# Patient Record
Sex: Female | Born: 1950
Health system: Southern US, Community
[De-identification: ages and names within clinical notes are randomized; demographics above are authoritative.]

## PROBLEM LIST (undated history)

## (undated) DIAGNOSIS — I87309 Chronic venous hypertension (idiopathic) without complications of unspecified lower extremity: Secondary | ICD-10-CM

## (undated) DIAGNOSIS — F3289 Other specified depressive episodes: Secondary | ICD-10-CM

## (undated) DIAGNOSIS — I10 Essential (primary) hypertension: Secondary | ICD-10-CM

## (undated) DIAGNOSIS — I4819 Other persistent atrial fibrillation: Secondary | ICD-10-CM

## (undated) DIAGNOSIS — T451X5A Adverse effect of antineoplastic and immunosuppressive drugs, initial encounter: Secondary | ICD-10-CM

## (undated) DIAGNOSIS — S82891A Other fracture of right lower leg, initial encounter for closed fracture: Secondary | ICD-10-CM

## (undated) DIAGNOSIS — R112 Nausea with vomiting, unspecified: Secondary | ICD-10-CM

## (undated) DIAGNOSIS — N289 Disorder of kidney and ureter, unspecified: Secondary | ICD-10-CM

## (undated) DIAGNOSIS — I5032 Chronic diastolic (congestive) heart failure: Secondary | ICD-10-CM

## (undated) DIAGNOSIS — J45909 Unspecified asthma, uncomplicated: Secondary | ICD-10-CM

## (undated) DIAGNOSIS — E669 Obesity, unspecified: Secondary | ICD-10-CM

## (undated) DIAGNOSIS — D869 Sarcoidosis, unspecified: Secondary | ICD-10-CM

## (undated) DIAGNOSIS — R911 Solitary pulmonary nodule: Secondary | ICD-10-CM

## (undated) DIAGNOSIS — D649 Anemia, unspecified: Secondary | ICD-10-CM

## (undated) DIAGNOSIS — E039 Hypothyroidism, unspecified: Secondary | ICD-10-CM

## (undated) DIAGNOSIS — K589 Irritable bowel syndrome without diarrhea: Secondary | ICD-10-CM

## (undated) DIAGNOSIS — K5730 Diverticulosis of large intestine without perforation or abscess without bleeding: Secondary | ICD-10-CM

## (undated) DIAGNOSIS — M109 Gout, unspecified: Secondary | ICD-10-CM

## (undated) DIAGNOSIS — E785 Hyperlipidemia, unspecified: Secondary | ICD-10-CM

## (undated) DIAGNOSIS — Z91199 Patient's noncompliance with other medical treatment and regimen due to unspecified reason: Secondary | ICD-10-CM

## (undated) DIAGNOSIS — Z90710 Acquired absence of both cervix and uterus: Secondary | ICD-10-CM

## (undated) DIAGNOSIS — G479 Sleep disorder, unspecified: Secondary | ICD-10-CM

## (undated) DIAGNOSIS — E1169 Type 2 diabetes mellitus with other specified complication: Secondary | ICD-10-CM

## (undated) DIAGNOSIS — K759 Inflammatory liver disease, unspecified: Secondary | ICD-10-CM

## (undated) DIAGNOSIS — F329 Major depressive disorder, single episode, unspecified: Secondary | ICD-10-CM

## (undated) DIAGNOSIS — I2510 Atherosclerotic heart disease of native coronary artery without angina pectoris: Secondary | ICD-10-CM

## (undated) DIAGNOSIS — Z9889 Other specified postprocedural states: Secondary | ICD-10-CM

## (undated) DIAGNOSIS — Z9119 Patient's noncompliance with other medical treatment and regimen: Secondary | ICD-10-CM

## (undated) DIAGNOSIS — K635 Polyp of colon: Secondary | ICD-10-CM

## (undated) HISTORY — DX: Polyp of colon: K63.5

## (undated) HISTORY — DX: Patient's noncompliance with other medical treatment and regimen due to unspecified reason: Z91.199

## (undated) HISTORY — DX: Unspecified asthma, uncomplicated: J45.909

## (undated) HISTORY — DX: Chronic venous hypertension (idiopathic) without complications of unspecified lower extremity: I87.309

## (undated) HISTORY — DX: Other fracture of right lower leg, initial encounter for closed fracture: S82.891A

## (undated) HISTORY — DX: Adverse effect of antineoplastic and immunosuppressive drugs, initial encounter: T45.1X5A

## (undated) HISTORY — DX: Irritable bowel syndrome, unspecified: K58.9

## (undated) HISTORY — PX: CHOLECYSTECTOMY: SHX55

## (undated) HISTORY — PX: CARDIOVERSION: SHX1299

## (undated) HISTORY — DX: Sarcoidosis, unspecified: D86.9

## (undated) HISTORY — DX: Atherosclerotic heart disease of native coronary artery without angina pectoris: I25.10

## (undated) HISTORY — DX: Disorder of kidney and ureter, unspecified: N28.9

## (undated) HISTORY — DX: Chronic diastolic (congestive) heart failure: I50.32

## (undated) HISTORY — DX: Essential (primary) hypertension: I10

## (undated) HISTORY — DX: Other specified depressive episodes: F32.89

## (undated) HISTORY — DX: Hyperlipidemia, unspecified: E78.5

## (undated) HISTORY — DX: Anemia, unspecified: D64.9

## (undated) HISTORY — DX: Solitary pulmonary nodule: R91.1

## (undated) HISTORY — PX: ABDOMINAL HYSTERECTOMY: SHX81

## (undated) HISTORY — DX: Obesity, unspecified: E66.9

## (undated) HISTORY — PX: COLONOSCOPY: SHX174

## (undated) HISTORY — DX: Acquired absence of both cervix and uterus: Z90.710

## (undated) HISTORY — DX: Inflammatory liver disease, unspecified: K75.9

## (undated) HISTORY — DX: Gout, unspecified: M10.9

## (undated) HISTORY — DX: Diverticulosis of large intestine without perforation or abscess without bleeding: K57.30

## (undated) HISTORY — DX: Sleep disorder, unspecified: G47.9

## (undated) HISTORY — DX: Patient's noncompliance with other medical treatment and regimen: Z91.19

## (undated) HISTORY — DX: Major depressive disorder, single episode, unspecified: F32.9

## (undated) HISTORY — DX: Other persistent atrial fibrillation: I48.19

---

## 1998-08-11 ENCOUNTER — Emergency Department (HOSPITAL_COMMUNITY): Admission: EM | Admit: 1998-08-11 | Discharge: 1998-08-11 | Payer: Self-pay | Admitting: Emergency Medicine

## 1998-10-19 HISTORY — PX: MYOMECTOMY: SHX85

## 1999-08-04 ENCOUNTER — Encounter (INDEPENDENT_AMBULATORY_CARE_PROVIDER_SITE_OTHER): Payer: Self-pay

## 1999-08-04 ENCOUNTER — Other Ambulatory Visit: Admission: RE | Admit: 1999-08-04 | Discharge: 1999-08-04 | Payer: Self-pay | Admitting: *Deleted

## 1999-09-30 ENCOUNTER — Other Ambulatory Visit: Admission: RE | Admit: 1999-09-30 | Discharge: 1999-09-30 | Payer: Self-pay | Admitting: Obstetrics and Gynecology

## 1999-11-12 ENCOUNTER — Inpatient Hospital Stay (HOSPITAL_COMMUNITY): Admission: AD | Admit: 1999-11-12 | Discharge: 1999-11-12 | Payer: Self-pay | Admitting: Obstetrics and Gynecology

## 2000-04-19 ENCOUNTER — Emergency Department (HOSPITAL_COMMUNITY): Admission: EM | Admit: 2000-04-19 | Discharge: 2000-04-20 | Payer: Self-pay | Admitting: Emergency Medicine

## 2000-04-20 ENCOUNTER — Encounter: Payer: Self-pay | Admitting: Emergency Medicine

## 2000-10-19 DIAGNOSIS — Z90710 Acquired absence of both cervix and uterus: Secondary | ICD-10-CM

## 2000-10-19 HISTORY — DX: Acquired absence of both cervix and uterus: Z90.710

## 2000-11-19 ENCOUNTER — Encounter: Payer: Self-pay | Admitting: Family Medicine

## 2000-11-19 ENCOUNTER — Encounter: Admission: RE | Admit: 2000-11-19 | Discharge: 2000-11-19 | Payer: Self-pay | Admitting: Family Medicine

## 2000-12-31 ENCOUNTER — Other Ambulatory Visit: Admission: RE | Admit: 2000-12-31 | Discharge: 2000-12-31 | Payer: Self-pay | Admitting: Obstetrics and Gynecology

## 2001-01-03 ENCOUNTER — Other Ambulatory Visit: Admission: RE | Admit: 2001-01-03 | Discharge: 2001-01-03 | Payer: Self-pay | Admitting: Obstetrics and Gynecology

## 2001-02-01 ENCOUNTER — Encounter (INDEPENDENT_AMBULATORY_CARE_PROVIDER_SITE_OTHER): Payer: Self-pay | Admitting: Specialist

## 2001-02-01 ENCOUNTER — Other Ambulatory Visit: Admission: RE | Admit: 2001-02-01 | Discharge: 2001-02-01 | Payer: Self-pay | Admitting: Obstetrics and Gynecology

## 2001-02-15 ENCOUNTER — Encounter (INDEPENDENT_AMBULATORY_CARE_PROVIDER_SITE_OTHER): Payer: Self-pay | Admitting: Specialist

## 2001-02-15 ENCOUNTER — Inpatient Hospital Stay (HOSPITAL_COMMUNITY): Admission: RE | Admit: 2001-02-15 | Discharge: 2001-02-17 | Payer: Self-pay | Admitting: Obstetrics and Gynecology

## 2001-07-01 ENCOUNTER — Ambulatory Visit (HOSPITAL_COMMUNITY): Admission: RE | Admit: 2001-07-01 | Discharge: 2001-07-01 | Payer: Self-pay | Admitting: Family Medicine

## 2001-07-01 ENCOUNTER — Encounter: Payer: Self-pay | Admitting: Family Medicine

## 2002-09-25 ENCOUNTER — Encounter: Payer: Self-pay | Admitting: Family Medicine

## 2002-09-25 ENCOUNTER — Ambulatory Visit (HOSPITAL_COMMUNITY): Admission: RE | Admit: 2002-09-25 | Discharge: 2002-09-25 | Payer: Self-pay | Admitting: Family Medicine

## 2004-03-12 ENCOUNTER — Emergency Department (HOSPITAL_COMMUNITY): Admission: EM | Admit: 2004-03-12 | Discharge: 2004-03-12 | Payer: Self-pay | Admitting: Emergency Medicine

## 2005-01-05 ENCOUNTER — Emergency Department (HOSPITAL_COMMUNITY): Admission: EM | Admit: 2005-01-05 | Discharge: 2005-01-05 | Payer: Self-pay | Admitting: Emergency Medicine

## 2005-03-29 ENCOUNTER — Emergency Department (HOSPITAL_COMMUNITY): Admission: EM | Admit: 2005-03-29 | Discharge: 2005-03-29 | Payer: Self-pay | Admitting: *Deleted

## 2005-09-21 ENCOUNTER — Ambulatory Visit (HOSPITAL_COMMUNITY): Admission: RE | Admit: 2005-09-21 | Discharge: 2005-09-21 | Payer: Self-pay | Admitting: Family Medicine

## 2006-02-17 ENCOUNTER — Encounter: Payer: Self-pay | Admitting: Emergency Medicine

## 2006-12-06 ENCOUNTER — Encounter: Admission: RE | Admit: 2006-12-06 | Discharge: 2006-12-06 | Payer: Self-pay | Admitting: Family Medicine

## 2007-07-11 ENCOUNTER — Emergency Department (HOSPITAL_COMMUNITY): Admission: EM | Admit: 2007-07-11 | Discharge: 2007-07-11 | Payer: Self-pay | Admitting: Family Medicine

## 2007-10-20 DIAGNOSIS — K5730 Diverticulosis of large intestine without perforation or abscess without bleeding: Secondary | ICD-10-CM

## 2007-10-20 DIAGNOSIS — K635 Polyp of colon: Secondary | ICD-10-CM

## 2007-10-20 HISTORY — PX: HERNIA REPAIR: SHX51

## 2007-10-20 HISTORY — DX: Polyp of colon: K63.5

## 2007-10-20 HISTORY — DX: Diverticulosis of large intestine without perforation or abscess without bleeding: K57.30

## 2007-11-21 ENCOUNTER — Ambulatory Visit: Payer: Self-pay | Admitting: Gastroenterology

## 2007-11-21 DIAGNOSIS — F329 Major depressive disorder, single episode, unspecified: Secondary | ICD-10-CM

## 2007-11-21 DIAGNOSIS — K589 Irritable bowel syndrome without diarrhea: Secondary | ICD-10-CM | POA: Insufficient documentation

## 2007-11-21 DIAGNOSIS — F3289 Other specified depressive episodes: Secondary | ICD-10-CM | POA: Insufficient documentation

## 2007-11-21 DIAGNOSIS — K759 Inflammatory liver disease, unspecified: Secondary | ICD-10-CM | POA: Insufficient documentation

## 2007-11-21 DIAGNOSIS — I87309 Chronic venous hypertension (idiopathic) without complications of unspecified lower extremity: Secondary | ICD-10-CM | POA: Insufficient documentation

## 2007-11-21 LAB — CONVERTED CEMR LAB
ALT: 22 units/L (ref 0–35)
AST: 21 units/L (ref 0–37)
Albumin: 3.7 g/dL (ref 3.5–5.2)
Alkaline Phosphatase: 69 units/L (ref 39–117)
Bilirubin, Direct: 0.1 mg/dL (ref 0.0–0.3)
HCV Ab: NEGATIVE

## 2007-12-26 ENCOUNTER — Ambulatory Visit: Payer: Self-pay | Admitting: Gastroenterology

## 2007-12-26 ENCOUNTER — Encounter: Payer: Self-pay | Admitting: Gastroenterology

## 2007-12-26 LAB — HM COLONOSCOPY

## 2008-01-02 ENCOUNTER — Inpatient Hospital Stay (HOSPITAL_COMMUNITY): Admission: RE | Admit: 2008-01-02 | Discharge: 2008-01-04 | Payer: Self-pay | Admitting: General Surgery

## 2008-10-20 ENCOUNTER — Emergency Department (HOSPITAL_COMMUNITY): Admission: EM | Admit: 2008-10-20 | Discharge: 2008-10-20 | Payer: Self-pay | Admitting: Family Medicine

## 2008-11-03 ENCOUNTER — Emergency Department (HOSPITAL_COMMUNITY): Admission: EM | Admit: 2008-11-03 | Discharge: 2008-11-03 | Payer: Self-pay | Admitting: Family Medicine

## 2009-08-01 ENCOUNTER — Inpatient Hospital Stay (HOSPITAL_COMMUNITY): Admission: EM | Admit: 2009-08-01 | Discharge: 2009-08-05 | Payer: Self-pay | Admitting: Emergency Medicine

## 2009-08-01 ENCOUNTER — Ambulatory Visit: Payer: Self-pay | Admitting: Internal Medicine

## 2009-08-02 ENCOUNTER — Encounter (INDEPENDENT_AMBULATORY_CARE_PROVIDER_SITE_OTHER): Payer: Self-pay | Admitting: Internal Medicine

## 2009-08-20 ENCOUNTER — Telehealth (INDEPENDENT_AMBULATORY_CARE_PROVIDER_SITE_OTHER): Payer: Self-pay | Admitting: *Deleted

## 2009-08-20 DIAGNOSIS — E669 Obesity, unspecified: Secondary | ICD-10-CM | POA: Insufficient documentation

## 2009-08-20 DIAGNOSIS — E785 Hyperlipidemia, unspecified: Secondary | ICD-10-CM | POA: Insufficient documentation

## 2009-08-20 DIAGNOSIS — D649 Anemia, unspecified: Secondary | ICD-10-CM | POA: Insufficient documentation

## 2009-08-20 DIAGNOSIS — D869 Sarcoidosis, unspecified: Secondary | ICD-10-CM | POA: Insufficient documentation

## 2009-08-20 DIAGNOSIS — I4891 Unspecified atrial fibrillation: Secondary | ICD-10-CM | POA: Insufficient documentation

## 2009-08-20 DIAGNOSIS — R911 Solitary pulmonary nodule: Secondary | ICD-10-CM | POA: Insufficient documentation

## 2009-08-20 DIAGNOSIS — R0602 Shortness of breath: Secondary | ICD-10-CM | POA: Insufficient documentation

## 2009-08-21 ENCOUNTER — Encounter (HOSPITAL_COMMUNITY): Admission: RE | Admit: 2009-08-21 | Discharge: 2009-10-15 | Payer: Self-pay | Admitting: Internal Medicine

## 2009-08-21 ENCOUNTER — Encounter: Payer: Self-pay | Admitting: Cardiology

## 2009-08-21 ENCOUNTER — Ambulatory Visit: Payer: Self-pay | Admitting: Cardiovascular Disease

## 2009-08-21 ENCOUNTER — Ambulatory Visit: Payer: Self-pay

## 2009-08-21 DIAGNOSIS — R9439 Abnormal result of other cardiovascular function study: Secondary | ICD-10-CM | POA: Insufficient documentation

## 2009-08-21 DIAGNOSIS — R079 Chest pain, unspecified: Secondary | ICD-10-CM | POA: Insufficient documentation

## 2009-08-22 ENCOUNTER — Inpatient Hospital Stay (HOSPITAL_BASED_OUTPATIENT_CLINIC_OR_DEPARTMENT_OTHER): Admission: RE | Admit: 2009-08-22 | Discharge: 2009-08-22 | Payer: Self-pay | Admitting: Cardiology

## 2009-08-22 ENCOUNTER — Ambulatory Visit: Payer: Self-pay | Admitting: Cardiology

## 2009-08-23 LAB — CONVERTED CEMR LAB
Basophils Relative: 0.6 % (ref 0.0–3.0)
CO2: 28 meq/L (ref 19–32)
Calcium: 9.7 mg/dL (ref 8.4–10.5)
Chloride: 101 meq/L (ref 96–112)
Eosinophils Absolute: 0.1 10*3/uL (ref 0.0–0.7)
HCT: 32.6 % — ABNORMAL LOW (ref 36.0–46.0)
Hemoglobin: 11.2 g/dL — ABNORMAL LOW (ref 12.0–15.0)
Lymphocytes Relative: 46.9 % — ABNORMAL HIGH (ref 12.0–46.0)
Lymphs Abs: 2.8 10*3/uL (ref 0.7–4.0)
MCHC: 34.4 g/dL (ref 30.0–36.0)
Neutro Abs: 2.7 10*3/uL (ref 1.4–7.7)
Potassium: 4 meq/L (ref 3.5–5.1)
RBC: 3.38 M/uL — ABNORMAL LOW (ref 3.87–5.11)
Sodium: 137 meq/L (ref 135–145)
aPTT: 26.3 s (ref 21.7–28.8)

## 2009-08-31 ENCOUNTER — Emergency Department (HOSPITAL_COMMUNITY): Admission: EM | Admit: 2009-08-31 | Discharge: 2009-08-31 | Payer: Self-pay | Admitting: Emergency Medicine

## 2009-09-02 ENCOUNTER — Ambulatory Visit: Payer: Self-pay | Admitting: Physician Assistant

## 2009-09-02 DIAGNOSIS — I10 Essential (primary) hypertension: Secondary | ICD-10-CM | POA: Insufficient documentation

## 2009-09-02 DIAGNOSIS — F411 Generalized anxiety disorder: Secondary | ICD-10-CM | POA: Insufficient documentation

## 2009-09-02 DIAGNOSIS — E739 Lactose intolerance, unspecified: Secondary | ICD-10-CM | POA: Insufficient documentation

## 2009-09-02 LAB — CONVERTED CEMR LAB
Bilirubin Urine: NEGATIVE
Glucose, Urine, Semiquant: NEGATIVE
Hgb A1c MFr Bld: 6.1 %
Protein, U semiquant: NEGATIVE
Specific Gravity, Urine: >=1.03
pH: 5

## 2009-09-03 ENCOUNTER — Telehealth: Payer: Self-pay | Admitting: Physician Assistant

## 2009-09-03 ENCOUNTER — Encounter: Payer: Self-pay | Admitting: Physician Assistant

## 2009-09-06 ENCOUNTER — Telehealth (INDEPENDENT_AMBULATORY_CARE_PROVIDER_SITE_OTHER): Payer: Self-pay | Admitting: *Deleted

## 2009-09-09 ENCOUNTER — Encounter: Payer: Self-pay | Admitting: Internal Medicine

## 2009-09-09 ENCOUNTER — Encounter: Payer: Self-pay | Admitting: Physician Assistant

## 2009-09-09 LAB — CONVERTED CEMR LAB
Basophils Absolute: 0 10*3/uL (ref 0.0–0.1)
Basophils Relative: 0 % (ref 0–1)
MCHC: 32.3 g/dL (ref 30.0–36.0)
Neutro Abs: 3.2 10*3/uL (ref 1.7–7.7)
Neutrophils Relative %: 45 % (ref 43–77)
Platelets: 246 10*3/uL (ref 150–400)
RDW: 14.3 % (ref 11.5–15.5)
Retic Ct Pct: 2.4 % (ref 0.4–3.1)
TIBC: 377 ug/dL (ref 250–470)
UIBC: 315 ug/dL

## 2009-09-10 ENCOUNTER — Ambulatory Visit: Payer: Self-pay | Admitting: Internal Medicine

## 2009-09-24 ENCOUNTER — Telehealth (INDEPENDENT_AMBULATORY_CARE_PROVIDER_SITE_OTHER): Payer: Self-pay | Admitting: *Deleted

## 2009-09-25 LAB — CONVERTED CEMR LAB

## 2009-09-27 ENCOUNTER — Ambulatory Visit: Payer: Self-pay | Admitting: Physician Assistant

## 2009-09-27 DIAGNOSIS — I5032 Chronic diastolic (congestive) heart failure: Secondary | ICD-10-CM | POA: Insufficient documentation

## 2009-10-01 ENCOUNTER — Telehealth: Payer: Self-pay | Admitting: Physician Assistant

## 2009-10-09 ENCOUNTER — Telehealth: Payer: Self-pay | Admitting: Physician Assistant

## 2009-10-23 ENCOUNTER — Ambulatory Visit: Payer: Self-pay | Admitting: Physician Assistant

## 2009-10-23 DIAGNOSIS — K219 Gastro-esophageal reflux disease without esophagitis: Secondary | ICD-10-CM | POA: Insufficient documentation

## 2009-10-23 LAB — CONVERTED CEMR LAB
CO2: 24 meq/L (ref 19–32)
Chloride: 102 meq/L (ref 96–112)
Creatinine, Ser: 0.92 mg/dL (ref 0.40–1.20)
Eosinophils Absolute: 0.1 10*3/uL (ref 0.0–0.7)
HCT: 37.9 % (ref 36.0–46.0)
Hgb A2 Quant: 2.5 % (ref 2.2–3.2)
Hgb A: 97.5 % (ref 96.8–97.8)
Hgb F Quant: 0 % (ref 0.0–2.0)
Lymphs Abs: 2.2 10*3/uL (ref 0.7–4.0)
MCV: 95 fL (ref 78.0–100.0)
Monocytes Relative: 6 % (ref 3–12)
Neutrophils Relative %: 52 % (ref 43–77)
Potassium: 4.1 meq/L (ref 3.5–5.3)
Pro B Natriuretic peptide (BNP): 16 pg/mL (ref 0.0–100.0)
RBC: 3.99 M/uL (ref 3.87–5.11)
WBC: 5.4 10*3/uL (ref 4.0–10.5)

## 2009-10-25 ENCOUNTER — Telehealth: Payer: Self-pay | Admitting: Physician Assistant

## 2009-10-25 ENCOUNTER — Encounter: Payer: Self-pay | Admitting: Physician Assistant

## 2009-10-25 ENCOUNTER — Ambulatory Visit (HOSPITAL_COMMUNITY): Admission: RE | Admit: 2009-10-25 | Discharge: 2009-10-25 | Payer: Self-pay | Admitting: Internal Medicine

## 2009-10-25 ENCOUNTER — Encounter: Payer: Self-pay | Admitting: Pulmonary Disease

## 2009-10-25 DIAGNOSIS — J189 Pneumonia, unspecified organism: Secondary | ICD-10-CM | POA: Insufficient documentation

## 2009-11-06 ENCOUNTER — Ambulatory Visit: Payer: Self-pay | Admitting: Physician Assistant

## 2009-11-06 DIAGNOSIS — J45909 Unspecified asthma, uncomplicated: Secondary | ICD-10-CM | POA: Insufficient documentation

## 2009-11-14 ENCOUNTER — Telehealth: Payer: Self-pay | Admitting: Physician Assistant

## 2009-11-15 ENCOUNTER — Encounter: Payer: Self-pay | Admitting: Physician Assistant

## 2009-11-15 ENCOUNTER — Ambulatory Visit (HOSPITAL_COMMUNITY): Admission: RE | Admit: 2009-11-15 | Discharge: 2009-11-15 | Payer: Self-pay | Admitting: Physician Assistant

## 2009-11-29 ENCOUNTER — Telehealth: Payer: Self-pay | Admitting: Physician Assistant

## 2010-01-20 ENCOUNTER — Telehealth: Payer: Self-pay | Admitting: Physician Assistant

## 2010-01-20 ENCOUNTER — Ambulatory Visit: Payer: Self-pay | Admitting: Physician Assistant

## 2010-03-03 ENCOUNTER — Ambulatory Visit: Payer: Self-pay | Admitting: Pulmonary Disease

## 2010-03-03 DIAGNOSIS — G4719 Other hypersomnia: Secondary | ICD-10-CM | POA: Insufficient documentation

## 2010-04-22 ENCOUNTER — Encounter: Payer: Self-pay | Admitting: Physician Assistant

## 2010-07-09 ENCOUNTER — Emergency Department (HOSPITAL_COMMUNITY): Admission: EM | Admit: 2010-07-09 | Discharge: 2010-07-09 | Payer: Self-pay | Admitting: Emergency Medicine

## 2010-07-29 ENCOUNTER — Emergency Department (HOSPITAL_COMMUNITY): Admission: EM | Admit: 2010-07-29 | Discharge: 2010-07-29 | Payer: Self-pay | Admitting: Emergency Medicine

## 2010-07-29 ENCOUNTER — Emergency Department (HOSPITAL_COMMUNITY): Admission: EM | Admit: 2010-07-29 | Discharge: 2010-07-29 | Payer: Self-pay | Admitting: Family Medicine

## 2010-07-30 ENCOUNTER — Telehealth: Payer: Self-pay | Admitting: Cardiology

## 2010-08-25 ENCOUNTER — Encounter (INDEPENDENT_AMBULATORY_CARE_PROVIDER_SITE_OTHER): Payer: Self-pay | Admitting: *Deleted

## 2010-08-29 ENCOUNTER — Telehealth (INDEPENDENT_AMBULATORY_CARE_PROVIDER_SITE_OTHER): Payer: Self-pay | Admitting: Nurse Practitioner

## 2010-08-29 ENCOUNTER — Ambulatory Visit: Payer: Self-pay | Admitting: Physician Assistant

## 2010-09-05 ENCOUNTER — Encounter (INDEPENDENT_AMBULATORY_CARE_PROVIDER_SITE_OTHER): Payer: Self-pay | Admitting: *Deleted

## 2010-09-09 LAB — CONVERTED CEMR LAB
BUN: 21 mg/dL (ref 6–23)
Calcium: 9.3 mg/dL (ref 8.4–10.5)
Creatinine, Ser: 1 mg/dL (ref 0.4–1.2)
GFR calc non Af Amer: 74.91 mL/min (ref >60)
Glucose, Bld: 100 mg/dL — ABNORMAL HIGH (ref 70–99)
Sodium: 142 meq/L (ref 135–145)

## 2010-09-17 ENCOUNTER — Ambulatory Visit: Payer: Self-pay | Admitting: Physician Assistant

## 2010-09-22 ENCOUNTER — Ambulatory Visit: Payer: Self-pay | Admitting: Internal Medicine

## 2010-09-22 ENCOUNTER — Encounter: Payer: Self-pay | Admitting: Physician Assistant

## 2010-09-25 LAB — CONVERTED CEMR LAB
BUN: 22 mg/dL (ref 6–23)
Calcium: 9.4 mg/dL (ref 8.4–10.5)
Creatinine, Ser: 1 mg/dL (ref 0.4–1.2)
Pro B Natriuretic peptide (BNP): 158.7 pg/mL — ABNORMAL HIGH (ref 0.0–100.0)

## 2010-11-11 ENCOUNTER — Ambulatory Visit: Admit: 2010-11-11 | Payer: Self-pay | Admitting: Internal Medicine

## 2010-11-18 NOTE — Progress Notes (Signed)
Summary: Office Visit/DEPRESSION SCREENING  Office Visit/DEPRESSION SCREENING   Imported By: Arta Bruce 10/22/2009 15:00:55  _____________________________________________________________________  External Attachment:    Type:   Image     Comment:   External Document

## 2010-11-18 NOTE — Assessment & Plan Note (Signed)
Summary: sarcoid/jd   Visit Type:  Initial Consult Copy to:  pcp Primary Provider/Referring Provider:  Tereso Newcomer PA-C  CC:  Pt here for pulmonary consult. Pt c/o history of PNA. 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 She was diagnosed with sarcoidosis in 1984 by an ophthalmologist in [place], [place] when she presented with photophobia & uveitis. She was on prednisone x 1 yr then off. She was treated with pneumonia in 10/10, FU in 1/11 showed 12 mm RLL nodule resolved & small patch of pneumonitis in RUL. Lisinopril was stopped in 4/11. Spirometry  11/15/09 showed nml lung function. She had nml cath in 10/10 & required DCCV for atrial fibrillation, is currently maintaining nSR on cardizem & flecainide. She denies chest pain, palpitations, cough . She reports recent onset snoring , hot flashes & insomnia for which she is taking xanax & nonrefreshing sleep. No witnessed apneas   Preventive Screening-Counseling & Management  Alcohol-Tobacco     Smoking Status: never  Current Medications (verified): 1)  Furosemide 40 Mg Tabs (Furosemide) .... Take One Daily 2)  Simvastatin 20 Mg Tabs (Simvastatin) .... Take One Half Daily 3)  Cardizem Cd 360 Mg Xr24h-Cap (Diltiazem Hcl Coated Beads) .... Take 1 Tablet By Mouth Once A Day 4)  Tambocor 100 Mg Tabs (Flecainide Acetate) .... Take One Every 12 Hours 5)  Pepcid 20 Mg Tabs (Famotidine) .... Take 1 Tablet By Mouth Once A Day 6)  Allegra 180 Mg Tabs (Fexofenadine Hcl) .... Take 1 Tablet By Mouth Once A Day For Allergies 7)  Alprazolam 0.25 Mg Tabs (Alprazolam) .... Take 1 Tablet By Mouth Two Times A Day As Needed For Anxiety 8)  Diovan 160 Mg Tabs (Valsartan) .... Take 1 Tablet By Mouth Once A Day For Blood Pressure  Allergies (verified): 1)  ! Darvocet 2)  ! Lipitor  Past History:  Past Medical History: Last updated: 01/20/2010 abnormal Myoview scan with anterior  ischemia  cardiac catheterization 10/10 no obstructive coronary disease CHF (ICD-428.0)    a.  normal LVF by echo 07/2009 with mild LVH (?diastolic CHF) ATRIAL FIBRILLATION (ICD-427.31)    a.  s/p DCCV 07/2009 DYSLIPIDEMIA (ICD-272.4) CHRONIC VENOUS HYPERTENSION WITHOUT COMPS (ICD-459.30) SARCOIDOSIS (ICD-135)    a.  dx by eye exam in past OBESITY (ICD-278.00) ANEMIA (ICD-285.9) LUNG NODULE (ICD-518.89)    a. 12mm RLL nodule on chest CT 07/2009    b.  needs f/u chest CT January 2011 . . . resolved on f/u CT I B S-DIARRHEAL PREDOMINATE (ICD-564.1) Hx of UNSPECIFIED HEPATITIS (ICD-573.3) DEPRESSIVE DISORDER NOT ELSEWHERE CLASSIFIED (ICD-311) ? Chronic bronchitis.  Colon polyps    a.  had colonoscopy with Dr. Arlyce Dice in 2009 (patient told to have f/u in 2012) Anemia Hypertension Glucose intolerance (hgb A1C 6.1 09/02/2009) PFTs 11/15/2009:  FEV1 95; FEV1/FVC 83; (Normal airflow, no change with bronchodilator)  Past Surgical History: Last updated: 08/20/2009 Cholecystectomy   DC cardioversion x2.    Hernia repair 2009.    Hysterectomy 2002,    History right ankle fractures x2.  Family History:  Father died of pneumonia.  Mother died with cancer and   had a history of multiple sclerosis.  She states that emphysema runs in  her family.     Family History Hypertension (father)  Social History: The patient is divorced.  She works as a Proofreader.  No tobacco. Substantiall alcohol intake, sometimes 1 bottle wine/day.  No recreational  drugs.   Today (09/02/2009), patient notes 2-3 drinks 1-2 x per week. Patient never smoked.   Review of Systems       The patient complains of shortness of breath with activity, irregular heartbeats, weight change, abdominal pain, anxiety, depression, hand/feet swelling, and joint stiffness or pain.  The patient denies shortness of breath at rest, productive cough, non-productive cough, coughing up blood, chest pain, acid heartburn,  indigestion, loss of appetite, difficulty swallowing, sore throat, tooth/dental problems, headaches, nasal congestion/difficulty breathing through nose, sneezing, itching, ear ache, rash, change in color of mucus, and fever.    Vital Signs:  Patient profile:   60 year old female Height:      65 inches Weight:      260 pounds O2 Sat:      97 % on Room air Temp:     98.1 degrees F oral Pulse rate:   69 / minute BP sitting:   158 / 100  (right arm) Cuff size:   large  Vitals Entered By: Zackery Barefoot CMA (Mar 03, 2010 4:31 PM)  O2 Flow:  Room air CC: Pt here for pulmonary consult. Pt c/o history of PNA.  Comments Medications reviewed with patient Verified contact number and pharmacy with patient Zackery Barefoot CMA  Mar 03, 2010 4:31 PM    Physical Exam  Additional Exam:  Gen. Pleasant, well-nourished, in no distress, normal affect ENT - no lesions, no post nasal drip, class 2 airway Neck: No JVD, no thyromegaly, no carotid bruits Lungs: no use of accessory muscles, no dullness to percussion, clear without rales or rhonchi  Cardiovascular: Rhythm regular, heart sounds  normal, no murmurs or gallops, no peripheral edema Abdomen: soft and non-tender, no hepatosplenomegaly, BS normal. Musculoskeletal: No deformities, no cyanosis or clubbing Neuro:  alert, non focal     Impression & Recommendations:  Problem # 1:  SARCOIDOSIS (ICD-135) With nml lung function & imaging , does not need treatment.  Problem # 2:  REACTIVE AIRWAY DISEASE (ICD-493.90) likley related to lisinopril - agree with stopping this & staying off.  Problem # 3:  HYPERSOMNIA, TRANSIENT (ICD-307.43)  may be related to menopause or true obstructive sleep apnea  - discussed warning signs to seek evaluation  Orders: Consultation Level IV (95621)  Medications Added to Medication List This Visit: 1)  Pepcid 20 Mg Tabs (Famotidine) .... Take 1 tablet by mouth once a day  Patient Instructions: 1)  Please  schedule a follow-up appointment as needed. 2)  We discussed warning signs of obstructive sleep apnea  3)  You do not need treatment for sarcoidosis

## 2010-11-18 NOTE — Progress Notes (Signed)
Summary: Office Visit//DEPRESSION SCREENING  Office Visit//DEPRESSION SCREENING   Imported By: Arta Bruce 01/02/2010 15:00:15  _____________________________________________________________________  External Attachment:    Type:   Image     Comment:   External Document

## 2010-11-18 NOTE — Assessment & Plan Note (Signed)
Summary: f3w per check out on 11/11/lg  Medications Added PEPCID 20 MG TABS (FAMOTIDINE) Take 1 tablet by mouth once a day as needed DILTIAZEM HCL ER BEADS 180 MG XR24H-CAP (DILTIAZEM HCL ER BEADS) Take one capsule by mouth daily ALPRAZOLAM 0.25 MG TABS (ALPRAZOLAM) pt states since dose is so low she takes 2-3 tabs at bedtime as needed        Visit Type:  3 wk f/u Referring Provider:  pcp Primary Provider:  Tereso Newcomer PA-C  CC:  sob...edema.....  History of Present Illness: Primary Electrophysiologist:  Dr. Sherryl Manges  Stephanie [name] is a 60 year old female who is a prior patient of mine from [facility] with a history of atrial fibrillation, hypertension, diastolic heart failure, normal coronary arteries by cardiac catheterization in November 2010 who was recently seen in the emergency room.  She was in atrial fibrillation with rapid ventricular rate.  She was also noted to be in mild diastolic congestive heart failure.  She was treated with p.o. diuretics.  She was previously placed on flecainide, but has not been taking any of her medications due to cost.  I saw her on Nov. 11 and restarted her diltiazem and increased her furosemide. She returns for followup.    She is feeling better.  Her breathing is better.  She denies orthopnea or PND.  She notes her palpitations are less frequent.  She is not having chest pain.  She is requesting xanax again.  Anxiety is a big problem for her.  She is having [facility].  She has been told to contact them for a refill.  Her  BP looks much better.  She admits to compliance with her medications.     Current Medications (verified): 1)  Furosemide 40 Mg Tabs (Furosemide) .... Take One Daily 2)  Pepcid 20 Mg Tabs (Famotidine) .... Take 1 Tablet By Mouth Once A Day As Needed 3)  Dilt-Cd 120 Mg Xr24h-Cap (Diltiazem Hcl Coated Beads) .Marland Kitchen.. 1 Cap Once Daily 4)  Alprazolam 0.25 Mg Tabs (Alprazolam) .... Pt States Since Dose Is So Low  She Takes 2-3 Tabs At Bedtime As Needed  Allergies: 1)  ! Darvocet 2)  ! Lipitor  Past History:  Past Medical History: Last updated: 01/20/2010 abnormal Myoview scan with anterior ischemia  cardiac catheterization 10/10 no obstructive coronary disease CHF (ICD-428.0)    a.  normal LVF by echo 07/2009 with mild LVH (?diastolic CHF) ATRIAL FIBRILLATION (ICD-427.31)    a.  s/p DCCV 07/2009 DYSLIPIDEMIA (ICD-272.4) CHRONIC VENOUS HYPERTENSION WITHOUT COMPS (ICD-459.30) SARCOIDOSIS (ICD-135)    a.  dx by eye exam in past OBESITY (ICD-278.00) ANEMIA (ICD-285.9) LUNG NODULE (ICD-518.89)    a. 12mm RLL nodule on chest CT 07/2009    b.  needs f/u chest CT January 2011 . . . resolved on f/u CT I B S-DIARRHEAL PREDOMINATE (ICD-564.1) Hx of UNSPECIFIED HEPATITIS (ICD-573.3) DEPRESSIVE DISORDER NOT ELSEWHERE CLASSIFIED (ICD-311) ? Chronic bronchitis.  Colon polyps    a.  had colonoscopy with Dr. Arlyce Dice in 2009 (patient told to have f/u in 2012) Anemia Hypertension Glucose intolerance (hgb A1C 6.1 09/02/2009) PFTs 11/15/2009:  FEV1 95; FEV1/FVC 83; (Normal airflow, no change with bronchodilator)  Vital Signs:  Patient profile:   60 year old female Height:      65 inches Weight:      256.50 pounds BMI:     42.84 Pulse rate:   68 / minute Pulse rhythm:   regular BP sitting:   138 /  82  (left arm) Cuff size:   large  Vitals Entered By: Danielle Rankin, CMA (September 22, 2010 10:17 AM)  Physical Exam  General:  Well nourished, well developed, in no acute distress HEENT: normal Neck: no JVD Cardiac:  normal S1, S2; irreg irreg; no murmur Lungs:  clear to auscultation bilaterally, no wheezing, rhonchi or rales Abd: soft, nontender, no hepatomegaly Ext: no edema Skin: warm and dry Neuro:  CNs 2-12 intact, no focal abnormalities noted    Impression & Recommendations:  Problem # 1:  CHRONIC DIASTOLIC HEART FAILURE (ICD-428.32)  Improved with better blood pressure  control. Volume appears stable. Last BNP was in the 500s. Check bmet and bnp today.  Orders: TLB-BMP (Basic Metabolic Panel-BMET) (80048-METABOL) TLB-BNP (B-Natriuretic Peptide) (83880-BNPR)  Problem # 2:  ATRIAL FIBRILLATION (ICD-427.31) She is in recurrent AFib today in the office with controlled rate. Discussed with Dr. Graciela Husbands. We discussed how recent evidence indicates ASA may be more dangerous than not taking anything for stroke prevention (when someone is not on coumadin). Therefore, we will not recommend ASA to her. Her rate is controlled.  Her noncompliance with medications makes recommending flecainide again problematic.  Also, she is not a candidate for coumadin with noncompliance. After further review with Dr. Graciela Husbands, I will continue her on diltiazem and increase her dose to 180 mg once daily. Follow up with Dr. Graciela Husbands.  Problem # 3:  ANXIETY STATE, UNSPECIFIED (ICD-300.00) I have asked her to follow up with her PCP.  Problem # 4:  HYPERTENSION (ICD-401.9)  Much better control. Will increase diltiazem to 180 mg once daily.  Orders: TLB-BMP (Basic Metabolic Panel-BMET) (80048-METABOL)  Patient Instructions: 1)  Your physician recommends that you schedule a follow-up appointment in: 6 weeks with Dr. Graciela Husbands 2)  Your physician recommends that you return for lab work ZO:XWRUE: BMET,BNP. 3)  Your physician has recommended you make the following change in your medication: Ditiazem 180 mg. Take one tablet by mouth once a day. Prescriptions: DILTIAZEM HCL ER BEADS 180 MG XR24H-CAP (DILTIAZEM HCL ER BEADS) Take one capsule by mouth daily  #30 x 8   Entered by:   Ollen Gross, RN, BSN   Authorized by:   Tereso Newcomer PA-C   Signed by:   Ollen Gross, RN, BSN on 09/22/2010   Method used:   Electronically to        [facility] [address] (retail)       [phone]       [place], [place]  [address]       Ph: [phone]       Fax: [phone]    RxID:   [id number]  I have personally reviewed the prescriptions today for accuracy.Tereso Newcomer PA-C  September 22, 2010 1:43 PM   Appended Document: f3w per check out on 11/11/lg    Clinical Lists Changes  Observations: Added new observation of EKG INTERP: AFib with HR 86 LVH NSSTTW changes (09/22/2010 13:45)       EKG  Procedure date:  09/22/2010  Findings:      AFib with HR 86 LVH NSSTTW changes

## 2010-11-18 NOTE — Assessment & Plan Note (Signed)
Summary: 2 month f/u bp /tmm   Vital Signs:  Patient profile:   60 year old female Weight:      266 pounds BMI:     44.42 O2 Sat:      96 % on Room air Temp:     98.6 degrees F Pulse rate:   74 / minute Pulse rhythm:   regular Resp:     20 per minute BP sitting:   165 / 89  (right arm)  Vitals Entered By: Chauncy Passy, SMA  O2 Flow:  Room air CC: Pt. is here for a 2 month f/u for BP. Pt. states she is still wheezing constantly. , Hypertension Management Is Patient Diabetic? No Pain Assessment Patient in pain? no       Does patient need assistance? Functional Status Self care Ambulation Normal Comments PF: 290 - 320 - 320   Primary Care Provider:  Tereso Newcomer PA-C  CC:  Pt. is here for a 2 month f/u for BP. Pt. states she is still wheezing constantly.  and Hypertension Management.  History of Present Illness: Here for f/u. Still having DOE and wheezing.  Not using proventil due to palpitations.  NYHA Class 2b.  No orthopnea.  No PND.  No syncope or chest pain. Has a h/o sarcoid years ago.  States she was on prednisone at one time. Recent CT without mention of hilar adenopathy. Not a smoker. Recent PFTs normal. Never got allegra.  She does have a lot of sneezing and sinus drainage.  HTN:  Taking all meds.  Started Lisinopril in Jan.  Not sure if wheezing any worse.  Notes a recent cough. No production.    Anxiety:  Does not want to take zoloft.  Never got xanax.  Rx was faxed to health dept.  AF:  She has occ. palps.  I think she misses doses of flecainide.  She does have a h/o PAC/PVCs.    Hypertension History:      She complains of palpitations and dyspnea with exertion, but denies chest pain, orthopnea, PND, and neurologic problems.        Positive major cardiovascular risk factors include female age 38 years old or older, hyperlipidemia, and hypertension.  Negative major cardiovascular risk factors include non-tobacco-user status.        Positive history  for target organ damage include cardiac end organ damage (either CHF or LVH).     Habits & Providers  Alcohol-Tobacco-Diet     Tobacco Status: never  Current Medications (verified): 1)  Lisinopril 10 Mg Tabs (Lisinopril) .... Take 1 Tablet By Mouth Once A Day 2)  Furosemide 40 Mg Tabs (Furosemide) .... Take One Daily 3)  Simvastatin 20 Mg Tabs (Simvastatin) .... Take One Half Daily 4)  Cardizem Cd 360 Mg Xr24h-Cap (Diltiazem Hcl Coated Beads) .... Take 1 Tablet By Mouth Once A Day 5)  Tambocor 100 Mg Tabs (Flecainide Acetate) .... Take One Every 12 Hours 6)  Miralax  Powd (Polyethylene Glycol 3350) .Marland Kitchen.. 1 Capful Daily 7)  Aspir-Low 81 Mg Tbec (Aspirin) .... Take 1 Tablet By Mouth Once A Day 8)  Pepcid 20 Mg Tabs (Famotidine) .... Take 1 Tablet By Mouth Two Times A Day 9)  Allegra 180 Mg Tabs (Fexofenadine Hcl) .... Take 1 Tablet By Mouth Once A Day For Allergies 10)  Alprazolam 0.25 Mg Tabs (Alprazolam) .... Take 1 Tablet By Mouth Two Times A Day As Needed For Anxiety  Allergies (verified): 1)  ! Darvocet 2)  !  Lipitor  Past History:  Past Medical History: abnormal Myoview scan with anterior ischemia  cardiac catheterization 10/10 no obstructive coronary disease CHF (ICD-428.0)    a.  normal LVF by echo 07/2009 with mild LVH (?diastolic CHF) ATRIAL FIBRILLATION (ICD-427.31)    a.  s/p DCCV 07/2009 DYSLIPIDEMIA (ICD-272.4) CHRONIC VENOUS HYPERTENSION WITHOUT COMPS (ICD-459.30) SARCOIDOSIS (ICD-135)    a.  dx by eye exam in past OBESITY (ICD-278.00) ANEMIA (ICD-285.9) LUNG NODULE (ICD-518.89)    a. 12mm RLL nodule on chest CT 07/2009    b.  needs f/u chest CT January 2011 . . . resolved on f/u CT I B S-DIARRHEAL PREDOMINATE (ICD-564.1) Hx of UNSPECIFIED HEPATITIS (ICD-573.3) DEPRESSIVE DISORDER NOT ELSEWHERE CLASSIFIED (ICD-311) ? Chronic bronchitis.  Colon polyps    a.  had colonoscopy with Dr. Arlyce Dice in 2009 (patient told to have f/u in  2012) Anemia Hypertension Glucose intolerance (hgb A1C 6.1 09/02/2009) PFTs 11/15/2009:  FEV1 95; FEV1/FVC 83; (Normal airflow, no change with bronchodilator)  Physical Exam  General:  alert, well-developed, and well-nourished.   Head:  normocephalic and atraumatic.   Eyes:  pupils equal, pupils round, and pupils reactive to light.   Mouth:  pharynx pink and moist.   Neck:  supple.   Lungs:  normal breath sounds.   Heart:  normal S1 and S2 RRR with premature beats noted no murmur  Neurologic:  alert & oriented X3 and cranial nerves II-XII intact.   Psych:  normally interactive.     Impression & Recommendations:  Problem # 1:  HYPERTENSION (ICD-401.9) uncontrolled ? if ACE causing her to have cough and wheezing will d/c lisinopril and start diovan 160 mg once daily  The following medications were removed from the medication list:    Lisinopril 10 Mg Tabs (Lisinopril) .Marland Kitchen... Take 1 tablet by mouth once a day Her updated medication list for this problem includes:    Furosemide 40 Mg Tabs (Furosemide) .Marland Kitchen... Take one daily    Cardizem Cd 360 Mg Xr24h-cap (Diltiazem hcl coated beads) .Marland Kitchen... Take 1 tablet by mouth once a day    Diovan 160 Mg Tabs (Valsartan) .Marland Kitchen... Take 1 tablet by mouth once a day for blood pressure  Problem # 2:  SHORTNESS OF BREATH (ICD-786.05)  has a lot of dyspnea and wheezing neg heart cath last year normal LVF recent normal PFTs nonsmoker has a h/o sarcoid.  . . will refer to pulmonary (?if she needs to be on prednisone)  Orders: Pulmonary Referral (Pulmonary)  Problem # 3:  SARCOIDOSIS (ICD-135)  CT recently without hilar LAD and recent PFTs ok ? if sarcoid is the reason for her symptoms as above, will send to pulmonary  Orders: Pulmonary Referral (Pulmonary)  Problem # 4:  ATRIAL FIBRILLATION (ICD-427.31) she is somewhat noncompliant with meds advised her to call card if she has palps advised her to take flecainide as prescribed appears to  be in NSR with premature beats by my exam today  Her updated medication list for this problem includes:    Cardizem Cd 360 Mg Xr24h-cap (Diltiazem hcl coated beads) .Marland Kitchen... Take 1 tablet by mouth once a day    Tambocor 100 Mg Tabs (Flecainide acetate) .Marland Kitchen... Take one every 12 hours    Aspir-low 81 Mg Tbec (Aspirin) .Marland Kitchen... Take 1 tablet by mouth once a day  Problem # 5:  CHRONIC DIASTOLIC HEART FAILURE (ICD-428.32) optivolemic do not think vol overload a cause for her symptoms  The following medications were removed from the medication list:  Lisinopril 10 Mg Tabs (Lisinopril) .Marland Kitchen... Take 1 tablet by mouth once a day Her updated medication list for this problem includes:    Furosemide 40 Mg Tabs (Furosemide) .Marland Kitchen... Take one daily    Aspir-low 81 Mg Tbec (Aspirin) .Marland Kitchen... Take 1 tablet by mouth once a day    Diovan 160 Mg Tabs (Valsartan) .Marland Kitchen... Take 1 tablet by mouth once a day for blood pressure  Problem # 6:  PREVENTIVE HEALTH CARE (ICD-V70.0) schedule cpp  Problem # 7:  ANXIETY STATE, UNSPECIFIED (ICD-300.00) refill xanax  Her updated medication list for this problem includes:    Alprazolam 0.25 Mg Tabs (Alprazolam) .Marland Kitchen... Take 1 tablet by mouth two times a day as needed for anxiety  Complete Medication List: 1)  Furosemide 40 Mg Tabs (Furosemide) .... Take one daily 2)  Simvastatin 20 Mg Tabs (Simvastatin) .... Take one half daily 3)  Cardizem Cd 360 Mg Xr24h-cap (Diltiazem hcl coated beads) .... Take 1 tablet by mouth once a day 4)  Tambocor 100 Mg Tabs (Flecainide acetate) .... Take one every 12 hours 5)  Miralax Powd (Polyethylene glycol 3350) .Marland Kitchen.. 1 capful daily 6)  Aspir-low 81 Mg Tbec (Aspirin) .... Take 1 tablet by mouth once a day 7)  Pepcid 20 Mg Tabs (Famotidine) .... Take 1 tablet by mouth two times a day 8)  Allegra 180 Mg Tabs (Fexofenadine hcl) .... Take 1 tablet by mouth once a day for allergies 9)  Alprazolam 0.25 Mg Tabs (Alprazolam) .... Take 1 tablet by mouth two  times a day as needed for anxiety 10)  Diovan 160 Mg Tabs (Valsartan) .... Take 1 tablet by mouth once a day for blood pressure  Hypertension Assessment/Plan:      The patient's hypertensive risk group is category C: Target organ damage and/or diabetes.  Today's blood pressure is 165/89.  Her blood pressure goal is < 140/90.  Patient Instructions: 1)  Td shot today. 2)  Stop Lisinopril. 3)  Start Diovan for blood pressure. 4)  Return to the lab 2 weeks after changing Lisinopril to Diovan for a blood test (CMET) Dx 401.1, 272.4 and BP check. 5)  Please schedule a follow-up appointment in 3 months with Lazlo Tunney for CPP.  Come fasting for labs (nothing to eat or drink after midnight except water). 6)  I will set you up to see a lung doctor.  Someone should call you. 7)    Prescriptions: ALPRAZOLAM 0.25 MG TABS (ALPRAZOLAM) Take 1 tablet by mouth two times a day as needed for anxiety  #30 x 0   Entered and Authorized by:   Tereso Newcomer PA-C   Signed by:   Tereso Newcomer PA-C on 01/20/2010   Method used:   Print then Give to Patient   RxID:   [id number] ALLEGRA 180 MG TABS (FEXOFENADINE HCL) Take 1 tablet by mouth once a day for allergies  #30 x 5   Entered and Authorized by:   Tereso Newcomer PA-C   Signed by:   Tereso Newcomer PA-C on 01/20/2010   Method used:   Print then Give to Patient   RxID:   [id number] DIOVAN 160 MG TABS (VALSARTAN) Take 1 tablet by mouth once a day for blood pressure  #30 x 5   Entered and Authorized by:   Tereso Newcomer PA-C   Signed by:   Tereso Newcomer PA-C on 01/20/2010   Method used:   Print then Give to Patient   RxID:   [id number]

## 2010-11-18 NOTE — Progress Notes (Signed)
Summary: pulmonary referral   Phone Note Outgoing Call   Summary of Call: Stephanie [name], She needs a referral to pulmonary for sarcoidosis. Referral letter in system. Initial call taken by: Tereso Newcomer PA-C,  January 20, 2010 5:30 PM

## 2010-11-18 NOTE — Progress Notes (Signed)
Summary: DIDNT GET ALLEGRA/XANAX OR OTHER  Medications Added LISINOPRIL 10 MG TABS (LISINOPRIL) Take 1 tablet by mouth once a day ALPRAZOLAM 0.25 MG TABS (ALPRAZOLAM) Take 1 tablet by mouth two times a day as needed for anxiety       Phone Note Call from Patient Call back at Home Phone [phone]   Reason for Call: Refill Medication Summary of Call: WEAVER PT. MS Tvedt CALLED AND SAYS THAT SHE NEVER GOT HER XANAX. SHE KNOW ITS NOT RO RAPID HEART BEAT, BUT SHE TAKES IT ALONG WITH HER HEART MEDICINE. SHE SAYS SCOTT IS THE ONE WHO PRESCRIBE IT. SHE SAYS SHE TAKE IT MAINLY AT NIGHT BECAUSE SHE WALKS  A LOT AT NIGHT AND SHES GOING THRU MENA[place]USE AND SHE TAKES IT TO HELP HER CALM DOWN. Initial call taken by: Leodis Rains,  November 29, 2009 4:43 PM  Follow-up for Phone Call        Left message on answering machine for pt to call back.Marland KitchenMarland KitchenMarland KitchenArmenia Shannon  December 02, 2009 12:26 PM   scott spoke with pt.. Armenia Shannon  December 04, 2009 12:56 PM   Additional Follow-up for Phone Call Additional follow up Details #1::        Will refill xanax.  Patient wants sent to health dept. Rx on your desk. Additional Follow-up by: Tereso Newcomer PA-C,  December 04, 2009 4:41 PM    New/Updated Medications: LISINOPRIL 10 MG TABS (LISINOPRIL) Take 1 tablet by mouth once a day ALPRAZOLAM 0.25 MG TABS (ALPRAZOLAM) Take 1 tablet by mouth two times a day as needed for anxiety Prescriptions: ALPRAZOLAM 0.25 MG TABS (ALPRAZOLAM) Take 1 tablet by mouth two times a day as needed for anxiety  #30 x 0   Entered by:   Tereso Newcomer PA-C   Authorized by:   Armenia Shannon   Signed by:   Tereso Newcomer PA-C on 12/04/2009   Method used:   Printed then faxed to ...       [facility] [address] [address] (retail)       [address]       [facility]       [place], [place]  [address]       Ph: [phone]       Fax: [phone]   RxID:   [id number]

## 2010-11-18 NOTE — Miscellaneous (Signed)
   Clinical Lists Changes  Observations: Added new observation of COLONRECACT: Path. benign Patient to have repeat colo in 2012 (12/26/2007 14:01) Added new observation of COLONOSCOPY:  Results: Polyp.  Results: Specimen sent for pathology.    Location:  [facility].   (12/26/2007 14:01)      Colonoscopy  Procedure date:  12/26/2007  Findings:       Results: Polyp.  Results: Specimen sent for pathology.    Location:  [facility].    Comments:      Path. benign Patient to have repeat colo in 2012

## 2010-11-18 NOTE — Letter (Signed)
Summary: *HSN Results Follow up  [facility]  [address] [place], [place] [address]   Phone: [phone]  Fax: [phone]      09/05/2010   Stephanie [name] [address] [place], [place]  [address]   Dear  [name],                            ____S.Drinkard,FNP   ____D. Gore,FNP       ____B. McPherson,MD   ____V. Rankins,MD    ____E. Mulberry,MD    ____N. Daphine Deutscher, FNP  ____D. Reche Dixon, MD    ____K. Philipp Deputy, MD    ____Other     This letter is to inform you that your recent test(s):   _______Pap Smear    _______Lab Test     _______X-ray    _______ is within acceptable limits  _______ requires a medication change  _______ requires a follow-up lab visit  _______ requires a follow-up visit with your Orla Jolliff   Comments:  We have been trying to reach you at [phone].  Please give the office a call.       _________________________________________________________ If you have any questions, please contact our office                     Sincerely,  Armenia Shannon [facility]

## 2010-11-18 NOTE — Progress Notes (Signed)
Summary: Office Visit/DEPRESSION SCREENING  Office Visit/DEPRESSION SCREENING   Imported By: Arta Bruce 11/14/2009 15:07:15  _____________________________________________________________________  External Attachment:    Type:   Image     Comment:   External Document

## 2010-11-18 NOTE — Assessment & Plan Note (Signed)
Summary: f/u on pneumonia///cns   Vital Signs:  Patient profile:   60 year old female Height:      65 inches Weight:      272 pounds BMI:     45.43 Temp:     97.6 degrees F oral Pulse rate:   90 / minute Pulse rhythm:   regular Resp:     18 per minute BP sitting:   143 / 83  (left arm) Cuff size:   large  Vitals Entered By: Armenia Shannon (November 06, 2009 3:29 PM) CC: f/u on pneumonia...  pt says the med is not working.. pt says she thinks it is either asthma or allergies , Hypertension Management Is Patient Diabetic? No Pain Assessment Patient in pain? no       Does patient need assistance? Functional Status Self care Ambulation Normal   CC:  f/u on pneumonia...  pt says the med is not working.. pt says she thinks it is either asthma or allergies  and Hypertension Management.  History of Present Illness: Here for f/u. Had f/u chest CT.  Nodule resolved.  But, had evidence of probable pneumonia in 2 lobes.  Tx her with amox and doxy.  She did not take doxy correctly.  She has only been taking once daily.  Has 2 tabs left.  Denies cough.  No sputum production.  She still notes wheezing and shortness of breath.  Worse when she is outside in the extreme cold or at work.  She works at [employer].  SHe notices what looks like mold on  the ceiling.  Has not worked this week.  She feels better this week.   Still has not started zoloft.  Does want to take.  PHQ9=2 today.  Does not want to see Marchelle Folks.  May have some anxiety issues as well.  Ok for her to take.   Hypertension History:      She complains of peripheral edema, but denies chest pain, dyspnea with exertion, orthopnea, and syncope.        Positive major cardiovascular risk factors include female age 23 years old or older, hyperlipidemia, and hypertension.  Negative major cardiovascular risk factors include non-tobacco-user status.        Positive history for target organ damage include cardiac end organ damage (either CHF or  LVH).     Habits & Providers  Alcohol-Tobacco-Diet     Alcohol drinks/day: 2     Needs 'eye opener' in am: no     Tobacco Status: never     Passive Smoke Exposure: yes  Exercise-Depression-Behavior     Drug Use: marijuanna  Problems Prior to Update: 1)  Reactive Airway Disease  (ICD-493.90) 2)  Pneumonia  (ICD-486) 3)  Gerd  (ICD-530.81) 4)  Glucose Intolerance  (ICD-271.3) 5)  Preventive Health Care  (ICD-V70.0) 6)  Hypertension  (ICD-401.9) 7)  Anxiety State, Unspecified  (ICD-300.00) 8)  Abnormal Cv (STRESS) Test  (ICD-794.39) 9)  Chest Pain-unspecified  (ICD-786.50) 10)  Chronic Diastolic Heart Failure  (ICD-428.32) 11)  Atrial Fibrillation  (ICD-427.31) 12)  Dyslipidemia  (ICD-272.4) 13)  Chronic Venous Hypertension Without Comps  (ICD-459.30) 14)  Sarcoidosis  (ICD-135) 15)  Shortness of Breath  (ICD-786.05) 16)  Obesity  (ICD-278.00) 17)  Anemia  (ICD-285.9) 18)  Lung Nodule  (ICD-518.89) 19)  I B S-diarrheal Predominate  (ICD-564.1) 20)  Hx of Unspecified Hepatitis  (ICD-573.3) 21)  Depressive Disorder Not Elsewhere Classified  (ICD-311)  Current Medications (verified): 1)  Vasotec 20 Mg Tabs (  Enalapril Maleate) .... Take 1 Tablet By Mouth Once A Day 2)  Furosemide 40 Mg Tabs (Furosemide) .... Take One Daily 3)  Simvastatin 20 Mg Tabs (Simvastatin) .... Take One Half Daily 4)  Diltiazem Hcl Er Beads 240 Mg Xr24h-Cap (Diltiazem Hcl Er Beads) .... Take One Daily 5)  Tambocor 100 Mg Tabs (Flecainide Acetate) .... Take One Every 12 Hours 6)  Miralax  Powd (Polyethylene Glycol 3350) .Marland Kitchen.. 1 Capful Daily 7)  Zoloft 50 Mg Tabs (Sertraline Hcl) .... Take 1 Tablet By Mouth Once A Day 8)  Aspir-Low 81 Mg Tbec (Aspirin) .... Take 1 Tablet By Mouth Once A Day 9)  Pepcid 20 Mg Tabs (Famotidine) .... Take 1 Tablet By Mouth Two Times A Day  Allergies (verified): 1)  ! Darvocet 2)  ! Lipitor  Past History:  Past Medical History: Last updated: 10/25/2009  abnormal  Myoview scan with anterior ischemia  cardiac catheterization 10/10 no obstructive coronary disease CHF (ICD-428.0)    a.  normal LVF by echo 07/2009 with mild LVH (?diastolic CHF) ATRIAL FIBRILLATION (ICD-427.31)    a.  s/p DCCV 07/2009 DYSLIPIDEMIA (ICD-272.4) CHRONIC VENOUS HYPERTENSION WITHOUT COMPS (ICD-459.30) SARCOIDOSIS (ICD-135)    a.  dx by eye exam in past OBESITY (ICD-278.00) ANEMIA (ICD-285.9) LUNG NODULE (ICD-518.89)    a. 12mm RLL nodule on chest CT 07/2009    b.  needs f/u chest CT January 2011 . . . resolved on f/u CT I B S-DIARRHEAL PREDOMINATE (ICD-564.1) Hx of UNSPECIFIED HEPATITIS (ICD-573.3) DEPRESSIVE DISORDER NOT ELSEWHERE CLASSIFIED (ICD-311) ? Chronic bronchitis.  Colon polyps    a.  had colonoscopy with Dr. Arlyce Dice in 2009 (patient told to have f/u in 2012) Anemia Hypertension Glucose intolerance (hgb A1C 6.1 09/02/2009)  Social History: Passive Smoke Exposure:  yes Drug Use:  marijuanna  Physical Exam  General:  alert and well-developed.   Head:  normocephalic and atraumatic.   Neck:  supple and no cervical lymphadenopathy.   Lungs:  normal breath sounds, no crackles, and no wheezes.   Heart:  normal rate and regular rhythm.   Extremities:  no edema  Neurologic:  alert & oriented X3 and cranial nerves II-XII intact.   Psych:  normally interactive and good eye contact.     Impression & Recommendations:  Problem # 1:  PNEUMONIA (ICD-486)  send for chest xray  Orders: Diagnostic X-Ray/Fluoroscopy (Diagnostic X-Ray/Flu)  Problem # 2:  REACTIVE AIRWAY DISEASE (ICD-493.90)  having a lot of palp's with using proventil does not like to use ? related to allergens will add H1RA (allegra) schedule PFTs  Orders: PFT Baseline-Pre/Post Bronchodiolator (PFT Baseline-Pre/Pos)  Problem # 3:  DEPRESSIVE DISORDER NOT ELSEWHERE CLASSIFIED (ICD-311) patient not taking zoloft  wants to start taking has not seen LCSW and does not want to  see   Her updated medication list for this problem includes:    Zoloft 50 Mg Tabs (Sertraline hcl) .Marland Kitchen... Take 1 tablet by mouth once a day  Problem # 4:  HYPERTENSION (ICD-401.9)  Her updated medication list for this problem includes:    Vasotec 20 Mg Tabs (Enalapril maleate) .Marland Kitchen... Take 1 tablet by mouth once a day    Furosemide 40 Mg Tabs (Furosemide) .Marland Kitchen... Take one daily    Cardizem Cd 360 Mg Xr24h-cap (Diltiazem hcl coated beads) .Marland Kitchen... Take 1 tablet by mouth once a day  Complete Medication List: 1)  Vasotec 20 Mg Tabs (Enalapril maleate) .... Take 1 tablet by mouth once a day 2)  Furosemide 40  Mg Tabs (Furosemide) .... Take one daily 3)  Simvastatin 20 Mg Tabs (Simvastatin) .... Take one half daily 4)  Cardizem Cd 360 Mg Xr24h-cap (Diltiazem hcl coated beads) .... Take 1 tablet by mouth once a day 5)  Tambocor 100 Mg Tabs (Flecainide acetate) .... Take one every 12 hours 6)  Miralax Powd (Polyethylene glycol 3350) .Marland Kitchen.. 1 capful daily 7)  Zoloft 50 Mg Tabs (Sertraline hcl) .... Take 1 tablet by mouth once a day 8)  Aspir-low 81 Mg Tbec (Aspirin) .... Take 1 tablet by mouth once a day 9)  Pepcid 20 Mg Tabs (Famotidine) .... Take 1 tablet by mouth two times a day 10)  Allegra 180 Mg Tabs (Fexofenadine hcl) .... Take 1 tablet by mouth once a day for allergies  Hypertension Assessment/Plan:      The patient's hypertensive risk group is category C: Target organ damage and/or diabetes.  Today's blood pressure is 143/83.  Her blood pressure goal is < 140/90.  Patient Instructions: 1)  Schedule appointment with Susie Piper for glucose intolerance. 2)  Please schedule a follow-up appointment in 2 months with Kassadee Carawan for blood pressure and wheezing. 3)    Prescriptions: ALLEGRA 180 MG TABS (FEXOFENADINE HCL) Take 1 tablet by mouth once a day for allergies  #30 x 5   Entered and Authorized by:   Tereso Newcomer PA-C   Signed by:   Tereso Newcomer PA-C on 11/06/2009   Method used:   Faxed to ...        [facility] (retail)       [address] [place], [place]  [address]       Ph: [phone]       Fax: [phone]   RxID:   [id number] CARDIZEM CD 360 MG XR24H-CAP (DILTIAZEM HCL COATED BEADS) Take 1 tablet by mouth once a day  #30 x 5   Entered and Authorized by:   Tereso Newcomer PA-C   Signed by:   Tereso Newcomer PA-C on 11/06/2009   Method used:   Faxed to ...       [facility] (retail)       [address] [place], [place]  [address]       Ph: [phone]       Fax: [phone]   RxID:   [id number]

## 2010-11-18 NOTE — Progress Notes (Signed)
   Phone Note Outgoing Call   Summary of Call: spoke to patient regarding her chest CT I called in meds for tx of pneumonia for her she needs to see me in 7-10 days for f/u so I can listen to her and get a f/u cxr Initial call taken by: Brynda Rim,  October 25, 2009 4:59 PM  Follow-up for Phone Call        CALLED [facility] FOR APPOINT W/ AMANDA.SHE HAS SOME CONCERNS //STARTED TAKING THE PILLS SCOTT CALLED IN FRIDAY.SHE STILL HAS SLIGHT FEVER AND CHILLS AND ALSO SWEATING//PLEASE CALL HER BACK [phone] Follow-up by: Arta Bruce,  October 28, 2009 4:11 PM  Additional Follow-up for Phone Call Additional follow up Details #1::        Take antibxs until all gone. Take tylenol as needed for pain or fever. Go to ED if fever of 101 or higher or develops shortness of breath. F/u as noted in previous note or sooner if worse. Additional Follow-up by: Tereso Newcomer PA-C,  October 28, 2009 5:23 PM    Additional Follow-up for Phone Call Additional follow up Details #2::    pt is aware of appt and tx Follow-up by: Armenia Shannon,  October 29, 2009 12:35 PM

## 2010-11-18 NOTE — Letter (Signed)
Summary: TEST ORDER FORM//CT//APPT DATE & TIME  TEST ORDER FORM//CT//APPT DATE & TIME   Imported By: Arta Bruce 01/01/2010 15:24:10  _____________________________________________________________________  External Attachment:    Type:   Image     Comment:   External Document

## 2010-11-18 NOTE — Progress Notes (Signed)
Summary: Xanax refill  Medications Added ALPRAZOLAM 0.25 MG TABS (ALPRAZOLAM) One tablet by mouth daily as needed for nerves       Phone Note Outgoing Call   Summary of Call: Hey Stephanie I saw patient today at [facility]. She wants refills on xanax. I asked her to get from you guys since I am not her PCP anymore. Thanks! Initial call taken by: Brynda Rim,  August 29, 2010 11:33 AM  Follow-up for Phone Call        forward to provider.Marland KitchenMarland KitchenMarland KitchenArmenia [name]  September 01, 2010 1:29 PM [facility] [address]  Additional Follow-up for Phone Call Additional follow up Details #1::        I don't see xanax on her medication list - last 12/10 who has she been it from? Or does she just take sparingly Additional Follow-up by: Lehman Prom FNP,  September 01, 2010 1:46 PM    Additional Follow-up for Phone Call Additional follow up Details #2::    i was looking for it too yesterday and it looks as if someone from [place] took her off of it... but its in her med list at the bottom of the page.Marland KitchenMarland KitchenArmenia [name]  September 02, 2010 11:42 AM   Ok.  will restart.   Rx in basket - shiela to fax to pharmacy notify pt to check at pharmacy by end of the day n.martin,fnp  September 02, 2010  1:03 PM  Left message on answering machine for pt to call back........Marland KitchenArmenia [name]  September 02, 2010 4:43 PM tried calling pt but vm is full..Stephanie [name]  September 04, 2010 11:41 AM   Additional Follow-up for Phone Call Additional follow up Details #3:: Details for Additional Follow-up Action Taken: vm is full.... will mail letter.Marland KitchenMarland KitchenArmenia [name]  September 05, 2010 9:19 AM   New/Updated Medications: ALPRAZOLAM 0.25 MG TABS (ALPRAZOLAM) One tablet by mouth daily as needed for nerves Prescriptions: ALPRAZOLAM 0.25 MG TABS (ALPRAZOLAM) One tablet by mouth daily as needed for nerves  #30 x 0   Entered and Authorized by:   Lehman Prom FNP   Signed by:   Lehman Prom FNP on 09/02/2010   Method used:    Printed then faxed to ...       [facility] [address] (retail)       [phone]       [place], [place]  [address]       Ph: [phone]       Fax: [phone]   RxID:   [id number]

## 2010-11-18 NOTE — Progress Notes (Signed)
Summary: Patient out of Tambocor but restarted.   Phone Note Call from Patient Call back at [facility] Phone [phone]   Summary of Call: Anuj Summons PT. [facility] CALLED AND WANTS TO TALK WITH Hasset Chaviano BEFORE SHE GOES TO HER APPT AT [facility] IN THE MORNING. HER APPT IS AT 11:15. SHE SAYS ALSO SHE DIDN'T GET HER ALLEGRA AND CARDIZEM WHEN SHE WAS HERE LAST WEEK. THE HEALTH DEPT. DIDN'T HAVE THEM. SHE ALSO SAYS THAT SHE HAS BEEN WITHOUT HER PAMBOCOR BECAUSE SHE CAN'T AFFORD IT AT [facility]. IT COST $30 AND SHE WANTS TO KNOW IF WE HAVE IT AT OUT PHARMACY, IF NOT THEN SHE WILL NEED A REFILL ON HER XANAX REFILLED BECAUSE HER HEART IS STARTING TO BEAT RAPIDLY. Initial call taken by: Leodis Rains,  November 14, 2009 3:15PM  Follow-up for Phone Call        spoke with pt and she wants the meds called into [facility] pharmacy..Armenia Shannon  November 15, 2009 9:36 AM   Additional Follow-up for Phone Call Additional follow up Details #1::        Xanax is not for rapid heartbeats.  How long has she been out of Tambocor? If she is having rapid heartbeats, I cannot just put her back on this med. without knowing what her rhythm is.  If she is in AFib and she starts this med, there is the potential she will have a stroke.  This med. is meant to be taken without stopping it.  I would suggest she go to urgent care if she is having rapid heartbeats to determine what rhythm she is in.  If she is in AFib, she will need to see cardiology before she starts that med. again.  I will send her cardizem and allegra to [facility]. Additional Follow-up by: Tereso Newcomer PA-C,  November 18, 2009 11:04 AM    Additional Follow-up for Phone Call Additional follow up Details #2::    Left message on answering machine for pt to return call....Armenia shannon CMA  November 18, 2009 11:19 AM   pt says she was off of the tambocor for three weeks but she has the med now and feels much... pt is aware of medications is at pharmacy.Marland KitchenMarland KitchenArmenia Shannon  November 19, 2009 10:55 AM   Prescriptions: ALLEGRA 180 MG TABS (FEXOFENADINE HCL) Take 1 tablet by mouth once a day for allergies  #30 x 5   Entered and Authorized by:   Tereso Newcomer PA-C   Signed by:   Tereso Newcomer PA-C on 11/18/2009   Method used:   Faxed to ...       [facility] (retail)       [address] [place], [place]  [address]       Ph: [phone]       Fax: [phone]   RxID:   [id number] CARDIZEM CD 360 MG XR24H-CAP (DILTIAZEM HCL COATED BEADS) Take 1 tablet by mouth once a day  #30 x 5   Entered and Authorized by:   Tereso Newcomer PA-C   Signed by:   Tereso Newcomer PA-C on 11/18/2009   Method used:   Faxed to ...       [facility] (retail)       [address] [place], [place]  [address]       Ph: [phone]       Fax: [phone]  RxID:   [id number]

## 2010-11-18 NOTE — Letter (Signed)
Summary: *Referral Letter  [facility]  [address] [place], [place] [address]   Phone: [phone]  Fax: [phone]    01/20/2010  Thank you in advance for agreeing to see my patient:  Stephanie [name] [address] [name], [place]  [address]  Phone: [phone]  Reason for Referral: 60 yo nonsmoker with h/o sarcoidosis diagnosed many years ago.  She states she was on prednisone for a period of time.  She has noted dyspnea and wheezing and cough over the last year or so that seems to wax and wane.  She had a negative hearth cath done in 2010 when she presented with paroxysmal atrial fibrillation.  She has normal LV function.  She is thought to have had a h/o diastolic heart failure.  She had PFTs done recently that were normal.  She had a CT done recently to follow up on a lung nodule.  The nodule has resolved and there was no hilar adenopathy according to  the CT scan report.  With her h/o sarcoidosis and lingering dyspnea and wheezing, I would like her evaluated further.  Procedures Requested:   Current Medical Problems: 1)  REACTIVE AIRWAY DISEASE (ICD-493.90) 2)  PNEUMONIA (ICD-486) 3)  GERD (ICD-530.81) 4)  GLUCOSE INTOLERANCE (ICD-271.3) 5)  PREVENTIVE HEALTH CARE (ICD-V70.0) 6)  HYPERTENSION (ICD-401.9) 7)  ANXIETY STATE, UNSPECIFIED (ICD-300.00) 8)  ABNORMAL CV (STRESS) TEST (ICD-794.39) 9)  CHEST PAIN-UNSPECIFIED (ICD-786.50) 10)  CHRONIC DIASTOLIC HEART FAILURE (ICD-428.32) 11)  ATRIAL FIBRILLATION (ICD-427.31) 12)  DYSLIPIDEMIA (ICD-272.4) 13)  CHRONIC VENOUS HYPERTENSION WITHOUT COMPS (ICD-459.30) 14)  SARCOIDOSIS (ICD-135) 15)  SHORTNESS OF BREATH (ICD-786.05) 16)  OBESITY (ICD-278.00) 17)  ANEMIA (ICD-285.9) 18)  LUNG NODULE (ICD-518.89) 19)  I B S-DIARRHEAL PREDOMINATE (ICD-564.1) 20)  Hx of UNSPECIFIED HEPATITIS (ICD-573.3) 21)  DEPRESSIVE DISORDER NOT ELSEWHERE CLASSIFIED (ICD-311)   Current Medications: 1)  FUROSEMIDE 40 MG TABS  (FUROSEMIDE) take one daily 2)  SIMVASTATIN 20 MG TABS (SIMVASTATIN) take one half daily 3)  CARDIZEM CD 360 MG XR24H-CAP (DILTIAZEM HCL COATED BEADS) Take 1 tablet by mouth once a day 4)  TAMBOCOR 100 MG TABS (FLECAINIDE ACETATE) take one every 12 hours 5)  MIRALAX  POWD (POLYETHYLENE GLYCOL 3350) 1 capful daily 6)  ASPIR-LOW 81 MG TBEC (ASPIRIN) Take 1 tablet by mouth once a day 7)  PEPCID 20 MG TABS (FAMOTIDINE) Take 1 tablet by mouth two times a day 8)  ALLEGRA 180 MG TABS (FEXOFENADINE HCL) Take 1 tablet by mouth once a day for allergies 9)  ALPRAZOLAM 0.25 MG TABS (ALPRAZOLAM) Take 1 tablet by mouth two times a day as needed for anxiety 10)  DIOVAN 160 MG TABS (VALSARTAN) Take 1 tablet by mouth once a day for blood pressure   Past Medical History: 1)  abnormal Myoview scan with anterior ischemia  2)  cardiac catheterization 10/10 no obstructive coronary disease 3)  CHF (ICD-428.0) 4)     a.  normal LVF by echo 07/2009 with mild LVH (?diastolic CHF) 5)  ATRIAL FIBRILLATION (ICD-427.31) 6)     a.  s/p DCCV 07/2009 7)  DYSLIPIDEMIA (ICD-272.4) 8)  CHRONIC VENOUS HYPERTENSION WITHOUT COMPS (ICD-459.30) 9)  SARCOIDOSIS (ICD-135) 10)     a.  dx by eye exam in past 11)  OBESITY (ICD-278.00) 12)  ANEMIA (ICD-285.9) 13)  LUNG NODULE (ICD-518.89) 14)     a. 12mm RLL nodule on chest CT 07/2009 15)     b.  needs f/u chest CT January 2011 . Marland Kitchen Marland Kitchen  resolved on f/u CT 16)  I B S-DIARRHEAL PREDOMINATE (ICD-564.1) 17)  Hx of UNSPECIFIED HEPATITIS (ICD-573.3) 18)  DEPRESSIVE DISORDER NOT ELSEWHERE CLASSIFIED (ICD-311) 19)  ? Chronic bronchitis.  20)  Colon polyps 21)     a.  had colonoscopy with Dr. Arlyce Dice in 2009 (patient told to have f/u in 2012) 22)  Anemia 23)  Hypertension 24)  Glucose intolerance (hgb A1C 6.1 09/02/2009)   Prior History of Blood Transfusions:   Pertinent Labs:    Thank you again for agreeing to see our patient; please contact us if you have any further questions or  need additional information.  Sincerely,  Tereso Newcomer PA-C

## 2010-11-18 NOTE — Progress Notes (Signed)
     The patient was seen in the emergency room at [facility]  July 29, 2010.  I received a phone call from the emergency room physician.  The patient had not taken any of her meds for at least several weeks.  There was atrial fibrillation but the rate was controlled.  There was some volume overload.  I made a plan with the emergency room physician that the patient would be diuresed in emergency room and sent home with her Lasix restarted. she was not to resume Coumadin as she is taking it very unreliably.  Our office will contact her for followup with her cardiologist in our practice.  Appended Document:  Please contact patient for follow up with her doctor.  Appended Document:  appt w/Dr Graciela Husbands 11/8

## 2010-11-18 NOTE — Letter (Signed)
Summary: *HSN Results Follow up  [facility]  [address] [place], [place] [address]   Phone: [phone]  Fax: [phone]      10/25/2009   Stephanie [name] [address] [place], [place]  [address]   Dear  Ms. Stephanie [name],                            ____S.Drinkard,FNP   ____D. Gore,FNP       ____B. McPherson,MD   ____V. Rankins,MD    ____E. Mulberry,MD    ____N. Daphine Deutscher, FNP  ____D. Reche Dixon, MD    ____K. Philipp Deputy, MD    __x__S. Alben Spittle, PA-C     This letter is to inform you that your recent test(s):  _______Pap Smear    ___x____Lab Test     _______X-ray    ___x____ is within acceptable limits  _______ requires a medication change  _______ requires a follow-up lab visit  _______ requires a follow-up visit with your provider   Comments: Blood counts are now all normal.  All other labs ok, as well.       _________________________________________________________ If you have any questions, please contact our office                     Sincerely,  Stephanie Newcomer PA-C [facility]

## 2010-11-18 NOTE — Letter (Signed)
Summary: REQUESTING RECORDS FROM DR.ALVIN,BLOUNT  REQUESTING RECORDS FROM DR.ALVIN,BLOUNT   Imported By: Arta Bruce 10/31/2009 15:57:46  _____________________________________________________________________  External Attachment:    Type:   Image     Comment:   External Document

## 2010-11-18 NOTE — Letter (Signed)
Summary: PFT'S  PFT'S   Imported By: Arta Bruce 01/21/2010 11:55:04  _____________________________________________________________________  External Attachment:    Type:   Image     Comment:   External Document

## 2010-11-18 NOTE — Assessment & Plan Note (Signed)
Summary: eph/ per dr. Myrtis Ser appt is 12:15 / gd  Medications Added DILT-CD 120 MG XR24H-CAP (DILTIAZEM HCL COATED BEADS) 1 cap once daily      Allergies Added:   Referring Provider:  pcp Primary Provider:  Tereso Newcomer PA-C  CC:  EPH.  Pt scheduled per Dr. Myrtis Ser.  Pt states she is SOB and feels pressure on her chest and feels like she is very sweaty.  "I think I have pneumonia again"  Pt not taking the majority of her medications due to expense and she has run out of them.  .  History of Present Illness: Primary Electrophysiologist:  Dr. Sherryl Manges  Stephanie [name] is a 60 year old female who is a prior patient of mine from [facility] with a history of atrial fibrillation, hypertension, diastolic heart failure, normal coronary arteries by cardiac catheterization in November 2010 who was recently seen in the emergency room.  She was in atrial fibrillation with rapid ventricular rate.  She was also noted to be in mild diastolic congestive heart failure.  She was treated with p.o. diuretics.  She was previously placed on flecainide, but has not been taking any of her medications due to cost.    As noted, she has been out of her medications.  She went to the emergency room with acute shortness of breath.  She was diuresed and discharged.  Her hemoglobin was stable.  Her creatinine was normal.  Her cardiac markers were negative.  Her chest x-ray demonstrated mild congestive heart failure.  Her BNP was 383.  She states that she felt well while she was taking the diltiazem.  However, she is now out of that medication.  Her blood pressure today is uncontrolled.  She continues to note chest pressure.  She also notes shortness of breath with exertion.  She sleeps on 4 pillows.  She has occasional PND.  She denies significant pedal edema.  She does note a cough.  This is nonproductive.  She denies any fevers or chills.  Current Medications (verified): 1)  Furosemide 40 Mg Tabs (Furosemide) .... Take One  Daily 2)  Pepcid 20 Mg Tabs (Famotidine) .... Take 1 Tablet By Mouth Once A Day  Allergies (verified): 1)  ! Darvocet 2)  ! Lipitor  Past History:  Past Medical History: Last updated: 01/20/2010 abnormal Myoview scan with anterior ischemia  cardiac catheterization 10/10 no obstructive coronary disease CHF (ICD-428.0)    a.  normal LVF by echo 07/2009 with mild LVH (?diastolic CHF) ATRIAL FIBRILLATION (ICD-427.31)    a.  s/p DCCV 07/2009 DYSLIPIDEMIA (ICD-272.4) CHRONIC VENOUS HYPERTENSION WITHOUT COMPS (ICD-459.30) SARCOIDOSIS (ICD-135)    a.  dx by eye exam in past OBESITY (ICD-278.00) ANEMIA (ICD-285.9) LUNG NODULE (ICD-518.89)    a. 12mm RLL nodule on chest CT 07/2009    b.  needs f/u chest CT January 2011 . . . resolved on f/u CT I B S-DIARRHEAL PREDOMINATE (ICD-564.1) Hx of UNSPECIFIED HEPATITIS (ICD-573.3) DEPRESSIVE DISORDER NOT ELSEWHERE CLASSIFIED (ICD-311) ? Chronic bronchitis.  Colon polyps    a.  had colonoscopy with Dr. Arlyce Dice in 2009 (patient told to have f/u in 2012) Anemia Hypertension Glucose intolerance (hgb A1C 6.1 09/02/2009) PFTs 11/15/2009:  FEV1 95; FEV1/FVC 83; (Normal airflow, no change with bronchodilator)  Past Surgical History: Cholecystectomy DC cardioversion x2.  Hernia repair 2009.  Hysterectomy 2002,  History right ankle fractures x2.  Family History: Reviewed history from 03/03/2010 and no changes required.  Father died of pneumonia.  Mother died with cancer and  had a history of multiple sclerosis.  She states that emphysema runs in  her family.     Family History Hypertension (father)  Social History: Reviewed history from 03/03/2010 and no changes required. The patient is divorced.  She works as a Proofreader.  No tobacco. Substantiall alcohol intake, sometimes 1 bottle wine/day.  No recreational drugs.   Today (09/02/2009), patient notes 2-3 drinks 1-2 x per week. Patient never smoked.   Review of Systems        As per  the HPI.  All other systems reviewed and negative.   Vital Signs:  Patient profile:   60 year old female Height:      65 inches Weight:      260 pounds BMI:     43.42 Temp:     97.8 degrees F oral Pulse rate:   66 / minute Pulse rhythm:   regular BP sitting:   175 / 93  (left arm) Cuff size:   large  Vitals Entered By: Judithe Modest CMA (August 29, 2010 10:40 AM)  Physical Exam  General:  Well nourished, well developed, in no acute distress HEENT: normal Neck: no JVD Cardiac:  normal S1, S2; RRR; no murmur Lungs:  clear to auscultation bilaterally, no wheezing, rhonchi or rales Abd: soft, nontender, no hepatomegaly Ext: no clubbing, cyanosis or edema Vascular: no carotid  bruits Skin: warm and dry Neuro:  CNs 2-12 intact, no focal abnormalities noted    EKG  Procedure date:  08/29/2010  Findings:      Normal sinus rhythm Left axis deviation LDH Possible junctional escape beat Nonspecific ST-T wave changes  Impression & Recommendations:  Problem # 1:  CHRONIC DIASTOLIC HEART FAILURE (ICD-428.32)  Lung exam good today.  No swelling noted. Symptoms are suggestive of NYHA Class 3 symptoms. This may all be related to uncontrolled BP. She is not taking anything except furosemide. Continue current dose. Check BMET, BNP.  Orders: TLB-BNP (B-Natriuretic Peptide) (83880-BNPR) TLB-BMP (Basic Metabolic Panel-BMET) (80048-METABOL) EKG w/ Interpretation (93000)  Problem # 2:  ATRIAL FIBRILLATION (ICD-427.31)  Maintaining NSR today. Restart Diltiazem. Her CHADS2 = 1 Regardless, she has noncompliance issues and would not be an ideal coumadin candidate. She has been counseled on taking ASA. At this point, I would not put her back on flecainide.  Problem # 3:  DYSLIPIDEMIA (ICD-272.4)  Not currently taking meds. F/u with PCP at [facility].  Problem # 4:  HYPERTENSION (ICD-401.9)  Restart diltiazem. She will likely need another agent to control her  BP.  Problem # 5:  CHEST PAIN-UNSPECIFIED (ICD-786.50)  She had a normal cath in 2010. This is likely related to untreated BP and chronic diastolic CHF.  Problem # 6:  ANXIETY STATE, UNSPECIFIED (ICD-300.00) She is requesting refills on xanax. I asked her to request from her new PCP at [facility]  Patient Instructions: 1)  Your physician recommends that you schedule a follow-up appointment in: 3 WEEKS 09/22/10 WITH SCOTT WEAVER, PA THE SAME DAY THAT DR. KLEIN IS IN THE OFFICE. 2)  Your physician recommends that you return for lab work in: BMET AND BNP TODAY. 3)  Your physician has recommended you make the following change in your medication:  DILIAZEM CD 120MG  1 TABLET EVERY DAY, FUROSEMIDE 40MG  Q TABLET EVERY DAY, ALSO TAKE ASPRIN 325 MG 1 TABLET EVERY DAY, IF THIS IS NOT TOLERATED YOU CAN TRY TAKING AN 81MG  ASPRIN. Prescriptions: DILT-CD 120 MG XR24H-CAP (DILTIAZEM HCL COATED BEADS) 1 cap once daily  #  30 x 11   Entered by:   Danielle Rankin, CMA   Authorized by:   Tereso Newcomer PA-C   Signed by:   Danielle Rankin, CMA on 08/29/2010   Method used:   Print then Give to Patient   RxID:   [id number] FUROSEMIDE 40 MG TABS (FUROSEMIDE) take one daily  #30 x 11   Entered by:   Danielle Rankin, CMA   Authorized by:   Tereso Newcomer PA-C   Signed by:   Danielle Rankin, CMA on 08/29/2010   Method used:   Print then Give to Patient   RxID:   [id number] FUROSEMIDE 40 MG TABS (FUROSEMIDE) take one daily  #30 x 11   Entered by:   Danielle Rankin, CMA   Authorized by:   Talitha Givens, MD, [facility]   Signed by:   Danielle Rankin, CMA on 08/29/2010   Method used:   Print then Give to Patient   RxID:   [id number] DILT-CD 120 MG XR24H-CAP (DILTIAZEM HCL COATED BEADS) 1 cap once daily  #30 x 11   Entered by:   Danielle Rankin, CMA   Authorized by:   Tereso Newcomer PA-C   Signed by:   Danielle Rankin, CMA on 08/29/2010   Method used:   Print then Give to Patient   RxID:   [id number]

## 2010-11-18 NOTE — Assessment & Plan Note (Signed)
Summary: f/u///dcr   Vital Signs:  Patient profile:   60 year old female Height:      65 inches Weight:      268 pounds BMI:     44.76 Temp:     97.7 degrees F oral Pulse rate:   76 / minute Pulse rhythm:   regular Resp:     18 per minute BP sitting:   166 / 90  (left arm) Cuff size:   large  Vitals Entered By: [place] Shannon (October 23, 2009 11:58 AM) CC: f/u...., Hypertension Management Is Patient Diabetic? No Pain Assessment Patient in pain? no       Does patient need assistance? Functional Status Self care Ambulation Normal   CC:  f/u.... and Hypertension Management.  History of Present Illness: Here for f/u.  AFib:  Now back on flecainide.  No more palpitations.    Lung nodule:  Needs f/u CT scan.  WIll schedule.    Wheezing:  Has noticed wheezing for about a year.  She denies a h/o smoking.  She does note some chest tightness with activity.  Wheezing better now.  She does note some DOE but just NYHA 2.  No cough.  No hemoptysis.  She does belch some.  ? water brash symptos.  ?dysphagia. No orthopnea.  No PND.  No significant change in pedal edema.  Depression:  Not taking zoloft.  Took once and did not like the way she felt.  Still feels depressed.  Does have little interest or pleasure in doing things.  No suicidal ideations.  Has not seen LCSW.  Glucose intolerance:  Never saw dietician.  Will make sure she gets appt.    Hypertension History:      no meds this am.        Positive major cardiovascular risk factors include female age 39 years old or older, hyperlipidemia, and hypertension.  Negative major cardiovascular risk factors include non-tobacco-user status.        Positive history for target organ damage include cardiac end organ damage (either CHF or LVH).     Problems Prior to Update: 1)  Gerd  (ICD-530.81) 2)  Glucose Intolerance  (ICD-271.3) 3)  Preventive Health Care  (ICD-V70.0) 4)  Hypertension  (ICD-401.9) 5)  Anxiety State, Unspecified   (ICD-300.00) 6)  Abnormal Cv (STRESS) Test  (ICD-794.39) 7)  Chest Pain-unspecified  (ICD-786.50) 8)  Chronic Diastolic Heart Failure  (ICD-428.32) 9)  Atrial Fibrillation  (ICD-427.31) 10)  Dyslipidemia  (ICD-272.4) 11)  Chronic Venous Hypertension Without Comps  (ICD-459.30) 12)  Sarcoidosis  (ICD-135) 13)  Shortness of Breath  (ICD-786.05) 14)  Obesity  (ICD-278.00) 15)  Anemia  (ICD-285.9) 16)  Lung Nodule  (ICD-518.89) 17)  I B S-diarrheal Predominate  (ICD-564.1) 18)  Hx of Unspecified Hepatitis  (ICD-573.3) 19)  Depressive Disorder Not Elsewhere Classified  (ICD-311)  Current Medications (verified): 1)  Vasotec 10 Mg Tabs (Enalapril Maleate) .... Take One Daily 2)  Furosemide 40 Mg Tabs (Furosemide) .... Take One Daily 3)  Simvastatin 20 Mg Tabs (Simvastatin) .... Take One Half Daily 4)  Diltiazem Hcl Er Beads 240 Mg Xr24h-Cap (Diltiazem Hcl Er Beads) .... Take One Daily 5)  Tambocor 100 Mg Tabs (Flecainide Acetate) .... Take One Every 12 Hours 6)  Miralax  Powd (Polyethylene Glycol 3350) .Marland Kitchen.. 1 Capful Daily 7)  Zoloft 50 Mg Tabs (Sertraline Hcl) .... Take 1 Tablet By Mouth Once A Day 8)  Aspir-Low 81 Mg Tbec (Aspirin) .... Take 1 Tablet  By Mouth Once A Day  Allergies (verified): 1)  ! Darvocet 2)  ! Lipitor  Past History:  Past Medical History: Last updated: 09/10/2009  abnormal Myoview scan with anterior ischemia  cardiac catheterization 10/10 no obstructive coronary disease CHF (ICD-428.0)    a.  normal LVF by echo 07/2009 with mild LVH (?diastolic CHF) ATRIAL FIBRILLATION (ICD-427.31)    a.  s/p DCCV 07/2009 DYSLIPIDEMIA (ICD-272.4) CHRONIC VENOUS HYPERTENSION WITHOUT COMPS (ICD-459.30) SARCOIDOSIS (ICD-135)    a.  dx by eye exam in past OBESITY (ICD-278.00) ANEMIA (ICD-285.9) LUNG NODULE (ICD-518.89)    a. 12mm RLL nodule on chest CT 07/2009    b.  needs f/u chest CT January 2011 I B S-DIARRHEAL PREDOMINATE (ICD-564.1) Hx of UNSPECIFIED HEPATITIS  (ICD-573.3) DEPRESSIVE DISORDER NOT ELSEWHERE CLASSIFIED (ICD-311) ? Chronic bronchitis.  Colon polyps    a.  had colonoscopy with Dr. Arlyce Dice in 2009 (patient told to have f/u in 2012) Anemia Hypertension Glucose intolerance (hgb A1C 6.1 09/02/2009)  Physical Exam  General:  alert, well-developed, and well-nourished.   Head:  normocephalic and atraumatic.   Neck:  no jvd supple Lungs:  CTA bilat no wheezing  no rales  Heart:  normal rate and regular rhythm.   Abdomen:  soft, non-tender, and no hepatomegaly.   Extremities:  no edema Neurologic:  alert & oriented X3 and cranial nerves II-XII intact.   Psych:  normally interactive.     Impression & Recommendations:  Problem # 1:  LUNG NODULE (ICD-518.89)  due for follow up scan . . .will order  Orders: CT without Contrast (CT w/o contrast)  Problem # 2:  GERD (ICD-530.81)  ? if this is cause of wheezing (upper airway noise) CT pending as noted above will put on H2RA to see if this helps  AT F/U:  if wheezing continues, consider further w/u (? UGI series; ? PFTs)  Her updated medication list for this problem includes:    Pepcid 20 Mg Tabs (Famotidine) .Marland Kitchen... Take 1 tablet by mouth two times a day  Problem # 3:  GLUCOSE INTOLERANCE (ICD-271.3) refer to Susie Piper for diet counseling  Problem # 4:  ANEMIA (ICD-285.9) still needs B12 and folate and hgb electrophoresis done  Problem # 5:  PREVENTIVE HEALTH CARE (ICD-V70.0) pneumovax today will eventually need to set up for CPP  Problem # 6:  HYPERTENSION (ICD-401.9) no meds today increase enalapril to 20 once daily   Her updated medication list for this problem includes:    Vasotec 20 Mg Tabs (Enalapril maleate) .Marland Kitchen... Take 1 tablet by mouth once a day    Furosemide 40 Mg Tabs (Furosemide) .Marland Kitchen... Take one daily    Diltiazem Hcl Er Beads 240 Mg Xr24h-cap (Diltiazem hcl er beads) .Marland Kitchen... Take one daily  Problem # 7:  CHRONIC DIASTOLIC HEART FAILURE  (ICD-428.32) optivolemic  Her updated medication list for this problem includes:    Vasotec 20 Mg Tabs (Enalapril maleate) .Marland Kitchen... Take 1 tablet by mouth once a day    Furosemide 40 Mg Tabs (Furosemide) .Marland Kitchen... Take one daily    Aspir-low 81 Mg Tbec (Aspirin) .Marland Kitchen... Take 1 tablet by mouth once a day  Problem # 8:  ATRIAL FIBRILLATION (ICD-427.31) maintaining NSR on flecainide f/u with card. as indicated  Her updated medication list for this problem includes:    Diltiazem Hcl Er Beads 240 Mg Xr24h-cap (Diltiazem hcl er beads) .Marland Kitchen... Take one daily    Tambocor 100 Mg Tabs (Flecainide acetate) .Marland Kitchen... Take one every 12 hours  Aspir-low 81 Mg Tbec (Aspirin) .Marland Kitchen... Take 1 tablet by mouth once a day  Complete Medication List: 1)  Vasotec 20 Mg Tabs (Enalapril maleate) .... Take 1 tablet by mouth once a day 2)  Furosemide 40 Mg Tabs (Furosemide) .... Take one daily 3)  Simvastatin 20 Mg Tabs (Simvastatin) .... Take one half daily 4)  Diltiazem Hcl Er Beads 240 Mg Xr24h-cap (Diltiazem hcl er beads) .... Take one daily 5)  Tambocor 100 Mg Tabs (Flecainide acetate) .... Take one every 12 hours 6)  Miralax Powd (Polyethylene glycol 3350) .Marland Kitchen.. 1 capful daily 7)  Zoloft 50 Mg Tabs (Sertraline hcl) .... Take 1 tablet by mouth once a day 8)  Aspir-low 81 Mg Tbec (Aspirin) .... Take 1 tablet by mouth once a day 9)  Pepcid 20 Mg Tabs (Famotidine) .... Take 1 tablet by mouth two times a day  Hypertension Assessment/Plan:      The patient's hypertensive risk group is category C: Target organ damage and/or diabetes.  Today's blood pressure is 166/90.  Her blood pressure goal is < 140/90.  Patient Instructions: 1)  Schedule appointment with Marchelle Folks. 2)  Pneumovax today. 3)  Please schedule a follow-up appointment in 1 month with Ford Peddie for high blood pressure.  Prescriptions: VASOTEC 20 MG TABS (ENALAPRIL MALEATE) Take 1 tablet by mouth once a day  #30 x 5   Entered and Authorized by:   Tereso Newcomer PA-C    Signed by:   Tereso Newcomer PA-C on 10/23/2009   Method used:   Print then Give to Patient   RxID:   [id number] PEPCID 20 MG TABS (FAMOTIDINE) Take 1 tablet by mouth two times a day  #60 x 3   Entered and Authorized by:   Tereso Newcomer PA-C   Signed by:   Tereso Newcomer PA-C on 10/23/2009   Method used:   Print then Give to Patient   RxID:   [id number]   Appended Document: f/u///dcr   Pneumovax Vaccine    Vaccine Type: Pneumovax    Site: left deltoid    Mfr: Merck    Dose: 0.5 ml    Route: IM    Given by: Geanie Cooley     Exp. Date: 11/15/2010    Lot #: [id number]    VIS given: 05/16/96 version given October 25, 2009.

## 2010-11-18 NOTE — Miscellaneous (Signed)
  Clinical Lists Changes  Observations: Added new observation of CXR RESULTS:  Findings: Trachea is midline.  Heart is mildly enlarged, stable.   There is mild interstitial prominence and indistinctness, with a   basilar predominance.  Trace pleural fluid bilaterally.    IMPRESSION:   Mild congestive heart failure.    Read By:  Reyes Ivan.,  M.D. (07/29/2010 10:50) Added new observation of CXR RESULTS:  Findings: Trachea is midline.  Cardiac silhouette is prominent.   Lungs are clear.  No pleural fluid.  No pneumothorax.    IMPRESSION:   No acute findings.    Read By:  Reyes Ivan.,  M.D. (07/09/2010 10:51)      CXR  Procedure date:  07/29/2010  Findings:       Findings: Trachea is midline.  Heart is mildly enlarged, stable.   There is mild interstitial prominence and indistinctness, with a   basilar predominance.  Trace pleural fluid bilaterally.    IMPRESSION:   Mild congestive heart failure.    Read By:  Reyes Ivan.,  M.D.  CXR  Procedure date:  07/09/2010  Findings:       Findings: Trachea is midline.  Cardiac silhouette is prominent.   Lungs are clear.  No pleural fluid.  No pneumothorax.    IMPRESSION:   No acute findings.    Read By:  Reyes Ivan.,  M.D.

## 2010-12-04 ENCOUNTER — Ambulatory Visit: Payer: Self-pay | Admitting: Internal Medicine

## 2010-12-16 ENCOUNTER — Encounter: Payer: Self-pay | Admitting: Internal Medicine

## 2010-12-16 ENCOUNTER — Ambulatory Visit (INDEPENDENT_AMBULATORY_CARE_PROVIDER_SITE_OTHER): Payer: Self-pay | Admitting: Internal Medicine

## 2010-12-16 DIAGNOSIS — G4719 Other hypersomnia: Secondary | ICD-10-CM

## 2010-12-16 DIAGNOSIS — I4891 Unspecified atrial fibrillation: Secondary | ICD-10-CM

## 2010-12-16 DIAGNOSIS — R5383 Other fatigue: Secondary | ICD-10-CM | POA: Insufficient documentation

## 2010-12-16 DIAGNOSIS — R5381 Other malaise: Secondary | ICD-10-CM | POA: Insufficient documentation

## 2010-12-25 ENCOUNTER — Telehealth: Payer: Self-pay | Admitting: Internal Medicine

## 2010-12-25 NOTE — Assessment & Plan Note (Signed)
Summary: [id number]  Medications Added DILTIAZEM HCL ER BEADS 180 MG XR24H-CAP (DILTIAZEM HCL ER BEADS) Take one capsule by mouth daily-pt picked up 120 mg and has been taking 2 capsules since FEB 18 FISH OIL 1000 MG CAPS (OMEGA-3 FATTY ACIDS) once daily B COMPLEX  TABS (B COMPLEX VITAMINS) take one tablet once daily ATENOLOL 50 MG TABS (ATENOLOL) AS DIRECTED INDERAL LA 60 MG XR24H-CAP (PROPRANOLOL HCL) AS DIRECTED CORGARD 20 MG TABS (NADOLOL) AS DIRECTED      Allergies Added:   Referring Provider:  pcp Primary Provider:  Tereso Newcomer PA-C  CC:  rov.  Pt states she is feeling sluggish and tired often.  She is also SOB while walking up the steps.  She also says that there was a miscommunication on medicine when she saw Tereso Newcomer.  Pt states that she could not get diltiazem 180 refilled so she was taking diltiazem 120 mg 2 capsules daily.  Marland Kitchen  History of Present Illness: Stephanie [name] is seen in followup for atrial fibrillation-paroxysmal dyspnea hypertension and  normal coronary arteries by cardiac catheterization in November 2010.  she had problems with diastolic heart  She was previously placed on flecainide, but has not been taking any of her medications due to cost. recently she has been treated with combinations of diltiazem and furosemide  when she was seen in early November she was in sinus rhythm. Followup visit in December demonstrated atrial fibrillation.  She underwent Myoview scan in November 2010 which was abnormal with evidence of apical ischemia. She underwent subsequent catheterization which demonstrated no obstructive coronary disease. Echo cardiogram around that same time demonstrated mild left ventricular hypertrophy with wall thicknesses of 11-13 mm  Currently her complaints are of fatigue and exercise intolerance  She also has significant daytime fatigue and sleep disordered breathing     Current Medications (verified): 1)  Furosemide 40 Mg Tabs (Furosemide) ....  Take One Daily 2)  Pepcid 20 Mg Tabs (Famotidine) .... Take 1 Tablet By Mouth Once A Day As Needed 3)  Diltiazem Hcl Er Beads 180 Mg Xr24h-Cap (Diltiazem Hcl Er Beads) .... Take One Capsule By Mouth Daily-Pt Picked Up 120 Mg and Has Been Taking 2 Capsules Since Feb 18 4)  Alprazolam 0.25 Mg Tabs (Alprazolam) .... One Tablet By Mouth Nightly As Needed For Nerves 5)  Fish Oil 1000 Mg Caps (Omega-3 Fatty Acids) .... Once Daily 6)  B Complex  Tabs (B Complex Vitamins) .... Take One Tablet Once Daily  Allergies (verified): 1)  ! Darvocet 2)  ! Lipitor  Past History:  Past Medical History: Last updated: 01/20/2010 abnormal Myoview scan with anterior ischemia  cardiac catheterization 10/10 no obstructive coronary disease CHF (ICD-428.0)    a.  normal LVF by echo 07/2009 with mild LVH (?diastolic CHF) ATRIAL FIBRILLATION (ICD-427.31)    a.  s/p DCCV 07/2009 DYSLIPIDEMIA (ICD-272.4) CHRONIC VENOUS HYPERTENSION WITHOUT COMPS (ICD-459.30) SARCOIDOSIS (ICD-135)    a.  dx by eye exam in past OBESITY (ICD-278.00) ANEMIA (ICD-285.9) LUNG NODULE (ICD-518.89)    a. 12mm RLL nodule on chest CT 07/2009    b.  needs f/u chest CT January 2011 . . . resolved on f/u CT I B S-DIARRHEAL PREDOMINATE (ICD-564.1) Hx of UNSPECIFIED HEPATITIS (ICD-573.3) DEPRESSIVE DISORDER NOT ELSEWHERE CLASSIFIED (ICD-311) ? Chronic bronchitis.  Colon polyps    a.  had colonoscopy with Dr. Arlyce Dice in 2009 (patient told to have f/u in 2012) Anemia Hypertension Glucose intolerance (hgb A1C 6.1 09/02/2009) PFTs 11/15/2009:  FEV1 95; FEV1/FVC  83; (Normal airflow, no change with bronchodilator)  Past Surgical History: Last updated: 08/29/2010 Cholecystectomy DC cardioversion x2.  Hernia repair 2009.  Hysterectomy 2002,  History right ankle fractures x2.  Family History: Last updated: 03-21-10  Father died of pneumonia.  Mother died with cancer and   had a history of multiple sclerosis.  She states that emphysema  runs in  her family.     Family History Hypertension (father)  Social History: Last updated: 03/21/10 The patient is divorced.  She works as a Proofreader.  No tobacco. Substantiall alcohol intake, sometimes 1 bottle wine/day.  No recreational drugs.   Today (09/02/2009), patient notes 2-3 drinks 1-2 x per week. Patient never smoked.   Vital Signs:  Patient profile:   60 year old female Height:      65 inches Weight:      259 pounds BMI:     43.26 Pulse rate:   100 / minute Pulse rhythm:   irregular BP sitting:   144 / 82  (left arm) Cuff size:   large  Vitals Entered By: Judithe Modest CMA (December 16, 2010 3:54 PM)  Physical Exam  General:  The patient was alert and oriented in no acute distress.obees HEENT Normal.  Neck veins were flat, carotids were brisk.  Lungs were clear.  Heart sounds were regular without murmurs or gallops.  Abdomen was soft with active bowel sounds. There is no clubbing cyanosis or edema. Skin Warm and dry    Impression & Recommendations:  Problem # 1:  ATRIAL FIBRILLATION (ICD-427.31) She has atrial fibrillation. Her heart rate control is modestly rapid. This may contribute to her dyspnea on exertion. We will continue on her current dose of diltiazem 240 daily. We'll begin her on Pradaxa. I have reviewed with her benefits and risks of bleeding. We'll have her come back and see Tereso Newcomer in 3 weeks. At that time if she has remained out of rhythm we'll anticipate cardioversion. We will plan to initiate antiarrhythmic drug therapy at that time. She'll also need an echo today she comes back to see Scott to assess for left ventricular hypertrophy. The following medications were removed from the medication list:    Atenolol 50 Mg Tabs (Atenolol) .Marland Kitchen... As directed    Inderal La 60 Mg Xr24h-cap (Propranolol hcl) .Marland Kitchen... As directed    Corgard 20 Mg Tabs (Nadolol) .Marland Kitchen... As directed  Orders: EKG w/ Interpretation (93000)  The following  medications were removed from the medication list:    Atenolol 50 Mg Tabs (Atenolol) .Marland Kitchen... As directed    Inderal La 60 Mg Xr24h-cap (Propranolol hcl) .Marland Kitchen... As directed    Corgard 20 Mg Tabs (Nadolol) .Marland Kitchen... As directed  Problem # 2:  HYPERSOMNIA, TRANSIENT (ICD-307.43) she has significant hypersomnia. I would've her that she has sleep apnea. Clearly this would be helpful in identifying and treating it relates to a atrial fibrillation as well hypertension and will arrange a sleep study  Problem # 3:  HYPERTENSION (ICD-401.9)  stable  The following medications were removed from the medication list:    Atenolol 50 Mg Tabs (Atenolol) .Marland Kitchen... As directed    Inderal La 60 Mg Xr24h-cap (Propranolol hcl) .Marland Kitchen... As directed    Corgard 20 Mg Tabs (Nadolol) .Marland Kitchen... As directed Her updated medication list for this problem includes:    Furosemide 40 Mg Tabs (Furosemide) .Marland Kitchen... Take one daily    Diltiazem Hcl Er Beads 180 Mg Xr24h-cap (Diltiazem hcl er beads) .Marland Kitchen... Take one capsule  by mouth daily-pt picked up 120 mg and has been taking 2 capsules since feb 18  Other Orders: Misc. Referral (Misc. Ref)  Patient Instructions: 1)  Your physician recommends that you schedule a follow-up appointment in: 3 WEEKS WITH SCOTT WEAVER PAC  POSSIBLE CARDIOVERSION 2)  Your physician recommends that you return for lab work in: 10 DAYS  CBC  V58.69 3)  Your physician has recommended you make the following change in your medication: START PRADAXA 150 MG 1 two times a day  4)  Your physician has recommended that you have a sleep study.  This test records several body functions during sleep, including:  brain activity, eye movement, oxygen and carbon dioxide blood levels, heart rate and rhythm, breathing rate and rhythm, the flow of air through your mouth and nose, snoring, body muscle movements, and chest and belly movement. Prescriptions: INDERAL LA 60 MG XR24H-CAP (PROPRANOLOL HCL) AS DIRECTED  #14 x 0   Entered by:    Scherrie Bateman, LPN   Authorized by:   Nathen May, MD, [facility]   Signed by:   Scherrie Bateman, LPN on 27/25/3664   Method used:   Print then Give to Patient   RxID:   [id number] ATENOLOL 50 MG TABS (ATENOLOL) AS DIRECTED  #14 x 0   Entered by:   Scherrie Bateman, LPN   Authorized by:   Nathen May, MD, [facility]   Signed by:   Scherrie Bateman, LPN on 43/32/9518   Method used:   Print then Give to Patient   RxID:   [id number] CORGARD 20 MG TABS (NADOLOL) AS DIRECTED  #14 x 0   Entered by:   Scherrie Bateman, LPN   Authorized by:   Nathen May, MD, [facility]   Signed by:   Scherrie Bateman, LPN on 23/55/7322   Method used:   Print then Give to Patient   RxID:   [id number] INDERAL LA 60 MG XR24H-CAP (PROPRANOLOL HCL) AS DIRECTED  #14 x 0   Entered by:   Scherrie Bateman, LPN   Authorized by:   Nathen May, MD, [facility]   Signed by:   Scherrie Bateman, LPN on 51/76/1607   Method used:   Electronically to        [facility] [address] (retail)       [phone]       [place], [place]  [address]       Ph: [phone]       Fax: [phone]   RxID:   [id number] ATENOLOL 50 MG TABS (ATENOLOL) AS DIRECTED  #14 x 0   Entered by:   Scherrie Bateman, LPN   Authorized by:   Nathen May, MD, [facility]   Signed by:   Scherrie Bateman, LPN on 02/58/5277   Method used:   Electronically to        [facility] [address] (retail)       [phone]       [place], [place]  [address]       Ph: [phone]       Fax: [phone]   RxID:   [id number]

## 2010-12-26 ENCOUNTER — Other Ambulatory Visit: Payer: Self-pay

## 2010-12-29 ENCOUNTER — Other Ambulatory Visit: Payer: Self-pay

## 2010-12-30 NOTE — Progress Notes (Signed)
Summary: questions on meds  Medications Added DILTIAZEM HCL ER BEADS 240 MG XR24H-CAP (DILTIAZEM HCL ER BEADS) one daily PRADAXA 150 MG CAPS (DABIGATRAN ETEXILATE MESYLATE) one twice a day ATENOLOL 50 MG TABS (ATENOLOL) one daily       Phone Note Call from Patient Call back at Home Phone [phone]   Reason for Call: Talk to Nurse Summary of Call: pt has question re meds Initial call taken by: Roe Coombs,  December 25, 2010 1:11 PM  Follow-up for Phone Call        I talked with pt--she was unclear of the directions/medications she should be taking--I reviewed with Dr Jonette Eva should take Diltiazem ER 240mg  daily(her current dose)--in addtion she should take Atenolol 50mg  daily for 2 weeks, she should call our office after 2 weeks to let Dr Graciela Husbands know how she is doing on Atenolol or if she should try either Propranolol or Corgard    New/Updated Medications: DILTIAZEM HCL ER BEADS 240 MG XR24H-CAP (DILTIAZEM HCL ER BEADS) one daily PRADAXA 150 MG CAPS (DABIGATRAN ETEXILATE MESYLATE) one twice a day ATENOLOL 50 MG TABS (ATENOLOL) one daily Prescriptions: DILTIAZEM HCL ER BEADS 240 MG XR24H-CAP (DILTIAZEM HCL ER BEADS) one daily  #30 x 6   Entered by:   Katina Dung, RN, BSN   Authorized by:   Nathen May, MD, [facility]   Signed by:   Katina Dung, RN, BSN on 12/25/2010   Method used:   Electronically to        [facility] (retail)       [phone]       [place], [place]  [address]       Ph: [phone]       Fax: [phone]   RxID:   [id number]    Current Medications (verified): 1)  Furosemide 40 Mg Tabs (Furosemide) .... Take One Daily 2)  Pepcid 20 Mg Tabs (Famotidine) .... Take 1 Tablet By Mouth Once A Day As Needed 3)  Diltiazem Hcl Er Beads 240 Mg Xr24h-Cap (Diltiazem Hcl Er Beads) .... One Daily 4)  Alprazolam 0.25 Mg Tabs (Alprazolam) .... One Tablet By Mouth Nightly As Needed For Nerves 5)  Fish Oil  1000 Mg Caps (Omega-3 Fatty Acids) .... Once Daily 6)  B Complex  Tabs (B Complex Vitamins) .... Take One Tablet Once Daily 7)  Pradaxa 150 Mg Caps (Dabigatran Etexilate Mesylate) .... One Twice A Day 8)  Atenolol 50 Mg Tabs (Atenolol) .... One Daily  Allergies: 1)  ! Darvocet 2)  ! Lipitor I discussed with pt and she verbalized understanding

## 2011-01-01 LAB — URINALYSIS, ROUTINE W REFLEX MICROSCOPIC
Glucose, UA: NEGATIVE mg/dL
Glucose, UA: NEGATIVE mg/dL
Hgb urine dipstick: NEGATIVE
Ketones, ur: NEGATIVE mg/dL
Ketones, ur: NEGATIVE mg/dL
Nitrite: NEGATIVE
Protein, ur: NEGATIVE mg/dL
Protein, ur: NEGATIVE mg/dL
Urobilinogen, UA: 0.2 mg/dL (ref 0.0–1.0)
pH: 5 (ref 5.0–8.0)

## 2011-01-01 LAB — COMPREHENSIVE METABOLIC PANEL
ALT: 33 U/L (ref 0–35)
AST: 28 U/L (ref 0–37)
Calcium: 9.4 mg/dL (ref 8.4–10.5)
Creatinine, Ser: 0.9 mg/dL (ref 0.4–1.2)
GFR calc Af Amer: >60 mL/min (ref >60)
GFR calc non Af Amer: >60 mL/min (ref >60)
Glucose, Bld: 113 mg/dL — ABNORMAL HIGH (ref 70–99)
Sodium: 141 mEq/L (ref 135–145)
Total Protein: 6.7 g/dL (ref 6.0–8.3)

## 2011-01-01 LAB — DIFFERENTIAL
Basophils Absolute: 0 10*3/uL (ref 0.0–0.1)
Basophils Relative: 0 % (ref 0–1)
Eosinophils Absolute: 0.1 10*3/uL (ref 0.0–0.7)
Monocytes Relative: 6 % (ref 3–12)
Neutro Abs: 3.9 10*3/uL (ref 1.7–7.7)
Neutrophils Relative %: 59 % (ref 43–77)

## 2011-01-01 LAB — CBC
MCH: 31.3 pg (ref 26.0–34.0)
MCHC: 32.6 g/dL (ref 30.0–36.0)
RDW: 14.2 % (ref 11.5–15.5)

## 2011-01-01 LAB — RAPID URINE DRUG SCREEN, HOSP PERFORMED
Amphetamines: NOT DETECTED
Benzodiazepines: NOT DETECTED
Cocaine: NOT DETECTED
Tetrahydrocannabinol: NOT DETECTED

## 2011-01-01 LAB — URINE MICROSCOPIC-ADD ON

## 2011-01-01 LAB — BRAIN NATRIURETIC PEPTIDE: Pro B Natriuretic peptide (BNP): 383 pg/mL — ABNORMAL HIGH (ref 0.0–100.0)

## 2011-01-01 LAB — POCT CARDIAC MARKERS: Troponin i, poc: <0.05 ng/mL (ref 0.00–0.09)

## 2011-01-01 LAB — MAGNESIUM: Magnesium: 2.1 mg/dL (ref 1.5–2.5)

## 2011-01-01 LAB — ETHANOL: Alcohol, Ethyl (B): <5 mg/dL (ref 0–10)

## 2011-01-05 ENCOUNTER — Ambulatory Visit (INDEPENDENT_AMBULATORY_CARE_PROVIDER_SITE_OTHER): Payer: Self-pay | Admitting: Physician Assistant

## 2011-01-05 ENCOUNTER — Encounter: Payer: Self-pay | Admitting: Physician Assistant

## 2011-01-05 DIAGNOSIS — I5032 Chronic diastolic (congestive) heart failure: Secondary | ICD-10-CM

## 2011-01-05 DIAGNOSIS — I10 Essential (primary) hypertension: Secondary | ICD-10-CM

## 2011-01-05 DIAGNOSIS — I4891 Unspecified atrial fibrillation: Secondary | ICD-10-CM

## 2011-01-06 ENCOUNTER — Ambulatory Visit: Payer: Self-pay | Admitting: Physician Assistant

## 2011-01-06 ENCOUNTER — Other Ambulatory Visit: Payer: Self-pay | Admitting: Radiology

## 2011-01-06 DIAGNOSIS — I4891 Unspecified atrial fibrillation: Secondary | ICD-10-CM

## 2011-01-07 ENCOUNTER — Other Ambulatory Visit (HOSPITAL_COMMUNITY): Payer: Self-pay | Admitting: Radiology

## 2011-01-07 ENCOUNTER — Other Ambulatory Visit: Payer: Self-pay | Admitting: *Deleted

## 2011-01-07 ENCOUNTER — Ambulatory Visit (HOSPITAL_COMMUNITY): Payer: Self-pay | Attending: Internal Medicine | Admitting: Radiology

## 2011-01-07 ENCOUNTER — Ambulatory Visit (INDEPENDENT_AMBULATORY_CARE_PROVIDER_SITE_OTHER): Payer: Self-pay | Admitting: *Deleted

## 2011-01-07 DIAGNOSIS — R0989 Other specified symptoms and signs involving the circulatory and respiratory systems: Secondary | ICD-10-CM | POA: Insufficient documentation

## 2011-01-07 DIAGNOSIS — I059 Rheumatic mitral valve disease, unspecified: Secondary | ICD-10-CM | POA: Insufficient documentation

## 2011-01-07 DIAGNOSIS — I10 Essential (primary) hypertension: Secondary | ICD-10-CM

## 2011-01-07 DIAGNOSIS — R0609 Other forms of dyspnea: Secondary | ICD-10-CM | POA: Insufficient documentation

## 2011-01-07 DIAGNOSIS — I4891 Unspecified atrial fibrillation: Secondary | ICD-10-CM

## 2011-01-07 DIAGNOSIS — R5381 Other malaise: Secondary | ICD-10-CM

## 2011-01-08 LAB — CBC WITH DIFFERENTIAL/PLATELET
Basophils Relative: 0.3 % (ref 0.0–3.0)
Eosinophils Relative: 1.3 % (ref 0.0–5.0)
Hemoglobin: 11.6 g/dL — ABNORMAL LOW (ref 12.0–15.0)
Lymphocytes Relative: 43.2 % (ref 12.0–46.0)
Neutro Abs: 2.9 10*3/uL (ref 1.4–7.7)
Neutrophils Relative %: 46.8 % (ref 43.0–77.0)
RBC: 3.63 Mil/uL — ABNORMAL LOW (ref 3.87–5.11)
WBC: 6.3 10*3/uL (ref 4.5–10.5)

## 2011-01-08 LAB — BASIC METABOLIC PANEL
Calcium: 9.1 mg/dL (ref 8.4–10.5)
Chloride: 103 mEq/L (ref 96–112)
Creatinine, Ser: 1 mg/dL (ref 0.4–1.2)
Sodium: 138 mEq/L (ref 135–145)

## 2011-01-08 LAB — BRAIN NATRIURETIC PEPTIDE: Pro B Natriuretic peptide (BNP): 651.2 pg/mL — ABNORMAL HIGH (ref 0.0–100.0)

## 2011-01-13 NOTE — Progress Notes (Signed)
Tried to call patient,  Jan 12 1721 h Would lncrease her laxix to bid for one week  Needs followup with me in next few weeks thanks

## 2011-01-14 ENCOUNTER — Ambulatory Visit (HOSPITAL_BASED_OUTPATIENT_CLINIC_OR_DEPARTMENT_OTHER): Payer: Self-pay | Attending: Internal Medicine

## 2011-01-14 DIAGNOSIS — G4710 Hypersomnia, unspecified: Secondary | ICD-10-CM | POA: Insufficient documentation

## 2011-01-14 DIAGNOSIS — I4949 Other premature depolarization: Secondary | ICD-10-CM | POA: Insufficient documentation

## 2011-01-14 DIAGNOSIS — I4891 Unspecified atrial fibrillation: Secondary | ICD-10-CM | POA: Insufficient documentation

## 2011-01-15 NOTE — Assessment & Plan Note (Signed)
Summary: f/u 3wks/per checkout for poss. cardioversionsf  Medications Added CORGARD 20 MG TABS (NADOLOL) Take 1 tablet by mouth once a day for 2 weeks. INDERAL LA 60 MG XR24H-CAP (PROPRANOLOL HCL) Take 1 tablet by mouth once a day for 2 weeks (do not take with Corgard or Nadolol)        Referring Provider:  pcp Primary Provider:  Tereso Newcomer PA-C  CC:  pt states she has had symptoms of sob and fatigue thinks its related to her medication.  History of Present Illness: Primary Electrophysiologist:  Dr. Sherryl Manges  Stephanie [name] is a 60 yo female, prior patient of mine at [facility], with Afib and diastolic CHF.  She saw Dr. Graciela Husbands several weeks ago in follow up.  She was back in AFib and he continued her on Diltiazem 240 mg once daily.  She was also given a prescription for 3 separate beta blockers.  She was to take for 2 weeks to see if she could tolerate.  If not, she would change to the next beta blocker, etc.  She was also put on Pradaxa.  She was to have a CBC 10 days after starting the Pradaxa, but did not have this done yet.  She called last week with questions about how she was to take the beta blocker trial.  According to Dr. Odessa Fleming note, she is to get an echo today to follow up and assess for LVH.  She is also to get a sleep study scheduled.  Also, according to Dr. Odessa Fleming note, she may be considered for antiarrhythmic therapy and possible DCCV if still in Afib.  Note, she was on flecainide in the past, but was noncompliant with this and eventually stopped it.  She returns for follow up.  She never took her medications correctly.  She picked up atenolol and inderal and took both of these with the diltiazem.  She felt awful with this.  She was tired and more short of breath.  She called and was given the appropriate instructions.  She has now been taking diltiazem and atenolol for one week.  She is fatigued and short of breath.  She feels that her ankles are swollen.  She has been  taking her Pradaxa but has taken it two times a day some days and once daily others.  She denies any bleeding problems.  She has not had her echo or sleep study done yet.  Current Medications (verified): 1)  Furosemide 40 Mg Tabs (Furosemide) .... Take One Daily 2)  Pepcid 20 Mg Tabs (Famotidine) .... Take 1 Tablet By Mouth Once A Day As Needed 3)  Diltiazem Hcl Er Beads 240 Mg Xr24h-Cap (Diltiazem Hcl Er Beads) .... One Daily 4)  Fish Oil 1000 Mg Caps (Omega-3 Fatty Acids) .... Once Daily 5)  B Complex  Tabs (B Complex Vitamins) .... Take One Tablet Once Daily 6)  Pradaxa 150 Mg Caps (Dabigatran Etexilate Mesylate) .... One Twice A Day 7)  Atenolol 50 Mg Tabs (Atenolol) .... One Daily  Allergies: 1)  ! Darvocet 2)  ! Lipitor  Past History:  Past Medical History: Last updated: 01/20/2010 abnormal Myoview scan with anterior ischemia  cardiac catheterization 10/10 no obstructive coronary disease CHF (ICD-428.0)    a.  normal LVF by echo 07/2009 with mild LVH (?diastolic CHF) ATRIAL FIBRILLATION (ICD-427.31)    a.  s/p DCCV 07/2009 DYSLIPIDEMIA (ICD-272.4) CHRONIC VENOUS HYPERTENSION WITHOUT COMPS (ICD-459.30) SARCOIDOSIS (ICD-135)    a.  dx by eye exam in past OBESITY (  ICD-278.00) ANEMIA (ICD-285.9) LUNG NODULE (ICD-518.89)    a. 12mm RLL nodule on chest CT 07/2009    b.  needs f/u chest CT January 2011 . . . resolved on f/u CT I B S-DIARRHEAL PREDOMINATE (ICD-564.1) Hx of UNSPECIFIED HEPATITIS (ICD-573.3) DEPRESSIVE DISORDER NOT ELSEWHERE CLASSIFIED (ICD-311) ? Chronic bronchitis.  Colon polyps    a.  had colonoscopy with Dr. Arlyce Dice in 2009 (patient told to have f/u in 2012) Anemia Hypertension Glucose intolerance (hgb A1C 6.1 09/02/2009) PFTs 11/15/2009:  FEV1 95; FEV1/FVC 83; (Normal airflow, no change with bronchodilator)  Past Surgical History: Last updated: 08/29/2010 Cholecystectomy DC cardioversion x2.  Hernia repair 2009.  Hysterectomy 2002,  History right  ankle fractures x2.  Vital Signs:  Patient profile:   60 year old female Height:      65 inches Weight:      259 pounds BMI:     43.26 Pulse rate:   80 / minute Resp:     18 per minute BP sitting:   160 / 100  (left arm)  Vitals Entered By: Kem Parkinson (January 05, 2011 3:52 PM)  Physical Exam  General:  Well developed, well nourished, in no acute distress. Head:  normocephalic and atraumatic Neck:  no jvd Lungs:  clear to ausc. bilat., no wheezing or rales Heart:  normal s1-s2; irreg irreg; no murmur Abdomen:  soft, nontender, no organomegaly Extremities:  trace ankle edema bilat Neurologic:  Alert and oriented x 3, CNs 2-12 intact Skin:  warm and dry Psych:  Normal affect.   EKG  Procedure date:  01/05/2011  Findings:      atrial fibrillation, heart rate 80, normal axis, nonspecific ST-T wave changes  Impression & Recommendations:  Problem # 1:  ATRIAL FIBRILLATION (ICD-427.31) I had a long d/w her today.  We discussed how she was supposed to take the beta blocker trial.  She now understands.  She is not tolerating the atenolol.  She has been given new prescriptions for corgard and inderal.  She knows to take one at a time with her diltiazem.  She has not taken the pradaxa correctly.  We discussed how important this is.  She now knows that she is at risk of having a stroke if she has a DCCV after not taking this correctly for 3-4 weeks prior.  She was given more samples as well.  She will follow up with me in 4 weeks.  We will see how she is doing then and decide on +/- DCCV.  She will be set up for her echo.  Depending on the results of this, we may be able to consider antiarrhythmic therapy.  Of note, her heart cath in late 2010 demonstrated normal cors.  She will have a CBC today due to recent use of Pradaxa.    Orders: EKG w/ Interpretation (93000)  Problem # 2:  HYPERSOMNIA, TRANSIENT (ICD-307.43) Will go ahead and get her set up for sleep study.  Problem # 3:   CHRONIC DIASTOLIC HEART FAILURE (ICD-428.32) Volume appears stable.  Will get a bmet and bnp.  If bnp is up, adjust lasix.  Explained to her that dyspnea is likely multifactorial 2/2 chronic diast. CHF and Afib + beta blockers (fatigue).  Problem # 4:  FATIGUE (ICD-780.79) Sleep study as above.  Orders: Echocardiogram (Echo) Sleep Disorder Referral (Sleep Disorder)  Patient Instructions: 1)  You can stop taking the Atenolol. 2)  Tomorrow, start taking Inderal once daily.  Try to do this for 2 weeks.  If you feel ok on this, call us and we can refill it for you so you can continue it.   3)  If you feel bad (tired, etc.), stop Inderal after 2 weeks and start on: 4)  Nadolol (Corgard):  Take this once daily for 2 weeks.  Call when you have taken for 2 weeks. 5)  Take Pradaxa 150 mg two times a day every day (take 12 hours apart).  This is very important.  You need to do this consistently so that we can try to do a cardioversion in the future to get your heart back in rhythm.  You have to take it two times a day every day to prevent the risk of stroke at the time of the cardioversion.   6)  Your physician recommends that you return for lab work in: 01/07/11 CBC, 427.31, 428.32, 401.1, BMET 427.31, 428.32, 401.1, BNP 427.31, 428.32, 401.1 7)  Your physician has requested that you have an echocardiogram.  Echocardiography is a painless test that uses sound waves to create images of your heart. It provides your doctor with information about the size and shape of your heart and how well your heart's chambers and valves are working.  This procedure takes approximately one hour. There are no restrictions for this procedure. 8)  Your physician has recommended that you have a SPLIT NIGHT sleep study.  This test records several body functions during sleep, including:  brain activity, eye movement, oxygen and carbon dioxide blood levels, heart rate and rhythm, breathing rate and rhythm, the flow of air through  your mouth and nose, snoring, body muscle movements, and chest and belly movement. Prescriptions: INDERAL LA 60 MG XR24H-CAP (PROPRANOLOL HCL) Take 1 tablet by mouth once a day for 2 weeks (do not take with Corgard or Nadolol)  #14 x 0   Entered and Authorized by:   Tereso Newcomer PA-C   Signed by:   Tereso Newcomer PA-C on 01/05/2011   Method used:   Electronically to        [facility] (retail)       [phone]       [place], [place]  [address]       Ph: [phone]       Fax: [phone]   RxID:   [id number] CORGARD 20 MG TABS (NADOLOL) Take 1 tablet by mouth once a day for 2 weeks.  #14 x 0   Entered and Authorized by:   Tereso Newcomer PA-C   Signed by:   Tereso Newcomer PA-C on 01/05/2011   Method used:   Electronically to        [facility] (retail)       [phone]       [place], [place]  [address]       Ph: [phone]       Fax: [phone]   RxID:   [id number]

## 2011-01-18 DIAGNOSIS — G4730 Sleep apnea, unspecified: Secondary | ICD-10-CM

## 2011-01-18 DIAGNOSIS — G4710 Hypersomnia, unspecified: Secondary | ICD-10-CM

## 2011-01-22 LAB — DIFFERENTIAL
Basophils Absolute: 0 10*3/uL (ref 0.0–0.1)
Basophils Relative: 0 % (ref 0–1)
Lymphocytes Relative: 42 % (ref 12–46)
Lymphs Abs: 0.9 10*3/uL (ref 0.7–4.0)
Monocytes Relative: 2 % — ABNORMAL LOW (ref 3–12)
Neutro Abs: 2.8 10*3/uL (ref 1.7–7.7)
Neutro Abs: 3.4 10*3/uL (ref 1.7–7.7)
Neutrophils Relative %: 77 % (ref 43–77)

## 2011-01-22 LAB — COMPREHENSIVE METABOLIC PANEL
ALT: 25 U/L (ref 0–35)
BUN: 17 mg/dL (ref 6–23)
BUN: 12 mg/dL (ref 6–23)
BUN: 17 mg/dL (ref 6–23)
CO2: 30 mEq/L (ref 19–32)
CO2: 26 mEq/L (ref 19–32)
Calcium: 9.6 mg/dL (ref 8.4–10.5)
Calcium: 9.5 mg/dL (ref 8.4–10.5)
Chloride: 110 mEq/L (ref 96–112)
Creatinine, Ser: 0.9 mg/dL (ref 0.4–1.2)
Creatinine, Ser: 0.95 mg/dL (ref 0.4–1.2)
GFR calc non Af Amer: >60 mL/min (ref >60)
GFR calc non Af Amer: >60 mL/min (ref >60)
Glucose, Bld: 136 mg/dL — ABNORMAL HIGH (ref 70–99)
Glucose, Bld: 206 mg/dL — ABNORMAL HIGH (ref 70–99)
Total Bilirubin: 0.4 mg/dL (ref 0.3–1.2)
Total Protein: 7.3 g/dL (ref 6.0–8.3)

## 2011-01-22 LAB — LIPID PANEL
Cholesterol: 215 mg/dL — ABNORMAL HIGH (ref 0–200)
Total CHOL/HDL Ratio: 3.5 RATIO

## 2011-01-22 LAB — CBC
HCT: 32.5 % — ABNORMAL LOW (ref 36.0–46.0)
HCT: 31.8 % — ABNORMAL LOW (ref 36.0–46.0)
HCT: 32.9 % — ABNORMAL LOW (ref 36.0–46.0)
Hemoglobin: 10.8 g/dL — ABNORMAL LOW (ref 12.0–15.0)
Hemoglobin: 10.5 g/dL — ABNORMAL LOW (ref 12.0–15.0)
MCHC: 33.3 g/dL (ref 30.0–36.0)
MCHC: 33.2 g/dL (ref 30.0–36.0)
MCV: 96.3 fL (ref 78.0–100.0)
MCV: 96.5 fL (ref 78.0–100.0)
MCV: 94.8 fL (ref 78.0–100.0)
MCV: 96.1 fL (ref 78.0–100.0)
Platelets: 196 10*3/uL (ref 150–400)
RBC: 3.26 MIL/uL — ABNORMAL LOW (ref 3.87–5.11)
RBC: 3.38 MIL/uL — ABNORMAL LOW (ref 3.87–5.11)
RDW: 14.5 % (ref 11.5–15.5)
WBC: 7.7 10*3/uL (ref 4.0–10.5)
WBC: 5.7 10*3/uL (ref 4.0–10.5)

## 2011-01-22 LAB — HEMOGLOBIN A1C
Hgb A1c MFr Bld: 5.9 % (ref 4.6–6.1)
Mean Plasma Glucose: 123 mg/dL

## 2011-01-22 LAB — BRAIN NATRIURETIC PEPTIDE: Pro B Natriuretic peptide (BNP): 533 pg/mL — ABNORMAL HIGH (ref 0.0–100.0)

## 2011-01-22 LAB — BASIC METABOLIC PANEL
Chloride: 102 mEq/L (ref 96–112)
Creatinine, Ser: 1.1 mg/dL (ref 0.4–1.2)
GFR calc Af Amer: >60 mL/min (ref >60)
Potassium: 4.4 mEq/L (ref 3.5–5.1)
Sodium: 136 mEq/L (ref 135–145)

## 2011-01-22 LAB — URINE CULTURE: Special Requests: NEGATIVE

## 2011-01-22 LAB — IRON AND TIBC
Iron: 43 ug/dL (ref 42–135)
Saturation Ratios: 13 % — ABNORMAL LOW (ref 20–55)
UIBC: 286 ug/dL

## 2011-01-22 LAB — CARDIAC PANEL(CRET KIN+CKTOT+MB+TROPI)
CK, MB: 0.9 ng/mL (ref 0.3–4.0)
CK, MB: 1 ng/mL (ref 0.3–4.0)
Relative Index: INVALID (ref 0.0–2.5)
Relative Index: INVALID (ref 0.0–2.5)
Total CK: 61 U/L (ref 7–177)
Troponin I: 0.01 ng/mL (ref 0.00–0.06)
Troponin I: 0.01 ng/mL (ref 0.00–0.06)

## 2011-01-22 LAB — POCT CARDIAC MARKERS
CKMB, poc: <1 ng/mL — ABNORMAL LOW (ref 1.0–8.0)
Troponin i, poc: <0.05 ng/mL (ref 0.00–0.09)

## 2011-01-22 LAB — FOLATE: Folate: >20 ng/mL

## 2011-01-22 LAB — HEMOCCULT GUIAC POC 1CARD (OFFICE): Fecal Occult Bld: NEGATIVE

## 2011-01-23 ENCOUNTER — Encounter: Payer: Self-pay | Admitting: Physician Assistant

## 2011-01-27 ENCOUNTER — Ambulatory Visit: Payer: Self-pay | Admitting: Internal Medicine

## 2011-01-28 ENCOUNTER — Encounter: Payer: Self-pay | Admitting: Physician Assistant

## 2011-01-28 ENCOUNTER — Ambulatory Visit (INDEPENDENT_AMBULATORY_CARE_PROVIDER_SITE_OTHER): Payer: Self-pay | Admitting: Physician Assistant

## 2011-01-28 VITALS — BP 178/97 | HR 78 | Ht 63.0 in | Wt 260.8 lb

## 2011-01-28 DIAGNOSIS — I5042 Chronic combined systolic (congestive) and diastolic (congestive) heart failure: Secondary | ICD-10-CM

## 2011-01-28 DIAGNOSIS — R5381 Other malaise: Secondary | ICD-10-CM

## 2011-01-28 DIAGNOSIS — I5023 Acute on chronic systolic (congestive) heart failure: Secondary | ICD-10-CM | POA: Insufficient documentation

## 2011-01-28 DIAGNOSIS — I429 Cardiomyopathy, unspecified: Secondary | ICD-10-CM | POA: Insufficient documentation

## 2011-01-28 DIAGNOSIS — I10 Essential (primary) hypertension: Secondary | ICD-10-CM

## 2011-01-28 DIAGNOSIS — I4891 Unspecified atrial fibrillation: Secondary | ICD-10-CM

## 2011-01-28 DIAGNOSIS — J40 Bronchitis, not specified as acute or chronic: Secondary | ICD-10-CM | POA: Insufficient documentation

## 2011-01-28 MED ORDER — AZITHROMYCIN 250 MG PO TABS: ORAL_TABLET | ORAL | Status: AC

## 2011-01-28 MED ORDER — DILTIAZEM HCL ER 240 MG PO CP24: 240.0000 mg | ORAL_CAPSULE | Freq: Every day | ORAL | Status: DC

## 2011-01-28 MED ORDER — LISINOPRIL 20 MG PO TABS: 20.0000 mg | ORAL_TABLET | Freq: Every day | ORAL | Status: DC

## 2011-01-28 NOTE — Patient Instructions (Addendum)
Your physician recommends that you schedule a follow-up appointment in: 2-3 WEEKS WITH DR. Graciela Husbands AS PER SCOTT WEAVER, PA-C  YOU HAVE BEEN GIVEN A PRESCRIPTION FOR LAB WORK TO BE DONE AT [facility] BMET/BNP 428.42  A chest x-ray takes a picture of the organs and structures inside the chest, including the heart, lungs, and blood vessels 490 [address]. This test can show several things, including, whether the heart is enlarges; whether fluid is building up in the lungs; and whether pacemaker / defibrillator leads are still in place.  Your physician has recommended that you have a TEE/Cardioversion (DCCV) 427.31. Electrical Cardioversion uses a jolt of electricity to your heart either through paddles or wired patches attached to your chest. This is a controlled, usually prescheduled, procedure. Defibrillation is done under light anesthesia in the hospital, and you usually go home the day of the procedure. This is done to get your heart back into a normal rhythm. You are not awake for the procedure. Please see the instruction sheet given to you today.  Your physician has recommended you :START LISINOPRIL 20 MG 1 TAB ONCE DAILY, A PRESCRIPTION HAS BEEN GIVEN TO YOU TODAY FOR DILTIAZEM 240 1 TAB ONCE DAILY,  TO TAKE TO [facility] TO HAVE FILLED.  YOU HAVE BEEN GIVEN PRADAXA SAMPLES......DO NOT MISS ANY DOSES OF PRADAXA AS MISSING ONE DOSE CAN RESULT IN A STROKE, THIS IS EXTREMELY IMPORTANT

## 2011-01-28 NOTE — Assessment & Plan Note (Signed)
She has developed a cardiomyopathy that is likely rate related.  Her rate is now controlled and she is tolerating her inderal.  She has been taking Pradaxa since 3/19.  However, she has a h/o noncompliance and she admits to missing some doses.  With her decreased LVF it is important to get her back in NSR.  However, I am concerned about proceeding with a plain DCCV in her.  I would prefer she have a TEE + DCCV.   I discussed her case today with Dr. Myrtis Ser.  He agrees.  I have again discussed with her the importance of taking her Pradaxa.  We will give her more samples today.  I have explained to her that missing the Pradaxa may result in her having a devastating stroke.  She seems to understand the importance of this.  We will try to arrange her procedure in the next 1-2 weeks.

## 2011-01-28 NOTE — Assessment & Plan Note (Signed)
Start Lisinopril 20 mg qd.  Check BMET in one week.

## 2011-01-28 NOTE — Progress Notes (Signed)
History of Present Illness: Primary Electrophysiologist:  Dr. Sherryl Manges  Stephanie [name] is a 60 y.o. female , prior patient of mine at [facility], with Afib and diastolic CHF.  She saw Dr. Graciela Husbands several weeks ago and was back in AFib.  She was placed on a trial of different beta blockers but took them incorrectly.  She also was to start on Pradaxa but was not taking it BID every day.  When I last saw her I counseled her on how to do the beta blocker trial and the importance of the Pradaxa.  She had an echo on 3/21 that demonstrated moderately dilated LV, mild LVH, EF 25%, mild MR, mild LAE, mild reduced RVF and PASP 32.  Sleep study done 3/28 was not consistent with sleep apnea syndrome.  Dr. Graciela Husbands asked that she be brought in for follow up to arrange DCCV given her new systolic dysfunction in the setting of AFib.    She had her lasix increased to bid.  Since then her breathing is much better.  She is now NYHA 2-2b.  She denies orthopnea or PND.  No edema.  She has had URI symptoms over the last 2 weeks with "high fever" and cough with yellow sputum.  She has chest wall pain with coughing.  She has missed "one or two" doses of Pradaxa.  She forgot to take one day when she left work sick last week.  Of note, she is tolerating the Inderal and is taking this now.  Past Medical History  Diagnosis Date  . Coronary artery disease     no CAD by cath 11.2010  . Diastolic CHF, chronic   . Atrial fibrillation     s/p DCCV 07/2009; Pradaxa Rx  . Dyslipidemia   . Chronic venous hypertension without complications   . Sarcoidosis     dx by eye exam  . Obesity   . Anemia   . Lung nodule     12mm RLL nodule on CT Chest  07/2009  . Irritable bowel syndrome   . Hepatitis, unspecified     hx of  . Depressive disorder, not elsewhere classified   . Colon polyps   . Anemia   . Hypertension   . Glucose intolerance (impaired glucose tolerance)     hgb AIC 6.1 09/02/2009  . Hx of hysterectomy 2002  .  Ankle fracture, right     x 2    Current Outpatient Prescriptions  Medication Sig Dispense Refill  . b complex vitamins tablet Take 1 tablet by mouth daily.        . Cyanocobalamin (B-12) 100 MCG TABS Take 1 tablet by mouth daily.        . dabigatran (PRADAXA) 150 MG CAPS Take 150 mg by mouth every 12 (twelve) hours.        Marland Kitchen diltiazem (DILACOR XR) 240 MG 24 hr capsule Take 240 mg by mouth daily.        . famotidine (PEPCID) 20 MG tablet Take 20 mg by mouth 2 (two) times daily as needed.        . furosemide (LASIX) 40 MG tablet Take 80 mg by mouth daily.       . Omega-3 Fatty Acids (FISH OIL) 1000 MG CAPS Take 1 capsule by mouth daily.        . propranolol (INDERAL) 60 MG tablet Take 60 mg by mouth daily.        Marland Kitchen DISCONTD: atenolol (TENORMIN) 50 MG tablet Take 50 mg  by mouth daily.          Allergies  Allergen Reactions  . Atorvastatin   . Propoxyphene N-Acetaminophen     Vital Signs: BP 178/97  Pulse 78  Ht 5\' 3"  (1.6 m)  Wt 260 lb 12.8 oz (118.298 kg)  BMI 46.20 kg/m2  PHYSICAL EXAM: Well nourished, well developed, in no acute distress HEENT: normal Neck: no JVD Cardiac:  normal S1, S2; irregularly irregular, no murmur Lungs:  clear to auscultation bilaterally, no wheezing, rhonchi or rales Abd: soft, nontender, no hepatomegaly Ext: no edema Skin: warm and dry Neuro:  CNs 2-12 intact, no focal abnormalities noted  EKG:  AFib, HR 74, LVH, no significant change since prior tracing.  ASSESSMENT AND PLAN:

## 2011-01-28 NOTE — Assessment & Plan Note (Signed)
As noted, her LV dysfunction appears to be related to her Afib.  It would be ideal to have her on carvedilol and not on diltiazem.  However, her rate is well controlled.  We will keep her on this and plan to transition her meds once she is back in NSR.  We could also get her on digoxin in addition to the carvedilol.  But, for now she will continue inderal and diltiazem.  I will place her on Lisinopril as her BP is too high and she needs to be on an ACE.

## 2011-01-28 NOTE — Assessment & Plan Note (Signed)
Sleep study was negative for sleep apnea.

## 2011-01-28 NOTE — Assessment & Plan Note (Signed)
Her functional status is improved.  Check a BMET today and a BNP.  Add an ACE as noted.  Repeat BMET in one week.

## 2011-01-28 NOTE — Assessment & Plan Note (Signed)
She has had fever and productive cough.  Check a CXR and start zithromax.  She sees her PCP tomorrow at [facility] and can follow up further at that time.

## 2011-01-29 ENCOUNTER — Telehealth: Payer: Self-pay | Admitting: *Deleted

## 2011-01-29 ENCOUNTER — Encounter: Payer: Self-pay | Admitting: *Deleted

## 2011-01-29 ENCOUNTER — Ambulatory Visit (HOSPITAL_COMMUNITY)
Admission: RE | Admit: 2011-01-29 | Discharge: 2011-01-29 | Disposition: A | Discharge status: Home / Self Care | Payer: Self-pay | Source: Ambulatory Visit | Attending: Physician Assistant | Admitting: Physician Assistant

## 2011-01-29 DIAGNOSIS — R0989 Other specified symptoms and signs involving the circulatory and respiratory systems: Secondary | ICD-10-CM | POA: Insufficient documentation

## 2011-01-29 DIAGNOSIS — R079 Chest pain, unspecified: Secondary | ICD-10-CM | POA: Insufficient documentation

## 2011-01-29 DIAGNOSIS — R05 Cough: Secondary | ICD-10-CM | POA: Insufficient documentation

## 2011-01-29 DIAGNOSIS — J40 Bronchitis, not specified as acute or chronic: Secondary | ICD-10-CM

## 2011-01-29 DIAGNOSIS — R059 Cough, unspecified: Secondary | ICD-10-CM | POA: Insufficient documentation

## 2011-01-29 NOTE — Telephone Encounter (Signed)
Repeat bmet 427.31, 428.42 per SW/Klein....cmf

## 2011-01-29 NOTE — Telephone Encounter (Signed)
Pre procedure labs pt, cbc w/diff, bmet, cardioversion/tee 02/06/11, klein/SW

## 2011-02-03 ENCOUNTER — Telehealth: Payer: Self-pay | Admitting: Physician Assistant

## 2011-02-03 NOTE — Telephone Encounter (Signed)
ok 

## 2011-02-03 NOTE — Telephone Encounter (Signed)
She never had her BNP and BMET done.  Make sure BNP done with pre-procedure labs.

## 2011-02-03 NOTE — Telephone Encounter (Signed)
Hi Scott, I added on bnp to pre procedure labs being done on 02/04/11

## 2011-02-03 NOTE — Telephone Encounter (Signed)
See phone note

## 2011-02-04 ENCOUNTER — Other Ambulatory Visit (INDEPENDENT_AMBULATORY_CARE_PROVIDER_SITE_OTHER): Payer: Self-pay | Admitting: *Deleted

## 2011-02-04 ENCOUNTER — Other Ambulatory Visit: Payer: Self-pay | Admitting: Physician Assistant

## 2011-02-04 ENCOUNTER — Encounter: Payer: Self-pay | Admitting: *Deleted

## 2011-02-04 DIAGNOSIS — I10 Essential (primary) hypertension: Secondary | ICD-10-CM

## 2011-02-04 DIAGNOSIS — I429 Cardiomyopathy, unspecified: Secondary | ICD-10-CM

## 2011-02-04 DIAGNOSIS — I5042 Chronic combined systolic (congestive) and diastolic (congestive) heart failure: Secondary | ICD-10-CM

## 2011-02-04 DIAGNOSIS — I4891 Unspecified atrial fibrillation: Secondary | ICD-10-CM

## 2011-02-05 ENCOUNTER — Other Ambulatory Visit: Payer: Self-pay | Admitting: *Deleted

## 2011-02-05 LAB — CBC WITH DIFFERENTIAL/PLATELET
Basophils Absolute: 0 10*3/uL (ref 0.0–0.1)
Eosinophils Relative: 1.3 % (ref 0.0–5.0)
Lymphs Abs: 2.7 10*3/uL (ref 0.7–4.0)
MCV: 95.4 fl (ref 78.0–100.0)
Monocytes Absolute: 0.3 10*3/uL (ref 0.1–1.0)
Neutrophils Relative %: 41.6 % — ABNORMAL LOW (ref 43.0–77.0)
Platelets: 201 10*3/uL (ref 150.0–400.0)
RDW: 14.7 % — ABNORMAL HIGH (ref 11.5–14.6)
WBC: 5.3 10*3/uL (ref 4.5–10.5)

## 2011-02-05 LAB — BASIC METABOLIC PANEL
CO2: 29 mEq/L (ref 19–32)
Chloride: 105 mEq/L (ref 96–112)
Creatinine, Ser: 1 mg/dL (ref 0.4–1.2)
Potassium: 4 mEq/L (ref 3.5–5.1)

## 2011-02-05 LAB — PROTIME-INR
INR: 1.2 ratio — ABNORMAL HIGH (ref 0.8–1.0)
Prothrombin Time: 13.4 s — ABNORMAL HIGH (ref 10.2–12.4)

## 2011-02-06 ENCOUNTER — Ambulatory Visit (HOSPITAL_COMMUNITY)
Admission: RE | Admit: 2011-02-06 | Discharge: 2011-02-06 | Disposition: A | Discharge status: Home / Self Care | Payer: Self-pay | Source: Ambulatory Visit | Attending: Internal Medicine | Admitting: Internal Medicine

## 2011-02-06 ENCOUNTER — Telehealth: Payer: Self-pay | Admitting: *Deleted

## 2011-02-06 DIAGNOSIS — Z01810 Encounter for preprocedural cardiovascular examination: Secondary | ICD-10-CM | POA: Insufficient documentation

## 2011-02-06 DIAGNOSIS — Z5309 Procedure and treatment not carried out because of other contraindication: Secondary | ICD-10-CM | POA: Insufficient documentation

## 2011-02-06 DIAGNOSIS — I4891 Unspecified atrial fibrillation: Secondary | ICD-10-CM | POA: Insufficient documentation

## 2011-02-06 NOTE — Telephone Encounter (Signed)
Dr.Ross called from the hospital to report that the patient was there for a cardioversion but was found to be in normal sinus rhythm. She asked that we order an event monitor for atrial fib. Will send order to [facility].

## 2011-02-09 ENCOUNTER — Telehealth: Payer: Self-pay

## 2011-03-03 NOTE — Op Note (Signed)
[name], [name]             ACCOUNT NO.:  [id number]   MEDICAL RECORD NO.:  [id number]          PATIENT TYPE:  AMB   LOCATION:  SDS                          FACILITY:  [facility]   PHYSICIAN:  Leonie Man, M.D.   DATE OF BIRTH:  [birth year]   DATE OF PROCEDURE:  01/02/2008  DATE OF DISCHARGE:                               OPERATIVE REPORT   PREOPERATIVE DIAGNOSIS:  Ventral hernia.   POSTOPERATIVE DIAGNOSIS:  Ventral hernia.   PROCEDURE:  Repair of ventral hernia with Ventralex mesh.   SURGEON:  Leonie Man, MD.   ASSISTANT:  RNFA.   ANESTHESIA:  General.   Note, the patient is a 60 year old woman presenting with an  intermittently incarcerating ventral hernia presenting just to the left  of the midline and just below the umbilicus where she had a previously  placed midline scar.  She has incarcerated at least once with severe  abdominal pain, she comes to the operating room now for repair after the  risks and potential benefits of surgery have been discussed, all  questions answered and consent obtained.   PROCEDURE:  Following the induction of general anesthesia with the  patient positioned supinely, the abdomen was prepped and draped to be  included in the sterile operative field.  Positive identification of the  patient as Stephanie [name] and the operation to be done as ventral  hernia repair.  I made a transverse incision inferior to the umbilicus,  deepening this through the skin subcutaneous tissues, raising the  umbilicus superiorly to expose the previously identified umbilical  hernia and then carrying the dissection inferiorly down to the  previously identified ventral hernia.  Hernia sac is dissected free,  opened and incarcerated omentum delivered from the hernia sac.  All of  the adhesions to the hernia are then taken down freeing the abdominal  wall on all sides.  The falciform ligament, which was incarcerated  within the umbilical hernia, was also  transected and tied with #2-0 silk  ties.  The resulting defect was approximately 3 cm in diameter.  I  placed an 8 cm Ventralex patch within the incision and sutured the arms  down to the fascia with #1 Novofil sutures.  I used additional #1  Novofil sutures to secure the mesh to the anterior abdominal wall.  The  mesh was then cut and the midline wound was then closed over the patch  with interrupted #0 Novofil sutures.  The wound was then thoroughly  irrigated with saline and aspirated.  The subcutaneous tissues were then  closed with interrupted #2-0 Vicryl sutures.  Skin was closed with a  running #4-0 Monocryl suture and then reinforced with Steri-Strips.  A  sterile dressing was applied, the anesthetic reversed.  The patient  removed from the operating room to the recovery room in stable  condition, tolerating the procedure well.     Leonie Man, M.D.  Electronically Signed    PB/MEDQ  D:  01/02/2008  T:  01/02/2008  Job:  [id number]

## 2011-03-03 NOTE — Letter (Signed)
November 21, 2007    Ms. Stephanie [name]   RE:  RHEMI, BALBACH  MRN:  [id number]  /  DOB:  1951-02-01   Dear Ms. Stephanie [name]:   It is my pleasure to have treated you recently as a new patient in my  office.  I appreciate your confidence and the opportunity to participate  in your care.   Since I do have a busy inpatient endoscopy schedule and office schedule,  my office hours vary weekly.  I am, however, available for emergency  calls every day through my office.  If I cannot promptly meet an urgent  office appointment, another one of our gastroenterologists will be able  to assist you.   My well-trained staff are prepared to help you at all times.  For  emergencies after office hours, a physician from our gastroenterology  section is always available through my 24-hour answering service.   While you are under my care, I encourage discussion of your questions  and concerns, and I will be happy to return your calls as soon as I am  available.   Once again, I welcome you as a new patient and I look forward to a happy  and healthy relationship.    Sincerely,      Barbette Hair. Arlyce Dice, MD,FACG  Electronically Signed   RDK/MedQ  DD: 11/21/2007  DT: 11/21/2007  Job #: [id number]

## 2011-03-03 NOTE — Letter (Signed)
November 21, 2007    Charlynne Pander. Bruna Potter, M.D.  [address]  [place], [place] [address]   RE:  Stephanie [name], Stephanie [name]  MRN:  [id number]  /  DOB:  1951/03/29   Dear Dr. Bruna Potter:   Upon your kind referral, I had the pleasure of evaluating your patient  and I am pleased to offer my findings.  I saw Stephanie [name] in the  office today.  Enclosed is a copy of my progress note that details my  findings and recommendations.   Thank you for the opportunity to participate in your patient's care.    Sincerely,      Barbette Hair. Arlyce Dice, MD,FACG  Electronically Signed    RDK/MedQ  DD: 11/21/2007  DT: 11/21/2007  Job #: [id number]   CC:    Janine Limbo, M.D.

## 2011-03-03 NOTE — Assessment & Plan Note (Signed)
[facility]                         GASTROENTEROLOGY OFFICE NOTE   Stephanie [name], Stephanie [name]                      MRN:          [id number]  DATE:11/21/2007                            DOB:          02/27/1951    REASON FOR CONSULTATION:  Erratic bowels.   REASON:  Stephanie [name] is a 60 year old African American female referred  through the courtesy of Dr. Bruna Potter for evaluation.  She has had etic  bowels characterized by episodes of diarrhea and occasional  constipation.  There is no history of melena or hematochezia.  She also  complains of some gas bloating.  She has remote history of jaundice that  may have been due to hepatitis.  This was coincidental with gallbladder  disease, which raises the question of choledocholithiasis.   PAST MEDICAL HISTORY:  Pertinent for hypertension and hepatic disorder.  She is status post cholecystectomy and removal of fibroids.   FAMILY HISTORY:  Pertinent for mother with uterine cancer and father  with lung cancer.   MEDICATIONS:  Include clonidine, Diovan and Norvasc.   She claims to be allergic to CRESTOR and LIPITOR.   She does not smoke.  She has half a bottle of wine occasionally.  She is  single.   REVIEW OF SYSTEMS:  Reviewed and is negative.   PHYSICAL EXAMINATION:  Pulse 82.  Blood pressure 140/72.  Weight 262.  HEENT: EOMI.  PERRLA.  Sclerae are anicteric.  Conjunctivae are pink.  NECK:  Supple without thyromegaly, adenopathy or carotid bruits.  CHEST:  ear to auscultation and percussion without adventitious sounds.  CARDIAC:  Regular rhythm; normal S1 S2.  There are no murmurs, gallops  or rubs.  ABDOMEN:  Bowel sounds are normoactive.  Abdomen is soft, nontender and  nondistended.  There are no abdominal masses, tenderness, splenic  enlargement or hepatomegaly.  EXTREMITIES:  Full range of motion.  No cyanosis, clubbing or edema.  RECTAL:  Deferred.   IMPRESSION:  1. Irritable bowel syndrome.  2.  Questionable history of hepatitis versus choledocholithiasis -      status post cholecystectomy.   RECOMMENDATION:  1. Screening colonoscopy.  2. Check serologies for hepatitis B and C.     Barbette Hair. Arlyce Dice, MD,FACG  Electronically Signed    RDK/MedQ  DD: 11/21/2007  DT: 11/21/2007  Job #: [id number]   cc:   Janine Limbo, M.D.  Amalia Hailey, M.D.

## 2011-03-06 NOTE — H&P (Signed)
[facility]  Patient:    Stephanie [name], Stephanie [name]                      MRN: [id number] Adm. Date:  02/15/01 Attending:  Janine Limbo, M.D. CC:         Charlynne Pander. Bruna Potter, M.D.   History and Physical  HISTORY OF PRESENT ILLNESS:   Stephanie [name] is a 60 year old female para 5-0-0-5 who presents for abdominal hysterectomy because of menorrhagia, dysmenorrhea, and large fibroids.  The patient had an ultrasound performed in February 2002 that showed the uterus to be 22 cm x 10 cm x 12 cm.  Multiple fibroids were noted.  The patient has had several attempted endometrial biopsies.  Benign endometrium was noted in 2000.  We were unable to cannulate the uterus in 2002 after two attempts at endometrial biopsy.  Benign endocervical tissue was noted only on each of those occasions.  The patient is status post tubal ligation.  She has a history of gonorrhea and trichomoniasis in the past that were appropriately treated.  Pain medication and hormonal therapy have not relieved Stephanie [name] discomfort.  PAST OBSTETRICAL HISTORY:     The patient has had five term vaginal deliveries.  ALLERGIES:                    DARVOCET causes vomiting and nausea.  PAST MEDICAL HISTORY:         Noncontributory.  SOCIAL HISTORY:               The patient drinks alcohol socially.  She denies cigarette use and recreational drug use.  REVIEW OF SYSTEMS:            Please see history of present illness.  FAMILY HISTORY:               Diabetes and cancer.  PHYSICAL EXAMINATION  VITAL SIGNS:                  Weight 238 pounds, height 5 feet 4 inches.  HEENT:                        Within normal limits.  GENERAL:                      Very anxious appearing female.  CHEST:                        Clear.  HEART:                        Regular rate and rhythm.  ABDOMEN:                      Nontender.  There is a sense of a firm mass present.  This is difficult to outline because of the  patients obesity.  EXTREMITIES:                  Within normal limits.  NEUROLOGIC:                   Normal.  PELVIC:                       External genitalia is normal.  The vagina is normal except for relaxation.  Cervix is nontender.  The uterus is 12-14  weeks size by palpation, but again this is very difficult to outline because of the patients obese abdomen.  Adnexa:  No masses.  Rectovaginal examination confirms.  ASSESSMENT:                   1. Menorrhagia.                               2. Large fibroids.  PLAN:                         A long discussion was held with the patient about her medical and surgical options.  She wishes to proceed with abdominal hysterectomy.  She declines bilateral salpingo-oophorectomy.  She understands the indications for her procedure and she accepts the risks of, but not limited to, anesthetic complications, bleeding, infections, and possible damage to the surrounding organs. DD:  02/12/01 TD:  02/13/01 Job: [id number]

## 2011-03-06 NOTE — Op Note (Signed)
[facility]  Patient:    [name], Stephanie                      MRN: [id number] Proc. Date: 02/15/01 Adm. Date:  [id number] Attending:  Leonard Schwartz                           Operative Report  PREOPERATIVE DIAGNOSES:       1. A 22-week size fibroid uterus.                               2. Menorrhagia.                               3. Anemia (hemoglobin 9.8).  POSTOPERATIVE DIAGNOSES:      1. A 22-week size fibroid uterus.                               2. Menorrhagia.                               3. Anemia (hemoglobin 9.8).  PROCEDURE:                    Total abdominal hysterectomy.  SURGEON:                      Janine Limbo, M.D.  ASSISTANT:                    Henreitta Leber, P.A.-C.  ANESTHESIA:                   General.  DISPOSITION:                  Stephanie [name] is a 60 year old female who presents with the above-mentioned diagnosis. She understands the indications for her procedure and she accepts the risks of, but not limited to, anesthetic complications, bleeding, infections, and possible damage to the surrounding organs. The patient specifically requests that we not remove her ovaries.  FINDINGS:                     The uterus was 22-week size and it contained one very large fibroid and several smaller fibroids. The fallopian tubes were normal except for defects from a prior tubal ligation. The ovaries appeared normal. The appendix and the bowel was normal.  ESTIMATED BLOOD LOSS:         325 cc.  DESCRIPTION OF PROCEDURE:     The patient was taken to the operating room where a general anesthetic was given. The patients abdomen, perineum, and vagina were prepped with multiple layers of Betadine. A Foley catheter was placed in the bladder. The patient was sterilely draped. A midline incision was made in the abdomen and carried sharply through the subcutaneous tissue, the fascia, and the anterior peritoneum. The uterus  was elevated into our operative field. An abdominal wall retractor was placed. The bowel was back cephalad. The round ligaments were clamped bilaterally, cut, sutured, and held to the side. The bladder flap was developed anteriorly. The right upper pedicle was then clamped, cut, free tied, and then suture ligated. An identical  procedure was carried out on the opposite side. The uterine arteries were skeletonized bilaterally. Alternating from left to right, the uterine arteries, parametrial tissues, paracervical tissues, uterosacral ligaments, and vaginal angles were clamped, cut, sutured, and tied securely. The cervix was transected from the apex of the vagina. The apex of the vagina was closed using figure-of-eight sutures. The pelvis was vigorously irrigated. Hemostasis was adequate. The ureters had been identified prior to our surgery and they were again identified and felt to be without harm. The vaginal angles were tied to the uterosacral ligaments. All instruments were removed. The anterior peritoneum, the abdominal musculature, and the fascia were closed using a modified Smead-Jones suture of 0 PDS. The subcutaneous layer was irrigated. The subcutaneous layer was closed using a running suture of 0 Vicryl. The skin was reapproximated using skin staples. Sponge, needle, and instrument counts were correct on two occasions. The estimated blood loss was 325 cc. The patient tolerated her procedure well. She was noted to drain clear yellow urine at the end of our procedure. Vicryl 0 was the suture material used except for otherwise mentioned. The patient was taken to the recovery room in stable condition. DD:  02/15/01 TD:  02/15/01 Job: [id number]

## 2011-03-06 NOTE — Discharge Summary (Signed)
[facility]  Patient:    Stephanie [name], Stephanie [name]                      MRN: [id number] Adm. Date:  [id number] Disc. Date: [id number] Attending:  Leonard Schwartz Dictator:   Marquis Lunch. Adline Peals.                           Discharge Summary  DISCHARGE DIAGNOSES:          Menorrhagia, large uterine fibroids, anemia.  PROCEDURE:                    On the date of admission patient underwent a                               total abdominal hysterectomy tolerating                               procedure well.  HISTORY OF PRESENT ILLNESS:   Ms. Connon is a 60 year old female para 5-0-0-5 who presents for abdominal hysterectomy because of menorrhagia, dysmenorrhea, and large uterine fibroids.  Please see patients dictated history and physical examination for details.  PHYSICAL EXAMINATION  VITAL SIGNS:                  Weight 238 pounds, height 5 feet 4 inches.  GENERAL:                      Within normal limits.  ABDOMEN:                      Sense of a firm mass arising from the pelvis which is difficult to outline due to the patients body habitus.  It is, however, nontender.  PELVIC:                       EGBUS is within normal limits.  The vagina is normal except for pelvic relaxation.  Cervix is nontender.  Uterus is 12-14 weeks size by palpation, but again this is very difficult to outline because of the patients body habitus.  Adnexa without masses.  Rectovaginal examination confirms.  HOSPITAL COURSE:              On the date of admission patient underwent a total abdominal hysterectomy tolerating procedure well.  Postoperative course was unremarkable with patient resuming bowel and bladder function by postoperative day #2 and deemed ready for discharge home.  Postoperative hemoglobin 8 (preoperative hemoglobin 9.8).  DISCHARGE MEDICATIONS:        1. Vicodin one to two tablets q.4-6h. p.r.n.                                  pain.                  2. Ibuprofen 600 mg one tablet q.6h. with food                                  for five days, then p.r.n. pain.  3. Iron 325 mg one tablet b.i.d. for six weeks.                               4. Phenergan 25 mg one tablet q.i.d. p.r.n.                                  nausea.                               5. Stool softeners 100 mg one tablet b.i.d.                                  until bowel movements are regular.                               6. Patient is to resume her usual medications.  FOLLOW-UP:                    Patient is to present to [facility] on Feb 21, 2001 at 4 p.m. for staple removal.  She has a six week postoperative examination scheduled with Dr. Stefano Gaul on March 29, 2001 at 1:30 p.m.  DISCHARGE INSTRUCTIONS:       She was given a copy of [facility] postoperative instruction sheet.  She was further advised to avoid driving for two weeks, heavy lifting for four weeks, and intercourse for six weeks.  PATHOLOGY:                    Uterus/cervix:  Benign ectocervical and endocervical tissue.  Benign endocervical inclusion cyst.  Uterine corpora with benign proliferative endometrium, adenomyosis, submucosal intramural and subserosal leiomyomata. DD:  03/10/01 TD:  03/10/01 Job: [id number]

## 2011-03-09 NOTE — Telephone Encounter (Signed)
Patient has not call back as of 03/09/2011 to [facility]. appt for monitor

## 2011-07-13 LAB — CBC
Hemoglobin: 12
MCHC: 33
MCV: 93.4
RBC: 3.88
WBC: 4.9

## 2011-07-13 LAB — COMPREHENSIVE METABOLIC PANEL
ALT: 19
AST: 20
CO2: 28
Calcium: 9.5
Chloride: 106
GFR calc Af Amer: >60
GFR calc non Af Amer: >60
Glucose, Bld: 98
Sodium: 140
Total Bilirubin: 0.4

## 2011-07-13 LAB — DIFFERENTIAL
Basophils Absolute: 0
Basophils Relative: 1
Eosinophils Absolute: 0.1
Eosinophils Relative: 2
Lymphs Abs: 2.9
Neutrophils Relative %: 33 — ABNORMAL LOW

## 2011-07-13 LAB — URINALYSIS, ROUTINE W REFLEX MICROSCOPIC
Bilirubin Urine: NEGATIVE
Glucose, UA: NEGATIVE
Ketones, ur: NEGATIVE
Nitrite: NEGATIVE
Specific Gravity, Urine: 1.019
pH: 5.5

## 2011-07-13 LAB — URINE MICROSCOPIC-ADD ON

## 2011-07-13 LAB — APTT: aPTT: 29

## 2011-07-13 LAB — HEMOGLOBIN A1C
Hgb A1c MFr Bld: 5.9
Mean Plasma Glucose: 133

## 2011-08-16 ENCOUNTER — Emergency Department (HOSPITAL_COMMUNITY)
Admission: EM | Admit: 2011-08-16 | Discharge: 2011-08-16 | Disposition: A | Discharge status: Home / Self Care | Payer: Self-pay | Attending: Emergency Medicine | Admitting: Emergency Medicine

## 2011-08-16 ENCOUNTER — Emergency Department (HOSPITAL_COMMUNITY): Payer: Self-pay

## 2011-08-16 DIAGNOSIS — J45909 Unspecified asthma, uncomplicated: Secondary | ICD-10-CM | POA: Insufficient documentation

## 2011-08-16 DIAGNOSIS — M109 Gout, unspecified: Secondary | ICD-10-CM | POA: Insufficient documentation

## 2011-08-16 DIAGNOSIS — Z79899 Other long term (current) drug therapy: Secondary | ICD-10-CM | POA: Insufficient documentation

## 2011-08-16 DIAGNOSIS — E876 Hypokalemia: Secondary | ICD-10-CM | POA: Insufficient documentation

## 2011-08-16 DIAGNOSIS — I509 Heart failure, unspecified: Secondary | ICD-10-CM | POA: Insufficient documentation

## 2011-08-16 DIAGNOSIS — I4891 Unspecified atrial fibrillation: Secondary | ICD-10-CM | POA: Insufficient documentation

## 2011-08-16 DIAGNOSIS — E669 Obesity, unspecified: Secondary | ICD-10-CM | POA: Insufficient documentation

## 2011-08-16 DIAGNOSIS — D259 Leiomyoma of uterus, unspecified: Secondary | ICD-10-CM | POA: Insufficient documentation

## 2011-08-16 DIAGNOSIS — I10 Essential (primary) hypertension: Secondary | ICD-10-CM | POA: Insufficient documentation

## 2011-08-16 DIAGNOSIS — F411 Generalized anxiety disorder: Secondary | ICD-10-CM | POA: Insufficient documentation

## 2011-08-16 LAB — POCT I-STAT, CHEM 8
Calcium, Ion: 1.18 mmol/L (ref 1.12–1.32)
Chloride: 103 mEq/L (ref 96–112)
Glucose, Bld: 135 mg/dL — ABNORMAL HIGH (ref 70–99)
HCT: 38 % (ref 36.0–46.0)
Hemoglobin: 12.9 g/dL (ref 12.0–15.0)
Potassium: 3.2 mEq/L — ABNORMAL LOW (ref 3.5–5.1)

## 2011-08-16 LAB — URINALYSIS, ROUTINE W REFLEX MICROSCOPIC
Bilirubin Urine: NEGATIVE
Ketones, ur: NEGATIVE mg/dL
Nitrite: NEGATIVE
Urobilinogen, UA: 0.2 mg/dL (ref 0.0–1.0)

## 2011-08-16 LAB — URIC ACID: Uric Acid, Serum: 8.2 mg/dL — ABNORMAL HIGH (ref 2.4–7.0)

## 2011-10-03 ENCOUNTER — Other Ambulatory Visit: Payer: Self-pay | Admitting: Physician Assistant

## 2011-10-05 ENCOUNTER — Telehealth: Payer: Self-pay | Admitting: *Deleted

## 2011-10-05 MED ORDER — FUROSEMIDE 40 MG PO TABS: 80.0000 mg | ORAL_TABLET | Freq: Every day | ORAL | Status: DC

## 2011-10-05 NOTE — Telephone Encounter (Signed)
rx called in for lasix 80 mg daily. Danielle Rankin

## 2012-01-26 ENCOUNTER — Other Ambulatory Visit: Payer: Self-pay | Admitting: Physician Assistant

## 2012-07-16 ENCOUNTER — Emergency Department (HOSPITAL_COMMUNITY): Payer: Self-pay

## 2012-07-16 ENCOUNTER — Encounter (HOSPITAL_COMMUNITY): Payer: Self-pay | Admitting: Emergency Medicine

## 2012-07-16 ENCOUNTER — Emergency Department (HOSPITAL_COMMUNITY)
Admission: EM | Admit: 2012-07-16 | Discharge: 2012-07-16 | Disposition: A | Discharge status: Home / Self Care | Payer: Self-pay | Attending: Emergency Medicine | Admitting: Emergency Medicine

## 2012-07-16 DIAGNOSIS — I509 Heart failure, unspecified: Secondary | ICD-10-CM | POA: Insufficient documentation

## 2012-07-16 DIAGNOSIS — I2510 Atherosclerotic heart disease of native coronary artery without angina pectoris: Secondary | ICD-10-CM | POA: Insufficient documentation

## 2012-07-16 DIAGNOSIS — Z9089 Acquired absence of other organs: Secondary | ICD-10-CM | POA: Insufficient documentation

## 2012-07-16 DIAGNOSIS — I5032 Chronic diastolic (congestive) heart failure: Secondary | ICD-10-CM | POA: Insufficient documentation

## 2012-07-16 DIAGNOSIS — F0781 Postconcussional syndrome: Secondary | ICD-10-CM | POA: Insufficient documentation

## 2012-07-16 DIAGNOSIS — Z79899 Other long term (current) drug therapy: Secondary | ICD-10-CM | POA: Insufficient documentation

## 2012-07-16 DIAGNOSIS — I10 Essential (primary) hypertension: Secondary | ICD-10-CM | POA: Insufficient documentation

## 2012-07-16 MED ORDER — ALPRAZOLAM 1 MG PO TABS: 1.0000 mg | ORAL_TABLET | Freq: Every evening | ORAL | Status: DC | PRN
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
## 2012-07-22 NOTE — ED Provider Notes (Signed)
Medical screening examination/treatment/procedure(s) were performed by non-physician practitioner and as supervising physician I was immediately available for consultation/collaboration.    Nelia Shi, MD 07/22/12 [phone]

## 2012-09-30 ENCOUNTER — Other Ambulatory Visit: Payer: Self-pay | Admitting: Internal Medicine

## 2012-09-30 ENCOUNTER — Encounter (HOSPITAL_COMMUNITY): Payer: Self-pay

## 2012-09-30 ENCOUNTER — Emergency Department (HOSPITAL_COMMUNITY)
Admission: EM | Admit: 2012-09-30 | Discharge: 2012-09-30 | Discharge status: Absent without leave | Payer: Self-pay | Source: Home / Self Care | Attending: Family Medicine | Admitting: Family Medicine

## 2012-09-30 ENCOUNTER — Emergency Department (HOSPITAL_COMMUNITY)
Admission: EM | Admit: 2012-09-30 | Discharge: 2012-09-30 | Disposition: A | Discharge status: Home / Self Care | Payer: Self-pay | Source: Home / Self Care

## 2012-09-30 DIAGNOSIS — I4891 Unspecified atrial fibrillation: Secondary | ICD-10-CM

## 2012-09-30 DIAGNOSIS — I5042 Chronic combined systolic (congestive) and diastolic (congestive) heart failure: Secondary | ICD-10-CM

## 2012-09-30 DIAGNOSIS — J40 Bronchitis, not specified as acute or chronic: Secondary | ICD-10-CM

## 2012-09-30 DIAGNOSIS — I429 Cardiomyopathy, unspecified: Secondary | ICD-10-CM

## 2012-09-30 DIAGNOSIS — J45909 Unspecified asthma, uncomplicated: Secondary | ICD-10-CM

## 2012-09-30 DIAGNOSIS — I10 Essential (primary) hypertension: Secondary | ICD-10-CM

## 2012-09-30 MED ORDER — CARVEDILOL 3.125 MG PO TABS: 3.1250 mg | ORAL_TABLET | Freq: Two times a day (BID) | ORAL | Status: DC

## 2012-09-30 MED ORDER — FUROSEMIDE 40 MG PO TABS: 40.0000 mg | ORAL_TABLET | Freq: Every day | ORAL | Status: DC

## 2012-09-30 MED ORDER — INFLUENZA VIRUS VACC SPLIT PF IM SUSP: 0.5000 mL | Freq: Once | INTRAMUSCULAR | Status: DC

## 2012-09-30 MED ORDER — LISINOPRIL 20 MG PO TABS: 20.0000 mg | ORAL_TABLET | Freq: Every day | ORAL | Status: DC

## 2012-09-30 MED ORDER — SULFAMETHOXAZOLE-TRIMETHOPRIM 800-160 MG PO TABS: 1.0000 | ORAL_TABLET | Freq: Two times a day (BID) | ORAL | Status: DC

## 2012-09-30 NOTE — ED Notes (Signed)
C/o sob wheezing breaks out in sweats during the night and feels that her heart races at times

## 2012-09-30 NOTE — ED Provider Notes (Signed)
History     CSN: [id number]  Arrival date & time 09/30/12  1034   First MD Initiated Contact with Patient 09/30/12 1050      Chief Complaint  Patient presents with  .  shortness of breath      HPI 61 year old obese female with multiple medical problems listed below here for symptoms off shortness of breath and wheezing for past 2 weeks. Patient has not filled out several of her medications and has been irregular with all her medications. She was previously followed at [facility]  and seeing Dr. Clide Cliff with [facility] cardiology for her cardiomyopathy and A. fib. She wasn't put back however because of insurance issues she says she can no longer afford it. She denies any fever or chills however get sweaty off and on and had some nonproductive cough. She says it she's gets wheezy with exertion however denies orthopnea or PND. She also has chronic leg swellings. She does denies any chest pain or palpitations. Denies any abdominal pain, nausea, vomiting, bowel or urinary symptoms. Denies any change in her weight or appetite however feels very fatigued all the time.   Past Medical History  Diagnosis Date  . Coronary artery disease     no CAD by cath 11.2010  . Diastolic CHF, chronic   . Atrial fibrillation     s/p DCCV 07/2009; Pradaxa Rx  . Dyslipidemia   . Chronic venous hypertension without complications   . Sarcoidosis     dx by eye exam  . Obesity   . Anemia   . Lung nodule     12mm RLL nodule on CT Chest  07/2009  . Irritable bowel syndrome   . Hepatitis, unspecified     hx of  . Depressive disorder, not elsewhere classified   . Colon polyps   . Anemia   . Hypertension   . Glucose intolerance (impaired glucose tolerance)     hgb AIC 6.1 09/02/2009  . Hx of hysterectomy 2002  . Ankle fracture, right     x 2    Past Surgical History  Procedure Date  . Cholecystectomy   . Cardioversion     x 2  . Hernia repair 2009  . Abdominal hysterectomy     Family History   Problem Relation Age of Onset  . Pneumonia Father     deceased  . Cancer Mother     deceased  . Multiple sclerosis Mother     hx  . Emphysema Other     runs in the family  . Hypertension Father     History  Substance Use Topics  . Smoking status: Never Smoker   . Smokeless tobacco: Not on file  . Alcohol Use: 0.0 oz/week     Comment: substantial alcohol intake. Sometimes 1 bottle of  wine / day    OB History    Grav Para Term Preterm Abortions TAB SAB Ect Mult Living                  Review of Systems  Allergies  Atorvastatin and Propoxyphene-acetaminophen  Home Medications   Current Outpatient Rx  Name  Route  Sig  Dispense  Refill  . CARVEDILOL 3.125 MG PO TABS   Oral   Take 1 tablet (3.125 mg total) by mouth 2 (two) times daily with a meal.   60 tablet   3   . FUROSEMIDE 40 MG PO TABS   Oral   Take 1 tablet (40 mg total) by mouth  daily.   30 tablet   3   . LISINOPRIL 20 MG PO TABS   Oral   Take 1 tablet (20 mg total) by mouth daily.   30 tablet   2   . SULFAMETHOXAZOLE-TRIMETHOPRIM 800-160 MG PO TABS   Oral   Take 1 tablet by mouth 2 (two) times daily.   10 tablet   0     BP 161/100  Pulse 80  Temp 97.5 F (36.4 C) (Oral)  Resp 19  SpO2 100%  Physical Exam Related to obese female in no acute distress HEENT: No pallor, moist oral mucosa, no icterus, no JVD, supple neck, no thyromegaly Chest: Clear to auscultation bilaterally no wheezing rhonchi or basal crackles. Chest exam limited because of body habitus CVS: S1 and S2 irregular, no murmurs rub or gallop Abdomen: Soft, nontender, nondistended, bowel sounds present Extremities: Trace edema bilaterally  CNS: AAO x3 ED Course  Procedures (including critical care time)  Labs Reviewed - No data to display No results found. Labs ordered for CBC, BMP, lipid panel,  hemoglobin A1c, TSH  1. Bronchitis   2. Chronic combined systolic and diastolic heart failure   3. REACTIVE AIRWAY  DISEASE   4. HYPERTENSION   5. Atrial fibrillation    Bronchitis Patient has mild symptoms of bronchitis. She also complains of wheezing which is likely triggered by her CHF symptoms. Patient has not been taking her medications regularly. I will give her a course of Bactrim for a bronchitis symptoms.  Chronic CHF Her last EF was 25% and patient is on Lasix, Cardizem and lisinopril. Since the ulcer is closed she has not been on these medications regularly. I will prescribe her Lasix 40 mg daily, lisinopril 20 mg daily and will start her on Coreg 3.125 mg twice a day for her cardiomyopathy. Noncompliance does seem to be an issue here.   Atrial fibrillation Patient was seen in [facility] cardiology and placed on Prevpac set previously. Her CHADS2 is 2. (As per last lab in 2010 she also has impaired he 1C of 6.1) She cannot afford contacts at this time and is waiting for her insurance. Patient will return back for lab work next week and for a followup as well and we need to get her mind if she will be compliant to being on Coumadin which will be cheaper for her and followup regularly at [facility] from now on. She should also follow up with [facility] cardiology and will make a referral during her next visit next week.  Hypertension Blood pressure is elevated as patient is not on any medication at this time. I will resume lisinopril and Lasix and also add Coreg. Patient should follow up in one week. Has been counseled on medication compliance and regular followup.  Health maintenance  patient will receive flu vaccine today  MDM  Return to clinic next week for a blood draw and follow up for her her hypertension and to establish anticoagulation for A. fib        Krishon Adkison, MD 09/30/12 1207

## 2012-12-17 ENCOUNTER — Encounter (HOSPITAL_COMMUNITY): Payer: Self-pay | Admitting: Emergency Medicine

## 2012-12-17 ENCOUNTER — Emergency Department (INDEPENDENT_AMBULATORY_CARE_PROVIDER_SITE_OTHER)
Admission: EM | Admit: 2012-12-17 | Discharge: 2012-12-17 | Disposition: A | Discharge status: Home / Self Care | Payer: Self-pay | Source: Home / Self Care

## 2012-12-17 DIAGNOSIS — M109 Gout, unspecified: Secondary | ICD-10-CM

## 2012-12-17 MED ORDER — COLCHICINE 0.6 MG PO TABS: ORAL_TABLET | ORAL | Status: DC

## 2012-12-17 MED ORDER — HYDROCODONE-ACETAMINOPHEN 7.5-325 MG PO TABS: 1.0000 | ORAL_TABLET | ORAL | Status: DC | PRN

## 2012-12-17 NOTE — ED Provider Notes (Signed)
History     CSN: [id number]  Arrival date & time 12/17/12  1403   First MD Initiated Contact with Patient 12/17/12 1425      Chief Complaint  Patient presents with  . Foot Pain    (Consider location/radiation/quality/duration/timing/severity/associated sxs/prior treatment) HPI Comments: Morbidly obese 62 year old female complaining of right foot pain for 3 months. She initially had left foot pain in which she states was calls by gallop. She has a history of gout and now having pain in the right great toe. She states she has been in bed with foot pain for the past 3 weeks. She states that she is unable to bear weight due to the pain.   Past Medical History  Diagnosis Date  . Coronary artery disease     no CAD by cath 11.2010  . Diastolic CHF, chronic   . Atrial fibrillation     s/p DCCV 07/2009; Pradaxa Rx  . Dyslipidemia   . Chronic venous hypertension without complications   . Sarcoidosis     dx by eye exam  . Obesity   . Anemia   . Lung nodule     12mm RLL nodule on CT Chest  07/2009  . Irritable bowel syndrome   . Hepatitis, unspecified     hx of  . Depressive disorder, not elsewhere classified   . Colon polyps   . Anemia   . Hypertension   . Glucose intolerance (impaired glucose tolerance)     hgb AIC 6.1 09/02/2009  . Hx of hysterectomy 2002  . Ankle fracture, right     x 2    Past Surgical History  Procedure Laterality Date  . Cholecystectomy    . Cardioversion      x 2  . Hernia repair  2009  . Abdominal hysterectomy      Family History  Problem Relation Age of Onset  . Pneumonia Father     deceased  . Cancer Mother     deceased  . Multiple sclerosis Mother     hx  . Emphysema Other     runs in the family  . Hypertension Father     History  Substance Use Topics  . Smoking status: Never Smoker   . Smokeless tobacco: Not on file  . Alcohol Use: 0.0 oz/week     Comment: substantial alcohol intake. Sometimes 1 bottle of  wine / day     OB History   Grav Para Term Preterm Abortions TAB SAB Ect Mult Living                  Review of Systems  Constitutional: Positive for activity change. Negative for fever and chills.  HENT: Negative.   Respiratory: Negative.   Cardiovascular: Negative.   Gastrointestinal: Negative.   Musculoskeletal:       As per HPI  Skin: Negative for color change, pallor and rash.  Neurological: Negative.     Allergies  Atorvastatin and Propoxyphene-acetaminophen  Home Medications   Current Outpatient Rx  Name  Route  Sig  Dispense  Refill  . carvedilol (COREG) 3.125 MG tablet   Oral   Take 1 tablet (3.125 mg total) by mouth 2 (two) times daily with a meal.   60 tablet   3   . colchicine 0.6 MG tablet      1 tablet po now, then 1 q d for gout   10 tablet   0   . diltiazem (DILACOR XR) 240 MG 24 hr  capsule      TAKE ONE CAPSULE BY MOUTH EVERY DAY   30 capsule   5   . furosemide (LASIX) 40 MG tablet   Oral   Take 1 tablet (40 mg total) by mouth daily.   30 tablet   3   . HYDROcodone-acetaminophen (NORCO) 7.5-325 MG per tablet   Oral   Take 1 tablet by mouth every 4 (four) hours as needed for pain.   15 tablet   0   . lisinopril (PRINIVIL,ZESTRIL) 20 MG tablet   Oral   Take 1 tablet (20 mg total) by mouth daily.   30 tablet   2   . sulfamethoxazole-trimethoprim (BACTRIM DS) 800-160 MG per tablet   Oral   Take 1 tablet by mouth 2 (two) times daily.   10 tablet   0     BP 155/99  Pulse 100  Temp(Src) 98.3 F (36.8 C) (Oral)  Resp 20  SpO2 100%  Physical Exam  Nursing note and vitals reviewed. Constitutional: She is oriented to person, place, and time. She appears well-nourished. No distress.  Morbidly obese  HENT:  Head: Normocephalic and atraumatic.  Eyes: EOM are normal.  Neck: Normal range of motion. Neck supple.  Pulmonary/Chest: Effort normal. No respiratory distress.  Musculoskeletal:  Darkening pigmentation of the right great toe. Marked  tenderness and sensitivity to the right great toe. Mild swelling. Architect of the foot is abnormal in that she has pes planus. No lymphangitis or cellulitis.  Neurological: She is alert and oriented to person, place, and time. No cranial nerve deficit.  Skin: Skin is warm and dry.  Psychiatric: She has a normal mood and affect.    ED Course  Procedures (including critical care time)  Labs Reviewed - No data to display No results found.   1. Gout of big toe       MDM  Norco 7.5 one every 4 hours when necessary pain #15 Colchicine 0.6 mg one tablet by mouth daily and then one every day for one week. Will refrain from administering NSAIDs as she has several contraindications from a pathophysiologic and pharmacologic standpoint. Followup with your primary care doctor next week.        Hayden Rasmussen, NP 12/17/12 1526

## 2012-12-17 NOTE — ED Notes (Signed)
Pt is here for bilateral foot pain x3 months Sx include: swelling, pain that increases w/activity Hx of Gout Seen at ER but was not able to p/u Rx due to cost  She is alert w/no signs of acute distress.

## 2012-12-17 NOTE — ED Provider Notes (Signed)
Medical screening examination/treatment/procedure(s) were performed by resident physician or non-physician practitioner and as supervising physician I was immediately available for consultation/collaboration.   Bran Aldridge DOUGLAS MD.   Tersa Fotopoulos D Sueellen Kayes, MD 12/17/12 1541 

## 2013-01-24 ENCOUNTER — Ambulatory Visit: Payer: Self-pay | Admitting: Medical

## 2013-01-25 ENCOUNTER — Telehealth (HOSPITAL_COMMUNITY): Payer: Self-pay | Admitting: *Deleted

## 2013-01-25 NOTE — ED Notes (Signed)
Pt. called on VM yesterday @ 1500 and today @ 1420, asking to speak to Hayden Rasmussen NP about her gout.  I called pt. today and told her Onalee Hua was not here yesterday but is here today. I asked what her question was.  She said she wants a refill of her gout medicine because she does not have a PCP yet.  I told her we are not a primary care facility and don't do refills.   I told her she would have to come back and be seen again to get more gout medicine.  She is trying to get a PCP. Vassie Moselle 01/25/2013

## 2013-02-02 ENCOUNTER — Telehealth: Payer: Self-pay | Admitting: Internal Medicine

## 2013-02-02 NOTE — Telephone Encounter (Signed)
New problem   Pt is having sob/fatigue, chest tightness that has been going on for a month. Please call pt.

## 2013-02-02 NOTE — Telephone Encounter (Signed)
Spoke with pt, she is c/o chest heaviness that has been going on for about one month. She has been seen by urgent care and was told not to take her furosemide anymore because of gout. She cont to take it occ. She has swelling in her feet and ankles. She is SOB with any exertion esp when going uphill. She reports not being on pradaxa for about two years. She has an appt with a new PCP on Monday for the gout. She will see lori gerhardt np tomorrow at 10am. She will bring all her medicine to the appointment.

## 2013-02-03 ENCOUNTER — Ambulatory Visit (INDEPENDENT_AMBULATORY_CARE_PROVIDER_SITE_OTHER): Payer: BC Managed Care – PPO | Admitting: Nurse Practitioner

## 2013-02-03 ENCOUNTER — Encounter: Payer: Self-pay | Admitting: Nurse Practitioner

## 2013-02-03 ENCOUNTER — Other Ambulatory Visit: Payer: Self-pay

## 2013-02-03 VITALS — BP 164/90 | HR 97 | Ht 64.0 in | Wt 255.0 lb

## 2013-02-03 DIAGNOSIS — I10 Essential (primary) hypertension: Secondary | ICD-10-CM

## 2013-02-03 DIAGNOSIS — I4891 Unspecified atrial fibrillation: Secondary | ICD-10-CM

## 2013-02-03 DIAGNOSIS — R0609 Other forms of dyspnea: Secondary | ICD-10-CM

## 2013-02-03 DIAGNOSIS — R0600 Dyspnea, unspecified: Secondary | ICD-10-CM

## 2013-02-03 DIAGNOSIS — I5041 Acute combined systolic (congestive) and diastolic (congestive) heart failure: Secondary | ICD-10-CM

## 2013-02-03 DIAGNOSIS — R0989 Other specified symptoms and signs involving the circulatory and respiratory systems: Secondary | ICD-10-CM

## 2013-02-03 LAB — CBC WITH DIFFERENTIAL/PLATELET
Basophils Absolute: 0 10*3/uL (ref 0.0–0.1)
Basophils Relative: 0.6 % (ref 0.0–3.0)
Eosinophils Absolute: 0.1 10*3/uL (ref 0.0–0.7)
Eosinophils Relative: 1.7 % (ref 0.0–5.0)
HCT: 33.7 % — ABNORMAL LOW (ref 36.0–46.0)
Hemoglobin: 11.2 g/dL — ABNORMAL LOW (ref 12.0–15.0)
Lymphocytes Relative: 44.5 % (ref 12.0–46.0)
Lymphs Abs: 2.5 10*3/uL (ref 0.7–4.0)
MCHC: 33.2 g/dL (ref 30.0–36.0)
MCV: 90.1 fl (ref 78.0–100.0)
Monocytes Absolute: 0.4 10*3/uL (ref 0.1–1.0)
Monocytes Relative: 6.5 % (ref 3.0–12.0)
Neutro Abs: 2.6 10*3/uL (ref 1.4–7.7)
Neutrophils Relative %: 46.7 % (ref 43.0–77.0)
Platelets: 238 10*3/uL (ref 150.0–400.0)
RBC: 3.74 Mil/uL — ABNORMAL LOW (ref 3.87–5.11)
RDW: 15.1 % — ABNORMAL HIGH (ref 11.5–14.6)
WBC: 5.6 10*3/uL (ref 4.5–10.5)

## 2013-02-03 LAB — BASIC METABOLIC PANEL
BUN: 19 mg/dL (ref 6–23)
CO2: 32 mEq/L (ref 19–32)
Calcium: 9.1 mg/dL (ref 8.4–10.5)
Chloride: 102 mEq/L (ref 96–112)
Creatinine, Ser: 1 mg/dL (ref 0.4–1.2)
GFR: 70.94 mL/min (ref >60.00)
Glucose, Bld: 135 mg/dL — ABNORMAL HIGH (ref 70–99)
Potassium: 3.2 mEq/L — ABNORMAL LOW (ref 3.5–5.1)
Sodium: 141 mEq/L (ref 135–145)

## 2013-02-03 LAB — BRAIN NATRIURETIC PEPTIDE: Pro B Natriuretic peptide (BNP): 257 pg/mL — ABNORMAL HIGH (ref 0.0–100.0)

## 2013-02-03 MED ORDER — DABIGATRAN ETEXILATE MESYLATE 150 MG PO CAPS: 150.0000 mg | ORAL_CAPSULE | Freq: Two times a day (BID) | ORAL | Status: DC

## 2013-02-03 MED ORDER — CARVEDILOL 3.125 MG PO TABS: 3.1250 mg | ORAL_TABLET | Freq: Two times a day (BID) | ORAL | Status: DC

## 2013-02-03 MED ORDER — POTASSIUM CHLORIDE CRYS ER 20 MEQ PO TBCR: 20.0000 meq | EXTENDED_RELEASE_TABLET | Freq: Every day | ORAL | Status: DC

## 2013-02-03 NOTE — Progress Notes (Signed)
Stephanie [name] Date of Birth: 10/05/1951 Medical Record #[id number]  History of Present Illness: Stephanie [name] is seen back today for a work in visit. She is seen for Stephanie [name]. She has not been here in 2 years. She has multiple issues which include atrial fib, morbid obesity, combined systolic and diastolic heart failure, noncompliance, HLD, sarcoid, obesity, lung nodule (resolved on follow up CT), history of hepatitis, gout, anemia, HTN, and glucose intolerance.   Last seen 2 years ago when back in atrial fib. TEE+CV was trying to be arranged. She has not been seen since. EF was down at that time to 25%. There was concern for a rate related CM. She has had documented noncompliance.   She comes in today. She is here alone. She is short of breath. She is off most of her medicines. Had lost her insurance. Has not felt well since at least September or November of last year. More short of breath. More chest heaviness. More swelling. Drinking lots of water and juice and stays thirsty. On Ibuprofen for pain. Now that she has insurance she decided to get back in here since her symptoms are not getting better. She is back in atrial fib as well.   Current Outpatient Prescriptions on File Prior to Visit  Medication Sig Dispense Refill  . carvedilol (COREG) 3.125 MG tablet Take 1 tablet (3.125 mg total) by mouth 2 (two) times daily with a meal.  60 tablet  3  . colchicine 0.6 MG tablet 1 tablet po now, then 1 q d for gout  10 tablet  0  . diltiazem (DILACOR XR) 240 MG 24 hr capsule TAKE ONE CAPSULE BY MOUTH EVERY DAY  30 capsule  5  . furosemide (LASIX) 40 MG tablet Take 1 tablet (40 mg total) by mouth daily.  30 tablet  3  . HYDROcodone-acetaminophen (NORCO) 7.5-325 MG per tablet Take 1 tablet by mouth every 4 (four) hours as needed for pain.  15 tablet  0  . lisinopril (PRINIVIL,ZESTRIL) 20 MG tablet Take 1 tablet (20 mg total) by mouth daily.  30 tablet  2  . sulfamethoxazole-trimethoprim (BACTRIM DS)  800-160 MG per tablet Take 1 tablet by mouth 2 (two) times daily.  10 tablet  0  . [DISCONTINUED] dabigatran (PRADAXA) 150 MG CAPS Take 150 mg by mouth every 12 (twelve) hours.        . [DISCONTINUED] propranolol (INDERAL) 60 MG tablet Take 60 mg by mouth daily.         No current facility-administered medications on file prior to visit.    Allergies  Allergen Reactions  . Atorvastatin     Body aches and pains--stiffness.   . Propoxyphene-Acetaminophen     Past Medical History  Diagnosis Date  . Coronary artery disease     no CAD by cath 11.2010  . Diastolic CHF, chronic   . Atrial fibrillation     s/p DCCV 07/2009; Pradaxa Rx  . Dyslipidemia   . Chronic venous hypertension without complications   . Sarcoidosis     dx by eye exam  . Obesity   . Anemia   . Lung nodule     12mm RLL nodule on CT Chest  07/2009  . Irritable bowel syndrome   . Hepatitis, unspecified     hx of  . Depressive disorder, not elsewhere classified   . Colon polyps   . Anemia   . Hypertension   . Glucose intolerance (impaired glucose tolerance)  hgb AIC 6.1 09/02/2009  . Hx of hysterectomy 2002  . Ankle fracture, right     x 2  . Noncompliance     Past Surgical History  Procedure Laterality Date  . Cholecystectomy    . Cardioversion      x 2  . Hernia repair  2009  . Abdominal hysterectomy      History  Smoking status  . Never Smoker   Smokeless tobacco  . Not on file    History  Alcohol Use  . 0.0 oz/week    Comment: substantial alcohol intake. Sometimes 1 bottle of  wine / day    Family History  Problem Relation Age of Onset  . Pneumonia Father     deceased  . Cancer Mother     deceased  . Multiple sclerosis Mother     hx  . Emphysema Other     runs in the family  . Hypertension Father     Review of Systems: The review of systems is per the HPI.  All other systems were reviewed and are negative.  Physical Exam: BP 164/90  Pulse 97  Ht 5\' 4"  (1.626 m)  Wt  255 lb (115.667 kg)  BMI 43.75 kg/m2 Patient is pleasant and in no acute distress. She is morbidly obese. Skin is warm and dry. Color is normal.  HEENT is unremarkable. Normocephalic/atraumatic. PERRL. Sclera are nonicteric. Neck is supple. No masses. No JVD. Lungs are clear. Cardiac exam shows an irregular rhythm. Rate is fairly well controlled. Abdomen is obese but soft. Extremities are with 1+ edema. Gait and ROM are intact. No gross neurologic deficits noted.  LABORATORY DATA: Pending  EKG today shows atrial fib with a fairly well controlled VR at 93.     Chemistry      Component Value Date/Time   NA 141 08/16/2011 2247   K 3.2* 08/16/2011 2247   CL 103 08/16/2011 2247   CO2 29 02/04/2011 1657   BUN 19 08/16/2011 2247   CREATININE 0.90 08/16/2011 2247      Component Value Date/Time   CALCIUM 9.3 02/04/2011 1657   ALKPHOS 61 07/29/2010 1713   AST 28 07/29/2010 1713   ALT 33 07/29/2010 1713   BILITOT 0.4 07/29/2010 1713     Lab Results  Component Value Date   WBC 5.3 02/04/2011   HGB 12.9 08/16/2011   HCT 38.0 08/16/2011   MCV 95.4 02/04/2011   PLT 201.0 02/04/2011    Assessment / Plan: 1. Combined systolic and diastolic heart failure - Last EF was 25% - suspected to be rate related. She says she is agreeable to restarting her medicines. I have restarted the Coreg at 3.125 mg BID. Stop NSAID use, and will update the echo. I am not restarting her Diltiazem in light of the poor EF already documented.   2. PAF - she is back in atrial fib today. She is agreeable to restarting Pradaxa 150 mg BID. No NSAID use. She is counseled about the importance of compliance. I suspect she will need repeat TEE+DCCV in a month.   3. Morbid obesity   4. Noncompliance - chronic issue  5. HTN - restarting Coreg.   Will need to see her back in about 7 to 10 days to titrate medicines.   Patient is agreeable to this plan and will call if any problems develop in the interim.   Stephanie Macadamia,  RN, ANP-C [facility] [address] [place], [place]  [address]  

## 2013-02-03 NOTE — Patient Instructions (Addendum)
We need to check labs today  We need to restart your Coreg (Carvedilol) 3.125 mg to take 2 times a day  We need to restart your Pradaxa 150 mg two times a day  Do not restart your Diltiazem  Do not use Ibuprofen  We need to update your ultrasound of your heart  Restrict your salt  We need to see you in about 10 days  Call the [facility] office at [phone] if you have any questions, problems or concerns.

## 2013-02-06 ENCOUNTER — Telehealth: Payer: Self-pay | Admitting: Family Medicine

## 2013-02-06 ENCOUNTER — Encounter: Payer: Self-pay | Admitting: Medical

## 2013-02-06 ENCOUNTER — Ambulatory Visit (INDEPENDENT_AMBULATORY_CARE_PROVIDER_SITE_OTHER): Payer: BC Managed Care – PPO | Admitting: Medical

## 2013-02-06 VITALS — BP 132/88 | HR 58 | Temp 97.9°F | Resp 16 | Ht 63.5 in | Wt 253.0 lb

## 2013-02-06 DIAGNOSIS — D649 Anemia, unspecified: Secondary | ICD-10-CM

## 2013-02-06 DIAGNOSIS — J45909 Unspecified asthma, uncomplicated: Secondary | ICD-10-CM

## 2013-02-06 DIAGNOSIS — I509 Heart failure, unspecified: Secondary | ICD-10-CM

## 2013-02-06 DIAGNOSIS — F1010 Alcohol abuse, uncomplicated: Secondary | ICD-10-CM

## 2013-02-06 DIAGNOSIS — R7301 Impaired fasting glucose: Secondary | ICD-10-CM

## 2013-02-06 DIAGNOSIS — E669 Obesity, unspecified: Secondary | ICD-10-CM

## 2013-02-06 DIAGNOSIS — M109 Gout, unspecified: Secondary | ICD-10-CM

## 2013-02-06 DIAGNOSIS — G4700 Insomnia, unspecified: Secondary | ICD-10-CM

## 2013-02-06 DIAGNOSIS — I4891 Unspecified atrial fibrillation: Secondary | ICD-10-CM

## 2013-02-06 DIAGNOSIS — Z91199 Patient's noncompliance with other medical treatment and regimen due to unspecified reason: Secondary | ICD-10-CM

## 2013-02-06 DIAGNOSIS — Z9119 Patient's noncompliance with other medical treatment and regimen: Secondary | ICD-10-CM

## 2013-02-06 LAB — IRON AND TIBC
%SAT: 24 % (ref 20–55)
Iron: 81 ug/dL (ref 42–145)
TIBC: 341 ug/dL (ref 250–470)
UIBC: 260 ug/dL (ref 125–400)

## 2013-02-06 LAB — FERRITIN: Ferritin: 305 ng/mL — ABNORMAL HIGH (ref 10–291)

## 2013-02-06 MED ORDER — COLCHICINE 0.6 MG PO TABS: 0.6000 mg | ORAL_TABLET | Freq: Two times a day (BID) | ORAL | Status: DC

## 2013-02-06 MED ORDER — ALPRAZOLAM 0.25 MG PO TABS: ORAL_TABLET | ORAL | Status: DC

## 2013-02-06 NOTE — Addendum Note (Signed)
Addended by: Jac Canavan on: 02/06/2013 04:22 PM   Modules accepted: Orders

## 2013-02-06 NOTE — Telephone Encounter (Signed)
Patient is aware of her appointment for her PFT's on Wednesday 02/10/13 @ 1 pm [facility]. Cls

## 2013-02-06 NOTE — Progress Notes (Signed)
Subjective: Here as a new patient today.  Was seeing [facility] before they closed last year.  Currently just sees cardiology and establishing today with [place].   Just saw cardiology, Dr. Graciela Husbands recently, but hadn't had cardiology f/u since 2012 due to insurance situation.  Was just put on Coreg, potassium, lasix, and pradaxa last week by cardiology.   Goes back again May 6 for f/u, will likely be having cardioversion and repeat echo soon.  Has labs set for next week regarding potassium.      Reviewed her history and cardiology notes from last week, she has multiple issues which include atrial fib, morbid obesity, combined systolic and diastolic heart failure, noncompliance, HLD, sarcoid, obesity, lung nodule (resolved on follow up CT), history of hepatitis, gout, anemia, HTN, and glucose intolerance.   Her current concerns include fatigue, SOB in general, wheezing in the mornings, problems with sleep, wonders if her asthma is acting up.  she is checking her weights daily.    Been up the last 2 nights feeling bad, can't sleep, feels heart fluttering.   Has been on xanax in the past to help with anxiety and sleep.  Would like help with sleep, requests some xanax to help.   Has been drinking wine to help relax, but this doesn't help of late.  She notes she was drinking a lot more, but lately been only drinking 1-2 glasses of wine nightly.     She does report some intermittent gout flares, just started having gout attacks this past year.  Not currently taking any gout medication.  She does report some blurred vision at times, some polydipsia and polyuria, and has hx/o borderline diabetes.    Hx/o iron deficiency anemia. She had colonoscopy 5 years ago, but not sure about results.  Not on iron.    Past Medical History  Diagnosis Date  . Coronary artery disease     no CAD by cath 11.2010  . Diastolic CHF, chronic   . Atrial fibrillation     s/p DCCV 07/2009; Pradaxa Rx  . Dyslipidemia   . Chronic venous  hypertension without complications   . Sarcoidosis     dx by eye exam  . Obesity   . Anemia   . Lung nodule     12mm RLL nodule on CT Chest  07/2009  . Irritable bowel syndrome   . Hepatitis, unspecified     hx of  . Depressive disorder, not elsewhere classified   . Colon polyps   . Anemia   . Hypertension   . Glucose intolerance (impaired glucose tolerance)     hgb AIC 6.1 09/02/2009  . Hx of hysterectomy 2002  . Ankle fracture, right     x 2  . Noncompliance    ROS as in subjective  Objective: Filed Vitals:   02/06/13 1435  BP: 132/88  Pulse: 58  Temp: 97.9 F (36.6 C)  Resp: 16    General appearance: alert, no distress, WD/WN, [name], obese Neck: supple, no lymphadenopathy, no thyromegaly, no masses, no JVD Heart: irregular rhythm, seems to be in afib, no murmurs Lungs: CTA bilaterally, no wheezes, rhonchi, or rales Abdomen: +bs, soft, non tender, non distended, no masses, no hepatomegaly, no splenomegaly Pulses: 2+, normal cap refill Ext: slight bilat 1+ nonpitting edema   Assessment: Encounter Diagnoses  Name Primary?  . CHF (congestive heart failure) Yes  . A-fib   . Unspecified asthma   . Impaired fasting blood sugar   . Gout   .  Obesity, unspecified   . Insomnia   . Excessive drinking alcohol   . History of noncompliance with medical treatment     Plan: Complicated medical history, hx/o noncompliance, just restarted on medications last week per cardiology for afib, and will be returning for echo and likely cardioversion in a few weeks.  She seems to be in denial about her cardiac status.   I advised that I don't think her fatigue is related to asthma, but more likely related to cardiac source.  We will order PFTs for baseline in general.  Will check some labs today regarding anemia, impaired fasting glucose, uric acid level.  I advised daily weight checks, and if >5 lb daily gain, call cardiology.   Avoid added salt.  C/t current medications and f/u  with cardiology for labs next week as planned.    Discussed her diangoses, labs today, sleep hygiene, afib/CHF diagnosis, discussed her excessive alcohol intake.  Short term low dose xanax for sleep only.  Advised she cut back on alcohol and get to the point in the next week or 2 where she weans down to rare alcohol use.  Follow-up pending labs, cardiology f/u next week

## 2013-02-08 ENCOUNTER — Encounter (HOSPITAL_COMMUNITY): Payer: BC Managed Care – PPO

## 2013-02-08 ENCOUNTER — Telehealth: Payer: Self-pay | Admitting: *Deleted

## 2013-02-08 ENCOUNTER — Other Ambulatory Visit: Payer: Self-pay

## 2013-02-08 ENCOUNTER — Ambulatory Visit (HOSPITAL_COMMUNITY): Payer: BC Managed Care – PPO | Attending: Cardiovascular Disease | Admitting: Radiology

## 2013-02-08 DIAGNOSIS — R0609 Other forms of dyspnea: Secondary | ICD-10-CM | POA: Insufficient documentation

## 2013-02-08 DIAGNOSIS — I059 Rheumatic mitral valve disease, unspecified: Secondary | ICD-10-CM | POA: Insufficient documentation

## 2013-02-08 DIAGNOSIS — R0989 Other specified symptoms and signs involving the circulatory and respiratory systems: Secondary | ICD-10-CM

## 2013-02-08 DIAGNOSIS — R0600 Dyspnea, unspecified: Secondary | ICD-10-CM

## 2013-02-08 DIAGNOSIS — I10 Essential (primary) hypertension: Secondary | ICD-10-CM | POA: Insufficient documentation

## 2013-02-08 DIAGNOSIS — I5041 Acute combined systolic (congestive) and diastolic (congestive) heart failure: Secondary | ICD-10-CM

## 2013-02-08 NOTE — Progress Notes (Signed)
Echocardiogram performed.  

## 2013-02-08 NOTE — Telephone Encounter (Signed)
Pt here to 2 d echo - reports she can not afford Pradaxa - that it cost $400.  Pt was given co-pay card and samples of medications.  She will call back if unable to obtain assistance with co-pay card.

## 2013-02-09 ENCOUNTER — Telehealth: Payer: Self-pay | Admitting: Family Medicine

## 2013-02-09 NOTE — Telephone Encounter (Signed)
Pt called and left message on my voice mail that the new gout medicine you called in for her was over $100.00 and the old one was only $4.00.  She wants you to call the old one in to [facility] on [address].

## 2013-02-09 NOTE — Telephone Encounter (Signed)
This is one of your pt

## 2013-02-10 ENCOUNTER — Other Ambulatory Visit (INDEPENDENT_AMBULATORY_CARE_PROVIDER_SITE_OTHER): Payer: BC Managed Care – PPO

## 2013-02-10 ENCOUNTER — Encounter (HOSPITAL_COMMUNITY): Payer: BC Managed Care – PPO

## 2013-02-10 ENCOUNTER — Other Ambulatory Visit: Payer: Self-pay

## 2013-02-10 ENCOUNTER — Other Ambulatory Visit: Payer: Self-pay | Admitting: Medical

## 2013-02-10 ENCOUNTER — Telehealth: Payer: Self-pay | Admitting: Medical

## 2013-02-10 DIAGNOSIS — I10 Essential (primary) hypertension: Secondary | ICD-10-CM

## 2013-02-10 LAB — BASIC METABOLIC PANEL
BUN: 20 mg/dL (ref 6–23)
CO2: 29 mEq/L (ref 19–32)
Calcium: 8.8 mg/dL (ref 8.4–10.5)
Chloride: 100 mEq/L (ref 96–112)
Creatinine, Ser: 1.1 mg/dL (ref 0.4–1.2)
GFR: 63.68 mL/min (ref >60.00)
Glucose, Bld: 189 mg/dL — ABNORMAL HIGH (ref 70–99)
Potassium: 3.8 mEq/L (ref 3.5–5.1)
Sodium: 138 mEq/L (ref 135–145)

## 2013-02-10 MED ORDER — COLCHICINE 0.6 MG PO TABS: 0.6000 mg | ORAL_TABLET | Freq: Two times a day (BID) | ORAL | Status: DC

## 2013-02-10 MED ORDER — FUROSEMIDE 40 MG PO TABS: 40.0000 mg | ORAL_TABLET | Freq: Every day | ORAL | Status: DC

## 2013-02-10 NOTE — Telephone Encounter (Signed)
Patient is aware of the coupon card up front for her to pick up. CLS

## 2013-02-10 NOTE — Telephone Encounter (Signed)
Do we have coupon cards for Colcyrs?  If so, get to patient.   Otherwise, what is the "old medication" she is referring to?

## 2013-02-13 ENCOUNTER — Ambulatory Visit (HOSPITAL_COMMUNITY)
Admission: RE | Admit: 2013-02-13 | Discharge: 2013-02-13 | Disposition: A | Discharge status: Home / Self Care | Payer: BC Managed Care – PPO | Source: Ambulatory Visit | Attending: Medical | Admitting: Medical

## 2013-02-13 ENCOUNTER — Ambulatory Visit: Payer: Self-pay | Admitting: Medical

## 2013-02-13 DIAGNOSIS — Z5189 Encounter for other specified aftercare: Secondary | ICD-10-CM | POA: Insufficient documentation

## 2013-02-13 DIAGNOSIS — J45909 Unspecified asthma, uncomplicated: Secondary | ICD-10-CM | POA: Insufficient documentation

## 2013-02-13 MED ORDER — ALBUTEROL SULFATE (5 MG/ML) 0.5% IN NEBU
2.5000 mg | INHALATION_SOLUTION | Freq: Once | RESPIRATORY_TRACT | Status: AC
  Administered 2013-02-13: 2.5 mg via RESPIRATORY_TRACT

## 2013-02-14 NOTE — Telephone Encounter (Signed)
DONE

## 2013-02-21 ENCOUNTER — Encounter: Payer: Self-pay | Admitting: Internal Medicine

## 2013-02-21 ENCOUNTER — Ambulatory Visit (INDEPENDENT_AMBULATORY_CARE_PROVIDER_SITE_OTHER): Payer: BC Managed Care – PPO | Admitting: Internal Medicine

## 2013-02-21 ENCOUNTER — Encounter: Payer: Self-pay | Admitting: *Deleted

## 2013-02-21 ENCOUNTER — Encounter: Payer: Self-pay | Admitting: Medical

## 2013-02-21 VITALS — BP 127/109 | HR 67 | Ht 63.5 in | Wt 254.0 lb

## 2013-02-21 DIAGNOSIS — I5023 Acute on chronic systolic (congestive) heart failure: Secondary | ICD-10-CM

## 2013-02-21 DIAGNOSIS — F3289 Other specified depressive episodes: Secondary | ICD-10-CM

## 2013-02-21 DIAGNOSIS — I429 Cardiomyopathy, unspecified: Secondary | ICD-10-CM

## 2013-02-21 DIAGNOSIS — I4891 Unspecified atrial fibrillation: Secondary | ICD-10-CM

## 2013-02-21 DIAGNOSIS — F329 Major depressive disorder, single episode, unspecified: Secondary | ICD-10-CM

## 2013-02-21 MED ORDER — CARVEDILOL 6.25 MG PO TABS: 6.2500 mg | ORAL_TABLET | Freq: Two times a day (BID) | ORAL | Status: DC

## 2013-02-21 NOTE — Assessment & Plan Note (Addendum)
Persistent, she is on anticoagulation now for 2 weeks. I stressed with her the importance of medication compliance. Medication costs are going to be an issue.  We will plan cardioversion next week

## 2013-02-21 NOTE — Assessment & Plan Note (Signed)
She has acute on chronic heart failure in the setting of atrial fibrillation with a rapid rate and some degree of hypertension. She was started on anticoagulation almost 3 weeks ago as we will plan to undertake cardioversion next week. In the event that she is unable to hold sinus rhythm either dofetilide or amiodarone would be our next choice.  I suspect much of her symptoms are related to atrial fibrillation. She does have some sleep-disordered breathing but had a negative sleep study about a year ago. She is also having problems with gout which may be been aggravated by her diuretic.  We discussed the implications of cardiomyopathy that may be tachycardia mediated. I stressed the importance of medication compliance. With her rapid rate, today, we will increase her carvedilol 3.125--6.25 twice a day. We will also continue her lisinopril. I will anticipate the addition of hydralazine/nitrates at the time of her cardioversion.

## 2013-02-21 NOTE — Progress Notes (Signed)
Patient Care Team: Jac Canavan, PA-C as PCP - General (Family Medicine)   HPI  Stephanie [name] is a 62 y.o. female seen in followup for atrial fibrillation and dyspnea related to presumed diastolic and the systolic heart failure. Pmhx aslo notable for morbid obesity,  noncompliance, HLD, sarcoid, obesity, lung nodule (resolved on follow up CT), history of hepatitis, gout, anemia, HTN, and glucose intolerance  Recently she has been treated with combinations of diltiazem and furosemide    She underwent Myoview scan in November 2010 which was abnormal with evidence of apical ischemia. She underwent subsequent catheterization which demonstrated no obstructive coronary disease. Echo cardiogram around that same time demonstrated mild left ventricular hypertrophy with wall thicknesses of 11-13 mm   Last seen 2 years ago when back in atrial fib. TEE+CV was trying to be arranged. She has not been seen since. EF was down at that time to 25%  She was seen 3 weeks ago by LG who started her on pradaxa and coreg Echo repeat>>  25-30%  She also has complaints of gout  Past Medical History  Diagnosis Date  . Coronary artery disease     no CAD by cath 11.2010  . Diastolic CHF, chronic   . Atrial fibrillation     s/p DCCV 07/2009; Pradaxa Rx  . Dyslipidemia   . Chronic venous hypertension without complications   . Sarcoidosis     dx by eye exam  . Obesity   . Anemia   . Lung nodule     12mm RLL nodule on CT Chest  07/2009  . Irritable bowel syndrome   . Hepatitis, unspecified     hx of  . Depressive disorder, not elsewhere classified   . Colon polyps   . Anemia   . Hypertension   . Glucose intolerance (impaired glucose tolerance)     hgb AIC 6.1 09/02/2009  . Hx of hysterectomy 2002  . Ankle fracture, right     x 2  . Noncompliance     Past Surgical History  Procedure Laterality Date  . Cholecystectomy    . Cardioversion      x 2  . Hernia repair  2009  . Abdominal  hysterectomy    . Colonoscopy      age 20yo, [place]    Current Outpatient Prescriptions  Medication Sig Dispense Refill  . ALPRAZolam (XANAX) 0.25 MG tablet 1-2 tablets QHS sleep prn  20 tablet  0  . carvedilol (COREG) 3.125 MG tablet Take 1 tablet (3.125 mg total) by mouth 2 (two) times daily with a meal.  60 tablet  6  . cyanocobalamin 1000 MCG tablet Take 100 mcg by mouth daily.      . dabigatran (PRADAXA) 150 MG CAPS Take 1 capsule (150 mg total) by mouth every 12 (twelve) hours.  60 capsule  3  . furosemide (LASIX) 40 MG tablet Take 1 tablet (40 mg total) by mouth daily.  30 tablet  0  . lisinopril (PRINIVIL,ZESTRIL) 20 MG tablet Take 1 tablet (20 mg total) by mouth daily.  30 tablet  2  . potassium chloride SA (K-DUR,KLOR-CON) 20 MEQ tablet Take 1 tablet (20 mEq total) by mouth daily.  30 tablet  6  . [DISCONTINUED] propranolol (INDERAL) 60 MG tablet Take 60 mg by mouth daily.         No current facility-administered medications for this visit.    Allergies  Allergen Reactions  . Albuterol     Palpitations, intolerance  .  Atorvastatin     Body aches and pains--stiffness.   . Propoxyphene-Acetaminophen     Review of Systems negative except from HPI and PMH  Physical Exam BP 127/109  Pulse 67  Ht 5' 3.5" (1.613 m)  Wt 254 lb (115.214 kg)  BMI 44.28 kg/m2  SpO2 97% Well developed and well nourished in no acute distress HENT normal E scleral and icterus clear Neck Supple JVP flat; carotids brisk and full Clear to ausculation Irregularly irregular rapid, no murmurs gallops or rub Soft with active bowel sounds No clubbing cyanosis Trace Edema Alert and oriented, grossly normal motor and sensory function Skin Warm and Dry  ECG from 418 demonstrated atrial fibrillation  Assessment and  Plan

## 2013-02-21 NOTE — Assessment & Plan Note (Signed)
Probably rate related. We will add hydralazine nitrates as above and continue beta blockers and ACE inhibitors

## 2013-02-21 NOTE — Patient Instructions (Addendum)
Your physician has recommended you make the following change in your medication:  1) Increase coreg (carvedilol) to 6.25 mg one tablet by mouth twice daily.  Your physician has recommended that you have a Cardioversion (DCCV). Electrical Cardioversion uses a jolt of electricity to your heart either through paddles or wired patches attached to your chest. This is a controlled, usually prescheduled, procedure. Defibrillation is done under light anesthesia in the hospital, and you usually go home the day of the procedure. This is done to get your heart back into a normal rhythm. You are not awake for the procedure. Please see the instruction sheet given to you today.  Your physician recommends that you schedule a follow-up appointment in: 3 weeks with Dr. Graciela Husbands.  We will call to schedule you a follow up appointment with Kristian Covey, PA before you leave- follow up anxiety/ sleep medications.

## 2013-02-22 ENCOUNTER — Encounter: Payer: Self-pay | Admitting: Medical

## 2013-02-22 ENCOUNTER — Ambulatory Visit (INDEPENDENT_AMBULATORY_CARE_PROVIDER_SITE_OTHER): Payer: BC Managed Care – PPO | Admitting: Medical

## 2013-02-22 VITALS — BP 130/90 | HR 68 | Temp 98.1°F | Resp 18 | Wt <= 1120 oz

## 2013-02-22 DIAGNOSIS — F411 Generalized anxiety disorder: Secondary | ICD-10-CM

## 2013-02-22 DIAGNOSIS — E119 Type 2 diabetes mellitus without complications: Secondary | ICD-10-CM

## 2013-02-22 DIAGNOSIS — M109 Gout, unspecified: Secondary | ICD-10-CM

## 2013-02-22 DIAGNOSIS — G4700 Insomnia, unspecified: Secondary | ICD-10-CM

## 2013-02-22 MED ORDER — ALLOPURINOL 100 MG PO TABS: ORAL_TABLET | ORAL | Status: DC

## 2013-02-22 MED ORDER — METHYLPREDNISOLONE 4 MG PO KIT: PACK | ORAL | Status: DC

## 2013-02-22 MED ORDER — ALPRAZOLAM 0.5 MG PO TABS: ORAL_TABLET | ORAL | Status: DC
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
## 2013-02-22 NOTE — Patient Instructions (Addendum)
Gout - your uric acid is elevated which is related to your gout flares.    Begin Allopurinol  Daily for gout prevention.   Sometimes starting this medication can induce a gout attack.  Thus, as you begin Allopurinol, if you get a flare up of gout, then begin Medrol Dosepak (steroid) to calm this down.  I have sent this medicatoin along with Allopurinol to the pharmacy.    Regarding new diagnosis of diabetes  We will hold off on medications for the time being  Please work on eating a healthy diabetic diet  Avoid sweets, cakes, pies, sweet tea, regular soda, lots of starches  Avoid fried foods, fast food  Eat 3-5 fruits daily, 1 serving per meal  Eat green and non potato vegetables all you want  Starches - including bread, pasta, rice, cereal, oatmeal - limit to 1 serving per meal or less.   For example, 1/2 cup of rice OR 1/2 cup cereal OR 2 slices of bread OR small roll OR 1/2 cup pasta  Drink plenty of water  Counseling - consdier counseling to help with your issues  Dr. Miguel Aschoff [address], [place], [place] [address] [phone]   Insomnia Insomnia is frequent trouble falling and/or staying asleep. Insomnia can be a long term problem or a short term problem. Both are common. Insomnia can be a short term problem when the wakefulness is related to a certain stress or worry. Long term insomnia is often related to ongoing stress during waking hours and/or poor sleeping habits. Overtime, sleep deprivation itself can make the problem worse. Every little thing feels more severe because you are overtired and your ability to cope is decreased. CAUSES   Stress, anxiety, and depression.  Poor sleeping habits.  Distractions such as TV in the bedroom.  Naps close to bedtime.  Engaging in emotionally charged conversations before bed.  Technical reading before sleep.  Alcohol and other sedatives. They may make the problem worse. They can hurt normal sleep patterns and  normal dream activity.  Stimulants such as caffeine for several hours prior to bedtime.  Pain syndromes and shortness of breath can cause insomnia.  Exercise late at night.  Changing time zones may cause sleeping problems (jet lag). It is sometimes helpful to have someone observe your sleeping patterns. They should look for periods of not breathing during the night (sleep apnea). They should also look to see how long those periods last. If you live alone or observers are uncertain, you can also be observed at a sleep clinic where your sleep patterns will be professionally monitored. Sleep apnea requires a checkup and treatment. Give your caregivers your medical history. Give your caregivers observations your family has made about your sleep.  SYMPTOMS   Not feeling rested in the morning.  Anxiety and restlessness at bedtime.  Difficulty falling and staying asleep. TREATMENT   Your caregiver may prescribe treatment for an underlying medical disorders. Your caregiver can give advice or help if you are using alcohol or other drugs for self-medication. Treatment of underlying problems will usually eliminate insomnia problems.  Medications can be prescribed for short time use. They are generally not recommended for lengthy use.  Over-the-counter sleep medicines are not recommended for lengthy use. They can be habit forming.  You can promote easier sleeping by making lifestyle changes such as:  Using relaxation techniques that help with breathing and reduce muscle tension.  Exercising earlier in the day.  Changing your diet and the time of  your last meal. No night time snacks.  Establish a regular time to go to bed.  Counseling can help with stressful problems and worry.  Soothing music and white noise may be helpful if there are background noises you cannot remove.  Stop tedious detailed work at least one hour before bedtime. HOME CARE INSTRUCTIONS   Keep a diary. Inform your  caregiver about your progress. This includes any medication side effects. See your caregiver regularly. Take note of:  Times when you are asleep.  Times when you are awake during the night.  The quality of your sleep.  How you feel the next day. This information will help your caregiver care for you.  Get out of bed if you are still awake after 15 minutes. Read or do some quiet activity. Keep the lights down. Wait until you feel sleepy and go back to bed.  Keep regular sleeping and waking hours. Avoid naps.  Exercise regularly.  Avoid distractions at bedtime. Distractions include watching television or engaging in any intense or detailed activity like attempting to balance the household checkbook.  Develop a bedtime ritual. Keep a familiar routine of bathing, brushing your teeth, climbing into bed at the same time each night, listening to soothing music. Routines increase the success of falling to sleep faster.  Use relaxation techniques. This can be using breathing and muscle tension release routines. It can also include visualizing peaceful scenes. You can also help control troubling or intruding thoughts by keeping your mind occupied with boring or repetitive thoughts like the old concept of counting sheep. You can make it more creative like imagining planting one beautiful flower after another in your backyard garden.  During your day, work to eliminate stress. When this is not possible use some of the previous suggestions to help reduce the anxiety that accompanies stressful situations. MAKE SURE YOU:   Understand these instructions.  Will watch your condition.  Will get help right away if you are not doing well or get worse. Document Released: 10/02/2000 Document Revised: 12/28/2011 Document Reviewed: 11/02/2007 [facility] Patient Information 2013 [place], [place].

## 2013-02-27 ENCOUNTER — Encounter (HOSPITAL_COMMUNITY): Payer: Self-pay | Admitting: Gastroenterology

## 2013-02-27 ENCOUNTER — Ambulatory Visit (HOSPITAL_COMMUNITY)
Admission: RE | Admit: 2013-02-27 | Discharge: 2013-02-27 | Disposition: A | Discharge status: Home / Self Care | Payer: BC Managed Care – PPO | Source: Ambulatory Visit | Attending: Cardiology | Admitting: Cardiology

## 2013-02-27 ENCOUNTER — Encounter (HOSPITAL_COMMUNITY): Payer: Self-pay | Admitting: Anesthesiology

## 2013-02-27 ENCOUNTER — Ambulatory Visit (HOSPITAL_COMMUNITY): Payer: BC Managed Care – PPO | Admitting: Anesthesiology

## 2013-02-27 ENCOUNTER — Encounter (HOSPITAL_COMMUNITY)
Admission: RE | Disposition: A | Discharge status: Home / Self Care | Payer: Self-pay | Source: Ambulatory Visit | Attending: Cardiology

## 2013-02-27 ENCOUNTER — Other Ambulatory Visit: Payer: Self-pay | Admitting: Internal Medicine

## 2013-02-27 DIAGNOSIS — J45909 Unspecified asthma, uncomplicated: Secondary | ICD-10-CM | POA: Insufficient documentation

## 2013-02-27 DIAGNOSIS — D649 Anemia, unspecified: Secondary | ICD-10-CM | POA: Insufficient documentation

## 2013-02-27 DIAGNOSIS — D869 Sarcoidosis, unspecified: Secondary | ICD-10-CM | POA: Insufficient documentation

## 2013-02-27 DIAGNOSIS — I87309 Chronic venous hypertension (idiopathic) without complications of unspecified lower extremity: Secondary | ICD-10-CM | POA: Insufficient documentation

## 2013-02-27 DIAGNOSIS — E785 Hyperlipidemia, unspecified: Secondary | ICD-10-CM | POA: Insufficient documentation

## 2013-02-27 DIAGNOSIS — I4891 Unspecified atrial fibrillation: Secondary | ICD-10-CM | POA: Insufficient documentation

## 2013-02-27 DIAGNOSIS — R7309 Other abnormal glucose: Secondary | ICD-10-CM | POA: Insufficient documentation

## 2013-02-27 DIAGNOSIS — I5023 Acute on chronic systolic (congestive) heart failure: Secondary | ICD-10-CM | POA: Insufficient documentation

## 2013-02-27 DIAGNOSIS — F3289 Other specified depressive episodes: Secondary | ICD-10-CM | POA: Insufficient documentation

## 2013-02-27 DIAGNOSIS — Z888 Allergy status to other drugs, medicaments and biological substances status: Secondary | ICD-10-CM | POA: Insufficient documentation

## 2013-02-27 DIAGNOSIS — I428 Other cardiomyopathies: Secondary | ICD-10-CM | POA: Insufficient documentation

## 2013-02-27 DIAGNOSIS — Z79899 Other long term (current) drug therapy: Secondary | ICD-10-CM | POA: Insufficient documentation

## 2013-02-27 DIAGNOSIS — K589 Irritable bowel syndrome without diarrhea: Secondary | ICD-10-CM | POA: Insufficient documentation

## 2013-02-27 DIAGNOSIS — I10 Essential (primary) hypertension: Secondary | ICD-10-CM | POA: Insufficient documentation

## 2013-02-27 DIAGNOSIS — I509 Heart failure, unspecified: Secondary | ICD-10-CM | POA: Insufficient documentation

## 2013-02-27 DIAGNOSIS — K219 Gastro-esophageal reflux disease without esophagitis: Secondary | ICD-10-CM | POA: Insufficient documentation

## 2013-02-27 DIAGNOSIS — M109 Gout, unspecified: Secondary | ICD-10-CM | POA: Insufficient documentation

## 2013-02-27 DIAGNOSIS — I2510 Atherosclerotic heart disease of native coronary artery without angina pectoris: Secondary | ICD-10-CM | POA: Insufficient documentation

## 2013-02-27 DIAGNOSIS — I739 Peripheral vascular disease, unspecified: Secondary | ICD-10-CM | POA: Insufficient documentation

## 2013-02-27 DIAGNOSIS — F329 Major depressive disorder, single episode, unspecified: Secondary | ICD-10-CM | POA: Insufficient documentation

## 2013-02-27 HISTORY — PX: CARDIOVERSION: SHX1299

## 2013-02-27 HISTORY — DX: Nausea with vomiting, unspecified: R11.2

## 2013-02-27 HISTORY — DX: Other specified postprocedural states: Z98.890

## 2013-02-27 SURGERY — CARDIOVERSION
Anesthesia: General

## 2013-02-27 MED ORDER — SODIUM CHLORIDE 0.9 % IV SOLN
INTRAVENOUS | Status: DC
  Administered 2013-02-27: 500 mL via INTRAVENOUS

## 2013-02-27 MED ORDER — PROPOFOL 10 MG/ML IV BOLUS
INTRAVENOUS | Status: DC | PRN
  Administered 2013-02-27: 50 mg via INTRAVENOUS

## 2013-02-27 NOTE — Progress Notes (Signed)
Discussed EKG  Results with Dr Moises Blood  Released for discharged

## 2013-02-27 NOTE — Anesthesia Postprocedure Evaluation (Signed)
  Anesthesia Post-op Note  Patient: Stephanie [name]  Procedure(s) Performed: Procedure(s): CARDIOVERSION (N/A)  Patient Location: PACU  Anesthesia Type:General  Level of Consciousness: awake, alert , oriented and patient cooperative  Airway and Oxygen Therapy: Patient Spontanous Breathing and Patient connected to nasal cannula oxygen  Post-op Pain: none  Post-op Assessment: Post-op Vital signs reviewed, Patient's Cardiovascular Status Stable, Respiratory Function Stable, Patent Airway and No signs of Nausea or vomiting  Post-op Vital Signs: Reviewed and stable  Complications: No apparent anesthesia complications

## 2013-02-27 NOTE — Anesthesia Preprocedure Evaluation (Signed)
Anesthesia Evaluation  Patient identified by MRN, date of birth, ID band Patient awake    Reviewed: Allergy & Precautions, H&P , NPO status , Patient's Chart, lab work & pertinent test results, reviewed documented beta blocker date and time   Airway Mallampati: I TM Distance: >3 FB Neck ROM: Full    Dental  (+) Teeth Intact and Dental Advisory Given   Pulmonary shortness of breath, with exertion and at rest, asthma ,          Cardiovascular hypertension, Pt. on home beta blockers + CAD, + Peripheral Vascular Disease and +CHF Rhythm:Irregular Rate:Abnormal  ECHO from 01-2013 noted:  EF 25%   Neuro/Psych    GI/Hepatic GERD-  Medicated and Controlled,(+) Hepatitis -History of Hepatitis   Endo/Other    Renal/GU      Musculoskeletal   Abdominal   Peds  Hematology   Anesthesia Other Findings   Reproductive/Obstetrics                           Anesthesia Physical Anesthesia Plan  ASA: III  Anesthesia Plan: General   Post-op Pain Management:    Induction: Intravenous  Airway Management Planned: Mask  Additional Equipment:   Intra-op Plan:   Post-operative Plan:   Informed Consent:   Plan Discussed with:   Anesthesia Plan Comments:         Anesthesia Quick Evaluation

## 2013-02-27 NOTE — Preoperative (Signed)
Beta Blockers   Reason not to administer Beta Blockers:Coreg this morning 

## 2013-02-27 NOTE — Procedures (Signed)
Electrical Cardioversion Procedure Note Sashia Campas [id number] September 03, 1951  Procedure: Electrical Cardioversion Indications:  Atrial Fibrillation  Procedure Details Consent: Risks of procedure as well as the alternatives and risks of each were explained to the (patient/caregiver).  Consent for procedure obtained. Time Out: Verified patient identification, verified procedure, site/side was marked, verified correct patient position, special equipment/implants available, medications/allergies/relevent history reviewed, required imaging and test results available.  Performed  Patient placed on cardiac monitor, pulse oximetry, supplemental oxygen as necessary.  Sedation given: Diprovan 50 mg IV administered by anesthesia. Pacer pads placed anterior and posterior chest.  Cardioverted 1 time(s).  Cardioverted at 150J.  Evaluation Findings: Post procedure EKG shows: NSR Complications: None Patient did tolerate procedure well.   Olga Millers 02/27/2013, 2:16 PM

## 2013-02-27 NOTE — H&P (Signed)
Stephanie [name]  02/21/2013 2:15 PM   Office Visit  MRN:  [id number]   Description: 62 year old female  Provider: Duke Salvia, MD  Department: [facility]         Diagnoses    Acute on chronic systolic heart failure    -  Primary    428.23    Cardiomyopathy, secondary        425.9    Atrial fibrillation        427.31    Depressive disorder, not elsewhere classified        311          Progress Notes    Duke Salvia, MD at 02/21/2013  3:54 PM    Status: Sign at close encounter                   Patient Care Team: Jac Canavan, PA-C as PCP - General (Family Medicine)     HPI   Stephanie [name] is a 62 y.o. female    seen in followup for atrial fibrillation and dyspnea   Related to presumed diastolic and the systolic heart failure.  pmhx aslo notable for morbid obesity, co  noncompliance, HLD, sarcoid, obesity, lung nodule (resolved on follow up CT), history of hepatitis, gout, anemia, HTN, and glucose intolerance           recently she has been treated with combinations of diltiazem and furosemide      She underwent Myoview scan in November 2010 which was abnormal with evidence of apical ischemia. She underwent subsequent catheterization which demonstrated no obstructive coronary disease. Echo cardiogram around that same time demonstrated mild left ventricular hypertrophy with wall thicknesses of 11-13 mm    Last seen 2 years ago when back in atrial fib. TEE+CV was trying to be arranged. She has not been seen since. EF was down at that time to 25%   She was seen 3 weeks ago by LG who started her on pradaxa and coreg Echo repeat>>  25-30%  She also has complaints of gout    Past Medical History   Diagnosis  Date   .  Coronary artery disease         no CAD by cath 11.2010   .  Diastolic CHF, chronic     .  Atrial fibrillation         s/p DCCV 07/2009; Pradaxa Rx   .  Dyslipidemia     .  Chronic venous hypertension without complications      .  Sarcoidosis         dx by eye exam   .  Obesity     .  Anemia     .  Lung nodule         12mm RLL nodule on CT Chest  07/2009   .  Irritable bowel syndrome     .  Hepatitis, unspecified         hx of   .  Depressive disorder, not elsewhere classified     .  Colon polyps     .  Anemia     .  Hypertension     .  Glucose intolerance (impaired glucose tolerance)         hgb AIC 6.1 09/02/2009   .  Hx of hysterectomy  2002   .  Ankle fracture, right         x 2   .  Noncompliance  Past Surgical History   Procedure  Laterality  Date   .  Cholecystectomy       .  Cardioversion           x 2   .  Hernia repair    2009   .  Abdominal hysterectomy       .  Colonoscopy           age 89yo, [place]         Current Outpatient Prescriptions   Medication  Sig  Dispense  Refill   .  ALPRAZolam (XANAX) 0.25 MG tablet  1-2 tablets QHS sleep prn   20 tablet   0   .  carvedilol (COREG) 3.125 MG tablet  Take 1 tablet (3.125 mg total) by mouth 2 (two) times daily with a meal.   60 tablet   6   .  cyanocobalamin 1000 MCG tablet  Take 100 mcg by mouth daily.         .  dabigatran (PRADAXA) 150 MG CAPS  Take 1 capsule (150 mg total) by mouth every 12 (twelve) hours.   60 capsule   3   .  furosemide (LASIX) 40 MG tablet  Take 1 tablet (40 mg total) by mouth daily.   30 tablet   0   .  lisinopril (PRINIVIL,ZESTRIL) 20 MG tablet  Take 1 tablet (20 mg total) by mouth daily.   30 tablet   2   .  potassium chloride SA (K-DUR,KLOR-CON) 20 MEQ tablet  Take 1 tablet (20 mEq total) by mouth daily.   30 tablet   6   .  [DISCONTINUED] propranolol (INDERAL) 60 MG tablet  Take 60 mg by mouth daily.               No current facility-administered medications for this visit.         Allergies   Allergen  Reactions   .  Albuterol         Palpitations, intolerance   .  Atorvastatin         Body aches and pains--stiffness.    .  Propoxyphene-Acetaminophen          Review of  Systems negative except from HPI and PMH   Physical Exam BP 127/109  Pulse 67  Ht 5' 3.5" (1.613 m)  Wt 254 lb (115.214 kg)  BMI 44.28 kg/m2  SpO2 97% Well developed and well nourished in no acute distress HENT normal E scleral and icterus clear Neck Supple JVP flat; carotids brisk and full Clear to ausculation Irregularly irregular rapid, no murmurs gallops or rub Soft with active bowel sounds No clubbing cyanosis Trace Edema Alert and oriented, grossly normal motor and sensory function Skin Warm and Dry   ECG from 418 demonstrated atrial fibrillation   Assessment and  Plan            Acute on chronic systolic heart failure - Duke Salvia, MD at 02/21/2013  3:52 PM    Status: Written Related Problem: Acute on chronic systolic heart failure           She has acute on chronic heart failure in the setting of atrial fibrillation with a rapid rate and some degree of hypertension. She was started on anticoagulation almost 3 weeks ago as we will plan to undertake cardioversion next week. In the event that she is unable to hold sinus rhythm either dofetilide or amiodarone would be our next choice.   I  suspect much of her symptoms are related to atrial fibrillation. She does have some sleep-disordered breathing but had a negative sleep study about a year ago. She is also having problems with gout which may be been aggravated by her diuretic.   We discussed the implications of cardiomyopathy that may be tachycardia mediated. I stressed the importance of medication compliance. With her rapid rate, today, we will increase her carvedilol 3.125--6.25 twice a day. We will also continue her lisinopril. I will anticipate the addition of hydralazine/nitrates at the time of her cardioversion.         Cardiomyopathy, secondary - Duke Salvia, MD at 02/21/2013  3:53 PM    Status: Written Related Problem: Cardiomyopathy, secondary           Probably rate related. We will add hydralazine  nitrates as above and continue beta blockers and ACE inhibitors         Atrial fibrillation - Duke Salvia, MD at 02/21/2013  3:53 PM    Status: Linus Orn Related Problem: Atrial fibrillation           Persistent, she is on anticoagulation now for 2 weeks. I stressed with her the importance of medication compliance. Medication costs are going to be an issue.   We will plan cardioversion next week    Patient presents for DCCV; she has been taking pradaxa as prescribed. No changes Olga Millers

## 2013-02-27 NOTE — Transfer of Care (Signed)
Immediate Anesthesia Transfer of Care Note  Patient: Stephanie [name]  Procedure(s) Performed: Procedure(s): CARDIOVERSION (N/A)  Patient Location: PACU  Anesthesia Type:General  Level of Consciousness: awake, alert  and patient cooperative  Airway & Oxygen Therapy: Patient Spontanous Breathing and Patient connected to nasal cannula oxygen  Post-op Assessment: Report given to PACU RN and Post -op Vital signs reviewed and stable  Post vital signs: Reviewed and stable  Complications: No apparent anesthesia complications

## 2013-02-28 ENCOUNTER — Encounter (HOSPITAL_COMMUNITY): Payer: Self-pay | Admitting: Cardiology

## 2013-03-03 ENCOUNTER — Other Ambulatory Visit: Payer: Self-pay | Admitting: Internal Medicine

## 2013-03-21 ENCOUNTER — Telehealth: Payer: Self-pay | Admitting: Internal Medicine

## 2013-03-21 NOTE — Telephone Encounter (Signed)
Pt requesting  refill of lisinopril at [facility], pt out needs asap

## 2013-03-22 ENCOUNTER — Ambulatory Visit (INDEPENDENT_AMBULATORY_CARE_PROVIDER_SITE_OTHER): Payer: BC Managed Care – PPO | Admitting: Internal Medicine

## 2013-03-22 ENCOUNTER — Encounter: Payer: Self-pay | Admitting: Internal Medicine

## 2013-03-22 ENCOUNTER — Encounter: Payer: Self-pay | Admitting: *Deleted

## 2013-03-22 VITALS — BP 141/103 | HR 83 | Ht 64.0 in | Wt 255.0 lb

## 2013-03-22 DIAGNOSIS — I5041 Acute combined systolic (congestive) and diastolic (congestive) heart failure: Secondary | ICD-10-CM

## 2013-03-22 DIAGNOSIS — R5383 Other fatigue: Secondary | ICD-10-CM

## 2013-03-22 DIAGNOSIS — R0602 Shortness of breath: Secondary | ICD-10-CM

## 2013-03-22 DIAGNOSIS — F329 Major depressive disorder, single episode, unspecified: Secondary | ICD-10-CM

## 2013-03-22 DIAGNOSIS — M109 Gout, unspecified: Secondary | ICD-10-CM

## 2013-03-22 DIAGNOSIS — I5023 Acute on chronic systolic (congestive) heart failure: Secondary | ICD-10-CM

## 2013-03-22 DIAGNOSIS — I429 Cardiomyopathy, unspecified: Secondary | ICD-10-CM

## 2013-03-22 DIAGNOSIS — I5032 Chronic diastolic (congestive) heart failure: Secondary | ICD-10-CM

## 2013-03-22 DIAGNOSIS — R0609 Other forms of dyspnea: Secondary | ICD-10-CM

## 2013-03-22 DIAGNOSIS — I4891 Unspecified atrial fibrillation: Secondary | ICD-10-CM

## 2013-03-22 DIAGNOSIS — R5381 Other malaise: Secondary | ICD-10-CM

## 2013-03-22 DIAGNOSIS — I10 Essential (primary) hypertension: Secondary | ICD-10-CM

## 2013-03-22 DIAGNOSIS — F3289 Other specified depressive episodes: Secondary | ICD-10-CM

## 2013-03-22 MED ORDER — POTASSIUM CHLORIDE CRYS ER 20 MEQ PO TBCR: 20.0000 meq | EXTENDED_RELEASE_TABLET | Freq: Every day | ORAL | Status: DC

## 2013-03-22 MED ORDER — LISINOPRIL 20 MG PO TABS: 20.0000 mg | ORAL_TABLET | Freq: Every day | ORAL | Status: DC

## 2013-03-22 MED ORDER — AMIODARONE HCL 400 MG PO TABS: ORAL_TABLET | ORAL | Status: DC

## 2013-03-22 NOTE — Assessment & Plan Note (Signed)
The patient has recurrent and persistent atrial fibrillation in the context of a left atrium of 46 mm. She has left ventricular dysfunction and congestive symptoms and modestly rapid rate. The first issue is whether the atrial fibrillation and his rate continues to her cardiomyopathy and some restoration of sinus rhythm becomes important. She failed cardioversion in the absence of antiarrhythmic therapy; her QT interval precludes dofetilide in left ventricular function precludes all of the drugs except amiodarone. We have reviewed side effects which are quite wanting to her that she is willing to undertake a short term exposure in the setting of the restore sinus rhythm and into see what impact it has a left ventricular function. Have a low threshold for further referral to Dr. Johney Frame for consideration of catheter ablation although in the context of sleep apnea therapy is incredibly important to undertake as part of the therapeutic benefit of procedure

## 2013-03-22 NOTE — Patient Instructions (Addendum)
Start taking Amiodarone 400mg  twice a day for 2 weeks.  THEN decrease Amiodarone to 400mg  once a day for 2 weeks  Your physician has recommended that you have a Cardioversion (DCCV). Electrical Cardioversion uses a jolt of electricity to your heart either through paddles or wired patches attached to your chest. This is a controlled, usually prescheduled, procedure. Defibrillation is done under light anesthesia in the hospital, and you usually go home the day of the procedure. This is done to get your heart back into a normal rhythm. You are not awake for the procedure. Please see the instruction sheet given to you today.   You will need lab work before your Cardioversion. We will send you an instruction letter about your procedure & when to come in for lab work.

## 2013-03-22 NOTE — Assessment & Plan Note (Signed)
Profound fatigue continues to be a problem. This may be related to obesity, deconditioning, I should note that I think about it I failed to measure her oxygenation with ambulation. Sleep study from 2012 failed to identify sleep apnea with an AHI of 4

## 2013-03-22 NOTE — Telephone Encounter (Signed)
lmtcb Debbie Emma Schupp RN  

## 2013-03-22 NOTE — Assessment & Plan Note (Signed)
As above She also has mixed congestive heart barrier and diuresis will be important. Again hopefully restoration of sinus rhythm results improve left ventricular function

## 2013-03-22 NOTE — Assessment & Plan Note (Addendum)
There is discrepant information if LV systolic function. Catheterization January 2012 demonstrated no obstructive coronary disease not withstanding a markedly abnormal Myoview have elevated pressures and normal left ventricular function. In most recent echo 4/14 demonstrated ejection fraction of 25-30% with moderate LVH  We also discussed the role hydralazine nitrates to further initiation until after we started the amiodarone

## 2013-03-22 NOTE — Assessment & Plan Note (Addendum)
There is discrepant information as to LV functionwe'll continue to work on diuresis and restoration ofsinus rhythm

## 2013-03-22 NOTE — Telephone Encounter (Signed)
Follow up  Pt states she is at [facility] and the prescription cost $155. She wants to know can something cheaper be called in.

## 2013-03-22 NOTE — Progress Notes (Signed)
Patient Care Team: Jac Canavan, PA-C as PCP - General (Family Medicine)   HPI  Stephanie [name] is a 62 y.o. female Seen in followup for atrial fibrillation in the context of morbid obesity sarcoid and mild left ventricular hypertrophy. Catheterization 2012 demonstrated nonobstructive coronary disease and normal left ventricular function and elevated filling pressures  By echocardiogram, recurrent cardiomyopathy was demonstratedwith EF 25-30% April 2014 She underwent cardioversion may/14 with the hopes of restoring sinus rhythm and seeing recovery of LV systolic function.  She has no significant changes in functional status since cardioversion.  She also sleep disordered breathing  Past Medical History  Diagnosis Date  . Coronary artery disease     no CAD by cath 11.2010  . Diastolic CHF, chronic   . Atrial fibrillation     s/p DCCV 07/2009; Pradaxa Rx  . Dyslipidemia   . Chronic venous hypertension without complications   . Sarcoidosis     dx by eye exam  . Obesity   . Anemia   . Lung nodule     12mm RLL nodule on CT Chest  07/2009  . Irritable bowel syndrome   . Hepatitis, unspecified     hx of  . Depressive disorder, not elsewhere classified   . Colon polyps   . Anemia   . Hypertension   . Glucose intolerance (impaired glucose tolerance)     hgb AIC 6.1 09/02/2009  . Hx of hysterectomy 2002  . Ankle fracture, right     x 2  . Noncompliance   . PONV (postoperative nausea and vomiting)     Past Surgical History  Procedure Laterality Date  . Cholecystectomy    . Cardioversion      x 2  . Hernia repair  2009  . Abdominal hysterectomy    . Colonoscopy      age 78yo, [place]  . Cardioversion N/A 02/27/2013    Procedure: CARDIOVERSION;  Surgeon: Lewayne Bunting, MD;  Location: [facility] ENDOSCOPY;  Service: Cardiovascular;  Laterality: N/A;    Current Outpatient Prescriptions  Medication Sig Dispense Refill  . allopurinol (ZYLOPRIM) 100 MG tablet Begin 1 tablet  daily for 1 week, then 2 tablets daily  60 tablet  0  . ALPRAZolam (XANAX) 0.5 MG tablet 1-2 tablets at bedtime  30 tablet  1  . carvedilol (COREG) 6.25 MG tablet Take 1 tablet (6.25 mg total) by mouth 2 (two) times daily.  60 tablet  6  . cyanocobalamin 1000 MCG tablet Take 100 mcg by mouth daily.      . dabigatran (PRADAXA) 150 MG CAPS Take 1 capsule (150 mg total) by mouth every 12 (twelve) hours.  60 capsule  3  . furosemide (LASIX) 40 MG tablet Take 1 tablet (40 mg total) by mouth daily.  30 tablet  0  . ibuprofen (ADVIL,MOTRIN) 800 MG tablet Take 800 mg by mouth every 8 (eight) hours as needed for pain.      . methylPREDNISolone (MEDROL DOSEPAK) 4 MG tablet follow package directions  21 tablet  0  . potassium chloride SA (K-DUR,KLOR-CON) 20 MEQ tablet Take 1 tablet (20 mEq total) by mouth daily.  30 tablet  6  . lisinopril (PRINIVIL,ZESTRIL) 20 MG tablet Take 1 tablet (20 mg total) by mouth daily.  30 tablet  2  . [DISCONTINUED] propranolol (INDERAL) 60 MG tablet Take 60 mg by mouth daily.         No current facility-administered medications for this visit.    Allergies  Allergen  Reactions  . Albuterol     Palpitations, intolerance  . Atorvastatin     Body aches and pains--stiffness.   . Propoxyphene-Acetaminophen     Review of Systems negative except from HPI and PMH  Physical Exam BP 141/103  Pulse 83  Ht 5\' 4"  (1.626 m)  Wt 255 lb (115.667 kg)  BMI 43.75 kg/m2 Well developed and moderately  in no acute distress HENT normal E scleral and icterus clear Neck Supple JVP flat; carotids brisk and full Clear to ausculation irregularly irregular   rate and rhythm,  Soft with active bowel sounds No clubbing cyanosis  No  Edema Alert and oriented, grossly normal motor and sensory function Skin Warm and Dry  ECG demonstrates atrial fibrillation at 87 Intervals-//40 with a QTC of 476 Axis is leftward at -15  Assessment and  Plan

## 2013-03-23 MED ORDER — AMIODARONE HCL 200 MG PO TABS: ORAL_TABLET | ORAL | Status: DC

## 2013-03-23 NOTE — Telephone Encounter (Signed)
I spoke with pt and prescription will be sent in to [facility] for Amiodarone. Explained instructions to pt & tremendous cost savings. She will pick up medication and start today. Mylo Red RN

## 2013-03-23 NOTE — Telephone Encounter (Signed)
I spoke with [name] this morning about pricing information on Amiodarone. Left message for pt will call back about lower price & pharmacy. Mylo Red RN

## 2013-03-24 ENCOUNTER — Telehealth: Payer: Self-pay | Admitting: Internal Medicine

## 2013-03-24 ENCOUNTER — Other Ambulatory Visit: Payer: Self-pay | Admitting: Medical

## 2013-03-24 MED ORDER — COLCHICINE 0.6 MG PO TABS: 0.6000 mg | ORAL_TABLET | Freq: Every day | ORAL | Status: DC

## 2013-03-24 NOTE — Telephone Encounter (Signed)
Pt needs a refill on colchinin 0.6mg  to [facility] [place] [address]. Pt is having a gout flare up. Pt would like to be called when its sent to pharmacy

## 2013-03-24 NOTE — Telephone Encounter (Signed)
Patient is aware and she has an appointment for 04/05/13. CLS

## 2013-03-24 NOTE — Telephone Encounter (Signed)
dont know what you found but you two are heroes

## 2013-03-24 NOTE — Telephone Encounter (Signed)
Due for recheck on allopurinol/gout.  I did send refill on colchicine.

## 2013-04-05 ENCOUNTER — Ambulatory Visit (INDEPENDENT_AMBULATORY_CARE_PROVIDER_SITE_OTHER): Payer: BC Managed Care – PPO | Admitting: Internal Medicine

## 2013-04-05 ENCOUNTER — Ambulatory Visit (INDEPENDENT_AMBULATORY_CARE_PROVIDER_SITE_OTHER): Payer: BC Managed Care – PPO | Admitting: Medical

## 2013-04-05 ENCOUNTER — Telehealth: Payer: Self-pay | Admitting: Internal Medicine

## 2013-04-05 ENCOUNTER — Telehealth: Payer: Self-pay | Admitting: Family Medicine

## 2013-04-05 ENCOUNTER — Encounter: Payer: Self-pay | Admitting: Medical

## 2013-04-05 VITALS — BP 140/90 | HR 66 | Temp 97.8°F | Resp 16 | Wt 255.0 lb

## 2013-04-05 DIAGNOSIS — M109 Gout, unspecified: Secondary | ICD-10-CM

## 2013-04-05 DIAGNOSIS — I4891 Unspecified atrial fibrillation: Secondary | ICD-10-CM

## 2013-04-05 DIAGNOSIS — E790 Hyperuricemia without signs of inflammatory arthritis and tophaceous disease: Secondary | ICD-10-CM

## 2013-04-05 DIAGNOSIS — R7989 Other specified abnormal findings of blood chemistry: Secondary | ICD-10-CM

## 2013-04-05 DIAGNOSIS — Z79899 Other long term (current) drug therapy: Secondary | ICD-10-CM

## 2013-04-05 DIAGNOSIS — E119 Type 2 diabetes mellitus without complications: Secondary | ICD-10-CM

## 2013-04-05 DIAGNOSIS — D649 Anemia, unspecified: Secondary | ICD-10-CM

## 2013-04-05 LAB — CBC WITH DIFFERENTIAL/PLATELET
Basophils Relative: 0.5 % (ref 0.0–3.0)
Eosinophils Absolute: 0.1 10*3/uL (ref 0.0–0.7)
Eosinophils Relative: 1.7 % (ref 0.0–5.0)
HCT: 35.2 % — ABNORMAL LOW (ref 36.0–46.0)
Lymphs Abs: 2.3 10*3/uL (ref 0.7–4.0)
MCHC: 33.5 g/dL (ref 30.0–36.0)
MCV: 91.6 fl (ref 78.0–100.0)
Monocytes Absolute: 0.4 10*3/uL (ref 0.1–1.0)
Neutrophils Relative %: 48.8 % (ref 43.0–77.0)
RBC: 3.84 Mil/uL — ABNORMAL LOW (ref 3.87–5.11)
WBC: 5.5 10*3/uL (ref 4.5–10.5)

## 2013-04-05 LAB — BASIC METABOLIC PANEL
BUN: 31 mg/dL — ABNORMAL HIGH (ref 6–23)
CO2: 20 mEq/L (ref 19–32)
Calcium: 9 mg/dL (ref 8.4–10.5)
Chloride: 103 mEq/L (ref 96–112)
Creatinine, Ser: 1.2 mg/dL (ref 0.4–1.2)
GFR: 60.52 mL/min (ref >60.00)
Glucose, Bld: 292 mg/dL — ABNORMAL HIGH (ref 70–99)
Potassium: 4.2 mEq/L (ref 3.5–5.1)
Sodium: 138 mEq/L (ref 135–145)

## 2013-04-05 LAB — PROTIME-INR: INR: 1.2 ratio — ABNORMAL HIGH (ref 0.8–1.0)

## 2013-04-05 MED ORDER — ALLOPURINOL 300 MG PO TABS: 300.0000 mg | ORAL_TABLET | Freq: Every day | ORAL | Status: DC

## 2013-04-05 NOTE — Patient Instructions (Signed)
Begin Allopurinol 300mg  daily.  This is higher dose to help prevent gout flare ups.    Check glucose and write numbers down in the log book.   Check fasting in the morning, check before meals, check 2 hours after meals.   You don't have to check all the time, but for the next 2 weeks check at least morning fasting numbers and check before and after at least one other meal daily.   On some days check at bedtime.    i want to get a variety of blood sugar readings to see how your sugars are running.   Please get this to me in 2 weeks.   I recommend a healthy diet.    Do's:   whole grains such as whole grain pasta, rice, whole grains breads and whole grain cereals.  Use small quantities such as 1/2 cup per serving or 2 slices of bread per serving.    Eat 3-5 fruits daily  Eat beans at least once daily  Eat almonds in small quantities at least 3 days per week    If they eat meat, I recommend small portions of lean meats such as chicken, fish, and [place].  Eat as much NON corn and NON potato vegetables as they like, particularly raw or steamed  Drink several large glasses of water daily  Cautions:  Limit red meat  Limit corn and potatoes  Limit sweets, cake, pie, candy  Limit beer and alcohol  Avoid fried food, fast food, large portions  Avoid sugary drinks such as regular soda and sweet tea

## 2013-04-05 NOTE — Telephone Encounter (Signed)
New problem    Need clarification  On  Which office will be setting up sleep study.

## 2013-04-05 NOTE — Telephone Encounter (Signed)
I left a message for the nurse. CLS

## 2013-04-05 NOTE — Telephone Encounter (Signed)
I left a message with the nurse. CLS

## 2013-04-05 NOTE — Telephone Encounter (Signed)
Message copied by Janeice Robinson on Wed Apr 05, 2013  4:45 PM ------      Message from: Aleen Campi, DAVID S      Created: Wed Apr 05, 2013  2:26 PM       pls call her cardiology office and ask his nurse if they need [place] to setup home or sleep lab sleep study.  Reading over cardiology notes, I couldn't tell if they had done this recently or had order already for this. She is not currently on CPAP and hasn't had official diagnosis of sleep apnea.  ------

## 2013-04-05 NOTE — Progress Notes (Signed)
Subjective:  Stephanie [name] is a 62 y.o. female who presents for recheck.  Since last visit began allopurinol, currently on 200mg  daily and has seen improvement in gout.  The prednisone really helped for the acute flare.  Denies foot/toe pain today.   Diabetes - has tried to work on diet choices, is not currently taking diabetes medications.  Not checking glucose.    Went recently for cardioversion, but it was unsuccessful.  Has f/u next week with cardiology and another procedure is planned regarding her Afib and cardiomyopathy.  No other c/o.  The following portions of the patient's history were reviewed and updated as appropriate: allergies, current medications, past family history, past medical history, past social history, past surgical history and problem list.  ROS Otherwise as in subjective above  Objective: Physical Exam  Vital signs reviewed  General appearance: alert, no distress   Assessment: Encounter Diagnoses  Name Primary?  . Gout Yes  . Hyperuricemia   . Encounter for long-term (current) use of other medications   . Anemia   . Type II or unspecified type diabetes mellitus without mention of complication, not stated as uncontrolled    Plan: Gout - increase to Allopurinol 300mg  daily.  Colchicine only for prn use.  No current flare.  Improved clinically since beginning allopurinol.   Anemia - had labs done today through cardiology.  Will review results once they are in.  DM type II - begin checking glucose readings.   Gave glucometer today, discussed proper use, frequency of testing, and using the log book.   Advised routine glucose checks and bring these back in 2wk.  I would recommend we be more aggressive and start metformin to help control diabetes at this point, but she is reluctant to start medication given the numerous medications she is on currently and numerous health issues.  Advised nutrition consult but she declines.  discussed diet, foods to avoid,  and portion size.  i will await her glucose readings  Follow up: pending lab results.  She has cardiology f/u and additional procedures coming up next week.

## 2013-04-05 NOTE — Telephone Encounter (Signed)
Message copied by Janeice Robinson on Wed Apr 05, 2013  4:46 PM ------      Message from: Aleen Campi, DAVID S      Created: Wed Apr 05, 2013  2:42 PM       When you call cardiology regarding the sleep study, see if cardiologist is ok with her going back to classes, walking on campus at [employer].   ------

## 2013-04-05 NOTE — Telephone Encounter (Signed)
Office is closed at this time. Will discuss with dr Graciela Husbands and call tomorrow.

## 2013-04-07 NOTE — Telephone Encounter (Signed)
Left voicemail for Stephanie [name], according to dr Graciela Husbands we are not going to be doing a sleep study. They are clear to schedule if they feel one is needed.

## 2013-04-10 NOTE — Telephone Encounter (Signed)
The Nurse from Dr. Odessa Fleming office said that there is a mistake with the sleep study. She said Dr. Odessa Fleming plans is not to send her for a sleep study at all. CLS

## 2013-04-10 NOTE — Telephone Encounter (Signed)
pls ask about the other question (see below).  She wants me to write a letter saying she is clear to go back to school, meaning the ability to walk to and from classes across campus.    Is cardiology ok with this?

## 2013-04-11 MED ORDER — SODIUM CHLORIDE 0.9 % IV SOLN: INTRAVENOUS | Status: DC

## 2013-04-12 ENCOUNTER — Ambulatory Visit: Admit: 2013-04-12 | Payer: Self-pay | Admitting: Internal Medicine

## 2013-04-12 ENCOUNTER — Ambulatory Visit (HOSPITAL_COMMUNITY)
Admission: RE | Admit: 2013-04-12 | Discharge: 2013-04-12 | Disposition: A | Discharge status: Home / Self Care | Payer: BC Managed Care – PPO | Source: Ambulatory Visit | Attending: Internal Medicine | Admitting: Internal Medicine

## 2013-04-12 ENCOUNTER — Encounter (HOSPITAL_COMMUNITY)
Admission: RE | Disposition: A | Discharge status: Home / Self Care | Payer: Self-pay | Source: Ambulatory Visit | Attending: Internal Medicine

## 2013-04-12 DIAGNOSIS — I4891 Unspecified atrial fibrillation: Secondary | ICD-10-CM

## 2013-04-12 DIAGNOSIS — Z538 Procedure and treatment not carried out for other reasons: Secondary | ICD-10-CM | POA: Insufficient documentation

## 2013-04-12 SURGERY — CARDIOVERSION
Anesthesia: Monitor Anesthesia Care

## 2013-04-12 SURGERY — CANCELLED PROCEDURE

## 2013-04-12 NOTE — Progress Notes (Signed)
Patient in sinus rhythm on monitor,  Confirmed with a 12-lead ekg, and procedure was canceled per Dr. Graciela Husbands.

## 2013-04-17 ENCOUNTER — Other Ambulatory Visit: Payer: Self-pay | Admitting: Internal Medicine

## 2013-04-18 ENCOUNTER — Encounter: Payer: Self-pay | Admitting: Medical

## 2013-04-18 ENCOUNTER — Telehealth: Payer: Self-pay | Admitting: Internal Medicine

## 2013-04-18 ENCOUNTER — Encounter: Payer: Self-pay | Admitting: *Deleted

## 2013-04-18 NOTE — Telephone Encounter (Signed)
Dr. Graciela Husbands has given the okay for Stephanie [name] to go back to regular duties. She is picking up the letter in the morning and she wants to know can she get a letter from you as well. CLS

## 2013-04-18 NOTE — Telephone Encounter (Signed)
New problem  Pt is wanting to get a letter from Dr Graciela Husbands regarding returning back to school. Pt states she needs the letter by tomorrow. She said she has to be registered by July 7th. She said she asked her PCP but he said the ok would have to come from Dr Graciela Husbands due to her heart condition.

## 2013-04-18 NOTE — Telephone Encounter (Signed)
Discussed with dr Graciela Husbands, okay for pt to return to school with no restrictions. Spoke with pt, letter placed at the front desk for pick up

## 2013-04-18 NOTE — Telephone Encounter (Signed)
pls print letter for pt

## 2013-04-19 ENCOUNTER — Other Ambulatory Visit: Payer: Self-pay | Admitting: *Deleted

## 2013-04-19 MED ORDER — DABIGATRAN ETEXILATE MESYLATE 150 MG PO CAPS: 150.0000 mg | ORAL_CAPSULE | Freq: Two times a day (BID) | ORAL | Status: DC

## 2013-04-19 NOTE — Telephone Encounter (Signed)
Letter printer

## 2013-05-24 ENCOUNTER — Encounter: Payer: Self-pay | Admitting: *Deleted

## 2013-05-29 ENCOUNTER — Ambulatory Visit (INDEPENDENT_AMBULATORY_CARE_PROVIDER_SITE_OTHER): Payer: BC Managed Care – PPO | Admitting: Internal Medicine

## 2013-05-29 ENCOUNTER — Encounter: Payer: Self-pay | Admitting: Internal Medicine

## 2013-05-29 ENCOUNTER — Ambulatory Visit (HOSPITAL_COMMUNITY): Payer: BC Managed Care – PPO | Attending: Internal Medicine | Admitting: Radiology

## 2013-05-29 ENCOUNTER — Other Ambulatory Visit: Payer: Self-pay | Admitting: Medical

## 2013-05-29 VITALS — BP 126/81 | HR 90 | Ht 63.0 in | Wt 250.0 lb

## 2013-05-29 DIAGNOSIS — I428 Other cardiomyopathies: Secondary | ICD-10-CM | POA: Insufficient documentation

## 2013-05-29 DIAGNOSIS — I4891 Unspecified atrial fibrillation: Secondary | ICD-10-CM

## 2013-05-29 DIAGNOSIS — I10 Essential (primary) hypertension: Secondary | ICD-10-CM | POA: Insufficient documentation

## 2013-05-29 DIAGNOSIS — R5381 Other malaise: Secondary | ICD-10-CM | POA: Insufficient documentation

## 2013-05-29 DIAGNOSIS — E669 Obesity, unspecified: Secondary | ICD-10-CM | POA: Insufficient documentation

## 2013-05-29 DIAGNOSIS — I509 Heart failure, unspecified: Secondary | ICD-10-CM

## 2013-05-29 DIAGNOSIS — D869 Sarcoidosis, unspecified: Secondary | ICD-10-CM | POA: Insufficient documentation

## 2013-05-29 DIAGNOSIS — R0602 Shortness of breath: Secondary | ICD-10-CM | POA: Insufficient documentation

## 2013-05-29 DIAGNOSIS — E785 Hyperlipidemia, unspecified: Secondary | ICD-10-CM | POA: Insufficient documentation

## 2013-05-29 DIAGNOSIS — I5021 Acute systolic (congestive) heart failure: Secondary | ICD-10-CM

## 2013-05-29 LAB — BASIC METABOLIC PANEL
Calcium: 9.8 mg/dL (ref 8.4–10.5)
GFR: 47.98 mL/min — ABNORMAL LOW (ref >60.00)
Potassium: 3.1 mEq/L — ABNORMAL LOW (ref 3.5–5.1)
Sodium: 140 mEq/L (ref 135–145)

## 2013-05-29 MED ORDER — AMIODARONE HCL 200 MG PO TABS: 200.0000 mg | ORAL_TABLET | Freq: Every day | ORAL | Status: DC

## 2013-05-29 NOTE — Addendum Note (Signed)
Addended by: Aida Raider T on: 05/29/2013 01:43 PM   Modules accepted: Orders

## 2013-05-29 NOTE — Patient Instructions (Addendum)
Your physician recommends that you schedule a follow-up appointment in: 4 MONTHS WITH DR Graciela Husbands  Your physician has recommended that you have a sleep study. This test records several body functions during sleep, including: brain activity, eye movement, oxygen and carbon dioxide blood levels, heart rate and rhythm, breathing rate and rhythm, the flow of air through your mouth and nose, snoring, body muscle movements, and chest and belly movement.   TAKE AMIODARONE 200 MG ONCE DAILY  Your physician recommends that you HAVE LAB WORK TODAY

## 2013-05-29 NOTE — Progress Notes (Signed)
Limited Echocardiogram performed.  

## 2013-05-29 NOTE — Assessment & Plan Note (Signed)
The patient is holding sinus rhythm. Is not clear which medication she is taking regarding antiarrhythmics.  We will continue her on her amiodarone and dabigitran

## 2013-05-29 NOTE — Progress Notes (Signed)
Patient Care Team: Jac Canavan, PA-C as PCP - General (Family Medicine)   HPI  Stephanie [name] is a 62 y.o. female Seen in followup for atrial fibrillation in the context of morbid obesity,  sarcoid and mild left ventricular hypertrophy. Catheterization 2012 demonstrated nonobstructive coronary disease and normal left ventricular function and elevated filling pressures By echocardiogram, recurrent cardiomyopathy was demonstratedwith EF 25-30% April 2014  She underwent cardioversion may/14 with the hopes of restoring sinus rhythm and seeing recovery of LV systolic function. Echocardiogram is pending.  She also has complaints of sleep disturbance and chronic fatigue.      Past Medical History  Diagnosis Date  . Coronary artery disease     no CAD by cath 11.2010  . Diastolic CHF, chronic   . Atrial fibrillation     s/p DCCV 07/2009; Pradaxa Rx  . Dyslipidemia   . Chronic venous hypertension without complications   . Sarcoidosis     dx by eye exam  . Obesity   . Anemia   . Lung nodule     12mm RLL nodule on CT Chest  07/2009  . Irritable bowel syndrome   . Hepatitis, unspecified     hx of  . Depressive disorder, not elsewhere classified   . Colon polyps   . Anemia   . Hypertension   . Glucose intolerance (impaired glucose tolerance)     hgb AIC 6.1 09/02/2009  . Hx of hysterectomy 2002  . Ankle fracture, right     x 2  . Noncompliance   . PONV (postoperative nausea and vomiting)   . Antineoplastic and immunosuppressive drugs causing adverse effect in therapeutic use     Past Surgical History  Procedure Laterality Date  . Cholecystectomy    . Cardioversion      x 2  . Hernia repair  2009  . Abdominal hysterectomy    . Colonoscopy      age 54yo, [place]  . Cardioversion N/A 02/27/2013    Procedure: CARDIOVERSION;  Surgeon: Lewayne Bunting, MD;  Location: [facility] ENDOSCOPY;  Service: Cardiovascular;  Laterality: N/A;    Current Outpatient Prescriptions  Medication  Sig Dispense Refill  . allopurinol (ZYLOPRIM) 100 MG tablet Begin 1 tablet daily for 1 week, then 2 tablets daily  60 tablet  0  . allopurinol (ZYLOPRIM) 300 MG tablet Take 1 tablet (300 mg total) by mouth daily.  30 tablet  6  . ALPRAZolam (XANAX) 0.5 MG tablet 1-2 tablets at bedtime  30 tablet  1  . amiodarone (PACERONE) 200 MG tablet Take 2 tablets twice a day for 2 weeks. THEN decrease to 2 tablets daily for 2 weeks  90 tablet  2  . carvedilol (COREG) 6.25 MG tablet Take 1 tablet (6.25 mg total) by mouth 2 (two) times daily.  60 tablet  6  . colchicine 0.6 MG tablet Take 1 tablet (0.6 mg total) by mouth daily.  60 tablet  1  . cyanocobalamin 1000 MCG tablet Take 100 mcg by mouth daily.      . dabigatran (PRADAXA) 150 MG CAPS Take 1 capsule (150 mg total) by mouth every 12 (twelve) hours.  60 capsule  6  . furosemide (LASIX) 40 MG tablet Take 40 mg by mouth as needed.      Marland Kitchen lisinopril (PRINIVIL,ZESTRIL) 20 MG tablet Take 1 tablet (20 mg total) by mouth daily.  90 tablet  3  . potassium chloride SA (K-DUR,KLOR-CON) 20 MEQ tablet Take 1 tablet (20 mEq total)  by mouth daily.  90 tablet  3  . [DISCONTINUED] propranolol (INDERAL) 60 MG tablet Take 60 mg by mouth daily.         No current facility-administered medications for this visit.    Allergies  Allergen Reactions  . Albuterol     Palpitations, intolerance  . Atorvastatin     Body aches and pains--stiffness.   . Propoxyphene-Acetaminophen     Review of Systems negative except from HPI and PMH  Physical Exam BP 126/81  Pulse 90  Ht 5\' 3"  (1.6 m)  Wt 250 lb (113.399 kg)  BMI 44.3 kg/m2 Well developed and well nourished in no acute distress HENT normal E scleral and icterus clear Neck Supple JVP flat; carotids brisk and full Clear to ausculation  *Regular rate and rhythm, no murmurs gallops or rub Soft with active bowel sounds No clubbing cyanosis none Edema Alert and oriented, grossly normal motor and sensory  function Skin Warm and Dry    ECG demonstrates sinus rhythm at 85 Intervals 15/10/42  Assessment and  Plan

## 2013-05-29 NOTE — Assessment & Plan Note (Signed)
Reassessment of LV function following cardioversion is pending

## 2013-05-29 NOTE — Assessment & Plan Note (Signed)
I did discuss with her the potential value of a sleep study.

## 2013-05-30 ENCOUNTER — Other Ambulatory Visit: Payer: Self-pay | Admitting: *Deleted

## 2013-05-30 DIAGNOSIS — E876 Hypokalemia: Secondary | ICD-10-CM

## 2013-05-30 NOTE — Telephone Encounter (Signed)
Is this ok?

## 2013-05-30 NOTE — Telephone Encounter (Signed)
I called out Xanax per David Tysinger PA-C. CLS 

## 2013-05-30 NOTE — Telephone Encounter (Signed)
Call out Xanax 

## 2013-06-09 ENCOUNTER — Other Ambulatory Visit (INDEPENDENT_AMBULATORY_CARE_PROVIDER_SITE_OTHER): Payer: BC Managed Care – PPO

## 2013-06-09 DIAGNOSIS — E876 Hypokalemia: Secondary | ICD-10-CM

## 2013-06-09 LAB — BASIC METABOLIC PANEL
Chloride: 104 mEq/L (ref 96–112)
GFR: 61.08 mL/min (ref >60.00)
Potassium: 4.1 mEq/L (ref 3.5–5.1)
Sodium: 138 mEq/L (ref 135–145)

## 2013-06-19 DIAGNOSIS — G479 Sleep disorder, unspecified: Secondary | ICD-10-CM

## 2013-06-19 HISTORY — DX: Sleep disorder, unspecified: G47.9

## 2013-06-27 ENCOUNTER — Telehealth: Payer: Self-pay | Admitting: Internal Medicine

## 2013-06-27 NOTE — Telephone Encounter (Signed)
Rtn nurse call about tests results.

## 2013-06-29 ENCOUNTER — Ambulatory Visit (HOSPITAL_BASED_OUTPATIENT_CLINIC_OR_DEPARTMENT_OTHER): Payer: BC Managed Care – PPO | Attending: Internal Medicine | Admitting: Radiology

## 2013-06-29 VITALS — Ht 64.0 in | Wt 245.0 lb

## 2013-06-29 DIAGNOSIS — G4733 Obstructive sleep apnea (adult) (pediatric): Secondary | ICD-10-CM | POA: Insufficient documentation

## 2013-06-29 DIAGNOSIS — I4891 Unspecified atrial fibrillation: Secondary | ICD-10-CM

## 2013-06-30 ENCOUNTER — Telehealth: Payer: Self-pay | Admitting: *Deleted

## 2013-06-30 NOTE — Telephone Encounter (Signed)
Pt aware of results of lab

## 2013-06-30 NOTE — Telephone Encounter (Signed)
Message copied by Baird Lyons on Fri Jun 30, 2013 11:11 AM ------      Message from: Duke Salvia      Created: Mon Jun 12, 2013  4:34 PM       Please Inform Patient that previous abnormality is improved             Thanks       ------

## 2013-07-06 DIAGNOSIS — G4733 Obstructive sleep apnea (adult) (pediatric): Secondary | ICD-10-CM

## 2013-07-07 NOTE — Sleep Study (Signed)
[facility]  NAME: Stephanie [name] DATE OF BIRTH:  [birth year] MEDICAL RECORD NUMBER [id number]  LOCATION: [facility]  PHYSICIAN: Barbaraann Share  DATE OF STUDY: 06/29/2013  SLEEP STUDY TYPE: Nocturnal Polysomnogram               REFERRING PHYSICIAN: Duke Salvia, MD  INDICATION FOR STUDY: hypersomnia with sleep apnea  EPWORTH SLEEPINESS SCORE:   HEIGHT: 5\' 4"  (162.6 cm)  WEIGHT: 245 lb (111.131 kg)    Body mass index is 42.03 kg/(m^2).  NECK SIZE: 15 in.   SLEEP ARCHITECTURE:  The patient had a total sleep time of 216 minutes, with no slow wave sleep and only 59 minutes of REM.  Sleep onset latency was prolonged at 121 minutes, and REM onset was normal at 69 minutes.  Sleep efficiency was poor at 58%.  RESPIRATORY DATA:  The patient was noted to have 3 obstructive apneas and 24 obstructive hypopneas, giving her an apnea hypopnea index of 8 events per hour.  The events were not positional, but they were increased during REM.  Was mild snoring noted throughout.  The patient did not meet split-night protocol secondary to her small numbers of events.  OXYGEN DATA:  The patient had transient oxygen desaturation as low as 80% with her obstructive events.  CARDIAC DATA:  Occasional PAC and PVC noted, but no clinically significant cardiac arrhythmias were noted.  MOVEMENT/PARASOMNIA:  The patient had no significant leg jerks or other abnormal behaviors noted.  IMPRESSION/ RECOMMENDATION:   1) very mild obstructive sleep apnea/hypopnea syndrome, with an AHI of 8 events per hour and oxygen desaturation as low as 80%.  Treatment for this degree of sleep apnea can include a trial of weight loss alone, upper airway surgery, dental appliance, and also CPAP.  Clinical correlation is suggested.  The decision to treat this degree of sleep apnea aggressively should depend upon its impact to the patient's quality of life.  2) occasional PAC and PVC  noted, but no clinically significant cardiac arrhythmias were noted.  Barbaraann Share Diplomate, American Board of Sleep Medicine  ELECTRONICALLY SIGNED ON:  07/07/2013, 9:00 AM [facility] PH: [phone]   FX: [phone] ACCREDITED BY THE AMERICAN ACADEMY OF SLEEP MEDICINE

## 2013-08-04 ENCOUNTER — Ambulatory Visit (INDEPENDENT_AMBULATORY_CARE_PROVIDER_SITE_OTHER): Payer: BC Managed Care – PPO | Admitting: Medical

## 2013-08-04 ENCOUNTER — Encounter: Payer: Self-pay | Admitting: Medical

## 2013-08-04 ENCOUNTER — Telehealth: Payer: Self-pay | Admitting: Medical

## 2013-08-04 VITALS — BP 140/70 | HR 60 | Temp 97.5°F | Resp 18 | Wt 269.0 lb

## 2013-08-04 DIAGNOSIS — R4586 Emotional lability: Secondary | ICD-10-CM

## 2013-08-04 DIAGNOSIS — Z23 Encounter for immunization: Secondary | ICD-10-CM

## 2013-08-04 DIAGNOSIS — G4700 Insomnia, unspecified: Secondary | ICD-10-CM

## 2013-08-04 DIAGNOSIS — N644 Mastodynia: Secondary | ICD-10-CM

## 2013-08-04 LAB — CBC WITH DIFFERENTIAL/PLATELET
Basophils Absolute: 0 10*3/uL (ref 0.0–0.1)
HCT: 32.7 % — ABNORMAL LOW (ref 36.0–46.0)
Hemoglobin: 10.5 g/dL — ABNORMAL LOW (ref 12.0–15.0)
Lymphocytes Relative: 50 % — ABNORMAL HIGH (ref 12–46)
Lymphs Abs: 2.4 10*3/uL (ref 0.7–4.0)
MCV: 95.1 fL (ref 78.0–100.0)
Monocytes Absolute: 0.4 10*3/uL (ref 0.1–1.0)
Monocytes Relative: 8 % (ref 3–12)
Neutro Abs: 1.8 10*3/uL (ref 1.7–7.7)
RBC: 3.44 MIL/uL — ABNORMAL LOW (ref 3.87–5.11)
RDW: 14.8 % (ref 11.5–15.5)
WBC: 4.8 10*3/uL (ref 4.0–10.5)

## 2013-08-04 MED ORDER — ZOLPIDEM TARTRATE 10 MG PO TABS: 10.0000 mg | ORAL_TABLET | Freq: Every evening | ORAL | Status: DC | PRN

## 2013-08-04 NOTE — Telephone Encounter (Signed)
Schedule her for mammogram

## 2013-08-04 NOTE — Progress Notes (Signed)
Subjective:  Stephanie [name] is a 61 y.o. female who presents for multiple concerns.  1) she notes right breast soreness x 3 wk and feels hot.  Denies drainage.  Breast feels swollen.  Last mammogram years ago.  No hx/o breast cancer in self or family.    2) needs letter for dog companion.  She has had her dog, small Pomeranian x 4 years.  Her property manager advised she needs letter stating that is her companion/pet.  Needs this for mental comfort, barking to let her know of things going on in house, and provides emotional therapy for her.  She lives alone.    3) wants vaccine update.  4) has problems with insomnia.  Tries to sleep with tv nightly.  That is the only way she gets to sleep.   Xanax doesn't help her get to sleep, but does help her nerves.  Has used ambien in the past, and it worked well.  No other prior treatments.   No other aggravating or relieving factors.    No other c/o.  The following portions of the patient's history were reviewed and updated as appropriate: allergies, current medications, past family history, past medical history, past social history, past surgical history and problem list.  ROS Otherwise as in subjective above  Objective: Physical Exam  BP 140/70  Pulse 60  Temp(Src) 97.5 F (36.4 C) (Oral)  Resp 18  Wt 269 lb (122.018 kg)  BMI 46.15 kg/m2   General appearance: alert, no distress, WD/WN, obese [name] Breasts mild tenderness right lateral breast, large breasts in general, no obvious cellulitis, mass, fluctuance, or induration Oral cavity: MMM, no lesions Neck: supple, no lymphadenopathy, no thyromegaly, no masses Pulses: 2+ pulses throughout Ext: no edema Neuro: CN2-12 intact, nonfocal  Assessment: Encounter Diagnoses  Name Primary?  . Breast tenderness in female Yes  . Need for influenza vaccination   . Need for Tdap vaccination   . Emotional lability   . Insomnia      Plan: Breast tenderness - CBC, referral for  mammogram. Counseled on the influenza virus vaccine.  Vaccine information sheet given.  Influenza vaccine given after consent obtained. Counseled on the Tdap (tetanus, diptheria, and acellular pertussis) vaccine.  Vaccine information sheet given. Tdap vaccine given after consent obtained. Emotional lability -I wrote a letter on her behalf for her to keep her pet for companionship and emotional well being as well as for safety. Insomnia - discussed sleep hygeine, discussed the need to stop using tv to help her sleep.  Discussed medications, Ambien, risks/benefits, advised short term use as well as beginning better sleep hygiene. Follow up: pending studies

## 2013-08-04 NOTE — Patient Instructions (Signed)
Insomnia Insomnia is frequent trouble falling and/or staying asleep. Insomnia can be a long term problem or a short term problem. Both are common. Insomnia can be a short term problem when the wakefulness is related to a certain stress or worry. Long term insomnia is often related to ongoing stress during waking hours and/or poor sleeping habits. Overtime, sleep deprivation itself can make the problem worse. Every little thing feels more severe because you are overtired and your ability to cope is decreased.  CAUSES   Stress, anxiety, and depression.  Poor sleeping habits.  Distractions such as TV in the bedroom.  Naps close to bedtime.  Engaging in emotionally charged conversations before bed.  Technical reading before sleep.  Alcohol and other sedatives. They may make the problem worse. They can hurt normal sleep patterns and normal dream activity.  Stimulants such as caffeine for several hours prior to bedtime.  Pain syndromes and shortness of breath can cause insomnia.  Exercise late at night.  Changing time zones may cause sleeping problems (jet lag).  It is sometimes helpful to have someone observe your sleeping patterns. They should look for periods of not breathing during the night (sleep apnea). They should also look to see how long those periods last. If you live alone or observers are uncertain, you can also be observed at a sleep clinic where your sleep patterns will be professionally monitored. Sleep apnea requires a checkup and treatment. Give your caregivers your medical history. Give your caregivers observations your family has made about your sleep.   SYMPTOMS   Not feeling rested in the morning.  Anxiety and restlessness at bedtime.  Difficulty falling and staying asleep.  TREATMENT   Your caregiver may prescribe treatment for an underlying medical disorders. Your caregiver can give advice or help if you are using alcohol or other drugs for self-medication.  Treatment of underlying problems will usually eliminate insomnia problems.  Medications can be prescribed for short time use. They are generally not recommended for lengthy use.  Over-the-counter sleep medicines are not recommended for lengthy use. They can be habit forming.  You can promote easier sleeping by making lifestyle changes such as the following:  Sleep hygiene  Sleep only as much as you need to feel rested and then get out of bed  Keep a regular sleep schedule.  Aim to go to bed at the same time every night, and set an alarm clock to wake up at a fixed time each morning including weekends  Develop a bedtime ritual. Keep a familiar routine of bathing, brushing your teeth, climbing into bed at the same time each night, listening to soothing music. Routines increase the success of falling to sleep faster.  Use relaxation techniques. This can be using breathing and muscle tension release routines. It can also include visualizing peaceful scenes. You can also help control troubling or intruding thoughts by keeping your mind occupied with boring or repetitive thoughts like the old concept of counting sheep. You can make it more creative like imagining planting one beautiful flower after another in your backyard garden.  During your day, work to eliminate stress. When this is not possible use some of the previous suggestions to help reduce the anxiety that accompanies stressful situations.  Avoid forcing sleep  Exercise regularly at least 20 minutes, preferably 4-5 hours before bedtime  Avoid caffeinated beverages after lunch  Avoid alcohol near bedtime; no "night cap"  Avoid smoking, especially in the evening  Do not go to bed   hungry; work on changing your diet and the time of your last meal. No night time snacks.  Adjust bedroom environment to a cool, quiet, dark room  Deal with you worries before bedtime.  Consider counseling for excessive stress, worry, and life situations.   I can provide resources and contact information for counselors if needed.  Stimulus control  Go to bed only when sleepy  Do not watch television, read, eat, or worry while in bed.  Use bed only for sleep and sex  Stop tedious detailed work at least one hour before bedtime.  Get out of the bed if unable to fall asleep within 20 minutes and go to another room.  Return to bed only when sleepy.  Read or do some quiet activity. Keep the lights down. Wait until you feel sleepy and go back to bed.Repeat this step as many times as necessary throughout the night  Do not take a nap during the day   SLEEP DIARY   Keep a diary. Inform your caregiver about your progress. This includes any medication side effects. See your caregiver regularly. Take note of:  Times when you are asleep.  Times when you are awake during the night.  The quality of your sleep.  How you feel the next day.  This information will help your caregiver care for you.  Bring your sleep diary in at the next visit   

## 2013-08-07 NOTE — Telephone Encounter (Signed)
i WILL SCHEDULE THE PATIENT MAMMOGRAM. cls

## 2013-08-24 ENCOUNTER — Encounter: Payer: Self-pay | Admitting: Internal Medicine

## 2013-09-10 ENCOUNTER — Other Ambulatory Visit: Payer: Self-pay | Admitting: Medical

## 2013-09-11 NOTE — Telephone Encounter (Signed)
Is this okay to refill? 

## 2013-09-11 NOTE — Telephone Encounter (Signed)
Call out xanax, order in the system

## 2013-09-12 NOTE — Telephone Encounter (Signed)
Is this okay to refill? 

## 2013-09-12 NOTE — Telephone Encounter (Signed)
Yes, call it out

## 2013-09-18 ENCOUNTER — Other Ambulatory Visit: Payer: Self-pay | Admitting: Medical

## 2013-09-18 NOTE — Telephone Encounter (Signed)
How often is she using the xanax?  Does it make her sleepy?  If it helps her anxiety, we may just have her use this for anxiety and sleep.  I don't really like her on both Ambien and xanax.

## 2013-09-18 NOTE — Telephone Encounter (Signed)
IS THIS OKAY TO REFILL 

## 2013-09-19 ENCOUNTER — Telehealth: Payer: Self-pay | Admitting: Medical

## 2013-09-20 NOTE — Telephone Encounter (Signed)
Left message for pt to call me back 

## 2013-09-21 NOTE — Telephone Encounter (Signed)
Pt states she is using the xanax 1-2 a week due to family crisis and she get anxiety so that's when she will take it. It does not help her sleep. She has been out of [place] for about a month or more and she sleeps better with [place].

## 2013-09-22 NOTE — Telephone Encounter (Signed)
I called out Ambien to her pharmacy per David Tysinger PAC. CLS 

## 2013-09-22 NOTE — Telephone Encounter (Signed)
Call out the [facility] with 1 refill

## 2013-09-28 ENCOUNTER — Ambulatory Visit (INDEPENDENT_AMBULATORY_CARE_PROVIDER_SITE_OTHER): Payer: BC Managed Care – PPO | Admitting: Internal Medicine

## 2013-09-28 ENCOUNTER — Encounter: Payer: Self-pay | Admitting: Internal Medicine

## 2013-09-28 VITALS — BP 183/82 | HR 59 | Ht 63.0 in | Wt 267.0 lb

## 2013-09-28 DIAGNOSIS — I4891 Unspecified atrial fibrillation: Secondary | ICD-10-CM

## 2013-09-28 DIAGNOSIS — E669 Obesity, unspecified: Secondary | ICD-10-CM

## 2013-09-28 DIAGNOSIS — I429 Cardiomyopathy, unspecified: Secondary | ICD-10-CM

## 2013-09-28 MED ORDER — AMIODARONE HCL 200 MG PO TABS: 100.0000 mg | ORAL_TABLET | Freq: Every day | ORAL | Status: DC

## 2013-09-28 NOTE — Assessment & Plan Note (Signed)
Certainly contributing to exercise intolerance

## 2013-09-28 NOTE — Patient Instructions (Signed)
Your physician recommends that you return for lab work today: TSH, LFTs, ESR  Your physician has recommended you make the following change in your medication:  1) Decrease Amiodarone to 100 mg daily  We will call you in 3 weeks to follow up  Your physician recommends that you schedule a follow-up appointment in: 8-10 weeks with Dr. Graciela Husbands.  Your physician recommends that you schedule a follow-up appointment in: 3 months with Dr. Johney Frame for new eval A. Fib          (this appointment should be after you see Graciela Husbands in 8-10 weeks)

## 2013-09-28 NOTE — Progress Notes (Signed)
Patient Care Team: Jac Canavan, PA-C as PCP - General (Family Medicine)   HPI  Stephanie [name] is a 62 y.o. female Seen in followup for atrial fibrillation in the context of morbid obesity sarcoid and mild left ventricular hypertrophy. Catheterization 2012 demonstrated nonobstructive coronary disease and normal left ventricular function and elevated filling pressures By echocardiogram, recurrent cardiomyopathy was demonstratedwith EF 25-30% April 2014  She underwent cardioversion may/14 with the hopes of restoring sinus rhythm and seeing recovery of LV systolic function.  She had rapidly reverted to afib and then spontaneously reverted to sinus rhythm. There is interval near normalization of LV systolic function confirmed by echo August 2014  Sleep study was relatively unimpressive with an AHI of less than 10  She is considerably better and has been in sinus rhythm on amiodarone as best as we know since the summertime. However, since that time she has had significant night sweats. Amiodarone surveillance laboratories are pending.  Past Medical History  Diagnosis Date  . Coronary artery disease     no CAD by cath 11.2010  . Diastolic CHF, chronic   . Atrial fibrillation     s/p DCCV 07/2009; Pradaxa Rx  . Dyslipidemia   . Chronic venous hypertension without complications   . Sarcoidosis     dx by eye exam  . Obesity   . Anemia   . Lung nodule     12mm RLL nodule on CT Chest  07/2009  . Irritable bowel syndrome   . Hepatitis, unspecified     hx of  . Depressive disorder, not elsewhere classified   . Colon polyps   . Anemia   . Hypertension   . Glucose intolerance (impaired glucose tolerance)     hgb AIC 6.1 09/02/2009  . Hx of hysterectomy 2002  . Ankle fracture, right     x 2  . Noncompliance   . PONV (postoperative nausea and vomiting)   . Antineoplastic and immunosuppressive drugs causing adverse effect in therapeutic use     Past Surgical History    Procedure Laterality Date  . Cholecystectomy    . Cardioversion      x 2  . Hernia repair  2009  . Abdominal hysterectomy    . Colonoscopy      age 39yo, [place]  . Cardioversion N/A 02/27/2013    Procedure: CARDIOVERSION;  Surgeon: Lewayne Bunting, MD;  Location: [facility] ENDOSCOPY;  Service: Cardiovascular;  Laterality: N/A;    Current Outpatient Prescriptions  Medication Sig Dispense Refill  . allopurinol (ZYLOPRIM) 300 MG tablet Take 1 tablet (300 mg total) by mouth daily.  30 tablet  6  . ALPRAZolam (XANAX) 0.5 MG tablet TAKE ONE TO TWO TABLETS BY MOUTH AT BEDTIME  30 tablet  1  . amiodarone (PACERONE) 200 MG tablet Take 1 tablet (200 mg total) by mouth daily.  90 tablet  3  . carvedilol (COREG) 6.25 MG tablet Take 1 tablet (6.25 mg total) by mouth 2 (two) times daily.  60 tablet  6  . colchicine 0.6 MG tablet Take 1 tablet (0.6 mg total) by mouth daily.  60 tablet  1  . cyanocobalamin 1000 MCG tablet Take 100 mcg by mouth daily.      . dabigatran (PRADAXA) 150 MG CAPS Take 1 capsule (150 mg total) by mouth every 12 (twelve) hours.  60 capsule  6  . furosemide (LASIX) 40 MG tablet Take 40 mg by mouth as needed.      Marland Kitchen  lisinopril (PRINIVIL,ZESTRIL) 20 MG tablet Take 1 tablet (20 mg total) by mouth daily.  90 tablet  3  . potassium chloride SA (K-DUR,KLOR-CON) 20 MEQ tablet Take 1 tablet (20 mEq total) by mouth daily.  90 tablet  3  . zolpidem (AMBIEN) 10 MG tablet TAKE 1 TABLET BY MOUTH ONCE DAILY AT BEDTIME AS NEEDED FOR SLEEP  20 tablet  1  . [DISCONTINUED] propranolol (INDERAL) 60 MG tablet Take 60 mg by mouth daily.         No current facility-administered medications for this visit.    Allergies  Allergen Reactions  . Albuterol     Palpitations, intolerance  . Atorvastatin     Body aches and pains--stiffness.   . Propoxyphene-Acetaminophen     Review of Systems negative except from HPI and PMH  Physical Exam BP 183/82  Pulse 59  Ht 5\' 3"  (1.6 m)  Wt 267 lb (121.11  kg)  BMI 47.31 kg/m2 Well developed and obse in no acute distress HENT normal E scleral and icterus clear Neck Supple JVP flat; carotids brisk and full Clear to ausculation  Regular rate and rhythm, no murmurs gallops or rub Soft with active bowel sounds No clubbing cyanosis none Edema Alert and oriented, grossly normal motor and sensory function Skin Warm and Dry    Assessment and  Plan

## 2013-09-28 NOTE — Assessment & Plan Note (Signed)
There has been interval normalization. We'll continue her on ACE inhibitors and carvedilol

## 2013-09-28 NOTE — Assessment & Plan Note (Signed)
She is euvolemic. 

## 2013-09-28 NOTE — Assessment & Plan Note (Signed)
Holding sinus rhythm on amiodarone. I am concerned that her night sweats may be related to the drug either directly or secondary hyperthyroidism. We'll check surveillance laboratories and down titration of medication.

## 2013-09-29 LAB — TSH: TSH: 1.4 u[IU]/mL (ref 0.35–5.50)

## 2013-09-29 LAB — HEPATIC FUNCTION PANEL
AST: 19 U/L (ref 0–37)
Bilirubin, Direct: 0 mg/dL (ref 0.0–0.3)
Total Bilirubin: 0.4 mg/dL (ref 0.3–1.2)

## 2013-09-29 LAB — SEDIMENTATION RATE: Sed Rate: 74 mm/hr — ABNORMAL HIGH (ref 0–22)

## 2013-10-09 ENCOUNTER — Encounter: Payer: Self-pay | Admitting: Medical

## 2013-10-09 ENCOUNTER — Ambulatory Visit (INDEPENDENT_AMBULATORY_CARE_PROVIDER_SITE_OTHER): Payer: BC Managed Care – PPO | Admitting: Medical

## 2013-10-09 VITALS — BP 122/80 | HR 60 | Temp 98.1°F | Resp 18 | Wt 268.0 lb

## 2013-10-09 DIAGNOSIS — R700 Elevated erythrocyte sedimentation rate: Secondary | ICD-10-CM

## 2013-10-09 DIAGNOSIS — M109 Gout, unspecified: Secondary | ICD-10-CM

## 2013-10-09 DIAGNOSIS — E119 Type 2 diabetes mellitus without complications: Secondary | ICD-10-CM

## 2013-10-09 DIAGNOSIS — D649 Anemia, unspecified: Secondary | ICD-10-CM

## 2013-10-09 DIAGNOSIS — R61 Generalized hyperhidrosis: Secondary | ICD-10-CM

## 2013-10-09 DIAGNOSIS — R5381 Other malaise: Secondary | ICD-10-CM

## 2013-10-09 DIAGNOSIS — I519 Heart disease, unspecified: Secondary | ICD-10-CM

## 2013-10-09 LAB — IRON: Iron: 63 ug/dL (ref 42–145)

## 2013-10-09 LAB — CBC WITH DIFFERENTIAL/PLATELET
Basophils Absolute: 0 10*3/uL (ref 0.0–0.1)
Basophils Relative: 0 % (ref 0–1)
Eosinophils Absolute: 0.1 10*3/uL (ref 0.0–0.7)
Eosinophils Relative: 1 % (ref 0–5)
HCT: 33.8 % — ABNORMAL LOW (ref 36.0–46.0)
Hemoglobin: 10.8 g/dL — ABNORMAL LOW (ref 12.0–15.0)
Lymphocytes Relative: 36 % (ref 12–46)
MCH: 30.6 pg (ref 26.0–34.0)
MCHC: 32 g/dL (ref 30.0–36.0)
Monocytes Relative: 6 % (ref 3–12)
Neutro Abs: 3.5 10*3/uL (ref 1.7–7.7)
Neutrophils Relative %: 57 % (ref 43–77)
Platelets: 241 10*3/uL (ref 150–400)
RDW: 15.4 % (ref 11.5–15.5)

## 2013-10-09 LAB — HEMOGLOBIN A1C: Mean Plasma Glucose: 131 mg/dL — ABNORMAL HIGH (ref <117)

## 2013-10-09 LAB — IBC PANEL: UIBC: 259 ug/dL (ref 125–400)

## 2013-10-09 LAB — BASIC METABOLIC PANEL
CO2: 25 mEq/L (ref 19–32)
Chloride: 104 mEq/L (ref 96–112)
Creat: 1.08 mg/dL (ref 0.50–1.10)
Glucose, Bld: 114 mg/dL — ABNORMAL HIGH (ref 70–99)

## 2013-10-09 LAB — FERRITIN: Ferritin: 350 ng/mL — ABNORMAL HIGH (ref 10–291)

## 2013-10-09 NOTE — Progress Notes (Signed)
  Subjective:  Stephanie [name] is a 62 y.o. female who presents for follow up on abnormal lab.  She recently saw her cardiologist in followup, noted issues with sleeping difficulty, sweats, intermittent fevers, fatigue, and some labs were drawn including a sedimentation rate which was elevated.  These problems have been ongoing.  To further evaluate her symptoms, she was given a comprehensive review of systems questionnaire.  She reports numerous positive review of systems.  She specifically denies shoulder and hip weakness, shoulder and hip pain, no weight loss, no blood in the stool or urine. She denies headaches, temporal pulsations, leg swelling, headaches, joint swelling, finger pain, hand pain or wrist pain.    The following portions of the patient's history were reviewed and updated as appropriate: allergies, current medications, past family history, past medical history, past social history, past surgical history and problem list.  Review of Systems Constitutional: +fevers, -chills, +sweats, -unexpected weight change,+fatigue ENT: +runny nose, +nasal drainage, -ear pain, -sore throat Cardiology:  -chest pain, -palpitations, -edema Respiratory: +cough, +at times shortness of breath, -wheezing Gastroenterology: -abdominal pain, +nausea, -vomiting, +diarrhea, +constipation  Hematology: -bleeding or bruising problems Musculoskeletal: +knee pain bilat, great toe+ pain, right "gout", -myalgias, -joint swelling, -back pain Ophthalmology:+flashing lights, blurry vision, redness of eyes Urology: -dysuria, -difficulty urinating, -hematuria, -urinary frequency, -urgency Neurology: -headache, +generalized weakness, +dizziness, -tingling, -numbness Psych: +anxious, memory loss, depressed mood sometimes Gyn: +hot flashes, +vaginal dryness  Objective: Physical Exam  BP 122/80  Pulse 60  Temp(Src) 98.1 F (36.7 C) (Oral)  Resp 18  Wt 268 lb  (121.564 kg)   General appearance: alert, no distress, WD/WN HEENT: normocephalic, sclerae anicteric, conjunctiva pink and moist, TMs pearly, nares patent, no discharge or erythema, pharynx normal Oral cavity: MMM, no lesions Neck: supple, no lymphadenopathy, no thyromegaly, no masses, no JVD Heart: RRR, normal S1, S2, no murmurs Lungs: CTA bilaterally, no wheezes, rhonchi, or rales Abdomen: +bs, soft, non tender, non distended, no masses, no hepatomegaly, no splenomegaly Pulses: 2+ radial pulses, 2+ pedal pulses, normal cap refill Ext: no edema MSK: no obvious swelling or deformity, nontender in general  Assessment: Encounter Diagnoses  Name Primary?  . Elevated sed rate Yes  . Anemia   . Night sweats   . Gout   . Type II or unspecified type diabetes mellitus without mention of complication, not stated as uncontrolled   . Heart disease, unspecified   . Other malaise and fatigue      Plan: We discussed her numerous positive review of systems, symptoms, concerns. I advised that elevated sed rate can be for numerous conditions. Of note, 2 conditions that can raise sedimentation rate include anemia and heart failure, and she has a history of both.  There is no obvious symptoms or findings today suggestive of rheumatoid arthritis, polymyalgia rheumatica, giant cell arteritis, and recent labs include normal thyroid, normal liver panel. We will check some additional labs today. She will return the week after Christmas for PPD screening.  One consideration would be to use an antidepressant for postmenopausal symptoms.  Followup pending results.

## 2013-10-10 LAB — PATHOLOGIST SMEAR REVIEW

## 2013-10-10 LAB — ANA: Anti Nuclear Antibody(ANA): NEGATIVE

## 2013-10-10 NOTE — Telephone Encounter (Signed)
tsd  °

## 2013-10-26 ENCOUNTER — Telehealth: Payer: Self-pay | Admitting: *Deleted

## 2013-10-26 NOTE — Telephone Encounter (Signed)
Message copied by Stanton Kidney on Thu Oct 26, 2013  9:31 AM ------      Message from: Stanton Kidney      Created: Thu Sep 28, 2013  4:55 PM      Regarding: Call pt       09/28/13            Call pt in 3 weeks to see how she is doing ------

## 2013-10-26 NOTE — Telephone Encounter (Signed)
Called patient, per Dr. Olin Pia request at last office visit. Pt states she is still having night sweats. She was up all night last night, and just going to bed when I called her.  I explained that I would review this with Dr. [name] and get back with her with his recommendations. Pt agreeable to plan.

## 2013-10-27 ENCOUNTER — Ambulatory Visit (INDEPENDENT_AMBULATORY_CARE_PROVIDER_SITE_OTHER): Payer: No Typology Code available for payment source | Admitting: Medical

## 2013-10-27 ENCOUNTER — Encounter: Payer: Self-pay | Admitting: Medical

## 2013-10-27 VITALS — BP 130/88 | HR 62 | Temp 97.5°F | Resp 16 | Wt 268.0 lb

## 2013-10-27 DIAGNOSIS — R232 Flushing: Secondary | ICD-10-CM

## 2013-10-27 DIAGNOSIS — D649 Anemia, unspecified: Secondary | ICD-10-CM

## 2013-10-27 DIAGNOSIS — Z8709 Personal history of other diseases of the respiratory system: Secondary | ICD-10-CM

## 2013-10-27 DIAGNOSIS — N951 Menopausal and female climacteric states: Secondary | ICD-10-CM

## 2013-10-27 DIAGNOSIS — R7301 Impaired fasting glucose: Secondary | ICD-10-CM

## 2013-10-27 DIAGNOSIS — R0602 Shortness of breath: Secondary | ICD-10-CM

## 2013-10-27 DIAGNOSIS — R700 Elevated erythrocyte sedimentation rate: Secondary | ICD-10-CM

## 2013-10-27 DIAGNOSIS — G4700 Insomnia, unspecified: Secondary | ICD-10-CM

## 2013-10-27 DIAGNOSIS — R61 Generalized hyperhidrosis: Secondary | ICD-10-CM

## 2013-10-27 DIAGNOSIS — Z87898 Personal history of other specified conditions: Secondary | ICD-10-CM

## 2013-10-27 NOTE — Progress Notes (Addendum)
Subjective:  Stephanie [name] is a 63 y.o. female who presents for follow up on abnormal labs.    At last visit we discussed that she recently saw her cardiologist in follow up, noted issues with sleeping difficulty, sweats, intermittent fevers, fatigue, and some labs were drawn including a sedimentation rate which was elevated.  These problems have been ongoing.  To further evaluate her symptoms, she was given a comprehensive review of systems questionnaire.  She reports numerous positive review of systems.  She specifically denies shoulder and hip weakness, shoulder and hip pain, no weight loss, no blood in the stool or urine. She denies headaches, temporal pulsations, leg swelling, headaches, joint swelling, finger pain, hand pain or wrist pain.    She has a history of anemia for as long she can remember, last colonoscopy age 71 with some polyps, supposed to repeat colonoscopy in 2014. She denies any current bleeding. She denies history of blood transfusion. She is not currently taking iron, as in the past it was constipating her very bad.  He has a history of partial hysterectomy due to fibroids and bleeding.  The following portions of the patient's history were reviewed and updated as appropriate: allergies, current medications, past family history, past medical history, past social history, past surgical history and problem list.  Review of Systems Constitutional: +fevers, -chills, +sweats, -unexpected weight change,+fatigue ENT: +runny nose, +nasal drainage, -ear pain, -sore throat Cardiology:  -chest pain, -palpitations, -edema Respiratory: +cough, +at times shortness of breath, -wheezing Gastroenterology: -abdominal pain, +nausea, -vomiting, +diarrhea, +constipation  Hematology: -bleeding or bruising problems Musculoskeletal: +knee pain bilat, great toe+ pain, right "gout", -myalgias, -joint swelling, -back  pain Ophthalmology:+flashing lights, blurry vision, redness of eyes Urology: -dysuria, -difficulty urinating, -hematuria, -urinary frequency, -urgency Neurology: -headache, +generalized weakness, +dizziness, -tingling, -numbness Psych: +anxious, memory loss, depressed mood sometimes Gyn: +hot flashes, +vaginal dryness   Past Medical History  Diagnosis Date  . Coronary artery disease     no CAD by cath 11.2010  . Diastolic CHF, chronic   . Atrial fibrillation     s/p DCCV 07/2009; Pradaxa Rx  . Dyslipidemia   . Chronic venous hypertension without complications   . Sarcoidosis     dx by eye exam  . Obesity   . Anemia   . Lung nodule     37mm RLL nodule on CT Chest  07/2009  . Irritable bowel syndrome   . Hepatitis, unspecified     hx of  . Depressive disorder, not elsewhere classified   . Colon polyps   . Anemia   . Hypertension   . Glucose intolerance (impaired glucose tolerance)     hgb AIC 6.1 09/02/2009  . Hx of hysterectomy 2002  . Ankle fracture, right     x 2  . Noncompliance   . PONV (postoperative nausea and vomiting)   . Antineoplastic and immunosuppressive drugs causing adverse effect in therapeutic use     Objective: Physical Exam  BP 130/88  Pulse 62  Temp(Src) 97.5 F (36.4 C) (Oral)  Resp 16  Wt 268 lb (121.564 kg)   General appearance: alert, no distress, WD/WN Neck: supple, no lymphadenopathy, no thyromegaly, no masses, no JVD Heart: RRR, normal S1, S2, no murmurs Lungs: CTA bilaterally, no wheezes, rhonchi, or rales Pulses: 2+ radial pulses, 2+ pedal pulses, normal cap refill Ext: no edema    Assessment: Encounter Diagnoses  Name Primary?  . SOB (shortness of breath) Yes  . Night sweats   . Hot flashes   .  Insomnia   . Elevated sed rate   . History of solitary pulmonary nodule   . Impaired fasting blood sugar   . Anemia     Plan: Shortness of breath on occasion, night sweats - discussed possible causes. Discussed all recent  blood work we did. We will send her for chest x-ray given history of pulmonary nodule on prior CT, night sweats and complain of shortness of breath. She will return next week for PPD placement  Night sweats, hot flashes, insomnia-discussed her recent labs.  Impaired fasting glucose-discussed diet changes as needed, the fact that she is borderline diabetic  Anemia-not currently on iron, reviewed last colonoscopy notes, her anemia is long-standing.  Iron stores are fine.   This appears to be anemia of chronic disease given her TIBC and ferritin levels.   I will call her back on medication options now that we have discussed lab results and symptoms.  I will discuss her case with Dr. Redmond School supervising physician.

## 2013-10-30 ENCOUNTER — Telehealth: Payer: Self-pay | Admitting: Medical

## 2013-10-30 MED ORDER — FLUOXETINE HCL 20 MG PO TABS: 20.0000 mg | ORAL_TABLET | Freq: Every day | ORAL | Status: DC

## 2013-10-30 NOTE — Telephone Encounter (Signed)
After looking at options, lets have her begin Fluoxetine/Prozac daily in the morning.  Start 1/2 tablet daily the first week, then increase to 1 tablet daily.  Lets see if this can help hot flashes and sweats.   Lets see if how it does after a month.   There is some risk of medication interaction, but we can try something to help her symptoms.

## 2013-10-30 NOTE — Telephone Encounter (Signed)
LMOM TO CB. CLS 

## 2013-10-30 NOTE — Telephone Encounter (Signed)
Patient is aware of Shane Tysinger PAC recommendations. CLS 

## 2013-11-03 ENCOUNTER — Other Ambulatory Visit: Payer: Self-pay | Admitting: Internal Medicine

## 2013-11-03 ENCOUNTER — Other Ambulatory Visit: Payer: Self-pay | Admitting: Medical

## 2013-11-03 ENCOUNTER — Other Ambulatory Visit: Payer: Self-pay | Admitting: Nurse Practitioner

## 2013-11-07 ENCOUNTER — Ambulatory Visit
Admission: RE | Admit: 2013-11-07 | Discharge: 2013-11-07 | Disposition: A | Discharge status: Home / Self Care | Payer: No Typology Code available for payment source | Source: Ambulatory Visit | Attending: Medical | Admitting: Medical

## 2013-11-07 ENCOUNTER — Other Ambulatory Visit (INDEPENDENT_AMBULATORY_CARE_PROVIDER_SITE_OTHER): Payer: No Typology Code available for payment source

## 2013-11-07 DIAGNOSIS — Z111 Encounter for screening for respiratory tuberculosis: Secondary | ICD-10-CM

## 2013-11-07 DIAGNOSIS — R61 Generalized hyperhidrosis: Secondary | ICD-10-CM

## 2013-11-07 DIAGNOSIS — R0602 Shortness of breath: Secondary | ICD-10-CM

## 2013-11-16 ENCOUNTER — Encounter: Payer: Self-pay | Admitting: Family Medicine

## 2013-11-22 ENCOUNTER — Telehealth: Payer: Self-pay | Admitting: Internal Medicine

## 2013-11-22 NOTE — Telephone Encounter (Signed)
New Message  Pt called states that she is swelling up really bad// Unable to retrieve Lasix medication ( She states she is getting the run around) because she is not sure of who created the first order for this medication//please call back to assist. ( Tried to connect with refills, pt declined requests that this message goes directly to the nurse)

## 2013-11-23 ENCOUNTER — Other Ambulatory Visit: Payer: Self-pay | Admitting: *Deleted

## 2013-11-23 MED ORDER — FUROSEMIDE 40 MG PO TABS: 40.0000 mg | ORAL_TABLET | ORAL | Status: DC | PRN

## 2013-11-23 NOTE — Telephone Encounter (Signed)
Explained to pt that Chana Bode PA-C at her family doctor is the last person who prescribed/refilled her lasix. She also recently saw him in office for SOB issue she complains of. Referred to speak to their office first, advised to call back if they would like Dr. [name] to refill instead. Pt agreeable to plan.

## 2013-11-23 NOTE — Telephone Encounter (Signed)
Sent refill to [facility] per pt request (ok per Dr. [name])

## 2013-12-07 ENCOUNTER — Ambulatory Visit (INDEPENDENT_AMBULATORY_CARE_PROVIDER_SITE_OTHER): Payer: No Typology Code available for payment source | Admitting: Internal Medicine

## 2013-12-07 ENCOUNTER — Encounter: Payer: Self-pay | Admitting: Internal Medicine

## 2013-12-07 ENCOUNTER — Encounter (INDEPENDENT_AMBULATORY_CARE_PROVIDER_SITE_OTHER): Payer: Self-pay

## 2013-12-07 VITALS — BP 179/84 | HR 55 | Ht 63.0 in | Wt 273.0 lb

## 2013-12-07 DIAGNOSIS — R0602 Shortness of breath: Secondary | ICD-10-CM

## 2013-12-07 DIAGNOSIS — I4891 Unspecified atrial fibrillation: Secondary | ICD-10-CM

## 2013-12-07 DIAGNOSIS — I10 Essential (primary) hypertension: Secondary | ICD-10-CM

## 2013-12-07 MED ORDER — CLONIDINE HCL 0.1 MG PO TABS
0.2000 mg | ORAL_TABLET | Freq: Once | ORAL | Status: AC
  Administered 2013-12-07: 0.2 mg via ORAL

## 2013-12-07 NOTE — Progress Notes (Signed)
Patient Care Team: Carlena Hurl, PA-C as PCP - General (Family Medicine)   HPI  Stephanie [name] is a 63 y.o. female Seen in followup for atrial fibrillation in the context of morbid obesity sarcoid and mild left ventricular hypertrophy. Catheterization 2012 demonstrated nonobstructive coronary disease and normal left ventricular function and elevated filling pressures By echocardiogram, recurrent cardiomyopathy was demonstratedwith EF 25-30% April 2014  She underwent cardioversion may/14 with the hopes of restoring sinus rhythm and seeing recovery of LV systolic function. She had rapidly reverted to afib and then spontaneously reverted to sinus rhythm. There is interval near normalization of LV systolic function confirmed by echo August 2014   She comes in today with ongoing problems with nocturnal sweats. Her sedimentation rate was markedly elevated. This is being evaluated. She also complains of headaches. Apparently has been an issue of swelling although there is no evidence of swelling at this time.   .   Past Medical History  Diagnosis Date  . Coronary artery disease     no CAD by cath 11.2010  . Diastolic CHF, chronic   . Atrial fibrillation     s/p DCCV 07/2009; Pradaxa Rx  . Dyslipidemia   . Chronic venous hypertension without complications   . Sarcoidosis     dx by eye exam  . Obesity   . Anemia   . Lung nodule     40mm RLL nodule on CT Chest  07/2009  . Irritable bowel syndrome   . Hepatitis, unspecified     hx of  . Depressive disorder, not elsewhere classified   . Colon polyps   . Anemia   . Hypertension   . Glucose intolerance (impaired glucose tolerance)     hgb AIC 6.1 09/02/2009  . Hx of hysterectomy 2002  . Ankle fracture, right     x 2  . Noncompliance   . PONV (postoperative nausea and vomiting)   . Antineoplastic and immunosuppressive drugs causing adverse effect in therapeutic use     Past Surgical History  Procedure Laterality Date    . Cholecystectomy    . Cardioversion      x 2  . Hernia repair  2009  . Abdominal hysterectomy    . Colonoscopy      age 53yo, [place]  . Cardioversion N/A 02/27/2013    Procedure: CARDIOVERSION;  Surgeon: Lelon Perla, MD;  Location: [facility] ENDOSCOPY;  Service: Cardiovascular;  Laterality: N/A;    Current Outpatient Prescriptions  Medication Sig Dispense Refill  . allopurinol (ZYLOPRIM) 300 MG tablet Take 1 tablet (300 mg total) by mouth daily.  30 tablet  6  . ALPRAZolam (XANAX) 0.5 MG tablet TAKE ONE TO TWO TABLETS BY MOUTH AT BEDTIME  30 tablet  1  . amiodarone (PACERONE) 200 MG tablet Take 0.5 tablets (100 mg total) by mouth daily.  15 tablet  11  . carvedilol (COREG) 6.25 MG tablet TAKE ONE TABLET BY MOUTH TWICE DAILY. PT is only taking once.      . colchicine 0.6 MG tablet Take 1 tablet (0.6 mg total) by mouth daily.  60 tablet  1  . COLCRYS 0.6 MG tablet TAKE ONE TABLET BY MOUTH TWICE DAILY  30 tablet  5  . furosemide (LASIX) 40 MG tablet Take 1 tablet (40 mg total) by mouth as needed.  30 tablet  6  . lisinopril (PRINIVIL,ZESTRIL) 20 MG tablet Take 1 tablet (20 mg total) by mouth daily.  90 tablet  3  .  potassium chloride SA (K-DUR,KLOR-CON) 20 MEQ tablet Take 20 mEq by mouth every other day.      . zolpidem (AMBIEN) 10 MG tablet TAKE 1 TABLET BY MOUTH ONCE DAILY AT BEDTIME AS NEEDED FOR SLEEP  20 tablet  1  . cyanocobalamin 1000 MCG tablet Take 100 mcg by mouth daily.      . dabigatran (PRADAXA) 150 MG CAPS Take 1 capsule (150 mg total) by mouth every 12 (twelve) hours.  60 capsule  6  . FLUoxetine (PROZAC) 20 MG tablet Take 1 tablet (20 mg total) by mouth daily.  30 tablet  2  . [DISCONTINUED] propranolol (INDERAL) 60 MG tablet Take 60 mg by mouth daily.         No current facility-administered medications for this visit.    Allergies  Allergen Reactions  . Albuterol     Palpitations, intolerance  . Atorvastatin     Body aches and pains--stiffness.   . Propoxyphene  N-Acetaminophen     Review of Systems negative except from HPI and PMH  Physical Exam BP 225/84  Pulse 55  Ht 5\' 3"  (1.6 m)  Wt 273 lb (123.832 kg)  BMI 48.37 kg/m2 Well developed and well nourished in no acute distress HENT normal E scleral and icterus clear Neck Supple JVP flat; carotids brisk and full Clear to ausculation  Regular rate and rhythm, no murmurs gallops or rub Soft with active bowel sounds No clubbing cyanosis none Edema Alert and oriented, grossly normal motor and sensory function Skin Warm and Dry    Assessment and  Plan  Hypertensive urgency  Paroxysmal atrial fibrillation on amiodarone  Sarcoid  Obesity   We treated her BP with clonidine PO and gave her rX for clonidine po and amlodipine to make a decision based on cost   I have spoken to her regarding the importance of compliance with clonidine  We also refilled the lasix  She is to see her PCP next week regarding blood pressure followup

## 2013-12-07 NOTE — Patient Instructions (Addendum)
Your physician has recommended you make the following change in your medication:  1) Take Lasix 40 mg as needed

## 2013-12-19 ENCOUNTER — Other Ambulatory Visit: Payer: Self-pay | Admitting: Medical

## 2013-12-19 MED ORDER — CLONIDINE HCL 0.1 MG PO TABS
0.2000 mg | ORAL_TABLET | Freq: Once | ORAL | Status: AC
  Administered 2013-12-19: 0.2 mg via ORAL

## 2013-12-19 NOTE — Addendum Note (Signed)
Addended by: Cristopher Estimable on: 12/19/2013 09:26 AM   Modules accepted: Orders

## 2014-01-02 ENCOUNTER — Ambulatory Visit: Payer: BC Managed Care – PPO | Admitting: Internal Medicine

## 2014-01-08 ENCOUNTER — Telehealth: Payer: Self-pay | Admitting: Medical

## 2014-01-08 ENCOUNTER — Other Ambulatory Visit: Payer: Self-pay | Admitting: Family Medicine

## 2014-01-08 ENCOUNTER — Ambulatory Visit (INDEPENDENT_AMBULATORY_CARE_PROVIDER_SITE_OTHER): Payer: No Typology Code available for payment source | Admitting: Internal Medicine

## 2014-01-08 ENCOUNTER — Encounter: Payer: Self-pay | Admitting: Internal Medicine

## 2014-01-08 VITALS — BP 152/80 | HR 56 | Ht 63.0 in | Wt 272.0 lb

## 2014-01-08 DIAGNOSIS — I5032 Chronic diastolic (congestive) heart failure: Secondary | ICD-10-CM

## 2014-01-08 DIAGNOSIS — I4891 Unspecified atrial fibrillation: Secondary | ICD-10-CM

## 2014-01-08 DIAGNOSIS — I10 Essential (primary) hypertension: Secondary | ICD-10-CM

## 2014-01-08 DIAGNOSIS — F1010 Alcohol abuse, uncomplicated: Secondary | ICD-10-CM

## 2014-01-08 DIAGNOSIS — E669 Obesity, unspecified: Secondary | ICD-10-CM

## 2014-01-08 MED ORDER — AMIODARONE HCL 200 MG PO TABS: 200.0000 mg | ORAL_TABLET | Freq: Every day | ORAL | Status: DC

## 2014-01-08 MED ORDER — ALPRAZOLAM 0.5 MG PO TABS: 0.5000 mg | ORAL_TABLET | Freq: Every evening | ORAL | Status: DC | PRN

## 2014-01-08 NOTE — Telephone Encounter (Signed)
I called out Xanax to her pharmacy per Chana Bode PAC. CLS

## 2014-01-08 NOTE — Progress Notes (Signed)
appt made

## 2014-01-08 NOTE — Telephone Encounter (Signed)
Whatever mg dose I did last time.  I don't recall giving difference mg doses. shane

## 2014-01-08 NOTE — Patient Instructions (Signed)
Your physician recommends that you schedule a follow-up appointment as needed with Dr Rayann Heman and Dr [name] in 3 months    Your physician has recommended you make the following change in your medication:  1) Decrease Amiodarone to 200mg  daily 2) Make sure you re-start your Pradaxa 150mg  twice daily

## 2014-01-08 NOTE — Telephone Encounter (Signed)
Refill the xanax x 38mo no refill

## 2014-01-08 NOTE — Progress Notes (Signed)
Primary Care Physician: [name], PA-C Primary Cardiology/ EP Physician:  Dr [name]   Stephanie [name] is a 63 y.o. female with a h/o persistent afib, obesity, and hypertension who presents today for further evaluation of her afib.  She reports that she was diagnosed with atrial fibrillation several years ago after presenting to the hospital with pneumonia.  She was planned for cardioversion in 2012 but converted to sinus rhythm prior to the procedure.  Her EF was about 25% at that time.  She did not follow-up with Dr [name] for 2 years.  She did return 4/14 and was seen by Truitt [name].  She was back in afib at that time.  She was placed on pradaxa and coreg.  Repeat echo revealed normalization of her EF.  She underwent cardioversion by Dr [name] 5/14.  She subsequently has been placed on amiodarone by Dr [name].  She underwent sleep study 9/14 which revealed very mild sleep apnea.  Treatments for hypertension and and afib have been liminted by compliance issues.  She reports noncompliance with anticoagulation at this time.  She has cut back on her ETOH but has not been able to lose weight.  She is unaware of any further afib since her cardioversion 5/14.  She has not been very compliant with her medical therapy.  She continues to take amiodarone 400mg  daily though she admits that she was told that she should only take 200mg  daily.  She also admits that she has not been taking anticoagulation (pradaxa) as prescribed.  Today, she denies symptoms of palpitations, chest pain, shortness of breath, orthopnea, PND, lower extremity edema, dizziness, presyncope, or syncope.  She has occasional unsteadiness. The patient is tolerating medications without difficulties and is otherwise without complaint today.   Past Medical History  Diagnosis Date  . Coronary artery disease     no CAD by cath 11.2010  . Diastolic CHF, chronic   . Persistent atrial fibrillation     s/p DCCV 07/2009; Pradaxa Rx  .  Dyslipidemia   . Chronic venous hypertension without complications   . Sarcoidosis     dx by eye exam  . Obesity   . Anemia   . Lung nodule     31mm RLL nodule on CT Chest  07/2009  . Irritable bowel syndrome   . Hepatitis, unspecified     hx of  . Depressive disorder, not elsewhere classified   . Colon polyps   . Anemia   . Hypertension   . Glucose intolerance (impaired glucose tolerance)     hgb AIC 6.1 09/02/2009  . Hx of hysterectomy 2002  . Ankle fracture, right     x 2  . Noncompliance   . PONV (postoperative nausea and vomiting)   . Antineoplastic and immunosuppressive drugs causing adverse effect in therapeutic use   . Gout    Past Surgical History  Procedure Laterality Date  . Cholecystectomy    . Cardioversion      x 2  . Hernia repair  2009  . Abdominal hysterectomy    . Colonoscopy      age 68yo, [place]  . Cardioversion N/A 02/27/2013    Procedure: CARDIOVERSION;  Surgeon: Lelon Perla, MD;  Location: [facility] ENDOSCOPY;  Service: Cardiovascular;  Laterality: N/A;    Current Outpatient Prescriptions  Medication Sig Dispense Refill  . allopurinol (ZYLOPRIM) 300 MG tablet TAKE 1 TABLET BY MOUTH DAILY  30 tablet  0  . amiodarone (PACERONE) 400 MG tablet Take 400 mg  by mouth daily. Patient sometimes skips a day (01/08/14)      . amLODipine (NORVASC) 10 MG tablet Take 1 tablet by mouth daily.      . carvedilol (COREG) 6.25 MG tablet TAKE ONE TABLET BY MOUTH TWICE DAILY.      Marland Kitchen colchicine 0.6 MG tablet Take 1 tablet (0.6 mg total) by mouth daily.  60 tablet  1  . cyanocobalamin 1000 MCG tablet Take 1,000 mcg by mouth daily.       . furosemide (LASIX) 40 MG tablet Take 1 tablet (40 mg total) by mouth as needed.  30 tablet  6  . lisinopril (PRINIVIL,ZESTRIL) 20 MG tablet Take 1 tablet (20 mg total) by mouth daily.  90 tablet  3  . potassium chloride SA (K-DUR,KLOR-CON) 20 MEQ tablet Take 20 mEq by mouth every other day.      . zolpidem (AMBIEN) 10 MG tablet TAKE 1  TABLET BY MOUTH ONCE DAILY AT BEDTIME AS NEEDED FOR SLEEP  20 tablet  1  . [DISCONTINUED] propranolol (INDERAL) 60 MG tablet Take 60 mg by mouth daily.         No current facility-administered medications for this visit.    Allergies  Allergen Reactions  . Albuterol     Palpitations, intolerance  . Atorvastatin     Body aches and pains--stiffness.   . Propoxyphene N-Acetaminophen     History   Social History  . Marital Status: Single    Spouse Name: N/A    Number of Children: N/A  . Years of Education: N/A   Occupational History  . Not on file.   Social History Main Topics  . Smoking status: Never Smoker   . Smokeless tobacco: Not on file  . Alcohol Use: 0.0 oz/week     Comment: substantial alcohol intake. Sometimes 1 bottle of  wine / day.  She says she has reduced her alcohol recently.  . Drug Use: No  . Sexual Activity: Not Currently    Birth Control/ Protection: Surgical   Other Topics Concern  . Not on file   Social History Narrative   Pt lives in [place] with her son.  Unemployed          Family History  Problem Relation Age of Onset  . Pneumonia Father     deceased  . Cancer Mother     deceased  . Multiple sclerosis Mother     hx  . Emphysema Other     runs in the family  . Hypertension Father     ROS- All systems are reviewed and negative except as per the HPI above  Physical Exam: Filed Vitals:   01/08/14 0906  BP: 152/80  Pulse: 56  Height: 5\' 3"  (1.6 m)  Weight: 272 lb (123.378 kg)    GEN- The patient is morbidly obese appearing, alert and oriented x 3 today.   Head- normocephalic, atraumatic Eyes-  Sclera clear, conjunctiva pink Ears- hearing intact Oropharynx- clear Neck- supple, no JVP Lymph- no cervical lymphadenopathy Lungs- Clear to ausculation bilaterally, normal work of breathing Heart- Regular rate and rhythm, no murmurs, rubs or gallops, PMI not laterally displaced GI- soft, NT, ND, + BS Extremities- no clubbing,  cyanosis, or edema MS- no significant deformity or atrophy Skin- no rash or lesion Psych- euthymic mood, full affect Neuro- strength and sensation are intact  EKG today reveals sinus rhythm 56 bpm, LVH, nonspecific ST/T changes Echo is reviewed 20 minutes spent reviewing prior epic records today  Assessment and Plan:  1. Persistent afib The patient has symptomatic persistent afib.  She is noncompliant with anticoagulation.  Her CHADS2VASC score is at least 2.  I have encouraged compliance with pradaxa going forward (as prescribed by Dr [name]).  I have also advised that she reduce her amiodarone to 200mg  daily as he has previously instructed.  Eventually, she may be able to maintain sinus with amiodarone 100mg  daily.   Therapeutic strategies for afib including medicine and ablation were discussed in detail with the patient today. Risk, benefits, and alternatives to EP study and radiofrequency ablation for afib were also discussed in detail today.  She is very clear that she is not willing to consider ablation at this time.  I have therefore encouraged compliance and close follow-up with Dr [name]. The importance of weight reduction, blood pressure control, and ETOH avoidance were also discussed today.  She reports drinking very heavily in the past and I suspect that this was the leading driver of her afib.  Hopefully with avoidance her afib will be easier to control.  2. HTN Above Goal Weight reduction is advised Close followup with primary care and Dr [name] is encouraged  3. Obesity Weight reduction is essential for her health  4. ETOH Avoidance encouraged  I will see as needed going forward I have encouraged follow-up with Dr [name] in 3 months

## 2014-01-08 NOTE — Telephone Encounter (Signed)
I look back in her medication list in the history and she has been on .5mg , .25mg  and 1 mg. Which dose do you want me to call out?  CLS

## 2014-01-11 ENCOUNTER — Encounter: Payer: Self-pay | Admitting: Medical

## 2014-01-11 ENCOUNTER — Ambulatory Visit (INDEPENDENT_AMBULATORY_CARE_PROVIDER_SITE_OTHER): Payer: No Typology Code available for payment source | Admitting: Medical

## 2014-01-11 VITALS — BP 150/90 | HR 60 | Temp 98.2°F | Resp 19 | Wt 273.0 lb

## 2014-01-11 DIAGNOSIS — F1010 Alcohol abuse, uncomplicated: Secondary | ICD-10-CM

## 2014-01-11 DIAGNOSIS — F411 Generalized anxiety disorder: Secondary | ICD-10-CM

## 2014-01-11 DIAGNOSIS — G4700 Insomnia, unspecified: Secondary | ICD-10-CM

## 2014-01-11 DIAGNOSIS — I10 Essential (primary) hypertension: Secondary | ICD-10-CM

## 2014-01-11 DIAGNOSIS — I4891 Unspecified atrial fibrillation: Secondary | ICD-10-CM

## 2014-01-11 MED ORDER — ZOLPIDEM TARTRATE 10 MG PO TABS: 10.0000 mg | ORAL_TABLET | Freq: Every day | ORAL | Status: DC

## 2014-01-11 MED ORDER — ALPRAZOLAM 0.5 MG PO TABS: 0.5000 mg | ORAL_TABLET | Freq: Every evening | ORAL | Status: DC | PRN

## 2014-01-11 NOTE — Patient Instructions (Signed)
  Thank you for giving me the opportunity to serve you today.   Specific recommendations today include:  Use Ambien as needed for sleep  Use Xanax as needed for anxiety attack  Begin the Clonidine twice daily for blood pressure which may also help hot flashes  Follow up: in 1 month   I have included other useful information below for your review.  Current Outpatient Prescriptions on File Prior to Visit  Medication Sig Dispense Refill  . allopurinol (ZYLOPRIM) 300 MG tablet TAKE 1 TABLET BY MOUTH DAILY  30 tablet  0  . ALPRAZolam (XANAX) 0.5 MG tablet Take 1 tablet (0.5 mg total) by mouth at bedtime as needed for anxiety.  30 tablet  0  . amiodarone (PACERONE) 200 MG tablet Take 1 tablet (200 mg total) by mouth daily. Patient sometimes skips a day (01/08/14)  90 tablet  3  . amLODipine (NORVASC) 10 MG tablet Take 1 tablet by mouth daily.      . carvedilol (COREG) 6.25 MG tablet TAKE ONE TABLET BY MOUTH TWICE DAILY.      . furosemide (LASIX) 40 MG tablet Take 1 tablet (40 mg total) by mouth as needed.  30 tablet  6  . lisinopril (PRINIVIL,ZESTRIL) 20 MG tablet Take 1 tablet (20 mg total) by mouth daily.  90 tablet  3  . potassium chloride SA (K-DUR,KLOR-CON) 20 MEQ tablet Take 20 mEq by mouth every other day.      . colchicine 0.6 MG tablet Take 1 tablet (0.6 mg total) by mouth daily.  60 tablet  1  . cyanocobalamin 1000 MCG tablet Take 1,000 mcg by mouth daily.       Marland Kitchen zolpidem (AMBIEN) 10 MG tablet TAKE 1 TABLET BY MOUTH ONCE DAILY AT BEDTIME AS NEEDED FOR SLEEP  20 tablet  1  . [DISCONTINUED] propranolol (INDERAL) 60 MG tablet Take 60 mg by mouth daily.         No current facility-administered medications on file prior to visit.   Your blood pressure medicines include: Clonidine 0.2 mg twice daily (new) Lisinopril 20 mg once daily Norvasc 10 mg once daily Coreg 6.25 mg twice daily  Your heart medicine/Atrial fib medicine: Amiodarone 200 mg daily  Your medication for  swelling: Lasix 20mg  once daily Continue potassium daily  Your medication for gout: Allopurinol 300 mg once daily Colchicine 0.6 mg either once daily or twice daily for flare  Your sleep aid is: Ambien/zolpidem 10 mg as needed  Your medication for anxiety attack is: Xanax 0.5 mg as needed up to twice daily

## 2014-01-11 NOTE — Progress Notes (Signed)
   Subjective:   Stephanie [name] is a 63 y.o. female presenting on 01/11/2014 with Follow-up and Insomnia  Here at my request for recheck on sleep, anxiety, medications.  She does note have her medication bottles with her, and is not 100% sure which BP medications she is taking.   Insomnia - been out of Ambien since January.  It does help her get 3 solid hours of good sleep. She says a lot of her anxiety is due to lack of sleep.  Wakes up due to hot flashes.  She says most of these sleep issues stated when she began Amiodarone.    When discussing alcohol, she did get upset, feels like we (medical providers) are labeling her as an alcoholic. However, she reports drinking every 2 weeks, will drink 1-2 glasses.    Takes xanax for worse anxiety 2-3 times per week.    No other aggravating or relieving factors.  No other complaint.  Review of Systems ROS as in subjective      Objective:     Filed Vitals:   01/11/14 1354  BP: 150/90  Pulse: 60  Temp: 98.2 F (36.8 C)  Resp: 19    General appearance: alert, no distress, WD/WN Psych: pleasant, answers questions appropriately, good eye contact     Assessment: Encounter Diagnoses  Name Primary?  . Insomnia Yes  . A-fib   . Essential hypertension, benign   . Anxiety state, unspecified   . Excessive drinking alcohol      Plan: The bulk of today's visit was spent reviewing medicine reconciliation, trying to clarify what she is that she taking what she's not taking, counseling on sleep and anxiety, refilled medications today (ambien and xanax).  Advised she limit alcohol . Advised she begin Clonidine which she is already suppose to be taking.  She never started Fluoxetine.  answered her questions.  Gave detailed handout on her medications.  Compliance seems to be an issue.  F/u 67mo.  [name] was seen today for follow-up and insomnia.  Diagnoses and associated orders for this visit:  Insomnia  A-fib  Essential  hypertension, benign  Anxiety state, unspecified  Excessive drinking alcohol  Other Orders - zolpidem (AMBIEN) 10 MG tablet; Take 1 tablet (10 mg total) by mouth at bedtime. - ALPRAZolam (XANAX) 0.5 MG tablet; Take 1 tablet (0.5 mg total) by mouth at bedtime as needed for anxiety.   Return in about 1 month (around 02/11/2014).

## 2014-02-14 ENCOUNTER — Other Ambulatory Visit: Payer: Self-pay | Admitting: Medical

## 2014-04-06 ENCOUNTER — Other Ambulatory Visit: Payer: Self-pay | Admitting: Medical

## 2014-04-06 ENCOUNTER — Telehealth: Payer: Self-pay

## 2014-04-06 ENCOUNTER — Other Ambulatory Visit: Payer: Self-pay | Admitting: Internal Medicine

## 2014-04-06 NOTE — Telephone Encounter (Signed)
DONE

## 2014-04-06 NOTE — Telephone Encounter (Signed)
Refill request for Allopurinol 100mg  #60

## 2014-04-12 ENCOUNTER — Encounter: Payer: Self-pay | Admitting: Internal Medicine

## 2014-04-12 ENCOUNTER — Ambulatory Visit (INDEPENDENT_AMBULATORY_CARE_PROVIDER_SITE_OTHER): Payer: No Typology Code available for payment source | Admitting: Internal Medicine

## 2014-04-12 VITALS — BP 156/79 | HR 53 | Ht 64.0 in | Wt 271.0 lb

## 2014-04-12 DIAGNOSIS — I4819 Other persistent atrial fibrillation: Secondary | ICD-10-CM

## 2014-04-12 DIAGNOSIS — I5032 Chronic diastolic (congestive) heart failure: Secondary | ICD-10-CM

## 2014-04-12 DIAGNOSIS — I4891 Unspecified atrial fibrillation: Secondary | ICD-10-CM

## 2014-04-12 MED ORDER — DABIGATRAN ETEXILATE MESYLATE 150 MG PO CAPS: 150.0000 mg | ORAL_CAPSULE | Freq: Two times a day (BID) | ORAL | Status: DC

## 2014-04-12 MED ORDER — CLONIDINE HCL 0.2 MG PO TABS: 0.2000 mg | ORAL_TABLET | Freq: Two times a day (BID) | ORAL | Status: DC

## 2014-04-12 MED ORDER — AMIODARONE HCL 200 MG PO TABS: 200.0000 mg | ORAL_TABLET | Freq: Every day | ORAL | Status: DC

## 2014-04-12 NOTE — Progress Notes (Signed)
Patient Care Team: Carlena Hurl, PA-C as PCP - General (Family Medicine)   HPI  Stephanie [name] is a 63 y.o. female Seen in followup for atrial fibrillation in the context of morbid obesity sarcoid and mild left ventricular hypertrophy. Catheterization 2012 demonstrated nonobstructive coronary disease and normal left ventricular function and elevated filling pressures By echocardiogram, recurrent cardiomyopathy was demonstratedwith EF 25-30% April 2014  She underwent cardioversion may/14 with the hopes of restoring sinus rhythm and seeing recovery of LV systolic function. She had rapidly reverted to afib and then spontaneously reverted to sinus rhythm. There has been  interval near normalization of LV systolic function confirmed by echo August 2014   She saw Dr. Greggory Brandy 3/15 to consider catheter ablation.  She was not interested. He reduced her amiodarone to 200 mg a day. Encouraged her to refrain from alcohol and to work on blood pressure and weight reduction.  She says that she drinks a little bit of wine or alcohol a couple of weeks.  She notes that she is not exercising at all. She often doesn't get out of her apartment for 3 or 4 days at a time.  .   .   Past Medical History  Diagnosis Date  . Coronary artery disease     no CAD by cath 11.2010  . Diastolic CHF, chronic   . Persistent atrial fibrillation     s/p DCCV 07/2009; Pradaxa Rx  . Dyslipidemia   . Chronic venous hypertension without complications   . Sarcoidosis     dx by eye exam  . Obesity   . Anemia   . Lung nodule     46mm RLL nodule on CT Chest  07/2009  . Irritable bowel syndrome   . Hepatitis, unspecified     hx of  . Depressive disorder, not elsewhere classified   . Colon polyps   . Anemia   . Hypertension   . Glucose intolerance (impaired glucose tolerance)     hgb AIC 6.1 09/02/2009  . Hx of hysterectomy 2002  . Ankle fracture, right     x 2  . Noncompliance   . PONV (postoperative  nausea and vomiting)   . Antineoplastic and immunosuppressive drugs causing adverse effect in therapeutic use   . Gout     Past Surgical History  Procedure Laterality Date  . Cholecystectomy    . Cardioversion      x 2  . Hernia repair  2009  . Abdominal hysterectomy    . Colonoscopy      age 84yo, [place]  . Cardioversion N/A 02/27/2013    Procedure: CARDIOVERSION;  Surgeon: Lelon Perla, MD;  [facility] ENDOSCOPY;  Service: Cardiovascular;  Laterality: N/A;    Current Outpatient Prescriptions  Medication Sig Dispense Refill  . allopurinol (ZYLOPRIM) 100 MG tablet BEGIN WITH TAKE ONE TABLET BY MOUTH ONCE DAILY FOR 7 DAYS AND THEN TAKE TWO TABLETS BY MOUTH ONCE DAILY  60 tablet  0  . allopurinol (ZYLOPRIM) 300 MG tablet TAKE 1 TABLET BY MOUTH DAILY  30 tablet  0  . ALPRAZolam (XANAX) 0.5 MG tablet Take 1 tablet (0.5 mg total) by mouth at bedtime as needed for anxiety.  30 tablet  2  . amiodarone (PACERONE) 200 MG tablet Take 1 tablet (200 mg total) by mouth daily. Patient sometimes skips a day (01/08/14)  90 tablet  3  . amLODipine (NORVASC) 10 MG tablet Take 1 tablet by mouth daily.      Marland Kitchen  carvedilol (COREG) 6.25 MG tablet TAKE ONE TABLET BY MOUTH TWICE DAILY.      . carvedilol (COREG) 6.25 MG tablet TAKE ONE TABLET BY MOUTH TWICE DAILY  60 tablet  0  . cloNIDine (CATAPRES) 0.2 MG tablet Take 0.2 mg by mouth daily.      . colchicine 0.6 MG tablet Take 1 tablet (0.6 mg total) by mouth daily.  60 tablet  1  . cyanocobalamin 1000 MCG tablet Take 1,000 mcg by mouth daily.       Marland Kitchen FLUoxetine (PROZAC) 20 MG tablet Take 20 mg by mouth as needed.      . furosemide (LASIX) 40 MG tablet Take 1 tablet (40 mg total) by mouth as needed.  30 tablet  6  . lisinopril (PRINIVIL,ZESTRIL) 20 MG tablet Take 1 tablet (20 mg total) by mouth daily.  90 tablet  3  . potassium chloride SA (K-DUR,KLOR-CON) 20 MEQ tablet Take 20 mEq by mouth every other day.      Marland Kitchen PRADAXA 150 MG CAPS capsule Take 150 mg  by mouth daily.      Marland Kitchen zolpidem (AMBIEN) 10 MG tablet Take 1 tablet (10 mg total) by mouth at bedtime.  30 tablet  2  . [DISCONTINUED] propranolol (INDERAL) 60 MG tablet Take 60 mg by mouth daily.         No current facility-administered medications for this visit.    Allergies  Allergen Reactions  . Albuterol     Palpitations, intolerance  . Atorvastatin     Body aches and pains--stiffness.   . Propoxyphene N-Acetaminophen     Review of Systems negative except from HPI and PMH  Physical Exam BP 156/79  Pulse 53  Ht 5\' 4"  (1.626 m)  Wt 271 lb (122.925 kg)  BMI 46.49 kg/m2 Well developed and well nourished in no acute distress HENT normal E scleral and icterus clear Neck Supple JVP flat; carotids brisk and full Clear to ausculation  Regular rate and rhythm, no murmurs gallops or rub Soft with active bowel sounds No clubbing cyanosis none Edema Alert and oriented, grossly normal motor and sensory function Skin Warm and Dry    Assessment and  Plan  Hypertension  Paroxysmal atrial fibrillation on amiodarone  Sarcoid  Obesity    Blood pressure is much improved. We'll continue her on clonidine; she is taken once a day instead of twice a day. We have reviewed that with her. We'll stop her amlodipine.  We discussed the importance of exercise  I've also reminded her that Pradaxa is to be taken twice daily. She will continue her amiodarone.

## 2014-04-12 NOTE — Patient Instructions (Addendum)
Your physician wants you to follow-up in: 6 months with Dr Gari Crown will receive a reminder letter in the mail two months in advance. If you don't receive a letter, please call our office to schedule the follow-up appointment.   Your physician has recommended you make the following change in your medication:  1) Stop Amlodipine 2) Take your Clonidine twice daily 3) Take your Pradaxa twice daily

## 2014-04-13 ENCOUNTER — Ambulatory Visit: Payer: No Typology Code available for payment source | Admitting: Internal Medicine

## 2014-04-16 ENCOUNTER — Telehealth: Payer: Self-pay | Admitting: *Deleted

## 2014-04-16 NOTE — Telephone Encounter (Signed)
PA to [facility] for pts pradaxa

## 2014-04-17 NOTE — Telephone Encounter (Signed)
Pradaxa approved by [facility] dates 04/16/2014-04/16/2017

## 2014-04-17 NOTE — Telephone Encounter (Signed)
[facility] notified

## 2014-05-16 ENCOUNTER — Emergency Department (HOSPITAL_COMMUNITY): Payer: No Typology Code available for payment source

## 2014-05-16 ENCOUNTER — Encounter (HOSPITAL_COMMUNITY): Payer: No Typology Code available for payment source | Admitting: Certified Registered Nurse Anesthetist

## 2014-05-16 ENCOUNTER — Encounter (HOSPITAL_COMMUNITY): Payer: Self-pay | Admitting: Emergency Medicine

## 2014-05-16 ENCOUNTER — Ambulatory Visit: Admit: 2014-05-16 | Payer: Self-pay | Admitting: Gastroenterology

## 2014-05-16 ENCOUNTER — Emergency Department (HOSPITAL_COMMUNITY): Payer: No Typology Code available for payment source | Admitting: Certified Registered Nurse Anesthetist

## 2014-05-16 ENCOUNTER — Emergency Department (HOSPITAL_COMMUNITY)
Admission: EM | Admit: 2014-05-16 | Discharge: 2014-05-16 | Disposition: A | Discharge status: Home / Self Care | Payer: No Typology Code available for payment source | Attending: Emergency Medicine | Admitting: Emergency Medicine

## 2014-05-16 ENCOUNTER — Encounter (HOSPITAL_COMMUNITY)
Admission: EM | Disposition: A | Discharge status: Home / Self Care | Payer: No Typology Code available for payment source | Source: Home / Self Care | Attending: Emergency Medicine

## 2014-05-16 DIAGNOSIS — T18108A Unspecified foreign body in esophagus causing other injury, initial encounter: Secondary | ICD-10-CM | POA: Diagnosis present

## 2014-05-16 DIAGNOSIS — I10 Essential (primary) hypertension: Secondary | ICD-10-CM | POA: Diagnosis not present

## 2014-05-16 DIAGNOSIS — Z0389 Encounter for observation for other suspected diseases and conditions ruled out: Secondary | ICD-10-CM | POA: Diagnosis not present

## 2014-05-16 DIAGNOSIS — F411 Generalized anxiety disorder: Secondary | ICD-10-CM | POA: Diagnosis not present

## 2014-05-16 DIAGNOSIS — D869 Sarcoidosis, unspecified: Secondary | ICD-10-CM | POA: Diagnosis not present

## 2014-05-16 DIAGNOSIS — F329 Major depressive disorder, single episode, unspecified: Secondary | ICD-10-CM | POA: Insufficient documentation

## 2014-05-16 DIAGNOSIS — I2510 Atherosclerotic heart disease of native coronary artery without angina pectoris: Secondary | ICD-10-CM | POA: Diagnosis not present

## 2014-05-16 DIAGNOSIS — I4891 Unspecified atrial fibrillation: Secondary | ICD-10-CM | POA: Insufficient documentation

## 2014-05-16 DIAGNOSIS — F3289 Other specified depressive episodes: Secondary | ICD-10-CM | POA: Diagnosis not present

## 2014-05-16 DIAGNOSIS — R079 Chest pain, unspecified: Secondary | ICD-10-CM

## 2014-05-16 HISTORY — PX: ESOPHAGOGASTRODUODENOSCOPY: SHX5428

## 2014-05-16 LAB — CBC WITH DIFFERENTIAL/PLATELET
BASOS PCT: 0 % (ref 0–1)
BASOS PCT: 0 % (ref 0–1)
Basophils Absolute: 0 10*3/uL (ref 0.0–0.1)
Basophils Absolute: 0 10*3/uL (ref 0.0–0.1)
EOS ABS: 0 10*3/uL (ref 0.0–0.7)
EOS ABS: 0 10*3/uL (ref 0.0–0.7)
Eosinophils Relative: 0 % (ref 0–5)
Eosinophils Relative: 0 % (ref 0–5)
HCT: 35.4 % — ABNORMAL LOW (ref 36.0–46.0)
HCT: 35.3 % — ABNORMAL LOW (ref 36.0–46.0)
Hemoglobin: 11.7 g/dL — ABNORMAL LOW (ref 12.0–15.0)
Hemoglobin: 11.5 g/dL — ABNORMAL LOW (ref 12.0–15.0)
Lymphocytes Relative: 21 % (ref 12–46)
Lymphocytes Relative: 18 % (ref 12–46)
Lymphs Abs: 1.9 10*3/uL (ref 0.7–4.0)
Lymphs Abs: 1.8 10*3/uL (ref 0.7–4.0)
MCH: 30.8 pg (ref 26.0–34.0)
MCH: 30.5 pg (ref 26.0–34.0)
MCHC: 33.1 g/dL (ref 30.0–36.0)
MCHC: 32.6 g/dL (ref 30.0–36.0)
MCV: 93.2 fL (ref 78.0–100.0)
MCV: 93.6 fL (ref 78.0–100.0)
Monocytes Absolute: 0.5 10*3/uL (ref 0.1–1.0)
Monocytes Absolute: 0.7 10*3/uL (ref 0.1–1.0)
Monocytes Relative: 5 % (ref 3–12)
Monocytes Relative: 7 % (ref 3–12)
NEUTROS ABS: 6.8 10*3/uL (ref 1.7–7.7)
NEUTROS ABS: 7.8 10*3/uL — AB (ref 1.7–7.7)
NEUTROS PCT: 74 % (ref 43–77)
NEUTROS PCT: 75 % (ref 43–77)
PLATELETS: 227 10*3/uL (ref 150–400)
PLATELETS: 212 10*3/uL (ref 150–400)
RBC: 3.8 MIL/uL — ABNORMAL LOW (ref 3.87–5.11)
RBC: 3.77 MIL/uL — ABNORMAL LOW (ref 3.87–5.11)
RDW: 14.6 % (ref 11.5–15.5)
RDW: 14.7 % (ref 11.5–15.5)
WBC: 9.2 10*3/uL (ref 4.0–10.5)
WBC: 10.3 10*3/uL (ref 4.0–10.5)

## 2014-05-16 LAB — COMPREHENSIVE METABOLIC PANEL
ALT: 12 U/L (ref 0–35)
ANION GAP: 17 — AB (ref 5–15)
AST: 13 U/L (ref 0–37)
Albumin: 4.2 g/dL (ref 3.5–5.2)
Alkaline Phosphatase: 96 U/L (ref 39–117)
BUN: 14 mg/dL (ref 6–23)
CHLORIDE: 93 meq/L — AB (ref 96–112)
CO2: 24 mEq/L (ref 19–32)
Calcium: 10.1 mg/dL (ref 8.4–10.5)
Creatinine, Ser: 0.84 mg/dL (ref 0.50–1.10)
GFR calc Af Amer: 85 mL/min — ABNORMAL LOW (ref >90)
GFR calc non Af Amer: 74 mL/min — ABNORMAL LOW (ref >90)
Glucose, Bld: 171 mg/dL — ABNORMAL HIGH (ref 70–99)
Potassium: 3.5 mEq/L — ABNORMAL LOW (ref 3.7–5.3)
SODIUM: 134 meq/L — AB (ref 137–147)
TOTAL PROTEIN: 8.5 g/dL — AB (ref 6.0–8.3)
Total Bilirubin: 0.4 mg/dL (ref 0.3–1.2)

## 2014-05-16 LAB — D-DIMER, QUANTITATIVE (NOT AT ARMC): D-Dimer, Quant: 0.46 ug/mL-FEU (ref 0.00–0.48)

## 2014-05-16 LAB — I-STAT TROPONIN, ED: Troponin i, poc: 0.1 ng/mL (ref 0.00–0.08)

## 2014-05-16 LAB — TROPONIN I

## 2014-05-16 SURGERY — EGD (ESOPHAGOGASTRODUODENOSCOPY)
Anesthesia: General

## 2014-05-16 MED ORDER — GI COCKTAIL ~~LOC~~
30.0000 mL | Freq: Once | ORAL | Status: AC
  Administered 2014-05-16: 30 mL via ORAL
  Filled 2014-05-16: qty 30

## 2014-05-16 MED ORDER — GLUCAGON HCL RDNA (DIAGNOSTIC) 1 MG IJ SOLR
1.0000 mg | Freq: Once | INTRAMUSCULAR | Status: AC
  Administered 2014-05-16: 1 mg via INTRAVENOUS
  Filled 2014-05-16: qty 1

## 2014-05-16 MED ORDER — ONDANSETRON HCL 4 MG/2ML IJ SOLN
INTRAMUSCULAR | Status: AC
  Filled 2014-05-16: qty 2

## 2014-05-16 MED ORDER — PROMETHAZINE HCL 25 MG/ML IJ SOLN: 6.2500 mg | INTRAMUSCULAR | Status: DC | PRN

## 2014-05-16 MED ORDER — SODIUM CHLORIDE 0.9 % IV SOLN
INTRAVENOUS | Status: DC
  Administered 2014-05-16: 05:00:00 via INTRAVENOUS

## 2014-05-16 MED ORDER — LIDOCAINE HCL (CARDIAC) 20 MG/ML IV SOLN
INTRAVENOUS | Status: AC
  Filled 2014-05-16: qty 5

## 2014-05-16 MED ORDER — LACTATED RINGERS IV SOLN: INTRAVENOUS | Status: DC

## 2014-05-16 MED ORDER — PROPOFOL 10 MG/ML IV BOLUS
INTRAVENOUS | Status: AC
Start: 2014-05-16 — End: 2014-05-16
  Filled 2014-05-16: qty 20

## 2014-05-16 MED ORDER — MEPERIDINE HCL 50 MG/ML IJ SOLN: 6.2500 mg | INTRAMUSCULAR | Status: DC | PRN

## 2014-05-16 MED ORDER — PROPOFOL 10 MG/ML IV BOLUS
INTRAVENOUS | Status: DC | PRN
  Administered 2014-05-16: 300 mg via INTRAVENOUS

## 2014-05-16 MED ORDER — FENTANYL CITRATE 0.05 MG/ML IJ SOLN
25.0000 ug | INTRAMUSCULAR | Status: DC | PRN
  Administered 2014-05-16: 25 ug via INTRAVENOUS
  Filled 2014-05-16: qty 2

## 2014-05-16 MED ORDER — ONDANSETRON 4 MG PO TBDP
4.0000 mg | ORAL_TABLET | Freq: Once | ORAL | Status: AC
  Administered 2014-05-16: 4 mg via ORAL
  Filled 2014-05-16: qty 1

## 2014-05-16 MED ORDER — SUCCINYLCHOLINE CHLORIDE 20 MG/ML IJ SOLN
INTRAMUSCULAR | Status: DC | PRN
  Administered 2014-05-16: 140 mg via INTRAVENOUS

## 2014-05-16 MED ORDER — LACTATED RINGERS IV SOLN
INTRAVENOUS | Status: DC
  Administered 2014-05-16: 1000 mL via INTRAVENOUS

## 2014-05-16 MED ORDER — FENTANYL CITRATE 0.05 MG/ML IJ SOLN
50.0000 ug | Freq: Once | INTRAMUSCULAR | Status: AC
  Administered 2014-05-16: 50 ug via INTRAVENOUS
  Filled 2014-05-16: qty 2

## 2014-05-16 MED ORDER — LIDOCAINE HCL (CARDIAC) 20 MG/ML IV SOLN
INTRAVENOUS | Status: DC | PRN
  Administered 2014-05-16: 80 mg via INTRAVENOUS

## 2014-05-16 MED ORDER — PROPOFOL 10 MG/ML IV BOLUS
INTRAVENOUS | Status: AC
  Filled 2014-05-16: qty 20

## 2014-05-16 NOTE — ED Notes (Signed)
Brought in by EMS from home with c/o foreign body in throat.  Per EMS, pt reported that "a piece of bacon is stuck in her throat"--- has been lodged in her throat since 1500 yesterday; pt reports that she has not been able to take anything since including her due medications.  Pt presents to ED verbal, breathing even and quiet and in no s/s apparent distress.

## 2014-05-16 NOTE — Anesthesia Postprocedure Evaluation (Signed)
  Anesthesia Post-op Note  Patient: Stephanie [name]  Procedure(s) Performed: Procedure(s) (LRB): ESOPHAGOGASTRODUODENOSCOPY (EGD) (N/A)  Patient Location: PACU  Anesthesia Type: General  Level of Consciousness: awake and alert   Airway and Oxygen Therapy: Patient Spontanous Breathing  Post-op Pain: mild  Post-op Assessment: Post-op Vital signs reviewed, Patient's Cardiovascular Status Stable, Respiratory Function Stable, Patent Airway and No signs of Nausea or vomiting  Last Vitals:  Filed Vitals:   05/16/14 1410  BP: 188/87  Pulse: 69  Temp:   Resp: 19    Post-op Vital Signs: stable   Complications: No apparent anesthesia complications

## 2014-05-16 NOTE — Op Note (Signed)
[facility] [place], [address]   ENDOSCOPY PROCEDURE REPORT  PATIENT: Stephanie, [name]  MR#: [id number] BIRTHDATE: January 30, 1951 , 62  yrs. old GENDER: Female ENDOSCOPIST: Acquanetta Sit, MD REFERRED BY: PROCEDURE DATE:  05/16/2014 PROCEDURE:   EGD ASA CLASS: 3 INDICATIONS: suspected foreign body in esophgus MEDICATIONS: sedation per anesthesia TOPICAL ANESTHETIC:  DESCRIPTION OF PROCEDURE:   After the risks benefits and alternatives of the procedure were thoroughly explained, informed consent was obtained.  The Pentax       endoscope was introduced through the mouth and advanced to the 2nd portion of duodenum , limited by Without limitations.   The instrument was slowly withdrawn as the mucosa was fully examined.      FINDINGS:  Esophagus: No foreign body. Some focal eccymosis (brusing) in proximal esophgus (image 008 where the impaction probably was. No stricture or other abnormalities.  Stomach: normal  Duodenum: normal   COMPLICATIONS: none  ENDOSCOPIC IMPRESSION: see above   RECOMMENDATIONS: Discharge home. Soft foods.      _______________________________ Lorrin MaisAcquanetta Sit, MD 05/16/2014 12:56 PM       PATIENT NAME:  Stephanie, [name] MR#: [id number]

## 2014-05-16 NOTE — ED Notes (Signed)
I have received a call from Endo to wit:  [facility] Will be sending for her shortly, for her procedure with Dr. Penelope Coop for which they will obtain the permit.  At this time she has just went to [facility] To empty her bladder.

## 2014-05-16 NOTE — ED Notes (Signed)
She remains in no distress; and is continuing to spit out her saliva, stating she remains unable to swallow.

## 2014-05-16 NOTE — Discharge Instructions (Signed)
[facility] Care After  These instructions give you information on caring for yourself after your procedure. Your doctor may also give you more specific instructions. Call your doctor if you have any problems or questions after your procedure. HOME CARE  You may go back to your normal diet and activities when you feel able.  Drink enough fluids to keep your pee (urine) clear or pale yellow.  If you have a hard time pooping (constipation), ask your doctor if you should take a medicine to help you poop (laxative). GET HELP RIGHT AWAY IF:  You cannot pass gas.  You have a very hard time pooping.  You have belly (abdominal) pain that gets worse.  You have red, itchy spots (hives) on your skin.  Your throat gets puffy (swells).  You have trouble breathing.  You have a fever.  You have a hard time pooping for more than 3 days.  Your poop looks white or chalky after 3 days.  You have cramps, pain, or watery poop (diarrhea).  You feel sick to your stomach (nauseous), or you throw up (vomit). MAKE SURE YOU:  Understand these instructions.  Will watch your condition.  Will get help right away if you are not doing well or get worse. Document Released: 12/28/2011 Document Reviewed: 12/28/2011 [facility] Patient Information 2015 [place]. This information is not intended to replace advice given to you by your health care provider. Make sure you discuss any questions you have with your health care provider.

## 2014-05-16 NOTE — ED Notes (Signed)
She arouses ealsily from a comfortable sleep, and remains oriented x 4.

## 2014-05-16 NOTE — Transfer of Care (Signed)
Immediate Anesthesia Transfer of Care Note  Patient: Stephanie [name]  Procedure(s) Performed: Procedure(s): ESOPHAGOGASTRODUODENOSCOPY (EGD) (N/A)  Patient Location: PACU  Anesthesia Type:General  Level of Consciousness: awake and alert   Airway & Oxygen Therapy: Patient Spontanous Breathing and Patient connected to face mask oxygen  Post-op Assessment: Report given to PACU RN and Post -op Vital signs reviewed and stable  Post vital signs: Reviewed and stable  Complications: No apparent anesthesia complications

## 2014-05-16 NOTE — ED Notes (Addendum)
Pt was given water to drink--- took a small sip and able to swallow but then pt stated "No, I can't take it." pt refused further attempts to take sips of water.

## 2014-05-16 NOTE — ED Notes (Signed)
Bed: WA09 Expected date:  Expected time:  Means of arrival:  Comments: EMS foreign body in throat

## 2014-05-16 NOTE — Anesthesia Preprocedure Evaluation (Signed)
Anesthesia Evaluation  Patient identified by MRN, date of birth, ID band Patient awake    Reviewed: Allergy & Precautions, H&P , NPO status , Patient's Chart, lab work & pertinent test results  History of Anesthesia Complications (+) PONV  Airway Mallampati: II TM Distance: >3 FB Neck ROM: Full    Dental no notable dental hx.    Pulmonary  sarcoidosis breath sounds clear to auscultation  Pulmonary exam normal       Cardiovascular hypertension, - CAD + dysrhythmias Atrial Fibrillation Rhythm:Regular Rate:Normal     Neuro/Psych Anxiety Depression negative neurological ROS  negative psych ROS   GI/Hepatic negative GI ROS, Neg liver ROS,   Endo/Other  Morbid obesity  Renal/GU negative Renal ROS  negative genitourinary   Musculoskeletal negative musculoskeletal ROS (+)   Abdominal   Peds negative pediatric ROS (+)  Hematology negative hematology ROS (+)   Anesthesia Other Findings   Reproductive/Obstetrics negative OB ROS                           Anesthesia Physical Anesthesia Plan  ASA: II  Anesthesia Plan: General   Post-op Pain Management:    Induction: Intravenous  Airway Management Planned: Oral ETT  Additional Equipment:   Intra-op Plan:   Post-operative Plan: Extubation in OR  Informed Consent: I have reviewed the patients History and Physical, chart, labs and discussed the procedure including the risks, benefits and alternatives for the proposed anesthesia with the patient or authorized representative who has indicated his/her understanding and acceptance.   Dental advisory given  Plan Discussed with: CRNA  Anesthesia Plan Comments:         Anesthesia Quick Evaluation

## 2014-05-16 NOTE — ED Notes (Signed)
At this time she is taken to our O.R.

## 2014-05-16 NOTE — ED Notes (Signed)
She is drowsy and oriented x 4 with clear speech.  She states she still feels the food bolus ("I think it's bacon") at suprasternal notch area.  Her skin is normal, warm and dry and she is breathing normally.

## 2014-05-16 NOTE — ED Provider Notes (Addendum)
TIME SEEN: 3:15 AM  CHIEF COMPLAINT: "I feel like something is stuck in my throat"  HPI: Patient is a 63 year old female with history of A. fib on Pradaxa ( she reports she has not taken this medication in one month), hypertension, sarcoidosis, diastolic heart failure who presents to the emergency department with complaints of feeling like something is stuck in her throat and having chest pain. She states she feels like something is "growing" in her chest. She states that at 3 PM she ate one bite of a BLT and began to feel it she was choking on it. She states that she felt a "swirling" in her throat and chest and then began having pain afterwards. She started having vomiting about 9 PM tonight. She states that her chest pain as a pressure, throbbing and kept her from sleeping. She denies any short of breath. No diaphoresis or dizziness. She states it hurts to swallow her own saliva. No history of prior esophageal stricture or endoscopy.  ROS: See HPI Constitutional: no fever  Eyes: no drainage  ENT: no runny nose   Cardiovascular:  no chest pain  Resp: no SOB  GI: no vomiting GU: no dysuria Integumentary: no rash  Allergy: no hives  Musculoskeletal: no leg swelling  Neurological: no slurred speech ROS otherwise negative  PAST MEDICAL HISTORY/PAST SURGICAL HISTORY:  Past Medical History  Diagnosis Date  . Coronary artery disease     no CAD by cath 11.2010  . Diastolic CHF, chronic   . Persistent atrial fibrillation     s/p DCCV 07/2009; Pradaxa Rx  . Dyslipidemia   . Chronic venous hypertension without complications   . Sarcoidosis     dx by eye exam  . Obesity   . Anemia   . Lung nodule     74mm RLL nodule on CT Chest  07/2009  . Irritable bowel syndrome   . Hepatitis, unspecified     hx of  . Depressive disorder, not elsewhere classified   . Colon polyps   . Anemia   . Hypertension   . Glucose intolerance (impaired glucose tolerance)     hgb AIC 6.1 09/02/2009  . Hx of  hysterectomy 2002  . Ankle fracture, right     x 2  . Noncompliance   . PONV (postoperative nausea and vomiting)   . Antineoplastic and immunosuppressive drugs causing adverse effect in therapeutic use   . Gout     MEDICATIONS:  Prior to Admission medications   Medication Sig Start Date End Date Taking? Authorizing Provider  allopurinol (ZYLOPRIM) 100 MG tablet BEGIN WITH TAKE ONE TABLET BY MOUTH ONCE DAILY FOR 7 DAYS AND THEN TAKE TWO TABLETS BY MOUTH ONCE DAILY 04/06/14   Camelia Eng Tysinger, PA-C  allopurinol (ZYLOPRIM) 300 MG tablet TAKE 1 TABLET BY MOUTH DAILY 02/14/14   Camelia Eng Tysinger, PA-C  ALPRAZolam Duanne Moron) 0.5 MG tablet Take 1 tablet (0.5 mg total) by mouth at bedtime as needed for anxiety. 01/11/14   Camelia Eng Tysinger, PA-C  amiodarone (PACERONE) 200 MG tablet Take 1 tablet (200 mg total) by mouth daily. Patient sometimes skips a day (01/08/14) 04/12/14   Thompson Grayer, MD  carvedilol (COREG) 6.25 MG tablet TAKE ONE TABLET BY MOUTH TWICE DAILY. 11/03/13   Deboraha Sprang, MD  carvedilol (COREG) 6.25 MG tablet TAKE ONE TABLET BY MOUTH TWICE DAILY 04/06/14   Darlin Coco, MD  cloNIDine (CATAPRES) 0.2 MG tablet Take 1 tablet (0.2 mg total) by mouth 2 (two) times  daily. 04/12/14   Thompson Grayer, MD  colchicine 0.6 MG tablet Take 1 tablet (0.6 mg total) by mouth daily. 03/24/13   Camelia Eng Tysinger, PA-C  cyanocobalamin 1000 MCG tablet Take 1,000 mcg by mouth daily.     Historical Provider, MD  dabigatran (PRADAXA) 150 MG CAPS capsule Take 1 capsule (150 mg total) by mouth 2 (two) times daily. 04/12/14   Thompson Grayer, MD  FLUoxetine (PROZAC) 20 MG tablet Take 20 mg by mouth as needed. 01/05/14   Historical Provider, MD  furosemide (LASIX) 40 MG tablet Take 1 tablet (40 mg total) by mouth as needed. 11/23/13   Deboraha Sprang, MD  lisinopril (PRINIVIL,ZESTRIL) 20 MG tablet Take 1 tablet (20 mg total) by mouth daily. 03/22/13   Deboraha Sprang, MD  potassium chloride SA (K-DUR,KLOR-CON) 20 MEQ tablet Take 20  mEq by mouth every other day. 03/22/13   Deboraha Sprang, MD  zolpidem (AMBIEN) 10 MG tablet Take 1 tablet (10 mg total) by mouth at bedtime. 01/11/14   Carlena Hurl, PA-C    ALLERGIES:  Allergies  Allergen Reactions  . Albuterol     Palpitations, intolerance  . Atorvastatin     Body aches and pains--stiffness.   . Propoxyphene N-Acetaminophen     SOCIAL HISTORY:  History  Substance Use Topics  . Smoking status: Never Smoker   . Smokeless tobacco: Not on file  . Alcohol Use: 0.0 oz/week     Comment: drinks 1-2 glasses of wine every 2 weeks on average    FAMILY HISTORY: Family History  Problem Relation Age of Onset  . Pneumonia Father     deceased  . Cancer Mother     deceased  . Multiple sclerosis Mother     hx  . Emphysema Other     runs in the family  . Hypertension Father     EXAM: BP 182/87  Pulse 82  Temp(Src) 98.4 F (36.9 C) (Oral)  Resp 18  Ht 5\' 3"  (1.6 m)  Wt 280 lb (127.007 kg)  BMI 49.61 kg/m2  SpO2 97% CONSTITUTIONAL: Alert and oriented and responds appropriately to questions. Nontoxic appearing the patient does appear uncomfortable HEAD: Normocephalic EYES: Conjunctivae clear, PERRL ENT: normal nose; no rhinorrhea; moist mucous membranes; pharynx without lesions noted, no neck swelling NECK: Supple, no meningismus, no LAD  CARD: RRR; S1 and S2 appreciated; no murmurs, no clicks, no rubs, no gallops RESP: Normal chest excursion without splinting or tachypnea; breath sounds clear and equal bilaterally; no wheezes, no rhonchi, no rales, anterior chest wall is tender to palpation without crepitus or ecchymosis or deformity, no tracheal deviation ABD/GI: Normal bowel sounds; non-distended; soft, non-tender, no rebound, no guarding, no peritoneal signs BACK:  The back appears normal and is non-tender to palpation, there is no CVA tenderness EXT: Normal ROM in all joints; non-tender to palpation; no edema; normal capillary refill; no cyanosis    SKIN:  Normal color for age and race; warm; mildly diaphoretic NEURO: Moves all extremities equally PSYCH: The patient's mood and manner are appropriate. Grooming and personal hygiene are appropriate.  MEDICAL DECISION MAKING: Patient here with symptoms of chest pain and feeling a foreign body in her neck. Differential diagnosis includes esophageal foreign body versus food bolus, ACS, dissection, chest wall pain, GERD, esophageal rupture. Given she is a very poor historian and is very vague and in pain, history is limited. Will obtain labs including troponin and d-dimer. Will obtain x-rays of the soft tissues of  her neck and chest. Will give Zofran, GI cocktail and reassess. EKG does not show any new ischemic changes or arrhythmia.  ED PROGRESS: Patient's labs are unremarkable including a negative troponin, d-dimer. Given her symptoms started at 3 PM, I do not feel she needs a second troponin at this time given her initial troponin is negative. When I went to reassess the patient, she is now spitting up her secretions and when she is given a fluid challenge, she vomited it back up. I now suspect that she may have a esophageal food impaction. We'll give glucagon and pain medication and reassess.   5:20 AM  Pt has only minimal improvement after fentanyl and glucagon. She is still spitting up her secretions but has not vomited immediately after drinking fluids. Given she is still in significant pain and unable to swallow her secretions, will discuss with gastroenterology on call.   5:40 AM  Spoke with Dr. Cristina Gong with [facility] gastroenterology. He agrees patient likely needs an endoscopy she may have partial obstruction. Will try to get her on the schedule for endoscopy today. We'll keep her n.p.o. at this time and continue IV fluids and pain control as needed.  6:00 AM  Spoke with Dr. Cristina Gong again he states he will get the patient for endoscopy before noon today. He would also like to repeat CBC and chest x-ray  to ensure the patient is not developing a leukocytosis or pneumomediastinum that could be suggestive of a [place]er[place]ave's which they would not want to perform an endoscopy on. Patient's pain is better controlled after she was given IV fentanyl.  7:00 AM  Repeat CBC and chest x-ray show no significant change. No pneumomediastinum. She is awaiting endoscopy which will likely occur late morning today. She is stable.   EKG Interpretation  Date/Time:  Wednesday May 16 2014 03:30:47 EDT Ventricular Rate:  74 PR Interval:  244 QRS Duration: 112 QT Interval:  452 QTC Calculation: 501 R Axis:   -19 Text Interpretation:  Sinus rhythm Prolonged PR interval LVH with IVCD and secondary repol abnrm Prolonged QT interval T wave inversions in lateral leads have improved Otherwise no significant change Confirmed by Leela Vanbrocklin,  DO, Lucila Klecka [phone]) on 05/16/2014 3:33:34 AM        Delice Bison Leza Apsey, DO 05/16/14 0737   7:27 AM  Discussed again with Dr. Cristina Gong.  Pt may be early afternoon before endoscopy but will be done today.  Asked if pt should be admitted - he reports no, pt will likely be dc'ed from endoscopy suite/PACU.  Dr. Thurnell Garbe aware of pt.  [place], DO 05/16/14 [phone]

## 2014-05-16 NOTE — Progress Notes (Signed)
Dr. Penelope Coop in and talked with patient.

## 2014-05-16 NOTE — H&P (Signed)
63 year old female who was eating yesterday and got something stuck in he esophagus. She thinks that it is bacon. She came to ER and thinks it is still stuck.  PE: VSS  In no distress Heart RRR Lungs clear Abdomen soft and non tender.  IMP: Esophageal foreign body.  Plan EGD with FB removal.

## 2014-05-17 ENCOUNTER — Encounter (HOSPITAL_COMMUNITY): Payer: Self-pay | Admitting: Gastroenterology

## 2014-05-21 ENCOUNTER — Other Ambulatory Visit: Payer: Self-pay | Admitting: Medical

## 2014-05-21 NOTE — Telephone Encounter (Signed)
pls call out/reorder

## 2014-05-21 NOTE — Telephone Encounter (Signed)
Called out [place] and sent other med to pharmacy

## 2014-05-21 NOTE — Telephone Encounter (Signed)
Is this okay to refill these meds?

## 2014-05-22 ENCOUNTER — Other Ambulatory Visit: Payer: Self-pay

## 2014-05-22 MED ORDER — CARVEDILOL 6.25 MG PO TABS: 6.2500 mg | ORAL_TABLET | Freq: Two times a day (BID) | ORAL | Status: DC

## 2014-07-27 ENCOUNTER — Other Ambulatory Visit: Payer: Self-pay | Admitting: Medical

## 2014-07-30 NOTE — Telephone Encounter (Signed)
Is this okay to refill? 

## 2014-08-13 ENCOUNTER — Other Ambulatory Visit (INDEPENDENT_AMBULATORY_CARE_PROVIDER_SITE_OTHER): Payer: Medicare Other

## 2014-08-13 DIAGNOSIS — Z23 Encounter for immunization: Secondary | ICD-10-CM

## 2014-08-14 ENCOUNTER — Ambulatory Visit (INDEPENDENT_AMBULATORY_CARE_PROVIDER_SITE_OTHER): Payer: Medicare Other | Admitting: Medical

## 2014-08-14 ENCOUNTER — Encounter: Payer: Self-pay | Admitting: Medical

## 2014-08-14 VITALS — BP 122/80 | HR 56 | Temp 97.9°F | Resp 14 | Wt 268.0 lb

## 2014-08-14 DIAGNOSIS — F411 Generalized anxiety disorder: Secondary | ICD-10-CM | POA: Diagnosis not present

## 2014-08-14 DIAGNOSIS — R410 Disorientation, unspecified: Secondary | ICD-10-CM | POA: Diagnosis not present

## 2014-08-14 DIAGNOSIS — Z91199 Patient's noncompliance with other medical treatment and regimen due to unspecified reason: Secondary | ICD-10-CM

## 2014-08-14 DIAGNOSIS — R260 Ataxic gait: Secondary | ICD-10-CM | POA: Diagnosis not present

## 2014-08-14 DIAGNOSIS — G4700 Insomnia, unspecified: Secondary | ICD-10-CM | POA: Diagnosis not present

## 2014-08-14 DIAGNOSIS — Z9119 Patient's noncompliance with other medical treatment and regimen: Secondary | ICD-10-CM | POA: Diagnosis not present

## 2014-08-14 DIAGNOSIS — R32 Unspecified urinary incontinence: Secondary | ICD-10-CM | POA: Diagnosis not present

## 2014-08-14 DIAGNOSIS — R251 Tremor, unspecified: Secondary | ICD-10-CM | POA: Diagnosis not present

## 2014-08-14 DIAGNOSIS — R2689 Other abnormalities of gait and mobility: Secondary | ICD-10-CM | POA: Diagnosis not present

## 2014-08-14 DIAGNOSIS — R269 Unspecified abnormalities of gait and mobility: Secondary | ICD-10-CM | POA: Diagnosis not present

## 2014-08-14 LAB — COMPREHENSIVE METABOLIC PANEL WITH GFR
ALT: 9 U/L (ref 0–35)
AST: 11 U/L (ref 0–37)
Albumin: 4.1 g/dL (ref 3.5–5.2)
Alkaline Phosphatase: 81 U/L (ref 39–117)
BUN: 20 mg/dL (ref 6–23)
CO2: 26 meq/L (ref 19–32)
Calcium: 9.1 mg/dL (ref 8.4–10.5)
Chloride: 98 meq/L (ref 96–112)
Creat: 1.26 mg/dL — ABNORMAL HIGH (ref 0.50–1.10)
Glucose, Bld: 137 mg/dL — ABNORMAL HIGH (ref 70–99)
Potassium: 3.8 meq/L (ref 3.5–5.3)
Sodium: 137 meq/L (ref 135–145)
Total Bilirubin: 0.4 mg/dL (ref 0.2–1.2)
Total Protein: 7.5 g/dL (ref 6.0–8.3)

## 2014-08-14 LAB — POCT URINALYSIS DIPSTICK
BILIRUBIN UA: NEGATIVE
Blood, UA: NEGATIVE
Glucose, UA: NEGATIVE
KETONES UA: NEGATIVE
LEUKOCYTES UA: NEGATIVE
NITRITE UA: NEGATIVE
PH UA: 5
PROTEIN UA: NEGATIVE
Spec Grav, UA: 1.015
Urobilinogen, UA: NEGATIVE

## 2014-08-14 LAB — CBC
HCT: 35.7 % — ABNORMAL LOW (ref 36.0–46.0)
Hemoglobin: 11.4 g/dL — ABNORMAL LOW (ref 12.0–15.0)
MCH: 30 pg (ref 26.0–34.0)
MCHC: 31.9 g/dL (ref 30.0–36.0)
MCV: 93.9 fL (ref 78.0–100.0)
Platelets: 214 10*3/uL (ref 150–400)
RBC: 3.8 MIL/uL — ABNORMAL LOW (ref 3.87–5.11)
RDW: 14.9 % (ref 11.5–15.5)
WBC: 5 10*3/uL (ref 4.0–10.5)

## 2014-08-14 LAB — TSH: TSH: 5.052 u[IU]/mL — ABNORMAL HIGH (ref 0.350–4.500)
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
## 2014-08-15 ENCOUNTER — Other Ambulatory Visit: Payer: Self-pay | Admitting: Medical

## 2014-08-15 ENCOUNTER — Encounter: Payer: Self-pay | Admitting: Medical

## 2014-08-15 DIAGNOSIS — Z8619 Personal history of other infectious and parasitic diseases: Secondary | ICD-10-CM

## 2014-08-15 DIAGNOSIS — R7301 Impaired fasting glucose: Secondary | ICD-10-CM

## 2014-08-15 DIAGNOSIS — R7989 Other specified abnormal findings of blood chemistry: Secondary | ICD-10-CM

## 2014-08-15 DIAGNOSIS — I10 Essential (primary) hypertension: Secondary | ICD-10-CM

## 2014-08-15 DIAGNOSIS — E785 Hyperlipidemia, unspecified: Secondary | ICD-10-CM

## 2014-08-15 LAB — RPR

## 2014-08-15 MED ORDER — ALPRAZOLAM 0.5 MG PO TABS: 0.5000 mg | ORAL_TABLET | Freq: Every evening | ORAL | Status: DC | PRN

## 2014-08-15 NOTE — Addendum Note (Signed)
Addended by: Carlena Hurl on: 08/15/2014 05:10 PM   Modules accepted: Level of Service

## 2014-08-16 ENCOUNTER — Other Ambulatory Visit: Payer: Self-pay

## 2014-08-16 DIAGNOSIS — R251 Tremor, unspecified: Secondary | ICD-10-CM

## 2014-08-16 DIAGNOSIS — R2689 Other abnormalities of gait and mobility: Secondary | ICD-10-CM

## 2014-08-17 ENCOUNTER — Other Ambulatory Visit: Payer: Medicare Other

## 2014-08-17 DIAGNOSIS — Z8619 Personal history of other infectious and parasitic diseases: Secondary | ICD-10-CM

## 2014-08-17 DIAGNOSIS — R7989 Other specified abnormal findings of blood chemistry: Secondary | ICD-10-CM

## 2014-08-17 DIAGNOSIS — R7301 Impaired fasting glucose: Secondary | ICD-10-CM

## 2014-08-17 DIAGNOSIS — E785 Hyperlipidemia, unspecified: Secondary | ICD-10-CM

## 2014-08-17 DIAGNOSIS — I10 Essential (primary) hypertension: Secondary | ICD-10-CM | POA: Diagnosis not present

## 2014-08-17 DIAGNOSIS — R799 Abnormal finding of blood chemistry, unspecified: Secondary | ICD-10-CM | POA: Diagnosis not present

## 2014-08-17 LAB — LIPID PANEL
Cholesterol: 250 mg/dL — ABNORMAL HIGH (ref 0–200)
HDL: 73 mg/dL (ref >39)
LDL CALC: 156 mg/dL — AB (ref 0–99)
TRIGLYCERIDES: 104 mg/dL (ref <150)
Total CHOL/HDL Ratio: 3.4 Ratio
VLDL: 21 mg/dL (ref 0–40)

## 2014-08-17 LAB — HEMOGLOBIN A1C
Hgb A1c MFr Bld: 6.5 % — ABNORMAL HIGH (ref <5.7)
MEAN PLASMA GLUCOSE: 140 mg/dL — AB (ref <117)

## 2014-08-17 LAB — T3: T3, Total: 91.8 ng/dL (ref 80.0–204.0)

## 2014-08-17 LAB — T4, FREE: FREE T4: 1.5 ng/dL (ref 0.80–1.80)

## 2014-08-17 LAB — TSH: TSH: 2.42 u[IU]/mL (ref 0.350–4.500)

## 2014-08-18 LAB — HEPATITIS C ANTIBODY: HCV Ab: NEGATIVE

## 2014-08-18 LAB — HEPATITIS B CORE ANTIBODY, IGM: Hep B C IgM: NONREACTIVE

## 2014-08-18 LAB — HEPATITIS B SURFACE ANTIGEN: Hepatitis B Surface Ag: NEGATIVE

## 2014-08-21 ENCOUNTER — Other Ambulatory Visit: Payer: Self-pay | Admitting: Family Medicine

## 2014-08-21 ENCOUNTER — Other Ambulatory Visit: Payer: Self-pay | Admitting: Medical

## 2014-08-21 MED ORDER — ROSUVASTATIN CALCIUM 5 MG PO TABS: ORAL_TABLET | ORAL | Status: DC

## 2014-08-22 ENCOUNTER — Ambulatory Visit (INDEPENDENT_AMBULATORY_CARE_PROVIDER_SITE_OTHER): Payer: Medicare Other | Admitting: Neurology

## 2014-08-22 ENCOUNTER — Encounter: Payer: Self-pay | Admitting: Neurology

## 2014-08-22 VITALS — BP 168/77 | HR 68 | Temp 98.5°F | Resp 14 | Ht 63.0 in | Wt 274.0 lb

## 2014-08-22 DIAGNOSIS — R270 Ataxia, unspecified: Secondary | ICD-10-CM | POA: Diagnosis not present

## 2014-08-22 DIAGNOSIS — R55 Syncope and collapse: Secondary | ICD-10-CM

## 2014-08-22 DIAGNOSIS — R278 Other lack of coordination: Secondary | ICD-10-CM

## 2014-08-22 DIAGNOSIS — R269 Unspecified abnormalities of gait and mobility: Secondary | ICD-10-CM

## 2014-08-22 DIAGNOSIS — S060X9A Concussion with loss of consciousness of unspecified duration, initial encounter: Secondary | ICD-10-CM | POA: Insufficient documentation

## 2014-08-22 DIAGNOSIS — S060X2D Concussion with loss of consciousness of 31 minutes to 59 minutes, subsequent encounter: Secondary | ICD-10-CM

## 2014-08-22 MED ORDER — B COMPLEX-C PO TABS: 1.0000 | ORAL_TABLET | Freq: Every day | ORAL | Status: DC

## 2014-08-22 NOTE — Patient Instructions (Signed)
Fall Prevention and Home Safety °Falls cause injuries and can affect all age groups. It is possible to prevent falls.  °HOW TO PREVENT FALLS °· Wear shoes with rubber soles that do not have an opening for your toes. °· Keep the inside and outside of your house well lit. °· Use night lights throughout your home. °· Remove clutter from floors. °· Clean up floor spills. °· Remove throw rugs or fasten them to the floor with carpet tape. °· Do not place electrical cords across pathways. °· Put grab bars by your tub, shower, and toilet. Do not use towel bars as grab bars. °· Put handrails on both sides of the stairway. Fix loose handrails. °· Do not climb on stools or stepladders, if possible. °· Do not wax your floors. °· Repair uneven or unsafe sidewalks, walkways, or stairs. °· Keep items you use a lot within reach. °· Be aware of pets. °· Keep emergency numbers next to the telephone. °· Put smoke detectors in your home and near bedrooms. °Ask your doctor what other things you can do to prevent falls. °Document Released: 08/01/2009 Document Revised: 04/05/2012 Document Reviewed: 01/05/2012 °[employer]® Patient Information ©2015 [employer]. This information is not intended to replace advice given to you by your health care provider. Make sure you discuss any questions you have with your health care provider. ° °
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
## 2014-08-31 ENCOUNTER — Other Ambulatory Visit: Payer: Self-pay | Admitting: Medical

## 2014-09-03 ENCOUNTER — Ambulatory Visit (INDEPENDENT_AMBULATORY_CARE_PROVIDER_SITE_OTHER): Payer: Medicare Other

## 2014-09-03 DIAGNOSIS — R55 Syncope and collapse: Secondary | ICD-10-CM | POA: Diagnosis not present

## 2014-09-03 DIAGNOSIS — S060X2D Concussion with loss of consciousness of 31 minutes to 59 minutes, subsequent encounter: Secondary | ICD-10-CM

## 2014-09-03 DIAGNOSIS — R270 Ataxia, unspecified: Secondary | ICD-10-CM

## 2014-09-03 DIAGNOSIS — R278 Other lack of coordination: Secondary | ICD-10-CM

## 2014-09-03 DIAGNOSIS — R269 Unspecified abnormalities of gait and mobility: Secondary | ICD-10-CM

## 2014-09-04 ENCOUNTER — Telehealth: Payer: Self-pay | Admitting: Medical

## 2014-09-04 ENCOUNTER — Other Ambulatory Visit: Payer: Self-pay | Admitting: Medical

## 2014-09-04 MED ORDER — SIMVASTATIN 10 MG PO TABS: ORAL_TABLET | ORAL | Status: DC

## 2014-09-04 NOTE — Telephone Encounter (Signed)
I sent Simvastatin to pharmacy in place of Crestor since insurance doesn't cover Crestor.

## 2014-09-04 NOTE — Telephone Encounter (Signed)
Patient is aware of the medication change

## 2014-09-10 NOTE — Procedures (Signed)
[facility]  EEG (ELECTROENCEPHALOGRAM) REPORT   STUDY DATE:   11-0-16-15  PATIENT NAME: Stephanie [name]  MRN:  [id number]   ORDERING CLINICIAN: Larey Seat, MD   TECHNOLOGIST: Ronnald Ramp  TECHNIQUE: Electroencephalogram was recorded utilizing standard 10-20 system of lead placement and reformatted into average and bipolar montages.      RECORDING TIME:  32.8 minutes  ACTIVATION:  strobe lights and hyperventilation    CLINICAL INFORMATION:  The referring physician is Chana Bode PA, the patient is referred for gait ataxia spells of tremor and spells of mental confusion.     FINDINGS:  A posterior dominant EEG  Background rhythm of 9 hertz , alpha frequency, was noted.  As the patient became slightly drowsy there was focal slowing noted over C4 and T4 the right central and temporal regions. There was no correspondence slowing over the left temple. Once the patient was asleep sleep architecture again was symmetrically noted.  Photic stimulation was only noted at 9 Hz. There was extreme eye blinking in response to any of the frequencies of photic stimulation noted. Hyperventilation further documented focal slowing at the C4 and T4 region.  The EKG showed a normal sinus rhythm at 54 bpm..  IMPRESSION:  EEG with mild abnormality, slowing over right temporal region during HV.  This was not noted during sleep or photic stimulation.  The slowing can correlate with white matter changes or scar tissue at the named region, but did not produce epileptiform discharges.        Larey Seat , MD

## 2014-09-27 ENCOUNTER — Other Ambulatory Visit: Payer: Self-pay | Admitting: Medical

## 2014-09-28 ENCOUNTER — Ambulatory Visit (INDEPENDENT_AMBULATORY_CARE_PROVIDER_SITE_OTHER): Payer: Medicare Other | Admitting: Internal Medicine

## 2014-09-28 ENCOUNTER — Encounter: Payer: Self-pay | Admitting: Internal Medicine

## 2014-09-28 VITALS — BP 142/76 | HR 55 | Ht 63.0 in | Wt 275.0 lb

## 2014-09-28 DIAGNOSIS — I481 Persistent atrial fibrillation: Secondary | ICD-10-CM

## 2014-09-28 DIAGNOSIS — I4819 Other persistent atrial fibrillation: Secondary | ICD-10-CM

## 2014-09-28 MED ORDER — CARVEDILOL 3.125 MG PO TABS: 3.1250 mg | ORAL_TABLET | Freq: Two times a day (BID) | ORAL | Status: DC

## 2014-09-28 MED ORDER — FUROSEMIDE 40 MG PO TABS: 40.0000 mg | ORAL_TABLET | ORAL | Status: DC | PRN

## 2014-09-28 MED ORDER — AMIODARONE HCL 200 MG PO TABS: ORAL_TABLET | ORAL | Status: DC

## 2014-09-28 NOTE — Patient Instructions (Signed)
Your physician has recommended you make the following change in your medication:  1) RESUME Lisinopril 20 mg daily. 2) STOP Amlodipine 3) DECREASE Amiodarone to 200 mg five day per week 4) DECREASE Carvedilol to 3.125 mg twice a day   Your physician recommends that you schedule a follow-up appointment in: 3 months with Dr. [name].

## 2014-09-28 NOTE — Progress Notes (Signed)
Patient Care Team: Carlena Hurl, PA-C as PCP - General (Family Medicine)   HPI  Stephanie [name] is a 63 y.o. female Seen in followup for atrial fibrillation in the context of morbid obesity sarcoid and mild left ventricular hypertrophy. Catheterization 2012 demonstrated nonobstructive coronary disease and normal left ventricular function and elevated filling pressures By echocardiogram, recurrent cardiomyopathy was demonstratedwith EF 25-30% April 2014  She underwent cardioversion may/14 with the hopes of restoring sinus rhythm and seeing recovery of LV systolic function. She had rapidly reverted to afib and then spontaneously reverted to sinus rhythm. There has been  interval near normalization of LV systolic function confirmed by echo August 2014   She saw Dr. Greggory Brandy 3/15 to consider catheter ablation.  She was not interested. He reduced her amiodarone to 200 mg a day. Encouraged her to refrain from alcohol and to work on blood pressure and weight reduction.  She has been seeing neurology because of complaints of tremor and staggering.  Functional status is relatively stable. She has had no edema  She says that she drinks a little bit of wine or alcohol a couple of weeks.      .   Past Medical History  Diagnosis Date  . Coronary artery disease     no CAD by cath 11.2010  . Diastolic CHF, chronic   . Persistent atrial fibrillation     s/p DCCV 07/2009; Pradaxa Rx  . Dyslipidemia   . Chronic venous hypertension without complications   . Sarcoidosis     dx by eye exam  . Obesity   . Anemia   . Lung nodule     14mm RLL nodule on CT Chest  07/2009  . Irritable bowel syndrome   . Hepatitis, unspecified     hx of  . Depressive disorder, not elsewhere classified   . Colon polyps   . Anemia   . Hypertension   . Glucose intolerance (impaired glucose tolerance)     hgb AIC 6.1 09/02/2009  . Hx of hysterectomy 2002  . Ankle fracture, right     x 2  . Noncompliance     . PONV (postoperative nausea and vomiting)   . Antineoplastic and immunosuppressive drugs causing adverse effect in therapeutic use   . Gout   . Sleep disturbance 06/2013    sleep study, mild abnormality, no criteria for CPAP  . Diverticulosis of colon 2009    colonoscopy  . Colon polyps 2009    Past Surgical History  Procedure Laterality Date  . Cholecystectomy    . Cardioversion      x 2  . Hernia repair  2009  . Abdominal hysterectomy    . Colonoscopy      Diverticulosis, colon polyps, repeat 2014, Dr. Deatra Ina  . Cardioversion N/A 02/27/2013    Procedure: CARDIOVERSION;  Surgeon: Lelon Perla, MD;  Location: [facility]COPY;  Service: Cardiovascular;  Laterality: N/A;  . Esophagogastroduodenoscopy N/A 05/16/2014    Procedure: ESOPHAGOGASTRODUODENOSCOPY (EGD);  Surgeon: Wonda Horner, MD;  Location: [facility] ORS;  Service: Gastroenterology;  Laterality: N/A;    Current Outpatient Prescriptions  Medication Sig Dispense Refill  . allopurinol (ZYLOPRIM) 100 MG tablet BEGIN WITH TAKING ONE TABLET BY MOUTH ONCE A DAY FOR 7 DAYS AND THEN TAKE TWO TABLETS BY MOUTH ONCE A DAY (Patient taking differently: BEGIN WITH TAKING ONE TABLET BY MOUTH ONCE A DAY FOR 7 DAYS AND THEN TAKE TWO TABLETS BY MOUTH ONCE A DAY - PT  TAKING ONE TABLET DAILY) 60 tablet 0  . ALPRAZolam (XANAX) 0.5 MG tablet Take 1 tablet (0.5 mg total) by mouth at bedtime as needed for anxiety. 30 tablet 1  . amiodarone (PACERONE) 200 MG tablet Take 1 tablet (200 mg total) by mouth daily. Patient sometimes skips a day (01/08/14) 90 tablet 3  . amLODipine (NORVASC) 10 MG tablet Take 10 mg by mouth daily.     . B Complex-C (B-COMPLEX WITH VITAMIN C) tablet Take 1 tablet by mouth daily. 30 tablet 5  . carvedilol (COREG) 6.25 MG tablet Take 1 tablet (6.25 mg total) by mouth 2 (two) times daily with a meal. (Patient taking differently: Take 6.25 mg by mouth daily. ) 60 tablet 6  . cloNIDine (CATAPRES) 0.2 MG tablet Take 1 tablet (0.2 mg  total) by mouth 2 (two) times daily. (Patient taking differently: Take 0.2 mg by mouth daily. ) 60 tablet 11  . COLCRYS 0.6 MG tablet Take 0.6 mg by mouth as needed.     . cyanocobalamin 1000 MCG tablet Take 1,000 mcg by mouth daily.     . dabigatran (PRADAXA) 150 MG CAPS capsule Take 1 capsule (150 mg total) by mouth 2 (two) times daily. 60 capsule 11  . FLUoxetine (PROZAC) 20 MG tablet Take 20 mg by mouth daily.     . furosemide (LASIX) 40 MG tablet Take 1 tablet (40 mg total) by mouth as needed. (Patient taking differently: Take 40 mg by mouth as needed for fluid or edema. ) 30 tablet 6  . lisinopril (PRINIVIL,ZESTRIL) 20 MG tablet Take 1 tablet (20 mg total) by mouth daily. 90 tablet 3  . potassium chloride SA (K-DUR,KLOR-CON) 20 MEQ tablet Take 20 mEq by mouth every other day.    . simvastatin (ZOCOR) 10 MG tablet 1 tablet po QOD 30 tablet 2  . zolpidem (AMBIEN) 10 MG tablet TAKE ONE TABLET BY MOUTH AT BEDTIME (Patient taking differently: TAKE ONE TABLET BY MOUTH AT BEDTIME - PRN FOR SLEEP) 30 tablet 0  . [DISCONTINUED] propranolol (INDERAL) 60 MG tablet Take 60 mg by mouth daily.       No current facility-administered medications for this visit.    Allergies  Allergen Reactions  . Albuterol     Palpitations, intolerance  . Atorvastatin     Body aches and pains--stiffness.   . Propoxyphene N-Acetaminophen Nausea And Vomiting    Review of Systems negative except from HPI and PMH  Physical Exam BP 142/76 mmHg  Pulse 55  Ht 5\' 3"  (1.6 m)  Wt 275 lb (124.739 kg)  BMI 48.73 kg/m2 Well developed and well nourished in no acute distress HENT normal E scleral and icterus clear Neck Supple JVP flat; carotids brisk and full Clear to ausculation  Regular rate and rhythm, no murmurs gallops or rub Soft with active bowel sounds No clubbing cyanosis none Edema Alert and oriented, grossly normal motor and sensory function Skin Warm and Dry    Assessment and   Plan  Hypertension  Paroxysmal atrial fibrillation on amiodarone  Sarcoid  Obesity   Gait disturbance  Tremor  High risk medication surveillance  Blood pressure is much improved. We will discontinue her amlodipine, resume her lisinopril and have her take her carvedilol twice daily. We will do this at a reduced dose.  She may have evidence of amiodarone neurotoxicity as manifested by gait disturbance tremor. We will decrease the dose from 7-5 days a week. We will review her again in 3 months and if  there is no interval atrial fibrillation we will decrease it further. In the event that the symptoms do not abate we'll need to discontinue the medication. Surveillance laboratories were normal in October   Blood pressure is much improved. We'll continue her on clonidine; she is taken once a day instead of twice a day. We have reviewed that with her. We'll stop her amlodipine.  We discussed the importance of exercise  I've also reminded her that Pradaxa is to be taken twice daily.

## 2014-10-02 NOTE — Progress Notes (Signed)
Quick Note:  I called pt and relayed normal EEG, artefact of excessive eye blinking. She relayed that CD stated medication causing problem. Will continue with plan of Eval at PT on Friday. ______

## 2014-10-04 ENCOUNTER — Ambulatory Visit: Payer: Medicare Other | Attending: Neurology

## 2014-10-04 DIAGNOSIS — R269 Unspecified abnormalities of gait and mobility: Secondary | ICD-10-CM | POA: Insufficient documentation

## 2014-10-04 DIAGNOSIS — R279 Unspecified lack of coordination: Secondary | ICD-10-CM
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
## 2014-10-05 ENCOUNTER — Ambulatory Visit: Payer: Medicare Other

## 2014-10-17 ENCOUNTER — Ambulatory Visit: Payer: Medicare Other

## 2014-10-23 ENCOUNTER — Encounter: Payer: Self-pay | Admitting: Physical Therapy

## 2014-10-23 ENCOUNTER — Other Ambulatory Visit: Payer: Self-pay | Admitting: Medical

## 2014-10-23 ENCOUNTER — Ambulatory Visit: Payer: Medicare Other | Attending: Neurology | Admitting: Physical Therapy

## 2014-10-23 DIAGNOSIS — R269 Unspecified abnormalities of gait and mobility: Secondary | ICD-10-CM

## 2014-10-23 DIAGNOSIS — R279 Unspecified lack of coordination: Secondary | ICD-10-CM

## 2014-10-23 NOTE — Telephone Encounter (Signed)
Call out 20mo supply of xanax, make f/u appt.  Last visit was October with several concerns.

## 2014-10-23 NOTE — Telephone Encounter (Signed)
Is this okay to refill? 

## 2014-10-23 NOTE — Patient Instructions (Addendum)
Walking on Heels   At counter: Walk on heels forward and then backwards while continuing on a straight path. Repeat 3 laps each way. Do _1-2__ sessions per day.  Copyright  VHI. All rights reserved.  Walking on Toes   At counter: Walk on toes forward then backwards while continuing on a straight path. Do 3 laps each way. Do _1-2___ sessions per day.  Copyright  VHI. All rights reserved.  Feet Heel-Toe "Tandem"   At counter: Walk a straight line forward by bringing one foot directly in front of the other then backwards by bringing one foot directly behind the other one Repeat 3 laps each way. Do __1-2__ sessions per day.  Copyright  VHI. All rights reserved.  Figure Eight   Walk in a figure eight pattern. Repeat _2-4___ times per session. Do __1-2__ sessions per day.   Copyright  VHI. All rights reserved.  Feet Together (Compliant Surface) Varied Arm Positions - Eyes Closed   In corner: Stand on compliant surface: _pillows_with feet together and arms at sides:  1. Close eyes and visualize upright position. Hold__15__ seconds. Repeat _3___ times per session.  2. With eyes closed move head side to side x 10 times. 3. With eyes closes move head up and down x 10 times. Do _1-2___ sessions per day.   Copyright  VHI. All rights reserved.

## 2014-10-23 NOTE — Therapy (Signed)
[facility] [address] [place], [place], [address] Phone: [phone]   Fax:  [phone]  Physical Therapy Treatment  Patient Details  Name: Stephanie [name] MRN: [id number] Date of Birth: 1951/01/02  Encounter Date: 10/23/2014      PT End of Session - 10/23/14 1052    Visit Number 2   Number of Visits 17   Date for PT Re-Evaluation 11/30/14   Authorization Type Medicare G code every 10th visit   PT Start Time 1018   PT Stop Time 1056   PT Time Calculation (min) 38 min   Equipment Utilized During Treatment Gait belt   Activity Tolerance Patient tolerated treatment well   Behavior During Therapy [facility] for tasks assessed/performed      Past Medical History  Diagnosis Date  . Coronary artery disease     no CAD by cath 11.2010  . Diastolic CHF, chronic   . Persistent atrial fibrillation     s/p DCCV 07/2009; Pradaxa Rx  . Dyslipidemia   . Chronic venous hypertension without complications   . Sarcoidosis     dx by eye exam  . Obesity   . Anemia   . Lung nodule     32m RLL nodule on CT Chest  07/2009  . Irritable bowel syndrome   . Hepatitis, unspecified     hx of  . Depressive disorder, not elsewhere classified   . Colon polyps   . Anemia   . Hypertension   . Glucose intolerance (impaired glucose tolerance)     hgb AIC 6.1 09/02/2009  . Hx of hysterectomy 2002  . Ankle fracture, right     x 2  . Noncompliance   . PONV (postoperative nausea and vomiting)   . Antineoplastic and immunosuppressive drugs causing adverse effect in therapeutic use   . Gout   . Sleep disturbance 06/2013    sleep study, mild abnormality, no criteria for CPAP  . Diverticulosis of colon 2009    colonoscopy  . Colon polyps 2009    Past Surgical History  Procedure Laterality Date  . Cholecystectomy    . Cardioversion      x 2  . Hernia repair  2009  . Abdominal hysterectomy    . Colonoscopy      Diverticulosis, colon  polyps, repeat 2014, Dr. KDeatra Ina . Cardioversion N/A 02/27/2013    Procedure: CARDIOVERSION;  Surgeon: BLelon Perla MD;  [facility];  Service: Cardiovascular;  Laterality: N/A;  . Esophagogastroduodenoscopy N/A 05/16/2014    Procedure: ESOPHAGOGASTRODUODENOSCOPY (EGD);  Surgeon: SWonda Horner MD;  Location: [facility];  Service: Gastroenterology;  Laterality: N/A;    There were no vitals taken for this visit.  Visit Diagnosis:  Abnormality of gait  Lack of coordination      Subjective Assessment - 10/23/14 1020    Symptoms No new complaints. Reports no new falls and no pain today.   Currently in Pain? No/denies          [facility] - 10/23[facility]14 0001    Functional Gait  Assessment   Gait assessed  Yes   Gait Level Surface Walks 20 ft in less than 5.5 sec, no assistive devices, good speed, no evidence for imbalance, normal gait pattern, deviates no more than 6 in outside of the 12 in walkway width.   Change in Gait Speed Able to smoothly change walking speed without loss of balance or gait deviation. Deviate no more than 6 in outside  of the 12 in walkway width.   Gait with Horizontal Head Turns Performs head turns smoothly with no change in gait. Deviates no more than 6 in outside 12 in walkway width   Gait with Vertical Head Turns Performs head turns with no change in gait. Deviates no more than 6 in outside 12 in walkway width.   Gait and Pivot Turn Pivot turns safely in greater than 3 sec and stops with no loss of balance, or pivot turns safely within 3 sec and stops with mild imbalance, requires small steps to catch balance.   Step Over Obstacle Is able to step over 2 stacked shoe boxes taped together (9 in total height) without changing gait speed. No evidence of imbalance.   Gait with Narrow Base of Support Ambulates 4-7 steps.   Gait with Eyes Closed Walks 20 ft, uses assistive device, slower speed, mild gait deviations, deviates 6-10 in outside 12 in walkway  width. Ambulates 20 ft in less than 9 sec but greater than 7 sec.   Ambulating Backwards Walks 20 ft, uses assistive device, slower speed, mild gait deviations, deviates 6-10 in outside 12 in walkway width.   Steps Alternating feet, must use rail.   Total Score 24           [facility] Adult PT Treatment/Exercise - 10/23/14 0001    Timed Up and Go Test   Manual TUG (seconds) 11.35     Treatment continued: - at counter: heel walk forward/backward, toe walk forward/backward, tandem walk forward/backward and figure 8 turns x 3-4 laps each with occasional UE assist needed on counter top. - in corner on 2 pillows: feet together: eyes closed hold 15 sec x 3 reps, eyes closed with head turns and nods x 10 each.  * All the above issued as HEP.        PT Education - 10/23/14 1310    Education provided Yes   Education Details balance HEP   Person(s) Educated Patient   Methods Explanation;Demonstration;Handout   Comprehension Verbalized understanding;Verbal cues required          PT Short Term Goals - 10/04/14 1229    PT SHORT TERM GOAL #1   Title Demonstrate correct performance of HEP. Target date: 11/02/14   PT SHORT TERM GOAL #2   Title Demonstrate ability to stand on airex foam with eyes closed x 30 seconds without loss of balance or UE support for improved utilization of vestibular system. Target date: 11/02/14   PT SHORT TERM GOAL #3   Title Maintain TUG time <9 seconds without staggering or loss of balance. Target date: 11/02/14   PT SHORT TERM GOAL #4   Title Complete functional gait assessment and write appropriate STG/LTG   PT SHORT TERM GOAL #5   Title Ascend/descend curb and ramp independently to prepare for safe negotiation of hill to access bus stop. Target date: 11/02/14           PT Long Term Goals - 10/04/14 1235    PT LONG TERM GOAL #1   Title Increase FOTO functional status to 73 points for improved quality of life. To be met by 11/20/14   PT LONG TERM GOAL #2    Title Verbalize understanding of fall prevention techniques. To be met by 11/20/14   PT LONG TERM GOAL #3   Title Increase Functional Gait Assessment Score to _________________________ for decreaesd fall risk. To be met by 11/20/14   PT LONG TERM GOAL #4   Title Complete  TUG manual and write appropriate goal:_________________________________   PT LONG TERM GOAL #5   Title Report increased ability to negotiate hill to access bus stop with supervision for increased community access. To be met by 11/20/14               Plan - 10/23/14 1052    Clinical Impression Statement Pt given an HEP for balance today. Progressing well towards goals.   Rehab Potential Good   Clinical Impairments Affecting Rehab Potential depression, medications-possible side effects   PT Frequency 2x / week   PT Duration 8 weeks   PT Treatment/Interventions ADLs/Self Care Home Management;Therapeutic activities;Therapeutic exercise;Balance training;Gait training;Stair training;Neuromuscular re-education;Patient/family education   PT Next Visit Plan Continue with balance and strengthening activites.        Problem List Patient Active Problem List   Diagnosis Date Noted  . Black-out (not amnesia) 08/22/2014  . Dysmetria 08/22/2014  . Concussion with loss of consciousness 08/22/2014  . Ataxia 08/22/2014  . Abnormality of gait 08/22/2014  . Alcohol abuse 01/08/2014  . Bronchitis 01/28/2011  . Cardiomyopathy, secondary 01/28/2011  . FATIGUE 12/16/2010  . [place] 03/03/2010  . PNEUMONIA 10/25/2009  . GERD 10/23/2009  . CHRONIC DIASTOLIC HEART FAILURE 59/56/3875  . GLUCOSE INTOLERANCE 09/02/2009  . ANXIETY STATE, UNSPECIFIED 09/02/2009  . HYPERTENSION 09/02/2009  . SARCOIDOSIS 08/20/2009  . DYSLIPIDEMIA 08/20/2009  . OBESITY 08/20/2009  . ANEMIA 08/20/2009  . Atrial fibrillation 08/20/2009  . LUNG NODULE 08/20/2009  . SHORTNESS OF BREATH 08/20/2009  . DEPRESSIVE DISORDER NOT ELSEWHERE  CLASSIFIED 11/21/2007  . CHRONIC VENOUS HYPERTENSION WITHOUT COMPS 11/21/2007    Willow Ora 10/23/2014, 1:13 PM  Willow Ora, PTA, [place] [address], [place], [place] [address] [phone] 10/23/2014, 1:13 PM

## 2014-10-25 ENCOUNTER — Encounter: Payer: Self-pay | Admitting: Medical

## 2014-10-25 ENCOUNTER — Ambulatory Visit (INDEPENDENT_AMBULATORY_CARE_PROVIDER_SITE_OTHER): Payer: Medicare Other | Admitting: Medical

## 2014-10-25 ENCOUNTER — Telehealth: Payer: Self-pay | Admitting: Medical

## 2014-10-25 ENCOUNTER — Ambulatory Visit: Payer: Medicare Other

## 2014-10-25 VITALS — BP 138/70 | HR 60 | Temp 98.0°F | Resp 15 | Wt 267.0 lb

## 2014-10-25 DIAGNOSIS — R269 Unspecified abnormalities of gait and mobility: Secondary | ICD-10-CM

## 2014-10-25 DIAGNOSIS — I481 Persistent atrial fibrillation: Secondary | ICD-10-CM | POA: Diagnosis not present

## 2014-10-25 DIAGNOSIS — E785 Hyperlipidemia, unspecified: Secondary | ICD-10-CM | POA: Diagnosis not present

## 2014-10-25 DIAGNOSIS — R251 Tremor, unspecified: Secondary | ICD-10-CM | POA: Diagnosis not present

## 2014-10-25 DIAGNOSIS — F411 Generalized anxiety disorder: Secondary | ICD-10-CM | POA: Diagnosis not present

## 2014-10-25 DIAGNOSIS — G4700 Insomnia, unspecified: Secondary | ICD-10-CM

## 2014-10-25 DIAGNOSIS — R748 Abnormal levels of other serum enzymes: Secondary | ICD-10-CM

## 2014-10-25 DIAGNOSIS — M1000 Idiopathic gout, unspecified site: Secondary | ICD-10-CM | POA: Diagnosis not present

## 2014-10-25 DIAGNOSIS — R270 Ataxia, unspecified: Secondary | ICD-10-CM

## 2014-10-25 DIAGNOSIS — H9203 Otalgia, bilateral: Secondary | ICD-10-CM | POA: Diagnosis not present

## 2014-10-25 DIAGNOSIS — R279 Unspecified lack of coordination: Secondary | ICD-10-CM | POA: Diagnosis not present

## 2014-10-25 DIAGNOSIS — I10 Essential (primary) hypertension: Secondary | ICD-10-CM | POA: Diagnosis not present

## 2014-10-25 DIAGNOSIS — I4819 Other persistent atrial fibrillation: Secondary | ICD-10-CM

## 2014-10-25 DIAGNOSIS — R7301 Impaired fasting glucose: Secondary | ICD-10-CM

## 2014-10-25 DIAGNOSIS — R7989 Other specified abnormal findings of blood chemistry: Secondary | ICD-10-CM

## 2014-10-25 LAB — BASIC METABOLIC PANEL
BUN: 13 mg/dL (ref 6–23)
CALCIUM: 8.8 mg/dL (ref 8.4–10.5)
CO2: 28 mEq/L (ref 19–32)
Chloride: 102 mEq/L (ref 96–112)
Creat: 1.2 mg/dL — ABNORMAL HIGH (ref 0.50–1.10)
Glucose, Bld: 119 mg/dL — ABNORMAL HIGH (ref 70–99)
POTASSIUM: 4.2 meq/L (ref 3.5–5.3)
Sodium: 139 mEq/L (ref 135–145)

## 2014-10-25 LAB — HEMOGLOBIN A1C
Hgb A1c MFr Bld: 6.2 % — ABNORMAL HIGH (ref <5.7)
Mean Plasma Glucose: 131 mg/dL — ABNORMAL HIGH (ref <117)

## 2014-10-25 MED ORDER — FLUOXETINE HCL 20 MG PO TABS: 20.0000 mg | ORAL_TABLET | Freq: Every day | ORAL | Status: DC

## 2014-10-25 NOTE — Telephone Encounter (Signed)
Please contact Dr. Stefani Dama nurse and let them know that I saw Stephanie [name] today.  She doesn't seem to know when her follow-up appointment or MRI appointment is scheduled, so we wanted to call them as a courtesy to see if they could follow-up with the patient about these 2 issues.    Also when she comes in for her follow-up with Dr. Roddie Mc, please ask that they discuss her sleep issues as well.  We would appreciate their input on this issue

## 2014-10-25 NOTE — Therapy (Addendum)
[facility] [address] [place], [place], [address] Phone: [phone]   Fax:  [phone]  Physical Therapy Treatment  Patient Details  Name: Stephanie [name] MRN: [id number] Date of Birth: [birth year] Referring Provider:  Jac Canavan, PA-C  Encounter Date: 10/25/2014      PT End of Session - 10/25/14 1141    Visit Number 3   Number of Visits 17   Date for PT Re-Evaluation 11/30/14   Authorization Type Medicare G code every 10th visit   PT Start Time 1102   PT Stop Time 1145   PT Time Calculation (min) 43 min      Past Medical History  Diagnosis Date  . Coronary artery disease     no CAD by cath 11.2010  . Diastolic CHF, chronic   . Persistent atrial fibrillation     s/p DCCV 07/2009; Pradaxa Rx  . Dyslipidemia   . Chronic venous hypertension without complications   . Sarcoidosis     dx by eye exam  . Obesity   . Anemia   . Lung nodule     46mm RLL nodule on CT Chest  07/2009  . Irritable bowel syndrome   . Hepatitis, unspecified     hx of  . Depressive disorder, not elsewhere classified   . Colon polyps   . Anemia   . Hypertension   . Glucose intolerance (impaired glucose tolerance)     hgb AIC 6.1 09/02/2009  . Hx of hysterectomy 2002  . Ankle fracture, right     x 2  . Noncompliance   . PONV (postoperative nausea and vomiting)   . Antineoplastic and immunosuppressive drugs causing adverse effect in therapeutic use   . Gout   . Sleep disturbance 06/2013    sleep study, mild abnormality, no criteria for CPAP  . Diverticulosis of colon 2009    colonoscopy  . Colon polyps 2009    Past Surgical History  Procedure Laterality Date  . Cholecystectomy    . Cardioversion      x 2  . Hernia repair  2009  . Abdominal hysterectomy    . Colonoscopy      Diverticulosis, colon polyps, repeat 2014, Dr. Arlyce Dice  . Cardioversion N/A 02/27/2013    Procedure: CARDIOVERSION;  Surgeon: Lewayne Bunting, MD;  Location: [facility] ENDOSCOPY;  Service: Cardiovascular;  Laterality: N/A;  . Esophagogastroduodenoscopy N/A 05/16/2014    Procedure: ESOPHAGOGASTRODUODENOSCOPY (EGD);  Surgeon: Graylin Shiver, MD;  Location: [facility] ORS;  Service: Gastroenterology;  Laterality: N/A;    There were no vitals taken for this visit.  Visit Diagnosis:  Abnormality of gait  Lack of coordination      Subjective Assessment - 10/25/14 1103    Symptoms No new complaints. Reports no new falls and no pain today.   Currently in Pain? No/denies       Reviewed pt's HEP with good performance, cues needed to slow down, to keep feet together on corner balance activities, and to be sure and perform the counter exercises both forwards and backwards  Forward/backward stepping over 4"x4" obstacle with poor balance/control with backward stepping Retro walking with emphasis on control Forward and retro walking stepping over 1"x4" obstacles with MAX verbal cues for form to improve balance and control Anterior/posterior limits of stability on rockerboard with eyes open (ankle strategy) with BUE support, then with MIN A for full excursion, then with supervision and no UE support; progressed to trial with eyes closed, but  unsteady and unable unless holding rails. Downgraded to perform on solid surface without UE support with eyes open, progressed to eyes closed with supervision.   Therex: Pt requested to use the SCI fit for her knees. This is also beneficial for improving endurance as pt did require frequent rest breaks during therapy session today. Completed 8 minutes with all extremities at level 1.1.                       PT Short Term Goals - 10/25/14 1153    PT SHORT TERM GOAL #1   Title Demonstrate correct performance of HEP. Target date: 11/02/14   PT SHORT TERM GOAL #2   Title Demonstrate ability to stand on airex foam with eyes closed x 30 seconds without loss of balance or UE support for improved  utilization of vestibular system. Target date: 11/02/14   PT SHORT TERM GOAL #3   Title Maintain TUG time <9 seconds without staggering or loss of balance. Target date: 11/02/14   PT SHORT TERM GOAL #4   Title Increase FGA score to 24/30 for improving balance. target 11/02/14   PT SHORT TERM GOAL #5   Title Ascend/descend curb and ramp independently to prepare for safe negotiation of hill to access bus stop. Target date: 11/02/14           PT Long Term Goals - 10/25/14 1154    PT LONG TERM GOAL #1   Title Increase FOTO functional status to 73 points for improved quality of life. To be met by 11/20/14   PT LONG TERM GOAL #2   Title Verbalize understanding of fall prevention techniques. To be met by 11/20/14   PT LONG TERM GOAL #3   Title Increase Functional Gait Assessment Score to 29/30 for decreaesd fall risk. To be met by 11/20/14   PT LONG TERM GOAL #4   Title decrease TUG manual time to 10.3 seconds for improved balance and efficiency with carrying objects while ambulating.   PT LONG TERM GOAL #5   Title Report increased ability to negotiate hill to access bus stop with supervision for increased community access. To be met by 11/20/14               Plan - 10/25/14 1141    Clinical Impression Statement Pt is making progress toward goals. Will continue to benefit from skilled pt services for improved balance, endurance, and safety with mobility.   PT Next Visit Plan Continue with balance on unstable surfaces, and retro walking activities        Problem List Patient Active Problem List   Diagnosis Date Noted  . Black-out (not amnesia) 08/22/2014  . Dysmetria 08/22/2014  . Concussion with loss of consciousness 08/22/2014  . Ataxia 08/22/2014  . Abnormality of gait 08/22/2014  . Alcohol abuse 01/08/2014  . Bronchitis 01/28/2011  . Cardiomyopathy, secondary 01/28/2011  . FATIGUE 12/16/2010  . [place], [place] 03/03/2010  . PNEUMONIA 10/25/2009  . GERD 10/23/2009  .  CHRONIC DIASTOLIC HEART FAILURE 72/53/6644  . GLUCOSE INTOLERANCE 09/02/2009  . ANXIETY STATE, UNSPECIFIED 09/02/2009  . HYPERTENSION 09/02/2009  . SARCOIDOSIS 08/20/2009  . DYSLIPIDEMIA 08/20/2009  . OBESITY 08/20/2009  . ANEMIA 08/20/2009  . Atrial fibrillation 08/20/2009  . LUNG NODULE 08/20/2009  . SHORTNESS OF BREATH 08/20/2009  . DEPRESSIVE DISORDER NOT ELSEWHERE CLASSIFIED 11/21/2007  . CHRONIC VENOUS HYPERTENSION WITHOUT COMPS 11/21/2007    Delrae Sawyers D 10/25/2014, 11:57 AM  [facility] [address] [place], [place], [address] Phone: [phone]   Fax:  [phone]

## 2014-10-25 NOTE — Progress Notes (Signed)
Subjective: Here for recheck/follow-up on several issues.  At her last visit in late October she had numerous complaints at that time, and we referred to neurology at that time.     Since seeing the neurologist she has been going to rehabilitation to help with balance issues and gait disturbance.  She is not sure when she is supposed to get her MRI of the brain. She is not sure when she supposed to go back and see Dr. Roddie Mc. Since last visit she also had a follow-up with her cardiologist who said that some of her symptoms are probably related to her medications in her words.  He took her off one blood pressure medication and lower the dose of the amiodarone.  She hasn't noticed any major improvement yet, she is still going to the rehabilitation center weekly.  Regarding anxiety, she continues on fluoxetine, had been out of Xanax for a couple months but on average uses it a few times per week.  She has not picked up the refill that was sent this week. She feels like this combination of medication works well for her anxiety  She has long history of sleep issues, uses Ambien occasionally, not nightly, still has plenty of this left.  She does not use this and Xanax simultaneously.  She seemed irritated I asked about this today.  She says she did mention sleep issues to Dr. Roddie Mc  She's been having some ear concerns, dry cough, sometimes wheezing, ear pains, wonders about an ear infection.  Felt subjective fever last week.  Feels some congestion. Denies any recent leg swelling, weight changes chest pain. No productive cough.  She is worried about cancer, has had several people in her family and friends recently died of cancer.  She has not yet went for chest CT to recheck pulmonary nodule, last mammogram in November 2014, has not yet had her colonoscopy repeat.  She is status post hysterectomy due to fibroids but still has her ovaries  Hyperlipidemia-she didn't tolerate Crestor or Lipitor, so far is  taking simvastatin some without problem  No other complaint  Review of systems in subjective otherwise  Objective: BP 138/70 mmHg  Pulse 60  Temp(Src) 98 F (36.7 C) (Oral)  Resp 15  Wt 267 lb (121.11 kg)  General appearance: alert, no distress, WD/WN HEENT: normocephalic, sclerae anicteric, PERRLA, EOMi, nares patent, no discharge or erythema, pharynx normal Neck: supple, no lymphadenopathy, no thyromegaly, no masses Heart: RRR, normal S1, S2, no murmurs Lungs: CTA bilaterally, no wheezes, rhonchi, or rales Extremities: no edema, no cyanosis, no clubbing Pulses: 2+ symmetric, upper and lower extremities, normal cap refill Psychiatric: normal affect, behavior normal, pleasant     Assessment: Encounter Diagnoses  Name Primary?  Marland Kitchen Anxiety state Yes  . Insomnia   . Ataxia   . Tremor   . Elevated serum creatinine   . Impaired fasting blood sugar   . Persistent atrial fibrillation   . Essential hypertension   . Ear discomfort, bilateral   . Idiopathic gout, unspecified chronicity, unspecified site   . Hyperlipidemia       Plan: Anxiety- she states she is taking the Fluoxetine, not using xanax very often.   Overall feels like things are going ok in regards to anxiety other than dealing with recent death's in the family and of friends.   Insomnia-  Does ok on Ambien, uses periodically.   advised she discuss sleep/insomnia with Dr. Roddie Mc on next visit.  Ataxia, tremor- has f/u with neurology,  going through PT currently.  Elevated creatinine-repeat lab today  Impaired fasting glucose-repeat hemoglobin A1c today, advise stricter diet, she reports drinking several soft drinks in recent weeks.  Atrial fibrillation, hypertension-managed by cardiology, amiodarone dose was recently decreased due to her neurologic symptoms.  He also has not been taking allopurinol due to medication interactions, but has not had any gout issues of recent.  F/u with cardiology as planned.    Ear discomfort- ears look normal.  Can use nasal saline, OTC plain mucinex for the next few days.  Call/return if not improving.  hyperlipidemia-continue simvastatin for now as she is tolerating this somewhat  Pulmonary nodule- once she has more clarification or success on the gait abnormality and tremor, we can get the chest CT f/u.  This is a lesser priority at the moment.  Plan to do this after the colonoscopy.  concerns for cancer-  Pending consult for repeat colonoscopy, s/p hysterectomy for fibroids, mammogram is due.  She can return for physical/exam.

## 2014-10-26 ENCOUNTER — Telehealth: Payer: Self-pay | Admitting: Neurology

## 2014-10-26 NOTE — Telephone Encounter (Signed)
I called and I left a detailed message for Dr. Robbi Garter nurse at [facility] from [place]

## 2014-10-26 NOTE — Telephone Encounter (Signed)
Pt was seen in our office and she is unaware of her MRI and needs to know when that will be scheduled.  Dorothea Ogle, PA ask that when she comes back to our office she needs to be seen for sleep.  I did advise that the pt would need a referral for a new problem.

## 2014-10-30 ENCOUNTER — Other Ambulatory Visit: Payer: Self-pay | Admitting: *Deleted

## 2014-10-30 ENCOUNTER — Ambulatory Visit: Payer: Medicare Other

## 2014-10-30 DIAGNOSIS — R270 Ataxia, unspecified: Secondary | ICD-10-CM

## 2014-10-30 DIAGNOSIS — S060X2D Concussion with loss of consciousness of 31 minutes to 59 minutes, subsequent encounter: Secondary | ICD-10-CM

## 2014-10-30 DIAGNOSIS — R55 Syncope and collapse: Secondary | ICD-10-CM

## 2014-10-30 DIAGNOSIS — R269 Unspecified abnormalities of gait and mobility: Secondary | ICD-10-CM

## 2014-10-30 DIAGNOSIS — R278 Other lack of coordination: Secondary | ICD-10-CM

## 2014-10-30 NOTE — Progress Notes (Signed)
Order changed from CT to MRI C spine w/wo per Dr. Edwena Felty order.

## 2014-10-30 NOTE — Addendum Note (Signed)
Addended byOliver Hum on: 10/30/2014 05:13 PM   Modules accepted: Orders

## 2014-10-31 ENCOUNTER — Telehealth: Payer: Self-pay

## 2014-10-31 NOTE — Telephone Encounter (Signed)
See telephone encounter.

## 2014-11-01 ENCOUNTER — Telehealth: Payer: Self-pay | Admitting: *Deleted

## 2014-11-01 ENCOUNTER — Ambulatory Visit: Payer: Medicare Other | Admitting: Physical Therapy

## 2014-11-01 ENCOUNTER — Encounter (HOSPITAL_COMMUNITY): Payer: Self-pay | Admitting: Internal Medicine

## 2014-11-01 DIAGNOSIS — R279 Unspecified lack of coordination: Secondary | ICD-10-CM

## 2014-11-01 DIAGNOSIS — R269 Unspecified abnormalities of gait and mobility: Secondary | ICD-10-CM | POA: Diagnosis not present

## 2014-11-01 NOTE — Telephone Encounter (Signed)
[employer] Scat form on [employer].

## 2014-11-01 NOTE — Therapy (Signed)
[facility] [address] [place], [place], [address] Phone: [phone]   Fax:  [phone]  Physical Therapy Treatment  Patient Details  Name: Stephanie [name] MRN: [id number] Date of Birth: 1951/02/06 Referring Provider:  Carlena Hurl, PA-C  Encounter Date: 11/01/2014      PT End of Session - 11/01/14 1355    Visit Number 4   Number of Visits 17   Date for PT Re-Evaluation 11/30/14   Authorization Type Medicare G code every 10th visit   PT Start Time 1321   PT Stop Time 1400   PT Time Calculation (min) 39 min   Equipment Utilized During Treatment Gait belt   Activity Tolerance Patient tolerated treatment well   Behavior During Therapy [facility] for tasks assessed/performed      Past Medical History  Diagnosis Date  . Coronary artery disease     no CAD by cath 11.2010  . Diastolic CHF, chronic   . Persistent atrial fibrillation     s/p DCCV 07/2009; Pradaxa Rx  . Dyslipidemia   . Chronic venous hypertension without complications   . Sarcoidosis     dx by eye exam  . Obesity   . Anemia   . Lung nodule     27m RLL nodule on CT Chest  07/2009  . Irritable bowel syndrome   . Hepatitis, unspecified     hx of  . Depressive disorder, not elsewhere classified   . Colon polyps   . Anemia   . Hypertension   . Glucose intolerance (impaired glucose tolerance)     hgb AIC 6.1 09/02/2009  . Hx of hysterectomy 2002  . Ankle fracture, right     x 2  . Noncompliance   . PONV (postoperative nausea and vomiting)   . Antineoplastic and immunosuppressive drugs causing adverse effect in therapeutic use   . Gout   . Sleep disturbance 06/2013    sleep study, mild abnormality, no criteria for CPAP  . Diverticulosis of colon 2009    colonoscopy  . Colon polyps 2009    Past Surgical History  Procedure Laterality Date  . Cholecystectomy    . Cardioversion      x 2  . Hernia repair  2009  . Abdominal hysterectomy     . Colonoscopy      Diverticulosis, colon polyps, repeat 2014, Dr. KDeatra Ina . Cardioversion N/A 02/27/2013    Procedure: CARDIOVERSION;  Surgeon: BLelon Perla MD;  [facility];  Service: Cardiovascular;  Laterality: N/A;  . Esophagogastroduodenoscopy N/A 05/16/2014    Procedure: ESOPHAGOGASTRODUODENOSCOPY (EGD);  Surgeon: SWonda Horner MD;  Location: [facility] ORS;  Service: Gastroenterology;  Laterality: N/A;    There were no vitals taken for this visit.  Visit Diagnosis:  Abnormality of gait  Lack of coordination      Subjective Assessment - 11/01/14 1326    Symptoms No new complaints. Reports no new falls and no pain today.   Currently in Pain? No/denies          [facility] - 11/01/14 1331    Functional Gait  Assessment   Gait assessed  Yes   Gait Level Surface Walks 20 ft in less than 5.5 sec, no assistive devices, good speed, no evidence for imbalance, normal gait pattern, deviates no more than 6 in outside of the 12 in walkway width.   Change in Gait Speed Able to smoothly change walking speed without loss of balance or gait deviation.  Deviate no more than 6 in outside of the 12 in walkway width.   Gait with Horizontal Head Turns Performs head turns smoothly with no change in gait. Deviates no more than 6 in outside 12 in walkway width   Gait with Vertical Head Turns Performs head turns with no change in gait. Deviates no more than 6 in outside 12 in walkway width.   Gait and Pivot Turn Pivot turns safely within 3 sec and stops quickly with no loss of balance.   Step Over Obstacle Is able to step over 2 stacked shoe boxes taped together (9 in total height) without changing gait speed. No evidence of imbalance.   Gait with Narrow Base of Support Ambulates 7-9 steps.   Gait with Eyes Closed Walks 20 ft, no assistive devices, good speed, no evidence of imbalance, normal gait pattern, deviates no more than 6 in outside 12 in walkway width. Ambulates 20 ft in less  than 7 sec.   Ambulating Backwards Walks 20 ft, uses assistive device, slower speed, mild gait deviations, deviates 6-10 in outside 12 in walkway width.   Steps Alternating feet, no rail.   Total Score 28           [facility] Adult PT Treatment/Exercise - 11/01/14 1327    Ambulation/Gait   Ramp 7: Independent  x2 reps without AD   Curb 7: Independent  x2 without AD   Timed Up and Go Test   Manual TUG (seconds) 9.34     Neuro Re-ed: - performed current HEP in corner and at counter with supervision and occasional cue to slow down and/or for posture.  There-ex: - Scifit x 4 extremities level 1.5 x 8 minutes with goal rpm >/= 55 for strengthening and activity tolerance.        PT Short Term Goals - 11/01/14 1357    PT SHORT TERM GOAL #1   Title Demonstrate correct performance of HEP. Target date: 11/02/14   Baseline met on 11/01/2014   Status Achieved   PT SHORT TERM GOAL #2   Title Demonstrate ability to stand on airex foam with eyes closed x 30 seconds without loss of balance or UE support for improved utilization of vestibular system. Target date: 11/02/14   Baseline met on 11/01/2014   Status Achieved   PT SHORT TERM GOAL #3   Title Maintain TUG time <9 seconds without staggering or loss of balance. Target date: 11/02/14   Baseline 9.  sec on 11/01/2014   Status Not Met   PT SHORT TERM GOAL #4   Title Increase FGA score to 24/30 for improving balance. target 11/02/14   Baseline met on 11/01/2014 with score 28/30   PT SHORT TERM GOAL #5   Title Ascend/descend curb and ramp independently to prepare for safe negotiation of hill to access bus stop. Target date: 11/02/14   Baseline met on 11/01/2014   Status Achieved           PT Long Term Goals - 10/25/14 1154    PT LONG TERM GOAL #1   Title Increase FOTO functional status to 73 points for improved quality of life. To be met by 11/20/14   PT LONG TERM GOAL #2   Title Verbalize understanding of fall prevention techniques. To be  met by 11/20/14   PT LONG TERM GOAL #3   Title Increase Functional Gait Assessment Score to 29/30 for decreaesd fall risk. To be met by 11/20/14   PT LONG TERM GOAL #4  Title decrease TUG manual time to 10.3 seconds for improved balance and efficiency with carrying objects while ambulating.   PT LONG TERM GOAL #5   Title Report increased ability to negotiate hill to access bus stop with supervision for increased community access. To be met by 11/20/14           Plan - 11/01/14 1355    Clinical Impression Statement STG's checked today with all met except for goal number 3, which was not met. Pt progressing toward LTG's as well.   Rehab Potential Good   Clinical Impairments Affecting Rehab Potential depression, medications-possible side effects   PT Frequency 2x / week   PT Duration 8 weeks   PT Treatment/Interventions ADLs/Self Care Home Management;Therapeutic activities;Therapeutic exercise;Balance training;Gait training;Stair training;Neuromuscular re-education;Patient/family education   PT Next Visit Plan Continue with balance on unstable surfaces and retro walking activites.        Problem List Patient Active Problem List   Diagnosis Date Noted  . Black-out (not amnesia) 08/22/2014  . Dysmetria 08/22/2014  . Concussion with loss of consciousness 08/22/2014  . Ataxia 08/22/2014  . Abnormality of gait 08/22/2014  . Alcohol abuse 01/08/2014  . Bronchitis 01/28/2011  . Cardiomyopathy, secondary 01/28/2011  . FATIGUE 12/16/2010  . [place], [place] 03/03/2010  . PNEUMONIA 10/25/2009  . GERD 10/23/2009  . CHRONIC DIASTOLIC HEART FAILURE 55/25/8948  . GLUCOSE INTOLERANCE 09/02/2009  . ANXIETY STATE, UNSPECIFIED 09/02/2009  . HYPERTENSION 09/02/2009  . SARCOIDOSIS 08/20/2009  . DYSLIPIDEMIA 08/20/2009  . OBESITY 08/20/2009  . ANEMIA 08/20/2009  . Atrial fibrillation 08/20/2009  . LUNG NODULE 08/20/2009  . SHORTNESS OF BREATH 08/20/2009  . DEPRESSIVE DISORDER NOT  ELSEWHERE CLASSIFIED 11/21/2007  . CHRONIC VENOUS HYPERTENSION WITHOUT COMPS 11/21/2007    Willow Ora 11/02/2014, 1:15 PM  Willow Ora, PTA, [facility] [address] [place], [place] [address] [phone] 11/02/2014, 1:16 PM

## 2014-11-06 ENCOUNTER — Ambulatory Visit: Payer: Medicare Other | Admitting: Physical Therapy

## 2014-11-07 NOTE — Telephone Encounter (Signed)
Form,GTA SCAT received,completed by Dr Brett Fairy and Lovey Newcomer at front desk for patient 11/07/14

## 2014-11-08 ENCOUNTER — Ambulatory Visit: Payer: Medicare Other | Admitting: Rehabilitative and Restorative Service Providers"

## 2014-11-08 DIAGNOSIS — R269 Unspecified abnormalities of gait and mobility: Secondary | ICD-10-CM

## 2014-11-08 DIAGNOSIS — R279 Unspecified lack of coordination: Secondary | ICD-10-CM | POA: Diagnosis not present

## 2014-11-08 NOTE — Therapy (Signed)
[facility] [address] [place], [place], [address] Phone: [phone]   Fax:  [phone]  Physical Therapy Treatment  Patient Details  Name: Stephanie [name] MRN: [id number] Date of Birth: [birth year] Referring Provider:  Carlena Hurl, PA-C  Encounter Date: 11/08/2014      PT End of Session - 11/08/14 1206    Visit Number 5   Number of Visits 17   Date for PT Re-Evaluation 11/30/14   Authorization Type Medicare G code every 10th visit   PT Start Time 1110   PT Stop Time 1150   PT Time Calculation (min) 40 min   Equipment Utilized During Treatment Gait belt   Activity Tolerance Patient tolerated treatment well   Behavior During Therapy [facility] for tasks assessed/performed      Past Medical History  Diagnosis Date  . Coronary artery disease     no CAD by cath 11.2010  . Diastolic CHF, chronic   . Persistent atrial fibrillation     s/p DCCV 07/2009; Pradaxa Rx  . Dyslipidemia   . Chronic venous hypertension without complications   . Sarcoidosis     dx by eye exam  . Obesity   . Anemia   . Lung nodule     56m RLL nodule on CT Chest  07/2009  . Irritable bowel syndrome   . Hepatitis, unspecified     hx of  . Depressive disorder, not elsewhere classified   . Colon polyps   . Anemia   . Hypertension   . Glucose intolerance (impaired glucose tolerance)     hgb AIC 6.1 09/02/2009  . Hx of hysterectomy 2002  . Ankle fracture, right     x 2  . Noncompliance   . PONV (postoperative nausea and vomiting)   . Antineoplastic and immunosuppressive drugs causing adverse effect in therapeutic use   . Gout   . Sleep disturbance 06/2013    sleep study, mild abnormality, no criteria for CPAP  . Diverticulosis of colon 2009    colonoscopy  . Colon polyps 2009    Past Surgical History  Procedure Laterality Date  . Cholecystectomy    . Cardioversion      x 2  . Hernia repair  2009  . Abdominal hysterectomy     . Colonoscopy      Diverticulosis, colon polyps, repeat 2014, Dr. KDeatra Ina . Cardioversion N/A 02/27/2013    Procedure: CARDIOVERSION;  Surgeon: BLelon Perla MD;  [facility];  Service: Cardiovascular;  Laterality: N/A;  . Esophagogastroduodenoscopy N/A 05/16/2014    Procedure: ESOPHAGOGASTRODUODENOSCOPY (EGD);  Surgeon: SWonda Horner MD;  Location: [facility];  Service: Gastroenterology;  Laterality: N/A;    There were no vitals taken for this visit.  Visit Diagnosis:  Abnormality of gait      Subjective Assessment - 11/08/14 1114    Symptoms The patient reports decreased high level balance noticing staggering at times during daily tasks.  She reports she sometimes feels it may be related to not eating until mid afternoon.  She reports staggering usually improves with eating.  She also reports occasional night sweats.     Currently in Pain? No/denies       SELF CARE/HOME MANAGEMENT: Discussed meal times being more consistent and recommended eating prior to taking morning meds (recommended check meds or with pharmacist if needed). Discussed safe walking and provided information to progress endurance and activity tolerance. Recommended moving more t/o the day. Provided resources for community  wellness programs.  Gait: Treadmill x 5 minutes with bilateral UE support from 1.2 mph up to 1.4 mph level surfaces with supervision. Gait with heel walking and then toe walking with SBA x 50 ft x 2-3 reps. Gait with cues on longer stride x 250 ft x 2 reps.  NEUROMUSCULAR RE-EDUCATION: "up/up down/down" x 10 reps right and left legs leading for coordination and sequencing/balance. Side stepping with supervision x 40 ft x 2 reps Alternating LE foot taps to 6" surface. Marching activities.       PT Education - 11/08/14 1158    Education provided Yes   Education Details [employer] classes, [employer] open [employer] scholarship, home walking   Person(s) Educated Patient   Methods  Explanation;Demonstration;Handout   Comprehension Verbalized understanding          PT Short Term Goals - 11/01/14 1357    PT SHORT TERM GOAL #1   Title Demonstrate correct performance of HEP. Target date: 11/02/14   Baseline met on 11/01/2014   Status Achieved   PT SHORT TERM GOAL #2   Title Demonstrate ability to stand on airex foam with eyes closed x 30 seconds without loss of balance or UE support for improved utilization of vestibular system. Target date: 11/02/14   Baseline met on 11/01/2014   Status Achieved   PT SHORT TERM GOAL #3   Title Maintain TUG time <9 seconds without staggering or loss of balance. Target date: 11/02/14   Baseline 9.  sec on 11/01/2014   Status Not Met   PT SHORT TERM GOAL #4   Title Increase FGA score to 24/30 for improving balance. target 11/02/14   Baseline met on 11/01/2014 with score 28/30   PT SHORT TERM GOAL #5   Title Ascend/descend curb and ramp independently to prepare for safe negotiation of hill to access bus stop. Target date: 11/02/14   Baseline met on 11/01/2014   Status Achieved           PT Long Term Goals - 10/25/14 1154    PT LONG TERM GOAL #1   Title Increase FOTO functional status to 73 points for improved quality of life. To be met by 11/20/14   PT LONG TERM GOAL #2   Title Verbalize understanding of fall prevention techniques. To be met by 11/20/14   PT LONG TERM GOAL #3   Title Increase Functional Gait Assessment Score to 29/30 for decreaesd fall risk. To be met by 11/20/14   PT LONG TERM GOAL #4   Title decrease TUG manual time to 10.3 seconds for improved balance and efficiency with carrying objects while ambulating.   PT LONG TERM GOAL #5   Title Report increased ability to negotiate hill to access bus stop with supervision for increased community access. To be met by 11/20/14               Plan - 11/08/14 1439    Clinical Impression Statement The patient tolerated high level walking tasks today without loss of balance.   She reports barriers to improving mobility of not exercising often enough.  PT provided education for wellness programs for continued exercise post d/c from PT.   PT Next Visit Plan Progress walking and activity tolerance, unlevel surfaces, negotiating hills (has a large hill near home)- may begin incline on treadmill.   Consulted and Agree with Plan of Care Patient        Problem List Patient Active Problem List   Diagnosis Date Noted  . Black-out (  not amnesia) 08/22/2014  . Dysmetria 08/22/2014  . Concussion with loss of consciousness 08/22/2014  . Ataxia 08/22/2014  . Abnormality of gait 08/22/2014  . Alcohol abuse 01/08/2014  . Bronchitis 01/28/2011  . Cardiomyopathy, secondary 01/28/2011  . FATIGUE 12/16/2010  . [place], [place] 03/03/2010  . PNEUMONIA 10/25/2009  . GERD 10/23/2009  . CHRONIC DIASTOLIC HEART FAILURE 42/76/7011  . GLUCOSE INTOLERANCE 09/02/2009  . ANXIETY STATE, UNSPECIFIED 09/02/2009  . HYPERTENSION 09/02/2009  . SARCOIDOSIS 08/20/2009  . DYSLIPIDEMIA 08/20/2009  . OBESITY 08/20/2009  . ANEMIA 08/20/2009  . Atrial fibrillation 08/20/2009  . LUNG NODULE 08/20/2009  . SHORTNESS OF BREATH 08/20/2009  . DEPRESSIVE DISORDER NOT ELSEWHERE CLASSIFIED 11/21/2007  . CHRONIC VENOUS HYPERTENSION WITHOUT COMPS 11/21/2007    Denorris Reust, PT 11/08/2014, 2:45 PM  [facility] [address] [place], [place], [address] Phone: [phone]   Fax:  [phone]

## 2014-11-08 NOTE — Patient Instructions (Signed)
AHOY class (free to residents of [place])  Airs on [employer] channel 22, M?F at [place] locations: [employer]. Center, [place] [phone] [place] [place], [address] [phone] [place], [place] [phone] [address]. [place], [place] [phone] [place], [place] [phone] [place] [address] Center, [address] [phone] [place]. Center, [address] [phone] [place] [place], [place] [phone] [facility] [place], [address] 373?[place], [address] 373?[place] location: Carloyn [address] [employer], [address] [phone]  *Remember to eat regular meals.  *Move more:  Walk in safe, level locations. Walking Program:  Begin walking for exercise for 5-10 minutes, 2 times/day, 4 days/week.   Progress your walking program by adding 2 minutes to your routine each week, as tolerated. Be sure to wear good walking shoes, walk in a safe environment and only progress to your tolerance.

## 2014-11-20 NOTE — Telephone Encounter (Signed)
the doctor will not be in called the patient to reschedule

## 2014-11-23 ENCOUNTER — Other Ambulatory Visit: Payer: Self-pay | Admitting: Medical

## 2014-11-23 ENCOUNTER — Ambulatory Visit: Payer: Medicare Other | Admitting: Adult Health

## 2014-11-27 ENCOUNTER — Encounter: Payer: Self-pay | Admitting: Adult Health

## 2014-11-27 ENCOUNTER — Ambulatory Visit (INDEPENDENT_AMBULATORY_CARE_PROVIDER_SITE_OTHER): Payer: Self-pay | Admitting: Adult Health

## 2014-11-27 VITALS — BP 149/71 | HR 68 | Ht 63.0 in | Wt 271.0 lb

## 2014-11-27 DIAGNOSIS — R269 Unspecified abnormalities of gait and mobility: Secondary | ICD-10-CM

## 2014-11-27 NOTE — Progress Notes (Signed)
Patient has not had MRI neck and brain completed. Scheduled for 2/10. Patient was not seen today. Rescheduled for March 7th after scans completed.

## 2014-11-28 ENCOUNTER — Ambulatory Visit
Admission: RE | Admit: 2014-11-28 | Discharge: 2014-11-28 | Disposition: A | Discharge status: Home / Self Care | Payer: Medicare Other | Source: Ambulatory Visit | Attending: Neurology | Admitting: Neurology

## 2014-11-28 ENCOUNTER — Telehealth: Payer: Self-pay

## 2014-11-28 DIAGNOSIS — R278 Other lack of coordination: Secondary | ICD-10-CM

## 2014-11-28 DIAGNOSIS — S060X2D Concussion with loss of consciousness of 31 minutes to 59 minutes, subsequent encounter: Secondary | ICD-10-CM

## 2014-11-28 DIAGNOSIS — R270 Ataxia, unspecified: Secondary | ICD-10-CM

## 2014-11-28 DIAGNOSIS — R269 Unspecified abnormalities of gait and mobility: Secondary | ICD-10-CM | POA: Diagnosis not present

## 2014-11-28 DIAGNOSIS — R55 Syncope and collapse: Secondary | ICD-10-CM

## 2014-11-28 MED ORDER — GADOBENATE DIMEGLUMINE 529 MG/ML IV SOLN: 20.0000 mL | Freq: Once | INTRAVENOUS | Status: AC | PRN

## 2014-11-28 NOTE — Telephone Encounter (Signed)
Patient had apt. With NP= Megan. Patient had not had test done MRI  and her apt. Was rescheduled because patient needed MRI. Patient is having MRI 11-28-14 and she has been rescheduled with [facility] 12-24-14. Marcie Bal and Jinny Blossom. talked to patient. Patient understood  And she was fine.

## 2014-12-06 NOTE — Progress Notes (Signed)
Quick Note:  Called and spoke to patient relayed normal MRI brain for her age good results. Patient understood. ______

## 2014-12-10 ENCOUNTER — Telehealth: Payer: Self-pay | Admitting: *Deleted

## 2014-12-10 NOTE — Telephone Encounter (Signed)
Please call patient and explain her MRI. The message she received was not clear. She needs a better understanding on what is going on.

## 2014-12-13 NOTE — Telephone Encounter (Signed)
Stephanie [name], patient called would like to review her MRI results with you. She has been notified but doesn't understand what she was told.

## 2014-12-14 NOTE — Telephone Encounter (Signed)
Patient is aware of Shane Tysinger PA message 

## 2014-12-14 NOTE — Telephone Encounter (Signed)
Patient called asking me to call her to go over her MRI results that Dr. Beacher May ordered. She doesn't understand what was told to her. Can you look at the MRI results and let me know how to explain this to her?

## 2014-12-14 NOTE — Telephone Encounter (Signed)
The neurology notes in the computer says she has follow-up with neurology on March 7 to discuss her MRI results. Just let her know that I can't comment on everything since I didn't order the test.

## 2014-12-18 ENCOUNTER — Telehealth: Payer: Self-pay | Admitting: *Deleted

## 2014-12-18 DIAGNOSIS — R937 Abnormal findings on diagnostic imaging of other parts of musculoskeletal system: Secondary | ICD-10-CM

## 2014-12-18 NOTE — Telephone Encounter (Signed)
-----   Message from Larey Seat, MD sent at 12/05/2014  5:01 PM EST ----- EXIT HOLE STENOSIS ON SEVERAL LEVELS OF CERVICAL SPINE NERVES. I WOULD LIKE TO SEND THIS PATIENT TO DR. Ellene Route ET AL.

## 2014-12-18 NOTE — Telephone Encounter (Signed)
Pt is calling back returning Sandy's call.  Please call back.

## 2014-12-19 NOTE — Telephone Encounter (Signed)
I called pt and relayed the results of the MRI C spine and Dr. Edwena Felty recommendation to see NS for evaluation.  Pt verbalized understanding.

## 2014-12-19 NOTE — Progress Notes (Signed)
Quick Note:  I called pt and gave her the results of the C spine MRI as per Dr. Allie Bossier note. Referral made to [place] [facility] for evaluation. Pt verbalized understanding. ______

## 2014-12-22 ENCOUNTER — Other Ambulatory Visit: Payer: Self-pay | Admitting: Medical

## 2014-12-24 ENCOUNTER — Ambulatory Visit: Payer: Self-pay | Admitting: Adult Health

## 2014-12-24 NOTE — Telephone Encounter (Signed)
pls refill the 3 medications.  Make sure she has a f/u appt per my last OV plan

## 2014-12-24 NOTE — Telephone Encounter (Signed)
Is it okay to refill Ambien?

## 2015-01-09 ENCOUNTER — Encounter: Payer: Self-pay | Admitting: Internal Medicine

## 2015-01-09 ENCOUNTER — Ambulatory Visit (INDEPENDENT_AMBULATORY_CARE_PROVIDER_SITE_OTHER): Payer: Medicare Other | Admitting: Internal Medicine

## 2015-01-09 VITALS — BP 132/84 | HR 53 | Ht 63.0 in | Wt 272.0 lb

## 2015-01-09 DIAGNOSIS — I4819 Other persistent atrial fibrillation: Secondary | ICD-10-CM

## 2015-01-09 DIAGNOSIS — I481 Persistent atrial fibrillation: Secondary | ICD-10-CM | POA: Diagnosis not present

## 2015-01-09 LAB — BASIC METABOLIC PANEL
BUN: 23 mg/dL (ref 6–23)
CO2: 33 meq/L — AB (ref 19–32)
Calcium: 9.4 mg/dL (ref 8.4–10.5)
Chloride: 101 mEq/L (ref 96–112)
Creatinine, Ser: 1.08 mg/dL (ref 0.40–1.20)
GFR: 65.99 mL/min (ref >60.00)
GLUCOSE: 148 mg/dL — AB (ref 70–99)
POTASSIUM: 3.9 meq/L (ref 3.5–5.1)
Sodium: 138 mEq/L (ref 135–145)

## 2015-01-09 NOTE — Progress Notes (Signed)
Patient Care Team: Carlena Hurl, PA-C as PCP - General (Family Medicine)   HPI  Stephanie [name] is a 64 y.o. female Seen in followup for atrial fibrillation in the context of morbid obesity sarcoid and mild left ventricular hypertrophy. Catheterization 2012 demonstrated nonobstructive coronary disease and normal left ventricular function and elevated filling pressures By echocardiogram, recurrent cardiomyopathy was demonstratedwith EF 25-30% April 2014  She underwent cardioversion may/14 with the hopes of restoring sinus rhythm and seeing recovery of LV systolic function. She had rapidly reverted to afib and then spontaneously reverted to sinus rhythm. There has been  interval near normalization of LV systolic function confirmed by echo August 2014   She saw Dr. Greggory Brandy 3/15 to consider catheter ablation.  She was not interested. He reduced her amiodarone to 200 mg a day. Encouraged her to refrain from alcohol and to work on blood pressure and weight reduction.  She has been seeing neurology because of complaints of tremor and staggering.  Functional status is relatively stable. She has had no edema  She says that she drinks a little bit of wine or alcohol a couple of weeks.      .   Past Medical History  Diagnosis Date  . Coronary artery disease     no CAD by cath 11.2010  . Diastolic CHF, chronic   . Persistent atrial fibrillation     s/p DCCV 07/2009; Pradaxa Rx  . Dyslipidemia   . Chronic venous hypertension without complications   . Sarcoidosis     dx by eye exam  . Obesity   . Anemia   . Lung nodule     7mm RLL nodule on CT Chest  07/2009  . Irritable bowel syndrome   . Hepatitis, unspecified     hx of  . Depressive disorder, not elsewhere classified   . Colon polyps   . Anemia   . Hypertension   . Glucose intolerance (impaired glucose tolerance)     hgb AIC 6.1 09/02/2009  . Hx of hysterectomy 2002  . Ankle fracture, right     x 2  . Noncompliance     . PONV (postoperative nausea and vomiting)   . Antineoplastic and immunosuppressive drugs causing adverse effect in therapeutic use   . Gout   . Sleep disturbance 06/2013    sleep study, mild abnormality, no criteria for CPAP  . Diverticulosis of colon 2009    colonoscopy  . Colon polyps 2009    Past Surgical History  Procedure Laterality Date  . Cholecystectomy    . Cardioversion      x 2  . Hernia repair  2009  . Abdominal hysterectomy    . Colonoscopy      Diverticulosis, colon polyps, repeat 2014, Dr. Deatra Ina  . Cardioversion N/A 02/27/2013    Procedure: CARDIOVERSION;  Surgeon: Lelon Perla, MD;  Location: [facility];  Service: Cardiovascular;  Laterality: N/A;  . Esophagogastroduodenoscopy N/A 05/16/2014    Procedure: ESOPHAGOGASTRODUODENOSCOPY (EGD);  Surgeon: Wonda Horner, MD;  Location: [facility];  Service: Gastroenterology;  Laterality: N/A;    Current Outpatient Prescriptions  Medication Sig Dispense Refill  . allopurinol (ZYLOPRIM) 300 MG tablet TAKE 1 TABLET BY MOUTH DAILY 30 tablet 0  . ALPRAZolam (XANAX) 0.5 MG tablet TAKE ONE TABLET BY MOUTH AT BEDTIME AS NEEDED FOR ANXIETY 30 tablet 0  . amiodarone (PACERONE) 200 MG tablet Take 1 tablet (200 mg total) by mouth 5 days per week 60 tablet  3  . B Complex-C (B-COMPLEX WITH VITAMIN C) tablet Take 1 tablet by mouth daily. 30 tablet 5  . carvedilol (COREG) 6.25 MG tablet     . cloNIDine (CATAPRES) 0.2 MG tablet Take 1 tablet (0.2 mg total) by mouth 2 (two) times daily. (Patient taking differently: Take 0.2 mg by mouth daily. ) 60 tablet 11  . COLCRYS 0.6 MG tablet Take 0.6 mg by mouth as needed.     . cyanocobalamin 1000 MCG tablet Take 1,000 mcg by mouth daily.     . dabigatran (PRADAXA) 150 MG CAPS capsule Take 1 capsule (150 mg total) by mouth 2 (two) times daily. 60 capsule 11  . FLUoxetine (PROZAC) 20 MG tablet Take 1 tablet (20 mg total) by mouth daily. 30 tablet 2  . furosemide (LASIX) 40 MG tablet Take 1 tablet  (40 mg total) by mouth as needed. 30 tablet 6  . potassium chloride SA (K-DUR,KLOR-CON) 20 MEQ tablet Take 20 mEq by mouth every other day.    . zolpidem (AMBIEN) 10 MG tablet TAKE 1 TABLET BY MOUTH EVERY DAY AT BEDTIME AS NEEDED FOR SLEEP 20 tablet 0  . [DISCONTINUED] propranolol (INDERAL) 60 MG tablet Take 60 mg by mouth daily.       No current facility-administered medications for this visit.    Allergies  Allergen Reactions  . Albuterol     Palpitations, intolerance  . Atorvastatin     Body aches and pains--stiffness.   . Propoxyphene N-Acetaminophen Nausea And Vomiting    Review of Systems negative except from HPI and PMH  Physical Exam BP 132/84 mmHg  Pulse 53  Ht 5\' 3"  (1.6 m)  Wt 272 lb (123.378 kg)  BMI 48.19 kg/m2 Well developed and well nourished in no acute distress HENT normal E scleral and icterus clear Neck Supple JVP flat; carotids brisk and full Clear to ausculation  Regular rate and rhythm, no murmurs gallops or rub Soft with active bowel sounds No clubbing cyanosis none Edema Alert and oriented, grossly normal motor and sensory function Skin Warm and Dry  ECG: Sinus Rhythm  @53             Intervals  18/11/47  Axis 9 LVH NSTT     Assessment and  Plan  Hypertension  Paroxysmal atrial fibrillation on amiodarone  Sarcoid  Obesity   Gait disturbance  Tremor  High risk medication surveillance  Her tremor and staggering is much worse and have to exclude amio neurotoxicity as the cause so will stop it  Will have to see what haoppens to her AF  BP stable

## 2015-01-09 NOTE — Patient Instructions (Signed)
Your physician has recommended you make the following change in your medication:  1) STOP Amiodarone  Lab today: BMET  Your physician recommends that you schedule a follow-up appointment in: 4 months with Dr. [name].

## 2015-01-14 ENCOUNTER — Other Ambulatory Visit: Payer: Self-pay

## 2015-01-14 ENCOUNTER — Telehealth: Payer: Self-pay

## 2015-01-14 MED ORDER — POTASSIUM CHLORIDE CRYS ER 20 MEQ PO TBCR: 20.0000 meq | EXTENDED_RELEASE_TABLET | ORAL | Status: DC

## 2015-01-16 NOTE — Telephone Encounter (Signed)
According to office visit last week on 3/23 patient is taking this every other day. Please call to verify this is how she is taking it.   We can refill for 4 times.

## 2015-01-16 NOTE — Telephone Encounter (Signed)
Message from Stephanie [name] sent at 01/16/2015 4:04 PM EDT -----    Comment: Patient call to request potassium, She states that it was suppose to be refill on March 23, but it wasn't My question is how is she taking this every day or every other day. i saw were it was once a day in 2015, but no were was it changed. Please advise

## 2015-01-17 ENCOUNTER — Other Ambulatory Visit: Payer: Self-pay

## 2015-01-17 MED ORDER — POTASSIUM CHLORIDE CRYS ER 20 MEQ PO TBCR: 20.0000 meq | EXTENDED_RELEASE_TABLET | Freq: Every day | ORAL | Status: DC

## 2015-01-21 ENCOUNTER — Other Ambulatory Visit: Payer: Self-pay | Admitting: Medical

## 2015-01-21 NOTE — Telephone Encounter (Signed)
Is it okay to refill Xanax?

## 2015-01-22 NOTE — Telephone Encounter (Signed)
Call out the 2 meds and make f/u appt at this time

## 2015-01-31 ENCOUNTER — Other Ambulatory Visit: Payer: Self-pay | Admitting: Medical

## 2015-01-31 DIAGNOSIS — M47812 Spondylosis without myelopathy or radiculopathy, cervical region: Secondary | ICD-10-CM | POA: Diagnosis not present

## 2015-01-31 DIAGNOSIS — Z6841 Body Mass Index (BMI) 40.0 and over, adult: Secondary | ICD-10-CM | POA: Diagnosis not present

## 2015-01-31 DIAGNOSIS — I10 Essential (primary) hypertension: Secondary | ICD-10-CM | POA: Diagnosis not present

## 2015-01-31 NOTE — Therapy (Signed)
[facility] [address] [place], [place], [address] Phone: [phone]   Fax:  [phone]  Patient Details  Name: Stephanie [name] MRN: [id number] Date of Birth: 1951-05-16 Referring Provider:  No ref. provider found  Encounter Date: 01/31/2015  PHYSICAL THERAPY DISCHARGE SUMMARY  Visits from Start of Care: 5  Current functional level related to goals / functional outcomes: Long term goals not assessed due to pt not returning for appointments. Progress toward short term goals is indicated below:     PT Short Term Goals - 11/01/14 1357    PT SHORT TERM GOAL #1   Title Demonstrate correct performance of HEP. Target date: 11/02/14   Baseline met on 11/01/2014   Status Achieved   PT SHORT TERM GOAL #2   Title Demonstrate ability to stand on airex foam with eyes closed x 30 seconds without loss of balance or UE support for improved utilization of vestibular system. Target date: 11/02/14   Baseline met on 11/01/2014   Status Achieved   PT SHORT TERM GOAL #3   Title Maintain TUG time <9 seconds without staggering or loss of balance. Target date: 11/02/14   Baseline 9.  sec on 11/01/2014   Status Not Met   PT SHORT TERM GOAL #4   Title Increase FGA score to 24/30 for improving balance. target 11/02/14   Baseline met on 11/01/2014 with score 28/30   PT SHORT TERM GOAL #5   Title Ascend/descend curb and ramp independently to prepare for safe negotiation of hill to access bus stop. Target date: 11/02/14   Baseline met on 11/01/2014   Status Achieved        Remaining deficits: Balance impairment, muscle weakness   Education / Equipment: Home exercise program  Plan: Patient agrees to discharge.  Patient goals were partially met. Patient is being discharged due to not returning since the last visit.  ?????      Delrae Sawyers, PT,DPT,NCS 01/31/2015 2:32 PM Phone [phone] FAX [phone]         [facility] [address] [place], [place], [address] Phone: [phone]   Fax:  [phone]

## 2015-02-18 ENCOUNTER — Other Ambulatory Visit: Payer: Self-pay | Admitting: Medical

## 2015-03-04 ENCOUNTER — Ambulatory Visit (INDEPENDENT_AMBULATORY_CARE_PROVIDER_SITE_OTHER): Payer: Medicare Other | Admitting: Medical

## 2015-03-04 ENCOUNTER — Encounter: Payer: Self-pay | Admitting: Medical

## 2015-03-04 ENCOUNTER — Telehealth: Payer: Self-pay | Admitting: Medical

## 2015-03-04 ENCOUNTER — Other Ambulatory Visit: Payer: Self-pay | Admitting: Medical

## 2015-03-04 VITALS — BP 150/70 | HR 52 | Resp 15 | Wt 269.0 lb

## 2015-03-04 DIAGNOSIS — Z889 Allergy status to unspecified drugs, medicaments and biological substances status: Secondary | ICD-10-CM

## 2015-03-04 DIAGNOSIS — F411 Generalized anxiety disorder: Secondary | ICD-10-CM

## 2015-03-04 DIAGNOSIS — Z789 Other specified health status: Secondary | ICD-10-CM

## 2015-03-04 DIAGNOSIS — G4700 Insomnia, unspecified: Secondary | ICD-10-CM | POA: Diagnosis not present

## 2015-03-04 DIAGNOSIS — D6489 Other specified anemias: Secondary | ICD-10-CM | POA: Diagnosis not present

## 2015-03-04 DIAGNOSIS — E785 Hyperlipidemia, unspecified: Secondary | ICD-10-CM

## 2015-03-04 LAB — CBC
HCT: 34.1 % — ABNORMAL LOW (ref 36.0–46.0)
Hemoglobin: 10.8 g/dL — ABNORMAL LOW (ref 12.0–15.0)
MCH: 29.9 pg (ref 26.0–34.0)
MCHC: 31.7 g/dL (ref 30.0–36.0)
MCV: 94.5 fL (ref 78.0–100.0)
MPV: 10.7 fL (ref 8.6–12.4)
PLATELETS: 218 10*3/uL (ref 150–400)
RBC: 3.61 MIL/uL — AB (ref 3.87–5.11)
RDW: 15.1 % (ref 11.5–15.5)
WBC: 5.1 10*3/uL (ref 4.0–10.5)

## 2015-03-04 MED ORDER — PITAVASTATIN CALCIUM 2 MG PO TABS: 1.0000 | ORAL_TABLET | Freq: Every day | ORAL | Status: DC

## 2015-03-04 MED ORDER — TRAZODONE HCL 50 MG PO TABS: 25.0000 mg | ORAL_TABLET | Freq: Every evening | ORAL | Status: DC | PRN

## 2015-03-04 MED ORDER — FLUOXETINE HCL 20 MG PO TABS: 20.0000 mg | ORAL_TABLET | Freq: Every day | ORAL | Status: DC

## 2015-03-04 NOTE — Telephone Encounter (Signed)
pls call pharmacy to verify she is taking/filling Prozac.  I'm not going to prescribe Xanax if she is not compliant with Prozac.

## 2015-03-04 NOTE — Telephone Encounter (Signed)
Beverlee Nims  Can you run an [place] drug reporting search on this patient?  Thanks
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
## 2015-03-05 ENCOUNTER — Other Ambulatory Visit: Payer: Self-pay | Admitting: Internal Medicine

## 2015-03-05 ENCOUNTER — Other Ambulatory Visit: Payer: Self-pay | Admitting: Medical

## 2015-03-05 DIAGNOSIS — D649 Anemia, unspecified: Secondary | ICD-10-CM

## 2015-03-05 LAB — ETHANOL

## 2015-03-05 MED ORDER — ALPRAZOLAM 0.5 MG PO TABS: 0.5000 mg | ORAL_TABLET | Freq: Every evening | ORAL | Status: DC | PRN

## 2015-03-05 NOTE — Telephone Encounter (Signed)
I called her pharmacy and I check with them on her Prozac and the pharmacists states that she is picking her medication up monthly dates are 11/23/14, 01/04/15, and 02/06/15.

## 2015-03-06 LAB — FERRITIN: Ferritin: 230 ng/mL (ref 10–291)

## 2015-03-13 ENCOUNTER — Telehealth: Payer: Self-pay | Admitting: Medical

## 2015-03-13 ENCOUNTER — Telehealth: Payer: Self-pay | Admitting: Family Medicine

## 2015-03-13 NOTE — Telephone Encounter (Signed)
Please call and find out more about the forms that were dropped off.   What is her request, what are the forms about.    There is a form related to determination of disability.  We don't make a determination of disability at this office.  This is done by the social security office.  If this is in regard to cancellation of loan, does she have documents attesting to being determined to be disabled already?  I will need some proof/document stating this.  If this is in regard to a school loan, when was the loan taken out, how much does she owe?

## 2015-03-13 NOTE — Telephone Encounter (Signed)
Patient stop by the office to drop off some forms that she needs filled out from her physician in reference to her condition. Please, review and then sign.

## 2015-03-14 NOTE — Telephone Encounter (Signed)
Called pt and asked her to call back

## 2015-03-15 NOTE — Telephone Encounter (Signed)
Pt called back stating that the forms were to excuse here for dropping out of classes in school due to heart issues. Pt says she has not been able to walk much due to heart issues. She uses [employer] and she does have Medicare and Medicaid now due due disability determination. She will bring [place] a copy of her documentation to show that she has been classified as disabled. Pt says she has been seeing Dr [name] as well and her medical documentation at his office keeps getting altered. Pt says Dr [name] is aware of this and she has discussed this with Audelia Acton. Pt is aware that Audelia Acton will need the disability proof before the forms will be completed.

## 2015-03-15 NOTE — Telephone Encounter (Signed)
Called pt again. Left message for her to call back so we can get info that [facility] request about the forms she dropped off.

## 2015-03-19 ENCOUNTER — Telehealth: Payer: Self-pay | Admitting: Internal Medicine

## 2015-03-19 NOTE — Telephone Encounter (Signed)
intitiated a prior auth on Livalo

## 2015-03-20 ENCOUNTER — Other Ambulatory Visit: Payer: Self-pay | Admitting: Medical

## 2015-03-20 NOTE — Telephone Encounter (Signed)
P.A. [name] approved til 10/19/15, faxed pharmacy, Left message for pt

## 2015-03-20 NOTE — Telephone Encounter (Signed)
Nothing showed on report

## 2015-04-02 ENCOUNTER — Other Ambulatory Visit: Payer: Self-pay | Admitting: Medical

## 2015-04-02 NOTE — Telephone Encounter (Signed)
Is this okay?

## 2015-04-18 ENCOUNTER — Other Ambulatory Visit: Payer: Self-pay | Admitting: Medical

## 2015-04-18 NOTE — Telephone Encounter (Signed)
Ok to RF? 

## 2015-04-19 ENCOUNTER — Other Ambulatory Visit: Payer: Self-pay | Admitting: Internal Medicine

## 2015-05-03 ENCOUNTER — Other Ambulatory Visit: Payer: Self-pay | Admitting: Internal Medicine

## 2015-05-03 ENCOUNTER — Other Ambulatory Visit: Payer: Self-pay | Admitting: Medical

## 2015-05-03 NOTE — Telephone Encounter (Signed)
IS this okay to refill?

## 2015-05-07 ENCOUNTER — Encounter: Payer: Self-pay | Admitting: Internal Medicine

## 2015-05-07 ENCOUNTER — Ambulatory Visit (INDEPENDENT_AMBULATORY_CARE_PROVIDER_SITE_OTHER): Payer: Medicare Other | Admitting: Internal Medicine

## 2015-05-07 VITALS — BP 138/78 | HR 63 | Ht 63.5 in | Wt 270.0 lb

## 2015-05-07 DIAGNOSIS — I480 Paroxysmal atrial fibrillation: Secondary | ICD-10-CM

## 2015-05-07 NOTE — Progress Notes (Signed)
Patient Care Team: Carlena Hurl, PA-C as PCP - General (Family Medicine)   HPI  Stephanie [name] is a 64 y.o. female Seen in followup for atrial fibrillation in the context of morbid obesity sarcoid and mild left ventricular hypertrophy. Catheterization 2012 demonstrated nonobstructive coronary disease and normal left ventricular function and elevated filling pressures By echocardiogram, recurrent cardiomyopathy was demonstratedwith EF 25-30% April 2014   She underwent cardioversion may/14 with the hopes of restoring sinus rhythm and seeing recovery of LV systolic function. She had rapidly reverted to afib and then spontaneously reverted to sinus rhythm. There has been  interval near normalization of LV systolic function confirmed by echo August 2014   She saw Dr. Greggory Brandy 3/15 to consider catheter ablation.  She was not interested. He reduced her amiodarone to 200 mg a day. Encouraged her to refrain from alcohol and to work on blood pressure and weight reduction.  She has been seeing neurology because of complaints of tremor and staggering.  The concern was whether he could be related to amiodarone  Toxicity and it was discontinued at the last visit.  Functional status is relatively stable. She has had no edema  She says that she drinks a little bit of wine or alcohol a couple of weeks.      .   Past Medical History  Diagnosis Date  . Coronary artery disease     no CAD by cath 11.2010  . Diastolic CHF, chronic   . Persistent atrial fibrillation     s/p DCCV 07/2009; Pradaxa Rx  . Dyslipidemia   . Chronic venous hypertension without complications   . Sarcoidosis     dx by eye exam  . Obesity   . Anemia   . Lung nodule     31mm RLL nodule on CT Chest  07/2009  . Irritable bowel syndrome   . Hepatitis, unspecified     hx of  . Depressive disorder, not elsewhere classified   . Colon polyps   . Anemia   . Hypertension   . Glucose intolerance (impaired glucose  tolerance)     hgb AIC 6.1 09/02/2009  . Hx of hysterectomy 2002  . Ankle fracture, right     x 2  . Noncompliance   . PONV (postoperative nausea and vomiting)   . Antineoplastic and immunosuppressive drugs causing adverse effect in therapeutic use   . Gout   . Sleep disturbance 06/2013    sleep study, mild abnormality, no criteria for CPAP  . Diverticulosis of colon 2009    colonoscopy  . Colon polyps 2009    Past Surgical History  Procedure Laterality Date  . Cholecystectomy    . Cardioversion      x 2  . Hernia repair  2009  . Abdominal hysterectomy    . Colonoscopy      Diverticulosis, colon polyps, repeat 2014, Dr. Deatra Ina  . Cardioversion N/A 02/27/2013    Procedure: CARDIOVERSION;  Surgeon: Lelon Perla, MD;  Location: [facility] ENDOSCOPY;  Service: Cardiovascular;  Laterality: N/A;  . Esophagogastroduodenoscopy N/A 05/16/2014    Procedure: ESOPHAGOGASTRODUODENOSCOPY (EGD);  Surgeon: Wonda Horner, MD;  Location: [facility] ORS;  Service: Gastroenterology;  Laterality: N/A;    Current Outpatient Prescriptions  Medication Sig Dispense Refill  . allopurinol (ZYLOPRIM) 300 MG tablet TAKE 1 TABLET BY MOUTH EVERY DAY 30 tablet 0  . ALPRAZolam (XANAX) 0.5 MG tablet Take 1 tablet (0.5 mg total) by mouth at bedtime as needed for  anxiety. 30 tablet 1  . B Complex-C (B-COMPLEX WITH VITAMIN C) tablet Take 1 tablet by mouth daily. 30 tablet 5  . carvedilol (COREG) 6.25 MG tablet Take 1 tablet (6.25 mg total) by mouth 2 (two) times daily with a meal. 60 tablet 2  . cloNIDine (CATAPRES) 0.2 MG tablet TAKE 1 TABLET BY MOUTH TWICE DAILY 60 tablet 6  . COLCRYS 0.6 MG tablet Take 0.6 mg by mouth daily as needed.     . cyanocobalamin 1000 MCG tablet Take 1,000 mcg by mouth daily.     . dabigatran (PRADAXA) 150 MG CAPS capsule Take 1 capsule (150 mg total) by mouth 2 (two) times daily. 60 capsule 11  . FLUoxetine (PROZAC) 20 MG tablet Take 1 tablet (20 mg total) by mouth daily. 30 tablet 2  .  furosemide (LASIX) 40 MG tablet Take 40 mg by mouth daily as needed for fluid or edema.    . potassium chloride SA (K-DUR,KLOR-CON) 20 MEQ tablet Take 1 tablet (20 mEq total) by mouth daily. 30 tablet 4  . traZODone (DESYREL) 50 MG tablet TAKE 1/2 TO 1 TABLET BY MOUTH EVERY NIGHT AT BEDTIME AS NEEDED FOR SLEEP 90 tablet 0  . [DISCONTINUED] propranolol (INDERAL) 60 MG tablet Take 60 mg by mouth daily.       No current facility-administered medications for this visit.    Allergies  Allergen Reactions  . Albuterol     Palpitations, intolerance  . Atorvastatin     Body aches and pains--stiffness.   [name] [Pitavastatin]     Body aches  . Propoxyphene N-Acetaminophen Nausea And Vomiting    Review of Systems negative except from HPI and PMH  Physical Exam BP 138/78 mmHg  Pulse 63  Ht 5' 3.5" (1.613 m)  Wt 270 lb (122.471 kg)  BMI 47.07 kg/m2 Well developed and well nourished in no acute distress HENT normal E scleral and icterus clear Neck Supple JVP flat; carotids brisk and full Clear to ausculation  Regular rate and rhythm, no murmurs gallops or rub Soft with active bowel sounds No clubbing cyanosis none Edema Alert and oriented, grossly normal motor and sensory function Skin Warm and Dry  ECG: Sinus Rhythm  @53             Intervals  18/11/47  Axis 9 LVH NSTT     Assessment and  Plan  Hypertension  Paroxysmal atrial fibrillation on amiodarone  Sarcoid  Obesity   Gait disturbance  Tremor  High risk medication surveillance  Her tremors are much better off amiodarone. We discussed the potential implications of recurrent atrial fibrillation. She is not interested in switching her clonidine/carvedilol at this time. We will let her symptoms declare.  She insists she is not drinking and is not quite sure why than her medical record

## 2015-05-07 NOTE — Patient Instructions (Signed)
Medication Instructions:  Your physician recommends that you continue on your current medications as directed. Please refer to the Current Medication list given to you today.  Labwork: None ordered  Testing/Procedures: None ordered  Follow-Up: Your physician wants you to follow-up in: 6 months Chanetta Marshall, NP.  You will receive a reminder letter in the mail two months in advance. If you don't receive a letter, please call our office to schedule the follow-up appointment.   Any Other Special Instructions Will Be Listed Below (If Applicable). Thank you for choosing [facility]!!

## 2015-05-23 ENCOUNTER — Other Ambulatory Visit: Payer: Self-pay | Admitting: Medical

## 2015-06-02 ENCOUNTER — Other Ambulatory Visit: Payer: Self-pay | Admitting: Internal Medicine

## 2015-06-07 ENCOUNTER — Telehealth: Payer: Self-pay | Admitting: Medical

## 2015-06-07 NOTE — Telephone Encounter (Signed)
Form completed.

## 2015-06-08 ENCOUNTER — Other Ambulatory Visit: Payer: Self-pay | Admitting: Internal Medicine

## 2015-06-11 ENCOUNTER — Other Ambulatory Visit: Payer: Self-pay | Admitting: Medical

## 2015-06-11 NOTE — Telephone Encounter (Signed)
Is this okay to refill? 

## 2015-07-08 ENCOUNTER — Other Ambulatory Visit: Payer: Self-pay | Admitting: Internal Medicine

## 2015-07-10 ENCOUNTER — Other Ambulatory Visit: Payer: Self-pay | Admitting: Medical

## 2015-07-10 NOTE — Telephone Encounter (Signed)
Is this okay to refill? 

## 2015-07-30 ENCOUNTER — Other Ambulatory Visit: Payer: Self-pay | Admitting: Medical

## 2015-07-30 ENCOUNTER — Telehealth: Payer: Self-pay | Admitting: Medical

## 2015-07-30 NOTE — Telephone Encounter (Signed)
Trazodone sent out per request from pharmacy, but lets set her up a physical visit.   Double check but I think its been a year or more since last physical

## 2015-07-30 NOTE — Telephone Encounter (Signed)
Scheduled for Friday the 14th of October.

## 2015-07-30 NOTE — Telephone Encounter (Signed)
Is this okay to refill? 

## 2015-08-02 ENCOUNTER — Encounter: Payer: Self-pay | Admitting: Medical

## 2015-08-02 ENCOUNTER — Ambulatory Visit (INDEPENDENT_AMBULATORY_CARE_PROVIDER_SITE_OTHER): Payer: Medicare Other | Admitting: Medical

## 2015-08-02 VITALS — BP 120/80 | HR 68 | Temp 98.0°F | Resp 16 | Ht 63.5 in | Wt 273.0 lb

## 2015-08-02 DIAGNOSIS — Z0000 Encounter for general adult medical examination without abnormal findings: Secondary | ICD-10-CM | POA: Diagnosis not present

## 2015-08-02 DIAGNOSIS — R7989 Other specified abnormal findings of blood chemistry: Secondary | ICD-10-CM | POA: Insufficient documentation

## 2015-08-02 DIAGNOSIS — M1040 Other secondary gout, unspecified site: Secondary | ICD-10-CM

## 2015-08-02 DIAGNOSIS — E669 Obesity, unspecified: Secondary | ICD-10-CM | POA: Insufficient documentation

## 2015-08-02 DIAGNOSIS — Z23 Encounter for immunization: Secondary | ICD-10-CM | POA: Diagnosis not present

## 2015-08-02 DIAGNOSIS — M109 Gout, unspecified: Secondary | ICD-10-CM | POA: Insufficient documentation

## 2015-08-02 DIAGNOSIS — F411 Generalized anxiety disorder: Secondary | ICD-10-CM | POA: Diagnosis not present

## 2015-08-02 DIAGNOSIS — Z1239 Encounter for other screening for malignant neoplasm of breast: Secondary | ICD-10-CM

## 2015-08-02 DIAGNOSIS — R102 Pelvic and perineal pain: Secondary | ICD-10-CM

## 2015-08-02 DIAGNOSIS — I4891 Unspecified atrial fibrillation: Secondary | ICD-10-CM

## 2015-08-02 DIAGNOSIS — R946 Abnormal results of thyroid function studies: Secondary | ICD-10-CM

## 2015-08-02 DIAGNOSIS — I10 Essential (primary) hypertension: Secondary | ICD-10-CM

## 2015-08-02 DIAGNOSIS — R270 Ataxia, unspecified: Secondary | ICD-10-CM

## 2015-08-02 DIAGNOSIS — M1A40X0 Other secondary chronic gout, unspecified site, without tophus (tophi): Secondary | ICD-10-CM

## 2015-08-02 DIAGNOSIS — Z862 Personal history of diseases of the blood and blood-forming organs and certain disorders involving the immune mechanism: Secondary | ICD-10-CM | POA: Diagnosis not present

## 2015-08-02 DIAGNOSIS — R911 Solitary pulmonary nodule: Secondary | ICD-10-CM

## 2015-08-02 DIAGNOSIS — F341 Dysthymic disorder: Secondary | ICD-10-CM | POA: Insufficient documentation

## 2015-08-02 DIAGNOSIS — E785 Hyperlipidemia, unspecified: Secondary | ICD-10-CM | POA: Insufficient documentation

## 2015-08-02 DIAGNOSIS — I5032 Chronic diastolic (congestive) heart failure: Secondary | ICD-10-CM

## 2015-08-02 DIAGNOSIS — R300 Dysuria: Secondary | ICD-10-CM | POA: Diagnosis not present

## 2015-08-02 DIAGNOSIS — I429 Cardiomyopathy, unspecified: Secondary | ICD-10-CM

## 2015-08-02 DIAGNOSIS — R7301 Impaired fasting glucose: Secondary | ICD-10-CM | POA: Diagnosis not present

## 2015-08-02 DIAGNOSIS — D538 Other specified nutritional anemias: Secondary | ICD-10-CM | POA: Insufficient documentation

## 2015-08-02 DIAGNOSIS — R309 Painful micturition, unspecified: Secondary | ICD-10-CM | POA: Diagnosis not present

## 2015-08-02 LAB — COMPREHENSIVE METABOLIC PANEL
ALBUMIN: 4 g/dL (ref 3.6–5.1)
ALK PHOS: 92 U/L (ref 33–130)
ALT: 8 U/L (ref 6–29)
AST: 11 U/L (ref 10–35)
BUN: 19 mg/dL (ref 7–25)
CALCIUM: 9.3 mg/dL (ref 8.6–10.4)
CO2: 27 mmol/L (ref 20–31)
Chloride: 97 mmol/L — ABNORMAL LOW (ref 98–110)
Creat: 1.06 mg/dL — ABNORMAL HIGH (ref 0.50–0.99)
Glucose, Bld: 133 mg/dL — ABNORMAL HIGH (ref 65–99)
Potassium: 3.8 mmol/L (ref 3.5–5.3)
Sodium: 138 mmol/L (ref 135–146)
Total Bilirubin: 0.6 mg/dL (ref 0.2–1.2)
Total Protein: 7.4 g/dL (ref 6.1–8.1)

## 2015-08-02 LAB — CBC
HCT: 34 % — ABNORMAL LOW (ref 36.0–46.0)
HEMOGLOBIN: 10.6 g/dL — AB (ref 12.0–15.0)
MCH: 29.7 pg (ref 26.0–34.0)
MCHC: 31.2 g/dL (ref 30.0–36.0)
MCV: 95.2 fL (ref 78.0–100.0)
MPV: 11.6 fL (ref 8.6–12.4)
PLATELETS: 226 10*3/uL (ref 150–400)
RBC: 3.57 MIL/uL — ABNORMAL LOW (ref 3.87–5.11)
RDW: 14.6 % (ref 11.5–15.5)
WBC: 7.3 10*3/uL (ref 4.0–10.5)

## 2015-08-02 LAB — POCT URINALYSIS DIPSTICK
BILIRUBIN UA: NEGATIVE
Glucose, UA: NEGATIVE
KETONES UA: NEGATIVE
Leukocytes, UA: NEGATIVE
NITRITE UA: NEGATIVE
RBC UA: NEGATIVE
Spec Grav, UA: 1.025
Urobilinogen, UA: NEGATIVE
pH, UA: 5.5

## 2015-08-02 LAB — LIPID PANEL
Cholesterol: 257 mg/dL — ABNORMAL HIGH (ref 125–200)
HDL: 84 mg/dL (ref >=46)
LDL CALC: 158 mg/dL — AB (ref <130)
TRIGLYCERIDES: 75 mg/dL (ref <150)
Total CHOL/HDL Ratio: 3.1 Ratio (ref <=5.0)
VLDL: 15 mg/dL (ref <30)

## 2015-08-02 LAB — T4, FREE: Free T4: 1.29 ng/dL (ref 0.80–1.80)

## 2015-08-02 LAB — HEMOGLOBIN A1C
HEMOGLOBIN A1C: 6.6 % — AB (ref <5.7)
Mean Plasma Glucose: 143 mg/dL — ABNORMAL HIGH (ref <117)

## 2015-08-02 LAB — TSH: TSH: 4.69 u[IU]/mL — ABNORMAL HIGH (ref 0.350–4.500)

## 2015-08-02 LAB — URIC ACID: Uric Acid, Serum: 5.6 mg/dL (ref 2.4–7.0)

## 2015-08-02 NOTE — Progress Notes (Signed)
Stephanie [name] is a 64 y.o. female who presents for Medicare Annual Wellness Visit.  Date of last medicare wellness visit was ? unsure.  Names of Other Physician/Practitioners you currently use: [name], PA-C here for primary care Dr [name], cardiologist Dr. Larey Seat, neurology Dr. Deatra Ina, GI  Medical Services you may have received from other than [facility] providers in the past year (date may be approximate) Cardiology, neurology  History reviewed: allergies, current medications, past family history, past medical history, past social history, past surgical history and problem list  Risk Factors: Tobacco female does not smoke.    Alcohol Current alcohol use: none  Caffeine Current caffeine use: limited  Exercise Current exercise habits: The patient does not participate in regular exercise at present.   Nutrition/Diet Current diet: can really give specifics  Depression Screen Nurse depression screen reviewed.  Activities of Daily Living Nurse ADLs screen reviewed.   Vision Difficulties: No  Hearing Difficulties: No Do you often ask people to speak up or repeat themselves? No Do you experience ringing or noises in your ears? No Do you have difficulty understanding soft or whispered voices? No  Cognition  Do you feel that you have a problem with memory? No  Do you often misplace items? No  Do you feel safe at home?  Yes  Advanced directives Does patient have a [place]? No Does patient have a Living Will? No   Preventative care:done here 2015 Last ophthalmology visit:years ago Last dental visit:2011, had to pay cash so hasn't been back Last colonoscopy:age 55 [place] Last gynecological exam: unsure Last NTI:RWERXV Last labs:2015  Prior vaccinations: TD or Tdap:2014 Influenza: would like today Pneumococcal:2011 Shingles/Zostavax:not yet   Concerns: Having some urinary pain and  pressure for the last month, lower abdominal pain.   Thinks its a UTI.   Has some burning.  Denies bleeding, no urine odor.  Denies new sexual contacts, just the same partner she's had.  No vaginal discharge.    She is compliant with her blood pressure medications  Still has trouble getting and staying a sleep.   Wants refill on xanax she uses for sleep and anxiety.  Sees cardiology regularly for hx/o CAD, chronic heart failure  She notes that she is exercising some, but she can never really elba borate on diet.  The assumption based on her recollection is that she is not eating healthy  Denies any recent problems with gait, imbalance.   She reports fullness/fluid in breast, no distinct nodules or skin changes,no discharge.  Reviewed their medical, surgical, family, social, medication, and allergy history and updated chart as appropriate.  Past Medical History  Diagnosis Date  . Coronary artery disease     no CAD by cath 11.2010  . Diastolic CHF, chronic ([place])   . Persistent atrial fibrillation ([place])     s/p DCCV 07/2009; Pradaxa Rx  . Dyslipidemia   . Chronic venous hypertension without complications   . Sarcoidosis ([place])     dx by eye exam  . Obesity   . Lung nodule     83mm RLL nodule on CT Chest  07/2009, resolved by 2011 CT  . Irritable bowel syndrome   . Hepatitis, unspecified     hx of  . Depressive disorder, not elsewhere classified   . Colon polyps   . Hypertension   . Glucose intolerance (impaired glucose tolerance)     hgb AIC 6.1 09/02/2009  . Hx of  hysterectomy 2002  . Ankle fracture, right     x 2  . Noncompliance   . PONV (postoperative nausea and vomiting)   . Antineoplastic and immunosuppressive drugs causing adverse effect in therapeutic use   . Gout   . Sleep disturbance 06/2013    sleep study, mild abnormality, no criteria for CPAP  . Diverticulosis of colon 2009    colonoscopy  . Colon polyps 2009  . Anemia     Past Surgical History  Procedure  Laterality Date  . Cholecystectomy    . Cardioversion      x 2  . Hernia repair  2009  . Abdominal hysterectomy    . Colonoscopy      Diverticulosis, colon polyps, repeat 2014, Dr. Deatra Ina  . Cardioversion N/A 02/27/2013    Procedure: CARDIOVERSION;  Surgeon: Lelon Perla, MD;  Location[facility] ENDOSCOPY;  Service: Cardiovascular;  Laterality: N/A;  . Esophagogastroduodenoscopy N/A 05/16/2014    Procedure: ESOPHAGOGASTRODUODENOSCOPY (EGD);  Surgeon: Wonda Horner, MD;  Location: [facility] ORS;  Service: Gastroenterology;  Laterality: N/A;    Social History   Social History  . Marital Status: Single    Spouse Name: N/A  . Number of Children: 5  . Years of Education: college   Occupational History  . Not on file.   Social History Main Topics  . Smoking status: Never Smoker   . Smokeless tobacco: Never Used  . Alcohol Use: 0.0 oz/week    0 Standard drinks or equivalent per week     Comment: drinks 1 glass of wine every 2 weeks on average (occasional)  . Drug Use: No  . Sexual Activity: Not Currently    Birth Control/ Protection: Surgical   Other Topics Concern  . Not on file   Social History Narrative   Pt lives in [place] with her son.  Unemployed.  Right handed.     Education college       Family History  Problem Relation Age of Onset  . Pneumonia Father     deceased  . Hypertension Father   . Cancer Mother     deceased  . Multiple sclerosis Mother     hx  . Emphysema Other     runs in the family     Current outpatient prescriptions:  .  B Complex-C (B-COMPLEX WITH VITAMIN C) tablet, Take 1 tablet by mouth daily., Disp: 30 tablet, Rfl: 5 .  carvedilol (COREG) 6.25 MG tablet, TAKE 1 TABLET(6.25 MG) BY MOUTH TWICE DAILY WITH A MEAL, Disp: 180 tablet, Rfl: 1 .  cloNIDine (CATAPRES) 0.2 MG tablet, TAKE 1 TABLET BY MOUTH TWICE DAILY, Disp: 60 tablet, Rfl: 6 .  COLCRYS 0.6 MG tablet, Take 0.6 mg by mouth daily as needed. , Disp: , Rfl:  .  cyanocobalamin 1000 MCG  tablet, Take 1,000 mcg by mouth daily. , Disp: , Rfl:  .  dabigatran (PRADAXA) 150 MG CAPS capsule, Take 1 capsule (150 mg total) by mouth 2 (two) times daily., Disp: 60 capsule, Rfl: 11 .  FLUoxetine (PROZAC) 20 MG tablet, TAKE 1 TABLET BY MOUTH DAILY, Disp: 90 tablet, Rfl: 0 .  FLUoxetine (PROZAC) 20 MG tablet, TAKE 1 TABLET BY MOUTH EVERY DAY, Disp: 30 tablet, Rfl: 0 .  furosemide (LASIX) 40 MG tablet, Take 40 mg by mouth daily as needed for fluid or edema., Disp: , Rfl:  .  furosemide (LASIX) 40 MG tablet, Take 1 tablet (40 mg total) by mouth daily as needed for fluid or  edema., Disp: 30 tablet, Rfl: 3 .  potassium chloride SA (K-DUR,KLOR-CON) 20 MEQ tablet, Take 1 tablet (20 mEq total) by mouth daily., Disp: 30 tablet, Rfl: 4 .  potassium chloride SA (K-DUR,KLOR-CON) 20 MEQ tablet, TAKE 1 TABLET(20 MEQ) BY MOUTH EVERY OTHER DAY, Disp: 30 tablet, Rfl: 10 .  traZODone (DESYREL) 50 MG tablet, TAKE 1/2 TO 1 TABLET BY MOUTH EVERY NIGHT AT BEDTIME AS NEEDED FOR SLEEP, Disp: 90 tablet, Rfl: 0 .  allopurinol (ZYLOPRIM) 300 MG tablet, TAKE 1 TABLET BY MOUTH EVERY DAY (Patient not taking: Reported on 08/02/2015), Disp: 90 tablet, Rfl: 0 .  ALPRAZolam (XANAX) 0.5 MG tablet, Take 1 tablet (0.5 mg total) by mouth at bedtime as needed for anxiety. (Patient not taking: Reported on 08/02/2015), Disp: 30 tablet, Rfl: 1 .  [DISCONTINUED] propranolol (INDERAL) 60 MG tablet, Take 60 mg by mouth daily.  , Disp: , Rfl:   Allergies  Allergen Reactions  . Albuterol     Palpitations, intolerance  . Atorvastatin     Body aches and pains--stiffness.   [name] [Pitavastatin]     Body aches  . Propoxyphene N-Acetaminophen Nausea And Vomiting   Review of Systems Constitutional: -fever, -chills, -sweats, -unexpected weight change, -decreased appetite, -fatigue Allergy: -sneezing, -itching, -congestion Dermatology: -changing moles, --rash, -lumps ENT: -runny nose, -ear pain, -sore throat, -hoarseness, -sinus pain,  -teeth pain, - ringing in ears, -hearing loss, -nosebleeds Cardiology: -chest pain, -palpitations, -swelling, -difficulty breathing when lying flat, -waking up short of breath Respiratory: -cough, -shortness of breath, -difficulty breathing with exercise or exertion, -wheezing, -coughing up blood Gastroenterology: -abdominal pain, -nausea, -vomiting, -diarrhea, -constipation, -blood in stool, -changes in bowel movement, -difficulty swallowing or eating Hematology: -bleeding, -bruising  Musculoskeletal: -joint aches, -muscle aches, -joint swelling, -back pain, -neck pain, -cramping, -changes in gait Ophthalmology: denies vision changes, eye redness, itching, discharge Urology: -burning with urination, -difficulty urinating, -blood in urine, +urinary frequency, -urgency, -incontinence Neurology: -headache, -weakness, -tingling, -numbness, -memory loss, -falls, -dizziness Psychology: -depressed mood, -agitation, +sleep problems     Objective:   Physical Exam  BP 120/80 mmHg  Pulse 68  Temp(Src) 98 F (36.7 C) (Oral)  Resp 16  Ht 5' 3.5" (1.613 m)  Wt 273 lb (123.832 kg)  BMI 47.60 kg/m2  Wt Readings from Last 3 Encounters:  08/02/15 273 lb (123.832 kg)  05/07/15 270 lb (122.471 kg)  03/04/15 269 lb (122.018 kg)   General appearance: alert, no distress, WD/WN, obese [name] Skin: few scattered macules, no worrisome lesions HEENT: normocephalic, conjunctiva/corneas normal, sclerae anicteric, PERRLA, EOMi, nares patent, no discharge or erythema, pharynx normal Oral cavity: MMM, tongue normal, teeth with significant plaque, some decay Neck: supple, no lymphadenopathy, no thyromegaly, no masses, normal ROM, no bruits Chest: non tender, normal shape and expansion Heart: RRR, normal S1, S2, no murmurs Lungs: CTA bilaterally, no wheezes, rhonchi, or rales Abdomen: +bs, soft, large RUQ surgical scar diagonally, non tender, non distended, no masses, no hepatomegaly, no splenomegaly, no  bruits Back: non tender, normal ROM, no scoliosis Musculoskeletal: upper extremities non tender, no obvious deformity, normal ROM throughout, lower extremities non tender, no obvious deformity, normal ROM throughout Extremities: no edema, no cyanosis, no clubbing Pulses: 2+ symmetric, upper and lower extremities, normal cap refill Neurological: alert, oriented x 3, CN2-12 intact, strength normal upper extremities and lower extremities, sensation normal throughout, DTRs 2+ throughout, no cerebellar signs, gait normal Psychiatric: normal affect, behavior normal, pleasant  Breast: nontender, no masses or lumps, no skin changes, no  nipple discharge or inversion, no axillary lymphadenopathy Gyn: Normal external genitalia without lesions, vagina with normal mucosa, s/p hysterectomy, no abnormal vaginal discharge.  There is left adnexal tenderness, no masses.  Swabs taken.  Exam chaperoned by nurse. Rectal: deferred   Assessment and Plan :    Encounter Diagnoses  Name Primary?  . Physical exam Yes  . Needs flu shot   . History of anemia   . Other specified nutritional anemias   . Urinary pain   . Obesity   . Dyslipidemia   . Impaired fasting blood sugar   . Ataxia   . Cardiomyopathy, secondary ([place])   . Atrial fibrillation, unspecified type ([place])   . Generalized anxiety disorder   . Dysthymic disorder   . History of sarcoidosis   . Other secondary chronic gout   . Abnormal thyroid blood test   . CHRONIC DIASTOLIC HEART FAILURE   . Lung nodule   . Essential hypertension   . Screening for breast cancer   . Need for prophylactic vaccination and inoculation against influenza     Physical exam - discussed healthy lifestyle, diet, exercise, preventative care, vaccinations, and addressed their concerns.  Handout given.  Counseled on the influenza virus vaccine.  Vaccine information sheet given.  Influenza vaccine given after consent obtained. Hx/o anemia - labs today Urinary pain - UA  normal.   Will send urine for culture and GC/Chlamydia testing, consider pelvic ultrasound Obesity - needs to work on lifestyle changes to lose weight hyperlipidemia with statin intolerance, f/u pending labs Impaired fasting glucose - f/u pending labs Ataxia - no c/o today.   She saw neurology earlier this year.  I reviewed those records and MRI brain and C spine. Cardiomyopathy, chronic afib, chronic diastolic heart failure - compliant with medications, reviewed most recent cardiology notes GAD and dysthymia - c/t Prozac, alprazolam prn Hx/o sarcoidosis  chornic gout - uric acid level today, c/t allopurinol Prior abnormal thyroid labs - repeat labs today Lung nodule - looked back in 2011 CT and the prior nodule had resolved. No further eval at this time. She needs to go for updated mammogram  During the course of the visit the patient was educated and counseled about appropriate screening and preventive services including:    Influenza vaccine  Screening mammography  Colorectal cancer screening  Diabetes screening  Nutrition counseling   Advanced directives: has NO advanced directive - not interested in additional information  Screening recommendations, referrals: Mammogram  Recommended yearly ophthalmology/optometry visit for glaucoma screening and checkup Recommended yearly dental visit for hygiene and checkup Advanced directives - recommended she do this  Medicare Attestation I have personally reviewed: The patient's medical and social history Their use of alcohol, tobacco or illicit drugs Their current medications and supplements The patient's functional ability including ADLs,fall risks, home safety risks, cognitive, and hearing and visual impairment Diet and physical activities Evidence for depression or mood disorders  The patient's weight, height, BMI, and visual acuity have been recorded in the chart.  I have made referrals, counseling, and provided education to  the patient based on review of the above and I have provided the patient with a written personalized care plan for preventive services.     [name], PA-C   08/03/2015

## 2015-08-03 ENCOUNTER — Encounter: Payer: Self-pay | Admitting: Medical

## 2015-08-03 LAB — GC/CHLAMYDIA PROBE AMP
CT PROBE, AMP APTIMA: NEGATIVE
GC PROBE AMP APTIMA: NEGATIVE

## 2015-08-03 LAB — MICROALBUMIN / CREATININE URINE RATIO
CREATININE, URINE: 193.8 mg/dL
MICROALB UR: 5.3 mg/dL — AB (ref <2.0)
MICROALB/CREAT RATIO: 27.3 mg/g (ref 0.0–30.0)

## 2015-08-03 LAB — URINE CULTURE
COLONY COUNT: NO GROWTH
ORGANISM ID, BACTERIA: NO GROWTH

## 2015-08-03 NOTE — Patient Instructions (Signed)
  Thank you for giving me the opportunity to serve you today.    Your diagnosis today includes: Encounter Diagnoses  Name Primary?  . Physical exam Yes  . Needs flu shot   . History of anemia   . Other specified nutritional anemias   . Urinary pain   . Obesity   . Dyslipidemia   . Impaired fasting blood sugar   . Ataxia   . Cardiomyopathy, secondary ([place])   . Atrial fibrillation, unspecified type ([place])   . Generalized anxiety disorder   . Dysthymic disorder   . History of sarcoidosis   . Other secondary chronic gout   . Abnormal thyroid blood test   . CHRONIC DIASTOLIC HEART FAILURE   . Lung nodule   . Essential hypertension   . Screening for breast cancer   . Need for prophylactic vaccination and inoculation against influenza     Recommendations:  I recommend you complete Advanced Directives which includes living will and health care power of attorney.  This can be done from forms we have here or through an attorney or possibly credit union or other organizations  Go for a mammogram  We will call with lab results  If your lab results are normal, we will probably recommend a pelvic ultrasound given your pelvic pain  I recommend you work on losing weigh through careful diet and getting exercise such as walking.  If agreeable, we can refer you to a nutritionist See your eye doctor yearly for routine vision care. See your dentist yearly for routine dental care including hygiene visits twice yearly. See your cardiologist as usual. Take your medications as usual

## 2015-08-05 MED ORDER — ALPRAZOLAM 0.5 MG PO TABS: 0.5000 mg | ORAL_TABLET | Freq: Every evening | ORAL | Status: DC | PRN

## 2015-08-05 NOTE — Addendum Note (Signed)
Addended by: Billie Lade on: 08/05/2015 04:47 PM   Modules accepted: Orders, SmartSet

## 2015-08-05 NOTE — Addendum Note (Signed)
Addended by: Billie Lade on: 08/05/2015 04:30 PM   Modules accepted: Orders, SmartSet

## 2015-08-06 ENCOUNTER — Telehealth: Payer: Self-pay

## 2015-08-06 ENCOUNTER — Other Ambulatory Visit: Payer: Self-pay | Admitting: Medical

## 2015-08-06 DIAGNOSIS — Z1239 Encounter for other screening for malignant neoplasm of breast: Secondary | ICD-10-CM | POA: Insufficient documentation

## 2015-08-06 DIAGNOSIS — R102 Pelvic and perineal pain: Secondary | ICD-10-CM

## 2015-08-06 NOTE — Telephone Encounter (Signed)
Informed pt but she doesn't seem to want to go to the UC or ED. She can not go to U/S today she doesn't have a car.

## 2015-08-06 NOTE — Telephone Encounter (Signed)
If she is in severe pain, then she may need to go to urgent care or the ED.  Can we get her ultrasound today (assuming its still lower abdomen).

## 2015-08-06 NOTE — Addendum Note (Signed)
Addended by: Carlena Hurl on: 08/06/2015 07:40 AM   Modules accepted: Orders

## 2015-08-06 NOTE — Telephone Encounter (Signed)
Pt said she has not been able to take any of her cardiac medications because it is hurting her stomach so bad. She wants to know if you can call her in pain medicaiton. Wants to know if she can take advil, said she was in so much pain and so weak she wasn't even able to call an ambulance.

## 2015-08-08 ENCOUNTER — Ambulatory Visit
Admission: RE | Admit: 2015-08-08 | Discharge: 2015-08-08 | Disposition: A | Discharge status: Home / Self Care | Payer: Medicare Other | Source: Ambulatory Visit | Attending: Medical | Admitting: Medical

## 2015-08-08 DIAGNOSIS — R102 Pelvic and perineal pain: Secondary | ICD-10-CM

## 2015-08-08 DIAGNOSIS — R309 Painful micturition, unspecified: Secondary | ICD-10-CM

## 2015-08-19 ENCOUNTER — Other Ambulatory Visit: Payer: Self-pay | Admitting: Medical

## 2015-09-15 ENCOUNTER — Other Ambulatory Visit: Payer: Self-pay | Admitting: Internal Medicine

## 2015-09-23 ENCOUNTER — Encounter: Payer: Medicare Other | Attending: Medical | Admitting: *Deleted

## 2015-09-23 ENCOUNTER — Encounter: Payer: Self-pay | Admitting: *Deleted

## 2015-09-23 DIAGNOSIS — D649 Anemia, unspecified: Secondary | ICD-10-CM | POA: Diagnosis not present

## 2015-09-23 DIAGNOSIS — Z862 Personal history of diseases of the blood and blood-forming organs and certain disorders involving the immune mechanism: Secondary | ICD-10-CM

## 2015-09-23 DIAGNOSIS — E785 Hyperlipidemia, unspecified: Secondary | ICD-10-CM

## 2015-09-23 DIAGNOSIS — E669 Obesity, unspecified: Secondary | ICD-10-CM | POA: Insufficient documentation

## 2015-09-23 NOTE — Patient Instructions (Signed)
Try to consume 3 meals a day Try to consume more fiber whole grains (brown rice, whole grain breads, whole grain pastas, etc) Try to walk for 10 minutes 3 days a week Try to turn off the TV when eating

## 2015-09-23 NOTE — Progress Notes (Signed)
  Medical Nutrition Therapy:  Appt start time: [id number] end time:  1130.   Assessment:  Primary concerns today: Patient was referred for obesity and anemia but states she was unsure why. Per medical record patient has hyperlipidemia and HgA1c of 6.6. She does the grocery shopping and she perpares the meals. She typical bakes or stews chicken. Has a son that lives with her. Fast food about once a week. Typically eats in the kitchen, she watches tv during the meal. Per patient she eats "medium to slow" about 10-15 minutes to eat.   She has vegetables about 2-3 times a week, cabage, corn, green beans, etc.   Per patient if she doesn't "eat right" she sweats a lot, if she drinks soda she stays up all night. She hasn't been sleeping well, about 3-4 hours. Patient reports poor energy level   Preferred Learning Style:   No preference indicated   Learning Readiness:   Ready   MEDICATIONS: See List    DIETARY INTAKE:  Usual eating pattern includes 1 meals and none snacks per day.  Avoided foods include no pork, steak.    24-hr recall:  B ( AM): none  Snk ( AM): none  L ( PM): Ham and eggs, stew chicken with vegetables, oddles and noodles Snk ( PM): none D ( PM): none Snk ( PM): none Beverages: Ginger ale, fluids 8 bottles of water, soda 1 glass or juice   Usual physical activity: "not very well" 10-15 minutes exercise in the house, stretching etc   Estimated energy needs: 1800 calories 200 g carbohydrates 135 g protein 50 g fat   Nutritional Diagnosis:  NB-1.1 Food and nutrition-related knowledge deficit As related to proper balance of carbohydrates, fats, and protein.  As evidenced by dietary recall.    Intervention:  Nutrition counseling proved. Discussed metabolic effects of meal skipping, discouraged this practice. Discussed my plate recommendation for meal planning and focusing on increasing whole grains and vegetables. Discuss how improved nutrition can improve physical and  medical symptoms.  Goals:   Try to consume 3 meals a day Try to consume more fiber whole grains (brown rice, whole grain breads, whole grain pastas, etc) Try to walk for 10 minutes 3 days a week Try to turn off the TV when eating  Teaching Method Utilized:  Visual Auditory   Handouts given during visit include:  My plate   Barriers to learning/adherence to lifestyle change: Lack of transportation, anxiety  Demonstrated degree of understanding via:  Teach Back   Monitoring/Evaluation:  Dietary intake, exercise, labs, and body weight prn.

## 2015-10-03 ENCOUNTER — Ambulatory Visit (INDEPENDENT_AMBULATORY_CARE_PROVIDER_SITE_OTHER): Payer: Medicare Other | Admitting: Medical

## 2015-10-03 ENCOUNTER — Telehealth: Payer: Self-pay | Admitting: Medical

## 2015-10-03 ENCOUNTER — Encounter: Payer: Self-pay | Admitting: Medical

## 2015-10-03 VITALS — BP 122/90 | HR 56 | Temp 97.8°F | Resp 12 | Wt 277.0 lb

## 2015-10-03 DIAGNOSIS — J988 Other specified respiratory disorders: Secondary | ICD-10-CM | POA: Diagnosis not present

## 2015-10-03 DIAGNOSIS — R52 Pain, unspecified: Secondary | ICD-10-CM | POA: Diagnosis not present

## 2015-10-03 DIAGNOSIS — R509 Fever, unspecified: Secondary | ICD-10-CM | POA: Diagnosis not present

## 2015-10-03 DIAGNOSIS — R05 Cough: Secondary | ICD-10-CM

## 2015-10-03 DIAGNOSIS — R059 Cough, unspecified: Secondary | ICD-10-CM

## 2015-10-03 MED ORDER — HYDROCODONE-HOMATROPINE 5-1.5 MG/5ML PO SYRP: 5.0000 mL | ORAL_SOLUTION | Freq: Three times a day (TID) | ORAL | Status: DC | PRN

## 2015-10-03 MED ORDER — AZITHROMYCIN 250 MG PO TABS: ORAL_TABLET | ORAL | Status: DC

## 2015-10-03 NOTE — Progress Notes (Signed)
Subjective: Chief Complaint  Patient presents with  . flu?    started as a itch in the throat. getting chills and having sweats. coughing so much she cant sleep. started this monday.   Here for illness.  Started 4 days ago.  Started with itch in throat, sweats, chills, achy, dizzy, weak, cold feeling.  Has had lots of cough particular last night.  No SOB but has wheezing.   Has some nausea, but no vomiting.   Felt feverish last few days.  Using nothing for symptoms.   No sick contacts. No other aggravating or relieving factors. No other complaint.   Past Medical History  Diagnosis Date  . Coronary artery disease     no CAD by cath 11.2010  . Diastolic CHF, chronic ([place])   . Persistent atrial fibrillation ([place])     s/p DCCV 07/2009; Pradaxa Rx  . Dyslipidemia   . Chronic venous hypertension without complications   . Sarcoidosis ([place])     dx by eye exam  . Obesity   . Lung nodule     23mm RLL nodule on CT Chest  07/2009, resolved by 2011 CT  . Irritable bowel syndrome   . Hepatitis, unspecified     hx of  . Depressive disorder, not elsewhere classified   . Colon polyps   . Hypertension   . Glucose intolerance (impaired glucose tolerance)     hgb AIC 6.1 09/02/2009  . Hx of hysterectomy 2002  . Ankle fracture, right     x 2  . Noncompliance   . PONV (postoperative nausea and vomiting)   . Antineoplastic and immunosuppressive drugs causing adverse effect in therapeutic use   . Gout   . Sleep disturbance 06/2013    sleep study, mild abnormality, no criteria for CPAP  . Diverticulosis of colon 2009    colonoscopy  . Colon polyps 2009  . Anemia   . Asthma   . Hyperlipidemia    ROS as in subjective  Objective: BP 122/90 mmHg  Pulse 56  Temp(Src) 97.8 F (36.6 C) (Oral)  Resp 12  Wt 277 lb (125.646 kg)  General appearance: Alert, WD/WN, no distress, ill appearing                             Skin: warm, no rash, no diaphoresis                           Head: mild  frontal sinus tenderness                            Eyes: conjunctiva normal, corneas clear, PERRLA                            Ears: pearly TMs, external ear canals normal                          Nose: septum midline, turbinates swollen, with erythema and no discharge             Mouth/throat: MMM, tongue normal, no pharyngeal erythema                           Neck: supple, no adenopathy, no thyromegaly, non tender  Heart: RRR, normal S1, S2, no murmurs                         Lungs: +bronchial breath sounds, no rhonchi, no wheezes, no rales                Extremities: no edema, non tender  Assessment: Encounter Diagnoses  Name Primary?  . Fever and chills Yes  . Cough   . Body aches      Plan: Flu swab negative.  Begin zpak, hycodan syrup prn, rest, hydrate well, and if not much improved within 3-4 days or worse, then return.

## 2015-10-03 NOTE — Telephone Encounter (Signed)
[facility] called to get clarification or authorization to changed directions on Azithromycin 250mg  to 2 tablet day 1, then 1 tablet for days 2-5 since this is normally for a 5 day corse and 6 tablets were sent in. Veronica and I looked over this and authorized the change

## 2015-10-15 ENCOUNTER — Other Ambulatory Visit: Payer: Self-pay | Admitting: Medical

## 2015-10-15 NOTE — Telephone Encounter (Signed)
Is this ok to refill?  

## 2015-10-28 ENCOUNTER — Other Ambulatory Visit: Payer: Self-pay | Admitting: Medical

## 2015-10-28 NOTE — Telephone Encounter (Signed)
Is this ok to refill?  

## 2015-11-10 NOTE — Progress Notes (Signed)
Cardiology Office Note Date:  11/11/2015  Patient ID:  Stephanie [name] Jul 27, 1951, MRN [id number] PCP:  [name], PA-C  Electrophysiologist: Dr. [name] Neuology: Dr. Brett Fairy Gastroenterology: Dr. Deatra Ina   Chief Complaint: planned 6 month f/u with EP  History of Present Illness: Stephanie [name] is a 65 y.o. female with history of PAFib, sarcoidosis (eye), IBS, HLD, No obst CAD by cath in 2010.  She comes today for a previously planned 6 month visit.  She is getting over a cold.  Mentions that she hasn't been taking her medicines exactly as she is supposed to, says she hasn't been eating all that well and some days only eating once a day and doesn't like to take her pills without eating.  She denies any particular reason for her dietary change, no GI symptoms, just hasn't been eating as usual.  No N/V, or pain.  No CP or SOB, infrequent skipped/extra beats, she has not felt like she has had any AF.  Her gait disturbance and tremor is resolved off the amiodarone.  No exertional intolerances, no symptoms of PND or orthopnea, she has not noticed and edema.  Outside of the recent URI,  And what she describes as occassional feeling "hot flash" that she says is why her PMD presricbed the Prozac for, she has been feeling pretty well.  Her AF history: --DCCV 2010, May 2014 with raid return of AF then sponatenous conversion to SR --March 2015 discussed with Dr. Rayann Heman possible ablation but did not want to persue this, her amio was reduced to 200mg  daily and recommended lifestyle adjustments --Amiodarone ultimately stopped with concerns of possible toxicity with neuro symptoms of staggering/tremor with improvement off -- CM felt to be AF/rate related 25% IN 2012, that is improved to 50-55% in 2014 -- Mild abnormal sleep study no indication for CPAP 2014   Past Medical History  Diagnosis Date  . Coronary artery disease     no CAD by cath 11.2010  . Diastolic CHF, chronic ([place])   .  Persistent atrial fibrillation ([place])     s/p DCCV 07/2009; Pradaxa Rx  . Dyslipidemia   . Chronic venous hypertension without complications   . Sarcoidosis ([place])     dx by eye exam  . Obesity   . Lung nodule     43mm RLL nodule on CT Chest  07/2009, resolved by 2011 CT  . Irritable bowel syndrome   . Hepatitis, unspecified     hx of  . Depressive disorder, not elsewhere classified   . Colon polyps   . Hypertension   . Glucose intolerance (impaired glucose tolerance)     hgb AIC 6.1 09/02/2009  . Hx of hysterectomy 2002  . Ankle fracture, right     x 2  . Noncompliance   . PONV (postoperative nausea and vomiting)   . Antineoplastic and immunosuppressive drugs causing adverse effect in therapeutic use   . Gout   . Sleep disturbance 06/2013    sleep study, mild abnormality, no criteria for CPAP  . Diverticulosis of colon 2009    colonoscopy  . Colon polyps 2009  . Anemia   . Asthma   . Hyperlipidemia     Past Surgical History  Procedure Laterality Date  . Cholecystectomy    . Cardioversion      x 2  . Hernia repair  2009  . Abdominal hysterectomy    . Colonoscopy      Diverticulosis, colon polyps, repeat 2014, Dr. Deatra Ina  .  Cardioversion N/A 02/27/2013    Procedure: CARDIOVERSION;  Surgeon: Lelon Perla, MD;  Location: [facility] ENDOSCOPY;  Service: Cardiovascular;  Laterality: N/A;  . Esophagogastroduodenoscopy N/A 05/16/2014    Procedure: ESOPHAGOGASTRODUODENOSCOPY (EGD);  Surgeon: Wonda Horner, MD;  Location: [facility] ORS;  Service: Gastroenterology;  Laterality: N/A;    Current Outpatient Prescriptions  Medication Sig Dispense Refill  . allopurinol (ZYLOPRIM) 300 MG tablet TAKE 1 TABLET BY MOUTH EVERY DAY 90 tablet 0  . ALPRAZolam (XANAX) 0.5 MG tablet Take 1 tablet (0.5 mg total) by mouth at bedtime as needed for anxiety. 30 tablet 0  . B Complex-C (B-COMPLEX WITH VITAMIN C) tablet Take 1 tablet by mouth daily. 30 tablet 5  . carvedilol (COREG) 6.25 MG tablet TAKE 1  TABLET(6.25 MG) BY MOUTH TWICE DAILY WITH A MEAL 180 tablet 1  . cloNIDine (CATAPRES) 0.2 MG tablet TAKE 1 TABLET BY MOUTH TWICE DAILY 60 tablet 6  . COLCRYS 0.6 MG tablet Take 0.6 mg by mouth daily as needed. Reported on 10/03/2015    . cyanocobalamin 1000 MCG tablet Take 1,000 mcg by mouth daily.     Marland Kitchen FLUoxetine (PROZAC) 20 MG tablet TAKE 1 TABLET BY MOUTH DAILY 90 tablet 0  . furosemide (LASIX) 40 MG tablet Take 40 mg by mouth daily as needed for fluid or edema.    Marland Kitchen HYDROcodone-homatropine (HYCODAN) 5-1.5 MG/5ML syrup Take 5 mLs by mouth every 8 (eight) hours as needed for cough. 120 mL 0  . potassium chloride SA (K-DUR,KLOR-CON) 20 MEQ tablet Take 1 tablet (20 mEq total) by mouth daily. 30 tablet 4  . PRADAXA 150 MG CAPS capsule TAKE 1 CAPSULE BY MOUTH TWICE DAILY( EVERY TWELVE HOURS) 60 capsule 2  . traZODone (DESYREL) 50 MG tablet TAKE 1/2 TO 1 TABLET BY MOUTH EVERY NIGHT AT BEDTIME AS NEEDED FOR SLEEP 90 tablet 0  . [DISCONTINUED] propranolol (INDERAL) 60 MG tablet Take 60 mg by mouth daily.       No current facility-administered medications for this visit.    Allergies:   Albuterol; Atorvastatin; Livalo; and Propoxyphene n-acetaminophen   Social History:  The patient  reports that she has never smoked. She has never used smokeless tobacco. She reports that she drinks alcohol. She reports that she does not use illicit drugs.   Family History:  The patient's family history includes Cancer in her father; Emphysema in her other; Hypertension in her father; Multiple sclerosis in her mother; Pneumonia in her father.  ROS:  Please see the history of present illness.   All other systems are reviewed and otherwise negative.   PHYSICAL EXAM:  VS:  BP 142/98 mmHg  Pulse 68  Ht 5' 3.5" (1.613 m)  Wt 266 lb 12.8 oz (121.02 kg)  BMI 46.51 kg/m2 BMI: Body mass index is 46.51 kg/(m^2). Very plesant BF, obese, in no acute distress HEENT: normocephalic, atraumatic Neck: no JVD, carotid bruits  or masses Cardiac:  normal S1, S2; RRR; extrasystoles,  no significant murmurs, no rubs, or gallops Lungs:  clear to auscultation bilaterally, no wheezing, rhonchi or rales Abd: soft, nontender MS: no deformity or atrophy Ext: no edema Skin: warm and dry, no rash Neuro:  No gross deficits appreciated Psych: euthymic mood, full affect    EKG:  Done today shows SR, she has APCs/VPC's, possibly PJC's  05/29/13: Echocardiogram Study Conclusions Left ventricle: LVEF is approximately 50 to 55% with very mild inferior and septal hypokinesis  02/08/13: Echocardiogram Study Conclusions - Left ventricle: The cavity  size was mildly dilated. Wall thickness was increased in a pattern of moderate LVH. Systolic function was severely reduced. The estimated ejection fraction was in the range of 25% to 30%. - Left atrium: The atrium was mildly dilated.  Recent Labs: 08/02/2015: ALT 8; BUN 19; Creat 1.06*; Hemoglobin 10.6*; Platelets 226; Potassium 3.8; Sodium 138; TSH 4.690*  08/02/2015: Cholesterol 257*; HDL 84; LDL Cholesterol 158*; Total CHOL/HDL Ratio 3.1; Triglycerides 75; VLDL 15   CrCl cannot be calculated (Patient has no serum creatinine result on file.).   Wt Readings from Last 3 Encounters:  11/11/15 266 lb 12.8 oz (121.02 kg)  10/03/15 277 lb (125.646 kg)  08/02/15 273 lb (123.832 kg)     Other studies reviewed: Additional studies/records reviewed today include: summarized above  ASSESSMENT AND PLAN:  1. PAFib     CHADS2Vasc is at least 3 on Pradaxa, counseled on the importance of taking routinely and as directed     Denies any bleeding or signs of bleeding     08/02/15 Creat 1.06 (calc Cr. CL = 107.75), Hgb 10.6 appears stable at last check     Today is in SR     At her last visit with Dr. [name] she was reluctant to adjust her medicine regime     Off amiodarone with concerns of toxicity with neurological symptoms that are  Resolved off it  2. HTN     Her BP is high  here today, she is counseled on taking her medicines daily and asked to monitor her BP at home  3. Tremor, gait discturbance     resolved off amio    c/w neurology  4. Hx of CM    Improved at her last echo, no symptoms of CHF      Discussed with the patient the importance of healthy eating habits and taking her medicines as prescribed.  Disposition: We will draw for BMET, mag level, CBC and TSH today, f/u with her PMD as directed by her, f/u with Dr. Belva Chimes in 6 months, sooner if needed.  We will go ahead and update her echo doppler given her hx of CM.  Current medicines are reviewed at length with the patient today.  The patient did not have any concerns regarding medicines.  Haywood Lasso, PA-C 11/11/2015 1:52 PM     [address] [address] [phone] (office)  [phone] (fax)

## 2015-11-11 ENCOUNTER — Encounter: Payer: Self-pay | Admitting: Physician Assistant

## 2015-11-11 ENCOUNTER — Encounter (INDEPENDENT_AMBULATORY_CARE_PROVIDER_SITE_OTHER): Payer: Self-pay

## 2015-11-11 ENCOUNTER — Ambulatory Visit (INDEPENDENT_AMBULATORY_CARE_PROVIDER_SITE_OTHER): Payer: Medicare Other | Admitting: Physician Assistant

## 2015-11-11 VITALS — BP 142/98 | HR 68 | Ht 63.5 in | Wt 266.8 lb

## 2015-11-11 DIAGNOSIS — I10 Essential (primary) hypertension: Secondary | ICD-10-CM

## 2015-11-11 DIAGNOSIS — I420 Dilated cardiomyopathy: Secondary | ICD-10-CM | POA: Diagnosis not present

## 2015-11-11 DIAGNOSIS — I480 Paroxysmal atrial fibrillation: Secondary | ICD-10-CM | POA: Diagnosis not present

## 2015-11-11 DIAGNOSIS — I482 Chronic atrial fibrillation: Secondary | ICD-10-CM

## 2015-11-11 DIAGNOSIS — I4891 Unspecified atrial fibrillation: Secondary | ICD-10-CM | POA: Diagnosis not present

## 2015-11-11 LAB — CBC WITH DIFFERENTIAL/PLATELET
BASOS ABS: 0 10*3/uL (ref 0.0–0.1)
Basophils Relative: 0 % (ref 0–1)
EOS ABS: 0.1 10*3/uL (ref 0.0–0.7)
Eosinophils Relative: 1 % (ref 0–5)
HEMATOCRIT: 36.7 % (ref 36.0–46.0)
Hemoglobin: 11.6 g/dL — ABNORMAL LOW (ref 12.0–15.0)
LYMPHS ABS: 3.4 10*3/uL (ref 0.7–4.0)
LYMPHS PCT: 49 % — AB (ref 12–46)
MCH: 29.7 pg (ref 26.0–34.0)
MCHC: 31.6 g/dL (ref 30.0–36.0)
MCV: 94.1 fL (ref 78.0–100.0)
MONOS PCT: 7 % (ref 3–12)
MPV: 11.7 fL (ref 8.6–12.4)
Monocytes Absolute: 0.5 10*3/uL (ref 0.1–1.0)
NEUTROS ABS: 3 10*3/uL (ref 1.7–7.7)
NEUTROS PCT: 43 % (ref 43–77)
Platelets: 251 10*3/uL (ref 150–400)
RBC: 3.9 MIL/uL (ref 3.87–5.11)
RDW: 15 % (ref 11.5–15.5)
WBC: 7 10*3/uL (ref 4.0–10.5)

## 2015-11-11 LAB — BASIC METABOLIC PANEL
BUN: 17 mg/dL (ref 7–25)
CHLORIDE: 100 mmol/L (ref 98–110)
CO2: 28 mmol/L (ref 20–31)
CREATININE: 1.07 mg/dL — AB (ref 0.50–0.99)
Calcium: 9.5 mg/dL (ref 8.6–10.4)
Glucose, Bld: 151 mg/dL — ABNORMAL HIGH (ref 65–99)
POTASSIUM: 3.7 mmol/L (ref 3.5–5.3)
Sodium: 141 mmol/L (ref 135–146)

## 2015-11-11 LAB — MAGNESIUM: Magnesium: 1.7 mg/dL (ref 1.5–2.5)

## 2015-11-11 MED ORDER — CLONIDINE HCL 0.2 MG PO TABS: 0.2000 mg | ORAL_TABLET | Freq: Two times a day (BID) | ORAL | Status: DC

## 2015-11-11 NOTE — Patient Instructions (Addendum)
Medication Instructions:  Your physician recommends that you continue on your current medications as directed. Please refer to the Current Medication list given to you today.   Labwork: Bmet, Cbc, Tsh, Mag  Testing/Procedures: None ordered  Follow-Up: Your physician wants you to follow-up in: 6 months with Dr.[place] You will receive a reminder letter in the mail two months in advance. If you don't receive a letter, please call our office to schedule the follow-up appointment.   Any Other Special Instructions Will Be Listed Below (If Applicable).     If you need a refill on your cardiac medications before your next appointment, please call your pharmacy.

## 2015-11-11 NOTE — Addendum Note (Signed)
Addended by: Lamar Laundry on: 11/11/2015 02:22 PM   Modules accepted: Orders

## 2015-11-12 LAB — TSH: TSH: 3.253 u[IU]/mL (ref 0.350–4.500)

## 2015-11-14 ENCOUNTER — Telehealth: Payer: Self-pay | Admitting: *Deleted

## 2015-11-14 NOTE — Telephone Encounter (Signed)
-----   Message from [facility], [place] sent at 11/12/2015  3:07 PM EST ----- Please let the patient know her labs look stable.  No changes to her medicines.  Thanks [employer]

## 2015-11-15 ENCOUNTER — Telehealth: Payer: Self-pay | Admitting: *Deleted

## 2015-11-15 NOTE — Telephone Encounter (Signed)
-----   Message from [facility], [place] sent at 11/12/2015  3:07 PM EST ----- Please let the patient know her labs look stable.  No changes to her medicines.  Thanks [employer]

## 2015-11-18 ENCOUNTER — Other Ambulatory Visit: Payer: Self-pay | Admitting: Internal Medicine

## 2015-11-18 ENCOUNTER — Other Ambulatory Visit: Payer: Self-pay | Admitting: Medical

## 2015-11-19 ENCOUNTER — Encounter: Payer: Self-pay | Admitting: *Deleted

## 2015-11-21 ENCOUNTER — Ambulatory Visit (HOSPITAL_COMMUNITY): Payer: Medicare Other | Attending: Physician Assistant

## 2015-11-21 ENCOUNTER — Other Ambulatory Visit: Payer: Self-pay

## 2015-11-21 DIAGNOSIS — Z6841 Body Mass Index (BMI) 40.0 and over, adult: Secondary | ICD-10-CM | POA: Diagnosis not present

## 2015-11-21 DIAGNOSIS — I480 Paroxysmal atrial fibrillation: Secondary | ICD-10-CM

## 2015-11-21 DIAGNOSIS — I420 Dilated cardiomyopathy: Secondary | ICD-10-CM | POA: Diagnosis not present

## 2015-11-21 DIAGNOSIS — I517 Cardiomegaly: Secondary | ICD-10-CM | POA: Insufficient documentation

## 2015-11-21 DIAGNOSIS — I429 Cardiomyopathy, unspecified: Secondary | ICD-10-CM | POA: Diagnosis present

## 2015-11-21 DIAGNOSIS — I10 Essential (primary) hypertension: Secondary | ICD-10-CM | POA: Insufficient documentation

## 2015-11-21 DIAGNOSIS — E785 Hyperlipidemia, unspecified: Secondary | ICD-10-CM | POA: Diagnosis not present

## 2015-11-22 ENCOUNTER — Telehealth: Payer: Self-pay | Admitting: *Deleted

## 2015-11-22 NOTE — Telephone Encounter (Signed)
-----   Message from [facility], [place] sent at 11/21/2015  7:36 PM EST ----- Please let her know her that her heart muscle strength remains improved.  Thanks [employer]

## 2015-11-25 ENCOUNTER — Telehealth: Payer: Self-pay | Admitting: Physician Assistant

## 2015-11-25 NOTE — Telephone Encounter (Signed)
Spoke with the patient, she had read that there were potential side effects or perhaps people with atrial fibrillation should not be on Pradaxa,  Possibly just misunderstood the medication insert.  We discussed her indication for anticoagulation and she states understanding. Stated she appreciated the call back, denies any bleeding or signs of bleeding.    Tommye Standard, PA-C

## 2015-12-15 ENCOUNTER — Other Ambulatory Visit: Payer: Self-pay | Admitting: Internal Medicine

## 2015-12-16 ENCOUNTER — Other Ambulatory Visit: Payer: Self-pay | Admitting: *Deleted

## 2015-12-16 MED ORDER — DABIGATRAN ETEXILATE MESYLATE 150 MG PO CAPS: 150.0000 mg | ORAL_CAPSULE | Freq: Two times a day (BID) | ORAL | Status: DC

## 2016-01-13 ENCOUNTER — Other Ambulatory Visit: Payer: Self-pay | Admitting: Medical

## 2016-01-13 NOTE — Telephone Encounter (Signed)
Is this ok to refill?  

## 2016-01-21 ENCOUNTER — Ambulatory Visit (INDEPENDENT_AMBULATORY_CARE_PROVIDER_SITE_OTHER): Payer: Medicare Other | Admitting: Medical

## 2016-01-21 ENCOUNTER — Encounter: Payer: Self-pay | Admitting: Medical

## 2016-01-21 VITALS — BP 150/90 | HR 52 | Wt 269.0 lb

## 2016-01-21 DIAGNOSIS — I5032 Chronic diastolic (congestive) heart failure: Secondary | ICD-10-CM

## 2016-01-21 DIAGNOSIS — R7301 Impaired fasting glucose: Secondary | ICD-10-CM | POA: Diagnosis not present

## 2016-01-21 DIAGNOSIS — I10 Essential (primary) hypertension: Secondary | ICD-10-CM

## 2016-01-21 DIAGNOSIS — F411 Generalized anxiety disorder: Secondary | ICD-10-CM

## 2016-01-21 DIAGNOSIS — M1A0790 Idiopathic chronic gout, unspecified ankle and foot, without tophus (tophi): Secondary | ICD-10-CM | POA: Diagnosis not present

## 2016-01-21 DIAGNOSIS — G4700 Insomnia, unspecified: Secondary | ICD-10-CM

## 2016-01-21 DIAGNOSIS — E785 Hyperlipidemia, unspecified: Secondary | ICD-10-CM | POA: Diagnosis not present

## 2016-01-21 DIAGNOSIS — Z862 Personal history of diseases of the blood and blood-forming organs and certain disorders involving the immune mechanism: Secondary | ICD-10-CM

## 2016-01-21 DIAGNOSIS — Z1239 Encounter for other screening for malignant neoplasm of breast: Secondary | ICD-10-CM

## 2016-01-21 LAB — CBC WITH DIFFERENTIAL/PLATELET
BASOS PCT: 1 %
Basophils Absolute: 59 cells/uL (ref 0–200)
EOS ABS: 118 {cells}/uL (ref 15–500)
Eosinophils Relative: 2 %
HEMATOCRIT: 33 % — AB (ref 35.0–45.0)
HEMOGLOBIN: 10.6 g/dL — AB (ref 11.7–15.5)
LYMPHS ABS: 2596 {cells}/uL (ref 850–3900)
Lymphocytes Relative: 44 %
MCH: 31.2 pg (ref 27.0–33.0)
MCHC: 32.1 g/dL (ref 32.0–36.0)
MCV: 97.1 fL (ref 80.0–100.0)
MONO ABS: 472 {cells}/uL (ref 200–950)
MONOS PCT: 8 %
MPV: 10.8 fL (ref 7.5–12.5)
NEUTROS ABS: 2655 {cells}/uL (ref 1500–7800)
Neutrophils Relative %: 45 %
Platelets: 210 10*3/uL (ref 140–400)
RBC: 3.4 MIL/uL — ABNORMAL LOW (ref 3.80–5.10)
RDW: 14.8 % (ref 11.0–15.0)
WBC: 5.9 10*3/uL (ref 4.0–10.5)

## 2016-01-21 LAB — HEMOGLOBIN A1C
Hgb A1c MFr Bld: 7 % — ABNORMAL HIGH (ref <5.7)
MEAN PLASMA GLUCOSE: 154 mg/dL

## 2016-01-21 MED ORDER — FLUOXETINE HCL 20 MG PO TABS: 20.0000 mg | ORAL_TABLET | Freq: Every day | ORAL | Status: DC

## 2016-01-21 MED ORDER — COLCHICINE 0.6 MG PO TABS: 0.6000 mg | ORAL_TABLET | Freq: Every day | ORAL | Status: DC | PRN

## 2016-01-21 MED ORDER — ALPRAZOLAM 0.5 MG PO TABS: 0.5000 mg | ORAL_TABLET | Freq: Every evening | ORAL | Status: DC | PRN

## 2016-01-21 NOTE — Progress Notes (Signed)
Subjective: Chief Complaint  Patient presents with  . Referral    mentioned for the dentist, told pt she does not need that. stated about an eye doctor referral. her insurance doesnt require referrals for that either.   Here for several concerns.  Has questions on whether she needs referrals for eye doctor, dentist, and mammogram.  Dyslipidemia - Not currently taking statin, they make her feel funny.    Insomnia, anxiety - Wants refill on Xanax, uses for sleep, anxiety.     Gout - Wants refill on Coclyrs.  Takes Allopurinol 300mg  daily, had flare up few weeks ago.  Impaired fasting sugar - not checking sugar.   Fasting today  HTN - compliant with medication   Past Medical History  Diagnosis Date  . Coronary artery disease     no CAD by cath 11.2010  . Diastolic CHF, chronic ([place])   . Persistent atrial fibrillation ([place])     s/p DCCV 07/2009; Pradaxa Rx  . Dyslipidemia   . Chronic venous hypertension without complications   . Sarcoidosis ([place])     dx by eye exam  . Obesity   . Lung nodule     54mm RLL nodule on CT Chest  07/2009, resolved by 2011 CT  . Irritable bowel syndrome   . Hepatitis, unspecified     hx of  . Depressive disorder, not elsewhere classified   . Colon polyps   . Hypertension   . Glucose intolerance (impaired glucose tolerance)     hgb AIC 6.1 09/02/2009  . Hx of hysterectomy 2002  . Ankle fracture, right     x 2  . Noncompliance   . PONV (postoperative nausea and vomiting)   . Antineoplastic and immunosuppressive drugs causing adverse effect in therapeutic use   . Gout   . Sleep disturbance 06/2013    sleep study, mild abnormality, no criteria for CPAP  . Diverticulosis of colon 2009    colonoscopy  . Colon polyps 2009  . Anemia   . Asthma   . Hyperlipidemia    ROS as in subjective   Objective: BP 150/90 mmHg  Pulse 52  Wt 269 lb (122.018 kg)  General appearance: alert, no distress, WD/WN Neck: supple, no lymphadenopathy, no  thyromegaly, no masses Heart: RRR, normal S1, S2, no murmurs Lungs: CTA bilaterally, no wheezes, rhonchi, or rales Abdomen: +bs, soft, non tender, non distended, no masses, no hepatomegaly, no splenomegaly Pulses: 2+ symmetric, upper and lower extremities, normal cap refill Ext: no edema Psych: pleasant, good eye contact, more cheerful than usual    Assessment Encounter Diagnoses  Name Primary?  . Generalized anxiety disorder Yes  . Impaired fasting blood sugar   . Dyslipidemia   . Screening for breast cancer   . Essential hypertension   . CHRONIC DIASTOLIC HEART FAILURE   . Insomnia   . Idiopathic chronic gout of foot without tophus, unspecified laterality      Plan: Of note, the visit was somewhat rushed.  Her appt time scheduled was 30 minutes different than what she states she was told on the phone.   She got here way early, and I was running late, which made her feel that she was here forever.  She also uses public transportation/[employer] bus.   Advised she can make her own appt for dentist, eye doctor, mammogram at her conveniences.  Gave her contact info for mammogram.  Impaired glucose - labs today  Dyslipidemia - prior intolerance to several lipid lowering medication.  F/u pending labs  HTN, chronic heart failure - elevated today, managed by cardiology, and advised f/u with them  Insomnia, anxiety - seems to be doing ok currently with mood, anxiety.   Refilled xanax for prn use.  C/t Prozac.   Aerin was seen today for referral.  Diagnoses and all orders for this visit:  Generalized anxiety disorder  Impaired fasting blood sugar -     Hemoglobin A1c -     Comprehensive metabolic panel -     Lipid panel -     CBC with Differential/Platelet -     Iron and TIBC  Dyslipidemia -     Lipid panel -     CBC with Differential/Platelet -     Iron and TIBC  Screening for breast cancer  Essential hypertension -     Lipid panel -     CBC with  Differential/Platelet -     Iron and TIBC  CHRONIC DIASTOLIC HEART FAILURE  Insomnia  Idiopathic chronic gout of foot without tophus, unspecified laterality  Other orders -     ALPRAZolam (XANAX) 0.5 MG tablet; Take 1 tablet (0.5 mg total) by mouth at bedtime as needed for anxiety. -     colchicine (COLCRYS) 0.6 MG tablet; Take 1 tablet (0.6 mg total) by mouth daily as needed. Reported on 01/21/2016 -     FLUoxetine (PROZAC) 20 MG tablet; Take 1 tablet (20 mg total) by mouth daily.

## 2016-01-22 LAB — COMPREHENSIVE METABOLIC PANEL
ALBUMIN: 3.9 g/dL (ref 3.6–5.1)
ALK PHOS: 83 U/L (ref 33–130)
ALT: 6 U/L (ref 6–29)
AST: 10 U/L (ref 10–35)
BILIRUBIN TOTAL: 0.4 mg/dL (ref 0.2–1.2)
BUN: 20 mg/dL (ref 7–25)
CO2: 28 mmol/L (ref 20–31)
CREATININE: 0.97 mg/dL (ref 0.50–0.99)
Calcium: 9.4 mg/dL (ref 8.6–10.4)
Chloride: 104 mmol/L (ref 98–110)
Glucose, Bld: 164 mg/dL — ABNORMAL HIGH (ref 65–99)
Potassium: 4.7 mmol/L (ref 3.5–5.3)
SODIUM: 140 mmol/L (ref 135–146)
TOTAL PROTEIN: 6.8 g/dL (ref 6.1–8.1)

## 2016-01-22 LAB — IRON AND TIBC
%SAT: 20 % (ref 11–50)
IRON: 63 ug/dL (ref 45–160)
TIBC: 310 ug/dL (ref 250–450)
UIBC: 247 ug/dL (ref 125–400)

## 2016-01-22 LAB — LIPID PANEL
CHOLESTEROL: 239 mg/dL — AB (ref 125–200)
HDL: 64 mg/dL (ref >=46)
LDL Cholesterol: 161 mg/dL — ABNORMAL HIGH (ref <130)
TRIGLYCERIDES: 72 mg/dL (ref <150)
Total CHOL/HDL Ratio: 3.7 Ratio (ref <=5.0)
VLDL: 14 mg/dL (ref <30)

## 2016-01-23 ENCOUNTER — Other Ambulatory Visit: Payer: Self-pay | Admitting: Medical

## 2016-01-23 ENCOUNTER — Telehealth: Payer: Self-pay | Admitting: Medical

## 2016-01-23 MED ORDER — TRAZODONE HCL 50 MG PO TABS: ORAL_TABLET | ORAL | Status: DC

## 2016-01-23 MED ORDER — SIMVASTATIN 10 MG PO TABS: 10.0000 mg | ORAL_TABLET | Freq: Every day | ORAL | Status: DC

## 2016-01-23 MED ORDER — METFORMIN HCL 500 MG PO TABS: 500.0000 mg | ORAL_TABLET | Freq: Two times a day (BID) | ORAL | Status: DC

## 2016-01-23 NOTE — Telephone Encounter (Signed)
Pt returned call to [place] and would like a call back

## 2016-01-24 ENCOUNTER — Telehealth: Payer: Self-pay

## 2016-01-24 ENCOUNTER — Encounter: Payer: Self-pay | Admitting: Gastroenterology

## 2016-01-24 DIAGNOSIS — Z1211 Encounter for screening for malignant neoplasm of colon: Secondary | ICD-10-CM

## 2016-01-24 DIAGNOSIS — D649 Anemia, unspecified: Secondary | ICD-10-CM

## 2016-01-24 NOTE — Telephone Encounter (Signed)
-----   Message from Stephanie Hurl, PA-C sent at 01/23/2016  5:12 AM EDT ----- Labs show cholesterol too high, and she now has lab findings consistent with diabetes.  She has some chronic anemia.  Rest of labs ok.   At this point I want her to start Metformin 500 mg oral tablet once daily for a week, then BID for diabetes. Once she is tolerating this for 2 weeks, then begin Simvastatin 10 mg low dose cholesterol medication, but start it every other day for 2 weeks.  If she is tolerating this after 2 weeks, then go to daily .  This is taken at bedtime. Continue al other medications as usual She is due back at this time to see Dr. Leanora Ivanoff for repeat colonoscopy in light of the anemia, so please get her back in with Dr.Kaplan at [place].  Schedule her a recheck in 77mo, fasting

## 2016-01-24 NOTE — Telephone Encounter (Signed)
Referral to GI  

## 2016-03-24 ENCOUNTER — Encounter: Payer: Self-pay | Admitting: Gastroenterology

## 2016-03-24 ENCOUNTER — Other Ambulatory Visit (INDEPENDENT_AMBULATORY_CARE_PROVIDER_SITE_OTHER): Payer: Medicare Other

## 2016-03-24 ENCOUNTER — Ambulatory Visit (INDEPENDENT_AMBULATORY_CARE_PROVIDER_SITE_OTHER): Payer: Medicare Other | Admitting: Gastroenterology

## 2016-03-24 VITALS — BP 134/80 | HR 60 | Ht 63.5 in | Wt 267.1 lb

## 2016-03-24 DIAGNOSIS — R140 Abdominal distension (gaseous): Secondary | ICD-10-CM

## 2016-03-24 DIAGNOSIS — D62 Acute posthemorrhagic anemia: Secondary | ICD-10-CM | POA: Diagnosis not present

## 2016-03-24 DIAGNOSIS — D649 Anemia, unspecified: Secondary | ICD-10-CM | POA: Diagnosis not present

## 2016-03-24 DIAGNOSIS — K5909 Other constipation: Secondary | ICD-10-CM

## 2016-03-24 DIAGNOSIS — Z1211 Encounter for screening for malignant neoplasm of colon: Secondary | ICD-10-CM

## 2016-03-24 LAB — VITAMIN B12: VITAMIN B 12: 502 pg/mL (ref 211–911)

## 2016-03-24 LAB — FOLATE: FOLATE: 15 ng/mL (ref >5.9)

## 2016-03-24 MED ORDER — POLYETHYLENE GLYCOL 3350 17 GM/SCOOP PO POWD: ORAL | Status: DC

## 2016-03-24 NOTE — Patient Instructions (Signed)
We have sent the following medications to your pharmacy for you to pick up at your convenience:Miralax .  Your physician has requested that you go to the basement for the following lab work before leaving today:Vitamin B12, Folate.  You have been given a low fodmap diet to follow.  You will be due for a recall colonoscopy in 12/2017. We will send you a reminder in the mail when it gets closer to that time.

## 2016-03-24 NOTE — Progress Notes (Signed)
HPI :  65 y/o female here today to discuss colon cancer screening and other symptoms bothering her. She is a new patient to our clinic, she takes Pradaxa for a history of atrial fibrillation and history of CHF.   She had a colonoscopy in 2009 as outlined below, no adenomas. She reports some constipation / bloating which can bother her longstanding. She reported severe abdominal cramps and thought she had a UTI,  took some old amoxicillin she had at home which she thinks helped her symptoms which have since resolved. She has a bowel movement roughly once per day but recently is going less frequency now. She has some straining to produce stools. She has ongoing bloating. She thinks often after she drinks or eats it can become worse. If she has a bowel movement it will take away her bloating. No blood in the stools. She reports dark green stools. She denies any FH of colon cancer. She thinks her weight is stable. She has had fairly recently started metformin.  She does not take anything for her bowels at present time. She states she drinks coffee to help stimulate a bowel movement.   She has a longstanding anemia, normocytic, dating back to 2009 to 2010, with normal iron stores. Hgb baseline around 10.  Endoscopic history: EGD 05/16/14 - normal without pathology to explain symptoms of possible food impaction Colonoscopy 12/26/07 - 2mm benign descending colon polyp - benign no adenoma, diverticulosis  Past Medical History  Diagnosis Date  . Coronary artery disease     no CAD by cath 11.2010  . Diastolic CHF, chronic ([place])   . Persistent atrial fibrillation ([place])     s/p DCCV 07/2009; Pradaxa Rx  . Dyslipidemia   . Chronic venous hypertension without complications   . Sarcoidosis ([place])     dx by eye exam  . Obesity   . Lung nodule     3mm RLL nodule on CT Chest  07/2009, resolved by 2011 CT  . Irritable bowel syndrome   . Hepatitis, unspecified     hx of  . Depressive disorder, not elsewhere  classified   . Colon polyps   . Hypertension   . Glucose intolerance (impaired glucose tolerance)     hgb AIC 6.1 09/02/2009  . Hx of hysterectomy 2002  . Ankle fracture, right     x 2  . Noncompliance   . PONV (postoperative nausea and vomiting)   . Antineoplastic and immunosuppressive drugs causing adverse effect in therapeutic use   . Gout   . Sleep disturbance 06/2013    sleep study, mild abnormality, no criteria for CPAP  . Diverticulosis of colon 2009    colonoscopy  . Colon polyps 2009  . Anemia   . Asthma   . Hyperlipidemia      Past Surgical History  Procedure Laterality Date  . Cholecystectomy    . Cardioversion      x 2  . Hernia repair  2009  . Abdominal hysterectomy    . Colonoscopy      Diverticulosis, colon polyps, repeat 2014, Dr. Deatra Ina  . Cardioversion N/A 02/27/2013    Procedure: CARDIOVERSION;  Surgeon: Lelon Perla, MD;  Location: [facility] ENDOSCOPY;  Service: Cardiovascular;  Laterality: N/A;  . Esophagogastroduodenoscopy N/A 05/16/2014    Procedure: ESOPHAGOGASTRODUODENOSCOPY (EGD);  Surgeon: Wonda Horner, MD;  Location: [facility] ORS;  Service: Gastroenterology;  Laterality: N/A;   Family History  Problem Relation Age of Onset  . Pneumonia Father  deceased  . Hypertension Father   . Cancer Father   . Multiple sclerosis Mother     hx  . Emphysema Other     runs in the family   Social History  Substance Use Topics  . Smoking status: Never Smoker   . Smokeless tobacco: Never Used  . Alcohol Use: 0.0 oz/week    0 Standard drinks or equivalent per week     Comment: drinks 1 glass of wine every 2 weeks on average (occasional)   Current Outpatient Prescriptions  Medication Sig Dispense Refill  . allopurinol (ZYLOPRIM) 300 MG tablet TAKE 1 TABLET BY MOUTH EVERY DAY 90 tablet 2  . ALPRAZolam (XANAX) 0.5 MG tablet Take 1 tablet (0.5 mg total) by mouth at bedtime as needed for anxiety. 30 tablet 1  . B Complex-C (B-COMPLEX WITH VITAMIN C) tablet Take 1  tablet by mouth daily. 30 tablet 5  . carvedilol (COREG) 6.25 MG tablet TAKE 1 TABLET(6.25 MG) BY MOUTH TWICE DAILY WITH A MEAL 180 tablet 1  . cloNIDine (CATAPRES) 0.2 MG tablet Take 1 tablet (0.2 mg total) by mouth 2 (two) times daily. 60 tablet 6  . cyanocobalamin 1000 MCG tablet Take 1,000 mcg by mouth daily. Reported on 01/21/2016    . dabigatran (PRADAXA) 150 MG CAPS capsule Take 1 capsule (150 mg total) by mouth 2 (two) times daily. 60 capsule 11  . FLUoxetine (PROZAC) 20 MG tablet Take 1 tablet (20 mg total) by mouth daily. 90 tablet 1  . furosemide (LASIX) 40 MG tablet Take 40 mg by mouth daily as needed for fluid or edema. Reported on 01/21/2016    . metFORMIN (GLUCOPHAGE) 500 MG tablet Take 1 tablet (500 mg total) by mouth 2 (two) times daily with a meal. 1 tablet po BID for diabetes 60 tablet 5  . potassium chloride SA (K-DUR,KLOR-CON) 20 MEQ tablet Take 1 tablet (20 mEq total) by mouth daily. 30 tablet 4  . traZODone (DESYREL) 50 MG tablet TAKE 1/2 TO 1 TABLET BY MOUTH EVERY NIGHT AT BEDTIME AS NEEDED FOR SLEEP 90 tablet 3  . colchicine (COLCRYS) 0.6 MG tablet Take 1 tablet (0.6 mg total) by mouth daily as needed. Reported on 01/21/2016 (Patient not taking: Reported on 03/24/2016) 45 tablet 1  . simvastatin (ZOCOR) 10 MG tablet Take 1 tablet (10 mg total) by mouth daily. (Patient not taking: Reported on 03/24/2016) 30 tablet 2  . [DISCONTINUED] propranolol (INDERAL) 60 MG tablet Take 60 mg by mouth daily.       No current facility-administered medications for this visit.   Allergies  Allergen Reactions  . Albuterol     Palpitations, intolerance  . Amiodarone     adverse reaction: Tremor/gait disurbance  . Atorvastatin     Body aches and pains--stiffness.   [name] [Pitavastatin]     Body aches  . Propoxyphene N-Acetaminophen Nausea And Vomiting     Review of Systems: All systems reviewed and negative except where noted in HPI.   CBC Latest Ref Rng 01/21/2016 11/11/2015 08/02/2015    WBC 4.0 - 10.5 K/uL 5.9 7.0 7.3  Hemoglobin 11.7 - 15.5 g/dL 10.6(L) 11.6(L) 10.6(L)  Hematocrit 35.0 - 45.0 % 33.0(L) 36.7 34.0(L)  Platelets 140 - 400 K/uL 210 251 226   Lab Results  Component Value Date   IRON 63 01/21/2016   TIBC 310 01/21/2016   FERRITIN 230 03/04/2015     Physical Exam: BP 134/80 mmHg  Pulse 60  Ht 5' 3.5" (1.613  m)  Wt 267 lb 2 oz (121.167 kg)  BMI 46.57 kg/m2 Constitutional: Pleasant,well-developed, female in no acute distress. HEENT: Normocephalic and atraumatic.  No scleral icterus. Cardiovascular: Normal rate, irregularly irregular Pulmonary/chest: Effort normal and breath sounds normal. No wheezing, rales or rhonchi. Abdominal: Soft, nondistended, protuberant, nontender. Bowel sounds active throughout. There are no masses palpable. No hepatomegaly. Extremities: no edema Lymphadenopathy: No cervical adenopathy noted. Neurological: Alert and oriented to person place and time. Skin: Skin is warm and dry. No rashes noted. Psychiatric: Normal mood and affect. Behavior is normal.   ASSESSMENT AND PLAN: 65 y/o female with history of CHF / atrial fibrillation on Pradaxa, here for evaluation of the following issues  Constipation / bloating - recommend Miralax 17gm daily to BID and titrate as directed, I suspect this will help her bloating. I also counseled her on a low FODMOP diet to help with bloating and she will try this. She can follow up as needed for this issue.   Anemia - stable non-iron deficient normocytic anemia dating back to 2010. She has no symptoms of GI blood loss. Last MCV was 97. I will check B12/folate levels to ensure no deficiency. Otherwise, I don't think colonoscopy is warranted if her iron stores are normal and she has no new bowel symptoms or concern for GI blood loss. She may warrant hematology evaluation if etiology of her chronic anemia remains unclear.   Colon cancer screening - due for recall colonoscopy in 2019, given report of  last exam in 2009 without adenomas unless she has new or concerning symptoms with alarm features. As above, do not think chronic anemia warrants colonoscopy at this time if iron stores normal. Will await results of additional labs.   [place] Cellar, MD [facility] Gastroenterology Pager [phone]

## 2016-03-26 ENCOUNTER — Other Ambulatory Visit: Payer: Self-pay | Admitting: *Deleted

## 2016-03-26 DIAGNOSIS — D649 Anemia, unspecified: Secondary | ICD-10-CM

## 2016-04-06 ENCOUNTER — Telehealth: Payer: Self-pay | Admitting: *Deleted

## 2016-04-06 ENCOUNTER — Telehealth: Payer: Self-pay | Admitting: Hematology

## 2016-04-06 ENCOUNTER — Encounter: Payer: Self-pay | Admitting: Hematology

## 2016-04-06 NOTE — Telephone Encounter (Signed)
Gave patient the appointment date and time at [facility] with Dr. Irene Limbo on 04/15/16 at 2:00 PM. Arrive 30 minutes early.

## 2016-04-06 NOTE — Telephone Encounter (Signed)
Left voicemail regarding appointment scheduled for the patient with Dr. Irene Limbo on 6/28

## 2016-04-10 ENCOUNTER — Other Ambulatory Visit: Payer: Self-pay | Admitting: Medical

## 2016-04-10 NOTE — Telephone Encounter (Signed)
Is this okay to refill? 

## 2016-04-12 NOTE — Telephone Encounter (Signed)
Send in refill, but make sure she has July appt for recheck

## 2016-04-13 NOTE — Telephone Encounter (Signed)
Spoke with pt- f/u scheduled for July 25th. Victor[place] December

## 2016-04-14 ENCOUNTER — Other Ambulatory Visit: Payer: Self-pay | Admitting: Internal Medicine

## 2016-04-15 ENCOUNTER — Telehealth: Payer: Self-pay | Admitting: Hematology

## 2016-04-15 ENCOUNTER — Ambulatory Visit (HOSPITAL_BASED_OUTPATIENT_CLINIC_OR_DEPARTMENT_OTHER): Payer: Medicare Other | Admitting: Hematology

## 2016-04-15 ENCOUNTER — Other Ambulatory Visit (HOSPITAL_BASED_OUTPATIENT_CLINIC_OR_DEPARTMENT_OTHER): Payer: Medicare Other

## 2016-04-15 ENCOUNTER — Encounter: Payer: Self-pay | Admitting: Hematology

## 2016-04-15 VITALS — BP 170/78 | HR 75 | Temp 98.4°F | Resp 18 | Ht 63.5 in | Wt 268.8 lb

## 2016-04-15 DIAGNOSIS — D649 Anemia, unspecified: Secondary | ICD-10-CM

## 2016-04-15 LAB — CBC & DIFF AND RETIC
BASO%: 0.2 % (ref 0.0–2.0)
BASOS ABS: 0 10*3/uL (ref 0.0–0.1)
EOS ABS: 0.1 10*3/uL (ref 0.0–0.5)
EOS%: 2 % (ref 0.0–7.0)
HEMATOCRIT: 32.1 % — AB (ref 34.8–46.6)
HEMOGLOBIN: 10.5 g/dL — AB (ref 11.6–15.9)
IMMATURE RETIC FRACT: 6.5 % (ref 1.60–10.00)
LYMPH#: 3.2 10*3/uL (ref 0.9–3.3)
LYMPH%: 52.6 % — ABNORMAL HIGH (ref 14.0–49.7)
MCH: 30 pg (ref 25.1–34.0)
MCHC: 32.7 g/dL (ref 31.5–36.0)
MCV: 91.7 fL (ref 79.5–101.0)
MONO#: 0.5 10*3/uL (ref 0.1–0.9)
MONO%: 8.3 % (ref 0.0–14.0)
NEUT#: 2.2 10*3/uL (ref 1.5–6.5)
NEUT%: 36.9 % — AB (ref 38.4–76.8)
NRBC: 0 % (ref 0–0)
Platelets: 187 10*3/uL (ref 145–400)
RBC: 3.5 10*6/uL — ABNORMAL LOW (ref 3.70–5.45)
RDW: 15 % — AB (ref 11.2–14.5)
RETIC %: 3.18 % — AB (ref 0.70–2.10)
RETIC CT ABS: 111.3 10*3/uL — AB (ref 33.70–90.70)
WBC: 6 10*3/uL (ref 3.9–10.3)

## 2016-04-15 LAB — COMPREHENSIVE METABOLIC PANEL
ALT: <9 U/L (ref 0–55)
AST: 10 U/L (ref 5–34)
Albumin: 3.6 g/dL (ref 3.5–5.0)
Alkaline Phosphatase: 87 U/L (ref 40–150)
Anion Gap: 9 mEq/L (ref 3–11)
BUN: 20 mg/dL (ref 7.0–26.0)
CO2: 26 mEq/L (ref 22–29)
Calcium: 9.8 mg/dL (ref 8.4–10.4)
Chloride: 106 mEq/L (ref 98–109)
Creatinine: 0.9 mg/dL (ref 0.6–1.1)
EGFR: 78 mL/min/{1.73_m2} — ABNORMAL LOW (ref >90)
Glucose: 97 mg/dl (ref 70–140)
Potassium: 3.7 mEq/L (ref 3.5–5.1)
Sodium: 141 mEq/L (ref 136–145)
Total Bilirubin: <0.3 mg/dL (ref 0.20–1.20)
Total Protein: 7.6 g/dL (ref 6.4–8.3)

## 2016-04-15 LAB — CHCC SMEAR

## 2016-04-15 LAB — LACTATE DEHYDROGENASE: LDH: 175 U/L (ref 125–245)

## 2016-04-15 NOTE — Progress Notes (Signed)
Marland Kitchen    HEMATOLOGY/ONCOLOGY CONSULTATION NOTE  Date of Service: 04/15/2016  Patient Care Team: Jac Canavan, PA-C as PCP - General (Family Medicine)  CHIEF COMPLAINTS/PURPOSE OF CONSULTATION:  Anemia  HISTORY OF PRESENTING ILLNESS:   Stephanie [name] is a wonderful 65 y.o. female who has been referred to [place] by Dr .Aleen Campi, Mertha Finders, PA-C for evaluation and management of Anemia.  Patient has a hx of chronic medical co-morbidities including htn, diastolic CHF, sarcoidosis (diagnosed in 1982 with Xerophthamia/lung spots, treated with 1 yr of steroids at that time), obesity, IBS, gout reports increased fatigue for about 8 months.  Recent labs from February 04, 2023 shows some mild anemia with a hgb of 10.6, MCV of 97 with normal PLT and WBC counts. She notes that she has had some mild anemia atleast since 2010. She has previous had a TAH for excessive bleeding due to fibroids. Notes that her grandson passed away in 04-Feb-2023 this year and this has been weighing on her a lot. No chest pain/light headedness/overt SOB at rest.  No overt bleeding reported currently.  MEDICAL HISTORY:  Past Medical History  Diagnosis Date  . Coronary artery disease     no CAD by cath 11.2010  . Diastolic CHF, chronic (HCC)   . Persistent atrial fibrillation (HCC)     s/p DCCV 07/2009; Pradaxa Rx  . Dyslipidemia   . Chronic venous hypertension without complications   . Sarcoidosis (HCC)     dx by eye exam  . Obesity   . Lung nodule     4mm RLL nodule on CT Chest  07/2009, resolved by 2011 CT  . Irritable bowel syndrome   . Hepatitis, unspecified     hx of  . Depressive disorder, not elsewhere classified   . Colon polyps   . Hypertension   . Glucose intolerance (impaired glucose tolerance)     hgb AIC 6.1 09/02/2009  . Hx of hysterectomy 2002  . Ankle fracture, right     x 2  . Noncompliance   . PONV (postoperative nausea and vomiting)   . Antineoplastic and immunosuppressive drugs causing adverse  effect in therapeutic use   . Gout   . Sleep disturbance 06/2013    sleep study, mild abnormality, no criteria for CPAP  . Diverticulosis of colon 2009    colonoscopy  . Colon polyps 2009  . Anemia   . Asthma   . Hyperlipidemia     SURGICAL HISTORY: Past Surgical History  Procedure Laterality Date  . Cholecystectomy    . Cardioversion      x 2  . Hernia repair  2009  . Abdominal hysterectomy    . Colonoscopy      Diverticulosis, colon polyps, repeat 2014, Dr. Arlyce Dice  . Cardioversion N/A 02/27/2013    Procedure: CARDIOVERSION;  Surgeon: Lewayne Bunting, MD;  Location: [facility] ENDOSCOPY;  Service: Cardiovascular;  Laterality: N/A;  . Esophagogastroduodenoscopy N/A 05/16/2014    Procedure: ESOPHAGOGASTRODUODENOSCOPY (EGD);  Surgeon: Graylin Shiver, MD;  Location: [facility] ORS;  Service: Gastroenterology;  Laterality: N/A;    SOCIAL HISTORY: Social History   Social History  . Marital Status: Single    Spouse Name: N/A  . Number of Children: 5  . Years of Education: college   Occupational History  . Not on file.   Social History Main Topics  . Smoking status: Never Smoker   . Smokeless tobacco: Never Used  . Alcohol Use: 0.0 oz/week    0 Standard drinks or equivalent per  week     Comment: drinks 1 glass of wine every 2 weeks on average (occasional)  . Drug Use: No  . Sexual Activity: Not Currently    Birth Control/ Protection: Surgical   Other Topics Concern  . Not on file   Social History Narrative   Pt lives in [place] with her son.  Unemployed.  Right handed.     Education college       FAMILY HISTORY: Family History  Problem Relation Age of Onset  . Pneumonia Father     deceased  . Hypertension Father   . Cancer Father   . Multiple sclerosis Mother     hx  . Emphysema Other     runs in the family    ALLERGIES:  is allergic to albuterol; amiodarone; atorvastatin; livalo; and propoxyphene n-acetaminophen.  MEDICATIONS:  Current Outpatient Prescriptions    Medication Sig Dispense Refill  . allopurinol (ZYLOPRIM) 300 MG tablet TAKE 1 TABLET BY MOUTH EVERY DAY 90 tablet 2  . ALPRAZolam (XANAX) 0.5 MG tablet Take 1 tablet (0.5 mg total) by mouth at bedtime as needed for anxiety. 30 tablet 1  . B Complex-C (B-COMPLEX WITH VITAMIN C) tablet Take 1 tablet by mouth daily. 30 tablet 5  . carvedilol (COREG) 6.25 MG tablet TAKE 1 TABLET(6.25 MG) BY MOUTH TWICE DAILY WITH A MEAL 180 tablet 1  . cloNIDine (CATAPRES) 0.2 MG tablet Take 1 tablet (0.2 mg total) by mouth 2 (two) times daily. 60 tablet 6  . colchicine (COLCRYS) 0.6 MG tablet Take 1 tablet (0.6 mg total) by mouth daily as needed. Reported on 01/21/2016 (Patient not taking: Reported on 03/24/2016) 45 tablet 1  . cyanocobalamin 1000 MCG tablet Take 1,000 mcg by mouth daily. Reported on 01/21/2016    . dabigatran (PRADAXA) 150 MG CAPS capsule Take 1 capsule (150 mg total) by mouth 2 (two) times daily. 60 capsule 11  . FLUoxetine (PROZAC) 20 MG tablet Take 1 tablet (20 mg total) by mouth daily. 90 tablet 1  . FLUoxetine (PROZAC) 20 MG tablet TAKE 1 TABLET BY MOUTH DAILY 90 tablet 0  . furosemide (LASIX) 40 MG tablet Take 40 mg by mouth daily as needed for fluid or edema. Reported on 01/21/2016    . furosemide (LASIX) 40 MG tablet TAKE 1 TABLET(40 MG) BY MOUTH DAILY AS NEEDED FOR FLUID RETENTION OR SWELLING 30 tablet 2  . metFORMIN (GLUCOPHAGE) 500 MG tablet Take 1 tablet (500 mg total) by mouth 2 (two) times daily with a meal. 1 tablet po BID for diabetes 60 tablet 5  . polyethylene glycol powder (GLYCOLAX/MIRALAX) powder Mix 17 grams in 8 oz of water 1-2 x daily 255 g 3  . potassium chloride SA (K-DUR,KLOR-CON) 20 MEQ tablet Take 1 tablet (20 mEq total) by mouth daily. 30 tablet 4  . simvastatin (ZOCOR) 10 MG tablet Take 1 tablet (10 mg total) by mouth daily. (Patient not taking: Reported on 03/24/2016) 30 tablet 2  . traZODone (DESYREL) 50 MG tablet TAKE 1/2 TO 1 TABLET BY MOUTH EVERY NIGHT AT BEDTIME AS NEEDED  FOR SLEEP 90 tablet 3  . [DISCONTINUED] propranolol (INDERAL) 60 MG tablet Take 60 mg by mouth daily.       No current facility-administered medications for this visit.    REVIEW OF SYSTEMS:    10 Point review of Systems was done is negative except as noted above.  PHYSICAL EXAMINATION: ECOG PERFORMANCE STATUS: 2 - Symptomatic, <50% confined to bed  . Vitals:  04/15/16 1407  BP: (!) 170/78  Pulse: 75  Resp: 18  Temp: 98.4 F (36.9 C)   Filed Weights   04/15/16 1407  Weight: 268 lb 12.8 oz (121.9 kg)   .Body mass index is 46.87 kg/m.  GENERAL:alert, in no acute distress and comfortable SKIN: skin color, texture, turgor are normal, no rashes or significant lesions EYES: normal, conjunctiva are pink and non-injected, sclera clear OROPHARYNX:no exudate, no erythema and lips, buccal mucosa, and tongue normal  NECK: supple, no JVD, thyroid normal size, non-tender, without nodularity LYMPH:  no palpable lymphadenopathy in the cervical, axillary or inguinal LUNGS: clear to auscultation with normal respiratory effort HEART: regular rate & rhythm,  no murmurs and no lower extremity edema ABDOMEN: abdomen soft, non-tender, normoactive bowel sounds  Musculoskeletal: no cyanosis of digits and no clubbing  PSYCH: alert & oriented x 3 with fluent speech NEURO: no focal motor/sensory deficits  LABORATORY DATA:  I have reviewed the data as listed  Component     Latest Ref Rng & Units 03/24/2016 04/15/2016  WBC     3.9 - 10.3 10e3/uL  6.0  NEUT#     1.5 - 6.5 10e3/uL  2.2  Hemoglobin     11.6 - 15.9 g/dL  10.5 (L)  HCT     34.8 - 46.6 %  32.1 (L)  Platelets     145 - 400 10e3/uL  187  MCV     79.5 - 101.0 fL  91.7  MCH     25.1 - 34.0 pg  30.0  MCHC     31.5 - 36.0 g/dL  32.7  RBC     3.70 - 5.45 10e6/uL  3.50 (L)  RDW     11.2 - 14.5 %  15.0 (H)  lymph#     0.9 - 3.3 10e3/uL  3.2  MONO#     0.1 - 0.9 10e3/uL  0.5  Eosinophils Absolute     0.0 - 0.5 10e3/uL  0.1    Basophils Absolute     0.0 - 0.1 10e3/uL  0.0  NEUT%     38.4 - 76.8 %  36.9 (L)  LYMPH%     14.0 - 49.7 %  52.6 (H)  MONO%     0.0 - 14.0 %  8.3  EOS%     0.0 - 7.0 %  2.0  BASO%     0.0 - 2.0 %  0.2  nRBC     0 - 0 %  0  Retic %     0.70 - 2.10 %  3.18 (H)  Retic Ct Abs     33.70 - 90.70 10e3/uL  111.30 (H)  Immature Retic Fract     1.60 - 10.00 %  6.50  Sodium     136 - 145 mEq/L  141  Potassium     3.5 - 5.1 mEq/L  3.7  Chloride     98 - 109 mEq/L  106  CO2     22 - 29 mEq/L  26  Glucose     70 - 140 mg/dl  97  BUN     7.0 - 26.0 mg/dL  20.0  Creatinine     0.6 - 1.1 mg/dL  0.9  Total Bilirubin     0.20 - 1.20 mg/dL  <0.30  Alkaline Phosphatase     40 - 150 U/L  87  AST     5 - 34 U/L  10  ALT  0 - 55 U/L  <9  Total Protein     6.4 - 8.3 g/dL  7.6  Albumin     3.5 - 5.0 g/dL  3.6  Calcium     8.4 - 10.4 mg/dL  9.8  Anion gap     3 - 11 mEq/L  9  EGFR     >90 ml/min/1.73 m2  78 (L)  IgG (Immunoglobin G), Serum     700 - 1600 mg/dL  1,173  IgA/Immunoglobulin A, Serum     87 - 352 mg/dL  327  IgM, Qn, Serum     26 - 217 mg/dL  117  Total Protein     6.0 - 8.5 g/dL  6.8  Albumin SerPl Elph-Mcnc     2.9 - 4.4 g/dL  3.6  Alpha 1     0.0 - 0.4 g/dL  0.2  Alpha2 Glob SerPl Elph-Mcnc     0.4 - 1.0 g/dL  0.7  B-Globulin SerPl Elph-Mcnc     0.7 - 1.3 g/dL  1.2  Gamma Glob SerPl Elph-Mcnc     0.4 - 1.8 g/dL  1.2  M Protein SerPl Elph-Mcnc     Not Observed g/dL  Not Observed  Globulin, Total     2.2 - 3.9 g/dL  3.2  Albumin/Glob SerPl     0.7 - 1.7  1.2  IFE 1       Comment  Please Note (HCV):       Comment  Ig Kappa Free Light Chain     3.3 - 19.4 mg/L  24.4 (H)  Ig Lambda Free Light Chain     5.7 - 26.3 mg/L  17.5  Kappa/Lambda FluidC Ratio     0.26 - 1.65  1.39  Vitamin B12     211 - 911 pg/mL 502   Folate     >5.9 ng/mL 15.0   Sed Rate     0 - 40 mm/hr  63 (H)  CRP     0.0 - 4.9 mg/L  6.5 (H)  Ferritin     9 - 269 ng/ml   239  LDH     125 - 245 U/L  175  Haptoglobin     34 - 200 mg/dL  202 (H)  Erythropoietin     2.6 - 18.5 mIU/mL  23.8 (H)  Angio Convert Enzyme     14 - 82 U/L  23  TSH     0.308 - 3.960 m(IU)/L  1.913   .Marland Kitchen CBC    Component Value Date/Time   WBC 5.9 01/21/2016 0001   RBC 3.40* 01/21/2016 0001   RBC 3.47* 08/02/2009 0124   HGB 10.6* 01/21/2016 0001   HCT 33.0* 01/21/2016 0001   PLT 210 01/21/2016 0001   MCV 97.1 01/21/2016 0001   MCH 31.2 01/21/2016 0001   MCHC 32.1 01/21/2016 0001   RDW 14.8 01/21/2016 0001   LYMPHSABS 2596 01/21/2016 0001   MONOABS 472 01/21/2016 0001   EOSABS 118 01/21/2016 0001   BASOSABS 59 01/21/2016 0001     . CMP Latest Ref Rng 01/21/2016 11/11/2015 08/02/2015  Glucose 65 - 99 mg/dL 164(H) 151(H) 133(H)  BUN 7 - 25 mg/dL _0 Creatinine 0.50 - 0.99 mg/dL 0.97 1.07(H) 1.06(H)  Sodium 135 - 146 mmol/L 140 141 138  Potassium 3.5 - 5.3 mmol/L 4.7 3.7 3.8  Chloride 98 - 110 mmol/L 104 100 97(L)  CO2 20 - 31 mmol/L _1 Calcium  8.6 - 10.4 mg/dL 9.4 9.5 9.3  Total Protein 6.1 - 8.1 g/dL 6.8 - 7.4  Total Bilirubin 0.2 - 1.2 mg/dL 0.4 - 0.6  Alkaline Phos 33 - 130 U/L 83 - 92  AST 10 - 35 U/L 10 - 11  ALT 6 - 29 U/L 6 - 8    RADIOGRAPHIC STUDIES: I have personally reviewed the radiological images as listed and agreed with the findings in the report. No results found.  ASSESSMENT & PLAN:   65 yo AAF with   1) Chronic normocytic Normochromic anemia. It has been present for several years and is quite mild and not expected to cause any symptoms at this time.  2) h/o Sarcoidosis - treated with steroids in 1982 for about 1 yr. No recent treatment required.  Plan -Outside labs results explained in details and possiblities discussed. -workup for posible etiologies of her NCNC anemia ordered as noted below. Orders Placed This Encounter  Procedures  . CBC & Diff and Retic    Standing Status:   Future    Number of Occurrences:   1     Standing Expiration Date:   04/15/2017  . Comprehensive metabolic panel    Standing Status:   Future    Number of Occurrences:   1    Standing Expiration Date:   04/15/2017  . Sedimentation rate    Standing Status:   Future    Number of Occurrences:   1    Standing Expiration Date:   04/15/2017  . C-reactive protein    Standing Status:   Future    Number of Occurrences:   1    Standing Expiration Date:   04/15/2017  . Ferritin    Standing Status:   Future    Number of Occurrences:   1    Standing Expiration Date:   04/15/2017  . Lactate dehydrogenase (LDH)    Standing Status:   Future    Number of Occurrences:   1    Standing Expiration Date:   04/15/2017  . Haptoglobin    Standing Status:   Future    Number of Occurrences:   1    Standing Expiration Date:   04/15/2017  . Smear    Standing Status:   Future    Number of Occurrences:   1    Standing Expiration Date:   04/15/2017  . Erythropoietin    Standing Status:   Future    Number of Occurrences:   1    Standing Expiration Date:   04/15/2017  . Angiotensin converting enzyme    Standing Status:   Future    Number of Occurrences:   1    Standing Expiration Date:   04/15/2017  . Multiple Myeloma Panel (SPEP&IFE w/QIG)    Standing Status:   Future    Number of Occurrences:   1    Standing Expiration Date:   04/15/2017  . Kappa/lambda light chains    Standing Status:   Future    Number of Occurrences:   1    Standing Expiration Date:   05/20/2017  . TSH    Standing Status:   Future    Number of Occurrences:   1    Standing Expiration Date:   04/15/2017   -no indication for PRBC transfusion or BM Bx at this time.  #3  Patient Active Problem List   Diagnosis Date Noted  . Atrial fibrillation with RVR ([place]) 10/18/2016  . Acute on chronic diastolic  CHF (congestive heart failure), NYHA class 2 ([place]) 10/18/2016  . Hypokalemia 10/18/2016  . Need for prophylactic vaccination against Streptococcus pneumoniae (pneumococcus) 08/20/2016    . Type 2 diabetes mellitus with complication, without long-term current use of insulin ([place]) 05/12/2016  . Edema 05/12/2016  . B12 deficiency 05/06/2016  . Normocytic anemia 04/15/2016  . Insomnia 01/21/2016  . Pelvic pain in female 08/06/2015  . Screening for breast cancer 08/06/2015  . History of anemia 08/02/2015  . Other specified nutritional anemias 08/02/2015  . Urinary pain 08/02/2015  . Need for prophylactic vaccination and inoculation against influenza 08/02/2015  . Obesity 08/02/2015  . Dyslipidemia 08/02/2015  . History of sarcoidosis 08/02/2015  . Generalized anxiety disorder 08/02/2015  . Other secondary chronic gout 08/02/2015  . Abnormal thyroid blood test 08/02/2015  . Physical exam 08/02/2015  . Needs flu shot 08/02/2015  . Dysthymic disorder 08/02/2015  . Ataxia 08/22/2014  . Cardiomyopathy, secondary ([place]) 01/28/2011  . CHRONIC DIASTOLIC HEART FAILURE 95/62/1308  . Essential hypertension 09/02/2009  . Atrial fibrillation ([place]) 08/20/2009  . Lung nodule 08/20/2009  . CHRONIC VENOUS HYPERTENSION WITHOUT COMPS 11/21/2007   -continue f/u with PCP for management of other medical co-morbids. -workup for other etiologies of fatigue - ?sarcoidosis progression, dysthimia/depression and optimization of other medical co-morbids. -counseled patient on importance of maintaining physical activity and trying to achieve ideal body weight.  RTC with Dr Irene Limbo in 3 weeks to discuss results of her above workup  All of the patients questions were answered with apparent satisfaction. The patient knows to call the clinic with any problems, questions or concerns.  I spent 45 minutes counseling the patient face to face. The total time spent in the appointment was 60 minutes and more than 50% was on counseling and direct patient cares.    Sullivan Lone MD [place] AAHIVMS [facility] Hematology/Oncology Physician [facility]  (Office):       [phone] (Work cell):   [phone] (Fax):           [phone]  04/15/2016 2:01 PM

## 2016-04-15 NOTE — Telephone Encounter (Signed)
Gave pt cal & avs °

## 2016-04-16 LAB — ERYTHROPOIETIN: Erythropoietin: 23.8 m[IU]/mL — ABNORMAL HIGH (ref 2.6–18.5)

## 2016-04-16 LAB — KAPPA/LAMBDA LIGHT CHAINS
IG KAPPA FREE LIGHT CHAIN: 24.4 mg/L — AB (ref 3.3–19.4)
IG LAMBDA FREE LIGHT CHAIN: 17.5 mg/L (ref 5.7–26.3)
Kappa/Lambda FluidC Ratio: 1.39 (ref 0.26–1.65)

## 2016-04-16 LAB — TSH: TSH: 1.913 m[IU]/L (ref 0.308–3.960)

## 2016-04-16 LAB — HAPTOGLOBIN: Haptoglobin: 202 mg/dL — ABNORMAL HIGH (ref 34–200)

## 2016-04-16 LAB — FERRITIN: Ferritin: 239 ng/ml (ref 9–269)

## 2016-04-16 LAB — SEDIMENTATION RATE: Sedimentation Rate-Westergren: 63 mm/hr — ABNORMAL HIGH (ref 0–40)

## 2016-04-16 LAB — C-REACTIVE PROTEIN: CRP: 6.5 mg/L — AB (ref 0.0–4.9)

## 2016-04-16 LAB — ANGIOTENSIN CONVERTING ENZYME: Angio Convert Enzyme: 23 U/L (ref 14–82)

## 2016-04-17 LAB — MULTIPLE MYELOMA PANEL, SERUM
ALBUMIN/GLOB SERPL: 1.2 (ref 0.7–1.7)
ALPHA 1: 0.2 g/dL (ref 0.0–0.4)
ALPHA2 GLOB SERPL ELPH-MCNC: 0.7 g/dL (ref 0.4–1.0)
Albumin SerPl Elph-Mcnc: 3.6 g/dL (ref 2.9–4.4)
B-Globulin SerPl Elph-Mcnc: 1.2 g/dL (ref 0.7–1.3)
GAMMA GLOB SERPL ELPH-MCNC: 1.2 g/dL (ref 0.4–1.8)
GLOBULIN, TOTAL: 3.2 g/dL (ref 2.2–3.9)
IGA/IMMUNOGLOBULIN A, SERUM: 327 mg/dL (ref 87–352)
IGM (IMMUNOGLOBIN M), SRM: 117 mg/dL (ref 26–217)
Total Protein: 6.8 g/dL (ref 6.0–8.5)

## 2016-04-23 ENCOUNTER — Other Ambulatory Visit: Payer: Self-pay | Admitting: Medical

## 2016-04-23 NOTE — Telephone Encounter (Signed)
Is this ok to refill? Not on current medication list

## 2016-05-06 ENCOUNTER — Ambulatory Visit (HOSPITAL_BASED_OUTPATIENT_CLINIC_OR_DEPARTMENT_OTHER): Payer: Medicare Other | Admitting: Hematology

## 2016-05-06 ENCOUNTER — Telehealth: Payer: Self-pay | Admitting: Hematology

## 2016-05-06 ENCOUNTER — Encounter: Payer: Self-pay | Admitting: Hematology

## 2016-05-06 VITALS — BP 159/92 | HR 62 | Temp 98.2°F | Resp 18 | Ht 63.5 in | Wt 268.1 lb

## 2016-05-06 DIAGNOSIS — D649 Anemia, unspecified: Secondary | ICD-10-CM

## 2016-05-06 DIAGNOSIS — E538 Deficiency of other specified B group vitamins: Secondary | ICD-10-CM | POA: Insufficient documentation

## 2016-05-06 NOTE — Patient Instructions (Signed)
-  Please take B complex 1 tablet by mouth daily and vitamin B12 liquid 1000 g sublingually daily. -We will see back in 4 months with labs to monitor your anemia.

## 2016-05-06 NOTE — Telephone Encounter (Signed)
Gave pt cal & avs °

## 2016-05-12 ENCOUNTER — Ambulatory Visit (INDEPENDENT_AMBULATORY_CARE_PROVIDER_SITE_OTHER): Payer: Medicare Other | Admitting: Medical

## 2016-05-12 ENCOUNTER — Encounter: Payer: Self-pay | Admitting: Medical

## 2016-05-12 VITALS — BP 134/90 | HR 64 | Wt 268.0 lb

## 2016-05-12 DIAGNOSIS — F411 Generalized anxiety disorder: Secondary | ICD-10-CM | POA: Diagnosis not present

## 2016-05-12 DIAGNOSIS — D649 Anemia, unspecified: Secondary | ICD-10-CM | POA: Diagnosis not present

## 2016-05-12 DIAGNOSIS — F341 Dysthymic disorder: Secondary | ICD-10-CM

## 2016-05-12 DIAGNOSIS — I5032 Chronic diastolic (congestive) heart failure: Secondary | ICD-10-CM | POA: Diagnosis not present

## 2016-05-12 DIAGNOSIS — R609 Edema, unspecified: Secondary | ICD-10-CM | POA: Insufficient documentation

## 2016-05-12 DIAGNOSIS — Z794 Long term (current) use of insulin: Secondary | ICD-10-CM | POA: Insufficient documentation

## 2016-05-12 DIAGNOSIS — E785 Hyperlipidemia, unspecified: Secondary | ICD-10-CM | POA: Diagnosis not present

## 2016-05-12 DIAGNOSIS — I10 Essential (primary) hypertension: Secondary | ICD-10-CM

## 2016-05-12 DIAGNOSIS — I4891 Unspecified atrial fibrillation: Secondary | ICD-10-CM

## 2016-05-12 DIAGNOSIS — E118 Type 2 diabetes mellitus with unspecified complications: Secondary | ICD-10-CM

## 2016-05-12 DIAGNOSIS — G4700 Insomnia, unspecified: Secondary | ICD-10-CM | POA: Diagnosis not present

## 2016-05-12 DIAGNOSIS — E119 Type 2 diabetes mellitus without complications: Secondary | ICD-10-CM | POA: Insufficient documentation

## 2016-05-12 DIAGNOSIS — IMO0001 Reserved for inherently not codable concepts without codable children: Secondary | ICD-10-CM | POA: Insufficient documentation

## 2016-05-12 DIAGNOSIS — J4530 Mild persistent asthma, uncomplicated: Secondary | ICD-10-CM | POA: Diagnosis not present

## 2016-05-12 DIAGNOSIS — Z862 Personal history of diseases of the blood and blood-forming organs and certain disorders involving the immune mechanism: Secondary | ICD-10-CM

## 2016-05-12 LAB — LIPID PANEL
CHOLESTEROL: 213 mg/dL — AB (ref 125–200)
HDL: 59 mg/dL (ref >=46)
LDL Cholesterol: 130 mg/dL — ABNORMAL HIGH (ref <130)
Total CHOL/HDL Ratio: 3.6 Ratio (ref <=5.0)
Triglycerides: 120 mg/dL (ref <150)
VLDL: 24 mg/dL (ref <30)

## 2016-05-12 MED ORDER — ALBUTEROL SULFATE HFA 108 (90 BASE) MCG/ACT IN AERS: 2.0000 | INHALATION_SPRAY | Freq: Four times a day (QID) | RESPIRATORY_TRACT | 1 refills | Status: DC | PRN

## 2016-05-12 MED ORDER — ALPRAZOLAM 0.5 MG PO TABS: 0.5000 mg | ORAL_TABLET | Freq: Every evening | ORAL | 1 refills | Status: DC | PRN

## 2016-05-12 MED ORDER — FLUOXETINE HCL 20 MG PO TABS: 20.0000 mg | ORAL_TABLET | Freq: Every day | ORAL | 1 refills | Status: DC

## 2016-05-12 NOTE — Patient Instructions (Addendum)
Encounter Diagnoses  Name Primary?  . Type 2 diabetes mellitus with complication, without long-term current use of insulin (Stephanie [name]) Yes  . History of anemia   . Normocytic anemia   . Insomnia   . Generalized anxiety disorder   . Dysthymic disorder   . Dyslipidemia   . Edema, unspecified type   . Asthma, mild persistent, uncomplicated    Recommendations:  Use Albuterol inhaler as needed  Begin back on Lasix 1/2 tablet daily as needed.  consider taking this in the day so it doesn't interfere with sleep  We will call with lab results  Continue your medications as usual  Use compression hose to help with swelling.   Check your feet daily for wounds/sores  See eye doctor yearly  I need to see you every 3 months for now regarding medications and diabetes  Check sugars some and write these down. Check fasting in the morning or before meals  The goal is to see sugars regarding under 130 fasting in the morning and to avoid low readings under 70

## 2016-05-12 NOTE — Progress Notes (Signed)
Subjective: Chief Complaint  Patient presents with  . Follow-up    medication check. no problems or concerns   Here for 3 mo f/u.  She has numerous medical problems including CHF, new onset diabetes 01/2016, dyslipidemia, insomnia, anxiety, CAD, asthma, anemia.   Here for med check.      Having some swelling in legs, particular if driving for a length of time.   Not taking lasix regularly.  Not using compression hose regularly.  She takes Lasix at night although this interferes with sleep.  She states that taking it daily during the day makes her go up and down the steps in her house more which she doesn't want to do.    New onset diabetes in April 2017 - denies polyuria, denies polydipsia, no blurred vision.   taking metformin BID.  Checking feet some, not checking sugars.   doesn't want to stick herself.    Lives with son and dog.     Still has issues with sleep, uses trazodone prn  Uses fluoxetine daily and xanax prn.  This works pretty good for anxiety.  She is still dealing with loss of her 11yo grandson with cerebral palsy earlier this year.   Past Medical History:  Diagnosis Date  . Anemia   . Ankle fracture, right    x 2  . Antineoplastic and immunosuppressive drugs causing adverse effect in therapeutic use   . Asthma   . Chronic venous hypertension without complications   . Colon polyps   . Colon polyps 2009  . Coronary artery disease    no CAD by cath 11.2010  . Depressive disorder, not elsewhere classified   . Diastolic CHF, chronic ([place])   . Diverticulosis of colon 2009   colonoscopy  . Dyslipidemia   . Glucose intolerance (impaired glucose tolerance)    hgb AIC 6.1 09/02/2009  . Gout   . Hepatitis, unspecified    hx of  . Hx of hysterectomy 2002  . Hyperlipidemia   . Hypertension   . Irritable bowel syndrome   . Lung nodule    41mm RLL nodule on CT Chest  07/2009, resolved by 2011 CT  . Noncompliance   . Obesity   . Persistent atrial fibrillation ([place])     s/p DCCV 07/2009; Pradaxa Rx  . PONV (postoperative nausea and vomiting)   . Sarcoidosis ([place])    dx by eye exam  . Sleep disturbance 06/2013   sleep study, mild abnormality, no criteria for CPAP   Current Outpatient Prescriptions on File Prior to Visit  Medication Sig Dispense Refill  . allopurinol (ZYLOPRIM) 300 MG tablet TAKE 1 TABLET BY MOUTH EVERY DAY 90 tablet 2  . carvedilol (COREG) 6.25 MG tablet TAKE 1 TABLET(6.25 MG) BY MOUTH TWICE DAILY WITH A MEAL 180 tablet 1  . cloNIDine (CATAPRES) 0.2 MG tablet Take 1 tablet (0.2 mg total) by mouth 2 (two) times daily. 60 tablet 6  . dabigatran (PRADAXA) 150 MG CAPS capsule Take 1 capsule (150 mg total) by mouth 2 (two) times daily. 60 capsule 11  . metFORMIN (GLUCOPHAGE) 500 MG tablet Take 1 tablet (500 mg total) by mouth 2 (two) times daily with a meal. 1 tablet po BID for diabetes 60 tablet 5  . B Complex-C (B-COMPLEX WITH VITAMIN C) tablet Take 1 tablet by mouth daily. (Patient not taking: Reported on 05/12/2016) 30 tablet 5  . colchicine (COLCRYS) 0.6 MG tablet Take 1 tablet (0.6 mg total) by mouth daily as needed. Reported on  01/21/2016 (Patient not taking: Reported on 05/12/2016) 45 tablet 1  . furosemide (LASIX) 40 MG tablet TAKE 1 TABLET(40 MG) BY MOUTH DAILY AS NEEDED FOR FLUID RETENTION OR SWELLING (Patient not taking: Reported on 05/12/2016) 30 tablet 2  . polyethylene glycol powder (GLYCOLAX/MIRALAX) powder Mix 17 grams in 8 oz of water 1-2 x daily (Patient not taking: Reported on 05/12/2016) 255 g 3  . potassium chloride SA (K-DUR,KLOR-CON) 20 MEQ tablet Take 1 tablet (20 mEq total) by mouth daily. (Patient not taking: Reported on 05/12/2016) 30 tablet 4  . simvastatin (ZOCOR) 10 MG tablet TAKE 1 TABLET(10 MG) BY MOUTH DAILY (Patient not taking: Reported on 05/12/2016) 30 tablet 0  . traZODone (DESYREL) 50 MG tablet TAKE 1/2 TO 1 TABLET BY MOUTH EVERY NIGHT AT BEDTIME AS NEEDED FOR SLEEP (Patient not taking: Reported on 05/12/2016) 90 tablet  3  . [DISCONTINUED] propranolol (INDERAL) 60 MG tablet Take 60 mg by mouth daily.       No current facility-administered medications on file prior to visit.      ROS as in subjective     Objective: BP 134/90   Pulse 64   Wt 268 lb (121.6 kg)   BMI 46.73 kg/m   Wt Readings from Last 3 Encounters:  05/12/16 268 lb (121.6 kg)  05/06/16 268 lb 1.6 oz (121.6 kg)  04/15/16 268 lb 12.8 oz (121.9 kg)   BP Readings from Last 3 Encounters:  05/12/16 134/90  05/06/16 (!) 159/92  04/15/16 (!) 170/78   Gen: wd, wn, nad HENT unremarkable Lungs clear, no rhonchi, no rales, no wheezes Heart: irregular, no murmur Ext: 1+ bilat LE edema Pulses 1+ bilat UE and LE Psych: pleasant, good eye contact, answers questions appropriately   Assessment: Encounter Diagnoses  Name Primary?  . Type 2 diabetes mellitus with complication, without long-term current use of insulin ([place]) Yes  . History of anemia   . Normocytic anemia   . Insomnia   . Generalized anxiety disorder   . Dysthymic disorder   . Dyslipidemia   . Edema, unspecified type   . Asthma, mild persistent, uncomplicated   . Atrial fibrillation, unspecified type ([place])   . CHRONIC DIASTOLIC HEART FAILURE   . Essential hypertension     Plan: reviewed recent labs for CBC and CMET through hematology. Advised she use glucometer to monitor sugars at home and record these Check feet daily for sores, wounds See eye doctor yearly for screening for retinopathy compliant with metformin and statin, labs today Edema - advised she use Lasix in the day, and start back 20mg  or 1/2 tablet daily insomnia - discussed sleep hygiene, c/t trazodone Anxiety - c/t fluoxetine, xanax prn, consider counseling. F/u with cardiology as planned, c/t same medication    Stephanie [name] was seen today for follow-up.  Diagnoses and all orders for this visit:  Type 2 diabetes mellitus with complication, without long-term current use of insulin ([facility]) -      Hemoglobin A1c  History of anemia  Normocytic anemia  Insomnia  Generalized anxiety disorder  Dysthymic disorder  Dyslipidemia -     Lipid panel  Edema, unspecified type  Asthma, mild persistent, uncomplicated  Atrial fibrillation, unspecified type ([place])  CHRONIC DIASTOLIC HEART FAILURE  Essential hypertension  Other orders -     ALPRAZolam (XANAX) 0.5 MG tablet; Take 1 tablet (0.5 mg total) by mouth at bedtime as needed for anxiety. -     FLUoxetine (PROZAC) 20 MG tablet; Take 1 tablet (20 mg  total) by mouth daily. -     Discontinue: albuterol (PROVENTIL HFA;VENTOLIN HFA) 108 (90 Base) MCG/ACT inhaler; Inhale 2 puffs into the lungs every 6 (six) hours as needed for wheezing or shortness of breath. -     albuterol (PROVENTIL HFA;VENTOLIN HFA) 108 (90 Base) MCG/ACT inhaler; Inhale 2 puffs into the lungs every 6 (six) hours as needed for wheezing or shortness of breath.

## 2016-05-13 ENCOUNTER — Other Ambulatory Visit: Payer: Self-pay | Admitting: Medical

## 2016-05-13 LAB — HEMOGLOBIN A1C
HEMOGLOBIN A1C: 6.5 % — AB (ref <5.7)
MEAN PLASMA GLUCOSE: 140 mg/dL

## 2016-05-13 MED ORDER — METFORMIN HCL 500 MG PO TABS: 500.0000 mg | ORAL_TABLET | Freq: Two times a day (BID) | ORAL | 1 refills | Status: DC

## 2016-05-13 MED ORDER — FUROSEMIDE 40 MG PO TABS: ORAL_TABLET | ORAL | 2 refills | Status: DC

## 2016-05-13 MED ORDER — SIMVASTATIN 20 MG PO TABS
20.0000 mg | ORAL_TABLET | Freq: Every day | ORAL | 1 refills | Status: DC
Start: 2016-05-13 — End: 2016-06-18

## 2016-05-13 MED ORDER — TRAZODONE HCL 50 MG PO TABS: ORAL_TABLET | ORAL | 3 refills | Status: DC

## 2016-05-25 ENCOUNTER — Other Ambulatory Visit: Payer: Self-pay | Admitting: Medical

## 2016-06-04 ENCOUNTER — Encounter: Payer: Self-pay | Admitting: Internal Medicine

## 2016-06-18 ENCOUNTER — Encounter: Payer: Self-pay | Admitting: Internal Medicine

## 2016-06-18 ENCOUNTER — Encounter (INDEPENDENT_AMBULATORY_CARE_PROVIDER_SITE_OTHER): Payer: Self-pay

## 2016-06-18 ENCOUNTER — Ambulatory Visit (INDEPENDENT_AMBULATORY_CARE_PROVIDER_SITE_OTHER): Payer: Medicare Other | Admitting: Internal Medicine

## 2016-06-18 VITALS — BP 152/80 | HR 46 | Ht 63.0 in | Wt 266.4 lb

## 2016-06-18 DIAGNOSIS — I480 Paroxysmal atrial fibrillation: Secondary | ICD-10-CM | POA: Diagnosis not present

## 2016-06-18 MED ORDER — LOSARTAN POTASSIUM 25 MG PO TABS: 25.0000 mg | ORAL_TABLET | Freq: Every day | ORAL | 3 refills | Status: DC

## 2016-06-18 NOTE — Patient Instructions (Addendum)
Medication Instructions: - Your physician has recommended you make the following change in your medication:  1) When you finish your current supply of clonidine, stop this medication 2) you will then start losartan 25 mg once daily  Labwork: - none   Procedures/Testing: - none  Follow-Up: - Your physician wants you to follow-up in: 6 months with Dr. [name]. You will receive a reminder letter in the mail two months in advance. If you don't receive a letter, please call our office to schedule the follow-up appointment.  Any Additional Special Instructions Will Be Listed Below (If Applicable).     If you need a refill on your cardiac medications before your next appointment, please call your pharmacy.

## 2016-06-18 NOTE — Progress Notes (Signed)
Patient Care Team: Carlena Hurl, PA-C as PCP - General (Family Medicine)   HPI  Stephanie [name] is a 65 y.o. female Seen in followup for atrial fibrillation in the context of morbid obesity, sarcoid and mild left ventricular hypertrophy. Catheterization 2012 demonstrated nonobstructive coronary disease and normal left ventricular function and elevated filling pressures By echocardiogram, recurrent cardiomyopathy was demonstrated with EF 25-30% April 2014   She underwent cardioversion may/14 with the hopes of restoring sinus rhythm and seeing recovery of LV systolic function. She had rapidly reverted to afib and then spontaneously reverted to sinus rhythm. There has been  interval near normalization of LV systolic function confirmed by echo August 2014   She saw Dr. Greggory Brandy 3/15 to consider catheter ablation.  She was not interested. He reduced her amiodarone to 200 mg a day. Encouraged her to refrain from alcohol and to work on blood pressure and weight reduction.  She has been seeing neurology because of complaints of tremor and staggering.  The concern was whether he could be related to amiodarone  Toxicity and it was discontinued at the last visit.  Functional status is relatively stable. She has had no edema  She says that she drinks a little bit of wine or alcohol a couple of weeks.  6/17 TSH 1.9 ALT 9   .   Past Medical History:  Diagnosis Date  . Anemia   . Ankle fracture, right    x 2  . Antineoplastic and immunosuppressive drugs causing adverse effect in therapeutic use   . Asthma   . Chronic venous hypertension without complications   . Colon polyps   . Colon polyps 2009  . Coronary artery disease    no CAD by cath 11.2010  . Depressive disorder, not elsewhere classified   . Diastolic CHF, chronic ([place])   . Diverticulosis of colon 2009   colonoscopy  . Dyslipidemia   . Glucose intolerance (impaired glucose tolerance)    hgb AIC 6.1 09/02/2009  . Gout   .  Hepatitis, unspecified    hx of  . Hx of hysterectomy 2002  . Hyperlipidemia   . Hypertension   . Irritable bowel syndrome   . Lung nodule    31mm RLL nodule on CT Chest  07/2009, resolved by 2011 CT  . Noncompliance   . Obesity   . Persistent atrial fibrillation ([place])    s/p DCCV 07/2009; Pradaxa Rx  . PONV (postoperative nausea and vomiting)   . Sarcoidosis ([place])    dx by eye exam  . Sleep disturbance 06/2013   sleep study, mild abnormality, no criteria for CPAP    Past Surgical History:  Procedure Laterality Date  . ABDOMINAL HYSTERECTOMY    . CARDIOVERSION     x 2  . CARDIOVERSION N/A 02/27/2013   Procedure: CARDIOVERSION;  Surgeon: Lelon Perla, MD;  Location: [facility] ENDOSCOPY;  Service: Cardiovascular;  Laterality: N/A;  . CHOLECYSTECTOMY    . COLONOSCOPY     Diverticulosis, colon polyps, repeat 2014, Dr. Deatra Ina  . ESOPHAGOGASTRODUODENOSCOPY N/A 05/16/2014   Procedure: ESOPHAGOGASTRODUODENOSCOPY (EGD);  Surgeon: Wonda Horner, MD;  Location: [facility] ORS;  Service: Gastroenterology;  Laterality: N/A;  . HERNIA REPAIR  2009    Current Outpatient Prescriptions  Medication Sig Dispense Refill  . albuterol (PROVENTIL HFA;VENTOLIN HFA) 108 (90 Base) MCG/ACT inhaler Inhale 2 puffs into the lungs every 6 (six) hours as needed for wheezing or shortness of breath. 1 Inhaler 1  . allopurinol (  ZYLOPRIM) 300 MG tablet TAKE 1 TABLET BY MOUTH EVERY DAY 90 tablet 2  . ALPRAZolam (XANAX) 0.5 MG tablet Take 1 tablet (0.5 mg total) by mouth at bedtime as needed for anxiety. 30 tablet 1  . B Complex-C (B-COMPLEX WITH VITAMIN C) tablet Take 1 tablet by mouth daily. 30 tablet 5  . carvedilol (COREG) 6.25 MG tablet TAKE 1 TABLET(6.25 MG) BY MOUTH TWICE DAILY WITH A MEAL 180 tablet 1  . colchicine (COLCRYS) 0.6 MG tablet Take 1 tablet (0.6 mg total) by mouth daily as needed. Reported on 01/21/2016 45 tablet 1  . dabigatran (PRADAXA) 150 MG CAPS capsule Take 1 capsule (150 mg total) by mouth 2 (two)  times daily. 60 capsule 11  . FLUoxetine (PROZAC) 20 MG tablet Take 1 tablet (20 mg total) by mouth daily. 90 tablet 1  . furosemide (LASIX) 40 MG tablet TAKE 1 TABLET(40 MG) BY MOUTH DAILY AS NEEDED FOR FLUID RETENTION OR SWELLING 30 tablet 2  . metFORMIN (GLUCOPHAGE) 500 MG tablet Take 1 tablet (500 mg total) by mouth 2 (two) times daily with a meal. 1 tablet po BID for diabetes 180 tablet 1  . polyethylene glycol powder (GLYCOLAX/MIRALAX) powder Mix 17 grams in 8 oz of water 1-2 x daily 255 g 3  . potassium chloride SA (K-DUR,KLOR-CON) 20 MEQ tablet Take 1 tablet (20 mEq total) by mouth daily. 30 tablet 4  . simvastatin (ZOCOR) 10 MG tablet TAKE 1 TABLET(10 MG) BY MOUTH DAILY 30 tablet 11  . traZODone (DESYREL) 50 MG tablet TAKE 1/2 TO 1 TABLET BY MOUTH EVERY NIGHT AT BEDTIME AS NEEDED FOR SLEEP 90 tablet 3  . losartan (COZAAR) 25 MG tablet Take 1 tablet (25 mg total) by mouth daily. 90 tablet 3   No current facility-administered medications for this visit.     Allergies  Allergen Reactions  . Albuterol     Palpitations, intolerance  . Amiodarone     adverse reaction: Tremor/gait disurbance  . Atorvastatin     Body aches and pains--stiffness.   [name] [Pitavastatin]     Body aches  . Propoxyphene N-Acetaminophen Nausea And Vomiting    Review of Systems negative except from HPI and PMH  Physical Exam BP (!) 152/80   Pulse (!) 46   Ht 5\' 3"  (1.6 m)   Wt 266 lb 6.4 oz (120.8 kg)   SpO2 96%   BMI 47.19 kg/m  Well developed and well nourished in no acute distress HENT normal E scleral and icterus clear Neck Supple JVP flat; carotids brisk and full Clear to ausculation  Regular rate and rhythm, no murmurs gallops or rub Soft with active bowel sounds No clubbing cyanosis none Edema Alert and oriented, grossly normal motor and sensory function Skin Warm and Dry  ECG: Sinus Rhythm  @73             Intervals  16/10/43 Frequent PACs  Axis -16 LVH NSTT     Assessment and   Plan  Hypertension  better  Paroxysmal atrial fibrillation on amiodarone hoding sinus  Sarcoid  Obesity    Tremor  High risk medication surveillance  Continue on dabigitran  No bleeding issues  Under a great deal of stress

## 2016-07-06 ENCOUNTER — Other Ambulatory Visit: Payer: Self-pay | Admitting: Medical

## 2016-07-06 NOTE — Telephone Encounter (Signed)
Is this okay to refill? 

## 2016-07-30 ENCOUNTER — Other Ambulatory Visit: Payer: Self-pay | Admitting: Medical

## 2016-07-30 NOTE — Telephone Encounter (Signed)
Is this okay to refill? 

## 2016-08-12 ENCOUNTER — Encounter: Payer: Medicare Other | Admitting: Medical

## 2016-08-18 ENCOUNTER — Other Ambulatory Visit: Payer: Self-pay | Admitting: Medical

## 2016-08-18 ENCOUNTER — Other Ambulatory Visit: Payer: Self-pay

## 2016-08-18 NOTE — Telephone Encounter (Signed)
According to her chart, she is taking potassium 20 meq once daily. Please verify this is what she is taking, then ok to refill.  Thanks!

## 2016-08-20 ENCOUNTER — Ambulatory Visit (INDEPENDENT_AMBULATORY_CARE_PROVIDER_SITE_OTHER): Payer: Medicare Other | Admitting: Medical

## 2016-08-20 ENCOUNTER — Encounter: Payer: Self-pay | Admitting: Medical

## 2016-08-20 VITALS — BP 144/82 | HR 77 | Wt 265.0 lb

## 2016-08-20 DIAGNOSIS — G4700 Insomnia, unspecified: Secondary | ICD-10-CM

## 2016-08-20 DIAGNOSIS — I10 Essential (primary) hypertension: Secondary | ICD-10-CM | POA: Diagnosis not present

## 2016-08-20 DIAGNOSIS — F411 Generalized anxiety disorder: Secondary | ICD-10-CM

## 2016-08-20 DIAGNOSIS — E118 Type 2 diabetes mellitus with unspecified complications: Secondary | ICD-10-CM | POA: Diagnosis not present

## 2016-08-20 DIAGNOSIS — E785 Hyperlipidemia, unspecified: Secondary | ICD-10-CM

## 2016-08-20 DIAGNOSIS — Z862 Personal history of diseases of the blood and blood-forming organs and certain disorders involving the immune mechanism: Secondary | ICD-10-CM

## 2016-08-20 DIAGNOSIS — Z23 Encounter for immunization: Secondary | ICD-10-CM | POA: Diagnosis not present

## 2016-08-20 DIAGNOSIS — I5032 Chronic diastolic (congestive) heart failure: Secondary | ICD-10-CM

## 2016-08-20 LAB — HEMOGLOBIN A1C
Hgb A1c MFr Bld: 6.3 % — ABNORMAL HIGH (ref <5.7)
MEAN PLASMA GLUCOSE: 134 mg/dL

## 2016-08-20 MED ORDER — ALPRAZOLAM 1 MG PO TABS: 1.0000 mg | ORAL_TABLET | Freq: Every evening | ORAL | 1 refills | Status: DC | PRN

## 2016-08-20 NOTE — Telephone Encounter (Signed)
Called and spoke to patient and she stated she does not need a refill on her potassium at this time and she will call us when she needs a refill.

## 2016-08-20 NOTE — Progress Notes (Signed)
Subjective: Chief Complaint  Patient presents with  . follow up    patient wants a flu shot , med check    Here for routine f/u, med check.  She wants flu and pneumonia shot but has several questions about these today.   Thinks flu shot made her sick last year.   Still has problems with sleep.  Takes either trazodone or Ambien prn, but neither daily.  She stresses about a lot of things.   Takes naps in the daytime.  Gets sometimes scared in the evening.  worries about people stealing her stuff, worries about all her stuff, worries about her grand child. Her son lives with her but he isn't home all that much.  Is compliant with medications as noted today  Takes col crys prn not daily.    Takes xanax prn, but has been out for months  All other medications are daily as noted  Exercise some - bike stationary  Past Medical History:  Diagnosis Date  . Anemia   . Ankle fracture, right    x 2  . Antineoplastic and immunosuppressive drugs causing adverse effect in therapeutic use   . Asthma   . Chronic venous hypertension without complications   . Colon polyps   . Colon polyps 2009  . Coronary artery disease    no CAD by cath 11.2010  . Depressive disorder, not elsewhere classified   . Diastolic CHF, chronic ([place])   . Diverticulosis of colon 2009   colonoscopy  . Dyslipidemia   . Glucose intolerance (impaired glucose tolerance)    hgb AIC 6.1 09/02/2009  . Gout   . Hepatitis, unspecified    hx of  . Hx of hysterectomy 2002  . Hyperlipidemia   . Hypertension   . Irritable bowel syndrome   . Lung nodule    70mm RLL nodule on CT Chest  07/2009, resolved by 2011 CT  . Noncompliance   . Obesity   . Persistent atrial fibrillation ([place])    s/p DCCV 07/2009; Pradaxa Rx  . PONV (postoperative nausea and vomiting)   . Sarcoidosis ([place])    dx by eye exam  . Sleep disturbance 06/2013   sleep study, mild abnormality, no criteria for CPAP   Current Outpatient Prescriptions on File  Prior to Visit  Medication Sig Dispense Refill  . allopurinol (ZYLOPRIM) 300 MG tablet TAKE 1 TABLET BY MOUTH EVERY DAY 90 tablet 2  . carvedilol (COREG) 6.25 MG tablet TAKE 1 TABLET(6.25 MG) BY MOUTH TWICE DAILY WITH A MEAL 180 tablet 1  . dabigatran (PRADAXA) 150 MG CAPS capsule Take 1 capsule (150 mg total) by mouth 2 (two) times daily. 60 capsule 11  . FLUoxetine (PROZAC) 20 MG tablet Take 1 tablet (20 mg total) by mouth daily. 90 tablet 1  . furosemide (LASIX) 40 MG tablet TAKE 1 TABLET(40 MG) BY MOUTH DAILY AS NEEDED FOR FLUID RETENTION OR SWELLING 30 tablet 1  . losartan (COZAAR) 25 MG tablet Take 1 tablet (25 mg total) by mouth daily. 90 tablet 3  . metFORMIN (GLUCOPHAGE) 500 MG tablet Take 1 tablet (500 mg total) by mouth 2 (two) times daily with a meal. 1 tablet po BID for diabetes 180 tablet 1  . polyethylene glycol powder (GLYCOLAX/MIRALAX) powder Mix 17 grams in 8 oz of water 1-2 x daily 255 g 3  . potassium chloride SA (K-DUR,KLOR-CON) 20 MEQ tablet Take 1 tablet (20 mEq total) by mouth daily. 30 tablet 4  . simvastatin (ZOCOR) 10  MG tablet TAKE 1 TABLET(10 MG) BY MOUTH DAILY 30 tablet 11  . traZODone (DESYREL) 50 MG tablet TAKE 1/2 TO 1 TABLET BY MOUTH EVERY NIGHT AT BEDTIME AS NEEDED FOR SLEEP 90 tablet 3  . VENTOLIN HFA 108 (90 Base) MCG/ACT inhaler INHALE 2 PUFFS INTO THE LUNGS EVERY 6 HOURS AS NEEDED FOR WHEEZING OR SHORTNESS OF BREATH 18 g 0  . ALPRAZolam (XANAX) 0.5 MG tablet Take 1 tablet (0.5 mg total) by mouth at bedtime as needed for anxiety. (Patient not taking: Reported on 08/20/2016) 30 tablet 1  . B Complex-C (B-COMPLEX WITH VITAMIN C) tablet Take 1 tablet by mouth daily. (Patient not taking: Reported on 08/20/2016) 30 tablet 5  . colchicine (COLCRYS) 0.6 MG tablet Take 1 tablet (0.6 mg total) by mouth daily as needed. Reported on 01/21/2016 (Patient not taking: Reported on 08/20/2016) 45 tablet 1  . [DISCONTINUED] propranolol (INDERAL) 60 MG tablet Take 60 mg by mouth daily.        No current facility-administered medications on file prior to visit.    Past Surgical History:  Procedure Laterality Date  . ABDOMINAL HYSTERECTOMY    . CARDIOVERSION     x 2  . CARDIOVERSION N/A 02/27/2013   Procedure: CARDIOVERSION;  Surgeon: Lelon Perla, MD;  Location: [facility] ENDOSCOPY;  Service: Cardiovascular;  Laterality: N/A;  . CHOLECYSTECTOMY    . COLONOSCOPY     Diverticulosis, colon polyps, repeat 2014, Dr. Deatra Ina  . ESOPHAGOGASTRODUODENOSCOPY N/A 05/16/2014   Procedure: ESOPHAGOGASTRODUODENOSCOPY (EGD);  Surgeon: Wonda Horner, MD;  Location: [facility] ORS;  Service: Gastroenterology;  Laterality: N/A;  . HERNIA REPAIR  2009    Objective: BP (!) 144/82   Pulse 77   Wt 265 lb (120.2 kg)   SpO2 97%   BMI 46.94 kg/m    General appearance: alert, no distress, WD/WN Neck: supple, no lymphadenopathy, no thyromegaly, no masses, no bruits Heart: RRR, normal S1, S2, no murmurs Lungs: CTA bilaterally, no wheezes, rhonchi, or rales Abdomen: +bs, soft, non tender, non distended, no masses, no hepatomegaly, no splenomegaly Pulses: 2+ symmetric, upper and lower extremities, normal cap refill Ext:no edmea Psych: pleasant, answers questions appropriately   Assessment: Encounter Diagnoses  Name Primary?  . Generalized anxiety disorder Yes  . Type 2 diabetes mellitus with complication, without long-term current use of insulin ([place])   . Essential hypertension   . CHRONIC DIASTOLIC HEART FAILURE   . History of anemia   . Dyslipidemia   . Insomnia, unspecified type   . Need for prophylactic vaccination and inoculation against influenza   . Need for prophylactic vaccination against Streptococcus pneumoniae (pneumococcus)   . Need for pneumococcal vaccine    Plan: Anxiety, insomnia - c/t Prozac 20mg .  She did not want this adjusted today.  advised counseling but she seems reluctant to go.  discussed dealing with anxiety, stressors.  Increase xanax to 1mg , but still prn use.     Discussed sleep hygiene, prior and current sleep aids.   She is just using trazodone prn, occasionally uses Ambien prn.  I advised no day time naps which is also contributing to the problem.  I advised trazodone daily but she doesn't want to do this.   For now she will just use trazodone prn and work on sleep hygeine as discussed  C/t rest of medications as usual, labs today  F/u with cardiology as scheduled   Counseled on the influenza virus vaccine.  Vaccine information sheet given.  Influenza vaccine given after  consent obtained.  Counseled on the pneumococcal vaccine.  Vaccine information sheet given.  Pneumococcal vaccine PPSV23 given after consent obtained given hx/o heart disease, asthma.  She will be due for Prevnar 13 at age 32yo.Stephanie [name]  Deidrea was seen today for follow up.  Diagnoses and all orders for this visit:  Generalized anxiety disorder  Type 2 diabetes mellitus with complication, without long-term current use of insulin (H[facility]) -     Lipid panel -     Comprehensive metabolic panel -     Hemoglobin A1c  Essential hypertension -     Comprehensive metabolic panel  CHRONIC DIASTOLIC HEART FAILURE  History of anemia  Dyslipidemia  Insomnia, unspecified type  Need for prophylactic vaccination and inoculation against influenza  Need for prophylactic vaccination against Streptococcus pneumoniae (pneumococcus)  Need for pneumococcal vaccine -     Cancel: Pneumococcal polysaccharide vaccine 23-valent greater than or equal to 2yo subcutaneous/IM -     Pneumococcal polysaccharide vaccine 23-valent greater than or equal to 2yo subcutaneous/IM  Other orders -     ALPRAZolam (XANAX) 1 MG tablet; Take 1 tablet (1 mg total) by mouth at bedtime as needed for anxiety.

## 2016-08-21 ENCOUNTER — Other Ambulatory Visit: Payer: Self-pay | Admitting: Medical

## 2016-08-21 LAB — LIPID PANEL
CHOL/HDL RATIO: 3.7 ratio (ref <=5.0)
CHOLESTEROL: 221 mg/dL — AB (ref 125–200)
HDL: 60 mg/dL (ref >=46)
LDL Cholesterol: 140 mg/dL — ABNORMAL HIGH (ref <130)
TRIGLYCERIDES: 103 mg/dL (ref <150)
VLDL: 21 mg/dL (ref <30)

## 2016-08-21 LAB — COMPREHENSIVE METABOLIC PANEL
ALBUMIN: 4.2 g/dL (ref 3.6–5.1)
ALT: 8 U/L (ref 6–29)
AST: 12 U/L (ref 10–35)
Alkaline Phosphatase: 74 U/L (ref 33–130)
BUN: 23 mg/dL (ref 7–25)
CALCIUM: 9.9 mg/dL (ref 8.6–10.4)
CHLORIDE: 104 mmol/L (ref 98–110)
CO2: 26 mmol/L (ref 20–31)
Creat: 1.12 mg/dL — ABNORMAL HIGH (ref 0.50–0.99)
Glucose, Bld: 138 mg/dL — ABNORMAL HIGH (ref 65–99)
Potassium: 4 mmol/L (ref 3.5–5.3)
Sodium: 141 mmol/L (ref 135–146)
Total Bilirubin: 0.6 mg/dL (ref 0.2–1.2)
Total Protein: 7.7 g/dL (ref 6.1–8.1)

## 2016-08-21 MED ORDER — ROSUVASTATIN CALCIUM 20 MG PO TABS: 20.0000 mg | ORAL_TABLET | Freq: Every day | ORAL | 3 refills | Status: DC

## 2016-08-24 ENCOUNTER — Telehealth: Payer: Self-pay

## 2016-08-24 MED ORDER — POTASSIUM CHLORIDE CRYS ER 20 MEQ PO TBCR: 20.0000 meq | EXTENDED_RELEASE_TABLET | Freq: Every day | ORAL | 8 refills | Status: DC

## 2016-08-24 NOTE — Telephone Encounter (Signed)
Fax refill request for Potassium 20 mEq - Take 1 tab every OTHER day. According to [place] 06/18/16 pt was on 20 mEq once daily and no changes were made to this medication (see below).  Sent in Rx today for 20 mEq once daily    Current Outpatient Prescriptions  Medication Sig Dispense Refill  . albuterol (PROVENTIL HFA;VENTOLIN HFA) 108 (90 Base) MCG/ACT inhaler Inhale 2 puffs into the lungs every 6 (six) hours as needed for wheezing or shortness of breath. 1 Inhaler 1  . allopurinol (ZYLOPRIM) 300 MG tablet TAKE 1 TABLET BY MOUTH EVERY DAY 90 tablet 2  . ALPRAZolam (XANAX) 0.5 MG tablet Take 1 tablet (0.5 mg total) by mouth at bedtime as needed for anxiety. 30 tablet 1  . B Complex-C (B-COMPLEX WITH VITAMIN C) tablet Take 1 tablet by mouth daily. 30 tablet 5  . carvedilol (COREG) 6.25 MG tablet TAKE 1 TABLET(6.25 MG) BY MOUTH TWICE DAILY WITH A MEAL 180 tablet 1  . colchicine (COLCRYS) 0.6 MG tablet Take 1 tablet (0.6 mg total) by mouth daily as needed. Reported on 01/21/2016 45 tablet 1  . dabigatran (PRADAXA) 150 MG CAPS capsule Take 1 capsule (150 mg total) by mouth 2 (two) times daily. 60 capsule 11  . FLUoxetine (PROZAC) 20 MG tablet Take 1 tablet (20 mg total) by mouth daily. 90 tablet 1  . furosemide (LASIX) 40 MG tablet TAKE 1 TABLET(40 MG) BY MOUTH DAILY AS NEEDED FOR FLUID RETENTION OR SWELLING 30 tablet 2  . metFORMIN (GLUCOPHAGE) 500 MG tablet Take 1 tablet (500 mg total) by mouth 2 (two) times daily with a meal. 1 tablet po BID for diabetes 180 tablet 1  . polyethylene glycol powder (GLYCOLAX/MIRALAX) powder Mix 17 grams in 8 oz of water 1-2 x daily 255 g 3  . potassium chloride SA (K-DUR,KLOR-CON) 20 MEQ tablet Take 1 tablet (20 mEq total) by mouth daily. 30 tablet 4  . simvastatin (ZOCOR) 10 MG tablet TAKE 1 TABLET(10 MG) BY MOUTH DAILY 30 tablet 11  . traZODone (DESYREL) 50 MG tablet TAKE 1/2 TO 1 TABLET BY MOUTH EVERY NIGHT AT BEDTIME AS NEEDED FOR SLEEP 90 tablet 3  . losartan  (COZAAR) 25 MG tablet Take 1 tablet (25 mg total) by mouth daily. 90 tablet 3   No current facility-administered medications for this visit.          Allergies  Allergen Reactions  . Albuterol     Palpitations, intolerance  . Amiodarone     adverse reaction: Tremor/gait disurbance  . Atorvastatin     Body aches and pains--stiffness.   [name] [Pitavastatin]     Body aches  . Propoxyphene N-Acetaminophen Nausea And Vomiting    Review of Systems negative except from HPI and PMH  Physical Exam BP (!) 152/80   Pulse (!) 46   Ht 5\' 3"  (1.6 m)   Wt 266 lb 6.4 oz (120.8 kg)   SpO2 96%   BMI 47.19 kg/m  Well developed and well nourished in no acute distress HENT normal E scleral and icterus clear Neck Supple JVP flat; carotids brisk and full Clear to ausculation  Regular rate and rhythm, no murmurs gallops or rub Soft with active bowel sounds No clubbing cyanosis none Edema Alert and oriented, grossly normal motor and sensory function Skin Warm and Dry  ECG: Sinus Rhythm  @73               Intervals  16/10/43 Frequent PACs  Axis -16 LVH NSTT                      Assessment and  Plan  Hypertension  better  Paroxysmal atrial fibrillation on amiodarone hoding sinus  Sarcoid  Obesity    Tremor  High risk medication surveillance  Continue on dabigitran  No bleeding issues  Under a great deal of stress       Referring Provider   Carlena Hurl, PA-C      Diagnoses    Codes Comments  Paroxysmal atrial fibrillation ([place]) - Primary ICD-9-CM: 427.31 ICD-10-CM: I48.0        Reason for Visit   Reason for Visit History    Ordered Medications    Disp Refills Start End  losartan (COZAAR) 25 MG tablet 90 tablet 3 06/18/2016 09/16/2016  Take 1 tablet (25 mg total) by mouth daily. - Oral  Discontinued Medications    Reason for Discontinue  cloNIDine (CATAPRES) 0.2 MG tablet Discontinued by provider   simvastatin (ZOCOR) 20 MG tablet Duplicate  Level of Service   Level of Service  PR OFFICE OUTPATIENT VISIT 25 MINUTES [99214]     All Charges for This Encounter   Code Description Service Date Service Provider Modifiers Qty  [place] OUTPATIENT VISIT 25 MINUTES 06/18/2016 Deboraha Sprang, MD  1  [phone] PR CURRENT TOBACCO NON-USER 06/18/2016 Deboraha Sprang, MD  1  [id number] PR ELECTROCARDIOGRAM, COMPLETE 06/18/2016 Deboraha Sprang, MD  1  AVS Reports   Date/Time Report Action User  06/18/2016 2:20 PM After Visit Summary Printed Emily Filbert, RN  06/18/2016 2:17 PM After Visit Summary Printed Emily Filbert, RN  Patient Instructions   Medication Instructions: - Your physician has recommended you make the following change in your medication:  1) When you finish your current supply of clonidine, stop this medication 2) you will then start losartan 25 mg once daily  Labwork: - none   Procedures/Testing: - none  Follow-Up: - Your physician wants you to follow-up in: 6 months with Dr. [name]. You will receive a reminder letter in the mail two months in advance. If you don't receive a letter, please call our office to schedule the follow-up appointment.  Any Additional Special Instructions Will Be Listed Below (If Applicable).     If you need a refill on your cardiac medications before your next appointment, please call your pharmacy.

## 2016-08-27 ENCOUNTER — Other Ambulatory Visit: Payer: Self-pay | Admitting: Medical

## 2016-09-04 ENCOUNTER — Other Ambulatory Visit: Payer: Medicare Other

## 2016-09-04 ENCOUNTER — Ambulatory Visit: Payer: Medicare Other | Admitting: Hematology

## 2016-09-15 ENCOUNTER — Other Ambulatory Visit (HOSPITAL_BASED_OUTPATIENT_CLINIC_OR_DEPARTMENT_OTHER): Payer: Medicare Other

## 2016-09-15 ENCOUNTER — Ambulatory Visit (HOSPITAL_BASED_OUTPATIENT_CLINIC_OR_DEPARTMENT_OTHER): Payer: Medicare Other | Admitting: Hematology

## 2016-09-15 ENCOUNTER — Encounter: Payer: Self-pay | Admitting: Hematology

## 2016-09-15 VITALS — BP 155/75 | HR 86 | Temp 97.8°F | Resp 18 | Ht 63.0 in | Wt 260.0 lb

## 2016-09-15 DIAGNOSIS — E876 Hypokalemia: Secondary | ICD-10-CM

## 2016-09-15 DIAGNOSIS — I5033 Acute on chronic diastolic (congestive) heart failure: Secondary | ICD-10-CM

## 2016-09-15 DIAGNOSIS — E538 Deficiency of other specified B group vitamins: Secondary | ICD-10-CM

## 2016-09-15 DIAGNOSIS — E119 Type 2 diabetes mellitus without complications: Secondary | ICD-10-CM

## 2016-09-15 DIAGNOSIS — D649 Anemia, unspecified: Secondary | ICD-10-CM

## 2016-09-15 LAB — COMPREHENSIVE METABOLIC PANEL
ALT: 20 U/L (ref 0–55)
ANION GAP: 11 meq/L (ref 3–11)
AST: 19 U/L (ref 5–34)
Albumin: 3.7 g/dL (ref 3.5–5.0)
Alkaline Phosphatase: 95 U/L (ref 40–150)
BUN: 31.4 mg/dL — ABNORMAL HIGH (ref 7.0–26.0)
CHLORIDE: 103 meq/L (ref 98–109)
CO2: 28 meq/L (ref 22–29)
Calcium: 10.2 mg/dL (ref 8.4–10.4)
Creatinine: 1.3 mg/dL — ABNORMAL HIGH (ref 0.6–1.1)
EGFR: 48 mL/min/{1.73_m2} — AB (ref >90)
Glucose: 196 mg/dl — ABNORMAL HIGH (ref 70–140)
Potassium: 3.8 mEq/L (ref 3.5–5.1)
Sodium: 142 mEq/L (ref 136–145)
Total Bilirubin: 0.47 mg/dL (ref 0.20–1.20)
Total Protein: 8.3 g/dL (ref 6.4–8.3)

## 2016-09-15 LAB — CBC & DIFF AND RETIC
BASO%: 0.1 % (ref 0.0–2.0)
Basophils Absolute: 0 10*3/uL (ref 0.0–0.1)
EOS%: 1.7 % (ref 0.0–7.0)
Eosinophils Absolute: 0.1 10*3/uL (ref 0.0–0.5)
HCT: 37.5 % (ref 34.8–46.6)
HGB: 12.2 g/dL (ref 11.6–15.9)
Immature Retic Fract: 9.7 % (ref 1.60–10.00)
LYMPH#: 3.2 10*3/uL (ref 0.9–3.3)
LYMPH%: 38.7 % (ref 14.0–49.7)
MCH: 30.1 pg (ref 25.1–34.0)
MCHC: 32.5 g/dL (ref 31.5–36.0)
MCV: 92.6 fL (ref 79.5–101.0)
MONO#: 0.6 10*3/uL (ref 0.1–0.9)
MONO%: 7.7 % (ref 0.0–14.0)
NEUT#: 4.3 10*3/uL (ref 1.5–6.5)
NEUT%: 51.8 % (ref 38.4–76.8)
PLATELETS: 214 10*3/uL (ref 145–400)
RBC: 4.05 10*6/uL (ref 3.70–5.45)
RDW: 14.8 % — ABNORMAL HIGH (ref 11.2–14.5)
RETIC CT ABS: 108.54 10*3/uL — AB (ref 33.70–90.70)
Retic %: 2.68 % — ABNORMAL HIGH (ref 0.70–2.10)
WBC: 8.3 10*3/uL (ref 3.9–10.3)

## 2016-09-16 LAB — VITAMIN B12: Vitamin B12: 435 pg/mL (ref 211–946)

## 2016-09-22 ENCOUNTER — Other Ambulatory Visit: Payer: Self-pay | Admitting: Medical

## 2016-10-15 ENCOUNTER — Other Ambulatory Visit: Payer: Self-pay | Admitting: Medical

## 2016-10-15 NOTE — Telephone Encounter (Signed)
Is this okay to refill? 

## 2016-10-17 ENCOUNTER — Inpatient Hospital Stay (HOSPITAL_COMMUNITY)
Admission: EM | Admit: 2016-10-17 | Discharge: 2016-10-20 | DRG: 308 | Disposition: A | Discharge status: Home / Self Care | Payer: Medicare Other | Attending: Family Medicine | Admitting: Family Medicine

## 2016-10-17 ENCOUNTER — Emergency Department (HOSPITAL_COMMUNITY): Payer: Medicare Other

## 2016-10-17 ENCOUNTER — Encounter (HOSPITAL_COMMUNITY): Payer: Self-pay | Admitting: Emergency Medicine

## 2016-10-17 DIAGNOSIS — Z6841 Body Mass Index (BMI) 40.0 and over, adult: Secondary | ICD-10-CM | POA: Diagnosis not present

## 2016-10-17 DIAGNOSIS — I87309 Chronic venous hypertension (idiopathic) without complications of unspecified lower extremity: Secondary | ICD-10-CM | POA: Diagnosis present

## 2016-10-17 DIAGNOSIS — Z7984 Long term (current) use of oral hypoglycemic drugs: Secondary | ICD-10-CM

## 2016-10-17 DIAGNOSIS — Z86010 Personal history of colonic polyps: Secondary | ICD-10-CM

## 2016-10-17 DIAGNOSIS — I5021 Acute systolic (congestive) heart failure: Secondary | ICD-10-CM

## 2016-10-17 DIAGNOSIS — I481 Persistent atrial fibrillation: Principal | ICD-10-CM | POA: Diagnosis present

## 2016-10-17 DIAGNOSIS — I5023 Acute on chronic systolic (congestive) heart failure: Secondary | ICD-10-CM | POA: Diagnosis present

## 2016-10-17 DIAGNOSIS — K589 Irritable bowel syndrome without diarrhea: Secondary | ICD-10-CM | POA: Diagnosis present

## 2016-10-17 DIAGNOSIS — I110 Hypertensive heart disease with heart failure: Secondary | ICD-10-CM | POA: Diagnosis not present

## 2016-10-17 DIAGNOSIS — E662 Morbid (severe) obesity with alveolar hypoventilation: Secondary | ICD-10-CM | POA: Diagnosis present

## 2016-10-17 DIAGNOSIS — T462X5A Adverse effect of other antidysrhythmic drugs, initial encounter: Secondary | ICD-10-CM | POA: Diagnosis present

## 2016-10-17 DIAGNOSIS — D869 Sarcoidosis, unspecified: Secondary | ICD-10-CM | POA: Diagnosis present

## 2016-10-17 DIAGNOSIS — Z888 Allergy status to other drugs, medicaments and biological substances status: Secondary | ICD-10-CM

## 2016-10-17 DIAGNOSIS — I5022 Chronic systolic (congestive) heart failure: Secondary | ICD-10-CM | POA: Diagnosis present

## 2016-10-17 DIAGNOSIS — Z794 Long term (current) use of insulin: Secondary | ICD-10-CM | POA: Diagnosis present

## 2016-10-17 DIAGNOSIS — E785 Hyperlipidemia, unspecified: Secondary | ICD-10-CM | POA: Diagnosis present

## 2016-10-17 DIAGNOSIS — E876 Hypokalemia: Secondary | ICD-10-CM | POA: Diagnosis present

## 2016-10-17 DIAGNOSIS — J45909 Unspecified asthma, uncomplicated: Secondary | ICD-10-CM | POA: Diagnosis present

## 2016-10-17 DIAGNOSIS — I480 Paroxysmal atrial fibrillation: Secondary | ICD-10-CM | POA: Diagnosis not present

## 2016-10-17 DIAGNOSIS — Z820 Family history of epilepsy and other diseases of the nervous system: Secondary | ICD-10-CM

## 2016-10-17 DIAGNOSIS — I4891 Unspecified atrial fibrillation: Secondary | ICD-10-CM | POA: Diagnosis not present

## 2016-10-17 DIAGNOSIS — E0590 Thyrotoxicosis, unspecified without thyrotoxic crisis or storm: Secondary | ICD-10-CM | POA: Diagnosis present

## 2016-10-17 DIAGNOSIS — I428 Other cardiomyopathies: Secondary | ICD-10-CM | POA: Diagnosis not present

## 2016-10-17 DIAGNOSIS — I5043 Acute on chronic combined systolic (congestive) and diastolic (congestive) heart failure: Secondary | ICD-10-CM | POA: Diagnosis not present

## 2016-10-17 DIAGNOSIS — IMO0001 Reserved for inherently not codable concepts without codable children: Secondary | ICD-10-CM | POA: Diagnosis present

## 2016-10-17 DIAGNOSIS — I10 Essential (primary) hypertension: Secondary | ICD-10-CM | POA: Diagnosis present

## 2016-10-17 DIAGNOSIS — Z809 Family history of malignant neoplasm, unspecified: Secondary | ICD-10-CM

## 2016-10-17 DIAGNOSIS — R0602 Shortness of breath: Secondary | ICD-10-CM | POA: Diagnosis not present

## 2016-10-17 DIAGNOSIS — E119 Type 2 diabetes mellitus without complications: Secondary | ICD-10-CM | POA: Diagnosis present

## 2016-10-17 LAB — CBC WITH DIFFERENTIAL/PLATELET
Basophils Absolute: 0 10*3/uL (ref 0.0–0.1)
Basophils Relative: 0 %
EOS ABS: 0.1 10*3/uL (ref 0.0–0.7)
EOS PCT: 1 %
HCT: 33.8 % — ABNORMAL LOW (ref 36.0–46.0)
Hemoglobin: 11.4 g/dL — ABNORMAL LOW (ref 12.0–15.0)
LYMPHS ABS: 2.9 10*3/uL (ref 0.7–4.0)
Lymphocytes Relative: 38 %
MCH: 30.8 pg (ref 26.0–34.0)
MCHC: 33.7 g/dL (ref 30.0–36.0)
MCV: 91.4 fL (ref 78.0–100.0)
MONOS PCT: 7 %
Monocytes Absolute: 0.5 10*3/uL (ref 0.1–1.0)
Neutro Abs: 4.3 10*3/uL (ref 1.7–7.7)
Neutrophils Relative %: 54 %
PLATELETS: 221 10*3/uL (ref 150–400)
RBC: 3.7 MIL/uL — ABNORMAL LOW (ref 3.87–5.11)
RDW: 14.7 % (ref 11.5–15.5)
WBC: 7.7 10*3/uL (ref 4.0–10.5)

## 2016-10-17 LAB — COMPREHENSIVE METABOLIC PANEL
ALK PHOS: 63 U/L (ref 38–126)
ALT: 21 U/L (ref 14–54)
ANION GAP: 9 (ref 5–15)
AST: 19 U/L (ref 15–41)
Albumin: 3.6 g/dL (ref 3.5–5.0)
BUN: 18 mg/dL (ref 6–20)
CO2: 27 mmol/L (ref 22–32)
Calcium: 9.3 mg/dL (ref 8.9–10.3)
Chloride: 104 mmol/L (ref 101–111)
Creatinine, Ser: 0.82 mg/dL (ref 0.44–1.00)
GFR calc non Af Amer: >60 mL/min (ref >60)
Glucose, Bld: 155 mg/dL — ABNORMAL HIGH (ref 65–99)
Potassium: 3.3 mmol/L — ABNORMAL LOW (ref 3.5–5.1)
SODIUM: 140 mmol/L (ref 135–145)
Total Bilirubin: 0.7 mg/dL (ref 0.3–1.2)
Total Protein: 7.1 g/dL (ref 6.5–8.1)

## 2016-10-17 LAB — BRAIN NATRIURETIC PEPTIDE: B NATRIURETIC PEPTIDE 5: 722.6 pg/mL — AB (ref 0.0–100.0)

## 2016-10-17 LAB — I-STAT TROPONIN, ED: TROPONIN I, POC: 0.03 ng/mL (ref 0.00–0.08)

## 2016-10-17 MED ORDER — FUROSEMIDE 10 MG/ML IJ SOLN
40.0000 mg | Freq: Once | INTRAMUSCULAR | Status: AC
  Administered 2016-10-18: 40 mg via INTRAVENOUS
  Filled 2016-10-17: qty 4

## 2016-10-17 MED ORDER — DILTIAZEM HCL 25 MG/5ML IV SOLN
20.0000 mg | Freq: Once | INTRAVENOUS | Status: AC
Start: 2016-10-17 — End: 2016-10-17
  Administered 2016-10-17: 20 mg via INTRAVENOUS
  Filled 2016-10-17: qty 5

## 2016-10-17 MED ORDER — DEXTROSE 5 % IV SOLN
5.0000 mg/h | Freq: Once | INTRAVENOUS | Status: AC
  Administered 2016-10-18: 5 mg/h via INTRAVENOUS
  Filled 2016-10-17: qty 100

## 2016-10-17 NOTE — ED Provider Notes (Signed)
[facility] DEPT Provider Note   CSN: [id number] Arrival date & time: 10/17/16  2215     History   Chief Complaint Chief Complaint  Patient presents with  . Shortness of Breath    HPI Stephanie [name] is a 65 y.o. female history of anemia, A. fib on Pradaxa, diastolic heart failure here with short of breath. Patient states that she's been coughing and short of breath for the last week or so. She has been using her albuterol inhaler and that gets her heart rate faster.  Also has some lateral leg swelling as well.   The history is provided by the patient.    Past Medical History:  Diagnosis Date  . Anemia   . Ankle fracture, right    x 2  . Antineoplastic and immunosuppressive drugs causing adverse effect in therapeutic use   . Asthma   . Atrial fibrillation ([place])   . Chronic venous hypertension without complications   . Colon polyps   . Colon polyps 2009  . Coronary artery disease    no CAD by cath 11.2010  . Depressive disorder, not elsewhere classified   . Diastolic CHF, chronic ([place])   . Diverticulosis of colon 2009   colonoscopy  . Dyslipidemia   . Glucose intolerance (impaired glucose tolerance)    hgb AIC 6.1 09/02/2009  . Gout   . Hepatitis, unspecified    hx of  . Hx of hysterectomy 2002  . Hyperlipidemia   . Hypertension   . Irritable bowel syndrome   . Lung nodule    4mm RLL nodule on CT Chest  07/2009, resolved by 2011 CT  . Noncompliance   . Obesity   . Persistent atrial fibrillation ([place])    s/p DCCV 07/2009; Pradaxa Rx  . PONV (postoperative nausea and vomiting)   . Sarcoidosis ([place])    dx by eye exam  . Sleep disturbance 06/2013   sleep study, mild abnormality, no criteria for CPAP    Patient Active Problem List   Diagnosis Date Noted  . Need for prophylactic vaccination against Streptococcus pneumoniae (pneumococcus) 08/20/2016  . Type 2 diabetes mellitus with complication, without long-term current use of insulin ([place]) 05/12/2016  .  Edema 05/12/2016  . B12 deficiency 05/06/2016  . Normocytic anemia 04/15/2016  . Insomnia 01/21/2016  . Pelvic pain in female 08/06/2015  . Screening for breast cancer 08/06/2015  . History of anemia 08/02/2015  . Other specified nutritional anemias 08/02/2015  . Urinary pain 08/02/2015  . Need for prophylactic vaccination and inoculation against influenza 08/02/2015  . Obesity 08/02/2015  . Dyslipidemia 08/02/2015  . History of sarcoidosis 08/02/2015  . Generalized anxiety disorder 08/02/2015  . Other secondary chronic gout 08/02/2015  . Abnormal thyroid blood test 08/02/2015  . Physical exam 08/02/2015  . Needs flu shot 08/02/2015  . Dysthymic disorder 08/02/2015  . Ataxia 08/22/2014  . Cardiomyopathy, secondary ([place]) 01/28/2011  . CHRONIC DIASTOLIC HEART FAILURE [id number]  . Essential hypertension 09/02/2009  . Atrial fibrillation ([place]) 08/20/2009  . Lung nodule 08/20/2009  . CHRONIC VENOUS HYPERTENSION WITHOUT COMPS 11/21/2007    Past Surgical History:  Procedure Laterality Date  . ABDOMINAL HYSTERECTOMY    . CARDIOVERSION     x 2  . CARDIOVERSION N/A 02/27/2013   Procedure: CARDIOVERSION;  Surgeon: Lelon Perla, MD;  Location: [facility] ENDOSCOPY;  Service: Cardiovascular;  Laterality: N/A;  . CHOLECYSTECTOMY    . COLONOSCOPY     Diverticulosis, colon polyps, repeat 2014, Dr. Deatra Ina  . ESOPHAGOGASTRODUODENOSCOPY  N/A 05/16/2014   Procedure: ESOPHAGOGASTRODUODENOSCOPY (EGD);  Surgeon: Wonda Horner, MD;  Location: [facility] ORS;  Service: Gastroenterology;  Laterality: N/A;  . HERNIA REPAIR  2009    OB History    No data available       Home Medications    Prior to Admission medications   Medication Sig Start Date End Date Taking? Authorizing Provider  allopurinol (ZYLOPRIM) 300 MG tablet TAKE 1 TABLET BY MOUTH EVERY DAY 11/18/15   Camelia Eng Tysinger, PA-C  ALPRAZolam Duanne Moron) 1 MG tablet Take 1 tablet (1 mg total) by mouth at bedtime as needed for anxiety. 08/20/16   Camelia Eng  Tysinger, PA-C  B Complex-C (B-COMPLEX WITH VITAMIN C) tablet Take 1 tablet by mouth daily. 08/22/14   Asencion Partridge Dohmeier, MD  carvedilol (COREG) 6.25 MG tablet TAKE 1 TABLET(6.25 MG) BY MOUTH TWICE DAILY WITH A MEAL 06/10/15   Deboraha Sprang, MD  colchicine (COLCRYS) 0.6 MG tablet Take 1 tablet (0.6 mg total) by mouth daily as needed. Reported on 01/21/2016 01/21/16   Camelia Eng Tysinger, PA-C  dabigatran (PRADAXA) 150 MG CAPS capsule Take 1 capsule (150 mg total) by mouth 2 (two) times daily. 12/16/15   Thompson Grayer, MD  FLUoxetine (PROZAC) 20 MG tablet Take 1 tablet (20 mg total) by mouth daily. 05/12/16   Camelia Eng Tysinger, PA-C  FLUoxetine (PROZAC) 20 MG tablet TAKE 1 TABLET(20 MG) BY MOUTH DAILY 10/15/16   Camelia Eng Tysinger, PA-C  furosemide (LASIX) 40 MG tablet TAKE 1 TABLET(40 MG) BY MOUTH DAILY AS NEEDED FOR FLUID RETENTION OR SWELLING 10/15/16   Camelia Eng Tysinger, PA-C  losartan (COZAAR) 25 MG tablet Take 1 tablet (25 mg total) by mouth daily. 06/18/16 09/16/16  Deboraha Sprang, MD  metFORMIN (GLUCOPHAGE) 500 MG tablet Take 1 tablet (500 mg total) by mouth 2 (two) times daily with a meal. 1 tablet po BID for diabetes 05/13/16   Camelia Eng Tysinger, PA-C  polyethylene glycol powder (GLYCOLAX/MIRALAX) powder Mix 17 grams in 8 oz of water 1-2 x daily 03/24/16   Manus Gunning, MD  potassium chloride SA (K-DUR,KLOR-CON) 20 MEQ tablet Take 1 tablet (20 mEq total) by mouth daily. 08/24/16   Deboraha Sprang, MD  rosuvastatin (CRESTOR) 20 MG tablet Take 1 tablet (20 mg total) by mouth daily. 08/21/16   Camelia Eng Tysinger, PA-C  traZODone (DESYREL) 50 MG tablet TAKE 1/2 TO 1 TABLET BY MOUTH EVERY NIGHT AT BEDTIME AS NEEDED FOR SLEEP 05/13/16   Camelia Eng Tysinger, PA-C  VENTOLIN HFA 108 (90 Base) MCG/ACT inhaler INHALE 2 PUFFS INTO THE LUNGS EVERY 6 HOURS AS NEEDED FOR WHEEZING OR SHORTNESS OF BREATH 09/22/16   Carlena Hurl, PA-C    Family History Family History  Problem Relation Age of Onset  . Pneumonia Father      deceased  . Hypertension Father   . Cancer Father   . Multiple sclerosis Mother     hx  . Emphysema Other     runs in the family    Social History Social History  Substance Use Topics  . Smoking status: Never Smoker  . Smokeless tobacco: Never Used  . Alcohol use 0.0 oz/week     Comment: drinks 1 glass of wine every 2 weeks on average (occasional)     Allergies   Albuterol; Amiodarone; Atorvastatin; Livalo [pitavastatin]; and Propoxyphene n-acetaminophen   Review of Systems Review of Systems  Respiratory: Positive for shortness of breath.   All other systems  reviewed and are negative.    Physical Exam Updated Vital Signs BP 171/97 (BP Location: Left Arm)   Pulse (!) 163   Temp 98.2 F (36.8 C) (Oral)   Resp 23   Ht 5\' 3"  (1.6 m)   Wt 260 lb (117.9 kg)   SpO2 94%   BMI 46.06 kg/m   Physical Exam  Constitutional:  Tachypneic, tachycardic   HENT:  Head: Normocephalic.  Eyes: EOM are normal. Pupils are equal, round, and reactive to light.  Neck: Normal range of motion. Neck supple.  Cardiovascular: Normal heart sounds.   Tachycardic, irregular   Pulmonary/Chest: Effort normal and breath sounds normal.  Abdominal: Soft. Bowel sounds are normal. She exhibits no distension. There is no tenderness. There is no guarding.  Musculoskeletal: Normal range of motion.  1+ edema bilateral legs   Neurological: She is alert.  Skin: Skin is warm.  Psychiatric: She has a normal mood and affect.  Nursing note and vitals reviewed.    ED Treatments / Results  Labs (all labs ordered are listed, but only abnormal results are displayed) Labs Reviewed  CBC WITH DIFFERENTIAL/PLATELET  COMPREHENSIVE METABOLIC PANEL  BRAIN NATRIURETIC PEPTIDE  I-STAT [place], ED    EKG  EKG Interpretation  Date/Time:  Saturday October 17 2016 22:24:06 EST Ventricular Rate:  132 PR Interval:    QRS Duration: 100 QT Interval:  295 QTC Calculation: 438 R Axis:   -12 Text  Interpretation:  afib vs flutter  LVH with secondary repolarization abnormality Confirmed by Jadelynn Boylan  MD, Beverlie Kurihara ([id number]) on 10/17/2016 10:58:44 PM       Radiology No results found.  Procedures Procedures (including critical care time)  CRITICAL CARE Performed by: Wandra Arthurs   Total critical care time: 30 minutes  Critical care time was exclusive of separately billable procedures and treating other patients.  Critical care was necessary to treat or prevent imminent or life-threatening deterioration.  Critical care was time spent personally by me on the following activities: development of treatment plan with patient and/or surrogate as well as nursing, discussions with consultants, evaluation of patient's response to treatment, examination of patient, obtaining history from patient or surrogate, ordering and performing treatments and interventions, ordering and review of laboratory studies, ordering and review of radiographic studies, pulse oximetry and re-evaluation of patient's condition.   Medications Ordered in ED Medications  diltiazem (CARDIZEM) injection 20 mg (not administered)     Initial Impression / Assessment and Plan / ED Course  I have reviewed the triage vital signs and the nursing notes.  Pertinent labs & imaging results that were available during my care of the patient were reviewed by me and considered in my medical decision making (see chart for details).  Clinical Course    Rennae Waldner is a 65 y.o. female here with cough, shortness of breath palpitations. She is in rapid afib, also appears volume overloaded and concerned for possible CHF exacerbation. Will get labs, BNP, CXR.   12:37 AM BNP 722. Trop 0.03. CXR showed mild atelectasis. Given cardizem bolus and started on a drip. Given lasix as well. Called cardiology, who will see patient in the morning. Hospitalist to admit.     Final Clinical Impressions(s) / ED Diagnoses   Final diagnoses:  None     New Prescriptions New Prescriptions   No medications on file     Drenda Freeze, MD 10/18/16 0040

## 2016-10-17 NOTE — ED Triage Notes (Signed)
Pt c/o shortness of breath and heart racing out of her chest for the last week. Pt states it has worsened. Pt heart rate 156 in afib at present time.

## 2016-10-18 ENCOUNTER — Encounter (HOSPITAL_COMMUNITY): Payer: Self-pay | Admitting: Internal Medicine

## 2016-10-18 ENCOUNTER — Inpatient Hospital Stay (HOSPITAL_COMMUNITY): Payer: Medicare Other

## 2016-10-18 DIAGNOSIS — I10 Essential (primary) hypertension: Secondary | ICD-10-CM | POA: Diagnosis not present

## 2016-10-18 DIAGNOSIS — K589 Irritable bowel syndrome without diarrhea: Secondary | ICD-10-CM | POA: Diagnosis present

## 2016-10-18 DIAGNOSIS — I5033 Acute on chronic diastolic (congestive) heart failure: Secondary | ICD-10-CM | POA: Diagnosis not present

## 2016-10-18 DIAGNOSIS — Z809 Family history of malignant neoplasm, unspecified: Secondary | ICD-10-CM | POA: Diagnosis not present

## 2016-10-18 DIAGNOSIS — Z7984 Long term (current) use of oral hypoglycemic drugs: Secondary | ICD-10-CM | POA: Diagnosis not present

## 2016-10-18 DIAGNOSIS — I428 Other cardiomyopathies: Secondary | ICD-10-CM | POA: Diagnosis present

## 2016-10-18 DIAGNOSIS — D869 Sarcoidosis, unspecified: Secondary | ICD-10-CM | POA: Diagnosis present

## 2016-10-18 DIAGNOSIS — I110 Hypertensive heart disease with heart failure: Secondary | ICD-10-CM | POA: Diagnosis present

## 2016-10-18 DIAGNOSIS — J45909 Unspecified asthma, uncomplicated: Secondary | ICD-10-CM | POA: Diagnosis present

## 2016-10-18 DIAGNOSIS — E662 Morbid (severe) obesity with alveolar hypoventilation: Secondary | ICD-10-CM | POA: Diagnosis present

## 2016-10-18 DIAGNOSIS — I5023 Acute on chronic systolic (congestive) heart failure: Secondary | ICD-10-CM | POA: Diagnosis not present

## 2016-10-18 DIAGNOSIS — I4891 Unspecified atrial fibrillation: Secondary | ICD-10-CM | POA: Diagnosis present

## 2016-10-18 DIAGNOSIS — E876 Hypokalemia: Secondary | ICD-10-CM | POA: Diagnosis not present

## 2016-10-18 DIAGNOSIS — E118 Type 2 diabetes mellitus with unspecified complications: Secondary | ICD-10-CM | POA: Diagnosis not present

## 2016-10-18 DIAGNOSIS — E0590 Thyrotoxicosis, unspecified without thyrotoxic crisis or storm: Secondary | ICD-10-CM | POA: Diagnosis present

## 2016-10-18 DIAGNOSIS — E785 Hyperlipidemia, unspecified: Secondary | ICD-10-CM

## 2016-10-18 DIAGNOSIS — I481 Persistent atrial fibrillation: Secondary | ICD-10-CM | POA: Diagnosis not present

## 2016-10-18 DIAGNOSIS — Z888 Allergy status to other drugs, medicaments and biological substances status: Secondary | ICD-10-CM | POA: Diagnosis not present

## 2016-10-18 DIAGNOSIS — I509 Heart failure, unspecified: Secondary | ICD-10-CM

## 2016-10-18 DIAGNOSIS — I87309 Chronic venous hypertension (idiopathic) without complications of unspecified lower extremity: Secondary | ICD-10-CM | POA: Diagnosis present

## 2016-10-18 DIAGNOSIS — I5022 Chronic systolic (congestive) heart failure: Secondary | ICD-10-CM | POA: Diagnosis present

## 2016-10-18 DIAGNOSIS — T462X5A Adverse effect of other antidysrhythmic drugs, initial encounter: Secondary | ICD-10-CM | POA: Diagnosis present

## 2016-10-18 DIAGNOSIS — Z820 Family history of epilepsy and other diseases of the nervous system: Secondary | ICD-10-CM | POA: Diagnosis not present

## 2016-10-18 DIAGNOSIS — I5021 Acute systolic (congestive) heart failure: Secondary | ICD-10-CM | POA: Diagnosis not present

## 2016-10-18 DIAGNOSIS — I5043 Acute on chronic combined systolic (congestive) and diastolic (congestive) heart failure: Secondary | ICD-10-CM | POA: Diagnosis present

## 2016-10-18 DIAGNOSIS — Z6841 Body Mass Index (BMI) 40.0 and over, adult: Secondary | ICD-10-CM | POA: Diagnosis not present

## 2016-10-18 DIAGNOSIS — Z86010 Personal history of colonic polyps: Secondary | ICD-10-CM | POA: Diagnosis not present

## 2016-10-18 LAB — COMPREHENSIVE METABOLIC PANEL
ALBUMIN: 3.9 g/dL (ref 3.5–5.0)
ALT: 19 U/L (ref 14–54)
AST: 20 U/L (ref 15–41)
Alkaline Phosphatase: 70 U/L (ref 38–126)
Anion gap: 11 (ref 5–15)
BUN: 17 mg/dL (ref 6–20)
CHLORIDE: 102 mmol/L (ref 101–111)
CO2: 29 mmol/L (ref 22–32)
Calcium: 9.4 mg/dL (ref 8.9–10.3)
Creatinine, Ser: 0.94 mg/dL (ref 0.44–1.00)
GFR calc Af Amer: >60 mL/min (ref >60)
GFR calc non Af Amer: >60 mL/min (ref >60)
GLUCOSE: 175 mg/dL — AB (ref 65–99)
POTASSIUM: 3.4 mmol/L — AB (ref 3.5–5.1)
SODIUM: 142 mmol/L (ref 135–145)
Total Bilirubin: 0.7 mg/dL (ref 0.3–1.2)
Total Protein: 7.4 g/dL (ref 6.5–8.1)

## 2016-10-18 LAB — RESPIRATORY PANEL BY PCR
ADENOVIRUS-RVPPCR: NOT DETECTED
BORDETELLA PERTUSSIS-RVPCR: NOT DETECTED
CHLAMYDOPHILA PNEUMONIAE-RVPPCR: NOT DETECTED
CORONAVIRUS 229E-RVPPCR: NOT DETECTED
CORONAVIRUS HKU1-RVPPCR: NOT DETECTED
CORONAVIRUS NL63-RVPPCR: NOT DETECTED
Coronavirus OC43: NOT DETECTED
Influenza A: NOT DETECTED
Influenza B: NOT DETECTED
Metapneumovirus: NOT DETECTED
Mycoplasma pneumoniae: NOT DETECTED
Parainfluenza Virus 1: NOT DETECTED
Parainfluenza Virus 2: NOT DETECTED
Parainfluenza Virus 3: NOT DETECTED
Parainfluenza Virus 4: NOT DETECTED
Respiratory Syncytial Virus: NOT DETECTED
Rhinovirus / Enterovirus: NOT DETECTED

## 2016-10-18 LAB — CBC
HEMATOCRIT: 34 % — AB (ref 36.0–46.0)
Hemoglobin: 11.6 g/dL — ABNORMAL LOW (ref 12.0–15.0)
MCH: 31.1 pg (ref 26.0–34.0)
MCHC: 34.1 g/dL (ref 30.0–36.0)
MCV: 91.2 fL (ref 78.0–100.0)
Platelets: 217 10*3/uL (ref 150–400)
RBC: 3.73 MIL/uL — ABNORMAL LOW (ref 3.87–5.11)
RDW: 14.6 % (ref 11.5–15.5)
WBC: 7.7 10*3/uL (ref 4.0–10.5)

## 2016-10-18 LAB — INFLUENZA PANEL BY PCR (TYPE A & B)
INFLAPCR: NEGATIVE
INFLBPCR: NEGATIVE

## 2016-10-18 LAB — TSH: TSH: 0.014 u[IU]/mL — ABNORMAL LOW (ref 0.350–4.500)

## 2016-10-18 LAB — URINALYSIS, ROUTINE W REFLEX MICROSCOPIC
Bilirubin Urine: NEGATIVE
Glucose, UA: NEGATIVE mg/dL
Hgb urine dipstick: NEGATIVE
KETONES UR: NEGATIVE mg/dL
LEUKOCYTES UA: NEGATIVE
NITRITE: NEGATIVE
PROTEIN: NEGATIVE mg/dL
Specific Gravity, Urine: 1.004 — ABNORMAL LOW (ref 1.005–1.030)
pH: 7 (ref 5.0–8.0)

## 2016-10-18 LAB — GLUCOSE, CAPILLARY
GLUCOSE-CAPILLARY: 153 mg/dL — AB (ref 65–99)
Glucose-Capillary: 198 mg/dL — ABNORMAL HIGH (ref 65–99)
Glucose-Capillary: 237 mg/dL — ABNORMAL HIGH (ref 65–99)
Glucose-Capillary: 192 mg/dL — ABNORMAL HIGH (ref 65–99)

## 2016-10-18 LAB — TROPONIN I
Troponin I: <0.03 ng/mL (ref <0.03)
Troponin I: <0.03 ng/mL (ref <0.03)

## 2016-10-18 LAB — ECHOCARDIOGRAM COMPLETE
HEIGHTINCHES: 63 in
Weight: 4218.72 oz

## 2016-10-18 LAB — MAGNESIUM
Magnesium: 1.7 mg/dL (ref 1.7–2.4)
Magnesium: 1.7 mg/dL (ref 1.7–2.4)

## 2016-10-18 LAB — MRSA PCR SCREENING: MRSA by PCR: NEGATIVE

## 2016-10-18 LAB — PHOSPHORUS: PHOSPHORUS: 3.9 mg/dL (ref 2.5–4.6)

## 2016-10-18 MED ORDER — LOSARTAN POTASSIUM 25 MG PO TABS
25.0000 mg | ORAL_TABLET | Freq: Every day | ORAL | Status: DC
  Administered 2016-10-18 – 2016-10-20 (×3): 25 mg via ORAL
  Filled 2016-10-18 (×3): qty 1

## 2016-10-18 MED ORDER — METOPROLOL TARTRATE 25 MG PO TABS
50.0000 mg | ORAL_TABLET | Freq: Two times a day (BID) | ORAL | Status: DC
  Administered 2016-10-18 (×2): 50 mg via ORAL
  Filled 2016-10-18 (×3): qty 2

## 2016-10-18 MED ORDER — SODIUM CHLORIDE 0.9 % IV SOLN: 250.0000 mL | INTRAVENOUS | Status: DC | PRN

## 2016-10-18 MED ORDER — ONDANSETRON HCL 4 MG PO TABS: 4.0000 mg | ORAL_TABLET | Freq: Four times a day (QID) | ORAL | Status: DC | PRN

## 2016-10-18 MED ORDER — ROSUVASTATIN CALCIUM 20 MG PO TABS
20.0000 mg | ORAL_TABLET | Freq: Every day | ORAL | Status: DC
Start: 2016-10-18 — End: 2016-10-20
  Administered 2016-10-18 – 2016-10-20 (×3): 20 mg via ORAL
  Filled 2016-10-18 (×3): qty 1

## 2016-10-18 MED ORDER — PREDNISONE 20 MG PO TABS
40.0000 mg | ORAL_TABLET | Freq: Every day | ORAL | Status: DC
  Administered 2016-10-18 – 2016-10-20 (×3): 40 mg via ORAL
  Filled 2016-10-18 (×3): qty 2

## 2016-10-18 MED ORDER — GUAIFENESIN ER 600 MG PO TB12
600.0000 mg | ORAL_TABLET | Freq: Two times a day (BID) | ORAL | Status: DC
  Administered 2016-10-18 – 2016-10-20 (×6): 600 mg via ORAL
  Filled 2016-10-18 (×6): qty 1

## 2016-10-18 MED ORDER — HYDRALAZINE HCL 20 MG/ML IJ SOLN
INTRAMUSCULAR | Status: AC
  Filled 2016-10-18: qty 1

## 2016-10-18 MED ORDER — ALPRAZOLAM 1 MG PO TABS
1.0000 mg | ORAL_TABLET | Freq: Every evening | ORAL | Status: DC | PRN
  Administered 2016-10-18 – 2016-10-20 (×3): 1 mg via ORAL
  Filled 2016-10-18 (×3): qty 1

## 2016-10-18 MED ORDER — POTASSIUM CHLORIDE CRYS ER 20 MEQ PO TBCR
40.0000 meq | EXTENDED_RELEASE_TABLET | Freq: Once | ORAL | Status: AC
  Administered 2016-10-18: 40 meq via ORAL
  Filled 2016-10-18: qty 2

## 2016-10-18 MED ORDER — HYDRALAZINE HCL 20 MG/ML IJ SOLN
5.0000 mg | Freq: Once | INTRAMUSCULAR | Status: AC
  Administered 2016-10-18: 5 mg via INTRAVENOUS

## 2016-10-18 MED ORDER — SODIUM CHLORIDE 0.9% FLUSH
3.0000 mL | Freq: Two times a day (BID) | INTRAVENOUS | Status: DC
  Administered 2016-10-18 – 2016-10-19 (×2): 3 mL via INTRAVENOUS

## 2016-10-18 MED ORDER — INSULIN ASPART 100 UNIT/ML ~~LOC~~ SOLN
0.0000 [IU] | Freq: Three times a day (TID) | SUBCUTANEOUS | Status: DC
  Administered 2016-10-18: 5 [IU] via SUBCUTANEOUS
  Administered 2016-10-18 – 2016-10-19 (×3): 3 [IU] via SUBCUTANEOUS
  Administered 2016-10-19 (×2): 5 [IU] via SUBCUTANEOUS
  Administered 2016-10-20 (×2): 3 [IU] via SUBCUTANEOUS

## 2016-10-18 MED ORDER — ACETAMINOPHEN 325 MG PO TABS: 650.0000 mg | ORAL_TABLET | Freq: Four times a day (QID) | ORAL | Status: DC | PRN

## 2016-10-18 MED ORDER — IPRATROPIUM BROMIDE 0.02 % IN SOLN
0.5000 mg | Freq: Four times a day (QID) | RESPIRATORY_TRACT | Status: DC
  Administered 2016-10-18: 0.5 mg via RESPIRATORY_TRACT
  Filled 2016-10-18: qty 2.5

## 2016-10-18 MED ORDER — SODIUM CHLORIDE 0.9% FLUSH
3.0000 mL | Freq: Two times a day (BID) | INTRAVENOUS | Status: DC
  Administered 2016-10-18 – 2016-10-19 (×3): 3 mL via INTRAVENOUS

## 2016-10-18 MED ORDER — FLUOXETINE HCL 20 MG PO CAPS
20.0000 mg | ORAL_CAPSULE | Freq: Every day | ORAL | Status: DC
  Administered 2016-10-18 – 2016-10-20 (×3): 20 mg via ORAL
  Filled 2016-10-18 (×3): qty 1

## 2016-10-18 MED ORDER — HYDROCODONE-HOMATROPINE 5-1.5 MG/5ML PO SYRP
5.0000 mL | ORAL_SOLUTION | Freq: Four times a day (QID) | ORAL | Status: DC | PRN
  Administered 2016-10-18: 5 mL via ORAL
  Filled 2016-10-18: qty 5

## 2016-10-18 MED ORDER — ONDANSETRON HCL 4 MG/2ML IJ SOLN: 4.0000 mg | Freq: Four times a day (QID) | INTRAMUSCULAR | Status: DC | PRN

## 2016-10-18 MED ORDER — IPRATROPIUM BROMIDE 0.02 % IN SOLN: 0.5000 mg | Freq: Four times a day (QID) | RESPIRATORY_TRACT | Status: DC | PRN

## 2016-10-18 MED ORDER — DILTIAZEM HCL-DEXTROSE 100-5 MG/100ML-% IV SOLN (PREMIX)
5.0000 mg/h | INTRAVENOUS | Status: DC
Start: 2016-10-18 — End: 2016-10-18
  Administered 2016-10-18: 5 mg/h via INTRAVENOUS

## 2016-10-18 MED ORDER — ACETAMINOPHEN 650 MG RE SUPP: 650.0000 mg | Freq: Four times a day (QID) | RECTAL | Status: DC | PRN

## 2016-10-18 MED ORDER — DABIGATRAN ETEXILATE MESYLATE 150 MG PO CAPS
150.0000 mg | ORAL_CAPSULE | Freq: Two times a day (BID) | ORAL | Status: DC
  Administered 2016-10-18 – 2016-10-20 (×6): 150 mg via ORAL
  Filled 2016-10-18 (×6): qty 1

## 2016-10-18 MED ORDER — FUROSEMIDE 10 MG/ML IJ SOLN
40.0000 mg | Freq: Two times a day (BID) | INTRAMUSCULAR | Status: DC
  Administered 2016-10-18 – 2016-10-19 (×4): 40 mg via INTRAVENOUS
  Filled 2016-10-18 (×4): qty 4

## 2016-10-18 MED ORDER — TRAZODONE HCL 50 MG PO TABS: 50.0000 mg | ORAL_TABLET | Freq: Every evening | ORAL | Status: DC | PRN

## 2016-10-18 MED ORDER — INSULIN ASPART 100 UNIT/ML ~~LOC~~ SOLN: 0.0000 [IU] | Freq: Every day | SUBCUTANEOUS | Status: DC

## 2016-10-18 MED ORDER — SODIUM CHLORIDE 0.9% FLUSH
3.0000 mL | INTRAVENOUS | Status: DC | PRN
  Administered 2016-10-19: 3 mL via INTRAVENOUS
  Filled 2016-10-18: qty 3

## 2016-10-18 NOTE — Progress Notes (Signed)
  Echocardiogram 2D Echocardiogram has been performed.  Foster, Traylon Schimming 10/18/2016, 9:58 AM 

## 2016-10-18 NOTE — Progress Notes (Signed)
Patient admitted after midnight, please see H&P. Added flu pcr.  Appreciate cardiology consult.  Remain in sdu.  Feeling better overall  Eulogio Bear, DO

## 2016-10-18 NOTE — Consult Note (Signed)
Primary Physician: Primary Cardiologist:  [name]  Asked to see re afib with RVR  HPI: Patient is a 65 yo with history of sarcoid, LVH  Nonobstructive CAD at cath in 2012.  Echo in 2014 LVE 25 to 30%  Cardoversion in 2014 with normalization of LVEF  Rx amio  Not interested in ablation   Amio d/ced due to concern for neuro toxiicity.   Patient having problems answering questions (feels bad)  Says she thinks her tremor is getting better   She says over the past 1 to 2 wks she has had increased SOB and ankle edema  Two days ago she noticed her heart racing   Has had feverish feeling, chills  Did not take temp   Diarrhea but none now.    Patient admitted early this AM with intermitt palpitations  SOB  Rx albuterol at home      Past Medical History:  Diagnosis Date  . Anemia   . Ankle fracture, right    x 2  . Antineoplastic and immunosuppressive drugs causing adverse effect in therapeutic use   . Asthma   . Atrial fibrillation ([place])   . Chronic venous hypertension without complications   . Colon polyps   . Colon polyps 2009  . Coronary artery disease    no CAD by cath 11.2010  . Depressive disorder, not elsewhere classified   . Diastolic CHF, chronic ([place])   . Diverticulosis of colon 2009   colonoscopy  . Dyslipidemia   . Glucose intolerance (impaired glucose tolerance)    hgb AIC 6.1 09/02/2009  . Gout   . Hepatitis, unspecified    hx of  . Hx of hysterectomy 2002  . Hyperlipidemia   . Hypertension   . Irritable bowel syndrome   . Lung nodule    68mm RLL nodule on CT Chest  07/2009, resolved by 2011 CT  . Noncompliance   . Obesity   . Persistent atrial fibrillation ([place])    s/p DCCV 07/2009; Pradaxa Rx  . PONV (postoperative nausea and vomiting)   . Sarcoidosis ([place])    dx by eye exam  . Sleep disturbance 06/2013   sleep study, mild abnormality, no criteria for CPAP    Medications Prior to Admission  Medication Sig Dispense Refill  . allopurinol (ZYLOPRIM)  300 MG tablet TAKE 1 TABLET BY MOUTH EVERY DAY (Patient taking differently: TAKE 300 MG BY MOUTH DAILY AS NEEDED FOR GOUT) 90 tablet 2  . ALPRAZolam (XANAX) 1 MG tablet Take 1 tablet (1 mg total) by mouth at bedtime as needed for anxiety. 45 tablet 1  . colchicine (COLCRYS) 0.6 MG tablet Take 1 tablet (0.6 mg total) by mouth daily as needed. Reported on 01/21/2016 (Patient taking differently: Take 0.6 mg by mouth daily as needed (GOUT). Reported on 01/21/2016) 45 tablet 1  . dabigatran (PRADAXA) 150 MG CAPS capsule Take 1 capsule (150 mg total) by mouth 2 (two) times daily. 60 capsule 11  . FLUoxetine (PROZAC) 20 MG tablet TAKE 1 TABLET(20 MG) BY MOUTH DAILY 90 tablet 0  . furosemide (LASIX) 40 MG tablet TAKE 1 TABLET(40 MG) BY MOUTH DAILY AS NEEDED FOR FLUID RETENTION OR SWELLING 30 tablet 0  . losartan (COZAAR) 25 MG tablet Take 1 tablet (25 mg total) by mouth daily. 90 tablet 3  . metFORMIN (GLUCOPHAGE) 500 MG tablet Take 1 tablet (500 mg total) by mouth 2 (two) times daily with a meal. 1 tablet po BID for diabetes 180 tablet 1  .  rosuvastatin (CRESTOR) 20 MG tablet Take 1 tablet (20 mg total) by mouth daily. 90 tablet 3  . traZODone (DESYREL) 50 MG tablet TAKE 1/2 TO 1 TABLET BY MOUTH EVERY NIGHT AT BEDTIME AS NEEDED FOR SLEEP 90 tablet 3  . VENTOLIN HFA 108 (90 Base) MCG/ACT inhaler INHALE 2 PUFFS INTO THE LUNGS EVERY 6 HOURS AS NEEDED FOR WHEEZING OR SHORTNESS OF BREATH 18 g 3  . B Complex-C (B-COMPLEX WITH VITAMIN C) tablet Take 1 tablet by mouth daily. (Patient not taking: Reported on 10/17/2016) 30 tablet 5  . carvedilol (COREG) 6.25 MG tablet TAKE 1 TABLET(6.25 MG) BY MOUTH TWICE DAILY WITH A MEAL (Patient not taking: Reported on 10/17/2016) 180 tablet 1  . polyethylene glycol powder (GLYCOLAX/MIRALAX) powder Mix 17 grams in 8 oz of water 1-2 x daily (Patient not taking: Reported on 10/17/2016) 255 g 3  . potassium chloride SA (K-DUR,KLOR-CON) 20 MEQ tablet Take 1 tablet (20 mEq total) by mouth  daily. (Patient not taking: Reported on 10/17/2016) 30 tablet 8     . dabigatran  150 mg Oral BID  . FLUoxetine  20 mg Oral Daily  . furosemide  40 mg Intravenous BID  . guaiFENesin  600 mg Oral BID  . hydrALAZINE      . insulin aspart  0-15 Units Subcutaneous TID WC  . insulin aspart  0-5 Units Subcutaneous QHS  . ipratropium  0.5 mg Nebulization Q6H  . losartan  25 mg Oral Daily  . potassium chloride  40 mEq Oral Once  . predniSONE  40 mg Oral Q breakfast  . rosuvastatin  20 mg Oral Daily  . sodium chloride flush  3 mL Intravenous Q12H  . sodium chloride flush  3 mL Intravenous Q12H    Infusions: . diltiazem (CARDIZEM) infusion 5 mg/hr (10/18/16 0600)    Allergies  Allergen Reactions  . Albuterol     Palpitations, intolerance  . Amiodarone     adverse reaction: Tremor/gait disurbance  . Atorvastatin     Body aches and pains--stiffness.   [name] [Pitavastatin]     Body aches  . Propoxyphene N-Acetaminophen Nausea And Vomiting    Social History   Social History  . Marital status: Single    Spouse name: N/A  . Number of children: 5  . Years of education: college   Occupational History  . Not on file.   Social History Main Topics  . Smoking status: Never Smoker  . Smokeless tobacco: Never Used  . Alcohol use 0.0 oz/week     Comment: drinks 1 glass of wine every 2 weeks on average (occasional)  . Drug use: No  . Sexual activity: Not Currently    Birth control/ protection: Surgical   Other Topics Concern  . Not on file   Social History Narrative   Pt lives in [place] with her son.  Unemployed.  Right handed.     Education college       Family History  Problem Relation Age of Onset  . Pneumonia Father     deceased  . Hypertension Father   . Cancer Father   . Multiple sclerosis Mother     hx  . Emphysema Other     runs in the family    REVIEW OF SYSTEMS:  All systems reviewed  Negative to the above problem except as noted above.     PHYSICAL EXAM: Vitals:   10/18/16 0500 10/18/16 0600  BP: (!) 160/79 (!) 170/145  Pulse:  Resp: 18 (!) 22  Temp:       Intake/Output Summary (Last 24 hours) at 10/18/16 0747 Last data filed at 10/18/16 0600  Gross per 24 hour  Intake              265 ml  Output             1000 ml  Net             -735 ml    General:  Morbidly obese 65 yo  Appears to feel bad HEENT: normal Neck: supple. no JVD. Carotids 2+ bilat; no bruits. No lymphadenopathy or thryomegaly appreciated. Cor: PMI nondisplaced. Irregular rate & rhythm. No rubs, gallops or murmurs. Lungs: Rales at r base   Abdomen: Supple  Mild RUQ tenderness Good bowel sounds. Extremities: no cyanosis, clubbing, rash  Tr to 1+ edema Neuro: alert & oriented x 3, cranial nerves grossly intact. moves all 4 extremities w/o difficulty. Affect pleasant.  ECG:  12/30  Atrial fib  132 bpm  Nonspecific ST T wave changes    Results for orders placed or performed during the hospital encounter of 10/17/16 (from the past 24 hour(s))  CBC with Differential/Platelet     Status: Abnormal   Collection Time: 10/17/16 11:05 PM  Result Value Ref Range   WBC 7.7 4.0 - 10.5 K/uL   RBC 3.70 (L) 3.87 - 5.11 MIL/uL   Hemoglobin 11.4 (L) 12.0 - 15.0 g/dL   HCT 33.8 (L) 36.0 - 46.0 %   MCV 91.4 78.0 - 100.0 fL   MCH 30.8 26.0 - 34.0 pg   MCHC 33.7 30.0 - 36.0 g/dL   RDW 14.7 11.5 - 15.5 %   Platelets 221 150 - 400 K/uL   Neutrophils Relative % 54 %   Neutro Abs 4.3 1.7 - 7.7 K/uL   Lymphocytes Relative 38 %   Lymphs Abs 2.9 0.7 - 4.0 K/uL   Monocytes Relative 7 %   Monocytes Absolute 0.5 0.1 - 1.0 K/uL   Eosinophils Relative 1 %   Eosinophils Absolute 0.1 0.0 - 0.7 K/uL   Basophils Relative 0 %   Basophils Absolute 0.0 0.0 - 0.1 K/uL  Comprehensive metabolic panel     Status: Abnormal   Collection Time: 10/17/16 11:05 PM  Result Value Ref Range   Sodium 140 135 - 145 mmol/L   Potassium 3.3 (L) 3.5 - 5.1 mmol/L   Chloride 104 101 -  111 mmol/L   CO2 27 22 - 32 mmol/L   Glucose, Bld 155 (H) 65 - 99 mg/dL   BUN 18 6 - 20 mg/dL   Creatinine, Ser 0.82 0.44 - 1.00 mg/dL   Calcium 9.3 8.9 - 10.3 mg/dL   Total Protein 7.1 6.5 - 8.1 g/dL   Albumin 3.6 3.5 - 5.0 g/dL   AST 19 15 - 41 U/L   ALT 21 14 - 54 U/L   Alkaline Phosphatase 63 38 - 126 U/L   Total Bilirubin 0.7 0.3 - 1.2 mg/dL   GFR calc non Af Amer >60 >60 mL/min   GFR calc Af Amer >60 >60 mL/min   Anion gap 9 5 - 15  Magnesium     Status: None   Collection Time: 10/17/16 11:05 PM  Result Value Ref Range   Magnesium 1.7 1.7 - 2.4 mg/dL  Brain natriuretic peptide     Status: Abnormal   Collection Time: 10/17/16 11:06 PM  Result Value Ref Range   B Natriuretic Peptide 722.6 (  H) 0.0 - 100.0 pg/mL  I-stat troponin, ED     Status: None   Collection Time: 10/17/16 11:15 PM  Result Value Ref Range   Troponin i, poc 0.03 0.00 - 0.08 ng/mL   Comment 3          Magnesium     Status: None   Collection Time: 10/18/16  2:54 AM  Result Value Ref Range   Magnesium 1.7 1.7 - 2.4 mg/dL  Phosphorus     Status: None   Collection Time: 10/18/16  2:54 AM  Result Value Ref Range   Phosphorus 3.9 2.5 - 4.6 mg/dL  Comprehensive metabolic panel     Status: Abnormal   Collection Time: 10/18/16  2:54 AM  Result Value Ref Range   Sodium 142 135 - 145 mmol/L   Potassium 3.4 (L) 3.5 - 5.1 mmol/L   Chloride 102 101 - 111 mmol/L   CO2 29 22 - 32 mmol/L   Glucose, Bld 175 (H) 65 - 99 mg/dL   BUN 17 6 - 20 mg/dL   Creatinine, Ser 0.94 0.44 - 1.00 mg/dL   Calcium 9.4 8.9 - 10.3 mg/dL   Total Protein 7.4 6.5 - 8.1 g/dL   Albumin 3.9 3.5 - 5.0 g/dL   AST 20 15 - 41 U/L   ALT 19 14 - 54 U/L   Alkaline Phosphatase 70 38 - 126 U/L   Total Bilirubin 0.7 0.3 - 1.2 mg/dL   GFR calc non Af Amer >60 >60 mL/min   GFR calc Af Amer >60 >60 mL/min   Anion gap 11 5 - 15  CBC     Status: Abnormal   Collection Time: 10/18/16  2:54 AM  Result Value Ref Range   WBC 7.7 4.0 - 10.5 K/uL    RBC 3.73 (L) 3.87 - 5.11 MIL/uL   Hemoglobin 11.6 (L) 12.0 - 15.0 g/dL   HCT 34.0 (L) 36.0 - 46.0 %   MCV 91.2 78.0 - 100.0 fL   MCH 31.1 26.0 - 34.0 pg   MCHC 34.1 30.0 - 36.0 g/dL   RDW 14.6 11.5 - 15.5 %   Platelets 217 150 - 400 K/uL  Troponin I     Status: None   Collection Time: 10/18/16  2:54 AM  Result Value Ref Range   Troponin I <0.03 <0.03 ng/mL  TSH     Status: Abnormal   Collection Time: 10/18/16  2:54 AM  Result Value Ref Range   TSH 0.014 (L) 0.350 - 4.500 uIU/mL  Urinalysis, Routine w reflex microscopic     Status: Abnormal   Collection Time: 10/18/16  3:06 AM  Result Value Ref Range   Color, Urine COLORLESS (A) YELLOW   APPearance CLEAR CLEAR   Specific Gravity, Urine 1.004 (L) 1.005 - 1.030   pH 7.0 5.0 - 8.0   Glucose, UA NEGATIVE NEGATIVE mg/dL   Hgb urine dipstick NEGATIVE NEGATIVE   Bilirubin Urine NEGATIVE NEGATIVE   Ketones, ur NEGATIVE NEGATIVE mg/dL   Protein, ur NEGATIVE NEGATIVE mg/dL   Nitrite NEGATIVE NEGATIVE   Leukocytes, UA NEGATIVE NEGATIVE  MRSA PCR Screening     Status: None   Collection Time: 10/18/16  3:24 AM  Result Value Ref Range   MRSA by PCR NEGATIVE NEGATIVE   Dg Chest Port 1 View  Result Date: 10/17/2016 CLINICAL DATA:  Shortness of breath for 1 week, progressive. EXAM: PORTABLE CHEST 1 VIEW COMPARISON:  05/16/2014 FINDINGS: The heart is enlarged, likely accentuated by portable technique  and low lung volumes. No definite pulmonary edema. No large pleural effusion. Probable right infrahilar atelectasis. No pneumothorax. Grossly stable osseous structures. IMPRESSION: Enlarged cardiac silhouette, likely accentuated by AP portable technique and low lung volumes. Right infrahilar atelectasis. Electronically Signed   By: Jeb Levering M.D.   On: 10/17/2016 22:58     ASSESSMENT: 65 yo who presents with SOB  Noticed edema and SOB for a couple wks  Palpitations for 2 days   On admit found to be in afib  Currently rates are not  bad Echo was just done  I will need to review  1  Persistent atrial fibrillation  (CHADSVASc of 2)  For now I would recomm rate control with lopressor   I agree with IV lasix  Follow strict I/Os    Check TSH    Need to review neuro eval with amiodarone use    2  HTN  BP is high  Diurese  Lasix   3  ID  Being eval for influenza  Will continue to follow

## 2016-10-18 NOTE — Progress Notes (Signed)
.    HEMATOLOGY/ONCOLOGY CLINIC NOTE  Date of Service: .09/15/2016  Patient Care Team: David S Tysinger, PA-C as PCP - General (Family Medicine)  CHIEF COMPLAINTS/PURPOSE OF CONSULTATION:  Anemia  HISTORY OF PRESENTING ILLNESS:   Stephanie [name] is a wonderful 65 y.o. female who has been referred to us by Dr .TYSINGER, DAVID SHANE, PA-C for evaluation and management of Anemia.  Patient has a hx of chronic medical co-morbidities including htn, diastolic CHF, sarcoidosis (diagnosed in 1982 with Xerophthamia/lung spots, treated with 1 yr of steroids at that time), obesity, IBS, gout reports increased fatigue for about 8 months.  Recent labs from April shows some mild anemia with a hgb of 10.6, MCV of 97 with normal PLT and WBC counts. She notes that she has had some mild anemia atleast since 2010. She has previous had a TAH for excessive bleeding due to fibroids. Notes that her grandson passed away in April this year and this has been weighing on her a lot. No chest pain/light headedness/overt SOB at rest.  No overt bleeding reported currently.  INTERVAL HISTORY  Patient is here for her scheduled 4 month f/u for anemia. Notes that physically feels better and her anemia has resolved. She however has felt very emotionally distraught after having lost her dog recently and feels down and has been grieving a lot. We talked about her feeling and she was very thankful for this. She notes that her DM has been better controlled.  MEDICAL HISTORY:  Past Medical History:  Diagnosis Date  . Anemia   . Ankle fracture, right    x 2  . Antineoplastic and immunosuppressive drugs causing adverse effect in therapeutic use   . Asthma   . Atrial fibrillation (HCC)   . Chronic venous hypertension without complications   . Colon polyps   . Colon polyps 2009  . Coronary artery disease    no CAD by cath 11.2010  . Depressive disorder, not elsewhere classified   . Diastolic CHF, chronic (HCC)   .  Diverticulosis of colon 2009   colonoscopy  . Dyslipidemia   . Glucose intolerance (impaired glucose tolerance)    hgb AIC 6.1 09/02/2009  . Gout   . Hepatitis, unspecified    hx of  . Hx of hysterectomy 2002  . Hyperlipidemia   . Hypertension   . Irritable bowel syndrome   . Lung nodule    12mm RLL nodule on CT Chest  07/2009, resolved by 2011 CT  . Noncompliance   . Obesity   . Persistent atrial fibrillation (HCC)    s/p DCCV 07/2009; Pradaxa Rx  . PONV (postoperative nausea and vomiting)   . Sarcoidosis (HCC)    dx by eye exam  . Sleep disturbance 06/2013   sleep study, mild abnormality, no criteria for CPAP    SURGICAL HISTORY: Past Surgical History:  Procedure Laterality Date  . ABDOMINAL HYSTERECTOMY    . CARDIOVERSION     x 2  . CARDIOVERSION N/A 02/27/2013   Procedure: CARDIOVERSION;  Surgeon: Brian S Crenshaw, MD;  Location: [facility];  Service: Cardiovascular;  Laterality: N/A;  . CHOLECYSTECTOMY    . COLONOSCOPY     Diverticulosis, colon polyps, repeat 2014, Dr. Kaplan  . ESOPHAGOGASTRODUODENOSCOPY N/A 05/16/2014   Procedure: ESOPHAGOGASTRODUODENOSCOPY (EGD);  Surgeon: [place] F Ganem, MD;  Location: [facility];  Service: Gastroenterology;  Laterality: N/A;  . HERNIA REPAIR  2009    SOCIAL HISTORY: Social History   Social History  . Marital status: Single      Spouse name: N/A  . Number of children: 5  . Years of education: college   Occupational History  . Not on file.   Social History Main Topics  . Smoking status: Never Smoker  . Smokeless tobacco: Never Used  . Alcohol use 0.0 oz/week     Comment: drinks 1 glass of wine every 2 weeks on average (occasional)  . Drug use: No  . Sexual activity: Not Currently    Birth control/ protection: Surgical   Other Topics Concern  . Not on file   Social History Narrative   Pt lives in [place] with her son.  Unemployed.  Right handed.     Education college       FAMILY HISTORY: Family History    Problem Relation Age of Onset  . Pneumonia Father     deceased  . Hypertension Father   . Cancer Father   . Multiple sclerosis Mother     hx  . Emphysema Other     runs in the family    ALLERGIES:  is allergic to albuterol; amiodarone; atorvastatin; livalo [pitavastatin]; and propoxyphene n-acetaminophen.  MEDICATIONS:  No current facility-administered medications for this visit.    No current outpatient prescriptions on file.   Facility-Administered Medications Ordered in Other Visits  Medication Dose Route Frequency Provider Last Rate Last Dose  . 0.9 %  sodium chloride infusion  250 mL Intravenous PRN Toy Baker, MD      . acetaminophen (TYLENOL) tablet 650 mg  650 mg Oral Q6H PRN Toy Baker, MD       Or  . acetaminophen (TYLENOL) suppository 650 mg  650 mg Rectal Q6H PRN Toy Baker, MD      . ALPRAZolam Duanne Moron) tablet 1 mg  1 mg Oral QHS PRN Toy Baker, MD   1 mg at 10/18/16 0228  . dabigatran (PRADAXA) capsule 150 mg  150 mg Oral BID Toy Baker, MD   150 mg at 10/18/16 0946  . FLUoxetine (PROZAC) capsule 20 mg  20 mg Oral Daily Toy Baker, MD   20 mg at 10/18/16 0945  . furosemide (LASIX) injection 40 mg  40 mg Intravenous BID Toy Baker, MD   40 mg at 10/18/16 0823  . guaiFENesin (MUCINEX) 12 hr tablet 600 mg  600 mg Oral BID Toy Baker, MD   600 mg at 10/18/16 0946  . hydrALAZINE (APRESOLINE) 20 MG/ML injection           . insulin aspart (novoLOG) injection 0-15 Units  0-15 Units Subcutaneous TID WC Toy Baker, MD   3 Units at 10/18/16 1157  . insulin aspart (novoLOG) injection 0-5 Units  0-5 Units Subcutaneous QHS Toy Baker, MD      . ipratropium (ATROVENT) nebulizer solution 0.5 mg  0.5 mg Nebulization Q6H Toy Baker, MD   0.5 mg at 10/18/16 0959  . losartan (COZAAR) tablet 25 mg  25 mg Oral Daily Toy Baker, MD   25 mg at 10/18/16 0945  . metoprolol tartrate (LOPRESSOR)  tablet 50 mg  50 mg Oral BID Fay Records, MD   50 mg at 10/18/16 1208  . ondansetron (ZOFRAN) tablet 4 mg  4 mg Oral Q6H PRN Toy Baker, MD       Or  . ondansetron (ZOFRAN) injection 4 mg  4 mg Intravenous Q6H PRN Toy Baker, MD      . predniSONE (DELTASONE) tablet 40 mg  40 mg Oral Q breakfast Toy Baker, MD   40 mg at 10/18/16  0825  . rosuvastatin (CRESTOR) tablet 20 mg  20 mg Oral Daily Anastassia Doutova, MD   20 mg at 10/18/16 0945  . sodium chloride flush (NS) 0.9 % injection 3 mL  3 mL Intravenous Q12H Anastassia Doutova, MD      . sodium chloride flush (NS) 0.9 % injection 3 mL  3 mL Intravenous Q12H Anastassia Doutova, MD   3 mL at 10/18/16 0944  . sodium chloride flush (NS) 0.9 % injection 3 mL  3 mL Intravenous PRN Anastassia Doutova, MD      . traZODone (DESYREL) tablet 50 mg  50 mg Oral QHS PRN Anastassia Doutova, MD        REVIEW OF SYSTEMS:    10 Point review of Systems was done is negative except as noted above.  PHYSICAL EXAMINATION: ECOG PERFORMANCE STATUS: 2 - Symptomatic, <50% confined to bed  . Vitals:   09/15/16 1500  BP: (!) 155/75  Pulse: 86  Resp: 18  Temp: 97.8 F (36.6 C)   Filed Weights   09/15/16 1500  Weight: 260 lb (117.9 kg)   .Body mass index is 46.06 kg/m.  GENERAL:alert, in no acute distress and comfortable SKIN: skin color, texture, turgor are normal, no rashes or significant lesions EYES: normal, conjunctiva are pink and non-injected, sclera clear OROPHARYNX:no exudate, no erythema and lips, buccal mucosa, and tongue normal  NECK: supple, no JVD, thyroid normal size, non-tender, without nodularity LYMPH:  no palpable lymphadenopathy in the cervical, axillary or inguinal LUNGS: clear to auscultation with normal respiratory effort HEART: regular rate & rhythm,  no murmurs and no lower extremity edema ABDOMEN: abdomen soft, non-tender, normoactive bowel sounds  Musculoskeletal: no cyanosis of digits and no clubbing   PSYCH: alert & oriented x 3 with fluent speech NEURO: no focal motor/sensory deficits  LABORATORY DATA:  I have reviewed the data as listed  Component     Latest Ref Rng & Units 09/15/2016  WBC     3.9 - 10.3 10e3/uL 8.3  NEUT#     1.5 - 6.5 10e3/uL 4.3  Hemoglobin     11.6 - 15.9 g/dL 12.2  HCT     34.8 - 46.6 % 37.5  Platelets     145 - 400 10e3/uL 214  MCV     79.5 - 101.0 fL 92.6  MCH     25.1 - 34.0 pg 30.1  MCHC     31.5 - 36.0 g/dL 32.5  RBC     3.70 - 5.45 10e6/uL 4.05  RDW     11.2 - 14.5 % 14.8 (H)  lymph#     0.9 - 3.3 10e3/uL 3.2  MONO#     0.1 - 0.9 10e3/uL 0.6  Eosinophils Absolute     0.0 - 0.5 10e3/uL 0.1  Basophils Absolute     0.0 - 0.1 10e3/uL 0.0  NEUT%     38.4 - 76.8 % 51.8  LYMPH%     14.0 - 49.7 % 38.7  MONO%     0.0 - 14.0 % 7.7  EOS%     0.0 - 7.0 % 1.7  BASO%     0.0 - 2.0 % 0.1  Retic %     0.70 - 2.10 % 2.68 (H)  Retic Ct Abs     33.70 - 90.70 10e3/uL 108.54 (H)  Immature Retic Fract     1.60 - 10.00 % 9.70  Sodium     136 - 145 mEq/L 142  Potassium       3.5 - 5.1 mEq/L 3.8  Chloride     98 - 109 mEq/L 103  CO2     22 - 29 mEq/L 28  Glucose     70 - 140 mg/dl 196 (H)  BUN     7.0 - 26.0 mg/dL 31.4 (H)  Creatinine     0.6 - 1.1 mg/dL 1.3 (H)  Total Bilirubin     0.20 - 1.20 mg/dL 0.47  Alkaline Phosphatase     40 - 150 U/L 95  AST     5 - 34 U/L 19  ALT     0 - 55 U/L 20  Total Protein     6.4 - 8.3 g/dL 8.3  Albumin     3.5 - 5.0 g/dL 3.7  Calcium     8.4 - 10.4 mg/dL 10.2  Anion gap     3 - 11 mEq/L 11  EGFR     >90 ml/min/1.73 m2 48 (L)  Vitamin B12     211 - 946 pg/mL 435    RADIOGRAPHIC STUDIES: I have personally reviewed the radiological images as listed and agreed with the findings in the report. Dg Chest Port 1 View  Result Date: 10/17/2016 CLINICAL DATA:  Shortness of breath for 1 week, progressive. EXAM: PORTABLE CHEST 1 VIEW COMPARISON:  05/16/2014 FINDINGS: The heart is enlarged,  likely accentuated by portable technique and low lung volumes. No definite pulmonary edema. No large pleural effusion. Probable right infrahilar atelectasis. No pneumothorax. Grossly stable osseous structures. IMPRESSION: Enlarged cardiac silhouette, likely accentuated by AP portable technique and low lung volumes. Right infrahilar atelectasis. Electronically Signed   By: Melanie  Ehinger M.D.   On: 10/17/2016 22:58    ASSESSMENT & PLAN:   65 yo AAF with   1) Chronic normocytic Normochromic anemia. It has been present for several years and is quite mild with hgb in the mid 10 range and not expected to cause any symptoms at this time. B12 and  Folate WNL SPEP no evidence of M spike Elevated sed rate and CRP - suggest inflammatory state. ACE level WNL suggests against overt sarcoidosis progression. TSH wnl  No evidence of hemolysis. PBS - no overt findings concerning for MDS. Iron profile WNL.  2) h/o Sarcoidosis - treated with steroids in 1982 for about 1 yr. No recent treatment required.  Plan -patients hgb has normalized on labs today. --optimize other chronic medical issues. -continue to empirically take OTC B12 and B Complex 1 tab po daily to address any other B vitamin deficiencies.  #3  Patient Active Problem List   Diagnosis Date Noted  . Atrial fibrillation with RVR (HCC) 10/18/2016  . Acute on chronic diastolic CHF (congestive heart failure), NYHA class 2 (HCC) 10/18/2016  . Hypokalemia 10/18/2016  . Need for prophylactic vaccination against Streptococcus pneumoniae (pneumococcus) 08/20/2016  . Type 2 diabetes mellitus with complication, without long-term current use of insulin (HCC) 05/12/2016  . Edema 05/12/2016  . B12 deficiency 05/06/2016  . Normocytic anemia 04/15/2016  . Insomnia 01/21/2016  . Pelvic pain in female 08/06/2015  . Screening for breast cancer 08/06/2015  . History of anemia 08/02/2015  . Other specified nutritional anemias 08/02/2015  . Urinary pain  08/02/2015  . Need for prophylactic vaccination and inoculation against influenza 08/02/2015  . Obesity 08/02/2015  . Dyslipidemia 08/02/2015  . History of sarcoidosis 08/02/2015  . Generalized anxiety disorder 08/02/2015  . Other secondary chronic gout 08/02/2015  . Abnormal thyroid blood test 08/02/2015  .   Physical exam 08/02/2015  . Needs flu shot 08/02/2015  . Dysthymic disorder 08/02/2015  . Ataxia 08/22/2014  . Cardiomyopathy, secondary ([place]) 01/28/2011  . CHRONIC DIASTOLIC HEART FAILURE 78/46/9629  . Essential hypertension 09/02/2009  . Atrial fibrillation ([place]) 08/20/2009  . Lung nodule 08/20/2009  . CHRONIC VENOUS HYPERTENSION WITHOUT COMPS 11/21/2007   -continue f/u with PCP for management of other medical co-morbids. -counseled patient on importance of maintaining physical activity and trying to achieve ideal body weight.  Will discharge the patient back to her PCP at this time. Kindly reconsult [place] if needed.   All of the patients questions were answered with apparent satisfaction. The patient knows to call the clinic with any problems, questions or concerns.  I spent 15 minutes counseling the patient face to face. The total time spent in the appointment was 20 minutes and more than 50% was on counseling and direct patient cares.    Sullivan Lone MD [place] AAHIVMS [facility] Hematology/Oncology Physician [facility]  (Office):       [phone] (Work cell):  [phone] (Fax):           [phone]

## 2016-10-18 NOTE — Progress Notes (Signed)
Marland Kitchen    HEMATOLOGY/ONCOLOGY CLINIC NOTE  Date of Service: .05/06/2016  Patient Care Team: Carlena Hurl, PA-C as PCP - General (Family Medicine)  CHIEF COMPLAINTS/PURPOSE OF CONSULTATION:  Anemia  HISTORY OF PRESENTING ILLNESS:   Stephanie [name] is a wonderful 65 y.o. female who has been referred to [place] by Dr .Glade Lloyd, [facility], PA-C for evaluation and management of Anemia.  Patient has a hx of chronic medical co-morbidities including htn, diastolic CHF, sarcoidosis (diagnosed in 1982 with Xerophthamia/lung spots, treated with 1 yr of steroids at that time), obesity, IBS, gout reports increased fatigue for about 8 months.  Recent labs from 2023/02/06 shows some mild anemia with a hgb of 10.6, MCV of 97 with normal PLT and WBC counts. She notes that she has had some mild anemia atleast since 2010. She has previous had a TAH for excessive bleeding due to fibroids. Notes that her grandson passed away in Feb 06, 2023 this year and this has been weighing on her a lot. No chest pain/light headedness/overt SOB at rest.  No overt bleeding reported currently.  INTERVAL HISTORY  Patient is here to f/u on her lab testing results. No acute new symptoms.  MEDICAL HISTORY:  Past Medical History:  Diagnosis Date  . Anemia   . Ankle fracture, right    x 2  . Antineoplastic and immunosuppressive drugs causing adverse effect in therapeutic use   . Asthma   . Atrial fibrillation ([place])   . Chronic venous hypertension without complications   . Colon polyps   . Colon polyps 2009  . Coronary artery disease    no CAD by cath 11.2010  . Depressive disorder, not elsewhere classified   . Diastolic CHF, chronic ([place])   . Diverticulosis of colon 2009   colonoscopy  . Dyslipidemia   . Glucose intolerance (impaired glucose tolerance)    hgb AIC 6.1 09/02/2009  . Gout   . Hepatitis, unspecified    hx of  . Hx of hysterectomy 2002  . Hyperlipidemia   . Hypertension   . Irritable bowel syndrome   .  Lung nodule    50m RLL nodule on CT Chest  07/2009, resolved by 2011 CT  . Noncompliance   . Obesity   . Persistent atrial fibrillation ([place]    s/p DCCV 07/2009; Pradaxa Rx  . PONV (postoperative nausea and vomiting)   . Sarcoidosis ([place]    dx by eye exam  . Sleep disturbance 06/2013   sleep study, mild abnormality, no criteria for CPAP    SURGICAL HISTORY: Past Surgical History:  Procedure Laterality Date  . ABDOMINAL HYSTERECTOMY    . CARDIOVERSION     x 2  . CARDIOVERSION N/A 02/27/2013   Procedure: CARDIOVERSION;  Surgeon: BLelon Perla MD;  [facility];  Service: Cardiovascular;  Laterality: N/A;  . CHOLECYSTECTOMY    . COLONOSCOPY     Diverticulosis, colon polyps, repeat 2014, Dr. KDeatra Ina . ESOPHAGOGASTRODUODENOSCOPY N/A 05/16/2014   Procedure: ESOPHAGOGASTRODUODENOSCOPY (EGD);  Surgeon: SWonda Horner MD;  Location: [facility];  Service: Gastroenterology;  Laterality: N/A;  . HERNIA REPAIR  2009    SOCIAL HISTORY: Social History   Social History  . Marital status: Single    Spouse name: N/A  . Number of children: 5  . Years of education: college   Occupational History  . Not on file.   Social History Main Topics  . Smoking status: Never Smoker  . Smokeless tobacco: Never Used  . Alcohol use 0.0  oz/week     Comment: drinks 1 glass of wine every 2 weeks on average (occasional)  . Drug use: No  . Sexual activity: Not Currently    Birth control/ protection: Surgical   Other Topics Concern  . Not on file   Social History Narrative   Pt lives in [place] with her son.  Unemployed.  Right handed.     Education college       FAMILY HISTORY: Family History  Problem Relation Age of Onset  . Pneumonia Father     deceased  . Hypertension Father   . Cancer Father   . Multiple sclerosis Mother     hx  . Emphysema Other     runs in the family    ALLERGIES:  is allergic to albuterol; amiodarone; atorvastatin; livalo [pitavastatin]; and  propoxyphene n-acetaminophen.  MEDICATIONS:  No current facility-administered medications for this visit.    No current outpatient prescriptions on file.   Facility-Administered Medications Ordered in Other Visits  Medication Dose Route Frequency Provider Last Rate Last Dose  . 0.9 %  sodium chloride infusion  250 mL Intravenous PRN Toy Baker, MD      . acetaminophen (TYLENOL) tablet 650 mg  650 mg Oral Q6H PRN Toy Baker, MD       Or  . acetaminophen (TYLENOL) suppository 650 mg  650 mg Rectal Q6H PRN Toy Baker, MD      . ALPRAZolam Duanne Moron) tablet 1 mg  1 mg Oral QHS PRN Toy Baker, MD   1 mg at 10/18/16 0228  . dabigatran (PRADAXA) capsule 150 mg  150 mg Oral BID Toy Baker, MD   150 mg at 10/18/16 0946  . FLUoxetine (PROZAC) capsule 20 mg  20 mg Oral Daily Toy Baker, MD   20 mg at 10/18/16 0945  . furosemide (LASIX) injection 40 mg  40 mg Intravenous BID Toy Baker, MD   40 mg at 10/18/16 0823  . guaiFENesin (MUCINEX) 12 hr tablet 600 mg  600 mg Oral BID Toy Baker, MD   600 mg at 10/18/16 0946  . hydrALAZINE (APRESOLINE) 20 MG/ML injection           . insulin aspart (novoLOG) injection 0-15 Units  0-15 Units Subcutaneous TID WC Toy Baker, MD   3 Units at 10/18/16 1157  . insulin aspart (novoLOG) injection 0-5 Units  0-5 Units Subcutaneous QHS Toy Baker, MD      . ipratropium (ATROVENT) nebulizer solution 0.5 mg  0.5 mg Nebulization Q6H Toy Baker, MD   0.5 mg at 10/18/16 0959  . losartan (COZAAR) tablet 25 mg  25 mg Oral Daily Toy Baker, MD   25 mg at 10/18/16 0945  . metoprolol tartrate (LOPRESSOR) tablet 50 mg  50 mg Oral BID Fay Records, MD   50 mg at 10/18/16 1208  . ondansetron (ZOFRAN) tablet 4 mg  4 mg Oral Q6H PRN Toy Baker, MD       Or  . ondansetron (ZOFRAN) injection 4 mg  4 mg Intravenous Q6H PRN Toy Baker, MD      . predniSONE (DELTASONE) tablet 40 mg   40 mg Oral Q breakfast Toy Baker, MD   40 mg at 10/18/16 0825  . rosuvastatin (CRESTOR) tablet 20 mg  20 mg Oral Daily Toy Baker, MD   20 mg at 10/18/16 0945  . sodium chloride flush (NS) 0.9 % injection 3 mL  3 mL Intravenous Q12H Toy Baker, MD      .  sodium chloride flush (NS) 0.9 % injection 3 mL  3 mL Intravenous Q12H Toy Baker, MD   3 mL at 10/18/16 0944  . sodium chloride flush (NS) 0.9 % injection 3 mL  3 mL Intravenous PRN Toy Baker, MD      . traZODone (DESYREL) tablet 50 mg  50 mg Oral QHS PRN Toy Baker, MD        REVIEW OF SYSTEMS:    10 Point review of Systems was done is negative except as noted above.  PHYSICAL EXAMINATION: ECOG PERFORMANCE STATUS: 2 - Symptomatic, <50% confined to bed  . Vitals:   05/06/16 1004  BP: (!) 159/92  Pulse: 62  Resp: 18  Temp: 98.2 F (36.8 C)   Filed Weights   05/06/16 1004  Weight: 268 lb 1.6 oz (121.6 kg)   .Body mass index is 46.75 kg/m.  GENERAL:alert, in no acute distress and comfortable SKIN: skin color, texture, turgor are normal, no rashes or significant lesions EYES: normal, conjunctiva are pink and non-injected, sclera clear OROPHARYNX:no exudate, no erythema and lips, buccal mucosa, and tongue normal  NECK: supple, no JVD, thyroid normal size, non-tender, without nodularity LYMPH:  no palpable lymphadenopathy in the cervical, axillary or inguinal LUNGS: clear to auscultation with normal respiratory effort HEART: regular rate & rhythm,  no murmurs and no lower extremity edema ABDOMEN: abdomen soft, non-tender, normoactive bowel sounds  Musculoskeletal: no cyanosis of digits and no clubbing  PSYCH: alert & oriented x 3 with fluent speech NEURO: no focal motor/sensory deficits  LABORATORY DATA:  I have reviewed the data as listed  Component     Latest Ref Rng & Units 03/24/2016 04/15/2016  WBC     3.9 - 10.3 10e3/uL  6.0  NEUT#     1.5 - 6.5 10e3/uL  2.2    Hemoglobin     11.6 - 15.9 g/dL  10.5 (L)  HCT     34.8 - 46.6 %  32.1 (L)  Platelets     145 - 400 10e3/uL  187  MCV     79.5 - 101.0 fL  91.7  MCH     25.1 - 34.0 pg  30.0  MCHC     31.5 - 36.0 g/dL  32.7  RBC     3.70 - 5.45 10e6/uL  3.50 (L)  RDW     11.2 - 14.5 %  15.0 (H)  lymph#     0.9 - 3.3 10e3/uL  3.2  MONO#     0.1 - 0.9 10e3/uL  0.5  Eosinophils Absolute     0.0 - 0.5 10e3/uL  0.1  Basophils Absolute     0.0 - 0.1 10e3/uL  0.0  NEUT%     38.4 - 76.8 %  36.9 (L)  LYMPH%     14.0 - 49.7 %  52.6 (H)  MONO%     0.0 - 14.0 %  8.3  EOS%     0.0 - 7.0 %  2.0  BASO%     0.0 - 2.0 %  0.2  nRBC     0 - 0 %  0  Retic %     0.70 - 2.10 %  3.18 (H)  Retic Ct Abs     33.70 - 90.70 10e3/uL  111.30 (H)  Immature Retic Fract     1.60 - 10.00 %  6.50  Sodium     136 - 145 mEq/L  141  Potassium     3.5 -  5.1 mEq/L  3.7  Chloride     98 - 109 mEq/L  106  CO2     22 - 29 mEq/L  26  Glucose     70 - 140 mg/dl  97  BUN     7.0 - 26.0 mg/dL  20.0  Creatinine     0.6 - 1.1 mg/dL  0.9  Total Bilirubin     0.20 - 1.20 mg/dL  <0.30  Alkaline Phosphatase     40 - 150 U/L  87  AST     5 - 34 U/L  10  ALT     0 - 55 U/L  <9  Total Protein     6.4 - 8.3 g/dL  7.6  Albumin     3.5 - 5.0 g/dL  3.6  Calcium     8.4 - 10.4 mg/dL  9.8  Anion gap     3 - 11 mEq/L  9  EGFR     >90 ml/min/1.73 m2  78 (L)  IgG (Immunoglobin G), Serum     700 - 1600 mg/dL  1,173  IgA/Immunoglobulin A, Serum     87 - 352 mg/dL  327  IgM, Qn, Serum     26 - 217 mg/dL  117  Total Protein     6.0 - 8.5 g/dL  6.8  Albumin SerPl Elph-Mcnc     2.9 - 4.4 g/dL  3.6  Alpha 1     0.0 - 0.4 g/dL  0.2  Alpha2 Glob SerPl Elph-Mcnc     0.4 - 1.0 g/dL  0.7  B-Globulin SerPl Elph-Mcnc     0.7 - 1.3 g/dL  1.2  Gamma Glob SerPl Elph-Mcnc     0.4 - 1.8 g/dL  1.2  M Protein SerPl Elph-Mcnc     Not Observed g/dL  Not Observed  Globulin, Total     2.2 - 3.9 g/dL  3.2  Albumin/Glob  SerPl     0.7 - 1.7  1.2  IFE 1       Comment  Please Note (HCV):       Comment  Ig Kappa Free Light Chain     3.3 - 19.4 mg/L  24.4 (H)  Ig Lambda Free Light Chain     5.7 - 26.3 mg/L  17.5  Kappa/Lambda FluidC Ratio     0.26 - 1.65  1.39  Vitamin B12     211 - 911 pg/mL 502   Folate     >5.9 ng/mL 15.0   Sed Rate     0 - 40 mm/hr  63 (H)  CRP     0.0 - 4.9 mg/L  6.5 (H)  Ferritin     9 - 269 ng/ml  239  LDH     125 - 245 U/L  175  Haptoglobin     34 - 200 mg/dL  202 (H)  Erythropoietin     2.6 - 18.5 mIU/mL  23.8 (H)  Angio Convert Enzyme     14 - 82 U/L  23  TSH     0.308 - 3.960 m(IU)/L  1.913    RADIOGRAPHIC STUDIES: I have personally reviewed the radiological images as listed and agreed with the findings in the report. Dg Chest Port 1 View  Result Date: 10/17/2016 CLINICAL DATA:  Shortness of breath for 1 week, progressive. EXAM: PORTABLE CHEST 1 VIEW COMPARISON:  05/16/2014 FINDINGS: The heart is enlarged, likely accentuated by portable technique and low lung volumes.  No definite pulmonary edema. No large pleural effusion. Probable right infrahilar atelectasis. No pneumothorax. Grossly stable osseous structures. IMPRESSION: Enlarged cardiac silhouette, likely accentuated by AP portable technique and low lung volumes. Right infrahilar atelectasis. Electronically Signed   By: Jeb Levering M.D.   On: 10/17/2016 22:58    ASSESSMENT & PLAN:   65 yo AAF with   1) Chronic normocytic Normochromic anemia. It has been present for several years and is quite mild and not expected to cause any symptoms at this time. B12 and  Folate WNL SPEP no evidence of M spike Elevated sed rate and CRP - suggest inflammatory state. ACE level WNL suggests against overt sarcoidosis progression. TSH wnl  No evidence of hemolysis. PBS - no overt findings concerning for MDS. Iron profile WNL.  2) h/o Sarcoidosis - treated with steroids in 1982 for about 1 yr. No recent treatment  required.  Plan -no indication for PRBC transfusion or BM Bx at this time. -consider further w/u for possible chronic inflammatory disorder such as sarcoidosis with PCP -optimize other chronic medical issues. -would monitor CBC q54month. -reasonable to empirically take OTC B12 and B Complex 1 tab po daily to address any other B vitamin deficiencies.  #3  Patient Active Problem List   Diagnosis Date Noted  . Atrial fibrillation with RVR ([place] 10/18/2016  . Acute on chronic diastolic CHF (congestive heart failure), NYHA class 2 ([place] 10/18/2016  . Hypokalemia 10/18/2016  . Need for prophylactic vaccination against Streptococcus pneumoniae (pneumococcus) 08/20/2016  . Type 2 diabetes mellitus with complication, without long-term current use of insulin ([place] 05/12/2016  . Edema 05/12/2016  . B12 deficiency 05/06/2016  . Normocytic anemia 04/15/2016  . Insomnia 01/21/2016  . Pelvic pain in female 08/06/2015  . Screening for breast cancer 08/06/2015  . History of anemia 08/02/2015  . Other specified nutritional anemias 08/02/2015  . Urinary pain 08/02/2015  . Need for prophylactic vaccination and inoculation against influenza 08/02/2015  . Obesity 08/02/2015  . Dyslipidemia 08/02/2015  . History of sarcoidosis 08/02/2015  . Generalized anxiety disorder 08/02/2015  . Other secondary chronic gout 08/02/2015  . Abnormal thyroid blood test 08/02/2015  . Physical exam 08/02/2015  . Needs flu shot 08/02/2015  . Dysthymic disorder 08/02/2015  . Ataxia 08/22/2014  . Cardiomyopathy, secondary ([place] 01/28/2011  . CHRONIC DIASTOLIC HEART FAILURE 124/26/8341 . Essential hypertension 09/02/2009  . Atrial fibrillation ([place] 08/20/2009  . Lung nodule 08/20/2009  . CHRONIC VENOUS HYPERTENSION WITHOUT COMPS 11/21/2007   -continue f/u with PCP for management of other medical co-morbids. -counseled patient on importance of maintaining physical activity and trying to achieve ideal body  weight.  RTC with Dr KIrene Limboin 4 months with rpt labs  All of the patients questions were answered with apparent satisfaction. The patient knows to call the clinic with any problems, questions or concerns.  I spent 15 minutes counseling the patient face to face. The total time spent in the appointment was 20 minutes and more than 50% was on counseling and direct patient cares.    GSullivan LoneMD MMacombAAHIVMS SCarepartners Rehabilitation HospitalCAssumption Community HospitalHematology/Oncology Physician CLittle [facility] (Office):       [phone](Work cell):  [phone](Fax):           [phone]

## 2016-10-18 NOTE — H&P (Signed)
Stephanie [name] [id number] DOB: 13-Aug-1951 DOA: 10/17/2016     PCP: [name], PA-C   Outpatient Specialists: Cardiology [name] Patient coming from:   home Lives  With family    Chief Complaint: Palpitations and shortness of breath  HPI: Stephanie [name] is a 65 y.o. female with medical history significant of atrial fibrillation on Pradaxa,  morbid obesity, sarcoid and mild left ventricular hypertrophy, CAD, diastolic CHF, HLD, HTN or DM 2  Presented with one-week history of intermittent palpitations feels like her heart is racing out of her chest.  Patient denies any cough no fevers but some chills no chest pain but some chest pressure  Patient been having intermittent shortness of breath and been self treating herself of albuterol when necessary she's been having worsening palpitations. Patient reports his been having occasional cough nonproductive for past 1 week she hasn't felt well but only used albuterol for the first time yesterday and started to have worsening palpitations patient reports not being compliant with her Lasix but have tried to use it regular basis for past few days because of significant lower extremity edema  Palpitations associated with chest pressure and overall fatigue. So tired she is unable to walk up the stairs. Regarding pertinent Chronic problems:  Last cardiac catheterization was in 2012 showing nonobstructive coronary artery disease echogram in 2014 showed EF of 25-30 percent regarding history of atrial fibrillation patient undergone cardioversion in 2014 but rapidly reverted back to atrial fibrillation echogram has improved since catheter ablation was considered in 2015 but patient was not interested patient was rhythm managed with amiodarone that was discontinued because of his concern for toxicity She has chronic tremors followed by neurology for [place] thought to be possibly secondary to amiodarone. She has history of diabetes maintained  on metformin  IN ER:  Temp (24hrs), Avg:98.2 F (36.8 C), Min:98.2 F (36.8 C), Max:98.2 F (36.8 C)  RR 26 95% RA HRdownto100 ondiltiazem  BP 177/155 Troponin 0.03 BNP 722 Hemoglobin 11.4 which is baseline k 3.3 Chest x-ray worrisome for enlarged cardiac silhouette but may be secondary to AP portable film Following Medications were ordered in ER: Medications  diltiazem (CARDIZEM) 100 mg in dextrose 5 % 100 mL (1 mg/mL) infusion (not administered)  furosemide (LASIX) injection 40 mg (not administered)  diltiazem (CARDIZEM) injection 20 mg (20 mg Intravenous Given 10/17/16 2303)     ER provider discussed case with:  Cardiology fellow on-call commends admission no seen in the morning  Hospitalist was called for admission for A. fib with RVR associated with acute on chronic diastolic CHF  Review of Systems:    Pertinent positives include: shortness of breath at rest palpitations.  Constitutional:  No weight loss, night sweats, Fevers, chills, fatigue, weight loss  HEENT:  No headaches, Difficulty swallowing,Tooth/dental problems,Sore throat,  No sneezing, itching, ear ache, nasal congestion, post nasal drip,  Cardio-vascular:  No chest pain, Orthopnea, PND, anasarca, dizziness,no Bilateral lower extremity swelling  GI:  No heartburn, indigestion, abdominal pain, nausea, vomiting, diarrhea, change in bowel habits, loss of appetite, melena, blood in stool, hematemesis Resp:  no . No dyspnea on exertion, No excess mucus, no productive cough, No non-productive cough, No coughing up of blood.No change in color of mucus.No wheezing. Skin:  no rash or lesions. No jaundice GU:  no dysuria, change in color of urine, no urgency or frequency. No straining to urinate.  No flank pain.  Musculoskeletal:  No joint pain or no joint swelling. No decreased range of  motion. No back pain.  Psych:  No change in mood or affect. No depression or anxiety. No memory loss.  Neuro: no localizing  neurological complaints, no tingling, no weakness, no double vision, no gait abnormality, no slurred speech, no confusion  As per HPI otherwise 10 point review of systems negative.   Past Medical History: Past Medical History:  Diagnosis Date  . Anemia   . Ankle fracture, right    x 2  . Antineoplastic and immunosuppressive drugs causing adverse effect in therapeutic use   . Asthma   . Atrial fibrillation ([place])   . Chronic venous hypertension without complications   . Colon polyps   . Colon polyps 2009  . Coronary artery disease    no CAD by cath 11.2010  . Depressive disorder, not elsewhere classified   . Diastolic CHF, chronic ([place])   . Diverticulosis of colon 2009   colonoscopy  . Dyslipidemia   . Glucose intolerance (impaired glucose tolerance)    hgb AIC 6.1 09/02/2009  . Gout   . Hepatitis, unspecified    hx of  . Hx of hysterectomy 2002  . Hyperlipidemia   . Hypertension   . Irritable bowel syndrome   . Lung nodule    17mm RLL nodule on CT Chest  07/2009, resolved by 2011 CT  . Noncompliance   . Obesity   . Persistent atrial fibrillation ([place])    s/p DCCV 07/2009; Pradaxa Rx  . PONV (postoperative nausea and vomiting)   . Sarcoidosis ([place])    dx by eye exam  . Sleep disturbance 06/2013   sleep study, mild abnormality, no criteria for CPAP   Past Surgical History:  Procedure Laterality Date  . ABDOMINAL HYSTERECTOMY    . CARDIOVERSION     x 2  . CARDIOVERSION N/A 02/27/2013   Procedure: CARDIOVERSION;  Surgeon: Lelon Perla, MD;  Location: [facility] ENDOSCOPY;  Service: Cardiovascular;  Laterality: N/A;  . CHOLECYSTECTOMY    . COLONOSCOPY     Diverticulosis, colon polyps, repeat 2014, Dr. Deatra Ina  . ESOPHAGOGASTRODUODENOSCOPY N/A 05/16/2014   Procedure: ESOPHAGOGASTRODUODENOSCOPY (EGD);  Surgeon: Wonda Horner, MD;  Location: [facility] ORS;  Service: Gastroenterology;  Laterality: N/A;  . HERNIA REPAIR  2009     Social History:  Ambulatory  Independently      reports that she has never smoked. She has never used smokeless tobacco. She reports that she drinks alcohol. She reports that she does not use drugs.  Allergies:   Allergies  Allergen Reactions  . Albuterol     Palpitations, intolerance  . Amiodarone     adverse reaction: Tremor/gait disurbance  . Atorvastatin     Body aches and pains--stiffness.   [name] [Pitavastatin]     Body aches  . Propoxyphene N-Acetaminophen Nausea And Vomiting       Family History:   Family History  Problem Relation Age of Onset  . Pneumonia Father     deceased  . Hypertension Father   . Cancer Father   . Multiple sclerosis Mother     hx  . Emphysema Other     runs in the family    Medications: Prior to Admission medications   Medication Sig Start Date End Date Taking? Authorizing Provider  allopurinol (ZYLOPRIM) 300 MG tablet TAKE 1 TABLET BY MOUTH EVERY DAY Patient taking differently: TAKE 300 MG BY MOUTH DAILY AS NEEDED FOR GOUT 11/18/15  Yes Camelia Eng Tysinger, PA-C  ALPRAZolam Duanne Moron) 1 MG tablet Take 1 tablet (  1 mg total) by mouth at bedtime as needed for anxiety. 08/20/16  Yes Camelia Eng Tysinger, PA-C  colchicine (COLCRYS) 0.6 MG tablet Take 1 tablet (0.6 mg total) by mouth daily as needed. Reported on 01/21/2016 Patient taking differently: Take 0.6 mg by mouth daily as needed (GOUT). Reported on 01/21/2016 01/21/16  Yes Camelia Eng Tysinger, PA-C  dabigatran (PRADAXA) 150 MG CAPS capsule Take 1 capsule (150 mg total) by mouth 2 (two) times daily. 12/16/15  Yes Thompson Grayer, MD  FLUoxetine (PROZAC) 20 MG tablet TAKE 1 TABLET(20 MG) BY MOUTH DAILY 10/15/16  Yes Camelia Eng Tysinger, PA-C  furosemide (LASIX) 40 MG tablet TAKE 1 TABLET(40 MG) BY MOUTH DAILY AS NEEDED FOR FLUID RETENTION OR SWELLING 10/15/16  Yes Camelia Eng Tysinger, PA-C  losartan (COZAAR) 25 MG tablet Take 1 tablet (25 mg total) by mouth daily. 06/18/16 10/17/16 Yes Deboraha Sprang, MD  metFORMIN (GLUCOPHAGE) 500 MG tablet Take 1 tablet (500 mg  total) by mouth 2 (two) times daily with a meal. 1 tablet po BID for diabetes 05/13/16  Yes Camelia Eng Tysinger, PA-C  rosuvastatin (CRESTOR) 20 MG tablet Take 1 tablet (20 mg total) by mouth daily. 08/21/16  Yes Camelia Eng Tysinger, PA-C  traZODone (DESYREL) 50 MG tablet TAKE 1/2 TO 1 TABLET BY MOUTH EVERY NIGHT AT BEDTIME AS NEEDED FOR SLEEP 05/13/16  Yes Camelia Eng Tysinger, PA-C  VENTOLIN HFA 108 (90 Base) MCG/ACT inhaler INHALE 2 PUFFS INTO THE LUNGS EVERY 6 HOURS AS NEEDED FOR WHEEZING OR SHORTNESS OF BREATH 09/22/16  Yes Camelia Eng Tysinger, PA-C  B Complex-C (B-COMPLEX WITH VITAMIN C) tablet Take 1 tablet by mouth daily. Patient not taking: Reported on 10/17/2016 08/22/14   Larey Seat, MD  carvedilol (COREG) 6.25 MG tablet TAKE 1 TABLET(6.25 MG) BY MOUTH TWICE DAILY WITH A MEAL Patient not taking: Reported on 10/17/2016 06/10/15   Deboraha Sprang, MD  polyethylene glycol powder (GLYCOLAX/MIRALAX) powder Mix 17 grams in 8 oz of water 1-2 x daily Patient not taking: Reported on 10/17/2016 03/24/16   Manus Gunning, MD  potassium chloride SA (K-DUR,KLOR-CON) 20 MEQ tablet Take 1 tablet (20 mEq total) by mouth daily. Patient not taking: Reported on 10/17/2016 08/24/16   Deboraha Sprang, MD    Physical Exam: Patient Vitals for the past 24 hrs:  BP Temp Temp src Pulse Resp SpO2 Height Weight  10/17/16 2345 - - - - 22 97 % - -  10/17/16 2331 168/87 - - - 16 96 % - -  10/17/16 2330 - - - - 17 96 % - -  10/17/16 2315 - - - - 16 94 % - -  10/17/16 2307 136/88 - - 92 20 95 % - -  10/17/16 2300 - - - - 20 96 % - -  10/17/16 2229 171/97 98.2 F (36.8 C) Oral (!) 163 23 94 % 5\' 3"  (1.6 m) 117.9 kg (260 lb)    1. General:  in No Acute distress 2. Psychological: Alert and   Oriented 3. Head/ENT:   Dry Mucous Membranes                          Head Non traumatic, neck supple                          Normal  Dentition 4. SKIN:  decreased Skin turgor,  Skin clean Dry and intact no rash 5.  Heart: Rapid   irRegular rate and rhythm no  Murmur, Rub or gallop 6. Lungs:mild  wheezes basilar crackles   7. Abdomen: Soft,  non-tender, Non distended 8. Lower extremities: no clubbing, cyanosis, trace edema 9. Neurologically Grossly intact, moving all 4 extremities equally   10. MSK: Normal range of motion   body mass index is 46.06 kg/m.  Labs on Admission:   Labs on Admission: I have personally reviewed following labs and imaging studies  CBC:  Recent Labs Lab 10/17/16 2305  WBC 7.7  NEUTROABS 4.3  HGB 11.4*  HCT 33.8*  MCV 91.4  PLT [id number]   Basic Metabolic Panel:  Recent Labs Lab 10/17/16 2305  NA 140  K 3.3*  CL 104  CO2 27  GLUCOSE 155*  BUN 18  CREATININE 0.82  CALCIUM 9.3   GFR: Estimated Creatinine Clearance: 86 mL/min (by C-G formula based on SCr of 0.82 mg/dL). Liver Function Tests:  Recent Labs Lab 10/17/16 2305  AST 19  ALT 21  ALKPHOS 63  BILITOT 0.7  PROT 7.1  ALBUMIN 3.6   No results for input(s): LIPASE, AMYLASE in the last 168 hours. No results for input(s): AMMONIA in the last 168 hours. Coagulation Profile: No results for input(s): INR, PROTIME in the last 168 hours. Cardiac Enzymes: No results for input(s): CKTOTAL, CKMB, CKMBINDEX, TROPONINI in the last 168 hours. BNP (last 3 results) No results for input(s): PROBNP in the last 8760 hours. HbA1C: No results for input(s): HGBA1C in the last 72 hours. CBG: No results for input(s): GLUCAP in the last 168 hours. Lipid Profile: No results for input(s): CHOL, HDL, LDLCALC, TRIG, CHOLHDL, LDLDIRECT in the last 72 hours. Thyroid Function Tests: No results for input(s): TSH, T4TOTAL, FREET4, T3FREE, THYROIDAB in the last 72 hours. Anemia Panel: No results for input(s): VITAMINB12, FOLATE, FERRITIN, TIBC, IRON, RETICCTPCT in the last 72 hours.  Sepsis Labs: @LABRCNTIP (procalcitonin:4,lacticidven:4) )No results found for this or any previous visit (from the past 240 hour(s)).     UA  not  ordered  Lab Results  Component Value Date   HGBA1C 6.3 (H) 08/20/2016    Estimated Creatinine Clearance: 86 mL/min (by C-G formula based on SCr of 0.82 mg/dL).  BNP (last 3 results) No results for input(s): PROBNP in the last 8760 hours.   ECG REPORT  Independently reviewed Rate: 138  Rhythm: A. fib with RVR ST&T Change: No acute ischemic changes   QTC 438  Filed Weights   10/17/16 2229  Weight: 117.9 kg (260 lb)     Cultures:    Component Value Date/Time   SDES URINE, CLEAN CATCH 08/02/2009 1933   SPECREQUEST IMMUNE:NORM UT SYMPT:NEG 08/02/2009 1933   CULT NO GROWTH 08/02/2009 1933   REPTSTATUS 08/04/2009 FINAL 08/02/2009 1933     Radiological Exams on Admission: Dg Chest Port 1 View  Result Date: 10/17/2016 CLINICAL DATA:  Shortness of breath for 1 week, progressive. EXAM: PORTABLE CHEST 1 VIEW COMPARISON:  05/16/2014 FINDINGS: The heart is enlarged, likely accentuated by portable technique and low lung volumes. No definite pulmonary edema. No large pleural effusion. Probable right infrahilar atelectasis. No pneumothorax. Grossly stable osseous structures. IMPRESSION: Enlarged cardiac silhouette, likely accentuated by AP portable technique and low lung volumes. Right infrahilar atelectasis. Electronically Signed   By: Jeb Levering M.D.   On: 10/17/2016 22:58    Chart has been reviewed    Assessment/Plan   65 y.o. female with medical history significant of atrial fibrillation on Pradaxa,  morbid obesity,  sarcoid and mild left ventricular hypertrophy, CAD, diastolic CHF, HLD, HTN or DM 2 minute for A. fib with RVR associated with acute on chronic diastolic CHF  Present on Admission: . Atrial fibrillation with RVR (HCC) -  - Admit to step down on Cardizem drip       CHA2D-VASC score  5        Continue pradaxa                  Check TSH      Cycle cardiac enzymes      Obtain ECHO      Cardiology consult in AM   . Acute on chronic diastolic CHF  (congestive heart failure), NYHA class 2 (HCC) -- monitor on telemetry, cycle cardiac enzymes, obtain serial ECG, to evaluate for ischemia as a cause of heart failure  monitor daily weight  diurese with IV lasix and monitor orthostatics and creatinine to avoid over diuresis.  Order echogram to evaluate EF and valves Make sure patient is on ACE/ARBi    cardiology consult  . Dyslipidemia stable continue home medications . Essential hypertension . Type 2 diabetes mellitus with complication, without long-term current use of insulin (HCC) order sliding scale hold metformin . Hypokalemia replace and check magnesium level Reports history of  Asthma - currently mild wheezing unclear the eighth purely cardiogenic versus pulmonary in origin given the recent URI symptoms. Avoid albuterol as this have likely contributed to A. fib with RVR. If needed can use Xopenex ordered scheduled Atrovent. Initiate steroids low-dose given mild symptoms  Other plan as per orders.  DVT prophylaxis:  Pradaxa  Code Status:  FULL CODE as per patient    Family Communication:   Family   at  Bedside  plan of care was discussed with   Son,   Disposition Plan:    To home once workup is complete and patient is stable                              Consults called: Cardiology     Admission status:   Inpatient    Level of care    SDU      I have spent a total of 55 min on this admission   Rayanne Padmanabhan 10/18/2016, 12:56 AM    [facility]  Pager [phone]   after 2 AM please page floor coverage PA If 7AM-7PM, please contact the day team taking care of the patient  [email or web address]  Password [email or web address]

## 2016-10-19 DIAGNOSIS — I5021 Acute systolic (congestive) heart failure: Secondary | ICD-10-CM

## 2016-10-19 LAB — BASIC METABOLIC PANEL
ANION GAP: 7 (ref 5–15)
BUN: 26 mg/dL — ABNORMAL HIGH (ref 6–20)
CALCIUM: 9 mg/dL (ref 8.9–10.3)
CHLORIDE: 101 mmol/L (ref 101–111)
CO2: 29 mmol/L (ref 22–32)
Creatinine, Ser: 0.92 mg/dL (ref 0.44–1.00)
GFR calc non Af Amer: >60 mL/min (ref >60)
GLUCOSE: 211 mg/dL — AB (ref 65–99)
POTASSIUM: 3.5 mmol/L (ref 3.5–5.1)
Sodium: 137 mmol/L (ref 135–145)

## 2016-10-19 LAB — GLUCOSE, CAPILLARY
GLUCOSE-CAPILLARY: 240 mg/dL — AB (ref 65–99)
GLUCOSE-CAPILLARY: 238 mg/dL — AB (ref 65–99)
Glucose-Capillary: 151 mg/dL — ABNORMAL HIGH (ref 65–99)
Glucose-Capillary: 182 mg/dL — ABNORMAL HIGH (ref 65–99)

## 2016-10-19 LAB — T4, FREE: FREE T4: 1.68 ng/dL — AB (ref 0.61–1.12)

## 2016-10-19 MED ORDER — POTASSIUM CHLORIDE CRYS ER 20 MEQ PO TBCR
40.0000 meq | EXTENDED_RELEASE_TABLET | Freq: Once | ORAL | Status: AC
  Administered 2016-10-19: 40 meq via ORAL
  Filled 2016-10-19: qty 2

## 2016-10-19 MED ORDER — CARVEDILOL 6.25 MG PO TABS
6.2500 mg | ORAL_TABLET | Freq: Two times a day (BID) | ORAL | Status: DC
  Administered 2016-10-19: 6.25 mg via ORAL
  Filled 2016-10-19: qty 1

## 2016-10-19 NOTE — Progress Notes (Signed)
Subjective: Pt appears more comfortable than yesterday  Breathing fair  Tired   Objective: Vitals:   10/19/16 0400 10/19/16 0500 10/19/16 0600 10/19/16 0700  BP: 138/67 (!) 151/92 (!) 153/96 116/67  Pulse:      Resp: 15 15 19  (!) 26  Temp:      TempSrc:      SpO2: 98% 96% 96% 97%  Weight:      Height:       Weight change:   Intake/Output Summary (Last 24 hours) at 10/19/16 [id number] Last data filed at 10/18/16 2342  Gross per 24 hour  Intake           273.58 ml  Output             2875 ml  Net         -2601.42 ml    General: Awake but sleepy, in no acute distress Neck:  JVP is normal Heart: Irregular rate and rhythm, without murmurs, rubs, gallops.  Lungs: Clear to auscultation.  No rales or wheezes. Exemities:  No edema.   Neuro: Grossly intact, nonfocal.  Tele;  Afib  80s   Lab Results: Results for orders placed or performed during the hospital encounter of 10/17/16 (from the past 24 hour(s))  Glucose, capillary     Status: Abnormal   Collection Time: 10/18/16 11:50 AM  Result Value Ref Range   Glucose-Capillary 198 (H) 65 - 99 mg/dL  Influenza panel by PCR (type A & B, H1N1)     Status: None   Collection Time: 10/18/16 12:17 PM  Result Value Ref Range   Influenza A By PCR NEGATIVE NEGATIVE   Influenza B By PCR NEGATIVE NEGATIVE  Troponin I     Status: None   Collection Time: 10/18/16  1:35 PM  Result Value Ref Range   Troponin I <0.03 <0.03 ng/mL  Glucose, capillary     Status: Abnormal   Collection Time: 10/18/16  4:04 PM  Result Value Ref Range   Glucose-Capillary 237 (H) 65 - 99 mg/dL   Comment 1 Notify RN    Comment 2 Document in Chart   Glucose, capillary     Status: Abnormal   Collection Time: 10/18/16  9:42 PM  Result Value Ref Range   Glucose-Capillary 192 (H) 65 - 99 mg/dL   Comment 1 Notify RN    Comment 2 Document in Chart   Basic metabolic panel     Status: Abnormal   Collection Time: 10/19/16  4:11 AM  Result Value Ref Range   Sodium 137  135 - 145 mmol/L   Potassium 3.5 3.5 - 5.1 mmol/L   Chloride 101 101 - 111 mmol/L   CO2 29 22 - 32 mmol/L   Glucose, Bld 211 (H) 65 - 99 mg/dL   BUN 26 (H) 6 - 20 mg/dL   Creatinine, Ser 0.92 0.44 - 1.00 mg/dL   Calcium 9.0 8.9 - 10.3 mg/dL   GFR calc non Af Amer >60 >60 mL/min   GFR calc Af Amer >60 >60 mL/min   Anion gap 7 5 - 15  Glucose, capillary     Status: Abnormal   Collection Time: 10/19/16  8:11 AM  Result Value Ref Range   Glucose-Capillary 151 (H) 65 - 99 mg/dL   Comment 1 Notify RN    Comment 2 Document in Chart     Studies/Results: No results found.  Medications: Reviewed   @PROBHOSP @  Persistent atrial fib   Pt now with better  rate controlled  Talking to her she says that she did notice an improvement in her tremor when amiodarone was stopped  For now would recomm rate control  Hopefully this can be done as activity increases Will need to address long term Rx as outpt probably Continue pradaxa   2.  Chronic systolic CHF  Echo yestrday was difficultl   But LVEF is down  25%  RVEF down   May be due to tachycardia   WIll need to follow   For now would continue losartan  Switch lopressor to coreg (she had been on that previously)  Follow HR and BP   She is getting lasix bid  I would give for 1 more day  BUN sl increased   Cr stable    LOS: 1 day   Stephanie [name] 10/19/2016, 8:22 AM

## 2016-10-19 NOTE — Progress Notes (Addendum)
PROGRESS NOTE    Stephanie [name]  [id number] DOB: 12-Oct-1951 DOA: 10/17/2016 PCP: [name], PA-C   Brief Narrative: 66 y.o. female with medical history significant of atrial fibrillation on Pradaxa,  morbid obesity,sarcoid and mild left ventricular hypertrophy, CAD, diastolic CHF, HLD, HTN or DM 2 presented with one-week history of intermittent palpitations and SOB.  Assessment & Plan:   Active Problems:   Essential hypertension   Dyslipidemia   Type 2 diabetes mellitus with complication, without long-term current use of insulin (HCC)   Atrial fibrillation with RVR (HCC)   Acute on chronic diastolic CHF (congestive heart failure), NYHA class 2 (HCC)   Hypokalemia  # Atrial fibrillation with rapid ventricular response: -Required Cardizem drip on admission. Currently heart rate is controlled. Continue Coreg. On Pradaxa for systemic anticoagulation. Cardiology consult appreciated. -Needs outpatient anticoagulation on discharge. -Patient has very low TSH level. I will check free T3 and free T4 level. Hyperthyroidism might have contributed rapid ventricular response. Many treatment depending on test results.  #Acute systolic congestive heart failure: -Echocardiogram with EF of 25%. Continue losartan, Coreg. Cardiologist recommended to continue IV Lasix for 1 more day. Monitor electrolytes, serum creatinine level. Patient is negative by 2.6 L since admission. Low-salt diet.  #dyslipidemia. On Crestor  #Essential hypertension: Continue Coreg, Lasix, losartan. Monitor blood pressure.   #Type 2 diabetes: Monitor blood sugar level. Continue current insulin regimen.  #Hypokalemia: Serum potassium level 3.5 today. I will order oral 40 mEq of potassium chloride given currently on IV Lasix. Monitor labs and electrolytes.   DVT prophylaxis: Systemic anticoagulation. Code Status: Full code Family Communication: No family present at bedside Disposition Plan: Likely  discharge in 1-2 days. I will transfer patient out of step down.    Consultants:   Cardiology  Procedures: Echocardiogram Antimicrobials: None  Subjective: Patient was seen and examined at bedside. Reported weakness, dry cough. Denied chest pain, shortness of breath, nausea, vomiting or abdominal pain.   Objective: Vitals:   10/19/16 0800 10/19/16 0900 10/19/16 1000 10/19/16 1100  BP: 139/80 (!) 145/83 (!) 138/101 (!) 139/101  Pulse:      Resp: (!) 22 15 (!) 22 19  Temp: 97.2 F (36.2 C)     TempSrc: Oral     SpO2: 97%     Weight:  120.2 kg (264 lb 15.9 oz)    Height:        Intake/Output Summary (Last 24 hours) at 10/19/16 1213 Last data filed at 10/18/16 2342  Gross per 24 hour  Intake              243 ml  Output             1875 ml  Net            -1632 ml   Filed Weights   10/17/16 2229 10/18/16 0203 10/19/16 0900  Weight: 117.9 kg (260 lb) 119.6 kg (263 lb 10.7 oz) 120.2 kg (264 lb 15.9 oz)    Examination:  General exam: Appears calm and comfortable  Respiratory system: Clear to auscultation. Respiratory effort normal. No wheezing or crackle Cardiovascular system: S1 & S2 heard, RRR.  No pedal edema. Gastrointestinal system: Abdomen is nondistended, soft and nontender. Normal bowel sounds heard. Central nervous system: Alert and oriented. No focal neurological deficits. Extremities: Symmetric 5 x 5 power. Skin: No rashes, lesions or ulcers Psychiatry: Judgement and insight appear normal. Mood & affect appropriate.     Data Reviewed: I have personally reviewed following labs  and imaging studies  CBC:  Recent Labs Lab 10/17/16 2305 10/18/16 0254  WBC 7.7 7.7  NEUTROABS 4.3  --   HGB 11.4* 11.6*  HCT 33.8* 34.0*  MCV 91.4 91.2  PLT 221 [id number]   Basic Metabolic Panel:  Recent Labs Lab 10/17/16 2305 10/18/16 0254 10/19/16 0411  NA 140 142 137  K 3.3* 3.4* 3.5  CL 104 102 101  CO2 27 29 29   GLUCOSE 155* 175* 211*  BUN 18 17 26*  CREATININE  0.82 0.94 0.92  CALCIUM 9.3 9.4 9.0  MG 1.7 1.7  --   PHOS  --  3.9  --    GFR: Estimated Creatinine Clearance: 77.5 mL/min (by C-G formula based on SCr of 0.92 mg/dL). Liver Function Tests:  Recent Labs Lab 10/17/16 2305 10/18/16 0254  AST 19 20  ALT 21 19  ALKPHOS 63 70  BILITOT 0.7 0.7  PROT 7.1 7.4  ALBUMIN 3.6 3.9   No results for input(s): LIPASE, AMYLASE in the last 168 hours. No results for input(s): AMMONIA in the last 168 hours. Coagulation Profile: No results for input(s): INR, PROTIME in the last 168 hours. Cardiac Enzymes:  Recent Labs Lab 10/18/16 0254 10/18/16 0737 10/18/16 1335  TROPONINI <0.03 <0.03 <0.03   BNP (last 3 results) No results for input(s): PROBNP in the last 8760 hours. HbA1C: No results for input(s): HGBA1C in the last 72 hours. CBG:  Recent Labs Lab 10/18/16 1150 10/18/16 1604 10/18/16 2142 10/19/16 0811 10/19/16 1203  GLUCAP 198* 237* 192* 151* 240*   Lipid Profile: No results for input(s): CHOL, HDL, LDLCALC, TRIG, CHOLHDL, LDLDIRECT in the last 72 hours. Thyroid Function Tests:  Recent Labs  10/18/16 0254  TSH 0.014*   Anemia Panel: No results for input(s): VITAMINB12, FOLATE, FERRITIN, TIBC, IRON, RETICCTPCT in the last 72 hours. Sepsis Labs: No results for input(s): PROCALCITON, LATICACIDVEN in the last 168 hours.  Recent Results (from the past 240 hour(s))  Respiratory Panel by PCR     Status: None   Collection Time: 10/18/16  3:08 AM  Result Value Ref Range Status   Adenovirus NOT DETECTED NOT DETECTED Final   Coronavirus 229E NOT DETECTED NOT DETECTED Final   Coronavirus HKU1 NOT DETECTED NOT DETECTED Final   Coronavirus NL63 NOT DETECTED NOT DETECTED Final   Coronavirus OC43 NOT DETECTED NOT DETECTED Final   Metapneumovirus NOT DETECTED NOT DETECTED Final   Rhinovirus / Enterovirus NOT DETECTED NOT DETECTED Final   Influenza A NOT DETECTED NOT DETECTED Final   Influenza B NOT DETECTED NOT DETECTED Final    Parainfluenza Virus 1 NOT DETECTED NOT DETECTED Final   Parainfluenza Virus 2 NOT DETECTED NOT DETECTED Final   Parainfluenza Virus 3 NOT DETECTED NOT DETECTED Final   Parainfluenza Virus 4 NOT DETECTED NOT DETECTED Final   Respiratory Syncytial Virus NOT DETECTED NOT DETECTED Final   Bordetella pertussis NOT DETECTED NOT DETECTED Final   Chlamydophila pneumoniae NOT DETECTED NOT DETECTED Final   Mycoplasma pneumoniae NOT DETECTED NOT DETECTED Final    Comment: Performed at [facility]  MRSA PCR Screening     Status: None   Collection Time: 10/18/16  3:24 AM  Result Value Ref Range Status   MRSA by PCR NEGATIVE NEGATIVE Final    Comment:        The GeneXpert MRSA Assay (FDA approved for NASAL specimens only), is one component of a comprehensive MRSA colonization surveillance program. It is not intended to diagnose MRSA infection nor  to guide or monitor treatment for MRSA infections.          Radiology Studies: Dg Chest Port 1 View  Result Date: 10/17/2016 CLINICAL DATA:  Shortness of breath for 1 week, progressive. EXAM: PORTABLE CHEST 1 VIEW COMPARISON:  05/16/2014 FINDINGS: The heart is enlarged, likely accentuated by portable technique and low lung volumes. No definite pulmonary edema. No large pleural effusion. Probable right infrahilar atelectasis. No pneumothorax. Grossly stable osseous structures. IMPRESSION: Enlarged cardiac silhouette, likely accentuated by AP portable technique and low lung volumes. Right infrahilar atelectasis. Electronically Signed   By: Jeb Levering M.D.   On: 10/17/2016 22:58        Scheduled Meds: . carvedilol  6.25 mg Oral BID WC  . dabigatran  150 mg Oral BID  . FLUoxetine  20 mg Oral Daily  . furosemide  40 mg Intravenous BID  . guaiFENesin  600 mg Oral BID  . insulin aspart  0-15 Units Subcutaneous TID WC  . insulin aspart  0-5 Units Subcutaneous QHS  . losartan  25 mg Oral Daily  . predniSONE  40 mg Oral Q breakfast   . rosuvastatin  20 mg Oral Daily  . sodium chloride flush  3 mL Intravenous Q12H  . sodium chloride flush  3 mL Intravenous Q12H   Continuous Infusions:   LOS: 1 day    Russell Quinney Tanna Furry, MD [facility] Pager [phone]  If 7PM-7AM, please contact night-coverage [email or web address] Password [email or web address] 10/19/2016, 12:13 PM

## 2016-10-20 DIAGNOSIS — I4891 Unspecified atrial fibrillation: Secondary | ICD-10-CM

## 2016-10-20 DIAGNOSIS — I5023 Acute on chronic systolic (congestive) heart failure: Secondary | ICD-10-CM

## 2016-10-20 DIAGNOSIS — I5021 Acute systolic (congestive) heart failure: Secondary | ICD-10-CM

## 2016-10-20 LAB — BASIC METABOLIC PANEL
ANION GAP: 8 (ref 5–15)
BUN: 31 mg/dL — ABNORMAL HIGH (ref 6–20)
CHLORIDE: 100 mmol/L — AB (ref 101–111)
CO2: 27 mmol/L (ref 22–32)
CREATININE: 0.87 mg/dL (ref 0.44–1.00)
Calcium: 8.9 mg/dL (ref 8.9–10.3)
GFR calc non Af Amer: >60 mL/min (ref >60)
Glucose, Bld: 177 mg/dL — ABNORMAL HIGH (ref 65–99)
Potassium: 4.3 mmol/L (ref 3.5–5.1)
SODIUM: 135 mmol/L (ref 135–145)

## 2016-10-20 LAB — T3, FREE: T3, Free: 3.4 pg/mL (ref 2.0–4.4)

## 2016-10-20 LAB — GLUCOSE, CAPILLARY
GLUCOSE-CAPILLARY: 154 mg/dL — AB (ref 65–99)
GLUCOSE-CAPILLARY: 184 mg/dL — AB (ref 65–99)

## 2016-10-20 MED ORDER — CARVEDILOL 12.5 MG PO TABS: 12.5000 mg | ORAL_TABLET | Freq: Two times a day (BID) | ORAL | 0 refills | Status: DC

## 2016-10-20 MED ORDER — FUROSEMIDE 40 MG PO TABS: 40.0000 mg | ORAL_TABLET | Freq: Every day | ORAL | 0 refills | Status: DC

## 2016-10-20 MED ORDER — METHIMAZOLE 5 MG PO TABS
5.0000 mg | ORAL_TABLET | Freq: Once | ORAL | Status: DC
  Filled 2016-10-20: qty 1

## 2016-10-20 MED ORDER — METHIMAZOLE 5 MG PO TABS: ORAL_TABLET | ORAL | 0 refills | Status: DC

## 2016-10-20 MED ORDER — FUROSEMIDE 40 MG PO TABS
40.0000 mg | ORAL_TABLET | Freq: Every day | ORAL | Status: DC
  Administered 2016-10-20: 40 mg via ORAL
  Filled 2016-10-20: qty 1

## 2016-10-20 MED ORDER — CARVEDILOL 12.5 MG PO TABS
12.5000 mg | ORAL_TABLET | Freq: Two times a day (BID) | ORAL | Status: DC
  Administered 2016-10-20: 12.5 mg via ORAL
  Filled 2016-10-20: qty 1

## 2016-10-20 MED ORDER — POTASSIUM CHLORIDE CRYS ER 20 MEQ PO TBCR: 20.0000 meq | EXTENDED_RELEASE_TABLET | Freq: Every day | ORAL | 0 refills | Status: DC

## 2016-10-20 NOTE — Progress Notes (Signed)
Patient Name: Stephanie [name] Date of Encounter: 10/20/2016  Primary Cardiologist: Dr. Avis Epley Problem List     Principal Problem:   Acute on chronic systolic CHF (congestive heart failure) ([place]) Active Problems:   Essential hypertension   Dyslipidemia   Type 2 diabetes mellitus with complication, without long-term current use of insulin (HCC)   Atrial fibrillation with RVR (HCC)   Hypokalemia   Acute systolic CHF (congestive heart failure) (HCC)    Subjective   Breathing at baseline. Denies any chest discomfort or palpitations. Anxious to go home as she learned her nephew passed away over the holidays.   Inpatient Medications    Scheduled Meds: . carvedilol  12.5 mg Oral BID WC  . dabigatran  150 mg Oral BID  . FLUoxetine  20 mg Oral Daily  . furosemide  40 mg Oral Daily  . guaiFENesin  600 mg Oral BID  . insulin aspart  0-15 Units Subcutaneous TID WC  . insulin aspart  0-5 Units Subcutaneous QHS  . losartan  25 mg Oral Daily  . predniSONE  40 mg Oral Q breakfast  . rosuvastatin  20 mg Oral Daily  . sodium chloride flush  3 mL Intravenous Q12H  . sodium chloride flush  3 mL Intravenous Q12H   Continuous Infusions:  PRN Meds: sodium chloride, acetaminophen **OR** acetaminophen, ALPRAZolam, HYDROcodone-homatropine, ipratropium, ondansetron **OR** ondansetron (ZOFRAN) IV, sodium chloride flush, traZODone   Vital Signs    Vitals:   10/20/16 0300 10/20/16 0400 10/20/16 0500 10/20/16 0600  BP: 138/75 133/87 (!) 142/78 (!) 144/100  Pulse:      Resp: 17 (!) 21 16 15   Temp:  97.5 F (36.4 C)    TempSrc:  Oral    SpO2: 96% 94% 97% 96%  Weight:   268 lb 8.3 oz (121.8 kg)   Height:       No intake or output data in the 24 hours ending 10/20/16 0807 Filed Weights   10/18/16 0203 10/19/16 0900 10/20/16 0500  Weight: 263 lb 10.7 oz (119.6 kg) 264 lb 15.9 oz (120.2 kg) 268 lb 8.3 oz (121.8 kg)    Physical Exam    GEN: Well nourished, African American female  appearing in no acute distress.  HEENT: Grossly normal.  Neck: Supple, no JVD, carotid bruits, or masses. Cardiac: Irregularly irregular, no murmurs, rubs, or gallops. No clubbing or cyanosis. No pitting edema appreciated.  Radials/DP/PT 2+ and equal bilaterally.  Respiratory:  Respirations regular and unlabored, clear to auscultation bilaterally. GI: Soft, nontender, nondistended, BS + x 4. MS: no deformity or atrophy. Skin: warm and dry, no rash. Neuro:  Strength and sensation are intact. Psych: AAOx3.  Normal affect.  Labs    CBC  Recent Labs  10/17/16 2305 10/18/16 0254  WBC 7.7 7.7  NEUTROABS 4.3  --   HGB 11.4* 11.6*  HCT 33.8* 34.0*  MCV 91.4 91.2  PLT 221 [id number]   Basic Metabolic Panel  Recent Labs  10/17/16 2305 10/18/16 0254 10/19/16 0411 10/20/16 0318  NA 140 142 137 135  K 3.3* 3.4* 3.5 4.3  CL 104 102 101 100*  CO2 27 29 29 27   GLUCOSE 155* 175* 211* 177*  BUN 18 17 26* 31*  CREATININE 0.82 0.94 0.92 0.87  CALCIUM 9.3 9.4 9.0 8.9  MG 1.7 1.7  --   --   PHOS  --  3.9  --   --    Liver Function Tests  Recent Labs  10/17/16 2305  10/18/16 0254  AST 19 20  ALT 21 19  ALKPHOS 63 70  BILITOT 0.7 0.7  PROT 7.1 7.4  ALBUMIN 3.6 3.9   No results for input(s): LIPASE, AMYLASE in the last 72 hours. Cardiac Enzymes  Recent Labs  10/18/16 0254 10/18/16 0737 10/18/16 1335  TROPONINI <0.03 <0.03 <0.03   BNP Invalid input(s): POCBNP D-Dimer No results for input(s): DDIMER in the last 72 hours. Hemoglobin A1C No results for input(s): HGBA1C in the last 72 hours. Fasting Lipid Panel No results for input(s): CHOL, HDL, LDLCALC, TRIG, CHOLHDL, LDLDIRECT in the last 72 hours. Thyroid Function Tests  Recent Labs  10/18/16 0254 10/19/16 1253  TSH 0.014*  --   T3FREE  --  3.4    Telemetry    Atrial fibrillation, HR in 70's - 90's. - Personally Reviewed  ECG    No new tracings.   Radiology    No results found.  Cardiac Studies    Echocardiogram: 10/18/2016 Study Conclusions  - Left ventricle: The cavity size was mildly dilated. There was   moderate concentric hypertrophy. Systolic function was severely   reduced. The estimated ejection fraction was 25%. Diffuse   hypokinesis. The study is not technically sufficient to allow   evaluation of LV diastolic function. - Mitral valve: Calcified annulus. Mildly thickened leaflets .   There was mild regurgitation. - Left atrium: The atrium was severely dilated. - Right ventricle: Systolic function was moderately reduced. - Right atrium: The atrium was mildly dilated. - Tricuspid valve: There was mild regurgitation.  Patient Profile     66 yo female w/ PMH of nonischemic cardiomyopathy (EF 25-30% by echo in 2014, nonobstructive CAD by cath in 0000000), chronic systolic CHF, sarcoid, and PAF (previously on Amiodarone but discontinued 2ry to tremors) who presented to [facility] ED on 10/18/2016 for evaluation of palpitations and worsening edema. Admitted for CHF exacerbation. Cards consulted for atrial fibrillation with RVR.  Assessment & Plan    1. Persistent Atrial Fibrillation - Required IV Cardizem on admission. Lopressor switched to Coreg with improvement in her HR. Will increase Coreg dosing to 12.5mg  BID. Previously on Amiodarone but this was discontinued secondary to Neurological side effects.  - This patients CHA2DS2-VASc Score and unadjusted Ischemic Stroke Rate (% per year) is equal to 4.8 % stroke rate/year from a score of 4 (CHF, HTN, DM, Female). Continue Pradaxa for anticoagulation.   2. Acute on Chronic Systolic CHF - known reduced EF of 25% by echo. BNP elevated to 722 on admission. Started on IV Lasix 40mg  BID at time of admission with a recorded output of -2.6L but there have been several episodes of incontinence. Weights have been recorded as increasing (260 --> 268 lbs). Creatinine stable at 0.87. - she appears to be at baseline on exam. Will switch to PO Lasix  40mg  daily.  - continue Coreg and ARB. Consider the addition of Spironolactone as an outpatient.   3. Nonischemic Cardiomyopathy - nonobstructive CAD by cath in 2012 with EF 25% at that time.  - EF at 25% this admission.   4. HTN - BP at 116/73 - 145/120 in the past 24 hours. Recorded as 160's/ 140's during this encounter but cuff is noted to be at her wrist and likely not an accurate recording.  - continue Losartan 25mg  daily. Increase Coreg from 6.25mg  BID to 12.5mg  BID.   Signed, Erma Heritage, PA  10/20/2016, 8:07 AM

## 2016-10-20 NOTE — Progress Notes (Signed)
Nutrition Brief Note  Patient identified for low Braden.   Wt Readings from Last 15 Encounters:  10/20/16 268 lb 8.3 oz (121.8 kg)  09/15/16 260 lb (117.9 kg)  08/20/16 265 lb (120.2 kg)  06/18/16 266 lb 6.4 oz (120.8 kg)  05/12/16 268 lb (121.6 kg)  05/06/16 268 lb 1.6 oz (121.6 kg)  04/15/16 268 lb 12.8 oz (121.9 kg)  03/24/16 267 lb 2 oz (121.2 kg)  01/21/16 269 lb (122 kg)  11/11/15 266 lb 12.8 oz (121 kg)  10/03/15 277 lb (125.6 kg)  08/02/15 273 lb (123.8 kg)  05/07/15 270 lb (122.5 kg)  03/04/15 269 lb (122 kg)  01/09/15 272 lb (123.4 kg)    Body mass index is 47.57 kg/m. Patient meets criteria for morbid obesity based on current BMI. Skin WDL.   Current diet order is Carb Modified. For breakfast this AM, pt had an omelet, toast, peaches, and apple juice.  Labs and medications reviewed. MD note from yesterday stated possible d/c home in 1-2 days.   No nutrition interventions warranted at this time. If nutrition issues arise, please consult RD.     Jarome Matin, MS, RD, LDN, [facility] Inpatient Clinical Dietitian Pager # [phone] After hours/weekend pager # [phone]

## 2016-10-20 NOTE — Progress Notes (Signed)
Pt being discharged home with sons. Discharge instructions given and all questions answered.

## 2016-10-20 NOTE — Discharge Summary (Signed)
Physician Discharge Summary  Stephanie [name] [id number] DOB: 1951/04/28 DOA: 10/17/2016  PCP: [name], PA-C  Admit date: 10/17/2016 Discharge date: 10/20/2016  Admitted From: Home Disposition: Home   Recommendations for Outpatient Follow-up:  1. Follow up with PCP in 1-2 weeks 2. Please obtain BMP/CBC in one week 3. Follow up in heart failure clinic in next week: Per cardiology, continuing ARB and would consider adding aldactone as outpatient.  4. Monitor HR: Discharged on coreg 12.5mg  BID 5. Follow up with endocrinology for hyperthyroidism on 11/21/2015. Started methimazole 10mg  qAM and 5mg  qPM and continued beta blocker.  6. Please follow up on the following pending results:  1. Thyroid antibodies/TSI  Home Health: None Equipment/Devices: None Discharge Condition: Stable CODE STATUS: Full Diet recommendation: Heart healthy  Brief/Interim Summary: Stephanie [name] is a 66 y.o.femalewith a history of atrial fibrillation on Pradaxa, morbid obesity,sarcoid and mild left ventricular hypertrophy, CAD, diastolic CHF, HLD, HTN and T2DM who presented with one-week history of intermittent palpitations and SOB. She was found to be in AFib w/RVR, started on cardizem infusion and admitted to SDU. Echocardiogram was difficult, but showed depressed EF around 25%. Cardiology was consulted and guided diuresis and rate-control medications. Rate has improved on coreg and the patient appears euvolemic after IV diuresis. TSH was checked, found to be low at 0.014 and subsequent free T4 was elevated at 1.68, free T3 normal at 3.4. After discussion by phone with Dr. Cruzita Lederer, endocrinology, the patient was started on methimazole and will follow up in 1 month.  Discharge Diagnoses:  Principal Problem:   Acute on chronic systolic CHF (congestive heart failure) (HCC) Active Problems:   Essential hypertension   Dyslipidemia   Type 2 diabetes mellitus with complication, without long-term  current use of insulin (HCC)   Atrial fibrillation with RVR (HCC)   Hypokalemia   Acute systolic CHF (congestive heart failure) (HCC)  Persistent Atrial Fibrillation: Required IV Cardizem on admission. CHADSVASc 4. Previously on amiodarone, stopped in ?2016 due to neuro toxicity. - Rate control titrated to carvedilol 12.5mg  BID with improvement in rate and BP.  - Continued pradaxa  Acute on Chronic HFrEF: EF of 25% by echo, due to NICM (nonobstructive cath 2012 with stable EF since then). BNP 722 on admission. I/O limited by incontinence. Weights increasing but clinically pt improved with diuresis. - Discharged on lasix 40mg  daily (and daily potassium) - Continue Coreg and ARB. Consider the addition of Spironolactone as an outpatient.   Hyperthyroidism: New Dx based on TSH 0.014, free Tr 1.68, and normal free T3. This pattern could be due to amiodarone toxicity, though this would be a very late effect. No nodularity to thyroid on palpation.  - Discussed with Dr. Cruzita Lederer with whom the pt will follow up - Follow up thyroid Ab's/TSI - Will need uptake scan  Discharge Instructions Discharge Instructions    ([place]) Call MD:  Anytime you have any of the following symptoms: 1) 3 pound weight gain in 24 hours or 5 pounds in 1 week 2) shortness of breath, with or without a dry hacking cough 3) swelling in the hands, feet or stomach 4) if you have to sleep on extra pillows at night in order to breathe.    Complete by:  As directed    Diet - low sodium heart healthy    Complete by:  As directed    Discharge instructions    Complete by:  As directed    You were admitted with trouble breathing and  rapid atrial fibrillation. Treatment with rate-control medication has been effective and you are improved enough for discharge today with the following recommendations:  - Take coreg 12.5mg  twice daily - Continue taking pradaxa.  - Start taking lasix 40mg  every day (not just as needed)  and take a potassium pill everyday as well because lasix lowers your potassium. - Call Dr. Olin Pia office to schedule a follow up appointment if you are not contacted soon by them. - If your symptoms return, seek medical attention right away.   It is suspected that you have hyperthyroidism:  - Take methimazole twice daily as directed (10mg  in the morning, 5mg  at night) - Follow up with Dr. Cruzita Lederer, endocrinologist, on November 20, 2016 at 2:00pm for ongoing treatment.   Increase activity slowly    Complete by:  As directed      Allergies as of 10/20/2016      Reactions   Albuterol    Palpitations, intolerance   Amiodarone    adverse reaction: Tremor/gait disurbance   Atorvastatin    Body aches and pains--stiffness.    Livalo [pitavastatin]    Body aches   Propoxyphene N-acetaminophen Nausea And Vomiting      Medication List    STOP taking these medications   B-complex with vitamin C tablet   polyethylene glycol powder powder Commonly known as:  GLYCOLAX/MIRALAX     TAKE these medications   allopurinol 300 MG tablet Commonly known as:  ZYLOPRIM TAKE 1 TABLET BY MOUTH EVERY DAY What changed:  See the new instructions.   ALPRAZolam 1 MG tablet Commonly known as:  XANAX Take 1 tablet (1 mg total) by mouth at bedtime as needed for anxiety.   carvedilol 12.5 MG tablet Commonly known as:  COREG Take 1 tablet (12.5 mg total) by mouth 2 (two) times daily with a meal. What changed:  See the new instructions.   colchicine 0.6 MG tablet Commonly known as:  COLCRYS Take 1 tablet (0.6 mg total) by mouth daily as needed. Reported on 01/21/2016 What changed:  reasons to take this  additional instructions   dabigatran 150 MG Caps capsule Commonly known as:  PRADAXA Take 1 capsule (150 mg total) by mouth 2 (two) times daily.   FLUoxetine 20 MG tablet Commonly known as:  PROZAC TAKE 1 TABLET(20 MG) BY MOUTH DAILY   furosemide 40 MG tablet Commonly known as:  LASIX Take 1  tablet (40 mg total) by mouth daily. What changed:  See the new instructions.   losartan 25 MG tablet Commonly known as:  COZAAR Take 1 tablet (25 mg total) by mouth daily.   metFORMIN 500 MG tablet Commonly known as:  GLUCOPHAGE Take 1 tablet (500 mg total) by mouth 2 (two) times daily with a meal. 1 tablet po BID for diabetes   methimazole 5 MG tablet Commonly known as:  TAPAZOLE take 2 tabs (10mg ) in the morning and 1 tab (5mg ) at night   potassium chloride SA 20 MEQ tablet Commonly known as:  K-DUR,KLOR-CON Take 1 tablet (20 mEq total) by mouth daily.   rosuvastatin 20 MG tablet Commonly known as:  CRESTOR Take 1 tablet (20 mg total) by mouth daily.   traZODone 50 MG tablet Commonly known as:  DESYREL TAKE 1/2 TO 1 TABLET BY MOUTH EVERY NIGHT AT BEDTIME AS NEEDED FOR SLEEP   VENTOLIN HFA 108 (90 Base) MCG/ACT inhaler Generic drug:  albuterol INHALE 2 PUFFS INTO THE LUNGS EVERY 6 HOURS AS NEEDED FOR WHEEZING OR SHORTNESS  OF BREATH      Follow-up Information    Chana Bode [facility], PA-C Follow up.   Specialty:  Family Medicine Contact information: [place] [address] [phone]        [name], MD Follow up.   Specialty:  Cardiology Contact information: [id number] [place] [address] [phone]        Philemon Kingdom, MD Follow up on 11/20/2016.   Specialty:  Internal Medicine Why:  2:00pm Contact information: [address] [place] [address] [phone]          Allergies  Allergen Reactions  . Albuterol     Palpitations, intolerance  . Amiodarone     adverse reaction: Tremor/gait disurbance  . Atorvastatin     Body aches and pains--stiffness.   [name] [Pitavastatin]     Body aches  . Propoxyphene N-Acetaminophen Nausea And Vomiting    Consultations:  Cardiology  Procedures/Studies: Dg Chest Port 1 View  Result Date: 10/17/2016 CLINICAL DATA:   Shortness of breath for 1 week, progressive. EXAM: PORTABLE CHEST 1 VIEW COMPARISON:  05/16/2014 FINDINGS: The heart is enlarged, likely accentuated by portable technique and low lung volumes. No definite pulmonary edema. No large pleural effusion. Probable right infrahilar atelectasis. No pneumothorax. Grossly stable osseous structures. IMPRESSION: Enlarged cardiac silhouette, likely accentuated by AP portable technique and low lung volumes. Right infrahilar atelectasis. Electronically Signed   By: Jeb Levering M.D.   On: 10/17/2016 22:58     Echocardiogram 10/18/2016: Study Conclusions  - Left ventricle: The cavity size was mildly dilated. There was   moderate concentric hypertrophy. Systolic function was severely   reduced. The estimated ejection fraction was 25%. Diffuse   hypokinesis. The study is not technically sufficient to allow   evaluation of LV diastolic function. - Mitral valve: Calcified annulus. Mildly thickened leaflets .   There was mild regurgitation. - Left atrium: The atrium was severely dilated. - Right ventricle: Systolic function was moderately reduced. - Right atrium: The atrium was mildly dilated. - Tricuspid valve: There was mild regurgitation.  Subjective: Patient feels better, no dyspnea, chest pain, palpitations, dizziness. Legs no longer swollen. Wants to go home.  Discharge Exam: Vitals:   10/20/16 0900 10/20/16 1200  BP: (!) 129/104   Pulse:    Resp: 16   Temp:  97.6 F (36.4 C)   Vitals:   10/20/16 0600 10/20/16 0800 10/20/16 0900 10/20/16 1200  BP: (!) 144/100  (!) 129/104   Pulse:      Resp: 15  16   Temp:  97.2 F (36.2 C)  97.6 F (36.4 C)  TempSrc:  Oral  Oral  SpO2: 96%     Weight:      Height:       General: Pt is alert, awake, not in acute distress Cardiovascular: Irreg irreg, rate in 70-80s, S1/S2 +, no rubs, no gallops Respiratory: Nonlabored, CTA bilaterally, no wheezing, no rhonchi Abdominal: Soft, NT, ND, bowel sounds  + Extremities: no edema, no cyanosis  The results of significant diagnostics from this hospitalization (including imaging, microbiology, ancillary and laboratory) are listed below for reference.    Microbiology: Recent Results (from the past 240 hour(s))  Respiratory Panel by PCR     Status: None   Collection Time: 10/18/16  3:08 AM  Result Value Ref Range Status   Adenovirus NOT DETECTED NOT DETECTED Final   Coronavirus 229E NOT DETECTED NOT DETECTED Final   Coronavirus HKU1  NOT DETECTED NOT DETECTED Final   Coronavirus NL63 NOT DETECTED NOT DETECTED Final   Coronavirus OC43 NOT DETECTED NOT DETECTED Final   Metapneumovirus NOT DETECTED NOT DETECTED Final   Rhinovirus / Enterovirus NOT DETECTED NOT DETECTED Final   Influenza A NOT DETECTED NOT DETECTED Final   Influenza B NOT DETECTED NOT DETECTED Final   Parainfluenza Virus 1 NOT DETECTED NOT DETECTED Final   Parainfluenza Virus 2 NOT DETECTED NOT DETECTED Final   Parainfluenza Virus 3 NOT DETECTED NOT DETECTED Final   Parainfluenza Virus 4 NOT DETECTED NOT DETECTED Final   Respiratory Syncytial Virus NOT DETECTED NOT DETECTED Final   Bordetella pertussis NOT DETECTED NOT DETECTED Final   Chlamydophila pneumoniae NOT DETECTED NOT DETECTED Final   Mycoplasma pneumoniae NOT DETECTED NOT DETECTED Final    Comment: Performed at [facility]  MRSA PCR Screening     Status: None   Collection Time: 10/18/16  3:24 AM  Result Value Ref Range Status   MRSA by PCR NEGATIVE NEGATIVE Final    Comment:        The GeneXpert MRSA Assay (FDA approved for NASAL specimens only), is one component of a comprehensive MRSA colonization surveillance program. It is not intended to diagnose MRSA infection nor to guide or monitor treatment for MRSA infections.      Labs: BNP (last 3 results)  Recent Labs  10/17/16 2306  BNP [id number]*   Basic Metabolic Panel:  Recent Labs Lab 10/17/16 2305 10/18/16 0254 10/19/16 0411  10/20/16 0318  NA 140 142 137 135  K 3.3* 3.4* 3.5 4.3  CL 104 102 101 100*  CO2 27 29 29 27   GLUCOSE 155* 175* 211* 177*  BUN 18 17 26* 31*  CREATININE 0.82 0.94 0.92 0.87  CALCIUM 9.3 9.4 9.0 8.9  MG 1.7 1.7  --   --   PHOS  --  3.9  --   --    Liver Function Tests:  Recent Labs Lab 10/17/16 2305 10/18/16 0254  AST 19 20  ALT 21 19  ALKPHOS 63 70  BILITOT 0.7 0.7  PROT 7.1 7.4  ALBUMIN 3.6 3.9   No results for input(s): LIPASE, AMYLASE in the last 168 hours. No results for input(s): AMMONIA in the last 168 hours. CBC:  Recent Labs Lab 10/17/16 2305 10/18/16 0254  WBC 7.7 7.7  NEUTROABS 4.3  --   HGB 11.4* 11.6*  HCT 33.8* 34.0*  MCV 91.4 91.2  PLT 221 217   Cardiac Enzymes:  Recent Labs Lab 10/18/16 0254 10/18/16 0737 10/18/16 1335  TROPONINI <0.03 <0.03 <0.03   BNP: Invalid input(s): POCBNP CBG:  Recent Labs Lab 10/19/16 1203 10/19/16 1700 10/19/16 2224 10/20/16 0734 10/20/16 1124  GLUCAP 240* 238* 182* 154* 184*   D-Dimer No results for input(s): DDIMER in the last 72 hours. Hgb A1c No results for input(s): HGBA1C in the last 72 hours. Lipid Profile No results for input(s): CHOL, HDL, LDLCALC, TRIG, CHOLHDL, LDLDIRECT in the last 72 hours. Thyroid function studies  Recent Labs  10/18/16 0254 10/19/16 1253  TSH 0.014*  --   T3FREE  --  3.4   Anemia work up No results for input(s): VITAMINB12, FOLATE, FERRITIN, TIBC, IRON, RETICCTPCT in the last 72 hours. Urinalysis    Component Value Date/Time   COLORURINE COLORLESS (A) 10/18/2016 0306   APPEARANCEUR CLEAR 10/18/2016 0306   LABSPEC 1.004 (L) 10/18/2016 0306   PHURINE 7.0 10/18/2016 0306   GLUCOSEU NEGATIVE 10/18/2016 0306  HGBUR NEGATIVE 10/18/2016 0306   HGBUR negative 09/02/2009 1428   BILIRUBINUR NEGATIVE 10/18/2016 0306   BILIRUBINUR n 08/02/2015 1125   KETONESUR NEGATIVE 10/18/2016 0306   PROTEINUR NEGATIVE 10/18/2016 0306   UROBILINOGEN negative 08/02/2015 1125    UROBILINOGEN 0.2 08/16/2011 2150   NITRITE NEGATIVE 10/18/2016 0306   LEUKOCYTESUR NEGATIVE 10/18/2016 0306   Sepsis Labs Invalid input(s): PROCALCITONIN,  WBC,  LACTICIDVEN Microbiology Recent Results (from the past 240 hour(s))  Respiratory Panel by PCR     Status: None   Collection Time: 10/18/16  3:08 AM  Result Value Ref Range Status   Adenovirus NOT DETECTED NOT DETECTED Final   Coronavirus 229E NOT DETECTED NOT DETECTED Final   Coronavirus HKU1 NOT DETECTED NOT DETECTED Final   Coronavirus NL63 NOT DETECTED NOT DETECTED Final   Coronavirus OC43 NOT DETECTED NOT DETECTED Final   Metapneumovirus NOT DETECTED NOT DETECTED Final   Rhinovirus / Enterovirus NOT DETECTED NOT DETECTED Final   Influenza A NOT DETECTED NOT DETECTED Final   Influenza B NOT DETECTED NOT DETECTED Final   Parainfluenza Virus 1 NOT DETECTED NOT DETECTED Final   Parainfluenza Virus 2 NOT DETECTED NOT DETECTED Final   Parainfluenza Virus 3 NOT DETECTED NOT DETECTED Final   Parainfluenza Virus 4 NOT DETECTED NOT DETECTED Final   Respiratory Syncytial Virus NOT DETECTED NOT DETECTED Final   Bordetella pertussis NOT DETECTED NOT DETECTED Final   Chlamydophila pneumoniae NOT DETECTED NOT DETECTED Final   Mycoplasma pneumoniae NOT DETECTED NOT DETECTED Final    Comment: Performed at [facility]  MRSA PCR Screening     Status: None   Collection Time: 10/18/16  3:24 AM  Result Value Ref Range Status   MRSA by PCR NEGATIVE NEGATIVE Final    Comment:        The GeneXpert MRSA Assay (FDA approved for NASAL specimens only), is one component of a comprehensive MRSA colonization surveillance program. It is not intended to diagnose MRSA infection nor to guide or monitor treatment for MRSA infections.     Time coordinating discharge: Over 54 minutes  Vance Gather, MD  [facility] 10/20/2016, 1:38 PM Pager [phone]

## 2016-10-21 ENCOUNTER — Telehealth: Payer: Self-pay | Admitting: Medical

## 2016-10-21 LAB — THYROID ANTIBODIES: Thyroperoxidase Ab SerPl-aCnc: 12 IU/mL (ref 0–34)

## 2016-10-21 NOTE — Telephone Encounter (Signed)
Left message for pt to call. Pt needs a hospital follow up appt.

## 2016-10-22 LAB — THYROID STIMULATING IMMUNOGLOBULIN: Thyroid Stimulating Immunoglob: 57 % (ref 0–139)

## 2016-10-28 ENCOUNTER — Telehealth: Payer: Self-pay | Admitting: Internal Medicine

## 2016-10-28 ENCOUNTER — Encounter: Payer: Self-pay | Admitting: Medical

## 2016-10-28 ENCOUNTER — Ambulatory Visit (INDEPENDENT_AMBULATORY_CARE_PROVIDER_SITE_OTHER): Payer: Medicare Other | Admitting: Medical

## 2016-10-28 ENCOUNTER — Telehealth: Payer: Self-pay | Admitting: Medical

## 2016-10-28 VITALS — BP 132/74 | HR 58 | Wt 269.9 lb

## 2016-10-28 DIAGNOSIS — R946 Abnormal results of thyroid function studies: Secondary | ICD-10-CM

## 2016-10-28 DIAGNOSIS — E118 Type 2 diabetes mellitus with unspecified complications: Secondary | ICD-10-CM | POA: Diagnosis not present

## 2016-10-28 DIAGNOSIS — I4891 Unspecified atrial fibrillation: Secondary | ICD-10-CM | POA: Diagnosis not present

## 2016-10-28 DIAGNOSIS — I5023 Acute on chronic systolic (congestive) heart failure: Secondary | ICD-10-CM | POA: Diagnosis not present

## 2016-10-28 DIAGNOSIS — I429 Cardiomyopathy, unspecified: Secondary | ICD-10-CM

## 2016-10-28 DIAGNOSIS — R197 Diarrhea, unspecified: Secondary | ICD-10-CM

## 2016-10-28 DIAGNOSIS — R7989 Other specified abnormal findings of blood chemistry: Secondary | ICD-10-CM

## 2016-10-28 LAB — CBC WITH DIFFERENTIAL/PLATELET
Basophils Absolute: 0 cells/uL (ref 0–200)
Basophils Relative: 0 %
EOS ABS: 66 {cells}/uL (ref 15–500)
Eosinophils Relative: 1 %
HCT: 35.4 % (ref 35.0–45.0)
Hemoglobin: 11.2 g/dL — ABNORMAL LOW (ref 11.7–15.5)
LYMPHS PCT: 38 %
Lymphs Abs: 2508 cells/uL (ref 850–3900)
MCH: 30.1 pg (ref 27.0–33.0)
MCHC: 31.6 g/dL — AB (ref 32.0–36.0)
MCV: 95.2 fL (ref 80.0–100.0)
MONO ABS: 528 {cells}/uL (ref 200–950)
MPV: 11.3 fL (ref 7.5–12.5)
Monocytes Relative: 8 %
NEUTROS PCT: 53 %
Neutro Abs: 3498 cells/uL (ref 1500–7800)
Platelets: 216 10*3/uL (ref 140–400)
RBC: 3.72 MIL/uL — AB (ref 3.80–5.10)
RDW: 14.1 % (ref 11.0–15.0)
WBC: 6.6 10*3/uL (ref 4.0–10.5)

## 2016-10-28 LAB — BASIC METABOLIC PANEL
BUN: 29 mg/dL — AB (ref 7–25)
CO2: 28 mmol/L (ref 20–31)
Calcium: 9.5 mg/dL (ref 8.6–10.4)
Chloride: 106 mmol/L (ref 98–110)
Creat: 1.09 mg/dL — ABNORMAL HIGH (ref 0.50–0.99)
GLUCOSE: 145 mg/dL — AB (ref 65–99)
POTASSIUM: 4.9 mmol/L (ref 3.5–5.3)
Sodium: 140 mmol/L (ref 135–146)

## 2016-10-28 NOTE — Telephone Encounter (Signed)
Ms. Lantagne is calling because she was advised to have an MRI done, so she would like to know if Dr.Klein would need to put in an order for one? Please call, thanks. *patient is aware that you may call 10-29-16.

## 2016-10-28 NOTE — Telephone Encounter (Signed)
Philemon Kingdom, MD Follow up on 11/20/2016.   Specialty:  Internal Medicine Why:  2:00pm Contact information: [address] [place] [address] [phone]  Please call and verify her endocrinology f/u is with Dr. Cruzita Lederer on 11/20/16 at 2pm.   Let patient know that she should continue the Methimazole/Tapazole 5mg  daily for overactive thyroid until she sees Dr. Cruzita Lederer.  (find out the correct pronunciation of [place] for me)  She needs to call Dr. Olin Pia office for f/u appt      [name], MD Follow up.   Specialty:  Cardiology Contact information: [id number] [place] [address] [phone]

## 2016-10-28 NOTE — Progress Notes (Signed)
Subjective: Chief Complaint  Patient presents with  . hospital follow up    hospital follow up for sob, chest pain  x3 days,    Here for hospital f/u.    Was admitted 10/17/16 - 10/20/16 for chronic  Systolic CHF, hypertension, dyslipidemia, diabetes, Afib, hypokalemia.  She went in to the hospital with dyspnea, diarrhea.    She notes chronic fatigue in general.   Was at the point that walking a few feet gave her SOB.   Since hospital still has some dyspnea ,but improved. Still getting swelling in legs, but has been working on some stuff, not getting much rest, not elevating legs, no weighing her self.    She notes during this hospitalization was diagnosed with thyroid disease.    Has been having about a month hx/o loose stools.  Stools have been yellow gold, bubbly, 2-3 loose stools daily.  No formed stool.  Son was sick some diarrhea illness few weeks ago.  Denies recent travel. No recent antibiotic use.   She says her abdomen feels swollen.   No fever.   No nausea or vomiting. .  Past Medical History:  Diagnosis Date  . Anemia   . Ankle fracture, right    x 2  . Antineoplastic and immunosuppressive drugs causing adverse effect in therapeutic use   . Asthma   . Atrial fibrillation ([place])   . Chronic venous hypertension without complications   . Colon polyps   . Colon polyps 2009  . Coronary artery disease    no CAD by cath 11.2010  . Depressive disorder, not elsewhere classified   . Diastolic CHF, chronic ([place])   . Diverticulosis of colon 2009   colonoscopy  . Dyslipidemia   . Glucose intolerance (impaired glucose tolerance)    hgb AIC 6.1 09/02/2009  . Gout   . Hepatitis, unspecified    hx of  . Hx of hysterectomy 2002  . Hyperlipidemia   . Hypertension   . Irritable bowel syndrome   . Lung nodule    8mm RLL nodule on CT Chest  07/2009, resolved by 2011 CT  . Noncompliance   . Obesity   . Persistent atrial fibrillation ([place])    s/p DCCV 07/2009; Pradaxa Rx  . PONV  (postoperative nausea and vomiting)   . Sarcoidosis ([place])    dx by eye exam  . Sleep disturbance 06/2013   sleep study, mild abnormality, no criteria for CPAP   Current Outpatient Prescriptions on File Prior to Visit  Medication Sig Dispense Refill  . allopurinol (ZYLOPRIM) 300 MG tablet TAKE 1 TABLET BY MOUTH EVERY DAY (Patient taking differently: TAKE 300 MG BY MOUTH DAILY AS NEEDED FOR GOUT) 90 tablet 2  . ALPRAZolam (XANAX) 1 MG tablet Take 1 tablet (1 mg total) by mouth at bedtime as needed for anxiety. 45 tablet 1  . carvedilol (COREG) 12.5 MG tablet Take 1 tablet (12.5 mg total) by mouth 2 (two) times daily with a meal. 60 tablet 0  . colchicine (COLCRYS) 0.6 MG tablet Take 1 tablet (0.6 mg total) by mouth daily as needed. Reported on 01/21/2016 (Patient taking differently: Take 0.6 mg by mouth daily as needed (GOUT). Reported on 01/21/2016) 45 tablet 1  . dabigatran (PRADAXA) 150 MG CAPS capsule Take 1 capsule (150 mg total) by mouth 2 (two) times daily. 60 capsule 11  . furosemide (LASIX) 40 MG tablet Take 1 tablet (40 mg total) by mouth daily. 30 tablet 0  . losartan (COZAAR) 25 MG  tablet Take 1 tablet (25 mg total) by mouth daily. 90 tablet 3  . metFORMIN (GLUCOPHAGE) 500 MG tablet Take 1 tablet (500 mg total) by mouth 2 (two) times daily with a meal. 1 tablet po BID for diabetes 180 tablet 1  . methimazole (TAPAZOLE) 5 MG tablet take 2 tabs (10mg ) in the morning and 1 tab (5mg ) at night 90 tablet 0  . potassium chloride SA (K-DUR,KLOR-CON) 20 MEQ tablet Take 1 tablet (20 mEq total) by mouth daily. 30 tablet 0  . rosuvastatin (CRESTOR) 20 MG tablet Take 1 tablet (20 mg total) by mouth daily. 90 tablet 3  . traZODone (DESYREL) 50 MG tablet TAKE 1/2 TO 1 TABLET BY MOUTH EVERY NIGHT AT BEDTIME AS NEEDED FOR SLEEP 90 tablet 3  . VENTOLIN HFA 108 (90 Base) MCG/ACT inhaler INHALE 2 PUFFS INTO THE LUNGS EVERY 6 HOURS AS NEEDED FOR WHEEZING OR SHORTNESS OF BREATH 18 g 3  . FLUoxetine (PROZAC) 20  MG tablet TAKE 1 TABLET(20 MG) BY MOUTH DAILY (Patient not taking: Reported on 10/28/2016) 90 tablet 0  . [DISCONTINUED] propranolol (INDERAL) 60 MG tablet Take 60 mg by mouth daily.       No current facility-administered medications on file prior to visit.    ROS as in subjective    Objective: BP 132/74   Pulse (!) 58   Wt 269 lb 14.4 oz (122.4 kg)   SpO2 96%   BMI 47.81 kg/m   Gen: wd, wn, nad, pleasant Lungs clear Heart irregular, but no distinct murmur Ext: 1+ bilat pitting edema 2+ pulses Abdomen: +bs, soft, mild generalized tenderness, no mass, no organomegaly Neck: supple, no JVD, no thyromegaly or lymphadenopathy     Assessment: Encounter Diagnoses  Name Primary?  . Acute on chronic systolic CHF (congestive heart failure) ([place]) Yes  . Atrial fibrillation, unspecified type ([place])   . Cardiomyopathy, secondary ([place])   . Type 2 diabetes mellitus with complication, without long-term current use of insulin ([place])   . Abnormal thyroid blood test   . Diarrhea, unspecified type     Plan: discussed her recent hospitalization.    Discussed importance of advising her cardiologists or [place] about early CHF symptoms, when to get checked out.  At this point she has chronic diarrhea that started about a month ago.   Will have her turn in stool studies for testing.  Discussed hand washing, hydration.  Drink clear fluids, avoid fried and greasy foods.  You can use 1 or 2 imodium daily if needed for loose stool, but if fever or bloody diarrhea, then don't use imodium.  CHF - She hasn't been checking weights.  Discussed importance of this.  Advised leg elevated, compression hose in general to help with edema.  C/t Lasix 40mg  daily.  Atrial fibrillation   Continue Carvedilol 12.5mg  twice daily  continue Pradaxa to reduce chance of clots  New diagnosis of overactive thyroid Follow up with endocrinology Continue Methimazole  recommendations given: Continue your current  medications We will verify your appointment with the endocrinologist/thyroid specialist Follow up with cardiology as planned Turn in the stool samples once you have collected 3 samples.  This will help [place] check for infection  Weigh yourself daily.   Call us , your cardiologist or get evaluated if you have the following:  If you have a 3 pound weight gain in 24 hours or 5 pound weight gain in a week  Shortness of breath   Dry hacking cough  Swelling of legs  worsening  If you have to sleep on extra pillows at night to breath  C/t rest of medications as usual  Delesa was seen today for hospital follow up.  Diagnoses and all orders for this visit:  Acute on chronic systolic CHF (congestive heart failure) ([place]) -     Basic metabolic panel -     CBC with Differential/Platelet  Atrial fibrillation, unspecified type (HCC)  Cardiomyopathy, secondary ([place])  Type 2 diabetes mellitus with complication, without long-term current use of insulin (HCC) -     Basic metabolic panel -     CBC with Differential/Platelet  Abnormal thyroid blood test -     Basic metabolic panel -     CBC with Differential/Platelet  Diarrhea, unspecified type -     Basic metabolic panel -     CBC with Differential/Platelet -     Clostridium difficile EIA; Future -     Stool culture; Future -     Ova and parasite examination; Future

## 2016-10-28 NOTE — Patient Instructions (Addendum)
Continue your current medications We will verify your appointment with the endocrinologist/thyroid specialist Follow up with cardiology as planned Turn in the stool samples once you have collected 3 samples.  This will help [place] check for infection  Weigh yourself daily.   Call us , your cardiologist or get evaluated if you have the following:  If you have a 3 pound weight gain in 24 hours or 5 pound weight gain in a week  Shortness of breath   Dry hacking cough  Swelling of legs worsening  If you have to sleep on extra pillows at night to breath  Drink clear fluids, avoid fried and greasy foods You can use 1 or 2 imodium daily if needed for loose stool, but if fever or bloody diarrhea, then don't use imodium.

## 2016-10-29 NOTE — Telephone Encounter (Signed)
Pt was notified , and mailed this out to patient .

## 2016-10-29 NOTE — Telephone Encounter (Signed)
I spoke with the patient- she states she was in ICU from 12/30-1/2 and that it was mentioned to her that she needs a MRI. I questioned if this was a cardiac MRI and she thought it was. She remembered Dr.  Harrington Challenger seeing her in the hospital. She currently has a follow up with Ermalinda Barrios, PA on 1/16, Dr. [name] on 1/25, and Dr. [name] on 2/26. I advised the patient that I will need to review with Dr. [name] and find out which appointments she needs to keep- I will call her back. She is agreeable.

## 2016-10-29 NOTE — Telephone Encounter (Signed)
I attempted to call the patient at her home #- message that this was "temporarily disconnected."  I left a message on her cell # to please call me back.

## 2016-10-30 NOTE — Telephone Encounter (Signed)
Per Dr. [name] after review of the chart and further discussion with Dr. Harrington Challenger, there is no identified reason for Cardiac MRI. Dr. [name] would like the patient to keep her appt with him on 11/02/16 and cancel the appt with Havery Moros for 1/16.  I have called and spoken with the patient to make her aware of Dr. Olin Pia recommendations. She states she is agreeable with this, however, she is having some SOB and having trouble laying down at night, which she was experiencing in the hallway. She is also having lower extremity swelling and feels as though she may be retaining some fluid in her abdominal area.  She thinks she may be urinating less.   She is currently on furosemide 40 mg once daily. BMP on 1/10 - creatinine- 1.09/ K+ 4.9 The patient does not weigh at home. I have advised her to obtain a scale and how to track her weights. I have also advised her to increase her lasix to 80 mg once daily x 3 days, then resume her normal dose of 40 mg once daily. She will call the office back on Monday if she is not feeling any better, otherwise she will follow up with Dr. [name] on 11/02/16.  She also reported diarrhea that was yellow in color in the hospital. She has seen Dorothea Ogle, PA on 1/10 and has instructed her to obtain a stool specimen at home to check for C-Diff- she states she will try to do this today.

## 2016-11-03 ENCOUNTER — Ambulatory Visit: Payer: Medicare Other | Admitting: Physician Assistant

## 2016-11-11 ENCOUNTER — Emergency Department (HOSPITAL_COMMUNITY): Payer: Medicare Other

## 2016-11-11 ENCOUNTER — Inpatient Hospital Stay (HOSPITAL_COMMUNITY)
Admission: EM | Admit: 2016-11-11 | Discharge: 2016-11-14 | DRG: 308 | Disposition: A | Discharge status: Home / Self Care | Payer: Medicare Other | Attending: Internal Medicine | Admitting: Internal Medicine

## 2016-11-11 ENCOUNTER — Encounter (HOSPITAL_COMMUNITY): Payer: Self-pay | Admitting: Emergency Medicine

## 2016-11-11 DIAGNOSIS — I10 Essential (primary) hypertension: Secondary | ICD-10-CM | POA: Diagnosis present

## 2016-11-11 DIAGNOSIS — Z9049 Acquired absence of other specified parts of digestive tract: Secondary | ICD-10-CM

## 2016-11-11 DIAGNOSIS — I4891 Unspecified atrial fibrillation: Secondary | ICD-10-CM | POA: Diagnosis present

## 2016-11-11 DIAGNOSIS — I5043 Acute on chronic combined systolic (congestive) and diastolic (congestive) heart failure: Secondary | ICD-10-CM | POA: Diagnosis present

## 2016-11-11 DIAGNOSIS — Z6841 Body Mass Index (BMI) 40.0 and over, adult: Secondary | ICD-10-CM

## 2016-11-11 DIAGNOSIS — I429 Cardiomyopathy, unspecified: Secondary | ICD-10-CM | POA: Diagnosis not present

## 2016-11-11 DIAGNOSIS — Z90710 Acquired absence of both cervix and uterus: Secondary | ICD-10-CM

## 2016-11-11 DIAGNOSIS — E669 Obesity, unspecified: Secondary | ICD-10-CM | POA: Diagnosis present

## 2016-11-11 DIAGNOSIS — R197 Diarrhea, unspecified: Secondary | ICD-10-CM | POA: Diagnosis not present

## 2016-11-11 DIAGNOSIS — IMO0001 Reserved for inherently not codable concepts without codable children: Secondary | ICD-10-CM | POA: Diagnosis present

## 2016-11-11 DIAGNOSIS — Z820 Family history of epilepsy and other diseases of the nervous system: Secondary | ICD-10-CM

## 2016-11-11 DIAGNOSIS — E119 Type 2 diabetes mellitus without complications: Secondary | ICD-10-CM | POA: Diagnosis present

## 2016-11-11 DIAGNOSIS — D869 Sarcoidosis, unspecified: Secondary | ICD-10-CM | POA: Diagnosis present

## 2016-11-11 DIAGNOSIS — I110 Hypertensive heart disease with heart failure: Secondary | ICD-10-CM | POA: Diagnosis present

## 2016-11-11 DIAGNOSIS — I482 Chronic atrial fibrillation: Principal | ICD-10-CM | POA: Diagnosis present

## 2016-11-11 DIAGNOSIS — R000 Tachycardia, unspecified: Secondary | ICD-10-CM | POA: Diagnosis not present

## 2016-11-11 DIAGNOSIS — I2489 Other forms of acute ischemic heart disease: Secondary | ICD-10-CM | POA: Diagnosis present

## 2016-11-11 DIAGNOSIS — Z86010 Personal history of colonic polyps: Secondary | ICD-10-CM

## 2016-11-11 DIAGNOSIS — R0602 Shortness of breath: Secondary | ICD-10-CM | POA: Diagnosis not present

## 2016-11-11 DIAGNOSIS — E785 Hyperlipidemia, unspecified: Secondary | ICD-10-CM | POA: Diagnosis present

## 2016-11-11 DIAGNOSIS — Z862 Personal history of diseases of the blood and blood-forming organs and certain disorders involving the immune mechanism: Secondary | ICD-10-CM

## 2016-11-11 DIAGNOSIS — E118 Type 2 diabetes mellitus with unspecified complications: Secondary | ICD-10-CM | POA: Diagnosis present

## 2016-11-11 DIAGNOSIS — Z8249 Family history of ischemic heart disease and other diseases of the circulatory system: Secondary | ICD-10-CM

## 2016-11-11 DIAGNOSIS — F329 Major depressive disorder, single episode, unspecified: Secondary | ICD-10-CM | POA: Diagnosis present

## 2016-11-11 DIAGNOSIS — K5289 Other specified noninfective gastroenteritis and colitis: Secondary | ICD-10-CM | POA: Diagnosis present

## 2016-11-11 DIAGNOSIS — Z794 Long term (current) use of insulin: Secondary | ICD-10-CM | POA: Diagnosis present

## 2016-11-11 DIAGNOSIS — M109 Gout, unspecified: Secondary | ICD-10-CM | POA: Diagnosis present

## 2016-11-11 DIAGNOSIS — E0590 Thyrotoxicosis, unspecified without thyrotoxic crisis or storm: Secondary | ICD-10-CM | POA: Diagnosis present

## 2016-11-11 DIAGNOSIS — I248 Other forms of acute ischemic heart disease: Secondary | ICD-10-CM | POA: Diagnosis present

## 2016-11-11 DIAGNOSIS — I5021 Acute systolic (congestive) heart failure: Secondary | ICD-10-CM | POA: Diagnosis present

## 2016-11-11 DIAGNOSIS — Z886 Allergy status to analgesic agent status: Secondary | ICD-10-CM

## 2016-11-11 DIAGNOSIS — Z888 Allergy status to other drugs, medicaments and biological substances status: Secondary | ICD-10-CM

## 2016-11-11 DIAGNOSIS — Z79899 Other long term (current) drug therapy: Secondary | ICD-10-CM

## 2016-11-11 DIAGNOSIS — R079 Chest pain, unspecified: Secondary | ICD-10-CM | POA: Diagnosis not present

## 2016-11-11 DIAGNOSIS — Z7901 Long term (current) use of anticoagulants: Secondary | ICD-10-CM

## 2016-11-11 DIAGNOSIS — F411 Generalized anxiety disorder: Secondary | ICD-10-CM | POA: Diagnosis present

## 2016-11-11 DIAGNOSIS — K529 Noninfective gastroenteritis and colitis, unspecified: Secondary | ICD-10-CM | POA: Diagnosis present

## 2016-11-11 DIAGNOSIS — R05 Cough: Secondary | ICD-10-CM | POA: Diagnosis not present

## 2016-11-11 DIAGNOSIS — Z7984 Long term (current) use of oral hypoglycemic drugs: Secondary | ICD-10-CM

## 2016-11-11 LAB — COMPREHENSIVE METABOLIC PANEL
ALT: 19 U/L (ref 14–54)
AST: 23 U/L (ref 15–41)
Albumin: 3.7 g/dL (ref 3.5–5.0)
Alkaline Phosphatase: 65 U/L (ref 38–126)
Anion gap: 8 (ref 5–15)
BUN: 17 mg/dL (ref 6–20)
CHLORIDE: 106 mmol/L (ref 101–111)
CO2: 24 mmol/L (ref 22–32)
CREATININE: 0.99 mg/dL (ref 0.44–1.00)
Calcium: 9.4 mg/dL (ref 8.9–10.3)
GFR calc Af Amer: >60 mL/min (ref >60)
GFR calc non Af Amer: 59 mL/min — ABNORMAL LOW (ref >60)
Glucose, Bld: 206 mg/dL — ABNORMAL HIGH (ref 65–99)
Potassium: 3.8 mmol/L (ref 3.5–5.1)
SODIUM: 138 mmol/L (ref 135–145)
Total Bilirubin: 0.7 mg/dL (ref 0.3–1.2)
Total Protein: 7.5 g/dL (ref 6.5–8.1)

## 2016-11-11 LAB — CBC WITH DIFFERENTIAL/PLATELET
Basophils Absolute: 0 10*3/uL (ref 0.0–0.1)
Basophils Relative: 0 %
EOS ABS: 0 10*3/uL (ref 0.0–0.7)
EOS PCT: 0 %
HCT: 35.4 % — ABNORMAL LOW (ref 36.0–46.0)
HEMOGLOBIN: 11.7 g/dL — AB (ref 12.0–15.0)
LYMPHS ABS: 3.1 10*3/uL (ref 0.7–4.0)
Lymphocytes Relative: 37 %
MCH: 29.7 pg (ref 26.0–34.0)
MCHC: 33.1 g/dL (ref 30.0–36.0)
MCV: 89.8 fL (ref 78.0–100.0)
MONOS PCT: 7 %
Monocytes Absolute: 0.5 10*3/uL (ref 0.1–1.0)
NEUTROS PCT: 56 %
Neutro Abs: 4.6 10*3/uL (ref 1.7–7.7)
Platelets: 204 10*3/uL (ref 150–400)
RBC: 3.94 MIL/uL (ref 3.87–5.11)
RDW: 14.3 % (ref 11.5–15.5)
WBC: 8.2 10*3/uL (ref 4.0–10.5)

## 2016-11-11 LAB — TROPONIN I: Troponin I: 0.05 ng/mL (ref <0.03)

## 2016-11-11 LAB — BRAIN NATRIURETIC PEPTIDE: B NATRIURETIC PEPTIDE 5: 1473.6 pg/mL — AB (ref 0.0–100.0)

## 2016-11-11 MED ORDER — DILTIAZEM LOAD VIA INFUSION
10.0000 mg | Freq: Once | INTRAVENOUS | Status: AC
  Administered 2016-11-12: 10 mg via INTRAVENOUS
  Filled 2016-11-11: qty 10

## 2016-11-11 MED ORDER — SODIUM CHLORIDE 0.9 % IV BOLUS (SEPSIS)
500.0000 mL | Freq: Once | INTRAVENOUS | Status: AC
  Administered 2016-11-11: 500 mL via INTRAVENOUS

## 2016-11-11 MED ORDER — DILTIAZEM HCL-DEXTROSE 100-5 MG/100ML-% IV SOLN (PREMIX)
5.0000 mg/h | INTRAVENOUS | Status: DC
Start: 2016-11-11 — End: 2016-11-12
  Administered 2016-11-12: 12.5 mg/h via INTRAVENOUS
  Administered 2016-11-12: 5 mg/h via INTRAVENOUS
  Administered 2016-11-12: 12.5 mg/h via INTRAVENOUS
  Filled 2016-11-11 (×2): qty 100

## 2016-11-11 NOTE — ED Provider Notes (Signed)
[facility] DEPT Provider Note   CSN: [id number] Arrival date & time: 11/11/16  2130     History   Chief Complaint Chief Complaint  Patient presents with  . Shortness of Breath  . Abdominal Pain    HPI Stephanie [name] is a 66 y.o. female.  HPI  patient presents with shortness of breath. Has had diarrhea and states she feels bad. The diarrhea has been going for about a month. Short of breath has been worse the last few days. States her heart is been racing for about a week. History of A. Fib and is on per DEXA but I am not sure has been taking it. States being sick she wasn't sure if she should take some of the medicines. No fevers. She has had diarrhea but has not had her stool analyzed it. Has not been on antibiotics. Previously seen for A. Fib with RVR. Also is hyperthyroidism at that time. She has not followed with a endocrinologist yet.   Past Medical History:  Diagnosis Date  . Anemia   . Ankle fracture, right    x 2  . Antineoplastic and immunosuppressive drugs causing adverse effect in therapeutic use   . Asthma   . Atrial fibrillation ([place])   . Chronic venous hypertension without complications   . Colon polyps   . Colon polyps 2009  . Coronary artery disease    no CAD by cath 11.2010  . Depressive disorder, not elsewhere classified   . Diastolic CHF, chronic ([place])   . Diverticulosis of colon 2009   colonoscopy  . Dyslipidemia   . Glucose intolerance (impaired glucose tolerance)    hgb AIC 6.1 09/02/2009  . Gout   . Hepatitis, unspecified    hx of  . Hx of hysterectomy 2002  . Hyperlipidemia   . Hypertension   . Irritable bowel syndrome   . Lung nodule    1mm RLL nodule on CT Chest  07/2009, resolved by 2011 CT  . Noncompliance   . Obesity   . Persistent atrial fibrillation ([place])    s/p DCCV 07/2009; Pradaxa Rx  . PONV (postoperative nausea and vomiting)   . Sarcoidosis ([place])    dx by eye exam  . Sleep disturbance 06/2013   sleep study, mild  abnormality, no criteria for CPAP    Patient Active Problem List   Diagnosis Date Noted  . Acute systolic CHF (congestive heart failure) ([place])   . Atrial fibrillation with RVR ([place]) 10/18/2016  . Acute on chronic systolic CHF (congestive heart failure) ([place]) 10/18/2016  . Hypokalemia 10/18/2016  . Need for prophylactic vaccination against Streptococcus pneumoniae (pneumococcus) 08/20/2016  . Type 2 diabetes mellitus with complication, without long-term current use of insulin ([place]) 05/12/2016  . Edema 05/12/2016  . B12 deficiency 05/06/2016  . Normocytic anemia 04/15/2016  . Insomnia 01/21/2016  . Pelvic pain in female 08/06/2015  . Screening for breast cancer 08/06/2015  . History of anemia 08/02/2015  . Other specified nutritional anemias 08/02/2015  . Urinary pain 08/02/2015  . Need for prophylactic vaccination and inoculation against influenza 08/02/2015  . Obesity 08/02/2015  . Dyslipidemia 08/02/2015  . History of sarcoidosis 08/02/2015  . Generalized anxiety disorder 08/02/2015  . Other secondary chronic gout 08/02/2015  . Abnormal thyroid blood test 08/02/2015  . Physical exam 08/02/2015  . Needs flu shot 08/02/2015  . Dysthymic disorder 08/02/2015  . Ataxia 08/22/2014  . Cardiomyopathy, secondary ([place]) 01/28/2011  . CHRONIC DIASTOLIC HEART FAILURE [id number]  . Essential hypertension  09/02/2009  . Atrial fibrillation ([place]) 08/20/2009  . Lung nodule 08/20/2009  . CHRONIC VENOUS HYPERTENSION WITHOUT COMPS 11/21/2007    Past Surgical History:  Procedure Laterality Date  . ABDOMINAL HYSTERECTOMY    . CARDIOVERSION     x 2  . CARDIOVERSION N/A 02/27/2013   Procedure: CARDIOVERSION;  Surgeon: Lelon Perla, MD;  Location: [facility] ENDOSCOPY;  Service: Cardiovascular;  Laterality: N/A;  . CHOLECYSTECTOMY    . COLONOSCOPY     Diverticulosis, colon polyps, repeat 2014, Dr. Deatra Ina  . ESOPHAGOGASTRODUODENOSCOPY N/A 05/16/2014   Procedure: ESOPHAGOGASTRODUODENOSCOPY (EGD);   Surgeon: Wonda Horner, MD;  Location: [facility] ORS;  Service: Gastroenterology;  Laterality: N/A;  . HERNIA REPAIR  2009    OB History    No data available       Home Medications    Prior to Admission medications   Medication Sig Start Date End Date Taking? Authorizing Provider  allopurinol (ZYLOPRIM) 300 MG tablet TAKE 1 TABLET BY MOUTH EVERY DAY Patient taking differently: TAKE 300 MG BY MOUTH DAILY AS NEEDED FOR GOUT 11/18/15  Yes Camelia Eng Tysinger, PA-C  ALPRAZolam Duanne Moron) 1 MG tablet Take 1 tablet (1 mg total) by mouth at bedtime as needed for anxiety. 08/20/16  Yes Camelia Eng Tysinger, PA-C  carvedilol (COREG) 12.5 MG tablet Take 1 tablet (12.5 mg total) by mouth 2 (two) times daily with a meal. 10/20/16  Yes Patrecia Pour, MD  colchicine (COLCRYS) 0.6 MG tablet Take 1 tablet (0.6 mg total) by mouth daily as needed. Reported on 01/21/2016 Patient taking differently: Take 0.6 mg by mouth daily as needed (GOUT). Reported on 01/21/2016 01/21/16  Yes Camelia Eng Tysinger, PA-C  dabigatran (PRADAXA) 150 MG CAPS capsule Take 1 capsule (150 mg total) by mouth 2 (two) times daily. 12/16/15  Yes Thompson Grayer, MD  FLUoxetine (PROZAC) 20 MG tablet TAKE 1 TABLET(20 MG) BY MOUTH DAILY 10/15/16  Yes Camelia Eng Tysinger, PA-C  furosemide (LASIX) 40 MG tablet Take 1 tablet (40 mg total) by mouth daily. 10/20/16  Yes Patrecia Pour, MD  levothyroxine (SYNTHROID, LEVOTHROID) 50 MCG tablet Take 50 mcg by mouth daily before breakfast.   Yes Historical Provider, MD  losartan (COZAAR) 25 MG tablet Take 1 tablet (25 mg total) by mouth daily. 06/18/16 11/11/16 Yes Deboraha Sprang, MD  metFORMIN (GLUCOPHAGE) 500 MG tablet Take 1 tablet (500 mg total) by mouth 2 (two) times daily with a meal. 1 tablet po BID for diabetes 05/13/16  Yes Camelia Eng Tysinger, PA-C  methimazole (TAPAZOLE) 5 MG tablet take 2 tabs (10mg ) in the morning and 1 tab (5mg ) at night 10/20/16  Yes Patrecia Pour, MD  potassium chloride SA (K-DUR,KLOR-CON) 20 MEQ tablet Take 1  tablet (20 mEq total) by mouth daily. 10/20/16  Yes Patrecia Pour, MD  rosuvastatin (CRESTOR) 20 MG tablet Take 1 tablet (20 mg total) by mouth daily. 08/21/16  Yes Camelia Eng Tysinger, PA-C  traZODone (DESYREL) 50 MG tablet TAKE 1/2 TO 1 TABLET BY MOUTH EVERY NIGHT AT BEDTIME AS NEEDED FOR SLEEP 05/13/16  Yes Camelia Eng Tysinger, PA-C  VENTOLIN HFA 108 (90 Base) MCG/ACT inhaler INHALE 2 PUFFS INTO THE LUNGS EVERY 6 HOURS AS NEEDED FOR WHEEZING OR SHORTNESS OF BREATH 09/22/16  Yes Carlena Hurl, PA-C    Family History Family History  Problem Relation Age of Onset  . Pneumonia Father     deceased  . Hypertension Father   . Cancer Father   .  Multiple sclerosis Mother     hx  . Emphysema Other     runs in the family    Social History Social History  Substance Use Topics  . Smoking status: Never Smoker  . Smokeless tobacco: Never Used  . Alcohol use 0.0 oz/week     Comment: drinks 1 glass of wine every 2 weeks on average (occasional)     Allergies   Albuterol; Amiodarone; Atorvastatin; Livalo [pitavastatin]; and Propoxyphene n-acetaminophen   Review of Systems Review of Systems  Constitutional: Positive for appetite change. Negative for fever.  HENT: Negative for congestion.   Eyes: Negative for photophobia.  Respiratory: Positive for shortness of breath.   Cardiovascular: Positive for chest pain.  Gastrointestinal: Positive for diarrhea. Negative for abdominal distention and nausea.  Genitourinary: Negative for flank pain.  Musculoskeletal: Negative for back pain.  Skin: Negative for wound.  Neurological: Positive for weakness.  Hematological: Negative for adenopathy.  Psychiatric/Behavioral: Negative for confusion.     Physical Exam Updated Vital Signs BP (!) 193/149   Pulse 73   Temp 98.7 F (37.1 C) (Oral)   Resp (!) 29   SpO2 100%   Physical Exam  Constitutional: She appears well-developed.  HENT:  Head: Normocephalic.  Eyes: Pupils are equal, round, and  reactive to light.  Neck: Neck supple. No JVD present.  Cardiovascular:  Tachycardia  Pulmonary/Chest: Effort normal. She has no rales.  Abdominal: Soft. There is no tenderness.  Musculoskeletal: She exhibits edema.  Mild bilateral LE pitting edema  Neurological: She is alert.  Skin: Skin is warm. Capillary refill takes less than 2 seconds.     ED Treatments / Results  Labs (all labs ordered are listed, but only abnormal results are displayed) Labs Reviewed  TROPONIN I - Abnormal; Notable for the following:       Result Value   Troponin I 0.05 (*)    All other components within normal limits  BRAIN NATRIURETIC PEPTIDE - Abnormal; Notable for the following:    B Natriuretic Peptide 1,473.6 (*)    All other components within normal limits  COMPREHENSIVE METABOLIC PANEL - Abnormal; Notable for the following:    Glucose, Bld 206 (*)    GFR calc non Af Amer 59 (*)    All other components within normal limits  CBC WITH DIFFERENTIAL/PLATELET - Abnormal; Notable for the following:    Hemoglobin 11.7 (*)    HCT 35.4 (*)    All other components within normal limits  C DIFFICILE QUICK SCREEN W PCR REFLEX  GASTROINTESTINAL PANEL BY PCR, STOOL (REPLACES STOOL CULTURE)  URINALYSIS, ROUTINE W REFLEX MICROSCOPIC    EKG  EKG Interpretation  Date/Time:  Wednesday November 11 2016 21:47:51 EST Ventricular Rate:  128 PR Interval:    QRS Duration: 96 QT Interval:  286 QTC Calculation: 418 R Axis:   -22 Text Interpretation:  Atrial fibrillation LVH with secondary repolarization abnormality Confirmed by Alvino Chapel  MD, Dariah Mcsorley [phone]) on 11/11/2016 10:08:11 PM       Radiology Dg Chest Portable 1 View  Result Date: 11/11/2016 CLINICAL DATA:  Acute onset of dry cough and shortness of breath. Mid chest pain. Initial encounter. EXAM: PORTABLE CHEST 1 VIEW COMPARISON:  Chest radiograph performed 10/17/2016 FINDINGS: The lungs are well-aerated CT. Vascular congestion is noted, with mild right  basilar scarring or atelectasis. There is no evidence of pleural effusion or pneumothorax. The cardiomediastinal silhouette is mildly enlarged. No acute osseous abnormalities are seen. IMPRESSION: Vascular congestion and mild cardiomegaly. Mild  right basilar atelectasis or scarring. Electronically Signed   By: Garald Balding M.D.   On: 11/11/2016 22:40    Procedures Procedures (including critical care time)  Medications Ordered in ED Medications  diltiazem (CARDIZEM) 1 mg/mL load via infusion 10 mg (10 mg Intravenous Bolus from Bag 11/12/16 0000)    And  diltiazem (CARDIZEM) 100 mg in dextrose 5% 144mL (1 mg/mL) infusion (5 mg/hr Intravenous New Bag/Given 11/12/16 0000)  sodium chloride 0.9 % bolus 500 mL (0 mLs Intravenous Stopped 11/12/16 0000)     Initial Impression / Assessment and Plan / ED Course  I have reviewed the triage vital signs and the nursing notes.  Pertinent labs & imaging results that were available during my care of the patient were reviewed by me and considered in my medical decision making (see chart for details).     Patient presents with shortness of breath. Found to be in A. Fib RVR.Artery on pradaxa. He has had diarrhea. Somewhat worrisome for dehydration but also has not been checking her weight at home.mild edema on her legs. BNP is elevated but does not sound wet. Heart rate came down a little with some fluids gently given. Will add Cardizem. Will admit to stepdown.  Chadsvasc of 3 for female,CHF and HTN  CRITICAL CARE Performed by: Mackie Pai. Total critical care time: 30 minutes Critical care time was exclusive of separately billable procedures and treating other patients. Critical care was necessary to treat or prevent imminent or life-threatening deterioration. Critical care was time spent personally by me on the following activities: development of treatment plan with patient and/or surrogate as well as nursing, discussions with consultants,  evaluation of patient's response to treatment, examination of patient, obtaining history from patient or surrogate, ordering and performing treatments and interventions, ordering and review of laboratory studies, ordering and review of radiographic studies, pulse oximetry and re-evaluation of patient's condition.   Final Clinical Impressions(s) / ED Diagnoses   Final diagnoses:  Atrial fibrillation with rapid ventricular response ([place])  Diarrhea, unspecified type    New Prescriptions New Prescriptions   No medications on file     Davonna Belling, MD 11/12/16 0001

## 2016-11-11 NOTE — ED Triage Notes (Signed)
Pt reports shortness of breath with GI symptoms for the past 2 weeks.  Hx of atrial fibrillation.  Irregular HR on exam.  Pt A&O x4. Reports diarrhea for the past 2 months. Dry cough on exam.

## 2016-11-12 ENCOUNTER — Encounter (HOSPITAL_COMMUNITY): Payer: Self-pay | Admitting: Internal Medicine

## 2016-11-12 ENCOUNTER — Ambulatory Visit: Payer: Medicare Other | Admitting: Internal Medicine

## 2016-11-12 DIAGNOSIS — Z862 Personal history of diseases of the blood and blood-forming organs and certain disorders involving the immune mechanism: Secondary | ICD-10-CM | POA: Diagnosis not present

## 2016-11-12 DIAGNOSIS — Z90710 Acquired absence of both cervix and uterus: Secondary | ICD-10-CM | POA: Diagnosis not present

## 2016-11-12 DIAGNOSIS — Z6841 Body Mass Index (BMI) 40.0 and over, adult: Secondary | ICD-10-CM | POA: Diagnosis not present

## 2016-11-12 DIAGNOSIS — Z820 Family history of epilepsy and other diseases of the nervous system: Secondary | ICD-10-CM | POA: Diagnosis not present

## 2016-11-12 DIAGNOSIS — M109 Gout, unspecified: Secondary | ICD-10-CM | POA: Diagnosis present

## 2016-11-12 DIAGNOSIS — I10 Essential (primary) hypertension: Secondary | ICD-10-CM | POA: Diagnosis not present

## 2016-11-12 DIAGNOSIS — Z8249 Family history of ischemic heart disease and other diseases of the circulatory system: Secondary | ICD-10-CM | POA: Diagnosis not present

## 2016-11-12 DIAGNOSIS — Z7984 Long term (current) use of oral hypoglycemic drugs: Secondary | ICD-10-CM | POA: Diagnosis not present

## 2016-11-12 DIAGNOSIS — I248 Other forms of acute ischemic heart disease: Secondary | ICD-10-CM

## 2016-11-12 DIAGNOSIS — Z9049 Acquired absence of other specified parts of digestive tract: Secondary | ICD-10-CM | POA: Diagnosis not present

## 2016-11-12 DIAGNOSIS — I110 Hypertensive heart disease with heart failure: Secondary | ICD-10-CM | POA: Diagnosis present

## 2016-11-12 DIAGNOSIS — I4891 Unspecified atrial fibrillation: Secondary | ICD-10-CM | POA: Diagnosis present

## 2016-11-12 DIAGNOSIS — I5021 Acute systolic (congestive) heart failure: Secondary | ICD-10-CM | POA: Diagnosis not present

## 2016-11-12 DIAGNOSIS — K529 Noninfective gastroenteritis and colitis, unspecified: Secondary | ICD-10-CM | POA: Diagnosis present

## 2016-11-12 DIAGNOSIS — Z79899 Other long term (current) drug therapy: Secondary | ICD-10-CM | POA: Diagnosis not present

## 2016-11-12 DIAGNOSIS — Z86010 Personal history of colonic polyps: Secondary | ICD-10-CM | POA: Diagnosis not present

## 2016-11-12 DIAGNOSIS — I429 Cardiomyopathy, unspecified: Secondary | ICD-10-CM | POA: Diagnosis not present

## 2016-11-12 DIAGNOSIS — F329 Major depressive disorder, single episode, unspecified: Secondary | ICD-10-CM | POA: Diagnosis present

## 2016-11-12 DIAGNOSIS — E118 Type 2 diabetes mellitus with unspecified complications: Secondary | ICD-10-CM | POA: Diagnosis not present

## 2016-11-12 DIAGNOSIS — E0590 Thyrotoxicosis, unspecified without thyrotoxic crisis or storm: Secondary | ICD-10-CM | POA: Diagnosis present

## 2016-11-12 DIAGNOSIS — E669 Obesity, unspecified: Secondary | ICD-10-CM | POA: Diagnosis present

## 2016-11-12 DIAGNOSIS — I5023 Acute on chronic systolic (congestive) heart failure: Secondary | ICD-10-CM | POA: Diagnosis not present

## 2016-11-12 DIAGNOSIS — K5289 Other specified noninfective gastroenteritis and colitis: Secondary | ICD-10-CM | POA: Diagnosis present

## 2016-11-12 DIAGNOSIS — Z7901 Long term (current) use of anticoagulants: Secondary | ICD-10-CM | POA: Diagnosis not present

## 2016-11-12 DIAGNOSIS — I5043 Acute on chronic combined systolic (congestive) and diastolic (congestive) heart failure: Secondary | ICD-10-CM | POA: Diagnosis present

## 2016-11-12 DIAGNOSIS — F411 Generalized anxiety disorder: Secondary | ICD-10-CM | POA: Diagnosis present

## 2016-11-12 DIAGNOSIS — D869 Sarcoidosis, unspecified: Secondary | ICD-10-CM | POA: Diagnosis present

## 2016-11-12 DIAGNOSIS — E785 Hyperlipidemia, unspecified: Secondary | ICD-10-CM | POA: Diagnosis not present

## 2016-11-12 DIAGNOSIS — R000 Tachycardia, unspecified: Secondary | ICD-10-CM | POA: Diagnosis present

## 2016-11-12 DIAGNOSIS — E6609 Other obesity due to excess calories: Secondary | ICD-10-CM | POA: Diagnosis not present

## 2016-11-12 DIAGNOSIS — I482 Chronic atrial fibrillation: Secondary | ICD-10-CM | POA: Diagnosis present

## 2016-11-12 LAB — URINALYSIS, ROUTINE W REFLEX MICROSCOPIC
BILIRUBIN URINE: NEGATIVE
GLUCOSE, UA: NEGATIVE mg/dL
HGB URINE DIPSTICK: NEGATIVE
KETONES UR: NEGATIVE mg/dL
LEUKOCYTES UA: NEGATIVE
Nitrite: NEGATIVE
PH: 5 (ref 5.0–8.0)
PROTEIN: NEGATIVE mg/dL
Specific Gravity, Urine: 1.008 (ref 1.005–1.030)

## 2016-11-12 LAB — GASTROINTESTINAL PANEL BY PCR, STOOL (REPLACES STOOL CULTURE)

## 2016-11-12 LAB — T4, FREE: Free T4: 1.75 ng/dL — ABNORMAL HIGH (ref 0.61–1.12)

## 2016-11-12 LAB — TROPONIN I
Troponin I: 0.03 ng/mL (ref <0.03)
Troponin I: 0.03 ng/mL (ref <0.03)

## 2016-11-12 LAB — GLUCOSE, CAPILLARY
GLUCOSE-CAPILLARY: 158 mg/dL — AB (ref 65–99)
Glucose-Capillary: 151 mg/dL — ABNORMAL HIGH (ref 65–99)

## 2016-11-12 LAB — CBG MONITORING, ED
GLUCOSE-CAPILLARY: 124 mg/dL — AB (ref 65–99)
Glucose-Capillary: 145 mg/dL — ABNORMAL HIGH (ref 65–99)
Glucose-Capillary: 196 mg/dL — ABNORMAL HIGH (ref 65–99)

## 2016-11-12 LAB — C DIFFICILE QUICK SCREEN W PCR REFLEX
C DIFFICILE (CDIFF) TOXIN: NEGATIVE
C DIFFICLE (CDIFF) ANTIGEN: NEGATIVE
C Diff interpretation: NOT DETECTED

## 2016-11-12 LAB — TSH

## 2016-11-12 MED ORDER — LOSARTAN POTASSIUM 50 MG PO TABS
25.0000 mg | ORAL_TABLET | Freq: Every day | ORAL | Status: DC
  Administered 2016-11-12 – 2016-11-14 (×3): 25 mg via ORAL
  Filled 2016-11-12 (×4): qty 1

## 2016-11-12 MED ORDER — ONDANSETRON HCL 4 MG/2ML IJ SOLN
4.0000 mg | Freq: Three times a day (TID) | INTRAMUSCULAR | Status: DC | PRN
  Administered 2016-11-12: 4 mg via INTRAVENOUS
  Filled 2016-11-12: qty 2

## 2016-11-12 MED ORDER — ALPRAZOLAM 1 MG PO TABS
1.0000 mg | ORAL_TABLET | Freq: Every evening | ORAL | Status: DC | PRN
  Administered 2016-11-13: 1 mg via ORAL
  Filled 2016-11-12 (×2): qty 1

## 2016-11-12 MED ORDER — ALPRAZOLAM 0.25 MG PO TABS
0.2500 mg | ORAL_TABLET | Freq: Two times a day (BID) | ORAL | Status: DC | PRN
  Administered 2016-11-13 (×2): 0.25 mg via ORAL
  Filled 2016-11-12 (×2): qty 1

## 2016-11-12 MED ORDER — DABIGATRAN ETEXILATE MESYLATE 150 MG PO CAPS
150.0000 mg | ORAL_CAPSULE | Freq: Two times a day (BID) | ORAL | Status: DC
  Administered 2016-11-12 – 2016-11-14 (×5): 150 mg via ORAL
  Filled 2016-11-12 (×7): qty 1

## 2016-11-12 MED ORDER — INSULIN ASPART 100 UNIT/ML ~~LOC~~ SOLN
0.0000 [IU] | Freq: Every day | SUBCUTANEOUS | Status: DC
  Administered 2016-11-12: 2 [IU] via SUBCUTANEOUS

## 2016-11-12 MED ORDER — METHIMAZOLE 10 MG PO TABS
10.0000 mg | ORAL_TABLET | Freq: Every day | ORAL | Status: DC
  Administered 2016-11-13 – 2016-11-14 (×2): 10 mg via ORAL
  Filled 2016-11-12 (×2): qty 1

## 2016-11-12 MED ORDER — ACETAMINOPHEN 325 MG PO TABS: 650.0000 mg | ORAL_TABLET | ORAL | Status: DC | PRN

## 2016-11-12 MED ORDER — DILTIAZEM HCL-DEXTROSE 100-5 MG/100ML-% IV SOLN (PREMIX)
5.0000 mg/h | INTRAVENOUS | Status: DC
  Administered 2016-11-12: 12.5 mg/h via INTRAVENOUS

## 2016-11-12 MED ORDER — DABIGATRAN ETEXILATE MESYLATE 150 MG PO CAPS: 150.0000 mg | ORAL_CAPSULE | Freq: Two times a day (BID) | ORAL | Status: DC

## 2016-11-12 MED ORDER — POTASSIUM CHLORIDE CRYS ER 20 MEQ PO TBCR
20.0000 meq | EXTENDED_RELEASE_TABLET | Freq: Every day | ORAL | Status: DC
  Administered 2016-11-12 – 2016-11-14 (×3): 20 meq via ORAL
  Filled 2016-11-12 (×3): qty 1

## 2016-11-12 MED ORDER — TRAZODONE HCL 50 MG PO TABS
25.0000 mg | ORAL_TABLET | Freq: Every evening | ORAL | Status: DC | PRN
  Administered 2016-11-13: 50 mg via ORAL
  Filled 2016-11-12: qty 1

## 2016-11-12 MED ORDER — ALPRAZOLAM ER 1 MG PO TB24
1.0000 mg | ORAL_TABLET | Freq: Every evening | ORAL | Status: DC | PRN
  Administered 2016-11-12: 1 mg via ORAL

## 2016-11-12 MED ORDER — METHIMAZOLE 5 MG PO TABS
5.0000 mg | ORAL_TABLET | Freq: Every day | ORAL | Status: DC
  Administered 2016-11-12 – 2016-11-13 (×2): 5 mg via ORAL
  Filled 2016-11-12 (×3): qty 1

## 2016-11-12 MED ORDER — FUROSEMIDE 10 MG/ML IJ SOLN
40.0000 mg | Freq: Once | INTRAMUSCULAR | Status: AC
  Administered 2016-11-12: 40 mg via INTRAVENOUS
  Filled 2016-11-12: qty 4

## 2016-11-12 MED ORDER — ROSUVASTATIN CALCIUM 20 MG PO TABS
20.0000 mg | ORAL_TABLET | Freq: Every day | ORAL | Status: DC
  Administered 2016-11-12 – 2016-11-14 (×3): 20 mg via ORAL
  Filled 2016-11-12 (×3): qty 1

## 2016-11-12 MED ORDER — CARVEDILOL 12.5 MG PO TABS
12.5000 mg | ORAL_TABLET | Freq: Two times a day (BID) | ORAL | Status: DC
  Administered 2016-11-12 – 2016-11-14 (×4): 12.5 mg via ORAL
  Filled 2016-11-12 (×4): qty 1

## 2016-11-12 MED ORDER — FUROSEMIDE 40 MG PO TABS
40.0000 mg | ORAL_TABLET | Freq: Every day | ORAL | Status: DC
  Administered 2016-11-13 – 2016-11-14 (×2): 40 mg via ORAL
  Filled 2016-11-12 (×2): qty 1

## 2016-11-12 MED ORDER — HYDRALAZINE HCL 20 MG/ML IJ SOLN: 5.0000 mg | INTRAMUSCULAR | Status: DC | PRN

## 2016-11-12 MED ORDER — INSULIN ASPART 100 UNIT/ML ~~LOC~~ SOLN
0.0000 [IU] | Freq: Three times a day (TID) | SUBCUTANEOUS | Status: DC
  Administered 2016-11-12 (×2): 2 [IU] via SUBCUTANEOUS
  Administered 2016-11-12: 1 [IU] via SUBCUTANEOUS
  Administered 2016-11-13 (×2): 2 [IU] via SUBCUTANEOUS
  Administered 2016-11-13 – 2016-11-14 (×2): 1 [IU] via SUBCUTANEOUS
  Filled 2016-11-12 (×2): qty 1

## 2016-11-12 NOTE — H&P (Signed)
History and Physical:    Stephanie [name]   [id number] DOB: 22-Oct-1950 DOA: 11/11/2016  Referring MD/provider: Davonna Belling, MD PCP: [name], PA-C   Patient coming from: Home  Chief Complaint: Diarrhea x 2 months, shortness of breath, chest tightness  History of Present Illness:   Stephanie [name] is an 66 y.o. female with a PMH of chronic atrial fibrillation on Pradaxa, morbid obesity, sarcoidosis, coronary artery disease, chronic diastolic CHF, hypertension, morbid obesity and recent diagnosis of hyperthyroidism (interestingly, both Synthroid and Tapazole are listed as current medications), who presented to the ED today with a chief complaint of a 2-3 day history of shortness of breath associated with the sensation of a racing heartbeat in the setting of a two-month history of chronic diarrhea. Patient thinks that the diarrhea may have been associated with a medication change where she was taken off Klonopin and put on Prozac. She describes the stools as soupy and yellow in color, 3-4 episodes per day. The diarrhea is associated with intestinal cramping that is not relieved after having a bowel movement, and appears to be triggered by oral intake. She has not tried any antidiarrheal medications, but has been using Advil for cramps. There is been no history of recent antibiotic usage. No sick contacts. The patient also reports that she has not taken any of her routine medications for the past 3 days because she felt "so bad ". She describes feeling tired, worn out, and lacking energy as a result of this chronic diarrhea.  ED Course:  The patient was found to be in atrial fibrillation with RVR (heart rate 128 bpm on EKG), and was placed on a Cardizem drip. Labs were significant for a troponin elevation of 0.05, 0.03 on recheck. BNP was elevated at 1473.6. Chest x-ray showed vascular congestion and mild cardiomegaly with right basilar atelectasis versus scarring.  ROS:    Review of Systems  Constitutional: Positive for chills, fever and malaise/fatigue. Negative for weight loss.  HENT: Positive for sore throat.   Eyes: Positive for blurred vision.  Respiratory: Positive for cough and shortness of breath. Negative for sputum production.   Cardiovascular: Positive for palpitations. Negative for chest pain and leg swelling.  Gastrointestinal: Positive for abdominal pain, diarrhea, heartburn and nausea. Negative for blood in stool, constipation, melena and vomiting.  Genitourinary: Negative.   Musculoskeletal: Positive for joint pain.  Skin: Negative.   Neurological: Positive for dizziness and weakness.  Endo/Heme/Allergies: Negative.   Psychiatric/Behavioral: Negative.     Past Medical History:   Past Medical History:  Diagnosis Date  . Anemia   . Ankle fracture, right    x 2  . Antineoplastic and immunosuppressive drugs causing adverse effect in therapeutic use   . Asthma   . Atrial fibrillation ([place])   . Chronic venous hypertension without complications   . Colon polyps   . Colon polyps 2009  . Coronary artery disease    no CAD by cath 11.2010  . Depressive disorder, not elsewhere classified   . Diastolic CHF, chronic ([place])   . Diverticulosis of colon 2009   colonoscopy  . Dyslipidemia   . Glucose intolerance (impaired glucose tolerance)    hgb AIC 6.1 09/02/2009  . Gout   . Hepatitis, unspecified    hx of  . Hx of hysterectomy 2002  . Hyperlipidemia   . Hypertension   . Irritable bowel syndrome   . Lung nodule    92mm RLL nodule on CT Chest  07/2009, resolved  by 2011 CT  . Noncompliance   . Obesity   . Persistent atrial fibrillation ([place])    s/p DCCV 07/2009; Pradaxa Rx  . PONV (postoperative nausea and vomiting)   . Sarcoidosis ([place])    dx by eye exam  . Sleep disturbance 06/2013   sleep study, mild abnormality, no criteria for CPAP    Past Surgical History:   Past Surgical History:  Procedure Laterality Date  .  ABDOMINAL HYSTERECTOMY    . CARDIOVERSION     x 2  . CARDIOVERSION N/A 02/27/2013   Procedure: CARDIOVERSION;  Surgeon: Lelon Perla, MD;  Location: [facility] ENDOSCOPY;  Service: Cardiovascular;  Laterality: N/A;  . CHOLECYSTECTOMY    . COLONOSCOPY     Diverticulosis, colon polyps, repeat 2014, Dr. Deatra Ina  . ESOPHAGOGASTRODUODENOSCOPY N/A 05/16/2014   Procedure: ESOPHAGOGASTRODUODENOSCOPY (EGD);  Surgeon: Wonda Horner, MD;  Location: [facility] ORS;  Service: Gastroenterology;  Laterality: N/A;  . HERNIA REPAIR  2009    Social History:   Social History   Social History  . Marital status: Single    Spouse name: N/A  . Number of children: 5  . Years of education: college   Occupational History  . Disabled.    Social History Main Topics  . Smoking status: Never Smoker  . Smokeless tobacco: Never Used  . Alcohol use 0.0 oz/week     Comment: drinks 1 glass of wine every 2 weeks on average (occasional)  . Drug use: No  . Sexual activity: Not Currently    Birth control/ protection: Surgical   Other Topics Concern  . Not on file   Social History Narrative   Pt lives in [place] with her son.  Unemployed.  Right handed.     Education college       Allergies   Albuterol; Amiodarone; Atorvastatin; Livalo [pitavastatin]; and Propoxyphene n-acetaminophen  Family history:   Family History  Problem Relation Age of Onset  . Pneumonia Father     deceased  . Hypertension Father   . Cancer Father   . Multiple sclerosis Mother     hx  . Emphysema Other     runs in the family    Current Medications:   Prior to Admission medications   Medication Sig Start Date End Date Taking? Authorizing Provider  allopurinol (ZYLOPRIM) 300 MG tablet TAKE 1 TABLET BY MOUTH EVERY DAY Patient taking differently: TAKE 300 MG BY MOUTH DAILY AS NEEDED FOR GOUT 11/18/15  Yes Camelia Eng Tysinger, PA-C  ALPRAZolam Duanne Moron) 1 MG tablet Take 1 tablet (1 mg total) by mouth at bedtime as needed for anxiety.  08/20/16  Yes Camelia Eng Tysinger, PA-C  carvedilol (COREG) 12.5 MG tablet Take 1 tablet (12.5 mg total) by mouth 2 (two) times daily with a meal. 10/20/16  Yes Patrecia Pour, MD  colchicine (COLCRYS) 0.6 MG tablet Take 1 tablet (0.6 mg total) by mouth daily as needed. Reported on 01/21/2016 Patient taking differently: Take 0.6 mg by mouth daily as needed (GOUT). Reported on 01/21/2016 01/21/16  Yes Camelia Eng Tysinger, PA-C  dabigatran (PRADAXA) 150 MG CAPS capsule Take 1 capsule (150 mg total) by mouth 2 (two) times daily. 12/16/15  Yes Thompson Grayer, MD  FLUoxetine (PROZAC) 20 MG tablet TAKE 1 TABLET(20 MG) BY MOUTH DAILY 10/15/16  Yes Camelia Eng Tysinger, PA-C  furosemide (LASIX) 40 MG tablet Take 1 tablet (40 mg total) by mouth daily. 10/20/16  Yes Patrecia Pour, MD  levothyroxine (SYNTHROID, LEVOTHROID) 50 MCG  tablet Take 50 mcg by mouth daily before breakfast.   Yes Historical Provider, MD  losartan (COZAAR) 25 MG tablet Take 1 tablet (25 mg total) by mouth daily. 06/18/16 11/11/16 Yes Deboraha Sprang, MD  metFORMIN (GLUCOPHAGE) 500 MG tablet Take 1 tablet (500 mg total) by mouth 2 (two) times daily with a meal. 1 tablet po BID for diabetes 05/13/16  Yes Camelia Eng Tysinger, PA-C  methimazole (TAPAZOLE) 5 MG tablet take 2 tabs (10mg ) in the morning and 1 tab (5mg ) at night 10/20/16  Yes Patrecia Pour, MD  potassium chloride SA (K-DUR,KLOR-CON) 20 MEQ tablet Take 1 tablet (20 mEq total) by mouth daily. 10/20/16  Yes Patrecia Pour, MD  rosuvastatin (CRESTOR) 20 MG tablet Take 1 tablet (20 mg total) by mouth daily. 08/21/16  Yes Camelia Eng Tysinger, PA-C  traZODone (DESYREL) 50 MG tablet TAKE 1/2 TO 1 TABLET BY MOUTH EVERY NIGHT AT BEDTIME AS NEEDED FOR SLEEP 05/13/16  Yes Camelia Eng Tysinger, PA-C  VENTOLIN HFA 108 (90 Base) MCG/ACT inhaler INHALE 2 PUFFS INTO THE LUNGS EVERY 6 HOURS AS NEEDED FOR WHEEZING OR SHORTNESS OF BREATH 09/22/16  Yes Carlena Hurl, PA-C    Physical Exam:   Vitals:   11/12/16 0600 11/12/16 0630 11/12/16 0700  11/12/16 0744  BP: 150/94 (!) 173/118  (!) 163/109  Pulse: 81   84  Resp: 14  21 15   Temp:      TempSrc:      SpO2: 95%  99% 97%     Physical Exam: Blood pressure (!) 163/109, pulse 84, temperature 98.7 F (37.1 C), temperature source Oral, resp. rate 15, SpO2 97 %. Gen: Morbidly obese female, in mild distress with active diarrhea and dyspnea with labored breathing. Head: Normocephalic, atraumatic. Eyes: Pupils equal, round and reactive to light. Extraocular movements intact.  Sclerae nonicteric. No lid lag. Mouth: Oropharynx reveals moist mucous membranes. Dentition is fair. Neck: Supple, no thyromegaly, no lymphadenopathy, no jugular venous distention. Chest: Lungs are clear to auscultation in the bases with good air movement. Upper airway wheeze. CV: Heart sounds are irregularly irregular and tachycardic. No murmurs, rubs, clicks, or gallops.  Abdomen: Soft, nontender, nondistended with normal active bowel sounds. No hepatosplenomegaly or palpable masses. Extremities: Extremities are without clubbing, or cyanosis. Pedal pulses 2+. 1+ bilateral edema to the lower extremities. Skin: Warm and dry. No rashes, lesions or wounds. Neuro: Alert and oriented times 3; grossly nonfocal.  Psych: Insight is good and judgment is appropriate. Mood and affect flat/depressed.   Data Review:    Labs: Basic Metabolic Panel:  Recent Labs Lab 11/11/16 2215  NA 138  K 3.8  CL 106  CO2 24  GLUCOSE 206*  BUN 17  CREATININE 0.99  CALCIUM 9.4   Liver Function Tests:  Recent Labs Lab 11/11/16 2215  AST 23  ALT 19  ALKPHOS 65  BILITOT 0.7  PROT 7.5  ALBUMIN 3.7   No results for input(s): LIPASE, AMYLASE in the last 168 hours. No results for input(s): AMMONIA in the last 168 hours. CBC:  Recent Labs Lab 11/11/16 2215  WBC 8.2  NEUTROABS 4.6  HGB 11.7*  HCT 35.4*  MCV 89.8  PLT 204   Cardiac Enzymes:  Recent Labs Lab 11/11/16 2215 11/12/16 0647  TROPONINI 0.05* 0.03*     BNP (last 3 results) No results for input(s): PROBNP in the last 8760 hours. CBG:  Recent Labs Lab 11/12/16 0152 11/12/16 0750  GLUCAP 145* 124*  Urinalysis    Component Value Date/Time   COLORURINE COLORLESS (A) 10/18/2016 0306   APPEARANCEUR CLEAR 10/18/2016 0306   LABSPEC 1.004 (L) 10/18/2016 0306   PHURINE 7.0 10/18/2016 0306   GLUCOSEU NEGATIVE 10/18/2016 0306   HGBUR NEGATIVE 10/18/2016 0306   HGBUR negative 09/02/2009 1428   BILIRUBINUR NEGATIVE 10/18/2016 0306   BILIRUBINUR n 08/02/2015 1125   KETONESUR NEGATIVE 10/18/2016 0306   PROTEINUR NEGATIVE 10/18/2016 0306   UROBILINOGEN negative 08/02/2015 1125   UROBILINOGEN 0.2 08/16/2011 2150   NITRITE NEGATIVE 10/18/2016 0306   LEUKOCYTESUR NEGATIVE 10/18/2016 0306      Radiographic Studies: Dg Chest Portable 1 View  Result Date: 11/11/2016 CLINICAL DATA:  Acute onset of dry cough and shortness of breath. Mid chest pain. Initial encounter. EXAM: PORTABLE CHEST 1 VIEW COMPARISON:  Chest radiograph performed 10/17/2016 FINDINGS: The lungs are well-aerated CT. Vascular congestion is noted, with mild right basilar scarring or atelectasis. There is no evidence of pleural effusion or pneumothorax. The cardiomediastinal silhouette is mildly enlarged. No acute osseous abnormalities are seen. IMPRESSION: Vascular congestion and mild cardiomegaly. Mild right basilar atelectasis or scarring. Electronically Signed   By: Garald Balding M.D.   On: 11/11/2016 22:40     EKG: Independently reviewed. Atrial fibrillation with a ventricular rate of 128 bpm.   Assessment/Plan:   Principal Problem:   Atrial fibrillation with rapid ventricular response (HCC)/Acute on chronic systolic and diastolic CHF/demand ischemia Patient will be admitted to the stepdown unit and placed on a Cardizem drip with transition to oral Cardizem when stable. Patient has evidence of mild volume overload with pulmonary vascular congestion, dyspnea and  pedal edema so we'll give 1 dose of 40 mg of IV Lasix now. Controlling her heart rate should improve her heart failure symptoms. Patient's mild troponin elevation is likely from rapid A. fib. She is already anticoagulated with pradaxa. Will request cardiology consultation through [place]. Resume Tapazole, which she has not taken in the past several days.  Active Problems:   Chronic diarrhea GI pathogen and C. difficile studies have been requested. If these are negative, can start on an antidiarrheal. Hold Prozac given patient reports that diarrhea began with this medication addition.    Essential hypertension The patient's blood pressure is markedly elevated. She has been placed on a Cardizem drip, will be given 1 dose of Lasix, and has been ordered hydralazine as needed. Additionally, we will resume her Coreg which she has not taken in the past several days.    Cardiomyopathy, secondary ([place]) Last 2-D echo was done 10/18/16 and showed an EF of 25%. Strict I/O and daily weights will be ordered.    Obesity There is no height or weight on file to calculate BMI. Will request a height and weight per nursing.    Dyslipidemia Resume Crestor.    History of sarcoidosis    Generalized anxiety disorder Patient thinks her symptoms may have began with the switch from Klonopin to Prozac. Hold Prozac.    Type 2 diabetes mellitus with complication, without long-term current use of insulin (HCC) Hold oral medications and treat with insulin sensitive SSI and a carbohydrate modified diet for now.   Other information:   DVT prophylaxis: Pradaxa ordered. Code Status: Full code. Family Communication: No family currently at the bedside.  Disposition Plan: Home 24-48 hours depending on progress over the next 24 hours. Consults called: Cardiology (message routed toTrish via Epic). Admission status: Observation.  The medical decision making on this patient was of high complexity  and the patient is at high  risk for clinical deterioration, therefore this is a level 3 visit.   Stephanie [name] [facility] Pager [phone] Cell: [phone]   If 7PM-7AM, please contact night-coverage [email or web address] Password [email or web address] 11/12/2016, 10:06 AM

## 2016-11-12 NOTE — ED Notes (Signed)
Patient given breakfast tray.

## 2016-11-12 NOTE — Consult Note (Signed)
Cardiology Consult    Patient ID: Stephanie [name] MRN: [id number], DOB/AGE: May 15, 1951   Admit date: 11/11/2016 Date of Consult: 11/12/2016  Primary Physician: [name], PA-C Primary Cardiologist: Dr. Rayann Heman  Requesting Provider: [facility]  Patient Profile    Stephanie [name] is a 66 yo female well known to our service for paroxysmal atrial fibrillation and systolic heart failure.  She has additional PMH significant for sarcoidosis, HTN, cardiomyopathy, HLD, and DM. She presented to ED for diarrhea, shortness of breath, and chest tightness. She was subsequently found to be in Afib RVR with rates in the 120s. Cardiology was consulted for further management of her Afib and CHF.  Past Medical History   Past Medical History:  Diagnosis Date   Anemia    Ankle fracture, right    x 2   Antineoplastic and immunosuppressive drugs causing adverse effect in therapeutic use    Asthma    Atrial fibrillation (HCC)    Chronic venous hypertension without complications    Colon polyps    Colon polyps 2009   Coronary artery disease    no CAD by cath 11.2010   Depressive disorder, not elsewhere classified    Diastolic CHF, chronic ([place])    Diverticulosis of colon 2009   colonoscopy   Dyslipidemia    Glucose intolerance (impaired glucose tolerance)    hgb AIC 6.1 09/02/2009   Gout    Hepatitis, unspecified    hx of   Hx of hysterectomy 2002   Hyperlipidemia    Hypertension    Irritable bowel syndrome    Lung nodule    74mm RLL nodule on CT Chest  07/2009, resolved by 2011 CT   Noncompliance    Obesity    Persistent atrial fibrillation (DISH)    s/p DCCV 07/2009; Pradaxa Rx   PONV (postoperative nausea and vomiting)    Sarcoidosis ([place])    dx by eye exam   Sleep disturbance 06/2013   sleep study, mild abnormality, no criteria for CPAP    Past Surgical History:  Procedure Laterality Date   ABDOMINAL HYSTERECTOMY     CARDIOVERSION     x 2    CARDIOVERSION N/A 02/27/2013   Procedure: CARDIOVERSION;  Surgeon: Lelon Perla, MD;  Location: [facility] ENDOSCOPY;  Service: Cardiovascular;  Laterality: N/A;   CHOLECYSTECTOMY     COLONOSCOPY     Diverticulosis, colon polyps, repeat 2014, Dr. Deatra Ina   ESOPHAGOGASTRODUODENOSCOPY N/A 05/16/2014   Procedure: ESOPHAGOGASTRODUODENOSCOPY (EGD);  Surgeon: Wonda Horner, MD;  Location: [facility] ORS;  Service: Gastroenterology;  Laterality: N/A;   HERNIA REPAIR  2009     Allergies  Allergies  Allergen Reactions   Albuterol     Palpitations, intolerance   Amiodarone     adverse reaction: Tremor/gait disurbance   Atorvastatin     Body aches and pains--stiffness.    Livalo [Pitavastatin]     Body aches   Propoxyphene N-Acetaminophen Nausea And Vomiting    History of Present Illness    Stephanie [name] was last seen by our service in 06/18/16 when she saw Dr. [name] in follow up for her Afib.  At that time, amiodarone was held with resolution of previous neurotoxicity (tremors and gait disturbance).  She was continued on pradaxa (afib), coreg, losartan, statin, and PRN lasix at that time.  She recently presented to ED (10/17/16 - [id number]) for systolic CHF exacerbation and Afib RVR. She was discharged in rate-controlled Afib and with a diuretic regimen.    She  now presents to ED with Afib RVR, diarrhea, and SOB. She states her chest tightness corresponded with the onset of her heart racing on Monday evening (11/09/16).  She was symptomatic from Afib and diarrhea x 2 weeks including weakness.  She has not taken her pradaxa or any other medication since 11/09/16 secondary to not being able to get out of bed.  On arrival to ED, her troponin was minimally elevated 0.05 --> 0.03, and no ST changes were appreciated on EKG, although read is difficult in the presence of Afib RVR.  Cardizem gtt started in ED with resulting HR in the 80-100s. Cardizem was titrated to 12.5 mg/hr.  She states her chest tightness did not  radiate anywhere and improved with the start of cardizem gtt resulting in lower heart rate. She was scheduled to see Dr. [name] in clinic today 11/12/16.    Inpatient Medications     insulin aspart  0-5 Units Subcutaneous QHS   insulin aspart  0-9 Units Subcutaneous TID WC    Family History    Family History  Problem Relation Age of Onset   Pneumonia Father     deceased   Hypertension Father    Cancer Father    Multiple sclerosis Mother     hx   Emphysema Other     runs in the family    Social History    Social History   Social History   Marital status: Single    Spouse name: N/A   Number of children: 5   Years of education: college   Occupational History   Disabled.    Social History Main Topics   Smoking status: Never Smoker   Smokeless tobacco: Never Used   Alcohol use 0.0 oz/week     Comment: drinks 1 glass of wine every 2 weeks on average (occasional)   Drug use: No   Sexual activity: Not Currently    Birth control/ protection: Surgical   Other Topics Concern   Not on file   Social History Narrative   Pt lives in [place] with her son.  Unemployed.  Right handed.     Education college        Review of Systems    General:  No chills, fever, night sweats or weight changes, is feeling better today but still "weak"  Cardiovascular:  No current chest pain, dyspnea on exertion, edema, orthopnea, palpitations, paroxysmal nocturnal dyspnea. Dermatological: No rash, lesions/masses Respiratory: No cough, dyspnea Urologic: No hematuria, dysuria Abdominal:   No nausea, vomiting, bright red blood per rectum, melena, or hematemesis; positive for diarrhea Neurologic:  No visual changes, changes in mental status. All other systems reviewed and are otherwise negative except as noted above.  Physical Exam    Blood pressure 130/94, pulse 87, temperature 98.7 F (37.1 C), temperature source Oral, resp. rate 15, SpO2 96 %.  General: Pleasant,  NAD Psych: Normal affect. Neuro: Alert and oriented X 3. Moves all extremities spontaneously. HEENT: Normal  Neck: Supple without bruits or JVD. Lungs:  Resp regular and unlabored, clear in upper lung fields, diminished in B bases Heart: Irregularly irregular rhythm, regular rate, no murmurs. Abdomen: Soft, non-tender, non-distended, BS + x 4.  Extremities: No clubbing or cyanosis, trace edema. DP/PT/Radials 1+ and equal bilaterally.  Labs    Troponin (Point of Care Test) No results for input(s): TROPIPOC in the last 72 hours.  Recent Labs  11/11/16 2215 11/12/16 0647  TROPONINI 0.05* 0.03*   Lab Results  Component Value Date  WBC 8.2 11/11/2016   HGB 11.7 (L) 11/11/2016   HCT 35.4 (L) 11/11/2016   MCV 89.8 11/11/2016   PLT 204 11/11/2016    Recent Labs Lab 11/11/16 2215  NA 138  K 3.8  CL 106  CO2 24  BUN 17  CREATININE 0.99  CALCIUM 9.4  PROT 7.5  BILITOT 0.7  ALKPHOS 65  ALT 19  AST 23  GLUCOSE 206*   Lab Results  Component Value Date   CHOL 221 (H) 08/20/2016   HDL 60 08/20/2016   LDLCALC 140 (H) 08/20/2016   TRIG 103 08/20/2016   Lab Results  Component Value Date   DDIMER 0.46 05/16/2014     Radiology Studies    Dg Chest Portable 1 View  Result Date: 11/11/2016 CLINICAL DATA:  Acute onset of dry cough and shortness of breath. Mid chest pain. Initial encounter. EXAM: PORTABLE CHEST 1 VIEW COMPARISON:  Chest radiograph performed 10/17/2016 FINDINGS: The lungs are well-aerated CT. Vascular congestion is noted, with mild right basilar scarring or atelectasis. There is no evidence of pleural effusion or pneumothorax. The cardiomediastinal silhouette is mildly enlarged. No acute osseous abnormalities are seen. IMPRESSION: Vascular congestion and mild cardiomegaly. Mild right basilar atelectasis or scarring. Electronically Signed   By: Garald Balding M.D.   On: 11/11/2016 22:40   Dg Chest Port 1 View  Result Date: 10/17/2016 CLINICAL DATA:  Shortness  of breath for 1 week, progressive. EXAM: PORTABLE CHEST 1 VIEW COMPARISON:  05/16/2014 FINDINGS: The heart is enlarged, likely accentuated by portable technique and low lung volumes. No definite pulmonary edema. No large pleural effusion. Probable right infrahilar atelectasis. No pneumothorax. Grossly stable osseous structures. IMPRESSION: Enlarged cardiac silhouette, likely accentuated by AP portable technique and low lung volumes. Right infrahilar atelectasis. Electronically Signed   By: Jeb Levering M.D.   On: 10/17/2016 22:58    ECG & Cardiac Imaging    EKG 10/17/16: Afib in the 132, LVH  EKG 11/11/16: Afib RVR, 128, LVH  Echocardiogram 10/18/16: Study Conclusions - Left ventricle: The cavity size was mildly dilated. There was   moderate concentric hypertrophy. Systolic function was severely reduced. The estimated ejection fraction was 25%. Diffuse hypokinesis. The study is not technically sufficient to allow evaluation of LV diastolic function. - Mitral valve: Calcified annulus. Mildly thickened leaflets .   There was mild regurgitation. - Left atrium: The atrium was severely dilated. - Right ventricle: Systolic function was moderately reduced. - Right atrium: The atrium was mildly dilated. - Tricuspid valve: There was mild regurgitation.  Assessment & Plan    1. Atrial fibrillation with RVR - cardizem gtt started and is now running at 12.5 mg/hr; telemetry with Afib in the 60-90s - left atrium severely dilated on echo 10/18/16 - K 3.8, replace if less than 3.5 - recommend checking Mag and TSH this admission (ordered); TSH was previously low on last ED visit and she was started on methimazole - hold off on repeat TTE for now while still in Afib - pt has not taken pradaxa since 11/09/16 - restart pradaxa for anticoagulation for Afib - previous ED admission was rate controlled on coreg; however, did not remain rate controlled on coreg, although chronic diarrhea may have played a  role. Recommend starting lopressor and titrating off of cardizem gtt in the presence of reduced EF. - consider OP DCCV after 3 weeks of AC since patient has been symptomatic; last cardioversion was unsuccessful but she subsequently self-converted to NSR - pt experienced amiodarone toxicity (  tremors, gait disturbance), although this maintained her in NSR - will discuss with MD for possible cardioversion vs antiarrhythmic agent - she is currently rate controlled Afib and reports feeling better, but is not completely asymptomatic   2. Chest tightness - pt reports chest tightness on the right side without radiation that corresponded with the onset of her "heart racing" on 11/09/16 and resolved with the onset of cardizem gtt and resolution of RVR - troponin 0.05 --> 0.03 - repeat EKG now that HR is less than 100 (ordered) - if return of symptoms, or changes in troponin or EKG, will consider ischemic workup   3. Acute on Chronic Systolic Heart Failure - BNP 1473.6 this admission - CXR with cardiomegaly and bilateral pleural effusions - continue lasix for diuresis - no Is & Os or weight charted - TTE 10/18/16 with LVEF 25% and left atrium dilation; diastolic function was not obtained - patient seems close to euvolemic, without significant edema or JVD, despite missing her lasix doses since Tues 11/10/16 - she has had poor PO intake in the presence of diarrhea and nausea   4. Diarrhea / chronic IBS - diarrhea x 2 weeks - C diff was negative - gastrointestinal panel was negative - stopped prozac to see if symptoms resolve - per primary team    Signed, Minette Brine, PA-C 11/12/2016, 1:12 PM

## 2016-11-12 NOTE — ED Notes (Signed)
Unable to collect urine sample, urine sample has been contaminated each time she goes.

## 2016-11-12 NOTE — Progress Notes (Signed)
This is a no charge note  Pending admission  66 year old lady with past medical history of A. fib, systolic congestive heart failure with EF 25%, hypothyroidism, sarcoidosis, hepatitis, hypertension, hyperlipidemia, diabetes mellitus, gout, who presents with diarrhea for 2 months. Found to have A. fib with RVR with heart rate up to 150. Troponin 0.05, BNP 1473. IV cardizem gtt was started. Pt is accepted to [facility] as inpt. C diff pcr pending.  Ivor Costa, MD  [facility] Pager [phone]  If 7PM-7AM, please contact night-coverage [email or web address] Password [email or web address] 11/12/2016, 3:03 AM

## 2016-11-13 DIAGNOSIS — I10 Essential (primary) hypertension: Secondary | ICD-10-CM

## 2016-11-13 DIAGNOSIS — E118 Type 2 diabetes mellitus with unspecified complications: Secondary | ICD-10-CM

## 2016-11-13 DIAGNOSIS — E6609 Other obesity due to excess calories: Secondary | ICD-10-CM

## 2016-11-13 DIAGNOSIS — Z862 Personal history of diseases of the blood and blood-forming organs and certain disorders involving the immune mechanism: Secondary | ICD-10-CM

## 2016-11-13 DIAGNOSIS — K529 Noninfective gastroenteritis and colitis, unspecified: Secondary | ICD-10-CM

## 2016-11-13 DIAGNOSIS — I5023 Acute on chronic systolic (congestive) heart failure: Secondary | ICD-10-CM

## 2016-11-13 LAB — GLUCOSE, CAPILLARY
GLUCOSE-CAPILLARY: 130 mg/dL — AB (ref 65–99)
GLUCOSE-CAPILLARY: 146 mg/dL — AB (ref 65–99)
Glucose-Capillary: 164 mg/dL — ABNORMAL HIGH (ref 65–99)
Glucose-Capillary: 141 mg/dL — ABNORMAL HIGH (ref 65–99)
Glucose-Capillary: 178 mg/dL — ABNORMAL HIGH (ref 65–99)

## 2016-11-13 LAB — BASIC METABOLIC PANEL
Anion gap: 5 (ref 5–15)
BUN: 17 mg/dL (ref 6–20)
CHLORIDE: 104 mmol/L (ref 101–111)
CO2: 30 mmol/L (ref 22–32)
CREATININE: 0.94 mg/dL (ref 0.44–1.00)
Calcium: 8.9 mg/dL (ref 8.9–10.3)
GFR calc Af Amer: >60 mL/min (ref >60)
GFR calc non Af Amer: >60 mL/min (ref >60)
Glucose, Bld: 145 mg/dL — ABNORMAL HIGH (ref 65–99)
POTASSIUM: 4.1 mmol/L (ref 3.5–5.1)
SODIUM: 139 mmol/L (ref 135–145)

## 2016-11-13 LAB — MAGNESIUM: MAGNESIUM: 1.7 mg/dL (ref 1.7–2.4)

## 2016-11-13 NOTE — Progress Notes (Signed)
[facility] PROGRESS NOTE  Stephanie [name] [id number] DOB: November 19, 1950 DOA: 11/11/2016 PCP: [name], PA-C  Interim summary and HPI 66 y.o. female with a PMH of chronic atrial fibrillation on Pradaxa, morbid obesity, sarcoidosis, coronary artery disease, chronic diastolic CHF, hypertension, morbid obesity and recent diagnosis of hyperthyroidism (interestingly, both Synthroid and Tapazole are listed as current medications), who presented to the ED today with a chief complaint of a 2-3 day history of shortness of breath associated with the sensation of a racing heartbeat in the setting of a two-month history of chronic diarrhea. Patient thinks that the diarrhea may have been associated with a medication change where she was taken off Klonopin and put on Prozac. She describes the stools as soupy and yellow in color, 3-4 episodes per day. The diarrhea is associated with intestinal cramping that is not relieved after having a bowel movement, and appears to be triggered by oral intake. She has not tried any antidiarrheal medications, but has been using Advil for cramps. There is been no history of recent antibiotic usage. No sick contacts. The patient also reports that she has not taken any of her routine medications for the past 3 days because she felt "so bad ". She describes feeling tired, worn out, and lacking energy as a result of this chronic diarrhea.  Assessment/Plan: Atrial fibrillation with rapid ventricular response (HCC)/Acute on chronic systolic and diastolic CHF/demand ischemia -rate now controlled and patient off cardizem -continue to be on A. Fib -will continue coreg BID and monitor HR on telemetry with activity -will follow cardiology rec's -compensated from CHF stand point now; continue losartan and oral lasix -daily weights and low sodium diet  -CHADsVASC score 3 -will continue pradaxa  Hyperthyroidism  -continue methimazole -will need follow up with  endocrinologist as already schedule   Chronic diarrhea: most likely associated to hyperthyroidism  -GI pathogen and C. difficile studies neg. -will use PRN antidiarrheal agents.  Essential hypertension -will continue coreg and losartan -BP stable now -will continue heart healthy diet   Cardiomyopathy, secondary ([place]) -Last 2-D echo was done 10/18/16 and showed an EF of 25%.  -continue Strict I/O and daily weights  -low sodium diet ordered and encouraged -will continue B-blocker, losartan and oral lasix -will need cath as an outpatient  -cardiology following her  Obesity -Body mass index is 42.96 kg/m. -low calorie diet and exercise discussed with patient   Dyslipidemia -continue Crestor.  History of sarcoidosis  Generalized anxiety disorder -Patient thinks her symptoms may have began with the switch from Klonopin to Prozac. Hold Prozac. -will recommend outpatient follow up with Psychiatry    Type 2 diabetes mellitus with complication, without long-term current use of insulin (HCC) -Holding oral medications while inpatient  -continue sensitive SSI and a carbohydrate modified diet   Code Status: Full Family Communication: no family at bedside  Disposition Plan: will move to telemetry bed, follow HR with activity, continue coreg and methimazole. PRN antidiarrheal agents (as no signs of infection appreciated).    Consultants:  Cardiology   Procedures:  See below for x-ray reports   Antibiotics:  None   HPI/Subjective: Afebrile, no CP and reports no SOB. Rate controlled off cardizem now.  Objective: Vitals:   11/13/16 0800 11/13/16 0830  BP:  126/89  Pulse: 86 84  Resp: 16   Temp: 97.3 F (36.3 C)     Intake/Output Summary (Last 24 hours) at 11/13/16 1047 Last data filed at 11/12/16 1900  Gross per 24 hour  Intake  51 ml  Output                0 ml  Net               51 ml   Filed Weights   11/12/16 1510 11/13/16 0500  Weight:  117.9 kg (259 lb 14.8 oz) 117.1 kg (258 lb 2.5 oz)    Exam:   General:  Afebrile, denies CP, SOB and reports no palpitation currently. Endorses stools are less loose.  Cardiovascular: S1 and S2, no murmurs, no rubs, no gallops. Rate controlled now.  Respiratory: good air movement, no wheezing, no frank crackles   Abdomen: obese, no distension, no tenderness  Musculoskeletal: trace edema bilaterally, no cyanosis   Data Reviewed: Basic Metabolic Panel:  Recent Labs Lab 11/11/16 2215 11/13/16 0355  NA 138 139  K 3.8 4.1  CL 106 104  CO2 24 30  GLUCOSE 206* 145*  BUN 17 17  CREATININE 0.99 0.94  CALCIUM 9.4 8.9  MG  --  1.7   Liver Function Tests:  Recent Labs Lab 11/11/16 2215  AST 23  ALT 19  ALKPHOS 65  BILITOT 0.7  PROT 7.5  ALBUMIN 3.7   CBC:  Recent Labs Lab 11/11/16 2215  WBC 8.2  NEUTROABS 4.6  HGB 11.7*  HCT 35.4*  MCV 89.8  PLT 204   Cardiac Enzymes:  Recent Labs Lab 11/11/16 2215 11/12/16 0647 11/12/16 1337  TROPONINI 0.05* 0.03* 0.03*   BNP (last 3 results)  Recent Labs  10/17/16 2306 11/11/16 2215  BNP 722.6* 1,473.6*   CBG:  Recent Labs Lab 11/12/16 1251 11/12/16 1713 11/12/16 2151 11/13/16 0458 11/13/16 0759  GLUCAP 196* 151* 158* 130* 164*    Recent Results (from the past 240 hour(s))  Gastrointestinal Panel by PCR , Stool     Status: None   Collection Time: 11/12/16  1:30 AM  Result Value Ref Range Status   Campylobacter species NOT DETECTED NOT DETECTED Final   Plesimonas shigelloides NOT DETECTED NOT DETECTED Final   Salmonella species NOT DETECTED NOT DETECTED Final   Yersinia enterocolitica NOT DETECTED NOT DETECTED Final   Vibrio species NOT DETECTED NOT DETECTED Final   Vibrio cholerae NOT DETECTED NOT DETECTED Final   Enteroaggregative E coli (EAEC) NOT DETECTED NOT DETECTED Final   Enteropathogenic E coli (EPEC) NOT DETECTED NOT DETECTED Final   Enterotoxigenic E coli (ETEC) NOT DETECTED NOT  DETECTED Final   Shiga like toxin producing E coli (STEC) NOT DETECTED NOT DETECTED Final   Shigella/Enteroinvasive E coli (EIEC) NOT DETECTED NOT DETECTED Final   Cryptosporidium NOT DETECTED NOT DETECTED Final   Cyclospora cayetanensis NOT DETECTED NOT DETECTED Final   Entamoeba histolytica NOT DETECTED NOT DETECTED Final   Giardia lamblia NOT DETECTED NOT DETECTED Final   Adenovirus F40/41 NOT DETECTED NOT DETECTED Final   Astrovirus NOT DETECTED NOT DETECTED Final   Norovirus GI/GII NOT DETECTED NOT DETECTED Final   Rotavirus A NOT DETECTED NOT DETECTED Final   Sapovirus (I, II, IV, and V) NOT DETECTED NOT DETECTED Final  C difficile quick scan w PCR reflex     Status: None   Collection Time: 11/12/16  9:41 AM  Result Value Ref Range Status   C Diff antigen NEGATIVE NEGATIVE Final   C Diff toxin NEGATIVE NEGATIVE Final   C Diff interpretation No C. difficile detected.  Final     Studies: Dg Chest Portable 1 View  Result Date: 11/11/2016 CLINICAL DATA:  Acute onset of dry cough and shortness of breath. Mid chest pain. Initial encounter. EXAM: PORTABLE CHEST 1 VIEW COMPARISON:  Chest radiograph performed 10/17/2016 FINDINGS: The lungs are well-aerated CT. Vascular congestion is noted, with mild right basilar scarring or atelectasis. There is no evidence of pleural effusion or pneumothorax. The cardiomediastinal silhouette is mildly enlarged. No acute osseous abnormalities are seen. IMPRESSION: Vascular congestion and mild cardiomegaly. Mild right basilar atelectasis or scarring. Electronically Signed   By: Garald Balding M.D.   On: 11/11/2016 22:40    Scheduled Meds: . carvedilol  12.5 mg Oral BID WC  . dabigatran  150 mg Oral Q12H  . furosemide  40 mg Oral Daily  . insulin aspart  0-5 Units Subcutaneous QHS  . insulin aspart  0-9 Units Subcutaneous TID WC  . losartan  25 mg Oral Daily  . methimazole  10 mg Oral Daily  . methimazole  5 mg Oral QHS  . potassium chloride SA  20 mEq  Oral Daily  . rosuvastatin  20 mg Oral Daily   Continuous Infusions:  Principal Problem:   Atrial fibrillation with rapid ventricular response (HCC) Active Problems:   Essential hypertension   Cardiomyopathy, secondary ([place])   Obesity   Dyslipidemia   History of sarcoidosis   Generalized anxiety disorder   Type 2 diabetes mellitus with complication, without long-term current use of insulin (HCC)   Acute systolic CHF (congestive heart failure) ([place])   Demand ischemia (HCC)   Chronic diarrhea   Atrial fibrillation with RVR ([place])    Time spent: 30 minutes    Barton Dubois  [facility] Pager [phone]. If 7PM-7AM, please contact night-coverage at [email or web address], password [email or web address] 11/13/2016, 10:47 AM  LOS: 1 day

## 2016-11-13 NOTE — Progress Notes (Signed)
Patient received from ICU alert and oriented. Voicing no complaints. Telemetry initiated. Condition unchanged. Eulas Post, RN

## 2016-11-13 NOTE — Progress Notes (Signed)
Progress Note  Patient Name: Stephanie [name] Date of Encounter: 11/13/2016  Primary Cardiologist: Dr. Rayann Heman  Subjective   Patient states she is feeling much better, is not nauseous and is not having anymore diarrhea. Pt found sitting up in bed eating breakfast.  Inpatient Medications    Scheduled Meds:  carvedilol  12.5 mg Oral BID WC   dabigatran  150 mg Oral Q12H   furosemide  40 mg Oral Daily   insulin aspart  0-5 Units Subcutaneous QHS   insulin aspart  0-9 Units Subcutaneous TID WC   losartan  25 mg Oral Daily   methimazole  10 mg Oral Daily   methimazole  5 mg Oral QHS   potassium chloride SA  20 mEq Oral Daily   rosuvastatin  20 mg Oral Daily   Continuous Infusions:  diltiazem (CARDIZEM) infusion Stopped (11/13/16 0000)   PRN Meds: acetaminophen, ALPRAZolam, ALPRAZolam, hydrALAZINE, ondansetron, traZODone   Vital Signs    Vitals:   11/13/16 0400 11/13/16 0404 11/13/16 0500 11/13/16 0600  BP:      Pulse: 84  92 78  Resp: (!) 23  (!) 27 18  Temp:  97.7 F (36.5 C)    TempSrc:  Oral    SpO2: 96%  93% 98%  Weight:   258 lb 2.5 oz (117.1 kg)   Height:        Intake/Output Summary (Last 24 hours) at 11/13/16 0736 Last data filed at 11/12/16 1900  Gross per 24 hour  Intake               51 ml  Output                0 ml  Net               51 ml   Filed Weights   11/12/16 1510 11/13/16 0500  Weight: 259 lb 14.8 oz (117.9 kg) 258 lb 2.5 oz (117.1 kg)    Telemetry    Afib in the 80s - Personally Reviewed  ECG    EKG 10/17/16: Afib 132 bmp, LVH  EKG 11/11/16: Afib RVR, 128, LVH  EKG 11/11/16: Afib with ventricular rate 74, prolonged QTc, LVH  Physical Exam   General: Well developed, well nourished, female appearing in no acute distress. Head: Normocephalic, atraumatic.  Neck: Supple without bruits, JVD. Lungs:  Resp regular and unlabored, occasional crackle, diminished in bases Heart: Irregularly irregular rhythm, regular rate, S1,  S2, no murmur; no rub. Abdomen: Soft, non-tender, non-distended with normoactive bowel sounds. No hepatomegaly. No rebound/guarding. No obvious abdominal masses. Extremities: No clubbing, cyanosis, No edema. Distal pedal pulses are 2+ bilaterally. Neuro: Alert and oriented X 3. Moves all extremities spontaneously. Psych: Normal affect.  Labs    Chemistry Recent Labs Lab 11/11/16 2215 11/13/16 0355  NA 138 139  K 3.8 4.1  CL 106 104  CO2 24 30  GLUCOSE 206* 145*  BUN 17 17  CREATININE 0.99 0.94  CALCIUM 9.4 8.9  PROT 7.5  --   ALBUMIN 3.7  --   AST 23  --   ALT 19  --   ALKPHOS 65  --   BILITOT 0.7  --   GFRNONAA 59* >60  GFRAA >60 >60  ANIONGAP 8 5     Hematology Recent Labs Lab 11/11/16 2215  WBC 8.2  RBC 3.94  HGB 11.7*  HCT 35.4*  MCV 89.8  MCH 29.7  MCHC 33.1  RDW 14.3  PLT 204  Cardiac Enzymes Recent Labs Lab 11/11/16 2215 11/12/16 0647 11/12/16 1337  TROPONINI 0.05* 0.03* 0.03*   No results for input(s): TROPIPOC in the last 168 hours.   BNP Recent Labs Lab 11/11/16 2215  BNP 1,473.6*     DDimer No results for input(s): DDIMER in the last 168 hours.   Radiology    Dg Chest Portable 1 View  Result Date: 11/11/2016 CLINICAL DATA:  Acute onset of dry cough and shortness of breath. Mid chest pain. Initial encounter. EXAM: PORTABLE CHEST 1 VIEW COMPARISON:  Chest radiograph performed 10/17/2016 FINDINGS: The lungs are well-aerated CT. Vascular congestion is noted, with mild right basilar scarring or atelectasis. There is no evidence of pleural effusion or pneumothorax. The cardiomediastinal silhouette is mildly enlarged. No acute osseous abnormalities are seen. IMPRESSION: Vascular congestion and mild cardiomegaly. Mild right basilar atelectasis or scarring. Electronically Signed   By: Garald Balding M.D.   On: 11/11/2016 22:40    Cardiac Studies   Echocardiogram 10/18/16: Study Conclusions - Left ventricle: The cavity size was mildly  dilated. There was moderate concentric hypertrophy. Systolic function was severely reduced. The estimated ejection fraction was 25%. Diffuse hypokinesis. The study is not technically sufficient to allow evaluation of LV diastolic function. - Mitral valve: Calcified annulus. Mildly thickened leaflets . There was mild regurgitation. - Left atrium: The atrium was severely dilated. - Right ventricle: Systolic function was moderately reduced. - Right atrium: The atrium was mildly dilated. - Tricuspid valve: There was mild regurgitation.   Patient Profile     Stephanie [name] is a 66 yo female well known to our service for paroxysmal atrial fibrillation and systolic heart failure.  She has additional PMH significant for sarcoidosis, HTN, cardiomyopathy, HLD, and DM. She presented to ED for diarrhea, shortness of breath, and chest tightness. She was subsequently found to be in Afib RVR with rates in the 120s. Cardiology was consulted for further management of her Afib and CHF.  Assessment & Plan    1. Atrial fibrillation with RVR / Prolonged QTc - cardizem gtt started and is now running at 12.5 mg/hr; telemetry with Afib in the 60-90s - left atrium severely dilated on echo 10/18/16 - K 4.1, replace if less than 3.5 - Mag  pending - TSH 0.01; continue methimazole - hold off on repeat TTE for now while still in Afib - pt has not taken pradaxa since 11/09/16 - restarted pradaxa for anticoagulation for Afib - cardizem gtt transitioned to coreg - she remains rate controlled on coreg with HR 70-90s and is tolerating this rhythm - monitor on tele today to make sure she remains rate controlled on coreg today - QTc 559 yesterday; difficult to interpret in the presence of Afib; no QTc prolonging agents on MAR except home trazodone, but she has not received a dose during this hospitalization - consider OP DCCV after 3 weeks of AC since patient has been symptomatic; last cardioversion was unsuccessful but she  subsequently self-converted to NSR - pt experienced amiodarone toxicity (tremors, gait disturbance), although this maintained her in NSR   2. Chest tightness - on interview in ED, pt reported chest tightness on the right side without radiation that corresponded with the onset of her "heart racing" on 11/09/16 and resolved with the onset of cardizem gtt and resolution of RVR - troponin 0.05 --> 0.03 --> 0.03 - repeat EKG now that HR is less than 100 (ordered) - if return of symptoms, or changes in troponin or EKG, will consider ischemic  workup   3. Acute on Chronic Systolic Heart Failure - BNP 1473.6 this admission - CXR with cardiomegaly and bilateral pleural effusions - continue lasix for diuresis - TTE 10/18/16 with LVEF 25% and left atrium dilation; diastolic function was not obtained - patient seems close to euvolemic, without significant edema or JVD, despite missing her lasix doses since Tues 11/10/16 - she has had poor PO intake in the presence of diarrhea and nausea   4. Diarrhea / chronic IBS - diarrhea x 2 weeks, now seems to be resolving - C diff was negative - gastrointestinal panel was negative - stopped prozac to see if symptoms resolve - per primary team   Signed, Minette Brine , PA-C 7:36 AM 11/13/2016 Pager: [phone]

## 2016-11-14 DIAGNOSIS — F411 Generalized anxiety disorder: Secondary | ICD-10-CM

## 2016-11-14 DIAGNOSIS — Z6841 Body Mass Index (BMI) 40.0 and over, adult: Secondary | ICD-10-CM

## 2016-11-14 LAB — GLUCOSE, CAPILLARY
GLUCOSE-CAPILLARY: 292 mg/dL — AB (ref 65–99)
Glucose-Capillary: 144 mg/dL — ABNORMAL HIGH (ref 65–99)

## 2016-11-14 LAB — CBC
HEMATOCRIT: 32.1 % — AB (ref 36.0–46.0)
Hemoglobin: 10.2 g/dL — ABNORMAL LOW (ref 12.0–15.0)
MCH: 29 pg (ref 26.0–34.0)
MCHC: 31.8 g/dL (ref 30.0–36.0)
MCV: 91.2 fL (ref 78.0–100.0)
Platelets: 196 10*3/uL (ref 150–400)
RBC: 3.52 MIL/uL — ABNORMAL LOW (ref 3.87–5.11)
RDW: 14.5 % (ref 11.5–15.5)
WBC: 5 10*3/uL (ref 4.0–10.5)

## 2016-11-14 LAB — BASIC METABOLIC PANEL
Anion gap: 7 (ref 5–15)
BUN: 21 mg/dL — AB (ref 6–20)
CALCIUM: 8.9 mg/dL (ref 8.9–10.3)
CO2: 27 mmol/L (ref 22–32)
Chloride: 104 mmol/L (ref 101–111)
Creatinine, Ser: 1.04 mg/dL — ABNORMAL HIGH (ref 0.44–1.00)
GFR calc non Af Amer: 56 mL/min — ABNORMAL LOW (ref >60)
Glucose, Bld: 126 mg/dL — ABNORMAL HIGH (ref 65–99)
Potassium: 4.2 mmol/L (ref 3.5–5.1)
Sodium: 138 mmol/L (ref 135–145)

## 2016-11-14 MED ORDER — DIPHENOXYLATE-ATROPINE 2.5-0.025 MG PO TABS: 1.0000 | ORAL_TABLET | Freq: Three times a day (TID) | ORAL | Status: DC | PRN

## 2016-11-14 MED ORDER — DIPHENOXYLATE-ATROPINE 2.5-0.025 MG PO TABS: 1.0000 | ORAL_TABLET | Freq: Three times a day (TID) | ORAL | 0 refills | Status: DC | PRN

## 2016-11-14 MED ORDER — OMEPRAZOLE 20 MG PO CPDR: 20.0000 mg | DELAYED_RELEASE_CAPSULE | Freq: Every day | ORAL | 1 refills | Status: DC

## 2016-11-14 NOTE — Discharge Summary (Signed)
Physician Discharge Summary  Stephanie [name] [id number] DOB: Oct 25, 1950 DOA: 11/11/2016  PCP: [name], PA-C  Admit date: 11/11/2016 Discharge date: 11/14/2016  Time spent: 35 minutes  Recommendations for Outpatient Follow-up:  1. Repeat BMET to follow electrolytes and renal function  2. Outpatient follow up with cardiology and endocrinology service for further evaluation and treatment. 3. Reassess patient volume status  4. Outpatient referral to see and psychiatry for further treatment of her anxiety    Discharge Diagnoses:  Principal Problem:   Atrial fibrillation with rapid ventricular response ([place]) Active Problems:   Essential hypertension   Cardiomyopathy, secondary ([place])   Obesity   Dyslipidemia   History of sarcoidosis   Generalized anxiety disorder   Type 2 diabetes mellitus with complication, without long-term current use of insulin (HCC)   Acute systolic CHF (congestive heart failure) (HCC)   Demand ischemia (HCC)   Chronic diarrhea   Atrial fibrillation with RVR ([place])   Discharge Condition: stable and improved. Discharge home with instructions to follow up with PCP, endocrinology service and cardiology service.  Diet recommendation: heart healthy and modified carb diet   Filed Weights   11/12/16 1510 11/13/16 0500 11/14/16 0500  Weight: 117.9 kg (259 lb 14.8 oz) 117.1 kg (258 lb 2.5 oz) 120.8 kg (266 lb 6.4 oz)    History of present illness:  66 y.o.femalewith a PMH of chronic atrial fibrillation on Pradaxa, morbid obesity, sarcoidosis, coronary artery disease, chronic diastolic CHF, hypertension, morbid obesity and recent diagnosis of hyperthyroidism (interestingly, both Synthroid and Tapazole are listed as current medications), who presented to the ED today with a chief complaint of a 2-3 day history of shortness of breath associated with the sensation of a racing heartbeat in the setting of a two-month history of chronic diarrhea. Patient  thinks that the diarrhea may have been associated with a medication change where she was taken off Klonopin and put on Prozac. She describes the stools as soupy and yellow in color, 3-4 episodes per day. The diarrhea is associated with intestinal cramping that is not relieved after having a bowel movement, and appears to be triggered by oral intake. She has not tried any antidiarrheal medications, but has been using Advil for cramps. There is been no history of recent antibiotic usage. No sick contacts. The patient also reports that she has not taken any of her routine medications for the past 3 days because she felt "so bad ". She describes feeling tired, worn out, and lacking energy as a result of this chronic diarrhea.  Hospital Course:  Atrial fibrillation with rapid ventricular response (HCC)/Acute on chronic systolic and diastolic CHF/demand ischemia -continue to be on A. Fib, but rate controlled -will continue coreg BID and per cardiology, plan si for outpatient follow up and if needed repeat cardioversion in 3 weeks or so. -compensated from CHF stand point now; continue losartan and oral lasix (40mg  daily) -encouraged to follow daily weights and low sodium diet  -CHADsVASC score 3 -will continue pradaxa  Hyperthyroidism  -continue methimazole -will need follow up with endocrinologist as already schedule (11/20/16)  Chronic diarrhea: most likely associated to hyperthyroidism  -GI pathogen and C. difficile studies neg. -will use PRN lomotil. -patient advise to be compliant with methimazole as instructed and to keep herself well hydrated  Essential hypertension -will continue coreg and losartan -BP stable now -advise to follow heart healthy diet   Cardiomyopathy, secondary ([place]) -Last 2-D echo was done 10/18/16 and showed an EF of 25%.  -continue  daily weights  -low sodium diet encouraged -will continue B-blocker, losartan and oral lasix -will need cath as an outpatient at some  point (as per cardiology rec's) -cardiology actively following her and with schedule appointment at discharge  Obesity -Body mass index is 42.96 kg/m. -low calorie diet and exercise discussed with patient   Dyslipidemia -continue Crestor.  History of sarcoidosis  Generalized anxiety disorder -Patient thinks her symptoms may have began with the switch from Klonopin to Prozac. Hold Prozac at discharge. -will recommend outpatient follow up with Psychiatry    Type 2 diabetes mellitus with complication, without long-term current use of insulin ([place]) -will resume home oral hypoglycemic regimen and will encourage her to follow modified carb diet   Procedures:  See below for x-ray reports   Consultations:  Cardiology   Discharge Exam: Vitals:   11/13/16 2217 11/14/16 0619  BP: 105/69 136/86  Pulse: 90 85  Resp: 16 16  Temp: 97.8 F (36.6 C) 97.6 F (36.4 C)    General:  Afebrile, denies CP, SOB and reports no palpitation currently. Endorses stools are less loose and endorses no further issues with diarrhea..  Cardiovascular: S1 and S2, no murmurs, no rubs, no gallops. Rate controlled now.  Respiratory: good air movement, no wheezing, no frank crackles   Abdomen: obese, no distension, no tenderness  Musculoskeletal: trace edema bilaterally, no cyanosis   Discharge Instructions   Discharge Instructions    (HEART FAILURE PATIENTS) Call MD:  Anytime you have any of the following symptoms: 1) 3 pound weight gain in 24 hours or 5 pounds in 1 week 2) shortness of breath, with or without a dry hacking cough 3) swelling in the hands, feet or stomach 4) if you have to sleep on extra pillows at night in order to breathe.    Complete by:  As directed    Diet - low sodium heart healthy    Complete by:  As directed    Discharge instructions    Complete by:  As directed    Take medications with good compliance as prescribed Follow heart healthy, modified carbohydrates and  low calorie diet  Keep yourself well hydrated  Follow up with PCP in 10 days (for hospital follow up) Please follow up with endocrinologist as previously scheduled (11/20/16)     Current Discharge Medication List    START taking these medications   Details  diphenoxylate-atropine (LOMOTIL) 2.5-0.025 MG tablet Take 1 tablet by mouth every 8 (eight) hours as needed for diarrhea or loose stools. Qty: 30 tablet, Refills: 0    omeprazole (PRILOSEC) 20 MG capsule Take 1 capsule (20 mg total) by mouth daily. Qty: 30 capsule, Refills: 1      CONTINUE these medications which have NOT CHANGED   Details  allopurinol (ZYLOPRIM) 300 MG tablet TAKE 1 TABLET BY MOUTH EVERY DAY Qty: 90 tablet, Refills: 2    ALPRAZolam (XANAX) 1 MG tablet Take 1 tablet (1 mg total) by mouth at bedtime as needed for anxiety. Qty: 45 tablet, Refills: 1    carvedilol (COREG) 12.5 MG tablet Take 1 tablet (12.5 mg total) by mouth 2 (two) times daily with a meal. Qty: 60 tablet, Refills: 0    colchicine (COLCRYS) 0.6 MG tablet Take 1 tablet (0.6 mg total) by mouth daily as needed. Reported on 01/21/2016 Qty: 45 tablet, Refills: 1    dabigatran (PRADAXA) 150 MG CAPS capsule Take 1 capsule (150 mg total) by mouth 2 (two) times daily. Qty: 60 capsule, Refills: 11  furosemide (LASIX) 40 MG tablet Take 1 tablet (40 mg total) by mouth daily. Qty: 30 tablet, Refills: 0    losartan (COZAAR) 25 MG tablet Take 1 tablet (25 mg total) by mouth daily. Qty: 90 tablet, Refills: 3    metFORMIN (GLUCOPHAGE) 500 MG tablet Take 1 tablet (500 mg total) by mouth 2 (two) times daily with a meal. 1 tablet po BID for diabetes Qty: 180 tablet, Refills: 1    methimazole (TAPAZOLE) 5 MG tablet take 2 tabs (10mg ) in the morning and 1 tab (5mg ) at night Qty: 90 tablet, Refills: 0    potassium chloride SA (K-DUR,KLOR-CON) 20 MEQ tablet Take 1 tablet (20 mEq total) by mouth daily. Qty: 30 tablet, Refills: 0    rosuvastatin (CRESTOR) 20 MG  tablet Take 1 tablet (20 mg total) by mouth daily. Qty: 90 tablet, Refills: 3    traZODone (DESYREL) 50 MG tablet TAKE 1/2 TO 1 TABLET BY MOUTH EVERY NIGHT AT BEDTIME AS NEEDED FOR SLEEP Qty: 90 tablet, Refills: 3    VENTOLIN HFA 108 (90 Base) MCG/ACT inhaler INHALE 2 PUFFS INTO THE LUNGS EVERY 6 HOURS AS NEEDED FOR WHEEZING OR SHORTNESS OF BREATH Qty: 18 g, Refills: 3      STOP taking these medications     FLUoxetine (PROZAC) 20 MG tablet      levothyroxine (SYNTHROID, LEVOTHROID) 50 MCG tablet        Allergies  Allergen Reactions  . Albuterol     Palpitations, intolerance  . Amiodarone     adverse reaction: Tremor/gait disurbance  . Atorvastatin     Body aches and pains--stiffness.   [name] [Pitavastatin]     Body aches  . Propoxyphene N-Acetaminophen Nausea And Vomiting   Follow-up Information    Ermalinda Barrios, PA-C Follow up on 11/24/2016.   Specialty:  Cardiology Why:  10:15AM Contact information: [address] [place] [address] [phone]        Ermalinda Barrios, PA-C Follow up.   Specialty:  Cardiology Contact information: [place] [address] [phone]        [name], PA-C. Schedule an appointment as soon as possible for a visit in 10 day(s).   Specialty:  Family Medicine Contact information: [place] [address] [phone]        Philemon Kingdom, MD Follow up on 11/20/2016.   Specialty:  Internal Medicine Contact information: [address] [place] [address] [phone]           The results of significant diagnostics from this hospitalization (including imaging, microbiology, ancillary and laboratory) are listed below for reference.    Significant Diagnostic Studies: Dg Chest Portable 1 View  Result Date: 11/11/2016 CLINICAL DATA:  Acute onset of dry cough and shortness of breath. Mid chest pain. Initial encounter. EXAM: PORTABLE CHEST  1 VIEW COMPARISON:  Chest radiograph performed 10/17/2016 FINDINGS: The lungs are well-aerated CT. Vascular congestion is noted, with mild right basilar scarring or atelectasis. There is no evidence of pleural effusion or pneumothorax. The cardiomediastinal silhouette is mildly enlarged. No acute osseous abnormalities are seen. IMPRESSION: Vascular congestion and mild cardiomegaly. Mild right basilar atelectasis or scarring. Electronically Signed   By: Garald Balding M.D.   On: 11/11/2016 22:40   Dg Chest Port 1 View  Result Date: 10/17/2016 CLINICAL DATA:  Shortness of breath for 1 week, progressive. EXAM: PORTABLE CHEST 1 VIEW COMPARISON:  05/16/2014 FINDINGS: The heart is enlarged,  likely accentuated by portable technique and low lung volumes. No definite pulmonary edema. No large pleural effusion. Probable right infrahilar atelectasis. No pneumothorax. Grossly stable osseous structures. IMPRESSION: Enlarged cardiac silhouette, likely accentuated by AP portable technique and low lung volumes. Right infrahilar atelectasis. Electronically Signed   By: Jeb Levering M.D.   On: 10/17/2016 22:58    Microbiology: Recent Results (from the past 240 hour(s))  Gastrointestinal Panel by PCR , Stool     Status: None   Collection Time: 11/12/16  1:30 AM  Result Value Ref Range Status   Campylobacter species NOT DETECTED NOT DETECTED Final   Plesimonas shigelloides NOT DETECTED NOT DETECTED Final   Salmonella species NOT DETECTED NOT DETECTED Final   Yersinia enterocolitica NOT DETECTED NOT DETECTED Final   Vibrio species NOT DETECTED NOT DETECTED Final   Vibrio cholerae NOT DETECTED NOT DETECTED Final   Enteroaggregative E coli (EAEC) NOT DETECTED NOT DETECTED Final   Enteropathogenic E coli (EPEC) NOT DETECTED NOT DETECTED Final   Enterotoxigenic E coli (ETEC) NOT DETECTED NOT DETECTED Final   Shiga like toxin producing E coli (STEC) NOT DETECTED NOT DETECTED Final   Shigella/Enteroinvasive E coli  (EIEC) NOT DETECTED NOT DETECTED Final   Cryptosporidium NOT DETECTED NOT DETECTED Final   Cyclospora cayetanensis NOT DETECTED NOT DETECTED Final   Entamoeba histolytica NOT DETECTED NOT DETECTED Final   Giardia lamblia NOT DETECTED NOT DETECTED Final   Adenovirus F40/41 NOT DETECTED NOT DETECTED Final   Astrovirus NOT DETECTED NOT DETECTED Final   Norovirus GI/GII NOT DETECTED NOT DETECTED Final   Rotavirus A NOT DETECTED NOT DETECTED Final   Sapovirus (I, II, IV, and V) NOT DETECTED NOT DETECTED Final  C difficile quick scan w PCR reflex     Status: None   Collection Time: 11/12/16  9:41 AM  Result Value Ref Range Status   C Diff antigen NEGATIVE NEGATIVE Final   C Diff toxin NEGATIVE NEGATIVE Final   C Diff interpretation No C. difficile detected.  Final     Labs: Basic Metabolic Panel:  Recent Labs Lab 11/11/16 2215 11/13/16 0355 11/14/16 0550  NA 138 139 138  K 3.8 4.1 4.2  CL 106 104 104  CO2 24 30 27   GLUCOSE 206* 145* 126*  BUN 17 17 21*  CREATININE 0.99 0.94 1.04*  CALCIUM 9.4 8.9 8.9  MG  --  1.7  --    Liver Function Tests:  Recent Labs Lab 11/11/16 2215  AST 23  ALT 19  ALKPHOS 65  BILITOT 0.7  PROT 7.5  ALBUMIN 3.7   CBC:  Recent Labs Lab 11/11/16 2215 11/14/16 0550  WBC 8.2 5.0  NEUTROABS 4.6  --   HGB 11.7* 10.2*  HCT 35.4* 32.1*  MCV 89.8 91.2  PLT 204 196   Cardiac Enzymes:  Recent Labs Lab 11/11/16 2215 11/12/16 0647 11/12/16 1337  TROPONINI 0.05* 0.03* 0.03*   BNP: BNP (last 3 results)  Recent Labs  10/17/16 2306 11/11/16 2215  BNP 722.6* 1,473.6*   CBG:  Recent Labs Lab 11/13/16 0759 11/13/16 1124 11/13/16 1643 11/13/16 2211 11/14/16 0800  GLUCAP 164* 141* 178* 146* 144*    Signed:  Barton Dubois MD.  [facility] 11/14/2016, 9:44 AM

## 2016-11-16 ENCOUNTER — Other Ambulatory Visit: Payer: Self-pay | Admitting: Medical

## 2016-11-17 ENCOUNTER — Other Ambulatory Visit: Payer: Self-pay | Admitting: Medical

## 2016-11-18 ENCOUNTER — Ambulatory Visit (INDEPENDENT_AMBULATORY_CARE_PROVIDER_SITE_OTHER): Payer: Medicare Other | Admitting: Medical

## 2016-11-18 ENCOUNTER — Encounter: Payer: Self-pay | Admitting: Medical

## 2016-11-18 VITALS — BP 126/82 | HR 75 | Wt 265.4 lb

## 2016-11-18 DIAGNOSIS — R946 Abnormal results of thyroid function studies: Secondary | ICD-10-CM

## 2016-11-18 DIAGNOSIS — E876 Hypokalemia: Secondary | ICD-10-CM

## 2016-11-18 DIAGNOSIS — E118 Type 2 diabetes mellitus with unspecified complications: Secondary | ICD-10-CM

## 2016-11-18 DIAGNOSIS — I429 Cardiomyopathy, unspecified: Secondary | ICD-10-CM

## 2016-11-18 DIAGNOSIS — F341 Dysthymic disorder: Secondary | ICD-10-CM

## 2016-11-18 DIAGNOSIS — I10 Essential (primary) hypertension: Secondary | ICD-10-CM

## 2016-11-18 DIAGNOSIS — E785 Hyperlipidemia, unspecified: Secondary | ICD-10-CM | POA: Diagnosis not present

## 2016-11-18 DIAGNOSIS — F411 Generalized anxiety disorder: Secondary | ICD-10-CM | POA: Diagnosis not present

## 2016-11-18 DIAGNOSIS — Z862 Personal history of diseases of the blood and blood-forming organs and certain disorders involving the immune mechanism: Secondary | ICD-10-CM

## 2016-11-18 DIAGNOSIS — G4700 Insomnia, unspecified: Secondary | ICD-10-CM | POA: Diagnosis not present

## 2016-11-18 DIAGNOSIS — K529 Noninfective gastroenteritis and colitis, unspecified: Secondary | ICD-10-CM

## 2016-11-18 DIAGNOSIS — I5023 Acute on chronic systolic (congestive) heart failure: Secondary | ICD-10-CM

## 2016-11-18 DIAGNOSIS — I4891 Unspecified atrial fibrillation: Secondary | ICD-10-CM

## 2016-11-18 DIAGNOSIS — R7989 Other specified abnormal findings of blood chemistry: Secondary | ICD-10-CM

## 2016-11-18 NOTE — Progress Notes (Signed)
Subjective: Chief Complaint  Patient presents with  . hospital follow up    hospital follow up    Here for hospital f/u.   She is accompanied by her 2 sons today.   One son lives with her and smells very strongly of marijuana, and the other son is in town from [place] visiting trying to help figure out why mom has been in and out of the hospital.   She was also hospitalized early this month for CHF and afib.    Admit date: 11/11/2016 Discharge date: 11/14/2016  Discharge Diagnoses:  Principal Problem:   Atrial fibrillation with rapid ventricular response ([place]) Active Problems:   Essential hypertension   Cardiomyopathy, secondary ([place])   Obesity   Dyslipidemia   History of sarcoidosis   Generalized anxiety disorder   Type 2 diabetes mellitus with complication, without long-term current use of insulin (HCC)   Acute systolic CHF (congestive heart failure) (HCC)   Demand ischemia (HCC)   Chronic diarrhea   Atrial fibrillation with RVR ([place])  Since her hospital discharge she continues to have 4 loose stools daily.   She has not picked up Lomotil from the pharmacy.  She is not sure what she is taking.  Doesn't have her pill bottles with her today.   She notes dyspnea at baseline.  We had long discussions today about her sleep, mood, diet, medication  She seems confused about her thyroid medications, and not sure she is taking col crys or omeprazole.  Says she doesn't have GERD.  Sleep - takes day time naps somewhat regularly.   Has trouble getting and staying asleep.  She sometimes goes a few days without sleep.    She still deals with a lot of anxiety, but she didn't want to elaborate on this.  She has had recent deaths in the family, and her dog diet few months ago which made her depressed for 2 months.     She does eat a fair amount of fried and fast food.  Her and son take turns cooking, sometimes its healthy.     She is checking weights.     Past Medical History:  Diagnosis  Date  . Anemia   . Ankle fracture, right    x 2  . Antineoplastic and immunosuppressive drugs causing adverse effect in therapeutic use   . Asthma   . Atrial fibrillation ([place])   . Chronic venous hypertension without complications   . Colon polyps   . Colon polyps 2009  . Coronary artery disease    no CAD by cath 11.2010  . Depressive disorder, not elsewhere classified   . Diastolic CHF, chronic ([place])   . Diverticulosis of colon 2009   colonoscopy  . Dyslipidemia   . Glucose intolerance (impaired glucose tolerance)    hgb AIC 6.1 09/02/2009  . Gout   . Hepatitis, unspecified    hx of  . Hx of hysterectomy 2002  . Hyperlipidemia   . Hypertension   . Irritable bowel syndrome   . Lung nodule    26mm RLL nodule on CT Chest  07/2009, resolved by 2011 CT  . Noncompliance   . Obesity   . Persistent atrial fibrillation ([place])    s/p DCCV 07/2009; Pradaxa Rx  . PONV (postoperative nausea and vomiting)   . Sarcoidosis ([place])    dx by eye exam  . Sleep disturbance 06/2013   sleep study, mild abnormality, no criteria for CPAP   Current Outpatient Prescriptions on File Prior  to Visit  Medication Sig Dispense Refill  . allopurinol (ZYLOPRIM) 300 MG tablet TAKE 1 TABLET BY MOUTH EVERY DAY (Patient taking differently: TAKE 300 MG BY MOUTH DAILY AS NEEDED FOR GOUT) 90 tablet 2  . ALPRAZolam (XANAX) 1 MG tablet Take 1 tablet (1 mg total) by mouth at bedtime as needed for anxiety. 45 tablet 1  . carvedilol (COREG) 12.5 MG tablet Take 1 tablet (12.5 mg total) by mouth 2 (two) times daily with a meal. 60 tablet 0  . colchicine (COLCRYS) 0.6 MG tablet Take 1 tablet (0.6 mg total) by mouth daily as needed. Reported on 01/21/2016 (Patient taking differently: Take 0.6 mg by mouth daily as needed (GOUT). Reported on 01/21/2016) 45 tablet 1  . dabigatran (PRADAXA) 150 MG CAPS capsule Take 1 capsule (150 mg total) by mouth 2 (two) times daily. 60 capsule 11  . diphenoxylate-atropine (LOMOTIL) 2.5-0.025 MG  tablet Take 1 tablet by mouth every 8 (eight) hours as needed for diarrhea or loose stools. 30 tablet 0  . furosemide (LASIX) 40 MG tablet Take 1 tablet (40 mg total) by mouth daily. 30 tablet 0  . metFORMIN (GLUCOPHAGE) 500 MG tablet TAKE 1 TABLET BY MOUTH TWICE DAILY WITH A MEAL 180 tablet 0  . omeprazole (PRILOSEC) 20 MG capsule Take 1 capsule (20 mg total) by mouth daily. 30 capsule 1  . potassium chloride SA (K-DUR,KLOR-CON) 20 MEQ tablet Take 1 tablet (20 mEq total) by mouth daily. 30 tablet 0  . rosuvastatin (CRESTOR) 20 MG tablet Take 1 tablet (20 mg total) by mouth daily. 90 tablet 3  . traZODone (DESYREL) 50 MG tablet TAKE 1/2 TO 1 TABLET BY MOUTH EVERY NIGHT AT BEDTIME AS NEEDED FOR SLEEP 90 tablet 3  . VENTOLIN HFA 108 (90 Base) MCG/ACT inhaler INHALE 2 PUFFS INTO THE LUNGS EVERY 6 HOURS AS NEEDED FOR WHEEZING OR SHORTNESS OF BREATH 18 g 3  . losartan (COZAAR) 25 MG tablet Take 1 tablet (25 mg total) by mouth daily. 90 tablet 3  . methimazole (TAPAZOLE) 5 MG tablet take 2 tabs (10mg ) in the morning and 1 tab (5mg ) at night 90 tablet 0  . [DISCONTINUED] propranolol (INDERAL) 60 MG tablet Take 60 mg by mouth daily.       No current facility-administered medications on file prior to visit.    ROS as in subjective  Objective: BP 126/82   Pulse 75   Wt 265 lb 6.4 oz (120.4 kg)   SpO2 96%   BMI 44.16 kg/m   Wt Readings from Last 3 Encounters:  11/18/16 265 lb 6.4 oz (120.4 kg)  11/14/16 266 lb 6.4 oz (120.8 kg)  10/28/16 269 lb 14.4 oz (122.4 kg)   General appearance: alert, no distress, WD/WN, seems a little dyspneic at times Oral cavity: MMM, no lesions Neck: supple, no lymphadenopathy, no thyromegaly, no masses, no JVD Heart: irregular, otherwise RRR, normal S1, S2, no murmurs Lungs: CTA bilaterally, no wheezes, rhonchi, or rales Abdomen: +bs, soft, non tender, non distended, no masses, no hepatomegaly, no splenomegaly Pulses: 1+ symmetric, upper and lower extremities,  normal cap refill Ext: no edema   Assessment: Encounter Diagnoses  Name Primary?  . Acute on chronic systolic CHF (congestive heart failure) ([place]) Yes  . Atrial fibrillation, unspecified type ([place])   . Cardiomyopathy, secondary ([place])   . Chronic diarrhea   . Essential hypertension   . Abnormal thyroid blood test   . Type 2 diabetes mellitus with complication, without long-term current use of  insulin ([place])   . Dyslipidemia   . Hypokalemia   . Insomnia, unspecified type   . History of sarcoidosis   . Generalized anxiety disorder   . Dysthymic disorder     Plan: Spent >45 minutes discussing her recent 2 hospitalizations, her medical problem list, her medications, and answered questions.     Advised she establish with psychiatry and counseling, encouraged sons to help push her to this.   Advised she use [employer] for public transportation to commute to gym.   She seems to be using transpiration as excuse as she notes [employer] bus will provide transportation.  Advised they call back when they get home to verify medications against my printed instructions today  We went over her upcoming appointments.   We will stop metformin for now in the event it is contributing to the diarrhea, but seems to be related to hyperthyroidism/abnormal recent thyroid activity.  Spend some time going over diet, and advised when she sees endocrinology in 2 days to also request dietitian consult there  Martin Majestic over the instructions below in great detail.     Spent > 45 minutes face to face with patient in discussion of symptoms, evaluation, plan and recommendations.     Patient Instructions  Follow up with A fib appointment Doristine Devoid) Thursday 3:30pm  This is for follow up on Afib.  Follow up with endocrinology appointment with Dr. Cruzita Lederer Friday 11/20/16 at 2pm  Let them know we have temporarily stopped metformin given the diarrhea  Ask about dietician consult with endocrinology  Ask about next steps with  the overactive thyroid issue that is new 10/2016  Follow up with cardiology appointment 11/24/16 at 10:15am with Geroge Baseman, PA  Ask about exercise, what to expect with treatment for afib and controlling heart failure/heart disease  When you all get home today, review her medication bottles at home against her discharge summary sheet and call back to help [place] verify what she is actually taking vs what she should and shouldn't be taking  STOP/Discontinue: Synthroid/Levothyroxine for thyroid Prozac/Fluoxetine for mood For now STOP Metformin/Glucophage in the event this may be causing diarrhea   Continue the rest of your medications as usual:  Coreg 12.5mg  twice daily for heart/BP  Losartan 25mg  daily for BP  Tapazole/Methimazole 5mg , 2 tablets in the morning, 1 tablet in the evening for overactive thyroid  Pradaxa 150mg  twice daily for blood clot prevention  Crestor 20mg  daily at bedtime for cholesterol  Lomotil tablet every 8 hours as needed for loose stools  Lasix/Furosemide 40mg  daily in the morning for swelling and heart failure prevention  Take K-dur/potassium 72meq daily along with Lasix  Omeprazole/Prilosec 20mg  daily yin the morning for acid reflux  Trazodone 50mg , 1 tablet at night to help with sleep  Xanax as needed at bedtime to help with sleep or anxiety  Allopurinol 300mg  daily for gout prevention  Colcrys 0.6mg  daily as needed for gout flare up  Sleep  Don't take day time naps  Try and get in the bed the same time every day  Don't read or watch tv in the bed  continue Trazodone, but if needed, we can consider other options  Mood/anxiety   Consider counseling  Go to the pool and swim daily  Get out of the house some  Have a hobby    [facility] [address] [place], [place] [address] [phone]    [facility] [phone]   [email or web address]  [address] [place], [place], [place] [address]  Rema Fendt, therapist  Or Toy Cookey, MA, clinical psychologist    Verneda Skill. Clarene Reamer, therapist [phone] [address] [place], [place] [address]   [facility] [phone] [address], [place], [place] [address]   Vic Ripper, therapist [phone] [address], [place], [place] [address]   [facility] [address] [place], [place] [address]    [facility] [phone] [place], [place],  [address]  Lina Sayre, therapist Dr. Lynder Parents, psychiatrist Dr. Milana Huntsman, child psychiatrist   [facility] and staff [phone] [address], [place], [place],  [address]

## 2016-11-18 NOTE — Patient Instructions (Addendum)
Follow up with A fib appointment Doristine Devoid) Thursday 3:30pm  This is for follow up on Afib.  Follow up with endocrinology appointment with Dr. Cruzita Lederer Friday 11/20/16 at 2pm  Let them know we have temporarily stopped metformin given the diarrhea  Ask about dietician consult with endocrinology  Ask about next steps with the overactive thyroid issue that is new 10/2016  Follow up with cardiology appointment 11/24/16 at 10:15am with Geroge Baseman, PA  Ask about exercise, what to expect with treatment for afib and controlling heart failure/heart disease  When you all get home today, review her medication bottles at home against her discharge summary sheet and call back to help [place] verify what she is actually taking vs what she should and shouldn't be taking  STOP/Discontinue: Synthroid/Levothyroxine for thyroid Prozac/Fluoxetine for mood For now STOP Metformin/Glucophage in the event this may be causing diarrhea   Continue the rest of your medications as usual:  Coreg 12.5mg  twice daily for heart/BP  Losartan 25mg  daily for BP  Tapazole/Methimazole 5mg , 2 tablets in the morning, 1 tablet in the evening for overactive thyroid  Pradaxa 150mg  twice daily for blood clot prevention  Crestor 20mg  daily at bedtime for cholesterol  Lomotil tablet every 8 hours as needed for loose stools  Lasix/Furosemide 40mg  daily in the morning for swelling and heart failure prevention  Take K-dur/potassium 27meq daily along with Lasix  Omeprazole/Prilosec 20mg  daily yin the morning for acid reflux  Trazodone 50mg , 1 tablet at night to help with sleep  Xanax as needed at bedtime to help with sleep or anxiety  Allopurinol 300mg  daily for gout prevention  Colcrys 0.6mg  daily as needed for gout flare up  Sleep  Don't take day time naps  Try and get in the bed the same time every day  Don't read or watch tv in the bed  continue Trazodone, but if needed, we can consider other  options  Mood/anxiety   Consider counseling  Go to the pool and swim daily  Get out of the house some  Have a hobby    [place] [address], [place], [place] [address] [phone]    [facility] [phone]  [email or web address] [address], [place], [place], [place] [address]  Rema Fendt, therapist  Or Toy Cookey, MA, clinical psychologist    Verneda Skill. Clarene Reamer, therapist [phone] [address] [place], [place] [address]   [facility] [phone] [address] [place], [place] [address]   Vic Ripper, therapist [phone] [address] [place], [place] [address]   [facility] [address] [place], [place] [address]    [facility] [phone] [place], [place], [place] [address]  Lina Sayre, therapist Dr. Lynder Parents, psychiatrist Dr. Milana Huntsman, child psychiatrist   [facility] and staff [phone] [address], [place], [place],  [address]

## 2016-11-19 ENCOUNTER — Encounter (HOSPITAL_COMMUNITY): Payer: Self-pay | Admitting: Nurse Practitioner

## 2016-11-19 ENCOUNTER — Ambulatory Visit (HOSPITAL_COMMUNITY)
Admission: RE | Admit: 2016-11-19 | Discharge: 2016-11-19 | Disposition: A | Discharge status: Home / Self Care | Payer: Medicare Other | Source: Ambulatory Visit | Attending: Nurse Practitioner | Admitting: Nurse Practitioner

## 2016-11-19 VITALS — BP 140/84 | HR 117 | Ht 65.0 in | Wt 266.2 lb

## 2016-11-19 DIAGNOSIS — F329 Major depressive disorder, single episode, unspecified: Secondary | ICD-10-CM | POA: Diagnosis not present

## 2016-11-19 DIAGNOSIS — E669 Obesity, unspecified: Secondary | ICD-10-CM | POA: Insufficient documentation

## 2016-11-19 DIAGNOSIS — E785 Hyperlipidemia, unspecified: Secondary | ICD-10-CM | POA: Diagnosis not present

## 2016-11-19 DIAGNOSIS — K589 Irritable bowel syndrome without diarrhea: Secondary | ICD-10-CM | POA: Insufficient documentation

## 2016-11-19 DIAGNOSIS — Z836 Family history of other diseases of the respiratory system: Secondary | ICD-10-CM | POA: Insufficient documentation

## 2016-11-19 DIAGNOSIS — J45909 Unspecified asthma, uncomplicated: Secondary | ICD-10-CM | POA: Diagnosis not present

## 2016-11-19 DIAGNOSIS — I110 Hypertensive heart disease with heart failure: Secondary | ICD-10-CM | POA: Insufficient documentation

## 2016-11-19 DIAGNOSIS — Z6841 Body Mass Index (BMI) 40.0 and over, adult: Secondary | ICD-10-CM | POA: Insufficient documentation

## 2016-11-19 DIAGNOSIS — Z86010 Personal history of colonic polyps: Secondary | ICD-10-CM | POA: Insufficient documentation

## 2016-11-19 DIAGNOSIS — I5032 Chronic diastolic (congestive) heart failure: Secondary | ICD-10-CM | POA: Insufficient documentation

## 2016-11-19 DIAGNOSIS — Z8249 Family history of ischemic heart disease and other diseases of the circulatory system: Secondary | ICD-10-CM | POA: Diagnosis not present

## 2016-11-19 DIAGNOSIS — Z9114 Patient's other noncompliance with medication regimen: Secondary | ICD-10-CM | POA: Diagnosis not present

## 2016-11-19 DIAGNOSIS — E0590 Thyrotoxicosis, unspecified without thyrotoxic crisis or storm: Secondary | ICD-10-CM | POA: Diagnosis not present

## 2016-11-19 DIAGNOSIS — M109 Gout, unspecified: Secondary | ICD-10-CM | POA: Insufficient documentation

## 2016-11-19 DIAGNOSIS — Z90710 Acquired absence of both cervix and uterus: Secondary | ICD-10-CM | POA: Diagnosis not present

## 2016-11-19 DIAGNOSIS — I481 Persistent atrial fibrillation: Secondary | ICD-10-CM | POA: Diagnosis not present

## 2016-11-19 DIAGNOSIS — Z9049 Acquired absence of other specified parts of digestive tract: Secondary | ICD-10-CM | POA: Insufficient documentation

## 2016-11-19 DIAGNOSIS — Z888 Allergy status to other drugs, medicaments and biological substances status: Secondary | ICD-10-CM | POA: Insufficient documentation

## 2016-11-19 DIAGNOSIS — Z7901 Long term (current) use of anticoagulants: Secondary | ICD-10-CM | POA: Insufficient documentation

## 2016-11-19 DIAGNOSIS — Z886 Allergy status to analgesic agent status: Secondary | ICD-10-CM | POA: Diagnosis not present

## 2016-11-19 DIAGNOSIS — D649 Anemia, unspecified: Secondary | ICD-10-CM | POA: Insufficient documentation

## 2016-11-19 DIAGNOSIS — G479 Sleep disorder, unspecified: Secondary | ICD-10-CM | POA: Diagnosis not present

## 2016-11-19 DIAGNOSIS — I4819 Other persistent atrial fibrillation: Secondary | ICD-10-CM

## 2016-11-19 DIAGNOSIS — D869 Sarcoidosis, unspecified: Secondary | ICD-10-CM | POA: Diagnosis not present

## 2016-11-19 DIAGNOSIS — I2510 Atherosclerotic heart disease of native coronary artery without angina pectoris: Secondary | ICD-10-CM | POA: Insufficient documentation

## 2016-11-19 DIAGNOSIS — Z809 Family history of malignant neoplasm, unspecified: Secondary | ICD-10-CM | POA: Diagnosis not present

## 2016-11-19 LAB — BASIC METABOLIC PANEL
BUN: 18 mg/dL (ref 7–25)
CALCIUM: 9.4 mg/dL (ref 8.6–10.4)
CO2: 26 mmol/L (ref 20–31)
CREATININE: 1.02 mg/dL — AB (ref 0.50–0.99)
Chloride: 106 mmol/L (ref 98–110)
GLUCOSE: 129 mg/dL — AB (ref 65–99)
Potassium: 4 mmol/L (ref 3.5–5.3)
Sodium: 142 mmol/L (ref 135–146)

## 2016-11-19 MED ORDER — CARVEDILOL 12.5 MG PO TABS: ORAL_TABLET | ORAL | 3 refills | Status: DC

## 2016-11-19 MED ORDER — CARVEDILOL 12.5 MG PO TABS: ORAL_TABLET | ORAL | 0 refills | Status: DC

## 2016-11-19 NOTE — Patient Instructions (Signed)
Your physician has recommended you make the following change in your medication:  1)Increase coreg to 25mg  in the morning (2 tablets) and 12.5mg  in the evening (1 tablet)

## 2016-11-19 NOTE — Progress Notes (Signed)
Primary Care Physician: [name], PA-C Referring Physician: [facility] f/u Cardiologist: Dr. Al Corpus is a 66 y.o. female with a h/o atrial fibrillation that had been treated with amiodarone that was stopped by neurology due to tremor and unsteady gait over a year ago According to the pt, she had been staying in [place].Marland Kitchen She has been in the hospital twice in the last month, initially for diarrhea and the last admission 11/11/16 for afib with rvr with fluid overload/CHF. She was found to have new onset hyperthyroidism and was started on tapazole. Her weight is stable and her son and daughter have come in from [place] and [place] to help care for their mother. She is due to see the endocrinologist tomorrow. The d/c instructions mentioned a cardioversion in 3- 4 weeks, or when her thyroid normalizes, to try to restore SR.  Today, she denies symptoms of palpitations, chest pain, shortness of breath, orthopnea, PND, lower extremity edema, dizziness, presyncope, syncope, or neurologic sequela. The patient is tolerating medications without difficulties and is otherwise without complaint today.   Past Medical History:  Diagnosis Date  . Anemia   . Ankle fracture, right    x 2  . Antineoplastic and immunosuppressive drugs causing adverse effect in therapeutic use   . Asthma   . Atrial fibrillation ([place])   . Chronic venous hypertension without complications   . Colon polyps   . Colon polyps 2009  . Coronary artery disease    no CAD by cath 11.2010  . Depressive disorder, not elsewhere classified   . Diastolic CHF, chronic ([place])   . Diverticulosis of colon 2009   colonoscopy  . Dyslipidemia   . Glucose intolerance (impaired glucose tolerance)    hgb AIC 6.1 09/02/2009  . Gout   . Hepatitis, unspecified    hx of  . Hx of hysterectomy 2002  . Hyperlipidemia   . Hypertension   . Irritable bowel syndrome   . Lung nodule    10mm RLL nodule on CT Chest  07/2009, resolved by  2011 CT  . Noncompliance   . Obesity   . Persistent atrial fibrillation ([place])    s/p DCCV 07/2009; Pradaxa Rx  . PONV (postoperative nausea and vomiting)   . Sarcoidosis ([place])    dx by eye exam  . Sleep disturbance 06/2013   sleep study, mild abnormality, no criteria for CPAP   Past Surgical History:  Procedure Laterality Date  . ABDOMINAL HYSTERECTOMY    . CARDIOVERSION     x 2  . CARDIOVERSION N/A 02/27/2013   Procedure: CARDIOVERSION;  Surgeon: Lelon Perla, MD;  Location: [facility] ENDOSCOPY;  Service: Cardiovascular;  Laterality: N/A;  . CHOLECYSTECTOMY    . COLONOSCOPY     Diverticulosis, colon polyps, repeat 2014, Dr. Deatra Ina  . ESOPHAGOGASTRODUODENOSCOPY N/A 05/16/2014   Procedure: ESOPHAGOGASTRODUODENOSCOPY (EGD);  Surgeon: Wonda Horner, MD;  Location: [facility] ORS;  Service: Gastroenterology;  Laterality: N/A;  . HERNIA REPAIR  2009    Current Outpatient Prescriptions  Medication Sig Dispense Refill  . allopurinol (ZYLOPRIM) 300 MG tablet TAKE 1 TABLET BY MOUTH EVERY DAY (Patient taking differently: TAKE 300 MG BY MOUTH DAILY AS NEEDED FOR GOUT) 90 tablet 2  . ALPRAZolam (XANAX) 1 MG tablet Take 1 tablet (1 mg total) by mouth at bedtime as needed for anxiety. 45 tablet 1  . carvedilol (COREG) 12.5 MG tablet Take 2 tablets (25mg ) in the morning and 1 tablet (12.5mg ) in the evening 60  tablet 0  . colchicine (COLCRYS) 0.6 MG tablet Take 1 tablet (0.6 mg total) by mouth daily as needed. Reported on 01/21/2016 (Patient taking differently: Take 0.6 mg by mouth daily as needed (GOUT). Reported on 01/21/2016) 45 tablet 1  . dabigatran (PRADAXA) 150 MG CAPS capsule Take 1 capsule (150 mg total) by mouth 2 (two) times daily. 60 capsule 11  . diphenoxylate-atropine (LOMOTIL) 2.5-0.025 MG tablet Take 1 tablet by mouth every 8 (eight) hours as needed for diarrhea or loose stools. 30 tablet 0  . furosemide (LASIX) 40 MG tablet Take 1 tablet (40 mg total) by mouth daily. 30 tablet 0  . losartan  (COZAAR) 25 MG tablet Take 1 tablet (25 mg total) by mouth daily. 90 tablet 3  . methimazole (TAPAZOLE) 5 MG tablet take 2 tabs (10mg ) in the morning and 1 tab (5mg ) at night 90 tablet 0  . omeprazole (PRILOSEC) 20 MG capsule Take 1 capsule (20 mg total) by mouth daily. 30 capsule 1  . potassium chloride SA (K-DUR,KLOR-CON) 20 MEQ tablet Take 1 tablet (20 mEq total) by mouth daily. 30 tablet 0  . rosuvastatin (CRESTOR) 20 MG tablet Take 1 tablet (20 mg total) by mouth daily. 90 tablet 3  . traZODone (DESYREL) 50 MG tablet TAKE 1/2 TO 1 TABLET BY MOUTH EVERY NIGHT AT BEDTIME AS NEEDED FOR SLEEP 90 tablet 3  . VENTOLIN HFA 108 (90 Base) MCG/ACT inhaler INHALE 2 PUFFS INTO THE LUNGS EVERY 6 HOURS AS NEEDED FOR WHEEZING OR SHORTNESS OF BREATH 18 g 3   No current facility-administered medications for this encounter.     Allergies  Allergen Reactions  . Albuterol     Palpitations, intolerance  . Amiodarone     adverse reaction: Tremor/gait disurbance  . Atorvastatin     Body aches and pains--stiffness.   [name] [Pitavastatin]     Body aches  . Propoxyphene N-Acetaminophen Nausea And Vomiting    Social History   Social History  . Marital status: Single    Spouse name: N/A  . Number of children: 5  . Years of education: college   Occupational History  . Disabled.    Social History Main Topics  . Smoking status: Never Smoker  . Smokeless tobacco: Never Used  . Alcohol use 0.0 oz/week     Comment: drinks 1 glass of wine every 2 weeks on average (occasional)  . Drug use: No  . Sexual activity: Not Currently    Birth control/ protection: Surgical   Other Topics Concern  . Not on file   Social History Narrative   Pt lives in [place] with her son.  Unemployed.  Right handed.     Education college       Family History  Problem Relation Age of Onset  . Pneumonia Father     deceased  . Hypertension Father   . Cancer Father   . Multiple sclerosis Mother     hx  .  Emphysema Other     runs in the family    ROS- All systems are reviewed and negative except as per the HPI above  Physical Exam: Vitals:   11/19/16 1559  BP: 140/84  Pulse: (!) 117  Weight: 266 lb 3.2 oz (120.7 kg)  Height: 5\' 5"  (1.651 m)   Wt Readings from Last 3 Encounters:  11/19/16 266 lb 3.2 oz (120.7 kg)  11/18/16 265 lb 6.4 oz (120.4 kg)  11/14/16 266 lb 6.4 oz (120.8 kg)  Labs: Lab Results  Component Value Date   NA 142 11/18/2016   K 4.0 11/18/2016   CL 106 11/18/2016   CO2 26 11/18/2016   GLUCOSE 129 (H) 11/18/2016   BUN 18 11/18/2016   CREATININE 1.02 (H) 11/18/2016   CALCIUM 9.4 11/18/2016   PHOS 3.9 10/18/2016   MG 1.7 11/13/2016   Lab Results  Component Value Date   INR 1.2 (H) 04/05/2013   Lab Results  Component Value Date   CHOL 221 (H) 08/20/2016   HDL 60 08/20/2016   LDLCALC 140 (H) 08/20/2016   TRIG 103 08/20/2016     GEN- The patient is well appearing, alert and oriented x 3 today.   Head- normocephalic, atraumatic Eyes-  Sclera clear, conjunctiva pink Ears- hearing intact Oropharynx- clear Neck- supple, no JVP Lymph- no cervical lymphadenopathy Lungs- Clear to ausculation bilaterally, normal work of breathing Heart- irregular rate and rhythm, no murmurs, rubs or gallops, PMI not laterally displaced GI- soft, NT, ND, + BS Extremities- no clubbing, cyanosis, or edema MS- no significant deformity or atrophy Skin- no rash or lesion Psych- euthymic mood, full affect Neuro- strength and sensation are intact  EKG- afib at 117 bpm, qrs int 98 ms, qtc 527 ms Epic records reviewed Echo-------------------------------------------------------------------- Study Conclusions  - Left ventricle: The cavity size was mildly dilated. There was   moderate concentric hypertrophy. Systolic function was severely   reduced. The estimated ejection fraction was 25%. Diffuse   hypokinesis. The study is not technically sufficient to allow    evaluation of LV diastolic function. - Mitral valve: Calcified annulus. Mildly thickened leaflets .   There was mild regurgitation. - Left atrium: The atrium was severely dilated. - Right ventricle: Systolic function was moderately reduced. - Right atrium: The atrium was mildly dilated. - Tricuspid valve: There was mild regurgitation.     Assessment and Plan:  1. Persistent Afib in the setting of hyperthyroidism  Increase carvedilol to 25 mg am and continue with 12.5 mg pm, for better rate control Continue pradaxa without missed doses Will plan on cardioversion when thyroid is stabilized Will see back in 4 weeks  2. Hyperthyroidism  Per endocrinology   3. CHF Weight daily Diuretics as ordered Avoid salt   F/u with Nena Polio, PA, 2/6 who can reevaluate  rate control at that time. I will see back in one month  Butch Penny C. Michal Call[place]ott, [facility] [address] [place], [place] [address] [phone]

## 2016-11-20 ENCOUNTER — Ambulatory Visit (INDEPENDENT_AMBULATORY_CARE_PROVIDER_SITE_OTHER): Payer: Medicare Other | Admitting: Internal Medicine

## 2016-11-20 ENCOUNTER — Other Ambulatory Visit: Payer: Self-pay | Admitting: Internal Medicine

## 2016-11-20 ENCOUNTER — Encounter: Payer: Self-pay | Admitting: Internal Medicine

## 2016-11-20 VITALS — BP 132/84 | HR 96 | Ht 63.5 in | Wt 266.0 lb

## 2016-11-20 DIAGNOSIS — E118 Type 2 diabetes mellitus with unspecified complications: Secondary | ICD-10-CM

## 2016-11-20 DIAGNOSIS — E0590 Thyrotoxicosis, unspecified without thyrotoxic crisis or storm: Secondary | ICD-10-CM | POA: Diagnosis not present

## 2016-11-20 LAB — POCT GLYCOSYLATED HEMOGLOBIN (HGB A1C): Hemoglobin A1C: 6.5

## 2016-11-20 MED ORDER — METHIMAZOLE 5 MG PO TABS: ORAL_TABLET | ORAL | 1 refills | Status: DC

## 2016-11-20 NOTE — Progress Notes (Addendum)
Patient ID: Stephanie [name], female   DOB: Jan 01, 1951, 66 y.o.   MRN: [id number]   HPI  Stephanie [name] is a 65 y.o.-year-old female, referred by her PCP, Stephanie [name], for evaluation for thyrotoxicosis and DM2, controlled. She is here with her daughter and her son offer part of the history  Thyrotoxicosis:  Patient was diagnosed with thyrotoxicosis ~a month ago when she presented to the hospital in A. fib with RVR.  She tells me that her A fib was dx 2013, but in last month she was admitted with A fib + RVR twice (10/17/2016 and 11/11/2016).  After the last admission, she was started on MMI 10 mg in am and 5 mg in pm this week. She feels a little better. She is on Carvedilol 12.5 mg 2x a day. She was advised by the cardiologist to increase the dose after she discusses with me.  I reviewed pt's thyroid tests: Lab Results  Component Value Date   TSH <0.010 (L) 11/12/2016   TSH 0.014 (L) 10/18/2016   TSH 1.913 04/15/2016   TSH 3.253 11/11/2015   TSH 4.690 (H) 08/02/2015   TSH 2.420 08/17/2014   TSH 5.052 (H) 08/14/2014   TSH 1.40 09/28/2013   TSH 0.728  08/02/2009   FREET4 1.75 (H) 11/12/2016   FREET4 1.68 (H) 10/19/2016   FREET4 1.29 08/02/2015   FREET4 1.50 08/17/2014   FREET4 1.08 08/02/2009    Pt denies feeling nodules in neck, hoarseness, dysphagia/odynophagia, SOB with lying down; she c/o: - + fatigue - + excessive sweating/heat intolerance  -started 3 mo ago - + improved tremors - no anxiety - + palpitations - + diarrhea (resolved) - no signif. weight loss - + hair loss this fall >> resolved. She remembers having a sinusitis in 07/2016. Pt does not have a FH of thyroid ds. No FH of thyroid cancer, but mother with MS. No h/o radiation tx to head or neck.  No seaweed or kelp, no recent contrast studies. No steroid use. No herbal supplements. No Biotin use.  DM2: - stopped Metformin 3 days ago b/c diarrhea - does not check sugars at home  Lab Results  Component  Value Date   HGBA1C 6.3 (H) 08/20/2016   ROS: Constitutional: + Please see history of present illness, + poor sleep, + excessive urination Eyes: no blurry vision, no xerophthalmia ENT: no sore throat, no nodules palpated in throat, no dysphagia/odynophagia, no hoarseness, + hypoacusis, + tinnitus Cardiovascular: no CP/+ SOB/+ palpitations/+ leg swelling Respiratory: no cough/+ SOB/+ wheezing Gastrointestinal: + N/no V/resolved D/C/+ heartburn Musculoskeletal: no muscle/+ joint aches Skin: no rashes, + easy bruising Neurological: + tremors/no numbness/tingling/dizziness Psychiatric: no depression/anxiety  Past Medical History:  Diagnosis Date  . Anemia   . Ankle fracture, right    x 2  . Antineoplastic and immunosuppressive drugs causing adverse effect in therapeutic use   . Asthma   . Atrial fibrillation ([place])   . Chronic venous hypertension without complications   . Colon polyps   . Colon polyps 2009  . Coronary artery disease    no CAD by cath 11.2010  . Depressive disorder, not elsewhere classified   . Diastolic CHF, chronic ([place])   . Diverticulosis of colon 2009   colonoscopy  . Dyslipidemia   . Glucose intolerance (impaired glucose tolerance)    hgb AIC 6.1 09/02/2009  . Gout   . Hepatitis, unspecified    hx of  . Hx of hysterectomy 2002  . Hyperlipidemia   . Hypertension   .  Irritable bowel syndrome   . Lung nodule    69mm RLL nodule on CT Chest  07/2009, resolved by 2011 CT  . Noncompliance   . Obesity   . Persistent atrial fibrillation ([place])    s/p DCCV 07/2009; Pradaxa Rx  . PONV (postoperative nausea and vomiting)   . Sarcoidosis ([place])    dx by eye exam  . Sleep disturbance 06/2013   sleep study, mild abnormality, no criteria for CPAP   Past Surgical History:  Procedure Laterality Date  . ABDOMINAL HYSTERECTOMY    . CARDIOVERSION     x 2  . CARDIOVERSION N/A 02/27/2013   Procedure: CARDIOVERSION;  Surgeon: Lelon Perla, MD;  Location[facility]  ENDOSCOPY;  Service: Cardiovascular;  Laterality: N/A;  . CHOLECYSTECTOMY    . COLONOSCOPY     Diverticulosis, colon polyps, repeat 2014, Dr. Deatra Ina  . ESOPHAGOGASTRODUODENOSCOPY N/A 05/16/2014   Procedure: ESOPHAGOGASTRODUODENOSCOPY (EGD);  Surgeon: Wonda Horner, MD;  Location: [facility] ORS;  Service: Gastroenterology;  Laterality: N/A;  . HERNIA REPAIR  2009   Social History   Social History  . Marital status: Single    Spouse name: N/A  . Number of children: 5  . Years of education: college   Occupational History  . Disabled.    Social History Main Topics  . Smoking status: Never Smoker  . Smokeless tobacco: Never Used  . Alcohol use 0.0 oz/week     Comment: drinks 1 glass of wine every 2 weeks on average (occasional)  . Drug use: No  . Sexual activity: Not Currently    Birth control/ protection: Surgical   Other Topics Concern  . Not on file   Social History Narrative   Pt lives in [place] with her son.  Unemployed.  Right handed.     Education college      Current Outpatient Prescriptions on File Prior to Visit  Medication Sig Dispense Refill  . allopurinol (ZYLOPRIM) 300 MG tablet TAKE 1 TABLET BY MOUTH EVERY DAY (Patient taking differently: TAKE 300 MG BY MOUTH DAILY AS NEEDED FOR GOUT) 90 tablet 2  . ALPRAZolam (XANAX) 1 MG tablet Take 1 tablet (1 mg total) by mouth at bedtime as needed for anxiety. 45 tablet 1  . carvedilol (COREG) 12.5 MG tablet Take 2 tablets (25mg ) in the morning and 1 tablet (12.5mg ) in the evening 90 tablet 3  . colchicine (COLCRYS) 0.6 MG tablet Take 1 tablet (0.6 mg total) by mouth daily as needed. Reported on 01/21/2016 (Patient taking differently: Take 0.6 mg by mouth daily as needed (GOUT). Reported on 01/21/2016) 45 tablet 1  . dabigatran (PRADAXA) 150 MG CAPS capsule Take 1 capsule (150 mg total) by mouth 2 (two) times daily. 60 capsule 11  . diphenoxylate-atropine (LOMOTIL) 2.5-0.025 MG tablet Take 1 tablet by mouth every 8 (eight) hours as  needed for diarrhea or loose stools. 30 tablet 0  . furosemide (LASIX) 40 MG tablet Take 1 tablet (40 mg total) by mouth daily. 30 tablet 0  . methimazole (TAPAZOLE) 5 MG tablet take 2 tabs (10mg ) in the morning and 1 tab (5mg ) at night 90 tablet 0  . omeprazole (PRILOSEC) 20 MG capsule Take 1 capsule (20 mg total) by mouth daily. 30 capsule 1  . potassium chloride SA (K-DUR,KLOR-CON) 20 MEQ tablet Take 1 tablet (20 mEq total) by mouth daily. 30 tablet 0  . rosuvastatin (CRESTOR) 20 MG tablet Take 1 tablet (20 mg total) by mouth daily. 90 tablet 3  .  traZODone (DESYREL) 50 MG tablet TAKE 1/2 TO 1 TABLET BY MOUTH EVERY NIGHT AT BEDTIME AS NEEDED FOR SLEEP 90 tablet 3  . VENTOLIN HFA 108 (90 Base) MCG/ACT inhaler INHALE 2 PUFFS INTO THE LUNGS EVERY 6 HOURS AS NEEDED FOR WHEEZING OR SHORTNESS OF BREATH 18 g 3  . losartan (COZAAR) 25 MG tablet Take 1 tablet (25 mg total) by mouth daily. 90 tablet 3  . [DISCONTINUED] propranolol (INDERAL) 60 MG tablet Take 60 mg by mouth daily.       No current facility-administered medications on file prior to visit.    Allergies  Allergen Reactions  . Albuterol     Palpitations, intolerance  . Amiodarone     adverse reaction: Tremor/gait disurbance  . Atorvastatin     Body aches and pains--stiffness.   [name] [Pitavastatin]     Body aches  . Propoxyphene N-Acetaminophen Nausea And Vomiting   Family History  Problem Relation Age of Onset  . Pneumonia Father     deceased  . Hypertension Father   . Cancer Father   . Multiple sclerosis Mother     hx  . Emphysema Other     runs in the family    PE: BP 132/84 (BP Location: Right Arm, Patient Position: Sitting)   Pulse 96   Ht 5' 3.5" (1.613 m)   Wt 266 lb (120.7 kg)   SpO2 95%   BMI 46.38 kg/m  Wt Readings from Last 3 Encounters:  11/20/16 266 lb (120.7 kg)  11/19/16 266 lb 3.2 oz (120.7 kg)  11/18/16 265 lb 6.4 oz (120.4 kg)   Constitutional: overweight, in NAD Eyes: PERRLA, EOMI, no  exophthalmos, no lid lag, no stare ENT: moist mucous membranes, no thyromegaly, no thyroid bruits, no cervical lymphadenopathy Cardiovascular: Tachycardia, irregularly irregular rhythm, No MRG Respiratory: CTA B Gastrointestinal: abdomen soft, NT, ND, BS+ Musculoskeletal: no deformities, strength intact in all 4 Skin: moist, warm, no rashes Neurological: Very mild tremor with outstretched hands, DTR normal in all 4  ASSESSMENT: 1. Thyrotoxicosis  2. DM2, controlled  PLAN:  1. Patient with a recently found low TSH, with thyrotoxic sxs: heat intolerance, hyperdefecation, palpitations, tremors - she does not appear to have exogenous causes for the low TSH.  - We discussed that possible causes of thyrotoxicosis are:  Graves ds   Thyroiditis toxic multinodular goiter/ toxic adenoma (I cannot feel nodules at palpation of her thyroid). - She had very recent TSH, fT3 and fT4 and they were abnormal. Today we will add thyroid stimulating antibodies to screen for Graves' disease.  - If the TSI antibodies are normal, we may need an uptake and scan to differentiate between the 3 above possible etiologies  - we discussed about possible modalities of treatment for the above conditions, to include methimazole use, radioactive iodine ablation or (last resort) surgery. She is already on methimazole, which we will continue. However, I discussed with the patient and her children that, due to her heart disease, I would suggest definitive treatment with radioactive iodine if patient turns out to have hyperthyroidism (overproduction of thyroid hormones from the thyroid) versus thyroiditis (leakage of thyroid hormones from the thyroid). I did explain that she may, hypothyroid after RAI treatment, however, this is a condition much easier to manage and much safer from her cardiovascular point of view. She and her children agree with the plan. - I advised her to increase her beta blockers at this time as suggested by  cardiology since she is tachycardic -  I advised her to join my chart to communicate easier - RTC in 2 months, but in 1 month for repeat labs  2. DM2 - We briefly addressed this today - Her diabetes appears controlled,  HbA1c today is 6.5% - However, she stopped Metformin 2/2 diarrhea, few days ago - Given her a sugar log and advised her how to check her blood sugars once a day and bring her sugar log back at next visit  Orders Placed This Encounter  Procedures  . Thyroid Stimulating Immunoglobulin  . POCT HgB A1C    NM THYROID SNG UPTAKE W/IMAGING  Order: [id number]  Status:  Final result Visible to patient:  No (Not Released) Dx:  Thyrotoxicosis without thyroid storm,...  Details   Reading Physician Reading Date Result Priority  Lavonia Dana, MD 01/15/2017   Narrative    CLINICAL DATA: Thyrotoxicosis without thyroid storm; TSH = 2.23  EXAM: THYROID SCAN AND UPTAKE - 24 HOURS  TECHNIQUE: Following the per oral administration of I-131 sodium iodide, the patient returned at 24 hours and uptake measurements were acquired with the uptake probe centered on the neck. Thyroid imaging was performed following the intravenous administration of the Tc-20m Pertechnetate.  RADIOPHARMACEUTICALS: 11.3 microCuries I-131 sodium iodide orally and 10 mCi technetium-67m pertechnetate IV  COMPARISON: None  FINDINGS: 24 hour radio iodine uptake is calculated at 16%, within the normal range.  Images of the thyroid gland in 3 projections are normal.  No focal areas of increased or decreased tracer localization seen.  IMPRESSION: Normal thyroid scan.  Normal 24 hour radio iodine uptake of 16%.   Electronically Signed By: Lavonia Dana M.D. On: 01/15/2017 09:43        [name], possibly due to normalization of TFTs (she missed the Uptake and scan schedule earlier in the year...) Component     Latest Ref Rng & Units 12/21/2016  TSH     0.35 - 4.50  uIU/mL 2.23  Triiodothyronine,Free,Serum     2.3 - 4.2 pg/mL 3.0  T4,Free(Direct)     0.60 - 1.60 ng/dL 0.86  TSI     <140 % baseline <89   Will continue to follow on [facility]i for now.   Philemon Kingdom, MD PhD [facility]

## 2016-11-20 NOTE — Patient Instructions (Addendum)
Please continue Methimazole 10 mg in am and 5 mg in pm.  Please increase Carvedilol to 25 mg in am and 12.5 mg in pm.  Start checking sugars 1x a day, rotating check times.  Please come back for labs in 1 month and for a visit in 2 months.  Please stop at [facility] for labs.   Hyperthyroidism Hyperthyroidism is when the thyroid is too active (overactive). Your thyroid is a large gland that is located in your neck. The thyroid helps to control how your body uses food (metabolism). When your thyroid is overactive, it produces too much of a hormone called thyroxine. What are the causes? Causes of hyperthyroidism may include:  Graves disease. This is when your immune system attacks the thyroid gland. This is the most common cause.  Inflammation of the thyroid gland.  Tumor in the thyroid gland or somewhere else.  Excessive use of thyroid medicines, including:  Prescription thyroid supplement.  Herbal supplements that mimic thyroid hormones.  Solid or fluid-filled lumps within your thyroid gland (thyroid nodules).  Excessive ingestion of iodine. What increases the risk?  Being female.  Having a family history of thyroid conditions. What are the signs or symptoms? Signs and symptoms of hyperthyroidism may include:  Nervousness.  Inability to tolerate heat.  Unexplained weight loss.  Diarrhea.  Change in the texture of hair or skin.  Heart skipping beats or making extra beats.  Rapid heart rate.  Loss of menstruation.  Shaky hands.  Fatigue.  Restlessness.  Increased appetite.  Sleep problems.  Enlarged thyroid gland or nodules. How is this diagnosed? Diagnosis of hyperthyroidism may include:  Medical history and physical exam.  Blood tests.  Ultrasound tests. How is this treated? Treatment may include:  Medicines to control your thyroid.  Surgery to remove your thyroid.  Radiation therapy. Follow these instructions at home:  Take  medicines only as directed by your health care provider.  Do not use any tobacco products, including cigarettes, chewing tobacco, or electronic cigarettes. If you need help quitting, ask your health care provider.  Do not exercise or do physical activity until your health care provider approves.  Keep all follow-up appointments as directed by your health care provider. This is important. Contact a health care provider if:  Your symptoms do not get better with treatment.  You have fever.  You are taking thyroid replacement medicine and you:  Have depression.  Feel mentally and physically slow.  Have weight gain. Get help right away if:  You have decreased alertness or a change in your awareness.  You have abdominal pain.  You feel dizzy.  You have a rapid heartbeat.  You have an irregular heartbeat. This information is not intended to replace advice given to you by your health care provider. Make sure you discuss any questions you have with your health care provider. Document Released: 10/05/2005 Document Revised: 03/05/2016 Document Reviewed: 02/20/2014 Elsevier Interactive Patient Education  2017 [employer].

## 2016-11-24 ENCOUNTER — Ambulatory Visit (INDEPENDENT_AMBULATORY_CARE_PROVIDER_SITE_OTHER): Payer: Medicare Other | Admitting: Physician Assistant

## 2016-11-24 ENCOUNTER — Encounter: Payer: Self-pay | Admitting: Physician Assistant

## 2016-11-24 ENCOUNTER — Telehealth: Payer: Self-pay | Admitting: Medical

## 2016-11-24 VITALS — BP 158/90 | HR 123 | Ht 63.5 in | Wt 264.6 lb

## 2016-11-24 DIAGNOSIS — I5022 Chronic systolic (congestive) heart failure: Secondary | ICD-10-CM

## 2016-11-24 DIAGNOSIS — I481 Persistent atrial fibrillation: Secondary | ICD-10-CM | POA: Diagnosis not present

## 2016-11-24 DIAGNOSIS — I4819 Other persistent atrial fibrillation: Secondary | ICD-10-CM

## 2016-11-24 DIAGNOSIS — E0590 Thyrotoxicosis, unspecified without thyrotoxic crisis or storm: Secondary | ICD-10-CM | POA: Insufficient documentation

## 2016-11-24 DIAGNOSIS — I429 Cardiomyopathy, unspecified: Secondary | ICD-10-CM

## 2016-11-24 MED ORDER — CARVEDILOL 25 MG PO TABS
25.0000 mg | ORAL_TABLET | Freq: Two times a day (BID) | ORAL | 11 refills | Status: DC
Start: 2016-11-24 — End: 2017-11-26

## 2016-11-24 NOTE — Patient Instructions (Signed)
Medication Instructions:  1) INCREASE COREG (Carvedilol) to 25 mg twice daily  Labwork: None  Testing/Procedures: None  Follow-Up: You have an appointment with Dr. [name] scheduled 12/03/16 at 1:30 PM.  Any Other Special Instructions Will Be Listed Below (If Applicable).     If you need a refill on your cardiac medications before your next appointment, please call your pharmacy.

## 2016-11-24 NOTE — Progress Notes (Signed)
Cardiology Office Note    Date:  11/24/2016   ID:  Stephanie [name], DOB 1951-08-08, MRN [id number]  PCP:  [name], PA-C  Cardiologist: Dr. [name]  No chief complaint on file.   History of Present Illness:  Stephanie [name] is a 66 y.o. female with a h/o atrial fibrillation that had been treated with amiodarone that was stopped by neurology due to tremor and unsteady gait over a year ago. Patient was hospitalized twice last month with atrial fib with RVR and fluid overload/CHF secondary to new onset hyperthyroidism. She was started on Tapazole. 2-D echo 10/18/16 showed severely reduced LVEF 25% with diffuse hypokinesis and moderate concentric LVH. Left atrium was severely dilated. This is new since 11/2015 at which time her LVEF was 50-55%.  She saw Roderic Palau back in the A. fib clinic 11/19/16 and her carvedilol was increased to 25 mg in morning 12.5 in the afternoon. She is here for follow-up to make sure her rate is controlled. Plan is to cardiovert her once her thyroid is controlled.  Patient comes in today feeling poorly. She gets short of breath with very little activity. Her heart rate is still 123 bpm atrial flutter. Her heart failure seems to be compensated.     Past Medical History:  Diagnosis Date  . Anemia   . Ankle fracture, right    x 2  . Antineoplastic and immunosuppressive drugs causing adverse effect in therapeutic use   . Asthma   . Atrial fibrillation ([place])   . Chronic venous hypertension without complications   . Colon polyps   . Colon polyps 2009  . Coronary artery disease    no CAD by cath 11.2010  . Depressive disorder, not elsewhere classified   . Diastolic CHF, chronic ([place])   . Diverticulosis of colon 2009   colonoscopy  . Dyslipidemia   . Glucose intolerance (impaired glucose tolerance)    hgb AIC 6.1 09/02/2009  . Gout   . Hepatitis, unspecified    hx of  . Hx of hysterectomy 2002  . Hyperlipidemia   . Hypertension   . Irritable  bowel syndrome   . Lung nodule    22mm RLL nodule on CT Chest  07/2009, resolved by 2011 CT  . Noncompliance   . Obesity   . Persistent atrial fibrillation ([place])    s/p DCCV 07/2009; Pradaxa Rx  . PONV (postoperative nausea and vomiting)   . Sarcoidosis ([place])    dx by eye exam  . Sleep disturbance 06/2013   sleep study, mild abnormality, no criteria for CPAP    Past Surgical History:  Procedure Laterality Date  . ABDOMINAL HYSTERECTOMY    . CARDIOVERSION     x 2  . CARDIOVERSION N/A 02/27/2013   Procedure: CARDIOVERSION;  Surgeon: Lelon Perla, MD;  Location: [facility] ENDOSCOPY;  Service: Cardiovascular;  Laterality: N/A;  . CHOLECYSTECTOMY    . COLONOSCOPY     Diverticulosis, colon polyps, repeat 2014, Dr. Deatra Ina  . ESOPHAGOGASTRODUODENOSCOPY N/A 05/16/2014   Procedure: ESOPHAGOGASTRODUODENOSCOPY (EGD);  Surgeon: Wonda Horner, MD;  Location: [facility] ORS;  Service: Gastroenterology;  Laterality: N/A;  . HERNIA REPAIR  2009    Current Medications: Outpatient Medications Prior to Visit  Medication Sig Dispense Refill  . allopurinol (ZYLOPRIM) 300 MG tablet TAKE 1 TABLET BY MOUTH EVERY DAY (Patient taking differently: TAKE 300 MG BY MOUTH DAILY AS NEEDED FOR GOUT) 90 tablet 2  . ALPRAZolam (XANAX) 1 MG tablet Take 1 tablet (1 mg  total) by mouth at bedtime as needed for anxiety. 45 tablet 1  . colchicine (COLCRYS) 0.6 MG tablet Take 1 tablet (0.6 mg total) by mouth daily as needed. Reported on 01/21/2016 (Patient taking differently: Take 0.6 mg by mouth daily as needed (GOUT). Reported on 01/21/2016) 45 tablet 1  . dabigatran (PRADAXA) 150 MG CAPS capsule Take 1 capsule (150 mg total) by mouth 2 (two) times daily. 60 capsule 11  . diphenoxylate-atropine (LOMOTIL) 2.5-0.025 MG tablet Take 1 tablet by mouth every 8 (eight) hours as needed for diarrhea or loose stools. 30 tablet 0  . furosemide (LASIX) 40 MG tablet Take 1 tablet (40 mg total) by mouth daily. 30 tablet 0  . methimazole (TAPAZOLE) 5  MG tablet TAKE 2 TABLETS BY MOUTH EVERY MORNING AND 1 TABLET EVERY NIGHT AT BEDTIME 270 tablet 1  . omeprazole (PRILOSEC) 20 MG capsule Take 1 capsule (20 mg total) by mouth daily. 30 capsule 1  . potassium chloride SA (K-DUR,KLOR-CON) 20 MEQ tablet Take 1 tablet (20 mEq total) by mouth daily. 30 tablet 0  . rosuvastatin (CRESTOR) 20 MG tablet Take 1 tablet (20 mg total) by mouth daily. 90 tablet 3  . traZODone (DESYREL) 50 MG tablet TAKE 1/2 TO 1 TABLET BY MOUTH EVERY NIGHT AT BEDTIME AS NEEDED FOR SLEEP 90 tablet 3  . VENTOLIN HFA 108 (90 Base) MCG/ACT inhaler INHALE 2 PUFFS INTO THE LUNGS EVERY 6 HOURS AS NEEDED FOR WHEEZING OR SHORTNESS OF BREATH 18 g 3  . carvedilol (COREG) 12.5 MG tablet Take 2 tablets (25mg ) in the morning and 1 tablet (12.5mg ) in the evening 90 tablet 3  . losartan (COZAAR) 25 MG tablet Take 1 tablet (25 mg total) by mouth daily. 90 tablet 3   No facility-administered medications prior to visit.      Allergies:   Albuterol; Amiodarone; Atorvastatin; Livalo [pitavastatin]; and Propoxyphene n-acetaminophen   Social History   Social History  . Marital status: Single    Spouse name: N/A  . Number of children: 5  . Years of education: college   Occupational History  . Disabled.    Social History Main Topics  . Smoking status: Never Smoker  . Smokeless tobacco: Never Used  . Alcohol use 0.0 oz/week     Comment: drinks 1 glass of wine every 2 weeks on average (occasional)  . Drug use: No  . Sexual activity: Not Currently    Birth control/ protection: Surgical   Other Topics Concern  . None   Social History Narrative   Pt lives in [place] with her son.  Unemployed.  Right handed.     Education college        Family History:  The patient's family history includes Cancer in her father; Emphysema in her other; Hypertension in her father; Multiple sclerosis in her mother; Pneumonia in her father.   ROS:   Please see the history of present illness.      Review of Systems  Constitution: Positive for weakness, malaise/fatigue and weight gain.  HENT: Negative.   Eyes: Negative.   Cardiovascular: Positive for dyspnea on exertion, leg swelling and palpitations.  Respiratory: Negative.   Hematologic/Lymphatic: Negative.   Musculoskeletal: Negative.  Negative for joint pain.  Gastrointestinal: Negative.   Genitourinary: Negative.    All other systems reviewed and are negative.   PHYSICAL EXAM:   VS:  BP (!) 158/90   Pulse (!) 123   Ht 5' 3.5" (1.613 m)   Wt 264 lb 9.6  oz (120 kg)   SpO2 99%   BMI 46.14 kg/m   Physical Exam  GEN: Obese, in no acute distress  Neck: no JVD, carotid bruits, or masses Cardiac:irreg irreg; no murmurs, rubs, or gallops  Respiratory:  clear to auscultation bilaterally, normal work of breathing GI: soft, nontender, nondistended, + BS Ext: without cyanosis, clubbing, or edema, Good distal pulses bilaterally Psych: euthymic mood, full affect  Wt Readings from Last 3 Encounters:  11/24/16 264 lb 9.6 oz (120 kg)  11/20/16 266 lb (120.7 kg)  11/19/16 266 lb 3.2 oz (120.7 kg)      Studies/Labs Reviewed:   EKG:  EKG is  ordered today.  The ekg ordered today demonstrates A flutter at 123 bpm nonspecific ST-T wave changes  Recent Labs: 11/11/2016: ALT 19; B Natriuretic Peptide 1,473.6 11/12/2016: TSH <0.010 11/13/2016: Magnesium 1.7 11/14/2016: Hemoglobin 10.2; Platelets 196 11/18/2016: BUN 18; Creat 1.02; Potassium 4.0; Sodium 142   Lipid Panel    Component Value Date/Time   CHOL 221 (H) 08/20/2016 1104   TRIG 103 08/20/2016 1104   HDL 60 08/20/2016 1104   CHOLHDL 3.7 08/20/2016 1104   VLDL 21 08/20/2016 1104   LDLCALC 140 (H) 08/20/2016 1104    Additional studies/ records that were reviewed today include:   2-D echo 09/2016 Study Conclusions   - Left ventricle: The cavity size was mildly dilated. There was   moderate concentric hypertrophy. Systolic function was severely   reduced. The  estimated ejection fraction was 25%. Diffuse   hypokinesis. The study is not technically sufficient to allow   evaluation of LV diastolic function. - Mitral valve: Calcified annulus. Mildly thickened leaflets .   There was mild regurgitation. - Left atrium: The atrium was severely dilated. - Right ventricle: Systolic function was moderately reduced. - Right atrium: The atrium was mildly dilated. - Tricuspid valve: There was mild regurgitation.     ASSESSMENT:    1. Persistent atrial fibrillation (HCC)   2. Cardiomyopathy, secondary ([place])   3. Chronic systolic CHF (congestive heart failure) ([place])   4. Thyrotoxicosis without thyroid storm, unspecified thyrotoxicosis type      PLAN:  In order of problems listed above:  Persistent atrial fib/flutter with RVR secondary to thyrotoxicosis. Increase Coreg to 25 mg twice a day. We'll hold off on adding diltiazem because of new LV dysfunction. Follow-up with Dr. [name] next week. On Pradaxa  Cardiomyopathy LVEF now 25% was likely secondary to thyrotoxicosis and afib with RVR. Hopefully will recover.  Chronic systolic heart failure currently compensated on low-dose Lasix  Thyrotoxicosis on tapazole    Medication Adjustments/Labs and Tests Ordered: Current medicines are reviewed at length with the patient today.  Concerns regarding medicines are outlined above.  Medication changes, Labs and Tests ordered today are listed in the Patient Instructions below. Patient Instructions  Medication Instructions:  1) INCREASE COREG (Carvedilol) to 25 mg twice daily  Labwork: None  Testing/Procedures: None  Follow-Up: You have an appointment with Dr. [name] scheduled 12/03/16 at 1:30 PM.  Any Other Special Instructions Will Be Listed Below (If Applicable).     If you need a refill on your cardiac medications before your next appointment, please call your pharmacy.      Signed, Ermalinda Barrios, PA-C  11/24/2016 10:45 AM    [facility] [address], [place],   [address] Phone: [phone]; Fax: [phone]

## 2016-11-24 NOTE — Telephone Encounter (Signed)
Called pt to verify medications acor riding to her all that is on her medication list is right, had a very hard time understanding her, pt can be reached at [phone]

## 2016-11-25 ENCOUNTER — Encounter: Payer: Medicare Other | Admitting: Medical

## 2016-12-03 ENCOUNTER — Ambulatory Visit (INDEPENDENT_AMBULATORY_CARE_PROVIDER_SITE_OTHER): Payer: Medicare Other | Admitting: Internal Medicine

## 2016-12-03 ENCOUNTER — Encounter: Payer: Self-pay | Admitting: Internal Medicine

## 2016-12-03 VITALS — BP 132/90 | HR 69 | Ht 63.0 in | Wt 259.8 lb

## 2016-12-03 DIAGNOSIS — I480 Paroxysmal atrial fibrillation: Secondary | ICD-10-CM

## 2016-12-03 MED ORDER — METOPROLOL SUCCINATE ER 25 MG PO TB24: 25.0000 mg | ORAL_TABLET | Freq: Every day | ORAL | 3 refills | Status: DC

## 2016-12-03 NOTE — Patient Instructions (Addendum)
Medication Instructions:  Your physician has recommended you make the following change in your medication:  1) START Toprol XL 25 mg daily    Labwork: TODAY: T3, Free / T4, Free / TSH   Testing/Procedures: None Ordered   Follow-Up: Your physician recommends that you schedule a follow-up appointment in: 6 weeks with Dr. [name] (forward to [facility] if no scheduling availability). Cancel appointment with Roderic Palau, NP     Any Other Special Instructions Will Be Listed Below (If Applicable). Patient on 2 beta blockers    If you need a refill on your cardiac medications before your next appointment, please call your pharmacy.

## 2016-12-03 NOTE — Progress Notes (Signed)
Patient Care Team: Carlena Hurl, PA-C as PCP - General (Family Medicine)   HPI  Stephanie [name] is a 66 y.o. female Seen in followup for atrial fibrillation in the context of morbid obesity, sarcoid and mild left ventricular hypertrophy. Catheterization 2012 demonstrated nonobstructive coronary disease and normal left ventricular function and elevated filling pressures By echocardiogram, recurrent cardiomyopathy was demonstrated with EF 25-30% April 2014   She underwent cardioversion may/14 with the hopes of restoring sinus rhythm and seeing recovery of LV systolic function. She had rapidly reverted to afib and then spontaneously reverted to sinus rhythm. There has been  interval near normalization of LV systolic function confirmed by echo August 2014   She saw Dr. Greggory Brandy 3/15 to consider catheter ablation.  She was not interested. He reduced her amiodarone to 200 mg a day. Encouraged her to refrain from alcohol and to work on blood pressure and weight reduction.  She has been seeing neurology because of complaints of tremor and staggering.  The concern was whether he could be related to amiodarone  Toxicity and it was discontinued    She has had 2 recent hospitalizations for congestive heart failure associated with hyperthyroidism and atrial fibrillation with a rapid rate. She is currently on methimazole therapy. She was seen last week with persistently rapid rates.  LVEF was noted to be 25-30%.   .  6/17 TSH 1.9 ALT 9 1/18 TSH <<0.01   .   Past Medical History:  Diagnosis Date  . Anemia   . Ankle fracture, right    x 2  . Antineoplastic and immunosuppressive drugs causing adverse effect in therapeutic use   . Asthma   . Atrial fibrillation ([place])   . Chronic venous hypertension without complications   . Colon polyps   . Colon polyps 2009  . Coronary artery disease    no CAD by cath 11.2010  . Depressive disorder, not elsewhere classified   . Diastolic CHF, chronic  ([place])   . Diverticulosis of colon 2009   colonoscopy  . Dyslipidemia   . Glucose intolerance (impaired glucose tolerance)    hgb AIC 6.1 09/02/2009  . Gout   . Hepatitis, unspecified    hx of  . Hx of hysterectomy 2002  . Hyperlipidemia   . Hypertension   . Irritable bowel syndrome   . Lung nodule    50mm RLL nodule on CT Chest  07/2009, resolved by 2011 CT  . Noncompliance   . Obesity   . Persistent atrial fibrillation ([place])    s/p DCCV 07/2009; Pradaxa Rx  . PONV (postoperative nausea and vomiting)   . Sarcoidosis ([place])    dx by eye exam  . Sleep disturbance 06/2013   sleep study, mild abnormality, no criteria for CPAP    Past Surgical History:  Procedure Laterality Date  . ABDOMINAL HYSTERECTOMY    . CARDIOVERSION     x 2  . CARDIOVERSION N/A 02/27/2013   Procedure: CARDIOVERSION;  Surgeon: Lelon Perla, MD;  Location: [facility] ENDOSCOPY;  Service: Cardiovascular;  Laterality: N/A;  . CHOLECYSTECTOMY    . COLONOSCOPY     Diverticulosis, colon polyps, repeat 2014, Dr. Deatra Ina  . ESOPHAGOGASTRODUODENOSCOPY N/A 05/16/2014   Procedure: ESOPHAGOGASTRODUODENOSCOPY (EGD);  Surgeon: Wonda Horner, MD;  Location: [facility] ORS;  Service: Gastroenterology;  Laterality: N/A;  . HERNIA REPAIR  2009    Current Outpatient Prescriptions  Medication Sig Dispense Refill  . allopurinol (ZYLOPRIM) 300 MG tablet TAKE 1 TABLET  BY MOUTH EVERY DAY (Patient taking differently: TAKE 300 MG BY MOUTH DAILY AS NEEDED FOR GOUT) 90 tablet 2  . ALPRAZolam (XANAX) 1 MG tablet Take 1 tablet (1 mg total) by mouth at bedtime as needed for anxiety. 45 tablet 1  . carvedilol (COREG) 25 MG tablet Take 1 tablet (25 mg total) by mouth 2 (two) times daily. 60 tablet 11  . colchicine (COLCRYS) 0.6 MG tablet Take 1 tablet (0.6 mg total) by mouth daily as needed. Reported on 01/21/2016 (Patient taking differently: Take 0.6 mg by mouth daily as needed (GOUT). Reported on 01/21/2016) 45 tablet 1  . dabigatran (PRADAXA) 150 MG  CAPS capsule Take 1 capsule (150 mg total) by mouth 2 (two) times daily. 60 capsule 11  . diphenoxylate-atropine (LOMOTIL) 2.5-0.025 MG tablet Take 1 tablet by mouth every 8 (eight) hours as needed for diarrhea or loose stools. 30 tablet 0  . furosemide (LASIX) 40 MG tablet Take 1 tablet (40 mg total) by mouth daily. 30 tablet 0  . methimazole (TAPAZOLE) 5 MG tablet TAKE 2 TABLETS BY MOUTH EVERY MORNING AND 1 TABLET EVERY NIGHT AT BEDTIME 270 tablet 1  . omeprazole (PRILOSEC) 20 MG capsule Take 1 capsule (20 mg total) by mouth daily. 30 capsule 1  . potassium chloride SA (K-DUR,KLOR-CON) 20 MEQ tablet Take 1 tablet (20 mEq total) by mouth daily. 30 tablet 0  . rosuvastatin (CRESTOR) 20 MG tablet Take 1 tablet (20 mg total) by mouth daily. 90 tablet 3  . traZODone (DESYREL) 50 MG tablet TAKE 1/2 TO 1 TABLET BY MOUTH EVERY NIGHT AT BEDTIME AS NEEDED FOR SLEEP 90 tablet 3  . VENTOLIN HFA 108 (90 Base) MCG/ACT inhaler INHALE 2 PUFFS INTO THE LUNGS EVERY 6 HOURS AS NEEDED FOR WHEEZING OR SHORTNESS OF BREATH 18 g 3  . losartan (COZAAR) 25 MG tablet Take 1 tablet (25 mg total) by mouth daily. 90 tablet 3   No current facility-administered medications for this visit.     Allergies  Allergen Reactions  . Albuterol     Palpitations, intolerance  . Amiodarone     adverse reaction: Tremor/gait disurbance  . Atorvastatin     Body aches and pains--stiffness.   [name] [Pitavastatin]     Body aches  . Propoxyphene N-Acetaminophen Nausea And Vomiting    Review of Systems negative except from HPI and PMH  Physical Exam BP 132/90   Pulse 69   Ht 5\' 3"  (1.6 m)   Wt 259 lb 12.8 oz (117.8 kg)   SpO2 97%   BMI 46.02 kg/m  Well developed and well nourished in no acute distress HENT normal E scleral and icterus clear Neck Supple JVP flat; carotids brisk and full Clear to ausculation Irregular rate and rhythm, no murmurs gallops or rub Soft with active bowel sounds No clubbing cyanosis none  Edema Alert and oriented, grossly normal motor and sensory function Skin Warm and Dry  ECG: Atrial fibrillation at 95 Intervals-/11/41 Axis left -33       Assessment and  Plan  Hypertension  better    atrial fibrillation-persistent    Cardio myopathy non ischemic   Congestive heart failure  Class 3  Sarcoid  Obesity    Tremor  High risk medication surveillance  Continue on dabigitran  No bleeding issues  She is currently on methimazole; she remains hyperthyroid. At the time she becomes euthyroid cardioversion is appropriate.  In the interim I think augmented rate control would be helpful. I would  add metoprolol succinate 25 mg to her twice a day carvedilol.  Discussed with Dr Cruzita Lederer   We will draw thyroids and forward them to her    We will see her in about 6 weeks

## 2016-12-04 LAB — T3, FREE: T3, Free: 2.8 pg/mL (ref 2.0–4.4)

## 2016-12-04 LAB — T4, FREE: Free T4: 1.4 ng/dL (ref 0.82–1.77)

## 2016-12-04 LAB — TSH: TSH: 0.014 u[IU]/mL — AB (ref 0.450–4.500)

## 2016-12-14 ENCOUNTER — Ambulatory Visit: Payer: Medicare Other | Admitting: Internal Medicine

## 2016-12-14 ENCOUNTER — Telehealth: Payer: Self-pay

## 2016-12-14 ENCOUNTER — Other Ambulatory Visit: Payer: Self-pay | Admitting: Internal Medicine

## 2016-12-14 DIAGNOSIS — E0590 Thyrotoxicosis, unspecified without thyrotoxic crisis or storm: Secondary | ICD-10-CM

## 2016-12-14 NOTE — Telephone Encounter (Signed)
Called patient to discuss lab results. Scheduled patient to come in for other labs in one week, labs are placed. No questions at this time.

## 2016-12-15 ENCOUNTER — Other Ambulatory Visit: Payer: Self-pay | Admitting: Medical

## 2016-12-17 ENCOUNTER — Ambulatory Visit (HOSPITAL_COMMUNITY): Payer: Medicare Other | Admitting: Nurse Practitioner

## 2016-12-18 ENCOUNTER — Other Ambulatory Visit: Payer: Medicare Other

## 2016-12-20 ENCOUNTER — Telehealth: Payer: Self-pay | Admitting: Medical

## 2016-12-20 NOTE — Telephone Encounter (Signed)
P.A. ROSUVASTATIN

## 2016-12-21 ENCOUNTER — Telehealth: Payer: Self-pay

## 2016-12-21 ENCOUNTER — Telehealth: Payer: Self-pay | Admitting: Internal Medicine

## 2016-12-21 ENCOUNTER — Other Ambulatory Visit (INDEPENDENT_AMBULATORY_CARE_PROVIDER_SITE_OTHER): Payer: Medicare Other

## 2016-12-21 DIAGNOSIS — E0590 Thyrotoxicosis, unspecified without thyrotoxic crisis or storm: Secondary | ICD-10-CM | POA: Diagnosis not present

## 2016-12-21 LAB — TSH: TSH: 2.23 u[IU]/mL (ref 0.35–4.50)

## 2016-12-21 LAB — T4, FREE: FREE T4: 0.86 ng/dL (ref 0.60–1.60)

## 2016-12-21 LAB — T3, FREE: T3, Free: 3 pg/mL (ref 2.3–4.2)

## 2016-12-21 NOTE — Telephone Encounter (Signed)
I would not suggest to stop it >> was she taking it with meals? If not, start doing so and let us know how it goes. Stomach investigation has to come from PCP.

## 2016-12-21 NOTE — Telephone Encounter (Signed)
Called patient and advised of Dr.Gherghes note. Patient understood.

## 2016-12-21 NOTE — Telephone Encounter (Signed)
Called and advised patient, she states she has been taking with food. I advised to contact PCP.

## 2016-12-21 NOTE — Telephone Encounter (Signed)
Pt is asking if it is normal for the methimazole to burn her stomach? Can she stop taking it?  Her stomach is swelling since the start of the medicine.   Can she have an order for an MRI on her stomach?

## 2016-12-24 ENCOUNTER — Other Ambulatory Visit: Payer: Self-pay | Admitting: Internal Medicine

## 2016-12-24 DIAGNOSIS — E0590 Thyrotoxicosis, unspecified without thyrotoxic crisis or storm: Secondary | ICD-10-CM

## 2016-12-24 LAB — THYROID STIMULATING IMMUNOGLOBULIN: TSI: <89 % baseline (ref <140)

## 2016-12-24 MED ORDER — METHIMAZOLE 5 MG PO TABS: ORAL_TABLET | ORAL | 1 refills | Status: DC

## 2016-12-25 ENCOUNTER — Telehealth: Payer: Self-pay

## 2016-12-25 NOTE — Telephone Encounter (Signed)
-----   Message from Philemon Kingdom, MD sent at 12/24/2016  5:41 PM EST ----- Almyra Free, can you please call pt: Thyroid tests are now normal on methimazole. We can either reduce the dose now to 5 mg twice a day. We will recheck her thyroid tests in April when she comes back to see me.  Her Graves antibodies are not high. In this case, I would suggest to go ahead with a thyroid uptake and scan to establish the etiology of her hyperthyroidism. I will order it to be done at [facility]. Please remind her to stop the methimazole 4 days prior to the uptake and scan and restart methimazole after the scan.

## 2016-12-25 NOTE — Telephone Encounter (Signed)
Called patient and gave lab results. Patient had no questions or concerns. Patient advised of medication changes when doing uptake and scan. Also patient will change the dosage of the medication now. No other questions at this time.

## 2016-12-27 NOTE — Telephone Encounter (Signed)
P.A. Approved til 10/18/17, pt informed, she states she does not need refill at this time

## 2017-01-09 ENCOUNTER — Other Ambulatory Visit: Payer: Self-pay | Admitting: Medical

## 2017-01-11 ENCOUNTER — Encounter (HOSPITAL_COMMUNITY): Admission: RE | Admit: 2017-01-11 | Payer: Medicare Other | Source: Ambulatory Visit

## 2017-01-11 ENCOUNTER — Encounter (HOSPITAL_COMMUNITY): Payer: Self-pay

## 2017-01-12 ENCOUNTER — Encounter (HOSPITAL_COMMUNITY): Payer: Medicare Other

## 2017-01-14 ENCOUNTER — Encounter (HOSPITAL_COMMUNITY)
Admission: RE | Admit: 2017-01-14 | Discharge: 2017-01-14 | Disposition: A | Discharge status: Home / Self Care | Payer: Medicare Other | Source: Ambulatory Visit | Attending: Internal Medicine | Admitting: Internal Medicine

## 2017-01-14 DIAGNOSIS — E0590 Thyrotoxicosis, unspecified without thyrotoxic crisis or storm: Secondary | ICD-10-CM | POA: Insufficient documentation

## 2017-01-14 MED ORDER — SODIUM IODIDE I 131 CAPSULE
11.3000 | Freq: Once | INTRAVENOUS | Status: AC | PRN
  Administered 2017-01-14: 11.3 via ORAL

## 2017-01-15 ENCOUNTER — Encounter: Payer: Self-pay | Admitting: Internal Medicine

## 2017-01-15 ENCOUNTER — Ambulatory Visit (INDEPENDENT_AMBULATORY_CARE_PROVIDER_SITE_OTHER): Payer: Medicare Other | Admitting: Internal Medicine

## 2017-01-15 ENCOUNTER — Encounter (HOSPITAL_COMMUNITY)
Admission: RE | Admit: 2017-01-15 | Discharge: 2017-01-15 | Disposition: A | Discharge status: Home / Self Care | Payer: Medicare Other | Source: Ambulatory Visit | Attending: Internal Medicine | Admitting: Internal Medicine

## 2017-01-15 VITALS — BP 130/100 | HR 87 | Ht 63.0 in | Wt 263.0 lb

## 2017-01-15 DIAGNOSIS — E0590 Thyrotoxicosis, unspecified without thyrotoxic crisis or storm: Secondary | ICD-10-CM | POA: Diagnosis not present

## 2017-01-15 DIAGNOSIS — I481 Persistent atrial fibrillation: Secondary | ICD-10-CM

## 2017-01-15 DIAGNOSIS — I428 Other cardiomyopathies: Secondary | ICD-10-CM | POA: Diagnosis not present

## 2017-01-15 DIAGNOSIS — I4819 Other persistent atrial fibrillation: Secondary | ICD-10-CM

## 2017-01-15 DIAGNOSIS — I5022 Chronic systolic (congestive) heart failure: Secondary | ICD-10-CM | POA: Diagnosis not present

## 2017-01-15 MED ORDER — SODIUM PERTECHNETATE TC 99M INJECTION: 10.0000 | Freq: Once | INTRAVENOUS | Status: DC | PRN

## 2017-01-15 MED ORDER — METOPROLOL SUCCINATE ER 25 MG PO TB24: 50.0000 mg | ORAL_TABLET | Freq: Every day | ORAL | Status: DC

## 2017-01-15 NOTE — Patient Instructions (Addendum)
Medication Instructions: - Your physician has recommended you make the following change in your medication:  1) Increase metoprolol succinate 25 mg- take 2 tablets (50 mg) by mouth ONCE daily 2) Restart carvedilol 25 mg- take 1 tablet by mouth TWICE daily 3) Resume pradaxa 150 mg- take 1 capsule by mouth TWICE daily 4) Increase lasix (furosemide) 40 mg- take 1 tablet by mouth TWICE daily x 7 days, then resume 40 mg once daily  Labwork: - none ordered  Procedures/Testing: - Your physician has recommended that you have a Cardioversion (DCCV)- in 4 weeks (Dr. Olin Pia nurse, Nira Conn, will call you next week with a day). Electrical Cardioversion uses a jolt of electricity to your heart either through paddles or wired patches attached to your chest. This is a controlled, usually prescheduled, procedure. Defibrillation is done under light anesthesia in the hospital, and you usually go home the day of the procedure. This is done to get your heart back into a normal rhythm. You are not awake for the procedure. Please see the instruction sheet given to you today.  Follow-Up: - Your physician recommends that you schedule a follow-up appointment in: 3 weeks with Dickie La, NP for Dr. [name].  Any Additional Special Instructions Will Be Listed Below (If Applicable).     If you need a refill on your cardiac medications before your next appointment, please call your pharmacy.

## 2017-01-15 NOTE — Progress Notes (Signed)
Patient Care Team: Carlena Hurl, PA-C as PCP - General (Family Medicine)   HPI  Stephanie [name] is a 66 y.o. female Seen in followup for atrial fibrillation in the context of morbid obesity, sarcoid and mild left ventricular hypertrophy. Catheterization 2012 demonstrated nonobstructive coronary disease and normal left ventricular function and elevated filling pressures By echocardiogram, recurrent cardiomyopathy was demonstrated with EF 25-30% April 2014   She underwent cardioversion may/14 with the hopes of restoring sinus rhythm and seeing recovery of LV systolic function. She had rapidly reverted to afib and then spontaneously reverted to sinus rhythm. There has been  interval near normalization of LV systolic function confirmed by echo August 2014   She saw Dr. Greggory Brandy 3/15 to consider catheter ablation.  She was not interested. He reduced her amiodarone to 200 mg a day. Encouraged her to refrain from alcohol and to work on blood pressure and weight reduction.    She developed hyperthyroidism on amiodarone prompting his discontinuation. Blood work was checked couple of weeks ago and she was euthyroid. The recommendation at that point was to decrease her methimazole to 5 mg twice daily per endocrine. Follow-up with endocrine is scheduled for next week  She was in the emergency room earlier this week because of dizziness and confusion. She's been having ongoing problems with GI discomfort related to her methimazole. Because of this she ended of decreasing her Pradaxa to daily. With her confusion she ended up off of her beta blockers and her diuretics.  She is better off of methimazole Complains of fatigue and dyspnea of fatigue      12/17 LVEF  25-30%.   .  6/17 TSH 1.9 ALT 9 1/18 TSH <<0.01 3/18 TSH 2.2    .   Past Medical History:  Diagnosis Date  . Anemia   . Ankle fracture, right    x 2  . Antineoplastic and immunosuppressive drugs causing adverse effect in  therapeutic use   . Asthma   . Atrial fibrillation ([place])   . Chronic venous hypertension without complications   . Colon polyps   . Colon polyps 2009  . Coronary artery disease    no CAD by cath 11.2010  . Depressive disorder, not elsewhere classified   . Diastolic CHF, chronic ([place])   . Diverticulosis of colon 2009   colonoscopy  . Dyslipidemia   . Glucose intolerance (impaired glucose tolerance)    hgb AIC 6.1 09/02/2009  . Gout   . Hepatitis, unspecified    hx of  . Hx of hysterectomy 2002  . Hyperlipidemia   . Hypertension   . Irritable bowel syndrome   . Lung nodule    65mm RLL nodule on CT Chest  07/2009, resolved by 2011 CT  . Noncompliance   . Obesity   . Persistent atrial fibrillation ([place])    s/p DCCV 07/2009; Pradaxa Rx  . PONV (postoperative nausea and vomiting)   . Sarcoidosis ([place])    dx by eye exam  . Sleep disturbance 06/2013   sleep study, mild abnormality, no criteria for CPAP    Past Surgical History:  Procedure Laterality Date  . ABDOMINAL HYSTERECTOMY    . CARDIOVERSION     x 2  . CARDIOVERSION N/A 02/27/2013   Procedure: CARDIOVERSION;  Surgeon: Lelon Perla, MD;  Location: [facility] ENDOSCOPY;  Service: Cardiovascular;  Laterality: N/A;  . CHOLECYSTECTOMY    . COLONOSCOPY     Diverticulosis, colon polyps, repeat 2014, Dr. Deatra Ina  .  ESOPHAGOGASTRODUODENOSCOPY N/A 05/16/2014   Procedure: ESOPHAGOGASTRODUODENOSCOPY (EGD);  Surgeon: Wonda Horner, MD;  Location: [facility] ORS;  Service: Gastroenterology;  Laterality: N/A;  . HERNIA REPAIR  2009    Current Outpatient Prescriptions  Medication Sig Dispense Refill  . allopurinol (ZYLOPRIM) 300 MG tablet TAKE 1 TABLET BY MOUTH EVERY DAY (Patient taking differently: TAKE 300 MG BY MOUTH DAILY AS NEEDED FOR GOUT) 90 tablet 2  . ALPRAZolam (XANAX) 1 MG tablet Take 1 tablet (1 mg total) by mouth at bedtime as needed for anxiety. 45 tablet 1  . carvedilol (COREG) 25 MG tablet Take 1 tablet (25 mg total) by mouth 2  (two) times daily. 60 tablet 11  . colchicine (COLCRYS) 0.6 MG tablet Take 1 tablet (0.6 mg total) by mouth daily as needed. Reported on 01/21/2016 (Patient taking differently: Take 0.6 mg by mouth daily as needed (GOUT). Reported on 01/21/2016) 45 tablet 1  . diphenoxylate-atropine (LOMOTIL) 2.5-0.025 MG tablet Take 1 tablet by mouth every 8 (eight) hours as needed for diarrhea or loose stools. 30 tablet 0  . furosemide (LASIX) 40 MG tablet Take 1 tablet (40 mg total) by mouth daily. 30 tablet 0  . furosemide (LASIX) 40 MG tablet TAKE 1 TABLET(40 MG) BY MOUTH DAILY AS NEEDED FOR FLUID RETENTION OR SWELLING 30 tablet 0  . metoprolol succinate (TOPROL XL) 25 MG 24 hr tablet Take 1 tablet (25 mg total) by mouth daily. 90 tablet 3  . omeprazole (PRILOSEC) 20 MG capsule Take 1 capsule (20 mg total) by mouth daily. 30 capsule 1  . potassium chloride SA (K-DUR,KLOR-CON) 20 MEQ tablet Take 1 tablet (20 mEq total) by mouth daily. 30 tablet 0  . PRADAXA 150 MG CAPS capsule TAKE ONE CAPSULE BY MOUTH TWICE DAILY 60 capsule 5  . rosuvastatin (CRESTOR) 20 MG tablet Take 1 tablet (20 mg total) by mouth daily. 90 tablet 3  . traZODone (DESYREL) 50 MG tablet TAKE 1/2 TO 1 TABLET BY MOUTH EVERY NIGHT AT BEDTIME AS NEEDED FOR SLEEP 90 tablet 3  . VENTOLIN HFA 108 (90 Base) MCG/ACT inhaler INHALE 2 PUFFS INTO THE LUNGS EVERY 6 HOURS AS NEEDED FOR WHEEZING OR SHORTNESS OF BREATH 18 g 0  . allopurinol (ZYLOPRIM) 300 MG tablet TAKE 1 TABLET BY MOUTH EVERY DAY (Patient taking differently: TAKE 300 MG BY MOUTH DAILY AS NEEDED FOR GOUT) 90 tablet 2  . carvedilol (COREG) 25 MG tablet Take 1 tablet (25 mg total) by mouth 2 (two) times daily. 60 tablet 11  . colchicine (COLCRYS) 0.6 MG tablet Take 1 tablet (0.6 mg total) by mouth daily as needed. Reported on 01/21/2016 (Patient taking differently: Take 0.6 mg by mouth daily as needed (GOUT). Reported on 01/21/2016) 45 tablet 1  . losartan (COZAAR) 25 MG tablet Take 1 tablet (25 mg  total) by mouth daily. 90 tablet 3  . metoprolol succinate (TOPROL XL) 25 MG 24 hr tablet Take 1 tablet (25 mg total) by mouth daily. 90 tablet 3  . omeprazole (PRILOSEC) 20 MG capsule Take 1 capsule (20 mg total) by mouth daily. 30 capsule 1  . potassium chloride SA (K-DUR,KLOR-CON) 20 MEQ tablet Take 1 tablet (20 mEq total) by mouth daily. 30 tablet 0  . rosuvastatin (CRESTOR) 20 MG tablet Take 1 tablet (20 mg total) by mouth daily. 90 tablet 3  . traZODone (DESYREL) 50 MG tablet TAKE 1/2 TO 1 TABLET BY MOUTH EVERY NIGHT AT BEDTIME AS NEEDED FOR SLEEP 90 tablet 3  . VENTOLIN  HFA 108 (90 Base) MCG/ACT inhaler INHALE 2 PUFFS INTO THE LUNGS EVERY 6 HOURS AS NEEDED FOR WHEEZING OR SHORTNESS OF BREATH 18 g 0   No current facility-administered medications for this visit.    Facility-Administered Medications Ordered in Other Visits  Medication Dose Route Frequency Provider Last Rate Last Dose  . sodium pertechnetate (33mTc04) injection 10 millicurie  10 millicurie Intravenous Once PRN Julian Hy, MD        Allergies  Allergen Reactions  . Albuterol     Palpitations, intolerance  . Amiodarone     adverse reaction: Tremor/gait disurbance  . Atorvastatin     Body aches and pains--stiffness.   [name] [Pitavastatin]     Body aches  . Propoxyphene N-Acetaminophen Nausea And Vomiting    Review of Systems negative except from HPI and PMH  Physical Exam BP (!) 130/100   Pulse 87   Ht 5\' 3"  (1.6 m)   Wt 263 lb (119.3 kg)   SpO2 97%   BMI 46.59 kg/m  Well developed and well nourished in no acute distress HENT normal E scleral and icterus clear Neck Supple JVP flat; carotids brisk and full Clear to ausculation Irregular rate and rhythm, no murmurs gallops or rub Soft with active bowel sounds No clubbing cyanosis 1+Edema Alert and oriented, grossly normal motor and sensory function Skin Warm and Dry  ECG: Atrial fibrillation at 93 Intervals-/10/41 Axis left -28 Some PVC          Assessment and  Plan  Hypertension    atrial fibrillation-persistent    Cardio myopathy non ischemic   Congestive heart failure  Class 3  Sarcoid  Obesity    Tremor  High risk medication surveillance  Continue on dabigitran will resume BID  No bleeding issues  Anticipate DCCV in about 4 weeks   Hopefully she will remain euthryoid-- to see endo next week   Her Afib remians rapid  So will resume her DOUBLE beta blockers and increase metop 25>>50 for now   Will increase her lasix 40 bid x 7d then return to 40 daily  Continue ARB for cardiomyopathy  More than 50% of 45 min was spent in counseling related to the above

## 2017-01-17 ENCOUNTER — Other Ambulatory Visit: Payer: Self-pay | Admitting: Medical

## 2017-01-18 ENCOUNTER — Ambulatory Visit: Payer: Medicare Other | Admitting: Internal Medicine

## 2017-01-18 NOTE — Telephone Encounter (Signed)
Is this okay to refill? 

## 2017-01-19 ENCOUNTER — Telehealth: Payer: Self-pay

## 2017-01-19 ENCOUNTER — Encounter: Payer: Self-pay | Admitting: Internal Medicine

## 2017-01-19 ENCOUNTER — Telehealth: Payer: Self-pay | Admitting: Internal Medicine

## 2017-01-19 ENCOUNTER — Ambulatory Visit (INDEPENDENT_AMBULATORY_CARE_PROVIDER_SITE_OTHER): Payer: Medicare Other | Admitting: Internal Medicine

## 2017-01-19 VITALS — BP 128/82 | HR 99 | Wt 258.0 lb

## 2017-01-19 DIAGNOSIS — E0590 Thyrotoxicosis, unspecified without thyrotoxic crisis or storm: Secondary | ICD-10-CM

## 2017-01-19 NOTE — Telephone Encounter (Signed)
-----   Message from Philemon Kingdom, MD sent at 01/18/2017  5:28 PM EDT ----- Almyra Free, can you please call pt: [name], possibly due to normalization of TFTs. Will continue to follow on MMI for now. I was planning to discuss about this with her today, at the time of her appointment, however, she no showed and I see that she is scheduled at the end of the month.

## 2017-01-19 NOTE — Patient Instructions (Signed)
Please restart MMI 5 mg 2x a day.  Please come back for labs in 5 weeks.  Please return in 4 months.

## 2017-01-19 NOTE — Telephone Encounter (Signed)
Patient no showed today's appt. Please advise on how to follow up. °A. No follow up necessary. °B. Follow up urgent. Contact patient immediately. °C. Follow up necessary. Contact patient and schedule visit in ___ days. °D. Follow up advised. Contact patient and schedule visit in ____weeks. ° °

## 2017-01-19 NOTE — Telephone Encounter (Signed)
Called and LVM advising of results from uptake and scan. Advised no changes now and to keep appointment at the end of the month so she could discuss with her. Left call back number.

## 2017-01-19 NOTE — Progress Notes (Signed)
Patient ID: Stephanie [name], female   DOB: May 31, 1951, 66 y.o.   MRN: [id number]   HPI  Stephanie [name] is a 66 y.o.-year-old female, returning for f/u for thyrotoxicosis and DM2, controlled. Last visit 2 mo ago.  Thyrotoxicosis:  Patient was diagnosed with thyrotoxicosis in 09/2016 when she presented to the hospital in A. fib with RVR.  She tells me that her A fib was dx 2013, but in last month she was admitted with A fib + RVR twice (10/17/2016 and 11/11/2016).  After the last admission, she was started on MMI 10 mg in am and 5 mg in pm this week.  She is on Carvedilol.  She then changed to MMi 10 mg daily >> but stopped ~10 days ago for stomach burning with the higher MMI dose as b/c she heard that MMI is causing a lot of SEs.  I reviewed pt's thyroid tests: Component     Latest Ref Rng & Units 12/21/2016  TSH     0.35 - 4.50 uIU/mL 2.23  Triiodothyronine,Free,Serum     2.3 - 4.2 pg/mL 3.0  T4,Free(Direct)     0.60 - 1.60 ng/dL 0.86  TSI     <140 % baseline <89    Lab Results  Component Value Date   TSH <0.010 (L) 11/12/2016   TSH 0.014 (L) 10/18/2016   TSH 1.913 04/15/2016   TSH 3.253 11/11/2015   TSH 4.690 (H) 08/02/2015   TSH 2.420 08/17/2014   TSH 5.052 (H) 08/14/2014   TSH 1.40 09/28/2013   TSH 0.728  08/02/2009   FREET4 1.75 (H) 11/12/2016   FREET4 1.68 (H) 10/19/2016   FREET4 1.29 08/02/2015   FREET4 1.50 08/17/2014   FREET4 1.08 08/02/2009    01/15/2017: Thyroid Uptake and scan: normal scan and uptake (16%) - but TFTs normal at the time of the check.  Pt denies feeling nodules in neck, hoarseness, dysphagia/odynophagia, SOB with lying down; she c/o: - + fatigue - + excessive sweating/heat intolerance  - + improved tremors - + anxiety - + palpitations - no diarrhea  - no hair loss this fall  Pt does not have a FH of thyroid ds. No FH of thyroid cancer, but mother with MS. No h/o radiation tx to head or neck.  No seaweed or kelp, no recent contrast  studies. No steroid use. No herbal supplements. No Biotin use.  DM2: - stopped Metformin  b/c diarrhea - does not check sugars at home  Lab Results  Component Value Date   HGBA1C 6.5 11/20/2016   ROS: Constitutional: + Please see history of present illness, + poor sleep Eyes: no blurry vision, no xerophthalmia ENT: no sore throat, no nodules palpated in throat, no dysphagia/odynophagia, no hoarseness Cardiovascular: no CP/+ SOB/+ palpitations/+ leg swelling Respiratory: no cough/+ SOB/no wheezing Gastrointestinal: no N/V/D/C/+ heartburn Musculoskeletal: + muscle/+ joint aches Skin: no rashes Neurological: + tremors/no numbness/tingling/dizziness  I reviewed pt's medications, allergies, PMH, social hx, family hx, and changes were documented in the history of present illness. Otherwise, unchanged from my initial visit note.  Past Medical History:  Diagnosis Date  . Anemia   . Ankle fracture, right    x 2  . Antineoplastic and immunosuppressive drugs causing adverse effect in therapeutic use   . Asthma   . Atrial fibrillation ([place])   . Chronic venous hypertension without complications   . Colon polyps   . Colon polyps 2009  . Coronary artery disease    no CAD by cath 11.2010  .  Depressive disorder, not elsewhere classified   . Diastolic CHF, chronic ([place])   . Diverticulosis of colon 2009   colonoscopy  . Dyslipidemia   . Glucose intolerance (impaired glucose tolerance)    hgb AIC 6.1 09/02/2009  . Gout   . Hepatitis, unspecified    hx of  . Hx of hysterectomy 2002  . Hyperlipidemia   . Hypertension   . Irritable bowel syndrome   . Lung nodule    55mm RLL nodule on CT Chest  07/2009, resolved by 2011 CT  . Noncompliance   . Obesity   . Persistent atrial fibrillation ([place])    s/p DCCV 07/2009; Pradaxa Rx  . PONV (postoperative nausea and vomiting)   . Sarcoidosis ([place])    dx by eye exam  . Sleep disturbance 06/2013   sleep study, mild abnormality, no criteria for  CPAP   Past Surgical History:  Procedure Laterality Date  . ABDOMINAL HYSTERECTOMY    . CARDIOVERSION     x 2  . CARDIOVERSION N/A 02/27/2013   Procedure: CARDIOVERSION;  Surgeon: Lelon Perla, MD;  Location[facility] ENDOSCOPY;  Service: Cardiovascular;  Laterality: N/A;  . CHOLECYSTECTOMY    . COLONOSCOPY     Diverticulosis, colon polyps, repeat 2014, Dr. Deatra Ina  . ESOPHAGOGASTRODUODENOSCOPY N/A 05/16/2014   Procedure: ESOPHAGOGASTRODUODENOSCOPY (EGD);  Surgeon: Wonda Horner, MD;  Location: [facility] ORS;  Service: Gastroenterology;  Laterality: N/A;  . HERNIA REPAIR  2009   Social History   Social History  . Marital status: Single    Spouse name: N/A  . Number of children: 5  . Years of education: college   Occupational History  . Disabled.    Social History Main Topics  . Smoking status: Never Smoker  . Smokeless tobacco: Never Used  . Alcohol use 0.0 oz/week     Comment: drinks 1 glass of wine every 2 weeks on average (occasional)  . Drug use: No  . Sexual activity: Not Currently    Birth control/ protection: Surgical   Other Topics Concern  . Not on file   Social History Narrative   Pt lives in [place] with her son.  Unemployed.  Right handed.     Education college      Current Outpatient Prescriptions on File Prior to Visit  Medication Sig Dispense Refill  . allopurinol (ZYLOPRIM) 300 MG tablet TAKE 1 TABLET BY MOUTH EVERY DAY (Patient taking differently: TAKE 300 MG BY MOUTH DAILY AS NEEDED FOR GOUT) 90 tablet 2  . ALPRAZolam (XANAX) 1 MG tablet Take 1 tablet (1 mg total) by mouth at bedtime as needed for anxiety. 45 tablet 1  . carvedilol (COREG) 25 MG tablet Take 1 tablet (25 mg total) by mouth 2 (two) times daily. 60 tablet 11  . colchicine (COLCRYS) 0.6 MG tablet Take 1 tablet (0.6 mg total) by mouth daily as needed. Reported on 01/21/2016 (Patient taking differently: Take 0.6 mg by mouth daily as needed (GOUT). Reported on 01/21/2016) 45 tablet 1  .  diphenoxylate-atropine (LOMOTIL) 2.5-0.025 MG tablet Take 1 tablet by mouth every 8 (eight) hours as needed for diarrhea or loose stools. 30 tablet 0  . FLUoxetine (PROZAC) 20 MG tablet TAKE 1 TABLET(20 MG) BY MOUTH DAILY 90 tablet 0  . furosemide (LASIX) 40 MG tablet Take 1 tablet (40 mg total) by mouth daily. 30 tablet 0  . metoprolol succinate (TOPROL XL) 25 MG 24 hr tablet Take 2 tablets (50 mg total) by mouth daily.    Marland Kitchen  omeprazole (PRILOSEC) 20 MG capsule Take 1 capsule (20 mg total) by mouth daily. 30 capsule 1  . potassium chloride SA (K-DUR,KLOR-CON) 20 MEQ tablet Take 1 tablet (20 mEq total) by mouth daily. 30 tablet 0  . PRADAXA 150 MG CAPS capsule TAKE ONE CAPSULE BY MOUTH TWICE DAILY 60 capsule 5  . rosuvastatin (CRESTOR) 20 MG tablet Take 1 tablet (20 mg total) by mouth daily. 90 tablet 3  . traZODone (DESYREL) 50 MG tablet TAKE 1/2 TO 1 TABLET BY MOUTH EVERY NIGHT AT BEDTIME AS NEEDED FOR SLEEP 90 tablet 3  . VENTOLIN HFA 108 (90 Base) MCG/ACT inhaler INHALE 2 PUFFS INTO THE LUNGS EVERY 6 HOURS AS NEEDED FOR WHEEZING OR SHORTNESS OF BREATH 18 g 0  . losartan (COZAAR) 25 MG tablet Take 1 tablet (25 mg total) by mouth daily. 90 tablet 3  . [DISCONTINUED] propranolol (INDERAL) 60 MG tablet Take 60 mg by mouth daily.       Current Facility-Administered Medications on File Prior to Visit  Medication Dose Route Frequency Provider Last Rate Last Dose  . sodium pertechnetate (60mTc04) injection 10 millicurie  10 millicurie Intravenous Once PRN Julian Hy, MD       Allergies  Allergen Reactions  . Albuterol     Palpitations, intolerance  . Amiodarone     adverse reaction: Tremor/gait disurbance  . Atorvastatin     Body aches and pains--stiffness.   [name] [Pitavastatin]     Body aches  . Propoxyphene N-Acetaminophen Nausea And Vomiting   Family History  Problem Relation Age of Onset  . Pneumonia Father     deceased  . Hypertension Father   . Cancer Father   . Multiple  sclerosis Mother     hx  . Emphysema Other     runs in the family    PE: BP 128/82 (BP Location: Left Arm, Patient Position: Sitting)   Pulse 99   Wt 258 lb (117 kg)   SpO2 99%   BMI 45.70 kg/m  Wt Readings from Last 3 Encounters:  01/19/17 258 lb (117 kg)  01/15/17 263 lb (119.3 kg)  12/03/16 259 lb 12.8 oz (117.8 kg)   Constitutional: overweight, in NAD Eyes: PERRLA, EOMI, no exophthalmos, no lid lag, no stare ENT: moist mucous membranes, no thyromegaly, no thyroid bruits, no cervical lymphadenopathy Cardiovascular: Tachycardia, irregularly irregular rhythm, No MRG Respiratory: CTA B Gastrointestinal: abdomen soft, NT, ND, BS+ Musculoskeletal: no deformities, strength intact in all 4 Skin: moist, warm, no rashes Neurological: Very mild tremor with outstretched hands, DTR normal in all 4  ASSESSMENT: 1. Thyrotoxicosis  2. DM2, controlled  PLAN:  1. Patient with low TSH, with thyrotoxic sxs: heat intolerance, hyperdefecation, palpitations, tremors. Her TSI Abs are not elevated and her Uptake and scan was normal. - she was started on MMI 3 mo ago and her TFTs normalized but she came off 2/2 gastric burning sensation (only when taking 10 mg of MMI) and because she heard negative things about it. - we discussed about the fact that MMI is a safe med and I recommended her to restart, at 5 mg bid, which she agrees  - we discussed about other possible modalities of treatment for her thyrotoxicosis, to include PTU use, radioactive iodine ablation (but not usually with a normal Uptake and scan) or surgery. She would like to retry MMI . - will continue her beta blocker - I advised her to join my chart to communicate easier - RTC in 4 months,  but in 5 weeks for repeat labs  2. DM2 - well controlled >> last HbA1c was 6.5% - continue off Metformin (stopped 2/2 diarrhea)  Philemon Kingdom, MD PhD [facility] Endocrinology

## 2017-01-19 NOTE — Telephone Encounter (Signed)
3mo

## 2017-01-21 ENCOUNTER — Encounter: Payer: Self-pay | Admitting: Physician Assistant

## 2017-02-02 ENCOUNTER — Other Ambulatory Visit: Payer: Self-pay | Admitting: Medical

## 2017-02-02 NOTE — Telephone Encounter (Signed)
Is this okay to refill? 

## 2017-02-07 NOTE — Progress Notes (Signed)
Cardiology Office Note Date:  02/08/2017  Patient ID:  Stephanie [name] September 29, 1951, MRN [id number] PCP:  [name], PA-C  Electrophysiologist: Dr. [name] Neuology: Dr. Brett Fairy Gastroenterology: Dr. Deatra Ina   Chief Complaint:   History of Present Illness: Stephanie [name] is a 66 y.o. female with history of PAFib, sarcoidosis (eye), IBS, HLD, No obst CAD by cath in 2010, recurrent NICM felt 2/2 tachycardia/AF.  She comes in today to be seen for Dr. [name], last seen by him 01/15/17, at that visit he discussed unintentional medication non-compliance with reports of confusion (?side effect of her methimazole) she had apperant GI intolerances to her thyroid medicine as well and this impacted her overall intake and suspected confusion.  She saw endo a couple weeks ago and resumed on 1/2 dose.  Dr. [name] recommended to resume her Pradaxa BID and plan for DCCV, temporarily increased her lasix and BB.  She comes in today off her Methimazole, she states she can not tolerate this medicine, as soon as she got back on it she was sick to her stomach, felt bloated and thinks there is a component to he LE swelling related to it as well.  She is 193% certain that her nausea and GI symptoms clearly are connected to there thyroid medicine and can not take it. She had DOE and needs to pace herself but no rest SOB, no symptoms of PND or orthopnea, says she takes occassionally her medicine at night for trouble sleeping, but not routinely.  She has not had near syncope or syncope.  Unfortunately she missed Thursday and Friday Pradaxa secondary to the GI upset, and since stopping the thyroid medicine this is better and she is back on her other medicines as directed, though I am not convinced she is certain about all of them.  She is certain she is taking both coreg and metoprolol, specifically from Dr. [name].  Her AF history: --DCCV 2010, May 2014 with raid return of AF then sponatenous conversion to  SR --March 2015 discussed with Dr. Rayann Heman possible ablation but did not want to persue this, her amio was reduced to 200mg  daily and recommended lifestyle adjustments --Amiodarone ultimately stopped with concerns of possible toxicity with neuro symptoms of staggering/tremor with improvement off as well as hyperthyroidism  -- CM felt to be AF/rate related 25% IN 2012, that is improved to 50-55% in 2014>> Dec 2017 again 25% -- Mild abnormal sleep study no indication for CPAP 2014   Past Medical History:  Diagnosis Date  . Anemia   . Ankle fracture, right    x 2  . Antineoplastic and immunosuppressive drugs causing adverse effect in therapeutic use   . Asthma   . Atrial fibrillation ([place])   . Chronic venous hypertension without complications   . Colon polyps   . Colon polyps 2009  . Coronary artery disease    no CAD by cath 11.2010  . Depressive disorder, not elsewhere classified   . Diastolic CHF, chronic ([place])   . Diverticulosis of colon 2009   colonoscopy  . Dyslipidemia   . Glucose intolerance (impaired glucose tolerance)    hgb AIC 6.1 09/02/2009  . Gout   . Hepatitis, unspecified    hx of  . Hx of hysterectomy 2002  . Hyperlipidemia   . Hypertension   . Irritable bowel syndrome   . Lung nodule    38mm RLL nodule on CT Chest  07/2009, resolved by 2011 CT  . Noncompliance   . Obesity   .  Persistent atrial fibrillation ([place])    s/p DCCV 07/2009; Pradaxa Rx  . PONV (postoperative nausea and vomiting)   . Sarcoidosis    dx by eye exam  . Sleep disturbance 06/2013   sleep study, mild abnormality, no criteria for CPAP    Past Surgical History:  Procedure Laterality Date  . ABDOMINAL HYSTERECTOMY    . CARDIOVERSION     x 2  . CARDIOVERSION N/A 02/27/2013   Procedure: CARDIOVERSION;  Surgeon: Lelon Perla, MD;  Location: [facility] ENDOSCOPY;  Service: Cardiovascular;  Laterality: N/A;  . CHOLECYSTECTOMY    . COLONOSCOPY     Diverticulosis, colon polyps, repeat 2014, Dr.  Deatra Ina  . ESOPHAGOGASTRODUODENOSCOPY N/A 05/16/2014   Procedure: ESOPHAGOGASTRODUODENOSCOPY (EGD);  Surgeon: Wonda Horner, MD;  Location: [facility] ORS;  Service: Gastroenterology;  Laterality: N/A;  . HERNIA REPAIR  2009    Current Outpatient Prescriptions  Medication Sig Dispense Refill  . allopurinol (ZYLOPRIM) 300 MG tablet TAKE 1 TABLET BY MOUTH EVERY DAY (Patient taking differently: TAKE 300 MG BY MOUTH DAILY AS NEEDED FOR GOUT) 90 tablet 2  . ALPRAZolam (XANAX) 1 MG tablet Take 1 tablet (1 mg total) by mouth at bedtime as needed for anxiety. 45 tablet 1  . carvedilol (COREG) 25 MG tablet Take 1 tablet (25 mg total) by mouth 2 (two) times daily. 60 tablet 11  . colchicine (COLCRYS) 0.6 MG tablet Take 1 tablet (0.6 mg total) by mouth daily as needed. Reported on 01/21/2016 (Patient taking differently: Take 0.6 mg by mouth daily as needed (GOUT). Reported on 01/21/2016) 45 tablet 1  . diphenoxylate-atropine (LOMOTIL) 2.5-0.025 MG tablet Take 1 tablet by mouth every 8 (eight) hours as needed for diarrhea or loose stools. 30 tablet 0  . FLUoxetine (PROZAC) 20 MG tablet TAKE 1 TABLET(20 MG) BY MOUTH DAILY 90 tablet 0  . furosemide (LASIX) 40 MG tablet Take 1 tablet (40 mg total) by mouth daily. 30 tablet 0  . metoprolol succinate (TOPROL XL) 25 MG 24 hr tablet Take 2 tablets (50 mg total) by mouth daily.    Marland Kitchen omeprazole (PRILOSEC) 20 MG capsule Take 1 capsule (20 mg total) by mouth daily. 30 capsule 1  . potassium chloride SA (K-DUR,KLOR-CON) 20 MEQ tablet Take 1 tablet (20 mEq total) by mouth daily. 30 tablet 0  . PRADAXA 150 MG CAPS capsule TAKE ONE CAPSULE BY MOUTH TWICE DAILY 60 capsule 5  . rosuvastatin (CRESTOR) 20 MG tablet Take 1 tablet (20 mg total) by mouth daily. 90 tablet 3  . traZODone (DESYREL) 50 MG tablet TAKE 1/2 TO 1 TABLET BY MOUTH EVERY NIGHT AT BEDTIME AS NEEDED FOR SLEEP 90 tablet 3  . VENTOLIN HFA 108 (90 Base) MCG/ACT inhaler INHALE 2 PUFFS INTO THE LUNGS EVERY 6 HOURS AS NEEDED  FOR WHEEZING OR SHORTNESS OF BREATH 18 g 0  . losartan (COZAAR) 25 MG tablet Take 1 tablet (25 mg total) by mouth daily. 90 tablet 3   No current facility-administered medications for this visit.     Allergies:   Albuterol; Amiodarone; Atorvastatin; Livalo [pitavastatin]; and Propoxyphene n-acetaminophen   Social History:  The patient  reports that she has never smoked. She has never used smokeless tobacco. She reports that she drinks alcohol. She reports that she does not use drugs.   Family History:  The patient's family history includes Cancer in her father; Emphysema in her other; Hypertension in her father; Multiple sclerosis in her mother; Pneumonia in her father.  ROS:  Please  see the history of present illness.   All other systems are reviewed and otherwise negative.   PHYSICAL EXAM:  VS:  BP 138/74   Pulse 91   Ht 5\' 3"  (1.6 m)   Wt 256 lb (116.1 kg)   BMI 45.35 kg/m  BMI: Body mass index is 45.35 kg/m. Very plesant BF, obese, in no acute distress  HEENT: normocephalic, atraumatic  Neck: no JVD, carotid bruits or masses Cardiac:  IRRR; extrasystoles,  no significant murmurs, no rubs, or gallops Lungs:  CTA b/l, no wheezing, rhonchi or rales  Abd: soft, nontender MS: no deformity or atrophy Ext: 1++ edema b/l LE  Skin: warm and dry, no rash Neuro:  No gross deficits appreciated Psych: euthymic mood, full affect  EKG:  Done 01/15/17 AFlutter, 93bpm, QRS 155ms  10/18/16 TTE Study Conclusions - Left ventricle: The cavity size was mildly dilated. There was   moderate concentric hypertrophy. Systolic function was severely   reduced. The estimated ejection fraction was 25%. Diffuse   hypokinesis. The study is not technically sufficient to allow   evaluation of LV diastolic function. - Mitral valve: Calcified annulus. Mildly thickened leaflets .   There was mild regurgitation. - Left atrium: The atrium was severely dilated. (7mm) - Right ventricle: Systolic function  was moderately reduced. - Right atrium: The atrium was mildly dilated. - Tricuspid valve: There was mild regurgitation.   05/29/13: Echocardiogram Study Conclusions Left ventricle: LVEF is approximately 50 to 55% with very mild inferior and septal hypokinesis  02/08/13: Echocardiogram Study Conclusions - Left ventricle: The cavity size was mildly dilated. Wall thickness was increased in a pattern of moderate LVH. Systolic function was severely reduced. The estimated ejection fraction was in the range of 25% to 30%. - Left atrium: The atrium was mildly dilated.  Recent Labs: 11/11/2016: ALT 19; B Natriuretic Peptide 1,473.6 11/13/2016: Magnesium 1.7 11/14/2016: Hemoglobin 10.2; Platelets 196 11/18/2016: BUN 18; Creat 1.02; Potassium 4.0; Sodium 142 12/21/2016: TSH 2.23  08/20/2016: Cholesterol 221; HDL 60; LDL Cholesterol 140; Total CHOL/HDL Ratio 3.7; Triglycerides 103; VLDL 21   CrCl cannot be calculated (Patient's most recent lab result is older than the maximum 21 days allowed.).   Wt Readings from Last 3 Encounters:  02/08/17 256 lb (116.1 kg)  01/19/17 258 lb (117 kg)  01/15/17 263 lb (119.3 kg)     Other studies reviewed: Additional studies/records reviewed today include: summarized above  ASSESSMENT AND PLAN:  1. PAFib     CHADS2Vasc is at least 3 on Pradaxa, counseled on the importance of taking routinely and as directed     Denies any bleeding or signs of bleeding     11/18/16: Creat 1.02, (calc cl =104)      11/14/16 H/H 10/32     Off amiodarone with concerns of toxicity with neurological symptoms that resolved off it, thyroid tox following with endo, 12/21/16 TSH 2.23  Lengthy discussion today about the importance of her thyroid management, she expresses significant frustration over this, says the the endocrinologist told her it is either the methimazole or "cutting my thyroid out", she feels like there has to be an alternative and just cannot tolerate the medicine  and would like to pursue a second opinion.  She wants to get the DCCV done, we discussed that without adequate thyroid management even if we did TEE/DCCV (since she missed 2 days of Pradaxa last week)  my concern is that she would end up back in AF with hyperthyroid.  Her rate  is fairly well controlled today.    Her last TSH was better but seems she was on tx then.  2. HTN     Looks OK  3. Tremor, gait discturbance     Remains resolved off amio     c/w neurology   4. NICM    EF back to 25%    On BB/ARB, diuretic We discussed that I thought her edema had more to do with her CM, and she is instructed to take her lasix BID for 3 days and monitor this.  Her weight is down, but remains edematous.  She reports drinking "a lot of water" the last week to try and flush the thyroid medicine out byt has slowed this down already.  Discussed with the patient the importance of healthy eating habits and taking her medicines as prescribed.    Disposition: BMET, CBC, and TSH today, take lasix 40mg  BID for 3 days then resume daily, she will reach out to her PMD with her concerns/frustrations regarding her thyroid management, we will see her back in 3-4 weeks, sooner if needed hopefully she will have had an opportunity to get back with endo or her PMD for a second opinion.     Current medicines are reviewed at length with the patient today.  The patient did not have any concerns regarding medicines.  Haywood Lasso, PA-C 02/08/2017 1:21 PM     [address] [place] [id number] [phone] (office)  [phone] (fax)

## 2017-02-08 ENCOUNTER — Ambulatory Visit (INDEPENDENT_AMBULATORY_CARE_PROVIDER_SITE_OTHER): Payer: Medicare Other | Admitting: Physician Assistant

## 2017-02-08 VITALS — BP 138/74 | HR 91 | Ht 63.0 in | Wt 256.0 lb

## 2017-02-08 DIAGNOSIS — I420 Dilated cardiomyopathy: Secondary | ICD-10-CM | POA: Diagnosis not present

## 2017-02-08 DIAGNOSIS — I481 Persistent atrial fibrillation: Secondary | ICD-10-CM | POA: Diagnosis not present

## 2017-02-08 DIAGNOSIS — I5032 Chronic diastolic (congestive) heart failure: Secondary | ICD-10-CM | POA: Diagnosis not present

## 2017-02-08 DIAGNOSIS — I4819 Other persistent atrial fibrillation: Secondary | ICD-10-CM

## 2017-02-08 DIAGNOSIS — I10 Essential (primary) hypertension: Secondary | ICD-10-CM | POA: Diagnosis not present

## 2017-02-08 NOTE — Patient Instructions (Addendum)
Medication Instructions:   TAKE LASIX TWICE A DAY FOR 3 DAYS ONLY   THEN TAKE  ONCE A DAY   If you need a refill on your cardiac medications before your next appointment, please call your pharmacy.  Labwork: BMET CBC AND TSH    Testing/Procedures: NONE ORDERED  TODAY    Follow-Up: WITH RENEE OR KLEIN IN ONE MONTH    Any Other Special Instructions Will Be Listed Below (If Applicable).

## 2017-02-09 LAB — CBC
Hematocrit: 37.9 % (ref 34.0–46.6)
Hemoglobin: 11.9 g/dL (ref 11.1–15.9)
MCH: 29.7 pg (ref 26.6–33.0)
MCHC: 31.4 g/dL — ABNORMAL LOW (ref 31.5–35.7)
MCV: 95 fL (ref 79–97)
PLATELETS: 181 10*3/uL (ref 150–379)
RBC: 4.01 x10E6/uL (ref 3.77–5.28)
RDW: 15.4 % (ref 12.3–15.4)
WBC: 6.2 10*3/uL (ref 3.4–10.8)

## 2017-02-09 LAB — BASIC METABOLIC PANEL
BUN / CREAT RATIO: 16 (ref 12–28)
BUN: 16 mg/dL (ref 8–27)
CO2: 26 mmol/L (ref 18–29)
CREATININE: 1.02 mg/dL — AB (ref 0.57–1.00)
Calcium: 9.2 mg/dL (ref 8.7–10.3)
Chloride: 100 mmol/L (ref 96–106)
GFR calc Af Amer: 67 mL/min/{1.73_m2} (ref >59)
GFR, EST NON AFRICAN AMERICAN: 58 mL/min/{1.73_m2} — AB (ref >59)
GLUCOSE: 153 mg/dL — AB (ref 65–99)
Potassium: 3.9 mmol/L (ref 3.5–5.2)
SODIUM: 144 mmol/L (ref 134–144)

## 2017-02-09 LAB — TSH: TSH: 5.42 u[IU]/mL — ABNORMAL HIGH (ref 0.450–4.500)

## 2017-02-10 ENCOUNTER — Telehealth: Payer: Self-pay | Admitting: *Deleted

## 2017-02-10 NOTE — Telephone Encounter (Signed)
SPOKE TO PT AND PT AWARE OF RESULTS 

## 2017-02-10 NOTE — Telephone Encounter (Signed)
-----   Message from [place], [place] sent at 02/09/2017  5:12 PM EDT ----- Please let the patient know her electrolytes, kidneys and blood counts look stable, her thyroid level is off again, though high result this time.  Please forward to her PMD and endocrinologist Dr. Cruzita Lederer and tell the patient to follow up for recommendations and instructions for her thyroid management.  Thanks [employer]

## 2017-02-12 ENCOUNTER — Encounter (HOSPITAL_COMMUNITY): Payer: Self-pay

## 2017-02-12 ENCOUNTER — Ambulatory Visit (HOSPITAL_COMMUNITY): Admit: 2017-02-12 | Payer: Medicare Other | Admitting: Cardiology

## 2017-02-12 ENCOUNTER — Other Ambulatory Visit: Payer: Self-pay | Admitting: Medical

## 2017-02-12 SURGERY — CARDIOVERSION
Anesthesia: Monitor Anesthesia Care

## 2017-02-19 ENCOUNTER — Ambulatory Visit (INDEPENDENT_AMBULATORY_CARE_PROVIDER_SITE_OTHER): Payer: Medicare Other | Admitting: Medical

## 2017-02-19 VITALS — BP 110/72 | HR 88 | Wt 253.6 lb

## 2017-02-19 DIAGNOSIS — E118 Type 2 diabetes mellitus with unspecified complications: Secondary | ICD-10-CM

## 2017-02-19 DIAGNOSIS — F411 Generalized anxiety disorder: Secondary | ICD-10-CM | POA: Diagnosis not present

## 2017-02-19 DIAGNOSIS — F341 Dysthymic disorder: Secondary | ICD-10-CM | POA: Diagnosis not present

## 2017-02-19 DIAGNOSIS — Z1239 Encounter for other screening for malignant neoplasm of breast: Secondary | ICD-10-CM

## 2017-02-19 DIAGNOSIS — E0590 Thyrotoxicosis, unspecified without thyrotoxic crisis or storm: Secondary | ICD-10-CM

## 2017-02-19 DIAGNOSIS — I429 Cardiomyopathy, unspecified: Secondary | ICD-10-CM

## 2017-02-19 DIAGNOSIS — E785 Hyperlipidemia, unspecified: Secondary | ICD-10-CM

## 2017-02-19 DIAGNOSIS — J4540 Moderate persistent asthma, uncomplicated: Secondary | ICD-10-CM

## 2017-02-19 DIAGNOSIS — Z1231 Encounter for screening mammogram for malignant neoplasm of breast: Secondary | ICD-10-CM

## 2017-02-19 DIAGNOSIS — M1A9XX0 Chronic gout, unspecified, without tophus (tophi): Secondary | ICD-10-CM | POA: Diagnosis not present

## 2017-02-19 DIAGNOSIS — Z1211 Encounter for screening for malignant neoplasm of colon: Secondary | ICD-10-CM

## 2017-02-19 MED ORDER — BUDESONIDE-FORMOTEROL FUMARATE 160-4.5 MCG/ACT IN AERO: 2.0000 | INHALATION_SPRAY | Freq: Two times a day (BID) | RESPIRATORY_TRACT | 0 refills | Status: DC

## 2017-02-19 MED ORDER — ALPRAZOLAM 1 MG PO TABS: 1.0000 mg | ORAL_TABLET | Freq: Every evening | ORAL | 2 refills | Status: DC | PRN

## 2017-02-19 NOTE — Patient Instructions (Addendum)
Encounter Diagnoses  Name Primary?  . Moderate persistent asthma without complication Yes  . Chronic gout without tophus, unspecified cause, unspecified site   . Dyslipidemia   . Cardiomyopathy, secondary ([place])   . Thyrotoxicosis without thyroid storm, unspecified thyrotoxicosis type   . Type 2 diabetes mellitus with complication, without long-term current use of insulin (HCC)    Recommendations  Please review your medications at home to help [place] verify   You have some duplicate types of medications on your list today, so I don't think its accurate  Call us back with your medications that your are taking so we can verify  schedule a mammogram  We will refer you back to gastroenterology for updated colonoscopy  Follow up with endocrinology   Asthma    Your breathing test showed decreases flow today  Begin trial of Symbicort inhaler 2 puffs twice daily to see if this helps your breathing

## 2017-02-19 NOTE — Progress Notes (Signed)
Subjective: Chief Complaint  Patient presents with  . med check    med check , no other concerns    Here for med check, visit prompted by insurance notation of frequent albuterol refills.     Sees endocrinology next week in f/u on thyroid issues.   Not willing to use the methimazole given all the side effects and problems she felt the medication was causing despite Dr. Chrissie Noa recommendations.   She plans to pursue thyroid surgery instead for overactive thyroid.  May want second opinion from another endocrinologist.   Mood- goes through ups and downs, sometimes more anxious, but she notes the medication for hyperthyroidism made her feel bad emotionally and phsicallly.    Lipids - she thinks she is taking the Crestor daily.  Fasting today.  Diabetes - not taking metformin now given the diarrhea from thyroid issues.   She is checking sugars some.   No recent highs or lows but not checking sugar daily.  Asthma - does have problems breathing but thinks its related to the overactive thyroid.  Uses inhaler maybe twice a week.  She notes that pharmacy keeps sending her refills, but she says she has several inhalers and doesn't need them.     Gout - no recent problems, drinking a lot of water.      No other aggravating or relieving factors. No other complaint.  Past Medical History:  Diagnosis Date  . Anemia   . Ankle fracture, right    x 2  . Antineoplastic and immunosuppressive drugs causing adverse effect in therapeutic use   . Asthma   . Atrial fibrillation ([place])   . Chronic venous hypertension without complications   . Colon polyps   . Colon polyps 2009  . Coronary artery disease    no CAD by cath 11.2010  . Depressive disorder, not elsewhere classified   . Diastolic CHF, chronic ([place])   . Diverticulosis of colon 2009   colonoscopy  . Dyslipidemia   . Glucose intolerance (impaired glucose tolerance)    hgb AIC 6.1 09/02/2009  . Gout   . Hepatitis, unspecified    hx of  . Hx  of hysterectomy 2002  . Hyperlipidemia   . Hypertension   . Irritable bowel syndrome   . Lung nodule    55mm RLL nodule on CT Chest  07/2009, resolved by 2011 CT  . Noncompliance   . Obesity   . Persistent atrial fibrillation ([place])    s/p DCCV 07/2009; Pradaxa Rx  . PONV (postoperative nausea and vomiting)   . Sarcoidosis    dx by eye exam  . Sleep disturbance 06/2013   sleep study, mild abnormality, no criteria for CPAP    Current Outpatient Prescriptions on File Prior to Visit  Medication Sig Dispense Refill  . allopurinol (ZYLOPRIM) 300 MG tablet TAKE 1 TABLET BY MOUTH EVERY DAY (Patient taking differently: TAKE 300 MG BY MOUTH DAILY AS NEEDED FOR GOUT) 90 tablet 2  . carvedilol (COREG) 25 MG tablet Take 1 tablet (25 mg total) by mouth 2 (two) times daily. 60 tablet 11  . colchicine (COLCRYS) 0.6 MG tablet Take 1 tablet (0.6 mg total) by mouth daily as needed. Reported on 01/21/2016 (Patient taking differently: Take 0.6 mg by mouth daily as needed (GOUT). Reported on 01/21/2016) 45 tablet 1  . diphenoxylate-atropine (LOMOTIL) 2.5-0.025 MG tablet Take 1 tablet by mouth every 8 (eight) hours as needed for diarrhea or loose stools. 30 tablet 0  . furosemide (LASIX) 40  MG tablet Take 1 tablet (40 mg total) by mouth daily. 30 tablet 0  . metoprolol succinate (TOPROL XL) 25 MG 24 hr tablet Take 2 tablets (50 mg total) by mouth daily.    . potassium chloride SA (K-DUR,KLOR-CON) 20 MEQ tablet Take 1 tablet (20 mEq total) by mouth daily. 30 tablet 0  . PRADAXA 150 MG CAPS capsule TAKE ONE CAPSULE BY MOUTH TWICE DAILY 60 capsule 5  . rosuvastatin (CRESTOR) 20 MG tablet Take 1 tablet (20 mg total) by mouth daily. 90 tablet 3  . traZODone (DESYREL) 50 MG tablet TAKE 1/2 TO 1 TABLET BY MOUTH EVERY NIGHT AT BEDTIME AS NEEDED FOR SLEEP 90 tablet 3  . VENTOLIN HFA 108 (90 Base) MCG/ACT inhaler INHALE 2 PUFFS INTO THE LUNGS EVERY 6 HOURS AS NEEDED FOR WHEEZING OR SHORTNESS OF BREATH 18 g 0  . losartan  (COZAAR) 25 MG tablet Take 1 tablet (25 mg total) by mouth daily. 90 tablet 3  . [DISCONTINUED] propranolol (INDERAL) 60 MG tablet Take 60 mg by mouth daily.       No current facility-administered medications on file prior to visit.    Past Surgical History:  Procedure Laterality Date  . ABDOMINAL HYSTERECTOMY    . CARDIOVERSION     x 2  . CARDIOVERSION N/A 02/27/2013   Procedure: CARDIOVERSION;  Surgeon: Lelon Perla, MD;  Location: [facility] ENDOSCOPY;  Service: Cardiovascular;  Laterality: N/A;  . CHOLECYSTECTOMY    . COLONOSCOPY     Diverticulosis, colon polyps, repeat 2014, Dr. Deatra Ina  . ESOPHAGOGASTRODUODENOSCOPY N/A 05/16/2014   Procedure: ESOPHAGOGASTRODUODENOSCOPY (EGD);  Surgeon: Wonda Horner, MD;  Location: [facility] ORS;  Service: Gastroenterology;  Laterality: N/A;  . HERNIA REPAIR  2009     ROS as in subjective   Objective: BP 110/72   Pulse 88   Wt 253 lb 9.6 oz (115 kg)   SpO2 99%   BMI 44.92 kg/m   Wt Readings from Last 3 Encounters:  02/19/17 253 lb 9.6 oz (115 kg)  02/08/17 256 lb (116.1 kg)  01/19/17 258 lb (117 kg)   General appearance: alert, no distress, WD/WN,  HEENT: normocephalic, sclerae anicteric, TMs pearly, nares patent, no discharge or erythema, pharynx normal Oral cavity: MMM, no lesions Neck: supple, no lymphadenopathy, no thyromegaly, no masses.  No JVD Heart: RRR, normal S1, S2, no murmurs Lungs: CTA bilaterally, no wheezes, rhonchi, or rales Abdomen: +bs, soft, non tender, non distended, no masses, no hepatomegaly, no splenomegaly Pulses: 2+ symmetric, upper and lower extremities, normal cap refill      Assessment: Encounter Diagnoses  Name Primary?  . Moderate persistent asthma without complication Yes  . Chronic gout without tophus, unspecified cause, unspecified site   . Dyslipidemia   . Cardiomyopathy, secondary ([place])   . Thyrotoxicosis without thyroid storm, unspecified thyrotoxicosis type   . Type 2 diabetes mellitus with  complication, without long-term current use of insulin ([place])   . Dysthymic disorder   . Generalized anxiety disorder   . Screen for colon cancer   . Screening for breast cancer       Plan: Asthma - PFT reviewed.  Begin trial of Symbicort sample, c/t rescue albuterol as needed.  She will touch base with pharmacy as they keep sending her refill inhalers without her requesting them  Gout - lab today, c/t allopurinol  Dyslipidemia - lab today, c/t statin  Cardiomyopathy - followed by cardiology, c/t daily weights, limiting salt.  C/t same medication  Diabetes -  she stopped metformin, will c/t to monitor sugars for now.  Exercise, advised healthy diet, discussed goals for sugars  Thyroid issues - f/u with endocrinology   Anxiety, dysthymia - doing ok at the time begin, c/t xanax prn, recommended she consider counseling  Referral back to GI for colonoscopy  Advised she scheduled mammogram  F/u for pelvic exam at her convenience   Stephanie [name] was seen today for med check.  Diagnoses and all orders for this visit:  Moderate persistent asthma without complication -     Spirometry with Graph  Chronic gout without tophus, unspecified cause, unspecified site -     Uric acid  Dyslipidemia -     Lipid panel  Cardiomyopathy, secondary ([place])  Thyrotoxicosis without thyroid storm, unspecified thyrotoxicosis type  Type 2 diabetes mellitus with complication, without long-term current use of insulin (HCC)  Dysthymic disorder  Generalized anxiety disorder  Screen for colon cancer -     Ambulatory referral to Gastroenterology  Screening for breast cancer -     MM DIGITAL SCREENING BILATERAL; Future  Other orders -     ALPRAZolam (XANAX) 1 MG tablet; Take 1 tablet (1 mg total) by mouth at bedtime as needed for anxiety. -     budesonide-formoterol (SYMBICORT) 160-4.5 MCG/ACT inhaler; Inhale 2 puffs into the lungs 2 (two) times daily.  Spent > 45 minutes face to face with patient  in discussion of symptoms, evaluation, plan and recommendations.

## 2017-02-20 LAB — LIPID PANEL
CHOL/HDL RATIO: 3.4 ratio (ref <5.0)
Cholesterol: 158 mg/dL (ref <200)
HDL: 46 mg/dL — ABNORMAL LOW (ref >50)
LDL Cholesterol: 93 mg/dL (ref <100)
Triglycerides: 95 mg/dL (ref <150)
VLDL: 19 mg/dL (ref <30)

## 2017-02-20 LAB — URIC ACID: URIC ACID, SERUM: 10.7 mg/dL — AB (ref 2.5–7.0)

## 2017-02-22 ENCOUNTER — Other Ambulatory Visit: Payer: Self-pay | Admitting: Medical

## 2017-02-22 ENCOUNTER — Other Ambulatory Visit: Payer: Self-pay

## 2017-02-22 DIAGNOSIS — I5021 Acute systolic (congestive) heart failure: Secondary | ICD-10-CM

## 2017-02-22 DIAGNOSIS — E118 Type 2 diabetes mellitus with unspecified complications: Secondary | ICD-10-CM

## 2017-02-22 MED ORDER — ALLOPURINOL 300 MG PO TABS: ORAL_TABLET | ORAL | 2 refills | Status: DC

## 2017-02-22 MED ORDER — TRAZODONE HCL 50 MG PO TABS: ORAL_TABLET | ORAL | 3 refills | Status: DC

## 2017-02-23 ENCOUNTER — Other Ambulatory Visit (INDEPENDENT_AMBULATORY_CARE_PROVIDER_SITE_OTHER): Payer: Medicare Other

## 2017-02-23 DIAGNOSIS — E0590 Thyrotoxicosis, unspecified without thyrotoxic crisis or storm: Secondary | ICD-10-CM

## 2017-02-23 LAB — T4, FREE: Free T4: 0.77 ng/dL (ref 0.60–1.60)

## 2017-02-23 LAB — T3, FREE: T3 FREE: 3.3 pg/mL (ref 2.3–4.2)

## 2017-02-23 LAB — TSH: TSH: 5.86 u[IU]/mL — AB (ref 0.35–4.50)

## 2017-02-24 ENCOUNTER — Other Ambulatory Visit: Payer: Self-pay

## 2017-02-24 DIAGNOSIS — E0590 Thyrotoxicosis, unspecified without thyrotoxic crisis or storm: Secondary | ICD-10-CM

## 2017-02-24 MED ORDER — METHIMAZOLE 5 MG PO TABS: ORAL_TABLET | ORAL | 0 refills | Status: DC

## 2017-02-24 NOTE — Telephone Encounter (Signed)
Called and LVM advising patient to call back regarding lab results, advised the change in medication, and advised to call to make 2 month lab appointment. Left call back number to discuss.

## 2017-02-25 ENCOUNTER — Telehealth: Payer: Self-pay

## 2017-02-25 NOTE — Telephone Encounter (Signed)
LVM, gave lab results. Gave call back number if any questions or concerns.  

## 2017-02-25 NOTE — Telephone Encounter (Signed)
Called and LVM with patient advising of note. Advisied to call back to make lab appointment and if she had any questions. Left call back number.

## 2017-02-25 NOTE — Telephone Encounter (Signed)
Patient returned call states that she does not like taking the methimazole, and will not take this. She states that it burns her stomach, and she does not like the side effects and refuses to take it. I advised patient I would notify you, as she did not want to make lab appointment if she was not taking the medication. She would like to know what she can do next.

## 2017-02-25 NOTE — Telephone Encounter (Signed)
Ok to stay off Methimazole for now but I would still recommend labs in 1 month. She discussed with PCP that she would agree with thyroid Sx. We can discuss more about this (may not need this if tests stay normal after stopping MMI) after the results are back in 1 mo >> I can refer her to surgery then.

## 2017-02-25 NOTE — Telephone Encounter (Signed)
Called and LVM advising patient of note. Also to call back if any questions, and to schedule lab appointment in one month.

## 2017-02-25 NOTE — Telephone Encounter (Signed)
-----   Message from Philemon Kingdom, MD sent at 02/23/2017  5:48 PM EDT ----- Almyra Free, can you please call pt: TSH is slightly high, so let's reduce the methimazole to 5 mg daily (I am not sure why this is not on her med list, but we restarted this at 5 mg twice a day at last visit at the beginning of April - can you please add this new dose to her medication list? She needs to come back for labs in 2 months: can you please order: TSH, fT4 and fT3?

## 2017-03-09 NOTE — Progress Notes (Signed)
Cardiology Office Note Date:  03/10/2017  Patient ID:  Stephanie, [name] [birth year], MRN [id number] PCP:  Carlena Hurl, PA-C  Electrophysiologist: Dr. [name] Neuology: Dr. Brett Fairy Gastroenterology: Dr. Deatra Ina   Chief Complaint:   History of Present Illness: Stephanie [name] is a 66 y.o. female with history of PAFib, sarcoidosis (eye), IBS, HLD, No obst CAD by cath in 2010, recurrent NICM felt 2/2 tachycardia/AF.  She comes in today to be seen for Dr. [name], last seen by him 01/15/17, at that visit he discussed unintentional medication non-compliance with reports of confusion (?side effect of her methimazole) she had apperant GI intolerances to her thyroid medicine as well and this impacted her overall intake and suspected confusion.  She saw endo a couple weeks ago and resumed on 1/2 dose.  Dr. [name] recommended to resume her Pradaxa BID and plan for DCCV, temporarily increased her lasix and BB.  She was seen by myself in f/u last month off her Methimazole, she stated she can not tolerate this medicine, as soon as she got back on it she was sick to her stomach, felt bloated and thinks there is a component to he LE swelling related to it as well.  She was 413% certain that her nausea and GI symptoms clearly are connected to there thyroid medicine and can not take it. She had DOE and needs to pace herself but no rest SOB, no symptoms of PND or orthopnea, says she takes occassionally her medicine at night for trouble sleeping, but not routinely.  DCCV was not pursued, unfortunately she missed 2 days of her Pradaxa secondary to the GI upset, and since stopping the thyroid medicine this is better and she is back on her other medicines as directed, though I was not convinced she is certain about all of them.  She is certain she is taking both coreg and metoprolol, specifically from Dr. [name].  She comes in today feeling so much better off the Methimazole, she is pending referral to a new  endocrinologist.  She denies any kind of CP, no overt palpitations, no dizziness, near syncope or syncope,  No SOB.  She thinks her BP is elevated today because last night late she drank a strong Ice tea and had ice cream, these kept her up all night and she hasntr slept.  She has some edema today, she says is better the yesterday and attributes this to not having been up and around to much yesterday, gets swollen when seated for a bit.  She assures me she has not missed a single dose of her Pradaxa since her last visit a month ago, has been very religious about it.  She denies any bleeding or signs of bleeding.  She has been inadvertently taking 2 coreg tablets BID though.    Her AF history: --DCCV 2010, May 2014 with raid return of AF then sponatenous conversion to SR --March 2015 discussed with Dr. Rayann Heman possible ablation but did not want to persue this, her amio was reduced to 200mg  daily and recommended lifestyle adjustments --Amiodarone ultimately stopped with concerns of possible toxicity with neuro symptoms of staggering/tremor with improvement off as well as hyperthyroidism  -- CM felt to be AF/rate related 25% IN 2012, that is improved to 50-55% in 2014>> Dec 2017 again 25% -- Mild abnormal sleep study no indication for CPAP 2014   Past Medical History:  Diagnosis Date  . Anemia   . Ankle fracture, right    x 2  . Antineoplastic  and immunosuppressive drugs causing adverse effect in therapeutic use   . Asthma   . Atrial fibrillation ([place])   . Chronic venous hypertension without complications   . Colon polyps   . Colon polyps 2009  . Coronary artery disease    no CAD by cath 11.2010  . Depressive disorder, not elsewhere classified   . Diastolic CHF, chronic ([place])   . Diverticulosis of colon 2009   colonoscopy  . Dyslipidemia   . Glucose intolerance (impaired glucose tolerance)    hgb AIC 6.1 09/02/2009  . Gout   . Hepatitis, unspecified    hx of  . Hx of hysterectomy 2002    . Hyperlipidemia   . Hypertension   . Irritable bowel syndrome   . Lung nodule    46mm RLL nodule on CT Chest  07/2009, resolved by 2011 CT  . Noncompliance   . Obesity   . Persistent atrial fibrillation ([place])    s/p DCCV 07/2009; Pradaxa Rx  . PONV (postoperative nausea and vomiting)   . Sarcoidosis    dx by eye exam  . Sleep disturbance 06/2013   sleep study, mild abnormality, no criteria for CPAP    Past Surgical History:  Procedure Laterality Date  . ABDOMINAL HYSTERECTOMY    . CARDIOVERSION     x 2  . CARDIOVERSION N/A 02/27/2013   Procedure: CARDIOVERSION;  Surgeon: Lelon Perla, MD;  Location: [facility] ENDOSCOPY;  Service: Cardiovascular;  Laterality: N/A;  . CHOLECYSTECTOMY    . COLONOSCOPY     Diverticulosis, colon polyps, repeat 2014, Dr. Deatra Ina  . ESOPHAGOGASTRODUODENOSCOPY N/A 05/16/2014   Procedure: ESOPHAGOGASTRODUODENOSCOPY (EGD);  Surgeon: Wonda Horner, MD;  Location: [facility] ORS;  Service: Gastroenterology;  Laterality: N/A;  . HERNIA REPAIR  2009    Current Outpatient Prescriptions  Medication Sig Dispense Refill  . allopurinol (ZYLOPRIM) 300 MG tablet TAKE 300 MG BY MOUTH DAILY AS NEEDED FOR GOUT 90 tablet 2  . ALPRAZolam (XANAX) 1 MG tablet Take 1 tablet (1 mg total) by mouth at bedtime as needed for anxiety. 45 tablet 2  . budesonide-formoterol (SYMBICORT) 160-4.5 MCG/ACT inhaler Inhale 2 puffs into the lungs 2 (two) times daily. 1 Inhaler 0  . carvedilol (COREG) 25 MG tablet Take 1 tablet (25 mg total) by mouth 2 (two) times daily. (Patient taking differently: Take 50 mg by mouth 2 (two) times daily. ) 60 tablet 11  . colchicine (COLCRYS) 0.6 MG tablet Take 1 tablet (0.6 mg total) by mouth daily as needed. Reported on 01/21/2016 (Patient taking differently: Take 0.6 mg by mouth daily as needed (GOUT). Reported on 01/21/2016) 45 tablet 1  . diphenoxylate-atropine (LOMOTIL) 2.5-0.025 MG tablet Take 1 tablet by mouth every 8 (eight) hours as needed for diarrhea or loose  stools. 30 tablet 0  . furosemide (LASIX) 40 MG tablet Take 1 tablet (40 mg total) by mouth daily. 30 tablet 0  . metoprolol succinate (TOPROL XL) 25 MG 24 hr tablet Take 2 tablets (50 mg total) by mouth daily.    . potassium chloride SA (K-DUR,KLOR-CON) 20 MEQ tablet Take 1 tablet (20 mEq total) by mouth daily. 30 tablet 0  . PRADAXA 150 MG CAPS capsule TAKE ONE CAPSULE BY MOUTH TWICE DAILY 60 capsule 5  . rosuvastatin (CRESTOR) 20 MG tablet Take 1 tablet (20 mg total) by mouth daily. 90 tablet 3  . traZODone (DESYREL) 50 MG tablet TAKE 1/2 TO 1 TABLET BY MOUTH EVERY NIGHT AT BEDTIME AS NEEDED FOR SLEEP 90  tablet 3  . VENTOLIN HFA 108 (90 Base) MCG/ACT inhaler INHALE 2 PUFFS INTO THE LUNGS EVERY 6 HOURS AS NEEDED FOR WHEEZING OR SHORTNESS OF BREATH 18 g 0  . losartan (COZAAR) 25 MG tablet Take 1 tablet (25 mg total) by mouth daily. 90 tablet 3   No current facility-administered medications for this visit.     Allergies:   Albuterol; Amiodarone; Atorvastatin; Livalo [pitavastatin]; and Propoxyphene n-acetaminophen   Social History:  The patient  reports that she has never smoked. She has never used smokeless tobacco. She reports that she drinks alcohol. She reports that she does not use drugs.   Family History:  The patient's family history includes Cancer in her father; Emphysema in her other; Hypertension in her father; Multiple sclerosis in her mother; Pneumonia in her father.  ROS:  Please see the history of present illness.   All other systems are reviewed and otherwise negative.   PHYSICAL EXAM:  VS:  BP (!) 144/94   Pulse 90   Ht 5\' 3"  (1.6 m)   Wt 254 lb (115.2 kg)   BMI 44.99 kg/m  BMI: Body mass index is 44.99 kg/m. Very plesant BF, obese, in no acute distress  HEENT: normocephalic, atraumatic  Neck: no JVD, carotid bruits or masses Cardiac:  IRRR; extrasystoles,  no significant murmurs, no rubs, or gallops Lungs: CTA b/l, no wheezing, rhonchi or rales  Abd: soft,  nontender MS: no deformity or atrophy Ext:  Trace edema b/l LE  Skin: warm and dry, no rash Neuro:  No gross deficits appreciated Psych: euthymic mood, full affect  EKG:  Done today and reviewed by myself: AFib/flutter, 90bpm, QTc is reported as long, difficult to assess in current rhythm, QRS 121ms, T changes nonspecific and similar to previous Done 01/15/17 AFlutter, 93bpm, QRS 182ms  10/18/16 TTE Study Conclusions - Left ventricle: The cavity size was mildly dilated. There was   moderate concentric hypertrophy. Systolic function was severely   reduced. The estimated ejection fraction was 25%. Diffuse   hypokinesis. The study is not technically sufficient to allow   evaluation of LV diastolic function. - Mitral valve: Calcified annulus. Mildly thickened leaflets .   There was mild regurgitation. - Left atrium: The atrium was severely dilated. (66mm) - Right ventricle: Systolic function was moderately reduced. - Right atrium: The atrium was mildly dilated. - Tricuspid valve: There was mild regurgitation.   05/29/13: Echocardiogram Study Conclusions Left ventricle: LVEF is approximately 50 to 55% with very mild inferior and septal hypokinesis  02/08/13: Echocardiogram Study Conclusions - Left ventricle: The cavity size was mildly dilated. Wall thickness was increased in a pattern of moderate LVH. Systolic function was severely reduced. The estimated ejection fraction was in the range of 25% to 30%. - Left atrium: The atrium was mildly dilated.  Recent Labs: 11/11/2016: ALT 19; B Natriuretic Peptide 1,473.6 11/13/2016: Magnesium 1.7 11/14/2016: Hemoglobin 10.2 02/08/2017: BUN 16; Creatinine, Ser 1.02; Platelets 181; Potassium 3.9; Sodium 144 02/23/2017: TSH 5.86  02/19/2017: Cholesterol 158; HDL 46; LDL Cholesterol 93; Total CHOL/HDL Ratio 3.4; Triglycerides 95; VLDL 19   CrCl cannot be calculated (Patient's most recent lab result is older than the maximum 21 days allowed.).     Wt Readings from Last 3 Encounters:  03/10/17 254 lb (115.2 kg)  02/19/17 253 lb 9.6 oz (115 kg)  02/08/17 256 lb (116.1 kg)     Other studies reviewed: Additional studies/records reviewed today include: summarized above  ASSESSMENT AND PLAN:  1. PAFib  CHADS2Vasc is at least 3 on Pradaxa, counseled on the importance of taking routinely and as directed     Denies any bleeding or signs of bleeding     11/18/16: Creat 1.02, (calc cl =104)     11/14/16 H/H 10/32     Off amiodarone with concerns of toxicity with neurological symptoms that resolved off it, thyroid tox following with endo, 12/21/16 TSH 2.23  Will plan for DCCV given she reports no missed doses of her Pradaxa in the last 4 weeks, she is reminded the importance of this.  She is pending referral to a new endocrinologist, last TSH done via PMD was minimally elevated, off tx (previously very low <0.010)   2. HTN     Elevated today, the patient blames her insomnia last night, reports usually much better, last couple visits has been better  3. Tremor, gait discturbance     Remains resolved off amio     c/w neurology   4. NICM    EF back to 25%    On BB/ARB, diuretic    Will pursue DCCV in effort to resume SR, if we are able to maintain SR, hopefully her EF will recover    Monitor weight/edema at home    Today's edema is less then yesterday by her reprort, weight looks stable  Discussed with the patient the importance of healthy eating habits and taking her medicines as prescribed.    Disposition: resume Coreg 25mg  BID, DCCV, 3 months ROV, sooner if needed.  Current medicines are reviewed at length with the patient today.  The patient did not have any concerns regarding medicines.  Haywood Lasso, PA-C 03/10/2017 2:01 PM     [address] [place] [address] [phone] (office)  [phone] (fax)

## 2017-03-10 ENCOUNTER — Ambulatory Visit (INDEPENDENT_AMBULATORY_CARE_PROVIDER_SITE_OTHER): Payer: Medicare Other | Admitting: Physician Assistant

## 2017-03-10 ENCOUNTER — Encounter: Payer: Self-pay | Admitting: *Deleted

## 2017-03-10 VITALS — BP 144/94 | HR 90 | Ht 63.0 in | Wt 254.0 lb

## 2017-03-10 DIAGNOSIS — I10 Essential (primary) hypertension: Secondary | ICD-10-CM

## 2017-03-10 DIAGNOSIS — I481 Persistent atrial fibrillation: Secondary | ICD-10-CM

## 2017-03-10 DIAGNOSIS — I428 Other cardiomyopathies: Secondary | ICD-10-CM

## 2017-03-10 DIAGNOSIS — I4819 Other persistent atrial fibrillation: Secondary | ICD-10-CM

## 2017-03-10 NOTE — Patient Instructions (Addendum)
Medication Instructions:   MAKE SURE YOU TAKE COREG 25 MG TWICE DAY  MAKE SURE YOUR TAKING METOPROLOL 50 MG  ONCE A DAY    MAKE SURE YOU DO NOT MISS ANY PRADAXA  DOSES  If you need a refill on your cardiac medications before your next appointment, please call your pharmacy.  Labwork: NONE ORDERED  TODAY   Testing/Procedures: CARDIOVERSION 03-24-2017.Marland KitchenWITH DR TURNER SEE LETTER   Follow-Up: IN 3 MONTHS WITH DR [name] OR [facility]   Any Other Special Instructions Will Be Listed Below (If Applicable).

## 2017-03-10 NOTE — Addendum Note (Signed)
Addended by: Claude Manges on: 03/10/2017 03:19 PM   Modules accepted: Orders

## 2017-03-14 ENCOUNTER — Other Ambulatory Visit (HOSPITAL_COMMUNITY): Payer: Self-pay | Admitting: Nurse Practitioner

## 2017-03-19 ENCOUNTER — Other Ambulatory Visit: Payer: Medicare Other | Admitting: *Deleted

## 2017-03-19 DIAGNOSIS — I481 Persistent atrial fibrillation: Secondary | ICD-10-CM | POA: Diagnosis not present

## 2017-03-19 DIAGNOSIS — Z1231 Encounter for screening mammogram for malignant neoplasm of breast: Secondary | ICD-10-CM | POA: Diagnosis not present

## 2017-03-19 DIAGNOSIS — I4819 Other persistent atrial fibrillation: Secondary | ICD-10-CM

## 2017-03-19 LAB — HM MAMMOGRAPHY

## 2017-03-20 LAB — BASIC METABOLIC PANEL
BUN/Creatinine Ratio: 24 (ref 12–28)
BUN: 25 mg/dL (ref 8–27)
CALCIUM: 9.6 mg/dL (ref 8.7–10.3)
CHLORIDE: 102 mmol/L (ref 96–106)
CO2: 22 mmol/L (ref 18–29)
Creatinine, Ser: 1.04 mg/dL — ABNORMAL HIGH (ref 0.57–1.00)
GFR, EST AFRICAN AMERICAN: 66 mL/min/{1.73_m2} (ref >59)
GFR, EST NON AFRICAN AMERICAN: 57 mL/min/{1.73_m2} — AB (ref >59)
Glucose: 115 mg/dL — ABNORMAL HIGH (ref 65–99)
POTASSIUM: 4.2 mmol/L (ref 3.5–5.2)
Sodium: 140 mmol/L (ref 134–144)

## 2017-03-20 LAB — CBC
HEMATOCRIT: 36.2 % (ref 34.0–46.6)
Hemoglobin: 12 g/dL (ref 11.1–15.9)
MCH: 30.9 pg (ref 26.6–33.0)
MCHC: 33.1 g/dL (ref 31.5–35.7)
MCV: 93 fL (ref 79–97)
Platelets: 165 10*3/uL (ref 150–379)
RBC: 3.88 x10E6/uL (ref 3.77–5.28)
RDW: 14.8 % (ref 12.3–15.4)
WBC: 5.7 10*3/uL (ref 3.4–10.8)

## 2017-03-22 ENCOUNTER — Telehealth: Payer: Self-pay | Admitting: Medical

## 2017-03-22 NOTE — Telephone Encounter (Signed)
Pt was notified.  

## 2017-03-22 NOTE — Telephone Encounter (Signed)
I am happy to report that her mammogram was normal, no worrisome findings.

## 2017-03-23 ENCOUNTER — Other Ambulatory Visit: Payer: Medicare Other

## 2017-03-24 ENCOUNTER — Ambulatory Visit (HOSPITAL_COMMUNITY): Payer: Medicare Other | Admitting: Anesthesiology

## 2017-03-24 ENCOUNTER — Encounter (HOSPITAL_COMMUNITY): Payer: Self-pay | Admitting: *Deleted

## 2017-03-24 ENCOUNTER — Encounter (HOSPITAL_COMMUNITY)
Admission: RE | Disposition: A | Discharge status: Home / Self Care | Payer: Self-pay | Source: Ambulatory Visit | Attending: Cardiology

## 2017-03-24 ENCOUNTER — Encounter: Payer: Self-pay | Admitting: Medical

## 2017-03-24 ENCOUNTER — Ambulatory Visit (HOSPITAL_COMMUNITY)
Admission: RE | Admit: 2017-03-24 | Discharge: 2017-03-24 | Disposition: A | Discharge status: Home / Self Care | Payer: Medicare Other | Source: Ambulatory Visit | Attending: Cardiology | Admitting: Cardiology

## 2017-03-24 DIAGNOSIS — Z90710 Acquired absence of both cervix and uterus: Secondary | ICD-10-CM | POA: Insufficient documentation

## 2017-03-24 DIAGNOSIS — I87309 Chronic venous hypertension (idiopathic) without complications of unspecified lower extremity: Secondary | ICD-10-CM | POA: Insufficient documentation

## 2017-03-24 DIAGNOSIS — Z86010 Personal history of colonic polyps: Secondary | ICD-10-CM | POA: Diagnosis not present

## 2017-03-24 DIAGNOSIS — M109 Gout, unspecified: Secondary | ICD-10-CM | POA: Insufficient documentation

## 2017-03-24 DIAGNOSIS — Z820 Family history of epilepsy and other diseases of the nervous system: Secondary | ICD-10-CM | POA: Insufficient documentation

## 2017-03-24 DIAGNOSIS — I429 Cardiomyopathy, unspecified: Secondary | ICD-10-CM | POA: Insufficient documentation

## 2017-03-24 DIAGNOSIS — D649 Anemia, unspecified: Secondary | ICD-10-CM | POA: Diagnosis not present

## 2017-03-24 DIAGNOSIS — E785 Hyperlipidemia, unspecified: Secondary | ICD-10-CM | POA: Diagnosis not present

## 2017-03-24 DIAGNOSIS — G4700 Insomnia, unspecified: Secondary | ICD-10-CM | POA: Diagnosis not present

## 2017-03-24 DIAGNOSIS — F329 Major depressive disorder, single episode, unspecified: Secondary | ICD-10-CM | POA: Diagnosis not present

## 2017-03-24 DIAGNOSIS — Z7901 Long term (current) use of anticoagulants: Secondary | ICD-10-CM | POA: Insufficient documentation

## 2017-03-24 DIAGNOSIS — R7302 Impaired glucose tolerance (oral): Secondary | ICD-10-CM | POA: Insufficient documentation

## 2017-03-24 DIAGNOSIS — I2510 Atherosclerotic heart disease of native coronary artery without angina pectoris: Secondary | ICD-10-CM | POA: Diagnosis not present

## 2017-03-24 DIAGNOSIS — Z6841 Body Mass Index (BMI) 40.0 and over, adult: Secondary | ICD-10-CM | POA: Diagnosis not present

## 2017-03-24 DIAGNOSIS — D869 Sarcoidosis, unspecified: Secondary | ICD-10-CM | POA: Insufficient documentation

## 2017-03-24 DIAGNOSIS — K589 Irritable bowel syndrome without diarrhea: Secondary | ICD-10-CM | POA: Diagnosis not present

## 2017-03-24 DIAGNOSIS — Z825 Family history of asthma and other chronic lower respiratory diseases: Secondary | ICD-10-CM | POA: Insufficient documentation

## 2017-03-24 DIAGNOSIS — Z809 Family history of malignant neoplasm, unspecified: Secondary | ICD-10-CM | POA: Diagnosis not present

## 2017-03-24 DIAGNOSIS — Z79899 Other long term (current) drug therapy: Secondary | ICD-10-CM | POA: Diagnosis not present

## 2017-03-24 DIAGNOSIS — I483 Typical atrial flutter: Secondary | ICD-10-CM | POA: Diagnosis not present

## 2017-03-24 DIAGNOSIS — I481 Persistent atrial fibrillation: Secondary | ICD-10-CM | POA: Diagnosis not present

## 2017-03-24 DIAGNOSIS — I5032 Chronic diastolic (congestive) heart failure: Secondary | ICD-10-CM | POA: Insufficient documentation

## 2017-03-24 DIAGNOSIS — I110 Hypertensive heart disease with heart failure: Secondary | ICD-10-CM | POA: Diagnosis not present

## 2017-03-24 DIAGNOSIS — Z9049 Acquired absence of other specified parts of digestive tract: Secondary | ICD-10-CM | POA: Diagnosis not present

## 2017-03-24 DIAGNOSIS — R251 Tremor, unspecified: Secondary | ICD-10-CM | POA: Insufficient documentation

## 2017-03-24 DIAGNOSIS — Z8249 Family history of ischemic heart disease and other diseases of the circulatory system: Secondary | ICD-10-CM | POA: Insufficient documentation

## 2017-03-24 DIAGNOSIS — Z9114 Patient's other noncompliance with medication regimen: Secondary | ICD-10-CM | POA: Insufficient documentation

## 2017-03-24 DIAGNOSIS — J45909 Unspecified asthma, uncomplicated: Secondary | ICD-10-CM | POA: Insufficient documentation

## 2017-03-24 DIAGNOSIS — R269 Unspecified abnormalities of gait and mobility: Secondary | ICD-10-CM | POA: Insufficient documentation

## 2017-03-24 DIAGNOSIS — I4892 Unspecified atrial flutter: Secondary | ICD-10-CM | POA: Diagnosis not present

## 2017-03-24 HISTORY — PX: CARDIOVERSION: SHX1299

## 2017-03-24 LAB — POCT I-STAT, CHEM 8
BUN: 27 mg/dL — ABNORMAL HIGH (ref 6–20)
CALCIUM ION: 1.17 mmol/L (ref 1.15–1.40)
Chloride: 105 mmol/L (ref 101–111)
Creatinine, Ser: 1.1 mg/dL — ABNORMAL HIGH (ref 0.44–1.00)
GLUCOSE: 135 mg/dL — AB (ref 65–99)
HCT: 37 % (ref 36.0–46.0)
HEMOGLOBIN: 12.6 g/dL (ref 12.0–15.0)
Potassium: 3.8 mmol/L (ref 3.5–5.1)
Sodium: 142 mmol/L (ref 135–145)
TCO2: 29 mmol/L (ref 0–100)

## 2017-03-24 SURGERY — CARDIOVERSION
Anesthesia: General

## 2017-03-24 MED ORDER — SODIUM CHLORIDE 0.9 % IV SOLN
INTRAVENOUS | Status: DC | PRN
  Administered 2017-03-24: 14:00:00 via INTRAVENOUS
  Administered 2017-03-24: 500 mL

## 2017-03-24 MED ORDER — EPHEDRINE SULFATE 50 MG/ML IJ SOLN
INTRAMUSCULAR | Status: DC | PRN
  Administered 2017-03-24 (×2): 10 mg via INTRAVENOUS

## 2017-03-24 MED ORDER — PROPOFOL 10 MG/ML IV BOLUS
INTRAVENOUS | Status: DC | PRN
  Administered 2017-03-24: 100 mg via INTRAVENOUS
  Administered 2017-03-24: 50 mg via INTRAVENOUS

## 2017-03-24 MED ORDER — LIDOCAINE 2% (20 MG/ML) 5 ML SYRINGE
INTRAMUSCULAR | Status: DC | PRN
  Administered 2017-03-24: 80 mg via INTRAVENOUS

## 2017-03-24 NOTE — Transfer of Care (Signed)
Immediate Anesthesia Transfer of Care Note  Patient: Stephanie [name]  Procedure(s) Performed: Procedure(s): CARDIOVERSION (N/A)  Patient Location: Endoscopy Unit  Anesthesia Type:General  Level of Consciousness: drowsy and patient cooperative  Airway & Oxygen Therapy: Patient Spontanous Breathing  Post-op Assessment: Report given to RN, Post -op Vital signs reviewed and stable and Patient moving all extremities X 4  Post vital signs: Reviewed and stable  Last Vitals:  Vitals:   03/24/17 1257 03/24/17 1416  BP: (!) 137/109 106/75  Pulse: 85 63  Resp: 20 16  Temp: 36.5 C     Last Pain:  Vitals:   03/24/17 1257  TempSrc: Oral         Complications: No apparent anesthesia complications

## 2017-03-24 NOTE — Anesthesia Postprocedure Evaluation (Signed)
Anesthesia Post Note  Patient: Stephanie [name]  Procedure(s) Performed: Procedure(s) (LRB): CARDIOVERSION (N/A)     Patient location during evaluation: PACU Anesthesia Type: General Level of consciousness: awake and alert Pain management: pain level controlled Vital Signs Assessment: post-procedure vital signs reviewed and stable Respiratory status: spontaneous breathing, nonlabored ventilation and respiratory function stable Cardiovascular status: blood pressure returned to baseline and stable Postop Assessment: no signs of nausea or vomiting Anesthetic complications: no    Last Vitals:  Vitals:   03/24/17 1416 03/24/17 1430  BP: 106/75 117/76  Pulse: 63 64  Resp: 16 20  Temp:      Last Pain:  Vitals:   03/24/17 1257  TempSrc: Oral                 Lynda Rainwater

## 2017-03-24 NOTE — H&P (View-Only) (Signed)
Cardiology Office Note Date:  03/10/2017  Patient ID:  Stephanie [name] Mar 15, 1951, MRN [id number] PCP:  Carlena Hurl, PA-C  Electrophysiologist: Dr. [name] Neuology: Dr. Brett Fairy Gastroenterology: Dr. Deatra Ina   Chief Complaint:   History of Present Illness: Stephanie [name] is a 66 y.o. female with history of PAFib, sarcoidosis (eye), IBS, HLD, No obst CAD by cath in 2010, recurrent NICM felt 2/2 tachycardia/AF.  She comes in today to be seen for Dr. [name], last seen by him 01/15/17, at that visit he discussed unintentional medication non-compliance with reports of confusion (?side effect of her methimazole) she had apperant GI intolerances to her thyroid medicine as well and this impacted her overall intake and suspected confusion.  She saw endo a couple weeks ago and resumed on 1/2 dose.  Dr. [name] recommended to resume her Pradaxa BID and plan for DCCV, temporarily increased her lasix and BB.  She was seen by myself in f/u last month off her Methimazole, she stated she can not tolerate this medicine, as soon as she got back on it she was sick to her stomach, felt bloated and thinks there is a component to he LE swelling related to it as well.  She was 811% certain that her nausea and GI symptoms clearly are connected to there thyroid medicine and can not take it. She had DOE and needs to pace herself but no rest SOB, no symptoms of PND or orthopnea, says she takes occassionally her medicine at night for trouble sleeping, but not routinely.  DCCV was not pursued, unfortunately she missed 2 days of her Pradaxa secondary to the GI upset, and since stopping the thyroid medicine this is better and she is back on her other medicines as directed, though I was not convinced she is certain about all of them.  She is certain she is taking both coreg and metoprolol, specifically from Dr. [name].  She comes in today feeling so much better off the Methimazole, she is pending referral to a new  endocrinologist.  She denies any kind of CP, no overt palpitations, no dizziness, near syncope or syncope,  No SOB.  She thinks her BP is elevated today because last night late she drank a strong Ice tea and had ice cream, these kept her up all night and she hasntr slept.  She has some edema today, she says is better the yesterday and attributes this to not having been up and around to much yesterday, gets swollen when seated for a bit.  She assures me she has not missed a single dose of her Pradaxa since her last visit a month ago, has been very religious about it.  She denies any bleeding or signs of bleeding.  She has been inadvertently taking 2 coreg tablets BID though.    Her AF history: --DCCV 2010, May 2014 with raid return of AF then sponatenous conversion to SR --March 2015 discussed with Dr. Rayann Heman possible ablation but did not want to persue this, her amio was reduced to 200mg  daily and recommended lifestyle adjustments --Amiodarone ultimately stopped with concerns of possible toxicity with neuro symptoms of staggering/tremor with improvement off as well as hyperthyroidism  -- CM felt to be AF/rate related 25% IN 2012, that is improved to 50-55% in 2014>> Dec 2017 again 25% -- Mild abnormal sleep study no indication for CPAP 2014   Past Medical History:  Diagnosis Date  . Anemia   . Ankle fracture, right    x 2  . Antineoplastic  and immunosuppressive drugs causing adverse effect in therapeutic use   . Asthma   . Atrial fibrillation ([place])   . Chronic venous hypertension without complications   . Colon polyps   . Colon polyps 2009  . Coronary artery disease    no CAD by cath 11.2010  . Depressive disorder, not elsewhere classified   . Diastolic CHF, chronic ([place])   . Diverticulosis of colon 2009   colonoscopy  . Dyslipidemia   . Glucose intolerance (impaired glucose tolerance)    hgb AIC 6.1 09/02/2009  . Gout   . Hepatitis, unspecified    hx of  . Hx of hysterectomy 2002    . Hyperlipidemia   . Hypertension   . Irritable bowel syndrome   . Lung nodule    70mm RLL nodule on CT Chest  07/2009, resolved by 2011 CT  . Noncompliance   . Obesity   . Persistent atrial fibrillation ([place])    s/p DCCV 07/2009; Pradaxa Rx  . PONV (postoperative nausea and vomiting)   . Sarcoidosis    dx by eye exam  . Sleep disturbance 06/2013   sleep study, mild abnormality, no criteria for CPAP    Past Surgical History:  Procedure Laterality Date  . ABDOMINAL HYSTERECTOMY    . CARDIOVERSION     x 2  . CARDIOVERSION N/A 02/27/2013   Procedure: CARDIOVERSION;  Surgeon: Lelon Perla, MD;  Location: [facility] ENDOSCOPY;  Service: Cardiovascular;  Laterality: N/A;  . CHOLECYSTECTOMY    . COLONOSCOPY     Diverticulosis, colon polyps, repeat 2014, Dr. Deatra Ina  . ESOPHAGOGASTRODUODENOSCOPY N/A 05/16/2014   Procedure: ESOPHAGOGASTRODUODENOSCOPY (EGD);  Surgeon: Wonda Horner, MD;  Location: [facility] ORS;  Service: Gastroenterology;  Laterality: N/A;  . HERNIA REPAIR  2009    Current Outpatient Prescriptions  Medication Sig Dispense Refill  . allopurinol (ZYLOPRIM) 300 MG tablet TAKE 300 MG BY MOUTH DAILY AS NEEDED FOR GOUT 90 tablet 2  . ALPRAZolam (XANAX) 1 MG tablet Take 1 tablet (1 mg total) by mouth at bedtime as needed for anxiety. 45 tablet 2  . budesonide-formoterol (SYMBICORT) 160-4.5 MCG/ACT inhaler Inhale 2 puffs into the lungs 2 (two) times daily. 1 Inhaler 0  . carvedilol (COREG) 25 MG tablet Take 1 tablet (25 mg total) by mouth 2 (two) times daily. (Patient taking differently: Take 50 mg by mouth 2 (two) times daily. ) 60 tablet 11  . colchicine (COLCRYS) 0.6 MG tablet Take 1 tablet (0.6 mg total) by mouth daily as needed. Reported on 01/21/2016 (Patient taking differently: Take 0.6 mg by mouth daily as needed (GOUT). Reported on 01/21/2016) 45 tablet 1  . diphenoxylate-atropine (LOMOTIL) 2.5-0.025 MG tablet Take 1 tablet by mouth every 8 (eight) hours as needed for diarrhea or loose  stools. 30 tablet 0  . furosemide (LASIX) 40 MG tablet Take 1 tablet (40 mg total) by mouth daily. 30 tablet 0  . metoprolol succinate (TOPROL XL) 25 MG 24 hr tablet Take 2 tablets (50 mg total) by mouth daily.    . potassium chloride SA (K-DUR,KLOR-CON) 20 MEQ tablet Take 1 tablet (20 mEq total) by mouth daily. 30 tablet 0  . PRADAXA 150 MG CAPS capsule TAKE ONE CAPSULE BY MOUTH TWICE DAILY 60 capsule 5  . rosuvastatin (CRESTOR) 20 MG tablet Take 1 tablet (20 mg total) by mouth daily. 90 tablet 3  . traZODone (DESYREL) 50 MG tablet TAKE 1/2 TO 1 TABLET BY MOUTH EVERY NIGHT AT BEDTIME AS NEEDED FOR SLEEP 90  tablet 3  . VENTOLIN HFA 108 (90 Base) MCG/ACT inhaler INHALE 2 PUFFS INTO THE LUNGS EVERY 6 HOURS AS NEEDED FOR WHEEZING OR SHORTNESS OF BREATH 18 g 0  . losartan (COZAAR) 25 MG tablet Take 1 tablet (25 mg total) by mouth daily. 90 tablet 3   No current facility-administered medications for this visit.     Allergies:   Albuterol; Amiodarone; Atorvastatin; Livalo [pitavastatin]; and Propoxyphene n-acetaminophen   Social History:  The patient  reports that she has never smoked. She has never used smokeless tobacco. She reports that she drinks alcohol. She reports that she does not use drugs.   Family History:  The patient's family history includes Cancer in her father; Emphysema in her other; Hypertension in her father; Multiple sclerosis in her mother; Pneumonia in her father.  ROS:  Please see the history of present illness.   All other systems are reviewed and otherwise negative.   PHYSICAL EXAM:  VS:  BP (!) 144/94   Pulse 90   Ht 5\' 3"  (1.6 m)   Wt 254 lb (115.2 kg)   BMI 44.99 kg/m  BMI: Body mass index is 44.99 kg/m. Very plesant BF, obese, in no acute distress  HEENT: normocephalic, atraumatic  Neck: no JVD, carotid bruits or masses Cardiac:  IRRR; extrasystoles,  no significant murmurs, no rubs, or gallops Lungs: CTA b/l, no wheezing, rhonchi or rales  Abd: soft,  nontender MS: no deformity or atrophy Ext:  Trace edema b/l LE  Skin: warm and dry, no rash Neuro:  No gross deficits appreciated Psych: euthymic mood, full affect  EKG:  Done today and reviewed by myself: AFib/flutter, 90bpm, QTc is reported as long, difficult to assess in current rhythm, QRS 167ms, T changes nonspecific and similar to previous Done 01/15/17 AFlutter, 93bpm, QRS 155ms  10/18/16 TTE Study Conclusions - Left ventricle: The cavity size was mildly dilated. There was   moderate concentric hypertrophy. Systolic function was severely   reduced. The estimated ejection fraction was 25%. Diffuse   hypokinesis. The study is not technically sufficient to allow   evaluation of LV diastolic function. - Mitral valve: Calcified annulus. Mildly thickened leaflets .   There was mild regurgitation. - Left atrium: The atrium was severely dilated. (24mm) - Right ventricle: Systolic function was moderately reduced. - Right atrium: The atrium was mildly dilated. - Tricuspid valve: There was mild regurgitation.   05/29/13: Echocardiogram Study Conclusions Left ventricle: LVEF is approximately 50 to 55% with very mild inferior and septal hypokinesis  02/08/13: Echocardiogram Study Conclusions - Left ventricle: The cavity size was mildly dilated. Wall thickness was increased in a pattern of moderate LVH. Systolic function was severely reduced. The estimated ejection fraction was in the range of 25% to 30%. - Left atrium: The atrium was mildly dilated.  Recent Labs: 11/11/2016: ALT 19; B Natriuretic Peptide 1,473.6 11/13/2016: Magnesium 1.7 11/14/2016: Hemoglobin 10.2 02/08/2017: BUN 16; Creatinine, Ser 1.02; Platelets 181; Potassium 3.9; Sodium 144 02/23/2017: TSH 5.86  02/19/2017: Cholesterol 158; HDL 46; LDL Cholesterol 93; Total CHOL/HDL Ratio 3.4; Triglycerides 95; VLDL 19   CrCl cannot be calculated (Patient's most recent lab result is older than the maximum 21 days allowed.).     Wt Readings from Last 3 Encounters:  03/10/17 254 lb (115.2 kg)  02/19/17 253 lb 9.6 oz (115 kg)  02/08/17 256 lb (116.1 kg)     Other studies reviewed: Additional studies/records reviewed today include: summarized above  ASSESSMENT AND PLAN:  1. PAFib  CHADS2Vasc is at least 3 on Pradaxa, counseled on the importance of taking routinely and as directed     Denies any bleeding or signs of bleeding     11/18/16: Creat 1.02, (calc cl =104)     11/14/16 H/H 10/32     Off amiodarone with concerns of toxicity with neurological symptoms that resolved off it, thyroid tox following with endo, 12/21/16 TSH 2.23  Will plan for DCCV given she reports no missed doses of her Pradaxa in the last 4 weeks, she is reminded the importance of this.  She is pending referral to a new endocrinologist, last TSH done via PMD was minimally elevated, off tx (previously very low <0.010)   2. HTN     Elevated today, the patient blames her insomnia last night, reports usually much better, last couple visits has been better  3. Tremor, gait discturbance     Remains resolved off amio     c/w neurology   4. NICM    EF back to 25%    On BB/ARB, diuretic    Will pursue DCCV in effort to resume SR, if we are able to maintain SR, hopefully her EF will recover    Monitor weight/edema at home    Today's edema is less then yesterday by her reprort, weight looks stable  Discussed with the patient the importance of healthy eating habits and taking her medicines as prescribed.    Disposition: resume Coreg 25mg  BID, DCCV, 3 months ROV, sooner if needed.  Current medicines are reviewed at length with the patient today.  The patient did not have any concerns regarding medicines.  Haywood Lasso, PA-C 03/10/2017 2:01 PM     [address] [place] [address] [phone] (office)  [phone] (fax)

## 2017-03-24 NOTE — Interval H&P Note (Signed)
History and Physical Interval Note:  03/24/2017 2:07 PM  Stephanie [name]  has presented today for surgery, with the diagnosis of AFLUTTER  The various methods of treatment have been discussed with the patient and family. After consideration of risks, benefits and other options for treatment, the patient has consented to  Procedure(s): CARDIOVERSION (N/A) as a surgical intervention .  The patient's history has been reviewed, patient examined, no change in status, stable for surgery.  I have reviewed the patient's chart and labs.  Questions were answered to the patient's satisfaction.     Jenkins Rouge

## 2017-03-24 NOTE — Discharge Instructions (Signed)
Electrical Cardioversion, Care After °This sheet gives you information about how to care for yourself after your procedure. Your health care provider may also give you more specific instructions. If you have problems or questions, contact your health care provider. °What can I expect after the procedure? °After the procedure, it is common to have: °· Some redness on the skin where the shocks were given. ° °Follow these instructions at home: °· Do not drive for 24 hours if you were given a medicine to help you relax (sedative). °· Take over-the-counter and prescription medicines only as told by your health care provider. °· Ask your health care provider how to check your pulse. Check it often. °· Rest for 48 hours after the procedure or as told by your health care provider. °· Avoid or limit your caffeine use as told by your health care provider. °Contact a health care provider if: °· You feel like your heart is beating too quickly or your pulse is not regular. °· You have a serious muscle cramp that does not go away. °Get help right away if: °· You have discomfort in your chest. °· You are dizzy or you feel faint. °· You have trouble breathing or you are short of breath. °· Your speech is slurred. °· You have trouble moving an arm or leg on one side of your body. °· Your fingers or toes turn cold or blue. °This information is not intended to replace advice given to you by your health care provider. Make sure you discuss any questions you have with your health care provider. °Document Released: 07/26/2013 Document Revised: 05/08/2016 Document Reviewed: 04/10/2016 °Elsevier Interactive Patient Education © 2018 [employer] ° °

## 2017-03-24 NOTE — Anesthesia Preprocedure Evaluation (Signed)
Anesthesia Evaluation  Patient identified by MRN, date of birth, ID band Patient awake    Reviewed: Allergy & Precautions, H&P , NPO status , Patient's Chart, lab work & pertinent test results, reviewed documented beta blocker date and time   History of Anesthesia Complications (+) PONV  Airway Mallampati: II  TM Distance: >3 FB Neck ROM: Full    Dental no notable dental hx.    Pulmonary  sarcoidosis   Pulmonary exam normal breath sounds clear to auscultation       Cardiovascular hypertension, (-) CAD Normal cardiovascular exam+ dysrhythmias Atrial Fibrillation  Rhythm:Regular Rate:Normal     Neuro/Psych Anxiety Depression negative neurological ROS  negative psych ROS   GI/Hepatic negative GI ROS, Neg liver ROS,   Endo/Other  Morbid obesity  Renal/GU negative Renal ROS  negative genitourinary   Musculoskeletal negative musculoskeletal ROS (+)   Abdominal   Peds negative pediatric ROS (+)  Hematology negative hematology ROS (+)   Anesthesia Other Findings   Reproductive/Obstetrics negative OB ROS                             Anesthesia Physical  Anesthesia Plan  ASA: II  Anesthesia Plan: General   Post-op Pain Management:    Induction: Intravenous  PONV Risk Score and Plan: 4 or greater and Ondansetron, Dexamethasone, Propofol, Midazolam, Scopolamine patch - Pre-op and Treatment may vary due to age  Airway Management Planned: Oral ETT and Mask  Additional Equipment:   Intra-op Plan:   Post-operative Plan: Extubation in OR  Informed Consent: I have reviewed the patients History and Physical, chart, labs and discussed the procedure including the risks, benefits and alternatives for the proposed anesthesia with the patient or authorized representative who has indicated his/her understanding and acceptance.   Dental advisory given  Plan Discussed with: CRNA  Anesthesia  Plan Comments:         Anesthesia Quick Evaluation

## 2017-03-24 NOTE — CV Procedure (Signed)
[place]: Anesthesia;  [place] Propofol/Lidocaine On Rx pradaxa with no missed doses  DCC 120 J converted from flutter rate 122 to NSR rate 55 went back into Flutter 3 minutes latter  [place] 150J converted NSR rate 55  No immediate neurologic sequelae  Jenkins Rouge

## 2017-03-25 ENCOUNTER — Telehealth (HOSPITAL_COMMUNITY): Payer: Self-pay | Admitting: *Deleted

## 2017-03-25 NOTE — Telephone Encounter (Signed)
-----   Message from Juluis Mire, RN sent at 03/24/2017  4:29 PM EDT ----- F/u dccv per renee

## 2017-03-25 NOTE — Telephone Encounter (Signed)
appt made for 6/19 per pt request.

## 2017-03-25 NOTE — Telephone Encounter (Signed)
Unable to leave message mailbox full. Will attempt to reach pt at later time. Quick follow up post cardioversion in afib clinic was requested.

## 2017-03-30 ENCOUNTER — Ambulatory Visit (INDEPENDENT_AMBULATORY_CARE_PROVIDER_SITE_OTHER): Payer: Medicare Other | Admitting: Medical

## 2017-03-30 ENCOUNTER — Encounter: Payer: Self-pay | Admitting: Medical

## 2017-03-30 VITALS — BP 118/78 | HR 67 | Wt 258.4 lb

## 2017-03-30 DIAGNOSIS — E790 Hyperuricemia without signs of inflammatory arthritis and tophaceous disease: Secondary | ICD-10-CM | POA: Diagnosis not present

## 2017-03-30 DIAGNOSIS — E0590 Thyrotoxicosis, unspecified without thyrotoxic crisis or storm: Secondary | ICD-10-CM | POA: Diagnosis not present

## 2017-03-30 DIAGNOSIS — E118 Type 2 diabetes mellitus with unspecified complications: Secondary | ICD-10-CM

## 2017-03-30 DIAGNOSIS — M1A9XX0 Chronic gout, unspecified, without tophus (tophi): Secondary | ICD-10-CM

## 2017-03-30 DIAGNOSIS — K529 Noninfective gastroenteritis and colitis, unspecified: Secondary | ICD-10-CM

## 2017-03-30 LAB — TSH: TSH: 7.42 m[IU]/L — AB

## 2017-03-30 LAB — T4, FREE: Free T4: 1.2 ng/dL (ref 0.8–1.8)

## 2017-03-30 NOTE — Progress Notes (Signed)
Subjective: Chief Complaint  Patient presents with  . medicare    med check,   Here for f/u and med check. Last visit uric acid was elevated over 10.  She has restarted Allopurinol.  Doing ok on this.  Since last visit she contacted GI about repeat colonoscopy, but was told to wait until 2019.  She recently had mammogram done.    She had recent cardioversion, has rash on left upper chest.    Takes Lasix most days but not every day.    compliant with medications.   Past Medical History:  Diagnosis Date  . Anemia   . Ankle fracture, right    x 2  . Antineoplastic and immunosuppressive drugs causing adverse effect in therapeutic use   . Asthma   . Atrial fibrillation ([place])   . Chronic venous hypertension without complications   . Colon polyps   . Colon polyps 2009  . Coronary artery disease    no CAD by cath 11.2010  . Depressive disorder, not elsewhere classified   . Diastolic CHF, chronic ([place])   . Diverticulosis of colon 2009   colonoscopy  . Dyslipidemia   . Glucose intolerance (impaired glucose tolerance)    hgb AIC 6.1 09/02/2009  . Gout   . Hepatitis, unspecified    hx of  . Hx of hysterectomy 2002  . Hyperlipidemia   . Hypertension   . Irritable bowel syndrome   . Lung nodule    9mm RLL nodule on CT Chest  07/2009, resolved by 2011 CT  . Noncompliance   . Obesity   . Persistent atrial fibrillation ([place])    s/p DCCV 07/2009; Pradaxa Rx  . PONV (postoperative nausea and vomiting)   . Sarcoidosis    dx by eye exam  . Sleep disturbance 06/2013   sleep study, mild abnormality, no criteria for CPAP   Current Outpatient Prescriptions on File Prior to Visit  Medication Sig Dispense Refill  . allopurinol (ZYLOPRIM) 300 MG tablet TAKE 300 MG BY MOUTH DAILY AS NEEDED FOR GOUT 90 tablet 2  . ALPRAZolam (XANAX) 1 MG tablet Take 1 tablet (1 mg total) by mouth at bedtime as needed for anxiety. 45 tablet 2  . budesonide-formoterol (SYMBICORT) 160-4.5 MCG/ACT inhaler  Inhale 2 puffs into the lungs 2 (two) times daily. 1 Inhaler 0  . carvedilol (COREG) 25 MG tablet Take 1 tablet (25 mg total) by mouth 2 (two) times daily. 60 tablet 11  . colchicine (COLCRYS) 0.6 MG tablet Take 1 tablet (0.6 mg total) by mouth daily as needed. Reported on 01/21/2016 (Patient taking differently: Take 0.6 mg by mouth daily as needed (GOUT). Reported on 01/21/2016) 45 tablet 1  . furosemide (LASIX) 40 MG tablet Take 1 tablet (40 mg total) by mouth daily. 30 tablet 0  . metoprolol succinate (TOPROL XL) 25 MG 24 hr tablet Take 2 tablets (50 mg total) by mouth daily.    . potassium chloride SA (K-DUR,KLOR-CON) 20 MEQ tablet Take 1 tablet (20 mEq total) by mouth daily. 30 tablet 0  . PRADAXA 150 MG CAPS capsule TAKE ONE CAPSULE BY MOUTH TWICE DAILY 60 capsule 5  . rosuvastatin (CRESTOR) 20 MG tablet Take 1 tablet (20 mg total) by mouth daily. 90 tablet 3  . traZODone (DESYREL) 50 MG tablet TAKE 1/2 TO 1 TABLET BY MOUTH EVERY NIGHT AT BEDTIME AS NEEDED FOR SLEEP 90 tablet 3  . VENTOLIN HFA 108 (90 Base) MCG/ACT inhaler INHALE 2 PUFFS INTO THE LUNGS EVERY 6  HOURS AS NEEDED FOR WHEEZING OR SHORTNESS OF BREATH 18 g 0  . losartan (COZAAR) 25 MG tablet Take 1 tablet (25 mg total) by mouth daily. 90 tablet 3  . [DISCONTINUED] propranolol (INDERAL) 60 MG tablet Take 60 mg by mouth daily.       No current facility-administered medications on file prior to visit.     ROS as in subjective   Objective: BP 118/78   Pulse 67   Wt 258 lb 6.4 oz (117.2 kg)   SpO2 97%   BMI 45.77 kg/m   Gen: wd, wn, nad Lungs clear Neck supple, nontender, no mass, no thyromegaly Heart: irregularly irregular rhythm, no murmurs Ext: no edema    Assessment: Encounter Diagnoses  Name Primary?  . Chronic gout without tophus, unspecified cause, unspecified site Yes  . Elevated uric acid in blood   . Type 2 diabetes mellitus with complication, without long-term current use of insulin ([place])   . Chronic  diarrhea   . Thyrotoxicosis without thyroid storm, unspecified thyrotoxicosis type     Plan: Chronic gout - repeat uric acid today. Compliant with allopurinol  Diabetes - lab today.   discussed diet, exercise.  Persistent Afib - reviewed recent cardiology notes, hospitalization notes, 03/10/17 and 03/24/17 visits.  She is compliant with medication.  Chronic diarrhea - resolved  Thyrotoxicosis - repeat labs today.  Will likely need to refer back to endocrinology.    Jannetta was seen today for medicare.  Diagnoses and all orders for this visit:  Chronic gout without tophus, unspecified cause, unspecified site -     Uric acid  Elevated uric acid in blood -     Uric acid  Type 2 diabetes mellitus with complication, without long-term current use of insulin ([facility]) -     Hemoglobin A1c  Chronic diarrhea -     TSH -     T4, free -     T3  Thyrotoxicosis without thyroid storm, unspecified thyrotoxicosis type -     TSH -     T4, free -     T3

## 2017-03-31 ENCOUNTER — Other Ambulatory Visit: Payer: Self-pay | Admitting: Medical

## 2017-03-31 LAB — HEMOGLOBIN A1C
HEMOGLOBIN A1C: 7.1 % — AB (ref <5.7)
MEAN PLASMA GLUCOSE: 157 mg/dL

## 2017-03-31 LAB — T3: T3 TOTAL: 100 ng/dL (ref 76–181)

## 2017-03-31 LAB — URIC ACID: URIC ACID, SERUM: 4.8 mg/dL (ref 2.5–7.0)

## 2017-03-31 MED ORDER — ROSUVASTATIN CALCIUM 20 MG PO TABS: 20.0000 mg | ORAL_TABLET | Freq: Every day | ORAL | 3 refills | Status: DC

## 2017-03-31 MED ORDER — POTASSIUM CHLORIDE CRYS ER 20 MEQ PO TBCR: 20.0000 meq | EXTENDED_RELEASE_TABLET | Freq: Every day | ORAL | 3 refills | Status: DC

## 2017-03-31 MED ORDER — BUDESONIDE-FORMOTEROL FUMARATE 160-4.5 MCG/ACT IN AERO: 2.0000 | INHALATION_SPRAY | Freq: Two times a day (BID) | RESPIRATORY_TRACT | 2 refills | Status: DC

## 2017-03-31 MED ORDER — FUROSEMIDE 40 MG PO TABS: 40.0000 mg | ORAL_TABLET | Freq: Every day | ORAL | 3 refills | Status: DC

## 2017-03-31 NOTE — Progress Notes (Signed)
Thyroid not quite to goal.   Lets go ahead and refer to different endocrinologist for abnormal thyroid function, hx/o thyrotoxicosis, persistent afib.    Refer to Dr. Elyse Hsu or [place] or [place].  She wasn't happy with Dr. Letta Median.  I refilled Crestor, Symbicort.  She needs to be taking Lasix EVERY DAY along with potassium in the MORNING!  regarding her diabetes marker which is a little higher, instead of initiating any medication, I'd like to refer to nutritionist to help with diet for diabetes, heart disease.  Have her write down everything she eats or drinks for 5 days to take this info with her to that appt.

## 2017-04-05 DIAGNOSIS — Z86010 Personal history of colonic polyps: Secondary | ICD-10-CM | POA: Diagnosis not present

## 2017-04-05 DIAGNOSIS — I110 Hypertensive heart disease with heart failure: Secondary | ICD-10-CM | POA: Diagnosis not present

## 2017-04-05 DIAGNOSIS — I2510 Atherosclerotic heart disease of native coronary artery without angina pectoris: Secondary | ICD-10-CM | POA: Diagnosis not present

## 2017-04-05 DIAGNOSIS — D649 Anemia, unspecified: Secondary | ICD-10-CM | POA: Diagnosis not present

## 2017-04-05 DIAGNOSIS — I4892 Unspecified atrial flutter: Secondary | ICD-10-CM | POA: Diagnosis not present

## 2017-04-05 DIAGNOSIS — M109 Gout, unspecified: Secondary | ICD-10-CM | POA: Diagnosis not present

## 2017-04-05 DIAGNOSIS — I480 Paroxysmal atrial fibrillation: Secondary | ICD-10-CM | POA: Diagnosis not present

## 2017-04-05 DIAGNOSIS — E119 Type 2 diabetes mellitus without complications: Secondary | ICD-10-CM | POA: Diagnosis not present

## 2017-04-05 DIAGNOSIS — D519 Vitamin B12 deficiency anemia, unspecified: Secondary | ICD-10-CM | POA: Diagnosis not present

## 2017-04-05 DIAGNOSIS — I420 Dilated cardiomyopathy: Secondary | ICD-10-CM | POA: Diagnosis not present

## 2017-04-05 DIAGNOSIS — E669 Obesity, unspecified: Secondary | ICD-10-CM | POA: Diagnosis not present

## 2017-04-05 DIAGNOSIS — K589 Irritable bowel syndrome without diarrhea: Secondary | ICD-10-CM | POA: Diagnosis not present

## 2017-04-05 DIAGNOSIS — Z7951 Long term (current) use of inhaled steroids: Secondary | ICD-10-CM | POA: Diagnosis not present

## 2017-04-05 DIAGNOSIS — F329 Major depressive disorder, single episode, unspecified: Secondary | ICD-10-CM | POA: Diagnosis not present

## 2017-04-05 DIAGNOSIS — J45909 Unspecified asthma, uncomplicated: Secondary | ICD-10-CM | POA: Diagnosis not present

## 2017-04-05 DIAGNOSIS — I5042 Chronic combined systolic (congestive) and diastolic (congestive) heart failure: Secondary | ICD-10-CM | POA: Diagnosis not present

## 2017-04-05 DIAGNOSIS — F419 Anxiety disorder, unspecified: Secondary | ICD-10-CM | POA: Diagnosis not present

## 2017-04-06 ENCOUNTER — Ambulatory Visit (HOSPITAL_COMMUNITY)
Admission: RE | Admit: 2017-04-06 | Discharge: 2017-04-06 | Disposition: A | Discharge status: Home / Self Care | Payer: Medicare Other | Source: Ambulatory Visit | Attending: Nurse Practitioner | Admitting: Nurse Practitioner

## 2017-04-06 ENCOUNTER — Encounter (HOSPITAL_COMMUNITY): Payer: Self-pay | Admitting: Nurse Practitioner

## 2017-04-06 VITALS — BP 140/76 | HR 122 | Ht 63.0 in | Wt 257.8 lb

## 2017-04-06 DIAGNOSIS — E0590 Thyrotoxicosis, unspecified without thyrotoxic crisis or storm: Secondary | ICD-10-CM | POA: Diagnosis not present

## 2017-04-06 DIAGNOSIS — I2510 Atherosclerotic heart disease of native coronary artery without angina pectoris: Secondary | ICD-10-CM | POA: Diagnosis not present

## 2017-04-06 DIAGNOSIS — Z86010 Personal history of colonic polyps: Secondary | ICD-10-CM | POA: Diagnosis not present

## 2017-04-06 DIAGNOSIS — J45909 Unspecified asthma, uncomplicated: Secondary | ICD-10-CM | POA: Insufficient documentation

## 2017-04-06 DIAGNOSIS — K589 Irritable bowel syndrome without diarrhea: Secondary | ICD-10-CM | POA: Insufficient documentation

## 2017-04-06 DIAGNOSIS — E669 Obesity, unspecified: Secondary | ICD-10-CM | POA: Diagnosis not present

## 2017-04-06 DIAGNOSIS — I110 Hypertensive heart disease with heart failure: Secondary | ICD-10-CM | POA: Diagnosis not present

## 2017-04-06 DIAGNOSIS — F329 Major depressive disorder, single episode, unspecified: Secondary | ICD-10-CM | POA: Diagnosis not present

## 2017-04-06 DIAGNOSIS — Z90710 Acquired absence of both cervix and uterus: Secondary | ICD-10-CM | POA: Insufficient documentation

## 2017-04-06 DIAGNOSIS — E785 Hyperlipidemia, unspecified: Secondary | ICD-10-CM | POA: Diagnosis not present

## 2017-04-06 DIAGNOSIS — M109 Gout, unspecified: Secondary | ICD-10-CM | POA: Diagnosis not present

## 2017-04-06 DIAGNOSIS — I517 Cardiomegaly: Secondary | ICD-10-CM | POA: Insufficient documentation

## 2017-04-06 DIAGNOSIS — Z7901 Long term (current) use of anticoagulants: Secondary | ICD-10-CM | POA: Diagnosis not present

## 2017-04-06 DIAGNOSIS — I4819 Other persistent atrial fibrillation: Secondary | ICD-10-CM

## 2017-04-06 DIAGNOSIS — I481 Persistent atrial fibrillation: Secondary | ICD-10-CM

## 2017-04-06 DIAGNOSIS — D869 Sarcoidosis, unspecified: Secondary | ICD-10-CM | POA: Insufficient documentation

## 2017-04-06 DIAGNOSIS — I5032 Chronic diastolic (congestive) heart failure: Secondary | ICD-10-CM | POA: Insufficient documentation

## 2017-04-06 MED ORDER — METOPROLOL SUCCINATE ER 25 MG PO TB24
25.0000 mg | ORAL_TABLET | Freq: Two times a day (BID) | ORAL | 3 refills | Status: DC
Start: 2017-04-06 — End: 2017-05-05

## 2017-04-06 NOTE — Patient Instructions (Signed)
Your physician has recommended you make the following change in your medication:  1)Coreg 25mg  twice a day 2)Metoprolol 25mg  twice a day

## 2017-04-06 NOTE — Progress Notes (Signed)
Primary Care Physician: Carlena Hurl, PA-C Referring Physician: Loyal Jacobson, PA Cardiologist: Dr. [name]   Stephanie [name] is a 66 y.o. female with a h/o atrial fibrillation that had been treated with amiodarone that was stopped by neurology due to tremor and unsteady gait over a year ago. Seen initially in afib clinc 11/2016. According to the pt, she had been staying in [place] She has been in the hospital twice in the last month, initially for diarrhea and the last admission 11/11/16 for afib with rvr with fluid overload/CHF. She was found to have new onset hyperthyroidism and was started on tapazole. Her weight is stable and her son and daughter have come in from [place] and [place] to help care for their mother. She is due to see the endocrinologist tomorrow. The d/c instructions mentioned a cardioversion in 3- 4 weeks, or when her thyroid normalizes, to try to restore SR.  She recently had a cardioversion 6/6 but unfortunately it may have held only for around one day. She is here with aflutter with variable v rate at 122 bpm. She forgot to get her metoprolol refilled and has been off several days. She saw Endocrinology but did not like the MD. She still has thyroid levels that are not normalized but she has failed to f/u with endocrinology. She did see Dr. Rayann Heman in 2015 and pt deferred ablation. Pt states that it has been reported in her chart that she drinks alcohol but she denies current alcohol intake. Also reported is noncompliance and I have concerns re antiarrythmic's like Tikosyn or sotalol. Also qtc in SR that I have seen are all over 440 ms, she is on trazadone, that has potential for prolonging qtc.  Today, she denies symptoms of palpitations, chest pain, shortness of breath, orthopnea, PND, lower extremity edema, dizziness, presyncope, syncope, or neurologic sequela. The patient is tolerating medications without difficulties and is otherwise without complaint today.   Past Medical  History:  Diagnosis Date  . Anemia   . Ankle fracture, right    x 2  . Antineoplastic and immunosuppressive drugs causing adverse effect in therapeutic use   . Asthma   . Atrial fibrillation ([place])   . Chronic venous hypertension without complications   . Colon polyps   . Colon polyps 2009  . Coronary artery disease    no CAD by cath 11.2010  . Depressive disorder, not elsewhere classified   . Diastolic CHF, chronic ([place])   . Diverticulosis of colon 2009   colonoscopy  . Dyslipidemia   . Glucose intolerance (impaired glucose tolerance)    hgb AIC 6.1 09/02/2009  . Gout   . Hepatitis, unspecified    hx of  . Hx of hysterectomy 2002  . Hyperlipidemia   . Hypertension   . Irritable bowel syndrome   . Lung nodule    61mm RLL nodule on CT Chest  07/2009, resolved by 2011 CT  . Noncompliance   . Obesity   . Persistent atrial fibrillation ([place])    s/p DCCV 07/2009; Pradaxa Rx  . PONV (postoperative nausea and vomiting)   . Sarcoidosis    dx by eye exam  . Sleep disturbance 06/2013   sleep study, mild abnormality, no criteria for CPAP   Past Surgical History:  Procedure Laterality Date  . ABDOMINAL HYSTERECTOMY    . CARDIOVERSION     x 2  . CARDIOVERSION N/A 02/27/2013   Procedure: CARDIOVERSION;  Surgeon: Lelon Perla, MD;  Location: [facility] ENDOSCOPY;  Service: Cardiovascular;  Laterality: N/A;  . CARDIOVERSION N/A 03/24/2017   Procedure: CARDIOVERSION;  Surgeon: Josue Hector, MD;  Location: [facility] ENDOSCOPY;  Service: Cardiovascular;  Laterality: N/A;  . CHOLECYSTECTOMY    . COLONOSCOPY     Diverticulosis, colon polyps, repeat 2014, Dr. Deatra Ina  . ESOPHAGOGASTRODUODENOSCOPY N/A 05/16/2014   Procedure: ESOPHAGOGASTRODUODENOSCOPY (EGD);  Surgeon: Wonda Horner, MD;  Location: [facility] ORS;  Service: Gastroenterology;  Laterality: N/A;  . HERNIA REPAIR  2009    Current Outpatient Prescriptions  Medication Sig Dispense Refill  . allopurinol (ZYLOPRIM) 300 MG tablet TAKE 300 MG BY  MOUTH DAILY AS NEEDED FOR GOUT 90 tablet 2  . ALPRAZolam (XANAX) 1 MG tablet Take 1 tablet (1 mg total) by mouth at bedtime as needed for anxiety. 45 tablet 2  . budesonide-formoterol (SYMBICORT) 160-4.5 MCG/ACT inhaler Inhale 2 puffs into the lungs 2 (two) times daily. 1 Inhaler 2  . carvedilol (COREG) 25 MG tablet Take 1 tablet (25 mg total) by mouth 2 (two) times daily. 60 tablet 11  . colchicine (COLCRYS) 0.6 MG tablet Take 1 tablet (0.6 mg total) by mouth daily as needed. Reported on 01/21/2016 (Patient taking differently: Take 0.6 mg by mouth daily as needed (GOUT). Reported on 01/21/2016) 45 tablet 1  . furosemide (LASIX) 40 MG tablet Take 1 tablet (40 mg total) by mouth daily. 90 tablet 3  . losartan (COZAAR) 25 MG tablet Take 1 tablet (25 mg total) by mouth daily. 90 tablet 3  . potassium chloride SA (K-DUR,KLOR-CON) 20 MEQ tablet Take 1 tablet (20 mEq total) by mouth daily. 90 tablet 3  . PRADAXA 150 MG CAPS capsule TAKE ONE CAPSULE BY MOUTH TWICE DAILY 60 capsule 5  . rosuvastatin (CRESTOR) 20 MG tablet Take 1 tablet (20 mg total) by mouth daily. 90 tablet 3  . traZODone (DESYREL) 50 MG tablet TAKE 1/2 TO 1 TABLET BY MOUTH EVERY NIGHT AT BEDTIME AS NEEDED FOR SLEEP 90 tablet 3  . VENTOLIN HFA 108 (90 Base) MCG/ACT inhaler INHALE 2 PUFFS INTO THE LUNGS EVERY 6 HOURS AS NEEDED FOR WHEEZING OR SHORTNESS OF BREATH 18 g 0  . metoprolol succinate (TOPROL XL) 25 MG 24 hr tablet Take 1 tablet (25 mg total) by mouth 2 (two) times daily. 60 tablet 3   No current facility-administered medications for this encounter.     Allergies  Allergen Reactions  . Albuterol     Palpitations, intolerance  . Amiodarone     adverse reaction: Tremor/gait disurbance  . Atorvastatin     Body aches and pains--stiffness.   Stephanie [name] [Pitavastatin]     Body aches  . Propoxyphene N-Acetaminophen Nausea And Vomiting    Social History   Social History  . Marital status: Single    Spouse name: N/A  . Number of  children: 5  . Years of education: college   Occupational History  . Disabled.    Social History Main Topics  . Smoking status: Never Smoker  . Smokeless tobacco: Never Used  . Alcohol use No  . Drug use: No  . Sexual activity: Not Currently    Birth control/ protection: Surgical   Other Topics Concern  . Not on file   Social History Narrative   Pt lives in [place] with her son.  Unemployed.  Right handed.     Education college       Family History  Problem Relation Age of Onset  . Pneumonia Father  deceased  . Hypertension Father   . Cancer Father   . Multiple sclerosis Mother        hx  . Emphysema Other        runs in the family    ROS- All systems are reviewed and negative except as per the HPI above  Physical Exam: Vitals:   04/06/17 1037  BP: 140/76  Pulse: (!) 122  Weight: 257 lb 12.8 oz (116.9 kg)  Height: 5\' 3"  (1.6 m)   Wt Readings from Last 3 Encounters:  04/06/17 257 lb 12.8 oz (116.9 kg)  03/30/17 258 lb 6.4 oz (117.2 kg)  03/10/17 254 lb (115.2 kg)    Labs: Lab Results  Component Value Date   NA 142 03/24/2017   K 3.8 03/24/2017   CL 105 03/24/2017   CO2 22 03/19/2017   GLUCOSE 135 (H) 03/24/2017   BUN 27 (H) 03/24/2017   CREATININE 1.10 (H) 03/24/2017   CALCIUM 9.6 03/19/2017   PHOS 3.9 10/18/2016   MG 1.7 11/13/2016   Lab Results  Component Value Date   INR 1.2 (H) 04/05/2013   Lab Results  Component Value Date   CHOL 158 02/19/2017   HDL 46 (L) 02/19/2017   LDLCALC 93 02/19/2017   TRIG 95 02/19/2017     GEN- The patient is well appearing, alert and oriented x 3 today.   Head- normocephalic, atraumatic Eyes-  Sclera clear, conjunctiva pink Ears- hearing intact Oropharynx- clear Neck- supple, no JVP Lymph- no cervical lymphadenopathy Lungs- Clear to ausculation bilaterally, normal work of breathing Heart- irregular rate and rhythm, no murmurs, rubs or gallops, PMI not laterally displaced GI- soft, NT, ND, +  BS Extremities- no clubbing, cyanosis, or edema MS- no significant deformity or atrophy Skin- no rash or lesion Psych- euthymic mood, full affect Neuro- strength and sensation are intact  EKG- aflutter at 122 bpm, qrs int 98 ms, qtc 527 ms Epic records reviewed Echo-------------------------------------------------------------------- Study Conclusions  - Left ventricle: The cavity size was mildly dilated. There was   moderate concentric hypertrophy. Systolic function was severely   reduced. The estimated ejection fraction was 25%. Diffuse   hypokinesis. The study is not technically sufficient to allow   evaluation of LV diastolic function. - Mitral valve: Calcified annulus. Mildly thickened leaflets .   There was mild regurgitation. - Left atrium: The atrium was severely dilated. - Right ventricle: Systolic function was moderately reduced. - Right atrium: The atrium was mildly dilated. - Tricuspid valve: There was mild regurgitation.     Assessment and Plan:  1. Persistent Afib/flutter in the setting of hyperthyroidism  Continue carvedilol at 25 mg bid Restart metoprolol xl at 25 mg bid Continue Pradaxa 150 mg bid for chadsvasc score of at least 3  Unfortunately has successful cardioversion with ERAF Off amiodarone for potential side effects  2. Hyperthyroidism  Per endocrinology, but pt did not go back to Endo because she did not like the MD She is suppose to get with PCP to get another recommendation Pt was encouraged to get thyroid stabilized so this will not aggravate afib control  3. CHF Weight daily Diuretics as ordered Avoid salt   Will need input from Dr. [name] for further afib management, refer back to Dr. Rayann Heman for ablation or antiarrythmic   Geroge Baseman. Kripa Foskey, [facility] [address] [place], [place] [address] [phone]

## 2017-04-08 ENCOUNTER — Telehealth: Payer: Self-pay | Admitting: Family Medicine

## 2017-04-08 NOTE — Telephone Encounter (Signed)
Nicollete with [facility] [phone] called requesting verbal orders for skilled nurse to see one time per week for 4 weeks, along with 2 PRN visits.  Also wants verbal for PT to evaluate and OT to evaluate (2 week delay on OT)

## 2017-04-09 ENCOUNTER — Telehealth: Payer: Self-pay | Admitting: Medical

## 2017-04-09 NOTE — Telephone Encounter (Signed)
Nicolette with [facility] called again  See previous message requesting orders

## 2017-04-12 NOTE — Telephone Encounter (Signed)
That is fine, but make sure they send [place] info on the visits!

## 2017-04-12 NOTE — Telephone Encounter (Signed)
Spoke with [facility] to give the okay

## 2017-04-15 DIAGNOSIS — I5042 Chronic combined systolic (congestive) and diastolic (congestive) heart failure: Secondary | ICD-10-CM | POA: Diagnosis not present

## 2017-04-15 DIAGNOSIS — J45909 Unspecified asthma, uncomplicated: Secondary | ICD-10-CM | POA: Diagnosis not present

## 2017-04-15 DIAGNOSIS — E119 Type 2 diabetes mellitus without complications: Secondary | ICD-10-CM | POA: Diagnosis not present

## 2017-04-15 DIAGNOSIS — I480 Paroxysmal atrial fibrillation: Secondary | ICD-10-CM | POA: Diagnosis not present

## 2017-04-15 DIAGNOSIS — I110 Hypertensive heart disease with heart failure: Secondary | ICD-10-CM | POA: Diagnosis not present

## 2017-04-15 DIAGNOSIS — I4892 Unspecified atrial flutter: Secondary | ICD-10-CM | POA: Diagnosis not present

## 2017-04-23 DIAGNOSIS — I4892 Unspecified atrial flutter: Secondary | ICD-10-CM | POA: Diagnosis not present

## 2017-04-23 DIAGNOSIS — J45909 Unspecified asthma, uncomplicated: Secondary | ICD-10-CM | POA: Diagnosis not present

## 2017-04-23 DIAGNOSIS — I480 Paroxysmal atrial fibrillation: Secondary | ICD-10-CM | POA: Diagnosis not present

## 2017-04-23 DIAGNOSIS — I110 Hypertensive heart disease with heart failure: Secondary | ICD-10-CM | POA: Diagnosis not present

## 2017-04-23 DIAGNOSIS — I5042 Chronic combined systolic (congestive) and diastolic (congestive) heart failure: Secondary | ICD-10-CM | POA: Diagnosis not present

## 2017-04-23 DIAGNOSIS — E119 Type 2 diabetes mellitus without complications: Secondary | ICD-10-CM | POA: Diagnosis not present

## 2017-04-24 ENCOUNTER — Other Ambulatory Visit: Payer: Self-pay | Admitting: Family Medicine

## 2017-04-26 NOTE — Telephone Encounter (Signed)
Stephanie [name] are these okay to refill

## 2017-04-27 DIAGNOSIS — I110 Hypertensive heart disease with heart failure: Secondary | ICD-10-CM | POA: Diagnosis not present

## 2017-04-27 DIAGNOSIS — E119 Type 2 diabetes mellitus without complications: Secondary | ICD-10-CM | POA: Diagnosis not present

## 2017-04-27 DIAGNOSIS — I480 Paroxysmal atrial fibrillation: Secondary | ICD-10-CM | POA: Diagnosis not present

## 2017-04-27 DIAGNOSIS — I4892 Unspecified atrial flutter: Secondary | ICD-10-CM | POA: Diagnosis not present

## 2017-04-27 DIAGNOSIS — J45909 Unspecified asthma, uncomplicated: Secondary | ICD-10-CM | POA: Diagnosis not present

## 2017-04-27 DIAGNOSIS — I5042 Chronic combined systolic (congestive) and diastolic (congestive) heart failure: Secondary | ICD-10-CM | POA: Diagnosis not present

## 2017-05-05 ENCOUNTER — Ambulatory Visit (INDEPENDENT_AMBULATORY_CARE_PROVIDER_SITE_OTHER): Payer: Medicare Other | Admitting: Internal Medicine

## 2017-05-05 ENCOUNTER — Telehealth: Payer: Self-pay | Admitting: Medical

## 2017-05-05 ENCOUNTER — Encounter: Payer: Self-pay | Admitting: Internal Medicine

## 2017-05-05 VITALS — BP 138/94 | HR 118 | Ht 63.0 in | Wt 259.0 lb

## 2017-05-05 DIAGNOSIS — I428 Other cardiomyopathies: Secondary | ICD-10-CM

## 2017-05-05 DIAGNOSIS — I5022 Chronic systolic (congestive) heart failure: Secondary | ICD-10-CM

## 2017-05-05 DIAGNOSIS — E0590 Thyrotoxicosis, unspecified without thyrotoxic crisis or storm: Secondary | ICD-10-CM | POA: Diagnosis not present

## 2017-05-05 DIAGNOSIS — I4819 Other persistent atrial fibrillation: Secondary | ICD-10-CM

## 2017-05-05 DIAGNOSIS — I481 Persistent atrial fibrillation: Secondary | ICD-10-CM

## 2017-05-05 MED ORDER — LEVOTHYROXINE SODIUM 25 MCG PO TABS: 25.0000 ug | ORAL_TABLET | Freq: Every day | ORAL | 1 refills | Status: DC

## 2017-05-05 MED ORDER — SACUBITRIL-VALSARTAN 24-26 MG PO TABS: 1.0000 | ORAL_TABLET | Freq: Two times a day (BID) | ORAL | 0 refills | Status: DC

## 2017-05-05 MED ORDER — FUROSEMIDE 40 MG PO TABS: 60.0000 mg | ORAL_TABLET | Freq: Every day | ORAL | 6 refills | Status: DC

## 2017-05-05 MED ORDER — METOPROLOL SUCCINATE ER 50 MG PO TB24: 50.0000 mg | ORAL_TABLET | Freq: Two times a day (BID) | ORAL | 6 refills | Status: DC

## 2017-05-05 NOTE — Patient Instructions (Addendum)
Medication Instructions: Your physician has recommended you make the following change in your medication:  -1) START Entresto 24/26 mg - Take 1 tablet by mouth twice daily - Don't take your first dose until Friday 05/07/17 -2) STOP Losartan Potassium -3) INCREASE Furosemide (Lasix) 40 mg - Take 1.5 tablet (60 mg) by mouth daily -4)  START Levothyroxine (Synthroid) 25 mcg - Take 1 tablet (25 mcg) by mouth daily -5) INCREASE Metoprolol succinate 50 mg - Take 1 tablets (50 mg) by mouth twice daily   Labwork: None Ordered  Procedures/Testing: None Ordered  Follow-Up: - Call the office to let Heather (Dr. Aquilla Hacker Nurse) know when you would like to do you Tikosyn admission in the hospital    Any Additional Special Instructions Will Be Listed Below (If Applicable).     If you need a refill on your cardiac medications before your next appointment, please call your pharmacy.

## 2017-05-05 NOTE — Telephone Encounter (Signed)
Cariology called today and requested I work with her on controlling her hypothyroidism.  He is starting her on 25 mcg daily synthroid.  We will get her in for f/u in 28mo.  I spoke to [facility] up front who will schedule her

## 2017-05-05 NOTE — Progress Notes (Signed)
Patient Care Team: Tysinger, Camelia Eng, PA-C as PCP - General (Family Medicine)   HPI  Stephanie [name] is a 66 y.o. female Seen in followup for atrial fibrillation in the context of morbid obesity, sarcoid and mild left ventricular hypertrophy. Catheterization 2012 demonstrated nonobstructive coronary disease and normal left ventricular function and elevated filling pressures By echocardiogram, recurrent cardiomyopathy was demonstrated with EF 25-30% April 2014   She underwent cardioversion may/14 with the hopes of restoring sinus rhythm and seeing recovery of LV systolic function. She had rapidly reverted to afib and then spontaneously reverted to sinus rhythm. There has been  interval near normalization of LV systolic function confirmed by echo August 2014   She saw Dr. Greggory Brandy 3/15 to consider catheter ablation.  She was not interested. He reduced her amiodarone to 200 mg a day. Encouraged her to refrain from alcohol and to work on blood pressure and weight reduction.  She had a sleep study 2014 with an AHI of 7.5    She developed hyperthyroidism on amiodarone prompting its discontinuation. She has not been able to forge a working relationship with endo, bringing in internet reviews of Dr Dicie Beam modestly short of breath and fatigued No chest pain  Some edema No palpitations    DATE TEST    3/12    Echo   EF 25 %   8/14 Echo   EF 55*-60   %   12/17 Echo EF 25-30% LAE (47/2.0/45)       .  Date TSH Cr Hgb  6/17 1.9    1/18 <<0.01    3/18 2.2    6/18 7.4 1.1 12.6           .   Past Medical History:  Diagnosis Date  . Anemia   . Ankle fracture, right    x 2  . Antineoplastic and immunosuppressive drugs causing adverse effect in therapeutic use   . Asthma   . Atrial fibrillation ([place])   . Chronic venous hypertension without complications   . Colon polyps   . Colon polyps 2009  . Coronary artery disease    no CAD by cath 11.2010  . Depressive disorder, not  elsewhere classified   . Diastolic CHF, chronic ([place])   . Diverticulosis of colon 2009   colonoscopy  . Dyslipidemia   . Glucose intolerance (impaired glucose tolerance)    hgb AIC 6.1 09/02/2009  . Gout   . Hepatitis, unspecified    hx of  . Hx of hysterectomy 2002  . Hyperlipidemia   . Hypertension   . Irritable bowel syndrome   . Lung nodule    71mm RLL nodule on CT Chest  07/2009, resolved by 2011 CT  . Noncompliance   . Obesity   . Persistent atrial fibrillation ([place])    s/p DCCV 07/2009; Pradaxa Rx  . PONV (postoperative nausea and vomiting)   . Sarcoidosis    dx by eye exam  . Sleep disturbance 06/2013   sleep study, mild abnormality, no criteria for CPAP    Past Surgical History:  Procedure Laterality Date  . ABDOMINAL HYSTERECTOMY    . CARDIOVERSION     x 2  . CARDIOVERSION N/A 02/27/2013   Procedure: CARDIOVERSION;  Surgeon: Lelon Perla, MD;  Location: [facility] ENDOSCOPY;  Service: Cardiovascular;  Laterality: N/A;  . CARDIOVERSION N/A 03/24/2017   Procedure: CARDIOVERSION;  Surgeon: Josue Hector, MD;  Location: [place];  Service: Cardiovascular;  Laterality: N/A;  .  CHOLECYSTECTOMY    . COLONOSCOPY     Diverticulosis, colon polyps, repeat 2014, Dr. Deatra Ina  . ESOPHAGOGASTRODUODENOSCOPY N/A 05/16/2014   Procedure: ESOPHAGOGASTRODUODENOSCOPY (EGD);  Surgeon: Wonda Horner, MD;  Location: [facility] ORS;  Service: Gastroenterology;  Laterality: N/A;  . HERNIA REPAIR  2009    Current Outpatient Prescriptions  Medication Sig Dispense Refill  . allopurinol (ZYLOPRIM) 300 MG tablet TAKE 300 MG BY MOUTH DAILY AS NEEDED FOR GOUT 90 tablet 2  . ALPRAZolam (XANAX) 1 MG tablet Take 1 tablet (1 mg total) by mouth at bedtime as needed for anxiety. 45 tablet 2  . budesonide-formoterol (SYMBICORT) 160-4.5 MCG/ACT inhaler Inhale 2 puffs into the lungs 2 (two) times daily. 1 Inhaler 2  . carvedilol (COREG) 25 MG tablet Take 1 tablet (25 mg total) by mouth 2 (two) times daily. 60  tablet 11  . colchicine (COLCRYS) 0.6 MG tablet Take 1 tablet (0.6 mg total) by mouth daily as needed. Reported on 01/21/2016 (Patient taking differently: Take 0.6 mg by mouth daily as needed (GOUT). Reported on 01/21/2016) 45 tablet 1  . FLUoxetine (PROZAC) 20 MG tablet TAKE 1 TABLET(20 MG) BY MOUTH DAILY 90 tablet 0  . furosemide (LASIX) 40 MG tablet Take 1 tablet (40 mg total) by mouth daily. 90 tablet 3  . metoprolol succinate (TOPROL XL) 25 MG 24 hr tablet Take 1 tablet (25 mg total) by mouth 2 (two) times daily. 60 tablet 3  . potassium chloride SA (K-DUR,KLOR-CON) 20 MEQ tablet Take 1 tablet (20 mEq total) by mouth daily. 90 tablet 3  . PRADAXA 150 MG CAPS capsule TAKE ONE CAPSULE BY MOUTH TWICE DAILY 60 capsule 5  . rosuvastatin (CRESTOR) 20 MG tablet Take 1 tablet (20 mg total) by mouth daily. 90 tablet 3  . traZODone (DESYREL) 50 MG tablet TAKE 1/2 TO 1 TABLET BY MOUTH EVERY NIGHT AT BEDTIME AS NEEDED FOR SLEEP 90 tablet 3  . traZODone (DESYREL) 50 MG tablet TAKE 1/2 TO 1 TABLET BY MOUTH EVERY NIGHT AT BEDTIME AS NEEDED FOR SLEEP 90 tablet 0  . VENTOLIN HFA 108 (90 Base) MCG/ACT inhaler INHALE 2 PUFFS INTO THE LUNGS EVERY 6 HOURS AS NEEDED FOR WHEEZING OR SHORTNESS OF BREATH 18 g 0  . losartan (COZAAR) 25 MG tablet Take 1 tablet (25 mg total) by mouth daily. 90 tablet 3   No current facility-administered medications for this visit.     Allergies  Allergen Reactions  . Albuterol     Palpitations, intolerance  . Amiodarone     adverse reaction: Tremor/gait disurbance  . Atorvastatin     Body aches and pains--stiffness.   [name] [Pitavastatin]     Body aches  . Propoxyphene N-Acetaminophen Nausea And Vomiting    Review of Systems negative except from HPI and PMH  Physical Exam BP (!) 138/94   Pulse (!) 118   Ht 5\' 3"  (1.6 m)   Wt 259 lb (117.5 kg)   SpO2 97%   BMI 45.88 kg/m  \Well developed and nourished in no acute distress HENT normal Neck supple with  JVP-flat Carotids brisk and full without bruits Clear Regular rate and rhythm, no murmurs or gallops Abd-soft with active BS without hepatomegaly No Clubbing cyanosis tredema Skin-warm and dry A & Oriented  Grossly normal sensory and motor function   ECG: Atrial fibrillation at 102 Intervals-/11/45 Some PVC       Assessment and  Plan  Hypertension     Atrial fibrillation-long standing persistent  Cardiomyopathy non ischemic   Congestive heart failure  Class 3  Sarcoid  Obesity    Tremor   Hyperthyroidism >> now hypothyroidism   The pt is now hypothyroid    Spoke with PCP and will begin synthroid 25 and they will followup   She Is mildly overloaded, and so will gently increase her lasix 40>>60  With her ongoing cardiomyopathy, will change losartan to entresto--reviewed SE and benefits  Her Afib remains rapid despite being hypothryoid and so will augment her metoprolol ( also takes carvedilol) and we have talked about trying dofetilide   I have looked at baseline ECG in sinus (2016/7) with QTc 440  But now in afib it looks better.  The QTc might have been prolonged by amio  If we are unable to control her afib rhythm, she has had previous discussions re RFCA which she declined.  If still not an option would consider AV ablation with pacing  We also spent time trying to understand the physiology of TSH as reflection of thryoid status  More than 50% of 40 min was spent in counseling related to the above

## 2017-05-05 NOTE — Telephone Encounter (Signed)
Called pt and left message to call our offiice and schedule a 1 mo fu appt on her thyroid

## 2017-05-07 ENCOUNTER — Other Ambulatory Visit: Payer: Self-pay

## 2017-05-07 ENCOUNTER — Other Ambulatory Visit: Payer: Self-pay | Admitting: Internal Medicine

## 2017-05-07 DIAGNOSIS — R7989 Other specified abnormal findings of blood chemistry: Secondary | ICD-10-CM

## 2017-05-07 MED ORDER — FUROSEMIDE 40 MG PO TABS: 60.0000 mg | ORAL_TABLET | Freq: Every day | ORAL | 6 refills | Status: DC

## 2017-05-07 MED ORDER — METOPROLOL SUCCINATE ER 50 MG PO TB24: 50.0000 mg | ORAL_TABLET | Freq: Two times a day (BID) | ORAL | 6 refills | Status: DC

## 2017-05-07 NOTE — Telephone Encounter (Signed)
Pt called stating that her medication was sent to the wrong pharmacy. I resent the medication to the correct pharmacy as requested. Confirmation received.

## 2017-05-10 ENCOUNTER — Other Ambulatory Visit (INDEPENDENT_AMBULATORY_CARE_PROVIDER_SITE_OTHER): Payer: Medicare Other

## 2017-05-10 ENCOUNTER — Encounter: Payer: Self-pay | Admitting: Gastroenterology

## 2017-05-10 ENCOUNTER — Ambulatory Visit: Payer: Medicare Other | Admitting: Internal Medicine

## 2017-05-10 ENCOUNTER — Ambulatory Visit (INDEPENDENT_AMBULATORY_CARE_PROVIDER_SITE_OTHER): Payer: Medicare Other | Admitting: Gastroenterology

## 2017-05-10 VITALS — BP 98/60 | HR 72 | Temp 98.6°F | Ht 63.0 in | Wt 246.0 lb

## 2017-05-10 DIAGNOSIS — K529 Noninfective gastroenteritis and colitis, unspecified: Secondary | ICD-10-CM

## 2017-05-10 DIAGNOSIS — Z1211 Encounter for screening for malignant neoplasm of colon: Secondary | ICD-10-CM | POA: Diagnosis not present

## 2017-05-10 DIAGNOSIS — R112 Nausea with vomiting, unspecified: Secondary | ICD-10-CM | POA: Diagnosis not present

## 2017-05-10 DIAGNOSIS — R110 Nausea: Secondary | ICD-10-CM

## 2017-05-10 LAB — COMPREHENSIVE METABOLIC PANEL
ALK PHOS: 70 U/L (ref 39–117)
ALT: 22 U/L (ref 0–35)
AST: 21 U/L (ref 0–37)
Albumin: 4.1 g/dL (ref 3.5–5.2)
BILIRUBIN TOTAL: 1.3 mg/dL — AB (ref 0.2–1.2)
BUN: 23 mg/dL (ref 6–23)
CALCIUM: 10.3 mg/dL (ref 8.4–10.5)
CO2: 31 mEq/L (ref 19–32)
Chloride: 97 mEq/L (ref 96–112)
Creatinine, Ser: 1.51 mg/dL — ABNORMAL HIGH (ref 0.40–1.20)
GFR: 44.49 mL/min — AB (ref >60.00)
GLUCOSE: 177 mg/dL — AB (ref 70–99)
Potassium: 3.8 mEq/L (ref 3.5–5.1)
Sodium: 136 mEq/L (ref 135–145)
TOTAL PROTEIN: 8.1 g/dL (ref 6.0–8.3)

## 2017-05-10 LAB — CBC WITH DIFFERENTIAL/PLATELET
BASOS ABS: 0 10*3/uL (ref 0.0–0.1)
Basophils Relative: 0.3 % (ref 0.0–3.0)
Eosinophils Absolute: 0 10*3/uL (ref 0.0–0.7)
Eosinophils Relative: 0 % (ref 0.0–5.0)
HEMATOCRIT: 43.7 % (ref 36.0–46.0)
Hemoglobin: 14.2 g/dL (ref 12.0–15.0)
LYMPHS PCT: 33 % (ref 12.0–46.0)
Lymphs Abs: 3.9 10*3/uL (ref 0.7–4.0)
MCHC: 32.4 g/dL (ref 30.0–36.0)
MCV: 96.4 fl (ref 78.0–100.0)
MONOS PCT: 9.1 % (ref 3.0–12.0)
Monocytes Absolute: 1.1 10*3/uL — ABNORMAL HIGH (ref 0.1–1.0)
Neutro Abs: 6.8 10*3/uL (ref 1.4–7.7)
Neutrophils Relative %: 57.6 % (ref 43.0–77.0)
Platelets: 207 10*3/uL (ref 150.0–400.0)
RBC: 4.53 Mil/uL (ref 3.87–5.11)
RDW: 15.4 % (ref 11.5–15.5)
WBC: 11.8 10*3/uL — ABNORMAL HIGH (ref 4.0–10.5)

## 2017-05-10 MED ORDER — ONDANSETRON 4 MG PO TBDP: 4.0000 mg | ORAL_TABLET | Freq: Four times a day (QID) | ORAL | 3 refills | Status: DC | PRN

## 2017-05-10 NOTE — Patient Instructions (Signed)
If you are age 65 or older, your body mass index should be between 23-30. Your Body mass index is 43.58 kg/m. If this is out of the aforementioned range listed, please consider follow up with your Primary Care Provider.  If you are age 54 or younger, your body mass index should be between 19-25. Your Body mass index is 43.58 kg/m. If this is out of the aformentioned range listed, please consider follow up with your Primary Care Provider.   We have sent the following medications to your pharmacy for you to pick up at your convenience:  [facility] has requested that you go to the basement for the following lab work before leaving today:  CBC, CMet  Thank you.

## 2017-05-10 NOTE — Progress Notes (Signed)
HPI :  66 y/o female with a history of AF and CHF, on Pradaxa, here for a follow up visit.   Her initial reason for a visit was to discuss the next timing of her colonoscopy. We addressed this at her visit last year as well.  She had a colonoscopy in 2009 as outlined below, no adenomas. She reports some constipation / bloating which can bother her longstanding. She reports her bowels in general not been bothering her too much and have been regular until this weekend. She normally does not see blood in her stools. No FH of colon cancer.  She has a longstanding anemia, normocytic, dating back to 2009 to 2010, with normal iron stores. Hgb baseline around 10.  Patient states she has very poorly since last week - nausea, vomiting, decreased PO intake, diarrhea, abdominal pain, chills myalgias. This weekend she has felt poorly and not eating much. She still feels poorly, doesn't feel like she is getting better. She hasn't eaten much due to nausea. Her diarrhea stopped last Friday. She feels weak / tired, states she has decreased urine output. She is drinking fluids. She has not been able to take her medications due to vomiting / nausea.   Endoscopic history: EGD 05/16/14 - normal without pathology to explain symptoms of possible food impaction Colonoscopy 12/26/07 - 50mm benign descending colon polyp - benign no adenoma, diverticulosis  Echo 10/18/16 - EF 25%   Past Medical History:  Diagnosis Date  . Anemia   . Ankle fracture, right    x 2  . Antineoplastic and immunosuppressive drugs causing adverse effect in therapeutic use   . Asthma   . Atrial fibrillation ([place])   . Chronic venous hypertension without complications   . Colon polyps   . Colon polyps 2009  . Coronary artery disease    no CAD by cath 11.2010  . Depressive disorder, not elsewhere classified   . Diastolic CHF, chronic ([place])   . Diverticulosis of colon 2009   colonoscopy  . Dyslipidemia   . Glucose intolerance (impaired  glucose tolerance)    hgb AIC 6.1 09/02/2009  . Gout   . Hepatitis, unspecified    hx of  . Hx of hysterectomy 2002  . Hyperlipidemia   . Hypertension   . Irritable bowel syndrome   . Lung nodule    70mm RLL nodule on CT Chest  07/2009, resolved by 2011 CT  . Noncompliance   . Obesity   . Persistent atrial fibrillation ([place])    s/p DCCV 07/2009; Pradaxa Rx  . PONV (postoperative nausea and vomiting)   . Sarcoidosis    dx by eye exam  . Sleep disturbance 06/2013   sleep study, mild abnormality, no criteria for CPAP     Past Surgical History:  Procedure Laterality Date  . ABDOMINAL HYSTERECTOMY    . CARDIOVERSION     x 2  . CARDIOVERSION N/A 02/27/2013   Procedure: CARDIOVERSION;  Surgeon: Lelon Perla, MD;  Location[facility] ENDOSCOPY;  Service: Cardiovascular;  Laterality: N/A;  . CARDIOVERSION N/A 03/24/2017   Procedure: CARDIOVERSION;  Surgeon: Josue Hector, MD;  Location: [facility] ENDOSCOPY;  Service: Cardiovascular;  Laterality: N/A;  . CHOLECYSTECTOMY    . COLONOSCOPY     Diverticulosis, colon polyps, repeat 2014, Dr. Deatra Ina  . ESOPHAGOGASTRODUODENOSCOPY N/A 05/16/2014   Procedure: ESOPHAGOGASTRODUODENOSCOPY (EGD);  Surgeon: Wonda Horner, MD;  Location: [facility] ORS;  Service: Gastroenterology;  Laterality: N/A;  . HERNIA REPAIR  2009  Family History  Problem Relation Age of Onset  . Pneumonia Father        deceased  . Hypertension Father   . Cancer Father   . Multiple sclerosis Mother        hx  . Emphysema Other        runs in the family   Social History  Substance Use Topics  . Smoking status: Never Smoker  . Smokeless tobacco: Never Used  . Alcohol use No   Current Outpatient Prescriptions  Medication Sig Dispense Refill  . allopurinol (ZYLOPRIM) 300 MG tablet TAKE 300 MG BY MOUTH DAILY AS NEEDED FOR GOUT 90 tablet 2  . ALPRAZolam (XANAX) 1 MG tablet Take 1 tablet (1 mg total) by mouth at bedtime as needed for anxiety. 45 tablet 2  . budesonide-formoterol  (SYMBICORT) 160-4.5 MCG/ACT inhaler Inhale 2 puffs into the lungs 2 (two) times daily. 1 Inhaler 2  . carvedilol (COREG) 25 MG tablet Take 1 tablet (25 mg total) by mouth 2 (two) times daily. 60 tablet 11  . colchicine (COLCRYS) 0.6 MG tablet Take 1 tablet (0.6 mg total) by mouth daily as needed. Reported on 01/21/2016 (Patient taking differently: Take 0.6 mg by mouth daily as needed (GOUT). Reported on 01/21/2016) 45 tablet 1  . FLUoxetine (PROZAC) 20 MG tablet TAKE 1 TABLET(20 MG) BY MOUTH DAILY 90 tablet 0  . furosemide (LASIX) 40 MG tablet Take 1.5 tablets (60 mg total) by mouth daily. 45 tablet 6  . levothyroxine (SYNTHROID, LEVOTHROID) 25 MCG tablet Take 1 tablet (25 mcg total) by mouth daily before breakfast. 30 tablet 1  . metoprolol succinate (TOPROL-XL) 50 MG 24 hr tablet Take 1 tablet (50 mg total) by mouth 2 (two) times daily. Take with or immediately following a meal. 60 tablet 6  . potassium chloride SA (K-DUR,KLOR-CON) 20 MEQ tablet Take 1 tablet (20 mEq total) by mouth daily. 90 tablet 3  . PRADAXA 150 MG CAPS capsule TAKE ONE CAPSULE BY MOUTH TWICE DAILY 60 capsule 5  . rosuvastatin (CRESTOR) 20 MG tablet Take 1 tablet (20 mg total) by mouth daily. 90 tablet 3  . sacubitril-valsartan (ENTRESTO) 24-26 MG Take 1 tablet by mouth 2 (two) times daily. 60 tablet 0  . traZODone (DESYREL) 50 MG tablet TAKE 1/2 TO 1 TABLET BY MOUTH EVERY NIGHT AT BEDTIME AS NEEDED FOR SLEEP 90 tablet 3  . VENTOLIN HFA 108 (90 Base) MCG/ACT inhaler INHALE 2 PUFFS INTO THE LUNGS EVERY 6 HOURS AS NEEDED FOR WHEEZING OR SHORTNESS OF BREATH 18 g 0   No current facility-administered medications for this visit.    Allergies  Allergen Reactions  . Albuterol     Palpitations, intolerance  . Amiodarone     adverse reaction: Tremor/gait disurbance  . Atorvastatin     Body aches and pains--stiffness.   [name] [Pitavastatin]     Body aches  . Propoxyphene N-Acetaminophen Nausea And Vomiting     Review of  Systems: All systems reviewed and negative except where noted in HPI.   Lab Results  Component Value Date   WBC 11.8 (H) 05/10/2017   HGB 14.2 05/10/2017   HCT 43.7 05/10/2017   MCV 96.4 05/10/2017   PLT 207.0 05/10/2017    Lab Results  Component Value Date   CREATININE 1.51 (H) 05/10/2017   BUN 23 05/10/2017   NA 136 05/10/2017   K 3.8 05/10/2017   CL 97 05/10/2017   CO2 31 05/10/2017     Physical  Exam: BP 98/60   Pulse 72   Ht 5\' 3"  (1.6 m)   Wt 246 lb (111.6 kg)   BMI 43.58 kg/m  Constitutional: Pleasant,well-developed, female in no acute distress. HEENT: Normocephalic and atraumatic. Conjunctivae are normal. No scleral icterus. Neck supple.  Cardiovascular: Normal rate, regular rhythm.  Pulmonary/chest: Effort normal and breath sounds normal. No wheezing Abdominal: Soft, lower abdominal TTP -mild, no peritoneal signs, nontender. There are no masses palpable. No hepatomegaly. Extremities: no edema Lymphadenopathy: No cervical adenopathy noted. Neurological: Alert and oriented to person place and time. Skin: Skin is warm and dry. No rashes noted. Psychiatric: Normal mood and affect. Behavior is normal.   ASSESSMENT AND PLAN: 66 y/o female with history as above, here for assessment of the following issues:  Nausea / vomiting / diarrhea / myalgias / chills - I suspect she may likely have a viral gastroenteritis. Her diarrhea has not bothered her since the weekend, but nausea persists, joints aches, runny nose today. I obtained labs, Cr mildly increased likely due to dehydration, mild WBC elevation noted. I provided some zofran for her to take for nausea and encouraged her to push fluids. She should hold off on her lasix today. If she can't tolerate PO and feels she is getting worse, she will need to go to the ER or PCM for fluids and further evaluation. Held off on stool studies as she has had resolution of diarrhea. She agreed.   Colon cancer screening - due for  recall colonoscopy in 12/2017, given report of last exam in 2009 without adenomas given she has no alarm symptoms or other chronic changes. Her case will need to be done at the hospital if she wishes to have colonoscopy given her EF. We may consider stool testing as an alternative if she wishes to avoid colonoscopy. All questions answered.  Manteno Cellar, MD [facility]astro[facility] Pager [phone]

## 2017-05-12 ENCOUNTER — Telehealth: Payer: Self-pay | Admitting: Gastroenterology

## 2017-05-13 ENCOUNTER — Emergency Department (HOSPITAL_COMMUNITY): Payer: Medicare Other

## 2017-05-13 ENCOUNTER — Emergency Department (HOSPITAL_COMMUNITY)
Admission: EM | Admit: 2017-05-13 | Discharge: 2017-05-13 | Disposition: A | Discharge status: Home / Self Care | Payer: Medicare Other | Attending: Emergency Medicine | Admitting: Emergency Medicine

## 2017-05-13 ENCOUNTER — Encounter (HOSPITAL_COMMUNITY): Payer: Self-pay

## 2017-05-13 DIAGNOSIS — E119 Type 2 diabetes mellitus without complications: Secondary | ICD-10-CM | POA: Diagnosis not present

## 2017-05-13 DIAGNOSIS — R197 Diarrhea, unspecified: Secondary | ICD-10-CM | POA: Insufficient documentation

## 2017-05-13 DIAGNOSIS — R112 Nausea with vomiting, unspecified: Secondary | ICD-10-CM | POA: Diagnosis not present

## 2017-05-13 DIAGNOSIS — I10 Essential (primary) hypertension: Secondary | ICD-10-CM | POA: Insufficient documentation

## 2017-05-13 DIAGNOSIS — E860 Dehydration: Secondary | ICD-10-CM | POA: Diagnosis not present

## 2017-05-13 DIAGNOSIS — I4891 Unspecified atrial fibrillation: Secondary | ICD-10-CM | POA: Insufficient documentation

## 2017-05-13 DIAGNOSIS — I509 Heart failure, unspecified: Secondary | ICD-10-CM | POA: Diagnosis not present

## 2017-05-13 DIAGNOSIS — R531 Weakness: Secondary | ICD-10-CM

## 2017-05-13 DIAGNOSIS — Z79899 Other long term (current) drug therapy: Secondary | ICD-10-CM | POA: Insufficient documentation

## 2017-05-13 DIAGNOSIS — J45909 Unspecified asthma, uncomplicated: Secondary | ICD-10-CM | POA: Insufficient documentation

## 2017-05-13 DIAGNOSIS — R0602 Shortness of breath: Secondary | ICD-10-CM | POA: Diagnosis not present

## 2017-05-13 LAB — CBC WITH DIFFERENTIAL/PLATELET
BASOS ABS: 0 10*3/uL (ref 0.0–0.1)
Basophils Relative: 0 %
EOS PCT: 2 %
Eosinophils Absolute: 0.1 10*3/uL (ref 0.0–0.7)
HEMATOCRIT: 36.6 % (ref 36.0–46.0)
HEMOGLOBIN: 12.2 g/dL (ref 12.0–15.0)
LYMPHS ABS: 3.7 10*3/uL (ref 0.7–4.0)
LYMPHS PCT: 52 %
MCH: 31.3 pg (ref 26.0–34.0)
MCHC: 33.3 g/dL (ref 30.0–36.0)
MCV: 93.8 fL (ref 78.0–100.0)
Monocytes Absolute: 0.6 10*3/uL (ref 0.1–1.0)
Monocytes Relative: 8 %
NEUTROS ABS: 2.7 10*3/uL (ref 1.7–7.7)
Neutrophils Relative %: 38 %
Platelets: 219 10*3/uL (ref 150–400)
RBC: 3.9 MIL/uL (ref 3.87–5.11)
RDW: 14.4 % (ref 11.5–15.5)
WBC: 7.1 10*3/uL (ref 4.0–10.5)

## 2017-05-13 LAB — URINALYSIS, ROUTINE W REFLEX MICROSCOPIC
Bilirubin Urine: NEGATIVE
GLUCOSE, UA: NEGATIVE mg/dL
KETONES UR: NEGATIVE mg/dL
LEUKOCYTES UA: NEGATIVE
NITRITE: NEGATIVE
PH: 5 (ref 5.0–8.0)
Protein, ur: NEGATIVE mg/dL
Specific Gravity, Urine: 1.013 (ref 1.005–1.030)

## 2017-05-13 LAB — COMPREHENSIVE METABOLIC PANEL
ALT: 23 U/L (ref 14–54)
ANION GAP: 9 (ref 5–15)
AST: 25 U/L (ref 15–41)
Albumin: 3.8 g/dL (ref 3.5–5.0)
Alkaline Phosphatase: 69 U/L (ref 38–126)
BILIRUBIN TOTAL: 0.4 mg/dL (ref 0.3–1.2)
BUN: 23 mg/dL — ABNORMAL HIGH (ref 6–20)
CO2: 30 mmol/L (ref 22–32)
Calcium: 9.4 mg/dL (ref 8.9–10.3)
Chloride: 99 mmol/L — ABNORMAL LOW (ref 101–111)
Creatinine, Ser: 1.09 mg/dL — ABNORMAL HIGH (ref 0.44–1.00)
GFR, EST NON AFRICAN AMERICAN: 52 mL/min — AB (ref >60)
Glucose, Bld: 160 mg/dL — ABNORMAL HIGH (ref 65–99)
POTASSIUM: 3.6 mmol/L (ref 3.5–5.1)
Sodium: 138 mmol/L (ref 135–145)
TOTAL PROTEIN: 8.3 g/dL — AB (ref 6.5–8.1)

## 2017-05-13 LAB — POCT I-STAT TROPONIN I: Troponin i, poc: 0.01 ng/mL (ref 0.00–0.08)

## 2017-05-13 LAB — BRAIN NATRIURETIC PEPTIDE: B NATRIURETIC PEPTIDE 5: 238.6 pg/mL — AB (ref 0.0–100.0)

## 2017-05-13 MED ORDER — SODIUM CHLORIDE 0.9 % IV BOLUS (SEPSIS)
250.0000 mL | Freq: Once | INTRAVENOUS | Status: AC
  Administered 2017-05-13: 250 mL via INTRAVENOUS

## 2017-05-13 NOTE — ED Provider Notes (Signed)
66 yo F with nvd, resolved about 4 days ago.  Some worsening weakness.  ? Sent from GI for fluids.  Obtained in signout from Dr. Dina Rich.  Plan for oral trial, ua.  Patient tolerating PO, UA obtained but thrown away by accident.  Discussed with patient who is well appearing, non toxic, working on her 3rd cup of fluid.  Would still like to have her urine checked.   UA without ketones, negative for infection, d/c home.    Deno Etienne, DO 05/13/17 1121

## 2017-05-13 NOTE — Discharge Instructions (Signed)
Eat and drink well for the next couple of days.  Follow up with your PCP.  °

## 2017-05-13 NOTE — ED Triage Notes (Signed)
Pt complains of being short of breath, chest pains, and nauseated since last Friday Pt had a colonoscopy done Monday and hasn't felt good in over a week Pt see Dr [name] who wanted her admitted a few weeks ago

## 2017-05-13 NOTE — ED Provider Notes (Signed)
[facility] DEPT Provider Note   CSN: [id number] Arrival date & time: 05/13/17  0444     History   Chief Complaint Chief Complaint  Patient presents with  . Shortness of Breath    HPI Stephanie [name] is a 66 y.o. female.  HPI  This is a 66 year old female with a history of atrial fibrillation on Pradaxa, hyperlipidemia, asthma, congestive heart failure who presents with nausea, vomiting, and diarrhea. She states generally "I don't feel well." Patient reports that she began having vomiting and diarrhea on Friday of last week. Symptoms somewhat improved by Sunday. She saw her gastroenterologist on Monday who ordered basic lab work. At that time she was noted to have mildly elevated creatinine. He discharged her on Zofran. She states that since that time she has not had any more vomiting but has continued to have some loose stools. No significant abdominal pain. No chest pain. Patient reports chronic shortness of breath. No fevers. Patient reports that she received a call from her gastroenterology office and the nurse told her that she may need to have some fluids.  Past Medical History:  Diagnosis Date  . Anemia   . Ankle fracture, right    x 2  . Antineoplastic and immunosuppressive drugs causing adverse effect in therapeutic use   . Asthma   . Atrial fibrillation ([place])   . Chronic venous hypertension without complications   . Colon polyps   . Colon polyps 2009  . Coronary artery disease    no CAD by cath 11.2010  . Depressive disorder, not elsewhere classified   . Diastolic CHF, chronic ([place])   . Diverticulosis of colon 2009   colonoscopy  . Dyslipidemia   . Glucose intolerance (impaired glucose tolerance)    hgb AIC 6.1 09/02/2009  . Gout   . Hepatitis, unspecified    hx of  . Hx of hysterectomy 2002  . Hyperlipidemia   . Hypertension   . Irritable bowel syndrome   . Lung nodule    52mm RLL nodule on CT Chest  07/2009, resolved by 2011 CT  . Noncompliance   .  Obesity   . Persistent atrial fibrillation ([place])    s/p DCCV 07/2009; Pradaxa Rx  . PONV (postoperative nausea and vomiting)   . Sarcoidosis    dx by eye exam  . Sleep disturbance 06/2013   sleep study, mild abnormality, no criteria for CPAP    Patient Active Problem List   Diagnosis Date Noted  . Typical atrial flutter ([place])   . Thyrotoxicosis 11/24/2016  . Atrial fibrillation with rapid ventricular response ([place]) 11/12/2016  . Demand ischemia ([place]) 11/12/2016  . Chronic diarrhea 11/12/2016  . Atrial fibrillation with RVR ([place]) 11/12/2016  . Acute systolic CHF (congestive heart failure) ([place])   . Acute on chronic systolic CHF (congestive heart failure) ([place]) 10/18/2016  . Hypokalemia 10/18/2016  . Need for prophylactic vaccination against Streptococcus pneumoniae (pneumococcus) 08/20/2016  . Type 2 diabetes mellitus with complication, without long-term current use of insulin ([place]) 05/12/2016  . Edema 05/12/2016  . B12 deficiency 05/06/2016  . Normocytic anemia 04/15/2016  . Insomnia 01/21/2016  . Pelvic pain in female 08/06/2015  . Screening for breast cancer 08/06/2015  . History of anemia 08/02/2015  . Other specified nutritional anemias 08/02/2015  . Urinary pain 08/02/2015  . Need for prophylactic vaccination and inoculation against influenza 08/02/2015  . Obesity 08/02/2015  . Dyslipidemia 08/02/2015  . History of sarcoidosis 08/02/2015  . Generalized anxiety disorder 08/02/2015  .  Gout 08/02/2015  . Abnormal thyroid blood test 08/02/2015  . Physical exam 08/02/2015  . Needs flu shot 08/02/2015  . Dysthymic disorder 08/02/2015  . Ataxia 08/22/2014  . Cardiomyopathy, secondary ([place]) 01/28/2011  . Acute on chronic systolic heart failure ([place]) 01/28/2011  . CHRONIC DIASTOLIC HEART FAILURE 70/62/3762  . Essential hypertension 09/02/2009  . Atrial fibrillation ([place]) 08/20/2009  . Lung nodule 08/20/2009  . CHRONIC VENOUS HYPERTENSION WITHOUT COMPS 11/21/2007    Past  Surgical History:  Procedure Laterality Date  . ABDOMINAL HYSTERECTOMY    . CARDIOVERSION     x 2  . CARDIOVERSION N/A 02/27/2013   Procedure: CARDIOVERSION;  Surgeon: Lelon Perla, MD;  Location: [facility] ENDOSCOPY;  Service: Cardiovascular;  Laterality: N/A;  . CARDIOVERSION N/A 03/24/2017   Procedure: CARDIOVERSION;  Surgeon: Josue Hector, MD;  Location: [facility] ENDOSCOPY;  Service: Cardiovascular;  Laterality: N/A;  . CHOLECYSTECTOMY    . COLONOSCOPY     Diverticulosis, colon polyps, repeat 2014, Dr. Deatra Ina  . ESOPHAGOGASTRODUODENOSCOPY N/A 05/16/2014   Procedure: ESOPHAGOGASTRODUODENOSCOPY (EGD);  Surgeon: Wonda Horner, MD;  Location: [facility] ORS;  Service: Gastroenterology;  Laterality: N/A;  . HERNIA REPAIR  2009    OB History    No data available       Home Medications    Prior to Admission medications   Medication Sig Start Date End Date Taking? Authorizing Provider  allopurinol (ZYLOPRIM) 300 MG tablet TAKE 300 MG BY MOUTH DAILY AS NEEDED FOR GOUT 02/22/17   Tysinger, Camelia Eng, PA-C  ALPRAZolam Duanne Moron) 1 MG tablet Take 1 tablet (1 mg total) by mouth at bedtime as needed for anxiety. 02/19/17   Tysinger, Camelia Eng, PA-C  budesonide-formoterol (SYMBICORT) 160-4.5 MCG/ACT inhaler Inhale 2 puffs into the lungs 2 (two) times daily. 03/31/17   Tysinger, Camelia Eng, PA-C  carvedilol (COREG) 25 MG tablet Take 1 tablet (25 mg total) by mouth 2 (two) times daily. 11/24/16   Imogene Burn, PA-C  colchicine (COLCRYS) 0.6 MG tablet Take 1 tablet (0.6 mg total) by mouth daily as needed. Reported on 01/21/2016 Patient taking differently: Take 0.6 mg by mouth daily as needed (GOUT). Reported on 01/21/2016 01/21/16   Carlena Hurl, PA-C  FLUoxetine (PROZAC) 20 MG tablet TAKE 1 TABLET(20 MG) BY MOUTH DAILY 04/26/17   Tysinger, Camelia Eng, PA-C  furosemide (LASIX) 40 MG tablet Take 1.5 tablets (60 mg total) by mouth daily. 05/07/17   Deboraha Sprang, MD  levothyroxine (SYNTHROID, LEVOTHROID) 25 MCG tablet Take 1 tablet  (25 mcg total) by mouth daily before breakfast. 05/05/17   Deboraha Sprang, MD  metoprolol succinate (TOPROL-XL) 50 MG 24 hr tablet Take 1 tablet (50 mg total) by mouth 2 (two) times daily. Take with or immediately following a meal. 05/07/17 08/05/17  Deboraha Sprang, MD  ondansetron (ZOFRAN ODT) 4 MG disintegrating tablet Take 1-2 tablets (4-8 mg total) by mouth every 6 (six) hours as needed for nausea or vomiting. 05/10/17   Armbruster, Renelda Loma, MD  potassium chloride SA (K-DUR,KLOR-CON) 20 MEQ tablet Take 1 tablet (20 mEq total) by mouth daily. 03/31/17   Tysinger, Camelia Eng, PA-C  PRADAXA 150 MG CAPS capsule TAKE ONE CAPSULE BY MOUTH TWICE DAILY 12/14/16   Deboraha Sprang, MD  rosuvastatin (CRESTOR) 20 MG tablet Take 1 tablet (20 mg total) by mouth daily. 03/31/17   Tysinger, Camelia Eng, PA-C  sacubitril-valsartan (ENTRESTO) 24-26 MG Take 1 tablet by mouth 2 (two) times daily. 05/05/17   [name],  Revonda Standard, MD  traZODone (DESYREL) 50 MG tablet TAKE 1/2 TO 1 TABLET BY MOUTH EVERY NIGHT AT BEDTIME AS NEEDED FOR SLEEP 02/22/17   Tysinger, Camelia Eng, PA-C  VENTOLIN HFA 108 (90 Base) MCG/ACT inhaler INHALE 2 PUFFS INTO THE LUNGS EVERY 6 HOURS AS NEEDED FOR WHEEZING OR SHORTNESS OF BREATH 02/02/17   Tysinger, Camelia Eng, PA-C    Family History Family History  Problem Relation Age of Onset  . Pneumonia Father        deceased  . Hypertension Father   . Cancer Father   . Multiple sclerosis Mother        hx  . Emphysema Other        runs in the family    Social History Social History  Substance Use Topics  . Smoking status: Never Smoker  . Smokeless tobacco: Never Used  . Alcohol use No     Allergies   Albuterol; Amiodarone; Atorvastatin; Livalo [pitavastatin]; and Propoxyphene n-acetaminophen   Review of Systems Review of Systems  Constitutional: Positive for fatigue. Negative for fever.  Respiratory: Positive for shortness of breath.   Cardiovascular: Negative for chest pain.  Gastrointestinal:  Positive for diarrhea, nausea and vomiting. Negative for abdominal pain.  Genitourinary: Negative for dysuria.  All other systems reviewed and are negative.    Physical Exam Updated Vital Signs BP (!) 190/146 (BP Location: Right Arm)   Pulse (!) 115   Temp 97.9 F (36.6 C) (Oral)   Resp 15   SpO2 97%   Physical Exam  Constitutional: She is oriented to person, place, and time.  Obese, no acute distress  HENT:  Head: Normocephalic and atraumatic.  Cardiovascular: Regular rhythm and normal heart sounds.   Irregular rhythm, tachycardia  Pulmonary/Chest: Effort normal. No respiratory distress. She has no wheezes.  Distant breath sounds, limited by body habitus  Abdominal: Soft. Bowel sounds are normal. There is no tenderness. There is no guarding.  Musculoskeletal: She exhibits edema.  Neurological: She is alert and oriented to person, place, and time.  Skin: Skin is warm and dry.  Psychiatric: She has a normal mood and affect.  Nursing note and vitals reviewed.    ED Treatments / Results  Labs (all labs ordered are listed, but only abnormal results are displayed) Labs Reviewed  CBC WITH DIFFERENTIAL/PLATELET  BRAIN NATRIURETIC PEPTIDE  COMPREHENSIVE METABOLIC PANEL  URINALYSIS, ROUTINE W REFLEX MICROSCOPIC  I-STAT TROPONIN, ED    EKG  EKG Interpretation  Date/Time:  Thursday May 13 2017 05:03:04 EDT Ventricular Rate:  110 PR Interval:    QRS Duration: 107 QT Interval:  380 QTC Calculation: 491 R Axis:   14 Text Interpretation:  Atrial fibrillation/flutter LVH with secondary repolarization abnormality Borderline prolonged QT interval Similar to prior Confirmed by Thayer Jew [phone]) on 05/13/2017 5:10:29 AM       Radiology No results found.  Procedures Procedures (including critical care time)  Medications Ordered in ED Medications  sodium chloride 0.9 % bolus 250 mL (not administered)     Initial Impression / Assessment and Plan / ED Course  I  have reviewed the triage vital signs and the nursing notes.  Pertinent labs & imaging results that were available during my care of the patient were reviewed by me and considered in my medical decision making (see chart for details).     Patient presents with generalized weakness and feeling poorly. Recent history of nausea, vomiting, and diarrhea.  Also history of atrial flutter and reduced EF.  Currently she is in atrial flutter with a rate in the 100s. She is nontoxic-appearing. Vital signs are otherwise reassuring. She does appear dry. Patient given 250 mL of fluid given her EF of 25%. Lab work obtained. Lab work is largely reassuring. Creatinine appears to have recovered. Abdominal exam is benign. Do not feel she needs imaging. Awaiting urinalysis. At time of signout urinalysis pending. Disposition likely home with close follow-up.  After history, exam, and medical workup I feel the patient has been appropriately medically screened and is safe for discharge home. Pertinent diagnoses were discussed with the patient. Patient was given return precautions.    Final Clinical Impressions(s) / ED Diagnoses   Final diagnoses:  Weakness  Dehydration    New Prescriptions New Prescriptions   No medications on file     Merryl Hacker, MD 05/13/17 [id number]

## 2017-05-13 NOTE — ED Notes (Signed)
Water and apple juice given.

## 2017-05-19 ENCOUNTER — Ambulatory Visit (INDEPENDENT_AMBULATORY_CARE_PROVIDER_SITE_OTHER): Payer: Medicare Other | Admitting: Medical

## 2017-05-19 ENCOUNTER — Encounter: Payer: Self-pay | Admitting: Medical

## 2017-05-19 VITALS — BP 132/80 | HR 62 | Wt 261.0 lb

## 2017-05-19 DIAGNOSIS — I5032 Chronic diastolic (congestive) heart failure: Secondary | ICD-10-CM | POA: Diagnosis not present

## 2017-05-19 DIAGNOSIS — I481 Persistent atrial fibrillation: Secondary | ICD-10-CM | POA: Diagnosis not present

## 2017-05-19 DIAGNOSIS — E079 Disorder of thyroid, unspecified: Secondary | ICD-10-CM | POA: Insufficient documentation

## 2017-05-19 DIAGNOSIS — R112 Nausea with vomiting, unspecified: Secondary | ICD-10-CM | POA: Diagnosis not present

## 2017-05-19 DIAGNOSIS — I429 Cardiomyopathy, unspecified: Secondary | ICD-10-CM

## 2017-05-19 DIAGNOSIS — I4819 Other persistent atrial fibrillation: Secondary | ICD-10-CM

## 2017-05-19 NOTE — Progress Notes (Signed)
Subjective: Chief Complaint  Patient presents with  . er follow up    er follow up from weakness    Here for ED f/u.  Was seen in the emergency dept 05/13/17  She went to ED for shortness of breath.  She has hx/o Afib on Pradaxa, hyperlipidemia, asthma, CHF, who presented with SOB, nausea, vomiting.  She notes having violent episode of vomiting, still has a little twinge of pain in right lower abdomen.   She lost about 10 lb in a day from vomiting and diarrhea.  Had seen GI and was sent to the ED that day.  Currently she reports no nausea or vomiting.   No current diarrhea, more soft than anything.  Been eating some popsickles to cool her stomach.   She notes she is doing better with water intake currently.   Other than one tablet 2 days ago, she hasn't taken lasix since ED visit given dehydration and fluid loss.    Is compliant with pradaxa.   She reports taking the Synthroid 31mcg.    She does endorse soda here or there since she avoids sweets otherwise .  She knows this will cause her to gain some fluid.     Past Medical History:  Diagnosis Date  . Anemia   . Ankle fracture, right    x 2  . Antineoplastic and immunosuppressive drugs causing adverse effect in therapeutic use   . Asthma   . Atrial fibrillation ([place])   . Chronic venous hypertension without complications   . Colon polyps   . Colon polyps 2009  . Coronary artery disease    no CAD by cath 11.2010  . Depressive disorder, not elsewhere classified   . Diastolic CHF, chronic ([place])   . Diverticulosis of colon 2009   colonoscopy  . Dyslipidemia   . Glucose intolerance (impaired glucose tolerance)    hgb AIC 6.1 09/02/2009  . Gout   . Hepatitis, unspecified    hx of  . Hx of hysterectomy 2002  . Hyperlipidemia   . Hypertension   . Irritable bowel syndrome   . Lung nodule    35mm RLL nodule on CT Chest  07/2009, resolved by 2011 CT  . Noncompliance   . Obesity   . Persistent atrial fibrillation ([place])    s/p  DCCV 07/2009; Pradaxa Rx  . PONV (postoperative nausea and vomiting)   . Sarcoidosis    dx by eye exam  . Sleep disturbance 06/2013   sleep study, mild abnormality, no criteria for CPAP   Current Outpatient Prescriptions on File Prior to Visit  Medication Sig Dispense Refill  . allopurinol (ZYLOPRIM) 300 MG tablet TAKE 300 MG BY MOUTH DAILY AS NEEDED FOR GOUT 90 tablet 2  . ALPRAZolam (XANAX) 1 MG tablet Take 1 tablet (1 mg total) by mouth at bedtime as needed for anxiety. 45 tablet 2  . budesonide-formoterol (SYMBICORT) 160-4.5 MCG/ACT inhaler Inhale 2 puffs into the lungs 2 (two) times daily. 1 Inhaler 2  . carvedilol (COREG) 25 MG tablet Take 1 tablet (25 mg total) by mouth 2 (two) times daily. 60 tablet 11  . colchicine (COLCRYS) 0.6 MG tablet Take 1 tablet (0.6 mg total) by mouth daily as needed. Reported on 01/21/2016 (Patient taking differently: Take 0.6 mg by mouth daily as needed (GOUT). Reported on 01/21/2016) 45 tablet 1  . furosemide (LASIX) 40 MG tablet Take 1.5 tablets (60 mg total) by mouth daily. 45 tablet 6  . metoprolol succinate (TOPROL-XL) 50 MG  24 hr tablet Take 1 tablet (50 mg total) by mouth 2 (two) times daily. Take with or immediately following a meal. 60 tablet 6  . potassium chloride SA (K-DUR,KLOR-CON) 20 MEQ tablet Take 1 tablet (20 mEq total) by mouth daily. 90 tablet 3  . PRADAXA 150 MG CAPS capsule TAKE ONE CAPSULE BY MOUTH TWICE DAILY 60 capsule 5  . rosuvastatin (CRESTOR) 20 MG tablet Take 1 tablet (20 mg total) by mouth daily. 90 tablet 3  . sacubitril-valsartan (ENTRESTO) 24-26 MG Take 1 tablet by mouth 2 (two) times daily. 60 tablet 0  . traZODone (DESYREL) 50 MG tablet TAKE 1/2 TO 1 TABLET BY MOUTH EVERY NIGHT AT BEDTIME AS NEEDED FOR SLEEP 90 tablet 3  . VENTOLIN HFA 108 (90 Base) MCG/ACT inhaler INHALE 2 PUFFS INTO THE LUNGS EVERY 6 HOURS AS NEEDED FOR WHEEZING OR SHORTNESS OF BREATH 18 g 0  . FLUoxetine (PROZAC) 20 MG tablet TAKE 1 TABLET(20 MG) BY MOUTH  DAILY 90 tablet 0  . levothyroxine (SYNTHROID, LEVOTHROID) 25 MCG tablet Take 1 tablet (25 mcg total) by mouth daily before breakfast. (Patient not taking: Reported on 05/19/2017) 30 tablet 1  . [DISCONTINUED] propranolol (INDERAL) 60 MG tablet Take 60 mg by mouth daily.       No current facility-administered medications on file prior to visit.    ROS as in subjective    Objective: BP 132/80   Pulse 62   Wt 261 lb (118.4 kg)   SpO2 98%   BMI 46.23 kg/m   Wt Readings from Last 3 Encounters:  05/19/17 261 lb (118.4 kg)  05/10/17 246 lb (111.6 kg)  05/05/17 259 lb (117.5 kg)   General appearance: alert, no distress, WD/WN,  HEENT: normocephalic, sclerae anicteric, TMs pearly, nares patent, no discharge or erythema, pharynx normal Oral cavity: MMM, no lesions Neck: supple, no lymphadenopathy, no thyromegaly, no masses Heart: irregular rate, otherwise RR, normal S1, S2, no murmurs Lungs: CTA bilaterally, no wheezes, rhonchi, or rales Abdomen: +bs, soft, mild RLQ tenderness, otherwise non tender, non distended, no masses, no hepatomegaly, no splenomegaly Pulses: 1+ symmetric, upper and lower extremities, normal cap refill Ext: no obvious edema     Assessment: Encounter Diagnoses  Name Primary?  . CHRONIC DIASTOLIC HEART FAILURE Yes  . Cardiomyopathy, secondary ([place])   . Thyroid disease   . Nausea and vomiting, intractability of vomiting not specified, unspecified vomiting type   . Persistent atrial fibrillation ([place])      Plan: Reviewed ED visit notes.  She was given 277ml fluid in ED.  Had labs  Reviewed her cardiology notes from 05/05/17.  At that time she was mildly overloaded, and lasix was increased to 60mg  from 40mg  daily. Her weight at that visit was 259lb.  Recommendations:  Add back Lasix 40mg  daily  Check weights daily  Your baseline should be around 255 lb  Your weight today was 261lb   If you gain or lose 5+ lb in a single day or over a few days, then  call us  Continue Thyroid medication, Synthroid/Levothyroxine 54mcg daily in the morning on an empty stomach about 45 minutes prior to breast  You are also on Coreg 25mg  twice daily for blood pressure.  Don't get this confused with Synthroid 35mcg.    Follow up as planned on 06/03/17  Stephanie [name] was seen today for er follow up.  Diagnoses and all orders for this visit:  CHRONIC DIASTOLIC HEART FAILURE  Cardiomyopathy, secondary ([place])  Thyroid disease  Nausea and vomiting, intractability of vomiting not specified, unspecified vomiting type  Persistent atrial fibrillation ([facility])

## 2017-05-19 NOTE — Patient Instructions (Addendum)
Recommendations:  Add back Lasix 40mg  daily  Check weights daily  Your baseline should be around 255 lb  Your weight today was 261lb   If you gain or lose 5+ lb in a single day or over a few days, then call us  Continue Thyroid medication, Synthroid/Levothyroxine 7mcg daily in the morning on an empty stomach about 45 minutes prior to breast  You are also on Coreg 25mg  twice daily for blood pressure.  Don't get this confused with Synthroid 64mcg.    Follow up as planned on 06/03/17

## 2017-05-28 DIAGNOSIS — I110 Hypertensive heart disease with heart failure: Secondary | ICD-10-CM | POA: Diagnosis not present

## 2017-05-28 DIAGNOSIS — I5042 Chronic combined systolic (congestive) and diastolic (congestive) heart failure: Secondary | ICD-10-CM | POA: Diagnosis not present

## 2017-05-28 DIAGNOSIS — I4892 Unspecified atrial flutter: Secondary | ICD-10-CM | POA: Diagnosis not present

## 2017-05-28 DIAGNOSIS — J45909 Unspecified asthma, uncomplicated: Secondary | ICD-10-CM | POA: Diagnosis not present

## 2017-05-28 DIAGNOSIS — E119 Type 2 diabetes mellitus without complications: Secondary | ICD-10-CM | POA: Diagnosis not present

## 2017-05-28 DIAGNOSIS — I480 Paroxysmal atrial fibrillation: Secondary | ICD-10-CM | POA: Diagnosis not present

## 2017-06-01 ENCOUNTER — Telehealth: Payer: Self-pay | Admitting: Internal Medicine

## 2017-06-01 NOTE — Telephone Encounter (Signed)
Medication list reviewed in anticipation of upcoming Tikosyn initiation. Patient is currently taking Fluoxetine, alprazolam prn for anxiety and Trazodone prn for sleep. Would recommend she discuss change to SNRI with PCP due to potential for QTc prolongation with Fluoxetine. Would also recommend she wean alprazolam (due to CYP interaction) and trazodone (QTc prolongating) if possible. She is not currently on any other QTc prolonging medications. Patient is anticoagulated on Pradaxa on the appropriate dose.  Please ensure that patient has not missed any anticoagulation doses in the 3 weeks prior to Tikosyn initiation. Patient will need to be counseled to avoid use of Benadryl while on Tikosyn and in the 2-3 days prior to Tikosyn initiation.

## 2017-06-01 NOTE — Telephone Encounter (Signed)
I spoke with the patient. She has decided that she is ready to go in for Tikosyn admission. She would like to go in this week if possible. I advised the patient I would forward to the a-fib clinic to review and be in touch with her about scheduling admission. She is agreeable.

## 2017-06-01 NOTE — Telephone Encounter (Signed)
New message    Pt is calling about scheduling an appt for a procedure with Dr. [name]. Please call.

## 2017-06-02 ENCOUNTER — Telehealth: Payer: Self-pay

## 2017-06-02 NOTE — Telephone Encounter (Signed)
Left message for pt to return call.

## 2017-06-02 NOTE — Telephone Encounter (Signed)
Patient want to talk to you about appointment please call as soon as you can please

## 2017-06-02 NOTE — Telephone Encounter (Signed)
Cancel pt for 08/15 /2018 because she come in on last week for this.

## 2017-06-03 ENCOUNTER — Ambulatory Visit: Payer: Medicare Other | Admitting: Medical

## 2017-06-03 NOTE — Telephone Encounter (Signed)
Patient called back stating she has been having issues with her stomach -- diarrhea and vomiting for the past 2 weeks or more so she missed all of her medication for the last 2 days (including pradaxa). Went over instructions with Tikosyn admission - she would not be eligible for admission until 3 weeks of uninterrupted Pradaxa -- also reinitiated the importance of medication compliance as tikosyn cannot be missed. Instructed patient to contact her PCP in regards to abdominal pain/nausea/vomiting as this will need to be resolved prior to admission for tikosyn. Patient states she does not take fluoxetine but does occasionally take xanax and trazodone -- instructed pt she will need to talk with her PCP in regards to replacement that does not effect her QT. Pt will work with PCP to have abdominal pain worked up as well as medication changes for xanax and trazodone. Pt will call back in the next week or so once above has been resolved.

## 2017-06-04 ENCOUNTER — Encounter: Payer: Self-pay | Admitting: Medical

## 2017-06-04 ENCOUNTER — Ambulatory Visit (INDEPENDENT_AMBULATORY_CARE_PROVIDER_SITE_OTHER): Payer: Medicare Other | Admitting: Medical

## 2017-06-04 ENCOUNTER — Telehealth: Payer: Self-pay | Admitting: Medical

## 2017-06-04 VITALS — BP 134/72 | HR 89 | Wt 256.0 lb

## 2017-06-04 DIAGNOSIS — G4700 Insomnia, unspecified: Secondary | ICD-10-CM

## 2017-06-04 DIAGNOSIS — R109 Unspecified abdominal pain: Secondary | ICD-10-CM | POA: Diagnosis not present

## 2017-06-04 DIAGNOSIS — E079 Disorder of thyroid, unspecified: Secondary | ICD-10-CM

## 2017-06-04 DIAGNOSIS — R102 Pelvic and perineal pain unspecified side: Secondary | ICD-10-CM

## 2017-06-04 DIAGNOSIS — M1A9XX0 Chronic gout, unspecified, without tophus (tophi): Secondary | ICD-10-CM

## 2017-06-04 DIAGNOSIS — R110 Nausea: Secondary | ICD-10-CM | POA: Insufficient documentation

## 2017-06-04 DIAGNOSIS — Z9119 Patient's noncompliance with other medical treatment and regimen: Secondary | ICD-10-CM

## 2017-06-04 DIAGNOSIS — E118 Type 2 diabetes mellitus with unspecified complications: Secondary | ICD-10-CM | POA: Diagnosis not present

## 2017-06-04 DIAGNOSIS — I5032 Chronic diastolic (congestive) heart failure: Secondary | ICD-10-CM | POA: Diagnosis not present

## 2017-06-04 DIAGNOSIS — F341 Dysthymic disorder: Secondary | ICD-10-CM

## 2017-06-04 DIAGNOSIS — Z91199 Patient's noncompliance with other medical treatment and regimen due to unspecified reason: Secondary | ICD-10-CM

## 2017-06-04 DIAGNOSIS — Z72821 Inadequate sleep hygiene: Secondary | ICD-10-CM

## 2017-06-04 LAB — T4, FREE: Free T4: 1.1 ng/dL (ref 0.8–1.8)

## 2017-06-04 LAB — TSH: TSH: 8.34 m[IU]/L — AB

## 2017-06-04 NOTE — Patient Instructions (Addendum)
Recommendations: Abdomen and pelvic pain - we are checking labs today.   We are referring you back to gastroentology to help determine why you are still getting pain  Gout - continue Allopurinol daily for PREVENTION.   Try to only use Colcrys sparingly for flare ups.   Since you are using this 3-4 times per week, I am checking your uric acid levels to see if we need to modify your prevention medication.   We will call back about this.  Thyroid disease - we will call back with lab levels.  We will likely need to increase the Levothyroxine/Synthroid dose.  Currently you are taking 28mcg daily.   Sleep - for now continue Xanax for as needed use, but we may change this.  STOP THESE MEDICATIONS Trazodone Fluoxetine    Insomnia Insomnia is a sleep disorder that makes it difficult to fall asleep or to stay asleep. Insomnia can cause tiredness (fatigue), low energy, difficulty concentrating, mood swings, and poor performance at work or school. There are three different ways to classify insomnia:  Difficulty falling asleep.  Difficulty staying asleep.  Waking up too early in the morning.  Any type of insomnia can be long-term (chronic) or short-term (acute). Both are common. Short-term insomnia usually lasts for three months or less. Chronic insomnia occurs at least three times a week for longer than three months. What are the causes? Insomnia may be caused by another condition, situation, or substance, such as:  Anxiety.  Certain medicines.  Gastroesophageal reflux disease (GERD) or other gastrointestinal conditions.  Asthma or other breathing conditions.  Restless legs syndrome, sleep apnea, or other sleep disorders.  Chronic pain.  Menopause. This may include hot flashes.  Stroke.  Abuse of alcohol, tobacco, or illegal drugs.  Depression.  Caffeine.  Neurological disorders, such as Alzheimer disease.  An overactive thyroid (hyperthyroidism).  The cause of insomnia may  not be known. What increases the risk? Risk factors for insomnia include:  Gender. Women are more commonly affected than men.  Age. Insomnia is more common as you get older.  Stress. This may involve your professional or personal life.  Income. Insomnia is more common in people with lower income.  Lack of exercise.  Irregular work schedule or night shifts.  Traveling between different time zones.  What are the signs or symptoms? If you have insomnia, trouble falling asleep or trouble staying asleep is the main symptom. This may lead to other symptoms, such as:  Feeling fatigued.  Feeling nervous about going to sleep.  Not feeling rested in the morning.  Having trouble concentrating.  Feeling irritable, anxious, or depressed.  How is this treated? Treatment for insomnia depends on the cause. If your insomnia is caused by an underlying condition, treatment will focus on addressing the condition. Treatment may also include:  Medicines to help you sleep.  Counseling or therapy.  Lifestyle adjustments.  Follow these instructions at home:  Take medicines only as directed by your health care provider.  Keep regular sleeping and waking hours. Avoid naps.  Keep a sleep diary to help you and your health care provider figure out what could be causing your insomnia. Include: ? When you sleep. ? When you wake up during the night. ? How well you sleep. ? How rested you feel the next day. ? Any side effects of medicines you are taking. ? What you eat and drink.  Make your bedroom a comfortable place where it is easy to fall asleep: ? Put up  shades or special blackout curtains to block light from outside. ? Use a white noise machine to block noise. ? Keep the temperature cool.  Exercise regularly as directed by your health care provider. Avoid exercising right before bedtime.  Use relaxation techniques to manage stress. Ask your health care provider to suggest some  techniques that may work well for you. These may include: ? Breathing exercises. ? Routines to release muscle tension. ? Visualizing peaceful scenes.  Cut back on alcohol, caffeinated beverages, and cigarettes, especially close to bedtime. These can disrupt your sleep.  Do not overeat or eat spicy foods right before bedtime. This can lead to digestive discomfort that can make it hard for you to sleep.  Limit screen use before bedtime. This includes: ? Watching TV. ? Using your smartphone, tablet, and computer.  Stick to a routine. This can help you fall asleep faster. Try to do a quiet activity, brush your teeth, and go to bed at the same time each night.  Get out of bed if you are still awake after 15 minutes of trying to sleep. Keep the lights down, but try reading or doing a quiet activity. When you feel sleepy, go back to bed.  Make sure that you drive carefully. Avoid driving if you feel very sleepy.  Keep all follow-up appointments as directed by your health care provider. This is important. Contact a health care provider if:  You are tired throughout the day or have trouble in your daily routine due to sleepiness.  You continue to have sleep problems or your sleep problems get worse. Get help right away if:  You have serious thoughts about hurting yourself or someone else. This information is not intended to replace advice given to you by your health care provider. Make sure you discuss any questions you have with your health care provider. Document Released: 10/02/2000 Document Revised: 03/06/2016 Document Reviewed: 07/06/2014 Elsevier Interactive Patient Education  [employer].   [phone] [address], [place], [place] [address]     [facility] [phone] [place], [place], [place] [address]

## 2017-06-04 NOTE — Telephone Encounter (Signed)
See msg, please

## 2017-06-04 NOTE — Progress Notes (Signed)
Subjective: Chief Complaint  Patient presents with  . discuss  having  u/s for her stomach    discuss having u/s for her stomach , follow up cardiology notes   Here for several concerns.  Insomnia - Takes Xanax for sleep or anxious feeling prn, maybe 3-4 nights per week.   Not taking Trazodone but maybe once monthly.  dysthymia - Not taking Fluoxetine, quit taking this 2 months ago.  Mood - agitated some.  Stays home a lot.  Not really down.   Gout - Taking Allopurinol 300mg  daily.   Uses Colcrys 2-3 times per week.  Sometimes Advil too. Reports knees ache somewhat regularly.  Hypothyroidism - she is not sure, but she thinks she is taking Synthroid 61mcg daily  Edema, CHF - compliant with her BP and heart medications, not taking lasix unless worse swelling.  Abdominal and pelvic pain - She notes lower abdominal pain.  Has to bend over due to pain.   Urinary - she notes some urinary frequency.  No blood in urine, no odor in urine, no burning with urination, no incontinence.   Having some nausea, some vomiting in the last week, some decreased appetite.   Stools are soft, not loose.  But only gets a little bit of stool.  Has bad lower stomach cramps..  S/p hysterectomy but still has ovaries.  She notes prior issue with umbilical hernia, had drain or flush put in, ever since gets pains and fullness in right lower abdomen.  Home health nurse not coming out.  She told them to stop coming out.   numbness from [place], [place], [place].  Past Medical History:  Diagnosis Date  . Anemia   . Ankle fracture, right    x 2  . Antineoplastic and immunosuppressive drugs causing adverse effect in therapeutic use   . Asthma   . Atrial fibrillation ([place])   . Chronic venous hypertension without complications   . Colon polyps   . Colon polyps 2009  . Coronary artery disease    no CAD by cath 11.2010  . Depressive disorder, not elsewhere classified   . Diastolic CHF, chronic ([place])   .  Diverticulosis of colon 2009   colonoscopy  . Dyslipidemia   . Glucose intolerance (impaired glucose tolerance)    hgb AIC 6.1 09/02/2009  . Gout   . Hepatitis, unspecified    hx of  . Hx of hysterectomy 2002  . Hyperlipidemia   . Hypertension   . Irritable bowel syndrome   . Lung nodule    17mm RLL nodule on CT Chest  07/2009, resolved by 2011 CT  . Noncompliance   . Obesity   . Persistent atrial fibrillation ([place])    s/p DCCV 07/2009; Pradaxa Rx  . PONV (postoperative nausea and vomiting)   . Sarcoidosis    dx by eye exam  . Sleep disturbance 06/2013   sleep study, mild abnormality, no criteria for CPAP   Past Surgical History:  Procedure Laterality Date  . ABDOMINAL HYSTERECTOMY    . CARDIOVERSION     x 2  . CARDIOVERSION N/A 02/27/2013   Procedure: CARDIOVERSION;  Surgeon: Lelon Perla, MD;  Location: [facility] ENDOSCOPY;  Service: Cardiovascular;  Laterality: N/A;  . CARDIOVERSION N/A 03/24/2017   Procedure: CARDIOVERSION;  Surgeon: Josue Hector, MD;  Location: [facility] ENDOSCOPY;  Service: Cardiovascular;  Laterality: N/A;  . CHOLECYSTECTOMY    . COLONOSCOPY     Diverticulosis, colon polyps, repeat 2014, Dr. Deatra Ina  . ESOPHAGOGASTRODUODENOSCOPY N/A  05/16/2014   Procedure: ESOPHAGOGASTRODUODENOSCOPY (EGD);  Surgeon: Wonda Horner, MD;  Location: [facility] ORS;  Service: Gastroenterology;  Laterality: N/A;  . HERNIA REPAIR  2009   . Current Outpatient Prescriptions on File Prior to Visit  Medication Sig Dispense Refill  . allopurinol (ZYLOPRIM) 300 MG tablet TAKE 300 MG BY MOUTH DAILY AS NEEDED FOR GOUT 90 tablet 2  . ALPRAZolam (XANAX) 1 MG tablet Take 1 tablet (1 mg total) by mouth at bedtime as needed for anxiety. 45 tablet 2  . budesonide-formoterol (SYMBICORT) 160-4.5 MCG/ACT inhaler Inhale 2 puffs into the lungs 2 (two) times daily. 1 Inhaler 2  . carvedilol (COREG) 25 MG tablet Take 1 tablet (25 mg total) by mouth 2 (two) times daily. 60 tablet 11  . colchicine (COLCRYS) 0.6  MG tablet Take 1 tablet (0.6 mg total) by mouth daily as needed. Reported on 01/21/2016 (Patient taking differently: Take 0.6 mg by mouth daily as needed (GOUT). Reported on 01/21/2016) 45 tablet 1  . FLUoxetine (PROZAC) 20 MG tablet TAKE 1 TABLET(20 MG) BY MOUTH DAILY 90 tablet 0  . furosemide (LASIX) 40 MG tablet Take 1.5 tablets (60 mg total) by mouth daily. 45 tablet 6  . levothyroxine (SYNTHROID, LEVOTHROID) 25 MCG tablet Take 1 tablet (25 mcg total) by mouth daily before breakfast. 30 tablet 1  . metoprolol succinate (TOPROL-XL) 50 MG 24 hr tablet Take 1 tablet (50 mg total) by mouth 2 (two) times daily. Take with or immediately following a meal. 60 tablet 6  . potassium chloride SA (K-DUR,KLOR-CON) 20 MEQ tablet Take 1 tablet (20 mEq total) by mouth daily. 90 tablet 3  . PRADAXA 150 MG CAPS capsule TAKE ONE CAPSULE BY MOUTH TWICE DAILY 60 capsule 5  . rosuvastatin (CRESTOR) 20 MG tablet Take 1 tablet (20 mg total) by mouth daily. 90 tablet 3  . sacubitril-valsartan (ENTRESTO) 24-26 MG Take 1 tablet by mouth 2 (two) times daily. 60 tablet 0  . traZODone (DESYREL) 50 MG tablet TAKE 1/2 TO 1 TABLET BY MOUTH EVERY NIGHT AT BEDTIME AS NEEDED FOR SLEEP 90 tablet 3  . VENTOLIN HFA 108 (90 Base) MCG/ACT inhaler INHALE 2 PUFFS INTO THE LUNGS EVERY 6 HOURS AS NEEDED FOR WHEEZING OR SHORTNESS OF BREATH 18 g 0  . [DISCONTINUED] propranolol (INDERAL) 60 MG tablet Take 60 mg by mouth daily.       No current facility-administered medications on file prior to visit.     ROS as in subjective   Objective: BP 134/72   Pulse 89   Wt 256 lb (116.1 kg)   SpO2 98%   BMI 45.35 kg/m   Wt Readings from Last 3 Encounters:  06/04/17 256 lb (116.1 kg)  05/19/17 261 lb (118.4 kg)  05/10/17 246 lb (111.6 kg)   General appearance: alert, no distress, WD/WN,  HEENT: normocephalic, sclerae anicteric, TMs pearly, nares patent, no discharge or erythema, pharynx normal Oral cavity: MMM, no lesions Neck: supple, no  lymphadenopathy, no thyromegaly, no masses Heart: RRR, normal S1, S2, no murmurs Lungs: CTA bilaterally, no wheezes, rhonchi, or rales Abdomen: +bs, soft, large diagonal RUQ surgical scar, tender along lower abdomen throughout, no swelling,  Non distended, no masses, no hepatomegaly, no splenomegaly Pulses: 1+ symmetric, upper and lower extremities, normal cap refill Psych: pleasant, answers questions appropriately   Assessment: Encounter Diagnoses  Name Primary?  . Abdominal pain, unspecified abdominal location   . Nausea   . Chronic gout without tophus, unspecified cause, unspecified site Yes  .  Pelvic pain in female   . Dysthymic disorder   . Thyroid disease   . Type 2 diabetes mellitus with complication, without long-term current use of insulin ([place])   . CHRONIC DIASTOLIC HEART FAILURE   . Insomnia, unspecified type   . Poor sleep hygiene   . Noncompliance     Plan: Dysthymic disorder, insomnia - part of her problem is poor sleep hygiene.   Of note, we tried to have home health do home visits and medication reconciliation but she gets aggravated with the people and phone calls, and ultimately stopped them from coming out.  Reviewed sleep hygeine recommendations.  For the time begin c/t prn Xanax for sleep.  Stop trazodone, and she already stopped Fluoxetine.  Abdominal and pelvic pain, chronic.  Referral back to GI.   Labs today  Gout - recheck uric acid.  She is using Colcrys 3-4 times per week which may be contributing to the loose stool.   Advised pending labs, we may need to change to Uloric or increase Allopurinol, but I want her using less Colcrys.  Thyroid disease - labs today, will likely need to tritiate up the dose.  Diabetes - recheck HgbA1C today  CHF - managed by cardiology.   Recommendations: Abdomen and pelvic pain - we are checking labs today.   We are referring you back to gastroentology to help determine why you are still getting pain  Gout - continue  Allopurinol daily for PREVENTION.   Try to only use Colcrys sparingly for flare ups.   Since you are using this 3-4 times per week, I am checking your uric acid levels to see if we need to modify your prevention medication.   We will call back about this.  Thyroid disease - we will call back with lab levels.  We will likely need to increase the Levothyroxine/Synthroid dose.  Currently you are taking 54mcg daily.   Sleep - for now continue Xanax for as needed use, but we may change this.  STOP THESE MEDICATIONS Trazodone Fluoxetine   Stephanie [name] was seen today for discuss  having  u/s for her stomach.  Diagnoses and all orders for this visit:  Chronic gout without tophus, unspecified cause, unspecified site -     Uric acid  Abdominal pain, unspecified abdominal location -     Cancel: CT Abdomen Pelvis Wo Contrast; Future -     Ambulatory referral to Gastroenterology  Nausea  Pelvic pain in female -     Cancel: CT Abdomen Pelvis Wo Contrast; Future -     Ambulatory referral to Gastroenterology  Dysthymic disorder  Thyroid disease -     TSH -     T4, Free -     T3  Type 2 diabetes mellitus with complication, without long-term current use of insulin (HCC) -     Hemoglobin R8X  CHRONIC DIASTOLIC HEART FAILURE  Insomnia, unspecified type  Poor sleep hygiene  Noncompliance

## 2017-06-04 NOTE — Telephone Encounter (Signed)
The pt has to call to stop atuo refill on her meds ,called pharmacy to d/c meds.

## 2017-06-04 NOTE — Telephone Encounter (Signed)
Please call her pharmacy [facility] and make sure they are aware to discontinue Fluoxetine and Trazodone.

## 2017-06-05 LAB — HEMOGLOBIN A1C
HEMOGLOBIN A1C: 6.7 % — AB (ref <5.7)
MEAN PLASMA GLUCOSE: 146 mg/dL

## 2017-06-05 LAB — T3: T3, Total: 122.5 ng/dL (ref 76–181)

## 2017-06-05 LAB — URIC ACID: URIC ACID, SERUM: 5.6 mg/dL (ref 2.5–7.0)

## 2017-06-14 ENCOUNTER — Telehealth: Payer: Self-pay | Admitting: Medical

## 2017-06-14 ENCOUNTER — Other Ambulatory Visit: Payer: Self-pay | Admitting: Medical

## 2017-06-14 MED ORDER — LEVOTHYROXINE SODIUM 50 MCG PO TABS: 50.0000 ug | ORAL_TABLET | Freq: Every day | ORAL | 1 refills | Status: DC

## 2017-06-14 MED ORDER — LEVOTHYROXINE SODIUM 100 MCG PO TABS: 100.0000 ug | ORAL_TABLET | Freq: Every day | ORAL | 1 refills | Status: DC

## 2017-06-14 NOTE — Telephone Encounter (Signed)
As a follow up from last visit, make sure she has referral appt with GI, and per labs from the Friday before I went on vacation, Dr. Redmond School had her double the thyroid dose and f/u 58mo.  Make sure is taking Synthroid 2mcg, 2 tablets daily (equal to 50 mcg daily).  I sent out 41mcg, as I don't see refill in chart.

## 2017-06-14 NOTE — Telephone Encounter (Signed)
This was handle by dr.[email or web address] when you where out.pt is aware of this

## 2017-06-18 ENCOUNTER — Ambulatory Visit: Payer: Medicare Other | Admitting: Internal Medicine

## 2017-06-26 ENCOUNTER — Other Ambulatory Visit: Payer: Self-pay | Admitting: Medical

## 2017-06-28 ENCOUNTER — Other Ambulatory Visit: Payer: Self-pay

## 2017-06-28 MED ORDER — SACUBITRIL-VALSARTAN 24-26 MG PO TABS: 1.0000 | ORAL_TABLET | Freq: Two times a day (BID) | ORAL | 11 refills | Status: DC

## 2017-07-08 ENCOUNTER — Other Ambulatory Visit: Payer: Self-pay | Admitting: Internal Medicine

## 2017-07-08 NOTE — Telephone Encounter (Signed)
Patient Instructions by Bobby Rumpf, CMA at 05/05/2017 9:30 AM   Author: Bobby Rumpf, CMA Author Type: Certified Medical Assistant Filed: 05/05/2017 11:19 AM  Note Status: Addendum Cosign: Cosign Not Required Encounter Date: 05/05/2017  Editor: Emily Filbert, RN (Registered Nurse)  Prior Versions: 1. Emily Filbert, RN (Registered Nurse) at 05/05/2017 11:13 AM - Addendum   2. Bobby Rumpf, CMA (Certified Medical Assistant) at 05/05/2017 11:12 AM - Signed    Medication Instructions: Your physician has recommended you make the following change in your medication:  -1) START Entresto 24/26 mg - Take 1 tablet by mouth twice daily - Don't take your first dose until Friday 05/07/17 -2) STOP Losartan Potassium

## 2017-07-18 ENCOUNTER — Encounter (HOSPITAL_COMMUNITY): Payer: Self-pay | Admitting: Emergency Medicine

## 2017-07-18 ENCOUNTER — Emergency Department (HOSPITAL_COMMUNITY)
Admission: EM | Admit: 2017-07-18 | Discharge: 2017-07-18 | Disposition: A | Discharge status: Home / Self Care | Payer: Medicare Other | Attending: Emergency Medicine | Admitting: Emergency Medicine

## 2017-07-18 ENCOUNTER — Emergency Department (HOSPITAL_COMMUNITY): Payer: Medicare Other

## 2017-07-18 DIAGNOSIS — Y929 Unspecified place or not applicable: Secondary | ICD-10-CM | POA: Insufficient documentation

## 2017-07-18 DIAGNOSIS — Y999 Unspecified external cause status: Secondary | ICD-10-CM | POA: Diagnosis not present

## 2017-07-18 DIAGNOSIS — X58XXXA Exposure to other specified factors, initial encounter: Secondary | ICD-10-CM | POA: Insufficient documentation

## 2017-07-18 DIAGNOSIS — J45909 Unspecified asthma, uncomplicated: Secondary | ICD-10-CM | POA: Diagnosis not present

## 2017-07-18 DIAGNOSIS — S91301A Unspecified open wound, right foot, initial encounter: Secondary | ICD-10-CM | POA: Diagnosis not present

## 2017-07-18 DIAGNOSIS — Y939 Activity, unspecified: Secondary | ICD-10-CM | POA: Diagnosis not present

## 2017-07-18 DIAGNOSIS — E119 Type 2 diabetes mellitus without complications: Secondary | ICD-10-CM | POA: Diagnosis not present

## 2017-07-18 DIAGNOSIS — Z79899 Other long term (current) drug therapy: Secondary | ICD-10-CM | POA: Diagnosis not present

## 2017-07-18 DIAGNOSIS — I5021 Acute systolic (congestive) heart failure: Secondary | ICD-10-CM | POA: Diagnosis not present

## 2017-07-18 DIAGNOSIS — I2510 Atherosclerotic heart disease of native coronary artery without angina pectoris: Secondary | ICD-10-CM | POA: Insufficient documentation

## 2017-07-18 DIAGNOSIS — S99921A Unspecified injury of right foot, initial encounter: Secondary | ICD-10-CM | POA: Diagnosis present

## 2017-07-18 DIAGNOSIS — I110 Hypertensive heart disease with heart failure: Secondary | ICD-10-CM | POA: Diagnosis not present

## 2017-07-18 DIAGNOSIS — M79671 Pain in right foot: Secondary | ICD-10-CM | POA: Diagnosis not present

## 2017-07-18 LAB — CBG MONITORING, ED: GLUCOSE-CAPILLARY: 197 mg/dL — AB (ref 65–99)

## 2017-07-18 MED ORDER — DOXYCYCLINE HYCLATE 100 MG PO CAPS: 100.0000 mg | ORAL_CAPSULE | Freq: Two times a day (BID) | ORAL | 0 refills | Status: DC

## 2017-07-18 MED ORDER — OXYCODONE-ACETAMINOPHEN 5-325 MG PO TABS
1.0000 | ORAL_TABLET | Freq: Once | ORAL | Status: AC
  Administered 2017-07-18: 1 via ORAL
  Filled 2017-07-18: qty 1

## 2017-07-18 MED ORDER — ACETAMINOPHEN 325 MG PO TABS
650.0000 mg | ORAL_TABLET | Freq: Once | ORAL | Status: AC
  Administered 2017-07-18: 650 mg via ORAL
  Filled 2017-07-18: qty 2

## 2017-07-18 NOTE — Discharge Instructions (Signed)
Your x-ray was normal today, no involvement of bone from wound.   Take antibiotic as prescribed. Keep wound clean: rinse with water and soap at least twice daily. Place antibiotic ointment to wound at least twice daily.   For pain take 600 mg ibuprofen + 1000 mg acetaminophen every 6-8 hours for pain.   Call your primary care provider and make an appointment for discussion of diabetes and foot care. You should follow up with podiatry (foot doctor) for routine diabetic foot care.

## 2017-07-18 NOTE — ED Provider Notes (Signed)
[facility] DEPT Provider Note   CSN: [id number] Arrival date & time: 07/18/17  0429     History   Chief Complaint Chief Complaint  Patient presents with  . Foot Pain    HPI Stephanie [name] is a 66 y.o. female with h/o right ankle fx, gout on allopurinol, borderline T2DM presents to ED for wound to right medial foot/ankle x 1 month associated with pain and mild local swelling. Has been taking ibuprofen without relief of pain. Has applied antibiotic ointment to wound. States wound oozed yellow drainage several days ago but not since. States pain is worst at wound surface but feels "deeper in my bone". Was told last month that her BG were elevated again and needed to start taking metformin again. Denies chills, fevers, recent trauma to ankle. States her gout flares are usually in her big right toe, never in ankle before. Cannot tell if this episode feels like gout. No IVDU.   HPI  Past Medical History:  Diagnosis Date  . Anemia   . Ankle fracture, right    x 2  . Antineoplastic and immunosuppressive drugs causing adverse effect in therapeutic use   . Asthma   . Atrial fibrillation ([place])   . Chronic venous hypertension without complications   . Colon polyps   . Colon polyps 2009  . Coronary artery disease    no CAD by cath 11.2010  . Depressive disorder, not elsewhere classified   . Diastolic CHF, chronic ([place])   . Diverticulosis of colon 2009   colonoscopy  . Dyslipidemia   . Glucose intolerance (impaired glucose tolerance)    hgb AIC 6.1 09/02/2009  . Gout   . Hepatitis, unspecified    hx of  . Hx of hysterectomy 2002  . Hyperlipidemia   . Hypertension   . Irritable bowel syndrome   . Lung nodule    44mm RLL nodule on CT Chest  07/2009, resolved by 2011 CT  . Noncompliance   . Obesity   . Persistent atrial fibrillation ([place])    s/p DCCV 07/2009; Pradaxa Rx  . PONV (postoperative nausea and vomiting)   . Sarcoidosis    dx by eye exam  . Sleep disturbance  06/2013   sleep study, mild abnormality, no criteria for CPAP    Patient Active Problem List   Diagnosis Date Noted  . Abdominal pain 06/04/2017  . Nausea 06/04/2017  . Poor sleep hygiene 06/04/2017  . Noncompliance 06/04/2017  . Thyroid disease 05/19/2017  . Typical atrial flutter ([place])   . Thyrotoxicosis 11/24/2016  . Atrial fibrillation with rapid ventricular response ([place]) 11/12/2016  . Demand ischemia ([place]) 11/12/2016  . Chronic diarrhea 11/12/2016  . Atrial fibrillation with RVR ([place]) 11/12/2016  . Acute systolic CHF (congestive heart failure) ([place])   . Acute on chronic systolic CHF (congestive heart failure) ([place]) 10/18/2016  . Hypokalemia 10/18/2016  . Need for prophylactic vaccination against Streptococcus pneumoniae (pneumococcus) 08/20/2016  . Type 2 diabetes mellitus with complication, without long-term current use of insulin ([place]) 05/12/2016  . Edema 05/12/2016  . B12 deficiency 05/06/2016  . Normocytic anemia 04/15/2016  . Insomnia 01/21/2016  . Pelvic pain in female 08/06/2015  . Screening for breast cancer 08/06/2015  . History of anemia 08/02/2015  . Other specified nutritional anemias 08/02/2015  . Urinary pain 08/02/2015  . Need for prophylactic vaccination and inoculation against influenza 08/02/2015  . Obesity 08/02/2015  . Dyslipidemia 08/02/2015  . History of sarcoidosis 08/02/2015  . Generalized anxiety disorder 08/02/2015  .  Gout 08/02/2015  . Abnormal thyroid blood test 08/02/2015  . Physical exam 08/02/2015  . Needs flu shot 08/02/2015  . Dysthymic disorder 08/02/2015  . Ataxia 08/22/2014  . Cardiomyopathy, secondary ([place]) 01/28/2011  . Acute on chronic systolic heart failure ([place]) 01/28/2011  . CHRONIC DIASTOLIC HEART FAILURE 96/29/5284  . Essential hypertension 09/02/2009  . Atrial fibrillation ([place]) 08/20/2009  . Lung nodule 08/20/2009  . CHRONIC VENOUS HYPERTENSION WITHOUT COMPS 11/21/2007    Past Surgical History:  Procedure  Laterality Date  . ABDOMINAL HYSTERECTOMY    . CARDIOVERSION     x 2  . CARDIOVERSION N/A 02/27/2013   Procedure: CARDIOVERSION;  Surgeon: Lelon Perla, MD;  Location: [facility] ENDOSCOPY;  Service: Cardiovascular;  Laterality: N/A;  . CARDIOVERSION N/A 03/24/2017   Procedure: CARDIOVERSION;  Surgeon: Josue Hector, MD;  Location: [facility] ENDOSCOPY;  Service: Cardiovascular;  Laterality: N/A;  . CHOLECYSTECTOMY    . COLONOSCOPY     Diverticulosis, colon polyps, repeat 2014, Dr. Deatra Ina  . ESOPHAGOGASTRODUODENOSCOPY N/A 05/16/2014   Procedure: ESOPHAGOGASTRODUODENOSCOPY (EGD);  Surgeon: Wonda Horner, MD;  Location: [facility] ORS;  Service: Gastroenterology;  Laterality: N/A;  . HERNIA REPAIR  2009    OB History    No data available       Home Medications    Prior to Admission medications   Medication Sig Start Date End Date Taking? Authorizing Provider  allopurinol (ZYLOPRIM) 300 MG tablet TAKE 300 MG BY MOUTH DAILY AS NEEDED FOR GOUT 02/22/17   Tysinger, Camelia Eng, PA-C  ALPRAZolam Duanne Moron) 1 MG tablet Take 1 tablet (1 mg total) by mouth at bedtime as needed for anxiety. 02/19/17   Tysinger, Camelia Eng, PA-C  carvedilol (COREG) 25 MG tablet Take 1 tablet (25 mg total) by mouth 2 (two) times daily. 11/24/16   Imogene Burn, PA-C  colchicine (COLCRYS) 0.6 MG tablet Take 1 tablet (0.6 mg total) by mouth daily as needed. Reported on 01/21/2016 Patient taking differently: Take 0.6 mg by mouth daily as needed (GOUT). Reported on 01/21/2016 01/21/16   Tysinger, Camelia Eng, PA-C  doxycycline (VIBRAMYCIN) 100 MG capsule Take 1 capsule (100 mg total) by mouth 2 (two) times daily. 07/18/17   Kinnie Feil, PA-C  furosemide (LASIX) 40 MG tablet Take 1.5 tablets (60 mg total) by mouth daily. 05/07/17   Deboraha Sprang, MD  levothyroxine (SYNTHROID, LEVOTHROID) 100 MCG tablet Take 1 tablet (100 mcg total) by mouth daily. 06/14/17   Tysinger, Camelia Eng, PA-C  levothyroxine (SYNTHROID, LEVOTHROID) 50 MCG tablet Take 1 tablet (50 mcg  total) by mouth daily. 06/14/17   Tysinger, Camelia Eng, PA-C  metoprolol succinate (TOPROL-XL) 50 MG 24 hr tablet Take 1 tablet (50 mg total) by mouth 2 (two) times daily. Take with or immediately following a meal. 05/07/17 08/05/17  Deboraha Sprang, MD  potassium chloride SA (K-DUR,KLOR-CON) 20 MEQ tablet Take 1 tablet (20 mEq total) by mouth daily. 03/31/17   Tysinger, Camelia Eng, PA-C  PRADAXA 150 MG CAPS capsule TAKE ONE CAPSULE BY MOUTH TWICE DAILY 12/14/16   Deboraha Sprang, MD  rosuvastatin (CRESTOR) 20 MG tablet Take 1 tablet (20 mg total) by mouth daily. 03/31/17   Tysinger, Camelia Eng, PA-C  sacubitril-valsartan (ENTRESTO) 24-26 MG Take 1 tablet by mouth 2 (two) times daily. 06/28/17   Deboraha Sprang, MD  SYMBICORT 160-4.5 MCG/ACT inhaler INHALE 2 PUFFS INTO THE LUNGS TWICE DAILY 06/28/17   Tysinger, Camelia Eng, PA-C  VENTOLIN HFA 108 [phone] Base) MCG/ACT  inhaler INHALE 2 PUFFS INTO THE LUNGS EVERY 6 HOURS AS NEEDED FOR WHEEZING OR SHORTNESS OF BREATH 02/02/17   Tysinger, Camelia Eng, PA-C    Family History Family History  Problem Relation Age of Onset  . Pneumonia Father        deceased  . Hypertension Father   . Cancer Father   . Multiple sclerosis Mother        hx  . Emphysema Other        runs in the family    Social History Social History  Substance Use Topics  . Smoking status: Never Smoker  . Smokeless tobacco: Never Used  . Alcohol use No     Allergies   Albuterol; Amiodarone; Atorvastatin; Livalo [pitavastatin]; and Propoxyphene n-acetaminophen   Review of Systems Review of Systems  Constitutional: Negative for chills and fever.  Musculoskeletal: Positive for gait problem and joint swelling.  Skin: Positive for color change and wound.  Allergic/Immunologic: Positive for immunocompromised state.  Neurological: Negative for numbness.     Physical Exam Updated Vital Signs BP (!) 150/120 (BP Location: Left Arm)   Pulse (!) 55   Temp 97.9 F (36.6 C) (Oral)   Resp 18   Ht 5'  3" (1.6 m)   Wt 119.4 kg (263 lb 4.8 oz)   SpO2 98%   BMI 46.64 kg/m   Physical Exam  Constitutional: She is oriented to person, place, and time. She appears well-developed and well-nourished. No distress.  NAD.  HENT:  Head: Normocephalic and atraumatic.  Right Ear: External ear normal.  Left Ear: External ear normal.  Nose: Nose normal.  Eyes: Conjunctivae and EOM are normal. No scleral icterus.  Neck: Normal range of motion. Neck supple.  Cardiovascular: Normal rate, regular rhythm and normal heart sounds.   No murmur heard. Pulses:      Dorsalis pedis pulses are 2+ on the right side, and 2+ on the left side.       Posterior tibial pulses are 2+ on the right side, and 2+ on the left side.  No LE edema or calf tenderness  Pulmonary/Chest: Effort normal and breath sounds normal. She has no wheezes.  Musculoskeletal: Normal range of motion. She exhibits no deformity.  Neurological: She is alert and oriented to person, place, and time.  Skin: Skin is warm and dry. Capillary refill takes less than 2 seconds. Lesion noted.  1 cm x 0.5 cm dry, ulceration lesion to right medial foot/ankle with local tenderness and mild edema and erythema, no drainage Full PROM of ankle with mild pain  Psychiatric: She has a normal mood and affect. Her behavior is normal. Judgment and thought content normal.  Sensation light touch intact in feet bilaterally  Nursing note and vitals reviewed.    ED Treatments / Results  Labs (all labs ordered are listed, but only abnormal results are displayed) Labs Reviewed  CBG MONITORING, ED - Abnormal; Notable for the following:       Result Value   Glucose-Capillary 197 (*)    All other components within normal limits    EKG  EKG Interpretation None       Radiology Dg Ankle Complete Right  Result Date: 07/18/2017 CLINICAL DATA:  Medial midfoot pain for 1 month. History of diabetes. EXAM: RIGHT ANKLE - COMPLETE 3+ VIEW COMPARISON:  Radiographs  08/31/2009. FINDINGS: No evidence of acute fracture, dislocation or bone destruction. Stable probable posttraumatic deformity of the distal fibula and tibiotalar degenerative changes. No focal soft tissue  ulceration, foreign body or emphysema identified. IMPRESSION: Stable radiographic appearance of the right ankle. No evidence of osteomyelitis. Electronically Signed   By: Richardean Sale M.D.   On: 07/18/2017 09:33    Procedures Procedures (including critical care time)  Medications Ordered in ED Medications  oxyCODONE-acetaminophen (PERCOCET/ROXICET) 5-325 MG per tablet 1 tablet (1 tablet Oral Given 07/18/17 1003)  acetaminophen (TYLENOL) tablet 650 mg (650 mg Oral Given 07/18/17 1003)     Initial Impression / Assessment and Plan / ED Course  I have reviewed the triage vital signs and the nursing notes.  Pertinent labs & imaging results that were available during my care of the patient were reviewed by me and considered in my medical decision making (see chart for details).    66 year old female with medical history significant for borderline diabetes presents to ED for evaluation of wound to left medial foot/ankle times one month. On exam, she has an ulcer with local, mild erythema, edema and tenderness. She has full passive range of motion of the joint with minimal pain. No fevers or chills. Extremities neurovascularly intact. She has history of gout and previous right ankle fractures, however she's never had a gout flare in her ankle. She denies trauma to the joint. X-ray is unremarkable. Given local erythema, edema and tenderness we'll discharged with doxycycline, podiatry follow-up and PCP follow-up. Discussed signs and symptoms that would warrant return to the ED.  Final Clinical Impressions(s) / ED Diagnoses   Final diagnoses:  Wound of right foot    New Prescriptions New Prescriptions   DOXYCYCLINE (VIBRAMYCIN) 100 MG CAPSULE    Take 1 capsule (100 mg total) by mouth 2 (two)  times daily.     Kinnie Feil, PA-C 07/18/17 1010    Lacretia Leigh, MD 07/18/17 1311

## 2017-07-18 NOTE — ED Notes (Signed)
Bed: WTR6 Expected date:  Expected time:  Means of arrival:  Comments: 

## 2017-07-18 NOTE — ED Triage Notes (Addendum)
Patient complaining of right foot pain and sore. Patient has a spot that was a scratch and has gotten worse. Patient is diabetic.

## 2017-07-18 NOTE — ED Notes (Signed)
Pt c/o foot pain a month ago that has gotten worse the last couple days. She got a cut that hasn't fully healed and is swollen. Hx of diabetes, CHF, and gout.

## 2017-07-20 NOTE — Telephone Encounter (Signed)
Done

## 2017-07-29 ENCOUNTER — Other Ambulatory Visit: Payer: Self-pay | Admitting: Medical

## 2017-07-29 NOTE — Telephone Encounter (Signed)
Is this okay to refill? 

## 2017-08-13 DIAGNOSIS — Z23 Encounter for immunization: Secondary | ICD-10-CM | POA: Diagnosis not present

## 2017-08-13 DIAGNOSIS — S90819A Abrasion, unspecified foot, initial encounter: Secondary | ICD-10-CM | POA: Diagnosis not present

## 2017-08-13 DIAGNOSIS — E039 Hypothyroidism, unspecified: Secondary | ICD-10-CM | POA: Diagnosis not present

## 2017-08-13 DIAGNOSIS — I4891 Unspecified atrial fibrillation: Secondary | ICD-10-CM | POA: Diagnosis not present

## 2017-08-16 ENCOUNTER — Other Ambulatory Visit: Payer: Self-pay | Admitting: Internal Medicine

## 2017-08-18 ENCOUNTER — Ambulatory Visit (INDEPENDENT_AMBULATORY_CARE_PROVIDER_SITE_OTHER): Payer: Medicare Other | Admitting: Podiatry

## 2017-08-18 ENCOUNTER — Ambulatory Visit (INDEPENDENT_AMBULATORY_CARE_PROVIDER_SITE_OTHER): Payer: Medicare Other

## 2017-08-18 VITALS — BP 149/88 | HR 66 | Ht 63.0 in | Wt 263.0 lb

## 2017-08-18 DIAGNOSIS — E11622 Type 2 diabetes mellitus with other skin ulcer: Secondary | ICD-10-CM

## 2017-08-18 DIAGNOSIS — D689 Coagulation defect, unspecified: Secondary | ICD-10-CM | POA: Diagnosis not present

## 2017-08-18 DIAGNOSIS — L97309 Non-pressure chronic ulcer of unspecified ankle with unspecified severity: Secondary | ICD-10-CM

## 2017-08-18 DIAGNOSIS — B351 Tinea unguium: Secondary | ICD-10-CM | POA: Diagnosis not present

## 2017-08-18 DIAGNOSIS — E1151 Type 2 diabetes mellitus with diabetic peripheral angiopathy without gangrene: Secondary | ICD-10-CM

## 2017-08-18 NOTE — Progress Notes (Addendum)
Subjective:    Patient ID: Stephanie [name], female    DOB: 08-05-1951, 66 y.o.   MRN: [id number]  HPI  Chief Complaint  Patient presents with  . Diabetes    bilateral elongated thickened toenails  . Diabetic Ulcer    Right foot medial, near arch  . Foot Pain    right foot   66 y.o. female presents with the above complaint.  States that she has a wound to her right foot near her arch.  States it started as a scratch.  States the wound has been deeper before.  Has been getting smaller.  Does not regularly check her sugars.  Denies cramping denies burning in her feet Past Medical History:  Diagnosis Date  . Anemia   . Ankle fracture, right    x 2  . Antineoplastic and immunosuppressive drugs causing adverse effect in therapeutic use   . Asthma   . Atrial fibrillation ([place])   . Chronic venous hypertension without complications   . Colon polyps   . Colon polyps 2009  . Coronary artery disease    no CAD by cath 11.2010  . Depressive disorder, not elsewhere classified   . Diastolic CHF, chronic ([place])   . Diverticulosis of colon 2009   colonoscopy  . Dyslipidemia   . Glucose intolerance (impaired glucose tolerance)    hgb AIC 6.1 09/02/2009  . Gout   . Hepatitis, unspecified    hx of  . Hx of hysterectomy 2002  . Hyperlipidemia   . Hypertension   . Irritable bowel syndrome   . Lung nodule    76mm RLL nodule on CT Chest  07/2009, resolved by 2011 CT  . Noncompliance   . Obesity   . Persistent atrial fibrillation ([place])    s/p DCCV 07/2009; Pradaxa Rx  . PONV (postoperative nausea and vomiting)   . Sarcoidosis    dx by eye exam  . Sleep disturbance 06/2013   sleep study, mild abnormality, no criteria for CPAP   Past Surgical History:  Procedure Laterality Date  . ABDOMINAL HYSTERECTOMY    . CARDIOVERSION     x 2  . CHOLECYSTECTOMY    . COLONOSCOPY     Diverticulosis, colon polyps, repeat 2014, Dr. Deatra Ina  . HERNIA REPAIR  2009    Current Outpatient  Medications:  .  allopurinol (ZYLOPRIM) 300 MG tablet, TAKE 300 MG BY MOUTH DAILY AS NEEDED FOR GOUT, Disp: 90 tablet, Rfl: 2 .  ALPRAZolam (XANAX) 1 MG tablet, Take 1 tablet (1 mg total) by mouth at bedtime as needed for anxiety., Disp: 45 tablet, Rfl: 2 .  carvedilol (COREG) 25 MG tablet, Take 1 tablet (25 mg total) by mouth 2 (two) times daily., Disp: 60 tablet, Rfl: 11 .  colchicine (COLCRYS) 0.6 MG tablet, Take 1 tablet (0.6 mg total) by mouth daily as needed. Reported on 01/21/2016 (Patient taking differently: Take 0.6 mg by mouth daily as needed (GOUT). Reported on 01/21/2016), Disp: 45 tablet, Rfl: 1 .  doxycycline (VIBRAMYCIN) 100 MG capsule, Take 1 capsule (100 mg total) by mouth 2 (two) times daily., Disp: 20 capsule, Rfl: 0 .  FLUoxetine (PROZAC) 20 MG tablet, TAKE 1 TABLET(20 MG) BY MOUTH DAILY, Disp: 90 tablet, Rfl: 0 .  furosemide (LASIX) 40 MG tablet, Take 1.5 tablets (60 mg total) by mouth daily., Disp: 45 tablet, Rfl: 6 .  levothyroxine (SYNTHROID, LEVOTHROID) 100 MCG tablet, Take 1 tablet (100 mcg total) by mouth daily., Disp: 90 tablet, Rfl: 1 .  levothyroxine (SYNTHROID, LEVOTHROID) 50 MCG tablet, Take 1 tablet (50 mcg total) by mouth daily., Disp: 30 tablet, Rfl: 1 .  metoprolol succinate (TOPROL-XL) 50 MG 24 hr tablet, Take 1 tablet (50 mg total) by mouth 2 (two) times daily. Take with or immediately following a meal., Disp: 60 tablet, Rfl: 6 .  potassium chloride SA (K-DUR,KLOR-CON) 20 MEQ tablet, Take 1 tablet (20 mEq total) by mouth daily., Disp: 90 tablet, Rfl: 3 .  PRADAXA 150 MG CAPS capsule, TAKE ONE CAPSULE BY MOUTH TWICE DAILY, Disp: 60 capsule, Rfl: 5 .  rosuvastatin (CRESTOR) 20 MG tablet, Take 1 tablet (20 mg total) by mouth daily., Disp: 90 tablet, Rfl: 3 .  sacubitril-valsartan (ENTRESTO) 24-26 MG, Take 1 tablet by mouth 2 (two) times daily., Disp: 60 tablet, Rfl: 11 .  SYMBICORT 160-4.5 MCG/ACT inhaler, INHALE 2 PUFFS INTO THE LUNGS TWICE DAILY, Disp: 10.2 g, Rfl:  0 .  traZODone (DESYREL) 50 MG tablet, TAKE 1/2 TO 1 TABLET BY MOUTH EVERY NIGHT AT BEDTIME AS NEEDED FOR SLEEP, Disp: 90 tablet, Rfl: 0 .  VENTOLIN HFA 108 (90 Base) MCG/ACT inhaler, INHALE 2 PUFFS INTO THE LUNGS EVERY 6 HOURS AS NEEDED FOR WHEEZING OR SHORTNESS OF BREATH, Disp: 18 g, Rfl: 0  Allergies  Allergen Reactions  . Albuterol     Palpitations, intolerance  . Amiodarone     adverse reaction: Tremor/gait disurbance  . Atorvastatin     Body aches and pains--stiffness.   [name] [Pitavastatin]     Body aches  . Propoxyphene N-Acetaminophen Nausea And Vomiting    Review of Systems  All other systems reviewed and are negative.     Objective:   Physical Exam Vitals:   08/18/17 1411  BP: (!) 149/88  Pulse: 66   General AA&O x3. Normal mood and affect.  Vascular Dorsalis pedis and posterior tibial pulses  present 1+ and non-palpable bilaterally  Capillary refill normal to all digits. Pedal hair growth diminished.  Neurologic Epicritic sensation grossly present. Protective sensation absent  Dermatologic (Wound) Wound Location: Rt. foot navicular Wound Measurement: 0.3 x 0.5 x 0.1 Wound Base: Granular/Healthy Peri-wound: Calloused Exudate: Scant/small amount Serous exudate  Nails elongated and dystrophic  Orthopedic: MMT 5/5 in dorsiflexion, plantarflexion, inversion, and eversion. Normal lower extremity joint ROM without pain or crepitus.   Radiographs: Taken and reviewed. No osseous erosions present. No acute fractures or dislocations.     Assessment & Plan:  Evaluated and treated all questions answered.  Right foot ulceration -Debrided as below. -Padding dispensed. -Abx ointment and DSD applied.  Procedure: Selective Debridement of Wound Rationale: Removal of devitalized tissue from the wound to promote healing.  Pre-Debridement Wound Measurements: 0.3 cm x 0.5 cm x 0.1 cm  Post-Debridement Wound Measurements: same as pre-debridement. Type of Debridement:  Selective Tissue Removed: Devitalized soft-tissue Instrumentation: 3-0 mm dermal curette Dressing: Dry, sterile, compression dressing. Disposition: Patient tolerated procedure well. Patient to return in 1 week for follow-up.  DM with Onychomycosis, Coagulation Defect -Nails debrided as below.  Procedure: Nail Debridement Rationale: Patient meets criteria for routine foot care due to coagulation defect. Type of Debridement: manual, sharp debridement. Instrumentation: Nail nipper, rotary burr. Number of Nails: 10   Return in about 2 weeks (around 09/01/2017) for Wound Care.

## 2017-08-31 ENCOUNTER — Other Ambulatory Visit: Payer: Self-pay | Admitting: Medical

## 2017-09-01 ENCOUNTER — Encounter: Payer: Self-pay | Admitting: Podiatry

## 2017-09-01 ENCOUNTER — Ambulatory Visit (INDEPENDENT_AMBULATORY_CARE_PROVIDER_SITE_OTHER): Payer: Medicare FFS | Admitting: Podiatry

## 2017-09-01 DIAGNOSIS — L97309 Non-pressure chronic ulcer of unspecified ankle with unspecified severity: Secondary | ICD-10-CM | POA: Diagnosis not present

## 2017-09-01 DIAGNOSIS — E11622 Type 2 diabetes mellitus with other skin ulcer: Secondary | ICD-10-CM

## 2017-09-08 ENCOUNTER — Other Ambulatory Visit: Payer: Self-pay

## 2017-09-08 ENCOUNTER — Other Ambulatory Visit: Payer: Self-pay | Admitting: Medical

## 2017-09-08 ENCOUNTER — Telehealth: Payer: Self-pay | Admitting: Medical

## 2017-09-08 MED ORDER — ALLOPURINOL 300 MG PO TABS: ORAL_TABLET | ORAL | 2 refills | Status: DC

## 2017-09-08 MED ORDER — COLCHICINE 0.6 MG PO TABS: 0.6000 mg | ORAL_TABLET | Freq: Every day | ORAL | 0 refills | Status: DC | PRN

## 2017-09-08 MED ORDER — ALPRAZOLAM 1 MG PO TABS: 1.0000 mg | ORAL_TABLET | Freq: Every evening | ORAL | 0 refills | Status: DC | PRN

## 2017-09-08 NOTE — Telephone Encounter (Signed)
Patient called requesting refill on her meds for gout Also asking for refill on xanax, she states she took to pharmacy but was told rx had expired  [place]

## 2017-09-08 NOTE — Telephone Encounter (Signed)
done

## 2017-09-08 NOTE — Telephone Encounter (Signed)
Call out xanax without refill.  I sent the other

## 2017-09-12 NOTE — Progress Notes (Signed)
  Subjective:  Patient ID: Stephanie [name], female    DOB: 07/11/1951,  MRN: [id number]  Chief Complaint  Patient presents with  . Foot Ulcer    i am doing alot better on the right arch area   66 y.o. female returns for the above complaint.  States that the wound is doing a lot better  Objective:  There were no vitals filed for this visit. General AA&O x3. Normal mood and affect.  Vascular Pedal pulses palpable.  Neurologic Epicritic sensation grossly intact.  Dermatologic Wound pinpoint. No erythema or drainage. No signs of infection  Orthopedic: No pain to palpation either foot.   Assessment & Plan:  Patient was evaluated and treated and all questions answered.  Right foot navicular wound -Appears almost healed.  Continue antibiotic ointment and Band-Aid until full healing occurs. -Follow-up in 1 month for recheck  Return in about 1 month (around 10/01/2017).

## 2017-09-21 DIAGNOSIS — E039 Hypothyroidism, unspecified: Secondary | ICD-10-CM | POA: Diagnosis not present

## 2017-10-01 ENCOUNTER — Ambulatory Visit: Payer: Medicare Other | Admitting: Podiatry

## 2017-10-05 DIAGNOSIS — I4891 Unspecified atrial fibrillation: Secondary | ICD-10-CM | POA: Diagnosis not present

## 2017-10-05 DIAGNOSIS — E039 Hypothyroidism, unspecified: Secondary | ICD-10-CM | POA: Diagnosis not present

## 2017-10-30 ENCOUNTER — Other Ambulatory Visit: Payer: Self-pay | Admitting: Medical

## 2017-11-01 NOTE — Telephone Encounter (Signed)
Can pt have a refill on meds  

## 2017-11-01 NOTE — Telephone Encounter (Signed)
Make f/u appt for late January or early February.  Then e - send the medications

## 2017-11-19 ENCOUNTER — Other Ambulatory Visit: Payer: Self-pay | Admitting: Medical

## 2017-11-19 NOTE — Telephone Encounter (Signed)
Called and l/m for pt to call us back to set up an appt for med check

## 2017-11-25 ENCOUNTER — Other Ambulatory Visit: Payer: Self-pay

## 2017-11-25 ENCOUNTER — Ambulatory Visit (INDEPENDENT_AMBULATORY_CARE_PROVIDER_SITE_OTHER): Payer: Medicare Other | Admitting: Medical

## 2017-11-25 ENCOUNTER — Encounter: Payer: Self-pay | Admitting: Medical

## 2017-11-25 VITALS — BP 134/80 | HR 56 | Wt 266.4 lb

## 2017-11-25 DIAGNOSIS — E118 Type 2 diabetes mellitus with unspecified complications: Secondary | ICD-10-CM

## 2017-11-25 DIAGNOSIS — E785 Hyperlipidemia, unspecified: Secondary | ICD-10-CM | POA: Diagnosis not present

## 2017-11-25 DIAGNOSIS — E538 Deficiency of other specified B group vitamins: Secondary | ICD-10-CM

## 2017-11-25 DIAGNOSIS — I5032 Chronic diastolic (congestive) heart failure: Secondary | ICD-10-CM

## 2017-11-25 DIAGNOSIS — F411 Generalized anxiety disorder: Secondary | ICD-10-CM | POA: Diagnosis not present

## 2017-11-25 DIAGNOSIS — Z91199 Patient's noncompliance with other medical treatment and regimen due to unspecified reason: Secondary | ICD-10-CM

## 2017-11-25 DIAGNOSIS — I10 Essential (primary) hypertension: Secondary | ICD-10-CM | POA: Diagnosis not present

## 2017-11-25 DIAGNOSIS — M1A9XX0 Chronic gout, unspecified, without tophus (tophi): Secondary | ICD-10-CM

## 2017-11-25 DIAGNOSIS — M25561 Pain in right knee: Secondary | ICD-10-CM

## 2017-11-25 DIAGNOSIS — Z72821 Inadequate sleep hygiene: Secondary | ICD-10-CM

## 2017-11-25 DIAGNOSIS — G8929 Other chronic pain: Secondary | ICD-10-CM

## 2017-11-25 DIAGNOSIS — Z9119 Patient's noncompliance with other medical treatment and regimen: Secondary | ICD-10-CM

## 2017-11-25 DIAGNOSIS — R609 Edema, unspecified: Secondary | ICD-10-CM | POA: Diagnosis not present

## 2017-11-25 DIAGNOSIS — Z862 Personal history of diseases of the blood and blood-forming organs and certain disorders involving the immune mechanism: Secondary | ICD-10-CM

## 2017-11-25 DIAGNOSIS — I4819 Other persistent atrial fibrillation: Secondary | ICD-10-CM

## 2017-11-25 DIAGNOSIS — I481 Persistent atrial fibrillation: Secondary | ICD-10-CM | POA: Diagnosis not present

## 2017-11-25 DIAGNOSIS — R7989 Other specified abnormal findings of blood chemistry: Secondary | ICD-10-CM

## 2017-11-25 DIAGNOSIS — M25562 Pain in left knee: Secondary | ICD-10-CM

## 2017-11-25 DIAGNOSIS — G4700 Insomnia, unspecified: Secondary | ICD-10-CM

## 2017-11-25 MED ORDER — COLCHICINE 0.6 MG PO TABS: 0.6000 mg | ORAL_TABLET | Freq: Every day | ORAL | 1 refills | Status: DC | PRN

## 2017-11-25 MED ORDER — ALPRAZOLAM 1 MG PO TABS: 1.0000 mg | ORAL_TABLET | Freq: Every evening | ORAL | 1 refills | Status: DC | PRN

## 2017-11-25 NOTE — Assessment & Plan Note (Signed)
No edema on exam today but she is not taking her Lasix as recommended

## 2017-11-25 NOTE — Assessment & Plan Note (Signed)
Follow-up soon with cardiology advised

## 2017-11-25 NOTE — Assessment & Plan Note (Signed)
- 

## 2017-11-25 NOTE — Assessment & Plan Note (Signed)
We will request her most recent endocrinology records

## 2017-11-25 NOTE — Assessment & Plan Note (Signed)
Labs today.  She is not checking her sugars, noncompliant with monitoring and follow-up.  She is walking some for exercise.  Discussed daily foot checks, routine follow-up and monitoring for diabetes.  Discussed complications of diabetes

## 2017-11-25 NOTE — Assessment & Plan Note (Signed)
Chronic A. fib, compliant with medication, follow-up with cardiology soon

## 2017-11-25 NOTE — Assessment & Plan Note (Signed)
Discussed sleep hygiene measures including regular sleep schedule, optimal sleep environment, and relaxing presleep rituals.  Recommended daily exercise.  Discussed stimulus control, avoiding daytime naps, avoiding caffeine after noon, avoiding excess alcohol, avoiding food or drink <2 hours before bedtime.

## 2017-11-25 NOTE — Assessment & Plan Note (Signed)
We discussed her current concerns, sleep, and ways to deal with her stressors.  Refilled Xanax for as needed use

## 2017-11-25 NOTE — Assessment & Plan Note (Signed)
Continue allopurinol for prevention, Colcrys as needed for flareup

## 2017-11-25 NOTE — Progress Notes (Signed)
Subjective: Chief Complaint  Patient presents with  . med check    med check, pt needs refill on meds, pt was told by dr.karr that she doesn't thyroid    Here for med check.  Abnormal thyroid labs- she reports that she saw endocrinology recently about thyroid, was advised that her thyroid issue resolved.   Saw Dr. Buddy Duty.  No longer on medication for this   She still complains of sleep problems, does not sleep well in general, having some hot flashes lately.  Gout-taking Allopurinol 300mg  daily.  Is out of Colcrys.  Been having some knee pains a little worse than usual.  No recent fall or trauma    Been more anxious of late, picking at her skin more of late, ran out of Xanax.  Was on Fluoxetine in the past but hasn't taken in recent months.  No chest pain.  Gets out of breath sometimes.     Diabetes-Not checking glucose or BPs.  She did state that she is drinking sugary drinks regularly  Lives at home with son.    Edema- only taking Lasix 2-3 times per week.  Past Medical History:  Diagnosis Date  . Anemia   . Ankle fracture, right    x 2  . Antineoplastic and immunosuppressive drugs causing adverse effect in therapeutic use   . Asthma   . Atrial fibrillation ([place])   . Chronic venous hypertension without complications   . Colon polyps   . Colon polyps 2009  . Coronary artery disease    no CAD by cath 11.2010  . Depressive disorder, not elsewhere classified   . Diastolic CHF, chronic ([place])   . Diverticulosis of colon 2009   colonoscopy  . Dyslipidemia   . Glucose intolerance (impaired glucose tolerance)    hgb AIC 6.1 09/02/2009  . Gout   . Hepatitis, unspecified    hx of  . Hx of hysterectomy 2002  . Hyperlipidemia   . Hypertension   . Irritable bowel syndrome   . Lung nodule    65mm RLL nodule on CT Chest  07/2009, resolved by 2011 CT  . Noncompliance   . Obesity   . Persistent atrial fibrillation ([place])    s/p DCCV 07/2009; Pradaxa Rx  . PONV (postoperative  nausea and vomiting)   . Sarcoidosis    dx by eye exam  . Sleep disturbance 06/2013   sleep study, mild abnormality, no criteria for CPAP   Current Outpatient Medications on File Prior to Visit  Medication Sig Dispense Refill  . allopurinol (ZYLOPRIM) 300 MG tablet TAKE 300 MG BY MOUTH DAILY AS NEEDED FOR GOUT 90 tablet 2  . carvedilol (COREG) 25 MG tablet Take 1 tablet (25 mg total) by mouth 2 (two) times daily. 60 tablet 11  . furosemide (LASIX) 40 MG tablet Take 1.5 tablets (60 mg total) by mouth daily. 45 tablet 6  . metoprolol succinate (TOPROL-XL) 50 MG 24 hr tablet Take 1 tablet (50 mg total) by mouth 2 (two) times daily. Take with or immediately following a meal. 60 tablet 6  . potassium chloride SA (K-DUR,KLOR-CON) 20 MEQ tablet Take 1 tablet (20 mEq total) by mouth daily. 90 tablet 3  . PRADAXA 150 MG CAPS capsule TAKE ONE CAPSULE BY MOUTH TWICE DAILY 60 capsule 5  . rosuvastatin (CRESTOR) 20 MG tablet Take 1 tablet (20 mg total) by mouth daily. 90 tablet 3  . sacubitril-valsartan (ENTRESTO) 24-26 MG Take 1 tablet by mouth 2 (two) times daily. [place]  tablet 11  . SYMBICORT 160-4.5 MCG/ACT inhaler INHALE 2 PUFFS INTO THE LUNGS TWICE DAILY 10.2 g 0  . traZODone (DESYREL) 50 MG tablet TAKE 1/2 TO 1 TABLET BY MOUTH EVERY NIGHT AT BEDTIME AS NEEDED FOR SLEEP 90 tablet 0  . VENTOLIN HFA 108 (90 Base) MCG/ACT inhaler INHALE 2 PUFFS INTO THE LUNGS EVERY 6 HOURS AS NEEDED FOR WHEEZING OR SHORTNESS OF BREATH 18 g 0  . [DISCONTINUED] propranolol (INDERAL) 60 MG tablet Take 60 mg by mouth daily.       No current facility-administered medications on file prior to visit.    ROS as in subjective    Objective: BP 134/80   Pulse (!) 56   Wt 266 lb 6.4 oz (120.8 kg)   SpO2 96%   BMI 47.19 kg/m   General well-developed well-nourished no acute distress Skin unremarkable Lungs clear Heart irregularly irregular heart rate no murmur No obvious edema today Pulses within normal limits other than  irregular There is bony arthritic changes of the left patella, nontender to palpation of either knee no swelling, there is some laxity or malalignment with knee flexion and extension of both knees, but no pain with range of motion and no other positive test Psych-Pleasant, answers questions appropriately, good eye contact   Assessment: Encounter Diagnoses  Name Primary?  . Persistent atrial fibrillation ([place]) Yes  . Essential hypertension   . Dyslipidemia   . B12 deficiency   . Generalized anxiety disorder   . Chronic gout without tophus, unspecified cause, unspecified site   . Insomnia, unspecified type   . Noncompliance   . Poor sleep hygiene   . Edema, unspecified type   . Type 2 diabetes mellitus with complication, without long-term current use of insulin ([place])   . CHRONIC DIASTOLIC HEART FAILURE   . Abnormal thyroid blood test   . Chronic pain of both knees   . History of anemia      Plan: Type 2 diabetes mellitus with complication, without long-term current use of insulin ([place]) Labs today.  She is not checking her sugars, noncompliant with monitoring and follow-up.  She is walking some for exercise.  Discussed daily foot checks, routine follow-up and monitoring for diabetes.  Discussed complications of diabetes  CHRONIC DIASTOLIC HEART FAILURE Follow-up soon with cardiology advised  Dyslipidemia Routine labs today  Insomnia Discussed sleep hygiene measures including regular sleep schedule, optimal sleep environment, and relaxing presleep rituals.  Recommended daily exercise.  Discussed stimulus control, avoiding daytime naps, avoiding caffeine after noon, avoiding excess alcohol, avoiding food or drink <2 hours before bedtime.     Edema No edema on exam today but she is not taking her Lasix as recommended  Atrial fibrillation Chronic A. fib, compliant with medication, follow-up with cardiology soon  Gout Continue allopurinol for prevention, Colcrys as needed for  flareup  Abnormal thyroid blood test We will request her most recent endocrinology records  Generalized anxiety disorder We discussed her current concerns, sleep, and ways to deal with her stressors.  Refilled Xanax for as needed use  Stephanie [name] was seen today for med check.  Diagnoses and all orders for this visit:  Persistent atrial fibrillation ([place])  Essential hypertension -     Comprehensive metabolic panel -     CBC with Differential/Platelet  Dyslipidemia -     Lipid panel  B12 deficiency -     CBC with Differential/Platelet -     Vitamin B12  Generalized anxiety disorder  Chronic gout without  tophus, unspecified cause, unspecified site -     Comprehensive metabolic panel -     CBC with Differential/Platelet  Insomnia, unspecified type  Noncompliance  Poor sleep hygiene  Edema, unspecified type -     Comprehensive metabolic panel  Type 2 diabetes mellitus with complication, without long-term current use of insulin ([facility]) -     Hemoglobin A1c -     HM DIABETES EYE EXAM -     HM DIABETES FOOT EXAM -     Microalbumin / creatinine urine ratio  CHRONIC DIASTOLIC HEART FAILURE  Abnormal thyroid blood test  Chronic pain of both knees -     DG Knee Complete 4 Views Left; Future -     DG Knee Complete 4 Views Right; Future  History of anemia  Other orders -     ALPRAZolam (XANAX) 1 MG tablet; Take 1 tablet (1 mg total) by mouth at bedtime as needed for anxiety. -     colchicine (COLCRYS) 0.6 MG tablet; Take 1 tablet (0.6 mg total) by mouth daily as needed (GOUT). Reported on 01/21/2016

## 2017-11-26 ENCOUNTER — Other Ambulatory Visit: Payer: Self-pay | Admitting: Medical

## 2017-11-26 LAB — COMPREHENSIVE METABOLIC PANEL
A/G RATIO: 1.2 (ref 1.2–2.2)
ALT: 14 IU/L (ref 0–32)
AST: 19 IU/L (ref 0–40)
Albumin: 4.2 g/dL (ref 3.6–4.8)
Alkaline Phosphatase: 87 IU/L (ref 39–117)
BILIRUBIN TOTAL: 0.4 mg/dL (ref 0.0–1.2)
BUN/Creatinine Ratio: 23 (ref 12–28)
BUN: 30 mg/dL — ABNORMAL HIGH (ref 8–27)
CALCIUM: 9.9 mg/dL (ref 8.7–10.3)
CHLORIDE: 102 mmol/L (ref 96–106)
CO2: 24 mmol/L (ref 20–29)
Creatinine, Ser: 1.29 mg/dL — ABNORMAL HIGH (ref 0.57–1.00)
GFR calc Af Amer: 50 mL/min/{1.73_m2} — ABNORMAL LOW (ref >59)
GFR, EST NON AFRICAN AMERICAN: 44 mL/min/{1.73_m2} — AB (ref >59)
GLOBULIN, TOTAL: 3.4 g/dL (ref 1.5–4.5)
Glucose: 183 mg/dL — ABNORMAL HIGH (ref 65–99)
POTASSIUM: 4.6 mmol/L (ref 3.5–5.2)
SODIUM: 142 mmol/L (ref 134–144)
Total Protein: 7.6 g/dL (ref 6.0–8.5)

## 2017-11-26 LAB — LIPID PANEL
CHOLESTEROL TOTAL: 178 mg/dL (ref 100–199)
Chol/HDL Ratio: 2.8 ratio (ref 0.0–4.4)
HDL: 64 mg/dL (ref >39)
LDL Calculated: 92 mg/dL (ref 0–99)
Triglycerides: 108 mg/dL (ref 0–149)
VLDL Cholesterol Cal: 22 mg/dL (ref 5–40)

## 2017-11-26 LAB — CBC WITH DIFFERENTIAL/PLATELET
BASOS ABS: 0 10*3/uL (ref 0.0–0.2)
BASOS: 1 %
EOS (ABSOLUTE): 0.1 10*3/uL (ref 0.0–0.4)
Eos: 1 %
Hematocrit: 37.2 % (ref 34.0–46.6)
Hemoglobin: 12.2 g/dL (ref 11.1–15.9)
IMMATURE GRANS (ABS): 0 10*3/uL (ref 0.0–0.1)
Immature Granulocytes: 0 %
LYMPHS: 49 %
Lymphocytes Absolute: 3 10*3/uL (ref 0.7–3.1)
MCH: 30.6 pg (ref 26.6–33.0)
MCHC: 32.8 g/dL (ref 31.5–35.7)
MCV: 93 fL (ref 79–97)
MONOS ABS: 0.4 10*3/uL (ref 0.1–0.9)
Monocytes: 7 %
NEUTROS ABS: 2.6 10*3/uL (ref 1.4–7.0)
NEUTROS PCT: 42 %
PLATELETS: 202 10*3/uL (ref 150–379)
RBC: 3.99 x10E6/uL (ref 3.77–5.28)
RDW: 14.1 % (ref 12.3–15.4)
WBC: 6.2 10*3/uL (ref 3.4–10.8)

## 2017-11-26 LAB — MICROALBUMIN / CREATININE URINE RATIO
CREATININE, UR: 340.6 mg/dL
MICROALB/CREAT RATIO: 36.2 mg/g{creat} — AB (ref 0.0–30.0)
MICROALBUM., U, RANDOM: 123.3 ug/mL

## 2017-11-26 LAB — VITAMIN B12: VITAMIN B 12: 479 pg/mL (ref 232–1245)

## 2017-11-26 LAB — HEMOGLOBIN A1C
Est. average glucose Bld gHb Est-mCnc: 163 mg/dL
HEMOGLOBIN A1C: 7.3 % — AB (ref 4.8–5.6)

## 2017-11-26 MED ORDER — CARVEDILOL 25 MG PO TABS: 25.0000 mg | ORAL_TABLET | Freq: Two times a day (BID) | ORAL | 1 refills | Status: DC

## 2017-11-26 MED ORDER — SACUBITRIL-VALSARTAN 24-26 MG PO TABS: 1.0000 | ORAL_TABLET | Freq: Two times a day (BID) | ORAL | 0 refills | Status: DC

## 2017-11-26 MED ORDER — INSULIN GLARGINE-LIXISENATIDE 100-33 UNT-MCG/ML ~~LOC~~ SOPN: 15.0000 [IU] | PEN_INJECTOR | Freq: Every day | SUBCUTANEOUS | 2 refills | Status: DC

## 2017-11-26 MED ORDER — METOPROLOL SUCCINATE ER 50 MG PO TB24: 50.0000 mg | ORAL_TABLET | Freq: Two times a day (BID) | ORAL | 1 refills | Status: DC

## 2017-11-26 MED ORDER — ROSUVASTATIN CALCIUM 20 MG PO TABS: 20.0000 mg | ORAL_TABLET | Freq: Every day | ORAL | 3 refills | Status: DC

## 2017-11-26 MED ORDER — FUROSEMIDE 40 MG PO TABS: 60.0000 mg | ORAL_TABLET | Freq: Every day | ORAL | 6 refills | Status: DC

## 2017-11-26 MED ORDER — POTASSIUM CHLORIDE CRYS ER 20 MEQ PO TBCR: 20.0000 meq | EXTENDED_RELEASE_TABLET | Freq: Every day | ORAL | 3 refills | Status: DC

## 2017-11-26 MED ORDER — DABIGATRAN ETEXILATE MESYLATE 150 MG PO CAPS: 150.0000 mg | ORAL_CAPSULE | Freq: Two times a day (BID) | ORAL | 1 refills | Status: DC

## 2017-11-27 ENCOUNTER — Other Ambulatory Visit: Payer: Self-pay | Admitting: Medical

## 2017-12-01 ENCOUNTER — Ambulatory Visit
Admission: RE | Admit: 2017-12-01 | Discharge: 2017-12-01 | Disposition: A | Discharge status: Home / Self Care | Payer: Medicare Other | Source: Ambulatory Visit | Attending: Medical | Admitting: Medical

## 2017-12-01 DIAGNOSIS — M25562 Pain in left knee: Principal | ICD-10-CM

## 2017-12-01 DIAGNOSIS — M25561 Pain in right knee: Principal | ICD-10-CM

## 2017-12-01 DIAGNOSIS — M170 Bilateral primary osteoarthritis of knee: Secondary | ICD-10-CM | POA: Diagnosis not present

## 2017-12-01 DIAGNOSIS — G8929 Other chronic pain: Secondary | ICD-10-CM

## 2017-12-02 ENCOUNTER — Other Ambulatory Visit: Payer: Self-pay

## 2017-12-02 DIAGNOSIS — M25561 Pain in right knee: Secondary | ICD-10-CM

## 2017-12-02 DIAGNOSIS — M25562 Pain in left knee: Principal | ICD-10-CM

## 2017-12-09 ENCOUNTER — Ambulatory Visit: Payer: Medicare FFS | Admitting: Medical

## 2017-12-21 NOTE — Progress Notes (Signed)
[name] [address], [place] [address] Phone: [phone] Subjective:      CC: Bilateral knee pain  [id number]  Stephanie [name] is a 67 y.o. female coming in with complaint of bilateral knee pain. States she thinks her pain has to do with her pain.   Onset- Chronic Location- Knee cap Character- Achy Aggravating factors- Steps Therapies tried-mild icing Severity-7 out of 10   Had x-rays.  Degenerative arthritis.  Independently visualized by me.  Advanced near bone-on-bone    Past Medical History:  Diagnosis Date  . Anemia   . Ankle fracture, right    x 2  . Antineoplastic and immunosuppressive drugs causing adverse effect in therapeutic use   . Asthma   . Atrial fibrillation ([place])   . Chronic venous hypertension without complications   . Colon polyps   . Colon polyps 2009  . Coronary artery disease    no CAD by cath 11.2010  . Depressive disorder, not elsewhere classified   . Diastolic CHF, chronic ([place])   . Diverticulosis of colon 2009   colonoscopy  . Dyslipidemia   . Glucose intolerance (impaired glucose tolerance)    hgb AIC 6.1 09/02/2009  . Gout   . Hepatitis, unspecified    hx of  . Hx of hysterectomy 2002  . Hyperlipidemia   . Hypertension   . Irritable bowel syndrome   . Lung nodule    44mm RLL nodule on CT Chest  07/2009, resolved by 2011 CT  . Noncompliance   . Obesity   . Persistent atrial fibrillation ([place])    s/p DCCV 07/2009; Pradaxa Rx  . PONV (postoperative nausea and vomiting)   . Sarcoidosis    dx by eye exam  . Sleep disturbance 06/2013   sleep study, mild abnormality, no criteria for CPAP   Past Surgical History:  Procedure Laterality Date  . ABDOMINAL HYSTERECTOMY    . CARDIOVERSION     x 2  . CARDIOVERSION N/A 02/27/2013   Procedure: CARDIOVERSION;  Surgeon: Lelon Perla, MD;  [facility] ENDOSCOPY;  Service: Cardiovascular;  Laterality: N/A;  . CARDIOVERSION N/A 03/24/2017   Procedure: CARDIOVERSION;  Surgeon: Josue Hector, MD;  Location: [facility] ENDOSCOPY;  Service: Cardiovascular;  Laterality: N/A;  . CHOLECYSTECTOMY    . COLONOSCOPY     Diverticulosis, colon polyps, repeat 2014, Dr. Deatra Ina  . ESOPHAGOGASTRODUODENOSCOPY N/A 05/16/2014   Procedure: ESOPHAGOGASTRODUODENOSCOPY (EGD);  Surgeon: Wonda Horner, MD;  Location: [facility] ORS;  Service: Gastroenterology;  Laterality: N/A;  . HERNIA REPAIR  2009   Social History   Socioeconomic History  . Marital status: Single    Spouse name: None  . Number of children: 5  . Years of education: college  . Highest education level: None  Social Needs  . Financial resource strain: None  . Food insecurity - worry: None  . Food insecurity - inability: None  . Transportation needs - medical: None  . Transportation needs - non-medical: None  Occupational History  . Occupation: Disabled.  Tobacco Use  . Smoking status: Never Smoker  . Smokeless tobacco: Never Used  Substance and Sexual Activity  . Alcohol use: No    Alcohol/week: 0.0 oz  . Drug use: No  . Sexual activity: Not Currently    Birth control/protection: Surgical  Other Topics Concern  . None  Social History Narrative   Pt lives in [place] with her son.  Unemployed.  Right handed.     Education  college   Allergies  Allergen Reactions  . Albuterol     Palpitations, intolerance  . Amiodarone     adverse reaction: Tremor/gait disurbance  . Atorvastatin     Body aches and pains--stiffness.   [name] [Pitavastatin]     Body aches  . Propoxyphene N-Acetaminophen Nausea And Vomiting   Family History  Problem Relation Age of Onset  . Pneumonia Father        deceased  . Hypertension Father   . Cancer Father   . Multiple sclerosis Mother        hx  . Emphysema Other        runs in the family     Past medical history, social, surgical and family history all reviewed in electronic medical record.  No pertanent information unless stated regarding  to the chief complaint.   Review of Systems:Review of systems updated and as accurate as of 12/22/17  No headache, visual changes, nausea, vomiting, diarrhea, constipation, dizziness, abdominal pain, skin rash, fevers, chills, night sweats, weight loss, swollen lymph nodes, body aches, joint swelling,  chest pain, shortness of breath, mood changes.  Positive muscle aches  Objective  Blood pressure 138/80, pulse 75, height 5\' 3"  (1.6 m), weight 274 lb (124.3 kg), SpO2 98 %. Systems examined below as of 12/22/17   General: No apparent distress alert and oriented x3 mood and affect normal, dressed appropriately.  HEENT: Pupils equal, extraocular movements intact  Respiratory: Patient's speak in full sentences and does not appear short of breath  Cardiovascular: No lower extremity edema, non tender, no erythema  Skin: Warm dry intact with no signs of infection or rash on extremities or on axial skeleton.  Abdomen: Soft nontender  Neuro: Cranial nerves II through XII are intact, neurovascularly intact in all extremities with 2+ DTRs and 2+ pulses.  Lymph: No lymphadenopathy of posterior or anterior cervical chain or axillae bilaterally.  Gait antalgic gait MSK:  Non tender with full range of motion and good stability and symmetric strength and tone of shoulders, elbows, wrist, hip and ankles bilaterally.   Knee: Bilateral valgus deformity noted. Large thigh to calf ratio.  Tender to palpation over medial and PF joint line.  ROM full in flexion and extension and lower leg rotation. instability with valgus force.  painful patellar compression. Patellar glide with moderate crepitus. Patellar and quadriceps tendons unremarkable. Hamstring and quadriceps strength is normal.      Impression and Recommendations:     This case required medical decision making of moderate complexity.      Note: This dictation was prepared with Dragon dictation along with smaller phrase technology. Any  transcriptional errors that result from this process are unintentional.

## 2017-12-22 ENCOUNTER — Ambulatory Visit (INDEPENDENT_AMBULATORY_CARE_PROVIDER_SITE_OTHER): Payer: Medicare Other | Admitting: Family Medicine

## 2017-12-22 ENCOUNTER — Encounter: Payer: Self-pay | Admitting: Family Medicine

## 2017-12-22 DIAGNOSIS — M170 Bilateral primary osteoarthritis of knee: Secondary | ICD-10-CM | POA: Diagnosis not present

## 2017-12-22 NOTE — Assessment & Plan Note (Signed)
Patient has significant amount of arthritis bilaterally.  Discussed multiple different treatment and patient's prognosis.  We did need a replacement.  Patient wants to try all conservative therapy.  We discussed icing regimen, home exercises, topical anti-inflammatories, which activities to doing which wants to avoid.  We discussed the possibility of steroid injections which patient declined today.  Patient also declined formal physical therapy.  Given home exercises to work with Product/process development scientist.  Patient is going to try to make these different changes and come back and see me again in 4 weeks.

## 2017-12-22 NOTE — Patient Instructions (Signed)
Good to see you.  Ice 20 minutes 2 times daily. Usually after activity and before bed. Exercises 3 times a week.  pennsaid pinkie amount topically 2 times daily as needed.  Biking elliptical would be great  I think you will do well with the weight loss.  Over the counter get  Vitamin D 2000 IU daily  Turmeric 500mg  daily  Tart cherry extract any dose at night (will also help with gout)  See me again in 4-6 weeks

## 2017-12-23 ENCOUNTER — Ambulatory Visit: Payer: Medicare Other | Admitting: Family Medicine

## 2017-12-25 ENCOUNTER — Encounter: Payer: Self-pay | Admitting: Gastroenterology

## 2018-01-05 DIAGNOSIS — E039 Hypothyroidism, unspecified: Secondary | ICD-10-CM | POA: Diagnosis not present

## 2018-01-24 NOTE — Progress Notes (Signed)
[name] [address] [place], [place] [address] Phone: [phone] Subjective:    I'm seeing this patient by the request  of:    CC: Knee pain  [id number]  Stephanie [name] is a 67 y.o. female coming in with complaint of bilateral knee pain.  Found to have severe bone-on-bone osteoarthritic changes.  Starting to have worsening symptoms overall.  Having difficulty with daily activities.  Patient states some mild swelling associated with it.      Past Medical History:  Diagnosis Date  . Anemia   . Ankle fracture, right    x 2  . Antineoplastic and immunosuppressive drugs causing adverse effect in therapeutic use   . Asthma   . Atrial fibrillation ([place])   . Chronic venous hypertension without complications   . Colon polyps   . Colon polyps 2009  . Coronary artery disease    no CAD by cath 11.2010  . Depressive disorder, not elsewhere classified   . Diastolic CHF, chronic ([place])   . Diverticulosis of colon 2009   colonoscopy  . Dyslipidemia   . Glucose intolerance (impaired glucose tolerance)    hgb AIC 6.1 09/02/2009  . Gout   . Hepatitis, unspecified    hx of  . Hx of hysterectomy 2002  . Hyperlipidemia   . Hypertension   . Irritable bowel syndrome   . Lung nodule    66mm RLL nodule on CT Chest  07/2009, resolved by 2011 CT  . Noncompliance   . Obesity   . Persistent atrial fibrillation ([place])    s/p DCCV 07/2009; Pradaxa Rx  . PONV (postoperative nausea and vomiting)   . Sarcoidosis    dx by eye exam  . Sleep disturbance 06/2013   sleep study, mild abnormality, no criteria for CPAP   Past Surgical History:  Procedure Laterality Date  . ABDOMINAL HYSTERECTOMY    . CARDIOVERSION     x 2  . CARDIOVERSION N/A 02/27/2013   Procedure: CARDIOVERSION;  Surgeon: Lelon Perla, MD;  Location: [facility] ENDOSCOPY;  Service: Cardiovascular;  Laterality: N/A;  . CARDIOVERSION N/A 03/24/2017   Procedure: CARDIOVERSION;  Surgeon: Josue Hector, MD;  Location: [facility] ENDOSCOPY;  Service: Cardiovascular;  Laterality: N/A;  . CHOLECYSTECTOMY    . COLONOSCOPY     Diverticulosis, colon polyps, repeat 2014, Dr. Deatra Ina  . ESOPHAGOGASTRODUODENOSCOPY N/A 05/16/2014   Procedure: ESOPHAGOGASTRODUODENOSCOPY (EGD);  Surgeon: Wonda Horner, MD;  Location: [facility] ORS;  Service: Gastroenterology;  Laterality: N/A;  . HERNIA REPAIR  2009   Social History   Socioeconomic History  . Marital status: Single    Spouse name: Not on file  . Number of children: 5  . Years of education: college  . Highest education level: Not on file  Occupational History  . Occupation: Disabled.  Social Needs  . Financial resource strain: Not on file  . Food insecurity:    Worry: Not on file    Inability: Not on file  . Transportation needs:    Medical: Not on file    Non-medical: Not on file  Tobacco Use  . Smoking status: Never Smoker  . Smokeless tobacco: Never Used  Substance and Sexual Activity  . Alcohol use: No    Alcohol/week: 0.0 oz  . Drug use: No  . Sexual activity: Not Currently    Birth control/protection: Surgical  Lifestyle  . Physical activity:    Days per week: Not on file    Minutes per  session: Not on file  . Stress: Not on file  Relationships  . Social connections:    Talks on phone: Not on file    Gets together: Not on file    Attends religious service: Not on file    Active member of club or organization: Not on file    Attends meetings of clubs or organizations: Not on file    Relationship status: Not on file  Other Topics Concern  . Not on file  Social History Narrative   Pt lives in [place] with her son.  Unemployed.  Right handed.     Education college   Allergies  Allergen Reactions  . Albuterol     Palpitations, intolerance  . Amiodarone     adverse reaction: Tremor/gait disurbance  . Atorvastatin     Body aches and pains--stiffness.   [name] [Pitavastatin]     Body aches  . Propoxyphene N-Acetaminophen  Nausea And Vomiting   Family History  Problem Relation Age of Onset  . Pneumonia Father        deceased  . Hypertension Father   . Cancer Father   . Multiple sclerosis Mother        hx  . Emphysema Other        runs in the family     Past medical history, social, surgical and family history all reviewed in electronic medical record.  No pertanent information unless stated regarding to the chief complaint.   Review of Systems:Review of systems updated and as  No headache, visual changes, nausea, vomiting, diarrhea, constipation, dizziness, abdominal pain, skin rash, fevers, chills, night sweats, weight loss, swollen lymph nodes, body aches, , chest pain, shortness of breath, mood changes.  Positive muscle aches and joint swelling  Objective  Blood pressure 130/80, pulse 78, height 5\' 3"  (1.6 m), weight 271 lb (122.9 kg), SpO2 98 %.   General: No apparent distress alert and oriented x3 mood and affect normal, dressed appropriately.  HEENT: Pupils equal, extraocular movements intact  Respiratory: Patient's speak in full sentences and does not appear short of breath  Cardiovascular: No lower extremity edema, non tender, no erythema  Skin: Warm dry intact with no signs of infection or rash on extremities or on axial skeleton.  Abdomen: Soft nontender  Neuro: Cranial nerves II through XII are intact, neurovascularly intact in all extremities with 2+ DTRs and 2+ pulses.  Lymph: No lymphadenopathy of posterior or anterior cervical chain or axillae bilaterally.  Gait antalgic MSK: Mild tender with full range of motion and good stability and symmetric strength and tone of shoulders, elbows, wrist, hip, and ankles bilaterally.   Knee: Bilateral valgus deformity noted. Large thigh to calf ratio.  Tender to palpation over medial and PF joint line.  ROM full in flexion and extension and lower leg rotation. instability with valgus force.  painful patellar compression. Patellar glide with  moderate crepitus. Patellar and quadriceps tendons unremarkable. Hamstring and quadriceps strength is normal.     Impression and Recommendations:     This case required medical decision making of moderate complexity.      Note: This dictation was prepared with Dragon dictation along with smaller phrase technology. Any transcriptional errors that result from this process are unintentional.

## 2018-01-25 ENCOUNTER — Encounter: Payer: Self-pay | Admitting: Family Medicine

## 2018-01-25 ENCOUNTER — Ambulatory Visit (INDEPENDENT_AMBULATORY_CARE_PROVIDER_SITE_OTHER): Payer: Medicare Other | Admitting: Family Medicine

## 2018-01-25 DIAGNOSIS — M170 Bilateral primary osteoarthritis of knee: Secondary | ICD-10-CM | POA: Diagnosis not present

## 2018-01-25 MED ORDER — GABAPENTIN 100 MG PO CAPS: 200.0000 mg | ORAL_CAPSULE | Freq: Every day | ORAL | 3 refills | Status: DC

## 2018-01-25 NOTE — Patient Instructions (Signed)
Good to see you  pennsaid pinkie amount topically 2 times daily as needed.   If in a lot of pain try the duexis 1 pill 3 times daily for 3 days but no other ibuprofen  Continue everything else you are doing.  Better shoes could help  See me again in 4-6 weeks and we will consider injections if needed before your trip

## 2018-01-26 NOTE — Assessment & Plan Note (Signed)
Spent  25 minutes with patient face-to-face and had greater than 50% of counseling including as described in assessment and plan. Continues to have significant amount of pain and swelling.  Patient does not want any injections at this time.  Discussed the possibility of formal physical therapy which patient also declined.  Patient states that she will be more diligent had weight loss, lower impact exercises and home exercises.  Trial of topical anti-inflammatories given again.  Follow-up again in 3-4 weeks and if continuing to have pain consider injections before her trip with a lot of walking

## 2018-01-30 ENCOUNTER — Other Ambulatory Visit: Payer: Self-pay | Admitting: Medical

## 2018-01-31 ENCOUNTER — Other Ambulatory Visit: Payer: Self-pay | Admitting: *Deleted

## 2018-01-31 MED ORDER — DABIGATRAN ETEXILATE MESYLATE 150 MG PO CAPS: 150.0000 mg | ORAL_CAPSULE | Freq: Two times a day (BID) | ORAL | 5 refills | Status: DC

## 2018-01-31 NOTE — Telephone Encounter (Signed)
Okay to refill?  Last seen 11/2017

## 2018-01-31 NOTE — Telephone Encounter (Signed)
See below

## 2018-01-31 NOTE — Telephone Encounter (Signed)
Age 67 years Wt 122.9 01/25/2018 Saw Dr [name] 05/05/2017 11/25/2017 Hgb 12.2 Hct 37.2  SrCr 1.29 CrCl 84.35  Refill done for Pradaxa 150 mg q 12 hours as requested

## 2018-02-05 ENCOUNTER — Other Ambulatory Visit: Payer: Self-pay | Admitting: Medical

## 2018-02-07 NOTE — Telephone Encounter (Signed)
Is this ok to refill?  

## 2018-02-23 ENCOUNTER — Other Ambulatory Visit: Payer: Self-pay | Admitting: Medical

## 2018-02-28 NOTE — Progress Notes (Signed)
[name] [place], [place] [address] Phone: [phone] Subjective:    I'm seeing this patient by the request  of:    CC: Bilateral knee pain follow-up  ASN:[id number]  Stephanie [name] is a 67 y.o. female coming in with complaint of bilateral knee pain.  Severe bone-on-bone arthritis in the knees.  States that her knees seem to be doing well but she is more concerned with the swelling of her lower extremities.  Patient does have significant anemia and has had heart conditions but has not been following up with her cardiologist.  Patient feels like it is not the knees that is limiting her but more the swelling of the lower extremities.  Maybe some mild shortness of breath with activity but denies any true chest pain.     Past Medical History:  Diagnosis Date  . Anemia   . Ankle fracture, right    x 2  . Antineoplastic and immunosuppressive drugs causing adverse effect in therapeutic use   . Asthma   . Atrial fibrillation ([place])   . Chronic venous hypertension without complications   . Colon polyps   . Colon polyps 2009  . Coronary artery disease    no CAD by cath 11.2010  . Depressive disorder, not elsewhere classified   . Diastolic CHF, chronic ([place])   . Diverticulosis of colon 2009   colonoscopy  . Dyslipidemia   . Glucose intolerance (impaired glucose tolerance)    hgb AIC 6.1 09/02/2009  . Gout   . Hepatitis, unspecified    hx of  . Hx of hysterectomy 2002  . Hyperlipidemia   . Hypertension   . Irritable bowel syndrome   . Lung nodule    3mm RLL nodule on CT Chest  07/2009, resolved by 2011 CT  . Noncompliance   . Obesity   . Persistent atrial fibrillation ([place])    s/p DCCV 07/2009; Pradaxa Rx  . PONV (postoperative nausea and vomiting)   . Sarcoidosis    dx by eye exam  . Sleep disturbance 06/2013   sleep study, mild abnormality, no criteria for CPAP   Past Surgical History:  Procedure Laterality Date  . ABDOMINAL  HYSTERECTOMY    . CARDIOVERSION     x 2  . CARDIOVERSION N/A 02/27/2013   Procedure: CARDIOVERSION;  Surgeon: Lelon Perla, MD;  Location: [facility] ENDOSCOPY;  Service: Cardiovascular;  Laterality: N/A;  . CARDIOVERSION N/A 03/24/2017   Procedure: CARDIOVERSION;  Surgeon: Josue Hector, MD;  Location: [facility] ENDOSCOPY;  Service: Cardiovascular;  Laterality: N/A;  . CHOLECYSTECTOMY    . COLONOSCOPY     Diverticulosis, colon polyps, repeat 2014, Dr. Deatra Ina  . ESOPHAGOGASTRODUODENOSCOPY N/A 05/16/2014   Procedure: ESOPHAGOGASTRODUODENOSCOPY (EGD);  Surgeon: Wonda Horner, MD;  Location: [facility] ORS;  Service: Gastroenterology;  Laterality: N/A;  . HERNIA REPAIR  2009   Social History   Socioeconomic History  . Marital status: Single    Spouse name: Not on file  . Number of children: 5  . Years of education: college  . Highest education level: Not on file  Occupational History  . Occupation: Disabled.  Social Needs  . Financial resource strain: Not on file  . Food insecurity:    Worry: Not on file    Inability: Not on file  . Transportation needs:    Medical: Not on file    Non-medical: Not on file  Tobacco Use  . Smoking status: Never Smoker  . Smokeless  tobacco: Never Used  Substance and Sexual Activity  . Alcohol use: No    Alcohol/week: 0.0 oz  . Drug use: No  . Sexual activity: Not Currently    Birth control/protection: Surgical  Lifestyle  . Physical activity:    Days per week: Not on file    Minutes per session: Not on file  . Stress: Not on file  Relationships  . Social connections:    Talks on phone: Not on file    Gets together: Not on file    Attends religious service: Not on file    Active member of club or organization: Not on file    Attends meetings of clubs or organizations: Not on file    Relationship status: Not on file  Other Topics Concern  . Not on file  Social History Narrative   Pt lives in [place] with her son.  Unemployed.  Right handed.      Education college   Allergies  Allergen Reactions  . Albuterol     Palpitations, intolerance  . Amiodarone     adverse reaction: Tremor/gait disurbance  . Atorvastatin     Body aches and pains--stiffness.   [name] [Pitavastatin]     Body aches  . Propoxyphene N-Acetaminophen Nausea And Vomiting   Family History  Problem Relation Age of Onset  . Pneumonia Father        deceased  . Hypertension Father   . Cancer Father   . Multiple sclerosis Mother        hx  . Emphysema Other        runs in the family     Past medical history, social, surgical and family history all reviewed in electronic medical record.  No pertanent information unless stated regarding to the chief complaint.   Review of Systems:Review of systems updated and as accurate as of 03/01/18  No headache, visual changes, nausea, vomiting, diarrhea, constipation, dizziness, abdominal pain, skin rash, fevers, chills, night sweats, weight loss, swollen lymph nodes,chest pain, shortness of breath, mood changes.  Positive muscle aches, joint swelling, body aches  Objective  Blood pressure (!) 146/90, pulse 60, height 5\' 3"  (1.6 m), weight 276 lb (125.2 kg), SpO2 98 %. Systems examined below as of 03/01/18   General: No apparent distress alert and oriented x3 mood and affect normal, dressed appropriately.  Morbidly obese HEENT: Pupils equal, extraocular movements intact  Respiratory: Patient's speak in full sentences and does not appear short of breath  Cardiovascular: 1+ lower extremity edema, tender, no erythema patient does have changes that is somewhat consistent with erythema nodosum Skin: Warm dry intact with no signs of infection or rash on extremities or on axial skeleton.   Patient though does have some exfoliation on the hands. Abdomen: Soft nontender  Neuro: Cranial nerves II through XII are intact, neurovascularly intact in all extremities with 2+ DTRs and 2+ pulses.  Lymph: No lymphadenopathy of posterior  or anterior cervical chain or axillae bilaterally.  Gait antalgic MSK:  tender with near full range of motion and good stability and symmetric strength and tone of shoulders, elbows, wrist, hip, and ankles bilaterally.  Knee: Bilateral valgus deformity noted. Large thigh to calf ratio.  Tender to palpation over medial and PF joint line.  ROM full in flexion and extension and lower leg rotation. instability with valgus force.  painful patellar compression. Patellar glide with moderate crepitus. Patellar and quadriceps tendons unremarkable. Hamstring and quadriceps strength is normal.     Impression  and Recommendations:     This case required medical decision making of moderate complexity.      Note: This dictation was prepared with Dragon dictation along with smaller phrase technology. Any transcriptional errors that result from this process are unintentional.

## 2018-03-01 ENCOUNTER — Other Ambulatory Visit (INDEPENDENT_AMBULATORY_CARE_PROVIDER_SITE_OTHER): Payer: Medicare Other

## 2018-03-01 ENCOUNTER — Encounter: Payer: Self-pay | Admitting: Family Medicine

## 2018-03-01 ENCOUNTER — Ambulatory Visit (INDEPENDENT_AMBULATORY_CARE_PROVIDER_SITE_OTHER): Payer: Medicare Other | Admitting: Family Medicine

## 2018-03-01 VITALS — BP 146/90 | HR 60 | Ht 63.0 in | Wt 276.0 lb

## 2018-03-01 DIAGNOSIS — I509 Heart failure, unspecified: Secondary | ICD-10-CM | POA: Diagnosis not present

## 2018-03-01 DIAGNOSIS — I10 Essential (primary) hypertension: Secondary | ICD-10-CM

## 2018-03-01 DIAGNOSIS — R21 Rash and other nonspecific skin eruption: Secondary | ICD-10-CM

## 2018-03-01 DIAGNOSIS — Z114 Encounter for screening for human immunodeficiency virus [HIV]: Secondary | ICD-10-CM | POA: Diagnosis not present

## 2018-03-01 DIAGNOSIS — D509 Iron deficiency anemia, unspecified: Secondary | ICD-10-CM

## 2018-03-01 DIAGNOSIS — I4819 Other persistent atrial fibrillation: Secondary | ICD-10-CM

## 2018-03-01 DIAGNOSIS — E870 Hyperosmolality and hypernatremia: Secondary | ICD-10-CM

## 2018-03-01 DIAGNOSIS — E559 Vitamin D deficiency, unspecified: Secondary | ICD-10-CM

## 2018-03-01 DIAGNOSIS — E871 Hypo-osmolality and hyponatremia: Secondary | ICD-10-CM

## 2018-03-01 DIAGNOSIS — Z91199 Patient's noncompliance with other medical treatment and regimen due to unspecified reason: Secondary | ICD-10-CM

## 2018-03-01 DIAGNOSIS — I481 Persistent atrial fibrillation: Secondary | ICD-10-CM

## 2018-03-01 DIAGNOSIS — M2550 Pain in unspecified joint: Secondary | ICD-10-CM

## 2018-03-01 DIAGNOSIS — Z9119 Patient's noncompliance with other medical treatment and regimen: Secondary | ICD-10-CM

## 2018-03-01 DIAGNOSIS — M791 Myalgia, unspecified site: Secondary | ICD-10-CM

## 2018-03-01 LAB — COMPREHENSIVE METABOLIC PANEL
ALT: 15 U/L (ref 0–35)
AST: 13 U/L (ref 0–37)
Albumin: 3.9 g/dL (ref 3.5–5.2)
Alkaline Phosphatase: 58 U/L (ref 39–117)
BUN: 32 mg/dL — AB (ref 6–23)
CALCIUM: 9.3 mg/dL (ref 8.4–10.5)
CHLORIDE: 108 meq/L (ref 96–112)
CO2: 26 mEq/L (ref 19–32)
CREATININE: 1.25 mg/dL — AB (ref 0.40–1.20)
GFR: 55.19 mL/min — ABNORMAL LOW (ref >60.00)
Glucose, Bld: 138 mg/dL — ABNORMAL HIGH (ref 70–99)
POTASSIUM: 4 meq/L (ref 3.5–5.1)
SODIUM: 142 meq/L (ref 135–145)
TOTAL PROTEIN: 7.4 g/dL (ref 6.0–8.3)
Total Bilirubin: 0.4 mg/dL (ref 0.2–1.2)

## 2018-03-01 LAB — SEDIMENTATION RATE: Sed Rate: 44 mm/hr — ABNORMAL HIGH (ref 0–30)

## 2018-03-01 LAB — CBC WITH DIFFERENTIAL/PLATELET
BASOS PCT: 0.4 % (ref 0.0–3.0)
Basophils Absolute: 0 10*3/uL (ref 0.0–0.1)
EOS PCT: 1.5 % (ref 0.0–5.0)
Eosinophils Absolute: 0.1 10*3/uL (ref 0.0–0.7)
HEMATOCRIT: 34.9 % — AB (ref 36.0–46.0)
HEMOGLOBIN: 11.6 g/dL — AB (ref 12.0–15.0)
Lymphocytes Relative: 38.4 % (ref 12.0–46.0)
Lymphs Abs: 2.2 10*3/uL (ref 0.7–4.0)
MCHC: 33.1 g/dL (ref 30.0–36.0)
MCV: 95.6 fl (ref 78.0–100.0)
Monocytes Absolute: 0.4 10*3/uL (ref 0.1–1.0)
Monocytes Relative: 6.6 % (ref 3.0–12.0)
Neutro Abs: 3.1 10*3/uL (ref 1.4–7.7)
Neutrophils Relative %: 53.1 % (ref 43.0–77.0)
Platelets: 172 10*3/uL (ref 150.0–400.0)
RBC: 3.65 Mil/uL — AB (ref 3.87–5.11)
RDW: 14 % (ref 11.5–15.5)
WBC: 5.8 10*3/uL (ref 4.0–10.5)

## 2018-03-01 LAB — BRAIN NATRIURETIC PEPTIDE: Pro B Natriuretic peptide (BNP): 715 pg/mL — ABNORMAL HIGH (ref 0.0–100.0)

## 2018-03-01 LAB — URIC ACID: URIC ACID, SERUM: 7.8 mg/dL — AB (ref 2.4–7.0)

## 2018-03-01 LAB — TSH: TSH: 6.97 u[IU]/mL — ABNORMAL HIGH (ref 0.35–4.50)

## 2018-03-01 LAB — C-REACTIVE PROTEIN: CRP: 0.4 mg/dL — ABNORMAL LOW (ref 0.5–20.0)

## 2018-03-01 LAB — IBC PANEL
IRON: 65 ug/dL (ref 42–145)
SATURATION RATIOS: 18.1 % — AB (ref 20.0–50.0)
TRANSFERRIN: 256 mg/dL (ref 212.0–360.0)

## 2018-03-01 NOTE — Assessment & Plan Note (Signed)
Significant noncompliance as well over the course of time.  Patient is having difficulty with multiple health issues and I think the patient's medical literacy is quite low.  Discussed with patient at great length.  We discussed the possibility of getting other possible help with her understanding which seemed to make patient more standoffish.  Encouraged her that she does need to follow-up with her other providers and if any worsening symptoms need to seek medical attention immediately.

## 2018-03-01 NOTE — Assessment & Plan Note (Signed)
History of atrial fibrillation.  Will check BMP today and of the labs to make sure that this is not contributing to the patient's lower extremity edema.

## 2018-03-01 NOTE — Patient Instructions (Signed)
Good to see you  I am glad the knees are doing well  We will get labs to check on the swelling.  Talk to your doc about seeing a heart doctor to get the medications fixed.  Lasix 2 pills daily for 3 days then back to 1 pill daily and also talk to your doctor.  If the knees are doing well then see me when you need me

## 2018-03-01 NOTE — Assessment & Plan Note (Signed)
Discussed with patient in great length.  Patient has been doing relatively well but has been very noncompliant with her medications.  When asked if she is taking anything she states that from time to time she takes certain medications but feels like she is having difficulty doing which wants to take.  Patient has not been seen a cardiologist at this time on a regular basis but is set up to see primary care provider next week.

## 2018-03-02 ENCOUNTER — Observation Stay (HOSPITAL_COMMUNITY)
Admission: EM | Admit: 2018-03-02 | Discharge: 2018-03-04 | Disposition: A | Discharge status: Home / Self Care | Payer: Medicare Other | Attending: Internal Medicine | Admitting: Internal Medicine

## 2018-03-02 ENCOUNTER — Other Ambulatory Visit: Payer: Self-pay

## 2018-03-02 ENCOUNTER — Encounter (HOSPITAL_COMMUNITY): Payer: Self-pay | Admitting: Emergency Medicine

## 2018-03-02 ENCOUNTER — Emergency Department (HOSPITAL_COMMUNITY): Payer: Medicare Other

## 2018-03-02 ENCOUNTER — Ambulatory Visit (INDEPENDENT_AMBULATORY_CARE_PROVIDER_SITE_OTHER): Payer: Medicare Other | Admitting: Medical

## 2018-03-02 ENCOUNTER — Other Ambulatory Visit: Payer: Self-pay | Admitting: Family Medicine

## 2018-03-02 ENCOUNTER — Encounter: Payer: Self-pay | Admitting: Medical

## 2018-03-02 VITALS — BP 140/88 | HR 57 | Temp 98.1°F | Ht 62.5 in | Wt 274.4 lb

## 2018-03-02 DIAGNOSIS — Z91199 Patient's noncompliance with other medical treatment and regimen due to unspecified reason: Secondary | ICD-10-CM

## 2018-03-02 DIAGNOSIS — I5032 Chronic diastolic (congestive) heart failure: Secondary | ICD-10-CM | POA: Diagnosis not present

## 2018-03-02 DIAGNOSIS — Z9119 Patient's noncompliance with other medical treatment and regimen: Secondary | ICD-10-CM | POA: Diagnosis not present

## 2018-03-02 DIAGNOSIS — J45909 Unspecified asthma, uncomplicated: Secondary | ICD-10-CM | POA: Insufficient documentation

## 2018-03-02 DIAGNOSIS — Z72821 Inadequate sleep hygiene: Secondary | ICD-10-CM | POA: Diagnosis not present

## 2018-03-02 DIAGNOSIS — G8929 Other chronic pain: Secondary | ICD-10-CM

## 2018-03-02 DIAGNOSIS — M25561 Pain in right knee: Secondary | ICD-10-CM

## 2018-03-02 DIAGNOSIS — IMO0001 Reserved for inherently not codable concepts without codable children: Secondary | ICD-10-CM | POA: Diagnosis present

## 2018-03-02 DIAGNOSIS — E079 Disorder of thyroid, unspecified: Secondary | ICD-10-CM | POA: Diagnosis not present

## 2018-03-02 DIAGNOSIS — I081 Rheumatic disorders of both mitral and tricuspid valves: Secondary | ICD-10-CM | POA: Insufficient documentation

## 2018-03-02 DIAGNOSIS — E1122 Type 2 diabetes mellitus with diabetic chronic kidney disease: Secondary | ICD-10-CM | POA: Diagnosis not present

## 2018-03-02 DIAGNOSIS — F341 Dysthymic disorder: Secondary | ICD-10-CM

## 2018-03-02 DIAGNOSIS — E039 Hypothyroidism, unspecified: Secondary | ICD-10-CM | POA: Insufficient documentation

## 2018-03-02 DIAGNOSIS — I5022 Chronic systolic (congestive) heart failure: Secondary | ICD-10-CM | POA: Diagnosis present

## 2018-03-02 DIAGNOSIS — E119 Type 2 diabetes mellitus without complications: Secondary | ICD-10-CM | POA: Diagnosis present

## 2018-03-02 DIAGNOSIS — Z6841 Body Mass Index (BMI) 40.0 and over, adult: Secondary | ICD-10-CM

## 2018-03-02 DIAGNOSIS — I2510 Atherosclerotic heart disease of native coronary artery without angina pectoris: Secondary | ICD-10-CM | POA: Diagnosis not present

## 2018-03-02 DIAGNOSIS — Z7901 Long term (current) use of anticoagulants: Secondary | ICD-10-CM | POA: Diagnosis not present

## 2018-03-02 DIAGNOSIS — I5043 Acute on chronic combined systolic (congestive) and diastolic (congestive) heart failure: Secondary | ICD-10-CM | POA: Diagnosis not present

## 2018-03-02 DIAGNOSIS — I482 Chronic atrial fibrillation: Secondary | ICD-10-CM | POA: Insufficient documentation

## 2018-03-02 DIAGNOSIS — D869 Sarcoidosis, unspecified: Secondary | ICD-10-CM | POA: Diagnosis not present

## 2018-03-02 DIAGNOSIS — I483 Typical atrial flutter: Secondary | ICD-10-CM | POA: Diagnosis not present

## 2018-03-02 DIAGNOSIS — Z79899 Other long term (current) drug therapy: Secondary | ICD-10-CM | POA: Insufficient documentation

## 2018-03-02 DIAGNOSIS — Z794 Long term (current) use of insulin: Secondary | ICD-10-CM | POA: Insufficient documentation

## 2018-03-02 DIAGNOSIS — K589 Irritable bowel syndrome without diarrhea: Secondary | ICD-10-CM | POA: Insufficient documentation

## 2018-03-02 DIAGNOSIS — N183 Chronic kidney disease, stage 3 (moderate): Secondary | ICD-10-CM | POA: Diagnosis not present

## 2018-03-02 DIAGNOSIS — E669 Obesity, unspecified: Secondary | ICD-10-CM | POA: Insufficient documentation

## 2018-03-02 DIAGNOSIS — I480 Paroxysmal atrial fibrillation: Secondary | ICD-10-CM | POA: Insufficient documentation

## 2018-03-02 DIAGNOSIS — I5023 Acute on chronic systolic (congestive) heart failure: Secondary | ICD-10-CM | POA: Diagnosis present

## 2018-03-02 DIAGNOSIS — I130 Hypertensive heart and chronic kidney disease with heart failure and stage 1 through stage 4 chronic kidney disease, or unspecified chronic kidney disease: Principal | ICD-10-CM | POA: Insufficient documentation

## 2018-03-02 DIAGNOSIS — N179 Acute kidney failure, unspecified: Secondary | ICD-10-CM | POA: Insufficient documentation

## 2018-03-02 DIAGNOSIS — Z8659 Personal history of other mental and behavioral disorders: Secondary | ICD-10-CM | POA: Insufficient documentation

## 2018-03-02 DIAGNOSIS — M1A9XX0 Chronic gout, unspecified, without tophus (tophi): Secondary | ICD-10-CM | POA: Diagnosis not present

## 2018-03-02 DIAGNOSIS — Z888 Allergy status to other drugs, medicaments and biological substances status: Secondary | ICD-10-CM | POA: Insufficient documentation

## 2018-03-02 DIAGNOSIS — E785 Hyperlipidemia, unspecified: Secondary | ICD-10-CM

## 2018-03-02 DIAGNOSIS — I509 Heart failure, unspecified: Secondary | ICD-10-CM

## 2018-03-02 DIAGNOSIS — I10 Essential (primary) hypertension: Secondary | ICD-10-CM

## 2018-03-02 DIAGNOSIS — F411 Generalized anxiety disorder: Secondary | ICD-10-CM | POA: Insufficient documentation

## 2018-03-02 DIAGNOSIS — I110 Hypertensive heart disease with heart failure: Secondary | ICD-10-CM | POA: Diagnosis not present

## 2018-03-02 DIAGNOSIS — R0602 Shortness of breath: Secondary | ICD-10-CM | POA: Diagnosis not present

## 2018-03-02 DIAGNOSIS — I429 Cardiomyopathy, unspecified: Secondary | ICD-10-CM

## 2018-03-02 DIAGNOSIS — Z8619 Personal history of other infectious and parasitic diseases: Secondary | ICD-10-CM | POA: Insufficient documentation

## 2018-03-02 DIAGNOSIS — I4891 Unspecified atrial fibrillation: Secondary | ICD-10-CM | POA: Diagnosis present

## 2018-03-02 DIAGNOSIS — M109 Gout, unspecified: Secondary | ICD-10-CM | POA: Insufficient documentation

## 2018-03-02 DIAGNOSIS — I4819 Other persistent atrial fibrillation: Secondary | ICD-10-CM

## 2018-03-02 DIAGNOSIS — F419 Anxiety disorder, unspecified: Secondary | ICD-10-CM | POA: Diagnosis not present

## 2018-03-02 DIAGNOSIS — I481 Persistent atrial fibrillation: Secondary | ICD-10-CM | POA: Insufficient documentation

## 2018-03-02 DIAGNOSIS — F329 Major depressive disorder, single episode, unspecified: Secondary | ICD-10-CM | POA: Insufficient documentation

## 2018-03-02 DIAGNOSIS — M25562 Pain in left knee: Secondary | ICD-10-CM

## 2018-03-02 HISTORY — DX: Obesity, unspecified: E11.69

## 2018-03-02 HISTORY — DX: Hypothyroidism, unspecified: E03.9

## 2018-03-02 HISTORY — DX: Type 2 diabetes mellitus with other specified complication: E66.9

## 2018-03-02 HISTORY — DX: Obesity, unspecified: E66.9

## 2018-03-02 LAB — CBC WITH DIFFERENTIAL/PLATELET
Abs Immature Granulocytes: 0 10*3/uL (ref 0.0–0.1)
Basophils Absolute: 0 10*3/uL (ref 0.0–0.1)
Basophils Relative: 0 %
Eosinophils Absolute: 0.1 10*3/uL (ref 0.0–0.7)
Eosinophils Relative: 1 %
HCT: 36.3 % (ref 36.0–46.0)
Hemoglobin: 11.3 g/dL — ABNORMAL LOW (ref 12.0–15.0)
Immature Granulocytes: 0 %
Lymphocytes Relative: 43 %
Lymphs Abs: 2.4 10*3/uL (ref 0.7–4.0)
MCH: 30.2 pg (ref 26.0–34.0)
MCHC: 31.1 g/dL (ref 30.0–36.0)
MCV: 97.1 fL (ref 78.0–100.0)
Monocytes Absolute: 0.4 10*3/uL (ref 0.1–1.0)
Monocytes Relative: 7 %
Neutro Abs: 2.7 10*3/uL (ref 1.7–7.7)
Neutrophils Relative %: 49 %
Platelets: 173 10*3/uL (ref 150–400)
RBC: 3.74 MIL/uL — ABNORMAL LOW (ref 3.87–5.11)
RDW: 13.6 % (ref 11.5–15.5)
WBC: 5.5 10*3/uL (ref 4.0–10.5)

## 2018-03-02 LAB — GLUCOSE, CAPILLARY
Glucose-Capillary: 124 mg/dL — ABNORMAL HIGH (ref 65–99)
Glucose-Capillary: 176 mg/dL — ABNORMAL HIGH (ref 65–99)

## 2018-03-02 LAB — I-STAT TROPONIN, ED: TROPONIN I, POC: 0.01 ng/mL (ref 0.00–0.08)

## 2018-03-02 LAB — I-STAT CHEM 8, ED
BUN: 26 mg/dL — AB (ref 6–20)
Calcium, Ion: 1.18 mmol/L (ref 1.15–1.40)
Chloride: 108 mmol/L (ref 101–111)
Creatinine, Ser: 1 mg/dL (ref 0.44–1.00)
Glucose, Bld: 133 mg/dL — ABNORMAL HIGH (ref 65–99)
HEMATOCRIT: 36 % (ref 36.0–46.0)
HEMOGLOBIN: 12.2 g/dL (ref 12.0–15.0)
Potassium: 4.4 mmol/L (ref 3.5–5.1)
Sodium: 143 mmol/L (ref 135–145)
TCO2: 25 mmol/L (ref 22–32)

## 2018-03-02 LAB — TROPONIN I: Troponin I: <0.03 ng/mL (ref <0.03)

## 2018-03-02 LAB — BRAIN NATRIURETIC PEPTIDE: B Natriuretic Peptide: 544.8 pg/mL — ABNORMAL HIGH (ref 0.0–100.0)

## 2018-03-02 MED ORDER — ALPRAZOLAM 0.5 MG PO TABS
1.0000 mg | ORAL_TABLET | Freq: Every evening | ORAL | Status: DC | PRN
  Administered 2018-03-02 – 2018-03-04 (×2): 1 mg via ORAL
  Filled 2018-03-02 (×2): qty 2

## 2018-03-02 MED ORDER — ROSUVASTATIN CALCIUM 20 MG PO TABS
20.0000 mg | ORAL_TABLET | Freq: Every day | ORAL | Status: DC
  Administered 2018-03-02 – 2018-03-03 (×2): 20 mg via ORAL
  Filled 2018-03-02 (×2): qty 1

## 2018-03-02 MED ORDER — INSULIN ASPART 100 UNIT/ML ~~LOC~~ SOLN
0.0000 [IU] | Freq: Three times a day (TID) | SUBCUTANEOUS | Status: DC
  Administered 2018-03-02 – 2018-03-03 (×2): 2 [IU] via SUBCUTANEOUS
  Administered 2018-03-03: 3 [IU] via SUBCUTANEOUS
  Administered 2018-03-03: 2 [IU] via SUBCUTANEOUS
  Administered 2018-03-04: 3 [IU] via SUBCUTANEOUS

## 2018-03-02 MED ORDER — ASPIRIN EC 81 MG PO TBEC
81.0000 mg | DELAYED_RELEASE_TABLET | Freq: Every day | ORAL | Status: DC
  Administered 2018-03-02 – 2018-03-03 (×2): 81 mg via ORAL
  Filled 2018-03-02 (×2): qty 1

## 2018-03-02 MED ORDER — INSULIN GLARGINE-LIXISENATIDE 100-33 UNT-MCG/ML ~~LOC~~ SOPN: 15.0000 [IU] | PEN_INJECTOR | Freq: Every day | SUBCUTANEOUS | Status: DC

## 2018-03-02 MED ORDER — SACUBITRIL-VALSARTAN 24-26 MG PO TABS
1.0000 | ORAL_TABLET | Freq: Two times a day (BID) | ORAL | Status: DC
  Administered 2018-03-02 – 2018-03-04 (×4): 1 via ORAL
  Filled 2018-03-02 (×5): qty 1

## 2018-03-02 MED ORDER — DABIGATRAN ETEXILATE MESYLATE 150 MG PO CAPS
150.0000 mg | ORAL_CAPSULE | Freq: Two times a day (BID) | ORAL | Status: DC
  Administered 2018-03-02 – 2018-03-04 (×4): 150 mg via ORAL
  Filled 2018-03-02 (×5): qty 1

## 2018-03-02 MED ORDER — FUROSEMIDE 10 MG/ML IJ SOLN
40.0000 mg | Freq: Once | INTRAMUSCULAR | Status: AC
  Administered 2018-03-02: 40 mg via INTRAVENOUS
  Filled 2018-03-02: qty 4

## 2018-03-02 MED ORDER — ONDANSETRON HCL 4 MG/2ML IJ SOLN: 4.0000 mg | Freq: Four times a day (QID) | INTRAMUSCULAR | Status: DC | PRN

## 2018-03-02 MED ORDER — NITROGLYCERIN IN D5W 200-5 MCG/ML-% IV SOLN
0.0000 ug/min | Freq: Once | INTRAVENOUS | Status: AC
  Administered 2018-03-02: 5 ug/min via INTRAVENOUS
  Filled 2018-03-02: qty 250

## 2018-03-02 MED ORDER — INSULIN ASPART 100 UNIT/ML ~~LOC~~ SOLN: 0.0000 [IU] | Freq: Every day | SUBCUTANEOUS | Status: DC

## 2018-03-02 MED ORDER — LEVOTHYROXINE SODIUM 50 MCG PO TABS: 50.0000 ug | ORAL_TABLET | Freq: Every day | ORAL | 1 refills | Status: DC

## 2018-03-02 MED ORDER — SODIUM CHLORIDE 0.9 % IV SOLN: 250.0000 mL | INTRAVENOUS | Status: DC | PRN

## 2018-03-02 MED ORDER — SODIUM CHLORIDE 0.9% FLUSH: 3.0000 mL | INTRAVENOUS | Status: DC | PRN

## 2018-03-02 MED ORDER — FUROSEMIDE 10 MG/ML IJ SOLN
40.0000 mg | Freq: Two times a day (BID) | INTRAMUSCULAR | Status: DC
  Administered 2018-03-02 – 2018-03-03 (×3): 40 mg via INTRAVENOUS
  Filled 2018-03-02 (×3): qty 4

## 2018-03-02 MED ORDER — INSULIN GLARGINE 100 UNIT/ML ~~LOC~~ SOLN
15.0000 [IU] | Freq: Every day | SUBCUTANEOUS | Status: DC
Start: 2018-03-02 — End: 2018-03-04
  Administered 2018-03-02 – 2018-03-03 (×2): 15 [IU] via SUBCUTANEOUS
  Filled 2018-03-02 (×3): qty 0.15

## 2018-03-02 MED ORDER — ACETAMINOPHEN 325 MG PO TABS
650.0000 mg | ORAL_TABLET | ORAL | Status: DC | PRN
  Administered 2018-03-03: 650 mg via ORAL
  Filled 2018-03-02: qty 2

## 2018-03-02 MED ORDER — CARVEDILOL 25 MG PO TABS
25.0000 mg | ORAL_TABLET | Freq: Two times a day (BID) | ORAL | Status: DC
  Administered 2018-03-02 – 2018-03-04 (×4): 25 mg via ORAL
  Filled 2018-03-02 (×4): qty 1

## 2018-03-02 MED ORDER — SODIUM CHLORIDE 0.9% FLUSH
3.0000 mL | Freq: Two times a day (BID) | INTRAVENOUS | Status: DC
  Administered 2018-03-02 – 2018-03-03 (×3): 3 mL via INTRAVENOUS

## 2018-03-02 NOTE — ED Notes (Signed)
Dr. Yates at bedside.  

## 2018-03-02 NOTE — ED Notes (Signed)
[place]ie PA at bedside 

## 2018-03-02 NOTE — Progress Notes (Signed)
Subjective: Chief Complaint  Patient presents with  . Medication Management   Here for med check.    Medical team: Dr. [name], cardiology Dr. Hardie Pulley, podiatry Dr. Winnebago Cellar, GI Dr. Charlann Boxer (initially consulted for knee pain only)  Ravyn Nikkel, Camelia Eng, PA-C here for primary care  She notes feeling SOB, having significant leg swelling, skin sores on legs and right hand, not feeling well, feeling weak.   She has to take several breaks getting up the stairs into her house.  This has been gradually worsening.    She saw Dr. Tamala Julian yesterday.  (Of note, she was initially sent to Dr. Tamala Julian for knee issues a few months ago, however, he did labs on her yesterday for the swelling c/o).  At her last visit here she states today that she never started the [facility] for diabetes as she wasn't sure how to use this.  She also feels unsafe to take some of her medications. She says her pharmacist and somebody else told her the allopurinol was causing her problems so she was scared to use these.   She also was scared to go too high on the fluid pills.  She also feels like she is getting different advice from different doctors that is inconsistent.   She says she hasn't had a call from cardiology to return and last visit was 04/2017.     She has been sleeping well due to the leg swelling.  This past year she had abnormal thyroid labs.  Initially ended up seeing endocrinology with Dr. Renne Crigler, but apparently her personality clashed and didn't want to see Dr. Cruzita Lederer again.    She came by public bus today.  Past Medical History:  Diagnosis Date  . Anemia   . Ankle fracture, right    x 2  . Antineoplastic and immunosuppressive drugs causing adverse effect in therapeutic use   . Asthma   . Atrial fibrillation ([place])   . Chronic venous hypertension without complications   . Colon polyps   . Colon polyps 2009  . Coronary artery disease    no CAD by cath 11.2010  . Depressive disorder,  not elsewhere classified   . Diastolic CHF, chronic ([place])   . Diverticulosis of colon 2009   colonoscopy  . Dyslipidemia   . Glucose intolerance (impaired glucose tolerance)    hgb AIC 6.1 09/02/2009  . Gout   . Hepatitis, unspecified    hx of  . Hx of hysterectomy 2002  . Hyperlipidemia   . Hypertension   . Irritable bowel syndrome   . Lung nodule    30mm RLL nodule on CT Chest  07/2009, resolved by 2011 CT  . Noncompliance   . Obesity   . Persistent atrial fibrillation ([place])    s/p DCCV 07/2009; Pradaxa Rx  . PONV (postoperative nausea and vomiting)   . Sarcoidosis    dx by eye exam  . Sleep disturbance 06/2013   sleep study, mild abnormality, no criteria for CPAP   Current Outpatient Medications on File Prior to Visit  Medication Sig Dispense Refill  . allopurinol (ZYLOPRIM) 300 MG tablet TAKE 300 MG BY MOUTH DAILY AS NEEDED FOR GOUT 90 tablet 2  . ALPRAZolam (XANAX) 1 MG tablet Take 1 tablet (1 mg total) by mouth at bedtime as needed for anxiety. 45 tablet 1  . carvedilol (COREG) 25 MG tablet TAKE 1 TABLET(25 MG) BY MOUTH TWICE DAILY 180 tablet 1  . colchicine (COLCRYS) 0.6 MG tablet Take  1 tablet (0.6 mg total) by mouth daily as needed (GOUT). Reported on 01/21/2016 30 tablet 1  . dabigatran (PRADAXA) 150 MG CAPS capsule Take 1 capsule (150 mg total) by mouth 2 (two) times daily. 60 capsule 5  . ENTRESTO 24-26 MG TAKE 1 TABLET BY MOUTH TWICE DAILY 180 tablet 0  . furosemide (LASIX) 40 MG tablet Take 1.5 tablets (60 mg total) by mouth daily. 45 tablet 6  . Insulin Glargine-Lixisenatide (SOLIQUA) 100-33 UNT-MCG/ML SOPN Inject 15 Units into the skin at bedtime. 3 mL 2  . metoprolol succinate (TOPROL-XL) 50 MG 24 hr tablet TAKE 1 TABLET BY MOUTH TWICE DAILY. TAKE WITH OR IMMEDIATELY FOLLOWING A MEAL 180 tablet 1  . potassium chloride SA (K-DUR,KLOR-CON) 20 MEQ tablet Take 1 tablet (20 mEq total) by mouth daily. 90 tablet 3  . SYMBICORT 160-4.5 MCG/ACT inhaler INHALE 2 PUFFS INTO  THE LUNGS TWICE DAILY 10.2 g 0  . gabapentin (NEURONTIN) 100 MG capsule Take 2 capsules (200 mg total) by mouth at bedtime. (Patient not taking: Reported on 03/02/2018) 60 capsule 3  . rosuvastatin (CRESTOR) 20 MG tablet Take 1 tablet (20 mg total) by mouth daily. (Patient not taking: Reported on 03/02/2018) 90 tablet 3   No current facility-administered medications on file prior to visit.    ROS as in subjective   Objective: BP 140/88   Pulse (!) 57   Temp 98.1 F (36.7 C) (Oral)   Ht 5' 2.5" (1.588 m)   Wt 274 lb 6.4 oz (124.5 kg)   SpO2 97%   BMI 49.39 kg/m   Wt Readings from Last 3 Encounters:  03/02/18 274 lb 6.4 oz (124.5 kg)  03/01/18 276 lb (125.2 kg)  01/25/18 271 lb (122.9 kg)   Gen: wd, wn, a little dyspneic Skin: several brown round skin lesions on bilat lower legs and dry skin of right palm Lungs decreased breath sounds but no obvious rhonchi or rales or wheezes Heart irregularly irregular, no murmurs, somewhat distant sounds 2+ LE edema throughout, pitting Pulses 1+ somewhat week, cool legs to touch Psych: pleasant, answers questions appropriately   Adult ECG Report  Indication: dyspnea, hx/o CHF and afib  Rate: 116 bpm  Rhythm: atrial fibrillation  QRS Axis: -11 degrees  PR Interval: unable to calculate  QRS Duration: 127ms  QTc: 546ms  Conduction Disturbances: LVH, T wave abnormality  Other Abnormalities: none  Patient's cardiac risk factors are: advanced age (older than 25 for men, 18 for women), dyslipidemia, hypertension and obesity (BMI >= 30 kg/m2).  EKG comparison: none  Narrative Interpretation: abnormal EKG     Assessment: Encounter Diagnoses  Name Primary?  . CHRONIC DIASTOLIC HEART FAILURE Yes  . Persistent atrial fibrillation ([place])   . Essential hypertension   . Cardiomyopathy, secondary ([place])   . Acute on chronic systolic heart failure ([place])   . Thyroid disease   . Dyslipidemia   . Class 3 severe obesity due to excess calories with  serious comorbidity and body mass index (BMI) of 40.0 to 44.9 in adult [facility])   . Chronic gout without tophus, unspecified cause, unspecified site   . Dysthymic disorder   . Poor sleep hygiene   . Noncompliance   . Chronic pain of both knees   . History of behavioral and mental health problems       Plan: I reviewed labs from her visit at another office yesterday showing BNP 715! Elevated.   Uric acid 7.8 yesterday elevated, creatinine 1.25, glucose elevated.  See rest of labs in [place] from yesterday.  Given her symptoms and exam findings, called EMS for transport to emergency dept for further evaluation and treatment of Afib and acute on chronic CHF.    With the assumption of her getting admitted, I recommend social worker eval as well.  I feel that her dysthymia and anxiety significantly affect her compliance with visits, medications, follow up.  She relies on public transportation.  She has been advised in the past to seek counseling for relationship issue that affect her sleep.   Diabetes - she is noncompliant with Soliqua.   We advised she come in for nurse visit after last visit here to be instructed on proper use of Soliqua.  Instead she just never started the medication, nor did she let me know she wasn't going to take it.  Abnormal thyroid levels - she reportedly is taking synthroid currently, but I questions her compliance with this.  She is not compliant with statin.  Hx/o CHF - noncompliance with diuretics, followup  Gout - noncompliance with medications for prevention, Allopurinol.   Liliann was seen today for medication management.  Diagnoses and all orders for this visit:  CHRONIC DIASTOLIC HEART FAILURE -     EKG 12-Lead  Persistent atrial fibrillation (HCC)  Essential hypertension  Cardiomyopathy, secondary ([place])  Acute on chronic systolic heart failure (HCC)  Thyroid disease  Dyslipidemia  Class 3 severe obesity due to excess calories with serious  comorbidity and body mass index (BMI) of 40.0 to 44.9 in adult [facility])  Chronic gout without tophus, unspecified cause, unspecified site  Dysthymic disorder  Poor sleep hygiene  Noncompliance  Chronic pain of both knees  History of behavioral and mental health problems

## 2018-03-02 NOTE — ED Notes (Signed)
Attempted to call report

## 2018-03-02 NOTE — ED Triage Notes (Signed)
Pt here from home with c/o sob , worse with exertion , pt stopped taking her lasix 6 out of the last 7 days

## 2018-03-02 NOTE — H&P (Signed)
History and Physical    Stephanie [name] KNL:[id number] DOB: [birth year] DOA: 03/02/2018  PCP: Carlena Hurl, PA-C Consultants:  Klein/Smith - cardiology; Buddy Duty - endocrinology; Irene Limbo - oncology Patient coming from:  Home - lives with son; NOK: son, Pleas Patricia, [place]  Chief Complaint: many  HPI: Stephanie [name] is a 67 y.o. female with medical history significant of sarcoidosis; afib on Pradaxa; HTN; HLD;  CAD; chronic diastolic CHF presenting with CHF.  "I just haven't been able to, you, know, I've been going to different doctors."  She has been to a variety of doctors and she thinks the doctors felt like she wasn't being compliant.  "I noticed ever since then none of them ever called me back or nothing.  None of them ever called me back."  Not breathing good, swelling, broke out in a rash, putting on weight, swelling a lot.  She can't wear her shoes because her feet are big, sores on her legs from swelling.  +SOB, all the time.  She has been sleeping up in a chair at night - to prevent going up and down steps because she is so SOB.  ?PND.  +cough, slight, nonproductive.  It is not clear whether she just got frustrated by the doctors or misunderstood the instructions, but she has stopped taking several of her medications - specifically Entresto and Lasix.  She has taken the Lasix once or twice this week and otherwise none in the last week or two.  ED Course:  CHF from noncompliance.  She reports taking multiple medications from different doctors, unsure what to take, so not taking Lasix.  BNP 700s.  CXR with vascular congestion.  No O2.  BP increased.  Likely subacute exacerbation, needs observation.  She was started on a NTG drip to see if this would improve her BP and ?chest pressure.  Review of Systems: As per HPI; otherwise review of systems reviewed and negative.   Ambulatory Status:  Ambulates without assistance  Past Medical History:  Diagnosis Date  . Anemia   . Ankle fracture,  right    x 2  . Antineoplastic and immunosuppressive drugs causing adverse effect in therapeutic use   . Asthma   . Chronic venous hypertension without complications   . Colon polyps 2009  . Coronary artery disease    no CAD by cath 11.2010  . Depressive disorder, not elsewhere classified   . Diabetes mellitus type 2 in obese (HCC)    hgb AIC 6.1 09/02/2009  . Diastolic CHF, chronic ([place])   . Diverticulosis of colon 2009   colonoscopy  . Gout   . Hepatitis, unspecified    hx of  . Hx of hysterectomy 2002  . Hyperlipidemia   . Hypertension   . Hypothyroidism   . Irritable bowel syndrome   . Lung nodule    85m RLL nodule on CT Chest  07/2009, resolved by 2011 CT  . Noncompliance   . Obesity   . Persistent atrial fibrillation ([place]    s/p DCCV 07/2009; Pradaxa Rx  . PONV (postoperative nausea and vomiting)   . Sarcoidosis    dx by eye exam  . Sleep disturbance 06/2013   sleep study, mild abnormality, no criteria for CPAP    Past Surgical History:  Procedure Laterality Date  . ABDOMINAL HYSTERECTOMY    . CARDIOVERSION     x 2  . CARDIOVERSION N/A 02/27/2013   Procedure: CARDIOVERSION;  Surgeon: BLelon Perla MD;  [place]  Service: Cardiovascular;  Laterality: N/A;  . CARDIOVERSION N/A 03/24/2017   Procedure: CARDIOVERSION;  Surgeon: Josue Hector, MD;  Location: [facility] ENDOSCOPY;  Service: Cardiovascular;  Laterality: N/A;  . CHOLECYSTECTOMY    . COLONOSCOPY     Diverticulosis, colon polyps, repeat 2014, Dr. Deatra Ina  . ESOPHAGOGASTRODUODENOSCOPY N/A 05/16/2014   Procedure: ESOPHAGOGASTRODUODENOSCOPY (EGD);  Surgeon: Wonda Horner, MD;  Location: [facility] ORS;  Service: Gastroenterology;  Laterality: N/A;  . HERNIA REPAIR  2009    Social History   Socioeconomic History  . Marital status: Single    Spouse name: Not on file  . Number of children: 5  . Years of education: college  . Highest education level: Not on file  Occupational History  . Occupation:  Disabled.  Social Needs  . Financial resource strain: Not on file  . Food insecurity:    Worry: Not on file    Inability: Not on file  . Transportation needs:    Medical: Not on file    Non-medical: Not on file  Tobacco Use  . Smoking status: Never Smoker  . Smokeless tobacco: Never Used  Substance and Sexual Activity  . Alcohol use: No    Alcohol/week: 0.0 oz  . Drug use: No  . Sexual activity: Not Currently    Birth control/protection: Surgical  Lifestyle  . Physical activity:    Days per week: Not on file    Minutes per session: Not on file  . Stress: Not on file  Relationships  . Social connections:    Talks on phone: Not on file    Gets together: Not on file    Attends religious service: Not on file    Active member of club or organization: Not on file    Attends meetings of clubs or organizations: Not on file    Relationship status: Not on file  . Intimate partner violence:    Fear of current or ex partner: Not on file    Emotionally abused: Not on file    Physically abused: Not on file    Forced sexual activity: Not on file  Other Topics Concern  . Not on file  Social History Narrative   Pt lives in [place] with her son.  Unemployed.  Right handed.     Education college    Allergies  Allergen Reactions  . Amiodarone Other (See Comments)    adverse reaction: Tremor/gait disurbance  . Atorvastatin Other (See Comments)    Body aches and pains--stiffness.   [name] [Pitavastatin]     Body aches  . Propoxyphene N-Acetaminophen Nausea And Vomiting  . Albuterol Palpitations    Palpitations, intolerance    Family History  Problem Relation Age of Onset  . Pneumonia Father        deceased  . Hypertension Father   . Cancer Father   . Multiple sclerosis Mother        hx  . Emphysema Other        runs in the family    Prior to Admission medications   Medication Sig Start Date End Date Taking? Authorizing Provider  allopurinol (ZYLOPRIM) 300 MG  tablet TAKE 300 MG BY MOUTH DAILY AS NEEDED FOR GOUT 09/08/17   Tysinger, Camelia Eng, PA-C  ALPRAZolam Duanne Moron) 1 MG tablet Take 1 tablet (1 mg total) by mouth at bedtime as needed for anxiety. 11/25/17   Tysinger, Camelia Eng, PA-C  carvedilol (COREG) 25 MG tablet TAKE 1 TABLET(25 MG) BY MOUTH TWICE DAILY 11/29/17   Tysinger,  Camelia Eng, PA-C  colchicine (COLCRYS) 0.6 MG tablet Take 1 tablet (0.6 mg total) by mouth daily as needed (GOUT). Reported on 01/21/2016 11/25/17   Tysinger, Camelia Eng, PA-C  dabigatran (PRADAXA) 150 MG CAPS capsule Take 1 capsule (150 mg total) by mouth 2 (two) times daily. 01/31/18   Deboraha Sprang, MD  ENTRESTO 24-26 MG TAKE 1 TABLET BY MOUTH TWICE DAILY 02/23/18   Tysinger, Camelia Eng, PA-C  furosemide (LASIX) 40 MG tablet Take 1.5 tablets (60 mg total) by mouth daily. 11/26/17   Tysinger, Camelia Eng, PA-C  gabapentin (NEURONTIN) 100 MG capsule Take 2 capsules (200 mg total) by mouth at bedtime. Patient not taking: Reported on 03/02/2018 01/25/18   Lyndal Pulley, DO  Insulin Glargine-Lixisenatide [facility]) 100-33 UNT-MCG/ML SOPN Inject 15 Units into the skin at bedtime. 11/26/17   Tysinger, Camelia Eng, PA-C  levothyroxine (SYNTHROID, LEVOTHROID) 50 MCG tablet Take 1 tablet (50 mcg total) by mouth daily before breakfast. 03/02/18   Lyndal Pulley, DO  metoprolol succinate (TOPROL-XL) 50 MG 24 hr tablet TAKE 1 TABLET BY MOUTH TWICE DAILY. TAKE WITH OR IMMEDIATELY FOLLOWING A MEAL 11/29/17   Tysinger, Camelia Eng, PA-C  potassium chloride SA (K-DUR,KLOR-CON) 20 MEQ tablet Take 1 tablet (20 mEq total) by mouth daily. 11/26/17   Tysinger, Camelia Eng, PA-C  rosuvastatin (CRESTOR) 20 MG tablet Take 1 tablet (20 mg total) by mouth daily. Patient not taking: Reported on 03/02/2018 11/26/17   Carlena Hurl, PA-C  SYMBICORT 160-4.5 MCG/ACT inhaler INHALE 2 PUFFS INTO THE LUNGS TWICE DAILY 08/31/17   Carlena Hurl, PA-C    Physical Exam: Vitals:   03/02/18 1545 03/02/18 1600 03/02/18 1658 03/02/18 1705  BP: (!)  149/100 (!) 152/117 (!) 184/103 (!) 165/93  Pulse: 77 73 (!) 117 63  Resp: 18 (!) 26 20   Temp:   98.2 F (36.8 C)   TempSrc:   Oral   SpO2: 96% 97% 100%   Weight:   122.7 kg (270 lb 8 oz)   Height:   5' 2.5" (1.588 m)      General:  Appears calm and comfortable and is NAD, on room air Eyes:  PERRL, EOMI, normal lids, iris ENT:  grossly normal hearing, lips & tongue, mmm; appropriate dentition Neck:  no LAD, masses or thyromegaly; no carotid bruits Cardiovascular:  Irregularly irregular but rate controlled, no m/r/g. 1-2+ taut LE edema.  Respiratory:   CTA bilaterally with no wheezes/rales/rhonchi.  Normal respiratory effort. Abdomen:  soft, NT, ND, NABS Skin: scattered excoriations on her LE Musculoskeletal:  grossly normal tone BUE/BLE, good ROM, no bony abnormality Psychiatric:  grossly normal mood and affect, speech fluent and appropriate, AOx3 Neurologic:  CN 2-12 grossly intact, moves all extremities in coordinated fashion, sensation intact    Radiological Exams on Admission: Dg Chest 2 View  Result Date: 03/02/2018 CLINICAL DATA:  Shortness of breath for 2-3 weeks, worse today. Chest heaviness. EXAM: CHEST - 2 VIEW COMPARISON:  05/13/2017 FINDINGS: Moderate enlargement of the cardiac silhouette is similar to the prior study. There is mild central pulmonary vascular congestion without overt edema, segmental airspace consolidation, pleural effusion, or pneumothorax. No acute osseous abnormality is seen. IMPRESSION: Cardiomegaly and mild pulmonary vascular congestion. Electronically Signed   By: Logan Bores M.D.   On: 03/02/2018 14:52    EKG: Independently reviewed.  Afib with rate 103; nonspecific ST changes with no evidence of acute ischemia   Labs on Admission: I have personally reviewed the available  labs and imaging studies at the time of the admission.  Pertinent labs:   Troponin 0.01 BNP 544.8 Normal ionized calcium Hgb 11.3 WBC 5.5 TSH 6.97 on 5/14 ESR 44 on  5/14 Glucose 133  Assessment/Plan Principal Problem:   Acute on chronic systolic CHF (congestive heart failure) (HCC) Active Problems:   Essential hypertension   Atrial fibrillation (HCC)   Dyslipidemia   Type 2 diabetes mellitus with complication, without long-term current use of insulin (HCC)   Noncompliance   Acute on chronic CHF -Patient presenting with multiple complaints, many likely related to suboptimal CHF -CXR with cardiomegaly and mild pulmonary vascular congestion -Normal WBC count, does not appear to be infectious -Elevated BNP -Patient with chronic afib, relatively rate controlled -With elevated BNP and abnl CXR, recurrent CHF seems much more probable as diagnosis; likely subacute, as the patient does not appear to be compliant with her medications -Will place in observation status with telemetry -Will request repeat echocardiogram -Will resume Entresto - this appears to be one of the medications she recently stopped -Will continue Coreg but stop Metoprolol - it does not appear that she needs both medications -CHF order set utilized; may need CHF team consult but will hold until Echo results are available -Will request cardiology consult via [place] for the AM -Was given Lasix 40 mg x 1 in ER and will repeat with 40 mg IV BID -She is on room air -Relatively normal kidney function at this time, will follow -Repeat EKG in AM -Will r/o with serial troponins although doubt ACS based on symptoms -I do not see an indication for NTG drip at this time  HTN -Continue Coreg and Entresto  HLD -Continue/resume Crestor (was not taking this medication)  -Check lipids  DM -Will check A1c -Resume Soliqua - she was not taking this medication -Cover with moderate-scale SSI  Noncompliance -Needs ongoing counseling about this issue -This is likely the primary reason for today's hospitalization  Afib -Rate controlled on Coreg -Continue Pradaxa  DVT prophylaxis:  Pradaxa Code Status:  Full - confirmed with patient Family Communication: None present Disposition Plan:  Home once clinically improved Consults called: CM Admission status: It is my clinical opinion that referral for OBSERVATION is reasonable and necessary in this patient based on the above information provided. The aforementioned taken together are felt to place the patient at high risk for further clinical deterioration. However it is anticipated that the patient may be medically stable for discharge from the hospital within 24 to 48 hours.    Karmen Bongo MD [facility]  If note is complete, please contact covering daytime or nighttime physician. [email or web address] Password [facility]  03/02/2018, 5:14 PM

## 2018-03-02 NOTE — ED Provider Notes (Signed)
[facility] EMERGENCY DEPARTMENT Provider Note   CSN: [id number] Arrival date & time: 03/02/18  1318     History   Chief Complaint Chief Complaint  Patient presents with  . Shortness of Breath    HPI Stephanie [name] is a 67 y.o. female.  HPI   67 year old female with history of CHF with an EF of 25% on 10/18/16, history of atrial fibrillation currently on Pradaxa, hypertension, hyperlipidemia, obesity presenting for evaluation of shortness of breath sent here via EMS from PCP office for evaluation of shortness of breath.  Patient report for the past 3 weeks up with 2 months she has had increased shortness of breath.  Endorsed shortness of breath with ambulation.  She also noticed increased swelling to her lower extremities bilaterally.  Does complaining of pressure sensation to both her legs from swelling.  She does not complain of fever, chills, headache, lightheadedness, dizziness, chest pain, abdominal pain or back pain.  She started a nonproductive cough since yesterday.  Patient was found to have an elevated BNP of 715 as well as elevated uric acid of 7.8.  She was recommended to come to ER for further evaluation.  Patient does admits to not being compliant with her medication.  States that she is currently on 2-minute medication from different sources and she is unsure which ones to take in free of taking duplicates of her medication.  She has not taken her Lasix for the past week.  She denies any recent travel, no prior history of PE DVT.  She has been compliant on her Pradaxa.  Past Medical History:  Diagnosis Date  . Anemia   . Ankle fracture, right    x 2  . Antineoplastic and immunosuppressive drugs causing adverse effect in therapeutic use   . Asthma   . Atrial fibrillation ([place])   . Chronic venous hypertension without complications   . Colon polyps   . Colon polyps 2009  . Coronary artery disease    no CAD by cath 11.2010  . Depressive disorder, not  elsewhere classified   . Diastolic CHF, chronic ([place])   . Diverticulosis of colon 2009   colonoscopy  . Dyslipidemia   . Glucose intolerance (impaired glucose tolerance)    hgb AIC 6.1 09/02/2009  . Gout   . Hepatitis, unspecified    hx of  . Hx of hysterectomy 2002  . Hyperlipidemia   . Hypertension   . Irritable bowel syndrome   . Lung nodule    95mm RLL nodule on CT Chest  07/2009, resolved by 2011 CT  . Noncompliance   . Obesity   . Persistent atrial fibrillation ([place])    s/p DCCV 07/2009; Pradaxa Rx  . PONV (postoperative nausea and vomiting)   . Sarcoidosis    dx by eye exam  . Sleep disturbance 06/2013   sleep study, mild abnormality, no criteria for CPAP    Patient Active Problem List   Diagnosis Date Noted  . History of behavioral and mental health problems 03/02/2018  . Degenerative arthritis of knee, bilateral 12/22/2017  . Chronic pain of both knees 11/25/2017  . Abdominal pain 06/04/2017  . Nausea 06/04/2017  . Poor sleep hygiene 06/04/2017  . Noncompliance 06/04/2017  . Thyroid disease 05/19/2017  . Typical atrial flutter ([place])   . Thyrotoxicosis 11/24/2016  . Atrial fibrillation with rapid ventricular response ([place]) 11/12/2016  . Demand ischemia ([place]) 11/12/2016  . Chronic diarrhea 11/12/2016  . Atrial fibrillation with RVR ([place]) 11/12/2016  .  Acute systolic CHF (congestive heart failure) ([place])   . Acute on chronic systolic CHF (congestive heart failure) ([place]) 10/18/2016  . Hypokalemia 10/18/2016  . Need for prophylactic vaccination against Streptococcus pneumoniae (pneumococcus) 08/20/2016  . Type 2 diabetes mellitus with complication, without long-term current use of insulin ([place]) 05/12/2016  . Edema 05/12/2016  . B12 deficiency 05/06/2016  . Normocytic anemia 04/15/2016  . Insomnia 01/21/2016  . Pelvic pain in female 08/06/2015  . Screening for breast cancer 08/06/2015  . History of anemia 08/02/2015  . Other specified nutritional anemias  08/02/2015  . Urinary pain 08/02/2015  . Need for prophylactic vaccination and inoculation against influenza 08/02/2015  . Obesity 08/02/2015  . Dyslipidemia 08/02/2015  . History of sarcoidosis 08/02/2015  . Generalized anxiety disorder 08/02/2015  . Gout 08/02/2015  . Abnormal thyroid blood test 08/02/2015  . Physical exam 08/02/2015  . Needs flu shot 08/02/2015  . Dysthymic disorder 08/02/2015  . Ataxia 08/22/2014  . Cardiomyopathy, secondary ([place]) 01/28/2011  . Acute on chronic systolic heart failure ([place]) 01/28/2011  . CHRONIC DIASTOLIC HEART FAILURE 53/66/4403  . Essential hypertension 09/02/2009  . Atrial fibrillation ([place]) 08/20/2009  . Lung nodule 08/20/2009  . CHRONIC VENOUS HYPERTENSION WITHOUT COMPS 11/21/2007    Past Surgical History:  Procedure Laterality Date  . ABDOMINAL HYSTERECTOMY    . CARDIOVERSION     x 2  . CARDIOVERSION N/A 02/27/2013   Procedure: CARDIOVERSION;  Surgeon: Lelon Perla, MD;  Location: [facility] ENDOSCOPY;  Service: Cardiovascular;  Laterality: N/A;  . CARDIOVERSION N/A 03/24/2017   Procedure: CARDIOVERSION;  Surgeon: Josue Hector, MD;  Location: [facility] ENDOSCOPY;  Service: Cardiovascular;  Laterality: N/A;  . CHOLECYSTECTOMY    . COLONOSCOPY     Diverticulosis, colon polyps, repeat 2014, Dr. Deatra Ina  . ESOPHAGOGASTRODUODENOSCOPY N/A 05/16/2014   Procedure: ESOPHAGOGASTRODUODENOSCOPY (EGD);  Surgeon: Wonda Horner, MD;  Location: [facility] ORS;  Service: Gastroenterology;  Laterality: N/A;  . HERNIA REPAIR  2009     OB History   None      Home Medications    Prior to Admission medications   Medication Sig Start Date End Date Taking? Authorizing Provider  allopurinol (ZYLOPRIM) 300 MG tablet TAKE 300 MG BY MOUTH DAILY AS NEEDED FOR GOUT 09/08/17   Tysinger, Camelia Eng, PA-C  ALPRAZolam Duanne Moron) 1 MG tablet Take 1 tablet (1 mg total) by mouth at bedtime as needed for anxiety. 11/25/17   Tysinger, Camelia Eng, PA-C  carvedilol (COREG) 25 MG tablet TAKE 1  TABLET(25 MG) BY MOUTH TWICE DAILY 11/29/17   Tysinger, Camelia Eng, PA-C  colchicine (COLCRYS) 0.6 MG tablet Take 1 tablet (0.6 mg total) by mouth daily as needed (GOUT). Reported on 01/21/2016 11/25/17   Tysinger, Camelia Eng, PA-C  dabigatran (PRADAXA) 150 MG CAPS capsule Take 1 capsule (150 mg total) by mouth 2 (two) times daily. 01/31/18   Deboraha Sprang, MD  ENTRESTO 24-26 MG TAKE 1 TABLET BY MOUTH TWICE DAILY 02/23/18   Tysinger, Camelia Eng, PA-C  furosemide (LASIX) 40 MG tablet Take 1.5 tablets (60 mg total) by mouth daily. 11/26/17   Tysinger, Camelia Eng, PA-C  gabapentin (NEURONTIN) 100 MG capsule Take 2 capsules (200 mg total) by mouth at bedtime. Patient not taking: Reported on 03/02/2018 01/25/18   Lyndal Pulley, DO  Insulin Glargine-Lixisenatide [facility]) 100-33 UNT-MCG/ML SOPN Inject 15 Units into the skin at bedtime. 11/26/17   Tysinger, Camelia Eng, PA-C  levothyroxine (SYNTHROID, LEVOTHROID) 50 MCG tablet Take 1 tablet (50 mcg  total) by mouth daily before breakfast. 03/02/18   Lyndal Pulley, DO  metoprolol succinate (TOPROL-XL) 50 MG 24 hr tablet TAKE 1 TABLET BY MOUTH TWICE DAILY. TAKE WITH OR IMMEDIATELY FOLLOWING A MEAL 11/29/17   Tysinger, Camelia Eng, PA-C  potassium chloride SA (K-DUR,KLOR-CON) 20 MEQ tablet Take 1 tablet (20 mEq total) by mouth daily. 11/26/17   Tysinger, Camelia Eng, PA-C  rosuvastatin (CRESTOR) 20 MG tablet Take 1 tablet (20 mg total) by mouth daily. Patient not taking: Reported on 03/02/2018 11/26/17   Carlena Hurl, PA-C  SYMBICORT 160-4.5 MCG/ACT inhaler INHALE 2 PUFFS INTO THE LUNGS TWICE DAILY 08/31/17   Tysinger, Camelia Eng, PA-C    Family History Family History  Problem Relation Age of Onset  . Pneumonia Father        deceased  . Hypertension Father   . Cancer Father   . Multiple sclerosis Mother        hx  . Emphysema Other        runs in the family    Social History Social History   Tobacco Use  . Smoking status: Never Smoker  . Smokeless tobacco: Never Used  Substance  Use Topics  . Alcohol use: No    Alcohol/week: 0.0 oz  . Drug use: No     Allergies   Albuterol; Amiodarone; Atorvastatin; Livalo [pitavastatin]; and Propoxyphene n-acetaminophen   Review of Systems Review of Systems  All other systems reviewed and are negative.    Physical Exam Updated Vital Signs There were no vitals taken for this visit.  Physical Exam  Constitutional: She appears well-developed and well-nourished. No distress.  Obese female resting comfortably in bed in no acute discomfort.  She is able to speak in complete sentences.  HENT:  Head: Atraumatic.  Eyes: Conjunctivae are normal.  Neck: Neck supple. No JVD present.  Cardiovascular: Normal rate and regular rhythm. Exam reveals no gallop.  No murmur heard. Pulmonary/Chest: Effort normal and breath sounds normal. No respiratory distress. She has no wheezes. She has no rales.  Abdominal: Soft. She exhibits no mass.  Musculoskeletal:       Right lower leg: She exhibits edema (2+ pitting edema).       Left lower leg: She exhibits edema (2+ pitting edema 2+ pitting edema).  Neurological: She is alert.  Skin: No rash noted.  Psychiatric: She has a normal mood and affect.  Nursing note and vitals reviewed.    ED Treatments / Results  Labs (all labs ordered are listed, but only abnormal results are displayed) Labs Reviewed  CBC WITH DIFFERENTIAL/PLATELET - Abnormal; Notable for the following components:      Result Value   RBC 3.74 (*)    Hemoglobin 11.3 (*)    All other components within normal limits  BRAIN NATRIURETIC PEPTIDE - Abnormal; Notable for the following components:   B Natriuretic Peptide 544.8 (*)    All other components within normal limits  I-STAT CHEM 8, ED - Abnormal; Notable for the following components:   BUN 26 (*)    Glucose, Bld 133 (*)    All other components within normal limits  I-STAT TROPONIN, ED    EKG None  ED ECG REPORT   Date: 03/02/2018  Rate: 95  Rhythm: atrial  fibrillation and atrial flutter  QRS Axis: left  Intervals: QT prolonged  ST/T Wave abnormalities: nonspecific T wave changes  Conduction Disutrbances:none  Narrative Interpretation:   Old EKG Reviewed: unchanged  I have personally reviewed the  EKG tracing and agree with the computerized printout as noted.   Radiology Dg Chest 2 View  Result Date: 03/02/2018 CLINICAL DATA:  Shortness of breath for 2-3 weeks, worse today. Chest heaviness. EXAM: CHEST - 2 VIEW COMPARISON:  05/13/2017 FINDINGS: Moderate enlargement of the cardiac silhouette is similar to the prior study. There is mild central pulmonary vascular congestion without overt edema, segmental airspace consolidation, pleural effusion, or pneumothorax. No acute osseous abnormality is seen. IMPRESSION: Cardiomegaly and mild pulmonary vascular congestion. Electronically Signed   By: Logan Bores M.D.   On: 03/02/2018 14:52    Procedures Procedures (including critical care time)  Medications Ordered in ED Medications  nitroGLYCERIN 50 mg in dextrose 5 % 250 mL (0.2 mg/mL) infusion (45 mcg/min Intravenous Rate/Dose Change 03/02/18 1523)  furosemide (LASIX) injection 40 mg (40 mg Intravenous Given 03/02/18 1528)     Initial Impression / Assessment and Plan / ED Course  I have reviewed the triage vital signs and the nursing notes.  Pertinent labs & imaging results that were available during my care of the patient were reviewed by me and considered in my medical decision making (see chart for details).     BP (!) 179/106   Pulse 75   Temp (!) 97.4 F (36.3 C) (Oral)   Resp 15   SpO2 99%    Final Clinical Impressions(s) / ED Diagnoses   Final diagnoses:  Acute on chronic systolic congestive heart failure [facility])    ED Discharge Orders    None     1:44 PM Patient with history of CHF here for progressive worsening shortness of breath and leg swelling.  She has been compliant with her diuretic.  She was found to have an  elevated BNP of 715 at her PCP office.  At this time symptoms likely CHF exacerbation.  I have low suspicion for PE or ACS.  3:32 PM Chest x-ray demonstrate cardiomegaly with mild pulmonary vascular congestion.  Her BNP today is 544. EKG showing atrial flutter, which likely exacerbates her CHF.  Pt started in nitrodrips with titration.  Lasix 40mg  IV given.  Will consult for admission  Appreciate consultation from [facility] Dr. Lorin Mercy who agrees to see and admit pt.  Care discussed with Dr. Venora Maples.   CRITICAL CARE Performed by: Domenic Moras Total critical care time: 35 minutes Critical care time was exclusive of separately billable procedures and treating other patients. Critical care was necessary to treat or prevent imminent or life-threatening deterioration. Critical care was time spent personally by me on the following activities: development of treatment plan with patient and/or surrogate as well as nursing, discussions with consultants, evaluation of patient's response to treatment, examination of patient, obtaining history from patient or surrogate, ordering and performing treatments and interventions, ordering and review of laboratory studies, ordering and review of radiographic studies, pulse oximetry and re-evaluation of patient's condition.    Domenic Moras, PA-C 03/02/18 [place], MD 03/03/18 [phone]

## 2018-03-02 NOTE — ED Notes (Signed)
Patient transported to X-ray 

## 2018-03-02 NOTE — Progress Notes (Signed)
Gout still not controlled tell her to take her allopurionl  Need lasix 2 times daily need to see heart doc.  Also increase thyroud medicine

## 2018-03-03 ENCOUNTER — Telehealth: Payer: Self-pay | Admitting: Medical

## 2018-03-03 ENCOUNTER — Observation Stay (HOSPITAL_BASED_OUTPATIENT_CLINIC_OR_DEPARTMENT_OTHER): Payer: Medicare Other

## 2018-03-03 DIAGNOSIS — I340 Nonrheumatic mitral (valve) insufficiency: Secondary | ICD-10-CM | POA: Diagnosis not present

## 2018-03-03 DIAGNOSIS — I428 Other cardiomyopathies: Secondary | ICD-10-CM | POA: Diagnosis not present

## 2018-03-03 DIAGNOSIS — I4891 Unspecified atrial fibrillation: Secondary | ICD-10-CM

## 2018-03-03 DIAGNOSIS — I361 Nonrheumatic tricuspid (valve) insufficiency: Secondary | ICD-10-CM | POA: Diagnosis not present

## 2018-03-03 DIAGNOSIS — I5043 Acute on chronic combined systolic (congestive) and diastolic (congestive) heart failure: Secondary | ICD-10-CM | POA: Diagnosis not present

## 2018-03-03 DIAGNOSIS — I5023 Acute on chronic systolic (congestive) heart failure: Secondary | ICD-10-CM

## 2018-03-03 DIAGNOSIS — N183 Chronic kidney disease, stage 3 (moderate): Secondary | ICD-10-CM | POA: Diagnosis not present

## 2018-03-03 DIAGNOSIS — E1122 Type 2 diabetes mellitus with diabetic chronic kidney disease: Secondary | ICD-10-CM | POA: Diagnosis not present

## 2018-03-03 DIAGNOSIS — I130 Hypertensive heart and chronic kidney disease with heart failure and stage 1 through stage 4 chronic kidney disease, or unspecified chronic kidney disease: Secondary | ICD-10-CM | POA: Diagnosis not present

## 2018-03-03 LAB — CBC WITH DIFFERENTIAL/PLATELET
Abs Immature Granulocytes: 0 10*3/uL (ref 0.0–0.1)
BASOS PCT: 0 %
Basophils Absolute: 0 10*3/uL (ref 0.0–0.1)
EOS PCT: 1 %
Eosinophils Absolute: 0.1 10*3/uL (ref 0.0–0.7)
HEMATOCRIT: 34.9 % — AB (ref 36.0–46.0)
Hemoglobin: 11.1 g/dL — ABNORMAL LOW (ref 12.0–15.0)
Immature Granulocytes: 0 %
Lymphocytes Relative: 50 %
Lymphs Abs: 2.5 10*3/uL (ref 0.7–4.0)
MCH: 30.4 pg (ref 26.0–34.0)
MCHC: 31.8 g/dL (ref 30.0–36.0)
MCV: 95.6 fL (ref 78.0–100.0)
Monocytes Absolute: 0.5 10*3/uL (ref 0.1–1.0)
Monocytes Relative: 10 %
NEUTROS PCT: 39 %
Neutro Abs: 2 10*3/uL (ref 1.7–7.7)
PLATELETS: 166 10*3/uL (ref 150–400)
RBC: 3.65 MIL/uL — AB (ref 3.87–5.11)
RDW: 13.4 % (ref 11.5–15.5)
WBC: 5 10*3/uL (ref 4.0–10.5)

## 2018-03-03 LAB — VITAMIN D 1,25 DIHYDROXY
Vitamin D 1, 25 (OH)2 Total: 55 pg/mL (ref 18–72)
Vitamin D2 1, 25 (OH)2: <8 pg/mL
Vitamin D3 1, 25 (OH)2: 55 pg/mL

## 2018-03-03 LAB — BASIC METABOLIC PANEL
Anion gap: 9 (ref 5–15)
BUN: 20 mg/dL (ref 6–20)
CALCIUM: 9.4 mg/dL (ref 8.9–10.3)
CHLORIDE: 101 mmol/L (ref 101–111)
CO2: 31 mmol/L (ref 22–32)
CREATININE: 1.36 mg/dL — AB (ref 0.44–1.00)
GFR calc non Af Amer: 40 mL/min — ABNORMAL LOW (ref >60)
GFR, EST AFRICAN AMERICAN: 46 mL/min — AB (ref >60)
GLUCOSE: 122 mg/dL — AB (ref 65–99)
Potassium: 4 mmol/L (ref 3.5–5.1)
Sodium: 141 mmol/L (ref 135–145)

## 2018-03-03 LAB — LIPID PANEL
Cholesterol: 197 mg/dL (ref 0–200)
HDL: 53 mg/dL (ref >40)
LDL Cholesterol: 129 mg/dL — ABNORMAL HIGH (ref 0–99)
TRIGLYCERIDES: 76 mg/dL (ref <150)
Total CHOL/HDL Ratio: 3.7 RATIO
VLDL: 15 mg/dL (ref 0–40)

## 2018-03-03 LAB — PTH, INTACT AND CALCIUM
Calcium: 9.4 mg/dL (ref 8.6–10.4)
PTH: 229 pg/mL — ABNORMAL HIGH (ref 14–64)

## 2018-03-03 LAB — RHEUMATOID FACTOR: Rheumatoid fact SerPl-aCnc: <14 [IU]/mL

## 2018-03-03 LAB — GLUCOSE, CAPILLARY
GLUCOSE-CAPILLARY: 113 mg/dL — AB (ref 65–99)
Glucose-Capillary: 139 mg/dL — ABNORMAL HIGH (ref 65–99)
Glucose-Capillary: 156 mg/dL — ABNORMAL HIGH (ref 65–99)
Glucose-Capillary: 131 mg/dL — ABNORMAL HIGH (ref 65–99)

## 2018-03-03 LAB — CALCIUM, IONIZED: CALCIUM ION: 5.16 mg/dL (ref 4.8–5.6)

## 2018-03-03 LAB — TROPONIN I: Troponin I: <0.03 ng/mL (ref <0.03)

## 2018-03-03 LAB — ANA: Anti Nuclear Antibody(ANA): NEGATIVE

## 2018-03-03 LAB — CYCLIC CITRUL PEPTIDE ANTIBODY, IGG: Cyclic Citrullin Peptide Ab: <16 UNITS

## 2018-03-03 LAB — ANGIOTENSIN CONVERTING ENZYME: Angiotensin-Converting Enzyme: 18 U/L (ref 9–67)

## 2018-03-03 MED ORDER — ALLOPURINOL 300 MG PO TABS
300.0000 mg | ORAL_TABLET | Freq: Every day | ORAL | Status: DC
  Administered 2018-03-03 – 2018-03-04 (×2): 300 mg via ORAL
  Filled 2018-03-03 (×2): qty 1

## 2018-03-03 MED ORDER — PERFLUTREN LIPID MICROSPHERE
1.0000 mL | INTRAVENOUS | Status: AC | PRN
  Administered 2018-03-03: 2 mL via INTRAVENOUS
  Filled 2018-03-03: qty 10

## 2018-03-03 MED ORDER — LEVOTHYROXINE SODIUM 50 MCG PO TABS
50.0000 ug | ORAL_TABLET | Freq: Every day | ORAL | Status: DC
  Administered 2018-03-04: 50 ug via ORAL
  Filled 2018-03-03: qty 1

## 2018-03-03 MED ORDER — LIVING WELL WITH DIABETES BOOK
Freq: Once | Status: DC
  Filled 2018-03-03: qty 1

## 2018-03-03 MED ORDER — LOPERAMIDE HCL 2 MG PO CAPS
2.0000 mg | ORAL_CAPSULE | ORAL | Status: DC | PRN
  Administered 2018-03-03: 2 mg via ORAL
  Filled 2018-03-03: qty 1

## 2018-03-03 MED ORDER — PERFLUTREN LIPID MICROSPHERE
INTRAVENOUS | Status: AC
  Administered 2018-03-03: 2 mL via INTRAVENOUS
  Filled 2018-03-03: qty 10

## 2018-03-03 MED ORDER — SPIRONOLACTONE 25 MG PO TABS
25.0000 mg | ORAL_TABLET | Freq: Every day | ORAL | Status: DC
  Administered 2018-03-04: 25 mg via ORAL
  Filled 2018-03-03: qty 1

## 2018-03-03 NOTE — Discharge Instructions (Addendum)
Information on my medicine - Pradaxa (dabigatran)  Why was Pradaxa prescribed for you? Pradaxa was prescribed for you to reduce the risk of forming blood clots that cause a stroke if you have a medical condition called atrial fibrillation (a type of irregular heartbeat).    What do you Need to know about PradAXa? Take your Pradaxa TWICE DAILY - one capsule in the morning and one tablet in the evening with or without food.  It would be best to take the doses about the same time each day.  The capsules should not be broken, chewed or opened - they must be swallowed whole.  Do not store Pradaxa in other medication containers - once the bottle is opened the Pradaxa should be used within FOUR months; throw away any capsules that havent been by that time.  Take Pradaxa exactly as prescribed by your doctor.  DO NOT stop taking Pradaxa without talking to the doctor who prescribed the medication.  Stopping without other stroke prevention medication to take the place of Pradaxa may increase your risk of developing a clot that causes a stroke.  Refill your prescription before you run out.  After discharge, you should have regular check-up appointments with your healthcare provider that is prescribing your Pradaxa.  In the future your dose may need to be changed if your kidney function or weight changes by a significant amount.  What do you do if you miss a dose? If you miss a dose, take it as soon as you remember on the same day.  If your next dose is less than 6 hours away, skip the missed dose.  Do not take two doses of PRADAXA at the same time.  Important Safety Information A possible side effect of Pradaxa is bleeding. You should call your healthcare provider right away if you experience any of the following: ? Bleeding from an injury or your nose that does not stop. ? Unusual colored urine (red or dark brown) or unusual colored stools (red or black). ? Unusual bruising for unknown  reasons. ? A serious fall or if you hit your head (even if there is no bleeding).  Some medicines may interact with Pradaxa and might increase your risk of bleeding or clotting while on Pradaxa. To help avoid this, consult your healthcare provider or pharmacist prior to using any new prescription or non-prescription medications, including herbals, vitamins, non-steroidal anti-inflammatory drugs (NSAIDs) and supplements.  This website has more information on Pradaxa (dabigatran): https://www.pradaxa.com   Insulin Injection Instructions, Using Insulin Pens, Adult  A subcutaneous injection is a shot of medicine that is injected into the layer of fat between skin and muscle. People with type 1 diabetes must take insulin because their bodies do not make it. People with type 2 diabetes may need to take insulin. There are many different types of insulin. The type of insulin that you take may determine how many injections you give yourself and when you need to take the injections. Choosing a site for injection Insulin absorption varies from site to site. It is best to inject insulin within the same body area, using a different spot in that area for each injection. Do not inject the insulin in the same spot for each injection. There are five main areas that can be used for injecting. These areas include:  Abdomen. This is the preferred area.  Front of thigh.  Upper, outer side of thigh.  Back of upper arm.  Buttocks.  Using an insulin pen First, follow  the steps for Getting Ready, then continue with the steps for Injecting the Insulin. Getting Ready 1. Wash your hands with soap and water. If soap and water are not available, use hand sanitizer. 2. Check the expiration date and type of insulin in the pen. 3. If you are using CLEAR insulin, check to see that it is clear and free of clumps. 4. If you are using CLOUDY insulin, gently roll the pen between your palms several times, or tip the pen  up and down several times to mix up the medicine. Do not shake the pen. 5. Remove the cap from the insulin pen. 6. Use an alcohol wipe to clean the rubber stopper of the pen cartridge. 7. Remove the protective paper tab from the disposable needle. Do not let the needle touch anything. 8. Screw the needle onto the pen. 9. Remove the outer and inner plastic covers from the needle. Do not throw away the outer plastic cover yet. 10. Prime the insulin pen by turning the button (dial) to 2 units. Hold the pen with the needle pointing up, and push the button on the opposite end of the pen until a drop of insulin appears at the needle tip. If no insulin appears, repeat this step. 11. Dial the number of units of insulin that you will be injecting. Injecting the Insulin    1. Use an alcohol wipe to clean the site where you will be injecting the needle. Let the site air-dry. 2. Hold the pen in the palm of your writing hand with your thumb on the top. 3. If directed by your health care provider, use your other hand to pinch and hold about an inch of skin at the injection site. Do not directly touch the cleaned part of the skin. 4. Gently but quickly, put the needle straight into the skin. The needle should be at a 90-degree angle (perpendicular) to the skin, as if to form the letter "L." ? For example, if you are giving an injection in the abdomen, the abdomen forms one "leg" of the "L" and the needle forms the other "leg" of the "L." 5. For adults who have a small amount of body fat, the needle may need to be injected at a 45-degree angle instead. Your health care provider will tell you if this is necessary. ? A 45-degree angle looks like the letter "V." 6. When the needle is completely inserted into the skin, use the thumb of your writing hand to push the top button of the pen down all the way to inject the insulin. 7. Let go of the skin that you are pinching. Continue to hold the pen in place with your  writing hand. 8. Wait five seconds, then pull the needle straight out of the skin. 9. Carefully put the larger (outer) plastic cover of the needle back over the needle, then unscrew the capped needle and discard it in a sharps container, such as an empty plastic bottle with a cover. 10. Put the plastic cap back on the insulin pen. Throwing away supplies  Discard all used needles in a puncture-proof sharps disposal container. You can ask your local pharmacy about where you can get this kind of disposal container, or you can use an empty liquid laundry detergent bottle that has a cover.  Follow the disposal regulations for the area where you live. Do not use any needle more than one time.  Throw away empty disposable pens in the regular trash. What questions should  I ask my health care provider?  How often should I be taking insulin?  How often should I check my blood glucose?  What amount of insulin should I be taking at each time?  What are the side effects?  What should I do if my blood glucose is too high?  What should I do if my blood glucose is too low?  What should I do if I forget to take my insulin?  What number should I call if I have questions? Where can I get more information?  American Diabetes Association (ADA): www.diabetes.org  American Association of Diabetes Educators (AADE) Patient Resources: https://www.diabeteseducator.org/patient-resources This information is not intended to replace advice given to you by your health care provider. Make sure you discuss any questions you have with your health care provider. Document Released: 11/08/2015 Document Revised: 03/12/2016 Document Reviewed: 11/08/2015 Elsevier Interactive Patient Education  [employer].

## 2018-03-03 NOTE — Plan of Care (Signed)
  Problem: Education: Goal: Knowledge of General Education information will improve Outcome: Progressing   Problem: Health Behavior/Discharge Planning: Goal: Ability to manage health-related needs will improve Outcome: Progressing   Problem: Clinical Measurements: Goal: Ability to maintain clinical measurements within normal limits will improve Outcome: Progressing Goal: Will remain free from infection Outcome: Progressing Goal: Diagnostic test results will improve Outcome: Progressing Goal: Respiratory complications will improve Outcome: Progressing Goal: Cardiovascular complication will be avoided Outcome: Progressing   Problem: Activity: Goal: Risk for activity intolerance will decrease Outcome: Progressing   Problem: Nutrition: Goal: Adequate nutrition will be maintained Outcome: Progressing   Problem: Coping: Goal: Level of anxiety will decrease Outcome: Progressing   Problem: Elimination: Goal: Will not experience complications related to bowel motility Outcome: Progressing Goal: Will not experience complications related to urinary retention Outcome: Progressing   Problem: Pain Managment: Goal: General experience of comfort will improve Outcome: Progressing denies pain   Problem: Safety: Goal: Ability to remain free from injury will improve Outcome: Progressing  Up with distress refused to walk in hall Problem: Skin Integrity: Goal: Risk for impaired skin integrity will decrease Outcome: Progressing   Problem: Education: Goal: Ability to demonstrate management of disease process will improve Outcome: Progressing Goal: Ability to verbalize understanding of medication therapies will improve Outcome: Progressing   Problem: Activity: Goal: Capacity to carry out activities will improve Outcome: Progressing   Problem: Cardiac: Goal: Ability to achieve and maintain adequate cardiopulmonary perfusion will improve Outcome: Progressing   Problem: Skin  Integrity: Goal: Risk for impaired skin integrity will decrease Outcome: Progressing

## 2018-03-03 NOTE — Evaluation (Signed)
Physical Therapy Evaluation and Discharge Patient Details Name: Stephanie [name] MRN: [id number] DOB: Oct 19, 1951 Today's Date: 03/03/2018   History of Present Illness  Patient is a 67 year old female with past medical history of sarcoidosis, atrial fibrillation on Pradaxa, hypertension, disease, chronic diastolic CHF who presents to the emergency department with complaints of shortness of breath, lower extremity edema.  Patient was admitted for the management of acute on chronic CHF.  She has been started on IV diuresis.  Cardiology consulted.    Clinical Impression  Pt admitted with above diagnosis. Pt currently with functional limitations due to the deficits listed below (see PT Problem List). PTA, pt living with son, independent with all mobility. Upon eval pt presents close to baseline, is no longer having difficulty walking or SOB with activity. Ambulated the unit and stairs with supervision and no dyspnea or chest pain. SpO2 96% on RA with activity. Discussed importance of compliance and exercise after d/c.  Pt will benefit from skilled PT to increase their independence and safety with mobility to allow discharge to the venue listed below.       Follow Up Recommendations No PT follow up    Equipment Recommendations  None recommended by PT    Recommendations for Other Services       Precautions / Restrictions Precautions Precautions: None Restrictions Weight Bearing Restrictions: No      Mobility  Bed Mobility Overal bed mobility: Modified Independent                Transfers Overall transfer level: Modified independent Equipment used: None                Ambulation/Gait Ambulation/Gait assistance: Modified independent (Device/Increase time) Ambulation Distance (Feet): 400 Feet Assistive device: None Gait Pattern/deviations: Step-through pattern Gait velocity: normal   General Gait Details: patient ambulating with normal pace, no unsteadiness, SpO2 >96% on  RA  Stairs Stairs: Yes Stairs assistance: Supervision Stair Management: One rail Right;Step to pattern;Forwards Number of Stairs: 6 General stair comments: ambulating stairs with difficulty, no LOB.   Wheelchair Mobility    Modified Rankin (Stroke Patients Only)       Balance Overall balance assessment: No apparent balance deficits (not formally assessed)                                           Pertinent Vitals/Pain Pain Assessment: No/denies pain    Home Living Family/patient expects to be discharged to:: Private residence Living Arrangements: Children Available Help at Discharge: Family;Available PRN/intermittently Type of Home: House Home Access: Stairs to enter Entrance Stairs-Rails: Can reach both   Home Layout: One level Home Equipment: None      Prior Function Level of Independence: Independent         Comments: going to gym to walk, indepndent with ADLs     Hand Dominance        Extremity/Trunk Assessment   Upper Extremity Assessment Upper Extremity Assessment: Overall WFL for tasks assessed    Lower Extremity Assessment Lower Extremity Assessment: Overall WFL for tasks assessed       Communication   Communication: No difficulties  Cognition Arousal/Alertness: Awake/alert Behavior During Therapy: WFL for tasks assessed/performed Overall Cognitive Status: Within Functional Limits for tasks assessed  General Comments      Exercises     Assessment/Plan    PT Assessment Patent does not need any further PT services  PT Problem List         PT Treatment Interventions      PT Goals (Current goals can be found in the Care Plan section)  Acute Rehab PT Goals Patient Stated Goal: go home PT Goal Formulation: With patient Time For Goal Achievement: 03/10/18 Potential to Achieve Goals: Good    Frequency     Barriers to discharge        Co-evaluation                AM-PAC PT "6 Clicks" Daily Activity  Outcome Measure Difficulty turning over in bed (including adjusting bedclothes, sheets and blankets)?: None Difficulty moving from lying on back to sitting on the side of the bed? : None Difficulty sitting down on and standing up from a chair with arms (e.g., wheelchair, bedside commode, etc,.)?: None Help needed moving to and from a bed to chair (including a wheelchair)?: None Help needed walking in hospital room?: None Help needed climbing 3-5 steps with a railing? : A Little 6 Click Score: 23    End of Session Equipment Utilized During Treatment: Gait belt Activity Tolerance: Patient tolerated treatment well Patient left: with call bell/phone within reach;in chair Nurse Communication: Mobility status PT Visit Diagnosis: Difficulty in walking, not elsewhere classified (R26.2)    Time: 4193-7902 PT Time Calculation (min) (ACUTE ONLY): 25 min   Charges:   PT Evaluation $PT Eval Low Complexity: 1 Low     PT G Codes:        Reinaldo Berber, PT, DPT Acute Rehab Services Pager: [phone]   Reinaldo Berber 03/03/2018, 3:38 PM

## 2018-03-03 NOTE — Telephone Encounter (Signed)
Called and informed pt that form was ready to be picked up and made copy for [place]

## 2018-03-03 NOTE — Care Management Obs Status (Signed)
[place] NOTIFICATION   Patient Details  Name: Stephanie [name] MRN: [id number] Date of Birth: 1951/08/08   Medicare Observation Status Notification Given:  Yes    Erenest Rasher, RN 03/03/2018, 3:34 PM

## 2018-03-03 NOTE — Progress Notes (Signed)
PROGRESS NOTE    Stephanie [name]  TDD:[id number] DOB: 08-18-1951 DOA: 03/02/2018 PCP: Carlena Hurl, PA-C   Brief Narrative: Patient is a 67 year old female with past medical history of sarcoidosis, atrial fibrillation on Pradaxa, hypertension, disease, chronic diastolic CHF who presents to the emergency department with complaints of shortness of breath, lower extremity edema.  Patient was admitted for the management of acute on chronic CHF.  She has been started on IV diuresis.  Cardiology consulted.  Assessment & Plan:   Principal Problem:   Acute on chronic systolic CHF (congestive heart failure) (HCC) Active Problems:   Essential hypertension   Atrial fibrillation (HCC)   Dyslipidemia   Type 2 diabetes mellitus with complication, without long-term current use of insulin (HCC)   Noncompliance   Acute on chronic CHF: Patient presented with multiple complaints, she was dyspneic and had worsening of her lower extremity edema. Chest x-ray on admission showed cardiomegaly with mild pulmonary vascular congestion.  Elevated BNP.  Reportedly patient was not taking her heart failure medications.  She follows with cardiology as an outpatient.  Echocardiogram has been ordered which will follow. She has been started on IV diuresis which will continue.  Continue her other home medications.  She is on Entresto,Coreg. We will continue to monitor her input/output, daily weight.  She has been counseled on the importance of fluid restriction and salt intake. Patient was found to be comfortable this morning.    History of atrial fibrillation:  She is on Pradaxa.  Rate is controlled.  Acute kidney injury: Creatinine found to be elevated this morning.  We will continue to monitor.  Continue diuresis for now.  History of hypertension: Found to be hypertensive on presentation.  Currently blood pressure stable.  Continue Coreg and Entresto.  Hyperlipidemia: Continue Crestor.  LDL 129  Diabetes  mellitus: Continue sliding scale insulin here.  Will check hemoglobin A1c.    DVT prophylaxis: Pradaxa Code Status: Full Family Communication: None present at the bedside Disposition Plan: Home likely after resolution of acute CHF exacerbation   Consultants: Cardiology  Procedures:Echo pending  Antimicrobials:None  Subjective: Patient seen and examined at bedside this morning.  Remains comfortable.  Respiratory status is stable.  She has still severe bilateral lower extremity edema.  Lungs are clear on examination today.  Objective: Vitals:   03/03/18 0041 03/03/18 0043 03/03/18 0425 03/03/18 1141  BP: (!) 149/100 (!) 138/92 (!) 127/91 135/88  Pulse: (!) 57 76 85 77  Resp: 18  20 18   Temp: 98 F (36.7 C)   97.8 F (36.6 C)  TempSrc: Oral   Oral  SpO2: 100%  98% 98%  Weight:      Height:        Intake/Output Summary (Last 24 hours) at 03/03/2018 1312 Last data filed at 03/03/2018 0300 Gross per 24 hour  Intake 123 ml  Output 1320 ml  Net -1197 ml   Filed Weights   03/02/18 1658  Weight: 122.7 kg (270 lb 8 oz)    Examination:  General exam: Appears calm and comfortable ,Not in distress, morbidly obese HEENT:PERRL,Oral mucosa moist, Ear/Nose normal on gross exam Respiratory system: Bilateral equal air entry, normal vesicular breath sounds, no wheezes or crackles  Cardiovascular system: Irregularly irregular. No JVD, murmurs, rubs, gallops or clicks. No pedal edema. Gastrointestinal system: Abdomen is nondistended, soft and nontender. No organomegaly or masses felt. Normal bowel sounds heard. Central nervous system: Alert and oriented. No focal neurological deficits. Extremities: 2-3+ edema on lower extremities, no  clubbing ,no cyanosis, distal peripheral pulses palpable. Skin: No rashes, lesions or ulcers,no icterus ,no pallor MSK: Normal muscle bulk,tone ,power Psychiatry: Judgement and insight appear normal. Mood & affect appropriate.     Data Reviewed: I  have personally reviewed following labs and imaging studies  CBC: Recent Labs  Lab 03/01/18 1130 03/02/18 1331 03/02/18 1412 03/03/18 0255  WBC 5.8 5.5  --  5.0  NEUTROABS 3.1 2.7  --  2.0  HGB 11.6* 11.3* 12.2 11.1*  HCT 34.9* 36.3 36.0 34.9*  MCV 95.6 97.1  --  95.6  PLT 172.0 173  --  109   Basic Metabolic Panel: Recent Labs  Lab 03/01/18 1130 03/02/18 1412 03/03/18 0255  NA 142 143 141  K 4.0 4.4 4.0  CL 108 108 101  CO2 26  --  31  GLUCOSE 138* 133* 122*  BUN 32* 26* 20  CREATININE 1.25* 1.00 1.36*  CALCIUM 9.4  9.3  --  9.4   GFR: Estimated Creatinine Clearance: 52 mL/min (A) (by C-G formula based on SCr of 1.36 mg/dL (H)). Liver Function Tests: Recent Labs  Lab 03/01/18 1130  AST 13  ALT 15  ALKPHOS 58  BILITOT 0.4  PROT 7.4  ALBUMIN 3.9   No results for input(s): LIPASE, AMYLASE in the last 168 hours. No results for input(s): AMMONIA in the last 168 hours. Coagulation Profile: No results for input(s): INR, PROTIME in the last 168 hours. Cardiac Enzymes: Recent Labs  Lab 03/02/18 1920 03/03/18 0255  TROPONINI <0.03 <0.03   BNP (last 3 results) Recent Labs    03/01/18 1130  PROBNP 715.0*   HbA1C: No results for input(s): HGBA1C in the last 72 hours. CBG: Recent Labs  Lab 03/02/18 1710 03/02/18 2156 03/03/18 0741 03/03/18 1138  GLUCAP 124* 176* 139* 156*   Lipid Profile: Recent Labs    03/03/18 0255  CHOL 197  HDL 53  LDLCALC 129*  TRIG 76  CHOLHDL 3.7   Thyroid Function Tests: Recent Labs    03/01/18 1130  TSH 6.97*   Anemia Panel: Recent Labs    03/01/18 1130  IRON 65   Sepsis Labs: No results for input(s): PROCALCITON, LATICACIDVEN in the last 168 hours.  No results found for this or any previous visit (from the past 240 hour(s)).       Radiology Studies: Dg Chest 2 View  Result Date: 03/02/2018 CLINICAL DATA:  Shortness of breath for 2-3 weeks, worse today. Chest heaviness. EXAM: CHEST - 2 VIEW  COMPARISON:  05/13/2017 FINDINGS: Moderate enlargement of the cardiac silhouette is similar to the prior study. There is mild central pulmonary vascular congestion without overt edema, segmental airspace consolidation, pleural effusion, or pneumothorax. No acute osseous abnormality is seen. IMPRESSION: Cardiomegaly and mild pulmonary vascular congestion. Electronically Signed   By: Logan Bores M.D.   On: 03/02/2018 14:52        Scheduled Meds: . aspirin EC  81 mg Oral Daily  . carvedilol  25 mg Oral BID WC  . dabigatran  150 mg Oral BID  . furosemide  40 mg Intravenous Q12H  . insulin aspart  0-15 Units Subcutaneous TID WC  . insulin aspart  0-5 Units Subcutaneous QHS  . insulin glargine  15 Units Subcutaneous QHS  . rosuvastatin  20 mg Oral q1800  . sacubitril-valsartan  1 tablet Oral BID  . sodium chloride flush  3 mL Intravenous Q12H   Continuous Infusions: . sodium chloride       LOS:  0 days    Time spent: 35 mins.More than 50% of that time was spent in counseling and/or coordination of care.      Shelly Coss, MD [facility] Pager [phone]  If 7PM-7AM, please contact night-coverage [email or web address] Password [email or web address] 03/03/2018, 1:12 PM

## 2018-03-03 NOTE — Progress Notes (Signed)
Spoke with patient about her diabetes.  Was diagnosed about 3-4 years ago. Last physician following her diabetes is Dr. Glade Lloyd. She had been on Metformin recently. States that she was given a prescription for insulin pens, but was not given instruction on how to use. The son picked up the prescription, so the patient was not available for the pharmacist to show her how to use. Patient did not know how much insulin to take either. Patient is very confused about all her medications.  Showed the patient how to use an insulin pen by giving demonstration of a demo pen. Patient seemed to follow instructions fairly well, but will need to follow up with patient if she is discharged on insulin. She will need very detailed instructions on dosage and use if being discharged on insulin. An insulin starter kit for insulin pens can be ordered if needed prior to discharge. Will order Living Well with Diabetes booklet for her to read.   Will follow up with patient tomorrow.  Harvel Ricks RN BSN CDE Diabetes Coordinator Pager: [phone]  8am-5pm

## 2018-03-03 NOTE — Consult Note (Addendum)
Cardiology Consult    Patient ID: Stephanie [name] MRN: [id number], DOB/AGE: [birth year]   Admit date: 03/02/2018 Date of Consult: 03/03/2018  Primary Physician: Carlena Hurl, PA-C Primary Cardiologist: Dr. [name] Requesting Provider: Dr. Tawanna Solo Reason for Consultation: CHF  Stephanie [name] is a 67 y.o. female who is being seen today for the evaluation of CHF at the request of Dr. Tawanna Solo.   Patient Profile    67 yo female with PMH of PAF, sarcoidosis, IBS, HL, nonobstructive CAD (cath 2010) and recurrent NICM felt to be 2/2 to tachycardia/AF who presented with shortness of breath as well as other complaints.   Past Medical History   Past Medical History:  Diagnosis Date  . Anemia   . Ankle fracture, right    x 2  . Antineoplastic and immunosuppressive drugs causing adverse effect in therapeutic use   . Asthma   . Chronic venous hypertension without complications   . Colon polyps 2009  . Coronary artery disease    no CAD by cath 11.2010  . Depressive disorder, not elsewhere classified   . Diabetes mellitus type 2 in obese (HCC)    hgb AIC 6.1 09/02/2009  . Diastolic CHF, chronic ([place])   . Diverticulosis of colon 2009   colonoscopy  . Gout   . Hepatitis, unspecified    hx of  . Hx of hysterectomy 2002  . Hyperlipidemia   . Hypertension   . Hypothyroidism   . Irritable bowel syndrome   . Lung nodule    60mm RLL nodule on CT Chest  07/2009, resolved by 2011 CT  . Noncompliance   . Obesity   . Persistent atrial fibrillation ([place])    s/p DCCV 07/2009; Pradaxa Rx  . PONV (postoperative nausea and vomiting)   . Sarcoidosis    dx by eye exam  . Sleep disturbance 06/2013   sleep study, mild abnormality, no criteria for CPAP    Past Surgical History:  Procedure Laterality Date  . ABDOMINAL HYSTERECTOMY    . CARDIOVERSION     x 2  . CARDIOVERSION N/A 02/27/2013   Procedure: CARDIOVERSION;  Surgeon: Lelon Perla, MD;  Location: [facility] ENDOSCOPY;  Service:  Cardiovascular;  Laterality: N/A;  . CARDIOVERSION N/A 03/24/2017   Procedure: CARDIOVERSION;  Surgeon: Josue Hector, MD;  Location: [facility] ENDOSCOPY;  Service: Cardiovascular;  Laterality: N/A;  . CHOLECYSTECTOMY    . COLONOSCOPY     Diverticulosis, colon polyps, repeat 2014, Dr. Deatra Ina  . ESOPHAGOGASTRODUODENOSCOPY N/A 05/16/2014   Procedure: ESOPHAGOGASTRODUODENOSCOPY (EGD);  Surgeon: Wonda Horner, MD;  Location: [facility] ORS;  Service: Gastroenterology;  Laterality: N/A;  . HERNIA REPAIR  2009     Allergies  Allergies  Allergen Reactions  . Amiodarone Other (See Comments)    adverse reaction: Tremor/gait disurbance  . Atorvastatin Other (See Comments)    Body aches and pains--stiffness.   [name] [Pitavastatin]     Body aches  . Propoxyphene N-Acetaminophen Nausea And Vomiting  . Albuterol Palpitations    Palpitations, intolerance    History of Present Illness    Stephanie [name] is a 67 yo female with PMH of  PAF, sarcoidosis, IBS, HL, nonobstructive CAD (cath 2010) and recurrent NICM felt to be 2/2 to tachycardia/AF. She has been followed by Dr. [name] as an outpatient. On Pradaxa 150mg  BID for [place]. Noted to have issues with noncompliance with her medications in the past. Had a cardiac catheterization back in 2012 demonstrating nonobstructive CAD and normal LV function. Echo in  4/14 showed decline in EF to 25-30%. She underwent DCCV with restoration of SR. Was then seen in 3/15 by Dr. Rayann Heman to consider for ablation but she was not interested at that time. She later developed hyperthyroidism on amiodarone and ws discontinued. Last echo 12/17 showed EF of 25% with diffuse hypokinesis.   Last seen in the office on 7/18 by Dr. [name]. Noted to be in Afib with elevated rates. Her metoprolol was increased. Also switched from losartan to [facility]. Options for dofetilide and ablation were discussed again at this visit.   Since this las visit she has seen several different doctors. States she has been  Rx over 25 different medications from each MD and was becoming confused about which medications to take at home. Over the past week states she had stopped taking her lasix, along with several other medications because she was afraid she was taking to much. Has been seeing Chana Bode PA recently for PCP. Developed increased LE edema, and shortness of breath. Eventually called her son and presented to the ED with symptoms.   In the ED her labs showed stable electrolytes, BNP 544, Hgb 11.1. CXR with vascular congestion. EKG showed rate controlled Afib. She was started on IV lasix and admitted for further work up.   Inpatient Medications    . aspirin EC  81 mg Oral Daily  . carvedilol  25 mg Oral BID WC  . dabigatran  150 mg Oral BID  . furosemide  40 mg Intravenous Q12H  . insulin aspart  0-15 Units Subcutaneous TID WC  . insulin aspart  0-5 Units Subcutaneous QHS  . insulin glargine  15 Units Subcutaneous QHS  . rosuvastatin  20 mg Oral q1800  . sacubitril-valsartan  1 tablet Oral BID  . sodium chloride flush  3 mL Intravenous Q12H    Family History    Family History  Problem Relation Age of Onset  . Pneumonia Father        deceased  . Hypertension Father   . Cancer Father   . Multiple sclerosis Mother        hx  . Emphysema Other        runs in the family    Social History    Social History   Socioeconomic History  . Marital status: Single    Spouse name: Not on file  . Number of children: 5  . Years of education: college  . Highest education level: Not on file  Occupational History  . Occupation: Disabled.  Social Needs  . Financial resource strain: Not on file  . Food insecurity:    Worry: Not on file    Inability: Not on file  . Transportation needs:    Medical: Not on file    Non-medical: Not on file  Tobacco Use  . Smoking status: Never Smoker  . Smokeless tobacco: Never Used  Substance and Sexual Activity  . Alcohol use: No    Alcohol/week: 0.0 oz  .  Drug use: No  . Sexual activity: Not Currently    Birth control/protection: Surgical  Lifestyle  . Physical activity:    Days per week: Not on file    Minutes per session: Not on file  . Stress: Not on file  Relationships  . Social connections:    Talks on phone: Not on file    Gets together: Not on file    Attends religious service: Not on file    Active member of club or organization: Not  on file    Attends meetings of clubs or organizations: Not on file    Relationship status: Not on file  . Intimate partner violence:    Fear of current or ex partner: Not on file    Emotionally abused: Not on file    Physically abused: Not on file    Forced sexual activity: Not on file  Other Topics Concern  . Not on file  Social History Narrative   Pt lives in [place] with her son.  Unemployed.  Right handed.     Education college     Review of Systems    See HPI  All other systems reviewed and are otherwise negative except as noted above.  Physical Exam    Blood pressure 135/88, pulse 77, temperature 97.8 F (36.6 C), temperature source Oral, resp. rate 18, height 5' 2.5" (1.588 m), weight 270 lb 8 oz (122.7 kg), SpO2 98 %.  General: AAF, NAD Psych: Normal affect. Neuro: Alert and oriented X 3. Moves all extremities spontaneously. HEENT: Normal  Neck: Supple, + JVD. Lungs:  Resp regular and unlabored, CTA. Heart: Irreg no s3, s4, or murmurs. Abdomen: Soft, non-tender, non-distended, BS + x 4.  Extremities: No clubbing, cyanosis 2+ LE edema. DP/PT/Radials 2+ and equal bilaterally.  Labs    Troponin [facility] of Care Test) Recent Labs    03/02/18 1411  TROPIPOC 0.01   Recent Labs    03/02/18 1920 03/03/18 0255  TROPONINI <0.03 <0.03   Lab Results  Component Value Date   WBC 5.0 03/03/2018   HGB 11.1 (L) 03/03/2018   HCT 34.9 (L) 03/03/2018   MCV 95.6 03/03/2018   PLT 166 03/03/2018    Recent Labs  Lab 03/01/18 1130  03/03/18 0255  NA 142   < > 141  K 4.0    < > 4.0  CL 108   < > 101  CO2 26  --  31  BUN 32*   < > 20  CREATININE 1.25*   < > 1.36*  CALCIUM 9.4  9.3  --  9.4  PROT 7.4  --   --   BILITOT 0.4  --   --   ALKPHOS 58  --   --   ALT 15  --   --   AST 13  --   --   GLUCOSE 138*   < > 122*   < > = values in this interval not displayed.   Lab Results  Component Value Date   CHOL 197 03/03/2018   HDL 53 03/03/2018   LDLCALC 129 (H) 03/03/2018   TRIG 76 03/03/2018   Lab Results  Component Value Date   DDIMER 0.46 05/16/2014     Radiology Studies    Dg Chest 2 View  Result Date: 03/02/2018 CLINICAL DATA:  Shortness of breath for 2-3 weeks, worse today. Chest heaviness. EXAM: CHEST - 2 VIEW COMPARISON:  05/13/2017 FINDINGS: Moderate enlargement of the cardiac silhouette is similar to the prior study. There is mild central pulmonary vascular congestion without overt edema, segmental airspace consolidation, pleural effusion, or pneumothorax. No acute osseous abnormality is seen. IMPRESSION: Cardiomegaly and mild pulmonary vascular congestion. Electronically Signed   By: Logan Bores M.D.   On: 03/02/2018 14:52    ECG & Cardiac Imaging    EKG:  The EKG was personally reviewed and demonstrates Afib  Echo: 10/18/16  Study Conclusions  - Left ventricle: The cavity size was mildly dilated. There was   moderate concentric  hypertrophy. Systolic function was severely   reduced. The estimated ejection fraction was 25%. Diffuse   hypokinesis. The study is not technically sufficient to allow   evaluation of LV diastolic function. - Mitral valve: Calcified annulus. Mildly thickened leaflets .   There was mild regurgitation. - Left atrium: The atrium was severely dilated. - Right ventricle: Systolic function was moderately reduced. - Right atrium: The atrium was mildly dilated. - Tricuspid valve: There was mild regurgitation.  Assessment & Plan    67 yo female with PMH of PAF, sarcoidosis, IBS, HL, nonobstructive CAD (cath  2010) and recurrent NICM felt to be 2/2 to tachycardia/AF who presented with shortness of breath as well as other complaints.   1. Acute on Chronic systolic HF: Known EF of 35%, last cath in 2010 with nonobstructive CAD. She has been seeing multiple MDs and reported having over 25 different Rx at home. Hx of issues with noncompliance in the past. Became confused and stopped taking several of her medications including her diuretic. Became short of breath and worsening LE edema over the past week as a result. BNP >500 and CXR with edema. Currently on 40mg  IV lasix BID with 1.1L UOP thus far. Breathing and LE edema have improved. Did have a rise in Cr to 1.3 with diuresis.  -- would continue with IV lasix for now  -- on Coreg and Entresto, follow Cr -- would probably benefit from [facility] to help with medication regimen -- echo pending  2. PAF: Hx of the same. Has been converted in the past, and had discussion about ablation but states she does not remember this. Has been on Pradaxa for [place]. Reports being complaint but again noted stopping several medications over the past week. Notes indicate the she has been on metoprolol and coreg in the past? Suspect she would not benefit from repeat cardioversion as her LA was severely dilated/ RA mildly dilated on last echo.  -- currently on coreg, and pradaxa. Currently on ASA, will DC.   3: HL: seems she had stopped taking her statin 2/2 myalgias. LDL 129 this admission. Has been resumed on Crestor 20mg .  4. HTN: blood pressures were uncontrolled on admission, but now improving.   Barnet Pall, NP-C Pager [phone] 03/03/2018, 12:27 PM   As above, patient seen and examined.  Briefly she is a 67 year old female with past medical history of permanent atrial fibrillation, sarcoidosis, irritable bowel syndrome, nonischemic cardiomyopathy, chronic systolic congestive heart failure for evaluation of acute on chronic systolic congestive heart failure.   Patient states that over the past 2 weeks she has gained 20 pounds.  She noticed increasing bilateral lower extremity edema.  She had dyspnea on exertion and orthopnea.  Some chest tightness with lying flat and deep inspiration improved with sitting up.  No palpitations or syncope.  No bleeding.  She has not taken her Lasix in over 1 week.  She was admitted and cardiology asked to evaluate.  Chest x-ray shows pulmonary vascular congestion.  Creatinine 1.36.  Troponins normal.  Hemoglobin 11.1.  Electrocardiogram shows atrial fibrillation, left ventricular hypertrophy, lateral T wave inversion.  TSH 6.97.  1 acute on chronic systolic congestive heart failure-patient symptoms are improving with diuresis.  Continue Lasix 60 mg IV twice daily.  Add Spironolactone 25 mg daily.  Follow potassium and renal function.  Patient instructed on importance of low-sodium diet and fluid restriction.  2 nonischemic cardiomyopathy-plan to continue carvedilol and Entresto.  Titrate medications as tolerated by pulse and blood  pressure.  Repeat echocardiogram.  Note cardiomyopathy is felt possibly secondary to tachycardia.  Would plan outpatient 48-hour Holter monitor after discharge.  Could add digoxin for improved rate control if rate is elevated.  3 permanent atrial fibrillation-there has been discussion concerning trying a different antiarrhythmic with repeat cardioversion.  However best option likely is rate control and anticoagulation.  Continue Pradaxa. CHADSvasc 5.   4 noncompliance-patient instructed on importance of taking medications.  5 chronic stage III kidney disease-follow renal function closely with diuresis.  6 hypertension-blood pressure is controlled.  Continue present medications.  Kirk Ruths, MD

## 2018-03-03 NOTE — Consult Note (Signed)
   [facility] CM Inpatient Consult   03/03/2018  Earma Nicolaou January 16, 1951 [id number]  Thank you for this consult.  Met with the patient at the bedside to discuss [place] Management. Patient states she had medications for diabetes that she has been confused about and did not feel she had instructions about insulin and medications.  Explained [facility] Care Management for post hospital care management and disease management services. She states she would like to look over the brochure and she states she wouldn't mind telephonic follow up at this time.  States, "Once they get all of this fluid off of me I am planning to attend graduations and events I have been invited too. So, I don't think home visits will work out at this time.  I had [facility] in the past."  Explained the differences between home health and community care management.  Patient states she will review as brochure, 24 hour nurse advise line and contact information.  Will follow up.  For questions, please contact:  Natividad Brood, RN BSN [facility] Liaison  [phone] business mobile phone Toll free office [phone]

## 2018-03-04 DIAGNOSIS — I5023 Acute on chronic systolic (congestive) heart failure: Secondary | ICD-10-CM | POA: Diagnosis not present

## 2018-03-04 DIAGNOSIS — I130 Hypertensive heart and chronic kidney disease with heart failure and stage 1 through stage 4 chronic kidney disease, or unspecified chronic kidney disease: Secondary | ICD-10-CM | POA: Diagnosis not present

## 2018-03-04 LAB — GLUCOSE, CAPILLARY
GLUCOSE-CAPILLARY: 110 mg/dL — AB (ref 65–99)
Glucose-Capillary: 167 mg/dL — ABNORMAL HIGH (ref 65–99)

## 2018-03-04 LAB — BASIC METABOLIC PANEL
Anion gap: 12 (ref 5–15)
BUN: 28 mg/dL — ABNORMAL HIGH (ref 6–20)
CALCIUM: 9.2 mg/dL (ref 8.9–10.3)
CO2: 28 mmol/L (ref 22–32)
CREATININE: 1.55 mg/dL — AB (ref 0.44–1.00)
Chloride: 100 mmol/L — ABNORMAL LOW (ref 101–111)
GFR calc Af Amer: 39 mL/min — ABNORMAL LOW (ref >60)
GFR, EST NON AFRICAN AMERICAN: 34 mL/min — AB (ref >60)
GLUCOSE: 101 mg/dL — AB (ref 65–99)
POTASSIUM: 3.5 mmol/L (ref 3.5–5.1)
SODIUM: 140 mmol/L (ref 135–145)

## 2018-03-04 LAB — ECHOCARDIOGRAM COMPLETE
HEIGHTINCHES: 62.5 in
WEIGHTICAEL: 4328 [oz_av]

## 2018-03-04 MED ORDER — FUROSEMIDE 40 MG PO TABS: 60.0000 mg | ORAL_TABLET | Freq: Every day | ORAL | 6 refills | Status: DC

## 2018-03-04 MED ORDER — GI COCKTAIL ~~LOC~~
30.0000 mL | Freq: Once | ORAL | Status: AC
  Administered 2018-03-04: 30 mL via ORAL
  Filled 2018-03-04: qty 30

## 2018-03-04 MED ORDER — FUROSEMIDE 40 MG PO TABS
60.0000 mg | ORAL_TABLET | Freq: Every day | ORAL | Status: DC
  Administered 2018-03-04: 60 mg via ORAL
  Filled 2018-03-04: qty 1

## 2018-03-04 MED ORDER — SPIRONOLACTONE 25 MG PO TABS: 25.0000 mg | ORAL_TABLET | Freq: Every day | ORAL | 0 refills | Status: DC

## 2018-03-04 MED ORDER — INSULIN STARTER KIT- PEN NEEDLES (ENGLISH)
1.0000 | Freq: Once | Status: DC
  Filled 2018-03-04: qty 1

## 2018-03-04 NOTE — Discharge Summary (Signed)
Physician Discharge Summary  Stephanie [name] HDQ:[id number] DOB: 1950/11/13 DOA: 03/02/2018  PCP: Carlena Hurl, PA-C  Admit date: 03/02/2018 Discharge date: 03/04/2018  Admitted From: Home Disposition:  Home  Discharge Condition:Stable CODE STATUS:FULL Diet recommendation: Heart Healthy  Brief/Interim Summary:  Patient is a 67 year old female with past medical history of sarcoidosis, atrial fibrillation on Pradaxa, hypertension, disease, chronic diastolic CHF who presents to the emergency department with complaints of shortness of breath, lower extremity edema.  Patient was admitted for the management of acute on chronic CHF.  She was started on IV diuresis.  Cardiology consulted. Patient's respiratory status continued to improve with IV diuresis.  Patient  reported to be not taking Lasix at home.  We emphasized on the medication compliance.  Currently her overall status is stable.  Her echocardiogram showed ejection fraction of 15 to 20%.  Discussed with cardiology today and she will be discharged today to home as cardiology has cleared her for discharge.  She will be called by cardiology for outpatient Holter monitoring.  She should also follow-up with oncology as an outpatient .  Following problems were addressed during her hospitalization:.   Acute on chronic CHF: Patient presented with multiple complaints, she was dyspneic and had worsening of her lower extremity edema. Chest x-ray on admission showed cardiomegaly with mild pulmonary vascular congestion.  Elevated BNP.  Reportedly patient was not taking her heart failure medications.  She follows with cardiology as an outpatient. Echocardiogram showed ejection fraction in the range of 15 to 20%, diffuse hypokinesis.  She has history of nonischemic cardiomyopathy.  Previously, left ventricular dysfunction was felt to be secondary to tachycardia. She was started on IV diuresis and she had output of more than 5 L  On this admission.She  will continue Lasix 60 mg daily at home. Continue her other home medications.  She needs to measure her weight daily at home. She is on Entresto,Coreg. She has been counseled on the importance of fluid restriction and salt intake. Patient was found to be comfortable this morning.    Noncompliance: He is stressed on the importance of taking her medications on time and regular follow-up with her PCP and cardiologist.  History of paroxysmal atrial fibrillation:  She is on Pradaxa.  Rate is controlled.  She will follow-up with cardiology for 48-hour Holter monitoring to make sure her rate is controlled.  Acute kidney injury on CKD stage 2: Creatinine found to be slightly  elevated this morning.    Continue diuresis with lasix at home.  She needs to follow-up with her PCP in a week to check for kidney function and potassium level.  History of hypertension: Found to be hypertensive on presentation. Currently blood pressure stable.  Continue Coreg and Entresto.  Hyperlipidemia: Continue Crestor.  LDL 129   Discharge Diagnoses:  Principal Problem:   Acute on chronic systolic CHF (congestive heart failure) (HCC) Active Problems:   Essential hypertension   Atrial fibrillation (HCC)   Dyslipidemia   Type 2 diabetes mellitus with complication, without long-term current use of insulin ([place])   Noncompliance    Discharge Instructions  Discharge Instructions    Diet - low sodium heart healthy   Complete by:  As directed    Discharge instructions   Complete by:  As directed    1) Please follow up with cardiology for the Holter monitoring as an outpatient.  You will be called by cardiology after discharge. 2)Follow up with your PCP as an outpatient in a week.  During  the follow-up ,please do BMP test to check for potassium level and kidney function. 3)Follow up with your cardiologist Dr. [name] as an outpatient. 4)Take prescribed medications as instructed.  Take Lasix 60 mg daily.  Measure  your weight daily, take an additional 40 mg of Lasix if you gain weight of 2 to 3 pounds . 5) Please restrict water intake to less than 1.2 L a day and salt intake to less than 2 g a day.   Increase activity slowly   Complete by:  As directed      Allergies as of 03/04/2018      Reactions   Amiodarone Other (See Comments)   adverse reaction: Tremor/gait disurbance   Atorvastatin Other (See Comments)   Body aches and pains--stiffness.    Livalo [pitavastatin]    Body aches   Propoxyphene N-acetaminophen Nausea And Vomiting   Albuterol Palpitations   Palpitations, intolerance      Medication List    TAKE these medications   allopurinol 300 MG tablet Commonly known as:  ZYLOPRIM TAKE 300 MG BY MOUTH DAILY AS NEEDED FOR GOUT   ALPRAZolam 1 MG tablet Commonly known as:  XANAX Take 1 tablet (1 mg total) by mouth at bedtime as needed for anxiety.   carvedilol 25 MG tablet Commonly known as:  COREG TAKE 1 TABLET(25 MG) BY MOUTH TWICE DAILY   CINNAMON PO Take 2 capsules by mouth daily.   colchicine 0.6 MG tablet Commonly known as:  COLCRYS Take 1 tablet (0.6 mg total) by mouth daily as needed (GOUT). Reported on 01/21/2016   dabigatran 150 MG Caps capsule Commonly known as:  PRADAXA Take 1 capsule (150 mg total) by mouth 2 (two) times daily.   ENTRESTO 24-26 MG Generic drug:  sacubitril-valsartan TAKE 1 TABLET BY MOUTH TWICE DAILY   furosemide 40 MG tablet Commonly known as:  LASIX Take 1.5 tablets (60 mg total) by mouth daily. Weight daily and take additional lasix 40 mg for weight gain of 2-3 pounds What changed:  additional instructions   gabapentin 100 MG capsule Commonly known as:  NEURONTIN Take 2 capsules (200 mg total) by mouth at bedtime.   Insulin Glargine-Lixisenatide 100-33 UNT-MCG/ML Sopn Commonly known as:  SOLIQUA Inject 15 Units into the skin at bedtime.   levothyroxine 50 MCG tablet Commonly known as:  SYNTHROID, LEVOTHROID Take 1 tablet (50 mcg  total) by mouth daily before breakfast.   potassium chloride SA 20 MEQ tablet Commonly known as:  K-DUR,KLOR-CON Take 1 tablet (20 mEq total) by mouth daily. What changed:    when to take this  reasons to take this   PRESCRIPTION MEDICATION Take 1 tablet by mouth daily as needed (pain). "Disreuex" pain reliever sample given by MD - similar to Ibuprofen   rosuvastatin 20 MG tablet Commonly known as:  CRESTOR Take 1 tablet (20 mg total) by mouth daily.   spironolactone 25 MG tablet Commonly known as:  ALDACTONE Take 1 tablet (25 mg total) by mouth daily. Start taking on:  03/05/2018      Follow-up Information    Tysinger, Camelia Eng, PA-C. Schedule an appointment as soon as possible for a visit in 1 week(s).   Specialty:  Family Medicine Contact information: [place] [address] [phone]        Deboraha Sprang, MD. Schedule an appointment as soon as possible for a visit in 2 week(s).   Specialty:  Cardiology Contact information: [address]  [address] [phone]          Allergies  Allergen Reactions  . Amiodarone Other (See Comments)    adverse reaction: Tremor/gait disurbance  . Atorvastatin Other (See Comments)    Body aches and pains--stiffness.   [name] [Pitavastatin]     Body aches  . Propoxyphene N-Acetaminophen Nausea And Vomiting  . Albuterol Palpitations    Palpitations, intolerance    Consultations:  Cardiology   Procedures/Studies: Dg Chest 2 View  Result Date: 03/02/2018 CLINICAL DATA:  Shortness of breath for 2-3 weeks, worse today. Chest heaviness. EXAM: CHEST - 2 VIEW COMPARISON:  05/13/2017 FINDINGS: Moderate enlargement of the cardiac silhouette is similar to the prior study. There is mild central pulmonary vascular congestion without overt edema, segmental airspace consolidation, pleural effusion, or pneumothorax. No acute osseous abnormality is seen. IMPRESSION: Cardiomegaly and  mild pulmonary vascular congestion. Electronically Signed   By: Logan Bores M.D.   On: 03/02/2018 14:52       Subjective: Patient seen and examined at bedside this morning respiratory status stable.. Complains of stomach upset this morning.  Stable for discharge home today.  Discharge Exam: Vitals:   03/04/18 1001 03/04/18 1231  BP: 105/62 (!) 129/110  Pulse: 62 63  Resp:  18  Temp:  97.8 F (36.6 C)  SpO2: 99% 100%   Vitals:   03/03/18 2046 03/04/18 0543 03/04/18 1001 03/04/18 1231  BP: 111/89 115/74 105/62 (!) 129/110  Pulse: 72 75 62 63  Resp: 18 18  18   Temp: 98 F (36.7 C) 97.8 F (36.6 C)  97.8 F (36.6 C)  TempSrc: Oral Oral  Oral  SpO2: 100% 99% 99% 100%  Weight:  119.6 kg (263 lb 11.2 oz)    Height:        General: Pt is alert, awake, not in acute distress Cardiovascular: RRR, S1/S2 +, no rubs, no gallops Respiratory: CTA bilaterally, no wheezing, no rhonchi Abdominal: Soft, NT, ND, bowel sounds + Extremities: Lower extremity edema, no cyanosis    The results of significant diagnostics from this hospitalization (including imaging, microbiology, ancillary and laboratory) are listed below for reference.     Microbiology: No results found for this or any previous visit (from the past 240 hour(s)).   Labs: BNP (last 3 results) Recent Labs    05/13/17 0618 03/02/18 1331  BNP 238.6* 644.0*   Basic Metabolic Panel: Recent Labs  Lab 03/01/18 1130 03/02/18 1412 03/03/18 0255 03/04/18 0559  NA 142 143 141 140  K 4.0 4.4 4.0 3.5  CL 108 108 101 100*  CO2 26  --  31 28  GLUCOSE 138* 133* 122* 101*  BUN 32* 26* 20 28*  CREATININE 1.25* 1.00 1.36* 1.55*  CALCIUM 9.4  9.3  --  9.4 9.2   Liver Function Tests: Recent Labs  Lab 03/01/18 1130  AST 13  ALT 15  ALKPHOS 58  BILITOT 0.4  PROT 7.4  ALBUMIN 3.9   No results for input(s): LIPASE, AMYLASE in the last 168 hours. No results for input(s): AMMONIA in the last 168 hours. CBC: Recent  Labs  Lab 03/01/18 1130 03/02/18 1331 03/02/18 1412 03/03/18 0255  WBC 5.8 5.5  --  5.0  NEUTROABS 3.1 2.7  --  2.0  HGB 11.6* 11.3* 12.2 11.1*  HCT 34.9* 36.3 36.0 34.9*  MCV 95.6 97.1  --  95.6  PLT 172.0 173  --  166   Cardiac Enzymes: Recent Labs  Lab 03/02/18 1920 03/03/18 0255  TROPONINI <  0.03 <0.03   BNP: Invalid input(s): POCBNP CBG: Recent Labs  Lab 03/03/18 1138 03/03/18 1639 03/03/18 2014 03/04/18 0722 03/04/18 1228  GLUCAP 156* 131* 113* 110* 167*   D-Dimer No results for input(s): DDIMER in the last 72 hours. Hgb A1c No results for input(s): HGBA1C in the last 72 hours. Lipid Profile Recent Labs    03/03/18 0255  CHOL 197  HDL 53  LDLCALC 129*  TRIG 76  CHOLHDL 3.7   Thyroid function studies No results for input(s): TSH, T4TOTAL, T3FREE, THYROIDAB in the last 72 hours.  Invalid input(s): FREET3 Anemia work up No results for input(s): VITAMINB12, FOLATE, FERRITIN, TIBC, IRON, RETICCTPCT in the last 72 hours. Urinalysis    Component Value Date/Time   COLORURINE YELLOW 05/13/2017 0948   APPEARANCEUR CLEAR 05/13/2017 0948   LABSPEC 1.013 05/13/2017 0948   PHURINE 5.0 05/13/2017 0948   GLUCOSEU NEGATIVE 05/13/2017 0948   HGBUR MODERATE (A) 05/13/2017 0948   HGBUR negative 09/02/2009 1428   BILIRUBINUR NEGATIVE 05/13/2017 0948   BILIRUBINUR n 08/02/2015 [place] 05/13/2017 0948   PROTEINUR NEGATIVE 05/13/2017 0948   UROBILINOGEN negative 08/02/2015 1125   UROBILINOGEN 0.2 08/16/2011 2150   NITRITE NEGATIVE 05/13/2017 0948   LEUKOCYTESUR NEGATIVE 05/13/2017 0948   Sepsis Labs Invalid input(s): PROCALCITONIN,  WBC,  LACTICIDVEN Microbiology No results found for this or any previous visit (from the past 240 hour(s)).  Please note: You were cared for by a hospitalist during your hospital stay. Once you are discharged, your primary care physician will handle any further medical issues. Please note that NO REFILLS for any  discharge medications will be authorized once you are discharged, as it is imperative that you return to your primary care physician (or establish a relationship with a primary care physician if you do not have one) for your post hospital discharge needs so that they can reassess your need for medications and monitor your lab values.    Time coordinating discharge: 40 minutes  SIGNED:   Shelly Coss, MD  [facility] 03/04/2018, 1:06 PM Pager [phone]  If 7PM-7AM, please contact night-coverage [email or web address] Password [email or web address]

## 2018-03-04 NOTE — Progress Notes (Signed)
Noted that last HgbA1C February, 2019 was 7.3%.  Not sure patient needs insulin at home if A1C is less than 9%. Could possibly be on oral medication for diabetes. Per patient, she was prescribed insulin, but had not been taking because she did not know how to use or how much to take.  Will need to follow up with PCP or cardiac physician at discharge.   Harvel Ricks RN BSN CDE Diabetes Coordinator Pager: [phone]  8am-5pm

## 2018-03-04 NOTE — Progress Notes (Signed)
Progress Note  Patient Name: Stephanie [name] Date of Encounter: 03/04/2018  Primary Cardiologist: Dr [name]  Subjective   No dyspnea or chest pain  Inpatient Medications    Scheduled Meds: . allopurinol  300 mg Oral Daily  . carvedilol  25 mg Oral BID WC  . dabigatran  150 mg Oral BID  . furosemide  40 mg Intravenous Q12H  . insulin aspart  0-15 Units Subcutaneous TID WC  . insulin aspart  0-5 Units Subcutaneous QHS  . insulin glargine  15 Units Subcutaneous QHS  . levothyroxine  50 mcg Oral QAC breakfast  . living well with diabetes book   Does not apply Once  . rosuvastatin  20 mg Oral q1800  . sacubitril-valsartan  1 tablet Oral BID  . sodium chloride flush  3 mL Intravenous Q12H  . spironolactone  25 mg Oral Daily   Continuous Infusions: . sodium chloride     PRN Meds: sodium chloride, acetaminophen, ALPRAZolam, loperamide, ondansetron (ZOFRAN) IV, sodium chloride flush   Vital Signs    Vitals:   03/03/18 0425 03/03/18 1141 03/03/18 2046 03/04/18 0543  BP: (!) 127/91 135/88 111/89 115/74  Pulse: 85 77 72 75  Resp: 20 18 18 18   Temp:  97.8 F (36.6 C) 98 F (36.7 C) 97.8 F (36.6 C)  TempSrc:  Oral Oral Oral  SpO2: 98% 98% 100% 99%  Weight: 270 lb 8 oz (122.7 kg)   263 lb 11.2 oz (119.6 kg)  Height:        Intake/Output Summary (Last 24 hours) at 03/04/2018 0838 Last data filed at 03/04/2018 0545 Gross per 24 hour  Intake 480 ml  Output 3650 ml  Net -3170 ml   Filed Weights   03/02/18 1658 03/03/18 0425 03/04/18 0543  Weight: 270 lb 8 oz (122.7 kg) 270 lb 8 oz (122.7 kg) 263 lb 11.2 oz (119.6 kg)    Telemetry    Atrial fibrillation rate controlled- Personally Reviewed   Physical Exam   GEN: No acute distress.   Neck: No JVD Cardiac: irregular Respiratory: Clear to auscultation bilaterally. GI: Soft, nontender, non-distended  MS: No edema Neuro:  Nonfocal  Psych: Normal affect   Labs    Chemistry Recent Labs  Lab 03/01/18 1130  03/02/18 1412 03/03/18 0255 03/04/18 0559  NA 142 143 141 140  K 4.0 4.4 4.0 3.5  CL 108 108 101 100*  CO2 26  --  31 28  GLUCOSE 138* 133* 122* 101*  BUN 32* 26* 20 28*  CREATININE 1.25* 1.00 1.36* 1.55*  CALCIUM 9.4  9.3  --  9.4 9.2  PROT 7.4  --   --   --   ALBUMIN 3.9  --   --   --   AST 13  --   --   --   ALT 15  --   --   --   ALKPHOS 58  --   --   --   BILITOT 0.4  --   --   --   GFRNONAA  --   --  40* 34*  GFRAA  --   --  46* 39*  ANIONGAP  --   --  9 12     Hematology Recent Labs  Lab 03/01/18 1130 03/02/18 1331 03/02/18 1412 03/03/18 0255  WBC 5.8 5.5  --  5.0  RBC 3.65* 3.74*  --  3.65*  HGB 11.6* 11.3* 12.2 11.1*  HCT 34.9* 36.3 36.0 34.9*  MCV 95.6 97.1  --  95.6  MCH  --  30.2  --  30.4  MCHC 33.1 31.1  --  31.8  RDW 14.0 13.6  --  13.4  PLT 172.0 173  --  166    Cardiac Enzymes Recent Labs  Lab 03/02/18 1920 03/03/18 0255  TROPONINI <0.03 <0.03    Recent Labs  Lab 03/02/18 1411  TROPIPOC 0.01     BNP Recent Labs  Lab 03/01/18 1130 03/02/18 1331  BNP  --  544.8*  PROBNP 715.0*  --       Radiology    Dg Chest 2 View  Result Date: 03/02/2018 CLINICAL DATA:  Shortness of breath for 2-3 weeks, worse today. Chest heaviness. EXAM: CHEST - 2 VIEW COMPARISON:  05/13/2017 FINDINGS: Moderate enlargement of the cardiac silhouette is similar to the prior study. There is mild central pulmonary vascular congestion without overt edema, segmental airspace consolidation, pleural effusion, or pneumothorax. No acute osseous abnormality is seen. IMPRESSION: Cardiomegaly and mild pulmonary vascular congestion. Electronically Signed   By: Logan Bores M.D.   On: 03/02/2018 14:52   Patient Profile     67 year old female with past medical history of permanent atrial fibrillation, sarcoidosis, irritable bowel syndrome, nonischemic cardiomyopathy, chronic systolic congestive heart failure for evaluation of acute on chronic systolic congestive heart  failure.    Assessment & Plan    1 acute on chronic systolic congestive heart failure-I/O-2930; Weight 263.  Patient's symptoms have improved.  Will change Lasix to 60 mg by mouth daily.  Continue Spironolactone.  She will weigh daily and take an additional 40 mg of Lasix for weight gain of 2 to 3 pounds.  Patient instructed on importance of low-sodium diet and fluid restriction.  2 nonischemic cardiomyopathy-plan to continue carvedilol and Entresto.    Blood pressure will not tolerate advances at this point.  Repeat echocardiogram pending.  Would consider 48-hour Holter monitor after discharge to make sure her heart rate is controlled.  If not could add digoxin.  Previous reduction in LV function felt secondary possibly to tachycardia.  3 permanent atrial fibrillation-continue carvedilol for rate control.  Outpatient 48-hour Holter monitor to make sure that rate is controlled. Continue pradaxa.  4 noncompliance-patient previously instructed on importance of taking medications.  5 chronic stage III kidney disease-Will need BMET one week followiing DC.  6 hypertension-blood pressure is controlled.  Continue present medications.  Patient can be discharged from a cardiac standpoint.  She will need a transition of care appointment in 1 week with potassium and renal function checked at that time.  Follow-up Dr. [name] 6 to 8 weeks.  For questions or updates, please contact [place] Please consult [email or web address] for contact info under Cardiology/STEMI.      Signed, Kirk Ruths, MD  03/04/2018, 8:38 AM

## 2018-03-04 NOTE — Progress Notes (Signed)
Discharge instructions (including medications) discussed with and copy provided to patient/caregiver along with paper prescription. 

## 2018-03-04 NOTE — Progress Notes (Signed)
Patient complaining of burning pain in her stomach since taking levothyroxine.   Paged MD

## 2018-03-04 NOTE — Care Management Note (Signed)
Case Management Note  Patient Details  Name: Stephanie [name] MRN: [id number] Date of Birth: 1951/08/09  Subjective/Objective:  CHF                 Action/Plan:  Patient lives at home with her son;PCP: Tysinger, Leward Quan; has private insurance with Medicare; [place] choice offered, pt chose [facility]; [facility] with [facility] called for arrangements;  Expected Discharge Date:  03/04/18               Expected Discharge Plan:  [name]  In-House Referral:   [facility]  Discharge planning Services  CM Consult  Post Acute Care Choice:    Choice offered to:  Patient  HH Arranged:  RN [facility] Agency:  [facility]  Status of Service:  In process, will continue to follow  [name] [phone] 03/04/2018, 3:07 PM

## 2018-03-04 NOTE — Consult Note (Addendum)
   [address] [facility] CM Inpatient Consult   03/04/2018  Stephanie [name] [birth year] [id number]   Follow up:  Met with patient again today regarding post hospital follow up with [facility].  Patient verbally agreed to [facility] RN Telephonic nurse to follow up as she is new to insulin and is leery of home visits at this time.  She also states she has a lot going home that she would be going to events she and her family has planned.    Patient was referred by Diabetes Coordinator.  Please see notes for recommendations with primary care provider.   For questions, please contact:  Natividad Brood, RN BSN [facility] Liaison  [phone] business mobile phone Toll free office [phone]

## 2018-03-07 ENCOUNTER — Telehealth: Payer: Self-pay | Admitting: Medical

## 2018-03-07 NOTE — Telephone Encounter (Signed)
Left message for pt to call. Pt was recently in the hospital and needs a TOC call. Pt already has an appt scheduled for 03/09/2018.

## 2018-03-09 ENCOUNTER — Ambulatory Visit (INDEPENDENT_AMBULATORY_CARE_PROVIDER_SITE_OTHER): Payer: Medicare Other | Admitting: Medical

## 2018-03-09 ENCOUNTER — Encounter: Payer: Self-pay | Admitting: Medical

## 2018-03-09 VITALS — BP 140/80 | HR 87 | Temp 98.1°F | Ht 63.0 in | Wt 270.0 lb

## 2018-03-09 DIAGNOSIS — Z6841 Body Mass Index (BMI) 40.0 and over, adult: Secondary | ICD-10-CM

## 2018-03-09 DIAGNOSIS — E118 Type 2 diabetes mellitus with unspecified complications: Secondary | ICD-10-CM

## 2018-03-09 DIAGNOSIS — I4819 Other persistent atrial fibrillation: Secondary | ICD-10-CM

## 2018-03-09 DIAGNOSIS — I5023 Acute on chronic systolic (congestive) heart failure: Secondary | ICD-10-CM

## 2018-03-09 DIAGNOSIS — I10 Essential (primary) hypertension: Secondary | ICD-10-CM

## 2018-03-09 DIAGNOSIS — Z72821 Inadequate sleep hygiene: Secondary | ICD-10-CM

## 2018-03-09 DIAGNOSIS — Z8659 Personal history of other mental and behavioral disorders: Secondary | ICD-10-CM

## 2018-03-09 DIAGNOSIS — M25561 Pain in right knee: Secondary | ICD-10-CM

## 2018-03-09 DIAGNOSIS — E538 Deficiency of other specified B group vitamins: Secondary | ICD-10-CM

## 2018-03-09 DIAGNOSIS — I429 Cardiomyopathy, unspecified: Secondary | ICD-10-CM

## 2018-03-09 DIAGNOSIS — E785 Hyperlipidemia, unspecified: Secondary | ICD-10-CM

## 2018-03-09 DIAGNOSIS — G8929 Other chronic pain: Secondary | ICD-10-CM

## 2018-03-09 DIAGNOSIS — M25562 Pain in left knee: Secondary | ICD-10-CM

## 2018-03-09 DIAGNOSIS — M170 Bilateral primary osteoarthritis of knee: Secondary | ICD-10-CM | POA: Diagnosis not present

## 2018-03-09 DIAGNOSIS — I481 Persistent atrial fibrillation: Secondary | ICD-10-CM | POA: Diagnosis not present

## 2018-03-09 DIAGNOSIS — L989 Disorder of the skin and subcutaneous tissue, unspecified: Secondary | ICD-10-CM

## 2018-03-09 DIAGNOSIS — I872 Venous insufficiency (chronic) (peripheral): Secondary | ICD-10-CM

## 2018-03-09 DIAGNOSIS — R609 Edema, unspecified: Secondary | ICD-10-CM | POA: Diagnosis not present

## 2018-03-09 MED ORDER — HYDROCORTISONE VALERATE 0.2 % EX OINT: 1.0000 "application " | TOPICAL_OINTMENT | Freq: Two times a day (BID) | CUTANEOUS | 0 refills | Status: DC

## 2018-03-09 NOTE — Patient Instructions (Addendum)
Here is your current medication list and recommendations   Bring in ALL of your pill bottles so we can review them  Take your Hospital Summary to your pharmacy and talk with the pharmacist.  Compare your medications with the pharmacy to make sure you don't have discontinued medications or duplicates   Diabetes  Check your sugar in the morning fasting daily and write these numbers down in a logbook so we can see them when you come in  If you are getting low on test strips or lancets please call before you run out   You should walk every day for exercise, with a goal of at least 45 minutes a day.  If you are not currently doing this, start walking at least 15 minutes a day.  Add on 5 minutes of walking per week gradually increasing your exercise tolerance  Talk with cardiology about referral to cardiac rehab to help with this  Unfortunately the box of Soliqua pen devices that you had yesterday had set out for over a month at room temperature so they are no good.  The medication probably would not be effective.  Double check with the pharmacy to see if there is anything they can do to help refund some of the cost or get you an additional supply  Obesity  A year ago your baseline weight was 256  In addition to fluid and heart failure, you have gained weight in recent months  Your current baseline weight seems to be 260lb.     Work on exercising, eating healthy and trying to get back down to 256lb as an initial goal   High cholesterol  Take rosuvastatin/Crestor 20 mg daily at bedtime  Gout  Take allopurinol 300 mg daily in the morning for prevention  Take colchicine 0.6 mg as needed up to twice a day for gout flareup.  High blood pressure, A. fib,  and Heart Disease  Take carvedilol/Coreg 25 mg twice daily  Take Pradaxa/dabigatran 150 mg twice daily  Take Entresto/sacubitril/valsartan 24/26 mg twice daily   You should limit salt to 2 g total daily  You should limit your  water intake to less than 1.2 L a day  Heart failure and swelling  Please weigh yourself every day to see if you are having fluid weight gain  Follow up with Cardiology soon as planned.  You are suppose to have a Holter monitor test  Take furosemide/Lasix 40 mg, 1.5 tablets daily.    If you have 2 to 3 pound weight gain over the course of a day or 2, then take an additional Lasix 40 mg daily until your weight is back down to baseline  Take potassium/K-Dur 20 M EQ daily along with your Lasix.  Take Spironolactone 25 mg daily in the morning  Thyroid disease  Take levothyroxine 50 mcg daily in the morning on empty stomach 45 minutes before breakfast  Neuropathy, chronic pain  Take gabapentin 100 mg, 2 capsules at bedtime daily  Chronic kidney disease  It is important to take your medications as prescribed daily to prevent further damage to the kidneys by high blood pressure diabetes  Avoid ibuprofen, Aleve, Advil, Motrin, or similar medications as they can damage the kidney  Medication issues  In the future if you are getting low on a medication called your pharmacy for refills or let us know if there is an issue  If a medication is too expensive let us know  if you are confused about her medication  please call us or call the pharmacy or come in    Consider using [facility] so you could have your month the medications bubble wrapped for individual day use to make it less confusing  If you do not feel comfortable getting 90-day supply of medications at the time, then we can switch you to 30-day supply.  You may need to talk with your insurance company about this  Sleep issues  In the past you have had poor sleep hygiene, inconsistent sleep schedule  This is something you need to work on  I recommend going to bed at the same time every day and plan to get up at the same time every day  Do not watch TV or read in the bed  Do not eat or drink within an hour at  bedtime  Anxiety/worry  I recommend you see a counselor  Office visits  Due to your chronic issue you should be seeing me every 3 months  You should be seeing your heart doctor at least twice a year or more frequently if they have a different recommendation  You should see your dentist twice yearly  You should see your eye doctor every year  Other recommendations  You should have a mammogram every year

## 2018-03-09 NOTE — Progress Notes (Signed)
Subjective: Chief Complaint  Patient presents with  . Follow-up   Here for hospital f/u.    Medical team: Carlena Hurl, PA-C here for primary care Dr. Hardie Pulley podiatry Dr. Charlann Boxer, orthopedics Roderic Palau, NP afib clinic Dr. [name], cardiology Dr. Sullivan Lone, hematology Dr. [place] Cellar, GI  I saw her 03/02/18 at which point we sent to the emergency dept for acute heart failure symptoms.  She is feeling better.    She went on sort of a diatribe today.  She has several comments about her care here and elsewhere today, she feels like either [place] or the pharmacy keep sending medications when she is not out which leads to confusion.  Of note, she did not bring in any pill bottles today.  She also notes having a large bag of pills at home that she doesn't know what to do with.   She states [facility] sent 2 rounds of Allopurinol and Coreg in the same months.  She notes tell [facility] to stop sending extra Lasix but they send them anyways.    She is frustrated that she says we never showed her how to use [place] that was recommended a few visits ago although our documentation shows that [place] called patient and recommended she come in for demonstration of how to use [place].    Interestingly, she brought in her unopened box of Soliqua pens today with prescription date in 11/2017.   She notes that her son picked these up for her but they have been at room temperature since 11/2017.  She voices that she has never used these, and says we never offered to demonstrate proper use.  She also says pharmacy didn't instruct on refrigeration or proper use.  (there was only the small print on the back advising refrigeration, no highlighted color or obvious refrigeration logo from pharmacy).   She seems to blame all of her doctors for not calling her to schedule follow up appointments in recent months.    She notes that she was very irritated with the endocrinology office she went to last  year, she said despite telling the doctors she did not want to take certain medication she was told to take them anyway.  She did not continue therapy per their recommendations.  She states that somewhere around a year ago another doctor here in our practice referred her to home health she says, but then she refused home health.  She has no other new c/o.  Past Medical History:  Diagnosis Date  . Anemia   . Ankle fracture, right    x 2  . Antineoplastic and immunosuppressive drugs causing adverse effect in therapeutic use   . Asthma   . Chronic venous hypertension without complications   . Colon polyps 2009  . Coronary artery disease    no CAD by cath 11.2010  . Depressive disorder, not elsewhere classified   . Diabetes mellitus type 2 in obese (HCC)    hgb AIC 6.1 09/02/2009  . Diastolic CHF, chronic ([place])   . Diverticulosis of colon 2009   colonoscopy  . Gout   . Hepatitis, unspecified    hx of  . Hx of hysterectomy 2002  . Hyperlipidemia   . Hypertension   . Hypothyroidism   . Irritable bowel syndrome   . Lung nodule    81mm RLL nodule on CT Chest  07/2009, resolved by 2011 CT  . Noncompliance   . Obesity   . Persistent atrial fibrillation ([place])  s/p DCCV 07/2009; Pradaxa Rx  . PONV (postoperative nausea and vomiting)   . Sarcoidosis    dx by eye exam  . Sleep disturbance 06/2013   sleep study, mild abnormality, no criteria for CPAP   Current Outpatient Medications on File Prior to Visit  Medication Sig Dispense Refill  . allopurinol (ZYLOPRIM) 300 MG tablet TAKE 300 MG BY MOUTH DAILY AS NEEDED FOR GOUT 90 tablet 2  . ALPRAZolam (XANAX) 1 MG tablet Take 1 tablet (1 mg total) by mouth at bedtime as needed for anxiety. 45 tablet 1  . carvedilol (COREG) 25 MG tablet TAKE 1 TABLET(25 MG) BY MOUTH TWICE DAILY 180 tablet 1  . CINNAMON PO Take 2 capsules by mouth daily.    . colchicine (COLCRYS) 0.6 MG tablet Take 1 tablet (0.6 mg total) by mouth daily as needed (GOUT).  Reported on 01/21/2016 30 tablet 1  . dabigatran (PRADAXA) 150 MG CAPS capsule Take 1 capsule (150 mg total) by mouth 2 (two) times daily. 60 capsule 5  . ENTRESTO 24-26 MG TAKE 1 TABLET BY MOUTH TWICE DAILY 180 tablet 0  . furosemide (LASIX) 40 MG tablet Take 1.5 tablets (60 mg total) by mouth daily. Weight daily and take additional lasix 40 mg for weight gain of 2-3 pounds 45 tablet 6  . gabapentin (NEURONTIN) 100 MG capsule Take 2 capsules (200 mg total) by mouth at bedtime. 60 capsule 3  . Insulin Glargine-Lixisenatide (SOLIQUA) 100-33 UNT-MCG/ML SOPN Inject 15 Units into the skin at bedtime. 3 mL 2  . levothyroxine (SYNTHROID, LEVOTHROID) 50 MCG tablet Take 1 tablet (50 mcg total) by mouth daily before breakfast. 90 tablet 1  . potassium chloride SA (K-DUR,KLOR-CON) 20 MEQ tablet Take 1 tablet (20 mEq total) by mouth daily. (Patient taking differently: Take 20 mEq by mouth daily as needed (supplement). ) 90 tablet 3  . PRESCRIPTION MEDICATION Take 1 tablet by mouth daily as needed (pain). "Disreuex" pain reliever sample given by MD - similar to Ibuprofen    . rosuvastatin (CRESTOR) 20 MG tablet Take 1 tablet (20 mg total) by mouth daily. 90 tablet 3  . spironolactone (ALDACTONE) 25 MG tablet Take 1 tablet (25 mg total) by mouth daily. 30 tablet 0   No current facility-administered medications on file prior to visit.    ROS as in subjective   Objective: BP 140/80   Pulse 87   Temp 98.1 F (36.7 C) (Oral)   Ht 5\' 3"  (1.6 m)   Wt 270 lb (122.5 kg)   SpO2 97%   BMI 47.83 kg/m   Wt Readings from Last 3 Encounters:  03/09/18 270 lb (122.5 kg)  03/04/18 263 lb 11.2 oz (119.6 kg)  03/02/18 274 lb 6.4 oz (124.5 kg)   BP Readings from Last 3 Encounters:  03/09/18 140/80  03/04/18 (!) 129/110  03/02/18 140/88    Gen: wd, wn, nad No frank edema today There still is some round brown crusted lesions of lower legs that appear a little improved from last visit No dyspnea Lungs  clear Heart without obvious murmur, irregularly irregular    Assessment: Encounter Diagnoses  Name Primary?  . Essential hypertension Yes  . Persistent atrial fibrillation ([place])   . Acute on chronic systolic CHF (congestive heart failure) ([place])   . Cardiomyopathy, secondary ([place])   . Type 2 diabetes mellitus with complication, without long-term current use of insulin ([place])   . Primary osteoarthritis of both knees   . Dyslipidemia   . Class  3 severe obesity due to excess calories with serious comorbidity and body mass index (BMI) of 40.0 to 44.9 in adult [facility])   . B12 deficiency   . Edema, unspecified type   . Chronic pain of both knees   . History of behavioral and mental health problems   . Poor sleep hygiene   . Venous stasis dermatitis, unspecified laterality   . Skin lesion     Plan: I started the conversation today apologizing to her for making her feel the way she stated.  She felt like I was harsh with her last visit.  I don't recall my tone seeming that way, but I apologized.   I also advised that if she feels she is more comfortable with another PCP then she should do what she feels is best.   I want the best care for her.   I also advised that this is a 2 way street, and that she has responsibilities as a patient to communicate, to take her medications, to see her doctors for adequate follow up.  I reviewed hospital discharge summary, reviewed medication records in discharge summary, reviewed recommendations  Diabetes - HgbA1C 7.3% on 11/25/17.   She didn't seem sure about whether she had adequate glucometer strips and lancets at home today.   She gave me varying answers.  Advised she check and call back if needing refills.  Advised daily fasting glucose monitoring.    Discussed the importance of daily weights, f/u with cardiology, and improving on sleep hygiene.  Discussed the following recommendations and printed the summary    Here is your current medication list and  recommendations  Diabetes  Check your sugar in the morning fasting daily and write these numbers down in a logbook so we can see them when you come in  If you are getting low on test strips or lancets please call before you run out   You should walk every day for exercise, with a goal of at least 45 minutes a day.  If you are not currently doing this, start walking at least 15 minutes a day.  Add on 5 minutes of walking per week gradually increasing your exercise tolerance  Talk with cardiology about referral to cardiac rehab to help with this  Unfortunately the box of Soliqua pen devices that you had yesterday had set out for over a month at room temperature so they are no good.  The medication probably would not be effective.  Double check with the pharmacy to see if there is anything they can do to help refund some of the cost or get you an additional supply  Obesity  A year ago your baseline weight was 256  In addition to fluid and heart failure, you have gained weight in recent months  Your current baseline weight seems to be 260lb.     Work on exercising, eating healthy and trying to get back down to 256lb as an initial goal   High cholesterol  Take rosuvastatin/Crestor 20 mg daily at bedtime  Gout  Take allopurinol 300 mg daily in the morning for prevention  Take colchicine 0.6 mg as needed up to twice a day for gout flareup.  High blood pressure, A. fib,  and Heart Disease  Take carvedilol/Coreg 25 mg twice daily  Take Pradaxa/dabigatran 150 mg twice daily  Take Entresto/sacubitril/valsartan 24/26 mg twice daily   You should limit salt to 2 g total daily  You should limit your water intake to less than  1.2 L a day  Heart failure and swelling  Please weigh yourself every day to see if you are having fluid weight gain  Follow up with Cardiology soon as planned.  You are suppose to have a Holter monitor test  Take furosemide/Lasix 40 mg, 1.5 tablets daily.     If you have 2 to 3 pound weight gain over the course of a day or 2, then take an additional Lasix 40 mg daily until your weight is back down to baseline  Take potassium/K-Dur 20 M EQ daily along with your Lasix.  Take Spironolactone 25 mg daily in the morning  Thyroid disease  Take levothyroxine 50 mcg daily in the morning on empty stomach 45 minutes before breakfast  Neuropathy, chronic pain  Take gabapentin 100 mg, 2 capsules at bedtime daily  Chronic kidney disease  It is important to take your medications as prescribed daily to prevent further damage to the kidneys by high blood pressure diabetes  Avoid ibuprofen, Aleve, Advil, Motrin, or similar medications as they can damage the kidney  Medication issues  In the future if you are getting low on a medication called your pharmacy for refills or let us know if there is an issue  If a medication is too expensive let us know  if you are confused about her medication please call us or call the pharmacy or come in    Consider using [facility] so you could have your month the medications bubble wrapped for individual day use to make it less confusing  If you do not feel comfortable getting 90-day supply of medications at the time, then we can switch you to 30-day supply.  You may need to talk with your insurance company about this  Sleep issues  In the past you have had poor sleep hygiene, inconsistent sleep schedule  This is something you need to work on  I recommend going to bed at the same time every day and plan to get up at the same time every day  Do not watch TV or read in the bed  Do not eat or drink within an hour at bedtime  Anxiety/worry  I recommend you see a counselor  Office visits  Due to your chronic issue you should be seeing me every 3 months  You should be seeing your heart doctor at least twice a year or more frequently if they have a different recommendation  You should see your  dentist twice yearly  You should see your eye doctor every year  Other recommendations  You should have a mammogram every year   [place] was seen today for follow-up.  Diagnoses and all orders for this visit:  Essential hypertension -     Basic metabolic panel  Persistent atrial fibrillation (HCC)  Acute on chronic systolic CHF (congestive heart failure) (HCC)  Cardiomyopathy, secondary ([place])  Type 2 diabetes mellitus with complication, without long-term current use of insulin (HCC)  Primary osteoarthritis of both knees  Dyslipidemia  Class 3 severe obesity due to excess calories with serious comorbidity and body mass index (BMI) of 40.0 to 44.9 in adult [facility])  B12 deficiency  Edema, unspecified type  Chronic pain of both knees  History of behavioral and mental health problems  Poor sleep hygiene  Venous stasis dermatitis, unspecified laterality  Skin lesion  Other orders -     hydrocortisone valerate ointment (WESTCORT) 0.2 %; Apply 1 application topically 2 (two) times daily.   F/u with visit  with nurse to examine your big target bag of medications

## 2018-03-10 ENCOUNTER — Other Ambulatory Visit: Payer: Self-pay

## 2018-03-10 LAB — BASIC METABOLIC PANEL
BUN / CREAT RATIO: 27 (ref 12–28)
BUN: 29 mg/dL — AB (ref 8–27)
CO2: 23 mmol/L (ref 20–29)
CREATININE: 1.09 mg/dL — AB (ref 0.57–1.00)
Calcium: 9.7 mg/dL (ref 8.7–10.3)
Chloride: 104 mmol/L (ref 96–106)
GFR, EST AFRICAN AMERICAN: 62 mL/min/{1.73_m2} (ref >59)
GFR, EST NON AFRICAN AMERICAN: 53 mL/min/{1.73_m2} — AB (ref >59)
Glucose: 146 mg/dL — ABNORMAL HIGH (ref 65–99)
Potassium: 4.6 mmol/L (ref 3.5–5.2)
SODIUM: 143 mmol/L (ref 134–144)

## 2018-03-10 NOTE — Patient Outreach (Signed)
[facility]) Care Management  03/10/2018  Stephanie [name] Feb 22, 1951 [id number]   Transition of care / EMMI red alert Referral date: 03/04/18 Referral source: discharged from the hospital on 03/04/18 Insurance: Medicare  Telephone call to patient regarding transition of care referral and EMMI red alert. HIPAA verified with patient. Explained reason for call. Patient states she saw her primary care provider on yesterday.  Patient states she talked with her doctor regarding the insulin she has at home but has not been taking. Patient states she didn't know she was suppose to take the medication prior to her hospitalization.  Patient states she did not have any needles to use with the insulin pen.Patient states her primary care provider showed her at her recent appointment how she she use her insulin. She states that her doctor was unsure if she should use the insulin she has because it was not stored properly.  Patient states her doctor told her to call the pharmacist to see if she can still use it.  Patient states she received a call from [facility]. States she told them she did not need them now because she didn't want them to overwhelm her coming so frequently. Patient states she doesn't want anyone forcing her to do something she doesn't want to. Patient states she told [facility] she would call them back if she needs them.  Patient states her discharge instructions state she needs to follow up with Dr. [name]. Patient verbalized frustration that she has not seen Dr. [name] in a while and she felt she was dropped off from the practice. RNCM advised patient to call and schedule follow up appointment with Dr. [name] to be re-established with the practice or she could talk with her primary care provider regarding a referral to a new cardiologist. Patient verbalized understanding.  Patient reports her primary provider wants her to bring her medication back in the office and be  seen again. Patient states she will try to be seen on tomorrow. Reports she has to call [facility] at least 24 hours in advance for transportation. RNCM advised patient to call her primary care provider to schedule appointment.   Patient reports she is not weighing. States she has a scale but the scale is old. RNCM discussed with patient importance of weighing and recording weights daily. Advised that patient should weigh around the same time daily with the same weighed clothes or no clothes.  RNCM discussed signs/ symptoms of heart failure. Advised patient if she gains 3 lbs overnight or 5 lbs in a week she should report this to her doctor. Patient reports needing assistance with getting a scale.  Patient states she is not taking all of her medications as prescribed. Patient states, " I am not going to keep taking all this medication because some medication causes me to feel bad or to be nauseated."   RNCM discussed and offered [facility] care management community nurse follow up. Patient states she would be willing to have 1 in home visit with nurse to start.  RNCM advised patient to notify MD of any changes in condition prior to scheduled appointment. RNCM provided contact name and number: [phone] or main office number [phone] and 24 hour nurse advise line [phone].  RNCM verified patient aware of 911 services for urgent/ emergent needs.   ASSESSMENT: Patient very guarded and expresses confusion regarding her treatment regime. Expresses difficulty understanding the coordination of her care. Would benefit from community nurse referral.   PLAN;  RNCM will refer patient to community case manager.   Quinn Plowman RN,BSN,CCM [facility] Telephonic  [phone]       PLAN:  f

## 2018-03-11 ENCOUNTER — Telehealth: Payer: Self-pay | Admitting: Medical

## 2018-03-11 ENCOUNTER — Other Ambulatory Visit: Payer: Self-pay | Admitting: *Deleted

## 2018-03-11 NOTE — Patient Outreach (Signed)
[facility]) Care Management  03/11/2018  Afra Tricarico September 23, 1951 [id number]   Telephone assessment  EMMI red alert message received 5/23  regarding questions   : Weighing self: No,  Filled new prescription : No     Unsuccessful Outreach call to patient , able to leave a HIPAA compliant message for return call.  Plan  Will update assigned care manager of unsuccessful call attempt to follow up on EMMI red alert.   Joylene Draft, RN, [place] Management Coordinator  [phone]- Mobile [phone]- Toll Free Main Office

## 2018-03-11 NOTE — Telephone Encounter (Signed)
Left message for pt regarding labds & Shane's notes and advised need schedule appt next week and bring ALL medications

## 2018-03-15 ENCOUNTER — Other Ambulatory Visit: Payer: Self-pay | Admitting: *Deleted

## 2018-03-15 NOTE — Patient Outreach (Signed)
[place] [facility]) Care Management  03/15/2018  Stephanie [name] 09/06/1951 [id number]    EMMI RED HF/TA referral received by this RN case management today  RN spoke with pt today and introduced the [facility] program/services and the purpose for today's call. RN inquired on EMMI RED further with her response to depression. Pt states she "alright". RN offered counseling via Education officer, museum or possible consult with a psychiatrist or psychologist via referral from her primary provider however pt opt to decline both. Pt was having issues with her phone going in and out and indicated it needed to be charged. RN offered a call back that was scheduled for tomorrow at 1 pm based upon pt's schedule. Will follow up tomorrow and inquired further on possible needs.  [name], RN Care Management Coordinator [place] Office [phone]

## 2018-03-16 ENCOUNTER — Other Ambulatory Visit: Payer: Self-pay | Admitting: *Deleted

## 2018-03-16 ENCOUNTER — Encounter: Payer: Self-pay | Admitting: *Deleted

## 2018-03-16 NOTE — Patient Outreach (Signed)
Stephanie [name]) Care Management  03/16/2018  Talayla Doyel 1950/11/18 [id number]  Telephone Assessment  RN spoke with pt today and introduced once again [facility] services and the purpose for today's call. Discussed possible needs and inquired further on pt's diabetes and non-adherence with taking her insulin. Pt states she did not trust [facility] and this is why she declined services for [facility] after her discharge. Pt feels she was not explained in detail on the purpose of taking the prescribed insulin and felt uneducated on how to administer this medication. Pt states she continue to take her Metformin and has the insulin but again was not aware of how to store it or again administer this medication. Pt states she does not complete any CBGs since last year with no reported readings. Pt not aware of her A1C and not sure when it was taken last. Other issues discussed related to pt's HF based upon her last admission. RN educated pt on both her diabetes and the HF zones. Stress the importance and risk involved if these medical conditional go unmonitored leading to more acute or chronic symptoms. Pt with an understanding and receptive to the education provided today. RN stress the importance of managing her medical condition and offered to assist further based upon today's conversation to refer via [place] and offered a one-on-one home visit to assist in her management of care. Pt receptive after the initial assessment was completed and scheduled a home visit for next Tuesday based upon pt's request. Pt does not have working scales. Therefore will contact [facility] for scales to be delivered for pt adherence with managing her HF. Stress the importance of monitoring for fluid retention and swelling to her extremities. If 3 lbs over night or 5 lbs within one week pt aware to contact her provider for instructions for interventions. Note Lasix ordered via Epic indicates pt should take an extra Lasix 40 mg  if weight gain for 2-3 lbs occur however RN request pt to contact her provider with such symptoms for confirmation with the exact weight that is gained (pt verbalized an understanding). Pt express how grateful she was for this RN case management to explain details on why such monitoring is importance and the risk involved if she does not manage these condition. Pt very receptive to the education provided today and has committed to participating in the [facility] program in managing her diabetes and HF. Pt is on Emmi and aware she will receive calls from this RN if red alerts are recognized. Pt again receptive and willing ot make changes to improve her health. A plan of care was generated around pt's needs and thoroughly discussed with interventions presented with pt willingness to participate.   PLAN: Will completed the initial HV next week and reiterated on today's discussed alone with review on this plan of care below. Will re-evaluate and adjust interventions accordingly based upon pt's progress. No other inquires or request at this time.   [facility] CM Care Plan Problem One     Most Recent Value  Care Plan Problem One  Diabetes management and knowledge deficit  Role Documenting the Problem One  Care Management Coordinator  Care Plan for Problem One  Active  [facility]N Long Term Goal   Pt will have an increase knowledge base of diabetes management within 70 days  [facility]N Long Term Goal Start Date  03/16/18  Interventions for Problem One Long Term Goal  Will educate on the importance of managing diabetes and why this is  importance due to the risk involved if not managed.   [facility] CM Short Term Goal #1   Pt will check her BS daily over the next 30 days.  [facility] CM Short Term Goal #1 Start Date  03/16/18  Interventions for Short Term Goal #1  Discussed the importance of daily check BS and the risk involved if not monitored.    [facility] CM Care Plan Problem Two     Most Recent Value  Care Plan Problem Two  Lack of knowledge related  HF  Role Documenting the Problem Two  Care Management Coordinator  Care Plan for Problem Two  Active  [facility]N CM Short Term Goal #1   Pt will have an increase knowledge base and complete her daily weight within the next 30 days.   [facility] CM Short Term Goal #1 Start Date  03/16/18  Interventions for Short Term Goal #2   Discussed HF zones and the importance of daily weights and fluid retention she be reported to her primary proivder      [name], RN Care Management Coordinator [place] Office [phone]

## 2018-03-16 NOTE — Patient Outreach (Signed)
[facility]) Care Management  03/16/2018  Larhonda Dettloff 1950/12/30 [id number]  Unsuccessful with outreach call today.  RN spoke with pt today and again introduced the [facility] program and verified identifiers. Pt indicated she was on her way home and again needed to recharge her phone and requested another call back for 4:30pm today. RN will again attempt another call for pending [facility] services.   [name], RN Care Management Coordinator [place] Office [phone]

## 2018-03-18 ENCOUNTER — Telehealth: Payer: Self-pay | Admitting: Pharmacist

## 2018-03-18 NOTE — Patient Outreach (Addendum)
[facility]) Care Management  03/18/2018  Stephanie [name] 02-Jul-1951 [id number]   Called patient regarding medication assistance and insulin managment. Unfortunately, she did not answer her phone. HIPAA compliant message was left on her voicemail.  Plan: Call patient back in 5-7 business days. Send patient an unsuccessful contact letter.  Elayne Guerin, PharmD, [place] Clinical Pharmacist [phone]

## 2018-03-22 ENCOUNTER — Encounter: Payer: Self-pay | Admitting: *Deleted

## 2018-03-22 ENCOUNTER — Other Ambulatory Visit: Payer: Self-pay | Admitting: *Deleted

## 2018-03-22 ENCOUNTER — Telehealth: Payer: Self-pay

## 2018-03-22 NOTE — Telephone Encounter (Signed)
This encounter was created in error - please disregard.

## 2018-03-22 NOTE — Telephone Encounter (Signed)
Patient changed her appointment from Friday to Wednesday 03-23-18.

## 2018-03-22 NOTE — Patient Outreach (Signed)
[facility]) Care Management   03/22/2018  Stephanie [name] June 23, 1951 [id number]  Stephanie [name] is an 67 y.o. female  Subjective:  DIABETES: Pt verbalized an understanding of her diabetes feel she did not get a full understanding of this condition and has not been monitoring her CBG with no glucometer on hand and has not been taking any prescribed insulin but taking Metforrmin. Although pt declined [facility] upon her recent discharge she had her personal reasons and was not pleased with this agency in the past. Pt presented supplies however all items were out dated with expiration dates. Pt receptive to referral for [facility] pharmacy. HF: Pt was ordered a scale via [facility] but has not been set up but pt willing to weigh daily. Pt receptive to ongoing education and willing to manage these conditions via [facility] assistance.  MEDICAL APPOINTMENTS: Pt states she has not been able to scheduled an appointment post-op hospital follow up with her cardiologist (Dr. [name]). Pt states she has called and left a message with Butch Penny however no call back since 5/28. MEDICATIONS: Pt reports she is confused about all her medications and has not been taking some that were reviewed today.    Objective:   Review of Systems  Constitutional: Negative.   HENT: Negative.   Eyes: Negative.   Respiratory: Negative.   Cardiovascular: Positive for leg swelling.       Bilateral edema to LE (history of HF) 2+  Gastrointestinal: Negative.   Genitourinary: Negative.   Musculoskeletal: Negative.   Skin: Negative.   Neurological: Negative.   Endo/Heme/Allergies: Negative.   Psychiatric/Behavioral: Negative.     Physical Exam  Constitutional: She is oriented to person, place, and time. She appears well-developed and well-nourished.  HENT:  Right Ear: External ear normal.  Left Ear: External ear normal.  Eyes: EOM are normal.  Neck: Normal range of motion.  Cardiovascular: Normal heart sounds.  Respiratory:  Effort normal and breath sounds normal.  GI: Soft. Bowel sounds are normal.  Musculoskeletal: Normal range of motion. She exhibits edema.  Bilateral edema 2+ LE.  Neurological: She is alert and oriented to person, place, and time.  Skin: Skin is warm and dry.  Psychiatric: She has a normal mood and affect. Her behavior is normal. Judgment and thought content normal.    Encounter Medications:   Outpatient Encounter Medications as of 03/22/2018  Medication Sig Note  . allopurinol (ZYLOPRIM) 300 MG tablet TAKE 300 MG BY MOUTH DAILY AS NEEDED FOR GOUT   . ALPRAZolam (XANAX) 1 MG tablet Take 1 tablet (1 mg total) by mouth at bedtime as needed for anxiety.   . carvedilol (COREG) 25 MG tablet TAKE 1 TABLET(25 MG) BY MOUTH TWICE DAILY   . CINNAMON PO Take 2 capsules by mouth daily.   . colchicine (COLCRYS) 0.6 MG tablet Take 1 tablet (0.6 mg total) by mouth daily as needed (GOUT). Reported on 01/21/2016   . dabigatran (PRADAXA) 150 MG CAPS capsule Take 1 capsule (150 mg total) by mouth 2 (two) times daily.   Marland Kitchen ENTRESTO 24-26 MG TAKE 1 TABLET BY MOUTH TWICE DAILY   . furosemide (LASIX) 40 MG tablet Take 1.5 tablets (60 mg total) by mouth daily. Weight daily and take additional lasix 40 mg for weight gain of 2-3 pounds   . gabapentin (NEURONTIN) 100 MG capsule Take 2 capsules (200 mg total) by mouth at bedtime.   . hydrocortisone valerate ointment (WESTCORT) 0.2 % Apply 1 application topically 2 (two) times daily.   Marland Kitchen  Insulin Glargine-Lixisenatide (SOLIQUA) 100-33 UNT-MCG/ML SOPN Inject 15 Units into the skin at bedtime. (Patient not taking: Reported on 03/10/2018)   . levothyroxine (SYNTHROID, LEVOTHROID) 50 MCG tablet Take 1 tablet (50 mcg total) by mouth daily before breakfast. (Patient not taking: Reported on 03/10/2018)   . potassium chloride SA (K-DUR,KLOR-CON) 20 MEQ tablet Take 1 tablet (20 mEq total) by mouth daily. (Patient taking differently: Take 20 mEq by mouth daily as needed (supplement). )  03/10/2018: Patient states she is taking sometimes.  Marland Kitchen PRESCRIPTION MEDICATION Take 1 tablet by mouth daily as needed (pain). "Disreuex" pain reliever sample given by MD - similar to Ibuprofen   . rosuvastatin (CRESTOR) 20 MG tablet Take 1 tablet (20 mg total) by mouth daily. (Patient not taking: Reported on 03/10/2018)   . spironolactone (ALDACTONE) 25 MG tablet Take 1 tablet (25 mg total) by mouth daily.    No facility-administered encounter medications on file as of 03/22/2018.     Functional Status:   In your present state of health, do you have any difficulty performing the following activities: 03/16/2018 03/02/2018  Hearing? N N  Vision? N N  Difficulty concentrating or making decisions? N N  Walking or climbing stairs? Y Y  Dressing or bathing? N N  Doing errands, shopping? N N  Preparing Food and eating ? N -  Using the Toilet? N -  In the past six months, have you accidently leaked urine? N -  Do you have problems with loss of bowel control? N -  Managing your Medications? Y -  Managing your Finances? N -  Housekeeping or managing your Housekeeping? N -  Some recent data might be hidden    Fall/Depression Screening:    Fall Risk  03/16/2018 03/30/2017 09/23/2015  Falls in the past year? No Yes No  Number falls in past yr: - 1 -  Injury with Fall? - No -  Follow up - Education provided -   [facility] 2/9 Scores 03/16/2018 03/30/2017 09/23/2015  PHQ - 2 Score 0 0 0   [facility] CM Care Plan Problem One     Most Recent Value  Care Plan Problem One  Diabetes management and knowledge deficit  Role Documenting the Problem One  Care Management Coordinator  Care Plan for Problem One  Active  THN Long Term Goal   Pt will have an increase knowledge base of diabetes management within 90 days  THN Long Term Goal Start Date  03/16/18  Interventions for Problem One Long Term Goal  Will prevent printer material for review for ongoing education on pt's diabetes and continue to encouraged adherence.  [facility]  CM Short Term Goal #1   Pt will check her BS daily over the next 30 days.  [facility] CM Short Term Goal #1 Start Date  03/16/18  Interventions for Short Term Goal #1  Will consult with provider for glucometer and update prescriptions on supplies withongoing encouragement.     [facility] CM Care Plan Problem Two     Most Recent Value  Care Plan Problem Two  Lack of knowledge related HF  Role Documenting the Problem Two  Care Management Coordinator  Care Plan for Problem Two  Active  [facility] CM Short Term Goal #1   Pt will have an increase knowledge base and complete her daily weight within the next 30 days.   [facility] CM Short Term Goal #1 Start Date  03/16/18  Interventions for Short Term Goal #2   Will set up mailed scales  provided to pt and request daily weights.      BP 130/88 (BP Location: Right Arm, Patient Position: Sitting, Cuff Size: Large)   Pulse 78   Resp 20   Ht 1.6 m (5\' 3" )   Wt 265 lb (120.2 kg)   SpO2 98%   BMI 46.94 kg/m  Assessment:   Consent for [facility] services Case Management related to Diabetes/HF education Adherence related medical appointment Adherence related to medication polypharmacy  Plan:  Will obtain a consent for [facility] services with pt understanding. Will reiterate on the previous education related to diabetes/HF and review the HF zones verified pt remains in the GREEN zone.  Will set up scales for daily weights and will request documentation via [facility] calendar for providers to view. Will discussed and review with ongoing education on diabetes and risk involved if not monitored. Will educate on printed EMMI material (Blood glucose monitoring, Diabetes: Why check your blood sugar, and How Type 2 diabetes can affect your body). Will strongly encouraged adherence with the generated plan of care, goal and interventions.  Will contact Dr. Olin Pia office and arrange an appointment (scheduled for Friday 3 pm that pt has agreed to with sufficient transportation-[facility]). Due to the only  available appointment will contact PA Tysinger and requested an earlier appointment due to same day rescheduled for (Wednesday 6/5 at 2 PM scheduled). Will strongly encouraged pt to contact [facility] today for the pending appointments this week.  Will review all medications via discharge sheet with several discrepancies. Will continue pt's provider Glade Lloyd, [place]) and spoke with nurse Jenny Reichmann concerning today's assessment. Will report vitals and request assistance with diabetic supplies for ongoing education in the home. Will also requested possible [facility] as pt declined previously for diabetic education on use of insulin. Also reported expiration dates on diabetic supplies and pt needs related to a glucometer, lancets, strips alone with confirmation on Aldactone that pt has not taken. Will also update involved [facility] pharmacy concerning pt's issues.  Will again reiterated on plan of care and goals along with interventions. No other inquires or request at this time. Provider will be sent home visit reports today.   [name], RN Care Management Coordinator [place] Office [phone]

## 2018-03-22 NOTE — Telephone Encounter (Signed)
Received call from [name] with Patient Outreach and she was at patients house for non compliance Diabetes she states that patient does not have meter, lancets, or strips to check sugars.  She is not taking any insulin.  Only is taking Metformin and Lasix.  Patient has CHF with 2 plus edema in legs and feet not taking aldactone.  Does have diabetic ulcers pt is aware of.  Patient is willing to have [place] come out to help if we will order.    BP 130/88 O2 98.Lattie Haw wants [place] to verify medication and make sure she understands how to take them on her follow up appointment on Friday 03-25-18

## 2018-03-23 ENCOUNTER — Ambulatory Visit (INDEPENDENT_AMBULATORY_CARE_PROVIDER_SITE_OTHER): Payer: Medicare Other | Admitting: Medical

## 2018-03-23 ENCOUNTER — Encounter: Payer: Self-pay | Admitting: Medical

## 2018-03-23 VITALS — BP 136/84 | HR 64 | Resp 18 | Ht 63.0 in | Wt 274.0 lb

## 2018-03-23 DIAGNOSIS — I872 Venous insufficiency (chronic) (peripheral): Secondary | ICD-10-CM

## 2018-03-23 DIAGNOSIS — E785 Hyperlipidemia, unspecified: Secondary | ICD-10-CM | POA: Diagnosis not present

## 2018-03-23 DIAGNOSIS — T50905A Adverse effect of unspecified drugs, medicaments and biological substances, initial encounter: Secondary | ICD-10-CM | POA: Diagnosis not present

## 2018-03-23 DIAGNOSIS — Z6841 Body Mass Index (BMI) 40.0 and over, adult: Secondary | ICD-10-CM

## 2018-03-23 DIAGNOSIS — I87309 Chronic venous hypertension (idiopathic) without complications of unspecified lower extremity: Secondary | ICD-10-CM | POA: Diagnosis not present

## 2018-03-23 DIAGNOSIS — I5032 Chronic diastolic (congestive) heart failure: Secondary | ICD-10-CM | POA: Diagnosis not present

## 2018-03-23 DIAGNOSIS — M1A9XX0 Chronic gout, unspecified, without tophus (tophi): Secondary | ICD-10-CM

## 2018-03-23 DIAGNOSIS — E118 Type 2 diabetes mellitus with unspecified complications: Secondary | ICD-10-CM | POA: Diagnosis not present

## 2018-03-23 DIAGNOSIS — I10 Essential (primary) hypertension: Secondary | ICD-10-CM | POA: Diagnosis not present

## 2018-03-23 DIAGNOSIS — R7989 Other specified abnormal findings of blood chemistry: Secondary | ICD-10-CM | POA: Diagnosis not present

## 2018-03-23 DIAGNOSIS — E876 Hypokalemia: Secondary | ICD-10-CM | POA: Diagnosis not present

## 2018-03-23 DIAGNOSIS — M170 Bilateral primary osteoarthritis of knee: Secondary | ICD-10-CM | POA: Diagnosis not present

## 2018-03-23 LAB — POCT GLYCOSYLATED HEMOGLOBIN (HGB A1C): Hemoglobin A1C: 6.4 % — AB (ref 4.0–5.6)

## 2018-03-23 NOTE — Telephone Encounter (Signed)
Sent message to RN @ [place].

## 2018-03-23 NOTE — Addendum Note (Signed)
Addended by: Carlena Hurl on: 03/23/2018 03:58 PM   Modules accepted: Level of Service

## 2018-03-23 NOTE — Patient Instructions (Addendum)
After our discussion today, we have simplified your medication list, I called to stop your other refills at [facility], I have reconciled your medications with [facility], and asked [facility] to change to 30-day supply when you do call for refills  You will need to call [facility] before you run out of your current medications    The following is your new updated list of medications:  Allopurinol 300mg  daily Carvedilol/Coreg 25 mg twice daily Pradaxa/dabigatran 150 mg twice daily Entresto/sacubitril/valsartan 24/26 mg twice daily  Furosemide/Lasix 40 mg, 1.5 tablets daily.   Potassium/K-Dur 20 M EQ daily  Gabapentin 100 mg, 2 capsules at bedtime daily Pennsaid topical cream Tylenol 500mg  up to twice daily over the counter for pain as needed Cinnamon 500mg  daily over the counter Xanax as needed    Here is your current medication list and recommendations   Diabetes  Check your sugar in the morning fasting daily and write these numbers down in a logbook so we can see them when you come in  If you are getting low on test strips or lancets please call before you run out   You should walk every day for exercise, with a goal of at least 45 minutes a day.  If you are not currently doing this, start walking at least 15 minutes a day.  Add on 5 minutes of walking per week gradually increasing your exercise tolerance  As of today we will hold off on any medications for diabetes since your diabetes marker looked good today off medication  Obesity  A year ago your baseline weight was 256  In addition to fluid and heart failure, you have gained weight in recent months  Your current baseline weight seems to be 260lb.     Work on exercising, eating healthy and trying to get back down to 256lb as an initial goal   High cholesterol  We will hold off on medication for now given side effects you had with Crestor  Gout  Take allopurinol 300 mg daily in the morning for  prevention   High blood pressure, A. fib,  and Heart Disease  Take carvedilol/Coreg 25 mg twice daily  Take Pradaxa/dabigatran 150 mg twice daily  Take Entresto/sacubitril/valsartan 24/26 mg twice daily   You should limit salt to 2 g total daily  You should limit your water intake to less than 1.2 L a day   Heart failure and swelling  Please weigh yourself every day to see if you are having fluid weight gain  Follow up with Cardiology soon as planned.  You are suppose to have a Holter monitor test  Take furosemide/Lasix 40 mg, 1.5 tablets daily.    If you have 2 to 3 pound weight gain over the course of a day or 2, then take an additional Lasix 40 mg daily until your weight is back down to baseline  Take potassium/K-Dur 20 M EQ daily along with your Lasix.  Thyroid disease  Since you have side effects with the thyroid medicine and are not currently taking this, let us discontinue this for the time being   Neuropathy, chronic pain  Take gabapentin 100 mg, 2 capsules at bedtime daily   Chronic kidney disease  It is important to take your medications as prescribed daily to prevent further damage to the kidneys by high blood pressure diabetes  Avoid ibuprofen, Aleve, Advil, Motrin, or similar medications as they can damage the kidney   Sleep issues  In the past you have had poor  sleep hygiene, inconsistent sleep schedule  This is something you need to work on  I recommend going to bed at the same time every day and plan to get up at the same time every day  Do not watch TV or read in the bed  Do not eat or drink within an hour at bedtime   Anxiety/worry  I recommend you see a counselor    The following medications are discontinued today due to either duplication in medication, side effects, confusion, or the fact you haven't been taking them.  Levothyroxine/Synthroid  Spironolactone  Metformin  Soliqua  pen  Crestor/rosuvastatin  Colchicine  Trazodone  Ibuprofen  Duexis  Metoprolol

## 2018-03-23 NOTE — Telephone Encounter (Signed)
Please call home health.  It may be helpful to send him a copy of my after visit summary and my office visit note today as we painstakingly went over her medications today and try to simplify things.  She had not been taking her Lasix as prescribed.  She says she is never been on spironolactone although this been prescribed numerous times in the past.  My request for home health for this coming Friday appointment would be to review the after visit summary again with her, to make sure the medications are clear at home now, to double check her weight and swelling at that visit, and to help [place] make sure she is on track with the current recommendations.

## 2018-03-23 NOTE — Progress Notes (Signed)
Subjective: Chief Complaint  Patient presents with  . Diabetes    med check. Patient does bring all meds with her today. Patient states that she does have AVS from last visit to refer to. Patient states that she is having some leg swelling. Dryness and flaking on hands and arms.    Here today at my request to review her medications, to verify her pill bottles, and to come up with a plan to make things less confusing.  She still has concerns about blotchy brown skin lesions of her legs and swelling.  Of note after nurse reviewed pill bottles and medications and after I reviewed her medicines as well, there are things that have been discontinued in the past that she still is taking, she is not taking medicine she is supposed to be taking, and she has numerous bottles of several medications from auto refill from the pharmacy as well as back-up supply of medicine where she was noncompliant at times  Some medication she had with her she had never started before  Specifically she has only been taking Lasix 1 tablet a day instead of 1.5 tablets a day.  She has not been taking Crestor due to aches and pains in her legs.  She refuses to take this particular medication  She has not been taking levothyroxine as she says it makes her feel sick, gets her belly pains, and she feels lousy for at least an hour after taking it.  She says she has never taken Spironolactone  She has samples of different oral NSAIDs that she has been taking which helps her joint pains.  She also uses Pennsaid topical gel for joint pains  She recently started back on gabapentin but had not been taking this prior.  She says this is helping her burning sensations in her legs and helped her sleep  She  was still taking metoprolol and Coreg and losartan despite this being reviewed last visit    Objective BP 136/84   Pulse 64   Resp 18   Ht 5\' 3"  (1.6 m)   Wt 274 lb (124.3 kg)   BMI 48.54 kg/m   Wt Readings from Last 3  Encounters:  03/23/18 274 lb (124.3 kg)  03/22/18 265 lb (120.2 kg)  03/09/18 270 lb (122.5 kg)   Gen: wd, wn, nad 1+ nonpitting LE edema Healing brown roundish lesions of lower legs, 3-6 mm diameter    Assessment: Encounter Diagnoses  Name Primary?  . Type 2 diabetes mellitus with complication, without long-term current use of insulin ([place]) Yes  . CHRONIC DIASTOLIC HEART FAILURE   . Chronic venous hypertension, unspecified laterality   . Essential hypertension   . Hypokalemia   . Venous stasis dermatitis, unspecified laterality   . Primary osteoarthritis of both knees   . Dyslipidemia   . Class 3 severe obesity due to excess calories with serious comorbidity and body mass index (BMI) of 40.0 to 44.9 in adult [facility])   . Chronic gout without tophus, unspecified cause, unspecified site   . Abnormal thyroid blood test   . Medication side effect, initial encounter      Plan: I reviewed her pill bottles and medications today, I looked through the mountain and the medication she had today.  After discussing side effects on certain medications, concerns, also considering what would be helpful to simplify her regimen, we came up with the following list of medications below.  I gave her this list to take to her pharmacist and  to show home health when they come back out.  Home health just came out recently to help [place] with her care  Although the regimen below is not necessarily the best regimen, I think it will help simplify things for the short-term until she gets used to this regimen.  I personally called [facility] to stop auto refills and to clarify medications that have been discontinued as she keeps getting refills on those as well.  I also asked that we go to 30 day supply now to simplify things.  She is aware that she will need to call pharmacy before next refill as she has many duplicates of medications.   For example she had over 5 boxes of Entresto, 2 full bottles of Coreg, 2 full  bottles of Gabapentin, etc.   She will be seeing cardiology soon.  I asked her to make sure they are aware of the medicine reconciliation today to get a true representation of her current med list  She seems to be comfortable with plan today.     Future concerns: Plan thyroid lab recheck in 1 month.   She didn't tolerate levothyroxine. She may need to have spironolactone added back for edema and CHF.   She may need diabetes medication added back, but for now diet controlled She didn't tolerate Crestor.   We can revisit statin next visit.      Patient Instructions  After our discussion today, we have simplified your medication list, I called to stop your other refills at [facility], I have reconciled your medications with [facility], and asked [facility] to change to 30-day supply when you do call for refills  You will need to call [facility] before you run out of your current medications    The following is your new updated list of medications:  Allopurinol 300mg  daily Carvedilol/Coreg 25 mg twice daily Pradaxa/dabigatran 150 mg twice daily Entresto/sacubitril/valsartan 24/26 mg twice daily  Furosemide/Lasix 40 mg, 1.5 tablets daily.   Potassium/K-Dur 20 M EQ daily  Gabapentin 100 mg, 2 capsules at bedtime daily Pennsaid topical cream Tylenol 500mg  up to twice daily over the counter for pain as needed Cinnamon 500mg  daily over the counter Xanax as needed    Here is your current medication list and recommendations   Diabetes  Check your sugar in the morning fasting daily and write these numbers down in a logbook so we can see them when you come in  If you are getting low on test strips or lancets please call before you run out   You should walk every day for exercise, with a goal of at least 45 minutes a day.  If you are not currently doing this, start walking at least 15 minutes a day.  Add on 5 minutes of walking per week gradually increasing your exercise tolerance  As  of today we will hold off on any medications for diabetes since your diabetes marker looked good today off medication  Obesity  A year ago your baseline weight was 256  In addition to fluid and heart failure, you have gained weight in recent months  Your current baseline weight seems to be 260lb.     Work on exercising, eating healthy and trying to get back down to 256lb as an initial goal   High cholesterol  We will hold off on medication for now given side effects you had with Crestor  Gout  Take allopurinol 300 mg daily in the morning for prevention   High blood pressure, A.  fib,  and Heart Disease  Take carvedilol/Coreg 25 mg twice daily  Take Pradaxa/dabigatran 150 mg twice daily  Take Entresto/sacubitril/valsartan 24/26 mg twice daily   You should limit salt to 2 g total daily  You should limit your water intake to less than 1.2 L a day   Heart failure and swelling  Please weigh yourself every day to see if you are having fluid weight gain  Follow up with Cardiology soon as planned.  You are suppose to have a Holter monitor test  Take furosemide/Lasix 40 mg, 1.5 tablets daily.    If you have 2 to 3 pound weight gain over the course of a day or 2, then take an additional Lasix 40 mg daily until your weight is back down to baseline  Take potassium/K-Dur 20 M EQ daily along with your Lasix.  Thyroid disease  Since you have side effects with the thyroid medicine and are not currently taking this, let us discontinue this for the time being   Neuropathy, chronic pain  Take gabapentin 100 mg, 2 capsules at bedtime daily   Chronic kidney disease  It is important to take your medications as prescribed daily to prevent further damage to the kidneys by high blood pressure diabetes  Avoid ibuprofen, Aleve, Advil, Motrin, or similar medications as they can damage the kidney   Sleep issues  In the past you have had poor sleep hygiene, inconsistent  sleep schedule  This is something you need to work on  I recommend going to bed at the same time every day and plan to get up at the same time every day  Do not watch TV or read in the bed  Do not eat or drink within an hour at bedtime   Anxiety/worry  I recommend you see a counselor    The following medications are discontinued today due to either duplication in medication, side effects, confusion, or the fact you haven't been taking them.  Levothyroxine/Synthroid  Spironolactone  Metformin  Soliqua pen  Crestor/rosuvastatin  Colchicine  Trazodone  Ibuprofen  Duexis  Metoprolol   Spent > 60 minutes face to face with patient in discussion of symptoms, evaluation, plan and recommendations.

## 2018-03-24 ENCOUNTER — Other Ambulatory Visit: Payer: Self-pay | Admitting: Pharmacist

## 2018-03-24 NOTE — Patient Outreach (Addendum)
[facility]) Care Management  [place]   03/24/2018  Briggett Tuccillo 1950-12-03 [id number]  Subjective:  Patient was called regarding medication management per referral.  HIPAA identifiers were obtained.  Patient is a 67 year old female with multiple medical conditions including but not limited to:  Afib, CHF, hyperlipidemia, anxiety, gout, type 2 diabetes, venous stasis dermatitis, and thyroid disease.  Patient stated she just saw her primary care provider, Chana Bode, PA-C and feels like she understands her medications.  However, she was confused by the medication list she received from her office visit because several of the medications she was directed to discontinue were still listed on her active medication list.   Objective:  HgA1c - 6.4% Scr 1.09 K-4.6 LDL-129 HDL-53 TG-76  Encounter Medications: Outpatient Encounter Medications as of 03/24/2018  Medication Sig Note  . allopurinol (ZYLOPRIM) 300 MG tablet TAKE 300 MG BY MOUTH DAILY AS NEEDED FOR GOUT   . ALPRAZolam (XANAX) 1 MG tablet Take 1 tablet (1 mg total) by mouth at bedtime as needed for anxiety. 03/23/2018: As needed   . carvedilol (COREG) 25 MG tablet TAKE 1 TABLET(25 MG) BY MOUTH TWICE DAILY   . CINNAMON PO Take 2 capsules by mouth daily.   . colchicine (COLCRYS) 0.6 MG tablet Take 1 tablet (0.6 mg total) by mouth daily as needed (GOUT). Reported on 01/21/2016   . dabigatran (PRADAXA) 150 MG CAPS capsule Take 1 capsule (150 mg total) by mouth 2 (two) times daily.   . Diclofenac Sodium (PENNSAID) 2 % SOLN Use on knees topically as needed for pain   . ENTRESTO 24-26 MG TAKE 1 TABLET BY MOUTH TWICE DAILY   . furosemide (LASIX) 40 MG tablet Take 1.5 tablets (60 mg total) by mouth daily. Weight daily and take additional lasix 40 mg for weight gain of 2-3 pounds   . gabapentin (NEURONTIN) 100 MG capsule Take 2 capsules (200 mg total) by mouth at bedtime.   . hydrocortisone valerate ointment  (WESTCORT) 0.2 % Apply 1 application topically 2 (two) times daily.   . potassium chloride SA (K-DUR,KLOR-CON) 20 MEQ tablet Take 1 tablet (20 mEq total) by mouth daily. (Patient taking differently: Take 20 mEq by mouth daily as needed (supplement). ) 03/10/2018: Patient states she is taking sometimes.  . terbinafine (LAMISIL) 1 % cream Apply 1 application topically 2 (two) times daily.   . [DISCONTINUED] ibuprofen (ADVIL,MOTRIN) 200 MG tablet Take 200 mg by mouth every 6 (six) hours as needed.   . [DISCONTINUED] Ibuprofen-Famotidine (DUEXIS) 800-26.6 MG TABS Take 1 tablet by mouth as needed. 03/23/2018: Maybe 1-2 times a week  . [DISCONTINUED] Insulin Glargine-Lixisenatide (SOLIQUA) 100-33 UNT-MCG/ML SOPN Inject 15 Units into the skin at bedtime. (Patient not taking: Reported on 03/10/2018)   . [DISCONTINUED] levothyroxine (SYNTHROID, LEVOTHROID) 50 MCG tablet Take 1 tablet (50 mcg total) by mouth daily before breakfast. (Patient not taking: Reported on 03/10/2018)   . [DISCONTINUED] losartan (COZAAR) 25 MG tablet Take 25 mg by mouth daily.   . [DISCONTINUED] metFORMIN (GLUCOPHAGE) 500 MG tablet Take by mouth 2 (two) times daily with a meal.   . [DISCONTINUED] metoprolol succinate (TOPROL-XL) 50 MG 24 hr tablet Take 50 mg by mouth 2 (two) times daily. Take with or immediately following a meal.   . [DISCONTINUED] rosuvastatin (CRESTOR) 20 MG tablet Take 1 tablet (20 mg total) by mouth daily. (Patient not taking: Reported on 03/10/2018)   . [DISCONTINUED] spironolactone (ALDACTONE) 25 MG tablet Take 1 tablet (25  mg total) by mouth daily. (Patient not taking: Reported on 03/22/2018)   . [DISCONTINUED] traZODone (DESYREL) 50 MG tablet Take 50 mg by mouth at bedtime.    No facility-administered encounter medications on file as of 03/24/2018.     Functional Status: In your present state of health, do you have any difficulty performing the following activities: 03/16/2018 03/02/2018  Hearing? N N  Vision? N N   Difficulty concentrating or making decisions? N N  Walking or climbing stairs? Y Y  Dressing or bathing? N N  Doing errands, shopping? N N  Preparing Food and eating ? N -  Using the Toilet? N -  In the past six months, have you accidently leaked urine? N -  Do you have problems with loss of bowel control? N -  Managing your Medications? Y -  Managing your Finances? N -  Housekeeping or managing your Housekeeping? N -  Some recent data might be hidden    Fall/Depression Screening: Fall Risk  03/16/2018 03/30/2017 09/23/2015  Falls in the past year? No Yes No  Number falls in past yr: - 1 -  Injury with Fall? - No -  Follow up - Education provided -   [facility] 2/9 Scores 03/16/2018 03/30/2017 09/23/2015  PHQ - 2 Score 0 0 0      Assessment:  Patient's medications were reviewed via telephone. In addition to reviewing the patient's medication, the office visit not from Chana Bode, PA-C was reviewed.   Drugs sorted by system:  Neurologic/Psychologic: Alprazolam Gabapentin   Cardiovascular: Carvedilol Pradaxa Entresto   Endocrine: Allopurinol Colchicine  Topical: Pennsaid Terbinafine Hydrocortisone ointment  Vitamins/Minerals: Cinnamon Potassium Chloride    Medications discontinued by Chana Bode, PA-C during the 03/23/18 office visit with notation that some may need to be restarted based on labs:   Levothyroxine/Synthroid  Spironolactone  Metformin  Soliqua pen  Crestor/rosuvastatin  Colchicine  Trazodone  Ibuprofen  Duexis  Metoprolol   Although the medications listed above where discontinued, several remained on the patient's medication list. The patient said she was told to take her active medication list to her local pharmacy so they would be able to discontinue medications she is not taking. She was confused because several of the medications that were discontinued were still on her medication list.  Since the medications have been  discontinued the following medications were removed from her medication list in an effort to decrease confusion:  Metoprolol, metformin, spironolactone, trazodone, Duexis, rosuvastatin, levothyroxine.  Colchicine was listed as discontinued.  Patient said she is still planning to take Colchicine for gout flare-ups.   New Prescription Needed: Patient was instructed to test her blood sugar and record it in a log book. Patient reported she does not have a glucometer. Note will be sent to the patient's provider requesting a new prescription be sent to the patient's pharmacy for diabetic testing supplies   Plan: -route note to patient's PCP -route note to cardiology since patient has an upcoming visit tomorrow. -Follow up with the patient next Monday -Assist her with communicating her active medication list with [facility]   Elayne Guerin, PharmD, [place] Clinical Pharmacist [phone]

## 2018-03-25 ENCOUNTER — Encounter: Payer: Self-pay | Admitting: Medical

## 2018-03-25 ENCOUNTER — Ambulatory Visit (INDEPENDENT_AMBULATORY_CARE_PROVIDER_SITE_OTHER): Payer: Medicare Other | Admitting: Internal Medicine

## 2018-03-25 ENCOUNTER — Encounter: Payer: Self-pay | Admitting: Internal Medicine

## 2018-03-25 VITALS — BP 142/80 | HR 61 | Ht 63.0 in | Wt 271.8 lb

## 2018-03-25 DIAGNOSIS — I5022 Chronic systolic (congestive) heart failure: Secondary | ICD-10-CM | POA: Diagnosis not present

## 2018-03-25 DIAGNOSIS — I420 Dilated cardiomyopathy: Secondary | ICD-10-CM | POA: Diagnosis not present

## 2018-03-25 DIAGNOSIS — I428 Other cardiomyopathies: Secondary | ICD-10-CM | POA: Diagnosis not present

## 2018-03-25 MED ORDER — SPIRONOLACTONE 25 MG PO TABS: 25.0000 mg | ORAL_TABLET | Freq: Every day | ORAL | 3 refills | Status: DC

## 2018-03-25 NOTE — Progress Notes (Signed)
Patient Care Team: Tysinger, Camelia Eng, PA-C as PCP - General (Family Medicine) Tobi Bastos, RN as [place] Management Elayne Guerin, [facility] as [place] Management (Pharmacist)   HPI  Stephanie [name] is a 67 y.o. female Seen in followup for atrial fibrillation in the context of morbid obesity, sarcoid and mild left ventricular hypertrophy. Catheterization 2012 demonstrated nonobstructive coronary disease and normal left ventricular function and elevated filling pressures By echocardiogram, recurrent cardiomyopathy was demonstrated with EF 25-30% April 2014   She underwent cardioversion may/14 with the hopes of restoring sinus rhythm and seeing recovery of LV systolic function. She had rapidly reverted to afib and then spontaneously reverted to sinus rhythm. There has been  interval near normalization of LV systolic function confirmed by echo August 2014   She saw Dr. Greggory Brandy 3/15 to consider catheter ablation.  She was not interested. He reduced her amiodarone to 200 mg a day. Encouraged her to refrain from alcohol and to work on blood pressure and weight reduction.  She had a sleep study 2014 with an AHI of 7.5    She developed hyperthyroidism on amiodarone prompting its discontinuation. She has not been able to forge a working relationship with endo, bringing in internet reviews of Dr G     DATE TEST EF   3/12    Echo     25 %   8/14 Echo  55-60 %   12/17 Echo  25-30% LAE (47/2.0/45)  5/19 Echo 15-20%        .  Date TSH Cr K Hgb  6/17 1.9     1/18 <<0.01     3/18 2.2     6/18 7.4 1.1  12.6  5/19  1.09 4.6 11.1      .   Past Medical History:  Diagnosis Date  . Anemia   . Ankle fracture, right    x 2  . Antineoplastic and immunosuppressive drugs causing adverse effect in therapeutic use   . Asthma   . Chronic venous hypertension without complications   . Colon polyps 2009  . Coronary artery disease    no CAD by cath  11.2010  . Depressive disorder, not elsewhere classified   . Diabetes mellitus type 2 in obese (HCC)    hgb AIC 6.1 09/02/2009  . Diastolic CHF, chronic ([place])   . Diverticulosis of colon 2009   colonoscopy  . Gout   . Hepatitis, unspecified    hx of  . Hx of hysterectomy 2002  . Hyperlipidemia   . Hypertension   . Hypothyroidism   . Irritable bowel syndrome   . Lung nodule    60mm RLL nodule on CT Chest  07/2009, resolved by 2011 CT  . Noncompliance   . Obesity   . Persistent atrial fibrillation ([place])    s/p DCCV 07/2009; Pradaxa Rx  . PONV (postoperative nausea and vomiting)   . Sarcoidosis    dx by eye exam  . Sleep disturbance 06/2013   sleep study, mild abnormality, no criteria for CPAP    Past Surgical History:  Procedure Laterality Date  . ABDOMINAL HYSTERECTOMY    . CARDIOVERSION     x 2  . CARDIOVERSION N/A 02/27/2013   Procedure: CARDIOVERSION;  Surgeon: Lelon Perla, MD;  Location: [facility] ENDOSCOPY;  Service: Cardiovascular;  Laterality: N/A;  . CARDIOVERSION N/A 03/24/2017   Procedure: CARDIOVERSION;  Surgeon: Josue Hector, MD;  Location: [place] ENDOSCOPY;  Service: Cardiovascular;  Laterality: N/A;  . CHOLECYSTECTOMY    . COLONOSCOPY     Diverticulosis, colon polyps, repeat 2014, Dr. Deatra Ina  . ESOPHAGOGASTRODUODENOSCOPY N/A 05/16/2014   Procedure: ESOPHAGOGASTRODUODENOSCOPY (EGD);  Surgeon: Wonda Horner, MD;  Location: [facility] ORS;  Service: Gastroenterology;  Laterality: N/A;  . HERNIA REPAIR  2009    Current Outpatient Medications  Medication Sig Dispense Refill  . allopurinol (ZYLOPRIM) 300 MG tablet TAKE 300 MG BY MOUTH DAILY AS NEEDED FOR GOUT 90 tablet 2  . ALPRAZolam (XANAX) 1 MG tablet Take 1 tablet (1 mg total) by mouth at bedtime as needed for anxiety. 45 tablet 1  . carvedilol (COREG) 25 MG tablet TAKE 1 TABLET(25 MG) BY MOUTH TWICE DAILY 180 tablet 1  . CINNAMON PO Take 2 capsules by mouth daily.    . colchicine (COLCRYS) 0.6 MG tablet Take 1 tablet  (0.6 mg total) by mouth daily as needed (GOUT). Reported on 01/21/2016 30 tablet 1  . dabigatran (PRADAXA) 150 MG CAPS capsule Take 1 capsule (150 mg total) by mouth 2 (two) times daily. 60 capsule 5  . Diclofenac Sodium (PENNSAID) 2 % SOLN Use on knees topically as needed for pain    . ENTRESTO 24-26 MG TAKE 1 TABLET BY MOUTH TWICE DAILY 180 tablet 0  . furosemide (LASIX) 40 MG tablet Take 1.5 tablets (60 mg total) by mouth daily. Weight daily and take additional lasix 40 mg for weight gain of 2-3 pounds 45 tablet 6  . gabapentin (NEURONTIN) 100 MG capsule Take 2 capsules (200 mg total) by mouth at bedtime. 60 capsule 3  . hydrocortisone valerate ointment (WESTCORT) 0.2 % Apply 1 application topically 2 (two) times daily. 45 g 0  . potassium chloride SA (K-DUR,KLOR-CON) 20 MEQ tablet Take 1 tablet (20 mEq total) by mouth daily. (Patient taking differently: Take 20 mEq by mouth daily as needed (supplement). ) 90 tablet 3  . terbinafine (LAMISIL) 1 % cream Apply 1 application topically 2 (two) times daily.     No current facility-administered medications for this visit.     Allergies  Allergen Reactions  . Amiodarone Other (See Comments)    adverse reaction: Tremor/gait disurbance  . Atorvastatin Other (See Comments)    Body aches and pains--stiffness.   [name] [Pitavastatin]     Body aches  . Propoxyphene N-Acetaminophen Nausea And Vomiting  . Albuterol Palpitations    Palpitations, intolerance    Review of Systems negative except from HPI and PMH  Physical Exam BP (!) 142/80   Pulse 61   Ht 5\' 3"  (1.6 m)   Wt 271 lb 12.8 oz (123.3 kg)   SpO2 99%   BMI 48.15 kg/m  Well developed and nourished in no acute distress HENT normal Neck supple with JVP-8 Carotids brisk and full without bruits Clear Irregularly irregular rate and rhythm with rapid ventricular response, no murmurs or gallops Abd-soft with active BS without hepatomegaly No Clubbing cyanosis 1-2+ thank you for the  edema Skin-warm and dry A & Oriented  Grossly normal sensory and motor function      Assessment and  Plan  Hypertension     Atrial fibrillation-long standing persistent    Cardiomyopathy non ischemic   Congestive heart failure  Class 3  Sarcoid  Obesity    Tremor   Hyperthyroidism >> now hypothyroidism   She is euthyroid.  Her atrial fibrillation rates are fast.  Is may be contributing to her cardiomyopathy.  We will undertake a 48-hour  monitor to evaluate this.  She is volume overloaded.  She has not been taking her medications as prescribed.  We have reviewed this.  She will resume furosemide 60 daily.  She is concerned about renal insufficiency.  I told her her most recent creatinine was normal and she should not be concerned about this.  We will also reinitiate spironolactone.  We discussed also the potential role of ablation of the AV node and pacemaker implantation.  As soon as I mentioned pacemaker she started shaking her head no.  We will try to understand this, she related a series of consequences related to an abdominal surgery with recurrent infections.  We spent more than 50% of our >25 min visit in face to face counseling regarding the above

## 2018-03-25 NOTE — Progress Notes (Signed)
pls cal out glucometer, strips, lancets.   #1 glucometer, #100 on lancets/strips.

## 2018-03-25 NOTE — Patient Instructions (Signed)
Medication Instructions:  Your physician has recommended you make the following change in your medication:   1. Take your Lasix daily and take additional lasix, 40mg , for a 2-3lb weight gain. 2. Begin Spironolactone  3. Discontinue your Potassium supplement   Labwork: Your physician recommends that you return for lab work in: BMP in 2 weeks   Testing/Procedures: Your physician has recommended that you wear a holter monitor. Holter monitors are medical devices that record the heart's electrical activity. Doctors most often use these monitors to diagnose arrhythmias. Arrhythmias are problems with the speed or rhythm of the heartbeat. The monitor is a small, portable device. You can wear one while you do your normal daily activities. This is usually used to diagnose what is causing palpitations/syncope (passing out).   Follow-Up: Your physician recommends that you schedule a follow-up appointment in:  8-10 weeks with Dr [name].    Any Other Special Instructions Will Be Listed Below (If Applicable).     If you need a refill on your cardiac medications before your next appointment, please call your pharmacy.

## 2018-03-28 ENCOUNTER — Encounter: Payer: Self-pay | Admitting: *Deleted

## 2018-03-28 ENCOUNTER — Other Ambulatory Visit: Payer: Self-pay | Admitting: *Deleted

## 2018-03-28 ENCOUNTER — Other Ambulatory Visit: Payer: Self-pay

## 2018-03-28 ENCOUNTER — Telehealth: Payer: Self-pay | Admitting: Medical

## 2018-03-28 ENCOUNTER — Other Ambulatory Visit: Payer: Self-pay | Admitting: Pharmacist

## 2018-03-28 MED ORDER — MICROLET LANCETS MISC: 1 refills | Status: DC

## 2018-03-28 MED ORDER — CONTOUR NEXT EZ W/DEVICE KIT: 1.0000 | PACK | Freq: Two times a day (BID) | 0 refills | Status: AC

## 2018-03-28 MED ORDER — GLUCOSE BLOOD VI STRP: ORAL_STRIP | 11 refills | Status: DC

## 2018-03-28 MED ORDER — GLUCOSE BLOOD VI STRP: ORAL_STRIP | 1 refills | Status: DC

## 2018-03-28 MED ORDER — MICROLET LANCETS MISC: 11 refills | Status: DC

## 2018-03-28 NOTE — Telephone Encounter (Signed)
Spoke with patient today. She had the Spironolactone filled and is taking. Dr. [name] discontinued Potassium and she was still taking Potassium.  She was instructed to follow his instructions since he restarted Spironolactone and her last K was 4.6.  Thank you for agreeing to send the glucometer and supplies. A [facility] Nurse will go out to the patient's home and teach her how to use the meter.     Stephanie [name], PharmD, [place] Clinical Pharmacist [phone]

## 2018-03-28 NOTE — Progress Notes (Signed)
Please advise 

## 2018-03-28 NOTE — Patient Outreach (Signed)
[facility]) Care Management  03/28/2018  Jenniffer Vessels 06/17/1951 [id number]    Telephone Assessment (Unsuccessful)  RN attempted a follow up with pt today to inquire on pending appointments for last week and pt's ongoing management of care however only able to leave a HIPAA approvded voice message requesting a call back. Will rescheduled another follow up call later this week for ongoing [facility] services.   [name], RN Care Management Coordinator [place] Office [phone]

## 2018-03-28 NOTE — Patient Outreach (Addendum)
[facility]) Care Management  03/28/2018  Stephanie [name] Apr 30, 1951 [id number]   Patient was called to follow up  on medication management. HIPAA identifiers were obtained. Patient's chart was reviewed and the medication changes from her appointment with Dr. [name] were discussed.  At her visit with Dr. [name], Spironolactone 25 mg 1 tablet daily was restarted and potassium Chloride was discontinued.  Patient said she was unaware of the potassium discontinuation.(Her medication list was reviewed again and patient said she was going to remove potassium from her active medications.)  Hypothyroid-patient's TSH was 6.97 03/01/18. Lab annotations said her TSH was to be increased to Levothyroxine 50 mcg by Dr. Hulan Saas.  Patient is not currently taking Levothyroxine or any other therapy for hypothyroidism.  Medications Reviewed Today    Reviewed by Elayne Guerin, [facility] (Pharmacist) on 03/28/18 at 1355  Med List Status: <None>  Medication Order Taking? Sig Documenting Provider Last Dose Status Informant  allopurinol (ZYLOPRIM) 300 MG tablet [id number] Yes TAKE 300 MG BY MOUTH DAILY AS NEEDED FOR GOUT Tysinger, Camelia Eng, PA-C Taking Active Self  ALPRAZolam Duanne Moron) 1 MG tablet [id number] Yes Take 1 tablet (1 mg total) by mouth at bedtime as needed for anxiety. Carlena Hurl, PA-C Taking Active Self           Med Note Sandford Craze   Fri Mar 25, 2018  2:59 PM)    carvedilol (COREG) 25 MG tablet [id number] Yes TAKE 1 TABLET(25 MG) BY MOUTH TWICE DAILY Caryl Ada Taking Active Self  CINNAMON PO [id number] Yes Take 2 capsules by mouth daily. [provider] Taking Active Self  colchicine (COLCRYS) 0.6 MG tablet [id number] Yes Take 1 tablet (0.6 mg total) by mouth daily as needed (GOUT). Reported on 01/21/2016 Carlena Hurl, PA-C Taking Active Self  dabigatran (PRADAXA) 150 MG CAPS capsule [id number] Yes Take 1 capsule (150 mg total) by mouth 2 (two) times daily.  Deboraha Sprang, MD Taking Active Self  Diclofenac Sodium (PENNSAID) 2 % SOLN [id number] Yes Use on knees topically as needed for pain [provider] Taking Active   ENTRESTO 24-26 MG [id number] Yes TAKE 1 TABLET BY MOUTH TWICE DAILY Tysinger, Camelia Eng, PA-C Taking Active Self  furosemide (LASIX) 40 MG tablet [id number] Yes Take 1.5 tablets (60 mg total) by mouth daily. Weight daily and take additional lasix 40 mg for weight gain of 2-3 pounds Shelly Coss, MD Taking Active   gabapentin (NEURONTIN) 100 MG capsule [id number] Yes Take 2 capsules (200 mg total) by mouth at bedtime. Lyndal Pulley, DO Taking Active Self  hydrocortisone valerate ointment (WESTCORT) 0.2 % [id number] Yes Apply 1 application topically 2 (two) times daily. Tysinger, Camelia Eng, PA-C Taking Active   spironolactone (ALDACTONE) 25 MG tablet [id number] Yes Take 1 tablet (25 mg total) by mouth daily. Deboraha Sprang, MD Taking Active   terbinafine (LAMISIL) 1 % cream [id number] Yes Apply 1 application topically 2 (two) times daily. [provider] Taking Active          Plan: Route note to Dr. [name] Dr.Tysinger reached back out to me and said they were sending the glucometer in for the patient. Follow up with patient in 2 weeks.  Elayne Guerin, PharmD, [place] Clinical Pharmacist [phone]

## 2018-03-28 NOTE — Telephone Encounter (Signed)
This is the list I typed for her at her recent visit.  The only thing to add is Colchicine prn for gout flare and Spironolactone fluid pill was added back last week by cardiology.  On her summary I typed for her last visit, I also included the long list of items discontinued.    Keep in mind, in Epic when an After Visit summary is printed, it sometimes has med list from when triage was done without reflected med changes.   Nevertheless, the following is correct.   Also, [place], per last week's phone message, please call out glucometer, testing  Supplies, lancets, strips #100 on strips and lancets, #1 on glucometer.   #11 refills on strips and lancets.  The following is your new updated list of medications:  Allopurinol 300mg  daily Carvedilol/Coreg 25 mg twice daily Pradaxa/dabigatran 150 mg twice daily Entresto/sacubitril/valsartan 24/26 mg twice daily  Furosemide/Lasix 40 mg, 1.5 tablets daily.  Potassium/K-Dur 20 M EQ daily  Gabapentin 100 mg, 2 capsules at bedtime daily Pennsaid topical cream Tylenol 500mg  up to twice daily over the counter for pain as needed Cinnamon 500mg  daily over the counter Xanax as needed

## 2018-03-30 ENCOUNTER — Other Ambulatory Visit: Payer: Self-pay | Admitting: *Deleted

## 2018-03-30 ENCOUNTER — Telehealth: Payer: Self-pay | Admitting: Medical

## 2018-03-30 ENCOUNTER — Telehealth: Payer: Self-pay | Admitting: Pharmacist

## 2018-03-30 NOTE — Telephone Encounter (Signed)
[facility] stated they hasving been following up with pt on diabetes, heart failure. She is not monitoring her diabetes. Her weight is as follow:  03/28/18-264 03/29/18-266 03/30/18-269 Pt did not take her fluid pill yesterday. Took it today (this afternoon) pt is scheduled to get a heart monitor. Pt is not checking her glucose at home. Has not gotten glucose meter from the pharmacy but she is aware it is there. Pt has not symptoms with her weight increase.

## 2018-03-30 NOTE — Patient Outreach (Signed)
[facility]) Care Management  03/30/2018  Stephanie [name] [birth year] [id number]    Telephone Assessment  RN spoke with pt today due to a recent EMMI red related to weight gained with readings of 266 lbs-6/11 and 264 lbs -6/10 with no other symptoms reported. States no swelling to her LE however underwent a recent ultrasound was clear with other intervention. Pt states she did not take her "fluid pill yesterday or this morning but will take it now". RN confirmed pt has no swelling to her LE at this time. RN strongly encouraged pt to take all her recommended medications as directed to avoid acute issues from occurring. Pt with a clear understanding and again took her fluid medication while on the phone with this RN. RN strongly encouraged pt to weight in the AM upon arising after she empty's her bladder for the most adequeate weight.  Pt has confirmed that [place], [place] has decided discontinued pt's Metformin. RN continue to encourage pt to monitor her BS if possible due to her history. Pt indicated the glucometer has been odered to her local pharmacy and she will pick the meter up for her diabetic monitoring.   RN contacted Tysinger's office and spoke with his assistance concerning the above information concerning the last 3 days weights and the missed medications related to her fuild pill. Also informed office pt has not picked up her glucometer yet for BS monitoring but encouraged to obtain and start this process. Also informed Tysinger's office that pt has indicated Dr. Cleda Mccreedy (CAD) has discussed placing pt on a heart monitor.  Office aware RN will follow up next week to inquire again on pt's BS and glucometer. No orders or interventions from the provider's office at this time.  [name], RN Care Management Coordinator [place] Office [phone]

## 2018-03-30 NOTE — Patient Outreach (Signed)
Patient triggered Red on Emmi Heart Failure Dashboard, notification sent to:  [name], RN & Denyse Amass [facility]

## 2018-03-30 NOTE — Patient Outreach (Signed)
[facility]) Care Management  03/30/2018  Stephanie [name] 15-Jul-1951 [id number]   Patient was called to follow up on Emmi-Red. [place] Nurse, [name] spoke with patient earlier and triaged the patient with her increased weight due to not taking her fluid pill.  [facility] was called to check on the patient's blood glucometer.  The pharmacist reported a [employer] meter was filled with all the necessary supplies and the total cost is $3.61.  The patient has not picked up her meter yet.  Plan: Follow up with patient at originally schedule call back in 2 weeks.  Elayne Guerin, PharmD, [place] Clinical Pharmacist [phone]

## 2018-03-31 ENCOUNTER — Ambulatory Visit: Payer: Self-pay | Admitting: *Deleted

## 2018-04-04 ENCOUNTER — Other Ambulatory Visit: Payer: Self-pay | Admitting: *Deleted

## 2018-04-04 ENCOUNTER — Encounter: Payer: Self-pay | Admitting: *Deleted

## 2018-04-04 NOTE — Patient Outreach (Signed)
[facility]) Care Management  04/04/2018  Naryiah Schley [birth year] [id number]  EMMI RED  RN attempted outreach call for EMMI RED today and left a HIPAA message. Pt returned the call and RN inquired on the EMMI red. Pt states her "cheek hit the phone" and answered the question incorrectly. Pt states she is doing "fine" with no depression or hopeless feelings experienced. Pt states she is weight everyday and she put in the incorrect weight today. Its was 264 lbs with no signs or symptoms related to HF. RN continue to stress the plan of care related and verified pt continues to document all her weights and related issues in her calender. Pt very grateful for the calendar and carries the calendar to her provider's office to show her readings. No other issues or barriers presented at this time. Will scheduled the next home visit and continue to stress adherence with monitoring her HF.   [name], RN Care Management Coordinator [place] Office [phone]

## 2018-04-04 NOTE — Patient Outreach (Signed)
Patient triggered Red on Emmi Heart Failure Dashboard, notification sent to:  [name], RN & Denyse Amass, [facility]

## 2018-04-04 NOTE — Telephone Encounter (Signed)
This encounter was created in error - please disregard.

## 2018-04-04 NOTE — Telephone Encounter (Signed)
Info given to pt and she was advised if she had any questions to give [place] a call back. Kh

## 2018-04-04 NOTE — Telephone Encounter (Signed)
Please call patient as a follow up from her recent visit with me and home health.  Please make sure she got her glucometer from pharmacy and is checking morning sugars Also make sure she is checking her daily weights.  If 3 or more pounds gained in a day or a few days, then needs to call us so we can modify her fluid pills.   Per her recent heart doctor visit, Spironolactone fluid pill was added back.  See if she has any questions about her medications and after visit summary from our recent visit?

## 2018-04-05 DIAGNOSIS — I4891 Unspecified atrial fibrillation: Secondary | ICD-10-CM | POA: Diagnosis not present

## 2018-04-05 DIAGNOSIS — E039 Hypothyroidism, unspecified: Secondary | ICD-10-CM | POA: Diagnosis not present

## 2018-04-06 ENCOUNTER — Other Ambulatory Visit: Payer: Self-pay | Admitting: Pharmacist

## 2018-04-06 ENCOUNTER — Other Ambulatory Visit: Payer: Medicare Other | Admitting: *Deleted

## 2018-04-06 ENCOUNTER — Ambulatory Visit (INDEPENDENT_AMBULATORY_CARE_PROVIDER_SITE_OTHER): Payer: Medicare Other

## 2018-04-06 ENCOUNTER — Other Ambulatory Visit: Payer: Self-pay | Admitting: Internal Medicine

## 2018-04-06 DIAGNOSIS — I5022 Chronic systolic (congestive) heart failure: Secondary | ICD-10-CM

## 2018-04-06 DIAGNOSIS — I481 Persistent atrial fibrillation: Secondary | ICD-10-CM

## 2018-04-06 DIAGNOSIS — I428 Other cardiomyopathies: Secondary | ICD-10-CM

## 2018-04-06 DIAGNOSIS — I420 Dilated cardiomyopathy: Secondary | ICD-10-CM

## 2018-04-06 DIAGNOSIS — I4819 Other persistent atrial fibrillation: Secondary | ICD-10-CM

## 2018-04-06 LAB — BASIC METABOLIC PANEL
BUN/Creatinine Ratio: 18 (ref 12–28)
BUN: 25 mg/dL (ref 8–27)
CALCIUM: 9.6 mg/dL (ref 8.7–10.3)
CO2: 25 mmol/L (ref 20–29)
CREATININE: 1.37 mg/dL — AB (ref 0.57–1.00)
Chloride: 98 mmol/L (ref 96–106)
GFR calc Af Amer: 47 mL/min/{1.73_m2} — ABNORMAL LOW (ref >59)
GFR, EST NON AFRICAN AMERICAN: 41 mL/min/{1.73_m2} — AB (ref >59)
GLUCOSE: 185 mg/dL — AB (ref 65–99)
Potassium: 4.2 mmol/L (ref 3.5–5.2)
Sodium: 139 mmol/L (ref 134–144)

## 2018-04-06 NOTE — Patient Outreach (Signed)
[place] [facility]) Care Management  04/06/2018  Stephanie [name] Mar 05, 1951 [id number]   Incoming call from patient from my call to her last Thursday. HIPAA identifiers were obtained. Patient confirmed she received her diabetes testing supplies that were requested from her PCP.  She said she was at a doctor's appointment at the time of our call and said she would like for me to call her back.  Plan: Call patient back at our regular scheduled visit next Monday.  Elayne Guerin, PharmD, [place] Clinical Pharmacist [phone]

## 2018-04-07 ENCOUNTER — Encounter: Payer: Self-pay | Admitting: Medical

## 2018-04-08 ENCOUNTER — Telehealth: Payer: Self-pay

## 2018-04-08 DIAGNOSIS — I5043 Acute on chronic combined systolic (congestive) and diastolic (congestive) heart failure: Secondary | ICD-10-CM

## 2018-04-08 NOTE — Telephone Encounter (Signed)
Notes recorded by Dollene Primrose, RN on 04/08/2018 at 10:47 AM EDT Pt states she has been taken Lasix 40mg  qAM and then occasionally 40mg  prnPM. She takes her spironolactone 25mg  qAM. She states she is down about 5lbs and is feeling better. She understands the need to monitor her kidney fx and will be in the office 7/8 for repeat BMP. Pt had no additional questions and agrees with plan. ------  Notes recorded by Dollene Primrose, RN on 04/08/2018 at 10:20 AM EDT lvm for return call. ------  Notes recorded by Deboraha Sprang, MD on 04/07/2018 at 3:22 PM EDT Please Inform Patient that labs show a mild worsening, but only mild, of her kidney function How is her swelling  And lets recheck her renal function in 2 weeks to make sure it is not worsening  Thanks  ------  Notes recorded by Emily Filbert, RN on 04/06/2018 at 4:13 PM EDT Preliminary results reviewed. Forwarded to MD desktop for review and signature.

## 2018-04-11 ENCOUNTER — Other Ambulatory Visit: Payer: Self-pay | Admitting: Pharmacist

## 2018-04-11 ENCOUNTER — Other Ambulatory Visit: Payer: Medicare Other

## 2018-04-11 ENCOUNTER — Other Ambulatory Visit: Payer: Self-pay | Admitting: *Deleted

## 2018-04-11 ENCOUNTER — Ambulatory Visit: Payer: Self-pay | Admitting: Pharmacist

## 2018-04-11 DIAGNOSIS — Z1231 Encounter for screening mammogram for malignant neoplasm of breast: Secondary | ICD-10-CM | POA: Diagnosis not present

## 2018-04-11 LAB — HM MAMMOGRAPHY

## 2018-04-11 NOTE — Patient Outreach (Signed)
[place] [facility]) Care Management  04/11/2018  Stephanie [name] 1951/07/30 [id number]   Patient was called to follow up.  Unfortunately, she did not answer the phone.  HIPAA compliant message was left on the patient's voicemail.  Plan: With upcoming vacation coverage and plans, patient will be called back in 10 -14 days.   Elayne Guerin, PharmD, [place] Clinical Pharmacist [phone]

## 2018-04-11 NOTE — Patient Outreach (Signed)
[place] [facility]) Care Management  04/11/2018  Stephanie [name] 06/21/1951 [id number]    EMMI RED 6/21  Pt reports recent office visit to her provider Dr. Cleda Mccreedy on Friday returning the heart monitor and complete the labwork.  Pt states she was called with the readings and will need to return to repeat lab work in 2 weeks.  Pt states her provider's office is aware of her bilateral swelling to her LE and she has started on her Aldactone along with her Lasix. Pt verified she is not in distress with a weight this AM at 265 lbs. Pt denies any symptoms of HF and will continue to monitor for symptoms. Reiterated on the HF zones and inquired on pt's diabetic readings on her new glucometer. Pt again has not obtained a BS but indicated she would start the monitoring. RN will follow up accordingly next month's home visit if not sooner with EMMI monitoring. RN will continue to educate pt on her HF however will continue to stress the importance of BS monitoring due to her history and the referral initial related to diabetic teachings and educations. Plan of care discussed and interventions adjusted according to pt's progress. Will continue to address and inquire on pt's BS along with needed information related to her HF for pt's increased knowledge base and independent monitoring. Will follow up with a home visit in July.   [name], RN Care Management Coordinator [place] Office [phone]

## 2018-04-12 ENCOUNTER — Other Ambulatory Visit: Payer: Self-pay | Admitting: Medical

## 2018-04-12 NOTE — Telephone Encounter (Signed)
Is this ok to refill?  

## 2018-04-13 ENCOUNTER — Telehealth: Payer: Self-pay | Admitting: Medical

## 2018-04-13 NOTE — Telephone Encounter (Signed)
I am happy to report that her mammogram was normal, no worrisome findings.

## 2018-04-13 NOTE — Telephone Encounter (Signed)
Patient notified of mammogram results.

## 2018-04-26 ENCOUNTER — Other Ambulatory Visit: Payer: Medicare Other

## 2018-04-27 ENCOUNTER — Other Ambulatory Visit: Payer: Self-pay | Admitting: Pharmacist

## 2018-04-27 ENCOUNTER — Ambulatory Visit: Payer: Self-pay | Admitting: Pharmacist

## 2018-04-27 NOTE — Patient Outreach (Signed)
[facility]) Care Management  04/27/2018  Stephanie [name] 1951-05-20 [id number]   Patient was called for follow up. Unfortunately, she did not answer her phone. HIPAA compliant message was left on both the patient's cell and home numbers listed in her chart.  Plan: Send patient an unsuccessful contact letter. Today's call is unsuccessful call number 2.  Will call patient back in 5-7 business days.   Elayne Guerin, PharmD, [place] Clinical Pharmacist [phone]

## 2018-05-03 ENCOUNTER — Other Ambulatory Visit: Payer: Self-pay | Admitting: Pharmacist

## 2018-05-03 NOTE — Patient Outreach (Signed)
[place] [facility]) Care Management  05/03/2018  Stephanie [name] [birth year] [id number]   Patient was called to follow up. HIPAA identifiers were obtained. Patient confirmed she received her blood glucose testing supplies and that she is taking her medications as prescribed without issue.  She said she was just getting back from [place] and was very tired trying to get adjusted.  Plan: Close patient's pharmacy case.  Alert [facility] Community Nurse of Case Closure.

## 2018-05-05 ENCOUNTER — Other Ambulatory Visit: Payer: Self-pay | Admitting: *Deleted

## 2018-05-05 NOTE — Patient Outreach (Signed)
[place] [facility]) Care Management  05/05/2018  Kira Hartl [birth year] [id number]  Request to rescheduled today's HV   Outreached to pt today as requested concerning the scheduled HV for today. Pt requested to reschedule indicating she was very tired not sleeping to well at night and staying up late. Pt requested to scheduled today's HV for 7/23 @2 :00 PM. Will accommodate pt's request and continue ot encouraged again adherence with managing her diabetes. Pt indicated she would prefer to discuss this topic on the scheduled home visit next week. Will update plan of care at that time.   [name], RN Care Management Coordinator [place] Office [phone]

## 2018-05-10 ENCOUNTER — Other Ambulatory Visit: Payer: Self-pay | Admitting: *Deleted

## 2018-05-10 NOTE — Patient Outreach (Signed)
[facility]) Care Management  05/10/2018  Levada Bowersox December 17, 1950 [id number]   RN received a call requesting to reschedule today's appointment to this Friday with an update concerning her progress related to monitoring her diabetes. RN offer further assistance (declined) indicates she will be better prepared for a home visit this Friday and apologized for the inconvenience. RN spoke briefly on the topics that will be discussed on the next home visit (Friday). Will update care plan and further discussed pt's issues at that time. Will follow up accordingly on Friday's home visit.  [name], RN Care Management Coordinator [place] Office [phone]

## 2018-05-13 ENCOUNTER — Other Ambulatory Visit: Payer: Self-pay | Admitting: *Deleted

## 2018-05-13 NOTE — Patient Outreach (Signed)
[place] [facility]) Care Management   05/13/2018  Keriann Rankin 1951/01/24 [id number]  Stephanie [name] is an 67 y.o. female  Subjective:  HF:  Pt receptive to education on HF indicating that she continue to weigh daily and states she will start documenting all her readings moving forward in the [place]. Pt denies any signs or symptoms at this time verifying she is in the GREEN zone after further education provided of the HF zones.  Reports her weight today 264 lbs, yesterday 265 lbs and last week 266 lbs with no issues other then her history of bilateral swelling to her LE.  EDEMA: Pt reports she has some swelling to her lower legs but this is her history. Pt does not elevate her legs throughout the day but will start.  Pt reports her provider has taken her off of all the diabetic medications and pt has not started any CBG checks indicating she no longer has diabetes but would like to focus on her HF for ongoing [facility] services at this time.   Objective:   Review of Systems  Constitutional: Negative.   HENT: Negative.   Eyes: Negative.   Respiratory: Negative.   Cardiovascular: Positive for leg swelling.       1+ edema to LE  Gastrointestinal: Negative.   Genitourinary: Negative.   Musculoskeletal: Negative.   Skin: Negative.   Neurological: Negative.   Endo/Heme/Allergies: Negative.   Psychiatric/Behavioral: Negative.     Physical Exam  Constitutional: She is oriented to person, place, and time. She appears well-developed and well-nourished.  HENT:  Right Ear: External ear normal.  Left Ear: External ear normal.  Eyes: EOM are normal.  Neck: Normal range of motion. Neck supple.  Cardiovascular: Normal heart sounds.  Respiratory: Effort normal and breath sounds normal.  GI: Soft. Bowel sounds are normal.  Musculoskeletal: She exhibits edema.  1+ edema bilateral LE  Neurological: She is alert and oriented to person, place, and time.  Skin: Skin is warm and dry.   Psychiatric: She has a normal mood and affect. Her behavior is normal. Judgment and thought content normal.    Encounter Medications:   Outpatient Encounter Medications as of 05/13/2018  Medication Sig  . allopurinol (ZYLOPRIM) 300 MG tablet TAKE 300 MG BY MOUTH DAILY AS NEEDED FOR GOUT  . ALPRAZolam (XANAX) 1 MG tablet Take 1 tablet (1 mg total) by mouth at bedtime as needed for anxiety.  . carvedilol (COREG) 25 MG tablet TAKE 1 TABLET(25 MG) BY MOUTH TWICE DAILY  . CINNAMON PO Take 2 capsules by mouth daily.  . colchicine 0.6 MG tablet TAKE 1 TABLET BY MOUTH EVERY DAY AS NEEDED(GOUT)  . dabigatran (PRADAXA) 150 MG CAPS capsule Take 1 capsule (150 mg total) by mouth 2 (two) times daily.  . Diclofenac Sodium (PENNSAID) 2 % SOLN Use on knees topically as needed for pain  . ENTRESTO 24-26 MG TAKE 1 TABLET BY MOUTH TWICE DAILY  . furosemide (LASIX) 40 MG tablet Take 1.5 tablets (60 mg total) by mouth daily. Weight daily and take additional lasix 40 mg for weight gain of 2-3 pounds  . gabapentin (NEURONTIN) 100 MG capsule Take 2 capsules (200 mg total) by mouth at bedtime. (Patient not taking: Reported on 04/11/2018)  . glucose blood test strip Test twice a day. Pt uses contour next ez meter  . hydrocortisone valerate ointment (WESTCORT) 0.2 % Apply 1 application topically 2 (two) times daily.  Marland Kitchen MICROLET LANCETS MISC Test twice a day. Pt uses  contour next ez meter  . spironolactone (ALDACTONE) 25 MG tablet Take 1 tablet (25 mg total) by mouth daily.  Marland Kitchen terbinafine (LAMISIL) 1 % cream Apply 1 application topically 2 (two) times daily.   No facility-administered encounter medications on file as of 05/13/2018.     Functional Status:   In your present state of health, do you have any difficulty performing the following activities: 03/16/2018 03/02/2018  Hearing? N N  Vision? N N  Difficulty concentrating or making decisions? N N  Walking or climbing stairs? Y Y  Dressing or bathing? N N  Doing  errands, shopping? N N  Preparing Food and eating ? N -  Using the Toilet? N -  In the past six months, have you accidently leaked urine? N -  Do you have problems with loss of bowel control? N -  Managing your Medications? Y -  Managing your Finances? N -  Housekeeping or managing your Housekeeping? N -  Some recent data might be hidden    Fall/Depression Screening:    Fall Risk  03/16/2018 03/30/2017 09/23/2015  Falls in the past year? No Yes No  Number falls in past yr: - 1 -  Injury with Fall? - No -  Follow up - Education provided -   [facility] 2/9 Scores 03/16/2018 03/30/2017 09/23/2015  PHQ - 2 Score 0 0 0  BP 138/68 (BP Location: Right Arm, Patient Position: Sitting, Cuff Size: Large)   Resp 20   Wt 264 lb (119.7 kg)   BMI 46.77 kg/m    [facility] CM Care Plan Problem One     Most Recent Value  Care Plan Problem One  Diabetes management and knowledge deficit  Role Documenting the Problem One  Care Management Coordinator  Care Plan for Problem One  Active  THN Long Term Goal   Pt will have an increase knowledge base of diabetes management within 90 days  THN Long Term Goal Start Date  03/16/18 Aletha Halim extend this goal 2 weeks to allow adherence & f/u July]  THN Long Term Goal Met Date  05/13/18  THN CM Short Term Goal #1   Pt will check her BS daily over the next 30 days.  THN CM Short Term Goal #1 Start Date  03/16/18 [Will strongly encouraged & extend for adherence til July]  Interventions for Short Term Goal #1  provider  has informed pt not to monitor her diabetes at this time, pt states.    [facility] CM Care Plan Problem Two     Most Recent Value  Care Plan Problem Two  Lack of knowledge related HF  Role Documenting the Problem Two  Care Management Coordinator  Care Plan for Problem Two  Active  THN CM Short Term Goal #1   Pt will have an increase knowledge base and complete her daily weight within the next 30 days.   THN CM Short Term Goal #1 Start Date  03/16/18  Interventions for  Short Term Goal #2   Will extend to allow adherence with ongoing management of care related to pt's HF. Pt will continue daily weights and document all readings in [facility] calendar as demonstrated today on a home visit.   THN CM Short Term Goal #2   Pt will elevate her LE to reduce ongogin swelling over the next 30 days  (Pended)   THN CM Short Term Goal #2 Start Date  05/13/18  (Pended)   Interventions for Short Term Goal #2  Will stress the  improtance of elevating her LE to reduce swelling to lower the risk of acute symptoms related to HF. Will discuss the HF zones and inform pt to contact her provider with 3 lbs in 1 day or 5 lbs within 1 week in the YELLOW on the HF zone.   (Pended)      Assessment:   Case management related to HF education and monitoring Edema related to LE   Plan:  Will review all printed EMMI related to HF as followed (HF: Keeping Track of your weight and HF: Adult). Will review the plan of care and stress the importance of fluid retention and daily weights. Will education on HF zones and verify pt remains in the GREEN zone with no acute issues on today's assessment. Will verify weights over the last week and noted above and stress the importance of how fluid retention affects her HF exacerbation with acute symptoms. Will discuss her recent heart monitoring pending appointment with Dr. [name] on 8/19 next as pt has presented with the possibility of needing a pacemaker in the future. Will encouraged pt to have this conversation with her cardiologist next month and inquired further on her results.  Will strongly encouraged pt to elevate he LE to reduce ongoing swelling. Educated pt on the risk involved if she does not monitor and manage her ongoing swelling due to the fluid retention with her HF.Will stress the importance of contacting her provider with 3 lbs gained within one day or 5 lbs gained within one week which are symptoms that would fall in the YELLOW zone (pt with full  understanding) and aware of what to do if acute symptoms should occur.  Will discuss and update the plan of care with goals and adjusted interventions to accommodate ongiong adherence from this pt. Will follow up on the next home visit appointment in August.   [name], RN Care Management Coordinator [place] Office [phone]

## 2018-05-14 NOTE — Progress Notes (Signed)
[name] [address] [place] [place], [place] [address] Phone: [phone] Subjective:     CC: Bilateral knee pain  LPF:[id number]  Stephanie [name] is a 67 y.o. female coming in with complaint of bilateral knee pain. Was in a vehicle for 11 hours. Her pain increased after that. She also experienced increase in swelling. Has been having She is having pain in the right foot since last night. Has taken gout medication this morning.  Patient states still has swelling in the knees.  Patient states that sometimes there is no pain that stops her from activities.      Past Medical History:  Diagnosis Date  . Anemia   . Ankle fracture, right    x 2  . Antineoplastic and immunosuppressive drugs causing adverse effect in therapeutic use   . Asthma   . Chronic venous hypertension without complications   . Colon polyps 2009  . Coronary artery disease    no CAD by cath 11.2010  . Depressive disorder, not elsewhere classified   . Diabetes mellitus type 2 in obese (HCC)    hgb AIC 6.1 09/02/2009  . Diastolic CHF, chronic ([place])   . Diverticulosis of colon 2009   colonoscopy  . Gout   . Hepatitis, unspecified    hx of  . Hx of hysterectomy 2002  . Hyperlipidemia   . Hypertension   . Hypothyroidism   . Irritable bowel syndrome   . Lung nodule    32mm RLL nodule on CT Chest  07/2009, resolved by 2011 CT  . Noncompliance   . Obesity   . Persistent atrial fibrillation ([place])    s/p DCCV 07/2009; Pradaxa Rx  . PONV (postoperative nausea and vomiting)   . Sarcoidosis    dx by eye exam  . Sleep disturbance 06/2013   sleep study, mild abnormality, no criteria for CPAP   Past Surgical History:  Procedure Laterality Date  . ABDOMINAL HYSTERECTOMY    . CARDIOVERSION     x 2  . CARDIOVERSION N/A 02/27/2013   Procedure: CARDIOVERSION;  Surgeon: Lelon Perla, MD;  Location: [facility] ENDOSCOPY;  Service: Cardiovascular;  Laterality: N/A;  . CARDIOVERSION N/A 03/24/2017   Procedure: CARDIOVERSION;  Surgeon: Josue Hector, MD;  Location: [facility] ENDOSCOPY;  Service: Cardiovascular;  Laterality: N/A;  . CHOLECYSTECTOMY    . COLONOSCOPY     Diverticulosis, colon polyps, repeat 2014, Dr. Deatra Ina  . ESOPHAGOGASTRODUODENOSCOPY N/A 05/16/2014   Procedure: ESOPHAGOGASTRODUODENOSCOPY (EGD);  Surgeon: Wonda Horner, MD;  Location: [facility] ORS;  Service: Gastroenterology;  Laterality: N/A;  . HERNIA REPAIR  2009   Social History   Socioeconomic History  . Marital status: Single    Spouse name: Not on file  . Number of children: 5  . Years of education: college  . Highest education level: Not on file  Occupational History  . Occupation: Disabled.  Social Needs  . Financial resource strain: Not on file  . Food insecurity:    Worry: Not on file    Inability: Not on file  . Transportation needs:    Medical: Not on file    Non-medical: Not on file  Tobacco Use  . Smoking status: Never Smoker  . Smokeless tobacco: Never Used  Substance and Sexual Activity  . Alcohol use: No    Alcohol/week: 0.0 oz  . Drug use: No  . Sexual activity: Not Currently    Birth control/protection: Surgical  Lifestyle  . Physical activity:  Days per week: Not on file    Minutes per session: Not on file  . Stress: Not on file  Relationships  . Social connections:    Talks on phone: Not on file    Gets together: Not on file    Attends religious service: Not on file    Active member of club or organization: Not on file    Attends meetings of clubs or organizations: Not on file    Relationship status: Not on file  Other Topics Concern  . Not on file  Social History Narrative   Pt lives in [place] with her son.  Unemployed.  Right handed.     Education college   Allergies  Allergen Reactions  . Amiodarone Other (See Comments)    adverse reaction: Tremor/gait disurbance  . Atorvastatin Other (See Comments)    Body aches and pains--stiffness.   [name] [Pitavastatin]      Body aches  . Propoxyphene N-Acetaminophen Nausea And Vomiting  . Albuterol Palpitations    Palpitations, intolerance   Family History  Problem Relation Age of Onset  . Pneumonia Father        deceased  . Hypertension Father   . Cancer Father   . Multiple sclerosis Mother        hx  . Emphysema Other        runs in the family     Past medical history, social, surgical and family history all reviewed in electronic medical record.  No pertanent information unless stated regarding to the chief complaint.   Review of Systems:Review of systems updated and as accurate as of 05/16/18  No headache, visual changes, nausea, vomiting, diarrhea, constipation, dizziness, abdominal pain, skin rash, fevers, chills, night sweats, weight loss, swollen lymph nodes, body aches, chest pain, shortness of breath, mood changes.  Positive muscle aches and joint swelling  Objective  Blood pressure 128/90, pulse 97, height 5\' 3"  (1.6 m), weight 271 lb (122.9 kg), SpO2 97 %. Systems examined below as of 05/16/18   General: No apparent distress alert and oriented x3 mood and affect normal, dressed appropriately.  HEENT: Pupils equal, extraocular movements intact  Respiratory: Patient's speak in full sentences and does not appear short of breath  Cardiovascular: No lower extremity edema, non tender, no erythema  Skin: Warm dry intact with no signs of infection or rash on extremities or on axial skeleton.  Abdomen: Soft nontender  Neuro: Cranial nerves II through XII are intact, neurovascularly intact in all extremities with 2+ DTRs and 2+ pulses.  Lymph: No lymphadenopathy of posterior or anterior cervical chain or axillae bilaterally.  Gait antalgic MSK:  Non tender with full range of motion and good stability and symmetric strength and tone of shoulders, elbows, wrist, hip, and ankles bilaterally.   Knee: Bilateral valgus deformity noted. Large thigh to calf ratio.  Tender to palpation over medial and  PF joint line.  ROM full in flexion and extension and lower leg rotation. instability with valgus force.  painful patellar compression. Patellar glide with moderate crepitus. Patellar and quadriceps tendons unremarkable. Hamstring and quadriceps strength is normal.  After informed written and verbal consent, patient was seated on exam table. Right knee was prepped with alcohol swab and utilizing anterolateral approach, patient's right knee space was injected with 4:1  marcaine 0.5%: Kenalog 40mg /dL. Patient tolerated the procedure well without immediate complications.  After informed written and verbal consent, patient was seated on exam table. Left knee was prepped with alcohol swab  and utilizing anterolateral approach, patient's left knee space was injected with 4:1  marcaine 0.5%: Kenalog 40mg /dL. Patient tolerated the procedure well without immediate complications.   Impression and Recommendations:     This case required medical decision making of moderate complexity.      Note: This dictation was prepared with Dragon dictation along with smaller phrase technology. Any transcriptional errors that result from this process are unintentional.

## 2018-05-16 ENCOUNTER — Encounter

## 2018-05-16 ENCOUNTER — Encounter: Payer: Self-pay | Admitting: Family Medicine

## 2018-05-16 ENCOUNTER — Ambulatory Visit (INDEPENDENT_AMBULATORY_CARE_PROVIDER_SITE_OTHER): Payer: Medicare Other | Admitting: Family Medicine

## 2018-05-16 DIAGNOSIS — M170 Bilateral primary osteoarthritis of knee: Secondary | ICD-10-CM | POA: Diagnosis not present

## 2018-05-16 NOTE — Assessment & Plan Note (Signed)
Worsening symptoms bilateral knee injections given.  Discussed with patient in great length.  Severe arthritis bilaterally.  Patient is still encouraged to continue with the topical anti-inflammatories, icing regimen, home exercises.  Patient follow-up again in 4 to 6 weeks.

## 2018-05-16 NOTE — Patient Instructions (Signed)
Good to see you  Alvera Singh is your friend. Ice 20 minutes 2 times daily. Usually after activity and before bed. Exercises 3 times a week.  Keep trying to be active See me again in 8 weeks

## 2018-06-06 ENCOUNTER — Encounter: Payer: Self-pay | Admitting: Internal Medicine

## 2018-06-06 ENCOUNTER — Ambulatory Visit (INDEPENDENT_AMBULATORY_CARE_PROVIDER_SITE_OTHER): Payer: Medicare Other | Admitting: Internal Medicine

## 2018-06-06 VITALS — BP 134/86 | HR 85 | Ht 63.0 in | Wt 265.8 lb

## 2018-06-06 DIAGNOSIS — I428 Other cardiomyopathies: Secondary | ICD-10-CM | POA: Diagnosis not present

## 2018-06-06 DIAGNOSIS — I481 Persistent atrial fibrillation: Secondary | ICD-10-CM | POA: Diagnosis not present

## 2018-06-06 DIAGNOSIS — I10 Essential (primary) hypertension: Secondary | ICD-10-CM | POA: Diagnosis not present

## 2018-06-06 DIAGNOSIS — I5043 Acute on chronic combined systolic (congestive) and diastolic (congestive) heart failure: Secondary | ICD-10-CM | POA: Diagnosis not present

## 2018-06-06 DIAGNOSIS — I4819 Other persistent atrial fibrillation: Secondary | ICD-10-CM

## 2018-06-06 NOTE — Patient Instructions (Addendum)
Medication Instructions:  Your physician recommends that you continue on your current medications as directed. Please refer to the Current Medication list given to you today.  Labwork: You will have labs drawn today: CBC and BMP  Testing/Procedures: Your physician has requested that you have an echocardiogram. Echocardiography is a painless test that uses sound waves to create images of your heart. It provides your doctor with information about the size and shape of your heart and how well your heart's chambers and valves are working. This procedure takes approximately one hour. There are no restrictions for this procedure.   Follow-Up: Your physician recommends that you schedule a follow-up appointment in: 4 months with Tommye Standard, PA  Any Other Special Instructions Will Be Listed Below (If Applicable).     If you need a refill on your cardiac medications before your next appointment, please call your pharmacy.

## 2018-06-06 NOTE — Progress Notes (Signed)
Patient Care Team: Tysinger, Camelia Eng, PA-C as PCP - General (Family Medicine) Tobi Bastos, RN as [place] Management   HPI  Stephanie [name] is a 67 y.o. female Seen in followup for atrial fibrillation in the context of morbid obesity, sarcoid and mild left ventricular hypertrophy. Catheterization 2012 demonstrated nonobstructive coronary disease and normal left ventricular function and elevated filling pressures By echocardiogram, recurrent cardiomyopathy was demonstrated with EF 25-30% April 2014   She underwent cardioversion may/14 with the hopes of restoring sinus rhythm and seeing recovery of LV systolic function. She had rapidly reverted to afib and then spontaneously reverted to sinus rhythm. There has been  interval near normalization of LV systolic function confirmed by echo August 2014   She saw Dr. Greggory Brandy 3/15 to consider catheter ablation.  She was not interested. He reduced her amiodarone to 200 mg a day. Encouraged her to refrain from alcohol and to work on blood pressure and weight reduction.  She had a sleep study 2014 with an AHI of 7.5    She developed hyperthyroidism on amiodarone prompting its discontinuation. She has not been able to forge a working relationship with endo, bringing in internet reviews of Dr Darnell Level     DATE TEST EF   3/12    Echo     25 %   8/14 Echo  55-60 %   12/17 Echo  25-30% LAE (47/2.0/45)  5/19 Echo 15-20%    Holter 6/19  Mean HR 88 Range 40-182   She is doing much better over the last 2 months  she has been taking her medications regularly she and she has lost weight, less edema and less dyspnea  Date TSH Cr K Hgb  6/17 1.9     1/18 <<0.01     3/18 2.2     6/18 7.4 1.1  12.6  5/19  1.09 4.6 11.1            .   Past Medical History:  Diagnosis Date  . Anemia   . Ankle fracture, right    x 2  . Antineoplastic and immunosuppressive drugs causing adverse effect in therapeutic use   . Asthma   .  Chronic venous hypertension without complications   . Colon polyps 2009  . Coronary artery disease    no CAD by cath 11.2010  . Depressive disorder, not elsewhere classified   . Diabetes mellitus type 2 in obese (HCC)    hgb AIC 6.1 09/02/2009  . Diastolic CHF, chronic ([place])   . Diverticulosis of colon 2009   colonoscopy  . Gout   . Hepatitis, unspecified    hx of  . Hx of hysterectomy 2002  . Hyperlipidemia   . Hypertension   . Hypothyroidism   . Irritable bowel syndrome   . Lung nodule    74mm RLL nodule on CT Chest  07/2009, resolved by 2011 CT  . Noncompliance   . Obesity   . Persistent atrial fibrillation ([place])    s/p DCCV 07/2009; Pradaxa Rx  . PONV (postoperative nausea and vomiting)   . Sarcoidosis    dx by eye exam  . Sleep disturbance 06/2013   sleep study, mild abnormality, no criteria for CPAP    Past Surgical History:  Procedure Laterality Date  . ABDOMINAL HYSTERECTOMY    . CARDIOVERSION     x 2  . CARDIOVERSION N/A 02/27/2013   Procedure: CARDIOVERSION;  Surgeon: Lelon Perla, MD;  Location: [place];  Service: Cardiovascular;  Laterality: N/A;  . CARDIOVERSION N/A 03/24/2017   Procedure: CARDIOVERSION;  Surgeon: Josue Hector, MD;  [facility] END[facility]COPY;  Service: Cardiovascular;  Laterality: N/A;  . CHOLECYSTECTOMY    . COLONOSCOPY     Diverticulosis, colon polyps, repeat 2014, Dr. Deatra Ina  . ESOPHAGOGASTRODUODENOSCOPY N/A 05/16/2014   Procedure: ESOPHAGOGASTRODUODENOSCOPY (EGD);  Surgeon: Wonda Horner, MD;  Location: [facility] ORS;  Service: Gastroenterology;  Laterality: N/A;  . HERNIA REPAIR  2009    Current Outpatient Medications  Medication Sig Dispense Refill  . allopurinol (ZYLOPRIM) 300 MG tablet TAKE 300 MG BY MOUTH DAILY AS NEEDED FOR GOUT 90 tablet 2  . ALPRAZolam (XANAX) 1 MG tablet Take 1 tablet (1 mg total) by mouth at bedtime as needed for anxiety. 45 tablet 1  . carvedilol (COREG) 25 MG tablet TAKE 1 TABLET(25 MG) BY MOUTH TWICE  DAILY 180 tablet 1  . CINNAMON PO Take 2 capsules by mouth daily.    . colchicine 0.6 MG tablet TAKE 1 TABLET BY MOUTH EVERY DAY AS NEEDED(GOUT) 30 tablet 0  . dabigatran (PRADAXA) 150 MG CAPS capsule Take 1 capsule (150 mg total) by mouth 2 (two) times daily. 60 capsule 5  . Diclofenac Sodium (PENNSAID) 2 % SOLN Use on knees topically as needed for pain    . ENTRESTO 24-26 MG TAKE 1 TABLET BY MOUTH TWICE DAILY 180 tablet 0  . furosemide (LASIX) 40 MG tablet Take 1.5 tablets (60 mg total) by mouth daily. Weight daily and take additional lasix 40 mg for weight gain of 2-3 pounds 45 tablet 6  . spironolactone (ALDACTONE) 25 MG tablet Take 1 tablet (25 mg total) by mouth daily. 90 tablet 3  . terbinafine (LAMISIL) 1 % cream Apply 1 application topically 2 (two) times daily.     No current facility-administered medications for this visit.     Allergies  Allergen Reactions  . Amiodarone Other (See Comments)    adverse reaction: Tremor/gait disurbance  . Atorvastatin Other (See Comments)    Body aches and pains--stiffness.   [name] [Pitavastatin]     Body aches  . Propoxyphene N-Acetaminophen Nausea And Vomiting  . Albuterol Palpitations    Palpitations, intolerance    Review of Systems negative except from HPI and PMH  Physical Exam BP 134/86   Pulse 85   Ht 5\' 3"  (1.6 m)   Wt 265 lb 12.8 oz (120.6 kg)   SpO2 96%   BMI 47.08 kg/m  Well developed and nourished in no acute distress HENT normal Neck supple with JVP-flat Clear Regular rate and rhythm, no murmurs or gallops Abd-soft with active BS No Clubbing cyanosis tr edema Skin-warm and dry A & Oriented  Grossly normal sensory and motor function  ECG Afib 85 -/10/38  Assessment and  Plan  Hypertension     Atrial fibrillation-long standing persistent    Cardiomyopathy non ischemic   Congestive heart failure  Class 3  Sarcoid  Obesity    Tremor   Hyperthyroidism >> now hypothyroidism   Will repeat echo at  next visit  Will check renal function and potassium on current meds  Continue to adjust diuretics as she is doing, she appreciates the wisdom of diuretics  Her HR is not likely fast enough toexplain the cardiomyopathy, but will need to keep this in mind, as we dont have another better explanation for her LV function   We spent more than 50% of our >25 min visit  in face to face counseling regarding the above

## 2018-06-07 ENCOUNTER — Other Ambulatory Visit: Payer: Self-pay | Admitting: *Deleted

## 2018-06-07 LAB — BASIC METABOLIC PANEL
BUN/Creatinine Ratio: 20 (ref 12–28)
BUN: 28 mg/dL — AB (ref 8–27)
CHLORIDE: 97 mmol/L (ref 96–106)
CO2: 25 mmol/L (ref 20–29)
Calcium: 9.5 mg/dL (ref 8.7–10.3)
Creatinine, Ser: 1.43 mg/dL — ABNORMAL HIGH (ref 0.57–1.00)
GFR calc non Af Amer: 38 mL/min/{1.73_m2} — ABNORMAL LOW (ref >59)
GFR, EST AFRICAN AMERICAN: 44 mL/min/{1.73_m2} — AB (ref >59)
GLUCOSE: 425 mg/dL — AB (ref 65–99)
POTASSIUM: 4.4 mmol/L (ref 3.5–5.2)
Sodium: 136 mmol/L (ref 134–144)

## 2018-06-07 LAB — CBC
HEMATOCRIT: 38 % (ref 34.0–46.6)
Hemoglobin: 12.4 g/dL (ref 11.1–15.9)
MCH: 31.6 pg (ref 26.6–33.0)
MCHC: 32.6 g/dL (ref 31.5–35.7)
MCV: 97 fL (ref 79–97)
Platelets: 187 10*3/uL (ref 150–450)
RBC: 3.92 x10E6/uL (ref 3.77–5.28)
RDW: 14.8 % (ref 12.3–15.4)
WBC: 6.7 10*3/uL (ref 3.4–10.8)

## 2018-06-07 NOTE — Patient Outreach (Signed)
[facility]) Care Management   06/07/2018  Stephanie [name] [birth year] [id number]  Stephanie [name] is an 67 y.o. female  Subjective:  HF: Pt reports she had been weighing however not documenting all read outs in calender for her providers to view.  Pt reports a recent office visit with Dr. [name] who had a good report with plans for pt to undergo Echocardiogram on 8/26. Pt has indicated she will try to put all her readings in the [place]. Pt able to indicates she understanding the HF zones and verified she remains in the GREEN zone with no acute issues or needs today.   Objective:   Review of Systems  All other systems reviewed and are negative.   Physical Exam  Constitutional: She is oriented to person, place, and time. She appears well-developed and well-nourished.  HENT:  Right Ear: External ear normal.  Left Ear: External ear normal.  Eyes: EOM are normal.  Neck: Normal range of motion.  Cardiovascular: Normal heart sounds.  Respiratory: Effort normal and breath sounds normal.  GI: Soft. Bowel sounds are normal.  Musculoskeletal: Normal range of motion.  Neurological: She is alert and oriented to person, place, and time.  Skin: Skin is warm and dry.  Psychiatric: She has a normal mood and affect. Her behavior is normal. Judgment and thought content normal.    Encounter Medications:   Outpatient Encounter Medications as of 06/07/2018  Medication Sig  . allopurinol (ZYLOPRIM) 300 MG tablet TAKE 300 MG BY MOUTH DAILY AS NEEDED FOR GOUT  . ALPRAZolam (XANAX) 1 MG tablet Take 1 tablet (1 mg total) by mouth at bedtime as needed for anxiety.  . carvedilol (COREG) 25 MG tablet TAKE 1 TABLET(25 MG) BY MOUTH TWICE DAILY  . CINNAMON PO Take 2 capsules by mouth daily.  . colchicine 0.6 MG tablet TAKE 1 TABLET BY MOUTH EVERY DAY AS NEEDED(GOUT)  . dabigatran (PRADAXA) 150 MG CAPS capsule Take 1 capsule (150 mg total) by mouth 2 (two) times daily.  . Diclofenac  Sodium (PENNSAID) 2 % SOLN Use on knees topically as needed for pain  . ENTRESTO 24-26 MG TAKE 1 TABLET BY MOUTH TWICE DAILY  . furosemide (LASIX) 40 MG tablet Take 1.5 tablets (60 mg total) by mouth daily. Weight daily and take additional lasix 40 mg for weight gain of 2-3 pounds  . spironolactone (ALDACTONE) 25 MG tablet Take 1 tablet (25 mg total) by mouth daily.  Marland Kitchen terbinafine (LAMISIL) 1 % cream Apply 1 application topically 2 (two) times daily.   No facility-administered encounter medications on file as of 06/07/2018.     Functional Status:   In your present state of health, do you have any difficulty performing the following activities: 03/16/2018 03/02/2018  Hearing? N N  Vision? N N  Difficulty concentrating or making decisions? N N  Walking or climbing stairs? Y Y  Dressing or bathing? N N  Doing errands, shopping? N N  Preparing Food and eating ? N -  Using the Toilet? N -  In the past six months, have you accidently leaked urine? N -  Do you have problems with loss of bowel control? N -  Managing your Medications? Y -  Managing your Finances? N -  Housekeeping or managing your Housekeeping? N -  Some recent data might be hidden    Fall/Depression Screening:    Fall Risk  03/16/2018 03/30/2017 09/23/2015  Falls in the past year? No Yes No  Number falls in past  yr: - 1 -  Injury with Fall? - No -  Follow up - Education provided -   [facility] 2/9 Scores 03/16/2018 03/30/2017 09/23/2015  PHQ - 2 Score 0 0 0   THN CM Care Plan Problem One     Most Recent Value  THN CM Short Term Goal #1   Pt will check her BS daily over the next 30 days.  THN CM Short Term Goal #1 Start Date  03/16/18 [Will strongly encouraged & extend for adherence til July]    [facility] CM Care Plan Problem Two     Most Recent Value  Care Plan Problem Two  Lack of knowledge related HF  Role Documenting the Problem Two  Care Management Coordinator  Care Plan for Problem Two  Active  THN CM Short Term Goal #1   Pt  will have an increase knowledge base and complete her daily weight within the next 30 days.   THN CM Short Term Goal #1 Start Date  03/16/18 [Will extend to allow adherence over the next month (Sept).]  Interventions for Short Term Goal #2   Will strongly encouraged pt to document all weigts in one place on the provided [facility] calendar for her providers to view. Will extend to allow adherence with daily monitoring for her weights.  THN CM Short Term Goal #2   Pt will elevate her LE to reduce ongogin swelling over the next 30 days  THN CM Short Term Goal #2 Start Date  05/13/18  Interventions for Short Term Goal #2  Pt w/out swelling today however will extend to continue to allow adherence      BP 134/60 (BP Location: Right Arm, Patient Position: Sitting, Cuff Size: Normal)   Pulse 79   Resp 20   Ht 1.6 m (5\' 3" )   Wt 259 lb (117.5 kg)   SpO2 98%   BMI 45.88 kg/m   Assessment:   Ongoing case management related to HF  Plan:  Will reiterated on HF zones and verify pt remains in the GREEN zone. Will review the HF zones with when to call her provider with 3 lbs overnight or 5 lbs within one week with any precipitating symptoms. Care plan reviewed and interventions discussed and updated accordingly. Will encouraged ongoing adherence and review the HF zones and importance of daily monitoring.   [name], RN Care Management Coordinator [place] Office [phone]

## 2018-06-09 ENCOUNTER — Telehealth: Payer: Self-pay | Admitting: Medical

## 2018-06-09 NOTE — Telephone Encounter (Signed)
I just received an alert from [facility] about duplication of beta blockers that her pharmacy keeps filling BOTH Coreg/Carvedilol AND Metoprolol on 02/24/18.   Please call [facility] and once again make sure the discontinue Metoprolol.   This is not the first time we have called [facility] about this.   Please also make sure patient isn't taken both drugs.   I had printed her out a medication reconciliation last time she was here, so hopefully she isn't taking both.

## 2018-06-09 NOTE — Telephone Encounter (Signed)
Spoke with pharmacy and patient has not gotten metoprolol since May 2019.   She has no refills and the RX has been closed .

## 2018-06-13 ENCOUNTER — Other Ambulatory Visit (HOSPITAL_COMMUNITY): Payer: Medicare Other

## 2018-06-22 ENCOUNTER — Encounter: Payer: Self-pay | Admitting: Medical

## 2018-06-22 ENCOUNTER — Ambulatory Visit (INDEPENDENT_AMBULATORY_CARE_PROVIDER_SITE_OTHER): Payer: Medicare Other | Admitting: Medical

## 2018-06-22 VITALS — BP 140/86 | HR 58 | Temp 97.8°F | Ht 63.0 in | Wt 260.4 lb

## 2018-06-22 DIAGNOSIS — Z794 Long term (current) use of insulin: Secondary | ICD-10-CM | POA: Diagnosis not present

## 2018-06-22 DIAGNOSIS — R631 Polydipsia: Secondary | ICD-10-CM | POA: Diagnosis not present

## 2018-06-22 DIAGNOSIS — E118 Type 2 diabetes mellitus with unspecified complications: Secondary | ICD-10-CM | POA: Diagnosis not present

## 2018-06-22 DIAGNOSIS — H538 Other visual disturbances: Secondary | ICD-10-CM | POA: Insufficient documentation

## 2018-06-22 DIAGNOSIS — R358 Other polyuria: Secondary | ICD-10-CM | POA: Diagnosis not present

## 2018-06-22 DIAGNOSIS — E119 Type 2 diabetes mellitus without complications: Secondary | ICD-10-CM

## 2018-06-22 DIAGNOSIS — R739 Hyperglycemia, unspecified: Secondary | ICD-10-CM | POA: Insufficient documentation

## 2018-06-22 DIAGNOSIS — R3589 Other polyuria: Secondary | ICD-10-CM | POA: Insufficient documentation

## 2018-06-22 DIAGNOSIS — IMO0001 Reserved for inherently not codable concepts without codable children: Secondary | ICD-10-CM

## 2018-06-22 DIAGNOSIS — R634 Abnormal weight loss: Secondary | ICD-10-CM | POA: Insufficient documentation

## 2018-06-22 LAB — POCT URINALYSIS DIP (PROADVANTAGE DEVICE)
BILIRUBIN UA: NEGATIVE
Glucose, UA: 100 mg/dL — AB
Leukocytes, UA: NEGATIVE
Nitrite, UA: NEGATIVE
PH UA: 6 (ref 5.0–8.0)

## 2018-06-22 LAB — POCT CBG (FASTING - GLUCOSE)-MANUAL ENTRY: GLUCOSE FASTING, POC: 505 mg/dL — AB (ref 70–99)

## 2018-06-22 MED ORDER — INSULIN LISPRO 200 UNIT/ML ~~LOC~~ SOPN: 10.0000 [IU] | PEN_INJECTOR | Freq: Three times a day (TID) | SUBCUTANEOUS | 2 refills | Status: DC

## 2018-06-22 NOTE — Progress Notes (Signed)
Subjective Chief Complaint  Patient presents with  . Diabetes    glucose 505,left eye throbbing   Here at my request for elevated blood sugars.  She was seen recently by cardiology, glucose found to be over 400.  In the past 2 years her diabetes numbers have been relatively controlled with diet or with medication although she was not the most compliant with taking her medication  However recently her blood sugars on labs were found to be really high  Ran out of Metformin 2 weeks ago.   In the past has only been on Metformin.    Been feeling thirsty for the past week, urinating a lot, every hour.   Having glossy feeling in eyes .  Has been losing weight.    Checks blood sugars periodically, sometimes goes a week or more without checking.  Recently she was seeing some 200 readings.   She notes begin out of town a lot lately and had birthday recently.   Has been eating cake, more fast food.      Past Medical History:  Diagnosis Date  . Anemia   . Ankle fracture, right    x 2  . Antineoplastic and immunosuppressive drugs causing adverse effect in therapeutic use   . Asthma   . Chronic venous hypertension without complications   . Colon polyps 2009  . Coronary artery disease    no CAD by cath 11.2010  . Depressive disorder, not elsewhere classified   . Diabetes mellitus type 2 in obese (HCC)    hgb AIC 6.1 09/02/2009  . Diastolic CHF, chronic ([place])   . Diverticulosis of colon 2009   colonoscopy  . Gout   . Hepatitis, unspecified    hx of  . Hx of hysterectomy 2002  . Hyperlipidemia   . Hypertension   . Hypothyroidism   . Irritable bowel syndrome   . Lung nodule    9mm RLL nodule on CT Chest  07/2009, resolved by 2011 CT  . Noncompliance   . Obesity   . Persistent atrial fibrillation ([place])    s/p DCCV 07/2009; Pradaxa Rx  . PONV (postoperative nausea and vomiting)   . Sarcoidosis    dx by eye exam  . Sleep disturbance 06/2013   sleep study, mild abnormality, no  criteria for CPAP   Current Outpatient Medications on File Prior to Visit  Medication Sig Dispense Refill  . allopurinol (ZYLOPRIM) 300 MG tablet TAKE 300 MG BY MOUTH DAILY AS NEEDED FOR GOUT 90 tablet 2  . ALPRAZolam (XANAX) 1 MG tablet Take 1 tablet (1 mg total) by mouth at bedtime as needed for anxiety. 45 tablet 1  . carvedilol (COREG) 25 MG tablet TAKE 1 TABLET(25 MG) BY MOUTH TWICE DAILY 180 tablet 1  . CINNAMON PO Take 2 capsules by mouth daily.    . colchicine 0.6 MG tablet TAKE 1 TABLET BY MOUTH EVERY DAY AS NEEDED(GOUT) 30 tablet 0  . dabigatran (PRADAXA) 150 MG CAPS capsule Take 1 capsule (150 mg total) by mouth 2 (two) times daily. 60 capsule 5  . Diclofenac Sodium (PENNSAID) 2 % SOLN Use on knees topically as needed for pain    . ENTRESTO 24-26 MG TAKE 1 TABLET BY MOUTH TWICE DAILY 180 tablet 0  . furosemide (LASIX) 40 MG tablet Take 1.5 tablets (60 mg total) by mouth daily. Weight daily and take additional lasix 40 mg for weight gain of 2-3 pounds 45 tablet 6  . spironolactone (ALDACTONE) 25 MG tablet Take  1 tablet (25 mg total) by mouth daily. 90 tablet 3  . terbinafine (LAMISIL) 1 % cream Apply 1 application topically 2 (two) times daily.     No current facility-administered medications on file prior to visit.    ROS as in subjective    Objective: BP 140/86   Pulse (!) 58   Temp 97.8 F (36.6 C) (Oral)   Ht 5\' 3"  (1.6 m)   Wt 260 lb 6.4 oz (118.1 kg)   SpO2 98%   BMI 46.13 kg/m   Wt Readings from Last 3 Encounters:  06/22/18 260 lb 6.4 oz (118.1 kg)  06/07/18 259 lb (117.5 kg)  06/06/18 265 lb 12.8 oz (120.6 kg)   June 02/2018 weight was 274lb.  Gen: wd, wn, nad No clammy skin, normal turgor and texture Psych: pleasant, answers questions appropriately Neuro: A&Ox 3 Ext: 1+ nonpitting bilat LE edema Pulses : 2+    Assessment: Encounter Diagnoses  Name Primary?  . IDDM (insulin dependent diabetes mellitus) ([place]) Yes  . Type 2 diabetes mellitus with  complication, without long-term current use of insulin ([place])   . Hyperglycemia   . Polyuria   . Polydipsia   . Blurred vision   . Weight loss      Plan: She has been relatively controlled with her sugars over the past 2 years, not the best with compliance, but in general has taken metformin somewhat regularly.  She has never been on any other medicine for diabetes.  I tried to switch her to [place] back in May ,but she ultimately decided not to do this  However in the last week or 2 her blood sugars have spiraled out of control likely due to poor diet.  Between her birthday eating a bunch of sweets and being out of town eating fast food her sugars are out of control  We discussed the danger of high sugars, possible complications.  we discussed going to the emergency department versus outpatient treatment today.  She declines going to the hospital.  I advised she needs to check her blood sugars before every meal and fasting in the morning and write these down.  Advised she begin fast acting mealtime insulin Humalog.  We will start out 10 units before meals to keep it simple for her.  I wrote down some recommendations and guidance for her today  We discussed avoiding sweets or high sugar foods and trying to eat much healthier particularly in the short-term to get things under control  We will likely need to ramp up the units at mealtime over the next several days.  I asked her to call back in 2 days to give me an update.  We discussed hypoglycemia as well  She voices understanding and agreement with plan.  She also was advised that if any worsening blood sugars the next few days or if not seeing improvements or having worse symptoms to go to the emergency department  Juliette was seen today for diabetes.  Diagnoses and all orders for this visit:  IDDM (insulin dependent diabetes mellitus) ([place])  Type 2 diabetes mellitus with complication, without long-term current use of insulin (HCC) -      Glucose (CBG), Fasting -     POCT Urinalysis DIP (Proadvantage Device)  Hyperglycemia  Polyuria  Polydipsia  Blurred vision  Weight loss  Other orders -     Insulin Lispro (HUMALOG KWIKPEN) 200 UNIT/ML SOPN; Inject 10 Units into the skin 3 (three) times daily with meals.

## 2018-06-22 NOTE — Patient Instructions (Signed)
Your blood sugars are unfortunately in the danger zone, way too high!  I want you to begin mealtime fast acting insulin.  We gave you a sample of Humalog mealtime fast acting insulin today   Check your sugars before every meal and write these down in a logbook  If your blood sugars before meal or fasting in the morning are greater than 180, then use 10 units of Humalog insulin  If your blood sugar happens to be less than 180, or if you skip a meal only use 5 units of insulin at that meal  You should however be using this 3 times a day though with meals  If you happen to see a blood sugar reading under 70, drink some orange juice, wait 15 minutes and check your sugar again, and then call us or call the emergency department  In the next few days if your sugars are gradually climbing higher and higher such as 600 or 700 and not improving at all then go to the emergency department  I want to see you back Friday or Monday  If you cannot make it back til Monday, then call back by Friday morning with an update on sugar readings and how you are feeling

## 2018-06-25 ENCOUNTER — Telehealth: Payer: Self-pay | Admitting: Medical

## 2018-06-25 NOTE — Telephone Encounter (Signed)
P.A. HUMALOG

## 2018-06-27 ENCOUNTER — Ambulatory Visit: Payer: Medicare Other | Admitting: Medical

## 2018-06-29 ENCOUNTER — Ambulatory Visit: Payer: Medicare Other | Admitting: Medical

## 2018-07-05 NOTE — Telephone Encounter (Signed)
P.A. Approved til 10/18/18

## 2018-07-05 NOTE — Telephone Encounter (Signed)
P.A. Ruthell Rummage kwikpen Approved til 10/18/18

## 2018-07-06 NOTE — Telephone Encounter (Signed)
Left message for pt, faxed pharmacy  

## 2018-07-08 ENCOUNTER — Other Ambulatory Visit: Payer: Self-pay

## 2018-07-08 ENCOUNTER — Encounter (HOSPITAL_COMMUNITY): Payer: Self-pay | Admitting: Emergency Medicine

## 2018-07-08 ENCOUNTER — Emergency Department (HOSPITAL_COMMUNITY)
Admission: EM | Admit: 2018-07-08 | Discharge: 2018-07-08 | Disposition: A | Discharge status: Home / Self Care | Payer: Medicare Other | Attending: Emergency Medicine | Admitting: Emergency Medicine

## 2018-07-08 DIAGNOSIS — L0291 Cutaneous abscess, unspecified: Secondary | ICD-10-CM

## 2018-07-08 DIAGNOSIS — Z9114 Patient's other noncompliance with medication regimen: Secondary | ICD-10-CM | POA: Insufficient documentation

## 2018-07-08 DIAGNOSIS — I110 Hypertensive heart disease with heart failure: Secondary | ICD-10-CM | POA: Insufficient documentation

## 2018-07-08 DIAGNOSIS — Z79899 Other long term (current) drug therapy: Secondary | ICD-10-CM | POA: Diagnosis not present

## 2018-07-08 DIAGNOSIS — N739 Female pelvic inflammatory disease, unspecified: Secondary | ICD-10-CM | POA: Diagnosis not present

## 2018-07-08 DIAGNOSIS — E1165 Type 2 diabetes mellitus with hyperglycemia: Secondary | ICD-10-CM | POA: Diagnosis not present

## 2018-07-08 DIAGNOSIS — I2510 Atherosclerotic heart disease of native coronary artery without angina pectoris: Secondary | ICD-10-CM | POA: Insufficient documentation

## 2018-07-08 DIAGNOSIS — Z91148 Patient's other noncompliance with medication regimen for other reason: Secondary | ICD-10-CM

## 2018-07-08 DIAGNOSIS — R739 Hyperglycemia, unspecified: Secondary | ICD-10-CM

## 2018-07-08 DIAGNOSIS — E039 Hypothyroidism, unspecified: Secondary | ICD-10-CM | POA: Diagnosis not present

## 2018-07-08 DIAGNOSIS — R531 Weakness: Secondary | ICD-10-CM | POA: Diagnosis not present

## 2018-07-08 DIAGNOSIS — I5032 Chronic diastolic (congestive) heart failure: Secondary | ICD-10-CM | POA: Insufficient documentation

## 2018-07-08 DIAGNOSIS — R0602 Shortness of breath: Secondary | ICD-10-CM | POA: Insufficient documentation

## 2018-07-08 DIAGNOSIS — J45909 Unspecified asthma, uncomplicated: Secondary | ICD-10-CM | POA: Diagnosis not present

## 2018-07-08 DIAGNOSIS — L02214 Cutaneous abscess of groin: Secondary | ICD-10-CM | POA: Insufficient documentation

## 2018-07-08 LAB — URINALYSIS, ROUTINE W REFLEX MICROSCOPIC
Bilirubin Urine: NEGATIVE
Ketones, ur: 5 mg/dL — AB
Nitrite: NEGATIVE
PH: 5 (ref 5.0–8.0)
Protein, ur: NEGATIVE mg/dL
SPECIFIC GRAVITY, URINE: 1.028 (ref 1.005–1.030)

## 2018-07-08 LAB — CBC
HCT: 36.9 % (ref 36.0–46.0)
Hemoglobin: 11.9 g/dL — ABNORMAL LOW (ref 12.0–15.0)
MCH: 31.1 pg (ref 26.0–34.0)
MCHC: 32.2 g/dL (ref 30.0–36.0)
MCV: 96.3 fL (ref 78.0–100.0)
Platelets: 172 10*3/uL (ref 150–400)
RBC: 3.83 MIL/uL — ABNORMAL LOW (ref 3.87–5.11)
RDW: 12.6 % (ref 11.5–15.5)
WBC: 6.9 10*3/uL (ref 4.0–10.5)

## 2018-07-08 LAB — BASIC METABOLIC PANEL
Anion gap: 13 (ref 5–15)
BUN: 23 mg/dL (ref 8–23)
CO2: 20 mmol/L — ABNORMAL LOW (ref 22–32)
Calcium: 9.1 mg/dL (ref 8.9–10.3)
Chloride: 97 mmol/L — ABNORMAL LOW (ref 98–111)
Creatinine, Ser: 1.58 mg/dL — ABNORMAL HIGH (ref 0.44–1.00)
GFR calc Af Amer: 38 mL/min — ABNORMAL LOW (ref >60)
GFR calc non Af Amer: 33 mL/min — ABNORMAL LOW (ref >60)
Glucose, Bld: 593 mg/dL (ref 70–99)
Potassium: 4.9 mmol/L (ref 3.5–5.1)
Sodium: 130 mmol/L — ABNORMAL LOW (ref 135–145)

## 2018-07-08 LAB — CBG MONITORING, ED
Glucose-Capillary: 562 mg/dL (ref 70–99)
Glucose-Capillary: 467 mg/dL — ABNORMAL HIGH (ref 70–99)

## 2018-07-08 MED ORDER — SODIUM CHLORIDE 0.9 % IV BOLUS
500.0000 mL | Freq: Once | INTRAVENOUS | Status: AC
  Administered 2018-07-08: 500 mL via INTRAVENOUS

## 2018-07-08 MED ORDER — HYDROCODONE-ACETAMINOPHEN 5-325 MG PO TABS
2.0000 | ORAL_TABLET | Freq: Once | ORAL | Status: AC
  Administered 2018-07-08: 2 via ORAL
  Filled 2018-07-08: qty 2

## 2018-07-08 MED ORDER — INSULIN GLARGINE 100 UNIT/ML ~~LOC~~ SOLN
20.0000 [IU] | Freq: Once | SUBCUTANEOUS | Status: AC
  Administered 2018-07-08: 20 [IU] via SUBCUTANEOUS
  Filled 2018-07-08: qty 0.2

## 2018-07-08 MED ORDER — AMOXICILLIN-POT CLAVULANATE 875-125 MG PO TABS: 1.0000 | ORAL_TABLET | Freq: Two times a day (BID) | ORAL | 0 refills | Status: DC

## 2018-07-08 MED ORDER — SODIUM CHLORIDE 0.9 % IV SOLN
1.0000 g | Freq: Once | INTRAVENOUS | Status: AC
  Administered 2018-07-08: 1 g via INTRAVENOUS
  Filled 2018-07-08: qty 10

## 2018-07-08 MED ORDER — HYDROCODONE-ACETAMINOPHEN 5-325 MG PO TABS: 1.0000 | ORAL_TABLET | ORAL | 0 refills | Status: DC | PRN

## 2018-07-08 MED ORDER — SODIUM CHLORIDE 0.9 % IV BOLUS
1000.0000 mL | Freq: Once | INTRAVENOUS | Status: AC
  Administered 2018-07-08: 1000 mL via INTRAVENOUS

## 2018-07-08 MED ORDER — INSULIN PEN NEEDLE 30G X 8 MM MISC: 1.0000 | 1 refills | Status: DC | PRN

## 2018-07-08 NOTE — Discharge Instructions (Addendum)
Take your insulin as directed.  Prescriptions for needles were sent to your pharmacy along with prescriptions for pain medicine and antibiotic pills to treat the abscess.  For the infection in your groin soak in a warm tub 3 or 4 times a day.  Will need to cover the wound with a dressing because it will continue to drain blood and fluids.  This is expected and it will help the wound to heal.

## 2018-07-08 NOTE — ED Provider Notes (Signed)
Patient placed in Quick Look pathway, seen and evaluated   Chief Complaint: weakness, abscess  HPI:   Stephanie [name] is a 67 y.o. female who presents to the ED with c/o feeling weak and short of breath for the past 4 days. Patient also reports fever and chills and a boil to her vaginal area. Patient reports the boil is tender and she took Advil for pain prior to arrival. Patient reports that she was diagnosed with diabetes at her MD office and started on insulin but when she went to the drugstore to pick up the medication they told her she needed needles to go with it but the doctor needed to order. Patient reports she did not start the insulin because she didn't have the needles. Patient has not been checking her blood sugar at home.   ROS: General: fever, weakness  Skin: abscess of vaginal area  Resp: shortness of breath  Physical Exam:  BP (!) 120/57 (BP Location: Right Arm)   Pulse 62   Temp (!) 97.5 F (36.4 C) (Oral)   Resp 16   Ht 5\' 3"  (1.6 m)   Wt 117.5 kg   SpO2 99%   BMI 45.88 kg/m    Gen: No distress  Neuro: Awake and Alert  Skin: Warm and dry   Results for orders placed or performed during the hospital encounter of 07/08/18 (from the past 24 hour(s))  CBG monitoring, ED     Status: Abnormal   Collection Time: 07/08/18  3:50 PM  Result Value Ref Range   Glucose-Capillary 562 (HH) 70 - 99 mg/dL   Comment 1 Notify RN     Initiation of care has begun. The patient has been counseled on the process, plan, and necessity for staying for the completion/evaluation, and the remainder of the medical screening examination    Ashley Murrain, NP 07/08/18 1600    Sherwood Gambler, MD 07/09/18 [phone]

## 2018-07-08 NOTE — ED Notes (Signed)
Patient verbalizes understanding of discharge instructions. Opportunity for questioning and answers were provided. Armband removed by staff, pt discharged from ED.  

## 2018-07-08 NOTE — ED Triage Notes (Signed)
Pt reports weakness and sob that started about 3-4 days ago. Pt reports chills and feverish. Pt reports a boil to her vaginal area about the size of a pecan. Pt reports its swollen and hot to touch. Pt took two Advil before arrival for pain of 7/10.

## 2018-07-08 NOTE — ED Provider Notes (Signed)
[facility] EMERGENCY DEPARTMENT Provider Note   CSN: [id number] Arrival date & time: 07/08/18  1531     History   Chief Complaint Chief Complaint  Patient presents with  . Weakness  . Abscess  . Hyperglycemia    HPI Stephanie [name] is a 67 y.o. female.  HPI  She presents for evaluation of general weakness, some shortness of breath, fever, chills and a boil on her right groin.  She is supposed to be taking insulin but has not taken it for 2 weeks because she did not have prescription for needles.  She was recently changed from metformin to insulin, about 3 weeks ago, and does not take oral hypoglycemic agents at this time.  She denies cough, shortness of breath, nausea or vomiting.  There are no other known modifying factors.  Past Medical History:  Diagnosis Date  . Anemia   . Ankle fracture, right    x 2  . Antineoplastic and immunosuppressive drugs causing adverse effect in therapeutic use   . Asthma   . Chronic venous hypertension without complications   . Colon polyps 2009  . Coronary artery disease    no CAD by cath 11.2010  . Depressive disorder, not elsewhere classified   . Diabetes mellitus type 2 in obese (HCC)    hgb AIC 6.1 09/02/2009  . Diastolic CHF, chronic ([place])   . Diverticulosis of colon 2009   colonoscopy  . Gout   . Hepatitis, unspecified    hx of  . Hx of hysterectomy 2002  . Hyperlipidemia   . Hypertension   . Hypothyroidism   . Irritable bowel syndrome   . Lung nodule    49mm RLL nodule on CT Chest  07/2009, resolved by 2011 CT  . Noncompliance   . Obesity   . Persistent atrial fibrillation ([place])    s/p DCCV 07/2009; Pradaxa Rx  . PONV (postoperative nausea and vomiting)   . Sarcoidosis    dx by eye exam  . Sleep disturbance 06/2013   sleep study, mild abnormality, no criteria for CPAP    Patient Active Problem List   Diagnosis Date Noted  . Hyperglycemia 06/22/2018  . Polyuria 06/22/2018  . Polydipsia  06/22/2018  . Blurred vision 06/22/2018  . Weight loss 06/22/2018  . Medication side effect, initial encounter 03/23/2018  . Venous stasis dermatitis 03/09/2018  . Skin lesion 03/09/2018  . History of behavioral and mental health problems 03/02/2018  . Degenerative arthritis of knee, bilateral 12/22/2017  . Chronic pain of both knees 11/25/2017  . Abdominal pain 06/04/2017  . Nausea 06/04/2017  . Poor sleep hygiene 06/04/2017  . Noncompliance 06/04/2017  . Thyroid disease 05/19/2017  . Typical atrial flutter ([place])   . Thyrotoxicosis 11/24/2016  . Atrial fibrillation with rapid ventricular response ([place]) 11/12/2016  . Acute on chronic systolic CHF (congestive heart failure) ([place]) 10/18/2016  . Hypokalemia 10/18/2016  . Need for prophylactic vaccination against Streptococcus pneumoniae (pneumococcus) 08/20/2016  . IDDM (insulin dependent diabetes mellitus) ([place]) 05/12/2016  . Edema 05/12/2016  . B12 deficiency 05/06/2016  . Normocytic anemia 04/15/2016  . Insomnia 01/21/2016  . Pelvic pain in female 08/06/2015  . Screening for breast cancer 08/06/2015  . History of anemia 08/02/2015  . Other specified nutritional anemias 08/02/2015  . Urinary pain 08/02/2015  . Need for prophylactic vaccination and inoculation against influenza 08/02/2015  . Obesity 08/02/2015  . Dyslipidemia 08/02/2015  . History of sarcoidosis 08/02/2015  . Generalized anxiety disorder 08/02/2015  .  Gout 08/02/2015  . Abnormal thyroid blood test 08/02/2015  . Physical exam 08/02/2015  . Needs flu shot 08/02/2015  . Dysthymic disorder 08/02/2015  . Ataxia 08/22/2014  . Cardiomyopathy, secondary ([place]) 01/28/2011  . CHRONIC DIASTOLIC HEART FAILURE 29/51/8841  . Essential hypertension 09/02/2009  . Atrial fibrillation ([place]) 08/20/2009  . Lung nodule 08/20/2009  . CHRONIC VENOUS HYPERTENSION WITHOUT COMPS 11/21/2007    Past Surgical History:  Procedure Laterality Date  . ABDOMINAL HYSTERECTOMY    .  CARDIOVERSION     x 2  . CARDIOVERSION N/A 02/27/2013   Procedure: CARDIOVERSION;  Surgeon: Lelon Perla, MD;  Location: [facility] ENDOSCOPY;  Service: Cardiovascular;  Laterality: N/A;  . CARDIOVERSION N/A 03/24/2017   Procedure: CARDIOVERSION;  Surgeon: Josue Hector, MD;  Location: [facility] ENDOSCOPY;  Service: Cardiovascular;  Laterality: N/A;  . CHOLECYSTECTOMY    . COLONOSCOPY     Diverticulosis, colon polyps, repeat 2014, Dr. Deatra Ina  . ESOPHAGOGASTRODUODENOSCOPY N/A 05/16/2014   Procedure: ESOPHAGOGASTRODUODENOSCOPY (EGD);  Surgeon: Wonda Horner, MD;  Location: [facility] ORS;  Service: Gastroenterology;  Laterality: N/A;  . HERNIA REPAIR  2009     OB History   None      Home Medications    Prior to Admission medications   Medication Sig Start Date End Date Taking? Authorizing Provider  allopurinol (ZYLOPRIM) 300 MG tablet TAKE 300 MG BY MOUTH DAILY AS NEEDED FOR GOUT 09/08/17  Yes Tysinger, Camelia Eng, PA-C  ALPRAZolam Duanne Moron) 1 MG tablet Take 1 tablet (1 mg total) by mouth at bedtime as needed for anxiety. 11/25/17  Yes Tysinger, Camelia Eng, PA-C  carvedilol (COREG) 25 MG tablet TAKE 1 TABLET(25 MG) BY MOUTH TWICE DAILY 11/29/17  Yes Tysinger, Camelia Eng, PA-C  CINNAMON PO Take 2 capsules by mouth daily.   Yes [provider]  colchicine 0.6 MG tablet TAKE 1 TABLET BY MOUTH EVERY DAY AS NEEDED(GOUT) 04/12/18  Yes Tysinger, Camelia Eng, PA-C  dabigatran (PRADAXA) 150 MG CAPS capsule Take 1 capsule (150 mg total) by mouth 2 (two) times daily. 01/31/18  Yes Deboraha Sprang, MD  Diclofenac Sodium (PENNSAID) 2 % SOLN Use on knees topically as needed for pain   Yes [provider]  ENTRESTO 24-26 MG TAKE 1 TABLET BY MOUTH TWICE DAILY 02/23/18  Yes Tysinger, Camelia Eng, PA-C  furosemide (LASIX) 40 MG tablet Take 1.5 tablets (60 mg total) by mouth daily. Weight daily and take additional lasix 40 mg for weight gain of 2-3 pounds 03/04/18  Yes Adhikari, Tamsen Meek, MD  spironolactone (ALDACTONE) 25 MG tablet Take  1 tablet (25 mg total) by mouth daily. 03/25/18 07/08/18 Yes Deboraha Sprang, MD  terbinafine (LAMISIL) 1 % cream Apply 1 application topically 2 (two) times daily.   Yes [provider]  amoxicillin-clavulanate (AUGMENTIN) 875-125 MG tablet Take 1 tablet by mouth 2 (two) times daily. One po bid x 7 days 07/08/18   Daleen Bo, MD  HYDROcodone-acetaminophen [facility]) 5-325 MG tablet Take 1-2 tablets by mouth every 4 (four) hours as needed for moderate pain. 07/08/18   Daleen Bo, MD  Insulin Lispro (HUMALOG KWIKPEN) 200 UNIT/ML SOPN Inject 10 Units into the skin 3 (three) times daily with meals. Patient not taking: Reported on 07/08/2018 06/22/18   Tysinger, Camelia Eng, PA-C  Insulin Pen Needle (NOVOFINE) 30G X 8 MM MISC Inject 10 each into the skin as needed. 07/08/18   Daleen Bo, MD    Family History Family History  Problem Relation Age of Onset  .  Pneumonia Father        deceased  . Hypertension Father   . Cancer Father   . Multiple sclerosis Mother        hx  . Emphysema Other        runs in the family    Social History Social History   Tobacco Use  . Smoking status: Never Smoker  . Smokeless tobacco: Never Used  Substance Use Topics  . Alcohol use: No    Alcohol/week: 0.0 standard drinks  . Drug use: No     Allergies   Amiodarone; Atorvastatin; Livalo [pitavastatin]; Propoxyphene n-acetaminophen; and Albuterol   Review of Systems Review of Systems  All other systems reviewed and are negative.    Physical Exam Updated Vital Signs BP (!) 127/99   Pulse 87   Temp (!) 97.5 F (36.4 C) (Oral)   Resp 12   Ht 5\' 3"  (1.6 m)   Wt 117.5 kg   SpO2 99%   BMI 45.88 kg/m   Physical Exam  Constitutional: She is oriented to person, place, and time. She appears well-developed and well-nourished. She appears distressed (Uncomfortable).  HENT:  Head: Normocephalic and atraumatic.  Eyes: Pupils are equal, round, and reactive to light. Conjunctivae and EOM are  normal.  Neck: Normal range of motion and phonation normal. Neck supple.  Cardiovascular: Normal rate and regular rhythm.  Pulmonary/Chest: Effort normal and breath sounds normal. No respiratory distress. She exhibits no tenderness.  Abdominal: Soft. She exhibits no distension. There is no tenderness. There is no guarding.  Musculoskeletal: Normal range of motion. She exhibits no edema or deformity.  Neurological: She is alert and oriented to person, place, and time. She exhibits normal muscle tone.  Skin: Skin is warm and dry.  Red draining area, medial flexion crease of the right upper leg, at the groin, perineal region, not extending toward the vagina.  Area of induration is about 2 x 3 cm.  There is a defect measuring approximately 0.5 cm which is draining purulent material with blood.  There is moderate tenderness to this area.  There are no signs of deep tissue infection.  She is able to move the right hip.  Psychiatric: She has a normal mood and affect. Her behavior is normal. Judgment and thought content normal.  Nursing note and vitals reviewed.    ED Treatments / Results  Labs (all labs ordered are listed, but only abnormal results are displayed) Labs Reviewed  BASIC METABOLIC PANEL - Abnormal; Notable for the following components:      Result Value   Sodium 130 (*)    Chloride 97 (*)    CO2 20 (*)    Glucose, Bld 593 (*)    Creatinine, Ser 1.58 (*)    GFR calc non Af Amer 33 (*)    GFR calc Af Amer 38 (*)    All other components within normal limits  CBC - Abnormal; Notable for the following components:   RBC 3.83 (*)    Hemoglobin 11.9 (*)    All other components within normal limits  URINALYSIS, ROUTINE W REFLEX MICROSCOPIC - Abnormal; Notable for the following components:   APPearance HAZY (*)    Glucose, UA >=500 (*)    Hgb urine dipstick LARGE (*)    Ketones, ur 5 (*)    Leukocytes, UA SMALL (*)    Bacteria, UA FEW (*)    All other components within normal  limits  CBG MONITORING, ED - Abnormal; Notable for  the following components:   Glucose-Capillary 562 (*)    All other components within normal limits  CBG MONITORING, ED - Abnormal; Notable for the following components:   Glucose-Capillary 467 (*)    All other components within normal limits    EKG None  Radiology No results found.  Procedures .Critical Care Performed by: Daleen Bo, MD Authorized by: Daleen Bo, MD   Critical care provider statement:    Critical care time (minutes):  35   Critical care start time:  07/08/2018 4:30 PM   Critical care end time:  07/08/2018 8:00 PM   Critical care time was exclusive of:  Separately billable procedures and treating other patients   Critical care was time spent personally by me on the following activities:  Blood draw for specimens, development of treatment plan with patient or surrogate, discussions with consultants, evaluation of patient's response to treatment, examination of patient, obtaining history from patient or surrogate, ordering and performing treatments and interventions, ordering and review of laboratory studies, pulse oximetry, re-evaluation of patient's condition, review of old charts and ordering and review of radiographic studies   (including critical care time)  Medications Ordered in ED Medications  HYDROcodone-acetaminophen (NORCO/VICODIN) 5-325 MG per tablet 2 tablet (has no administration in time range)  sodium chloride 0.9 % bolus 500 mL (0 mLs Intravenous Stopped 07/08/18 1832)  cefTRIAXone (ROCEPHIN) 1 g in sodium chloride 0.9 % 100 mL IVPB (0 g Intravenous Stopped 07/08/18 1808)  sodium chloride 0.9 % bolus 1,000 mL (0 mLs Intravenous Stopped 07/08/18 1901)  insulin glargine (LANTUS) injection 20 Units (20 Units Subcutaneous Given 07/08/18 2022)  sodium chloride 0.9 % bolus 500 mL (0 mLs Intravenous Stopped 07/08/18 2154)     Initial Impression / Assessment and Plan / ED Course  I have reviewed the  triage vital signs and the nursing notes.  Pertinent labs & imaging results that were available during my care of the patient were reviewed by me and considered in my medical decision making (see chart for details).      Patient Vitals for the past 24 hrs:  BP Temp Temp src Pulse Resp SpO2 Height Weight  07/08/18 2100 (!) 127/99 - - 87 12 99 % - -  07/08/18 2030 (!) 149/86 - - 93 19 99 % - -  07/08/18 2000 (!) 135/92 - - (!) 50 16 96 % - -  07/08/18 1930 114/83 - - (!) 110 18 97 % - -  07/08/18 1900 (!) 131/96 - - (!) 54 13 97 % - -  07/08/18 1830 (!) 145/90 - - 86 13 97 % - -  07/08/18 1800 124/84 - - 85 16 100 % - -  07/08/18 1730 114/90 - - (!) 50 15 99 % - -  07/08/18 1700 109/64 - - (!) 107 20 95 % - -  07/08/18 1630 138/76 - - (!) 51 18 99 % - -  07/08/18 1624 (!) 157/65 - - (!) 57 16 99 % - -  07/08/18 1543 - - - - - - 5\' 3"  (1.6 m) 117.5 kg  07/08/18 1541 (!) 120/57 (!) 97.5 F (36.4 C) Oral 62 16 99 % - -    8:01 PM Reevaluation with update and discussion. After initial assessment and treatment, an updated evaluation reveals she is resting comfortably at this time.  Nursing has not yet administered the insulin.  Patient encouraged to eat and drink.Daleen Bo   Medical Decision Making: Hyperglycemia due to noncompliance. Patient  did not have needles to use for insulin administration. No ketosis. Draining pelvic abscess. Doubt deep tissue infection or sepsis.   CRITICAL CARE- yes Performed by: Daleen Bo  Nursing Notes Reviewed/ Care Coordinated Applicable Imaging Reviewed Interpretation of Laboratory Data incorporated into ED treatment  The patient appears reasonably screened and/or stabilized for discharge and I doubt any other medical condition or other [facility] requiring further screening, evaluation, or treatment in the ED at this time prior to discharge.  Plan: Home Medications- usual; Home Treatments- warm soaks, fluids; return here if the recommended treatment,  does not improve the symptoms; Recommended follow up- PCP 1 week and prn    Final Clinical Impressions(s) / ED Diagnoses   Final diagnoses:  Hyperglycemia  Noncompliance with medication regimen  Abscess    ED Discharge Orders         Ordered    Insulin Pen Needle (NOVOFINE) 30G X 8 MM MISC  As needed     07/08/18 2211    HYDROcodone-acetaminophen (NORCO) 5-325 MG tablet  Every 4 hours PRN     07/08/18 2211    amoxicillin-clavulanate (AUGMENTIN) 875-125 MG tablet  2 times daily     07/08/18 2211           Daleen Bo, MD 07/10/18 1011

## 2018-07-10 NOTE — Progress Notes (Signed)
[name] [address], [place] [address] Phone: [phone] Subjective:    I Stephanie [name] am serving as a Education administrator for Dr. Hulan Saas.   CC: Bilateral knee pain  TDV:[id number]  Stephanie [name] is a 67 y.o. female coming in with complaint of bilateral knee pain. States that her knees are doing well today. Recently seen in ER for high glucose.   Patient with bilateral knee pain.  Bone-on-bone arthritis.  Given injections 2 months ago.  Feels like overall the knees seem to be doing well.  Has had this appointment because she was supposed to follow-up.  States that they are not stopping her from any activities at this point.     Past Medical History:  Diagnosis Date  . Anemia   . Ankle fracture, right    x 2  . Antineoplastic and immunosuppressive drugs causing adverse effect in therapeutic use   . Asthma   . Chronic venous hypertension without complications   . Colon polyps 2009  . Coronary artery disease    no CAD by cath 11.2010  . Depressive disorder, not elsewhere classified   . Diabetes mellitus type 2 in obese (HCC)    hgb AIC 6.1 09/02/2009  . Diastolic CHF, chronic ([place])   . Diverticulosis of colon 2009   colonoscopy  . Gout   . Hepatitis, unspecified    hx of  . Hx of hysterectomy 2002  . Hyperlipidemia   . Hypertension   . Hypothyroidism   . Irritable bowel syndrome   . Lung nodule    71mm RLL nodule on CT Chest  07/2009, resolved by 2011 CT  . Noncompliance   . Obesity   . Persistent atrial fibrillation ([place])    s/p DCCV 07/2009; Pradaxa Rx  . PONV (postoperative nausea and vomiting)   . Sarcoidosis    dx by eye exam  . Sleep disturbance 06/2013   sleep study, mild abnormality, no criteria for CPAP   Past Surgical History:  Procedure Laterality Date  . ABDOMINAL HYSTERECTOMY    . CARDIOVERSION     x 2  . CARDIOVERSION N/A 02/27/2013   Procedure: CARDIOVERSION;  Surgeon: Lelon Perla, MD;  Location[facility]  ENDOSCOPY;  Service: Cardiovascular;  Laterality: N/A;  . CARDIOVERSION N/A 03/24/2017   Procedure: CARDIOVERSION;  Surgeon: Josue Hector, MD;  Location: [facility] ENDOSCOPY;  Service: Cardiovascular;  Laterality: N/A;  . CHOLECYSTECTOMY    . COLONOSCOPY     Diverticulosis, colon polyps, repeat 2014, Dr. Deatra Ina  . ESOPHAGOGASTRODUODENOSCOPY N/A 05/16/2014   Procedure: ESOPHAGOGASTRODUODENOSCOPY (EGD);  Surgeon: Wonda Horner, MD;  Location: [facility] ORS;  Service: Gastroenterology;  Laterality: N/A;  . HERNIA REPAIR  2009   Social History   Socioeconomic History  . Marital status: Single    Spouse name: Not on file  . Number of children: 5  . Years of education: college  . Highest education level: Not on file  Occupational History  . Occupation: Disabled.  Social Needs  . Financial resource strain: Not on file  . Food insecurity:    Worry: Not on file    Inability: Not on file  . Transportation needs:    Medical: Not on file    Non-medical: Not on file  Tobacco Use  . Smoking status: Never Smoker  . Smokeless tobacco: Never Used  Substance and Sexual Activity  . Alcohol use: No    Alcohol/week: 0.0 standard drinks  . Drug use: No  .  Sexual activity: Not Currently    Birth control/protection: Surgical  Lifestyle  . Physical activity:    Days per week: Not on file    Minutes per session: Not on file  . Stress: Not on file  Relationships  . Social connections:    Talks on phone: Not on file    Gets together: Not on file    Attends religious service: Not on file    Active member of club or organization: Not on file    Attends meetings of clubs or organizations: Not on file    Relationship status: Not on file  Other Topics Concern  . Not on file  Social History Narrative   Pt lives in [place] with her son.  Unemployed.  Right handed.     Education college   Allergies  Allergen Reactions  . Amiodarone Other (See Comments)    adverse reaction: Tremor/gait disurbance  .  Atorvastatin Other (See Comments)    Body aches and pains--stiffness.   [name] [Pitavastatin]     Body aches  . Propoxyphene N-Acetaminophen Nausea And Vomiting  . Albuterol Palpitations    Palpitations, intolerance   Family History  Problem Relation Age of Onset  . Pneumonia Father        deceased  . Hypertension Father   . Cancer Father   . Multiple sclerosis Mother        hx  . Emphysema Other        runs in the family    Current Outpatient Medications (Endocrine & Metabolic):  Marland Kitchen  Insulin Lispro (HUMALOG KWIKPEN) 200 UNIT/ML SOPN, Inject 10 Units into the skin 3 (three) times daily with meals.  Current Outpatient Medications (Cardiovascular):  .  carvedilol (COREG) 25 MG tablet, TAKE 1 TABLET(25 MG) BY MOUTH TWICE DAILY .  ENTRESTO 24-26 MG, TAKE 1 TABLET BY MOUTH TWICE DAILY .  furosemide (LASIX) 40 MG tablet, Take 1.5 tablets (60 mg total) by mouth daily. Weight daily and take additional lasix 40 mg for weight gain of 2-3 pounds .  spironolactone (ALDACTONE) 25 MG tablet, Take 1 tablet (25 mg total) by mouth daily.   Current Outpatient Medications (Analgesics):  .  allopurinol (ZYLOPRIM) 300 MG tablet, TAKE 300 MG BY MOUTH DAILY AS NEEDED FOR GOUT .  colchicine 0.6 MG tablet, TAKE 1 TABLET BY MOUTH EVERY DAY AS NEEDED(GOUT) .  HYDROcodone-acetaminophen (NORCO) 5-325 MG tablet, Take 1-2 tablets by mouth every 4 (four) hours as needed for moderate pain.  Current Outpatient Medications (Hematological):  .  dabigatran (PRADAXA) 150 MG CAPS capsule, Take 1 capsule (150 mg total) by mouth 2 (two) times daily.  Current Outpatient Medications (Other):  Marland Kitchen  ALPRAZolam (XANAX) 1 MG tablet, Take 1 tablet (1 mg total) by mouth at bedtime as needed for anxiety. Marland Kitchen  amoxicillin-clavulanate (AUGMENTIN) 875-125 MG tablet, Take 1 tablet by mouth 2 (two) times daily. One po bid x 7 days .  CINNAMON PO, Take 2 capsules by mouth daily. .  Diclofenac Sodium (PENNSAID) 2 % SOLN, Use on knees  topically as needed for pain .  Insulin Pen Needle (NOVOFINE) 30G X 8 MM MISC, Inject 10 each into the skin as needed. .  terbinafine (LAMISIL) 1 % cream, Apply 1 application topically 2 (two) times daily.    Past medical history, social, surgical and family history all reviewed in electronic medical record.  No pertanent information unless stated regarding to the chief complaint.   Review of Systems:  No headache, visual  changes, nausea, vomiting, diarrhea, constipation, dizziness, abdominal pain, skin rash, fevers, chills, night sweats, weight loss, swollen lymph nodes, body aches, joint swelling,   Positive muscle aches  Objective  Blood pressure 120/60, pulse (!) 106, height 5\' 3"  (1.6 m), weight 260 lb (117.9 kg), SpO2 97 %.    General: No apparent distress alert and oriented x3 mood and affect normal, dressed appropriately.  HEENT: Pupils equal, extraocular movements intact  Respiratory: Patient's speak in full sentences and does not appear short of breath  Cardiovascular: No lower extremity edema, non tender, no erythema  Skin: Warm dry intact with no signs of infection or rash on extremities or on axial skeleton.  Abdomen: Soft nontender  Neuro: Cranial nerves II through XII are intact, neurovascularly intact in all extremities with 2+ DTRs and 2+ pulses.  Lymph: No lymphadenopathy of posterior or anterior cervical chain or axillae bilaterally.  Gait normal with good balance and coordination.  MSK:  Non tender with full range of motion and good stability and symmetric strength and tone of shoulders, elbows, wrist, hip, and ankles bilaterally.  Knee: Bilateral valgus deformity noted. Large thigh to calf ratio.  Tender to palpation over medial and PF joint line.  ROM full in flexion and extension and lower leg rotation. instability with valgus force.  painful patellar compression. Patellar glide with moderate crepitus. Patellar and quadriceps tendons unremarkable. Hamstring and  quadriceps strength is normal.     Impression and Recommendations:     This case required medical decision making of moderate complexity. The above documentation has been reviewed and is accurate and complete Lyndal Pulley, DO       Note: This dictation was prepared with Dragon dictation along with smaller phrase technology. Any transcriptional errors that result from this process are unintentional.

## 2018-07-11 ENCOUNTER — Ambulatory Visit (INDEPENDENT_AMBULATORY_CARE_PROVIDER_SITE_OTHER): Payer: Medicare Other | Admitting: Family Medicine

## 2018-07-11 ENCOUNTER — Encounter: Payer: Self-pay | Admitting: Family Medicine

## 2018-07-11 DIAGNOSIS — M170 Bilateral primary osteoarthritis of knee: Secondary | ICD-10-CM

## 2018-07-11 NOTE — Patient Instructions (Addendum)
Good to see you  Stephanie [name] is your friend Stay active.  Glad the knees are doing well  pennsaid pinkie amount topically 2 times daily as needed.   Make an appointment again in 2 months just in case.

## 2018-07-11 NOTE — Assessment & Plan Note (Signed)
Patient feels like she is doing relatively well.  Encourage patient to continue the home exercise, icing regimen, which activities to do which wants to avoid.  Patient will follow-up with me again in 6 weeks for further evaluation and treatment.

## 2018-07-12 ENCOUNTER — Other Ambulatory Visit: Payer: Self-pay | Admitting: *Deleted

## 2018-07-12 NOTE — Patient Outreach (Signed)
[facility]) Care Management   07/12/2018  Eyla Tallon 14-Feb-1951 [id number]  Jonisha Kindig is an 67 y.o. female  Subjective:  HF: Pt reports she continue to weight daily and reports her weights on yesterday at 260 lbs, today 258 lbs and last week at 256 lbs with no signs or symptoms to her HF. Pt has verified she remains in the GREEN zone with no swelling today to her LE. DIABETES: Reports recent issues related to her diabetes. States she has now started taking her insulin and currently an appointment with a endocrinologist to follow instead of her primary provider. Pt is well aware of when to contact her provider with abnormal readings and states she has been managing well. No other issues reported concerning this condition.  EDEMA: Pt states minimal today however continue to have some swelling by the end of the day. Pt also reports she has a boil inner upper thigh area and taking antibiotic. Aware who to call if no improvement over the next 2 weeks. Indicated this is why she visited the ED recently.   Objective:   Review of Systems  All other systems reviewed and are negative.   Physical Exam  Constitutional: She is oriented to person, place, and time. She appears well-developed and well-nourished.  HENT:  Right Ear: External ear normal.  Left Ear: External ear normal.  Eyes: EOM are normal.  Neck: Normal range of motion.  Cardiovascular: Normal heart sounds.  Respiratory: Effort normal and breath sounds normal.  GI: Soft. Bowel sounds are normal.  Musculoskeletal: Normal range of motion.  Neurological: She is alert and oriented to person, place, and time.  Skin: Skin is warm and dry.  Psychiatric: She has a normal mood and affect. Her behavior is normal. Judgment and thought content normal.    Encounter Medications:   Outpatient Encounter Medications as of 07/12/2018  Medication Sig  . allopurinol (ZYLOPRIM) 300 MG tablet TAKE 300 MG BY MOUTH DAILY AS  NEEDED FOR GOUT  . ALPRAZolam (XANAX) 1 MG tablet Take 1 tablet (1 mg total) by mouth at bedtime as needed for anxiety.  Marland Kitchen amoxicillin-clavulanate (AUGMENTIN) 875-125 MG tablet Take 1 tablet by mouth 2 (two) times daily. One po bid x 7 days  . carvedilol (COREG) 25 MG tablet TAKE 1 TABLET(25 MG) BY MOUTH TWICE DAILY  . CINNAMON PO Take 2 capsules by mouth daily.  . colchicine 0.6 MG tablet TAKE 1 TABLET BY MOUTH EVERY DAY AS NEEDED(GOUT)  . dabigatran (PRADAXA) 150 MG CAPS capsule Take 1 capsule (150 mg total) by mouth 2 (two) times daily.  . Diclofenac Sodium (PENNSAID) 2 % SOLN Use on knees topically as needed for pain  . ENTRESTO 24-26 MG TAKE 1 TABLET BY MOUTH TWICE DAILY  . furosemide (LASIX) 40 MG tablet Take 1.5 tablets (60 mg total) by mouth daily. Weight daily and take additional lasix 40 mg for weight gain of 2-3 pounds (Patient taking differently: Take 60 mg by mouth daily. Weight daily and take additional lasix 40 mg for weight gain of 2-3 pounds  Pt takes differently (1 tab (40 mg) daily))  . Insulin Lispro (HUMALOG KWIKPEN) 200 UNIT/ML SOPN Inject 10 Units into the skin 3 (three) times daily with meals.  . Insulin Pen Needle (NOVOFINE) 30G X 8 MM MISC Inject 10 each into the skin as needed.  . terbinafine (LAMISIL) 1 % cream Apply 1 application topically 2 (two) times daily.  Marland Kitchen HYDROcodone-acetaminophen (NORCO) 5-325 MG tablet Take 1-2 tablets  by mouth every 4 (four) hours as needed for moderate pain. (Patient not taking: Reported on 07/12/2018)  . spironolactone (ALDACTONE) 25 MG tablet Take 1 tablet (25 mg total) by mouth daily.   No facility-administered encounter medications on file as of 07/12/2018.     Functional Status:   In your present state of health, do you have any difficulty performing the following activities: 03/16/2018 03/02/2018  Hearing? N N  Vision? N N  Difficulty concentrating or making decisions? N N  Walking or climbing stairs? Y Y  Dressing or bathing? N N   Doing errands, shopping? N N  Preparing Food and eating ? N -  Using the Toilet? N -  In the past six months, have you accidently leaked urine? N -  Do you have problems with loss of bowel control? N -  Managing your Medications? Y -  Managing your Finances? N -  Housekeeping or managing your Housekeeping? N -  Some recent data might be hidden    Fall/Depression Screening:    Fall Risk  03/16/2018 03/30/2017 09/23/2015  Falls in the past year? No Yes No  Number falls in past yr: - 1 -  Injury with Fall? - No -  Follow up - Education provided -   [facility] 2/9 Scores 03/16/2018 03/30/2017 09/23/2015  PHQ - 2 Score 0 0 0   BP 114/70 (BP Location: Right Arm, Patient Position: Sitting, Cuff Size: Normal)   Pulse 88   Resp 20   Wt 258 lb (117 kg)   SpO2 97%   BMI 45.70 kg/m    Assessment:   Ongoing case management related to HF Follow bilateral edema Diabetes related to recently starting insulin  Plan:  Willl verify pt remains in the GREEN zone with no symptoms. Pt able to recite some symptoms and what to do if acute symptoms occur. Verified pt has not gained 3 lbs over night or 5 lbs over the one week. Will continue to encouraged ongoing self management.of care with daily weights and when to seek medical attention with acute symptoms. Will verify pt continue to due well managing her HF with no acute events or issues. Will verify pt continues to elevate her LE to reduce ongoing swelling.   Plan of care discussed with ongoing goals and interventions adjusted according to pt progress.  Will inquired further on pt's knowledge related to diabetes and offered to educate further (decline). Will verify pt continue to manage this condition with no needs. Will encouraged adherence accordingly.  Will update primary provider this month of pt's disposition with [facility] services for ongoing community case management services. Will follow up next month telephonically prior to referring to a health coach.    [facility] CM Care Plan Problem Two     Most Recent Value  Care Plan Problem Two  Lack of knowledge related HF  Role Documenting the Problem Two  Care Management Coordinator  Care Plan for Problem Two  Active  [facility] CM Short Term Goal #1   Pt will have an increase knowledge base and complete her daily weight within the next 30 days.   [facility] CM Short Term Goal #1 Start Date  03/16/18  [facility] CM Short Term Goal #1 Met Date   07/12/18  [facility] CM Short Term Goal #2   Pt will elevate her LE to reduce ongoing swelling over the next 30 days  [facility] CM Short Term Goal #2 Start Date  05/13/18  Interventions for Short Term Goal #2  Will continue to extend to allow ongoing adherent with her daily monitoring with reducing her bilateral swelling. Will follow up next month telephonically prior to d/c to [facility] for ongoing disease management.      [name], RN Care Management Coordinator [place] Office [phone]

## 2018-07-13 ENCOUNTER — Encounter: Payer: Self-pay | Admitting: *Deleted

## 2018-08-10 ENCOUNTER — Other Ambulatory Visit: Payer: Self-pay | Admitting: *Deleted

## 2018-08-10 NOTE — Patient Outreach (Signed)
[facility]) Care Management   08/10/2018  Evin Chirco 1951/01/20 [id number]  Stephanie [name] is an 67 y.o. female  Subjective:  HF: Pt doing well and continue to manage and aware when to contact her provider with any acute symptoms. EDEMA: Pt reports no additional swelling to her LE. Pt reports she continue to elevate as needed and takes her prescribed medication.  Objective:   Review of Systems  All other systems reviewed and are negative.   Physical Exam  Constitutional: She is oriented to person, place, and time. She appears well-developed and well-nourished.  HENT:  Right Ear: External ear normal.  Left Ear: External ear normal.  Eyes: EOM are normal.  Neck: Normal range of motion.  Cardiovascular: Normal heart sounds.  Respiratory: Effort normal and breath sounds normal.  GI: Soft. Bowel sounds are normal.  Musculoskeletal: Normal range of motion.  Neurological: She is alert and oriented to person, place, and time.  Skin: Skin is warm and dry.  Psychiatric: She has a normal mood and affect. Her behavior is normal. Judgment and thought content normal.    Encounter Medications:   Outpatient Encounter Medications as of 08/10/2018  Medication Sig  . ALPRAZolam (XANAX) 1 MG tablet Take 1 tablet (1 mg total) by mouth at bedtime as needed for anxiety.  . carvedilol (COREG) 25 MG tablet TAKE 1 TABLET(25 MG) BY MOUTH TWICE DAILY  . CINNAMON PO Take 2 capsules by mouth daily.  . colchicine 0.6 MG tablet TAKE 1 TABLET BY MOUTH EVERY DAY AS NEEDED(GOUT)  . dabigatran (PRADAXA) 150 MG CAPS capsule Take 1 capsule (150 mg total) by mouth 2 (two) times daily.  . Diclofenac Sodium (PENNSAID) 2 % SOLN Use on knees topically as needed for pain  . ENTRESTO 24-26 MG TAKE 1 TABLET BY MOUTH TWICE DAILY  . furosemide (LASIX) 40 MG tablet Take 1.5 tablets (60 mg total) by mouth daily. Weight daily and take additional lasix 40 mg for weight gain of 2-3 pounds (Patient  taking differently: Take 60 mg by mouth daily. Weight daily and take additional lasix 40 mg for weight gain of 2-3 pounds  Pt takes differently (1 tab (40 mg) daily))  . Insulin Lispro (HUMALOG KWIKPEN) 200 UNIT/ML SOPN Inject 10 Units into the skin 3 (three) times daily with meals.  . Insulin Pen Needle (NOVOFINE) 30G X 8 MM MISC Inject 10 each into the skin as needed.  Marland Kitchen allopurinol (ZYLOPRIM) 300 MG tablet TAKE 300 MG BY MOUTH DAILY AS NEEDED FOR GOUT (Patient not taking: Reported on 08/10/2018)  . amoxicillin-clavulanate (AUGMENTIN) 875-125 MG tablet Take 1 tablet by mouth 2 (two) times daily. One po bid x 7 days (Patient not taking: Reported on 08/10/2018)  . HYDROcodone-acetaminophen (NORCO) 5-325 MG tablet Take 1-2 tablets by mouth every 4 (four) hours as needed for moderate pain. (Patient not taking: Reported on 08/10/2018)  . spironolactone (ALDACTONE) 25 MG tablet Take 1 tablet (25 mg total) by mouth daily.  Marland Kitchen terbinafine (LAMISIL) 1 % cream Apply 1 application topically 2 (two) times daily.   No facility-administered encounter medications on file as of 08/10/2018.     Functional Status:   In your present state of health, do you have any difficulty performing the following activities: 03/16/2018 03/02/2018  Hearing? N N  Vision? N N  Difficulty concentrating or making decisions? N N  Walking or climbing stairs? Y Y  Dressing or bathing? N N  Doing errands, shopping? N N  Conservation officer, nature and  eating ? N -  Using the Toilet? N -  In the past six months, have you accidently leaked urine? N -  Do you have problems with loss of bowel control? N -  Managing your Medications? Y -  Managing your Finances? N -  Housekeeping or managing your Housekeeping? N -  Some recent data might be hidden    Fall/Depression Screening:    Fall Risk  03/16/2018 03/30/2017 09/23/2015  Falls in the past year? No Yes No  Number falls in past yr: - 1 -  Injury with Fall? - No -  Follow up - Education  provided -   [facility] 2/9 Scores 03/16/2018 03/30/2017 09/23/2015  PHQ - 2 Score 0 0 0   BP 130/80 (BP Location: Right Arm, Patient Position: Sitting, Cuff Size: Normal)   Pulse 88   Resp 20   Ht 1.6 m ('5\' 3"' )   Wt 257 lb (116.6 kg)   SpO2 98%   BMI 45.53 kg/m  Assessment:   Case management related to CHF (managed) Follow up on bilateral edema   Plan:  Will discussed the current plan of care concerning goals and interventions. Will discussed CHF and verify pt remains in the GREEN zone with no acute symptoms. Will verify no additional swelling to her LE as pt elevated her LE as needed. Will verify pt continues to take her HF medications with no issues.  Plan of review and verified pt has met all her goals with no additional issues. Will notify her provider that pt will be graduating from the [facility] program.  [place] Problem One     Most Recent Value  Care Plan for Problem One  Not Active    [facility] CM Care Plan Problem Two     Most Recent Value  Care Plan Problem Two  Lack of knowledge related HF  Role Documenting the Problem Two  Care Management Coordinator  Care Plan for Problem Two  Not Active  [facility] CM Short Term Goal #2   Pt will elevate her LE to reduce ongoing swelling over the next 30 days  [facility] CM Short Term Goal #2 Start Date  05/13/18  [facility] CM Short Term Goal #2 Met Date  08/03/18      [name], RN Care Management Coordinator [place] Office [phone]

## 2018-08-16 ENCOUNTER — Telehealth: Payer: Self-pay

## 2018-08-16 NOTE — Telephone Encounter (Signed)
lmom asking patient to call the office to schedule her Diabetes Follow up.

## 2018-08-22 ENCOUNTER — Ambulatory Visit (INDEPENDENT_AMBULATORY_CARE_PROVIDER_SITE_OTHER): Payer: Medicare Other | Admitting: Medical

## 2018-08-22 ENCOUNTER — Telehealth: Payer: Self-pay | Admitting: Medical

## 2018-08-22 ENCOUNTER — Encounter: Payer: Self-pay | Admitting: Medical

## 2018-08-22 VITALS — BP 130/80 | HR 68 | Temp 97.9°F | Resp 16 | Ht 63.0 in | Wt 262.8 lb

## 2018-08-22 DIAGNOSIS — Z23 Encounter for immunization: Secondary | ICD-10-CM | POA: Diagnosis not present

## 2018-08-22 DIAGNOSIS — Z9119 Patient's noncompliance with other medical treatment and regimen: Secondary | ICD-10-CM | POA: Diagnosis not present

## 2018-08-22 DIAGNOSIS — E118 Type 2 diabetes mellitus with unspecified complications: Secondary | ICD-10-CM

## 2018-08-22 DIAGNOSIS — Z91199 Patient's noncompliance with other medical treatment and regimen due to unspecified reason: Secondary | ICD-10-CM

## 2018-08-22 LAB — POCT GLYCOSYLATED HEMOGLOBIN (HGB A1C): HEMOGLOBIN A1C: 11.8 % — AB (ref 4.0–5.6)

## 2018-08-22 LAB — POCT CBG (FASTING - GLUCOSE)-MANUAL ENTRY: GLUCOSE FASTING, POC: 310 mg/dL — AB (ref 70–99)

## 2018-08-22 MED ORDER — INSULIN PEN NEEDLE 30G X 8 MM MISC: 11 refills | Status: DC

## 2018-08-22 MED ORDER — INSULIN PEN NEEDLE 30G X 8 MM MISC: 1.0000 | 11 refills | Status: DC | PRN

## 2018-08-22 MED ORDER — INSULIN LISPRO 200 UNIT/ML ~~LOC~~ SOPN: 13.0000 [IU] | PEN_INJECTOR | Freq: Three times a day (TID) | SUBCUTANEOUS | 2 refills | Status: DC

## 2018-08-22 NOTE — Telephone Encounter (Signed)
error 

## 2018-08-22 NOTE — Progress Notes (Addendum)
Subjective: Chief Complaint  Patient presents with  . dm    DM check fasting  flu shot   Here for med check.  Also here for flu shot.  I last saw her September 4 for uncontrolled blood sugars and need to go on insulin.    Since then she has had an emergency department visit for hyperglycemia.  Prior to last visit she had been eating a bunch of sweets around her birthday and sugars were going out of control.   She had prior been on Metformin but wasn't good about taking this.   Last visit we counseled on importance of glucose control, medication and treatment compliance, healthy diet.  Last visit I had also advised she come back within 3 to 4 days for short-term follow-up on high glucose readings which she never did.  To keep things simple last time we started out with using Humalog mealtime 10 units before meals.  She currently reports checking glucose in afternoon sometimes, sometimes before lunch.  Not checking fasting.  Can't really give me a clear answer on when and how she is checking sugars.  Still using 10 units BID as she is only eating 2 meals daily.  Sometimes forgets to take her insulin shot.    She had a complaint that insulin needles were not prescribed on the day she started Humalog back in September.  She reports that both her and the pharmacy called here multiple times trying to get prescription sent to know avail.   (of note, we initially gave her Humalog starter sample pack, needle samples, and hand written script for needles on 06/22/18 visit).      Past Medical History:  Diagnosis Date  . Anemia   . Ankle fracture, right    x 2  . Antineoplastic and immunosuppressive drugs causing adverse effect in therapeutic use   . Asthma   . Chronic venous hypertension without complications   . Colon polyps 2009  . Coronary artery disease    no CAD by cath 11.2010  . Depressive disorder, not elsewhere classified   . Diabetes mellitus type 2 in obese (HCC)    hgb AIC 6.1  09/02/2009  . Diastolic CHF, chronic ([place])   . Diverticulosis of colon 2009   colonoscopy  . Gout   . Hepatitis, unspecified    hx of  . Hx of hysterectomy 2002  . Hyperlipidemia   . Hypertension   . Hypothyroidism   . Irritable bowel syndrome   . Lung nodule    9mm RLL nodule on CT Chest  07/2009, resolved by 2011 CT  . Noncompliance   . Obesity   . Persistent atrial fibrillation    s/p DCCV 07/2009; Pradaxa Rx  . PONV (postoperative nausea and vomiting)   . Sarcoidosis    dx by eye exam  . Sleep disturbance 06/2013   sleep study, mild abnormality, no criteria for CPAP   Current Outpatient Medications on File Prior to Visit  Medication Sig Dispense Refill  . ALPRAZolam (XANAX) 1 MG tablet Take 1 tablet (1 mg total) by mouth at bedtime as needed for anxiety. 45 tablet 1  . carvedilol (COREG) 25 MG tablet TAKE 1 TABLET(25 MG) BY MOUTH TWICE DAILY 180 tablet 1  . colchicine 0.6 MG tablet TAKE 1 TABLET BY MOUTH EVERY DAY AS NEEDED(GOUT) 30 tablet 0  . dabigatran (PRADAXA) 150 MG CAPS capsule Take 1 capsule (150 mg total) by mouth 2 (two) times daily. 60 capsule 5  . ENTRESTO  24-26 MG TAKE 1 TABLET BY MOUTH TWICE DAILY 180 tablet 0  . furosemide (LASIX) 40 MG tablet Take 1.5 tablets (60 mg total) by mouth daily. Weight daily and take additional lasix 40 mg for weight gain of 2-3 pounds (Patient taking differently: Take 60 mg by mouth daily. Weight daily and take additional lasix 40 mg for weight gain of 2-3 pounds  Pt takes differently (1 tab (40 mg) daily)) 45 tablet 6  . spironolactone (ALDACTONE) 25 MG tablet Take 1 tablet (25 mg total) by mouth daily. 90 tablet 3  . allopurinol (ZYLOPRIM) 300 MG tablet TAKE 300 MG BY MOUTH DAILY AS NEEDED FOR GOUT (Patient not taking: Reported on 08/10/2018) 90 tablet 2  . CINNAMON PO Take 2 capsules by mouth daily.    . Diclofenac Sodium (PENNSAID) 2 % SOLN Use on knees topically as needed for pain    . HYDROcodone-acetaminophen (NORCO) 5-325  MG tablet Take 1-2 tablets by mouth every 4 (four) hours as needed for moderate pain. (Patient not taking: Reported on 08/10/2018) 20 tablet 0  . terbinafine (LAMISIL) 1 % cream Apply 1 application topically 2 (two) times daily.     No current facility-administered medications on file prior to visit.    ROS as in subjective    Objective BP 130/80   Pulse 68   Temp 97.9 F (36.6 C) (Oral)   Resp 16   Ht 5\' 3"  (1.6 m)   Wt 262 lb 12.8 oz (119.2 kg)   SpO2 98%   BMI 46.55 kg/m   General appearance: alert, no distress, WD/WN,  otherwise not examined    Assessment: Encounter Diagnoses  Name Primary?  . Type 2 diabetes mellitus with complication, without long-term current use of insulin ([place]) Yes  . Need for influenza vaccination   . Noncompliance       Plan: Today I again reiterated the need for glucose monitoring to bring her glucose log again and to be compliant with treatment.  She does not have her glucose readings, she is not checking regularly, cannot give me a clear answer for her glucose monitoring.  It sounds like on average she is using insulin twice daily instead of 3 times daily as initially advised.  However she sometimes forgets to do mealtime insulin at all.  She had an accusatory tone with me today about the insulin needles and did not want to take responsibility for her role in terms of compliance with medication and glucose monitoring.  I did update her glucose sliding scale and insulin treatment recommendations today.  I called her pharmacy today, [facility] on [address] in [place].  They advised that they did in fact receive a prescription for needles back in September when we first started her on Humalog.  They advised that they had no record of phone calls here she request needles although there possibly is to call office if a prescription for insulin is received without prescription for needles.  We will go ahead and make a referral to  endocrinology today  I advised that in the future if there is any type of concern or discrepancy at the pharmacy or with any concerns about her care, to call and ask to speak with the office manager if she does not get resolution while talking with the front office or the nurse.  I did discuss with her that I see no evidence of phone calls since her last visit here other than regarding the prior authorization for Humalog.  I advised that if I was aware of a concern or problem, I would have worked to resolve the issue.  I refilled Humalog and needles today.  Addendum: After reviewing her chart, prior visits and the overall course of the last few visits, I think it is in her best interest to seek care elsewhere.   We will contact her about this, and recommend she establish care elsewhere for primary care.   Stephanie [name] was seen today for dm.  Diagnoses and all orders for this visit:  Type 2 diabetes mellitus with complication, without long-term current use of insulin (HCC) -     Glucose (CBG), Fasting -     HgB A1c -     Ambulatory referral to Endocrinology  Need for influenza vaccination -     Flu vaccine HIGH DOSE PF (Fluzone High Dose)  Noncompliance  Other orders -     Discontinue: Insulin Pen Needle (NOVOFINE) 30G X 8 MM MISC; Inject 10 each into the skin as needed. -     Insulin Lispro (HUMALOG KWIKPEN) 200 UNIT/ML SOPN; Inject 13 Units into the skin 3 (three) times daily with meals. -     Insulin Pen Needle (NOVOFINE) 30G X 8 MM MISC; For Humalog meal time TID

## 2018-08-22 NOTE — Addendum Note (Signed)
Addended by: Carlena Hurl on: 08/22/2018 03:28 PM   Modules accepted: Orders

## 2018-08-22 NOTE — Patient Instructions (Signed)
Your blood sugars are not under control so we need to make some changes today  We will refer you to a diabetes specialist  Please check your blood sugars 3 times daily, including fasting first thing in the morning when you get up, before lunch and before supper  I recommend you eat small meals throughout the day or at least 3 meals a day as opposed to the 2 meals a day you are currently doing   Increase your mealtime insulin to 13 units    Please use the sliding scale below Correction Insulin/Sliding Scale Your caregiver has decided you need insulin at home. You have been given a correctional scale (sliding scale) in case you need extra insulin when your blood sugar is too high (hyperglycemia). The following instructions will assist you in how to use that correctional scale.  WHAT IS A CORRECTIONAL SCALE (SLIDING SCALE)?  When you check your blood sugar, sometimes it will be higher than your caregiver wants it to be. You may need an extra dose of insulin to bring your blood sugar to your desired level (also known as your goal, target level, or normal level.) The correctional scale is prescribed by your caregiver based on your specific needs.   ______________________________________________________________________  INSULIN SLIDING SCALE   Use the chart below to determine the amount of your Humalog Insulin that you will use to control your meal time blood sugar.  If your glucose before meal is less than 60, drink 4 oz of orange juice or if able, eat a piece of candy and do not use the meal time dose of insulin  If your glucose before meal is 60 -100, don't use the meal time insulin for this meal If your glucose before meal is 101-150, use  8  units of Insulin  If your glucose before meal is 151-200, use  10  units of Insulin  If your glucose before meal is 201-250, use  13  units of Insulin If your glucose before meal is 251-300, use  16  units of Insulin If your glucose before  meal is 301-350, use  19  units of Insulin If your glucose before meal is 351-400, use  21  units of Insulin If your glucose before meal is 451-500, use  24  units of Insulin If your glucose before meal is >500, use 27 units of Insulin and call doctor immediately  ________________________________________________________________________    WHY IS IT IMPORTANT TO KEEP YOUR BLOOD SUGAR LEVELS AT YOUR DESIRED LEVEL?  It helps to prevent long-term complications of diabetes, such as eye disease, kidney failure, and other serious complications. WHAT TYPE OF INSULIN WILL YOU USE?  To help bring down blood sugars that are too high, your caregiver has prescribed a short-acting or a rapid-acting insulin. An example of a short-acting insulin would be Regular.  WHAT DO I NEED TO DO?   Check your blood sugar with your home blood glucose meter as recommended by your caregiver.  Using your correctional scale, find the range your blood sugar lies in.  Look for the units of insulin that matches the blood sugar range. Give yourself the dose of correctional insulin your caregiver has prescribed. Always make sure you are using the right type of insulin.  Prior to the injection make sure you have food available that you can eat in the next 15 to 30 minutes.  If your correctional insulin is rapid acting, start eating your meal within 15 minutes after you have given  yourself the insulin injection. If you wait longer than 15 minutes to eat, your blood sugar might get too low.  If your correctional insulin is short acting (Regular), start eating your meal within 30 minutes after you have given yourself the insulin injection. If you wait longer than 30 minutes to eat, your blood sugar might get too low. Symptoms of low blood sugar (hypoglycemia) may include feeling shaky or weak, sweating a lot, not thinking straight, difficulty seeing, agitation, or crankiness. Check your blood sugar immediately and treat your  results as directed by your caregiver.  Keep a log of your blood sugar results with the time you took the test and the amount of insulin that you injected. This information will help your caregiver manage your medications.  Note on your log anything that may affect your blood sugars such as:  Changes in normal exercise or activity.  Changes in your normal schedule, such as staying up late, going on vacation, changing your diet, or holidays.  New medications. This includes all medications. Some medications, even those that do not require a prescription, may cause high blood sugars.  Illness or stress.  Changes in when you actually took your medication.  Changes in your meals, such as skipping a meal, a late meal, or dining out.  Eating things that may affect blood glucose, such as snacks, larger meal portions than normal, or drinks with sugar.  Ask your caregiver any questions you have.

## 2018-08-24 ENCOUNTER — Telehealth: Payer: Self-pay | Admitting: Family Medicine

## 2018-08-24 ENCOUNTER — Other Ambulatory Visit: Payer: Self-pay

## 2018-08-24 DIAGNOSIS — E118 Type 2 diabetes mellitus with unspecified complications: Secondary | ICD-10-CM

## 2018-08-24 NOTE — Telephone Encounter (Signed)
I have put in both referrals in Epic

## 2018-08-24 NOTE — Telephone Encounter (Signed)
Pt called she wanted to know about the endo and GI appt.  I advised Jenny Reichmann is working on both of those for her and those offices should call her.

## 2018-08-24 NOTE — Telephone Encounter (Signed)
Please document if patient was called and advised.

## 2018-08-25 ENCOUNTER — Ambulatory Visit: Payer: Medicare Other | Admitting: Family Medicine

## 2018-08-29 ENCOUNTER — Other Ambulatory Visit: Payer: Self-pay | Admitting: Medical

## 2018-09-21 ENCOUNTER — Other Ambulatory Visit (HOSPITAL_COMMUNITY): Payer: Medicare Other

## 2018-09-22 ENCOUNTER — Encounter: Payer: Self-pay | Admitting: Gastroenterology

## 2018-09-22 ENCOUNTER — Ambulatory Visit (INDEPENDENT_AMBULATORY_CARE_PROVIDER_SITE_OTHER): Payer: Medicare Other | Admitting: Gastroenterology

## 2018-09-22 VITALS — BP 152/84 | HR 68 | Ht 63.0 in | Wt 272.1 lb

## 2018-09-22 DIAGNOSIS — Z7901 Long term (current) use of anticoagulants: Secondary | ICD-10-CM

## 2018-09-22 DIAGNOSIS — I5022 Chronic systolic (congestive) heart failure: Secondary | ICD-10-CM | POA: Diagnosis not present

## 2018-09-22 DIAGNOSIS — Z1211 Encounter for screening for malignant neoplasm of colon: Secondary | ICD-10-CM | POA: Diagnosis not present

## 2018-09-22 NOTE — Progress Notes (Signed)
HPI :  67 year old female with history of CHF, last EF 15-20% May 2019, atrial fibrillation on Pradaxa, BMI of 48, here for a follow up visit to discuss colon cancer screening  Her last colonoscopy was performed in March 2009, no adenomatous polyps, otherwise noted to have diverticulosis. She denies any known family history of colon cancer. She typically has one bowel movement per day. She is not taking anything for her bowel function at this time. She denies any blood in her stools. No abdominal pains. Over the summer she had some severe diarrhea that resolved on its own. She continues to take Pradaxa for her AF. She follows with Dr. [name] of cardiology, she denies any cardiopulmonary symptoms at this time. She is scheduled for an echocardiogram next week and a follow-up visit with her cardiologist. She otherwise has no complaints today.  Endoscopic history: EGD 05/16/14 - normal without pathology to explain symptoms of possible food impaction Colonoscopy 12/26/07 - 30mm benign descending colon polyp - benign no adenoma, diverticulosis   Echocardiogram 03/03/2018 - EF 15-20%    Past Medical History:  Diagnosis Date  . Anemia   . Ankle fracture, right    x 2  . Antineoplastic and immunosuppressive drugs causing adverse effect in therapeutic use   . Asthma   . Chronic venous hypertension without complications   . Colon polyps 2009  . Coronary artery disease    no CAD by cath 11.2010  . Depressive disorder, not elsewhere classified   . Diabetes mellitus type 2 in obese (HCC)    hgb AIC 6.1 09/02/2009  . Diastolic CHF, chronic ([place])   . Diverticulosis of colon 2009   colonoscopy  . Gout   . Hepatitis, unspecified    hx of  . Hx of hysterectomy 2002  . Hyperlipidemia   . Hypertension   . Hypothyroidism   . Irritable bowel syndrome   . Lung nodule    11mm RLL nodule on CT Chest  07/2009, resolved by 2011 CT  . Noncompliance   . Obesity   . Persistent atrial fibrillation    s/p  DCCV 07/2009; Pradaxa Rx  . PONV (postoperative nausea and vomiting)   . Sarcoidosis    dx by eye exam  . Sleep disturbance 06/2013   sleep study, mild abnormality, no criteria for CPAP     Past Surgical History:  Procedure Laterality Date  . ABDOMINAL HYSTERECTOMY    . CARDIOVERSION     x 2  . CARDIOVERSION N/A 02/27/2013   Procedure: CARDIOVERSION;  Surgeon: Lelon Perla, MD;  Location: [facility] ENDOSCOPY;  Service: Cardiovascular;  Laterality: N/A;  . CARDIOVERSION N/A 03/24/2017   Procedure: CARDIOVERSION;  Surgeon: Josue Hector, MD;  Location: [facility] ENDOSCOPY;  Service: Cardiovascular;  Laterality: N/A;  . CHOLECYSTECTOMY    . COLONOSCOPY     Diverticulosis, colon polyps, repeat 2014, Dr. Deatra Ina  . ESOPHAGOGASTRODUODENOSCOPY N/A 05/16/2014   Procedure: ESOPHAGOGASTRODUODENOSCOPY (EGD);  Surgeon: Wonda Horner, MD;  Location: [facility] ORS;  Service: Gastroenterology;  Laterality: N/A;  . HERNIA REPAIR  2009   Family History  Problem Relation Age of Onset  . Pneumonia Father        deceased  . Hypertension Father   . Cancer Father   . Multiple sclerosis Mother        hx  . Emphysema Other        runs in the family   Social History   Tobacco Use  . Smoking status: Never Smoker  .  Smokeless tobacco: Never Used  Substance Use Topics  . Alcohol use: No    Alcohol/week: 0.0 standard drinks  . Drug use: No   Current Outpatient Medications  Medication Sig Dispense Refill  . ALPRAZolam (XANAX) 1 MG tablet Take 1 tablet (1 mg total) by mouth at bedtime as needed for anxiety. 45 tablet 1  . carvedilol (COREG) 25 MG tablet TAKE 1 TABLET(25 MG) BY MOUTH TWICE DAILY 180 tablet 1  . CINNAMON PO Take 2 capsules by mouth daily.    . colchicine 0.6 MG tablet TAKE 1 TABLET BY MOUTH EVERY DAY AS NEEDED(GOUT) 30 tablet 0  . Cyanocobalamin (VITAMIN B-12 PO) Take 2 tablets by mouth daily.    . dabigatran (PRADAXA) 150 MG CAPS capsule Take 1 capsule (150 mg total) by mouth 2 (two) times daily. 60  capsule 5  . Diclofenac Sodium (PENNSAID) 2 % SOLN Use on knees topically as needed for pain    . furosemide (LASIX) 40 MG tablet Take 1.5 tablets (60 mg total) by mouth daily. Weight daily and take additional lasix 40 mg for weight gain of 2-3 pounds (Patient taking differently: Take 60 mg by mouth daily. Weight daily and take additional lasix 40 mg for weight gain of 2-3 pounds  Pt takes differently (1 tab (40 mg) daily)) 45 tablet 6  . Insulin Lispro (HUMALOG KWIKPEN) 200 UNIT/ML SOPN Inject 13 Units into the skin 3 (three) times daily with meals. 3 mL 2  . Insulin Pen Needle (NOVOFINE) 30G X 8 MM MISC For Humalog meal time TID 100 each 11  . terbinafine (LAMISIL) 1 % cream Apply 1 application topically 2 (two) times daily.    Marland Kitchen allopurinol (ZYLOPRIM) 300 MG tablet TAKE 300 MG BY MOUTH DAILY AS NEEDED FOR GOUT (Patient not taking: Reported on 08/10/2018) 90 tablet 2   No current facility-administered medications for this visit.    Allergies  Allergen Reactions  . Amiodarone Other (See Comments)    adverse reaction: Tremor/gait disurbance  . Atorvastatin Other (See Comments)    Body aches and pains--stiffness.   [name] [Pitavastatin]     Body aches  . Propoxyphene N-Acetaminophen Nausea And Vomiting  . Albuterol Palpitations    Palpitations, intolerance     Review of Systems: All systems reviewed and negative except where noted in HPI.   Lab Results  Component Value Date   WBC 6.9 07/08/2018   HGB 11.9 (L) 07/08/2018   HCT 36.9 07/08/2018   MCV 96.3 07/08/2018   PLT 172 07/08/2018    Lab Results  Component Value Date   CREATININE 1.58 (H) 07/08/2018   BUN 23 07/08/2018   NA 130 (L) 07/08/2018   K 4.9 07/08/2018   CL 97 (L) 07/08/2018   CO2 20 (L) 07/08/2018    Lab Results  Component Value Date   ALT 15 03/01/2018   AST 13 03/01/2018   ALKPHOS 58 03/01/2018   BILITOT 0.4 03/01/2018     Physical Exam: BP (!) 152/84 (BP Location: Left Arm, Patient Position:  Sitting, Cuff Size: Large)   Pulse 68   Ht 5\' 3"  (1.6 m)   Wt 272 lb 2 oz (123.4 kg)   BMI 48.20 kg/m  Constitutional: Pleasant,well-developed, female in no acute distress. HEENT: Normocephalic and atraumatic. Conjunctivae are normal. No scleral icterus. Neck supple.  Cardiovascular: Normal rate, regular rhythm.  Pulmonary/chest: Effort normal and breath sounds normal. No wheezing, rales or rhonchi. Abdominal: Soft, nondistended, nontender. There are no masses palpable. No  hepatomegaly. Extremities: no edema Lymphadenopathy: No cervical adenopathy noted. Neurological: Alert and oriented to person place and time. Skin: Skin is warm and dry. No rashes noted. Psychiatric: Normal mood and affect. Behavior is normal.   ASSESSMENT AND PLAN: 67 year old female here for reassessment following issues:  Colon cancer screening / congestive heart failure / anticoagulated - she is here to discuss her options for colon cancer screening, as she is due for at this time. We discussed optical colonoscopy versus stool based testing, risks and benefits of each of these. She is higher than average risk for anesthesia given her ejection fraction, however has a follow-up echocardiogram scheduled for next week and she reports feeling much better, it's possible her ejection fraction is improved. After discussion of stool based testing versus colonoscopy she wants to have a colonoscopy pending her heart function stable. She denies any cardiopulmonary symptoms currently. I think we should wait for her echocardiogram to be done next week, see if that is improved or the same, and ensure no worsening prior to putting her through anesthesia. If her EF is 30% or less her colonoscopy will need to be done at the hospital. She is agreeable to that if needed. We will touch base with her in the next few weeks after she has her echocardiogram and follow-up Dr. [name] to ensure she is stable to proceed. She will need to hold her  Pradaxa prior to colonoscopy and will need approval to do that.  Dolton Cellar, MD [facility] Gastroenter[facility]

## 2018-09-22 NOTE — Patient Instructions (Signed)
If you are age 66 or older, your body mass index should be between 23-30. Your Body mass index is 48.2 kg/m. If this is out of the aforementioned range listed, please consider follow up with your Primary Care Provider.  If you are age 12 or younger, your body mass index should be between 19-25. Your Body mass index is 48.2 kg/m. If this is out of the aformentioned range listed, please consider follow up with your Primary Care Provider.   We will await your appointment with Dr. [name] on December 20th and your Echo to make a decision regarding scheduling your colonoscopy.  Thank you for entrusting me with your care and for choosing [facility], Dr. [place] Cellar

## 2018-09-23 ENCOUNTER — Other Ambulatory Visit (HOSPITAL_COMMUNITY): Payer: Medicare Other

## 2018-09-26 DIAGNOSIS — I10 Essential (primary) hypertension: Secondary | ICD-10-CM | POA: Diagnosis not present

## 2018-09-26 DIAGNOSIS — E1165 Type 2 diabetes mellitus with hyperglycemia: Secondary | ICD-10-CM | POA: Diagnosis not present

## 2018-09-26 DIAGNOSIS — N289 Disorder of kidney and ureter, unspecified: Secondary | ICD-10-CM | POA: Diagnosis not present

## 2018-09-26 DIAGNOSIS — E669 Obesity, unspecified: Secondary | ICD-10-CM | POA: Diagnosis not present

## 2018-09-26 DIAGNOSIS — E039 Hypothyroidism, unspecified: Secondary | ICD-10-CM | POA: Diagnosis not present

## 2018-09-29 ENCOUNTER — Other Ambulatory Visit: Payer: Self-pay

## 2018-09-29 ENCOUNTER — Ambulatory Visit (HOSPITAL_COMMUNITY): Payer: Medicare Other | Attending: Cardiovascular Disease

## 2018-09-29 DIAGNOSIS — I5043 Acute on chronic combined systolic (congestive) and diastolic (congestive) heart failure: Secondary | ICD-10-CM | POA: Diagnosis not present

## 2018-09-29 DIAGNOSIS — I4819 Other persistent atrial fibrillation: Secondary | ICD-10-CM | POA: Insufficient documentation

## 2018-09-29 DIAGNOSIS — I428 Other cardiomyopathies: Secondary | ICD-10-CM | POA: Insufficient documentation

## 2018-09-29 DIAGNOSIS — I10 Essential (primary) hypertension: Secondary | ICD-10-CM | POA: Diagnosis not present

## 2018-10-06 ENCOUNTER — Ambulatory Visit (INDEPENDENT_AMBULATORY_CARE_PROVIDER_SITE_OTHER): Payer: Medicare Other | Admitting: Internal Medicine

## 2018-10-06 ENCOUNTER — Encounter: Payer: Self-pay | Admitting: Internal Medicine

## 2018-10-06 VITALS — BP 134/76 | HR 70 | Ht 63.0 in | Wt 271.8 lb

## 2018-10-06 DIAGNOSIS — I5022 Chronic systolic (congestive) heart failure: Secondary | ICD-10-CM

## 2018-10-06 DIAGNOSIS — I428 Other cardiomyopathies: Secondary | ICD-10-CM | POA: Diagnosis not present

## 2018-10-06 DIAGNOSIS — I4819 Other persistent atrial fibrillation: Secondary | ICD-10-CM | POA: Diagnosis not present

## 2018-10-06 DIAGNOSIS — I10 Essential (primary) hypertension: Secondary | ICD-10-CM | POA: Diagnosis not present

## 2018-10-06 DIAGNOSIS — E0590 Thyrotoxicosis, unspecified without thyrotoxic crisis or storm: Secondary | ICD-10-CM

## 2018-10-06 NOTE — Patient Instructions (Signed)

## 2018-10-06 NOTE — Progress Notes (Signed)
Patient Care Team: Patient, No Pcp Per as PCP - General (General Practice)   HPI  Stephanie [name] is a 67 y.o. female Seen in followup for atrial fibrillation in the context of morbid obesity, sarcoid and mild left ventricular hypertrophy. Catheterization 2012 demonstrated nonobstructive coronary disease and normal left ventricular function and elevated filling pressures By echocardiogram, recurrent cardiomyopathy was demonstrated with EF 25-30% April 2014   She underwent cardioversion may/14 with the hopes of restoring sinus rhythm and seeing recovery of LV systolic function. She had rapidly reverted to afib and then spontaneously reverted to sinus rhythm. There has been  interval near normalization of LV systolic function confirmed by echo August 2014   She saw Dr. Greggory Brandy 3/15 to consider catheter ablation.  She was not interested. He reduced her amiodarone to 200 mg a day. Encouraged her to refrain from alcohol and to work on blood pressure and weight reduction.  She had a sleep study 2014 with an AHI of 7.5    She developed hyperthyroidism on amiodarone prompting its discontinuation.  She is now being followed by Dr. Buddy Duty and he is working both on her thyroid as well as her diabetes.   DATE TEST EF   3/12 Echo     25 %   8/14 Echo  55-60 %   12/17 Echo  25-30% LAE (47/2.0/45)  5/19 Echo 15-20%   12/19 Echo  55-65%    Holter 6/19  Mean HR 88 Range 40-182      Date TSH Cr K Hgb  6/17 1.9     1/18 <<0.01     3/18 2.2     6/18 7.4 1.1  12.6  5/19  1.09 4.6 11.1  9/19  1.58 4.9 11.8  112/19  1.42 5.2   . Dyspnea is improved.  Edema is less.  Trying to exercise.   Past Medical History:  Diagnosis Date  . Anemia   . Ankle fracture, right    x 2  . Antineoplastic and immunosuppressive drugs causing adverse effect in therapeutic use   . Asthma   . Chronic venous hypertension without complications   . Colon polyps 2009  . Coronary artery disease    no CAD by cath  11.2010  . Depressive disorder, not elsewhere classified   . Diabetes mellitus type 2 in obese (HCC)    hgb AIC 6.1 09/02/2009  . Diastolic CHF, chronic ([place])   . Diverticulosis of colon 2009   colonoscopy  . Gout   . Hepatitis, unspecified    hx of  . Hx of hysterectomy 2002  . Hyperlipidemia   . Hypertension   . Hypothyroidism   . Irritable bowel syndrome   . Lung nodule    53mm RLL nodule on CT Chest  07/2009, resolved by 2011 CT  . Noncompliance   . Obesity   . Persistent atrial fibrillation    s/p DCCV 07/2009; Pradaxa Rx  . PONV (postoperative nausea and vomiting)   . Sarcoidosis    dx by eye exam  . Sleep disturbance 06/2013   sleep study, mild abnormality, no criteria for CPAP    Past Surgical History:  Procedure Laterality Date  . ABDOMINAL HYSTERECTOMY    . CARDIOVERSION     x 2  . CARDIOVERSION N/A 02/27/2013   Procedure: CARDIOVERSION;  Surgeon: Lelon Perla, MD;  Location: [facility] ENDOSCOPY;  Service: Cardiovascular;  Laterality: N/A;  . CARDIOVERSION N/A 03/24/2017   Procedure: CARDIOVERSION;  Surgeon: Johnsie Cancel,  Wallis Bamberg, MD;  Location: [facility] ENDOSCOPY;  Service: Cardiovascular;  Laterality: N/A;  . CHOLECYSTECTOMY    . COLONOSCOPY     Diverticulosis, colon polyps, repeat 2014, Dr. Deatra Ina  . ESOPHAGOGASTRODUODENOSCOPY N/A 05/16/2014   Procedure: ESOPHAGOGASTRODUODENOSCOPY (EGD);  Surgeon: Wonda Horner, MD;  Location: [facility] ORS;  Service: Gastroenterology;  Laterality: N/A;  . HERNIA REPAIR  2009    Current Outpatient Medications  Medication Sig Dispense Refill  . allopurinol (ZYLOPRIM) 300 MG tablet TAKE 300 MG BY MOUTH DAILY AS NEEDED FOR GOUT 90 tablet 2  . ALPRAZolam (XANAX) 1 MG tablet Take 1 tablet (1 mg total) by mouth at bedtime as needed for anxiety. 45 tablet 1  . carvedilol (COREG) 25 MG tablet TAKE 1 TABLET(25 MG) BY MOUTH TWICE DAILY 180 tablet 1  . colchicine 0.6 MG tablet TAKE 1 TABLET BY MOUTH EVERY DAY AS NEEDED(GOUT) 30 tablet 0  . Cyanocobalamin  (VITAMIN B-12 PO) Take 2 tablets by mouth daily.    . dabigatran (PRADAXA) 150 MG CAPS capsule Take 1 capsule (150 mg total) by mouth 2 (two) times daily. 60 capsule 5  . Diclofenac Sodium (PENNSAID) 2 % SOLN Use on knees topically as needed for pain    . furosemide (LASIX) 40 MG tablet Take 1.5 tablets (60 mg total) by mouth daily. Weight daily and take additional lasix 40 mg for weight gain of 2-3 pounds (Patient taking differently: Take 60 mg by mouth daily. Weight daily and take additional lasix 40 mg for weight gain of 2-3 pounds  Pt takes differently (1 tab (40 mg) daily)) 45 tablet 6  . Insulin Lispro (HUMALOG KWIKPEN) 200 UNIT/ML SOPN Inject 13 Units into the skin 3 (three) times daily with meals. 3 mL 2  . Insulin Pen Needle (NOVOFINE) 30G X 8 MM MISC For Humalog meal time TID 100 each 11   No current facility-administered medications for this visit.     Allergies  Allergen Reactions  . Amiodarone Other (See Comments)    adverse reaction: Tremor/gait disurbance  . Atorvastatin Other (See Comments)    Body aches and pains--stiffness.   [name] [Pitavastatin]     Body aches  . Propoxyphene N-Acetaminophen Nausea And Vomiting  . Albuterol Palpitations    Palpitations, intolerance    Review of Systems negative except from HPI and PMH  Physical Exam BP 134/76   Pulse 70   Ht 5\' 3"  (1.6 m)   Wt 271 lb 12.8 oz (123.3 kg)   SpO2 95%   BMI 48.15 kg/m  Well developed and nourished in no acute distress HENT normal Neck supple with JVP-flat Clear Regular rate and rhythm, no murmurs or gallops Abd-soft with active BS No Clubbing cyanosis edema Skin-warm and dry A & Oriented  Grossly normal sensory and motor function    ECG sinus rhythm at 70 Intervals 18/10/40  Assessment and  Plan  Hypertension     Atrial fibrillation-long standing persistent    Cardiomyopathy non ischemic   Congestive heart failure  Class 3  Sarcoid  Obesity    Tremor   Hyperthyroidism  >> now hypothyroidism   Interval echo demonstrated normalization of LV systolic function.  For now encouraged exercise.  Continue diuretics.  We spent more than 50% of our >25 min visit in face to face counseling regarding the above

## 2018-10-10 DIAGNOSIS — R7989 Other specified abnormal findings of blood chemistry: Secondary | ICD-10-CM | POA: Diagnosis not present

## 2018-10-13 ENCOUNTER — Other Ambulatory Visit: Payer: Self-pay | Admitting: Medical

## 2018-10-14 ENCOUNTER — Telehealth: Payer: Self-pay

## 2018-10-14 NOTE — Telephone Encounter (Signed)
Patient with diagnosis of afib on Pradaxa for anticoagulation.    Procedure: colonoscopy Date of procedure:11/03/18  CHADS2-VASc score of 5 (CHF, HTN, AGE, DM2, female)  CrCl 72mL/min Platelet count 172k  Per office protocol, patient can hold Pradaxa for 2 days prior to procedure.

## 2018-10-14 NOTE — Telephone Encounter (Signed)
   Primary Cardiologist: No primary care provider on file.  Primary electrophysiologist: Dr. [name]  Chart reviewed as part of pre-operative protocol coverage. She was recently seen by Dr. [name] 10/06/18 and doing well. She has hx of low EF 25-30% in 2014. Echo done on 09/29/18 shows normal EF 55-60%.  Given past medical history and time since last visit, based on ACC/AHA guidelines, Stephanie [name] would be at acceptable risk for the planned procedure without further cardiovascular testing.   According to our pharmacy protocol:        Patient with diagnosis of afib on Pradaxa for anticoagulation.    Procedure: colonoscopy Date of procedure:11/03/18  CHADS2-VASc score of 5 (CHF, HTN, AGE, DM2, female)  CrCl 65mL/min Platelet count 172k  Per office protocol, patient can hold Pradaxa for 2 days prior to procedure.          I will route this recommendation to the requesting party via Epic fax function and remove from pre-op pool.  Please call with questions.  Daune Perch, NP 10/14/2018, 2:44 PM

## 2018-10-14 NOTE — Telephone Encounter (Signed)
Dr. Havery Moros - patient cleared to hold Pradaxa for only 2 days by Cardiology. Ok to proceed with colonoscopy?

## 2018-10-14 NOTE — Telephone Encounter (Signed)
-----   Message from Yetta Flock, MD sent at 10/14/2018  7:43 AM EST ----- Regarding: RE: Cardiology appt/ECCO Thanks Jan, Chart reviewed. The patient had an Echo and her EF is now NORMAL, which is great news. She can proceed with colonoscopy to be done at the [facility] if you can help schedule. Otherwise, given her GFR in the 30s, guidelines recommend holding Pradaxa 3-4 days. Can you send a message to her prescribing provider to see if that is okay for her colonoscopy? Thanks much. ----- Message ----- From: Roetta Sessions, CMA Sent: 10/13/2018  10:17 AM EST To: Yetta Flock, MD Subject: FW: Cardiology appt/ECCO                       Dr. Havery Moros, FYI:  pt had cardiology appt on 12-19-9 with Dr. [name] and an ECHO on 09-29-18.  Jan    ----- Message ----- From: Roetta Sessions, CMA Sent: 10/13/2018 To: Roetta Sessions, CMA Subject: Cardiology appt/ECCO                           Check to see if pt saw Dr. [name], cardiology on Dec 20th? Did she have an ECHO?  When she has had both - let Dr. Loni Muse know so he can review the chart and decide where and if to schedule her colonoscopy

## 2018-10-14 NOTE — Telephone Encounter (Signed)
Please see Dr. Doyne Keel note below regarding pt holding Pradaxa for at least 3 days prior to colonoscopy scheduled for 11-03-2018. Thank you.

## 2018-10-14 NOTE — Telephone Encounter (Signed)
[facility] Pre-operative Risk Assessment     Request for surgical clearance:     Endoscopy Procedure  What type of surgery is being performed?     Colonoscopy  When is this surgery scheduled?   11-03-18  What type of clearance is required ?   Pharmacy  Are there any medications that need to be held prior to surgery and how long? Pradaxa 3 to 4 days  Practice name and name of physician performing surgery? [name]  What is your office phone and fax number?      Phone- [phone]  Fax- [phone] Contact Stephanie [name], CMA  Anesthesia type (None, local, MAC, general) ?       MAC

## 2018-10-14 NOTE — Telephone Encounter (Signed)
Jan per ASGE guidelines and potential need for polypectomy which is a high risk for bleeding intervention, they recommend at least a 3 day hold on Pradaxa. Can you ask them if that would be possible? Thanks

## 2018-10-14 NOTE — Telephone Encounter (Signed)
Called and spoke to pt.  Scheduled her for colonoscopy with Dr. Havery Moros on 11-03-18 and Previsit on 10-24-18. Sent Anti Coag clearance request for Pradaxa to [facility] (Dr. [name]).

## 2018-10-17 NOTE — Telephone Encounter (Signed)
Dr [name] can you comment on Dr Doyne Keel request to hold Pradaxa 3 days instead of 2 days per our protocol.   Kerin Ransom PA-C 10/17/2018 3:35 PM

## 2018-10-17 NOTE — Telephone Encounter (Signed)
Please advise. Thank you

## 2018-10-21 DIAGNOSIS — E119 Type 2 diabetes mellitus without complications: Secondary | ICD-10-CM | POA: Diagnosis not present

## 2018-10-21 NOTE — Telephone Encounter (Signed)
We need an update for holding Pradaxa for SA requested three days instead of two. Patient to be in [place] on Monday 1/6 130 pm  .Delsa Bern RN PV

## 2018-10-24 ENCOUNTER — Ambulatory Visit (AMBULATORY_SURGERY_CENTER): Payer: Self-pay

## 2018-10-24 VITALS — Ht 63.0 in | Wt 281.6 lb

## 2018-10-24 DIAGNOSIS — Z1211 Encounter for screening for malignant neoplasm of colon: Secondary | ICD-10-CM

## 2018-10-24 MED ORDER — NA SULFATE-K SULFATE-MG SULF 17.5-3.13-1.6 GM/177ML PO SOLN: 1.0000 | Freq: Once | ORAL | 0 refills | Status: AC

## 2018-10-24 NOTE — Progress Notes (Signed)
Per pt, no allergies to soy or egg products.Pt not taking any weight loss meds or using  O2 at home.  Pt refused emmi video. 

## 2018-10-25 DIAGNOSIS — H524 Presbyopia: Secondary | ICD-10-CM | POA: Diagnosis not present

## 2018-10-25 DIAGNOSIS — E113293 Type 2 diabetes mellitus with mild nonproliferative diabetic retinopathy without macular edema, bilateral: Secondary | ICD-10-CM | POA: Diagnosis not present

## 2018-10-25 NOTE — Telephone Encounter (Signed)
Fine to do 3 days

## 2018-10-25 NOTE — Telephone Encounter (Signed)
Called and LM for pt with details:Last day to take Pradaxa is 10-30-18 for 1-16 procedure. Requested pt call back to confirm she got the message.

## 2018-10-25 NOTE — Telephone Encounter (Signed)
Recommendations for anticoagulation addressed by Dr. [name]. Will remove note from preop pool. Richardson Dopp, PA-C    10/25/2018 5:12 PM

## 2018-10-25 NOTE — Telephone Encounter (Signed)
Called and spoke to patient. She expressed understanding to hold Pradaxa starting on 1-13.

## 2018-10-31 ENCOUNTER — Telehealth: Payer: Self-pay | Admitting: Gastroenterology

## 2018-10-31 NOTE — Telephone Encounter (Signed)
Called and spoke to pt.  She does not have fever. We will continue as planned unless her condition worsens or she develops a fever. She will keep [place] posted.

## 2018-10-31 NOTE — Telephone Encounter (Signed)
Pt is scheduled for colon this coming Thursday but still has a cough, she has been sick for the past 10 days, not sure if she will be fine by the day of her proc, wants to know if she should r/s. Pls call her.

## 2018-11-02 ENCOUNTER — Other Ambulatory Visit: Payer: Self-pay | Admitting: Medical

## 2018-11-03 ENCOUNTER — Ambulatory Visit (AMBULATORY_SURGERY_CENTER): Payer: Medicare Other | Admitting: Gastroenterology

## 2018-11-03 ENCOUNTER — Encounter: Payer: Self-pay | Admitting: Gastroenterology

## 2018-11-03 VITALS — BP 137/71 | HR 59 | Temp 98.6°F | Resp 15 | Ht 63.0 in | Wt 271.0 lb

## 2018-11-03 DIAGNOSIS — I4891 Unspecified atrial fibrillation: Secondary | ICD-10-CM | POA: Diagnosis not present

## 2018-11-03 DIAGNOSIS — I10 Essential (primary) hypertension: Secondary | ICD-10-CM | POA: Diagnosis not present

## 2018-11-03 DIAGNOSIS — D869 Sarcoidosis, unspecified: Secondary | ICD-10-CM | POA: Diagnosis not present

## 2018-11-03 DIAGNOSIS — D122 Benign neoplasm of ascending colon: Secondary | ICD-10-CM | POA: Diagnosis not present

## 2018-11-03 DIAGNOSIS — E119 Type 2 diabetes mellitus without complications: Secondary | ICD-10-CM | POA: Diagnosis not present

## 2018-11-03 DIAGNOSIS — Z1211 Encounter for screening for malignant neoplasm of colon: Secondary | ICD-10-CM

## 2018-11-03 MED ORDER — SODIUM CHLORIDE 0.9 % IV SOLN: 500.0000 mL | Freq: Once | INTRAVENOUS | Status: DC

## 2018-11-03 NOTE — Progress Notes (Signed)
Report to PACU, RN, vss, BBS= Clear.  

## 2018-11-03 NOTE — Progress Notes (Signed)
Pt's states no medical or surgical changes since previsit or office visit. 

## 2018-11-03 NOTE — Op Note (Signed)
[facility] Patient Name: Stephanie [name] Procedure Date: 11/03/2018 2:45 PM MRN: [id number] Endoscopist: Remo Lipps P. Havery Moros , MD Age: 68 Referring MD:  Date of Birth: 20-Jul-1951 Gender: Female Account #: [id number] Procedure:                Colonoscopy Indications:              Screening for colorectal malignant neoplasm Medicines:                Monitored Anesthesia Care Procedure:                Pre-Anesthesia Assessment:                           - Prior to the procedure, a History and Physical                            was performed, and patient medications and                            allergies were reviewed. The patient's tolerance of                            previous anesthesia was also reviewed. The risks                            and benefits of the procedure and the sedation                            options and risks were discussed with the patient.                            All questions were answered, and informed consent                            was obtained. Prior Anticoagulants: The patient has                            taken Pradaxa (dabigatran), last dose was 3 days                            prior to procedure. After reviewing the risks and                            benefits, the patient was deemed in satisfactory                            condition to undergo the procedure.                           After obtaining informed consent, the colonoscope                            was passed under direct vision. Throughout the  procedure, the patient's blood pressure, pulse, and                            oxygen saturations were monitored continuously. The                            Colonoscope was introduced through the anus and                            advanced to the the cecum, identified by                            appendiceal orifice and ileocecal valve. The                            colonoscopy was performed  without difficulty. The                            patient tolerated the procedure well. The quality                            of the bowel preparation was good. The ileocecal                            valve, appendiceal orifice, and rectum were                            photographed. Scope In: 3:02:20 PM Scope Out: 3:25:27 PM Scope Withdrawal Time: 0 hours 21 minutes 26 seconds  Total Procedure Duration: 0 hours 23 minutes 7 seconds  Findings:                 The perianal and digital rectal examinations were                            normal.                           Multiple small-mouthed diverticula were found in                            the sigmoid colon.                           A 15-20 mm polyp was found in the ascending colon.                            The polyp was mildly sessile with some flat                            components. Given the patients anticoagulated                            status, location and type of polyp, I elected to  remove with a piecemeal technique using a cold                            snare. Resection and retrieval were complete. Area                            was tattooed with an injection of Spot (carbon                            black).                           The exam was otherwise without abnormality. Complications:            No immediate complications. Estimated blood loss:                            Minimal. Estimated Blood Loss:     Estimated blood loss was minimal. Impression:               - Diverticulosis in the sigmoid colon.                           - One 15-20 mm polyp in the ascending colon,                            removed piecemeal using a cold snare. Resected and                            retrieved. Tattooed.                           - The examination was otherwise normal. Recommendation:           - Patient has a contact number available for                            emergencies. The  signs and symptoms of potential                            delayed complications were discussed with the                            patient. Return to normal activities tomorrow.                            Written discharge instructions were provided to the                            patient.                           - Resume previous diet.                           - Continue present medications.                           -  Resume Pradaxa on 1/18 (2 days)                           - Await pathology results. Remo Lipps P. Armbruster, MD 11/03/2018 3:35:59 PM This report has been signed electronically.

## 2018-11-03 NOTE — Patient Instructions (Signed)
Resume Pradaxa on Jan. 18, 2020  (2 days). Await pathology results. Continue present medications. Please read handouts provided.      YOU HAD AN ENDOSCOPIC PROCEDURE TODAY AT [facility]:   Refer to the procedure report that was given to you for any specific questions about what was found during the examination.  If the procedure report does not answer your questions, please call your gastroenterologist to clarify.  If you requested that your care partner not be given the details of your procedure findings, then the procedure report has been included in a sealed envelope for you to review at your convenience later.  YOU SHOULD EXPECT: Some feelings of bloating in the abdomen. Passage of more gas than usual.  Walking can help get rid of the air that was put into your GI tract during the procedure and reduce the bloating. If you had a lower endoscopy (such as a colonoscopy or flexible sigmoidoscopy) you may notice spotting of blood in your stool or on the toilet paper. If you underwent a bowel prep for your procedure, you may not have a normal bowel movement for a few days.  Please Note:  You might notice some irritation and congestion in your nose or some drainage.  This is from the oxygen used during your procedure.  There is no need for concern and it should clear up in a day or so.  SYMPTOMS TO REPORT IMMEDIATELY:   Following lower endoscopy (colonoscopy or flexible sigmoidoscopy):  Excessive amounts of blood in the stool  Significant tenderness or worsening of abdominal pains  Swelling of the abdomen that is new, acute  Fever of 100F or higher    For urgent or emergent issues, a gastroenterologist can be reached at any hour by calling [phone].   DIET:  We do recommend a small meal at first, but then you may proceed to your regular diet.  Drink plenty of fluids but you should avoid alcoholic beverages for 24 hours.  ACTIVITY:  You should plan to take it easy  for the rest of today and you should NOT DRIVE or use heavy machinery until tomorrow (because of the sedation medicines used during the test).    FOLLOW UP: Our staff will call the number listed on your records the next business day following your procedure to check on you and address any questions or concerns that you may have regarding the information given to you following your procedure. If we do not reach you, we will leave a message.  However, if you are feeling well and you are not experiencing any problems, there is no need to return our call.  We will assume that you have returned to your regular daily activities without incident.  If any biopsies were taken you will be contacted by phone or by letter within the next 1-3 weeks.  Please call us at [phone] if you have not heard about the biopsies in 3 weeks.    SIGNATURES/CONFIDENTIALITY: You and/or your care partner have signed paperwork which will be entered into your electronic medical record.  These signatures attest to the fact that that the information above on your After Visit Summary has been reviewed and is understood.  Full responsibility of the confidentiality of this discharge information lies with you and/or your care-partner.

## 2018-11-03 NOTE — Progress Notes (Signed)
Called to room to assist during endoscopic procedure.  Patient ID and intended procedure confirmed with present staff. Received instructions for my participation in the procedure from the performing physician.  

## 2018-11-04 ENCOUNTER — Telehealth: Payer: Self-pay | Admitting: *Deleted

## 2018-11-04 NOTE — Telephone Encounter (Signed)
Left message on f/u call 

## 2018-11-04 NOTE — Telephone Encounter (Signed)
  Follow up Call-  Call back number 11/03/2018  Post procedure Call Back phone  # [phone]  Permission to leave phone message Yes  Some recent data might be hidden   [facility]

## 2018-11-09 ENCOUNTER — Encounter: Payer: Self-pay | Admitting: Gastroenterology

## 2018-11-18 ENCOUNTER — Other Ambulatory Visit: Payer: Self-pay | Admitting: Medical

## 2018-11-24 ENCOUNTER — Telehealth: Payer: Self-pay | Admitting: Internal Medicine

## 2018-11-24 DIAGNOSIS — Z5181 Encounter for therapeutic drug level monitoring: Secondary | ICD-10-CM

## 2018-11-24 DIAGNOSIS — Z7901 Long term (current) use of anticoagulants: Secondary | ICD-10-CM

## 2018-11-24 DIAGNOSIS — R7989 Other specified abnormal findings of blood chemistry: Secondary | ICD-10-CM

## 2018-11-24 MED ORDER — SACUBITRIL-VALSARTAN 24-26 MG PO TABS: 1.0000 | ORAL_TABLET | Freq: Two times a day (BID) | ORAL | 11 refills | Status: DC

## 2018-11-24 NOTE — Telephone Encounter (Signed)
New message   Pt c/o medication issue:  1. Name of Medication: Entresto  2. How are you currently taking this medication (dosage and times per day)? Twice daily  3. Are you having a reaction (difficulty breathing--STAT)?n/a  4. What is your medication issue? Patient wants to know to continue to take this medication?

## 2018-11-24 NOTE — Addendum Note (Signed)
Addended by: Dollene Primrose on: 11/24/2018 03:54 PM   Modules accepted: Orders

## 2018-11-24 NOTE — Telephone Encounter (Signed)
Pt calling today to request a refill on Entresto. Her [name] order was mistakenly discontinued by her PCP in Dec. Pt states she has been compliant and not skipped any doses. Dr [name] wants pt to remain on Entresto at this time.   A CBC and BMP order and appt were placed for 6 months lab check.  Pt has verbalized understanding of the above and had no additional questions.

## 2018-11-29 ENCOUNTER — Other Ambulatory Visit: Payer: Medicare Other

## 2018-12-02 DIAGNOSIS — E669 Obesity, unspecified: Secondary | ICD-10-CM | POA: Diagnosis not present

## 2018-12-02 DIAGNOSIS — E039 Hypothyroidism, unspecified: Secondary | ICD-10-CM | POA: Diagnosis not present

## 2018-12-02 DIAGNOSIS — E1165 Type 2 diabetes mellitus with hyperglycemia: Secondary | ICD-10-CM | POA: Diagnosis not present

## 2018-12-02 DIAGNOSIS — Z6841 Body Mass Index (BMI) 40.0 and over, adult: Secondary | ICD-10-CM | POA: Diagnosis not present

## 2018-12-02 DIAGNOSIS — I10 Essential (primary) hypertension: Secondary | ICD-10-CM | POA: Diagnosis not present

## 2019-01-19 ENCOUNTER — Other Ambulatory Visit: Payer: Self-pay | Admitting: Medical

## 2019-02-20 ENCOUNTER — Other Ambulatory Visit: Payer: Self-pay | Admitting: Internal Medicine

## 2019-03-06 DIAGNOSIS — I10 Essential (primary) hypertension: Secondary | ICD-10-CM | POA: Diagnosis not present

## 2019-03-06 DIAGNOSIS — E669 Obesity, unspecified: Secondary | ICD-10-CM | POA: Diagnosis not present

## 2019-03-06 DIAGNOSIS — E039 Hypothyroidism, unspecified: Secondary | ICD-10-CM | POA: Diagnosis not present

## 2019-03-06 DIAGNOSIS — E1165 Type 2 diabetes mellitus with hyperglycemia: Secondary | ICD-10-CM | POA: Diagnosis not present

## 2019-03-14 DIAGNOSIS — E1165 Type 2 diabetes mellitus with hyperglycemia: Secondary | ICD-10-CM | POA: Diagnosis not present

## 2019-03-14 DIAGNOSIS — E039 Hypothyroidism, unspecified: Secondary | ICD-10-CM | POA: Diagnosis not present

## 2019-03-15 ENCOUNTER — Other Ambulatory Visit: Payer: Self-pay

## 2019-03-17 ENCOUNTER — Telehealth: Payer: Self-pay

## 2019-03-17 NOTE — Telephone Encounter (Signed)
I called and left patient a message about switching office visit on 04/04/19 to a telehealth visit.

## 2019-03-24 NOTE — Telephone Encounter (Signed)
Patient returned my call, she does not want to do a telehealth visit. She wants an actual office visit. Appointment for 04/04/19 cancelled.

## 2019-03-24 NOTE — Telephone Encounter (Signed)
I called patient, we need consent to bill her insurance for telehealth visit.

## 2019-04-04 ENCOUNTER — Telehealth: Payer: Medicare Other | Admitting: Internal Medicine

## 2019-04-30 NOTE — Progress Notes (Signed)
[name] [address] [place], [place] [address] Phone: [phone] Subjective:   I [name] am serving as a Education administrator for Dr. Hulan Saas.   CC: Knee pain  ZTI:[id number]   07/11/2018 Patient feels like she is doing relatively well.  Encourage patient to continue the home exercise, icing regimen, which activities to do which wants to avoid.  Patient will follow-up with me again in 6 weeks for further evaluation and treatment.  05/01/2019 Stephanie [name] is a 68 y.o. female coming in with complaint of knee pain. Would like bilateral injections today.  Known severe arthritic changes in the knees bilaterally.  Patient has been holding on trying to avoid them secondary to the coronavirus outbreak.  Patient has been trying to increase activity overall low as well and trying to lose weight.  Would like to start working out on a more regular basis but finds it difficult.     Past Medical History:  Diagnosis Date  . Anemia   . Ankle fracture, right    x 2  . Antineoplastic and immunosuppressive drugs causing adverse effect in therapeutic use   . Asthma   . Chronic venous hypertension without complications   . Colon polyps 2009  . Coronary artery disease    no CAD by cath 11.2010  . Depressive disorder, not elsewhere classified   . Diabetes mellitus type 2 in obese (HCC)    hgb AIC 6.1 09/02/2009  . Diastolic CHF, chronic ([place])   . Diverticulosis of colon 2009   colonoscopy  . Gout   . Hepatitis, unspecified    hx of/per pt, never was told of having hepatitis  . Hx of hysterectomy 2002  . Hyperlipidemia   . Hypertension   . Hypothyroidism   . Irritable bowel syndrome   . Lung nodule    87mm RLL nodule on CT Chest  07/2009, resolved by 2011 CT  . Noncompliance   . Obesity   . Persistent atrial fibrillation    s/p DCCV 07/2009; Pradaxa Rx  . PONV (postoperative nausea and vomiting)   . Sarcoidosis    dx by eye exam  . Sleep disturbance  06/2013   sleep study, mild abnormality, no criteria for CPAP   Past Surgical History:  Procedure Laterality Date  . ABDOMINAL HYSTERECTOMY    . CARDIOVERSION     x 2  . CARDIOVERSION N/A 02/27/2013   Procedure: CARDIOVERSION;  Surgeon: Lelon Perla, MD;  Location: [facility] ENDOSCOPY;  Service: Cardiovascular;  Laterality: N/A;  . CARDIOVERSION N/A 03/24/2017   Procedure: CARDIOVERSION;  Surgeon: Josue Hector, MD;  Location: [facility] ENDOSCOPY;  Service: Cardiovascular;  Laterality: N/A;  . CHOLECYSTECTOMY    . COLONOSCOPY     Diverticulosis, colon polyps, repeat 2014, Dr. Deatra Ina  . ESOPHAGOGASTRODUODENOSCOPY N/A 05/16/2014   Procedure: ESOPHAGOGASTRODUODENOSCOPY (EGD);  Surgeon: Wonda Horner, MD;  Location: [facility] ORS;  Service: Gastroenterology;  Laterality: N/A;  . HERNIA REPAIR  2505   umbilical hernia   Social History   Socioeconomic History  . Marital status: Single    Spouse name: Not on file  . Number of children: 5  . Years of education: college  . Highest education level: Not on file  Occupational History  . Occupation: Disabled.  Social Needs  . Financial resource strain: Not on file  . Food insecurity    Worry: Not on file    Inability: Not on file  . Transportation needs    Medical: Not  on file    Non-medical: Not on file  Tobacco Use  . Smoking status: Never Smoker  . Smokeless tobacco: Never Used  Substance and Sexual Activity  . Alcohol use: Yes    Alcohol/week: 0.0 standard drinks    Comment: rare  . Drug use: No  . Sexual activity: Not Currently    Birth control/protection: Surgical  Lifestyle  . Physical activity    Days per week: Not on file    Minutes per session: Not on file  . Stress: Not on file  Relationships  . Social Herbalist on phone: Not on file    Gets together: Not on file    Attends religious service: Not on file    Active member of club or organization: Not on file    Attends meetings of clubs or organizations: Not on file     Relationship status: Not on file  Other Topics Concern  . Not on file  Social History Narrative   Pt lives in [place] with her son.  Unemployed.  Right handed.     Education college   Allergies  Allergen Reactions  . Amiodarone Other (See Comments)    adverse reaction: Tremor/gait disurbance  . Albuterol Palpitations    Palpitations, intolerance  . Atorvastatin Other (See Comments)    Body aches and pains--stiffness.   [name] [Pitavastatin]     Body aches  . Propoxyphene N-Acetaminophen Nausea And Vomiting   Family History  Problem Relation Age of Onset  . Pneumonia Father        deceased  . Hypertension Father   . Cancer Father   . Multiple sclerosis Mother        hx  . Emphysema Other        runs in the family    Current Outpatient Medications (Endocrine & Metabolic):  Marland Kitchen  Insulin Lispro (HUMALOG KWIKPEN) 200 UNIT/ML SOPN, Inject 13 Units into the skin 3 (three) times daily with meals.  Current Outpatient Medications (Cardiovascular):  .  carvedilol (COREG) 25 MG tablet, TAKE 1 TABLET(25 MG) BY MOUTH TWICE DAILY .  furosemide (LASIX) 40 MG tablet, Take 1.5 tablets (60 mg total) by mouth daily. Weight daily and take additional lasix 40 mg for weight gain of 2-3 pounds (Patient taking differently: Take 60 mg by mouth daily. Weight daily and take additional lasix 40 mg for weight gain of 2-3 pounds  Pt takes differently (1 tab (40 mg) daily)) .  sacubitril-valsartan (ENTRESTO) 24-26 MG, Take 1 tablet by mouth 2 (two) times daily.   Current Outpatient Medications (Analgesics):  .  allopurinol (ZYLOPRIM) 300 MG tablet, TAKE 300 MG BY MOUTH DAILY AS NEEDED FOR GOUT .  colchicine 0.6 MG tablet, TAKE 1 TABLET BY MOUTH EVERY DAY AS NEEDED(GOUT)  Current Outpatient Medications (Hematological):  Marland Kitchen  Cyanocobalamin (VITAMIN B-12 PO), Take 2 tablets by mouth daily. Marland Kitchen  PRADAXA 150 MG CAPS capsule, TAKE ONE CAPSULE BY MOUTH TWICE DAILY  Current Outpatient Medications (Other):   Marland Kitchen  ALPRAZolam (XANAX) 1 MG tablet, Take 1 tablet (1 mg total) by mouth at bedtime as needed for anxiety. .  Diclofenac Sodium (PENNSAID) 2 % SOLN, Use on knees topically as needed for pain .  Insulin Pen Needle (NOVOFINE) 30G X 8 MM MISC, For Humalog meal time TID .  hydrOXYzine (ATARAX/VISTARIL) 10 MG tablet, Take 1 tablet (10 mg total) by mouth 3 (three) times daily as needed. .  traZODone (DESYREL) 50 MG tablet, Take 0.5-1  tablets (25-50 mg total) by mouth at bedtime as needed for sleep.    Past medical history, social, surgical and family history all reviewed in electronic medical record.  No pertanent information unless stated regarding to the chief complaint.   Review of Systems:  No headache, visual changes, nausea, vomiting, diarrhea, constipation, dizziness, abdominal pain, skin rash, fevers, chills, night sweats, weight loss, swollen lymph nodes, body aches, joint swelling, muscle aches, chest pain, shortness of breath, mood changes.   Objective  Blood pressure 130/80, pulse 64, height 5\' 3"  (1.6 m), weight 271 lb (122.9 kg), SpO2 98 %.     General: No apparent distress alert and oriented x3 mood and affect normal, dressed appropriately.  Morbidly obese HEENT: Pupils equal, extraocular movements intact  Respiratory: Patient's speak in full sentences and does not appear short of breath  Cardiovascular: No lower extremity edema, non tender, no erythema  Skin: Warm dry intact with no signs of infection or rash on extremities or on axial skeleton.  Abdomen: Soft nontender  Neuro: Cranial nerves II through XII are intact, neurovascularly intact in all extremities with 2+ DTRs and 2+ pulses.  Lymph: No lymphadenopathy of posterior or anterior cervical chain or axillae bilaterally.  Gait antalgic MSK:  Non tender with full range of motion and good stability and symmetric strength and tone of shoulders, elbows, wrist, hip, knee and ankles bilaterally.  Knee: Bilateral valgus deformity  noted. Large thigh to calf ratio.  Tender to palpation over medial and PF joint line.  ROM full in flexion and extension and lower leg rotation. instability with valgus force.  painful patellar compression. Patellar glide with moderate crepitus. Patellar and quadriceps tendons unremarkable. Hamstring and quadriceps strength is normal.  After informed written and verbal consent, patient was seated on exam table. Right knee was prepped with alcohol swab and utilizing anterolateral approach, patient's right knee space was injected with 4:1  marcaine 0.5%: Kenalog 40mg /dL. Patient tolerated the procedure well without immediate complications.  After informed written and verbal consent, patient was seated on exam table. Left knee was prepped with alcohol swab and utilizing anterolateral approach, patient's left knee space was injected with 4:1  marcaine 0.5%: Kenalog 40mg /dL. Patient tolerated the procedure well without immediate complications.     Impression and Recommendations:     This case required medical decision making of moderate complexity. The above documentation has been reviewed and is accurate and complete Lyndal Pulley, DO       Note: This dictation was prepared with Dragon dictation along with smaller phrase technology. Any transcriptional errors that result from this process are unintentional.

## 2019-05-01 ENCOUNTER — Encounter: Payer: Self-pay | Admitting: Family Medicine

## 2019-05-01 ENCOUNTER — Ambulatory Visit (INDEPENDENT_AMBULATORY_CARE_PROVIDER_SITE_OTHER): Payer: Medicare Other | Admitting: Family Medicine

## 2019-05-01 ENCOUNTER — Other Ambulatory Visit: Payer: Self-pay

## 2019-05-01 DIAGNOSIS — M170 Bilateral primary osteoarthritis of knee: Secondary | ICD-10-CM | POA: Diagnosis not present

## 2019-05-01 MED ORDER — HYDROXYZINE HCL 10 MG PO TABS: 10.0000 mg | ORAL_TABLET | Freq: Three times a day (TID) | ORAL | 0 refills | Status: DC | PRN

## 2019-05-01 MED ORDER — TRAZODONE HCL 50 MG PO TABS: 25.0000 mg | ORAL_TABLET | Freq: Every evening | ORAL | 3 refills | Status: DC | PRN

## 2019-05-01 NOTE — Patient Instructions (Addendum)
I am so sorry for your loss.  Ice 20 minutes 2 times daily. Usually after activity and before bed. Voltaren gel 2 times a day  Injected the knees again today  Hydroxizine 3x a day as needed Trazadone at night as needed  If not better see me again in 5-6 weeks we will do gel injections otherwise 12 weeks and can do the steroid injections

## 2019-05-01 NOTE — Assessment & Plan Note (Signed)
Patient has done fairly well.  Discussed icing regimen and home exercises, discussed for weight loss, and discussed the possibility of Visco supplementation.  Patient will not wear custom orthotics or braces.  We will follow-up again in 4 to 8 weeks

## 2019-05-06 ENCOUNTER — Other Ambulatory Visit: Payer: Self-pay | Admitting: Internal Medicine

## 2019-05-10 ENCOUNTER — Encounter: Payer: Self-pay | Admitting: Gastroenterology

## 2019-05-16 ENCOUNTER — Telehealth: Payer: Self-pay | Admitting: Internal Medicine

## 2019-05-16 NOTE — Telephone Encounter (Signed)
New Message ° ° ° °Left message to confirm appt and answer covid questions  °

## 2019-05-17 ENCOUNTER — Encounter: Payer: Self-pay | Admitting: Internal Medicine

## 2019-05-17 ENCOUNTER — Other Ambulatory Visit: Payer: Self-pay

## 2019-05-17 ENCOUNTER — Ambulatory Visit (INDEPENDENT_AMBULATORY_CARE_PROVIDER_SITE_OTHER): Payer: Medicare Other | Admitting: Internal Medicine

## 2019-05-17 VITALS — BP 126/74 | HR 77 | Ht 63.0 in | Wt 292.0 lb

## 2019-05-17 DIAGNOSIS — I428 Other cardiomyopathies: Secondary | ICD-10-CM | POA: Diagnosis not present

## 2019-05-17 DIAGNOSIS — I4891 Unspecified atrial fibrillation: Secondary | ICD-10-CM

## 2019-05-17 DIAGNOSIS — I4819 Other persistent atrial fibrillation: Secondary | ICD-10-CM

## 2019-05-17 DIAGNOSIS — I5022 Chronic systolic (congestive) heart failure: Secondary | ICD-10-CM

## 2019-05-17 MED ORDER — SPIRONOLACTONE 25 MG PO TABS: 25.0000 mg | ORAL_TABLET | Freq: Every day | ORAL | 3 refills | Status: DC

## 2019-05-17 MED ORDER — FUROSEMIDE 40 MG PO TABS: 60.0000 mg | ORAL_TABLET | Freq: Every day | ORAL | 3 refills | Status: DC

## 2019-05-17 NOTE — Patient Instructions (Signed)
Medication Instructions:  Your physician recommends that you continue on your current medications as directed. Please refer to the Current Medication list given to you today.  Labwork: Your physician recommends that you return for lab work in: BMP in 2 weeks  Testing/Procedures: None ordered.  Follow-Up: Your physician recommends that you schedule a follow-up appointment in:   6 months with Dr. [name]  Any Other Special Instructions Will Be Listed Below (If Applicable).     If you need a refill on your cardiac medications before your next appointment, please call your pharmacy.

## 2019-05-17 NOTE — Progress Notes (Signed)
Patient Care Team: Delrae Rend, MD as PCP - General (Endocrinology)   HPI  Stephanie [name] is a 68 y.o. female Seen in followup for atrial fibrillation in the context of morbid obesity, sarcoid and mild left ventricular hypertrophy. Catheterization 2012 demonstrated nonobstructive coronary disease and normal left ventricular function and elevated filling pressures By echocardiogram, recurrent cardiomyopathy was demonstrated with EF 25-30% April 2014   She underwent cardioversion may/14 with the hopes of restoring sinus rhythm and seeing recovery of LV systolic function. She had rapidly reverted to afib and then spontaneously reverted to sinus rhythm. There has been  interval near normalization of LV systolic function confirmed by echo August 2014   She saw Dr. Greggory Brandy 3/15 to consider catheter ablation.  She was not interested. He reduced her amiodarone to 200 mg a day. Encouraged her to refrain from alcohol and to work on blood pressure and weight reduction.  She had a sleep study 2014 with an AHI of 7.5    She developed hyperthyroidism on amiodarone prompting its discontinuation.  She is now being followed by Dr. Buddy Duty and he is working both on her thyroid as well as her diabetes.   DATE TEST EF   3/12 Echo     25 %   8/14 Echo  55-60 %   12/17 Echo  25-30% LAE (47/2.0/45)  5/19 Echo 15-20%   12/19 Echo  55-65%       Date TSH Cr K Hgb  6/17 1.9     1/18 <<0.01     3/18 2.2     6/18 7.4 1.1  12.6  5/19  1.09 4.6 11.1  9/19  1.58 4.9 11.8  11/19  1.42 5.2   5/20 4.02     .   Weight 281(1/20)>>271(5/20)>>292 (7/20)  Had 4 family die of COVID in [place]  No swelling, but increase fluid intake  Not walking   DOE no chest pain, no PND     Past Medical History:  Diagnosis Date  . Anemia   . Ankle fracture, right    x 2  . Antineoplastic and immunosuppressive drugs causing adverse effect in therapeutic use   . Asthma   . Chronic venous hypertension  without complications   . Colon polyps 2009  . Coronary artery disease    no CAD by cath 11.2010  . Depressive disorder, not elsewhere classified   . Diabetes mellitus type 2 in obese (HCC)    hgb AIC 6.1 09/02/2009  . Diastolic CHF, chronic ([place])   . Diverticulosis of colon 2009   colonoscopy  . Gout   . Hepatitis, unspecified    hx of/per pt, never was told of having hepatitis  . Hx of hysterectomy 2002  . Hyperlipidemia   . Hypertension   . Hypothyroidism   . Irritable bowel syndrome   . Lung nodule    30mm RLL nodule on CT Chest  07/2009, resolved by 2011 CT  . Noncompliance   . Obesity   . Persistent atrial fibrillation    s/p DCCV 07/2009; Pradaxa Rx  . PONV (postoperative nausea and vomiting)   . Sarcoidosis    dx by eye exam  . Sleep disturbance 06/2013   sleep study, mild abnormality, no criteria for CPAP    Past Surgical History:  Procedure Laterality Date  . ABDOMINAL HYSTERECTOMY    . CARDIOVERSION     x 2  . CARDIOVERSION N/A 02/27/2013   Procedure: CARDIOVERSION;  Surgeon:  Lelon Perla, MD;  Location: [facility] ENDOSCOPY;  Service: Cardiovascular;  Laterality: N/A;  . CARDIOVERSION N/A 03/24/2017   Procedure: CARDIOVERSION;  Surgeon: Josue Hector, MD;  Location: [facility] ENDOSCOPY;  Service: Cardiovascular;  Laterality: N/A;  . CHOLECYSTECTOMY    . COLONOSCOPY     Diverticulosis, colon polyps, repeat 2014, Dr. Deatra Ina  . ESOPHAGOGASTRODUODENOSCOPY N/A 05/16/2014   Procedure: ESOPHAGOGASTRODUODENOSCOPY (EGD);  Surgeon: Wonda Horner, MD;  Location: [facility] ORS;  Service: Gastroenterology;  Laterality: N/A;  . HERNIA REPAIR  0630   umbilical hernia    Current Outpatient Medications  Medication Sig Dispense Refill  . carvedilol (COREG) 25 MG tablet TAKE 1 TABLET(25 MG) BY MOUTH TWICE DAILY 180 tablet 1  . colchicine 0.6 MG tablet TAKE 1 TABLET BY MOUTH EVERY DAY AS NEEDED(GOUT) 30 tablet 0  . Cyanocobalamin (VITAMIN B-12 PO) Take 2 tablets by mouth daily.    .  Diclofenac Sodium (PENNSAID) 2 % SOLN Use on knees topically as needed for pain    . furosemide (LASIX) 40 MG tablet Take 1.5 tablets (60 mg total) by mouth daily. Weight daily and take additional lasix 40 mg for weight gain of 2-3 pounds 135 tablet 3  . hydrOXYzine (ATARAX/VISTARIL) 10 MG tablet Take 1 tablet (10 mg total) by mouth 3 (three) times daily as needed. 30 tablet 0  . Insulin Lispro (HUMALOG KWIKPEN) 200 UNIT/ML SOPN Inject 13 Units into the skin 3 (three) times daily with meals. 3 mL 2  . Insulin Pen Needle (NOVOFINE) 30G X 8 MM MISC For Humalog meal time TID 100 each 11  . PRADAXA 150 MG CAPS capsule TAKE ONE CAPSULE BY MOUTH TWICE DAILY 60 capsule 5  . sacubitril-valsartan (ENTRESTO) 24-26 MG Take 1 tablet by mouth 2 (two) times daily. 60 tablet 11  . traZODone (DESYREL) 50 MG tablet Take 0.5-1 tablets (25-50 mg total) by mouth at bedtime as needed for sleep. 30 tablet 3   No current facility-administered medications for this visit.     Allergies  Allergen Reactions  . Amiodarone Other (See Comments)    adverse reaction: Tremor/gait disurbance  . Albuterol Palpitations    Palpitations, intolerance  . Atorvastatin Other (See Comments)    Body aches and pains--stiffness.   [name] [Pitavastatin]     Body aches  . Propoxyphene N-Acetaminophen Nausea And Vomiting    Review of Systems negative except from HPI and PMH  Physical Exam   BP 126/74   Pulse 77   Ht 5\' 3"  (1.6 m)   Wt 292 lb (132.5 kg)   SpO2 98%   BMI 51.73 kg/m  Well developed and Morbidly obese in no acute distress HENT normal Neck supple with JVP-flat Clear Regular rate and rhythm, no  murmur Abd-soft with active BS No Clubbing cyanosis *  edema Skin-warm and dry A & Oriented  Grossly normal sensory and motor function  ECG sinus @ 77  17/11/42 Nonsustained atrial tach    Assessment and  Plan  Hypertension     Atrial fibrillation-long standing persistent    Cardiomyopathy non ischemic  resolved intercurrently  Congestive heart failure  Class 3  Sarcoid  Obesity - morbid   Tremor   Euthyroid    Has not been on diuretics, will resume  Encouraged to walk-- needs weight loss-- paranoid after losing family members to COVID   No interval atrial fib of which she is aware  On Anticoagulation;  No bleeding issues   We spent more than 50%  of our >25 min visit in face to face counseling regarding the above

## 2019-05-31 ENCOUNTER — Other Ambulatory Visit: Payer: Self-pay

## 2019-05-31 ENCOUNTER — Other Ambulatory Visit: Payer: Medicare Other | Admitting: *Deleted

## 2019-05-31 ENCOUNTER — Encounter (INDEPENDENT_AMBULATORY_CARE_PROVIDER_SITE_OTHER): Payer: Self-pay

## 2019-05-31 DIAGNOSIS — I4891 Unspecified atrial fibrillation: Secondary | ICD-10-CM

## 2019-05-31 DIAGNOSIS — I428 Other cardiomyopathies: Secondary | ICD-10-CM

## 2019-05-31 DIAGNOSIS — I5022 Chronic systolic (congestive) heart failure: Secondary | ICD-10-CM

## 2019-05-31 DIAGNOSIS — I4819 Other persistent atrial fibrillation: Secondary | ICD-10-CM | POA: Diagnosis not present

## 2019-05-31 LAB — BASIC METABOLIC PANEL
BUN/Creatinine Ratio: 23 (ref 12–28)
BUN: 29 mg/dL — ABNORMAL HIGH (ref 8–27)
CO2: 25 mmol/L (ref 20–29)
Calcium: 9.6 mg/dL (ref 8.7–10.3)
Chloride: 95 mmol/L — ABNORMAL LOW (ref 96–106)
Creatinine, Ser: 1.24 mg/dL — ABNORMAL HIGH (ref 0.57–1.00)
GFR calc Af Amer: 52 mL/min/{1.73_m2} — ABNORMAL LOW (ref >59)
GFR calc non Af Amer: 45 mL/min/{1.73_m2} — ABNORMAL LOW (ref >59)
Glucose: 217 mg/dL — ABNORMAL HIGH (ref 65–99)
Potassium: 5.2 mmol/L (ref 3.5–5.2)
Sodium: 134 mmol/L (ref 134–144)

## 2019-06-05 DIAGNOSIS — E669 Obesity, unspecified: Secondary | ICD-10-CM | POA: Diagnosis not present

## 2019-06-05 DIAGNOSIS — I10 Essential (primary) hypertension: Secondary | ICD-10-CM | POA: Diagnosis not present

## 2019-06-05 DIAGNOSIS — E039 Hypothyroidism, unspecified: Secondary | ICD-10-CM | POA: Diagnosis not present

## 2019-06-05 DIAGNOSIS — E1165 Type 2 diabetes mellitus with hyperglycemia: Secondary | ICD-10-CM | POA: Diagnosis not present

## 2019-06-13 ENCOUNTER — Encounter: Payer: Self-pay | Admitting: Family Medicine

## 2019-06-13 ENCOUNTER — Other Ambulatory Visit: Payer: Self-pay

## 2019-06-13 ENCOUNTER — Ambulatory Visit (INDEPENDENT_AMBULATORY_CARE_PROVIDER_SITE_OTHER): Payer: Medicare Other | Admitting: Family Medicine

## 2019-06-13 DIAGNOSIS — M170 Bilateral primary osteoarthritis of knee: Secondary | ICD-10-CM

## 2019-06-13 DIAGNOSIS — E1165 Type 2 diabetes mellitus with hyperglycemia: Secondary | ICD-10-CM | POA: Diagnosis not present

## 2019-06-13 DIAGNOSIS — E039 Hypothyroidism, unspecified: Secondary | ICD-10-CM | POA: Diagnosis not present

## 2019-06-13 NOTE — Patient Instructions (Addendum)
Good to see you  Ice 20 minutes 2 times daily. Usually after activity and before bed. Exercises 3 times a week.  Wear good shoes  See [place] when you need [place] [phone]

## 2019-06-13 NOTE — Assessment & Plan Note (Signed)
Patient did very well after the injections and do not want to do the viscosupplementation at this point.  We discussed with patient to continue topical anti-inflammatories, encouraged weight loss, continue with the icing regimen.  Follow-up again in 3 months

## 2019-06-13 NOTE — Progress Notes (Signed)
[name] [place],  [address] Phone: [phone] Subjective:   I Stephanie [name] am serving as a Education administrator for Dr. Hulan Saas.  I'm seeing this patient by the request  of:    CC: Bilateral knee pain follow-up  [id number]   05/01/2019 Patient has done fairly well.  Discussed icing regimen and home exercises, discussed for weight loss, and discussed the possibility of Visco supplementation.  Patient will not wear custom orthotics or braces.  We will follow-up again in 4 to 8 weeks  06/13/2019 Stephanie [name] is a 68 y.o. female coming in with complaint of knee pain. Patient doing better.  Patient is doing very well at this time.  Patient states that she is about 90% better still.  Feels like injections have been doing better.  Patient is starting to workout on a more regular basis at the moment.     Past Medical History:  Diagnosis Date  . Anemia   . Ankle fracture, right    x 2  . Antineoplastic and immunosuppressive drugs causing adverse effect in therapeutic use   . Asthma   . Chronic venous hypertension without complications   . Colon polyps 2009  . Coronary artery disease    no CAD by cath 11.2010  . Depressive disorder, not elsewhere classified   . Diabetes mellitus type 2 in obese (HCC)    hgb AIC 6.1 09/02/2009  . Diastolic CHF, chronic ([place])   . Diverticulosis of colon 2009   colonoscopy  . Gout   . Hepatitis, unspecified    hx of/per pt, never was told of having hepatitis  . Hx of hysterectomy 2002  . Hyperlipidemia   . Hypertension   . Hypothyroidism   . Irritable bowel syndrome   . Lung nodule    32mm RLL nodule on CT Chest  07/2009, resolved by 2011 CT  . Noncompliance   . Obesity   . Persistent atrial fibrillation    s/p DCCV 07/2009; Pradaxa Rx  . PONV (postoperative nausea and vomiting)   . Sarcoidosis    dx by eye exam  . Sleep disturbance 06/2013   sleep study, mild abnormality, no criteria for  CPAP   Past Surgical History:  Procedure Laterality Date  . ABDOMINAL HYSTERECTOMY    . CARDIOVERSION     x 2  . CARDIOVERSION N/A 02/27/2013   Procedure: CARDIOVERSION;  Surgeon: Lelon Perla, MD;  Location: [facility] ENDOSCOPY;  Service: Cardiovascular;  Laterality: N/A;  . CARDIOVERSION N/A 03/24/2017   Procedure: CARDIOVERSION;  Surgeon: Josue Hector, MD;  Location: [facility] ENDOSCOPY;  Service: Cardiovascular;  Laterality: N/A;  . CHOLECYSTECTOMY    . COLONOSCOPY     Diverticulosis, colon polyps, repeat 2014, Dr. Deatra Ina  . ESOPHAGOGASTRODUODENOSCOPY N/A 05/16/2014   Procedure: ESOPHAGOGASTRODUODENOSCOPY (EGD);  Surgeon: Wonda Horner, MD;  Location: [facility] ORS;  Service: Gastroenterology;  Laterality: N/A;  . HERNIA REPAIR  [id number]   umbilical hernia   Social History   Socioeconomic History  . Marital status: Single    Spouse name: Not on file  . Number of children: 5  . Years of education: college  . Highest education level: Not on file  Occupational History  . Occupation: Disabled.  Social Needs  . Financial resource strain: Not on file  . Food insecurity    Worry: Not on file    Inability: Not on file  . Transportation needs    Medical: Not on file  Non-medical: Not on file  Tobacco Use  . Smoking status: Never Smoker  . Smokeless tobacco: Never Used  Substance and Sexual Activity  . Alcohol use: Yes    Alcohol/week: 0.0 standard drinks    Comment: rare  . Drug use: No  . Sexual activity: Not Currently    Birth control/protection: Surgical  Lifestyle  . Physical activity    Days per week: Not on file    Minutes per session: Not on file  . Stress: Not on file  Relationships  . Social Herbalist on phone: Not on file    Gets together: Not on file    Attends religious service: Not on file    Active member of club or organization: Not on file    Attends meetings of clubs or organizations: Not on file    Relationship status: Not on file  Other Topics Concern   . Not on file  Social History Narrative   Pt lives in [place] with her son.  Unemployed.  Right handed.     Education college   Allergies  Allergen Reactions  . Amiodarone Other (See Comments)    adverse reaction: Tremor/gait disurbance  . Albuterol Palpitations    Palpitations, intolerance  . Atorvastatin Other (See Comments)    Body aches and pains--stiffness.   [name] [Pitavastatin]     Body aches  . Propoxyphene N-Acetaminophen Nausea And Vomiting   Family History  Problem Relation Age of Onset  . Pneumonia Father        deceased  . Hypertension Father   . Cancer Father   . Multiple sclerosis Mother        hx  . Emphysema Other        runs in the family    Current Outpatient Medications (Endocrine & Metabolic):  Marland Kitchen  Insulin Lispro (HUMALOG KWIKPEN) 200 UNIT/ML SOPN, Inject 13 Units into the skin 3 (three) times daily with meals.  Current Outpatient Medications (Cardiovascular):  .  carvedilol (COREG) 25 MG tablet, TAKE 1 TABLET(25 MG) BY MOUTH TWICE DAILY .  furosemide (LASIX) 40 MG tablet, Take 1.5 tablets (60 mg total) by mouth daily. Weight daily and take additional lasix 40 mg for weight gain of 2-3 pounds .  sacubitril-valsartan (ENTRESTO) 24-26 MG, Take 1 tablet by mouth 2 (two) times daily. Marland Kitchen  spironolactone (ALDACTONE) 25 MG tablet, Take 1 tablet (25 mg total) by mouth daily.   Current Outpatient Medications (Analgesics):  .  colchicine 0.6 MG tablet, TAKE 1 TABLET BY MOUTH EVERY DAY AS NEEDED(GOUT)  Current Outpatient Medications (Hematological):  Marland Kitchen  Cyanocobalamin (VITAMIN B-12 PO), Take 2 tablets by mouth daily. Marland Kitchen  PRADAXA 150 MG CAPS capsule, TAKE ONE CAPSULE BY MOUTH TWICE DAILY  Current Outpatient Medications (Other):  Marland Kitchen  Diclofenac Sodium (PENNSAID) 2 % SOLN, Use on knees topically as needed for pain .  hydrOXYzine (ATARAX/VISTARIL) 10 MG tablet, Take 1 tablet (10 mg total) by mouth 3 (three) times daily as needed. .  Insulin Pen Needle  (NOVOFINE) 30G X 8 MM MISC, For Humalog meal time TID .  traZODone (DESYREL) 50 MG tablet, Take 0.5-1 tablets (25-50 mg total) by mouth at bedtime as needed for sleep.    Past medical history, social, surgical and family history all reviewed in electronic medical record.  No pertanent information unless stated regarding to the chief complaint.   Review of Systems:  No headache, visual changes, nausea, vomiting, diarrhea, constipation, dizziness, abdominal pain, skin rash,  fevers, chills, night sweats, weight loss, swollen lymph nodes, body aches, joint swelling, chest pain, shortness of breath, mood changes.  Positive muscle aches  Objective  Blood pressure 138/82, pulse 83, height 5\' 3"  (1.6 m), weight 289 lb (131.1 kg), SpO2 98 %.    General: No apparent distress alert and oriented x3 mood and affect normal, dressed appropriately.  HEENT: Pupils equal, extraocular movements intact  Respiratory: Patient's speak in full sentences and does not appear short of breath  Cardiovascular: No lower extremity edema, non tender, no erythema  Skin: Warm dry intact with no signs of infection or rash on extremities or on axial skeleton.  Abdomen: Soft nontender  Neuro: Cranial nerves II through XII are intact, neurovascularly intact in all extremities with 2+ DTRs and 2+ pulses.  Lymph: No lymphadenopathy of posterior or anterior cervical chain or axillae bilaterally.  Gait normal with good balance and coordination.  MSK: Patient is doing well.  Knee exam does show the degenerative changes noted with some valgus force does have some instability.  Full range of motion is noted.  Full strength in the hamstring and quadricep bilaterally.  Mild positive patellar grind..     Impression and Recommendations:     This case required medical decision making of moderate complexity. The above documentation has been reviewed and is accurate and complete Lyndal Pulley, DO       Note: This dictation was  prepared with Dragon dictation along with smaller phrase technology. Any transcriptional errors that result from this process are unintentional.

## 2019-06-22 ENCOUNTER — Ambulatory Visit (INDEPENDENT_AMBULATORY_CARE_PROVIDER_SITE_OTHER): Payer: Medicare Other | Admitting: Gastroenterology

## 2019-06-22 ENCOUNTER — Encounter: Payer: Self-pay | Admitting: Gastroenterology

## 2019-06-22 ENCOUNTER — Telehealth: Payer: Self-pay

## 2019-06-22 ENCOUNTER — Other Ambulatory Visit: Payer: Self-pay

## 2019-06-22 VITALS — BP 140/80 | HR 68 | Temp 98.6°F | Ht 62.5 in | Wt 293.1 lb

## 2019-06-22 DIAGNOSIS — Z860100 Personal history of colon polyps, unspecified: Secondary | ICD-10-CM

## 2019-06-22 DIAGNOSIS — R110 Nausea: Secondary | ICD-10-CM

## 2019-06-22 DIAGNOSIS — Z7901 Long term (current) use of anticoagulants: Secondary | ICD-10-CM

## 2019-06-22 DIAGNOSIS — K5900 Constipation, unspecified: Secondary | ICD-10-CM

## 2019-06-22 MED ORDER — POLYETHYLENE GLYCOL 3350 17 G PO PACK: 17.0000 g | PACK | Freq: Every day | ORAL | 0 refills | Status: DC | PRN

## 2019-06-22 MED ORDER — SUPREP BOWEL PREP KIT 17.5-3.13-1.6 GM/177ML PO SOLN: ORAL | 0 refills | Status: DC

## 2019-06-22 MED ORDER — ONDANSETRON 4 MG PO TBDP: 4.0000 mg | ORAL_TABLET | Freq: Four times a day (QID) | ORAL | 1 refills | Status: DC | PRN

## 2019-06-22 NOTE — Telephone Encounter (Signed)
[facility] Pre-operative Risk Assessment     Request for surgical clearance:     Endoscopy Procedure  What type of surgery is being performed?     COLONOSCOPY  When is this surgery scheduled?     07-05-2019  What type of clearance is required ?   Pharmacy  Are there any medications that need to be held prior to surgery and how long? PRADAXA 3 DAYS  Practice name and name of physician performing surgery?  [facility]  What is your office phone and fax number?      Phone- [phone]  Fax- [place], CMA  Anesthesia type (None, local, MAC, general) ?       MAC  THANK YOU

## 2019-06-22 NOTE — Telephone Encounter (Signed)
Please advise 

## 2019-06-22 NOTE — Progress Notes (Signed)
HPI :  68 year old female here for follow-up visit.  She has a history of CHF / AF and is on long-term anticoagulation with Pradaxa.  She also has a history of colon polyps.  Her last colonoscopy was performed this past January 2020.  She had a 20 mm a sending colon polyp removed in piecemeal.  This returned as a adenoma.  She tolerated the exam well.  No bleeding.  She has had some occasional constipation but not taking anything for that.  She also has some nausea around the time of when she takes some of her medications, she thinks it is related.  She denies any vomiting.  No abdominal pains.  She denies any blood in her stools.  In mid 2019 her ejection fraction was severely reduced to 15%.  Fortunately with adequate management of her heart failure her EF is most recently between 53 and 60% as of December 2019.  Denies any shortness of breath or chest pain.  She has gained weight since the coronavirus epidemic started.  Her GFR is 52.  Echocardiogram 09/29/2018 -EF 55-60%  Endoscopic history: EGD 05/16/14 - normal without pathology to explain symptoms of possible food impaction Colonoscopy 12/26/07 - 52mm benign descending colon polyp - benign no adenoma, diverticulosis Colonoscopy 11/03/2018 - Multiple small-mouthed diverticula were found in the sigmoid colon. A 15-20 mm polyp was found in the ascending colon, remove piecemeal cold snare - path c/w adenoma.    Past Medical History:  Diagnosis Date  . Anemia   . Ankle fracture, right    x 2  . Antineoplastic and immunosuppressive drugs causing adverse effect in therapeutic use   . Asthma   . Chronic venous hypertension without complications   . Colon polyps 2009  . Coronary artery disease    no CAD by cath 11.2010  . Depressive disorder, not elsewhere classified   . Diabetes mellitus type 2 in obese (HCC)    hgb AIC 6.1 09/02/2009  . Diastolic CHF, chronic ([place])   . Diverticulosis of colon 2009   colonoscopy  . Gout   . Hepatitis,  unspecified    hx of/per pt, never was told of having hepatitis  . Hx of hysterectomy 2002  . Hyperlipidemia   . Hypertension   . Hypothyroidism   . Irritable bowel syndrome   . Lung nodule    6mm RLL nodule on CT Chest  07/2009, resolved by 2011 CT  . Noncompliance   . Obesity   . Persistent atrial fibrillation    s/p DCCV 07/2009; Pradaxa Rx  . PONV (postoperative nausea and vomiting)   . Sarcoidosis    dx by eye exam  . Sleep disturbance 06/2013   sleep study, mild abnormality, no criteria for CPAP     Past Surgical History:  Procedure Laterality Date  . ABDOMINAL HYSTERECTOMY    . CARDIOVERSION     x 2  . CARDIOVERSION N/A 02/27/2013   Procedure: CARDIOVERSION;  Surgeon: Lelon Perla, MD;  Location: [facility] ENDOSCOPY;  Service: Cardiovascular;  Laterality: N/A;  . CARDIOVERSION N/A 03/24/2017   Procedure: CARDIOVERSION;  Surgeon: Josue Hector, MD;  Location: [facility] ENDOSCOPY;  Service: Cardiovascular;  Laterality: N/A;  . CHOLECYSTECTOMY    . COLONOSCOPY     Diverticulosis, colon polyps, repeat 2014, Dr. Deatra Ina  . ESOPHAGOGASTRODUODENOSCOPY N/A 05/16/2014   Procedure: ESOPHAGOGASTRODUODENOSCOPY (EGD);  Surgeon: Wonda Horner, MD;  Location: [facility] ORS;  Service: Gastroenterology;  Laterality: N/A;  . HERNIA REPAIR  2009  umbilical hernia   Family History  Problem Relation Age of Onset  . Pneumonia Father        deceased  . Hypertension Father   . Cancer Father   . Multiple sclerosis Mother        hx  . Emphysema Other        runs in the family   Social History   Tobacco Use  . Smoking status: Never Smoker  . Smokeless tobacco: Never Used  Substance Use Topics  . Alcohol use: Yes    Alcohol/week: 0.0 standard drinks    Comment: rare  . Drug use: No   Current Outpatient Medications  Medication Sig Dispense Refill  . carvedilol (COREG) 25 MG tablet TAKE 1 TABLET(25 MG) BY MOUTH TWICE DAILY 180 tablet 1  . colchicine 0.6 MG tablet TAKE 1 TABLET BY MOUTH EVERY DAY  AS NEEDED(GOUT) 30 tablet 0  . Cyanocobalamin (VITAMIN B-12 PO) Take 2 tablets by mouth daily.    . Diclofenac Sodium (PENNSAID) 2 % SOLN Use on knees topically as needed for pain    . furosemide (LASIX) 40 MG tablet Take 1.5 tablets (60 mg total) by mouth daily. Weight daily and take additional lasix 40 mg for weight gain of 2-3 pounds 135 tablet 3  . hydrOXYzine (ATARAX/VISTARIL) 10 MG tablet Take 1 tablet (10 mg total) by mouth 3 (three) times daily as needed. 30 tablet 0  . Insulin Pen Needle (NOVOFINE) 30G X 8 MM MISC For Humalog meal time TID 100 each 11  . metFORMIN (GLUCOPHAGE) 500 MG tablet Take 1 tablet by mouth 2 (two) times daily.    Marland Kitchen NOVOLOG FLEXPEN 100 UNIT/ML FlexPen Inject 13 Units into the skin 3 (three) times daily with meals.     Marland Kitchen PRADAXA 150 MG CAPS capsule TAKE ONE CAPSULE BY MOUTH TWICE DAILY 60 capsule 5  . sacubitril-valsartan (ENTRESTO) 24-26 MG Take 1 tablet by mouth 2 (two) times daily. 60 tablet 11  . spironolactone (ALDACTONE) 25 MG tablet Take 1 tablet (25 mg total) by mouth daily. 90 tablet 3  . traZODone (DESYREL) 50 MG tablet Take 0.5-1 tablets (25-50 mg total) by mouth at bedtime as needed for sleep. 30 tablet 3   No current facility-administered medications for this visit.    Allergies  Allergen Reactions  . Amiodarone Other (See Comments)    adverse reaction: Tremor/gait disurbance  . Albuterol Palpitations    Palpitations, intolerance  . Atorvastatin Other (See Comments)    Body aches and pains--stiffness.   [name] [Pitavastatin]     Body aches  . Propoxyphene N-Acetaminophen Nausea And Vomiting     Review of Systems: All systems reviewed and negative except where noted in HPI.   Lab Results  Component Value Date   WBC 6.9 07/08/2018   HGB 11.9 (L) 07/08/2018   HCT 36.9 07/08/2018   MCV 96.3 07/08/2018   PLT 172 07/08/2018    Lab Results  Component Value Date   CREATININE 1.24 (H) 05/31/2019   BUN 29 (H) 05/31/2019   NA 134  05/31/2019   K 5.2 05/31/2019   CL 95 (L) 05/31/2019   CO2 25 05/31/2019    Lab Results  Component Value Date   ALT 15 03/01/2018   AST 13 03/01/2018   ALKPHOS 58 03/01/2018   BILITOT 0.4 03/01/2018     Physical Exam: BP 140/80 (BP Location: Left Arm, Patient Position: Sitting, Cuff Size: Large)   Pulse 68 Comment: irregular  Temp 98.6  F (37 C)   Ht 5' 2.5" (1.588 m) Comment: height measured without shoes  Wt 293 lb 2 oz (133 kg)   BMI 52.76 kg/m  Constitutional: Pleasant,well-developed, female in no acute distress. HEENT: Normocephalic and atraumatic. Conjunctivae are normal. No scleral icterus. Neck supple.  Cardiovascular: Normal rate, regular rhythm.  Pulmonary/chest: Effort normal and breath sounds normal. No wheezing, rales or rhonchi. Abdominal: Soft, nondistended, protuberant. There are no masses palpable.  Extremities: no edema Lymphadenopathy: No cervical adenopathy noted. Neurological: Alert and oriented to person place and time. Skin: Skin is warm and dry. No rashes noted. Psychiatric: Normal mood and affect. Behavior is normal.   ASSESSMENT AND PLAN: 68 year old female here for reassessment the following issues:  History of colon polyps / Anticoagulated - patient with colonoscopy in January at which time a large right-sided colonoscopy was removed in piecemeal fashion, this returned as an adenoma.  Given the manner in which it was removed I am recommending a follow-up colonoscopy to ensure no residual polypoid tissue at this site.  If it looks good then we will extend her surveillance for 3 years.  We will ask her cardiologist if she can hold her Pradaxa for 3 days prior to the procedure given her GFR, she can use aspirin during the time of holding Pradaxa.  After discussion risks and benefits she want to proceed.  Further recommendations pending the results.  Constipation - recommend MiraLAX daily and titrate up as needed to treat this.  She agreed  Nausea -  sounds like medication induced nausea, will give her some Zofran to use as needed for this and see if it helps.   [place], MD [facility]

## 2019-06-22 NOTE — Telephone Encounter (Signed)
Patient with diagnosis of afib on pradaxa for anticoagulation.    Procedure: COLONOSCOPY Date of procedure: 07/05/2019  CHADS2-VASc score of  6 (CHF, HTN, AGE, DM2, CAD, female)  CrCl 57.8 ml/min (adjusted BW) 16ml/min (actual BW)  Patient can hold Pradaxa for 2-3 days prior to procedure.

## 2019-06-22 NOTE — Patient Instructions (Addendum)
If you are age 67 or older, your body mass index should be between 23-30. Your Body mass index is 52.76 kg/m. If this is out of the aforementioned range listed, please consider follow up with your Primary Care Provider.  If you are age 71 or younger, your body mass index should be between 19-25. Your Body mass index is 52.76 kg/m. If this is out of the aformentioned range listed, please consider follow up with your Primary Care Provider.   To help prevent the possible spread of infection to our patients, communities, and staff; we will be implementing the following measures:  As of now we are not allowing any visitors/family members to accompany you to any upcoming appointments with [facility]. If you have any concerns about this please contact our office to discuss prior to the appointment.    You have been scheduled for a colonoscopy. Please follow written instructions given to you at your visit today.  Please pick up your prep supplies at the pharmacy within the next 1-3 days. If you use inhalers (even only as needed), please bring them with you on the day of your procedure. Your physician has requested that you go to www.startemmi.com and enter the access code given to you at your visit today. This web site gives a general overview about your procedure. However, you should still follow specific instructions given to you by our office regarding your preparation for the procedure.  You will be contacted by our office prior to your procedure for directions on holding your Pradaxa.  If you do not hear from our office 1 week prior to your scheduled procedure, please call [phone] to discuss.    We have sent the following medications to your pharmacy for you to pick up at your convenience: Zofran 4mg  ODT: Take every 6 hours as needed  Use Miralax as directed as needed.  Thank you for entrusting me with your care and for choosing [facility], Dr. [facility] Cellar    .

## 2019-06-22 NOTE — Telephone Encounter (Signed)
Called and left detailed message for pt that she has been cleared to hold Pradaxa for 3 days prior to colon on 9-16.   Asked her to call back to verify she rec'd my call and to answer any questions she may have.

## 2019-06-27 NOTE — Telephone Encounter (Signed)
LM for pt to call back to verify she received message to hold Pradaxa starting on 07-02-19 for 9-16 procedure

## 2019-06-27 NOTE — Telephone Encounter (Signed)
Called patient on her mobile number and she stated that she received the message about holding her Pradaxa medication for 3 days prior to procedure stating on 07/02/19. She verbalized an understanding and all (if any) questions were answered.

## 2019-07-04 ENCOUNTER — Telehealth: Payer: Self-pay

## 2019-07-04 NOTE — Telephone Encounter (Signed)
Covid-19 screening questions   Do you now or have you had a fever in the last 14 days?  Do you have any respiratory symptoms of shortness of breath or cough now or in the last 14 days?  Do you have any family members or close contacts with diagnosed or suspected Covid-19 in the past 14 days?  Have you been tested for Covid-19 and found to be positive?       

## 2019-07-05 ENCOUNTER — Ambulatory Visit: Payer: Medicare Other | Admitting: Gastroenterology

## 2019-07-05 ENCOUNTER — Other Ambulatory Visit: Payer: Self-pay

## 2019-07-05 VITALS — Ht 62.5 in | Wt 293.0 lb

## 2019-07-05 NOTE — Progress Notes (Signed)
Patient was scheduled for colonoscopy today for polyp surveillance. Unfortunately we did not see that her BMI was > 50 in the clinic visit, and unfortunately we cannot give her anesthesia today for a colonoscopy at the [facility]. I spoke with the patient about options. We re-weighed her and her weight was 293 lbs. BMI remains > 50. We called to [facility] and [facility] endo units, they did not have capacity to do her exam today, and she needs negative COVID testing to have the exam done at the hospital. We briefly discussed if she wanted to try the exam unsedated without anesthesia at the [facility] today, and she did not want to try that.  Unfortunately we will have to reschedule her to be done at the hospital given BMI > 50. I can accomodate her on October 6th. Free sample of PLenvu given to her for bowel prep. Our office will call to coordinate details. She agreed.

## 2019-07-06 ENCOUNTER — Other Ambulatory Visit: Payer: Self-pay

## 2019-07-06 DIAGNOSIS — Z86010 Personal history of colonic polyps: Secondary | ICD-10-CM

## 2019-07-11 ENCOUNTER — Other Ambulatory Visit: Payer: Self-pay

## 2019-07-11 ENCOUNTER — Telehealth: Payer: Self-pay

## 2019-07-11 MED ORDER — SUPREP BOWEL PREP KIT 17.5-3.13-1.6 GM/177ML PO SOLN: ORAL | 0 refills | Status: DC

## 2019-07-11 NOTE — Telephone Encounter (Signed)
Patient scheduled for Colonoscopy at [facility] on 07/25/19 patient to arrive at 7:00am. COVID testing scheduled for 07/21/19 between 9am-noon at [place]. Asked patient to go through the yellow lane and tell tester she was there because she was having a hospital procedure. And she knows to quarantine between testing and her procedure. Gave instructions over the phone  And sent letter to: Hold Pardaxa for 3 days before. And instructions on taking her Insulin and Metformin. Patient wanted to do the Suprep instead of the Plenvu, sent it to her pharmacy.

## 2019-07-19 DIAGNOSIS — Z23 Encounter for immunization: Secondary | ICD-10-CM | POA: Diagnosis not present

## 2019-07-20 ENCOUNTER — Other Ambulatory Visit: Payer: Self-pay

## 2019-07-20 ENCOUNTER — Encounter (HOSPITAL_COMMUNITY): Payer: Self-pay | Admitting: Emergency Medicine

## 2019-07-20 NOTE — Progress Notes (Signed)
Pre-op endo call completed.  Patient reports she has been instructed to hold pradaxa x3 days .

## 2019-07-20 NOTE — Progress Notes (Signed)
Pre-op endo call attempted. No answer. lmtcb 

## 2019-07-21 ENCOUNTER — Other Ambulatory Visit (HOSPITAL_COMMUNITY)
Admission: RE | Admit: 2019-07-21 | Discharge: 2019-07-21 | Disposition: A | Discharge status: Home / Self Care | Payer: Medicare Other | Source: Ambulatory Visit | Attending: Gastroenterology | Admitting: Gastroenterology

## 2019-07-21 DIAGNOSIS — K635 Polyp of colon: Secondary | ICD-10-CM | POA: Insufficient documentation

## 2019-07-21 DIAGNOSIS — K5790 Diverticulosis of intestine, part unspecified, without perforation or abscess without bleeding: Secondary | ICD-10-CM | POA: Diagnosis not present

## 2019-07-21 DIAGNOSIS — Z01812 Encounter for preprocedural laboratory examination: Secondary | ICD-10-CM | POA: Insufficient documentation

## 2019-07-21 DIAGNOSIS — Z20828 Contact with and (suspected) exposure to other viral communicable diseases: Secondary | ICD-10-CM | POA: Insufficient documentation

## 2019-07-23 LAB — NOVEL CORONAVIRUS, NAA (HOSP ORDER, SEND-OUT TO REF LAB; TAT 18-24 HRS): SARS-CoV-2, NAA: NOT DETECTED

## 2019-07-24 ENCOUNTER — Telehealth: Payer: Self-pay | Admitting: Gastroenterology

## 2019-07-24 NOTE — Progress Notes (Signed)
Called and talked with patient to confirm appointment. Pt stated she had been able to quarantine since her covid test and has not had any fever or flu like symptoms. She had no questions and will arrive as planned

## 2019-07-24 NOTE — Telephone Encounter (Signed)
Called patient back. She had question about the start time of her suprep the morning of her Colonoscopy. I had put on the letter I sent her to do the 2nd dose at 6:00am ( the standard time) and did not change it to 5 hrs. before. With her having an early morning procedure ( to be at the hospital at 7:00am) she needed to take the 2nd dose at 2:00am. She will take it at that time

## 2019-07-25 ENCOUNTER — Ambulatory Visit (HOSPITAL_COMMUNITY): Payer: Medicare Other | Admitting: Registered Nurse

## 2019-07-25 ENCOUNTER — Encounter (HOSPITAL_COMMUNITY): Payer: Self-pay

## 2019-07-25 ENCOUNTER — Other Ambulatory Visit: Payer: Self-pay

## 2019-07-25 ENCOUNTER — Encounter (HOSPITAL_COMMUNITY)
Admission: RE | Disposition: A | Discharge status: Home / Self Care | Payer: Self-pay | Source: Home / Self Care | Attending: Gastroenterology

## 2019-07-25 ENCOUNTER — Ambulatory Visit (HOSPITAL_COMMUNITY)
Admission: RE | Admit: 2019-07-25 | Discharge: 2019-07-25 | Disposition: A | Discharge status: Home / Self Care | Payer: Medicare Other | Attending: Gastroenterology | Admitting: Gastroenterology

## 2019-07-25 DIAGNOSIS — Z825 Family history of asthma and other chronic lower respiratory diseases: Secondary | ICD-10-CM | POA: Insufficient documentation

## 2019-07-25 DIAGNOSIS — Z90710 Acquired absence of both cervix and uterus: Secondary | ICD-10-CM | POA: Diagnosis not present

## 2019-07-25 DIAGNOSIS — Z1211 Encounter for screening for malignant neoplasm of colon: Secondary | ICD-10-CM | POA: Diagnosis not present

## 2019-07-25 DIAGNOSIS — K635 Polyp of colon: Secondary | ICD-10-CM

## 2019-07-25 DIAGNOSIS — D122 Benign neoplasm of ascending colon: Secondary | ICD-10-CM | POA: Diagnosis not present

## 2019-07-25 DIAGNOSIS — E039 Hypothyroidism, unspecified: Secondary | ICD-10-CM | POA: Diagnosis not present

## 2019-07-25 DIAGNOSIS — E669 Obesity, unspecified: Secondary | ICD-10-CM | POA: Diagnosis not present

## 2019-07-25 DIAGNOSIS — I4819 Other persistent atrial fibrillation: Secondary | ICD-10-CM | POA: Insufficient documentation

## 2019-07-25 DIAGNOSIS — K589 Irritable bowel syndrome without diarrhea: Secondary | ICD-10-CM | POA: Insufficient documentation

## 2019-07-25 DIAGNOSIS — I87309 Chronic venous hypertension (idiopathic) without complications of unspecified lower extremity: Secondary | ICD-10-CM | POA: Insufficient documentation

## 2019-07-25 DIAGNOSIS — D869 Sarcoidosis, unspecified: Secondary | ICD-10-CM | POA: Insufficient documentation

## 2019-07-25 DIAGNOSIS — K5730 Diverticulosis of large intestine without perforation or abscess without bleeding: Secondary | ICD-10-CM | POA: Insufficient documentation

## 2019-07-25 DIAGNOSIS — J45909 Unspecified asthma, uncomplicated: Secondary | ICD-10-CM | POA: Insufficient documentation

## 2019-07-25 DIAGNOSIS — I2510 Atherosclerotic heart disease of native coronary artery without angina pectoris: Secondary | ICD-10-CM | POA: Diagnosis not present

## 2019-07-25 DIAGNOSIS — Z8249 Family history of ischemic heart disease and other diseases of the circulatory system: Secondary | ICD-10-CM | POA: Insufficient documentation

## 2019-07-25 DIAGNOSIS — F329 Major depressive disorder, single episode, unspecified: Secondary | ICD-10-CM | POA: Insufficient documentation

## 2019-07-25 DIAGNOSIS — Z86010 Personal history of colonic polyps: Secondary | ICD-10-CM | POA: Insufficient documentation

## 2019-07-25 DIAGNOSIS — Z809 Family history of malignant neoplasm, unspecified: Secondary | ICD-10-CM | POA: Insufficient documentation

## 2019-07-25 DIAGNOSIS — G479 Sleep disorder, unspecified: Secondary | ICD-10-CM | POA: Insufficient documentation

## 2019-07-25 DIAGNOSIS — Z794 Long term (current) use of insulin: Secondary | ICD-10-CM | POA: Diagnosis not present

## 2019-07-25 DIAGNOSIS — K5140 Inflammatory polyps of colon without complications: Secondary | ICD-10-CM | POA: Diagnosis not present

## 2019-07-25 DIAGNOSIS — Z9049 Acquired absence of other specified parts of digestive tract: Secondary | ICD-10-CM | POA: Insufficient documentation

## 2019-07-25 DIAGNOSIS — Z836 Family history of other diseases of the respiratory system: Secondary | ICD-10-CM | POA: Insufficient documentation

## 2019-07-25 DIAGNOSIS — M109 Gout, unspecified: Secondary | ICD-10-CM | POA: Diagnosis not present

## 2019-07-25 DIAGNOSIS — E785 Hyperlipidemia, unspecified: Secondary | ICD-10-CM | POA: Diagnosis not present

## 2019-07-25 DIAGNOSIS — I110 Hypertensive heart disease with heart failure: Secondary | ICD-10-CM | POA: Diagnosis not present

## 2019-07-25 DIAGNOSIS — I5032 Chronic diastolic (congestive) heart failure: Secondary | ICD-10-CM | POA: Insufficient documentation

## 2019-07-25 DIAGNOSIS — Z6841 Body Mass Index (BMI) 40.0 and over, adult: Secondary | ICD-10-CM | POA: Diagnosis not present

## 2019-07-25 DIAGNOSIS — E119 Type 2 diabetes mellitus without complications: Secondary | ICD-10-CM | POA: Insufficient documentation

## 2019-07-25 DIAGNOSIS — Z820 Family history of epilepsy and other diseases of the nervous system: Secondary | ICD-10-CM | POA: Insufficient documentation

## 2019-07-25 HISTORY — PX: COLONOSCOPY WITH PROPOFOL: SHX5780

## 2019-07-25 HISTORY — PX: POLYPECTOMY: SHX5525

## 2019-07-25 LAB — HM COLONOSCOPY

## 2019-07-25 LAB — GLUCOSE, CAPILLARY: Glucose-Capillary: 154 mg/dL — ABNORMAL HIGH (ref 70–99)

## 2019-07-25 SURGERY — COLONOSCOPY WITH PROPOFOL
Anesthesia: Monitor Anesthesia Care

## 2019-07-25 MED ORDER — SODIUM CHLORIDE 0.9 % IV SOLN
INTRAVENOUS | Status: DC
  Administered 2019-07-25: 08:00:00 500 mL via INTRAVENOUS

## 2019-07-25 MED ORDER — PROPOFOL 500 MG/50ML IV EMUL
INTRAVENOUS | Status: DC | PRN
  Administered 2019-07-25: 100 ug/kg/min via INTRAVENOUS

## 2019-07-25 MED ORDER — PROPOFOL 10 MG/ML IV BOLUS
INTRAVENOUS | Status: AC
  Filled 2019-07-25: qty 60

## 2019-07-25 SURGICAL SUPPLY — 22 items

## 2019-07-25 NOTE — Op Note (Signed)
[facility] Patient Name: Stephanie [name] Procedure Date: 07/25/2019 MRN: [id number] Attending MD: Carlota Raspberry. Havery Moros , MD Date of Birth: 1951/10/16 CSN: [id number] Age: 68 Admit Type: Outpatient Procedure:                Colonoscopy Indications:              Surveillance: Personal history of piecemeal removal                            of large right sided adenoma on last colonoscopy                            Jan 2020 Providers:                Carlota Raspberry. Havery Moros, MD, Josie Dixon, RN,                            Elmer Ramp. Tilden Dome, RN, Lina Sar, Technician,                            Greenleaf Armistead, CRNA Referring MD:              Medicines:                Monitored Anesthesia Care Complications:            No immediate complications. Estimated blood loss:                            Minimal. Estimated Blood Loss:     Estimated blood loss was minimal. Procedure:                Pre-Anesthesia Assessment:                           - Prior to the procedure, a History and Physical                            was performed, and patient medications and                            allergies were reviewed. The patient's tolerance of                            previous anesthesia was also reviewed. The risks                            and benefits of the procedure and the sedation                            options and risks were discussed with the patient.                            All questions were answered, and informed consent                            was obtained. Prior  Anticoagulants: The patient has                            taken Pradaxa (dabigatran), last dose was 3 days                            prior to procedure. ASA Grade Assessment: III - A                            patient with severe systemic disease. After                            reviewing the risks and benefits, the patient was                            deemed in satisfactory condition  to undergo the                            procedure.                           After obtaining informed consent, the colonoscope                            was passed under direct vision. Throughout the                            procedure, the patient's blood pressure, pulse, and                            oxygen saturations were monitored continuously. The                            CF-HQ190L ([id number]) Olympus colonoscope was                            introduced through the anus and advanced to the the                            cecum, identified by appendiceal orifice and                            ileocecal valve. The colonoscopy was performed                            without difficulty. The patient tolerated the                            procedure well. The quality of the bowel                            preparation was good. The ileocecal valve,  appendiceal orifice, and rectum were photographed. Scope In: 8:29:18 AM Scope Out: 8:48:10 AM Scope Withdrawal Time: 0 hours 16 minutes 11 seconds  Total Procedure Duration: 0 hours 18 minutes 52 seconds  Findings:      The perianal and digital rectal examinations were normal.      A 2 mm diminutive polyp was found in the ascending colon. The polyp was       sessile, I suspect at site of prior polypectomy. Large tattoo was noted       inferior to the lesion. No other areas of residual polyp noted. The       lesion was removed with a cold snare. Resection and retrieval were       complete.      Multiple medium-mouthed diverticula were found in the sigmoid colon.      The exam was otherwise without abnormality. Impression:               - One diminutive polyp in the ascending colon as                            outlined, removed with a cold snare. Resected and                            retrieved.                           - Diverticulosis in the sigmoid colon.                           - The examination was  otherwise normal. Moderate Sedation:      No moderate sedation, case performed with MAC Recommendation:           - Patient has a contact number available for                            emergencies. The signs and symptoms of potential                            delayed complications were discussed with the                            patient. Return to normal activities tomorrow.                            Written discharge instructions were provided to the                            patient.                           - Resume previous diet.                           - Continue present medications.                           - Resume Pradaxa tomorrow                           -  Await pathology results.                           - Repeat colonoscopy in 3 years for surveillance                            given advanced adenoma on last exam Procedure Code(s):        --- Professional ---                           [phone], Colonoscopy, flexible; with removal of                            tumor(s), polyp(s), or other lesion(s) by snare                            technique Diagnosis Code(s):        --- Professional ---                           K63.5, Polyp of colon                           Z09, Encounter for follow-up examination after                            completed treatment for conditions other than                            malignant neoplasm                           Z86.010, Personal history of colonic polyps                           K57.30, Diverticulosis of large intestine without                            perforation or abscess without bleeding CPT copyright 2019 American Medical Association. All rights reserved. The codes documented in this report are preliminary and upon coder review may  be revised to meet current compliance requirements. Remo Lipps P. Armbruster, MD 07/25/2019 8:56:01 AM This report has been signed electronically. Number of Addenda: 0

## 2019-07-25 NOTE — Discharge Instructions (Signed)
YOU HAD AN ENDOSCOPIC PROCEDURE TODAY: Refer to the procedure report and other information in the discharge instructions given to you for any specific questions about what was found during the examination. If this information does not answer your questions, please call [place] office at [phone] to clarify.  ° °YOU SHOULD EXPECT: Some feelings of bloating in the abdomen. Passage of more gas than usual. Walking can help get rid of the air that was put into your GI tract during the procedure and reduce the bloating. If you had a lower endoscopy (such as a colonoscopy or flexible sigmoidoscopy) you may notice spotting of blood in your stool or on the toilet paper. Some abdominal soreness may be present for a day or two, also. ° °DIET: Your first meal following the procedure should be a light meal and then it is ok to progress to your normal diet. A half-sandwich or bowl of soup is an example of a good first meal. Heavy or fried foods are harder to digest and may make you feel nauseous or bloated. Drink plenty of fluids but you should avoid alcoholic beverages for 24 hours. If you had a esophageal dilation, please see attached instructions for diet.   ° °ACTIVITY: Your care partner should take you home directly after the procedure. You should plan to take it easy, moving slowly for the rest of the day. You can resume normal activity the day after the procedure however YOU SHOULD NOT DRIVE, use power tools, machinery or perform tasks that involve climbing or major physical exertion for 24 hours (because of the sedation medicines used during the test).  ° °SYMPTOMS TO REPORT IMMEDIATELY: °A gastroenterologist can be reached at any hour. Please call [phone]  for any of the following symptoms:  °Following lower endoscopy (colonoscopy, flexible sigmoidoscopy) °Excessive amounts of blood in the stool  °Significant tenderness, worsening of abdominal pains  °Swelling of the abdomen that is new, acute  °Fever of 100° or  higher  °Following upper endoscopy (EGD, EUS, ERCP, esophageal dilation) °Vomiting of blood or coffee ground material  °New, significant abdominal pain  °New, significant chest pain or pain under the shoulder blades  °Painful or persistently difficult swallowing  °New shortness of breath  °Black, tarry-looking or red, bloody stools ° °FOLLOW UP:  °If any biopsies were taken you will be contacted by phone or by letter within the next 1-3 weeks. Call [phone]  if you have not heard about the biopsies in 3 weeks.  °Please also call with any specific questions about appointments or follow up tests. ° °

## 2019-07-25 NOTE — Anesthesia Preprocedure Evaluation (Addendum)
Anesthesia Evaluation  Patient identified by MRN, date of birth, ID band Patient awake    Reviewed: Allergy & Precautions, NPO status , Patient's Chart, lab work & pertinent test results, reviewed documented beta blocker date and time   History of Anesthesia Complications (+) PONVNegative for: history of anesthetic complications  Airway Mallampati: III  TM Distance: >3 FB Neck ROM: Full    Dental  (+) Teeth Intact   Pulmonary asthma ,    Pulmonary exam normal        Cardiovascular hypertension, Pt. on medications and Pt. on home beta blockers +CHF  Normal cardiovascular exam+ dysrhythmias Atrial Fibrillation      Neuro/Psych negative neurological ROS  negative psych ROS   GI/Hepatic negative GI ROS, Neg liver ROS,   Endo/Other  diabetes, Type 2, Insulin Dependent, Oral Hypoglycemic AgentsHypothyroidism Morbid obesity  Renal/GU negative Renal ROS  negative genitourinary   Musculoskeletal  (+) Arthritis ,   Abdominal (+) + obese,   Peds  Hematology negative hematology ROS (+)   Anesthesia Other Findings   Reproductive/Obstetrics                            Anesthesia Physical Anesthesia Plan  ASA: III  Anesthesia Plan: MAC   Post-op Pain Management:    Induction: Intravenous  PONV Risk Score and Plan: 3 and Propofol infusion, TIVA and Treatment may vary due to age or medical condition  Airway Management Planned: Natural Airway, Nasal Cannula and Simple Face Mask  Additional Equipment: None  Intra-op Plan:   Post-operative Plan:   Informed Consent: I have reviewed the patients History and Physical, chart, labs and discussed the procedure including the risks, benefits and alternatives for the proposed anesthesia with the patient or authorized representative who has indicated his/her understanding and acceptance.       Plan Discussed with:   Anesthesia Plan Comments:          Anesthesia Quick Evaluation

## 2019-07-25 NOTE — Transfer of Care (Signed)
Immediate Anesthesia Transfer of Care Note  Patient: Stephanie [name]  Procedure(s) Performed: COLONOSCOPY WITH PROPOFOL (N/A ) POLYPECTOMY  Patient Location: PACU and Endoscopy Unit  Anesthesia Type:MAC  Level of Consciousness: awake, alert , oriented and patient cooperative  Airway & Oxygen Therapy: Patient Spontanous Breathing and Patient connected to face mask oxygen  Post-op Assessment: Report given to RN, Post -op Vital signs reviewed and stable and Patient moving all extremities  Post vital signs: Reviewed and stable  Last Vitals:  Vitals Value Taken Time  BP    Temp    Pulse 66 07/25/19 0855  Resp 18 07/25/19 0855  SpO2 100 % 07/25/19 0855  Vitals shown include unvalidated device data.  Last Pain:  Vitals:   07/25/19 0735  TempSrc: Oral  PainSc: 0-No pain         Complications: No apparent anesthesia complications

## 2019-07-25 NOTE — H&P (Signed)
HPI :  68 y/o female here at the hospital for a colonoscopy. History of large adenoma removed in piecemeal fashion in January, here for surveillance colonoscopy to ensure no residual polyp. She last took Pradaxa 3 days ago. Her case is being done at the hospital due to BMI over 50. No complaints otherwise.  Past Medical History:  Diagnosis Date  . Anemia   . Ankle fracture, right    x 2  . Antineoplastic and immunosuppressive drugs causing adverse effect in therapeutic use   . Asthma   . Chronic venous hypertension without complications   . Colon polyps 2009  . Coronary artery disease    no CAD by cath 11.2010  . Depressive disorder, not elsewhere classified   . Diabetes mellitus type 2 in obese (HCC)    hgb AIC 6.1 09/02/2009, insulin dependent   . Diastolic CHF, chronic (HCC)    reports stable  . Diverticulosis of colon 2009   colonoscopy  . Gout    cortisone  shots helped  . Hepatitis, unspecified    hx of/per pt, never was told of having hepatitis  . Hx of hysterectomy 2002  . Hyperlipidemia   . Hypertension    controlled on medication   . Hypothyroidism    reports her thryoid levels are normal now   . Irritable bowel syndrome   . Lung nodule    25mm RLL nodule on CT Chest  07/2009, resolved by 2011 CT  . Noncompliance   . Obesity   . Persistent atrial fibrillation ([place])    s/p DCCV 07/2009; Pradaxa Rx, states she is in sinus rhythm now   . PONV (postoperative nausea and vomiting)    after receiving "too much anesthesia" after a hernia surgery   . Sarcoidosis    dx by eye exam; seen during eye exam , report asymtptomatic "i dont have it now'   . Sleep disturbance 06/2013   sleep study, mild abnormality, no criteria for CPAP     Past Surgical History:  Procedure Laterality Date  . ABDOMINAL HYSTERECTOMY    . CARDIOVERSION     x 2  . CARDIOVERSION N/A 02/27/2013   Procedure: CARDIOVERSION;  Surgeon: Lelon Perla, MD;  Location[facility] ENDOSCOPY;  Service:  Cardiovascular;  Laterality: N/A;  . CARDIOVERSION N/A 03/24/2017   Procedure: CARDIOVERSION;  Surgeon: Josue Hector, MD;  Location: [facility] ENDOSCOPY;  Service: Cardiovascular;  Laterality: N/A;  . CHOLECYSTECTOMY  early 21s  . COLONOSCOPY     Diverticulosis, colon polyps, repeat 2014, Dr. Deatra Ina  . ESOPHAGOGASTRODUODENOSCOPY N/A 05/16/2014   Procedure: ESOPHAGOGASTRODUODENOSCOPY (EGD);  Surgeon: Wonda Horner, MD;  Location: [facility] ORS;  Service: Gastroenterology;  Laterality: N/A;  . HERNIA REPAIR  [id number]   umbilical hernia  . MYOMECTOMY  2000   Family History  Problem Relation Age of Onset  . Pneumonia Father        deceased  . Hypertension Father   . Cancer Father   . Multiple sclerosis Mother        hx  . Emphysema Other        runs in the family   Social History   Tobacco Use  . Smoking status: Never Smoker  . Smokeless tobacco: Never Used  Substance Use Topics  . Alcohol use: Yes    Alcohol/week: 0.0 standard drinks    Comment: rare  . Drug use: No   Current Facility-Administered Medications  Medication Dose Route Frequency Provider Last Rate Last Dose  .  0.9 %  sodium chloride infusion   Intravenous Continuous Havery Moros Carlota Raspberry, MD 20 mL/hr at 07/25/19 0738 500 mL at 07/25/19 0738   Allergies  Allergen Reactions  . Amiodarone Other (See Comments)    adverse reaction: Tremor/gait disurbance  . Albuterol Palpitations    Palpitations, intolerance  . Atorvastatin Other (See Comments)    Body aches and pains--stiffness.   [name] [Pitavastatin]     Body aches  . Propoxyphene N-Acetaminophen Nausea And Vomiting     Review of Systems: All systems reviewed and negative except where noted in HPI.   Lab Results  Component Value Date   WBC 6.9 07/08/2018   HGB 11.9 (L) 07/08/2018   HCT 36.9 07/08/2018   MCV 96.3 07/08/2018   PLT 172 07/08/2018    Lab Results  Component Value Date   CREATININE 1.24 (H) 05/31/2019   BUN 29 (H) 05/31/2019   NA 134 05/31/2019    K 5.2 05/31/2019   CL 95 (L) 05/31/2019   CO2 25 05/31/2019    Lab Results  Component Value Date   ALT 15 03/01/2018   AST 13 03/01/2018   ALKPHOS 58 03/01/2018   BILITOT 0.4 03/01/2018    Physical Exam: BP (!) 201/103   Pulse 93   Temp 98.6 F (37 C) (Oral)   Resp (!) 22   Ht 5' 2.5" (1.588 m)   Wt 124.7 kg   SpO2 97%   BMI 49.50 kg/m  Constitutional: Pleasant,well-developed, female in no acute distress. HEENT: Normocephalic and atraumatic. Conjunctivae are normal. No scleral icterus. Neck supple.  Cardiovascular: Normal rate, regular rhythm.  Pulmonary/chest: Effort normal and breath sounds normal. No wheezing, rales or rhonchi. Abdominal: Soft, nondistended, protuberant nontender.  There are no masses palpable. No hepatomegaly. Extremities: no edema   ASSESSMENT AND PLAN: 69 y/o female here for surveillance colonoscopy done at the hospital due to BMI for anesthesia support. Last dose of Pradaxa 3 days ago. Discussed risks / benefits patient wishes to proceed. Further recommendations pending the results.  [place] Cellar, MD [facility]

## 2019-07-25 NOTE — Anesthesia Postprocedure Evaluation (Signed)
Anesthesia Post Note  Patient: Stephanie [name]  Procedure(s) Performed: COLONOSCOPY WITH PROPOFOL (N/A ) POLYPECTOMY     Patient location during evaluation: Endoscopy Anesthesia Type: MAC Level of consciousness: awake and alert Pain management: pain level controlled Vital Signs Assessment: post-procedure vital signs reviewed and stable Respiratory status: spontaneous breathing, nonlabored ventilation and respiratory function stable Cardiovascular status: blood pressure returned to baseline and stable Postop Assessment: no apparent nausea or vomiting Anesthetic complications: no    Last Vitals:  Vitals:   07/25/19 0735 07/25/19 0855  BP: (!) 201/103 (!) 143/59  Pulse: 93 70  Resp: (!) 22 19  Temp: 37 C (!) 36 C  SpO2: 97% 100%    Last Pain:  Vitals:   07/25/19 0855  TempSrc: Temporal  PainSc: 0-No pain                 Lidia Collum

## 2019-07-25 NOTE — Interval H&P Note (Signed)
History and Physical Interval Note:  07/25/2019 8:17 AM  Stephanie [name]  has presented today for surgery, with the diagnosis of polyp surveillance.  The various methods of treatment have been discussed with the patient and family. After consideration of risks, benefits and other options for treatment, the patient has consented to  Procedure(s): COLONOSCOPY WITH PROPOFOL (N/A) as a surgical intervention.  The patient's history has been reviewed, patient examined, no change in status, stable for surgery.  I have reviewed the patient's chart and labs.  Questions were answered to the patient's satisfaction.     [place]

## 2019-07-26 ENCOUNTER — Encounter (HOSPITAL_COMMUNITY): Payer: Self-pay | Admitting: Gastroenterology

## 2019-07-26 LAB — SURGICAL PATHOLOGY

## 2019-07-27 ENCOUNTER — Encounter: Payer: Self-pay | Admitting: Gastroenterology

## 2019-10-22 NOTE — Progress Notes (Signed)
Cardiology Office Note Date:  10/24/2019  Patient ID:  Stephanie, [name] 03-Dec-1950, MRN [id number] PCP:  Delrae Rend, MD  Electrophysiologist: Dr. [name] Neuology: Dr. Brett Fairy Gastroenterology: Dr. Deatra Ina   Chief Complaint: 6 mo follow up  History of Present Illness: Stephanie [name] is a 69 y.o. female with history of PAFib, sarcoidosis (eye), IBS, HLD, No obst CAD by cath in 2010, recurrent NICM felt 2/2 tachycardia/AF.   I saw her in 2017,  She was feeling much better off the Methimazole, she was pending referral to a new endocrinologist.  She denied any kind of CP, no overt palpitations, no dizziness, near syncope or syncope,  No SOB.  She had some edema reportedly better then the day prior and attributed this to not having been up and around to much.   She assured me she has not missed a single dose of her Pradaxa since her last visit a month ago, has been very religious about it.  She denies any bleeding or signs of bleeding.  She has been inadvertently taking 2 coreg tablets BID though.   She was noted to have recurrent reduction in her EF, she was in an AFib/flutter (90bpm) and planned for DCCV.  She comes in today to be seen for Dr. [name], last seen by him July 2020, by symptoms had not thought she had been having any AF.  No changes were made Most recent echo Dec 2019, LVEF 55-60%   She is doing OK, very worried about COVID.  She has some brief palpitations, at longest and hour or 2, infrequently with no associated symptoms She mentions heartburn of late She notices a burning in the center of her chest when she drinks soda especially on an empty stomach that just self resolves.  Not exertional or positional, no CP otherwise She does not exercise, but denies any CP, SOB/DOE or difficulties with her ADLs No dizzy spells, no near syncope or syncope  No bleeding or signs of bleeding   Her AF/AAD history: --DCCV 2010, May 2014 with raid return of AF then sponatenous conversion  to SR --March 2015 discussed with Dr. Rayann Heman possible ablation but did not want to persue this, her amio was reduced to 269m daily and recommended lifestyle adjustments --Amiodarone ultimately stopped with concerns of possible toxicity with neuro symptoms of staggering/tremor with improvement off as well as hyperthyroidism  -- CM felt to be AF/rate related 25% IN 2012, that is improved to 50-55% in 2014>> Dec 2017 again 25% in 2018 -- DCCV (x2 shocks) 03/24/2017 -- Mild abnormal sleep study no indication for CPAP 2014   Past Medical History:  Diagnosis Date  . Anemia   . Ankle fracture, right    x 2  . Antineoplastic and immunosuppressive drugs causing adverse effect in therapeutic use   . Asthma   . Chronic venous hypertension without complications   . Colon polyps 2009  . Coronary artery disease    no CAD by cath 11.2010  . Depressive disorder, not elsewhere classified   . Diabetes mellitus type 2 in obese (HCC)    hgb AIC 6.1 09/02/2009, insulin dependent   . Diastolic CHF, chronic (HCC)    reports stable  . Diverticulosis of colon 2009   colonoscopy  . Gout    cortisone  shots helped  . Hepatitis, unspecified    hx of/per pt, never was told of having hepatitis  . Hx of hysterectomy 2002  . Hyperlipidemia   . Hypertension    controlled  on medication   . Hypothyroidism    reports her thryoid levels are normal now   . Irritable bowel syndrome   . Lung nodule    71m RLL nodule on CT Chest  07/2009, resolved by 2011 CT  . Noncompliance   . Obesity   . Persistent atrial fibrillation ([place]    s/p DCCV 07/2009; Pradaxa Rx, states she is in sinus rhythm now   . PONV (postoperative nausea and vomiting)    after receiving "too much anesthesia" after a hernia surgery   . Sarcoidosis    dx by eye exam; seen during eye exam , report asymtptomatic "i dont have it now'   . Sleep disturbance 06/2013   sleep study, mild abnormality, no criteria for CPAP    Past Surgical History:    Procedure Laterality Date  . ABDOMINAL HYSTERECTOMY    . CARDIOVERSION     x 2  . CARDIOVERSION N/A 02/27/2013   Procedure: CARDIOVERSION;  Surgeon: BLelon Perla MD;  [facility];  Service: Cardiovascular;  Laterality: N/A;  . CARDIOVERSION N/A 03/24/2017   Procedure: CARDIOVERSION;  Surgeon: NJosue Hector MD;  [facility];  Service: Cardiovascular;  Laterality: N/A;  . CHOLECYSTECTOMY  early 820s . COLONOSCOPY     Diverticulosis, colon polyps, repeat 2014, Dr. KDeatra Ina . COLONOSCOPY WITH PROPOFOL N/A 07/25/2019   Procedure: COLONOSCOPY WITH PROPOFOL;  Surgeon: AYetta Flock MD;  Location: [facility] ENDOSCOPY;  Service: Gastroenterology;  Laterality: N/A;  . ESOPHAGOGASTRODUODENOSCOPY N/A 05/16/2014   Procedure: ESOPHAGOGASTRODUODENOSCOPY (EGD);  Surgeon: SWonda Horner MD;  Location: [facility] ORS;  Service: Gastroenterology;  Laterality: N/A;  . HERNIA REPAIR  21610  umbilical hernia  . MYOMECTOMY  2000  . POLYPECTOMY  07/25/2019   Procedure: POLYPECTOMY;  Surgeon: AYetta Flock MD;  [facility];  Service: Gastroenterology;;    Current Outpatient Medications  Medication Sig Dispense Refill  . carvedilol (COREG) 25 MG tablet TAKE 1 TABLET(25 MG) BY MOUTH TWICE DAILY (Patient taking differently: Take 25 mg by mouth 2 (two) times daily with a meal. ) 180 tablet 1  . colchicine 0.6 MG tablet TAKE 1 TABLET BY MOUTH EVERY DAY AS NEEDED(GOUT) (Patient taking differently: Take 0.6 mg by mouth as needed (Gout). ) 30 tablet 0  . furosemide (LASIX) 40 MG tablet Take 1.5 tablets (60 mg total) by mouth daily. Weight daily and take additional lasix 40 mg for weight gain of 2-3 pounds (Patient taking differently: Take 40 mg by mouth daily. Weight daily and take additional lasix 40 mg for weight gain of 2-3 pounds) 135 tablet 3  . hydrOXYzine (ATARAX/VISTARIL) 10 MG tablet Take 1 tablet (10 mg total) by mouth 3 (three) times daily as needed. 30 tablet 0  . Insulin  Pen Needle (NOVOFINE) 30G X 8 MM MISC For Humalog meal time TID 100 each 11  . metFORMIN (GLUCOPHAGE) 500 MG tablet Take 500 mg by mouth 2 (two) times daily.     .Marland KitchenNOVOLOG FLEXPEN 100 UNIT/ML FlexPen Inject 13 Units into the skin 2 (two) times daily.     . ondansetron (ZOFRAN-ODT) 4 MG disintegrating tablet Take 1 tablet (4 mg total) by mouth every 6 (six) hours as needed for nausea or vomiting. 60 tablet 1  . polyethylene glycol (MIRALAX) 17 g packet Take 17 g by mouth daily as needed. 14 each 0  . PRADAXA 150 MG CAPS capsule TAKE ONE CAPSULE BY MOUTH TWICE DAILY (Patient taking differently: Take 150  mg by mouth 2 (two) times daily. ) 60 capsule 5  . sacubitril-valsartan (ENTRESTO) 24-26 MG Take 1 tablet by mouth 2 (two) times daily. 60 tablet 11  . spironolactone (ALDACTONE) 25 MG tablet Take 1 tablet (25 mg total) by mouth daily. 90 tablet 3  . SUPREP BOWEL PREP KIT 17.5-3.13-1.6 GM/177ML SOLN Suprep-Use as directed 354 mL 0  . traZODone (DESYREL) 50 MG tablet Take 0.5-1 tablets (25-50 mg total) by mouth at bedtime as needed for sleep. 30 tablet 3   No current facility-administered medications for this visit.    Allergies:   Amiodarone, Albuterol, Atorvastatin, Livalo [pitavastatin], and Propoxyphene n-acetaminophen   Social History:  The patient  reports that she has never smoked. She has never used smokeless tobacco. She reports current alcohol use. She reports that she does not use drugs.   Family History:  The patient's family history includes Cancer in her father; Emphysema in an other family member; Hypertension in her father; Multiple sclerosis in her mother; Pneumonia in her father.  ROS:  Please see the history of present illness.   All other systems are reviewed and otherwise negative.   PHYSICAL EXAM:  VS:  BP 139/76   Pulse 64   Ht '5\' 3"'  (1.6 m)   Wt 288 lb (130.6 kg)   BMI 51.02 kg/m  BMI: Body mass index is 51.02 kg/m. Very plesant BF, obese, in no acute distress    HEENT: normocephalic, atraumatic  Neck: no JVD, carotid bruits or masses Cardiac: RRR,  no significant murmurs, no rubs, or gallops Lungs: CTA b/l, no wheezing, rhonchi or rales  Abd: soft, nontender MS: no deformity or atrophy Ext:  No edema b/l LE  Skin: warm and dry, no rash Neuro:  No gross deficits appreciated Psych: euthymic mood, full affect     EKG:  Done today and reviewed by myself:SR 62bpm, nonspecific ST/T changes, in comparison to last SR EKG 09/26/18, not new, more pronounced   2018/10/06: TTE Study Conclusions - Left ventricle: The cavity size was normal. Wall thickness was   increased in a pattern of mild LVH. Systolic function was normal.   The estimated ejection fraction was in the range of 55% to 60%.   Wall motion was normal; there were no regional wall motion   abnormalities. Features are consistent with a pseudonormal left   ventricular filling pattern, with concomitant abnormal relaxation   and increased filling pressure (grade 2 diastolic dysfunction).  03/03/2018: TTE Study Conclusions - Left ventricle: The cavity size was normal. There was moderate   concentric hypertrophy. Systolic function was severely reduced.   The estimated ejection fraction was in the range of 15% to 20%.   Diffuse hypokinesis. - Aortic valve: There was no regurgitation. - Aortic root: The aortic root was normal in size. - Mitral valve: There was mild regurgitation. - Left atrium: The atrium was moderately dilated. - Right ventricle: Systolic function was normal. - Right atrium: The atrium was mildly dilated. - Tricuspid valve: There was mild regurgitation. - Pulmonary arteries: Systolic pressure was within the normal   range. - Inferior vena cava: The vessel was normal in size. - Pericardium, extracardiac: There was no pericardial effusion.  Impressions: - Images with echocontrast show no thrombus in the left ventricle   but there is significant smoke.    10/18/16  TTE Study Conclusions - Left ventricle: The cavity size was mildly dilated. There was   moderate concentric hypertrophy. Systolic function was severely   reduced.  The estimated ejection fraction was 25%. Diffuse   hypokinesis. The study is not technically sufficient to allow   evaluation of LV diastolic function. - Mitral valve: Calcified annulus. Mildly thickened leaflets .   There was mild regurgitation. - Left atrium: The atrium was severely dilated. (4m) - Right ventricle: Systolic function was moderately reduced. - Right atrium: The atrium was mildly dilated. - Tricuspid valve: There was mild regurgitation.   05/29/13: Echocardiogram Study Conclusions Left ventricle: LVEF is approximately 50 to 55% with very mild inferior and septal hypokinesis  02/08/13: Echocardiogram Study Conclusions - Left ventricle: The cavity size was mildly dilated. Wall thickness was increased in a pattern of moderate LVH. Systolic function was severely reduced. The estimated ejection fraction was in the range of 25% to 30%. - Left atrium: The atrium was mildly dilated.  Recent Labs: 05/31/2019: BUN 29; Creatinine, Ser 1.24; Potassium 5.2; Sodium 134  No results found for requested labs within last 8760 hours.   CrCl cannot be calculated (Patient's most recent lab result is older than the maximum 21 days allowed.).   Wt Readings from Last 3 Encounters:  10/24/19 288 lb (130.6 kg)  07/20/19 275 lb (124.7 kg)  07/05/19 293 lb (132.9 kg)     Other studies reviewed: Additional studies/records reviewed today include: summarized above  ASSESSMENT AND PLAN:  1. PAFib     CHADS2Vasc is at least 3 on Pradaxa, appropriately dosed by last labs     Minimal/few symptoms of AFib    2. HTN     No changes today     Discussed diet, exercise weight loss   3. NICM    Last echo looked better       On BB/ARB, diuretic    Weight is stable, down a few pounds from last weight     No symptoms or  exam findigs to suggest volume OL   4. Heart burn     Very much sounds of GI etiology     Reviewed her EKG and story/symptom description with DOD today,      Agreed, sounds GI by her description, though should she have any changes to her symptom, escalation would pursue further testing.  Discussed this with the patient, she will avoid periods of fasting and drinking sodas, monitor the symptoms, if no improvement or any escalation/change she will notify [place]       Disposition: BMET/CBC today, follow up as already scheduled, sooner if needed  Current medicines are reviewed at length with the patient today.  The patient did not have any concerns regarding medicines.  SHaywood Lasso PA-C 10/24/2019 12:17 PM     [place]he[place] [phone](office)  [phone](fax)

## 2019-10-24 ENCOUNTER — Other Ambulatory Visit: Payer: Self-pay

## 2019-10-24 ENCOUNTER — Ambulatory Visit (INDEPENDENT_AMBULATORY_CARE_PROVIDER_SITE_OTHER): Payer: Medicare Other | Admitting: Physician Assistant

## 2019-10-24 VITALS — BP 139/76 | HR 64 | Ht 63.0 in | Wt 288.0 lb

## 2019-10-24 DIAGNOSIS — I10 Essential (primary) hypertension: Secondary | ICD-10-CM | POA: Diagnosis not present

## 2019-10-24 DIAGNOSIS — I428 Other cardiomyopathies: Secondary | ICD-10-CM

## 2019-10-24 DIAGNOSIS — Z79899 Other long term (current) drug therapy: Secondary | ICD-10-CM | POA: Diagnosis not present

## 2019-10-24 DIAGNOSIS — I480 Paroxysmal atrial fibrillation: Secondary | ICD-10-CM | POA: Diagnosis not present

## 2019-10-24 NOTE — Patient Instructions (Signed)
Medication Instructions:   Your physician recommends that you continue on your current medications as directed. Please refer to the Current Medication list given to you today.  *If you need a refill on your cardiac medications before your next appointment, please call your pharmacy*  Lab Work:   BMET AND CBC TODAY   If you have labs (blood work) drawn today and your tests are completely normal, you will receive your results only by: Marland Kitchen MyChart Message (if you have MyChart) OR . A paper copy in the mail If you have any lab test that is abnormal or we need to change your treatment, we will call you to review the results.  Testing/Procedures:  NONE ORDERED  TODAY  Follow-Up: At [facility] you and your health needs are our priority.  As part of our continuing mission to provide you with exceptional heart care, we have created designated Provider Care Teams.  These Care Teams include your primary Cardiologist (physician) and Advanced Practice Providers (APPs -  Physician Assistants and Nurse Practitioners) who all work together to provide you with the care you need, when you need it.  Your next appointment:   6 month(s)  The format for your next appointment:   In Person  Provider:   You may see Dr.  [name]  or one of the following Advanced Practice Providers on your designated Care Team:    Chanetta Marshall, NP  Tommye Standard, PA-C  Legrand Como "Oda Kilts, [place]   Other Instructions

## 2019-10-25 LAB — CBC
Hematocrit: 34.1 % (ref 34.0–46.6)
Hemoglobin: 10.6 g/dL — ABNORMAL LOW (ref 11.1–15.9)
MCH: 29.6 pg (ref 26.6–33.0)
MCHC: 31.1 g/dL — ABNORMAL LOW (ref 31.5–35.7)
MCV: 95 fL (ref 79–97)
Platelets: 192 10*3/uL (ref 150–450)
RBC: 3.58 x10E6/uL — ABNORMAL LOW (ref 3.77–5.28)
RDW: 12.3 % (ref 11.7–15.4)
WBC: 5.7 10*3/uL (ref 3.4–10.8)

## 2019-10-25 LAB — BASIC METABOLIC PANEL
BUN/Creatinine Ratio: 17 (ref 12–28)
BUN: 22 mg/dL (ref 8–27)
CO2: 24 mmol/L (ref 20–29)
Calcium: 9.5 mg/dL (ref 8.7–10.3)
Chloride: 94 mmol/L — ABNORMAL LOW (ref 96–106)
Creatinine, Ser: 1.28 mg/dL — ABNORMAL HIGH (ref 0.57–1.00)
GFR calc Af Amer: 50 mL/min/{1.73_m2} — ABNORMAL LOW (ref >59)
GFR calc non Af Amer: 43 mL/min/{1.73_m2} — ABNORMAL LOW (ref >59)
Glucose: 434 mg/dL — ABNORMAL HIGH (ref 65–99)
Potassium: 4.9 mmol/L (ref 3.5–5.2)
Sodium: 132 mmol/L — ABNORMAL LOW (ref 134–144)

## 2019-10-26 NOTE — Addendum Note (Signed)
Addended by: Claude Manges on: 10/26/2019 01:31 PM   Modules accepted: Orders

## 2019-11-27 DIAGNOSIS — E669 Obesity, unspecified: Secondary | ICD-10-CM | POA: Diagnosis not present

## 2019-11-27 DIAGNOSIS — I10 Essential (primary) hypertension: Secondary | ICD-10-CM | POA: Diagnosis not present

## 2019-11-27 DIAGNOSIS — E1165 Type 2 diabetes mellitus with hyperglycemia: Secondary | ICD-10-CM | POA: Diagnosis not present

## 2019-12-04 ENCOUNTER — Other Ambulatory Visit: Payer: Self-pay | Admitting: Internal Medicine

## 2019-12-13 ENCOUNTER — Other Ambulatory Visit: Payer: Self-pay | Admitting: Internal Medicine

## 2019-12-13 NOTE — Telephone Encounter (Signed)
Pt last saw Tommye Standard, [place] on 10/24/19, last labs 10/24/19 Creat 1.28, age 69, weight 130.6kg, CrCl 87.93, based on specified criteria pt is on appropriate dosage of Pradaxa 150mg  BID.  Will refill rx.

## 2019-12-17 DIAGNOSIS — Z23 Encounter for immunization: Secondary | ICD-10-CM | POA: Diagnosis not present

## 2019-12-20 DIAGNOSIS — I428 Other cardiomyopathies: Secondary | ICD-10-CM | POA: Insufficient documentation

## 2019-12-25 ENCOUNTER — Other Ambulatory Visit: Payer: Self-pay | Admitting: Internal Medicine

## 2019-12-25 ENCOUNTER — Encounter (INDEPENDENT_AMBULATORY_CARE_PROVIDER_SITE_OTHER): Payer: Self-pay

## 2019-12-25 ENCOUNTER — Ambulatory Visit (INDEPENDENT_AMBULATORY_CARE_PROVIDER_SITE_OTHER): Payer: Medicare Other | Admitting: Internal Medicine

## 2019-12-25 ENCOUNTER — Other Ambulatory Visit: Payer: Self-pay

## 2019-12-25 ENCOUNTER — Encounter: Payer: Self-pay | Admitting: Internal Medicine

## 2019-12-25 VITALS — BP 126/82 | HR 122 | Ht 63.0 in | Wt 291.0 lb

## 2019-12-25 DIAGNOSIS — I4811 Longstanding persistent atrial fibrillation: Secondary | ICD-10-CM | POA: Diagnosis not present

## 2019-12-25 DIAGNOSIS — Z01812 Encounter for preprocedural laboratory examination: Secondary | ICD-10-CM | POA: Diagnosis not present

## 2019-12-25 DIAGNOSIS — I428 Other cardiomyopathies: Secondary | ICD-10-CM | POA: Diagnosis not present

## 2019-12-25 DIAGNOSIS — I5032 Chronic diastolic (congestive) heart failure: Secondary | ICD-10-CM

## 2019-12-25 NOTE — Progress Notes (Signed)
Patient Care Team: Delrae Rend, MD as PCP - General (Endocrinology)   HPI  Stephanie [name] is a 69 y.o. female Seen in followup for atrial fibrillation in the context of morbid obesity, sarcoid and mild left ventricular hypertrophy.   She underwent cardioversion may/14 with the hopes of restoring sinus rhythm and seeing recovery of LV systolic function. She had rapidly reverted to afib and then spontaneously reverted to sinus rhythm. There has been  interval near normalization of LV systolic function confirmed by echo August 2014   She saw Dr. Greggory Brandy 3/15 to consider catheter ablation.  She was not interested. He reduced her amiodarone to 200 mg a day. Encouraged her to refrain from alcohol and to work on blood pressure and weight reduction.  She had a sleep study 2014 with an AHI of 7.5    She is now being followed by Dr. Buddy Duty and he is working both on her thyroid as well as her diabetes.   DATE TEST EF   2012 LHC  Cors w/o obs dis  3/12 Echo     25 %   8/14 Echo  55-60 %   12/17 Echo  25-30% LAE (47/2.0/45)  5/19 Echo 15-20%   12/19 Echo  55-65%       Date TSH Cr K Hgb  6/17 1.9     1/18 <<0.01     3/18 2.2     6/18 7.4 1.1  12.6  5/19  1.09 4.6 11.1  9/19  1.58 4.9 11.8  11/19  1.42 5.2   5/20 4.02           . She notes that her weight is up.  Significantly worsening dyspnea on exertion.  Some swelling.  Notes wheezing but this is in her upper airway.  No chest pain   Remote history of a sleep study.  Some sleep disordered breathing   Continues to struggle with depression   Past Medical History:  Diagnosis Date  . Anemia   . Ankle fracture, right    x 2  . Antineoplastic and immunosuppressive drugs causing adverse effect in therapeutic use   . Asthma   . Chronic venous hypertension without complications   . Colon polyps 2009  . Coronary artery disease    no CAD by cath 11.2010  . Depressive disorder, not elsewhere classified   . Diabetes mellitus  type 2 in obese (HCC)    hgb AIC 6.1 09/02/2009, insulin dependent   . Diastolic CHF, chronic (HCC)    reports stable  . Diverticulosis of colon 2009   colonoscopy  . Gout    cortisone  shots helped  . Hepatitis, unspecified    hx of/per pt, never was told of having hepatitis  . Hx of hysterectomy 2002  . Hyperlipidemia   . Hypertension    controlled on medication   . Hypothyroidism    reports her thryoid levels are normal now   . Irritable bowel syndrome   . Lung nodule    71m RLL nodule on CT Chest  07/2009, resolved by 2011 CT  . Noncompliance   . Obesity   . Persistent atrial fibrillation ([place]    s/p DCCV 07/2009; Pradaxa Rx, states she is in sinus rhythm now   . PONV (postoperative nausea and vomiting)    after receiving "too much anesthesia" after a hernia surgery   . Sarcoidosis    dx by eye exam; seen during eye exam , report asymtptomatic "i  dont have it now'   . Sleep disturbance 06/2013   sleep study, mild abnormality, no criteria for CPAP    Past Surgical History:  Procedure Laterality Date  . ABDOMINAL HYSTERECTOMY    . CARDIOVERSION     x 2  . CARDIOVERSION N/A 02/27/2013   Procedure: CARDIOVERSION;  Surgeon: Lelon Perla, MD;  Location[facility];  Service: Cardiovascular;  Laterality: N/A;  . CARDIOVERSION N/A 03/24/2017   Procedure: CARDIOVERSION;  Surgeon: Josue Hector, MD;  Location: [facility] ENDOSCOPY;  Service: Cardiovascular;  Laterality: N/A;  . CHOLECYSTECTOMY  early 41s  . COLONOSCOPY     Diverticulosis, colon polyps, repeat 2014, Dr. Deatra Ina  . COLONOSCOPY WITH PROPOFOL N/A 07/25/2019   Procedure: COLONOSCOPY WITH PROPOFOL;  Surgeon: Yetta Flock, MD;  Location: [facility] ENDOSCOPY;  Service: Gastroenterology;  Laterality: N/A;  . ESOPHAGOGASTRODUODENOSCOPY N/A 05/16/2014   Procedure: ESOPHAGOGASTRODUODENOSCOPY (EGD);  Surgeon: Wonda Horner, MD;  Location: [facility] OR[facility];  Service: Gastroenterology;  Laterality: N/A;  . HERNIA REPAIR  [id number]    umbilical hernia  . MYOMECTOMY  2000  . POLYPECTOMY  07/25/2019   Procedure: POLYPECTOMY;  Surgeon: Yetta Flock, MD;  [facility];  Service: Gastroenterology;;    Current Outpatient Medications  Medication Sig Dispense Refill  . carvedilol (COREG) 25 MG tablet TAKE 1 TABLET(25 MG) BY MOUTH TWICE DAILY (Patient taking differently: Take 25 mg by mouth 2 (two) times daily with a meal. ) 180 tablet 1  . cholecalciferol (VITAMIN D3) 25 MCG (1000 UNIT) tablet Take 1,000 Units by mouth daily.    . colchicine 0.6 MG tablet TAKE 1 TABLET BY MOUTH EVERY DAY AS NEEDED(GOUT) (Patient taking differently: Take 0.6 mg by mouth as needed (Gout). ) 30 tablet 0  . ENTRESTO 24-26 MG TAKE 1 TABLET BY MOUTH TWICE DAILY 60 tablet 11  . furosemide (LASIX) 40 MG tablet Take 1.5 tablets (60 mg total) by mouth daily. Weight daily and take additional lasix 40 mg for weight gain of 2-3 pounds (Patient taking differently: Take 40 mg by mouth daily. Weight daily and take additional lasix 40 mg for weight gain of 2-3 pounds) 135 tablet 3  . hydrOXYzine (ATARAX/VISTARIL) 10 MG tablet Take 1 tablet (10 mg total) by mouth 3 (three) times daily as needed. 30 tablet 0  . Insulin Pen Needle (NOVOFINE) 30G X 8 MM MISC For Humalog meal time TID 100 each 11  . metFORMIN (GLUCOPHAGE) 500 MG tablet Take 500 mg by mouth 2 (two) times daily.     . Multiple Vitamins-Minerals (ECHINACEA ACZ PO) Take 1 tablet by mouth daily.    Marland Kitchen NOVOLOG FLEXPEN 100 UNIT/ML FlexPen Inject 13 Units into the skin 2 (two) times daily.     . ondansetron (ZOFRAN-ODT) 4 MG disintegrating tablet Take 1 tablet (4 mg total) by mouth every 6 (six) hours as needed for nausea or vomiting. 60 tablet 1  . polyethylene glycol (MIRALAX) 17 g packet Take 17 g by mouth daily as needed. 14 each 0  . PRADAXA 150 MG CAPS capsule TAKE 1 CAPSULE BY MOUTH TWICE DAILY 60 capsule 5  . simvastatin (ZOCOR) 10 MG tablet Take 10 mg by mouth at bedtime.    Marland Kitchen  spironolactone (ALDACTONE) 25 MG tablet Take 1 tablet (25 mg total) by mouth daily. 90 tablet 3  . SUPREP BOWEL PREP KIT 17.5-3.13-1.6 GM/177ML SOLN Suprep-Use as directed 354 mL 0  . traZODone (DESYREL) 50 MG tablet Take 0.5-1 tablets (25-50 mg total) by mouth  at bedtime as needed for sleep. 30 tablet 3   No current facility-administered medications for this visit.    Allergies  Allergen Reactions  . Amiodarone Other (See Comments)    adverse reaction: Tremor/gait disurbance  . Albuterol Palpitations    Palpitations, intolerance  . Atorvastatin Other (See Comments)    Body aches and pains--stiffness.   [name] [Pitavastatin]     Body aches  . Propoxyphene N-Acetaminophen Nausea And Vomiting    Review of Systems negative except from HPI and PMH  Physical Exam   BP 126/82   Pulse (!) 122   Ht '5\' 3"'  (1.6 m)   Wt 291 lb (132 kg)   SpO2 98%   BMI 51.55 kg/m  Well developed and nourished in no acute distress HENT normal Neck supple with JVP  Clear Regular rate and rhythm, no murmurs or gallops Abd-soft with active BS No Clubbing cyanosis 1+edema Skin-warm and dry A & Oriented  Grossly normal sensory and motor function  ECG atrial fibrillation at 122 Intervals-/10/34    Assessment and  Plan  Hypertension     Atrial fibrillation-persistent    Cardiomyopathy non ischemic resolved intercurrently  Congestive heart failure acute/chronic class 3  Sarcoid  Obesity - morbid   Tremor   Euthyroid      Patient with worsening heart failure in the context of currently identified atrial fibrillation with a rapid ventricular response.  Pulse ox is unfortunately unable to detect her atrial fibrillation so have reviewed with her the ability to take her carotid pulse.  We will undertake cardioversion.  Hopefully taking her own pulse she will be able to determine sinus versus nonsinus rhythm.  May need antiarrhythmic therapy to maintain sinus rhythm.  Hopefully  restoration of sinus rhythm will obviate the need for augmented diuretics.  Worried a little bit about the impact of rapid rates on her cardiomyopathy.  We will plan an echocardiogram about 6 weeks following cardioversion.

## 2019-12-25 NOTE — H&P (View-Only) (Signed)
Patient Care Team: Delrae Rend, MD as PCP - General (Endocrinology)   HPI  Stephanie [name] is a 69 y.o. female Seen in followup for atrial fibrillation in the context of morbid obesity, sarcoid and mild left ventricular hypertrophy.   She underwent cardioversion may/14 with the hopes of restoring sinus rhythm and seeing recovery of LV systolic function. She had rapidly reverted to afib and then spontaneously reverted to sinus rhythm. There has been  interval near normalization of LV systolic function confirmed by echo August 2014   She saw Dr. Greggory Brandy 3/15 to consider catheter ablation.  She was not interested. He reduced her amiodarone to 200 mg a day. Encouraged her to refrain from alcohol and to work on blood pressure and weight reduction.  She had a sleep study 2014 with an AHI of 7.5    She is now being followed by Dr. Buddy Duty and he is working both on her thyroid as well as her diabetes.   DATE TEST EF   2012 [facility]  [place] w/o obs dis  3/12 Echo     25 %   8/14 Echo  55-60 %   12/17 Echo  25-30% LAE (47/2.0/45)  5/19 Echo 15-20%   12/19 Echo  55-65%       Date TSH Cr K Hgb  6/17 1.9     1/18 <<0.01     3/18 2.2     6/18 7.4 1.1  12.6  5/19  1.09 4.6 11.1  9/19  1.58 4.9 11.8  11/19  1.42 5.2   5/20 4.02           . She notes that her weight is up.  Significantly worsening dyspnea on exertion.  Some swelling.  Notes wheezing but this is in her upper airway.  No chest pain   Remote history of a sleep study.  Some sleep disordered breathing   Continues to struggle with depression   Past Medical History:  Diagnosis Date  . Anemia   . Ankle fracture, right    x 2  . Antineoplastic and immunosuppressive drugs causing adverse effect in therapeutic use   . Asthma   . Chronic venous hypertension without complications   . Colon polyps 2009  . Coronary artery disease    no CAD by cath 11.2010  . Depressive disorder, not elsewhere classified   . Diabetes mellitus  type 2 in obese (HCC)    hgb AIC 6.1 09/02/2009, insulin dependent   . Diastolic CHF, chronic (HCC)    reports stable  . Diverticulosis of colon 2009   colonoscopy  . Gout    cortisone  shots helped  . Hepatitis, unspecified    hx of/per pt, never was told of having hepatitis  . Hx of hysterectomy 2002  . Hyperlipidemia   . Hypertension    controlled on medication   . Hypothyroidism    reports her thryoid levels are normal now   . Irritable bowel syndrome   . Lung nodule    53m RLL nodule on CT Chest  07/2009, resolved by 2011 CT  . Noncompliance   . Obesity   . Persistent [place]    s/p DCCV 07/2009; Pradaxa Rx, states she is in sinus rhythm now   . PONV (postoperative nausea and vomiting)    after receiving "too much anesthesia" after a hernia surgery   . Sarcoidosis    dx by eye exam; seen during eye exam , report asymtptomatic "i  dont have it now'   . Sleep disturbance 06/2013   sleep study, mild abnormality, no criteria for CPAP    Past Surgical History:  Procedure Laterality Date  . ABDOMINAL HYSTERECTOMY    . CARDIOVERSION     x 2  . CARDIOVERSION N/A 02/27/2013   Procedure: CARDIOVERSION;  Surgeon: Lelon Perla, MD;  Location: [facility] ENDOSCOPY;  Service: Cardiovascular;  Laterality: N/A;  . CARDIOVERSION N/A 03/24/2017   Procedure: CARDIOVERSION;  Surgeon: Josue Hector, MD;  Location: [facility] ENDOSCOPY;  Service: Cardiovascular;  Laterality: N/A;  . CHOLECYSTECTOMY  early 65s  . COLONOSCOPY     Diverticulosis, colon polyps, repeat 2014, Dr. Deatra Ina  . COLONOSCOPY WITH PROPOFOL N/A 07/25/2019   Procedure: COLONOSCOPY WITH PROPOFOL;  Surgeon: Yetta Flock, MD;  Location: [facility] ENDOSCOPY;  Service: Gastroenterology;  Laterality: N/A;  . ESOPHAGOGASTRODUODENOSCOPY N/A 05/16/2014   Procedure: ESOPHAGOGASTRODUODENOSCOPY (EGD);  Surgeon: Wonda Horner, MD;  Location: [facility] ORS;  Service: Gastroenterology;  Laterality: N/A;  . HERNIA REPAIR  [id number]    umbilical hernia  . MYOMECTOMY  2000  . POLYPECTOMY  07/25/2019   Procedure: POLYPECTOMY;  Surgeon: Yetta Flock, MD;  Location: [facility] ENDOSCOPY;  Service: Gastroenterology;;    Current Outpatient Medications  Medication Sig Dispense Refill  . carvedilol (COREG) 25 MG tablet TAKE 1 TABLET(25 MG) BY MOUTH TWICE DAILY (Patient taking differently: Take 25 mg by mouth 2 (two) times daily with a meal. ) 180 tablet 1  . cholecalciferol (VITAMIN D3) 25 MCG (1000 UNIT) tablet Take 1,000 Units by mouth daily.    . colchicine 0.6 MG tablet TAKE 1 TABLET BY MOUTH EVERY DAY AS NEEDED(GOUT) (Patient taking differently: Take 0.6 mg by mouth as needed (Gout). ) 30 tablet 0  . ENTRESTO 24-26 MG TAKE 1 TABLET BY MOUTH TWICE DAILY 60 tablet 11  . furosemide (LASIX) 40 MG tablet Take 1.5 tablets (60 mg total) by mouth daily. Weight daily and take additional lasix 40 mg for weight gain of 2-3 pounds (Patient taking differently: Take 40 mg by mouth daily. Weight daily and take additional lasix 40 mg for weight gain of 2-3 pounds) 135 tablet 3  . hydrOXYzine (ATARAX/VISTARIL) 10 MG tablet Take 1 tablet (10 mg total) by mouth 3 (three) times daily as needed. 30 tablet 0  . Insulin Pen Needle (NOVOFINE) 30G X 8 MM MISC For Humalog meal time TID 100 each 11  . metFORMIN (GLUCOPHAGE) 500 MG tablet Take 500 mg by mouth 2 (two) times daily.     . Multiple Vitamins-Minerals (ECHINACEA ACZ PO) Take 1 tablet by mouth daily.    Marland Kitchen NOVOLOG FLEXPEN 100 UNIT/ML FlexPen Inject 13 Units into the skin 2 (two) times daily.     . ondansetron (ZOFRAN-ODT) 4 MG disintegrating tablet Take 1 tablet (4 mg total) by mouth every 6 (six) hours as needed for nausea or vomiting. 60 tablet 1  . polyethylene glycol (MIRALAX) 17 g packet Take 17 g by mouth daily as needed. 14 each 0  . PRADAXA 150 MG CAPS capsule TAKE 1 CAPSULE BY MOUTH TWICE DAILY 60 capsule 5  . simvastatin (ZOCOR) 10 MG tablet Take 10 mg by mouth at bedtime.    Marland Kitchen  spironolactone (ALDACTONE) 25 MG tablet Take 1 tablet (25 mg total) by mouth daily. 90 tablet 3  . SUPREP BOWEL PREP KIT 17.5-3.13-1.6 GM/177ML SOLN Suprep-Use as directed 354 mL 0  . traZODone (DESYREL) 50 MG tablet Take 0.5-1 tablets (25-50 mg total) by mouth  at bedtime as needed for sleep. 30 tablet 3   No current facility-administered medications for this visit.    Allergies  Allergen Reactions  . Amiodarone Other (See Comments)    adverse reaction: Tremor/gait disurbance  . Albuterol Palpitations    Palpitations, intolerance  . Atorvastatin Other (See Comments)    Body aches and pains--stiffness.   [name] [Pitavastatin]     Body aches  . Propoxyphene N-Acetaminophen Nausea And Vomiting    Review of Systems negative except from HPI and PMH  Physical Exam   BP 126/82   Pulse (!) 122   Ht '5\' 3"'  (1.6 m)   Wt 291 lb (132 kg)   SpO2 98%   BMI 51.55 kg/m  Well developed and nourished in no acute distress HENT normal Neck supple with JVP  Clear Regular rate and rhythm, no murmurs or gallops Abd-soft with active BS No Clubbing cyanosis 1+edema Skin-warm and dry A & Oriented  Grossly normal sensory and motor function  ECG atrial fibrillation at 122 Intervals-/10/34    Assessment and  Plan  Hypertension     Atrial fibrillation-persistent    Cardiomyopathy non ischemic resolved intercurrently  Congestive heart failure acute/chronic class 3  Sarcoid  Obesity - morbid   Tremor   Euthyroid      Patient with worsening heart failure in the context of currently identified atrial fibrillation with a rapid ventricular response.  Pulse ox is unfortunately unable to detect her atrial fibrillation so have reviewed with her the ability to take her carotid pulse.  We will undertake cardioversion.  Hopefully taking her own pulse she will be able to determine sinus versus nonsinus rhythm.  May need antiarrhythmic therapy to maintain sinus rhythm.  Hopefully  restoration of sinus rhythm will obviate the need for augmented diuretics.  Worried a little bit about the impact of rapid rates on her cardiomyopathy.  We will plan an echocardiogram about 6 weeks following cardioversion.

## 2019-12-25 NOTE — Patient Instructions (Addendum)
Medication Instructions Your physician recommends that you continue on your current medications as directed. Please refer to the Current Medication list given to you today.    IIf you need a refill on your cardiac medications before your next appointment, please call your pharmacy*   Lab Work: CBC and BMET - 12/29/2019 If you have labs (blood work) drawn today and your tests are completely normal, you will receive your results only by: Marland Kitchen MyChart Message (if you have MyChart) OR . A paper copy in the mail If you have any lab test that is abnormal or we need to change your treatment, we will call you to review the results.   Testing/Procedures:  Hospital doctor cardioversion is the delivery of a jolt of electricity to restore a normal rhythm to the heart. A rhythm that is too fast or is not regular keeps the heart from pumping well. In this procedure, sticky patches or metal paddles are placed on the chest to deliver electricity to the heart from a device. This procedure may be done in an emergency if:  There is low or no blood pressure as a result of the heart rhythm.  Normal rhythm must be restored as fast as possible to protect the brain and heart from further damage.  It may save a life. This may also be a scheduled procedure for irregular or fast heart rhythms that are not immediately life-threatening. Tell a health care provider about:  Any allergies you have.  All medicines you are taking, including vitamins, herbs, eye drops, creams, and over-the-counter medicines.  Any problems you or family members have had with anesthetic medicines.  Any blood disorders you have.  Any surgeries you have had.  Any medical conditions you have.  Whether you are pregnant or may be pregnant. What are the risks? Generally, this is a safe procedure. However, problems may occur, including:  Allergic reactions to medicines.  A blood clot that breaks free and travels  to other parts of your body.  The possible return of an abnormal heart rhythm within hours or days after the procedure.  Your heart stopping (cardiac arrest). This is rare. What happens before the procedure? Medicines  Your health care provider may have you start taking: ? Blood-thinning medicines (anticoagulants) so your blood does not clot as easily. ? Medicines to help stabilize your heart rate and rhythm.  Ask your health care provider about: ? Changing or stopping your regular medicines. This is especially important if you are taking diabetes medicines or blood thinners. ? Taking medicines such as aspirin and ibuprofen. These medicines can thin your blood. Do not take these medicines unless your health care provider tells you to take them. ? Taking over-the-counter medicines, vitamins, herbs, and supplements. General instructions  Follow instructions from your health care provider about eating or drinking restrictions.  Plan to have someone take you home from the hospital or clinic.  If you will be going home right after the procedure, plan to have someone with you for 24 hours.  Ask your health care provider what steps will be taken to help prevent infection. These may include washing your skin with a germ-killing soap. What happens during the procedure?   An IV will be inserted into one of your veins.  Sticky patches (electrodes) or metal paddles may be placed on your chest.  You will be given a medicine to help you relax (sedative).  An electrical shock will be delivered. The procedure may vary among health care  providers and hospitals. What can I expect after the procedure?  Your blood pressure, heart rate, breathing rate, and blood oxygen level will be monitored until you leave the hospital or clinic.  Your heart rhythm will be watched to make sure it does not change.  You may have some redness on the skin where the shocks were given. Follow these instructions at  home:  Do not drive for 24 hours if you were given a sedative during your procedure.  Take over-the-counter and prescription medicines only as told by your health care provider.  Ask your health care provider how to check your pulse. Check it often.  Rest for 48 hours after the procedure or as told by your health care provider.  Avoid or limit your caffeine use as told by your health care provider.  Keep all follow-up visits as told by your health care provider. This is important. Contact a health care provider if:  You feel like your heart is beating too quickly or your pulse is not regular.  You have a serious muscle cramp that does not go away. Get help right away if:  You have discomfort in your chest.  You are dizzy or you feel faint.  You have trouble breathing or you are short of breath.  Your speech is slurred.  You have trouble moving an arm or leg on one side of your body.  Your fingers or toes turn cold or blue. Summary  Electrical cardioversion is the delivery of a jolt of electricity to restore a normal rhythm to the heart.  This procedure may be done right away in an emergency or may be a scheduled procedure if the condition is not an emergency.  Generally, this is a safe procedure.  After the procedure, check your pulse often as told by your health care provider. This information is not intended to replace advice given to you by your health care provider. Make sure you discuss any questions you have with your health care provider. Document Revised: 05/08/2019 Document Reviewed: 05/08/2019 Elsevier Patient Education  [place]: At [facility], you and your health needs are our priority.  As part of our continuing mission to provide you with exceptional heart care, we have created designated Provider Care Teams.  These Care Teams include your primary Cardiologist (physician) and Advanced Practice Providers (APPs -  Physician  Assistants and Nurse Practitioners) who all work together to provide you with the care you need, when you need it.  We recommend signing up for the patient portal called "MyChart".  Sign up information is provided on this After Visit Summary.  MyChart is used to connect with patients for Virtual Visits (Telemedicine).  Patients are able to view lab/test results, encounter notes, upcoming appointments, etc.  Non-urgent messages can be sent to your provider as well.   To learn more about what you can do with MyChart, go to [email or web address].    Your next appointment:   As scheduled with Dr Oval Linsey  The format for your next appointment:   In person  Provider:   Dr Oval Linsey   Other Instructions  Dear Stephanie [name],  You are scheduled for a TEE/Cardioversion/TEE Cardioversion on 01/02/2020 with Dr. Oval Linsey.  Please arrive at the [facility] (Main Entrance A) at [facility]: [address] [place], [place] [address] at 9am.   DIET: Nothing to eat or drink after midnight except a sip of water with medications (see medication instructions below)  Medication Instructions: Hold your morning Diabetes (Metformin and any Insulin) medications and take 1/2 your usual dose of insulin the night before your procedure.  Continue your anticoagulant:Pradaxa You will need to continue your anticoagulant after your procedure until you  are told by your  Provider that it is safe to stop   Labs: CBC and BMET - [employer] office  Your Pre-procedure COVID-19 Testing will be done on 12/29/2019 at 1245pm. at [facility] at [id number], [place], [place] [address]. Once you arrive at the testing site, stay in the right hand lane, go under the building overhang not the tent. If you are tested under the tent your results may not be back before your procedure. Please be on time for your appointment.  After your swab you will be given a mask to wear and instructed to  go home and quarantine/no visitors until after your procedure. If you test positive you will be notified and your procedure will be cancelled.       You must have a responsible person to drive you home and stay in the waiting area during your procedure. Failure to do so could result in cancellation.  Bring your insurance cards.  *Special Note: Every effort is made to have your procedure done on time. Occasionally there are emergencies that occur at the hospital that may cause delays. Please be patient if a delay does occur.    Marland Kitchen

## 2019-12-26 DIAGNOSIS — I10 Essential (primary) hypertension: Secondary | ICD-10-CM | POA: Diagnosis not present

## 2019-12-26 DIAGNOSIS — E669 Obesity, unspecified: Secondary | ICD-10-CM | POA: Diagnosis not present

## 2019-12-26 DIAGNOSIS — E1165 Type 2 diabetes mellitus with hyperglycemia: Secondary | ICD-10-CM | POA: Diagnosis not present

## 2019-12-29 ENCOUNTER — Other Ambulatory Visit: Payer: Self-pay

## 2019-12-29 ENCOUNTER — Other Ambulatory Visit: Payer: Medicare Other | Admitting: *Deleted

## 2019-12-29 ENCOUNTER — Other Ambulatory Visit (HOSPITAL_COMMUNITY)
Admission: RE | Admit: 2019-12-29 | Discharge: 2019-12-29 | Disposition: A | Discharge status: Home / Self Care | Payer: Medicare Other | Source: Ambulatory Visit | Attending: Cardiovascular Disease | Admitting: Cardiovascular Disease

## 2019-12-29 DIAGNOSIS — Z01812 Encounter for preprocedural laboratory examination: Secondary | ICD-10-CM

## 2019-12-29 DIAGNOSIS — I5032 Chronic diastolic (congestive) heart failure: Secondary | ICD-10-CM | POA: Diagnosis not present

## 2019-12-29 DIAGNOSIS — I4811 Longstanding persistent atrial fibrillation: Secondary | ICD-10-CM

## 2019-12-29 DIAGNOSIS — I428 Other cardiomyopathies: Secondary | ICD-10-CM | POA: Diagnosis not present

## 2019-12-29 DIAGNOSIS — Z20822 Contact with and (suspected) exposure to covid-19: Secondary | ICD-10-CM | POA: Insufficient documentation

## 2019-12-30 LAB — BASIC METABOLIC PANEL
BUN/Creatinine Ratio: 10 — ABNORMAL LOW (ref 12–28)
BUN: 11 mg/dL (ref 8–27)
CO2: 27 mmol/L (ref 20–29)
Calcium: 9.5 mg/dL (ref 8.7–10.3)
Chloride: 100 mmol/L (ref 96–106)
Creatinine, Ser: 1.09 mg/dL — ABNORMAL HIGH (ref 0.57–1.00)
GFR calc Af Amer: 61 mL/min/{1.73_m2} (ref >59)
GFR calc non Af Amer: 53 mL/min/{1.73_m2} — ABNORMAL LOW (ref >59)
Glucose: 257 mg/dL — ABNORMAL HIGH (ref 65–99)
Potassium: 3.7 mmol/L (ref 3.5–5.2)
Sodium: 143 mmol/L (ref 134–144)

## 2019-12-30 LAB — CBC
Hematocrit: 34.5 % (ref 34.0–46.6)
Hemoglobin: 10.9 g/dL — ABNORMAL LOW (ref 11.1–15.9)
MCH: 30.2 pg (ref 26.6–33.0)
MCHC: 31.6 g/dL (ref 31.5–35.7)
MCV: 96 fL (ref 79–97)
Platelets: 205 10*3/uL (ref 150–450)
RBC: 3.61 x10E6/uL — ABNORMAL LOW (ref 3.77–5.28)
RDW: 12.3 % (ref 11.7–15.4)
WBC: 7.1 10*3/uL (ref 3.4–10.8)

## 2019-12-30 LAB — SARS CORONAVIRUS 2 (TAT 6-24 HRS): SARS Coronavirus 2: NEGATIVE

## 2020-01-01 ENCOUNTER — Telehealth: Payer: Self-pay

## 2020-01-01 NOTE — Telephone Encounter (Signed)
-----   Message from Deboraha Sprang, MD sent at 12/31/2019  1:19 PM EDT ----- Please Inform Patient  Labs are normal x #mild anemia which is relatively stable Kidney function is better than it has been   Thanks

## 2020-01-01 NOTE — Telephone Encounter (Signed)
Call to patient to discuss lab results per Dr. [name].   No answer, LMTCB.

## 2020-01-02 ENCOUNTER — Other Ambulatory Visit: Payer: Self-pay

## 2020-01-02 ENCOUNTER — Ambulatory Visit (HOSPITAL_COMMUNITY): Payer: Medicare Other | Admitting: Critical Care Medicine

## 2020-01-02 ENCOUNTER — Ambulatory Visit (HOSPITAL_COMMUNITY)
Admission: RE | Admit: 2020-01-02 | Discharge: 2020-01-02 | Disposition: A | Discharge status: Home / Self Care | Payer: Medicare Other | Attending: Cardiovascular Disease | Admitting: Cardiovascular Disease

## 2020-01-02 ENCOUNTER — Encounter (HOSPITAL_COMMUNITY)
Admission: RE | Disposition: A | Discharge status: Home / Self Care | Payer: Self-pay | Source: Home / Self Care | Attending: Cardiovascular Disease

## 2020-01-02 ENCOUNTER — Encounter (HOSPITAL_COMMUNITY): Payer: Self-pay | Admitting: Cardiovascular Disease

## 2020-01-02 DIAGNOSIS — Z79899 Other long term (current) drug therapy: Secondary | ICD-10-CM | POA: Insufficient documentation

## 2020-01-02 DIAGNOSIS — F329 Major depressive disorder, single episode, unspecified: Secondary | ICD-10-CM | POA: Diagnosis not present

## 2020-01-02 DIAGNOSIS — E785 Hyperlipidemia, unspecified: Secondary | ICD-10-CM | POA: Diagnosis not present

## 2020-01-02 DIAGNOSIS — I5032 Chronic diastolic (congestive) heart failure: Secondary | ICD-10-CM | POA: Insufficient documentation

## 2020-01-02 DIAGNOSIS — I4819 Other persistent atrial fibrillation: Secondary | ICD-10-CM | POA: Diagnosis not present

## 2020-01-02 DIAGNOSIS — D869 Sarcoidosis, unspecified: Secondary | ICD-10-CM | POA: Diagnosis not present

## 2020-01-02 DIAGNOSIS — I110 Hypertensive heart disease with heart failure: Secondary | ICD-10-CM | POA: Diagnosis not present

## 2020-01-02 DIAGNOSIS — Z888 Allergy status to other drugs, medicaments and biological substances status: Secondary | ICD-10-CM | POA: Insufficient documentation

## 2020-01-02 DIAGNOSIS — Z6841 Body Mass Index (BMI) 40.0 and over, adult: Secondary | ICD-10-CM | POA: Diagnosis not present

## 2020-01-02 DIAGNOSIS — M109 Gout, unspecified: Secondary | ICD-10-CM | POA: Diagnosis not present

## 2020-01-02 DIAGNOSIS — Z7901 Long term (current) use of anticoagulants: Secondary | ICD-10-CM | POA: Insufficient documentation

## 2020-01-02 DIAGNOSIS — I428 Other cardiomyopathies: Secondary | ICD-10-CM | POA: Diagnosis not present

## 2020-01-02 DIAGNOSIS — I2510 Atherosclerotic heart disease of native coronary artery without angina pectoris: Secondary | ICD-10-CM | POA: Insufficient documentation

## 2020-01-02 DIAGNOSIS — E119 Type 2 diabetes mellitus without complications: Secondary | ICD-10-CM | POA: Diagnosis not present

## 2020-01-02 DIAGNOSIS — Z794 Long term (current) use of insulin: Secondary | ICD-10-CM | POA: Insufficient documentation

## 2020-01-02 DIAGNOSIS — J45909 Unspecified asthma, uncomplicated: Secondary | ICD-10-CM | POA: Insufficient documentation

## 2020-01-02 LAB — GLUCOSE, CAPILLARY: Glucose-Capillary: 191 mg/dL — ABNORMAL HIGH (ref 70–99)

## 2020-01-02 SURGERY — CANCELLED PROCEDURE

## 2020-01-02 MED ORDER — SODIUM CHLORIDE 0.9 % IV SOLN: INTRAVENOUS | Status: DC

## 2020-01-02 NOTE — CV Procedure (Signed)
Patient presented for cardioversion and was in sinus rhythm with frequent PACs.  Cardioversion did not occur.  Stephanie Gengler C. Oval Linsey, MD, [facility] 01/02/2020 10:02 AM

## 2020-01-02 NOTE — Interval H&P Note (Signed)
History and Physical Interval Note:  01/02/2020 9:30 AM  Stephanie [name]  has presented today for surgery, with the diagnosis of AFIB.  The various methods of treatment have been discussed with the patient and family. After consideration of risks, benefits and other options for treatment, the patient has consented to  Procedure(s): CARDIOVERSION (N/A) as a surgical intervention.  The patient's history has been reviewed, patient examined, no change in status, stable for surgery.  I have reviewed the patient's chart and labs.  Questions were answered to the patient's satisfaction.     Skeet Latch, MD

## 2020-01-02 NOTE — Telephone Encounter (Signed)
Spoke to patient about results. No further orders at this time.   Advised pt to call for any further questions or concerns.

## 2020-01-02 NOTE — Progress Notes (Signed)
Pt presented for cardioversion in sinus rhythm with frequent PACs. Dr. Oval Linsey notified and confirmed with EKG. Verbal order given to send patient home.

## 2020-01-14 DIAGNOSIS — Z23 Encounter for immunization: Secondary | ICD-10-CM | POA: Diagnosis not present

## 2020-01-30 ENCOUNTER — Encounter: Payer: Self-pay | Admitting: Student

## 2020-01-30 ENCOUNTER — Ambulatory Visit (INDEPENDENT_AMBULATORY_CARE_PROVIDER_SITE_OTHER): Payer: Medicare Other | Admitting: Student

## 2020-01-30 ENCOUNTER — Other Ambulatory Visit: Payer: Self-pay

## 2020-01-30 VITALS — BP 128/70 | HR 98 | Ht 63.0 in | Wt 283.0 lb

## 2020-01-30 DIAGNOSIS — I5032 Chronic diastolic (congestive) heart failure: Secondary | ICD-10-CM

## 2020-01-30 DIAGNOSIS — I428 Other cardiomyopathies: Secondary | ICD-10-CM | POA: Diagnosis not present

## 2020-01-30 DIAGNOSIS — I4811 Longstanding persistent atrial fibrillation: Secondary | ICD-10-CM | POA: Diagnosis not present

## 2020-01-30 NOTE — Progress Notes (Signed)
PCP:  Delrae Rend, MD Electrophysiologist: [name], MD   Stephanie [name] is a 69 y.o. female with past medical history of persistent AF, HTN, sarcoid, NICM, NYHA III symptoms, and obesity who presents today for routine electrophysiology followup. They are seen for Dr. [name]. Seen in March with recurrent AF with RVR and scheduled for DCCV. Was at [facility] at presentation for DCCV.    Since last being seen in our clinic, the patient reports doing fatigue over the past 1.5 - 2 weeks. Prior to that, she was out of her coreg for about a week.   She misses her medications frequently.  For her BID medications; Pradaxa, Coreg, and Entresto, she will skip or forget the second dose 3-4 times a week. No specific reason other than forgetting or "falling asleep". She denies chest pain, syncope, lightheadedness, dizziness, or edema.  Past Medical History:  Diagnosis Date  . Anemia   . Ankle fracture, right    x 2  . Antineoplastic and immunosuppressive drugs causing adverse effect in therapeutic use   . Asthma   . Chronic venous hypertension without complications   . Colon polyps 2009  . Coronary artery disease    no CAD by cath 11.2010  . Depressive disorder, not elsewhere classified   . Diabetes mellitus type 2 in obese (HCC)    hgb AIC 6.1 09/02/2009, insulin dependent   . Diastolic CHF, chronic (HCC)    reports stable  . Diverticulosis of colon 2009   colonoscopy  . Gout    cortisone  shots helped  . Hepatitis, unspecified    hx of/per pt, never was told of having hepatitis  . Hx of hysterectomy 2002  . Hyperlipidemia   . Hypertension    controlled on medication   . Hypothyroidism    reports her thryoid levels are normal now   . Irritable bowel syndrome   . Lung nodule    8m RLL nodule on CT Chest  07/2009, resolved by 2011 CT  . Noncompliance   . Obesity   . Persistent atrial fibrillation ([place]    s/p DCCV 07/2009; Pradaxa Rx, states she is in sinus rhythm now   . PONV  (postoperative nausea and vomiting)    after receiving "too much anesthesia" after a hernia surgery   . Sarcoidosis    dx by eye exam; seen during eye exam , report asymtptomatic "i dont have it now'   . Sleep disturbance 06/2013   sleep study, mild abnormality, no criteria for CPAP   Past Surgical History:  Procedure Laterality Date  . ABDOMINAL HYSTERECTOMY    . CARDIOVERSION     x 2  . CARDIOVERSION N/A 02/27/2013   Procedure: CARDIOVERSION;  Surgeon: BLelon Perla MD;  [facility];  Service: Cardiovascular;  Laterality: N/A;  . CARDIOVERSION N/A 03/24/2017   Procedure: CARDIOVERSION;  Surgeon: NJosue Hector MD;  [facility];  Service: Cardiovascular;  Laterality: N/A;  . CHOLECYSTECTOMY  early 830s . COLONOSCOPY     Diverticulosis, colon polyps, repeat 2014, Dr. KDeatra Ina . COLONOSCOPY WITH PROPOFOL N/A 07/25/2019   Procedure: COLONOSCOPY WITH PROPOFOL;  Surgeon: AYetta Flock MD;  Location: [facility] ENDOSCOPY;  Service: Gastroenterology;  Laterality: N/A;  . ESOPHAGOGASTRODUODENOSCOPY N/A 05/16/2014   Procedure: ESOPHAGOGASTRODUODENOSCOPY (EGD);  Surgeon: SWonda Horner MD;  Location: [facility] ORS;  Service: Gastroenterology;  Laterality: N/A;  . HERNIA REPAIR  20258  umbilical hernia  . MYOMECTOMY  2000  . POLYPECTOMY  07/25/2019   Procedure: POLYPECTOMY;  Surgeon: Yetta Flock, MD;  Location: [facility];  Service: Gastroenterology;;    Current Outpatient Medications  Medication Sig Dispense Refill  . carvedilol (COREG) 25 MG tablet TAKE 1 TABLET(25 MG) BY MOUTH TWICE DAILY (Patient taking differently: Take 25 mg by mouth 2 (two) times daily with a meal. ) 180 tablet 3  . cholecalciferol (VITAMIN D3) 25 MCG (1000 UNIT) tablet Take 1,000 Units by mouth daily.    . colchicine 0.6 MG tablet TAKE 1 TABLET BY MOUTH EVERY DAY AS NEEDED(GOUT) 30 tablet 0  . ENTRESTO 24-26 MG TAKE 1 TABLET BY MOUTH TWICE DAILY (Patient taking differently: Take 1 tablet by  mouth 2 (two) times daily. ) 60 tablet 11  . furosemide (LASIX) 40 MG tablet Take 1.5 tablets (60 mg total) by mouth daily. Weight daily and take additional lasix 40 mg for weight gain of 2-3 pounds (Patient taking differently: Take 40 mg by mouth daily. Weight daily and take additional lasix 40 mg for weight gain of 2-3 pounds) 135 tablet 3  . hydrOXYzine (ATARAX/VISTARIL) 10 MG tablet Take 1 tablet (10 mg total) by mouth 3 (three) times daily as needed. (Patient taking differently: Take 10 mg by mouth at bedtime as needed (sleep). ) 30 tablet 0  . Insulin Pen Needle (NOVOFINE) 30G X 8 MM MISC For Humalog meal time TID 100 each 11  . JARDIANCE 10 MG TABS tablet Take 10 mg by mouth daily.    . metFORMIN (GLUCOPHAGE) 500 MG tablet Take 500 mg by mouth 2 (two) times daily.     . Multiple Vitamins-Minerals (ECHINACEA ACZ PO) Take 1 tablet by mouth daily.    Marland Kitchen NOVOLOG FLEXPEN 100 UNIT/ML FlexPen Inject 15 Units into the skin 2 (two) times daily.     . ondansetron (ZOFRAN-ODT) 4 MG disintegrating tablet Take 1 tablet (4 mg total) by mouth every 6 (six) hours as needed for nausea or vomiting. 60 tablet 1  . polyethylene glycol (MIRALAX) 17 g packet Take 17 g by mouth daily as needed. 14 each 0  . PRADAXA 150 MG CAPS capsule TAKE 1 CAPSULE BY MOUTH TWICE DAILY (Patient taking differently: Take 150 mg by mouth 2 (two) times daily. ) 60 capsule 5  . simvastatin (ZOCOR) 10 MG tablet Take 10 mg by mouth daily.     Marland Kitchen spironolactone (ALDACTONE) 25 MG tablet Take 1 tablet (25 mg total) by mouth daily. 90 tablet 3  . SUPREP BOWEL PREP KIT 17.5-3.13-1.6 GM/177ML SOLN Suprep-Use as directed 354 mL 0  . traZODone (DESYREL) 50 MG tablet Take 0.5-1 tablets (25-50 mg total) by mouth at bedtime as needed for sleep. 30 tablet 3   No current facility-administered medications for this visit.    Allergies  Allergen Reactions  . Amiodarone Other (See Comments)    adverse reaction: Tremor/gait disurbance  . Albuterol  Palpitations    Palpitations, intolerance  . Atorvastatin Other (See Comments)    Body aches and pains--stiffness.   [name] [Pitavastatin]     Body aches  . Propoxyphene N-Acetaminophen Nausea And Vomiting    Social History   Socioeconomic History  . Marital status: Single    Spouse name: Not on file  . Number of children: 5  . Years of education: college  . Highest education level: Not on file  Occupational History  . Occupation: Disabled.  Tobacco Use  . Smoking status: Never Smoker  . Smokeless tobacco: Never Used  Substance and Sexual  Activity  . Alcohol use: Yes    Alcohol/week: 0.0 standard drinks    Comment: rare  . Drug use: No  . Sexual activity: Not Currently    Birth control/protection: Surgical  Other Topics Concern  . Not on file  Social History Narrative   Pt lives in [place] with her son.  Unemployed.  Right handed.     Education college   Social Determinants of Radio broadcast assistant Strain:   . Difficulty of Paying Living Expenses:   Food Insecurity:   . Worried About Charity fundraiser in the Last Year:   . Arboriculturist in the Last Year:   Transportation Needs:   . Film/video editor (Medical):   Marland Kitchen Lack of Transportation (Non-Medical):   Physical Activity:   . Days of Exercise per Week:   . Minutes of Exercise per Session:   Stress:   . Feeling of Stress :   Social Connections:   . Frequency of Communication with Friends and Family:   . Frequency of Social Gatherings with Friends and Family:   . Attends Religious Services:   . Active Member of Clubs or Organizations:   . Attends Archivist Meetings:   Marland Kitchen Marital Status:   Intimate Partner Violence:   . Fear of Current or Ex-Partner:   . Emotionally Abused:   Marland Kitchen Physically Abused:   . Sexually Abused:      Review of Systems: All other systems reviewed and are otherwise negative except as noted above.  Physical Exam: Vitals:   01/30/20 1151  BP: 128/70    Pulse: 98  SpO2: 98%  Weight: 283 lb (128.4 kg)  Height: '5\' 3"'  (1.6 m)    GEN- The patient is well appearing, alert and oriented x 3 today.   HEENT: normocephalic, atraumatic; sclera clear, conjunctiva pink; hearing intact; oropharynx clear; neck supple, no JVP Lymph- no cervical lymphadenopathy Lungs- Clear to ausculation bilaterally, normal work of breathing.  No wheezes, rales, rhonchi Heart- Regular rate and rhythm, no murmurs, rubs or gallops, PMI not laterally displaced GI- soft, non-tender, non-distended, bowel sounds present, no hepatosplenomegaly Extremities- no clubbing, cyanosis, or edema; DP/PT/radial pulses 2+ bilaterally MS- no significant deformity or atrophy Skin- warm and dry, no rash or lesion Psych- euthymic mood, full affect Neuro- strength and sensation are intact  EKG is ordered. Personal review of EKG from today shows atypical appearing atrial flutter vs coarse AF at 98 bpm.  Assessment and Plan:  1. Persistent atrial fibrillation/? flutter Continue on pradaxa for CHA2DS2VASC of at least 3   She is back out of rhythm today in setting of medication non-compliance.  Discussed DCCV, but she missed pradaxa as recently as yesterday and pt refuses TEE consideration.  Will thus plan strict med compliance over the next 3 weeks and follow up. If pt remains out of rhythm, would consider DCCV at that time.  She also missed ~1 week of coreg prior to these symptoms starting, so it is my home she will go back into NSR on her medications.   2. HTN Stable on current medication  3. NICM Echo 09/2018 LVEF 55-60% NYHA III in setting of  AF Continue coreg 25 mg BID Continue Entresto 24/26 mg BID Continue spironolactone 25 mg daily  4. HLD Continue statin  Encouraged strict med compliance and discussed risk of stroke with DCCV without TEE in setting of medical non compliance. Pt refused TEE at this time, thus will plan DCCV after  3-4 weeks of uninterrupted [place].    Shirley Friar, PA-C  01/30/20 2:05 PM

## 2020-01-30 NOTE — Patient Instructions (Signed)
Medication Instructions:  Carvedilol and Pradaxa take as Prescribed *If you need a refill on your cardiac medications before your next appointment, please call your pharmacy*   Lab Work: none If you have labs (blood work) drawn today and your tests are completely normal, you will receive your results only by: Marland Kitchen MyChart Message (if you have MyChart) OR . A paper copy in the mail If you have any lab test that is abnormal or we need to change your treatment, we will call you to review the results.   Testing/Procedures: none   Follow-Up: At [facility], you and your health needs are our priority.  As part of our continuing mission to provide you with exceptional heart care, we have created designated Provider Care Teams.  These Care Teams include your primary Cardiologist (physician) and Advanced Practice Providers (APPs -  Physician Assistants and Nurse Practitioners) who all work together to provide you with the care you need, when you need it.  We recommend signing up for the patient portal called "MyChart".  Sign up information is provided on this After Visit Summary.  MyChart is used to connect with patients for Virtual Visits (Telemedicine).  Patients are able to view lab/test results, encounter notes, upcoming appointments, etc.  Non-urgent messages can be sent to your provider as well.   To learn more about what you can do with MyChart, go to [email or web address].    Your next appointment:   3 week(s)  The format for your next appointment:   In Person  Provider:   Tommye Standard   Other Instructions

## 2020-02-25 NOTE — Progress Notes (Signed)
Cardiology Office Note Date:  02/25/2020  Patient ID:  Stephanie, [name] 11/10/1950, MRN [id number] PCP:  Delrae Rend, MD  Electrophysiologist: Dr. [name] Neuology: Dr. Brett Fairy Gastroenterology: Dr. Deatra Ina   Chief Complaint: planned follow up  History of Present Illness: Stephanie [name] is a 69 y.o. female with history of PAFib, sarcoidosis (eye), IBS, HLD, No obst CAD by cath in 2010, recurrent NICM felt 2/2 tachycardia/AF.   I saw her in 2017,  She was feeling much better off the Methimazole, she was pending referral to a new endocrinologist.  She denied any kind of CP, no overt palpitations, no dizziness, near syncope or syncope,  No SOB.  She had some edema reportedly better then the day prior and attributed this to not having been up and around to much.   She assured me she has not missed a single dose of her Pradaxa since her last visit a month ago, has been very religious about it.  She denies any bleeding or signs of bleeding.  She has been inadvertently taking 2 coreg tablets BID though.   She was noted to have recurrent reduction in her EF, she was in an AFib/flutter (90bpm) and planned for DCCV.  I saw her again Jan 2021 for Dr. [name], last seen by him July 2020, by symptoms had not thought she had been having any AF.  No changes were made Most recent echo Dec 2019, LVEF 55-60%  She was doing OK, very worried about COVID.  She has some brief palpitations, at longest and hour or 2, infrequently with no associated symptoms She mentioned heartburn of late She described a burning in the center of her chest when she drank soda especially on an empty stomach that just self resolves.  Not exertional or positional, no CP otherwise She did not exercise, but denies any CP, SOB/DOE or difficulties with her ADLs No dizzy spells, no near syncope or syncope No bleeding or signs of bleeding No changes were made  She saw Dr. [name] in March 2021, she was in Afib w/RVR, worsening SOB/CHF  symptoms planned for DCCV.  She arrived to the cardioversion in SR.  She saw A. Tillery, [place] 01/30/2020, again feeling poorly, discussed often missing her medicines, had been without her coreg for a week. Particularly as well, missing her PM doses of her BID meds, including her pradaxa. She was in AF (98bpm) She was urged get back on her medicines, discussed importance of strict medication compliance.  Planned for f/u, if on her medicines, consider DCCV.  TODAY She remains fatigued.   No CP, no rest SOB.  She is easily fatigued, has "no energy to do anything".  NO dizzy spells, near syncope or syncope. She explains that she was helping care for her cousin who was ding from cancer, during this she found it hard to keep up with her medicines.  He is now in Hospice and since her visit with [name] has been taking her medicines without fail.  IN specific, we discussed her Pradaxa and she is certain she has not missed a dose in the last 3+ weeks.  No bleeding or signs of bleeding      Her AF/AAD history: --DCCV 2010, May 2014 with raid return of AF then sponatenous conversion to SR --March 2015 discussed with Dr. Rayann Heman possible ablation but did not want to persue this, her amio was reduced to 253m daily and recommended lifestyle adjustments --Amiodarone ultimately stopped with concerns of possible toxicity with neuro symptoms of  staggering/tremor with improvement off as well as hyperthyroidism  -- CM felt to be AF/rate related 25% IN 2012, that is improved to 50-55% in 2014>> Dec 2017 again 25% in 2018 -- DCCV (x2 shocks) 03/24/2017 -- Mild abnormal sleep study no indication for CPAP 2014   Past Medical History:  Diagnosis Date  . Anemia   . Ankle fracture, right    x 2  . Antineoplastic and immunosuppressive drugs causing adverse effect in therapeutic use   . Asthma   . Chronic venous hypertension without complications   . Colon polyps 2009  . Coronary artery disease    no CAD by cath  11.2010  . Depressive disorder, not elsewhere classified   . Diabetes mellitus type 2 in obese (HCC)    hgb AIC 6.1 09/02/2009, insulin dependent   . Diastolic CHF, chronic (HCC)    reports stable  . Diverticulosis of colon 2009   colonoscopy  . Gout    cortisone  shots helped  . Hepatitis, unspecified    hx of/per pt, never was told of having hepatitis  . Hx of hysterectomy 2002  . Hyperlipidemia   . Hypertension    controlled on medication   . Hypothyroidism    reports her thryoid levels are normal now   . Irritable bowel syndrome   . Lung nodule    2m RLL nodule on CT Chest  07/2009, resolved by 2011 CT  . Noncompliance   . Obesity   . [place]    s/p DCCV 07/2009; Pradaxa Rx, states she is in sinus rhythm now   . PONV (postoperative nausea and vomiting)    after receiving "too much anesthesia" after a hernia surgery   . Sarcoidosis    dx by eye exam; seen during eye exam , report asymtptomatic "i dont have it now'   . Sleep disturbance 06/2013   sleep study, mild abnormality, no criteria for CPAP    Past Surgical History:  Procedure Laterality Date  . ABDOMINAL HYSTERECTOMY    . CARDIOVERSION     x 2  . CARDIOVERSION N/A 02/27/2013   Procedure: CARDIOVERSION;  Surgeon: BLelon Perla MD;  [facility];  Service: Cardiovascular;  Laterality: N/A;  . CARDIOVERSION N/A 03/24/2017   Procedure: CARDIOVERSION;  Surgeon: NJosue Hector MD;  [facility];  Service: Cardiovascular;  Laterality: N/A;  . CHOLECYSTECTOMY  early 864s . COLONOSCOPY     Diverticulosis, colon polyps, repeat 2014, Dr. KDeatra Ina . COLONOSCOPY WITH PROPOFOL N/A 07/25/2019   Procedure: COLONOSCOPY WITH PROPOFOL;  Surgeon: AYetta Flock MD;  Location: [facility] ENDOSCOPY;  Service: Gastroenterology;  Laterality: N/A;  . ESOPHAGOGASTRODUODENOSCOPY N/A 05/16/2014   Procedure: ESOPHAGOGASTRODUODENOSCOPY (EGD);  Surgeon: SWonda Horner MD;  Location: [facility]S;   Service: Gastroenterology;  Laterality: N/A;  . HERNIA REPAIR  26468  umbilical hernia  . MYOMECTOMY  2000  . POLYPECTOMY  07/25/2019   Procedure: POLYPECTOMY;  Surgeon: AYetta Flock MD;  [facility];  Service: Gastroenterology;;    Current Outpatient Medications  Medication Sig Dispense Refill  . carvedilol (COREG) 25 MG tablet TAKE 1 TABLET(25 MG) BY MOUTH TWICE DAILY (Patient taking differently: Take 25 mg by mouth 2 (two) times daily with a meal. ) 180 tablet 3  . cholecalciferol (VITAMIN D3) 25 MCG (1000 UNIT) tablet Take 1,000 Units by mouth daily.    . colchicine 0.6 MG tablet TAKE 1 TABLET BY MOUTH EVERY DAY AS NEEDED(GOUT) 30  tablet 0  . ENTRESTO 24-26 MG TAKE 1 TABLET BY MOUTH TWICE DAILY (Patient taking differently: Take 1 tablet by mouth 2 (two) times daily. ) 60 tablet 11  . furosemide (LASIX) 40 MG tablet Take 1.5 tablets (60 mg total) by mouth daily. Weight daily and take additional lasix 40 mg for weight gain of 2-3 pounds (Patient taking differently: Take 40 mg by mouth daily. Weight daily and take additional lasix 40 mg for weight gain of 2-3 pounds) 135 tablet 3  . hydrOXYzine (ATARAX/VISTARIL) 10 MG tablet Take 1 tablet (10 mg total) by mouth 3 (three) times daily as needed. (Patient taking differently: Take 10 mg by mouth at bedtime as needed (sleep). ) 30 tablet 0  . Insulin Pen Needle (NOVOFINE) 30G X 8 MM MISC For Humalog meal time TID 100 each 11  . JARDIANCE 10 MG TABS tablet Take 10 mg by mouth daily.    . metFORMIN (GLUCOPHAGE) 500 MG tablet Take 500 mg by mouth 2 (two) times daily.     . Multiple Vitamins-Minerals (ECHINACEA ACZ PO) Take 1 tablet by mouth daily.    Marland Kitchen NOVOLOG FLEXPEN 100 UNIT/ML FlexPen Inject 15 Units into the skin 2 (two) times daily.     . ondansetron (ZOFRAN-ODT) 4 MG disintegrating tablet Take 1 tablet (4 mg total) by mouth every 6 (six) hours as needed for nausea or vomiting. 60 tablet 1  . polyethylene glycol (MIRALAX) 17 g  packet Take 17 g by mouth daily as needed. 14 each 0  . PRADAXA 150 MG CAPS capsule TAKE 1 CAPSULE BY MOUTH TWICE DAILY (Patient taking differently: Take 150 mg by mouth 2 (two) times daily. ) 60 capsule 5  . simvastatin (ZOCOR) 10 MG tablet Take 10 mg by mouth daily.     Marland Kitchen spironolactone (ALDACTONE) 25 MG tablet Take 1 tablet (25 mg total) by mouth daily. 90 tablet 3  . SUPREP BOWEL PREP KIT 17.5-3.13-1.6 GM/177ML SOLN Suprep-Use as directed 354 mL 0  . traZODone (DESYREL) 50 MG tablet Take 0.5-1 tablets (25-50 mg total) by mouth at bedtime as needed for sleep. 30 tablet 3   No current facility-administered medications for this visit.    Allergies:   Amiodarone, Albuterol, Atorvastatin, Livalo [pitavastatin], and Propoxyphene n-acetaminophen   Social History:  The patient  reports that she has never smoked. She has never used smokeless tobacco. She reports current alcohol use. She reports that she does not use drugs.   Family History:  The patient's family history includes Cancer in her father; Emphysema in an other family member; Hypertension in her father; Multiple sclerosis in her mother; Pneumonia in her father.  ROS:  Please see the history of present illness.   All other systems are reviewed and otherwise negative.   PHYSICAL EXAM:  VS:  There were no vitals taken for this visit. BMI: There is no height or weight on file to calculate BMI. Very plesant BF, obese, in no acute distress  HEENT: normocephalic, atraumatic  Neck: no JVD, carotid bruits or masses Cardiac: irreg-irreg,  no significant murmurs, no rubs, or gallops Lungs: CTA b/l, no wheezing, rhonchi or rales  Abd: soft, nontender, obese MS: no deformity or atrophy Ext:  trace edema b/l LE  Skin: warm and dry, no rash Neuro:  No gross deficits appreciated Psych: euthymic mood, full affect     EKG:  Done today and reviewed by myself: AFib 95bpm, no ST changes  09/29/2018: TTE Study Conclusions - Left ventricle:  The  cavity size was normal. Wall thickness was   increased in a pattern of mild LVH. Systolic function was normal.   The estimated ejection fraction was in the range of 55% to 60%.   Wall motion was normal; there were no regional wall motion   abnormalities. Features are consistent with a pseudonormal left   ventricular filling pattern, with concomitant abnormal relaxation   and increased filling pressure (grade 2 diastolic dysfunction).  03/03/2018: TTE Study Conclusions - Left ventricle: The cavity size was normal. There was moderate   concentric hypertrophy. Systolic function was severely reduced.   The estimated ejection fraction was in the range of 15% to 20%.   Diffuse hypokinesis. - Aortic valve: There was no regurgitation. - Aortic root: The aortic root was normal in size. - Mitral valve: There was mild regurgitation. - Left atrium: The atrium was moderately dilated. - Right ventricle: Systolic function was normal. - Right atrium: The atrium was mildly dilated. - Tricuspid valve: There was mild regurgitation. - Pulmonary arteries: Systolic pressure was within the normal   range. - Inferior vena cava: The vessel was normal in size. - Pericardium, extracardiac: There was no pericardial effusion.  Impressions: - Images with echocontrast show no thrombus in the left ventricle   but there is significant smoke.    10/18/16 TTE Study Conclusions - Left ventricle: The cavity size was mildly dilated. There was   moderate concentric hypertrophy. Systolic function was severely   reduced. The estimated ejection fraction was 25%. Diffuse   hypokinesis. The study is not technically sufficient to allow   evaluation of LV diastolic function. - Mitral valve: Calcified annulus. Mildly thickened leaflets .   There was mild regurgitation. - Left atrium: The atrium was severely dilated. (39m) - Right ventricle: Systolic function was moderately reduced. - Right atrium: The atrium was  mildly dilated. - Tricuspid valve: There was mild regurgitation.   05/29/13: Echocardiogram Study Conclusions Left ventricle: LVEF is approximately 50 to 55% with very mild inferior and septal hypokinesis  02/08/13: Echocardiogram Study Conclusions - Left ventricle: The cavity size was mildly dilated. Wall thickness was increased in a pattern of moderate LVH. Systolic function was severely reduced. The estimated ejection fraction was in the range of 25% to 30%. - Left atrium: The atrium was mildly dilated.  Recent Labs: 12/29/2019: BUN 11; Creatinine, Ser 1.09; Hemoglobin 10.9; Platelets 205; Potassium 3.7; Sodium 143  No results found for requested labs within last 8760 hours.   CrCl cannot be calculated (Patient's most recent lab result is older than the maximum 21 days allowed.).   Wt Readings from Last 3 Encounters:  01/30/20 283 lb (128.4 kg)  01/02/20 289 lb (131.1 kg)  12/25/19 291 lb (132 kg)     Other studies reviewed: Additional studies/records reviewed today include: summarized above  ASSESSMENT AND PLAN:  1. Persistent AFib     CHADS2Vasc is at least 3 on Pradaxa, appropriately dosed by last labs      She is certain of no missed Pradaxa doses in the last 3+ weeks. Will plan for DCCV at next available She understands the importance of taking her medicines and not missing any    2. HTN     No changes today     Discussed diet, exercise weight loss   3. NICM    Last echo looked better       On BB/Entresto, diuretic    No clear exam findings of volume OL currently  Disposition: Will see her back in a month, recheck on he BP, hopefully she will maintain SR post DCCV and be feeling better.    Current medicines are reviewed at length with the patient today.  The patient did not have any concerns regarding medicines.  Haywood Lasso, PA-C 02/25/2020 8:14 AM     [facility] [address] [place]  [id number] [phone] (office)  [phone] (fax)

## 2020-02-25 NOTE — H&P (View-Only) (Signed)
Cardiology Office Note Date:  02/25/2020  Patient ID:  Stephanie [name], Stephanie [name] 1951-05-02, MRN [id number] PCP:  Delrae Rend, MD  Electrophysiologist: Dr. [name] Neuology: Dr. Brett Fairy Gastroenterology: Dr. Deatra Ina   Chief Complaint: planned follow up  History of Present Illness: Stephanie [name] is a 69 y.o. female with history of PAFib, sarcoidosis (eye), IBS, HLD, No obst CAD by cath in 2010, recurrent NICM felt 2/2 tachycardia/AF.   I saw her in 2017,  She was feeling much better off the Methimazole, she was pending referral to a new endocrinologist.  She denied any kind of CP, no overt palpitations, no dizziness, near syncope or syncope,  No SOB.  She had some edema reportedly better then the day prior and attributed this to not having been up and around to much.   She assured me she has not missed a single dose of her Pradaxa since her last visit a month ago, has been very religious about it.  She denies any bleeding or signs of bleeding.  She has been inadvertently taking 2 coreg tablets BID though.   She was noted to have recurrent reduction in her EF, she was in an AFib/flutter (90bpm) and planned for DCCV.  I saw her again Jan 2021 for Dr. [name], last seen by him July 2020, by symptoms had not thought she had been having any AF.  No changes were made Most recent echo Dec 2019, LVEF 55-60%  She was doing OK, very worried about COVID.  She has some brief palpitations, at longest and hour or 2, infrequently with no associated symptoms She mentioned heartburn of late She described a burning in the center of her chest when she drank soda especially on an empty stomach that just self resolves.  Not exertional or positional, no CP otherwise She did not exercise, but denies any CP, SOB/DOE or difficulties with her ADLs No dizzy spells, no near syncope or syncope No bleeding or signs of bleeding No changes were made  She saw Dr. [name] in March 2021, she was in Afib w/RVR, worsening SOB/CHF  symptoms planned for DCCV.  She arrived to the cardioversion in SR.  She saw A. Tillery, [place] 01/30/2020, again feeling poorly, discussed often missing her medicines, had been without her coreg for a week. Particularly as well, missing her PM doses of her BID meds, including her pradaxa. She was in AF (98bpm) She was urged get back on her medicines, discussed importance of strict medication compliance.  Planned for f/u, if on her medicines, consider DCCV.  TODAY She remains fatigued.   No CP, no rest SOB.  She is easily fatigued, has "no energy to do anything".  NO dizzy spells, near syncope or syncope. She explains that she was helping care for her cousin who was ding from cancer, during this she found it hard to keep up with her medicines.  He is now in Hospice and since her visit with [name] has been taking her medicines without fail.  IN specific, we discussed her Pradaxa and she is certain she has not missed a dose in the last 3+ weeks.  No bleeding or signs of bleeding      Her AF/AAD history: --DCCV 2010, May 2014 with raid return of AF then sponatenous conversion to SR --March 2015 discussed with Dr. Rayann Heman possible ablation but did not want to persue this, her amio was reduced to 212m daily and recommended lifestyle adjustments --Amiodarone ultimately stopped with concerns of possible toxicity with neuro symptoms of  staggering/tremor with improvement off as well as hyperthyroidism  -- CM felt to be AF/rate related 25% IN 2012, that is improved to 50-55% in 2014>> Dec 2017 again 25% in 2018 -- DCCV (x2 shocks) 03/24/2017 -- Mild abnormal sleep study no indication for CPAP 2014   Past Medical History:  Diagnosis Date  . Anemia   . Ankle fracture, right    x 2  . Antineoplastic and immunosuppressive drugs causing adverse effect in therapeutic use   . Asthma   . Chronic venous hypertension without complications   . Colon polyps 2009  . Coronary artery disease    no CAD by cath  11.2010  . Depressive disorder, not elsewhere classified   . Diabetes mellitus type 2 in obese (HCC)    hgb AIC 6.1 09/02/2009, insulin dependent   . Diastolic CHF, chronic (HCC)    reports stable  . Diverticulosis of colon 2009   colonoscopy  . Gout    cortisone  shots helped  . Hepatitis, unspecified    hx of/per pt, never was told of having hepatitis  . Hx of hysterectomy 2002  . Hyperlipidemia   . Hypertension    controlled on medication   . Hypothyroidism    reports her thryoid levels are normal now   . Irritable bowel syndrome   . Lung nodule    67m RLL nodule on CT Chest  07/2009, resolved by 2011 CT  . Noncompliance   . Obesity   . Persistent atrial fibrillation ([place]    s/p DCCV 07/2009; Pradaxa Rx, states she is in sinus rhythm now   . PONV (postoperative nausea and vomiting)    after receiving "too much anesthesia" after a hernia surgery   . Sarcoidosis    dx by eye exam; seen during eye exam , report asymtptomatic "i dont have it now'   . Sleep disturbance 06/2013   sleep study, mild abnormality, no criteria for CPAP    Past Surgical History:  Procedure Laterality Date  . ABDOMINAL HYSTERECTOMY    . CARDIOVERSION     x 2  . CARDIOVERSION N/A 02/27/2013   Procedure: CARDIOVERSION;  Surgeon: BLelon Perla MD;  [facility];  Service: Cardiovascular;  Laterality: N/A;  . CARDIOVERSION N/A 03/24/2017   Procedure: CARDIOVERSION;  Surgeon: NJosue Hector MD;  [facility];  Service: Cardiovascular;  Laterality: N/A;  . CHOLECYSTECTOMY  early 860s . COLONOSCOPY     Diverticulosis, colon polyps, repeat 2014, Dr. KDeatra Ina . COLONOSCOPY WITH PROPOFOL N/A 07/25/2019   Procedure: COLONOSCOPY WITH PROPOFOL;  Surgeon: AYetta Flock MD;  Location: [facility];  Service: Gastroenterology;  Laterality: N/A;  . ESOPHAGOGASTRODUODENOSCOPY N/A 05/16/2014   Procedure: ESOPHAGOGASTRODUODENOSCOPY (EGD);  Surgeon: SWonda Horner MD;  Location: [facility];   Service: Gastroenterology;  Laterality: N/A;  . HERNIA REPAIR  25809  umbilical hernia  . MYOMECTOMY  2000  . POLYPECTOMY  07/25/2019   Procedure: POLYPECTOMY;  Surgeon: AYetta Flock MD;  [facility];  Service: Gastroenterology;;    Current Outpatient Medications  Medication Sig Dispense Refill  . carvedilol (COREG) 25 MG tablet TAKE 1 TABLET(25 MG) BY MOUTH TWICE DAILY (Patient taking differently: Take 25 mg by mouth 2 (two) times daily with a meal. ) 180 tablet 3  . cholecalciferol (VITAMIN D3) 25 MCG (1000 UNIT) tablet Take 1,000 Units by mouth daily.    . colchicine 0.6 MG tablet TAKE 1 TABLET BY MOUTH EVERY DAY AS NEEDED(GOUT) 30  tablet 0  . ENTRESTO 24-26 MG TAKE 1 TABLET BY MOUTH TWICE DAILY (Patient taking differently: Take 1 tablet by mouth 2 (two) times daily. ) 60 tablet 11  . furosemide (LASIX) 40 MG tablet Take 1.5 tablets (60 mg total) by mouth daily. Weight daily and take additional lasix 40 mg for weight gain of 2-3 pounds (Patient taking differently: Take 40 mg by mouth daily. Weight daily and take additional lasix 40 mg for weight gain of 2-3 pounds) 135 tablet 3  . hydrOXYzine (ATARAX/VISTARIL) 10 MG tablet Take 1 tablet (10 mg total) by mouth 3 (three) times daily as needed. (Patient taking differently: Take 10 mg by mouth at bedtime as needed (sleep). ) 30 tablet 0  . Insulin Pen Needle (NOVOFINE) 30G X 8 MM MISC For Humalog meal time TID 100 each 11  . JARDIANCE 10 MG TABS tablet Take 10 mg by mouth daily.    . metFORMIN (GLUCOPHAGE) 500 MG tablet Take 500 mg by mouth 2 (two) times daily.     . Multiple Vitamins-Minerals (ECHINACEA ACZ PO) Take 1 tablet by mouth daily.    Marland Kitchen NOVOLOG FLEXPEN 100 UNIT/ML FlexPen Inject 15 Units into the skin 2 (two) times daily.     . ondansetron (ZOFRAN-ODT) 4 MG disintegrating tablet Take 1 tablet (4 mg total) by mouth every 6 (six) hours as needed for nausea or vomiting. 60 tablet 1  . polyethylene glycol (MIRALAX) 17 g  packet Take 17 g by mouth daily as needed. 14 each 0  . PRADAXA 150 MG CAPS capsule TAKE 1 CAPSULE BY MOUTH TWICE DAILY (Patient taking differently: Take 150 mg by mouth 2 (two) times daily. ) 60 capsule 5  . simvastatin (ZOCOR) 10 MG tablet Take 10 mg by mouth daily.     Marland Kitchen spironolactone (ALDACTONE) 25 MG tablet Take 1 tablet (25 mg total) by mouth daily. 90 tablet 3  . SUPREP BOWEL PREP KIT 17.5-3.13-1.6 GM/177ML SOLN Suprep-Use as directed 354 mL 0  . traZODone (DESYREL) 50 MG tablet Take 0.5-1 tablets (25-50 mg total) by mouth at bedtime as needed for sleep. 30 tablet 3   No current facility-administered medications for this visit.    Allergies:   Amiodarone, Albuterol, Atorvastatin, Livalo [pitavastatin], and Propoxyphene n-acetaminophen   Social History:  The patient  reports that she has never smoked. She has never used smokeless tobacco. She reports current alcohol use. She reports that she does not use drugs.   Family History:  The patient's family history includes Cancer in her father; Emphysema in an other family member; Hypertension in her father; Multiple sclerosis in her mother; Pneumonia in her father.  ROS:  Please see the history of present illness.   All other systems are reviewed and otherwise negative.   PHYSICAL EXAM:  VS:  There were no vitals taken for this visit. BMI: There is no height or weight on file to calculate BMI. Very plesant BF, obese, in no acute distress  HEENT: normocephalic, atraumatic  Neck: no JVD, carotid bruits or masses Cardiac: irreg-irreg,  no significant murmurs, no rubs, or gallops Lungs: CTA b/l, no wheezing, rhonchi or rales  Abd: soft, nontender, obese MS: no deformity or atrophy Ext:  trace edema b/l LE  Skin: warm and dry, no rash Neuro:  No gross deficits appreciated Psych: euthymic mood, full affect     EKG:  Done today and reviewed by myself: AFib 95bpm, no ST changes  09/29/2018: TTE Study Conclusions - Left ventricle:  The  cavity size was normal. Wall thickness was   increased in a pattern of mild LVH. Systolic function was normal.   The estimated ejection fraction was in the range of 55% to 60%.   Wall motion was normal; there were no regional wall motion   abnormalities. Features are consistent with a pseudonormal left   ventricular filling pattern, with concomitant abnormal relaxation   and increased filling pressure (grade 2 diastolic dysfunction).  03/03/2018: TTE Study Conclusions - Left ventricle: The cavity size was normal. There was moderate   concentric hypertrophy. Systolic function was severely reduced.   The estimated ejection fraction was in the range of 15% to 20%.   Diffuse hypokinesis. - Aortic valve: There was no regurgitation. - Aortic root: The aortic root was normal in size. - Mitral valve: There was mild regurgitation. - Left atrium: The atrium was moderately dilated. - Right ventricle: Systolic function was normal. - Right atrium: The atrium was mildly dilated. - Tricuspid valve: There was mild regurgitation. - Pulmonary arteries: Systolic pressure was within the normal   range. - Inferior vena cava: The vessel was normal in size. - Pericardium, extracardiac: There was no pericardial effusion.  Impressions: - Images with echocontrast show no thrombus in the left ventricle   but there is significant smoke.    10/18/16 TTE Study Conclusions - Left ventricle: The cavity size was mildly dilated. There was   moderate concentric hypertrophy. Systolic function was severely   reduced. The estimated ejection fraction was 25%. Diffuse   hypokinesis. The study is not technically sufficient to allow   evaluation of LV diastolic function. - Mitral valve: Calcified annulus. Mildly thickened leaflets .   There was mild regurgitation. - Left atrium: The atrium was severely dilated. (63m) - Right ventricle: Systolic function was moderately reduced. - Right atrium: The atrium was  mildly dilated. - Tricuspid valve: There was mild regurgitation.   05/29/13: Echocardiogram Study Conclusions Left ventricle: LVEF is approximately 50 to 55% with very mild inferior and septal hypokinesis  02/08/13: Echocardiogram Study Conclusions - Left ventricle: The cavity size was mildly dilated. Wall thickness was increased in a pattern of moderate LVH. Systolic function was severely reduced. The estimated ejection fraction was in the range of 25% to 30%. - Left atrium: The atrium was mildly dilated.  Recent Labs: 12/29/2019: BUN 11; Creatinine, Ser 1.09; Hemoglobin 10.9; Platelets 205; Potassium 3.7; Sodium 143  No results found for requested labs within last 8760 hours.   CrCl cannot be calculated (Patient's most recent lab result is older than the maximum 21 days allowed.).   Wt Readings from Last 3 Encounters:  01/30/20 283 lb (128.4 kg)  01/02/20 289 lb (131.1 kg)  12/25/19 291 lb (132 kg)     Other studies reviewed: Additional studies/records reviewed today include: summarized above  ASSESSMENT AND PLAN:  1. Persistent AFib     CHADS2Vasc is at least 3 on Pradaxa, appropriately dosed by last labs      She is certain of no missed Pradaxa doses in the last 3+ weeks. Will plan for DCCV at next available She understands the importance of taking her medicines and not missing any    2. HTN     No changes today     Discussed diet, exercise weight loss   3. NICM    Last echo looked better       On BB/Entresto, diuretic    No clear exam findings of volume OL currently  Disposition: Will see her back in a month, recheck on he BP, hopefully she will maintain SR post DCCV and be feeling better.    Current medicines are reviewed at length with the patient today.  The patient did not have any concerns regarding medicines.  Haywood Lasso, PA-C 02/25/2020 8:14 AM     [facility] [place]  [phone] (office)  [phone] (fax)

## 2020-02-26 ENCOUNTER — Encounter: Payer: Self-pay | Admitting: *Deleted

## 2020-02-26 ENCOUNTER — Other Ambulatory Visit: Payer: Self-pay

## 2020-02-26 ENCOUNTER — Encounter (INDEPENDENT_AMBULATORY_CARE_PROVIDER_SITE_OTHER): Payer: Self-pay

## 2020-02-26 ENCOUNTER — Ambulatory Visit (INDEPENDENT_AMBULATORY_CARE_PROVIDER_SITE_OTHER): Payer: Medicare Other | Admitting: Physician Assistant

## 2020-02-26 VITALS — BP 164/70 | HR 88 | Ht 63.0 in | Wt 281.0 lb

## 2020-02-26 DIAGNOSIS — I428 Other cardiomyopathies: Secondary | ICD-10-CM | POA: Diagnosis not present

## 2020-02-26 DIAGNOSIS — I4811 Longstanding persistent atrial fibrillation: Secondary | ICD-10-CM | POA: Diagnosis not present

## 2020-02-26 DIAGNOSIS — I4819 Other persistent atrial fibrillation: Secondary | ICD-10-CM

## 2020-02-26 DIAGNOSIS — I10 Essential (primary) hypertension: Secondary | ICD-10-CM

## 2020-02-26 NOTE — Patient Instructions (Signed)
Medication Instructions:  Your physician recommends that you continue on your current medications as directed. Please refer to the Current Medication list given to you today.  *If you need a refill on your cardiac medications before your next appointment, please call your pharmacy*   Lab Work:  [place]   If you have labs (blood work) drawn today and your tests are completely normal, you will receive your results only by: Marland Kitchen MyChart Message (if you have MyChart) OR . A paper copy in the mail If you have any lab test that is abnormal or we need to change your treatment, we will call you to review the results.   Testing/Procedures: ON 03-04-20  Your physician has recommended that you have a Cardioversion (DCCV). Electrical Cardioversion uses a jolt of electricity to your heart either through paddles or wired patches attached to your chest. This is a controlled, usually prescheduled, procedure. Defibrillation is done under light anesthesia in the hospital, and you usually go home the day of the procedure. This is done to get your heart back into a normal rhythm. You are not awake for the procedure. Please see the instruction sheet given to you today.   Follow-Up: At [facility], you and your health needs are our priority.  As part of our continuing mission to provide you with exceptional heart care, we have created designated Provider Care Teams.  These Care Teams include your primary Cardiologist (physician) and Advanced Practice Providers (APPs -  Physician Assistants and Nurse Practitioners) who all work together to provide you with the care you need, when you need it.  We recommend signing up for the patient portal called "MyChart".  Sign up information is provided on this After Visit Summary.  MyChart is used to connect with patients for Virtual Visits (Telemedicine).  Patients are able to view lab/test results, encounter notes, upcoming appointments, etc.  Non-urgent messages can be  sent to your provider as well.   To learn more about what you can do with MyChart, go to [email or web address].    Your next appointment:   1 month(s)   AFTER 03-04-20   The format for your next appointment:   In Person  Provider:   You may see [name], MD or one of the following Advanced Practice Providers on your designated Care Team:    Chanetta Marshall, NP  Tommye Standard, [place]  Legrand Como "Oda Kilts, [place]    Other Instructions

## 2020-02-27 ENCOUNTER — Other Ambulatory Visit: Payer: Self-pay | Admitting: *Deleted

## 2020-02-27 DIAGNOSIS — I4819 Other persistent atrial fibrillation: Secondary | ICD-10-CM

## 2020-02-27 DIAGNOSIS — I5022 Chronic systolic (congestive) heart failure: Secondary | ICD-10-CM

## 2020-02-27 LAB — CBC
Hematocrit: 34.8 % (ref 34.0–46.6)
Hemoglobin: 11.2 g/dL (ref 11.1–15.9)
MCH: 30.4 pg (ref 26.6–33.0)
MCHC: 32.2 g/dL (ref 31.5–35.7)
MCV: 94 fL (ref 79–97)
Platelets: 250 10*3/uL (ref 150–450)
RBC: 3.69 x10E6/uL — ABNORMAL LOW (ref 3.77–5.28)
RDW: 13 % (ref 11.7–15.4)
WBC: 6 10*3/uL (ref 3.4–10.8)

## 2020-02-27 LAB — BASIC METABOLIC PANEL
BUN/Creatinine Ratio: 12 (ref 12–28)
BUN: 17 mg/dL (ref 8–27)
CO2: 21 mmol/L (ref 20–29)
Calcium: 9.8 mg/dL (ref 8.7–10.3)
Chloride: 100 mmol/L (ref 96–106)
Creatinine, Ser: 1.42 mg/dL — ABNORMAL HIGH (ref 0.57–1.00)
GFR calc Af Amer: 44 mL/min/{1.73_m2} — ABNORMAL LOW (ref >59)
GFR calc non Af Amer: 38 mL/min/{1.73_m2} — ABNORMAL LOW (ref >59)
Glucose: 242 mg/dL — ABNORMAL HIGH (ref 65–99)
Potassium: 5 mmol/L (ref 3.5–5.2)
Sodium: 139 mmol/L (ref 134–144)

## 2020-02-29 DIAGNOSIS — I10 Essential (primary) hypertension: Secondary | ICD-10-CM | POA: Diagnosis not present

## 2020-02-29 DIAGNOSIS — E1165 Type 2 diabetes mellitus with hyperglycemia: Secondary | ICD-10-CM | POA: Diagnosis not present

## 2020-02-29 DIAGNOSIS — E669 Obesity, unspecified: Secondary | ICD-10-CM | POA: Diagnosis not present

## 2020-03-01 ENCOUNTER — Other Ambulatory Visit (HOSPITAL_COMMUNITY)
Admission: RE | Admit: 2020-03-01 | Discharge: 2020-03-01 | Disposition: A | Discharge status: Home / Self Care | Payer: Medicare Other | Source: Ambulatory Visit | Attending: Cardiovascular Disease | Admitting: Cardiovascular Disease

## 2020-03-01 DIAGNOSIS — Z01812 Encounter for preprocedural laboratory examination: Secondary | ICD-10-CM | POA: Diagnosis not present

## 2020-03-01 DIAGNOSIS — Z20822 Contact with and (suspected) exposure to covid-19: Secondary | ICD-10-CM | POA: Diagnosis not present

## 2020-03-01 LAB — SARS CORONAVIRUS 2 (TAT 6-24 HRS): SARS Coronavirus 2: NEGATIVE

## 2020-03-01 NOTE — Progress Notes (Signed)
Spoke with patient re: appt. 03/04/20.  Reminded to quarantine through the weekend; pt does not report any symptoms. Reminded NPO, take Pradaxa as directed. Son will be bringing patient.

## 2020-03-04 ENCOUNTER — Encounter (HOSPITAL_COMMUNITY): Payer: Self-pay | Admitting: Cardiovascular Disease

## 2020-03-04 ENCOUNTER — Ambulatory Visit (HOSPITAL_COMMUNITY): Payer: Medicare Other | Admitting: Anesthesiology

## 2020-03-04 ENCOUNTER — Ambulatory Visit (HOSPITAL_COMMUNITY)
Admission: RE | Admit: 2020-03-04 | Discharge: 2020-03-04 | Disposition: A | Discharge status: Home / Self Care | Payer: Medicare Other | Attending: Cardiovascular Disease | Admitting: Cardiovascular Disease

## 2020-03-04 ENCOUNTER — Encounter (HOSPITAL_COMMUNITY)
Admission: RE | Disposition: A | Discharge status: Home / Self Care | Payer: Self-pay | Source: Home / Self Care | Attending: Cardiovascular Disease

## 2020-03-04 ENCOUNTER — Other Ambulatory Visit: Payer: Self-pay

## 2020-03-04 DIAGNOSIS — E119 Type 2 diabetes mellitus without complications: Secondary | ICD-10-CM | POA: Insufficient documentation

## 2020-03-04 DIAGNOSIS — J45909 Unspecified asthma, uncomplicated: Secondary | ICD-10-CM | POA: Insufficient documentation

## 2020-03-04 DIAGNOSIS — I428 Other cardiomyopathies: Secondary | ICD-10-CM | POA: Insufficient documentation

## 2020-03-04 DIAGNOSIS — I110 Hypertensive heart disease with heart failure: Secondary | ICD-10-CM | POA: Insufficient documentation

## 2020-03-04 DIAGNOSIS — Z794 Long term (current) use of insulin: Secondary | ICD-10-CM | POA: Insufficient documentation

## 2020-03-04 DIAGNOSIS — I2510 Atherosclerotic heart disease of native coronary artery without angina pectoris: Secondary | ICD-10-CM | POA: Insufficient documentation

## 2020-03-04 DIAGNOSIS — M109 Gout, unspecified: Secondary | ICD-10-CM | POA: Insufficient documentation

## 2020-03-04 DIAGNOSIS — I4819 Other persistent atrial fibrillation: Secondary | ICD-10-CM | POA: Insufficient documentation

## 2020-03-04 DIAGNOSIS — E785 Hyperlipidemia, unspecified: Secondary | ICD-10-CM | POA: Insufficient documentation

## 2020-03-04 DIAGNOSIS — I5032 Chronic diastolic (congestive) heart failure: Secondary | ICD-10-CM | POA: Diagnosis not present

## 2020-03-04 DIAGNOSIS — F329 Major depressive disorder, single episode, unspecified: Secondary | ICD-10-CM | POA: Insufficient documentation

## 2020-03-04 DIAGNOSIS — E039 Hypothyroidism, unspecified: Secondary | ICD-10-CM | POA: Insufficient documentation

## 2020-03-04 DIAGNOSIS — I4891 Unspecified atrial fibrillation: Secondary | ICD-10-CM | POA: Diagnosis not present

## 2020-03-04 DIAGNOSIS — Z7901 Long term (current) use of anticoagulants: Secondary | ICD-10-CM | POA: Insufficient documentation

## 2020-03-04 DIAGNOSIS — Z79899 Other long term (current) drug therapy: Secondary | ICD-10-CM | POA: Diagnosis not present

## 2020-03-04 DIAGNOSIS — K589 Irritable bowel syndrome without diarrhea: Secondary | ICD-10-CM | POA: Diagnosis not present

## 2020-03-04 DIAGNOSIS — E876 Hypokalemia: Secondary | ICD-10-CM | POA: Diagnosis not present

## 2020-03-04 DIAGNOSIS — E669 Obesity, unspecified: Secondary | ICD-10-CM | POA: Insufficient documentation

## 2020-03-04 HISTORY — PX: CARDIOVERSION: SHX1299

## 2020-03-04 LAB — GLUCOSE, CAPILLARY: Glucose-Capillary: 202 mg/dL — ABNORMAL HIGH (ref 70–99)

## 2020-03-04 SURGERY — CARDIOVERSION
Anesthesia: General

## 2020-03-04 MED ORDER — SODIUM CHLORIDE 0.9 % IV SOLN: INTRAVENOUS | Status: DC | PRN

## 2020-03-04 MED ORDER — PROPOFOL 10 MG/ML IV BOLUS
INTRAVENOUS | Status: DC | PRN
  Administered 2020-03-04: 100 mg via INTRAVENOUS

## 2020-03-04 MED ORDER — LIDOCAINE 2% (20 MG/ML) 5 ML SYRINGE
INTRAMUSCULAR | Status: DC | PRN
  Administered 2020-03-04: 60 mg via INTRAVENOUS

## 2020-03-04 NOTE — Anesthesia Postprocedure Evaluation (Signed)
Anesthesia Post Note  Patient: Stephanie [name]  Procedure(s) Performed: CARDIOVERSION (N/A )     Patient location during evaluation: Endoscopy Anesthesia Type: General Level of consciousness: awake and alert Pain management: pain level controlled Vital Signs Assessment: post-procedure vital signs reviewed and stable Respiratory status: spontaneous breathing, nonlabored ventilation and respiratory function stable Cardiovascular status: blood pressure returned to baseline and stable Postop Assessment: no apparent nausea or vomiting Anesthetic complications: no    Last Vitals:  Vitals:   03/04/20 1050 03/04/20 1059  BP: 129/85 115/75  Pulse: (!) 48 (!) 34  Resp: 16 16  Temp:    SpO2: 97% 97%    Last Pain:  Vitals:   03/04/20 1059  TempSrc:   PainSc: 0-No pain                 Shelton Soler,W. EDMOND

## 2020-03-04 NOTE — Anesthesia Procedure Notes (Signed)
Procedure Name: MAC Date/Time: 03/04/2020 10:21 AM Performed by: Leonor Liv, CRNA Pre-anesthesia Checklist: Patient identified, Emergency Drugs available, Suction available, Patient being monitored and Timeout performed Patient Re-evaluated:Patient Re-evaluated prior to induction Oxygen Delivery Method: Ambu bag Placement Confirmation: positive ETCO2 Dental Injury: Teeth and Oropharynx as per pre-operative assessment

## 2020-03-04 NOTE — Transfer of Care (Signed)
Immediate Anesthesia Transfer of Care Note  Patient: Stephanie [name]  Procedure(s) Performed: CARDIOVERSION (N/A )  Patient Location: Endoscopy Unit  Anesthesia Type:General  Level of Consciousness: awake, alert  and oriented  Airway & Oxygen Therapy: Patient Spontanous Breathing and Patient connected to nasal cannula oxygen  Post-op Assessment: Report given to RN, Post -op Vital signs reviewed and stable and Patient moving all extremities  Post vital signs: Reviewed and stable  Last Vitals:  Vitals Value Taken Time  BP 113/52 03/04/20 1034  Temp    Pulse 64 03/04/20 1036  Resp 15 03/04/20 1036  SpO2 100 % 03/04/20 1036  Vitals shown include unvalidated device data.  Last Pain:  Vitals:   03/04/20 1002  TempSrc: Oral  PainSc: 0-No pain         Complications: No apparent anesthesia complications

## 2020-03-04 NOTE — Anesthesia Preprocedure Evaluation (Signed)
Anesthesia Evaluation  Patient identified by MRN, date of birth, ID band Patient awake    Reviewed: Allergy & Precautions, H&P , NPO status , Patient's Chart, lab work & pertinent test results, reviewed documented beta blocker date and time   History of Anesthesia Complications (+) PONV  Airway Mallampati: II  TM Distance: >3 FB Neck ROM: Full    Dental no notable dental hx. (+) Teeth Intact, Dental Advisory Given   Pulmonary asthma ,    Pulmonary exam normal breath sounds clear to auscultation       Cardiovascular hypertension, Pt. on medications and Pt. on home beta blockers + CAD and +CHF  + dysrhythmias Atrial Fibrillation  Rhythm:Irregular Rate:Normal     Neuro/Psych Anxiety Depression negative neurological ROS     GI/Hepatic negative GI ROS, Neg liver ROS,   Endo/Other  diabetes, Insulin Dependent, Oral Hypoglycemic AgentsHypothyroidism Morbid obesity  Renal/GU negative Renal ROS  negative genitourinary   Musculoskeletal  (+) Arthritis , Osteoarthritis,    Abdominal   Peds  Hematology  (+) Blood dyscrasia, anemia ,   Anesthesia Other Findings   Reproductive/Obstetrics negative OB ROS                             Anesthesia Physical Anesthesia Plan  ASA: III  Anesthesia Plan: General   Post-op Pain Management:    Induction: Intravenous  PONV Risk Score and Plan: 4 or greater and Propofol infusion and Treatment may vary due to age or medical condition  Airway Management Planned: Mask  Additional Equipment:   Intra-op Plan:   Post-operative Plan:   Informed Consent: I have reviewed the patients History and Physical, chart, labs and discussed the procedure including the risks, benefits and alternatives for the proposed anesthesia with the patient or authorized representative who has indicated his/her understanding and acceptance.     Dental advisory given  Plan  Discussed with: CRNA  Anesthesia Plan Comments:         Anesthesia Quick Evaluation

## 2020-03-04 NOTE — CV Procedure (Signed)
DCC: On Rx anticoagulation with no missed doses  Anesthesia:  Propofol Dr Ola Spurr  [facility]: x 4 200J last two with hand held pressure Converted each time and maintained on last cardioversion  No immediate neurologic sequelae  Jenkins Rouge MD [facility]

## 2020-03-04 NOTE — Discharge Instructions (Signed)
Electrical Cardioversion Electrical cardioversion is the delivery of a jolt of electricity to restore a normal rhythm to the heart. A rhythm that is too fast or is not regular keeps the heart from pumping well. In this procedure, sticky patches or metal paddles are placed on the chest to deliver electricity to the heart from a device. This procedure may be done in an emergency if:  There is low or no blood pressure as a result of the heart rhythm.  Normal rhythm must be restored as fast as possible to protect the brain and heart from further damage.  It may save a life. This may also be a scheduled procedure for irregular or fast heart rhythms that are not immediately life-threatening. Tell a health care provider about:  Any allergies you have.  All medicines you are taking, including vitamins, herbs, eye drops, creams, and over-the-counter medicines.  Any problems you or family members have had with anesthetic medicines.  Any blood disorders you have.  Any surgeries you have had.  Any medical conditions you have.  Whether you are pregnant or may be pregnant. What are the risks? Generally, this is a safe procedure. However, problems may occur, including:  Allergic reactions to medicines.  A blood clot that breaks free and travels to other parts of your body.  The possible return of an abnormal heart rhythm within hours or days after the procedure.  Your heart stopping (cardiac arrest). This is rare. What happens before the procedure? Medicines  Your health care provider may have you start taking: ? Blood-thinning medicines (anticoagulants) so your blood does not clot as easily. ? Medicines to help stabilize your heart rate and rhythm.  Ask your health care provider about: ? Changing or stopping your regular medicines. This is especially important if you are taking diabetes medicines or blood thinners. ? Taking medicines such as aspirin and ibuprofen. These medicines can  thin your blood. Do not take these medicines unless your health care provider tells you to take them. ? Taking over-the-counter medicines, vitamins, herbs, and supplements. General instructions  Follow instructions from your health care provider about eating or drinking restrictions.  Plan to have someone take you home from the hospital or clinic.  If you will be going home right after the procedure, plan to have someone with you for 24 hours.  Ask your health care provider what steps will be taken to help prevent infection. These may include washing your skin with a germ-killing soap. What happens during the procedure?   An IV will be inserted into one of your veins.  Sticky patches (electrodes) or metal paddles may be placed on your chest.  You will be given a medicine to help you relax (sedative).  An electrical shock will be delivered. The procedure may vary among health care providers and hospitals. What can I expect after the procedure?  Your blood pressure, heart rate, breathing rate, and blood oxygen level will be monitored until you leave the hospital or clinic.  Your heart rhythm will be watched to make sure it does not change.  You may have some redness on the skin where the shocks were given. Follow these instructions at home:  Do not drive for 24 hours if you were given a sedative during your procedure.  Take over-the-counter and prescription medicines only as told by your health care provider.  Ask your health care provider how to check your pulse. Check it often.  Rest for 48 hours after the procedure or   as told by your health care provider.  Avoid or limit your caffeine use as told by your health care provider.  Keep all follow-up visits as told by your health care provider. This is important. Contact a health care provider if:  You feel like your heart is beating too quickly or your pulse is not regular.  You have a serious muscle cramp that does not go  away. Get help right away if:  You have discomfort in your chest.  You are dizzy or you feel faint.  You have trouble breathing or you are short of breath.  Your speech is slurred.  You have trouble moving an arm or leg on one side of your body.  Your fingers or toes turn cold or blue. Summary  Electrical cardioversion is the delivery of a jolt of electricity to restore a normal rhythm to the heart.  This procedure may be done right away in an emergency or may be a scheduled procedure if the condition is not an emergency.  Generally, this is a safe procedure.  After the procedure, check your pulse often as told by your health care provider. This information is not intended to replace advice given to you by your health care provider. Make sure you discuss any questions you have with your health care provider. Document Revised: 05/08/2019 Document Reviewed: 05/08/2019 Elsevier Patient Education  2020 [employer] Inc[employer]  

## 2020-03-04 NOTE — Interval H&P Note (Signed)
History and Physical Interval Note:  03/04/2020 9:06 AM  Stephanie [name]  has presented today for surgery, with the diagnosis of AFIB.  The various methods of treatment have been discussed with the patient and family. After consideration of risks, benefits and other options for treatment, the patient has consented to  Procedure(s): CARDIOVERSION (N/A) as a surgical intervention.  The patient's history has been reviewed, patient examined, no change in status, stable for surgery.  I have reviewed the patient's chart and labs.  Questions were answered to the patient's satisfaction.     Jenkins Rouge

## 2020-03-20 ENCOUNTER — Other Ambulatory Visit: Payer: Medicare Other | Admitting: *Deleted

## 2020-03-20 ENCOUNTER — Other Ambulatory Visit: Payer: Self-pay

## 2020-03-20 DIAGNOSIS — I5022 Chronic systolic (congestive) heart failure: Secondary | ICD-10-CM

## 2020-03-20 DIAGNOSIS — I4819 Other persistent atrial fibrillation: Secondary | ICD-10-CM | POA: Diagnosis not present

## 2020-03-20 LAB — BASIC METABOLIC PANEL
BUN/Creatinine Ratio: 18 (ref 12–28)
BUN: 24 mg/dL (ref 8–27)
CO2: 22 mmol/L (ref 20–29)
Calcium: 10 mg/dL (ref 8.7–10.3)
Chloride: 98 mmol/L (ref 96–106)
Creatinine, Ser: 1.33 mg/dL — ABNORMAL HIGH (ref 0.57–1.00)
GFR calc Af Amer: 48 mL/min/{1.73_m2} — ABNORMAL LOW (ref >59)
GFR calc non Af Amer: 41 mL/min/{1.73_m2} — ABNORMAL LOW (ref >59)
Glucose: 231 mg/dL — ABNORMAL HIGH (ref 65–99)
Potassium: 3.7 mmol/L (ref 3.5–5.2)
Sodium: 138 mmol/L (ref 134–144)

## 2020-04-05 ENCOUNTER — Other Ambulatory Visit: Payer: Self-pay

## 2020-04-05 ENCOUNTER — Ambulatory Visit (INDEPENDENT_AMBULATORY_CARE_PROVIDER_SITE_OTHER): Payer: Medicare Other | Admitting: Nurse Practitioner

## 2020-04-05 ENCOUNTER — Encounter: Payer: Self-pay | Admitting: Nurse Practitioner

## 2020-04-05 VITALS — BP 126/70 | HR 80 | Ht 63.0 in | Wt 275.0 lb

## 2020-04-05 DIAGNOSIS — I428 Other cardiomyopathies: Secondary | ICD-10-CM | POA: Diagnosis not present

## 2020-04-05 DIAGNOSIS — I10 Essential (primary) hypertension: Secondary | ICD-10-CM

## 2020-04-05 DIAGNOSIS — I4819 Other persistent atrial fibrillation: Secondary | ICD-10-CM | POA: Diagnosis not present

## 2020-04-05 NOTE — Progress Notes (Signed)
Electrophysiology Office Note Date: 04/05/2020  ID:  Stephanie [name], DOB 1951-07-27, MRN [id number]  PCP: Delrae Rend, MD Electrophysiologist: [name]  CC: AF follow up  Stephanie [name] is a 69 y.o. female seen today for Dr [name].  She presents today for routine electrophysiology followup.  Since last being seen in our clinic, the patient reports doing relatively well.  She has had improvement in energy and shortness of breath since cardioversion.   She denies chest pain, palpitations, dyspnea, PND, orthopnea, nausea, vomiting, dizziness, syncope, edema, weight gain, or early satiety.  Past Medical History:  Diagnosis Date  . Anemia   . Ankle fracture, right    x 2  . Antineoplastic and immunosuppressive drugs causing adverse effect in therapeutic use   . Asthma   . Chronic venous hypertension without complications   . Colon polyps 2009  . Coronary artery disease    no CAD by cath 11.2010  . Depressive disorder, not elsewhere classified   . Diabetes mellitus type 2 in obese (HCC)    hgb AIC 6.1 09/02/2009, insulin dependent   . Diastolic CHF, chronic (HCC)    reports stable  . Diverticulosis of colon 2009   colonoscopy  . Gout    cortisone  shots helped  . Hepatitis, unspecified    hx of/per pt, never was told of having hepatitis  . Hx of hysterectomy 2002  . Hyperlipidemia   . Hypertension    controlled on medication   . Hypothyroidism    reports her thryoid levels are normal now   . Irritable bowel syndrome   . Lung nodule    22mm RLL nodule on CT Chest  07/2009, resolved by 2011 CT  . Noncompliance   . Obesity   . Persistent atrial fibrillation ([place])    s/p DCCV 07/2009; Pradaxa Rx, states she is in sinus rhythm now   . PONV (postoperative nausea and vomiting)    after receiving "too much anesthesia" after a hernia surgery   . Sarcoidosis    dx by eye exam; seen during eye exam , report asymtptomatic "i dont have it now'   . Sleep disturbance 06/2013    sleep study, mild abnormality, no criteria for CPAP   Past Surgical History:  Procedure Laterality Date  . ABDOMINAL HYSTERECTOMY    . CARDIOVERSION     x 2  . CARDIOVERSION N/A 02/27/2013   Procedure: CARDIOVERSION;  Surgeon: Lelon Perla, MD;  Location: [facility] ENDOSCOPY;  Service: Cardiovascular;  Laterality: N/A;  . CARDIOVERSION N/A 03/24/2017   Procedure: CARDIOVERSION;  Surgeon: Josue Hector, MD;  Location: [facility] ENDOSCOPY;  Service: Cardiovascular;  Laterality: N/A;  . CARDIOVERSION N/A 03/04/2020   Procedure: CARDIOVERSION;  Surgeon: Josue Hector, MD;  Location: [facility] ENDOSCOPY;  Service: Cardiovascular;  Laterality: N/A;  . CHOLECYSTECTOMY  early 85s  . COLONOSCOPY     Diverticulosis, colon polyps, repeat 2014, Dr. Deatra Ina  . COLONOSCOPY WITH PROPOFOL N/A 07/25/2019   Procedure: COLONOSCOPY WITH PROPOFOL;  Surgeon: Yetta Flock, MD;  Location: [facility] ENDOSCOPY;  Service: Gastroenterology;  Laterality: N/A;  . ESOPHAGOGASTRODUODENOSCOPY N/A 05/16/2014   Procedure: ESOPHAGOGASTRODUODENOSCOPY (EGD);  Surgeon: Wonda Horner, MD;  Location: [facility] ORS;  Service: Gastroenterology;  Laterality: N/A;  . HERNIA REPAIR  0093   umbilical hernia  . MYOMECTOMY  2000  . POLYPECTOMY  07/25/2019   Procedure: POLYPECTOMY;  Surgeon: Yetta Flock, MD;  Location: [facility] ENDOSCOPY;  Service: Gastroenterology;;    Current Outpatient Medications  Medication Sig Dispense Refill  . carvedilol (COREG) 25 MG tablet TAKE 1 TABLET(25 MG) BY MOUTH TWICE DAILY 180 tablet 3  . cholecalciferol (VITAMIN D3) 25 MCG (1000 UNIT) tablet Take 1,000 Units by mouth daily.    Marland Kitchen ENTRESTO 24-26 MG TAKE 1 TABLET BY MOUTH TWICE DAILY 60 tablet 11  . furosemide (LASIX) 40 MG tablet Take 1.5 tablets (60 mg total) by mouth daily. Weight daily and take additional lasix 40 mg for weight gain of 2-3 pounds 135 tablet 3  . Insulin Pen Needle (NOVOFINE) 30G X 8 MM MISC For Humalog meal time TID 100 each 11  . JARDIANCE 10 MG TABS  tablet Take 10 mg by mouth daily.    . metFORMIN (GLUCOPHAGE) 500 MG tablet Take 500 mg by mouth 2 (two) times daily.     . Multiple Vitamins-Minerals (ECHINACEA ACZ PO) Take 1 tablet by mouth daily.    Marland Kitchen NOVOLOG FLEXPEN 100 UNIT/ML FlexPen Inject 15 Units into the skin daily.     Marland Kitchen PRADAXA 150 MG CAPS capsule TAKE 1 CAPSULE BY MOUTH TWICE DAILY 60 capsule 5  . simvastatin (ZOCOR) 10 MG tablet Take 10 mg by mouth daily.     Marland Kitchen spironolactone (ALDACTONE) 25 MG tablet Take 1 tablet (25 mg total) by mouth daily. 90 tablet 3  . traZODone (DESYREL) 50 MG tablet Take 0.5-1 tablets (25-50 mg total) by mouth at bedtime as needed for sleep. 30 tablet 3   No current facility-administered medications for this visit.    Allergies:   Amiodarone, Albuterol, Atorvastatin, Livalo [pitavastatin], and Propoxyphene n-acetaminophen   Social History: Social History   Socioeconomic History  . Marital status: Single    Spouse name: Not on file  . Number of children: 5  . Years of education: college  . Highest education level: Not on file  Occupational History  . Occupation: Disabled.  Tobacco Use  . Smoking status: Never Smoker  . Smokeless tobacco: Never Used  Vaping Use  . Vaping Use: Never used  Substance and Sexual Activity  . Alcohol use: Yes    Alcohol/week: 0.0 standard drinks    Comment: rare  . Drug use: No  . Sexual activity: Not Currently    Birth control/protection: Surgical  Other Topics Concern  . Not on file  Social History Narrative   Pt lives in [place] with her son.  Unemployed.  Right handed.     Education college   Social Determinants of Radio broadcast assistant Strain:   . Difficulty of Paying Living Expenses:   Food Insecurity:   . Worried About Charity fundraiser in the Last Year:   . Arboriculturist in the Last Year:   Transportation Needs:   . Film/video editor (Medical):   Marland Kitchen Lack of Transportation (Non-Medical):   Physical Activity:   . Days of  Exercise per Week:   . Minutes of Exercise per Session:   Stress:   . Feeling of Stress :   Social Connections:   . Frequency of Communication with Friends and Family:   . Frequency of Social Gatherings with Friends and Family:   . Attends Religious Services:   . Active Member of Clubs or Organizations:   . Attends Archivist Meetings:   Marland Kitchen Marital Status:   Intimate Partner Violence:   . Fear of Current or Ex-Partner:   . Emotionally Abused:   Marland Kitchen Physically Abused:   . Sexually Abused:  Family History: Family History  Problem Relation Age of Onset  . Pneumonia Father        deceased  . Hypertension Father   . Cancer Father   . Multiple sclerosis Mother        hx  . Emphysema Other        runs in the family    Review of Systems: All other systems reviewed and are otherwise negative except as noted above.   Physical Exam: VS:  BP 126/70   Pulse 80   Ht 5\' 3"  (1.6 m)   Wt 275 lb (124.7 kg)   SpO2 98%   BMI 48.71 kg/m  , BMI Body mass index is 48.71 kg/m. Wt Readings from Last 3 Encounters:  04/05/20 275 lb (124.7 kg)  02/26/20 281 lb (127.5 kg)  01/30/20 283 lb (128.4 kg)    GEN- The patient is obese appearing, alert and oriented x 3 today.   HEENT: normocephalic, atraumatic; sclera clear, conjunctiva pink; hearing intact; oropharynx clear; neck supple  Lungs- Clear to ausculation bilaterally, normal work of breathing.  No wheezes, rales, rhonchi Heart- Irregular rate and rhythm  GI- soft, non-tender, non-distended, bowel sounds present  Extremities- no clubbing, cyanosis, or edema  MS- no significant deformity or atrophy Skin- warm and dry, no rash or lesion  Psych- euthymic mood, full affect Neuro- strength and sensation are intact   EKG:  EKG is ordered today. The ekg ordered today shows AF, rate 108  Recent Labs: 02/26/2020: Hemoglobin 11.2; Platelets 250 03/20/2020: BUN 24; Creatinine, Ser 1.33; Potassium 3.7; Sodium 138    Other  studies Reviewed: Additional studies/ records that were reviewed today include: Renee's office notes  Assessment and Plan:  1.  Persistent atrial fibrillation S/p DCCV 03/04/20 She is back in AF today  Continue Pradaxa for CHADS2VASC of 3 - she has missed a couple of doses since cardioversion. We reviewed importance of compliance. She previously was on amiodarone - this was discontinued 2/2 concern for neuro toxicity and hyperthyroidism LA 47 by echo 09/2018. She saw Dr Rayann Heman in 2015 and declined ablation at that time.  Discussed treatment options today. She is clear in her decision to not make changes at this point. We reviewed Tikosyn which I think would be a good option for her. I also think lifestyle modification will be important for long term success.   2.  HTN Stable No change required today  3.  NICM Stable No change required today    Current medicines are reviewed at length with the patient today.   The patient has concerns regarding her medicines.  The following changes were made today:  none  Labs/ tests ordered today include:  Orders Placed This Encounter  Procedures  . EKG 12-Lead     Disposition:   Follow up with Dr [name] in 3-4 weeks for further discussion     Signed, Chanetta Marshall, NP 04/05/2020 5:27 PM   [address] [place] [id number] [phone] (office) [phone] (fax)

## 2020-04-05 NOTE — Patient Instructions (Signed)
Medication Instructions:  *If you need a refill on your cardiac medications before your next appointment, please call your pharmacy*  Lab Work: If you have labs (blood work) drawn today and your tests are completely normal, you will receive your results only by: Marland Kitchen MyChart Message (if you have MyChart) OR . A paper copy in the mail If you have any lab test that is abnormal or we need to change your treatment, we will call you to review the results.  Testing/Procedures: None Ordered  Follow-Up: At [facility], you and your health needs are our priority.  As part of our continuing mission to provide you with exceptional heart care, we have created designated Provider Care Teams.  These Care Teams include your primary Cardiologist (physician) and Advanced Practice Providers (APPs -  Physician Assistants and Nurse Practitioners) who all work together to provide you with the care you need, when you need it.  We recommend signing up for the patient portal called "MyChart".  Sign up information is provided on this After Visit Summary.  MyChart is used to connect with patients for Virtual Visits (Telemedicine).  Patients are able to view lab/test results, encounter notes, upcoming appointments, etc.  Non-urgent messages can be sent to your provider as well.   To learn more about what you can do with MyChart, go to [email or web address].    Your next appointment:    Your physician recommends that you schedule a follow-up appointment in: 3-4 WEEKS with Dr. [name] The format for your next appointment:  In person  Provider:   [name], MD  Other Instructions Please check and record your heart rate (pulse) twice daily until your scheduled follow up with Dr. [name].

## 2020-05-03 ENCOUNTER — Ambulatory Visit (INDEPENDENT_AMBULATORY_CARE_PROVIDER_SITE_OTHER): Payer: Medicare Other | Admitting: Internal Medicine

## 2020-05-03 ENCOUNTER — Other Ambulatory Visit: Payer: Self-pay

## 2020-05-03 ENCOUNTER — Encounter: Payer: Self-pay | Admitting: Internal Medicine

## 2020-05-03 VITALS — BP 137/86 | HR 113 | Ht 63.0 in | Wt 266.0 lb

## 2020-05-03 DIAGNOSIS — E039 Hypothyroidism, unspecified: Secondary | ICD-10-CM

## 2020-05-03 DIAGNOSIS — I4819 Other persistent atrial fibrillation: Secondary | ICD-10-CM

## 2020-05-03 DIAGNOSIS — I428 Other cardiomyopathies: Secondary | ICD-10-CM | POA: Diagnosis not present

## 2020-05-03 DIAGNOSIS — Z79899 Other long term (current) drug therapy: Secondary | ICD-10-CM

## 2020-05-03 LAB — TSH: TSH: 3.14 u[IU]/mL (ref 0.450–4.500)

## 2020-05-03 NOTE — Progress Notes (Signed)
Patient Care Team: Delrae Rend, MD as PCP - General (Endocrinology) Deboraha Sprang, MD as PCP - Electrophysiology (Cardiology)   HPI  Stephanie [name] is a 69 y.o. female Seen in followup for atrial fibrillation in the context of morbid obesity, sarcoid and mild left ventricular hypertrophy.   She underwent cardioversion may/14 with the hopes of restoring sinus rhythm and seeing recovery of LV systolic function. She had rapidly reverted to afib and then spontaneously reverted to sinus rhythm. There has been  interval near normalization of LV systolic function confirmed by echo August 2014   She saw Dr. Greggory Brandy 3/15 to consider catheter ablation.  She was not interested. He reduced her amiodarone to 200 mg a day. Encouraged her to refrain from alcohol and to work on blood pressure and weight reduction.  She had a sleep study 2014 with an AHI of 7.5   She is now being followed by Dr. Buddy Duty and he is working both on her thyroid as well as her diabetes.  Seen 3/21 recurrent afib >> DCCV arrived in sinus rhythm.  Reverted to atrial fibrillation and underwent cardioversion 5/21.  And seen by [facility] 6/21 was back in A. fib.  They discussed dofetilide.  She is feeling much better with less dyspnea, able to climb stairs and cook, no edema      DATE TEST EF   2012 [facility]  [place] w/o obs dis  3/12 Echo     25 %   8/14 Echo  55-60 %   12/17 Echo  25-30% LAE (47/2.0/45)  5/19 Echo 15-20%   12/19 Echo  55-65%            Date TSH Cr K Hgb  6/17 1.9     1/18 <<0.01     3/18 2.2     6/18 7.4 1.1  12.6  5/19  1.09 4.6 11.1  9/19  1.58 4.9 11.8  11/19  1.42 5.2   5/20 4.02     6/21   1.33 3.7 11.2  .     Past Medical History:  Diagnosis Date  . Anemia   . Ankle fracture, right    x 2  . Antineoplastic and immunosuppressive drugs causing adverse effect in therapeutic use   . Asthma   . Chronic venous hypertension without complications   . Colon polyps 2009  . Coronary artery  disease    no CAD by cath 11.2010  . Depressive disorder, not elsewhere classified   . Diabetes mellitus type 2 in obese (HCC)    hgb AIC 6.1 09/02/2009, insulin dependent   . Diastolic CHF, chronic (HCC)    reports stable  . Diverticulosis of colon 2009   colonoscopy  . Gout    cortisone  shots helped  . Hepatitis, unspecified    hx of/per pt, never was told of having hepatitis  . Hx of hysterectomy 2002  . Hyperlipidemia   . Hypertension    controlled on medication   . Hypothyroidism    reports her thryoid levels are normal now   . Irritable bowel syndrome   . Lung nodule    52mm RLL nodule on CT Chest  07/2009, resolved by 2011 CT  . Noncompliance   . Obesity   . Persistent atrial fibrillation ([place])    s/p DCCV 07/2009; Pradaxa Rx, states she is in sinus rhythm now   . PONV (postoperative nausea and vomiting)    after receiving "too much anesthesia" after a  hernia surgery   . Sarcoidosis    dx by eye exam; seen during eye exam , report asymtptomatic "i dont have it now'   . Sleep disturbance 06/2013   sleep study, mild abnormality, no criteria for CPAP    Past Surgical History:  Procedure Laterality Date  . ABDOMINAL HYSTERECTOMY    . CARDIOVERSION     x 2  . CARDIOVERSION N/A 02/27/2013   Procedure: CARDIOVERSION;  Surgeon: Lelon Perla, MD;  Location: [facility] ENDOSCOPY;  Service: Cardiovascular;  Laterality: N/A;  . CARDIOVERSION N/A 03/24/2017   Procedure: CARDIOVERSION;  Surgeon: Josue Hector, MD;  Location: [facility] ENDOSCOPY;  Service: Cardiovascular;  Laterality: N/A;  . CARDIOVERSION N/A 03/04/2020   Procedure: CARDIOVERSION;  Surgeon: Josue Hector, MD;  Location: [facility] ENDOSCOPY;  Service: Cardiovascular;  Laterality: N/A;  . CHOLECYSTECTOMY  early 58s  . COLONOSCOPY     Diverticulosis, colon polyps, repeat 2014, Dr. Deatra Ina  . COLONOSCOPY WITH PROPOFOL N/A 07/25/2019   Procedure: COLONOSCOPY WITH PROPOFOL;  Surgeon: Yetta Flock, MD;  Location: [facility] ENDOSCOPY;   Service: Gastroenterology;  Laterality: N/A;  . ESOPHAGOGASTRODUODENOSCOPY N/A 05/16/2014   Procedure: ESOPHAGOGASTRODUODENOSCOPY (EGD);  Surgeon: Wonda Horner, MD;  Location: [facility] ORS;  Service: Gastroenterology;  Laterality: N/A;  . HERNIA REPAIR  [id number]   umbilical hernia  . MYOMECTOMY  2000  . POLYPECTOMY  07/25/2019   Procedure: POLYPECTOMY;  Surgeon: Yetta Flock, MD;  Location: [facility] ENDOSCOPY;  Service: Gastroenterology;;    Current Outpatient Medications  Medication Sig Dispense Refill  . carvedilol (COREG) 25 MG tablet TAKE 1 TABLET(25 MG) BY MOUTH TWICE DAILY 180 tablet 3  . cholecalciferol (VITAMIN D3) 25 MCG (1000 UNIT) tablet Take 1,000 Units by mouth daily.    Marland Kitchen ENTRESTO 24-26 MG TAKE 1 TABLET BY MOUTH TWICE DAILY 60 tablet 11  . furosemide (LASIX) 40 MG tablet Take 1.5 tablets (60 mg total) by mouth daily. Weight daily and take additional lasix 40 mg for weight gain of 2-3 pounds 135 tablet 3  . Insulin Pen Needle (NOVOFINE) 30G X 8 MM MISC For Humalog meal time TID 100 each 11  . JARDIANCE 10 MG TABS tablet Take 10 mg by mouth daily.    . metFORMIN (GLUCOPHAGE) 500 MG tablet Take 500 mg by mouth 2 (two) times daily.     . Multiple Vitamins-Minerals (ECHINACEA ACZ PO) Take 1 tablet by mouth daily.    Marland Kitchen NOVOLOG FLEXPEN 100 UNIT/ML FlexPen Inject 15 Units into the skin daily.     Marland Kitchen PRADAXA 150 MG CAPS capsule TAKE 1 CAPSULE BY MOUTH TWICE DAILY 60 capsule 5  . simvastatin (ZOCOR) 10 MG tablet Take 10 mg by mouth daily.     Marland Kitchen spironolactone (ALDACTONE) 25 MG tablet Take 1 tablet (25 mg total) by mouth daily. 90 tablet 3  . traZODone (DESYREL) 50 MG tablet Take 0.5-1 tablets (25-50 mg total) by mouth at bedtime as needed for sleep. 30 tablet 3   No current facility-administered medications for this visit.    Allergies  Allergen Reactions  . Amiodarone Other (See Comments)    adverse reaction: Tremor/gait disurbance  . Albuterol Palpitations    Palpitations, intolerance    . Atorvastatin Other (See Comments)    Body aches and pains--stiffness.   [name] [Pitavastatin]     Body aches  . Propoxyphene N-Acetaminophen Nausea And Vomiting    Review of Systems negative except from HPI and PMH  Physical Exam   BP 137/86  Pulse (!) 113   Ht 5\' 3"  (1.6 m)   Wt 266 lb (120.7 kg)   BMI 47.12 kg/m  Well developed and obese in no acute distress HENT normal Neck supple   Carotids brisk and full without bruits Clear Irregularly irregular rate and rhythm with rapid ventricular response, no murmurs or gallops Abd-soft with active BS without hepatomegaly No Clubbing cyanosis edema Skin-warm and dry A & Oriented  Grossly normal sensory and motor function   ECG afib @ 113 -/10/36  QTC with a [place]a calculation was about 450 ms  QTC on the post cardioversion ECG 03/04/2020.  420 ms   Assessment and  Plan  Hypertension     Atrial fibrillation-persistent    Cardiomyopathy non ischemic resolved intercurrently  Congestive heart failure acute/chronic class 3  Sarcoid  Obesity - morbid   Tremor   Euthyroid     Heart failure status is better.  She is euvolemic.  Her atrial fibrillation rates remained modestly rapid.  Concerns are both symptoms which are scant but also tachycardia induced cardiomyopathy.  We will check an echo.  We discussed strategies of rate versus rhythm control.  Discussed dofetilide and its possible proarrhythmic side effects she is reluctant to pursue this at this time.  She would like to discuss it with her family.  If LV function has deteriorated again in the setting of atrial fibrillation she is more inclined towards dofetilide.  If not, she may be more inclined towards rate control.  At that juncture, we would undertake a Holter monitor to review heart rate consider alternative drugs, either digoxin depending on LV function and or perhaps adding a adjunctive metoprolol to her carvedilol.  On Anticoagulation;  No bleeding  issues   We will check her TSH.

## 2020-05-03 NOTE — Patient Instructions (Signed)
Medication Instructions:  Your physician recommends that you continue on your current medications as directed. Please refer to the Current Medication list given to you today.  *If you need a refill on your cardiac medications before your next appointment, please call your pharmacy*   Lab Work: TSH today  If you have labs (blood work) drawn today and your tests are completely normal, you will receive your results only by: Marland Kitchen MyChart Message (if you have MyChart) OR . A paper copy in the mail If you have any lab test that is abnormal or we need to change your treatment, we will call you to review the results.   Testing/Procedures: Your physician has requested that you have an echocardiogram. Echocardiography is a painless test that uses sound waves to create images of your heart. It provides your doctor with information about the size and shape of your heart and how well your heart's chambers and valves are working. This procedure takes approximately one hour. There are no restrictions for this procedure.    Follow-Up: At [facility], you and your health needs are our priority.  As part of our continuing mission to provide you with exceptional heart care, we have created designated Provider Care Teams.  These Care Teams include your primary Cardiologist (physician) and Advanced Practice Providers (APPs -  Physician Assistants and Nurse Practitioners) who all work together to provide you with the care you need, when you need it.  We recommend signing up for the patient portal called "MyChart".  Sign up information is provided on this After Visit Summary.  MyChart is used to connect with patients for Virtual Visits (Telemedicine).  Patients are able to view lab/test results, encounter notes, upcoming appointments, etc.  Non-urgent messages can be sent to your provider as well.   To learn more about what you can do with MyChart, go to [email or web address].    Your next appointment:  RN will  call you in 2 weeks for follow up from today's visit.

## 2020-05-10 ENCOUNTER — Other Ambulatory Visit: Payer: Self-pay | Admitting: Internal Medicine

## 2020-05-28 ENCOUNTER — Other Ambulatory Visit: Payer: Self-pay

## 2020-05-28 ENCOUNTER — Ambulatory Visit (HOSPITAL_COMMUNITY): Payer: Medicare Other | Attending: Cardiology

## 2020-05-28 DIAGNOSIS — I428 Other cardiomyopathies: Secondary | ICD-10-CM

## 2020-05-28 DIAGNOSIS — I4819 Other persistent atrial fibrillation: Secondary | ICD-10-CM

## 2020-05-28 LAB — ECHOCARDIOGRAM COMPLETE: S' Lateral: 3.1 cm

## 2020-05-30 ENCOUNTER — Telehealth: Payer: Self-pay

## 2020-05-30 NOTE — Telephone Encounter (Signed)
-----   Message from Deboraha Sprang, MD sent at 05/29/2020 10:13 AM EDT ----- Please Inform Patient Echo showed mild worsening of heart muscle function   With hx of afib going rapidly would encourage [place] to think about some medication , like dofetilide to try and get and keep her in sinus rhythm  .Thanks SK

## 2020-05-30 NOTE — Telephone Encounter (Addendum)
Spoke with pt and advised per Dr [name] echo shows mild worsening of heart muscle function and encouraged to consider medication, Dofetilide.  Pt was given Dofetilide information sheet during her last appointment.  Pt will consider, she is going [place] of [place] for the rest of the month and contact office once she returns either with a decision or to schedule appointment to discuss further with Dr [name].  Pt thanked Therapist, sports for the call.

## 2020-05-31 DIAGNOSIS — E669 Obesity, unspecified: Secondary | ICD-10-CM | POA: Diagnosis not present

## 2020-05-31 DIAGNOSIS — Z7984 Long term (current) use of oral hypoglycemic drugs: Secondary | ICD-10-CM | POA: Diagnosis not present

## 2020-05-31 DIAGNOSIS — E1165 Type 2 diabetes mellitus with hyperglycemia: Secondary | ICD-10-CM | POA: Diagnosis not present

## 2020-05-31 DIAGNOSIS — I10 Essential (primary) hypertension: Secondary | ICD-10-CM | POA: Diagnosis not present

## 2020-05-31 DIAGNOSIS — Z79899 Other long term (current) drug therapy: Secondary | ICD-10-CM | POA: Diagnosis not present

## 2020-06-09 ENCOUNTER — Other Ambulatory Visit: Payer: Self-pay | Admitting: Internal Medicine

## 2020-06-24 NOTE — Progress Notes (Signed)
[facility] [place] Phone: [phone] Subjective:   Stephanie [name], am serving as a scribe for Dr. Hulan Saas. This visit occurred during the SARS-CoV-2 public health emergency.  Safety protocols were in place, including screening questions prior to the visit, additional usage of staff PPE, and extensive cleaning of exam room while observing appropriate contact time as indicated for disinfecting solutions.   I'm seeing this patient by the request  of:  Delrae Rend, MD  CC: Bilateral knee pain  TXM:[id number]   06/13/2019 Patient did very well after the injections and do not want to do the viscosupplementation at this point.  We discussed with patient to continue topical anti-inflammatories, encouraged weight loss, continue with the icing regimen.  Follow-up again in 3 months  Update 06/25/2020 Stephanie [name] is a 69 y.o. female coming in with complaint of bilateral knee pain. Patient states that she had blood sugar monitor in her arm one week ago and her knee pain increased. Was taking gout medication last year. Is wondering if she needs medication for this condition.       Past Medical History:  Diagnosis Date  . Anemia   . Ankle fracture, right    x 2  . Antineoplastic and immunosuppressive drugs causing adverse effect in therapeutic use   . Asthma   . Chronic venous hypertension without complications   . Colon polyps 2009  . Coronary artery disease    [name] CAD by cath 11.2010  . Depressive disorder, not elsewhere classified   . Diabetes mellitus type 2 in obese (HCC)    hgb AIC 6.1 09/02/2009, insulin dependent   . Diastolic CHF, chronic (HCC)    reports stable  . Diverticulosis of colon 2009   colonoscopy  . Gout    cortisone  shots helped  . Hepatitis, unspecified    hx of/per pt, never was told of having hepatitis  . Hx of hysterectomy 2002  . Hyperlipidemia   . Hypertension    controlled on  medication   . Hypothyroidism    reports her thryoid levels are normal now   . Irritable bowel syndrome   . Lung nodule    61mm RLL nodule on CT Chest  07/2009, resolved by 2011 CT  . Noncompliance   . Obesity   . Persistent atrial fibrillation ([place])    s/p DCCV 07/2009; Pradaxa Rx, states she is in sinus rhythm now   . PONV (postoperative nausea and vomiting)    after receiving "too much anesthesia" after a hernia surgery   . Sarcoidosis    dx by eye exam; seen during eye exam , report asymtptomatic "i dont have it now'   . Sleep disturbance 06/2013   sleep study, mild abnormality, [name] criteria for CPAP   Past Surgical History:  Procedure Laterality Date  . ABDOMINAL HYSTERECTOMY    . CARDIOVERSION     x 2  . CARDIOVERSION N/A 02/27/2013   Procedure: CARDIOVERSION;  Surgeon: Lelon Perla, MD;  Location: [facility] ENDOSCOPY;  Service: Cardiovascular;  Laterality: N/A;  . CARDIOVERSION N/A 03/24/2017   Procedure: CARDIOVERSION;  Surgeon: Josue Hector, MD;  Location: [facility] ENDOSCOPY;  Service: Cardiovascular;  Laterality: N/A;  . CARDIOVERSION N/A 03/04/2020   Procedure: CARDIOVERSION;  Surgeon: Josue Hector, MD;  Location: [facility]COPY;  Service: Cardiovascular;  Laterality: N/A;  . CHOLECYSTECTOMY  early 53s  . COLONOSCOPY     Diverticulosis, colon polyps, repeat 2014, Dr. Deatra Ina  .  COLONOSCOPY WITH PROPOFOL N/A 07/25/2019   Procedure: COLONOSCOPY WITH PROPOFOL;  Surgeon: Yetta Flock, MD;  Location: [facility] ENDOSCOPY;  Service: Gastroenterology;  Laterality: N/A;  . ESOPHAGOGASTRODUODENOSCOPY N/A 05/16/2014   Procedure: ESOPHAGOGASTRODUODENOSCOPY (EGD);  Surgeon: Wonda Horner, MD;  Location: [facility] ORS;  Service: Gastroenterology;  Laterality: N/A;  . HERNIA REPAIR  [id number]   umbilical hernia  . MYOMECTOMY  2000  . POLYPECTOMY  07/25/2019   Procedure: POLYPECTOMY;  Surgeon: Yetta Flock, MD;  Location: [facility] ENDOSCOPY;  Service: Gastroenterology;;   Social History   Socioeconomic  History  . Marital status: Single    Spouse name: Not on file  . Number of children: 5  . Years of education: college  . Highest education level: Not on file  Occupational History  . Occupation: Disabled.  Tobacco Use  . Smoking status: Never Smoker  . Smokeless tobacco: Never Used  Vaping Use  . Vaping Use: Never used  Substance and Sexual Activity  . Alcohol use: Yes    Alcohol/week: 0.0 standard drinks    Comment: rare  . Drug use: [name]  . Sexual activity: Not Currently    Birth control/protection: Surgical  Other Topics Concern  . Not on file  Social History Narrative   Pt lives in [place] with her son.  Unemployed.  Right handed.     Education college   Social Determinants of Health   Financial Resource Strain:   . Difficulty of Paying Living Expenses: Not on file  Food Insecurity:   . Worried About Charity fundraiser in the Last Year: Not on file  . Ran Out of Food in the Last Year: Not on file  Transportation Needs:   . Lack of Transportation (Medical): Not on file  . Lack of Transportation (Non-Medical): Not on file  Physical Activity:   . Days of Exercise per Week: Not on file  . Minutes of Exercise per Session: Not on file  Stress:   . Feeling of Stress : Not on file  Social Connections:   . Frequency of Communication with Friends and Family: Not on file  . Frequency of Social Gatherings with Friends and Family: Not on file  . Attends Religious Services: Not on file  . Active Member of Clubs or Organizations: Not on file  . Attends Archivist Meetings: Not on file  . Marital Status: Not on file   Allergies  Allergen Reactions  . Amiodarone Other (See Comments)    adverse reaction: Tremor/gait disurbance  . Albuterol Palpitations    Palpitations, intolerance  . Atorvastatin Other (See Comments)    Body aches and pains--stiffness.   [name] [Pitavastatin]     Body aches  . Propoxyphene N-Acetaminophen Nausea And Vomiting   Family  History  Problem Relation Age of Onset  . Pneumonia Father        deceased  . Hypertension Father   . Cancer Father   . Multiple sclerosis Mother        hx  . Emphysema Other        runs in the family    Current Outpatient Medications (Endocrine & Metabolic):  Marland Kitchen  JARDIANCE 10 MG TABS tablet, Take 10 mg by mouth daily. .  metFORMIN (GLUCOPHAGE) 500 MG tablet, Take 500 mg by mouth 2 (two) times daily.  Marland Kitchen  NOVOLOG FLEXPEN 100 UNIT/ML FlexPen, Inject 15 Units into the skin daily.   Current Outpatient Medications (Cardiovascular):  .  carvedilol (COREG) 25 MG tablet,  TAKE 1 TABLET(25 MG) BY MOUTH TWICE DAILY .  ENTRESTO 24-26 MG, TAKE 1 TABLET BY MOUTH TWICE DAILY .  furosemide (LASIX) 40 MG tablet, TAKE 1.5 TABLETS BY MOUTH DAILY, IF AN ADDITIONAL WEIGHT GAIN OF 2-3 POUNDS TAKE 1 EXTRA TABLET .  simvastatin (ZOCOR) 10 MG tablet, Take 10 mg by mouth daily.  Marland Kitchen  spironolactone (ALDACTONE) 25 MG tablet, TAKE 1 TABLET(25 MG) BY MOUTH DAILY   Current Outpatient Medications (Analgesics):  .  colchicine 0.6 MG tablet, Take 1 tablet (0.6 mg total) by mouth daily.  Current Outpatient Medications (Hematological):  Marland Kitchen  PRADAXA 150 MG CAPS capsule, TAKE 1 CAPSULE BY MOUTH TWICE DAILY  Current Outpatient Medications (Other):  .  cholecalciferol (VITAMIN D3) 25 MCG (1000 UNIT) tablet, Take 1,000 Units by mouth daily. .  Insulin Pen Needle (NOVOFINE) 30G X 8 MM MISC, For Humalog meal time TID .  Multiple Vitamins-Minerals (ECHINACEA ACZ PO), Take 1 tablet by mouth daily. .  traZODone (DESYREL) 50 MG tablet, Take 0.5-1 tablets (25-50 mg total) by mouth at bedtime as needed for sleep.   Reviewed prior external information including notes and imaging from  primary care provider As well as notes that were available from care everywhere and other healthcare systems.  Past medical history, social, surgical and family history all reviewed in electronic medical record.  [name] pertanent information unless  stated regarding to the chief complaint.   Review of Systems:  [name] headache, visual changes, nausea, vomiting, diarrhea, constipation, dizziness, abdominal pain, skin rash, fevers, chills, night sweats, weight loss, swollen lymph nodes, , chest pain, shortness of breath, mood changes. POSITIVE muscle aches, body aches joint swelling  Objective  Blood pressure 128/84, pulse 96, height 5\' 3"  (1.6 m), weight 267 lb (121.1 kg), SpO2 99 %.   General: [name] apparent distress alert and oriented x3 mood and affect normal, dressed appropriately.  HEENT: Pupils equal, extraocular movements intact  Respiratory: Patient's speak in full sentences and does not appear short of breath  Cardiovascular: Trace lower extremity edema, non tender, [name] erythema  Neuro: Neurovascularly intact bilaterally Gait antalgic  Knee: Bilateral valgus deformity noted. Large thigh to calf ratio.  Difficult to assess secondary to patient's body habitus Tender to palpation over medial and PF joint line.  ROM full in flexion and extension and lower leg rotation. instability with valgus force.  painful patellar compression. Patellar glide with moderate crepitus. Patellar and quadriceps tendons unremarkable. Hamstring and quadriceps strength is normal.  After informed written and verbal consent, patient was seated on exam table. Right knee was prepped with alcohol swab and utilizing anterolateral approach, patient's right knee space was injected with 4:1  marcaine 0.5%: Kenalog 40mg /dL. Patient tolerated the procedure well without immediate complications.  After informed written and verbal consent, patient was seated on exam table. Left knee was prepped with alcohol swab and utilizing anterolateral approach, patient's left knee space was injected with 4:1  marcaine 0.5%: Kenalog 40mg /dL. Patient tolerated the procedure well without immediate complications.   Impression and Recommendations:     The above documentation has been  reviewed and is accurate and complete Lyndal Pulley, DO       Note: This dictation was prepared with Dragon dictation along with smaller phrase technology. Any transcriptional errors that result from this process are unintentional.

## 2020-06-25 ENCOUNTER — Other Ambulatory Visit: Payer: Self-pay

## 2020-06-25 ENCOUNTER — Encounter: Payer: Self-pay | Admitting: Family Medicine

## 2020-06-25 ENCOUNTER — Ambulatory Visit (INDEPENDENT_AMBULATORY_CARE_PROVIDER_SITE_OTHER): Payer: Medicare Other | Admitting: Family Medicine

## 2020-06-25 DIAGNOSIS — M170 Bilateral primary osteoarthritis of knee: Secondary | ICD-10-CM

## 2020-06-25 MED ORDER — COLCHICINE 0.6 MG PO TABS
0.6000 mg | ORAL_TABLET | Freq: Every day | ORAL | 0 refills | Status: DC
Start: 2020-06-25 — End: 2020-11-13

## 2020-06-25 NOTE — Assessment & Plan Note (Signed)
Known severe arthritic changes in the knees bilaterally.  Patient has given steroid injections again.  Patient has had uncontrolled diabetes but can check blood glucose at home.  Encourage patient to do so, half dose of the Kenalog given today to avoid any type of increased patient knows if blood sugars go over 350 she does go to the emergency room immediately.  Due to other comorbidities and social determinants of health including problems with transportation unable to do anything more at this time.  Follow-up with me again in 3 months.  Colchicine given for patients with gout patient given a very short trial of a high-dose ibuprofen but has not held his intake is very intermittently with 1 pill with patient's creatinine of 1.3.  Encouraged her to follow-up with primary care provider to see if anything else can be done for her high blood sugars.  Follow-up again in 3 months.  Total time reviewing patient's chart on the day of the office visit in face-to-face 40 minutes

## 2020-06-25 NOTE — Patient Instructions (Addendum)
[place] office number Injected both knees today Colchicine daily for 10 days Duexis daily as needed-try to limit See me back in 3 months

## 2020-07-09 ENCOUNTER — Other Ambulatory Visit: Payer: Self-pay | Admitting: Internal Medicine

## 2020-07-09 ENCOUNTER — Telehealth: Payer: Self-pay

## 2020-07-09 NOTE — Telephone Encounter (Signed)
Pradaxa 150mg  refill request received. Pt is 69 years old, weight-121.1kg, Crea-1.33 on 03/20/2020, last seen by Dr. [name] on 05/03/2020, Diagnosis-Afib, CrCl-77.54ml/min; Dose is appropriate based on dosing criteria. Will send in refill to requested pharmacy.

## 2020-07-09 NOTE — Telephone Encounter (Signed)
Spoke with pt who states she is willing to proceed with hospital admission for Tikosyn.  Pt advised will notify Dr [name] and request staff from Afib clinic contact her to schedule appointment.  Pt verbalizes understanding and agrees with current plan.

## 2020-07-09 NOTE — Telephone Encounter (Signed)
Pradaxa refill was sent this morning at 905am to the same pharmacy for 60 tabs and 8 refills.Called the pharmacy and they haven't received so gave a verbal for 60 tabs and 8 refills and she stated she would process it. This refill will be denied since request has been taken care of.

## 2020-07-11 DIAGNOSIS — G4700 Insomnia, unspecified: Secondary | ICD-10-CM | POA: Diagnosis not present

## 2020-07-11 DIAGNOSIS — I4891 Unspecified atrial fibrillation: Secondary | ICD-10-CM | POA: Diagnosis not present

## 2020-07-11 DIAGNOSIS — Z1239 Encounter for other screening for malignant neoplasm of breast: Secondary | ICD-10-CM | POA: Diagnosis not present

## 2020-07-11 DIAGNOSIS — I10 Essential (primary) hypertension: Secondary | ICD-10-CM | POA: Diagnosis not present

## 2020-07-11 DIAGNOSIS — E785 Hyperlipidemia, unspecified: Secondary | ICD-10-CM | POA: Diagnosis not present

## 2020-07-11 DIAGNOSIS — Z86010 Personal history of colonic polyps: Secondary | ICD-10-CM | POA: Diagnosis not present

## 2020-07-11 DIAGNOSIS — Z0000 Encounter for general adult medical examination without abnormal findings: Secondary | ICD-10-CM | POA: Diagnosis not present

## 2020-07-11 DIAGNOSIS — Z23 Encounter for immunization: Secondary | ICD-10-CM | POA: Diagnosis not present

## 2020-07-11 DIAGNOSIS — E119 Type 2 diabetes mellitus without complications: Secondary | ICD-10-CM | POA: Diagnosis not present

## 2020-07-11 DIAGNOSIS — I428 Other cardiomyopathies: Secondary | ICD-10-CM | POA: Diagnosis not present

## 2020-07-11 DIAGNOSIS — Z8639 Personal history of other endocrine, nutritional and metabolic disease: Secondary | ICD-10-CM | POA: Diagnosis not present

## 2020-07-15 NOTE — Telephone Encounter (Signed)
Per Dr [name], due to Covid, [facility] admissions are currently on hold until further notice.  Attempted phone call to pt.  Left voicemail message for pt to contact RN at [phone].

## 2020-07-18 NOTE — Telephone Encounter (Signed)
Spoke with pt and advised per Dr [name] hospital is not current;y admitting for initiation of Tikosyn due to Covid.  Pt reports she is doing failrly well and denies current symptoms of CP, SOB or dizziness.  Pt reports she has decreased her caffeine intake d/t nervousness and believes this has helped.  Pt to continue medications as prescribed and routine follow up.  Reviewed ED precautions.  Pt verbalizes understanding and agrees with current plan.

## 2020-07-20 NOTE — Telephone Encounter (Signed)
We will have to have her go to Afib clinic when hospital opens up

## 2020-07-22 DIAGNOSIS — Z23 Encounter for immunization: Secondary | ICD-10-CM | POA: Diagnosis not present

## 2020-07-22 DIAGNOSIS — N183 Chronic kidney disease, stage 3 unspecified: Secondary | ICD-10-CM | POA: Diagnosis not present

## 2020-07-22 DIAGNOSIS — E785 Hyperlipidemia, unspecified: Secondary | ICD-10-CM | POA: Diagnosis not present

## 2020-07-22 DIAGNOSIS — Z1239 Encounter for other screening for malignant neoplasm of breast: Secondary | ICD-10-CM | POA: Diagnosis not present

## 2020-07-22 DIAGNOSIS — E119 Type 2 diabetes mellitus without complications: Secondary | ICD-10-CM | POA: Diagnosis not present

## 2020-07-22 DIAGNOSIS — I10 Essential (primary) hypertension: Secondary | ICD-10-CM | POA: Diagnosis not present

## 2020-07-22 DIAGNOSIS — Z1231 Encounter for screening mammogram for malignant neoplasm of breast: Secondary | ICD-10-CM | POA: Diagnosis not present

## 2020-07-22 DIAGNOSIS — G4700 Insomnia, unspecified: Secondary | ICD-10-CM | POA: Diagnosis not present

## 2020-09-03 DIAGNOSIS — Z7189 Other specified counseling: Secondary | ICD-10-CM | POA: Diagnosis not present

## 2020-09-03 DIAGNOSIS — E669 Obesity, unspecified: Secondary | ICD-10-CM | POA: Diagnosis not present

## 2020-09-03 DIAGNOSIS — Z7984 Long term (current) use of oral hypoglycemic drugs: Secondary | ICD-10-CM | POA: Diagnosis not present

## 2020-09-03 DIAGNOSIS — E1165 Type 2 diabetes mellitus with hyperglycemia: Secondary | ICD-10-CM | POA: Diagnosis not present

## 2020-09-03 DIAGNOSIS — I10 Essential (primary) hypertension: Secondary | ICD-10-CM | POA: Diagnosis not present

## 2020-09-03 NOTE — Progress Notes (Deleted)
Cardiology Office Note Date:  09/03/2020  Patient ID:  Stephanie, [name] 1951-06-05, MRN [id number] PCP:  Delrae Rend, MD  Electrophysiologist: Dr. [name] Neuology: Dr. Brett Fairy Gastroenterology: Dr. Deatra Ina   Chief Complaint: planned follow up  History of Present Illness: Stephanie [name] is a 69 y.o. female with history of PAFib, sarcoidosis (eye), IBS, HLD, No obst CAD by cath in 2010, recurrent NICM felt 2/2 tachycardia/AF.   I saw her in 2017,  She was feeling much better off the Methimazole, she was pending referral to a new endocrinologist.  She denied any kind of CP, no overt palpitations, no dizziness, near syncope or syncope,  No SOB.  She had some edema reportedly better then the day prior and attributed this to not having been up and around to much.   She assured me she has not missed a single dose of her Pradaxa since her last visit a month ago, has been very religious about it.  She denies any bleeding or signs of bleeding.  She has been inadvertently taking 2 coreg tablets BID though.   She was noted to have recurrent reduction in her EF, she was in an AFib/flutter (90bpm) and planned for DCCV.  I saw her again Jan 2021 for Dr. [name], last seen by him July 2020, by symptoms had not thought she had been having any AF.  No changes were made Most recent echo Dec 2019, LVEF 55-60%  She was doing OK, very worried about COVID.  She has some brief palpitations, at longest and hour or 2, infrequently with no associated symptoms She mentioned heartburn of late She described a burning in the center of her chest when she drank soda especially on an empty stomach that just self resolves.  Not exertional or positional, no CP otherwise She did not exercise, but denies any CP, SOB/DOE or difficulties with her ADLs No dizzy spells, no near syncope or syncope No bleeding or signs of bleeding No changes were made  She saw Dr. [name] in March 2021, she was in Afib w/RVR, worsening SOB/CHF  symptoms planned for DCCV.  She arrived to the cardioversion in SR.  She saw A. Tillery, Pump Back 01/30/2020, again feeling poorly, discussed often missing her medicines, had been without her coreg for a week. Particularly as well, missing her PM doses of her BID meds, including her pradaxa. She was in AF (98bpm) She was urged get back on her medicines, discussed importance of strict medication compliance.  Planned for f/u, if on her medicines, consider DCCV.  I saw her 02/26/20 She remains fatigued.   No CP, no rest SOB.  She is easily fatigued, has "no energy to do anything".  NO dizzy spells, near syncope or syncope. She explains that she was helping care for her cousin who was ding from cancer, during this she found it hard to keep up with her medicines.  He is now in Hospice and since her visit with [name] has been taking her medicines without fail.  IN specific, we discussed her Pradaxa and she is certain she has not missed a dose in the last 3+ weeks. No bleeding or signs of bleeding  She was DCCV 03/04/20  She saw Amber after her DCCV and was feeling better though was back in AF, missed a couple doses of her [place] post DCCV and was counseled discussed Tiksoyn, she did not want to make any changes   She saw Dr. [name] in July, in AFib, discussed her AF rtes mostly  fast, she was still feeling better however with improved exertional capacity, planned for an echo, she was still reluctant to consider AAD/Tikosyn, though pending echo for decision. Her LVEF was reduced some to 45-50% and recommended Tikosyn for attempts at rhythm control, she was initially not agreeable, though Sept decided to go forward, unfortunately the hospital not taking elective admissions and deferred.   TODAY  *** symptoms *** volume *** compliance with OAC *** bleeding, pradaxa, dose *** labs, lipids *** Tikosyn?   Her AF/AAD history: --DCCV 2010, May 2014 with raid return of AF then sponatenous conversion to SR --March  2015 discussed with Dr. Rayann Heman possible ablation but did not want to persue this, her amio was reduced to 200mg  daily and recommended lifestyle adjustments --Amiodarone ultimately stopped with concerns of possible toxicity with neuro symptoms of staggering/tremor with improvement off as well as hyperthyroidism  -- CM felt to be AF/rate related 25% IN 2012, that is improved to 50-55% in 2014>> Dec 2017 again 25% in 2018 -- DCCV (x2 shocks) 03/24/2017 -- Mild abnormal sleep study no indication for CPAP 2014 -- May 2021, DCCV w/ERAF   Past Medical History:  Diagnosis Date  . Anemia   . Ankle fracture, right    x 2  . Antineoplastic and immunosuppressive drugs causing adverse effect in therapeutic use   . Asthma   . Chronic venous hypertension without complications   . Colon polyps 2009  . Coronary artery disease    no CAD by cath 11.2010  . Depressive disorder, not elsewhere classified   . Diabetes mellitus type 2 in obese (HCC)    hgb AIC 6.1 09/02/2009, insulin dependent   . Diastolic CHF, chronic (HCC)    reports stable  . Diverticulosis of colon 2009   colonoscopy  . Gout    cortisone  shots helped  . Hepatitis, unspecified    hx of/per pt, never was told of having hepatitis  . Hx of hysterectomy 2002  . Hyperlipidemia   . Hypertension    controlled on medication   . Hypothyroidism    reports her thryoid levels are normal now   . Irritable bowel syndrome   . Lung nodule    56mm RLL nodule on CT Chest  07/2009, resolved by 2011 CT  . Noncompliance   . Obesity   . Persistent atrial fibrillation ([place])    s/p DCCV 07/2009; Pradaxa Rx, states she is in sinus rhythm now   . PONV (postoperative nausea and vomiting)    after receiving "too much anesthesia" after a hernia surgery   . Sarcoidosis    dx by eye exam; seen during eye exam , report asymtptomatic "i dont have it now'   . Sleep disturbance 06/2013   sleep study, mild abnormality, no criteria for CPAP    Past Surgical  History:  Procedure Laterality Date  . ABDOMINAL HYSTERECTOMY    . CARDIOVERSION     x 2  . CARDIOVERSION N/A 02/27/2013   Procedure: CARDIOVERSION;  Surgeon: Lelon Perla, MD;  Location: [facility] ENDOSCOPY;  Service: Cardiovascular;  Laterality: N/A;  . CARDIOVERSION N/A 03/24/2017   Procedure: CARDIOVERSION;  Surgeon: Josue Hector, MD;  Location: [facility] ENDOSCOPY;  Service: Cardiovascular;  Laterality: N/A;  . CARDIOVERSION N/A 03/04/2020   Procedure: CARDIOVERSION;  Surgeon: Josue Hector, MD;  Location: [facility] ENDOSCOPY;  Service: Cardiovascular;  Laterality: N/A;  . CHOLECYSTECTOMY  early 64s  . COLONOSCOPY     Diverticulosis, colon polyps, repeat 2014, Dr. Deatra Ina  .  COLONOSCOPY WITH PROPOFOL N/A 07/25/2019   Procedure: COLONOSCOPY WITH PROPOFOL;  Surgeon: Yetta Flock, MD;  Location: [facility] ENDOSCOPY;  Service: Gastroenterology;  Laterality: N/A;  . ESOPHAGOGASTRODUODENOSCOPY N/A 05/16/2014   Procedure: ESOPHAGOGASTRODUODENOSCOPY (EGD);  Surgeon: Wonda Horner, MD;  Location: [facility] ORS;  Service: Gastroenterology;  Laterality: N/A;  . HERNIA REPAIR  1740   umbilical hernia  . MYOMECTOMY  2000  . POLYPECTOMY  07/25/2019   Procedure: POLYPECTOMY;  Surgeon: Yetta Flock, MD;  Location: [facility] ENDOSCOPY;  Service: Gastroenterology;;    Current Outpatient Medications  Medication Sig Dispense Refill  . carvedilol (COREG) 25 MG tablet TAKE 1 TABLET(25 MG) BY MOUTH TWICE DAILY 180 tablet 3  . cholecalciferol (VITAMIN D3) 25 MCG (1000 UNIT) tablet Take 1,000 Units by mouth daily.    . colchicine 0.6 MG tablet Take 1 tablet (0.6 mg total) by mouth daily. 10 tablet 0  . ENTRESTO 24-26 MG TAKE 1 TABLET BY MOUTH TWICE DAILY 60 tablet 11  . furosemide (LASIX) 40 MG tablet TAKE 1.5 TABLETS BY MOUTH DAILY, IF AN ADDITIONAL WEIGHT GAIN OF 2-3 POUNDS TAKE 1 EXTRA TABLET 180 tablet 3  . Insulin Pen Needle (NOVOFINE) 30G X 8 MM MISC For Humalog meal time TID 100 each 11  . JARDIANCE 10 MG TABS tablet  Take 10 mg by mouth daily.    . metFORMIN (GLUCOPHAGE) 500 MG tablet Take 500 mg by mouth 2 (two) times daily.     . Multiple Vitamins-Minerals (ECHINACEA ACZ PO) Take 1 tablet by mouth daily.    Marland Kitchen NOVOLOG FLEXPEN 100 UNIT/ML FlexPen Inject 15 Units into the skin daily.     Marland Kitchen PRADAXA 150 MG CAPS capsule TAKE 1 CAPSULE BY MOUTH TWICE DAILY 60 capsule 8  . simvastatin (ZOCOR) 10 MG tablet Take 10 mg by mouth daily.     Marland Kitchen spironolactone (ALDACTONE) 25 MG tablet TAKE 1 TABLET(25 MG) BY MOUTH DAILY 90 tablet 3  . traZODone (DESYREL) 50 MG tablet Take 0.5-1 tablets (25-50 mg total) by mouth at bedtime as needed for sleep. 30 tablet 3   No current facility-administered medications for this visit.    Allergies:   Amiodarone, Albuterol, Atorvastatin, Livalo [pitavastatin], and Propoxyphene n-acetaminophen   Social History:  The patient  reports that she has never smoked. She has never used smokeless tobacco. She reports current alcohol use. She reports that she does not use drugs.   Family History:  The patient's family history includes Cancer in her father; Emphysema in an other family member; Hypertension in her father; Multiple sclerosis in her mother; Pneumonia in her father.  ROS:  Please see the history of present illness.   All other systems are reviewed and otherwise negative.   PHYSICAL EXAM:  VS:  There were no vitals taken for this visit. BMI: There is no height or weight on file to calculate BMI. Very plesant BF, obese, in no acute distress  HEENT: normocephalic, atraumatic  Neck: no JVD, carotid bruits or masses Cardiac: ***,  no significant murmurs, no rubs, or gallops Lungs: *** CTA b/l, no wheezing, rhonchi or rales  Abd: soft, nontender, obese MS: no deformity or *** atrophy Ext:  *** trace edema b/l LE  Skin: warm and dry, no rash Neuro:  No gross deficits appreciated Psych: euthymic mood, full affect     EKG:  Done today and reviewed by myself: ***  05/28/2020  TTE IMPRESSIONS  1. Left ventricular ejection fraction, by estimation, is 45 to 50%. The  left ventricle has mildly decreased function. The left ventricle  demonstrates global hypokinesis. There is moderate concentric left  ventricular hypertrophy. Left ventricular  diastolic parameters are indeterminate due to underlying atrial  fibrillation.  2. Right ventricular systolic function is mildly reduced. The right  ventricular size is normal. Tricuspid regurgitation signal is inadequate  for assessing PA pressure.  3. Left atrial size was mildly dilated.  4. The mitral valve is normal in structure. Trivial mitral valve  regurgitation. No evidence of mitral stenosis.  5. The aortic valve is tricuspid. Aortic valve regurgitation is not  visualized. Mild aortic valve sclerosis is present, with no evidence of  aortic valve stenosis.  6. The inferior vena cava is normal in size with greater than 50%  respiratory variability, suggesting right atrial pressure of 3 mmHg.    09/29/2018: TTE Study Conclusions - Left ventricle: The cavity size was normal. Wall thickness was   increased in a pattern of mild LVH. Systolic function was normal.   The estimated ejection fraction was in the range of 55% to 60%.   Wall motion was normal; there were no regional wall motion   abnormalities. Features are consistent with a pseudonormal left   ventricular filling pattern, with concomitant abnormal relaxation   and increased filling pressure (grade 2 diastolic dysfunction).  03/03/2018: TTE Study Conclusions - Left ventricle: The cavity size was normal. There was moderate   concentric hypertrophy. Systolic function was severely reduced.   The estimated ejection fraction was in the range of 15% to 20%.   Diffuse hypokinesis. - Aortic valve: There was no regurgitation. - Aortic root: The aortic root was normal in size. - Mitral valve: There was mild regurgitation. - Left atrium: The atrium was  moderately dilated. - Right ventricle: Systolic function was normal. - Right atrium: The atrium was mildly dilated. - Tricuspid valve: There was mild regurgitation. - Pulmonary arteries: Systolic pressure was within the normal   range. - Inferior vena cava: The vessel was normal in size. - Pericardium, extracardiac: There was no pericardial effusion.  Impressions: - Images with echocontrast show no thrombus in the left ventricle   but there is significant smoke.    10/18/16 TTE Study Conclusions - Left ventricle: The cavity size was mildly dilated. There was   moderate concentric hypertrophy. Systolic function was severely   reduced. The estimated ejection fraction was 25%. Diffuse   hypokinesis. The study is not technically sufficient to allow   evaluation of LV diastolic function. - Mitral valve: Calcified annulus. Mildly thickened leaflets .   There was mild regurgitation. - Left atrium: The atrium was severely dilated. (47mm) - Right ventricle: Systolic function was moderately reduced. - Right atrium: The atrium was mildly dilated. - Tricuspid valve: There was mild regurgitation.   05/29/13: Echocardiogram Study Conclusions Left ventricle: LVEF is approximately 50 to 55% with very mild inferior and septal hypokinesis  02/08/13: Echocardiogram Study Conclusions - Left ventricle: The cavity size was mildly dilated. Wall thickness was increased in a pattern of moderate LVH. Systolic function was severely reduced. The estimated ejection fraction was in the range of 25% to 30%. - Left atrium: The atrium was mildly dilated.  Recent Labs: 02/26/2020: Hemoglobin 11.2; Platelets 250 03/20/2020: BUN 24; Creatinine, Ser 1.33; Potassium 3.7; Sodium 138 05/03/2020: TSH 3.140  No results found for requested labs within last 8760 hours.   CrCl cannot be calculated (Patient's most recent lab result is older than the maximum 21 days allowed.).   Wt Readings  from Last 3  Encounters:  06/25/20 267 lb (121.1 kg)  05/03/20 266 lb (120.7 kg)  04/05/20 275 lb (124.7 kg)     Other studies reviewed: Additional studies/records reviewed today include: summarized above  ASSESSMENT AND PLAN:  1. Persistent AFib     CHADS2Vasc is at least 3 on Pradaxa, *** appropriately dosed by last labs     *** plan for Tikosyn?  2. HTN     No changes today     Discussed diet, exercise weight loss   3. NICM    *** Last echo looked better       ***  On BB/Entresto, diuretic    *** No clear exam findings of volume OL currently         Disposition: ***   Current medicines are reviewed at length with the patient today.  The patient did not have any concerns regarding medicines.  Haywood Lasso, PA-C 09/03/2020 11:08 AM     [facility] [place] [address] [phone] (office)  [phone] (fax)

## 2020-09-04 ENCOUNTER — Encounter (HOSPITAL_COMMUNITY): Payer: Self-pay | Admitting: Physician Assistant

## 2020-09-04 ENCOUNTER — Telehealth: Payer: Self-pay | Admitting: Pharmacist

## 2020-09-04 ENCOUNTER — Ambulatory Visit (HOSPITAL_COMMUNITY)
Admission: RE | Admit: 2020-09-04 | Discharge: 2020-09-04 | Disposition: A | Discharge status: Home / Self Care | Payer: Medicare Other | Source: Ambulatory Visit | Attending: Physician Assistant | Admitting: Physician Assistant

## 2020-09-04 ENCOUNTER — Other Ambulatory Visit (HOSPITAL_COMMUNITY): Payer: Self-pay | Admitting: *Deleted

## 2020-09-04 ENCOUNTER — Other Ambulatory Visit: Payer: Self-pay

## 2020-09-04 ENCOUNTER — Ambulatory Visit: Payer: Medicare Other | Admitting: Physician Assistant

## 2020-09-04 ENCOUNTER — Encounter (HOSPITAL_COMMUNITY): Payer: Self-pay

## 2020-09-04 VITALS — BP 102/70 | HR 95 | Ht 63.0 in | Wt 268.4 lb

## 2020-09-04 DIAGNOSIS — Z888 Allergy status to other drugs, medicaments and biological substances status: Secondary | ICD-10-CM | POA: Diagnosis not present

## 2020-09-04 DIAGNOSIS — Z7901 Long term (current) use of anticoagulants: Secondary | ICD-10-CM | POA: Insufficient documentation

## 2020-09-04 DIAGNOSIS — Z79899 Other long term (current) drug therapy: Secondary | ICD-10-CM | POA: Diagnosis not present

## 2020-09-04 DIAGNOSIS — Z6841 Body Mass Index (BMI) 40.0 and over, adult: Secondary | ICD-10-CM | POA: Diagnosis not present

## 2020-09-04 DIAGNOSIS — I428 Other cardiomyopathies: Secondary | ICD-10-CM | POA: Insufficient documentation

## 2020-09-04 DIAGNOSIS — E669 Obesity, unspecified: Secondary | ICD-10-CM | POA: Insufficient documentation

## 2020-09-04 DIAGNOSIS — Z7984 Long term (current) use of oral hypoglycemic drugs: Secondary | ICD-10-CM | POA: Insufficient documentation

## 2020-09-04 DIAGNOSIS — I10 Essential (primary) hypertension: Secondary | ICD-10-CM | POA: Insufficient documentation

## 2020-09-04 DIAGNOSIS — I4819 Other persistent atrial fibrillation: Secondary | ICD-10-CM | POA: Diagnosis not present

## 2020-09-04 DIAGNOSIS — D869 Sarcoidosis, unspecified: Secondary | ICD-10-CM | POA: Insufficient documentation

## 2020-09-04 DIAGNOSIS — E785 Hyperlipidemia, unspecified: Secondary | ICD-10-CM | POA: Diagnosis not present

## 2020-09-04 DIAGNOSIS — Z794 Long term (current) use of insulin: Secondary | ICD-10-CM | POA: Diagnosis not present

## 2020-09-04 DIAGNOSIS — D6869 Other thrombophilia: Secondary | ICD-10-CM | POA: Diagnosis not present

## 2020-09-04 LAB — BASIC METABOLIC PANEL
Anion gap: 10 (ref 5–15)
BUN: 19 mg/dL (ref 8–23)
CO2: 25 mmol/L (ref 22–32)
Calcium: 9.6 mg/dL (ref 8.9–10.3)
Chloride: 103 mmol/L (ref 98–111)
Creatinine, Ser: 1.37 mg/dL — ABNORMAL HIGH (ref 0.44–1.00)
GFR, Estimated: 42 mL/min — ABNORMAL LOW (ref >60)
Glucose, Bld: 246 mg/dL — ABNORMAL HIGH (ref 70–99)
Potassium: 4.1 mmol/L (ref 3.5–5.1)
Sodium: 138 mmol/L (ref 135–145)

## 2020-09-04 LAB — MAGNESIUM: Magnesium: 2.2 mg/dL (ref 1.7–2.4)

## 2020-09-04 NOTE — Telephone Encounter (Signed)
Medication list reviewed in anticipation of upcoming Tikosyn initiation. Patient is taking hydroxyzine and trazodone which are QTc prolonging. Would see if pt can discuss stopping these meds with her PCP (note from PA today states that pt is not using hydroxyzine but still on med list, trazodone is listed prn for sleep so hoping both are easy for pt to d/c).  Patient is anticoagulated on Pradaxa 150mg  BID on the appropriate dose. Please ensure that patient has not missed any anticoagulation doses in the 3 weeks prior to Tikosyn initiation.   Patient will need to be counseled to avoid use of Benadryl while on Tikosyn and in the 2-3 days prior to Tikosyn initiation.

## 2020-09-04 NOTE — Progress Notes (Signed)
Primary Care Physician: Delrae Rend, MD Primary Electrophysiologist: Dr [name] Referring Physician: Dr [name]   Stephanie [name] is a 69 y.o. female with a history of HTN, tachycardia mediated CM, HLD, sarcoidosis, and persistent atrial fibrillation who presents for follow up in the [facility]. The patient was initially diagnosed with atrial fibrillation remotely. She has had DCCV in 2014, 2018, and 03/04/20. She was seen for afib ablation consideration in 2015 but opted not to proceed. Previously maintained on amiodarone but this was stopped 2/2 tremors and hyperthyroidism. Patient is on Pradaxa for a CHADS2VASC score of 4. She was seen 04/05/20 after DCCV and was back in afib. Repeat echo showed decreased EF of 45-50% and dofetilide was recommended. Unfortunately, dofetilide admission were temporarily on hold 2/2 increase in COVID-19 hospitalizations. Today, patient reports she has symptoms of fatigue but is otherwise unaware of her arrhythmia.   Today, she denies symptoms of palpitations, chest pain, shortness of breath, orthopnea, PND, lower extremity edema, dizziness, presyncope, syncope, snoring, daytime somnolence, bleeding, or neurologic sequela. The patient is tolerating medications without difficulties and is otherwise without complaint today.    Atrial Fibrillation Risk Factors:  she does not have symptoms or diagnosis of sleep apnea. she does not have a history of rheumatic fever. she does not have a history of alcohol use.   she has a BMI of Body mass index is 47.54 kg/m.Marland Kitchen Filed Weights   09/04/20 1505  Weight: 121.7 kg    Family History  Problem Relation Age of Onset  . Pneumonia Father        deceased  . Hypertension Father   . Cancer Father   . Multiple sclerosis Mother        hx  . Emphysema Other        runs in the family     Atrial Fibrillation Management history:  Previous antiarrhythmic drugs: amiodarone Previous  cardioversions: 2014, 2018, 03/04/20 Previous ablations: none CHADS2VASC score: 4 Anticoagulation history: Pradaxa    Past Medical History:  Diagnosis Date  . Anemia   . Ankle fracture, right    x 2  . Antineoplastic and immunosuppressive drugs causing adverse effect in therapeutic use   . Asthma   . Chronic venous hypertension without complications   . Colon polyps 2009  . Coronary artery disease    no CAD by cath 11.2010  . Depressive disorder, not elsewhere classified   . Diabetes mellitus type 2 in obese (HCC)    hgb AIC 6.1 09/02/2009, insulin dependent   . Diastolic CHF, chronic (HCC)    reports stable  . Diverticulosis of colon 2009   colonoscopy  . Gout    cortisone  shots helped  . Hepatitis, unspecified    hx of/per pt, never was told of having hepatitis  . Hx of hysterectomy 2002  . Hyperlipidemia   . Hypertension    controlled on medication   . Hypothyroidism    reports her thryoid levels are normal now   . Irritable bowel syndrome   . Lung nodule    5mm RLL nodule on CT Chest  07/2009, resolved by 2011 CT  . Noncompliance   . Obesity   . Persistent atrial fibrillation ([place])    s/p DCCV 07/2009; Pradaxa Rx, states she is in sinus rhythm now   . PONV (postoperative nausea and vomiting)    after receiving "too much anesthesia" after a hernia surgery   . Sarcoidosis    dx by eye  exam; seen during eye exam , report asymtptomatic "i dont have it now'   . Sleep disturbance 06/2013   sleep study, mild abnormality, no criteria for CPAP   Past Surgical History:  Procedure Laterality Date  . ABDOMINAL HYSTERECTOMY    . CARDIOVERSION     x 2  . CARDIOVERSION N/A 02/27/2013   Procedure: CARDIOVERSION;  Surgeon: Lelon Perla, MD;  Location: [facility] ENDOSCOPY;  Service: Cardiovascular;  Laterality: N/A;  . CARDIOVERSION N/A 03/24/2017   Procedure: CARDIOVERSION;  Surgeon: Josue Hector, MD;  Location: [facility] ENDOSCOPY;  Service: Cardiovascular;  Laterality: N/A;  .  CARDIOVERSION N/A 03/04/2020   Procedure: CARDIOVERSION;  Surgeon: Josue Hector, MD;  Location: [facility] ENDOSCOPY;  Service: Cardiovascular;  Laterality: N/A;  . CHOLECYSTECTOMY  early 18s  . COLONOSCOPY     Diverticulosis, colon polyps, repeat 2014, Dr. Deatra Ina  . COLONOSCOPY WITH PROPOFOL N/A 07/25/2019   Procedure: COLONOSCOPY WITH PROPOFOL;  Surgeon: Yetta Flock, MD;  Location: [facility] ENDOSCOPY;  Service: Gastroenterology;  Laterality: N/A;  . ESOPHAGOGASTRODUODENOSCOPY N/A 05/16/2014   Procedure: ESOPHAGOGASTRODUODENOSCOPY (EGD);  Surgeon: Wonda Horner, MD;  Location: [facility] ORS;  Service: Gastroenterology;  Laterality: N/A;  . HERNIA REPAIR  [id number]   umbilical hernia  . MYOMECTOMY  2000  . POLYPECTOMY  07/25/2019   Procedure: POLYPECTOMY;  Surgeon: Yetta Flock, MD;  Location: [facility] ENDOSCOPY;  Service: Gastroenterology;;    Current Outpatient Medications  Medication Sig Dispense Refill  . carvedilol (COREG) 25 MG tablet TAKE 1 TABLET(25 MG) BY MOUTH TWICE DAILY 180 tablet 3  . cholecalciferol (VITAMIN D3) 25 MCG (1000 UNIT) tablet Take 1,000 Units by mouth daily.    . colchicine 0.6 MG tablet Take 1 tablet (0.6 mg total) by mouth daily. 10 tablet 0  . ENTRESTO 24-26 MG TAKE 1 TABLET BY MOUTH TWICE DAILY 60 tablet 11  . furosemide (LASIX) 40 MG tablet TAKE 1.5 TABLETS BY MOUTH DAILY, IF AN ADDITIONAL WEIGHT GAIN OF 2-3 POUNDS TAKE 1 EXTRA TABLET 180 tablet 3  . hydrOXYzine (ATARAX/VISTARIL) 10 MG tablet Take 10 mg by mouth at bedtime.    Marland Kitchen ibuprofen (ADVIL) 200 MG tablet Take 200 mg by mouth every 6 (six) hours as needed for mild pain or moderate pain.    . Insulin Pen Needle (NOVOFINE) 30G X 8 MM MISC For Humalog meal time TID 100 each 11  . JARDIANCE 10 MG TABS tablet Take 10 mg by mouth daily.    . metFORMIN (GLUCOPHAGE) 500 MG tablet Take 500 mg by mouth 2 (two) times daily.     . Multiple Vitamins-Minerals (ECHINACEA ACZ PO) Take 1 tablet by mouth daily.    Marland Kitchen NOVOLOG FLEXPEN 100  UNIT/ML FlexPen Inject 15 Units into the skin daily.     Marland Kitchen PRADAXA 150 MG CAPS capsule TAKE 1 CAPSULE BY MOUTH TWICE DAILY 60 capsule 8  . simvastatin (ZOCOR) 10 MG tablet Take 10 mg by mouth daily.     Marland Kitchen spironolactone (ALDACTONE) 25 MG tablet TAKE 1 TABLET(25 MG) BY MOUTH DAILY 90 tablet 3  . traZODone (DESYREL) 50 MG tablet Take 0.5-1 tablets (25-50 mg total) by mouth at bedtime as needed for sleep. 30 tablet 3   No current facility-administered medications for this encounter.    Allergies  Allergen Reactions  . Amiodarone Other (See Comments)    adverse reaction: Tremor/gait disurbance  . Aspirin Other (See Comments)  . Methimazole Other (See Comments)  . Propoxyphene Nausea And Vomiting  .  Albuterol Palpitations    Palpitations, intolerance  . Atorvastatin Other (See Comments)    Body aches and pains--stiffness.   [name] [Pitavastatin]     Body aches  . Propoxyphene N-Acetaminophen Nausea And Vomiting    Social History   Socioeconomic History  . Marital status: Single    Spouse name: Not on file  . Number of children: 5  . Years of education: college  . Highest education level: Not on file  Occupational History  . Occupation: Disabled.  Tobacco Use  . Smoking status: Never Smoker  . Smokeless tobacco: Never Used  Vaping Use  . Vaping Use: Never used  Substance and Sexual Activity  . Alcohol use: Yes    Alcohol/week: 0.0 standard drinks    Comment: rare  . Drug use: No  . Sexual activity: Not Currently    Birth control/protection: Surgical  Other Topics Concern  . Not on file  Social History Narrative   Pt lives in [place] with her son.  Unemployed.  Right handed.     Education college   Social Determinants of Health   Financial Resource Strain:   . Difficulty of Paying Living Expenses: Not on file  Food Insecurity:   . Worried About Charity fundraiser in the Last Year: Not on file  . Ran Out of Food in the Last Year: Not on file  Transportation  Needs:   . Lack of Transportation (Medical): Not on file  . Lack of Transportation (Non-Medical): Not on file  Physical Activity:   . Days of Exercise per Week: Not on file  . Minutes of Exercise per Session: Not on file  Stress:   . Feeling of Stress : Not on file  Social Connections:   . Frequency of Communication with Friends and Family: Not on file  . Frequency of Social Gatherings with Friends and Family: Not on file  . Attends Religious Services: Not on file  . Active Member of Clubs or Organizations: Not on file  . Attends Archivist Meetings: Not on file  . Marital Status: Not on file  Intimate Partner Violence:   . Fear of Current or Ex-Partner: Not on file  . Emotionally Abused: Not on file  . Physically Abused: Not on file  . Sexually Abused: Not on file     ROS- All systems are reviewed and negative except as per the HPI above.  Physical Exam: Vitals:   09/04/20 1505  BP: 102/70  Pulse: 95  Weight: 121.7 kg  Height: 5\' 3"  (1.6 m)    GEN- The patient is well appearing obese female, alert and oriented x 3 today.   Head- normocephalic, atraumatic Eyes-  Sclera clear, conjunctiva pink Ears- hearing intact Oropharynx- clear Neck- supple  Lungs- Clear to ausculation bilaterally, normal work of breathing Heart- irregular rate and rhythm, no murmurs, rubs or gallops  GI- soft, NT, ND, + BS Extremities- no clubbing, cyanosis, or edema MS- no significant deformity or atrophy Skin- no rash or lesion Psych- euthymic mood, full affect Neuro- strength and sensation are intact  Wt Readings from Last 3 Encounters:  09/04/20 121.7 kg  06/25/20 121.1 kg  05/03/20 120.7 kg    EKG today demonstrates afib HR 95, PVC, QRS 104, QTc 477  Echo 05/28/20 demonstrated  1. Left ventricular ejection fraction, by estimation, is 45 to 50%. The  left ventricle has mildly decreased function. The left ventricle  demonstrates global hypokinesis. There is moderate  concentric left  ventricular  hypertrophy. Left ventricular  diastolic parameters are indeterminate due to underlying atrial  fibrillation.  2. Right ventricular systolic function is mildly reduced. The right  ventricular size is normal. Tricuspid regurgitation signal is inadequate  for assessing PA pressure.  3. Left atrial size was mildly dilated.  4. The mitral valve is normal in structure. Trivial mitral valve  regurgitation. No evidence of mitral stenosis.  5. The aortic valve is tricuspid. Aortic valve regurgitation is not  visualized. Mild aortic valve sclerosis is present, with no evidence of  aortic valve stenosis.  6. The inferior vena cava is normal in size with greater than 50%  respiratory variability, suggesting right atrial pressure of 3 mmHg.   Epic records are reviewed at length today  CHA2DS2-VASc Score = 4  The patient's score is based upon: CHF History: 1 HTN History: 1 Diabetes History: 0 Stroke History: 0 Vascular Disease History: 0 Age Score: 1 Gender Score: 1      ASSESSMENT AND PLAN: 1. Persistent Atrial Fibrillation (ICD10:  I48.19) The patient's CHA2DS2-VASc score is 4, indicating a 4.8% annual risk of stroke.   Patient wants to pursue dofetilide, aware of risk vrs benefit Patient will check on price of medication. Patient will continue on Pradaxa 150 mg BID PharmD to screen medications for QT prolonging drugs. She is not taking hydroxyzine. QTC in SR 420 ms Check bmet/mag today. Continue Coreg 25 mg BID  2. Secondary Hypercoagulable State (ICD10:  D68.69) The patient is at significant risk for stroke/thromboembolism based upon her CHA2DS2-VASc Score of 4.  Continue Dabigatran (Pradaxa).   3. Obesity Body mass index is 47.54 kg/m. Lifestyle modification was discussed at length including regular exercise and weight reduction.  4. NICM Suspected tachycardia mediated. EF decreased to 45-50% on last echo. No signs or symptoms of fluid  overload. Hopefully this will improved with restoration of SR.  5. HTN Stable, no changes today.   Follow up for dofetilide admission 12/6.   [facility] [address] [place], [place] [address] [phone] 09/04/2020 3:11 PM

## 2020-09-09 DIAGNOSIS — Z23 Encounter for immunization: Secondary | ICD-10-CM | POA: Diagnosis not present

## 2020-09-16 NOTE — Addendum Note (Signed)
Addended by: Juluis Mire on: 09/16/2020 04:45 PM   Modules accepted: Orders

## 2020-09-16 NOTE — Telephone Encounter (Signed)
Patient states she does not take hydroxyzine. She does take Trazodone PRN - she will call her PCP to have it switched to another medication. She will call back with update of new medication.

## 2020-09-20 ENCOUNTER — Other Ambulatory Visit (HOSPITAL_COMMUNITY)
Admission: RE | Admit: 2020-09-20 | Discharge: 2020-09-20 | Disposition: A | Discharge status: Home / Self Care | Payer: Medicare Other | Source: Ambulatory Visit | Attending: Physician Assistant | Admitting: Physician Assistant

## 2020-09-20 DIAGNOSIS — Z20822 Contact with and (suspected) exposure to covid-19: Secondary | ICD-10-CM | POA: Insufficient documentation

## 2020-09-20 DIAGNOSIS — Z01812 Encounter for preprocedural laboratory examination: Secondary | ICD-10-CM | POA: Insufficient documentation

## 2020-09-21 LAB — SARS CORONAVIRUS 2 (TAT 6-24 HRS): SARS Coronavirus 2: NEGATIVE

## 2020-09-23 ENCOUNTER — Encounter (HOSPITAL_COMMUNITY): Payer: Self-pay | Admitting: Internal Medicine

## 2020-09-23 ENCOUNTER — Inpatient Hospital Stay (HOSPITAL_COMMUNITY)
Admission: AD | Admit: 2020-09-23 | Discharge: 2020-09-25 | DRG: 309 | Disposition: A | Discharge status: Home / Self Care | Payer: Medicare Other | Source: Ambulatory Visit | Attending: Internal Medicine | Admitting: Internal Medicine

## 2020-09-23 ENCOUNTER — Other Ambulatory Visit: Payer: Self-pay

## 2020-09-23 ENCOUNTER — Ambulatory Visit (HOSPITAL_COMMUNITY)
Admission: RE | Admit: 2020-09-23 | Discharge: 2020-09-23 | Disposition: A | Discharge status: Home / Self Care | Payer: Medicare Other | Source: Ambulatory Visit | Attending: Physician Assistant | Admitting: Physician Assistant

## 2020-09-23 ENCOUNTER — Encounter (HOSPITAL_COMMUNITY): Payer: Self-pay | Admitting: Physician Assistant

## 2020-09-23 VITALS — BP 114/68 | HR 96 | Ht 63.0 in | Wt 270.2 lb

## 2020-09-23 DIAGNOSIS — I110 Hypertensive heart disease with heart failure: Secondary | ICD-10-CM | POA: Diagnosis not present

## 2020-09-23 DIAGNOSIS — Z820 Family history of epilepsy and other diseases of the nervous system: Secondary | ICD-10-CM

## 2020-09-23 DIAGNOSIS — F32A Depression, unspecified: Secondary | ICD-10-CM | POA: Diagnosis present

## 2020-09-23 DIAGNOSIS — I5032 Chronic diastolic (congestive) heart failure: Secondary | ICD-10-CM | POA: Diagnosis present

## 2020-09-23 DIAGNOSIS — Z79899 Other long term (current) drug therapy: Secondary | ICD-10-CM

## 2020-09-23 DIAGNOSIS — M109 Gout, unspecified: Secondary | ICD-10-CM | POA: Diagnosis present

## 2020-09-23 DIAGNOSIS — Z20822 Contact with and (suspected) exposure to covid-19: Secondary | ICD-10-CM | POA: Diagnosis not present

## 2020-09-23 DIAGNOSIS — Z8719 Personal history of other diseases of the digestive system: Secondary | ICD-10-CM

## 2020-09-23 DIAGNOSIS — J45909 Unspecified asthma, uncomplicated: Secondary | ICD-10-CM | POA: Diagnosis present

## 2020-09-23 DIAGNOSIS — Z886 Allergy status to analgesic agent status: Secondary | ICD-10-CM | POA: Diagnosis not present

## 2020-09-23 DIAGNOSIS — D869 Sarcoidosis, unspecified: Secondary | ICD-10-CM | POA: Diagnosis not present

## 2020-09-23 DIAGNOSIS — E785 Hyperlipidemia, unspecified: Secondary | ICD-10-CM | POA: Diagnosis present

## 2020-09-23 DIAGNOSIS — Z8249 Family history of ischemic heart disease and other diseases of the circulatory system: Secondary | ICD-10-CM

## 2020-09-23 DIAGNOSIS — J439 Emphysema, unspecified: Secondary | ICD-10-CM | POA: Diagnosis present

## 2020-09-23 DIAGNOSIS — D6869 Other thrombophilia: Secondary | ICD-10-CM

## 2020-09-23 DIAGNOSIS — Z888 Allergy status to other drugs, medicaments and biological substances status: Secondary | ICD-10-CM | POA: Diagnosis not present

## 2020-09-23 DIAGNOSIS — E119 Type 2 diabetes mellitus without complications: Secondary | ICD-10-CM | POA: Diagnosis present

## 2020-09-23 DIAGNOSIS — E669 Obesity, unspecified: Secondary | ICD-10-CM | POA: Diagnosis not present

## 2020-09-23 DIAGNOSIS — I428 Other cardiomyopathies: Secondary | ICD-10-CM | POA: Diagnosis not present

## 2020-09-23 DIAGNOSIS — Z6841 Body Mass Index (BMI) 40.0 and over, adult: Secondary | ICD-10-CM

## 2020-09-23 DIAGNOSIS — I4819 Other persistent atrial fibrillation: Principal | ICD-10-CM | POA: Diagnosis present

## 2020-09-23 DIAGNOSIS — Z7901 Long term (current) use of anticoagulants: Secondary | ICD-10-CM | POA: Diagnosis not present

## 2020-09-23 DIAGNOSIS — Z794 Long term (current) use of insulin: Secondary | ICD-10-CM

## 2020-09-23 LAB — BASIC METABOLIC PANEL
Anion gap: 12 (ref 5–15)
BUN: 21 mg/dL (ref 8–23)
CO2: 20 mmol/L — ABNORMAL LOW (ref 22–32)
Calcium: 9.1 mg/dL (ref 8.9–10.3)
Chloride: 104 mmol/L (ref 98–111)
Creatinine, Ser: 1.36 mg/dL — ABNORMAL HIGH (ref 0.44–1.00)
GFR, Estimated: 42 mL/min — ABNORMAL LOW (ref >60)
Glucose, Bld: 216 mg/dL — ABNORMAL HIGH (ref 70–99)
Potassium: 3.9 mmol/L (ref 3.5–5.1)
Sodium: 136 mmol/L (ref 135–145)

## 2020-09-23 LAB — MAGNESIUM: Magnesium: 2 mg/dL (ref 1.7–2.4)

## 2020-09-23 MED ORDER — POTASSIUM CHLORIDE CRYS ER 20 MEQ PO TBCR
40.0000 meq | EXTENDED_RELEASE_TABLET | Freq: Once | ORAL | Status: AC
  Administered 2020-09-23: 40 meq via ORAL
  Filled 2020-09-23: qty 2

## 2020-09-23 MED ORDER — MAGNESIUM SULFATE 2 GM/50ML IV SOLN
2.0000 g | Freq: Once | INTRAVENOUS | Status: AC
  Administered 2020-09-23: 2 g via INTRAVENOUS
  Filled 2020-09-23: qty 50

## 2020-09-23 MED ORDER — SIMVASTATIN 5 MG PO TABS
10.0000 mg | ORAL_TABLET | Freq: Every day | ORAL | Status: DC
  Filled 2020-09-23: qty 2
  Filled 2020-09-23: qty 1

## 2020-09-23 MED ORDER — INSULIN PEN NEEDLE 30G X 8 MM MISC: 1.0000 | Status: DC | PRN

## 2020-09-23 MED ORDER — CARVEDILOL 25 MG PO TABS: 25.0000 mg | ORAL_TABLET | Freq: Two times a day (BID) | ORAL | Status: DC

## 2020-09-23 MED ORDER — FUROSEMIDE 40 MG PO TABS: 40.0000 mg | ORAL_TABLET | Freq: Every day | ORAL | Status: DC

## 2020-09-23 MED ORDER — METFORMIN HCL 500 MG PO TABS: 500.0000 mg | ORAL_TABLET | Freq: Two times a day (BID) | ORAL | Status: DC

## 2020-09-23 MED ORDER — DOFETILIDE 500 MCG PO CAPS
500.0000 ug | ORAL_CAPSULE | Freq: Two times a day (BID) | ORAL | Status: DC
  Administered 2020-09-23: 500 ug via ORAL
  Filled 2020-09-23: qty 1

## 2020-09-23 MED ORDER — SODIUM CHLORIDE 0.9% FLUSH
3.0000 mL | Freq: Two times a day (BID) | INTRAVENOUS | Status: DC
  Administered 2020-09-23 – 2020-09-25 (×3): 3 mL via INTRAVENOUS

## 2020-09-23 MED ORDER — SACUBITRIL-VALSARTAN 24-26 MG PO TABS: 1.0000 | ORAL_TABLET | Freq: Two times a day (BID) | ORAL | Status: DC

## 2020-09-23 MED ORDER — SPIRONOLACTONE 25 MG PO TABS: 25.0000 mg | ORAL_TABLET | Freq: Every day | ORAL | Status: DC

## 2020-09-23 MED ORDER — INSULIN ASPART 100 UNIT/ML ~~LOC~~ SOLN
13.0000 [IU] | Freq: Two times a day (BID) | SUBCUTANEOUS | Status: DC
  Administered 2020-09-24 – 2020-09-25 (×4): 13 [IU] via SUBCUTANEOUS

## 2020-09-23 MED ORDER — EMPAGLIFLOZIN 10 MG PO TABS
10.0000 mg | ORAL_TABLET | Freq: Every day | ORAL | Status: DC
  Filled 2020-09-23 (×2): qty 1

## 2020-09-23 MED ORDER — INSULIN ASPART 100 UNIT/ML FLEXPEN: 15.0000 [IU] | PEN_INJECTOR | Freq: Every day | SUBCUTANEOUS | Status: DC

## 2020-09-23 MED ORDER — DIPHENHYDRAMINE HCL 25 MG PO CAPS
25.0000 mg | ORAL_CAPSULE | Freq: Once | ORAL | Status: AC
  Administered 2020-09-23: 25 mg via ORAL
  Filled 2020-09-23: qty 1

## 2020-09-23 MED ORDER — SODIUM CHLORIDE 0.9 % IV SOLN
250.0000 mL | INTRAVENOUS | Status: DC | PRN
  Administered 2020-09-23: 250 mL via INTRAVENOUS

## 2020-09-23 MED ORDER — SODIUM CHLORIDE 0.9% FLUSH: 3.0000 mL | INTRAVENOUS | Status: DC | PRN

## 2020-09-23 MED ORDER — DABIGATRAN ETEXILATE MESYLATE 150 MG PO CAPS
150.0000 mg | ORAL_CAPSULE | Freq: Two times a day (BID) | ORAL | Status: DC
  Filled 2020-09-23 (×2): qty 1

## 2020-09-23 NOTE — Progress Notes (Signed)
Pharmacy: Dofetilide (Tikosyn) - Initial Consult Assessment and Electrolyte Replacement  Pharmacy consulted to assist in monitoring and replacing electrolytes in this 69 y.o. female admitted on 09/23/2020 undergoing dofetilide initiation. First dofetilide dose: 500 mcg PO Q 12 hrs.  Assessment:  Patient Exclusion Criteria: If any screening criteria checked as "Yes", then  patient  should NOT receive dofetilide until criteria item is corrected.  If "Yes" please indicate correction plan.  YES  NO Patient  Exclusion Criteria Correction Plan   [x]   []   Baseline QTc interval is greater than or equal to 440 msec. IF above YES box checked dofetilide contraindicated unless patient has ICD; then may proceed if QTc 500-550 msec or with known ventricular conduction abnormalities may proceed with QTc 550-600 msec. QTc = 472 msec (in a fib here) QTc = 420 msec (in NSR in office pre admission) N/A - QTc in NSR was 420 msec at cardiology office PTA   []   [x]   Patient is known or suspected to have a digoxin level greater than 2 ng/ml: No results found for: DIGOXIN     []   [x]   Creatinine clearance less than 20 ml/min (calculated using Cockcroft-Gault, actual body weight and serum creatinine): Estimated Creatinine Clearance: 49.9 mL/min (A) (by C-G formula based on SCr of 1.36 mg/dL (H)).  TBW CrCl = 75 ml/min     []   [x]  Patient has received drugs known to prolong the QT intervals within the last 48 hours (phenothiazines, tricyclics or tetracyclic antidepressants, erythromycin, H-1 antihistamines, cisapride, fluoroquinolones, azithromycin, ondansetron).   Updated information on QT prolonging agents is available to be searched on the following database:QT prolonging agents     []   [x]   Patient received a dose of hydrochlorothiazide (Oretic) alone or in any combination including triamterene (Dyazide, Maxzide) in the last 48 hours.    []   [x]  Patient received a medication known to increase  dofetilide plasma concentrations prior to initial dofetilide dose:  . Trimethoprim (Primsol, Proloprim) in the last 36 hours . Verapamil (Calan, Verelan) in the last 36 hours or a sustained release dose in the last 72 hours . Megestrol (Megace) in the last 5 days  . Cimetidine (Tagamet) in the last 6 hours . Ketoconazole (Nizoral) in the last 24 hours . Itraconazole (Sporanox) in the last 48 hours  . Prochlorperazine (Compazine) in the last 36 hours     []   [x]   Patient is known to have a history of torsades de pointes; congenital or acquired long QT syndromes.    []   [x]   Patient has received a Class 1 antiarrhythmic with less than 2 half-lives since last dose. (Disopyramide, Quinidine, Procainamide, Lidocaine, Mexiletine, Flecainide, Propafenone)    []   [x]   Patient has received amiodarone therapy in the past 3 months or amiodarone level is greater than 0.3 ng/ml.    Patient has been appropriately anticoagulated with Pradaxa.  Labs:    Component Value Date/Time   K 3.9 09/23/2020 1133   K 3.8 09/15/2016 1441   MG 2.0 09/23/2020 1133     Plan: Potassium: K 3.8-3.9:  Hold Tikosyn initiation and give KCl 40 mEq po x1 then begin Tikosyn at least 2hr after KCl dose - do not need to recheck K   Magnesium: Mg 1.8-2: Give Mg 2 gm IV x1 to prevent Mg from dropping below 1.8 - do not need to recheck Mg. Appropriate to initiate Tikosyn  Thank you for allowing pharmacy to participate in this patient's care   Gillermina Hu,  PharmD, BCPS, [facility] Clinical Pharmacist 09/23/2020  9:10 PM

## 2020-09-23 NOTE — Progress Notes (Signed)
Primary Care Physician: No primary care provider on file. Primary Electrophysiologist: Dr [name] Referring Physician: Dr [name]   Stephanie [name] is a 69 y.o. female with a history of HTN, tachycardia mediated CM, HLD, sarcoidosis, and persistent atrial fibrillation who presents for follow up in the [facility]. The patient was initially diagnosed with atrial fibrillation remotely. She has had DCCV in 2014, 2018, and 03/04/20. She was seen for afib ablation consideration in 2015 but opted not to proceed. Previously maintained on amiodarone but this was stopped 2/2 tremors and hyperthyroidism. Patient is on Pradaxa for a CHADS2VASC score of 4. She was seen 04/05/20 after DCCV and was back in afib. Repeat echo showed decreased EF of 45-50% and dofetilide was recommended. Unfortunately, dofetilide admission were temporarily on hold 2/2 increase in COVID-19 hospitalizations.   On follow up today, patient presents for dofetilide admission. Patient reports she has done reasonably well since her last visit. She does report fatigue but is otherwise unaware of her arrhythmia. She denies any missed doses of anticoagulation in the last 3 weeks.   Today, she denies symptoms of palpitations, chest pain, shortness of breath, orthopnea, PND, lower extremity edema, dizziness, presyncope, syncope, snoring, daytime somnolence, bleeding, or neurologic sequela. The patient is tolerating medications without difficulties and is otherwise without complaint today.    Atrial Fibrillation Risk Factors:  she does not have symptoms or diagnosis of sleep apnea. she does not have a history of rheumatic fever. she does not have a history of alcohol use.   she has a BMI of Body mass index is 47.86 kg/m.Marland Kitchen Filed Weights   09/23/20 1142  Weight: 122.6 kg    Family History  Problem Relation Age of Onset  . Pneumonia Father        deceased  . Hypertension Father   . Cancer Father   . Multiple  sclerosis Mother        hx  . Emphysema Other        runs in the family     Atrial Fibrillation Management history:  Previous antiarrhythmic drugs: amiodarone Previous cardioversions: 2014, 2018, 03/04/20 Previous ablations: none CHADS2VASC score: 4 Anticoagulation history: Pradaxa    Past Medical History:  Diagnosis Date  . Anemia   . Ankle fracture, right    x 2  . Antineoplastic and immunosuppressive drugs causing adverse effect in therapeutic use   . Asthma   . Chronic venous hypertension without complications   . Colon polyps 2009  . Coronary artery disease    no CAD by cath 11.2010  . Depressive disorder, not elsewhere classified   . Diabetes mellitus type 2 in obese (HCC)    hgb AIC 6.1 09/02/2009, insulin dependent   . Diastolic CHF, chronic (HCC)    reports stable  . Diverticulosis of colon 2009   colonoscopy  . Gout    cortisone  shots helped  . Hepatitis, unspecified    hx of/per pt, never was told of having hepatitis  . Hx of hysterectomy 2002  . Hyperlipidemia   . Hypertension    controlled on medication   . Hypothyroidism    reports her thryoid levels are normal now   . Irritable bowel syndrome   . Lung nodule    18mm RLL nodule on CT Chest  07/2009, resolved by 2011 CT  . Noncompliance   . Obesity   . Persistent atrial fibrillation ([place])    s/p DCCV 07/2009; Pradaxa Rx, states she is in  sinus rhythm now   . PONV (postoperative nausea and vomiting)    after receiving "too much anesthesia" after a hernia surgery   . Sarcoidosis    dx by eye exam; seen during eye exam , report asymtptomatic "i dont have it now'   . Sleep disturbance 06/2013   sleep study, mild abnormality, no criteria for CPAP   Past Surgical History:  Procedure Laterality Date  . ABDOMINAL HYSTERECTOMY    . CARDIOVERSION     x 2  . CARDIOVERSION N/A 02/27/2013   Procedure: CARDIOVERSION;  Surgeon: Lelon Perla, MD;  Location: [facility] ENDOSCOPY;  Service: Cardiovascular;   Laterality: N/A;  . CARDIOVERSION N/A 03/24/2017   Procedure: CARDIOVERSION;  Surgeon: Josue Hector, MD;  Location: [facility] ENDOSCOPY;  Service: Cardiovascular;  Laterality: N/A;  . CARDIOVERSION N/A 03/04/2020   Procedure: CARDIOVERSION;  Surgeon: Josue Hector, MD;  Location: [facility] ENDOSCOPY;  Service: Cardiovascular;  Laterality: N/A;  . CHOLECYSTECTOMY  early 63s  . COLONOSCOPY     Diverticulosis, colon polyps, repeat 2014, Dr. Deatra Ina  . COLONOSCOPY WITH PROPOFOL N/A 07/25/2019   Procedure: COLONOSCOPY WITH PROPOFOL;  Surgeon: Yetta Flock, MD;  Location: [facility] ENDOSCOPY;  Service: Gastroenterology;  Laterality: N/A;  . ESOPHAGOGASTRODUODENOSCOPY N/A 05/16/2014   Procedure: ESOPHAGOGASTRODUODENOSCOPY (EGD);  Surgeon: Wonda Horner, MD;  Location: [facility] ORS;  Service: Gastroenterology;  Laterality: N/A;  . HERNIA REPAIR  [id number]   umbilical hernia  . MYOMECTOMY  2000  . POLYPECTOMY  07/25/2019   Procedure: POLYPECTOMY;  Surgeon: Yetta Flock, MD;  Location: [facility] ENDOSCOPY;  Service: Gastroenterology;;    Current Outpatient Medications  Medication Sig Dispense Refill  . carvedilol (COREG) 25 MG tablet TAKE 1 TABLET(25 MG) BY MOUTH TWICE DAILY 180 tablet 3  . cholecalciferol (VITAMIN D3) 25 MCG (1000 UNIT) tablet Take 1,000 Units by mouth daily.    . colchicine 0.6 MG tablet Take 1 tablet (0.6 mg total) by mouth daily. 10 tablet 0  . ENTRESTO 24-26 MG TAKE 1 TABLET BY MOUTH TWICE DAILY 60 tablet 11  . furosemide (LASIX) 40 MG tablet TAKE 1.5 TABLETS BY MOUTH DAILY, IF AN ADDITIONAL WEIGHT GAIN OF 2-3 POUNDS TAKE 1 EXTRA TABLET 180 tablet 3  . ibuprofen (ADVIL) 200 MG tablet Take 200 mg by mouth every 6 (six) hours as needed for mild pain or moderate pain.    . Insulin Pen Needle (NOVOFINE) 30G X 8 MM MISC For Humalog meal time TID 100 each 11  . JARDIANCE 10 MG TABS tablet Take 10 mg by mouth daily.    . metFORMIN (GLUCOPHAGE) 500 MG tablet Take 500 mg by mouth 2 (two) times daily.     .  Multiple Vitamins-Minerals (ECHINACEA ACZ PO) Take 1 tablet by mouth daily.    Marland Kitchen NOVOLOG FLEXPEN 100 UNIT/ML FlexPen Inject 15 Units into the skin daily.     Marland Kitchen PRADAXA 150 MG CAPS capsule TAKE 1 CAPSULE BY MOUTH TWICE DAILY 60 capsule 8  . simvastatin (ZOCOR) 10 MG tablet Take 10 mg by mouth daily.     Marland Kitchen spironolactone (ALDACTONE) 25 MG tablet TAKE 1 TABLET(25 MG) BY MOUTH DAILY 90 tablet 3   No current facility-administered medications for this encounter.    Allergies  Allergen Reactions  . Amiodarone Other (See Comments)    adverse reaction: Tremor/gait disurbance  . Aspirin Other (See Comments)  . Methimazole Other (See Comments)  . Propoxyphene Nausea And Vomiting  . Albuterol Palpitations    Palpitations, intolerance  .  Atorvastatin Other (See Comments)    Body aches and pains--stiffness.   [name] [Pitavastatin]     Body aches  . Propoxyphene N-Acetaminophen Nausea And Vomiting    Social History   Socioeconomic History  . Marital status: Single    Spouse name: Not on file  . Number of children: 5  . Years of education: college  . Highest education level: Not on file  Occupational History  . Occupation: Disabled.  Tobacco Use  . Smoking status: Never Smoker  . Smokeless tobacco: Never Used  Vaping Use  . Vaping Use: Never used  Substance and Sexual Activity  . Alcohol use: Yes    Alcohol/week: 0.0 standard drinks    Comment: rare  . Drug use: No  . Sexual activity: Not Currently    Birth control/protection: Surgical  Other Topics Concern  . Not on file  Social History Narrative   Pt lives in [place] with her son.  Unemployed.  Right handed.     Education college   Social Determinants of Health   Financial Resource Strain:   . Difficulty of Paying Living Expenses: Not on file  Food Insecurity:   . Worried About Charity fundraiser in the Last Year: Not on file  . Ran Out of Food in the Last Year: Not on file  Transportation Needs:   . Lack of  Transportation (Medical): Not on file  . Lack of Transportation (Non-Medical): Not on file  Physical Activity:   . Days of Exercise per Week: Not on file  . Minutes of Exercise per Session: Not on file  Stress:   . Feeling of Stress : Not on file  Social Connections:   . Frequency of Communication with Friends and Family: Not on file  . Frequency of Social Gatherings with Friends and Family: Not on file  . Attends Religious Services: Not on file  . Active Member of Clubs or Organizations: Not on file  . Attends Archivist Meetings: Not on file  . Marital Status: Not on file  Intimate Partner Violence:   . Fear of Current or Ex-Partner: Not on file  . Emotionally Abused: Not on file  . Physically Abused: Not on file  . Sexually Abused: Not on file     ROS- All systems are reviewed and negative except as per the HPI above.  Physical Exam: Vitals:   09/23/20 1142  BP: 114/68  Pulse: 96  Weight: 122.6 kg  Height: 5\' 3"  (1.6 m)    GEN- The patient is well appearing obese female, alert and oriented x 3 today.   HEENT-head normocephalic, atraumatic, sclera clear, conjunctiva pink, hearing intact, trachea midline. Lungs- Clear to ausculation bilaterally, normal work of breathing Heart- irregular rate and rhythm, no murmurs, rubs or gallops  GI- soft, NT, ND, + BS Extremities- no clubbing, cyanosis, or edema MS- no significant deformity or atrophy Skin- no rash or lesion Psych- euthymic mood, full affect Neuro- strength and sensation are intact   Wt Readings from Last 3 Encounters:  09/23/20 122.6 kg  09/04/20 121.7 kg  06/25/20 121.1 kg    EKG today demonstrates afib HR 96, QRS 114, QTc 472  Echo 05/28/20 demonstrated  1. Left ventricular ejection fraction, by estimation, is 45 to 50%. The  left ventricle has mildly decreased function. The left ventricle  demonstrates global hypokinesis. There is moderate concentric left  ventricular hypertrophy. Left  ventricular  diastolic parameters are indeterminate due to underlying atrial  fibrillation.  2. Right ventricular systolic function is mildly reduced. The right  ventricular size is normal. Tricuspid regurgitation signal is inadequate  for assessing PA pressure.  3. Left atrial size was mildly dilated.  4. The mitral valve is normal in structure. Trivial mitral valve  regurgitation. No evidence of mitral stenosis.  5. The aortic valve is tricuspid. Aortic valve regurgitation is not  visualized. Mild aortic valve sclerosis is present, with no evidence of  aortic valve stenosis.  6. The inferior vena cava is normal in size with greater than 50%  respiratory variability, suggesting right atrial pressure of 3 mmHg.   Epic records are reviewed at length today  CHA2DS2-VASc Score = 4  The patient's score is based upon: CHF History: 1 HTN History: 1 Diabetes History: 0 Stroke History: 0 Vascular Disease History: 0 Age Score: 1 Gender Score: 1      ASSESSMENT AND PLAN: 1. Persistent Atrial Fibrillation (ICD10:  I48.19) The patient's CHA2DS2-VASc score is 4, indicating a 4.8% annual risk of stroke.   Patient wants to pursue dofetilide, aware of risk vrs benefit Patient has checked on price of dofetilide. Patient will continue on Pradaxa 150 mg BID. She denies any missed doses in the last 3 weeks. PharmD has screened medications for QT prolonging drugs. She is not taking hydroxyzine or trazodone.  QTC in SR 420 ms Check bmet/mag today. Continue Coreg 25 mg BID Labs today show creatinine at 1.36, K+ 3.9 and mag 2.0, CrCl calculated at 11mL/min  2. Secondary Hypercoagulable State (ICD10:  D68.69) The patient is at significant risk for stroke/thromboembolism based upon her CHA2DS2-VASc Score of 4.  Continue Dabigatran (Pradaxa).   3. Obesity Body mass index is 47.86 kg/m. Lifestyle modification was discussed and encouraged including regular physical activity and weight  reduction.  4. NICM Suspected tachycardia mediated. EF decreased to 45-50% on last echo. No signs or symptoms of fluid overload. Hopefully this will improve with maintenance of SR.  5. HTN Stable, no changes today.   To be admitted later today once a bed becomes available.    [facility] [address] [place], [place] [address] [phone] 09/23/2020 11:53 AM

## 2020-09-24 ENCOUNTER — Ambulatory Visit: Payer: Medicare Other | Admitting: Family Medicine

## 2020-09-24 DIAGNOSIS — I4819 Other persistent atrial fibrillation: Principal | ICD-10-CM

## 2020-09-24 LAB — BASIC METABOLIC PANEL
Anion gap: 8 (ref 5–15)
BUN: 19 mg/dL (ref 8–23)
CO2: 23 mmol/L (ref 22–32)
Calcium: 9.1 mg/dL (ref 8.9–10.3)
Chloride: 107 mmol/L (ref 98–111)
Creatinine, Ser: 1.17 mg/dL — ABNORMAL HIGH (ref 0.44–1.00)
GFR, Estimated: 51 mL/min — ABNORMAL LOW (ref >60)
Glucose, Bld: 181 mg/dL — ABNORMAL HIGH (ref 70–99)
Potassium: 4.1 mmol/L (ref 3.5–5.1)
Sodium: 138 mmol/L (ref 135–145)

## 2020-09-24 LAB — GLUCOSE, CAPILLARY
Glucose-Capillary: 180 mg/dL — ABNORMAL HIGH (ref 70–99)
Glucose-Capillary: 103 mg/dL — ABNORMAL HIGH (ref 70–99)
Glucose-Capillary: 157 mg/dL — ABNORMAL HIGH (ref 70–99)
Glucose-Capillary: 111 mg/dL — ABNORMAL HIGH (ref 70–99)

## 2020-09-24 LAB — HIV ANTIBODY (ROUTINE TESTING W REFLEX): HIV Screen 4th Generation wRfx: NONREACTIVE

## 2020-09-24 LAB — MAGNESIUM: Magnesium: 2.5 mg/dL — ABNORMAL HIGH (ref 1.7–2.4)

## 2020-09-24 MED ORDER — METFORMIN HCL 500 MG PO TABS
500.0000 mg | ORAL_TABLET | Freq: Two times a day (BID) | ORAL | Status: DC
  Administered 2020-09-24 – 2020-09-25 (×4): 500 mg via ORAL
  Filled 2020-09-24 (×4): qty 1

## 2020-09-24 MED ORDER — CARVEDILOL 25 MG PO TABS
25.0000 mg | ORAL_TABLET | Freq: Two times a day (BID) | ORAL | Status: DC
  Administered 2020-09-24 – 2020-09-25 (×3): 25 mg via ORAL
  Filled 2020-09-24 (×4): qty 1

## 2020-09-24 MED ORDER — SACUBITRIL-VALSARTAN 24-26 MG PO TABS
1.0000 | ORAL_TABLET | Freq: Two times a day (BID) | ORAL | Status: DC
  Administered 2020-09-24 – 2020-09-25 (×3): 1 via ORAL
  Filled 2020-09-24 (×3): qty 1

## 2020-09-24 MED ORDER — DOFETILIDE 250 MCG PO CAPS
250.0000 ug | ORAL_CAPSULE | Freq: Two times a day (BID) | ORAL | Status: DC
  Administered 2020-09-24: 250 ug via ORAL
  Filled 2020-09-24: qty 1

## 2020-09-24 MED ORDER — SPIRONOLACTONE 25 MG PO TABS
25.0000 mg | ORAL_TABLET | Freq: Every day | ORAL | Status: DC
  Administered 2020-09-24 – 2020-09-25 (×2): 25 mg via ORAL
  Filled 2020-09-24 (×2): qty 1

## 2020-09-24 MED ORDER — SIMVASTATIN 20 MG PO TABS
10.0000 mg | ORAL_TABLET | Freq: Every day | ORAL | Status: DC
  Administered 2020-09-24 – 2020-09-25 (×3): 10 mg via ORAL
  Filled 2020-09-24 (×2): qty 1

## 2020-09-24 MED ORDER — DABIGATRAN ETEXILATE MESYLATE 150 MG PO CAPS
150.0000 mg | ORAL_CAPSULE | Freq: Two times a day (BID) | ORAL | Status: DC
  Administered 2020-09-24 – 2020-09-25 (×3): 150 mg via ORAL
  Filled 2020-09-24 (×5): qty 1

## 2020-09-24 MED ORDER — FUROSEMIDE 40 MG PO TABS
40.0000 mg | ORAL_TABLET | Freq: Every day | ORAL | Status: DC
  Administered 2020-09-24 – 2020-09-25 (×2): 40 mg via ORAL
  Filled 2020-09-24 (×2): qty 1

## 2020-09-24 MED ORDER — ACETAMINOPHEN 325 MG PO TABS
650.0000 mg | ORAL_TABLET | Freq: Four times a day (QID) | ORAL | Status: DC | PRN
  Administered 2020-09-24: 650 mg via ORAL
  Filled 2020-09-24: qty 2

## 2020-09-24 MED ORDER — EMPAGLIFLOZIN 10 MG PO TABS
10.0000 mg | ORAL_TABLET | Freq: Every day | ORAL | Status: DC
  Administered 2020-09-24 – 2020-09-25 (×2): 10 mg via ORAL
  Filled 2020-09-24 (×2): qty 1

## 2020-09-24 NOTE — Progress Notes (Signed)
QTc at 534. [place], PA made aware.

## 2020-09-24 NOTE — Progress Notes (Signed)
Pharmacy: Dofetilide (Tikosyn) - Follow Up Assessment and Electrolyte Replacement  Pharmacy consulted to assist in monitoring and replacing electrolytes in this 69 y.o. female admitted on 09/23/2020 undergoing dofetilide initiation. First dofetilide dose: 09/23/20 pm.  Labs:    Component Value Date/Time   K 4.1 09/24/2020 0326   K 3.8 09/15/2016 1441   MG 2.5 (H) 09/24/2020 0326     Plan: Potassium: K >/= 4: No additional supplementation needed  Magnesium: Mg > 2: No additional supplementation needed   Thank you for allowing pharmacy to participate in this patient's care   Arrie Senate, PharmD, BCPS, [place] Pharmacist [phone] Please check AMION for all [place] numbers 09/24/2020

## 2020-09-24 NOTE — H&P (Signed)
Primary Care Physician: No primary care provider on file. Primary Electrophysiologist: Dr [name] Referring Physician: Dr [name]   Stephanie [name] is a 69 y.o. female with a history of HTN, tachycardia mediated CM, HLD, sarcoidosis, and persistent atrial fibrillation who presents for follow up in the [facility]. The patient was initially diagnosed with atrial fibrillation remotely. She has had DCCV in 2014, 2018, and 03/04/20. She was seen for afib ablation consideration in 2015 but opted not to proceed. Previously maintained on amiodarone but this was stopped 2/2 tremors and hyperthyroidism. Patient is on Pradaxa for a CHADS2VASC score of 4. She was seen 04/05/20 after DCCV and was back in afib. Repeat echo showed decreased EF of 45-50% and dofetilide was recommended. Unfortunately, dofetilide admission were temporarily on hold 2/2 increase in COVID-19 hospitalizations.   On follow up today, patient presents for dofetilide admission. Patient reports she has done reasonably well since her last visit. She does report fatigue but is otherwise unaware of her arrhythmia. She denies any missed doses of anticoagulation in the last 3 weeks.   Today, she denies symptoms of palpitations, chest pain, shortness of breath, orthopnea, PND, lower extremity edema, dizziness, presyncope, syncope, snoring, daytime somnolence, bleeding, or neurologic sequela. The patient is tolerating medications without difficulties and is otherwise without complaint today.    Atrial Fibrillation Risk Factors:  she does not have symptoms or diagnosis of sleep apnea. she does not have a history of rheumatic fever. she does not have a history of alcohol use.   she has a BMI of Body mass index is 47.86 kg/m.Marland Kitchen    Filed Weights   09/23/20 1142  Weight: 122.6 kg         Family History  Problem Relation Age of Onset  . Pneumonia Father        deceased  . Hypertension Father   .  Cancer Father   . Multiple sclerosis Mother        hx  . Emphysema Other        runs in the family     Atrial Fibrillation Management history:  Previous antiarrhythmic drugs: amiodarone Previous cardioversions: 2014, 2018, 03/04/20 Previous ablations: none CHADS2VASC score: 4 Anticoagulation history: Pradaxa        Past Medical History:  Diagnosis Date  . Anemia   . Ankle fracture, right    x 2  . Antineoplastic and immunosuppressive drugs causing adverse effect in therapeutic use   . Asthma   . Chronic venous hypertension without complications   . Colon polyps 2009  . Coronary artery disease    no CAD by cath 11.2010  . Depressive disorder, not elsewhere classified   . Diabetes mellitus type 2 in obese (HCC)    hgb AIC 6.1 09/02/2009, insulin dependent   . Diastolic CHF, chronic (HCC)    reports stable  . Diverticulosis of colon 2009   colonoscopy  . Gout    cortisone  shots helped  . Hepatitis, unspecified    hx of/per pt, never was told of having hepatitis  . Hx of hysterectomy 2002  . Hyperlipidemia   . Hypertension    controlled on medication   . Hypothyroidism    reports her thryoid levels are normal now   . Irritable bowel syndrome   . Lung nodule    54mm RLL nodule on CT Chest  07/2009, resolved by 2011 CT  . Noncompliance   . Obesity   . Persistent atrial fibrillation ([place])  s/p DCCV 07/2009; Pradaxa Rx, states she is in sinus rhythm now   . PONV (postoperative nausea and vomiting)    after receiving "too much anesthesia" after a hernia surgery   . Sarcoidosis    dx by eye exam; seen during eye exam , report asymtptomatic "i dont have it now'   . Sleep disturbance 06/2013   sleep study, mild abnormality, no criteria for CPAP        Past Surgical History:  Procedure Laterality Date  . ABDOMINAL HYSTERECTOMY    . CARDIOVERSION     x 2  . CARDIOVERSION N/A 02/27/2013   Procedure:  CARDIOVERSION;  Surgeon: Lelon Perla, MD;  Location: [facility] ENDOSCOPY;  Service: Cardiovascular;  Laterality: N/A;  . CARDIOVERSION N/A 03/24/2017   Procedure: CARDIOVERSION;  Surgeon: Josue Hector, MD;  Location: [facility] ENDOSCOPY;  Service: Cardiovascular;  Laterality: N/A;  . CARDIOVERSION N/A 03/04/2020   Procedure: CARDIOVERSION;  Surgeon: Josue Hector, MD;  Location: [facility] ENDOSCOPY;  Service: Cardiovascular;  Laterality: N/A;  . CHOLECYSTECTOMY  early 37s  . COLONOSCOPY     Diverticulosis, colon polyps, repeat 2014, Dr. Deatra Ina  . COLONOSCOPY WITH PROPOFOL N/A 07/25/2019   Procedure: COLONOSCOPY WITH PROPOFOL;  Surgeon: Yetta Flock, MD;  Location: [facility] ENDOSCOPY;  Service: Gastroenterology;  Laterality: N/A;  . ESOPHAGOGASTRODUODENOSCOPY N/A 05/16/2014   Procedure: ESOPHAGOGASTRODUODENOSCOPY (EGD);  Surgeon: Wonda Horner, MD;  Location: [facility] ORS;  Service: Gastroenterology;  Laterality: N/A;  . HERNIA REPAIR  5003   umbilical hernia  . MYOMECTOMY  2000  . POLYPECTOMY  07/25/2019   Procedure: POLYPECTOMY;  Surgeon: Yetta Flock, MD;  Location: [facility] ENDOSCOPY;  Service: Gastroenterology;;          Current Outpatient Medications  Medication Sig Dispense Refill  . carvedilol (COREG) 25 MG tablet TAKE 1 TABLET(25 MG) BY MOUTH TWICE DAILY 180 tablet 3  . cholecalciferol (VITAMIN D3) 25 MCG (1000 UNIT) tablet Take 1,000 Units by mouth daily.    . colchicine 0.6 MG tablet Take 1 tablet (0.6 mg total) by mouth daily. 10 tablet 0  . ENTRESTO 24-26 MG TAKE 1 TABLET BY MOUTH TWICE DAILY 60 tablet 11  . furosemide (LASIX) 40 MG tablet TAKE 1.5 TABLETS BY MOUTH DAILY, IF AN ADDITIONAL WEIGHT GAIN OF 2-3 POUNDS TAKE 1 EXTRA TABLET 180 tablet 3  . ibuprofen (ADVIL) 200 MG tablet Take 200 mg by mouth every 6 (six) hours as needed for mild pain or moderate pain.    . Insulin Pen Needle (NOVOFINE) 30G X 8 MM MISC For Humalog meal time TID 100 each 11  . JARDIANCE 10 MG TABS  tablet Take 10 mg by mouth daily.    . metFORMIN (GLUCOPHAGE) 500 MG tablet Take 500 mg by mouth 2 (two) times daily.     . Multiple Vitamins-Minerals (ECHINACEA ACZ PO) Take 1 tablet by mouth daily.    Marland Kitchen NOVOLOG FLEXPEN 100 UNIT/ML FlexPen Inject 15 Units into the skin daily.     Marland Kitchen PRADAXA 150 MG CAPS capsule TAKE 1 CAPSULE BY MOUTH TWICE DAILY 60 capsule 8  . simvastatin (ZOCOR) 10 MG tablet Take 10 mg by mouth daily.     Marland Kitchen spironolactone (ALDACTONE) 25 MG tablet TAKE 1 TABLET(25 MG) BY MOUTH DAILY 90 tablet 3   No current facility-administered medications for this encounter.    Allergies  Allergen Reactions  . Amiodarone Other (See Comments)    adverse reaction: Tremor/gait disurbance  . Aspirin Other (See Comments)  .  Methimazole Other (See Comments)  . Propoxyphene Nausea And Vomiting  . Albuterol Palpitations    Palpitations, intolerance  . Atorvastatin Other (See Comments)    Body aches and pains--stiffness.   [name] [Pitavastatin]     Body aches  . Propoxyphene N-Acetaminophen Nausea And Vomiting    Social History        Socioeconomic History  . Marital status: Single    Spouse name: Not on file  . Number of children: 5  . Years of education: college  . Highest education level: Not on file  Occupational History  . Occupation: Disabled.  Tobacco Use  . Smoking status: Never Smoker  . Smokeless tobacco: Never Used  Vaping Use  . Vaping Use: Never used  Substance and Sexual Activity  . Alcohol use: Yes    Alcohol/week: 0.0 standard drinks    Comment: rare  . Drug use: No  . Sexual activity: Not Currently    Birth control/protection: Surgical  Other Topics Concern  . Not on file  Social History Narrative   Pt lives in [place] with her son.  Unemployed.  Right handed.     Education college   Social Determinants of Health      Financial Resource Strain:   . Difficulty of Paying Living Expenses: Not on file  Food  Insecurity:   . Worried About Charity fundraiser in the Last Year: Not on file  . Ran Out of Food in the Last Year: Not on file  Transportation Needs:   . Lack of Transportation (Medical): Not on file  . Lack of Transportation (Non-Medical): Not on file  Physical Activity:   . Days of Exercise per Week: Not on file  . Minutes of Exercise per Session: Not on file  Stress:   . Feeling of Stress : Not on file  Social Connections:   . Frequency of Communication with Friends and Family: Not on file  . Frequency of Social Gatherings with Friends and Family: Not on file  . Attends Religious Services: Not on file  . Active Member of Clubs or Organizations: Not on file  . Attends Archivist Meetings: Not on file  . Marital Status: Not on file  Intimate Partner Violence:   . Fear of Current or Ex-Partner: Not on file  . Emotionally Abused: Not on file  . Physically Abused: Not on file  . Sexually Abused: Not on file     ROS- All systems are reviewed and negative except as per the HPI above.  Physical Exam:    Vitals:   09/23/20 1142  BP: 114/68  Pulse: 96  Weight: 122.6 kg  Height: 5\' 3"  (1.6 m)    GEN- The patient is well appearing obese female, alert and oriented x 3 today.   HEENT-head normocephalic, atraumatic, sclera clear, conjunctiva pink, hearing intact, trachea midline. Lungs- Clear to ausculation bilaterally, normal work of breathing Heart- irregular rate and rhythm, no murmurs, rubs or gallops  GI- soft, NT, ND, + BS Extremities- no clubbing, cyanosis, or edema MS- no significant deformity or atrophy Skin- no rash or lesion Psych- euthymic mood, full affect Neuro- strength and sensation are intact      Wt Readings from Last 3 Encounters:  09/23/20 122.6 kg  09/04/20 121.7 kg  06/25/20 121.1 kg    EKG today demonstrates afib HR 96, QRS 114, QTc 472  Echo 05/28/20 demonstrated  1. Left ventricular ejection fraction, by estimation, is  45 to 50%. The  left ventricle has mildly decreased function. The left ventricle  demonstrates global hypokinesis. There is moderate concentric left  ventricular hypertrophy. Left ventricular  diastolic parameters are indeterminate due to underlying atrial  fibrillation.  2. Right ventricular systolic function is mildly reduced. The right  ventricular size is normal. Tricuspid regurgitation signal is inadequate  for assessing PA pressure.  3. Left atrial size was mildly dilated.  4. The mitral valve is normal in structure. Trivial mitral valve  regurgitation. No evidence of mitral stenosis.  5. The aortic valve is tricuspid. Aortic valve regurgitation is not  visualized. Mild aortic valve sclerosis is present, with no evidence of  aortic valve stenosis.  6. The inferior vena cava is normal in size with greater than 50%  respiratory variability, suggesting right atrial pressure of 3 mmHg.   Epic records are reviewed at length today  CHA2DS2-VASc Score = 4  The patient's score is based upon: CHF History: 1 HTN History: 1 Diabetes History: 0 Stroke History: 0 Vascular Disease History: 0 Age Score: 1 Gender Score: 1      ASSESSMENT AND PLAN: 1. Persistent Atrial Fibrillation (ICD10:  I48.19) The patient's CHA2DS2-VASc score is 4, indicating a 4.8% annual risk of stroke.   Patient wants to pursue dofetilide, aware of risk vrs benefit Patient has checked on price of dofetilide. Patient will continue on Pradaxa 150 mg BID. She denies any missed doses in the last 3 weeks. PharmD has screened medications for QT prolonging drugs. She is not taking hydroxyzine or trazodone.  QTC in SR 420 ms Check bmet/mag today. Continue Coreg 25 mg BID Labs today show creatinine at 1.36, K+ 3.9 and mag 2.0, CrCl calculated at 12mL/min  2. Secondary Hypercoagulable State (ICD10:  D68.69) The patient is at significant risk for stroke/thromboembolism based upon her CHA2DS2-VASc Score of 4.   Continue Dabigatran (Pradaxa).   3. Obesity Body mass index is 47.86 kg/m. Lifestyle modification was discussed and encouraged including regular physical activity and weight reduction.  4. NICM Suspected tachycardia mediated. EF decreased to 45-50% on last echo. No signs or symptoms of fluid overload. Hopefully this will improve with maintenance of SR.  5. HTN Stable, no changes today.   To be admitted later today once a bed becomes available.    [facility] [address] [place], [place] [address] [phone] 09/23/2020 11:53 AM Pt admitted after I had gone home   Pt not seen by me until the am 12/7

## 2020-09-24 NOTE — Progress Notes (Signed)
Morning EKG reviewed  Shows remains in NSR at 64 bpm with prolonged QTc at >520 ms despite dose decrease to Tikosyn 250 mcg BID.   Discussed with Dr. [name] and reviewed EKG with him.  QTc prolongation significant enough despite dose decrease to declare patient high risk patient and deem Tikosyn as failure.   Will not proceed with tikosyn dosing with prolonged QT.   Amiodarone previously stopped due to tremors and hypothyroidism on chronic dosing as low as 200 mg M-F and 100 mg daily.   Will monitor patient at least overnight for QT prolongation, and further discuss plan/disposition in the AM.  Discussed with patient.   Shirley Friar, PA-C  Pager: [phone]  09/24/2020 4:07 PM

## 2020-09-24 NOTE — Progress Notes (Addendum)
Electrophysiology Rounding Note  Patient Name: Stephanie [name] Date of Encounter: 09/24/2020  Primary Cardiologist: No primary care provider on file.  Electrophysiologist: [name], MD    Subjective   Pt converted to sinus rhythm on Tikosyn 500 mcg BID   QTc from EKG last pm shows prolonged QTc at ~520 ms  The patient is doing well today.  At this time, the patient denies chest pain, shortness of breath, or any new concerns.  Inpatient Medications    Scheduled Meds:  carvedilol  25 mg Oral BID WC   dabigatran  150 mg Oral Q12H   dofetilide  250 mcg Oral BID   empagliflozin  10 mg Oral Daily   furosemide  40 mg Oral Daily   insulin aspart  13 Units Subcutaneous BID WC   metFORMIN  500 mg Oral BID WC   sacubitril-valsartan  1 tablet Oral BID   simvastatin  10 mg Oral Daily   sodium chloride flush  3 mL Intravenous Q12H   spironolactone  25 mg Oral Daily   Continuous Infusions:  sodium chloride 10 mL/hr at 09/24/20 0410   PRN Meds: sodium chloride, sodium chloride flush   Vital Signs    Vitals:   09/23/20 2049 09/23/20 2052 09/24/20 0421 09/24/20 0733  BP:  127/72 115/63 129/69  Pulse:  93 92 76  Resp:  14 19 16   Temp:  98.2 F (36.8 C) 97.6 F (36.4 C) 98.4 F (36.9 C)  TempSrc:  Oral Oral Oral  SpO2:  100% 98% 98%  Weight: 121.1 kg  121.4 kg   Height: 5\' 3"  (1.6 m)       Intake/Output Summary (Last 24 hours) at 09/24/2020 0806 Last data filed at 09/24/2020 0410 Gross per 24 hour  Intake 533.55 ml  Output --  Net 533.55 ml   Filed Weights   09/23/20 2049 09/24/20 0421  Weight: 121.1 kg 121.4 kg    Physical Exam    GEN- The patient is well appearing, alert and oriented x 3 today.   Head- normocephalic, atraumatic Eyes-  Sclera clear, conjunctiva pink Ears- hearing intact Oropharynx- clear Neck- supple Lungs- Clear to ausculation bilaterally, normal work of breathing Heart- Regular rate and rhythm, no murmurs, rubs or gallops GI- soft, NT,  ND, + BS Extremities- no clubbing, cyanosis, or edema Skin- no rash or lesion Psych- euthymic mood, full affect Neuro- strength and sensation are intact  Labs    CBC No results for input(s): WBC, NEUTROABS, HGB, HCT, MCV, PLT in the last 72 hours. Basic Metabolic Panel Recent Labs    09/23/20 1133 09/24/20 0326  NA 136 138  K 3.9 4.1  CL 104 107  CO2 20* 23  GLUCOSE 216* 181*  BUN 21 19  CREATININE 1.36* 1.17*  CALCIUM 9.1 9.1  MG 2.0 2.5*    Potassium  Date/Time Value Ref Range Status  09/24/2020 03:26 AM 4.1 3.5 - 5.1 mmol/L Final  09/15/2016 02:41 PM 3.8 3.5 - 5.1 mEq/L Final   Magnesium  Date/Time Value Ref Range Status  09/24/2020 03:26 AM 2.5 (H) 1.7 - 2.4 mg/dL Final    Comment:    Performed at [facility], [place] [address], [place], [place] [address]    Telemetry    Pt arrived in AF but converted to NSR shortly after first dose (personally reviewed)  Radiology    No results found.   Patient Profile     Stephanie [name] is a 69 y.o. female with a  past medical history significant for persistent atrial fibrillation.  They were admitted for tikosyn load.   Assessment & Plan    Persistent atrial fibrillation Pt converted to sinus rhythm on Tikosyn 500 mcg BID  Continue  dabigatran Electrolytes stable.  CHA2DS2VASC is at least 5. QTc is prolonged (confounded by administration of Benadryl) Discussed with Dr. [name]. Will reduce dose to 250 mcg and follow QTc.   2. NICM ? Tachy mediated. Hope for improvement in NSR Most recent echo LVEF 45-50%  Pt will not require [employer] as she converted after first dose.   For questions or updates, please contact [place] Please consult [email or web address] for contact info under Cardiology/STEMI.  Signed, Shirley Friar, PA-C  09/24/2020, 8:06 AM    Pt seen and examined  Converted to sinus on dofetilide but ECG >> QT prolongation Will reduce dose to 249mcg

## 2020-09-24 NOTE — Plan of Care (Signed)
  Problem: Education: Goal: Knowledge of disease or condition will improve Outcome: Progressing Goal: Understanding of medication regimen will improve Outcome: Progressing   Problem: Education: Goal: Knowledge of General Education information will improve Description: Including pain rating scale, medication(s)/side effects and non-pharmacologic comfort measures Outcome: Progressing

## 2020-09-25 LAB — MAGNESIUM: Magnesium: 2.3 mg/dL (ref 1.7–2.4)

## 2020-09-25 LAB — BASIC METABOLIC PANEL
Anion gap: 10 (ref 5–15)
BUN: 21 mg/dL (ref 8–23)
CO2: 24 mmol/L (ref 22–32)
Calcium: 9.4 mg/dL (ref 8.9–10.3)
Chloride: 103 mmol/L (ref 98–111)
Creatinine, Ser: 1.25 mg/dL — ABNORMAL HIGH (ref 0.44–1.00)
GFR, Estimated: 47 mL/min — ABNORMAL LOW (ref >60)
Glucose, Bld: 178 mg/dL — ABNORMAL HIGH (ref 70–99)
Potassium: 4.3 mmol/L (ref 3.5–5.1)
Sodium: 137 mmol/L (ref 135–145)

## 2020-09-25 LAB — GLUCOSE, CAPILLARY
Glucose-Capillary: 187 mg/dL — ABNORMAL HIGH (ref 70–99)
Glucose-Capillary: 104 mg/dL — ABNORMAL HIGH (ref 70–99)
Glucose-Capillary: 143 mg/dL — ABNORMAL HIGH (ref 70–99)

## 2020-09-25 MED ORDER — DRONEDARONE HCL 400 MG PO TABS
400.0000 mg | ORAL_TABLET | Freq: Two times a day (BID) | ORAL | Status: DC
  Filled 2020-09-25: qty 1

## 2020-09-25 MED ORDER — DRONEDARONE HCL 400 MG PO TABS: 400.0000 mg | ORAL_TABLET | Freq: Two times a day (BID) | ORAL | 3 refills | Status: DC

## 2020-09-25 NOTE — TOC Benefit Eligibility Note (Signed)
Transition of Care [facility]) Benefit Eligibility Note    Patient Details  Name: Zaidee Rion MRN: [id number] Date of Birth: 09-08-1951   Medication/Dose: MULTAQ  400 MG BID  Covered?: Yes  Tier:  (4 DRUG)  Prescription Coverage Preferred Pharmacy: Roseanne Kaufman with Person/Company/Phone Number:: [facility]  @ [facility] RX / [facility] DX #  [phone] OPT- MEMBER  Co-Pay: ZERO DOLLARS     Deductible:  (CATASTROPHIC PHASE)       Memory Argue Phone Number: 09/25/2020, 5:18 PM

## 2020-09-25 NOTE — Discharge Summary (Signed)
ELECTROPHYSIOLOGY PROCEDURE DISCHARGE SUMMARY    Patient ID: Stephanie [name],  MRN: [id number], DOB/AGE: 1951/10/13 69 y.o.  Admit date: 09/23/2020 Discharge date: 09/25/2020  Primary Care Physician: Lilian Coma., MD  Primary Cardiologist: No primary care provider on file.  Electrophysiologist: [name], MD   Primary Discharge Diagnosis:  1.  Persistent atrial fibrillation status post Tikosyn loading attempt this admission  Secondary Discharge Diagnosis:  2. Prolonged QT 3. HLD 4. Sarcoidosis 5. HTN  Allergies  Allergen Reactions  . Amiodarone Other (See Comments)    adverse reaction: Tremor/gait disurbance  . Aspirin Other (See Comments)    "Burns my stomach"  . Methimazole Nausea And Vomiting  . Propoxyphene Nausea And Vomiting  . Albuterol Palpitations  . Atorvastatin Other (See Comments)    Body aches and pains--stiffness.   [name] [Pitavastatin]     Body aches     Procedures This Admission:  1.  Tikosyn loading -> QTc prolongation led to down titration and ultimately cessation  Brief HPI: Stephanie [name] is a 69 y.o. female with a past medical history as noted above.  They were referred to EP in the outpatient setting for treatment options of atrial fibrillation.  Risks, benefits, and alternatives to Tikosyn were reviewed with the patient who wished to proceed.    Hospital Course:  The patient was admitted and Tikosyn was initiated.  Renal function and electrolytes were followed during the hospitalization.  Their QTc remained prolonged after the first dose of 500 mcg, and did not improve after down titration to 250 mcg.  This was stopped and thought risk outweighed benefits of attempting 125 mcg. She has previously had tremors and hypothryoidism on amiodarone and is thought to be poor candidate for EP procedures due to BMI. Discussed at length with Dr. [name] and will attempt multaq if cost not prohibitive. Pt was noted to have converted to sinus rhythm  after initial dose of tikosyn, and remained in NSR up to discharge despite cessation. On the day of discharge, they were examined by Dr. [name]  who considered them stable for discharge to home.   We will try her on dronedarone (multaq) pending affordability. This was sent to the pharmacy to initiate process. She will call us tomorrow if it is out of her price range, and we will further discuss plan from that point if multaq not an option.    Follow-up has been arranged with the Atrial Fibrillation clinic in approximately 1-2 weeks as below.   QTc on am EKG of discharge showed QTc~470 ms (improved from >520 ms)  Physical Exam: Vitals:   09/24/20 2100 09/25/20 0630 09/25/20 0830 09/25/20 1409  BP: 101/83 (!) 154/54 120/69 (!) 115/42  Pulse: 62 63  61  Resp: 18 18    Temp: 98.6 F (37 C) 98.6 F (37 C)  98.1 F (36.7 C)  TempSrc: Oral Oral  Oral  SpO2: 100% 96%    Weight:  120.8 kg    Height:        GEN- The patient is well appearing, alert and oriented x 3 today.   HEENT: normocephalic, atraumatic; sclera clear, conjunctiva pink; hearing intact; oropharynx clear; neck supple, no JVP Lymph- no cervical lymphadenopathy Lungs- Clear to ausculation bilaterally, normal work of breathing.  No wheezes, rales, rhonchi Heart- Regular rate and rhythm, no murmurs, rubs or gallops, PMI not laterally displaced GI- soft, non-tender, non-distended, bowel sounds present, no hepatosplenomegaly Extremities- no clubbing, cyanosis, or edema; DP/PT/radial pulses 2+ bilaterally  MS- no significant deformity or atrophy Skin- warm and dry, no rash or lesion Psych- euthymic mood, full affect Neuro- strength and sensation are intact  Labs:   Lab Results  Component Value Date   WBC 6.0 02/26/2020   HGB 11.2 02/26/2020   HCT 34.8 02/26/2020   MCV 94 02/26/2020   PLT 250 02/26/2020    Recent Labs  Lab 09/25/20 0402  NA 137  K 4.3  CL 103  CO2 24  BUN 21  CREATININE 1.25*  CALCIUM 9.4  GLUCOSE  178*     Discharge Medications:  Allergies as of 09/25/2020      Reactions   Amiodarone Other (See Comments)   adverse reaction: Tremor/gait disurbance   Aspirin Other (See Comments)   "Burns my stomach"   Methimazole Nausea And Vomiting   Propoxyphene Nausea And Vomiting   Albuterol Palpitations   Atorvastatin Other (See Comments)   Body aches and pains--stiffness.    Livalo [pitavastatin]    Body aches      Medication List    TAKE these medications   carvedilol 25 MG tablet Commonly known as: COREG TAKE 1 TABLET(25 MG) BY MOUTH TWICE DAILY What changed: See the new instructions.   colchicine 0.6 MG tablet Take 1 tablet (0.6 mg total) by mouth daily. What changed:   when to take this  reasons to take this   dronedarone 400 MG tablet Commonly known as: MULTAQ Take 1 tablet (400 mg total) by mouth 2 (two) times daily with a meal. Start taking on: September 26, 2020   Entresto 24-26 MG Generic drug: sacubitril-valsartan TAKE 1 TABLET BY MOUTH TWICE DAILY   furosemide 40 MG tablet Commonly known as: LASIX TAKE 1.5 TABLETS BY MOUTH DAILY, IF AN ADDITIONAL WEIGHT GAIN OF 2-3 POUNDS TAKE 1 EXTRA TABLET What changed: See the new instructions.   ibuprofen 200 MG tablet Commonly known as: ADVIL Take 400 mg by mouth every 6 (six) hours as needed for headache or mild pain.   Insulin Pen Needle 30G X 8 MM Misc Commonly known as: NOVOFINE For Humalog meal time TID   Jardiance 10 MG Tabs tablet Generic drug: empagliflozin Take 10 mg by mouth daily.   metFORMIN 500 MG tablet Commonly known as: GLUCOPHAGE Take 500 mg by mouth 2 (two) times daily.   NovoLOG FlexPen 100 UNIT/ML FlexPen Generic drug: insulin aspart Inject 13 Units into the skin 2 (two) times daily with a meal.   Pradaxa 150 MG Caps capsule Generic drug: dabigatran TAKE 1 CAPSULE BY MOUTH TWICE DAILY What changed: how much to take   simvastatin 10 MG tablet Commonly known as: ZOCOR Take 10 mg by  mouth daily.   spironolactone 25 MG tablet Commonly known as: ALDACTONE TAKE 1 TABLET(25 MG) BY MOUTH DAILY What changed: See the new instructions.       Disposition:    Follow-up Information    [facility] Follow up on 10/03/2020.   Specialty: Cardiology Why: at 230 pm for post hospital follow up Contact information: [address] [id number] [place] [phone]              Duration of Discharge Encounter: Greater than 30 minutes including physician time.  Jacalyn Lefevre, PA-C  09/25/2020 3:51 PM

## 2020-09-25 NOTE — Care Management (Signed)
09-25-20 Benefits check submitted for Multaq. Case Manager will follow for cost. Graves-Bigelow, Ocie Cornfield, RN, BSN Case Manager

## 2020-10-02 NOTE — H&P (View-Only) (Signed)
Primary Care Physician: Lilian Coma., MD Primary Electrophysiologist: Dr [name] Referring Physician: Dr [name]   Stephanie [name] is a 69 y.o. female with a history of HTN, tachycardia mediated CM, HLD, sarcoidosis, and persistent atrial fibrillation who presents for follow up in the [facility]. The patient was initially diagnosed with atrial fibrillation remotely. She has had DCCV in 2014, 2018, and 03/04/20. She was seen for afib ablation consideration in 2015 but opted not to proceed. Previously maintained on amiodarone but this was stopped 2/2 tremors and hyperthyroidism. Patient is on Pradaxa for a CHADS2VASC score of 4. She was seen 04/05/20 after DCCV and was back in afib. Repeat echo showed decreased EF of 45-50% and dofetilide was recommended. Unfortunately, dofetilide admissions were temporarily on hold 2/2 increase in COVID-19 hospitalizations.   On follow up today, patient was admitted 12/6 for dofetilide loading. Unfortunately, her QT became too prolonged and the medication had to be stopped. She did convert to SR after the first dose. She was started on Multaq. She is back in rate controlled afib today. She does report she felt more energetic while in SR.   Today, she denies symptoms of palpitations, chest pain, shortness of breath, orthopnea, PND, lower extremity edema, dizziness, presyncope, syncope, snoring, daytime somnolence, bleeding, or neurologic sequela. The patient is tolerating medications without difficulties and is otherwise without complaint today.    Atrial Fibrillation Risk Factors:  she does not have symptoms or diagnosis of sleep apnea. she does not have a history of rheumatic fever. she does not have a history of alcohol use.   she has a BMI of Body mass index is 47.33 kg/m.Marland Kitchen Filed Weights   10/03/20 1427  Weight: 121.2 kg    Family History  Problem Relation Age of Onset  . Pneumonia Father        deceased  . Hypertension  Father   . Cancer Father   . Multiple sclerosis Mother        hx  . Emphysema Other        runs in the family     Atrial Fibrillation Management history:  Previous antiarrhythmic drugs: amiodarone, dofetilide (QT prolong), Multaq Previous cardioversions: 2014, 2018, 03/04/20 Previous ablations: none CHADS2VASC score: 4 Anticoagulation history: Pradaxa    Past Medical History:  Diagnosis Date  . Anemia   . Ankle fracture, right    x 2  . Antineoplastic and immunosuppressive drugs causing adverse effect in therapeutic use   . Asthma   . Chronic venous hypertension without complications   . Colon polyps 2009  . Coronary artery disease    no CAD by cath 11.2010  . Depressive disorder, not elsewhere classified   . Diabetes mellitus type 2 in obese (HCC)    hgb AIC 6.1 09/02/2009, insulin dependent   . Diastolic CHF, chronic (HCC)    reports stable  . Diverticulosis of colon 2009   colonoscopy  . Gout    cortisone  shots helped  . Hepatitis, unspecified    hx of/per pt, never was told of having hepatitis  . Hx of hysterectomy 2002  . Hyperlipidemia   . Hypertension    controlled on medication   . Hypothyroidism    reports her thryoid levels are normal now   . Irritable bowel syndrome   . Lung nodule    9mm RLL nodule on CT Chest  07/2009, resolved by 2011 CT  . Noncompliance   . Obesity   . Persistent  atrial fibrillation ([place])    s/p DCCV 07/2009; Pradaxa Rx, states she is in sinus rhythm now   . PONV (postoperative nausea and vomiting)    after receiving "too much anesthesia" after a hernia surgery   . Sarcoidosis    dx by eye exam; seen during eye exam , report asymtptomatic "i dont have it now'   . Sleep disturbance 06/2013   sleep study, mild abnormality, no criteria for CPAP   Past Surgical History:  Procedure Laterality Date  . ABDOMINAL HYSTERECTOMY    . CARDIOVERSION     x 2  . CARDIOVERSION N/A 02/27/2013   Procedure: CARDIOVERSION;  Surgeon: Lelon Perla, MD;  Location: [facility] ENDOSCOPY;  Service: Cardiovascular;  Laterality: N/A;  . CARDIOVERSION N/A 03/24/2017   Procedure: CARDIOVERSION;  Surgeon: Josue Hector, MD;  Location: [facility] ENDOSCOPY;  Service: Cardiovascular;  Laterality: N/A;  . CARDIOVERSION N/A 03/04/2020   Procedure: CARDIOVERSION;  Surgeon: Josue Hector, MD;  Location: [facility] ENDOSCOPY;  Service: Cardiovascular;  Laterality: N/A;  . CHOLECYSTECTOMY  early 69s  . COLONOSCOPY     Diverticulosis, colon polyps, repeat 2014, Dr. Deatra Ina  . COLONOSCOPY WITH PROPOFOL N/A 07/25/2019   Procedure: COLONOSCOPY WITH PROPOFOL;  Surgeon: Yetta Flock, MD;  Location: [facility] ENDOSCOPY;  Service: Gastroenterology;  Laterality: N/A;  . ESOPHAGOGASTRODUODENOSCOPY N/A 05/16/2014   Procedure: ESOPHAGOGASTRODUODENOSCOPY (EGD);  Surgeon: Wonda Horner, MD;  Location: [facility] ORS;  Service: Gastroenterology;  Laterality: N/A;  . HERNIA REPAIR  [phone]   umbilical hernia  . MYOMECTOMY  2000  . POLYPECTOMY  07/25/2019   Procedure: POLYPECTOMY;  Surgeon: Yetta Flock, MD;  Location: [facility] ENDOSCOPY;  Service: Gastroenterology;;    Current Outpatient Medications  Medication Sig Dispense Refill  . carvedilol (COREG) 25 MG tablet TAKE 1 TABLET(25 MG) BY MOUTH TWICE DAILY 180 tablet 3  . colchicine 0.6 MG tablet Take 1 tablet (0.6 mg total) by mouth daily. 10 tablet 0  . dronedarone (MULTAQ) 400 MG tablet Take 1 tablet (400 mg total) by mouth 2 (two) times daily with a meal. 60 tablet 3  . ENTRESTO 24-26 MG TAKE 1 TABLET BY MOUTH TWICE DAILY 60 tablet 11  . furosemide (LASIX) 40 MG tablet TAKE 1.5 TABLETS BY MOUTH DAILY, IF AN ADDITIONAL WEIGHT GAIN OF 2-3 POUNDS TAKE 1 EXTRA TABLET 180 tablet 3  . ibuprofen (ADVIL) 200 MG tablet Take 400 mg by mouth every 6 (six) hours as needed for headache or mild pain.     . Insulin Pen Needle (NOVOFINE) 30G X 8 MM MISC For Humalog meal time TID 100 each 11  . JARDIANCE 10 MG TABS tablet Take 10 mg by mouth daily.     . metFORMIN (GLUCOPHAGE) 500 MG tablet Take 500 mg by mouth 2 (two) times daily.     Marland Kitchen NOVOLOG FLEXPEN 100 UNIT/ML FlexPen Inject 13 Units into the skin 2 (two) times daily with a meal.     . PRADAXA 150 MG CAPS capsule TAKE 1 CAPSULE BY MOUTH TWICE DAILY 60 capsule 8  . simvastatin (ZOCOR) 10 MG tablet Take 10 mg by mouth daily.     Marland Kitchen spironolactone (ALDACTONE) 25 MG tablet TAKE 1 TABLET(25 MG) BY MOUTH DAILY 90 tablet 3   No current facility-administered medications for this encounter.    Allergies  Allergen Reactions  . Amiodarone Other (See Comments)    adverse reaction: Tremor/gait disurbance  . Aspirin Other (See Comments)    "Burns my stomach"  .  Methimazole Nausea And Vomiting  . Propoxyphene Nausea And Vomiting  . Albuterol Palpitations  . Atorvastatin Other (See Comments)    Body aches and pains--stiffness.   [name] [Pitavastatin]     Body aches    Social History   Socioeconomic History  . Marital status: Single    Spouse name: Not on file  . Number of children: 5  . Years of education: college  . Highest education level: Not on file  Occupational History  . Occupation: Disabled.  Tobacco Use  . Smoking status: Never Smoker  . Smokeless tobacco: Never Used  Vaping Use  . Vaping Use: Never used  Substance and Sexual Activity  . Alcohol use: Yes    Alcohol/week: 0.0 standard drinks    Comment: rare  . Drug use: No  . Sexual activity: Not Currently    Birth control/protection: Surgical  Other Topics Concern  . Not on file  Social History Narrative   Pt lives in [place] with her son.  Unemployed.  Right handed.     Education college   Social Determinants of Radio broadcast assistant Strain: Not on file  Food Insecurity: Not on file  Transportation Needs: Not on file  Physical Activity: Not on file  Stress: Not on file  Social Connections: Not on file  Intimate Partner Violence: Not on file     ROS- All systems are reviewed and negative  except as per the HPI above.  Physical Exam: Vitals:   10/03/20 1427  BP: 106/76  Pulse: 76  Weight: 121.2 kg  Height: 5\' 3"  (1.6 m)    GEN- The patient is well appearing obese female, alert and oriented x 3 today.   HEENT-head normocephalic, atraumatic, sclera clear, conjunctiva pink, hearing intact, trachea midline. Lungs- Clear to ausculation bilaterally, normal work of breathing Heart- irregular rate and rhythm, no murmurs, rubs or gallops  GI- soft, NT, ND, + BS Extremities- no clubbing, cyanosis, or edema MS- no significant deformity or atrophy Skin- no rash or lesion Psych- euthymic mood, full affect Neuro- strength and sensation are intact   Wt Readings from Last 3 Encounters:  10/03/20 121.2 kg  09/25/20 120.8 kg  09/23/20 122.6 kg    EKG today demonstrates afib HR 76, QRS 106, QTc 391  Echo 05/28/20 demonstrated  1. Left ventricular ejection fraction, by estimation, is 45 to 50%. The  left ventricle has mildly decreased function. The left ventricle  demonstrates global hypokinesis. There is moderate concentric left  ventricular hypertrophy. Left ventricular  diastolic parameters are indeterminate due to underlying atrial  fibrillation.  2. Right ventricular systolic function is mildly reduced. The right  ventricular size is normal. Tricuspid regurgitation signal is inadequate  for assessing PA pressure.  3. Left atrial size was mildly dilated.  4. The mitral valve is normal in structure. Trivial mitral valve  regurgitation. No evidence of mitral stenosis.  5. The aortic valve is tricuspid. Aortic valve regurgitation is not  visualized. Mild aortic valve sclerosis is present, with no evidence of  aortic valve stenosis.  6. The inferior vena cava is normal in size with greater than 50%  respiratory variability, suggesting right atrial pressure of 3 mmHg.   Epic records are reviewed at length today  CHA2DS2-VASc Score = 4  The patient's score is based  upon: CHF History: Yes HTN History: Yes Diabetes History: No Stroke History: No Vascular Disease History: No Age Score: 1 Gender Score: 1      ASSESSMENT AND  PLAN: 1. Persistent Atrial Fibrillation (ICD10:  I48.19) The patient's CHA2DS2-VASc score is 4, indicating a 4.8% annual risk of stroke.   Patient s/p unsuccessful dofetilide loading 2/2 QT prolongation. She did convert to SR on the mediation.  Unfortunately she is back in rate controlled afib. Will plan for DCCV. Check Cmet/CBC today. Continue Multaq 400 mg BID Continue Pradaxa 150 mg BID. Patient denies any missed doses in the last 3 weeks.  Continue Coreg 25 mg BID  2. Secondary Hypercoagulable State (ICD10:  D68.69) The patient is at significant risk for stroke/thromboembolism based upon her CHA2DS2-VASc Score of 4.  Continue Dabigatran (Pradaxa).   3. Obesity Body mass index is 47.33 kg/m. Lifestyle modification was discussed and encouraged including regular physical activity and weight reduction.  4. NICM Suspected tachycardia mediated. EF decreased to 45-50% on last echo. No signs or symptoms of fluid overload.  5. HTN Stable, no changes today.    Follow up with Dr [name] post DCCV.    [facility] [address] [place], [place] [address] [phone] 10/03/2020 3:38 PM

## 2020-10-02 NOTE — Progress Notes (Signed)
Primary Care Physician: Lilian Coma., MD Primary Electrophysiologist: Dr [name] Referring Physician: Dr [name]   Stephanie [name] is a 69 y.o. female with a history of HTN, tachycardia mediated CM, HLD, sarcoidosis, and persistent atrial fibrillation who presents for follow up in the [facility]. The patient was initially diagnosed with atrial fibrillation remotely. She has had DCCV in 2014, 2018, and 03/04/20. She was seen for afib ablation consideration in 2015 but opted not to proceed. Previously maintained on amiodarone but this was stopped 2/2 tremors and hyperthyroidism. Patient is on Pradaxa for a CHADS2VASC score of 4. She was seen 04/05/20 after DCCV and was back in afib. Repeat echo showed decreased EF of 45-50% and dofetilide was recommended. Unfortunately, dofetilide admissions were temporarily on hold 2/2 increase in COVID-19 hospitalizations.   On follow up today, patient was admitted 12/6 for dofetilide loading. Unfortunately, her QT became too prolonged and the medication had to be stopped. She did convert to SR after the first dose. She was started on Multaq. She is back in rate controlled afib today. She does report she felt more energetic while in SR.   Today, she denies symptoms of palpitations, chest pain, shortness of breath, orthopnea, PND, lower extremity edema, dizziness, presyncope, syncope, snoring, daytime somnolence, bleeding, or neurologic sequela. The patient is tolerating medications without difficulties and is otherwise without complaint today.    Atrial Fibrillation Risk Factors:  she does not have symptoms or diagnosis of sleep apnea. she does not have a history of rheumatic fever. she does not have a history of alcohol use.   she has a BMI of Body mass index is 47.33 kg/m.Marland Kitchen Filed Weights   10/03/20 1427  Weight: 121.2 kg    Family History  Problem Relation Age of Onset  . Pneumonia Father        deceased  . Hypertension  Father   . Cancer Father   . Multiple sclerosis Mother        hx  . Emphysema Other        runs in the family     Atrial Fibrillation Management history:  Previous antiarrhythmic drugs: amiodarone, dofetilide (QT prolong), Multaq Previous cardioversions: 2014, 2018, 03/04/20 Previous ablations: none CHADS2VASC score: 4 Anticoagulation history: Pradaxa    Past Medical History:  Diagnosis Date  . Anemia   . Ankle fracture, right    x 2  . Antineoplastic and immunosuppressive drugs causing adverse effect in therapeutic use   . Asthma   . Chronic venous hypertension without complications   . Colon polyps 2009  . Coronary artery disease    no CAD by cath 11.2010  . Depressive disorder, not elsewhere classified   . Diabetes mellitus type 2 in obese (HCC)    hgb AIC 6.1 09/02/2009, insulin dependent   . Diastolic CHF, chronic (HCC)    reports stable  . Diverticulosis of colon 2009   colonoscopy  . Gout    cortisone  shots helped  . Hepatitis, unspecified    hx of/per pt, never was told of having hepatitis  . Hx of hysterectomy 2002  . Hyperlipidemia   . Hypertension    controlled on medication   . Hypothyroidism    reports her thryoid levels are normal now   . Irritable bowel syndrome   . Lung nodule    18mm RLL nodule on CT Chest  07/2009, resolved by 2011 CT  . Noncompliance   . Obesity   . Persistent  atrial fibrillation ([place])    s/p DCCV 07/2009; Pradaxa Rx, states she is in sinus rhythm now   . PONV (postoperative nausea and vomiting)    after receiving "too much anesthesia" after a hernia surgery   . Sarcoidosis    dx by eye exam; seen during eye exam , report asymtptomatic "i dont have it now'   . Sleep disturbance 06/2013   sleep study, mild abnormality, no criteria for CPAP   Past Surgical History:  Procedure Laterality Date  . ABDOMINAL HYSTERECTOMY    . CARDIOVERSION     x 2  . CARDIOVERSION N/A 02/27/2013   Procedure: CARDIOVERSION;  Surgeon: Lelon Perla, MD;  Location: [facility] ENDOSCOPY;  Service: Cardiovascular;  Laterality: N/A;  . CARDIOVERSION N/A 03/24/2017   Procedure: CARDIOVERSION;  Surgeon: Josue Hector, MD;  Location: [facility] ENDOSCOPY;  Service: Cardiovascular;  Laterality: N/A;  . CARDIOVERSION N/A 03/04/2020   Procedure: CARDIOVERSION;  Surgeon: Josue Hector, MD;  Location: [facility] ENDOSCOPY;  Service: Cardiovascular;  Laterality: N/A;  . CHOLECYSTECTOMY  early 76s  . COLONOSCOPY     Diverticulosis, colon polyps, repeat 2014, Dr. Deatra Ina  . COLONOSCOPY WITH PROPOFOL N/A 07/25/2019   Procedure: COLONOSCOPY WITH PROPOFOL;  Surgeon: Yetta Flock, MD;  Location: [facility] ENDOSCOPY;  Service: Gastroenterology;  Laterality: N/A;  . ESOPHAGOGASTRODUODENOSCOPY N/A 05/16/2014   Procedure: ESOPHAGOGASTRODUODENOSCOPY (EGD);  Surgeon: Wonda Horner, MD;  Location: [facility] ORS;  Service: Gastroenterology;  Laterality: N/A;  . HERNIA REPAIR  [phone]   umbilical hernia  . MYOMECTOMY  2000  . POLYPECTOMY  07/25/2019   Procedure: POLYPECTOMY;  Surgeon: Yetta Flock, MD;  Location: [facility] ENDOSCOPY;  Service: Gastroenterology;;    Current Outpatient Medications  Medication Sig Dispense Refill  . carvedilol (COREG) 25 MG tablet TAKE 1 TABLET(25 MG) BY MOUTH TWICE DAILY 180 tablet 3  . colchicine 0.6 MG tablet Take 1 tablet (0.6 mg total) by mouth daily. 10 tablet 0  . dronedarone (MULTAQ) 400 MG tablet Take 1 tablet (400 mg total) by mouth 2 (two) times daily with a meal. 60 tablet 3  . ENTRESTO 24-26 MG TAKE 1 TABLET BY MOUTH TWICE DAILY 60 tablet 11  . furosemide (LASIX) 40 MG tablet TAKE 1.5 TABLETS BY MOUTH DAILY, IF AN ADDITIONAL WEIGHT GAIN OF 2-3 POUNDS TAKE 1 EXTRA TABLET 180 tablet 3  . ibuprofen (ADVIL) 200 MG tablet Take 400 mg by mouth every 6 (six) hours as needed for headache or mild pain.     . Insulin Pen Needle (NOVOFINE) 30G X 8 MM MISC For Humalog meal time TID 100 each 11  . JARDIANCE 10 MG TABS tablet Take 10 mg by mouth daily.     . metFORMIN (GLUCOPHAGE) 500 MG tablet Take 500 mg by mouth 2 (two) times daily.     Marland Kitchen NOVOLOG FLEXPEN 100 UNIT/ML FlexPen Inject 13 Units into the skin 2 (two) times daily with a meal.     . PRADAXA 150 MG CAPS capsule TAKE 1 CAPSULE BY MOUTH TWICE DAILY 60 capsule 8  . simvastatin (ZOCOR) 10 MG tablet Take 10 mg by mouth daily.     Marland Kitchen spironolactone (ALDACTONE) 25 MG tablet TAKE 1 TABLET(25 MG) BY MOUTH DAILY 90 tablet 3   No current facility-administered medications for this encounter.    Allergies  Allergen Reactions  . Amiodarone Other (See Comments)    adverse reaction: Tremor/gait disurbance  . Aspirin Other (See Comments)    "Burns my stomach"  .  Methimazole Nausea And Vomiting  . Propoxyphene Nausea And Vomiting  . Albuterol Palpitations  . Atorvastatin Other (See Comments)    Body aches and pains--stiffness.   [name] [Pitavastatin]     Body aches    Social History   Socioeconomic History  . Marital status: Single    Spouse name: Not on file  . Number of children: 5  . Years of education: college  . Highest education level: Not on file  Occupational History  . Occupation: Disabled.  Tobacco Use  . Smoking status: Never Smoker  . Smokeless tobacco: Never Used  Vaping Use  . Vaping Use: Never used  Substance and Sexual Activity  . Alcohol use: Yes    Alcohol/week: 0.0 standard drinks    Comment: rare  . Drug use: No  . Sexual activity: Not Currently    Birth control/protection: Surgical  Other Topics Concern  . Not on file  Social History Narrative   Pt lives in [place] with her son.  Unemployed.  Right handed.     Education college   Social Determinants of Radio broadcast assistant Strain: Not on file  Food Insecurity: Not on file  Transportation Needs: Not on file  Physical Activity: Not on file  Stress: Not on file  Social Connections: Not on file  Intimate Partner Violence: Not on file     ROS- All systems are reviewed and negative  except as per the HPI above.  Physical Exam: Vitals:   10/03/20 1427  BP: 106/76  Pulse: 76  Weight: 121.2 kg  Height: 5\' 3"  (1.6 m)    GEN- The patient is well appearing obese female, alert and oriented x 3 today.   HEENT-head normocephalic, atraumatic, sclera clear, conjunctiva pink, hearing intact, trachea midline. Lungs- Clear to ausculation bilaterally, normal work of breathing Heart- irregular rate and rhythm, no murmurs, rubs or gallops  GI- soft, NT, ND, + BS Extremities- no clubbing, cyanosis, or edema MS- no significant deformity or atrophy Skin- no rash or lesion Psych- euthymic mood, full affect Neuro- strength and sensation are intact   Wt Readings from Last 3 Encounters:  10/03/20 121.2 kg  09/25/20 120.8 kg  09/23/20 122.6 kg    EKG today demonstrates afib HR 76, QRS 106, QTc 391  Echo 05/28/20 demonstrated  1. Left ventricular ejection fraction, by estimation, is 45 to 50%. The  left ventricle has mildly decreased function. The left ventricle  demonstrates global hypokinesis. There is moderate concentric left  ventricular hypertrophy. Left ventricular  diastolic parameters are indeterminate due to underlying atrial  fibrillation.  2. Right ventricular systolic function is mildly reduced. The right  ventricular size is normal. Tricuspid regurgitation signal is inadequate  for assessing PA pressure.  3. Left atrial size was mildly dilated.  4. The mitral valve is normal in structure. Trivial mitral valve  regurgitation. No evidence of mitral stenosis.  5. The aortic valve is tricuspid. Aortic valve regurgitation is not  visualized. Mild aortic valve sclerosis is present, with no evidence of  aortic valve stenosis.  6. The inferior vena cava is normal in size with greater than 50%  respiratory variability, suggesting right atrial pressure of 3 mmHg.   Epic records are reviewed at length today  CHA2DS2-VASc Score = 4  The patient's score is based  upon: CHF History: Yes HTN History: Yes Diabetes History: No Stroke History: No Vascular Disease History: No Age Score: 1 Gender Score: 1      ASSESSMENT AND  PLAN: 1. Persistent Atrial Fibrillation (ICD10:  I48.19) The patient's CHA2DS2-VASc score is 4, indicating a 4.8% annual risk of stroke.   Patient s/p unsuccessful dofetilide loading 2/2 QT prolongation. She did convert to SR on the mediation.  Unfortunately she is back in rate controlled afib. Will plan for DCCV. Check Cmet/CBC today. Continue Multaq 400 mg BID Continue Pradaxa 150 mg BID. Patient denies any missed doses in the last 3 weeks.  Continue Coreg 25 mg BID  2. Secondary Hypercoagulable State (ICD10:  D68.69) The patient is at significant risk for stroke/thromboembolism based upon her CHA2DS2-VASc Score of 4.  Continue Dabigatran (Pradaxa).   3. Obesity Body mass index is 47.33 kg/m. Lifestyle modification was discussed and encouraged including regular physical activity and weight reduction.  4. NICM Suspected tachycardia mediated. EF decreased to 45-50% on last echo. No signs or symptoms of fluid overload.  5. HTN Stable, no changes today.    Follow up with Dr [name] post DCCV.    [facility] [address] [place], [place] [address] [phone] 10/03/2020 3:38 PM

## 2020-10-03 ENCOUNTER — Ambulatory Visit (HOSPITAL_COMMUNITY)
Admit: 2020-10-03 | Discharge: 2020-10-03 | Disposition: A | Discharge status: Home / Self Care | Payer: Medicare Other | Attending: Physician Assistant | Admitting: Physician Assistant

## 2020-10-03 ENCOUNTER — Encounter (HOSPITAL_COMMUNITY): Payer: Self-pay | Admitting: Physician Assistant

## 2020-10-03 ENCOUNTER — Other Ambulatory Visit: Payer: Self-pay

## 2020-10-03 VITALS — BP 106/76 | HR 76 | Ht 63.0 in | Wt 267.2 lb

## 2020-10-03 DIAGNOSIS — I358 Other nonrheumatic aortic valve disorders: Secondary | ICD-10-CM | POA: Insufficient documentation

## 2020-10-03 DIAGNOSIS — D6869 Other thrombophilia: Secondary | ICD-10-CM | POA: Insufficient documentation

## 2020-10-03 DIAGNOSIS — I428 Other cardiomyopathies: Secondary | ICD-10-CM | POA: Diagnosis not present

## 2020-10-03 DIAGNOSIS — Z6841 Body Mass Index (BMI) 40.0 and over, adult: Secondary | ICD-10-CM | POA: Diagnosis not present

## 2020-10-03 DIAGNOSIS — I4819 Other persistent atrial fibrillation: Secondary | ICD-10-CM | POA: Insufficient documentation

## 2020-10-03 DIAGNOSIS — E669 Obesity, unspecified: Secondary | ICD-10-CM | POA: Insufficient documentation

## 2020-10-03 DIAGNOSIS — I10 Essential (primary) hypertension: Secondary | ICD-10-CM | POA: Insufficient documentation

## 2020-10-03 LAB — COMPREHENSIVE METABOLIC PANEL
ALT: 12 U/L (ref 0–44)
AST: 12 U/L — ABNORMAL LOW (ref 15–41)
Albumin: 4 g/dL (ref 3.5–5.0)
Alkaline Phosphatase: 81 U/L (ref 38–126)
Anion gap: 13 (ref 5–15)
BUN: 37 mg/dL — ABNORMAL HIGH (ref 8–23)
CO2: 21 mmol/L — ABNORMAL LOW (ref 22–32)
Calcium: 9.8 mg/dL (ref 8.9–10.3)
Chloride: 102 mmol/L (ref 98–111)
Creatinine, Ser: 1.54 mg/dL — ABNORMAL HIGH (ref 0.44–1.00)
GFR, Estimated: 37 mL/min — ABNORMAL LOW (ref >60)
Glucose, Bld: 189 mg/dL — ABNORMAL HIGH (ref 70–99)
Potassium: 4.2 mmol/L (ref 3.5–5.1)
Sodium: 136 mmol/L (ref 135–145)
Total Bilirubin: 0.6 mg/dL (ref 0.3–1.2)
Total Protein: 7.9 g/dL (ref 6.5–8.1)

## 2020-10-03 LAB — CBC
HCT: 34.7 % — ABNORMAL LOW (ref 36.0–46.0)
Hemoglobin: 11.4 g/dL — ABNORMAL LOW (ref 12.0–15.0)
MCH: 30.9 pg (ref 26.0–34.0)
MCHC: 32.9 g/dL (ref 30.0–36.0)
MCV: 94 fL (ref 80.0–100.0)
Platelets: 238 10*3/uL (ref 150–400)
RBC: 3.69 MIL/uL — ABNORMAL LOW (ref 3.87–5.11)
RDW: 13.4 % (ref 11.5–15.5)
WBC: 5.2 10*3/uL (ref 4.0–10.5)
nRBC: 0 % (ref 0.0–0.2)

## 2020-10-03 NOTE — Patient Instructions (Signed)
Cardioversion scheduled for Wednesday, December 22nd  - Arrive at the [facility] and go to admitting at 1130AM  - Do not eat or drink anything after midnight the night prior to your procedure.  - Take all your morning medication (except diabetic medications) with a sip of water prior to arrival.  - You will not be able to drive home after your procedure.  - Do NOT miss any doses of your blood thinner - if you should miss a dose please notify our office immediately.  - If you feel as if you go back into normal rhythm prior to scheduled cardioversion, please notify our office immediately. If your procedure is canceled in the cardioversion suite you will be charged a cancellation fee.

## 2020-10-07 ENCOUNTER — Other Ambulatory Visit (HOSPITAL_COMMUNITY)
Admission: RE | Admit: 2020-10-07 | Discharge: 2020-10-07 | Disposition: A | Discharge status: Home / Self Care | Payer: Medicare Other | Source: Ambulatory Visit | Attending: Cardiovascular Disease | Admitting: Cardiovascular Disease

## 2020-10-07 DIAGNOSIS — Z01812 Encounter for preprocedural laboratory examination: Secondary | ICD-10-CM | POA: Diagnosis not present

## 2020-10-07 DIAGNOSIS — Z20822 Contact with and (suspected) exposure to covid-19: Secondary | ICD-10-CM | POA: Diagnosis not present

## 2020-10-08 LAB — SARS CORONAVIRUS 2 (TAT 6-24 HRS): SARS Coronavirus 2: NEGATIVE

## 2020-10-09 ENCOUNTER — Other Ambulatory Visit: Payer: Self-pay

## 2020-10-09 ENCOUNTER — Ambulatory Visit (HOSPITAL_COMMUNITY)
Admission: RE | Admit: 2020-10-09 | Discharge: 2020-10-09 | Disposition: A | Discharge status: Home / Self Care | Payer: Medicare Other | Attending: Cardiovascular Disease | Admitting: Cardiovascular Disease

## 2020-10-09 ENCOUNTER — Encounter (HOSPITAL_COMMUNITY)
Admission: RE | Disposition: A | Discharge status: Home / Self Care | Payer: Self-pay | Source: Home / Self Care | Attending: Cardiovascular Disease

## 2020-10-09 ENCOUNTER — Encounter (HOSPITAL_COMMUNITY): Payer: Self-pay | Admitting: Cardiovascular Disease

## 2020-10-09 DIAGNOSIS — I4819 Other persistent atrial fibrillation: Secondary | ICD-10-CM | POA: Insufficient documentation

## 2020-10-09 DIAGNOSIS — Z538 Procedure and treatment not carried out for other reasons: Secondary | ICD-10-CM | POA: Insufficient documentation

## 2020-10-09 SURGERY — CANCELLED PROCEDURE

## 2020-10-09 NOTE — Interval H&P Note (Signed)
History and Physical Interval Note:  10/09/2020 11:44 AM  Stephanie [name]  has presented today for surgery, with the diagnosis of AFIB.  The various methods of treatment have been discussed with the patient and family. After consideration of risks, benefits and other options for treatment, the patient has consented to  Procedure(s): CARDIOVERSION (N/A) as a surgical intervention.  The patient's history has been reviewed, patient examined, no change in status, stable for surgery.  I have reviewed the patient's chart and labs.  Questions were answered to the patient's satisfaction.     Jenkins Rouge

## 2020-10-09 NOTE — Progress Notes (Signed)
Pt's cardioversion cancelled today per Dr. Johnsie Cancel because pt is in normal sinus rhythm verified by 12 lead EKG. Jobe Igo, RN

## 2020-11-04 ENCOUNTER — Ambulatory Visit: Payer: Medicare Other | Admitting: Student

## 2020-11-12 ENCOUNTER — Inpatient Hospital Stay (HOSPITAL_COMMUNITY)
Admission: EM | Admit: 2020-11-12 | Discharge: 2020-11-16 | DRG: 177 | Disposition: A | Discharge status: Home / Self Care | Payer: Medicare Other | Attending: Internal Medicine | Admitting: Internal Medicine

## 2020-11-12 ENCOUNTER — Other Ambulatory Visit: Payer: Self-pay

## 2020-11-12 ENCOUNTER — Emergency Department (HOSPITAL_COMMUNITY): Payer: Medicare Other

## 2020-11-12 ENCOUNTER — Encounter (HOSPITAL_COMMUNITY): Payer: Self-pay | Admitting: Emergency Medicine

## 2020-11-12 DIAGNOSIS — I5042 Chronic combined systolic (congestive) and diastolic (congestive) heart failure: Secondary | ICD-10-CM | POA: Diagnosis not present

## 2020-11-12 DIAGNOSIS — E039 Hypothyroidism, unspecified: Secondary | ICD-10-CM | POA: Diagnosis present

## 2020-11-12 DIAGNOSIS — R739 Hyperglycemia, unspecified: Secondary | ICD-10-CM

## 2020-11-12 DIAGNOSIS — E1100 Type 2 diabetes mellitus with hyperosmolarity without nonketotic hyperglycemic-hyperosmolar coma (NKHHC): Secondary | ICD-10-CM | POA: Diagnosis present

## 2020-11-12 DIAGNOSIS — E1122 Type 2 diabetes mellitus with diabetic chronic kidney disease: Secondary | ICD-10-CM | POA: Diagnosis present

## 2020-11-12 DIAGNOSIS — E1165 Type 2 diabetes mellitus with hyperglycemia: Secondary | ICD-10-CM | POA: Diagnosis not present

## 2020-11-12 DIAGNOSIS — E860 Dehydration: Secondary | ICD-10-CM | POA: Diagnosis present

## 2020-11-12 DIAGNOSIS — E669 Obesity, unspecified: Secondary | ICD-10-CM | POA: Diagnosis present

## 2020-11-12 DIAGNOSIS — Z794 Long term (current) use of insulin: Secondary | ICD-10-CM

## 2020-11-12 DIAGNOSIS — R0602 Shortness of breath: Secondary | ICD-10-CM | POA: Diagnosis not present

## 2020-11-12 DIAGNOSIS — Z9114 Patient's other noncompliance with medication regimen: Secondary | ICD-10-CM

## 2020-11-12 DIAGNOSIS — E785 Hyperlipidemia, unspecified: Secondary | ICD-10-CM | POA: Diagnosis present

## 2020-11-12 DIAGNOSIS — I428 Other cardiomyopathies: Secondary | ICD-10-CM

## 2020-11-12 DIAGNOSIS — I4819 Other persistent atrial fibrillation: Secondary | ICD-10-CM | POA: Diagnosis present

## 2020-11-12 DIAGNOSIS — I130 Hypertensive heart and chronic kidney disease with heart failure and stage 1 through stage 4 chronic kidney disease, or unspecified chronic kidney disease: Secondary | ICD-10-CM | POA: Diagnosis present

## 2020-11-12 DIAGNOSIS — J050 Acute obstructive laryngitis [croup]: Secondary | ICD-10-CM | POA: Diagnosis not present

## 2020-11-12 DIAGNOSIS — N179 Acute kidney failure, unspecified: Secondary | ICD-10-CM | POA: Diagnosis present

## 2020-11-12 DIAGNOSIS — I4891 Unspecified atrial fibrillation: Secondary | ICD-10-CM | POA: Diagnosis present

## 2020-11-12 DIAGNOSIS — Z7984 Long term (current) use of oral hypoglycemic drugs: Secondary | ICD-10-CM

## 2020-11-12 DIAGNOSIS — R52 Pain, unspecified: Secondary | ICD-10-CM

## 2020-11-12 DIAGNOSIS — J392 Other diseases of pharynx: Secondary | ICD-10-CM | POA: Diagnosis not present

## 2020-11-12 DIAGNOSIS — M5030 Other cervical disc degeneration, unspecified cervical region: Secondary | ICD-10-CM | POA: Diagnosis not present

## 2020-11-12 DIAGNOSIS — Z7901 Long term (current) use of anticoagulants: Secondary | ICD-10-CM

## 2020-11-12 DIAGNOSIS — U071 COVID-19: Secondary | ICD-10-CM | POA: Diagnosis not present

## 2020-11-12 DIAGNOSIS — Z79899 Other long term (current) drug therapy: Secondary | ICD-10-CM

## 2020-11-12 DIAGNOSIS — N1831 Chronic kidney disease, stage 3a: Secondary | ICD-10-CM | POA: Diagnosis present

## 2020-11-12 DIAGNOSIS — J069 Acute upper respiratory infection, unspecified: Secondary | ICD-10-CM | POA: Diagnosis present

## 2020-11-12 DIAGNOSIS — I2510 Atherosclerotic heart disease of native coronary artery without angina pectoris: Secondary | ICD-10-CM | POA: Diagnosis present

## 2020-11-12 DIAGNOSIS — Z6841 Body Mass Index (BMI) 40.0 and over, adult: Secondary | ICD-10-CM

## 2020-11-12 DIAGNOSIS — D869 Sarcoidosis, unspecified: Secondary | ICD-10-CM | POA: Diagnosis present

## 2020-11-12 DIAGNOSIS — I10 Essential (primary) hypertension: Secondary | ICD-10-CM | POA: Diagnosis present

## 2020-11-12 DIAGNOSIS — Z90710 Acquired absence of both cervix and uterus: Secondary | ICD-10-CM

## 2020-11-12 DIAGNOSIS — I5032 Chronic diastolic (congestive) heart failure: Secondary | ICD-10-CM | POA: Diagnosis present

## 2020-11-12 LAB — CBC
HCT: 36.8 % (ref 36.0–46.0)
Hemoglobin: 11.8 g/dL — ABNORMAL LOW (ref 12.0–15.0)
MCH: 30 pg (ref 26.0–34.0)
MCHC: 32.1 g/dL (ref 30.0–36.0)
MCV: 93.6 fL (ref 80.0–100.0)
Platelets: 250 10*3/uL (ref 150–400)
RBC: 3.93 MIL/uL (ref 3.87–5.11)
RDW: 13.2 % (ref 11.5–15.5)
WBC: 5.5 10*3/uL (ref 4.0–10.5)
nRBC: 0 % (ref 0.0–0.2)

## 2020-11-12 NOTE — ED Triage Notes (Signed)
Pt c/o increased shortness of breath with exertion, loss of appetite, and fatigue. Hx CHF, denies weight gain, new swelling.

## 2020-11-13 ENCOUNTER — Emergency Department (HOSPITAL_COMMUNITY): Payer: Medicare Other

## 2020-11-13 ENCOUNTER — Encounter (HOSPITAL_COMMUNITY): Payer: Self-pay | Admitting: Emergency Medicine

## 2020-11-13 DIAGNOSIS — I2510 Atherosclerotic heart disease of native coronary artery without angina pectoris: Secondary | ICD-10-CM | POA: Diagnosis present

## 2020-11-13 DIAGNOSIS — Z6841 Body Mass Index (BMI) 40.0 and over, adult: Secondary | ICD-10-CM | POA: Diagnosis not present

## 2020-11-13 DIAGNOSIS — J050 Acute obstructive laryngitis [croup]: Secondary | ICD-10-CM | POA: Diagnosis not present

## 2020-11-13 DIAGNOSIS — E1122 Type 2 diabetes mellitus with diabetic chronic kidney disease: Secondary | ICD-10-CM | POA: Diagnosis present

## 2020-11-13 DIAGNOSIS — I5032 Chronic diastolic (congestive) heart failure: Secondary | ICD-10-CM | POA: Diagnosis not present

## 2020-11-13 DIAGNOSIS — E785 Hyperlipidemia, unspecified: Secondary | ICD-10-CM | POA: Diagnosis present

## 2020-11-13 DIAGNOSIS — I428 Other cardiomyopathies: Secondary | ICD-10-CM | POA: Diagnosis not present

## 2020-11-13 DIAGNOSIS — J069 Acute upper respiratory infection, unspecified: Secondary | ICD-10-CM | POA: Diagnosis present

## 2020-11-13 DIAGNOSIS — R0602 Shortness of breath: Secondary | ICD-10-CM | POA: Diagnosis not present

## 2020-11-13 DIAGNOSIS — J392 Other diseases of pharynx: Secondary | ICD-10-CM | POA: Diagnosis not present

## 2020-11-13 DIAGNOSIS — N179 Acute kidney failure, unspecified: Secondary | ICD-10-CM | POA: Diagnosis present

## 2020-11-13 DIAGNOSIS — M5030 Other cervical disc degeneration, unspecified cervical region: Secondary | ICD-10-CM | POA: Diagnosis not present

## 2020-11-13 DIAGNOSIS — U071 COVID-19: Secondary | ICD-10-CM | POA: Diagnosis not present

## 2020-11-13 DIAGNOSIS — I4891 Unspecified atrial fibrillation: Secondary | ICD-10-CM | POA: Diagnosis not present

## 2020-11-13 DIAGNOSIS — E1165 Type 2 diabetes mellitus with hyperglycemia: Secondary | ICD-10-CM | POA: Diagnosis present

## 2020-11-13 DIAGNOSIS — E1100 Type 2 diabetes mellitus with hyperosmolarity without nonketotic hyperglycemic-hyperosmolar coma (NKHHC): Secondary | ICD-10-CM | POA: Diagnosis present

## 2020-11-13 DIAGNOSIS — E039 Hypothyroidism, unspecified: Secondary | ICD-10-CM | POA: Diagnosis present

## 2020-11-13 DIAGNOSIS — Z9114 Patient's other noncompliance with medication regimen: Secondary | ICD-10-CM | POA: Diagnosis not present

## 2020-11-13 DIAGNOSIS — Z79899 Other long term (current) drug therapy: Secondary | ICD-10-CM | POA: Diagnosis not present

## 2020-11-13 DIAGNOSIS — I10 Essential (primary) hypertension: Secondary | ICD-10-CM

## 2020-11-13 DIAGNOSIS — Z90710 Acquired absence of both cervix and uterus: Secondary | ICD-10-CM | POA: Diagnosis not present

## 2020-11-13 DIAGNOSIS — E860 Dehydration: Secondary | ICD-10-CM | POA: Diagnosis present

## 2020-11-13 DIAGNOSIS — I4819 Other persistent atrial fibrillation: Secondary | ICD-10-CM | POA: Diagnosis present

## 2020-11-13 DIAGNOSIS — I5042 Chronic combined systolic (congestive) and diastolic (congestive) heart failure: Secondary | ICD-10-CM | POA: Diagnosis not present

## 2020-11-13 DIAGNOSIS — Z794 Long term (current) use of insulin: Secondary | ICD-10-CM | POA: Diagnosis not present

## 2020-11-13 DIAGNOSIS — I130 Hypertensive heart and chronic kidney disease with heart failure and stage 1 through stage 4 chronic kidney disease, or unspecified chronic kidney disease: Secondary | ICD-10-CM | POA: Diagnosis present

## 2020-11-13 DIAGNOSIS — Z7901 Long term (current) use of anticoagulants: Secondary | ICD-10-CM | POA: Diagnosis not present

## 2020-11-13 DIAGNOSIS — Z7984 Long term (current) use of oral hypoglycemic drugs: Secondary | ICD-10-CM | POA: Diagnosis not present

## 2020-11-13 DIAGNOSIS — D869 Sarcoidosis, unspecified: Secondary | ICD-10-CM | POA: Diagnosis present

## 2020-11-13 DIAGNOSIS — N1831 Chronic kidney disease, stage 3a: Secondary | ICD-10-CM | POA: Diagnosis present

## 2020-11-13 LAB — CBG MONITORING, ED
Glucose-Capillary: >600 mg/dL (ref 70–99)
Glucose-Capillary: >600 mg/dL (ref 70–99)
Glucose-Capillary: 544 mg/dL (ref 70–99)
Glucose-Capillary: 317 mg/dL — ABNORMAL HIGH (ref 70–99)
Glucose-Capillary: 106 mg/dL — ABNORMAL HIGH (ref 70–99)
Glucose-Capillary: 231 mg/dL — ABNORMAL HIGH (ref 70–99)
Glucose-Capillary: 195 mg/dL — ABNORMAL HIGH (ref 70–99)
Glucose-Capillary: 244 mg/dL — ABNORMAL HIGH (ref 70–99)
Glucose-Capillary: 167 mg/dL — ABNORMAL HIGH (ref 70–99)
Glucose-Capillary: 243 mg/dL — ABNORMAL HIGH (ref 70–99)

## 2020-11-13 LAB — BETA-HYDROXYBUTYRIC ACID
Beta-Hydroxybutyric Acid: 0.67 mmol/L — ABNORMAL HIGH (ref 0.05–0.27)
Beta-Hydroxybutyric Acid: 0.16 mmol/L (ref 0.05–0.27)

## 2020-11-13 LAB — I-STAT VENOUS BLOOD GAS, ED
Acid-base deficit: 6 mmol/L — ABNORMAL HIGH (ref 0.0–2.0)
Bicarbonate: 19.1 mmol/L — ABNORMAL LOW (ref 20.0–28.0)
Calcium, Ion: 1.15 mmol/L (ref 1.15–1.40)
HCT: 33 % — ABNORMAL LOW (ref 36.0–46.0)
Hemoglobin: 11.2 g/dL — ABNORMAL LOW (ref 12.0–15.0)
O2 Saturation: 99 %
Potassium: 3.9 mmol/L (ref 3.5–5.1)
Sodium: 130 mmol/L — ABNORMAL LOW (ref 135–145)
TCO2: 20 mmol/L — ABNORMAL LOW (ref 22–32)
pCO2, Ven: 35.7 mmHg — ABNORMAL LOW (ref 44.0–60.0)
pH, Ven: 7.336 (ref 7.250–7.430)
pO2, Ven: 139 mmHg — ABNORMAL HIGH (ref 32.0–45.0)

## 2020-11-13 LAB — BASIC METABOLIC PANEL
Anion gap: 15 (ref 5–15)
Anion gap: 11 (ref 5–15)
BUN: 39 mg/dL — ABNORMAL HIGH (ref 8–23)
BUN: 34 mg/dL — ABNORMAL HIGH (ref 8–23)
CO2: 18 mmol/L — ABNORMAL LOW (ref 22–32)
CO2: 22 mmol/L (ref 22–32)
Calcium: 9.9 mg/dL (ref 8.9–10.3)
Calcium: 9.5 mg/dL (ref 8.9–10.3)
Chloride: 98 mmol/L (ref 98–111)
Chloride: 98 mmol/L (ref 98–111)
Creatinine, Ser: 1.93 mg/dL — ABNORMAL HIGH (ref 0.44–1.00)
Creatinine, Ser: 1.55 mg/dL — ABNORMAL HIGH (ref 0.44–1.00)
GFR, Estimated: 28 mL/min — ABNORMAL LOW (ref >60)
GFR, Estimated: 36 mL/min — ABNORMAL LOW (ref >60)
Glucose, Bld: 304 mg/dL — ABNORMAL HIGH (ref 70–99)
Glucose, Bld: 236 mg/dL — ABNORMAL HIGH (ref 70–99)
Potassium: 3.8 mmol/L (ref 3.5–5.1)
Potassium: 4.4 mmol/L (ref 3.5–5.1)
Sodium: 131 mmol/L — ABNORMAL LOW (ref 135–145)
Sodium: 131 mmol/L — ABNORMAL LOW (ref 135–145)

## 2020-11-13 LAB — FERRITIN: Ferritin: 453 ng/mL — ABNORMAL HIGH (ref 11–307)

## 2020-11-13 LAB — URINALYSIS, ROUTINE W REFLEX MICROSCOPIC
Bacteria, UA: NONE SEEN
Bilirubin Urine: NEGATIVE
Glucose, UA: >=500 mg/dL — AB
Hgb urine dipstick: NEGATIVE
Ketones, ur: 5 mg/dL — AB
Leukocytes,Ua: NEGATIVE
Nitrite: NEGATIVE
Protein, ur: NEGATIVE mg/dL
Specific Gravity, Urine: 1.026 (ref 1.005–1.030)
pH: 5 (ref 5.0–8.0)

## 2020-11-13 LAB — TROPONIN I (HIGH SENSITIVITY)
Troponin I (High Sensitivity): 14 ng/L (ref <18)
Troponin I (High Sensitivity): 15 ng/L (ref <18)

## 2020-11-13 LAB — PROCALCITONIN: Procalcitonin: 0.34 ng/mL

## 2020-11-13 LAB — HEMOGLOBIN A1C
Hgb A1c MFr Bld: 10.3 % — ABNORMAL HIGH (ref 4.8–5.6)
Mean Plasma Glucose: 248.91 mg/dL

## 2020-11-13 LAB — SARS CORONAVIRUS 2 BY RT PCR (HOSPITAL ORDER, PERFORMED IN ~~LOC~~ HOSPITAL LAB): SARS Coronavirus 2: POSITIVE — AB

## 2020-11-13 LAB — D-DIMER, QUANTITATIVE: D-Dimer, Quant: 0.27 ug/mL-FEU (ref 0.00–0.50)

## 2020-11-13 LAB — C-REACTIVE PROTEIN: CRP: 0.9 mg/dL (ref <1.0)

## 2020-11-13 LAB — OSMOLALITY: Osmolality: 304 mOsm/kg — ABNORMAL HIGH (ref 275–295)

## 2020-11-13 LAB — BRAIN NATRIURETIC PEPTIDE: B Natriuretic Peptide: 56.2 pg/mL (ref 0.0–100.0)

## 2020-11-13 MED ORDER — ONDANSETRON HCL 4 MG/2ML IJ SOLN: 4.0000 mg | Freq: Four times a day (QID) | INTRAMUSCULAR | Status: DC | PRN

## 2020-11-13 MED ORDER — ZINC SULFATE 220 (50 ZN) MG PO CAPS
220.0000 mg | ORAL_CAPSULE | Freq: Every day | ORAL | Status: DC
  Administered 2020-11-13 – 2020-11-16 (×4): 220 mg via ORAL
  Filled 2020-11-13 (×4): qty 1

## 2020-11-13 MED ORDER — APIXABAN 2.5 MG PO TABS
2.5000 mg | ORAL_TABLET | Freq: Two times a day (BID) | ORAL | Status: DC
Start: 2020-11-13 — End: 2020-11-13

## 2020-11-13 MED ORDER — ONDANSETRON HCL 4 MG PO TABS
4.0000 mg | ORAL_TABLET | Freq: Four times a day (QID) | ORAL | Status: DC | PRN
  Administered 2020-11-13 – 2020-11-14 (×2): 4 mg via ORAL
  Filled 2020-11-13 (×2): qty 1

## 2020-11-13 MED ORDER — DEXTROSE 50 % IV SOLN
0.0000 mL | INTRAVENOUS | Status: DC | PRN
Start: 2020-11-13 — End: 2020-11-13

## 2020-11-13 MED ORDER — DILTIAZEM HCL-DEXTROSE 125-5 MG/125ML-% IV SOLN (PREMIX)
5.0000 mg/h | INTRAVENOUS | Status: DC
  Administered 2020-11-13: 10 mg/h via INTRAVENOUS
  Administered 2020-11-14: 15 mg/h via INTRAVENOUS
  Filled 2020-11-13 (×2): qty 125

## 2020-11-13 MED ORDER — DEXTROSE IN LACTATED RINGERS 5 % IV SOLN: INTRAVENOUS | Status: DC

## 2020-11-13 MED ORDER — INSULIN ASPART 100 UNIT/ML ~~LOC~~ SOLN
0.0000 [IU] | Freq: Three times a day (TID) | SUBCUTANEOUS | Status: DC
  Administered 2020-11-13: 3 [IU] via SUBCUTANEOUS
  Administered 2020-11-14: 8 [IU] via SUBCUTANEOUS
  Administered 2020-11-14: 5 [IU] via SUBCUTANEOUS
  Administered 2020-11-14: 15 [IU] via SUBCUTANEOUS
  Administered 2020-11-15 (×3): 8 [IU] via SUBCUTANEOUS
  Administered 2020-11-16: 5 [IU] via SUBCUTANEOUS
  Administered 2020-11-16: 8 [IU] via SUBCUTANEOUS
  Administered 2020-11-16: 5 [IU] via SUBCUTANEOUS

## 2020-11-13 MED ORDER — INSULIN REGULAR(HUMAN) IN NACL 100-0.9 UT/100ML-% IV SOLN: INTRAVENOUS | Status: DC

## 2020-11-13 MED ORDER — LACTATED RINGERS IV SOLN: INTRAVENOUS | Status: DC

## 2020-11-13 MED ORDER — SODIUM CHLORIDE 0.9 % IV SOLN: 200.0000 mg | Freq: Once | INTRAVENOUS | Status: DC

## 2020-11-13 MED ORDER — HYDROCOD POLST-CPM POLST ER 10-8 MG/5ML PO SUER
5.0000 mL | Freq: Two times a day (BID) | ORAL | Status: DC | PRN
  Filled 2020-11-13: qty 5

## 2020-11-13 MED ORDER — POTASSIUM CHLORIDE 10 MEQ/100ML IV SOLN
10.0000 meq | INTRAVENOUS | Status: AC
  Administered 2020-11-13 (×3): 10 meq via INTRAVENOUS
  Filled 2020-11-13 (×2): qty 100

## 2020-11-13 MED ORDER — LACTATED RINGERS IV BOLUS: 20.0000 mL/kg | Freq: Once | INTRAVENOUS | Status: DC

## 2020-11-13 MED ORDER — INSULIN ASPART 100 UNIT/ML ~~LOC~~ SOLN
0.0000 [IU] | Freq: Every day | SUBCUTANEOUS | Status: DC
  Administered 2020-11-13: 3 [IU] via SUBCUTANEOUS
  Administered 2020-11-14: 4 [IU] via SUBCUTANEOUS

## 2020-11-13 MED ORDER — INSULIN REGULAR(HUMAN) IN NACL 100-0.9 UT/100ML-% IV SOLN
INTRAVENOUS | Status: DC
  Administered 2020-11-13: 1 [IU]/h via INTRAVENOUS
  Administered 2020-11-13: 11.5 [IU]/h via INTRAVENOUS
  Filled 2020-11-13: qty 100

## 2020-11-13 MED ORDER — POLYETHYLENE GLYCOL 3350 17 G PO PACK: 17.0000 g | PACK | Freq: Every day | ORAL | Status: DC | PRN

## 2020-11-13 MED ORDER — ASCORBIC ACID 500 MG PO TABS
500.0000 mg | ORAL_TABLET | Freq: Every day | ORAL | Status: DC
  Administered 2020-11-13 – 2020-11-16 (×4): 500 mg via ORAL
  Filled 2020-11-13 (×4): qty 1

## 2020-11-13 MED ORDER — DABIGATRAN ETEXILATE MESYLATE 150 MG PO CAPS
150.0000 mg | ORAL_CAPSULE | Freq: Two times a day (BID) | ORAL | Status: DC
  Administered 2020-11-13 – 2020-11-16 (×7): 150 mg via ORAL
  Filled 2020-11-13 (×10): qty 1

## 2020-11-13 MED ORDER — SODIUM CHLORIDE 0.9 % IV SOLN
100.0000 mg | Freq: Every day | INTRAVENOUS | Status: DC
  Administered 2020-11-14 – 2020-11-16 (×3): 100 mg via INTRAVENOUS
  Filled 2020-11-13 (×3): qty 20

## 2020-11-13 MED ORDER — DILTIAZEM HCL-DEXTROSE 125-5 MG/125ML-% IV SOLN (PREMIX)
5.0000 mg/h | INTRAVENOUS | Status: DC
  Administered 2020-11-13: 5 mg/h via INTRAVENOUS
  Filled 2020-11-13: qty 125

## 2020-11-13 MED ORDER — DILTIAZEM LOAD VIA INFUSION
10.0000 mg | Freq: Once | INTRAVENOUS | Status: AC
  Administered 2020-11-13: 10 mg via INTRAVENOUS
  Filled 2020-11-13: qty 10

## 2020-11-13 MED ORDER — DOCUSATE SODIUM 100 MG PO CAPS
100.0000 mg | ORAL_CAPSULE | Freq: Two times a day (BID) | ORAL | Status: DC
  Administered 2020-11-13 – 2020-11-16 (×4): 100 mg via ORAL
  Filled 2020-11-13 (×4): qty 1

## 2020-11-13 MED ORDER — ACETAMINOPHEN 325 MG PO TABS
650.0000 mg | ORAL_TABLET | Freq: Four times a day (QID) | ORAL | Status: DC | PRN
  Administered 2020-11-14: 650 mg via ORAL
  Filled 2020-11-13 (×2): qty 2

## 2020-11-13 MED ORDER — SODIUM CHLORIDE 0.9 % IV SOLN: 100.0000 mg | Freq: Every day | INTRAVENOUS | Status: DC

## 2020-11-13 MED ORDER — SODIUM CHLORIDE 0.9 % IV SOLN: 1.0000 g | Freq: Once | INTRAVENOUS | Status: DC

## 2020-11-13 MED ORDER — DEXTROSE 50 % IV SOLN: 0.0000 mL | INTRAVENOUS | Status: DC | PRN

## 2020-11-13 MED ORDER — SODIUM CHLORIDE 0.9 % IV SOLN
200.0000 mg | Freq: Once | INTRAVENOUS | Status: AC
  Administered 2020-11-13: 200 mg via INTRAVENOUS
  Filled 2020-11-13: qty 200

## 2020-11-13 MED ORDER — INSULIN GLARGINE 100 UNIT/ML ~~LOC~~ SOLN
24.0000 [IU] | Freq: Every day | SUBCUTANEOUS | Status: DC
  Administered 2020-11-13 – 2020-11-15 (×3): 24 [IU] via SUBCUTANEOUS
  Filled 2020-11-13 (×4): qty 0.24

## 2020-11-13 MED ORDER — ACETAMINOPHEN 325 MG PO TABS
650.0000 mg | ORAL_TABLET | Freq: Once | ORAL | Status: AC
  Administered 2020-11-13: 650 mg via ORAL
  Filled 2020-11-13: qty 2

## 2020-11-13 NOTE — H&P (Signed)
History and Physical        Hospital Admission Note Date: 11/13/2020  Patient name: Stephanie [name] Medical record number: [id number] Date of birth: 09/12/1951 Age: 70 y.o. Gender: female  PCP: Lilian Coma., MD    Patient coming from: Home  I have reviewed all records in the [place].    Chief Complaint:  Presented with increasing shortness of breath with exertion, fatigue, loss of appetite for a week  HPI: Patient is a 70 year old female with history of CAD, diabetes mellitus, IDDM, chronic diastolic CHF, persistent atrial fibrillation status post DCCV 07/2009 on Pradaxa, history of sarcoidosis presented to ED with increasing shortness of breath with exertion.  Patient reported that she started having symptoms after she went on a jury duty on January 18th and may have been exposed there as many people were congested and coughing.  She felt chills, fevers, loss of appetite myalgias and fatigue.  She started having shortness of breath, which has been progressively worsening.  Due to nausea and loss of appetite, she has not been taking her medications also.  Patient states she barely ate in the last 2 days. Patient was found to be in rapid atrial fibrillation with RVR, heart rate in 120s, was placed on Cardizem drip.  BP was lowest in 70s however improved to 113/89 at the time of my encounter.   Patient was also found to be in DKA with CBGs in 500s, was placed on IV insulin drip and IV fluid hydration COVID-19 test positive  Of note, patient is fully vaccinated with [place], third dose booster complete in November, 2021   ED work-up/course:   On 1/25 Sodium 129, potassium 4.8, chloride 95, bicarb 20, glucose 572, creatinine 1.84, anion gap 14 at the time of admission Stat BMET is pending Hemoglobin A1c 10.3 WBCs 5.5, hemoglobin 11.8 COVID-19 test positive,  inflammatory markers pending  Review of Systems: Positives marked in 'bold' Constitutional: + fever, chills, diaphoresis, poor appetite and fatigue.  HEENT: Denies photophobia, eye pain, redness, hearing loss, ear pain, + congestion, sore throat, rhinorrhea, sneezing, no mouth sores, trouble swallowing, neck pain, neck stiffness and tinnitus.   Respiratory: Please see HPI Cardiovascular: Please see HPI Gastrointestinal: + nausea, now vomiting, abdominal pain,+ constipation, blood in stool and abdominal distention.  Genitourinary: Denies dysuria, urgency, frequency, hematuria, flank pain and difficulty urinating.  Musculoskeletal: + myalgias, no back pain, joint swelling, arthralgias and gait problem.  Skin: Denies pallor, rash and wound.  Neurological: Denies dizziness, seizures, syncope, light-headedness, numbness and headaches. + Generalized weakness Hematological: Denies adenopathy. Easy bruising, personal or family bleeding history  Psychiatric/Behavioral: Denies suicidal ideation, mood changes, confusion, nervousness, sleep disturbance and agitation  Past Medical History: Past Medical History:  Diagnosis Date  . Anemia   . Ankle fracture, right    x 2  . Antineoplastic and immunosuppressive drugs causing adverse effect in therapeutic use   . Asthma   . Chronic venous hypertension without complications   . Colon polyps 2009  . Coronary artery disease    no CAD by cath 11.2010  . Depressive disorder, not elsewhere classified   . Diabetes mellitus type 2 in obese ([place])  hgb AIC 6.1 09/02/2009, insulin dependent   . Diastolic CHF, chronic (HCC)    reports stable  . Diverticulosis of colon 2009   colonoscopy  . Gout    cortisone  shots helped  . Hepatitis, unspecified    hx of/per pt, never was told of having hepatitis  . Hx of hysterectomy 2002  . Hyperlipidemia   . Hypertension    controlled on medication   . Hypothyroidism    reports her thryoid levels are normal now    . Irritable bowel syndrome   . Lung nodule    60mm RLL nodule on CT Chest  07/2009, resolved by 2011 CT  . Noncompliance   . Obesity   . Persistent atrial fibrillation ([place])    s/p DCCV 07/2009; Pradaxa Rx, states she is in sinus rhythm now   . PONV (postoperative nausea and vomiting)    after receiving "too much anesthesia" after a hernia surgery   . Sarcoidosis    dx by eye exam; seen during eye exam , report asymtptomatic "i dont have it now'   . Sleep disturbance 06/2013   sleep study, mild abnormality, no criteria for CPAP    Past Surgical History:  Procedure Laterality Date  . ABDOMINAL HYSTERECTOMY    . CARDIOVERSION     x 2  . CARDIOVERSION N/A 02/27/2013   Procedure: CARDIOVERSION;  Surgeon: Lelon Perla, MD;  Location: [facility] ENDOSCOPY;  Service: Cardiovascular;  Laterality: N/A;  . CARDIOVERSION N/A 03/24/2017   Procedure: CARDIOVERSION;  Surgeon: Josue Hector, MD;  Location[facility] ENDOSCOPY;  Service: Cardiovascular;  Laterality: N/A;  . CARDIOVERSION N/A 03/04/2020   Procedure: CARDIOVERSION;  Surgeon: Josue Hector, MD;  Location: [facility] ENDOSCOPY;  Service: Cardiovascular;  Laterality: N/A;  . CHOLECYSTECTOMY  early 27s  . COLONOSCOPY     Diverticulosis, colon polyps, repeat 2014, Dr. Deatra Ina  . COLONOSCOPY WITH PROPOFOL N/A 07/25/2019   Procedure: COLONOSCOPY WITH PROPOFOL;  Surgeon: Yetta Flock, MD;  Location: [facility] ENDOSCOPY;  Service: Gastroenterology;  Laterality: N/A;  . ESOPHAGOGASTRODUODENOSCOPY N/A 05/16/2014   Procedure: ESOPHAGOGASTRODUODENOSCOPY (EGD);  Surgeon: Wonda Horner, MD;  Location: [facility] ORS;  Service: Gastroenterology;  Laterality: N/A;  . HERNIA REPAIR  3016   umbilical hernia  . MYOMECTOMY  2000  . POLYPECTOMY  07/25/2019   Procedure: POLYPECTOMY;  Surgeon: Yetta Flock, MD;  Location: [facility] ENDOSCOPY;  Service: Gastroenterology;;    Medications: Prior to Admission medications   Medication Sig Start Date End Date Taking? Authorizing  Provider  carvedilol (COREG) 25 MG tablet TAKE 1 TABLET(25 MG) BY MOUTH TWICE DAILY Patient taking differently: Take 25 mg by mouth 2 (two) times daily with a meal. 12/25/19  Yes Deboraha Sprang, MD  dronedarone (MULTAQ) 400 MG tablet Take 1 tablet (400 mg total) by mouth 2 (two) times daily with a meal. 09/26/20  Yes Shirley Friar, PA-C  ENTRESTO 24-26 MG TAKE 1 TABLET BY MOUTH TWICE DAILY Patient taking differently: Take 1 tablet by mouth 2 (two) times daily. 12/05/19  Yes Deboraha Sprang, MD  furosemide (LASIX) 40 MG tablet TAKE 1.5 TABLETS BY MOUTH DAILY, IF AN ADDITIONAL WEIGHT GAIN OF 2-3 POUNDS TAKE 1 EXTRA TABLET Patient taking differently: Take 40 mg by mouth daily. May take 20 mg additional if needed 06/11/20  Yes Deboraha Sprang, MD  ibuprofen (ADVIL) 200 MG tablet Take 400 mg by mouth every 6 (six) hours as needed for headache or mild pain.    Yes  [provider]  Insulin Pen Needle (NOVOFINE) 30G X 8 MM MISC For Humalog meal time TID 08/22/18  Yes Tysinger, Camelia Eng, PA-C  JARDIANCE 10 MG TABS tablet Take 10 mg by mouth daily. 12/26/19  Yes [provider]  metFORMIN (GLUCOPHAGE) 500 MG tablet Take 500 mg by mouth 2 (two) times daily.  06/05/19  Yes [provider]  NOVOLOG FLEXPEN 100 UNIT/ML FlexPen Inject 13 Units into the skin 2 (two) times daily with a meal.  06/12/19  Yes [provider]  PRADAXA 150 MG CAPS capsule TAKE 1 CAPSULE BY MOUTH TWICE DAILY Patient taking differently: Take 150 mg by mouth 2 (two) times daily. 07/09/20  Yes Deboraha Sprang, MD  simvastatin (ZOCOR) 10 MG tablet Take 10 mg by mouth daily.  12/12/19  Yes [provider]  spironolactone (ALDACTONE) 25 MG tablet TAKE 1 TABLET(25 MG) BY MOUTH DAILY Patient taking differently: Take 25 mg by mouth daily. 05/10/20  Yes Deboraha Sprang, MD    Allergies:   Allergies  Allergen Reactions  . Amiodarone Other (See Comments)    adverse reaction: Tremor/gait disurbance  .  Aspirin Other (See Comments)    "Burns my stomach"  . Methimazole Nausea And Vomiting  . Propoxyphene Nausea And Vomiting  . Albuterol Palpitations  . Atorvastatin Other (See Comments)    Body aches and pains--stiffness.   [name] [Pitavastatin]     Body aches    Social History:  reports that she has never smoked. She has never used smokeless tobacco. She reports current alcohol use. She reports that she does not use drugs.  Family History: Family History  Problem Relation Age of Onset  . Pneumonia Father        deceased  . Hypertension Father   . Cancer Father   . Multiple sclerosis Mother        hx  . Emphysema Other        runs in the family    Physical Exam: Blood pressure 103/72, pulse 79, temperature 98 F (36.7 C), temperature source Oral, resp. rate 19, SpO2 98 %. General: Alert, awake, oriented x3, in no acute distress. Eyes: pink conjunctiva,anicteric sclera, pupils equal and reactive to light and accomodation, HEENT: normocephalic, atraumatic, oropharynx clear, dry mucosal membranes Neck: supple, no masses or lymphadenopathy, no goiter, no bruits, no JVD CVS: irregularly irregular, tachycardia Resp : Decreased breath sound at the bases, no wheezing GI : Soft, nontender, nondistended, positive bowel sounds, no masses. No hepatomegaly. No hernia.  Musculoskeletal: No clubbing or cyanosis, positive pedal pulses. No contracture. ROM intact  Neuro: Grossly intact, no focal neurological deficits, strength 5/5 upper and lower extremities bilaterally Psych: alert and oriented x 3, normal mood and affect Skin: no rashes or lesions, warm and dry   LABS on Admission: I have personally reviewed all the labs and imagings below    Basic Metabolic Panel: Recent Labs  Lab 11/12/20 1727 11/13/20 0813  NA 129* 130*  K 4.8 3.9  CL 95*  --   CO2 20*  --   GLUCOSE 572*  --   BUN 35*  --   CREATININE 1.84*  --   CALCIUM 9.7  --    Liver Function Tests: No results for  input(s): AST, ALT, ALKPHOS, BILITOT, PROT, ALBUMIN in the last 168 hours. No results for input(s): LIPASE, AMYLASE in the last 168 hours. No results for input(s): AMMONIA in the last 168 hours. CBC: Recent Labs  Lab 11/12/20 1727  11/13/20 0813  WBC 5.5  --   HGB 11.8* 11.2*  HCT 36.8 33.0*  MCV 93.6  --   PLT 250  --    Cardiac Enzymes: No results for input(s): CKTOTAL, CKMB, CKMBINDEX, TROPONINI in the last 168 hours. BNP: Invalid input(s): POCBNP CBG: Recent Labs  Lab 11/13/20 0625 11/13/20 0737  GLUCAP >600* 544*    Radiological Exams on Admission:  DG Chest 2 View  Result Date: 11/12/2020 CLINICAL DATA:  Shortness of breath for several days. Sarcoidosis. Atrial fibrillation. EXAM: CHEST - 2 VIEW COMPARISON:  03/02/2018 FINDINGS: The heart size and mediastinal contours are within normal limits. Both lungs are clear. The visualized skeletal structures are unremarkable. IMPRESSION: No active cardiopulmonary disease. Electronically Signed   By: Marlaine Hind M.D.   On: 11/12/2020 18:22   DG Cervical Spine 1 View  Result Date: 11/13/2020 CLINICAL DATA:  70 year old female with shortness of breath. Query abnormal epiglottis. EXAM: DG CERVICAL SPINE - 1 VIEW COMPARISON:  Neck radiographs 05/16/2014. FINDINGS: The epiglottis contour is normal, stable since 2015. The hypopharynx is distended with gas today. Negative visible trachea. Other pharyngeal soft tissue contours appear normal, with no retropharyngeal or prevertebral thickening. Advanced cervical spine degeneration. IMPRESSION: 1. Epiglottis is normal. 2. Hypopharynx distended with gas, which can be seen with laryngotracheobronchitis (croup). Electronically Signed   By: Genevie Ann M.D.   On: 11/13/2020 06:26      EKG: Independently reviewed.  Rate 117 atrial flutter with 3: 1 AV block, QTc 478    Assessment/Plan Principal Problem:   Acute respiratory disease due to COVID-19 virus -COVID-19 test positive, symptomatic, with  shortness of breath, myalgias, loss of appetite, dehydration.  Chest x-ray showed lungs are clear -will place on IV remdesivir per pharmacy protocol.  Needs as no hypoxia, O2 sats 97% on room air -Ordered inflammatory markers, will follow -Placed on antitussives, vitamin C, zinc, incentive spirometry   Active Problems:   Atrial fibrillation with rapid ventricular response (HCC) -Likely precipitated due to #1 and not taking her medications (nausea, loss of appetite) -Patient was placed on IV Cardizem drip, will continue for goal heart rate less than 100 -Resume Multaq at outpatient dose 400 mg twice daily -Continue Pradaxa, cardiology consulted, patient follows Dr. [name], follow cardiology recommendations -Holding off on oral beta-blocker as patient is on Cardizem drip, BP is borderline soft     Essential hypertension -BP is borderline soft, holding off on Lasix, Entresto, Coreg, Aldactone -will follow cardiology recommendations    Chronic combined systolic and diastolic heart failure ([place]), NICM -Compensated, currently appears somewhat dehydrated, hold off on Lasix, Entresto, spironolactone -2D echo 05/2020 had shown EF of 45 to 50% with global hypokinesis, indeterminate diastolic parameters    AKI (acute kidney injury) ([place]) on CKD stage IIIa -Baseline creatinine 1.1-1.2 -Presented with creatinine of 1.8, likely due to #1, dehydration, medications -Currently on IV fluids due to DKA, will continue, follow BMET -Hold off on Lasix and spironolactone     Hyperosmolar hyperglycemic state (HHS) ([place]) in the setting of uncontrolled diabetes mellitus -Patient had not been taking her medications in the last few days, CBG 572 on arrival, bicarb 20, anion gap 14 -Started on IV insulin drip, IV fluid hydration -Hemoglobin A1c 10.3, diabetic coordinator consulted   DVT prophylaxis: On Pradaxa  CODE STATUS: Full CODE STATUS, addressed with the patient  Consults called: Cardiology  Family  Communication: Admission, patients condition and plan of care including tests being ordered have been discussed with the  patient  who indicates understanding and agree with the plan and Code Status  Admission status:   The medical decision making on this patient was of high complexity and the patient is at high risk for clinical deterioration, therefore this is a level 3 admission.  Severity of Illness:      The appropriate patient status for this patient is INPATIENT. Inpatient status is judged to be reasonable and necessary in order to provide the required intensity of service to ensure the patient's safety. The patient's presenting symptoms, physical exam findings, and initial radiographic and laboratory data in the context of their chronic comorbidities is felt to place them at high risk for further clinical deterioration. Furthermore, it is not anticipated that the patient will be medically stable for discharge from the hospital within 2 midnights of admission. The following factors support the patient status of inpatient.   " The patient's presenting symptoms include shortness of breath with fevers, congestion, myalgias.  Dehydration, elevated blood sugars " The worrisome physical exam findings include shortness of breath with rapid heart rate " The initial radiographic and laboratory data are worrisome because of COVID-19 positive, atrial fibrillation with RVR, acute kidney injury, hyperosmolar hyperglycemia " The chronic co-morbidities include chronic systolic/diastolic heart failure, uncontrolled diabetes mellitus   * I certify that at the point of admission it is my clinical judgment that the patient will require inpatient hospital care spanning beyond 2 midnights from the point of admission due to high intensity of service, high risk for further deterioration and high frequency of surveillance required.*    Time Spent on Admission: 53mins      Stevee Valenta M.D. [facility] 11/13/2020, 8:44 AM

## 2020-11-13 NOTE — ED Notes (Signed)
Pt difficult stick, cant hang insulin drip with cardizem, iv team notified.

## 2020-11-13 NOTE — Progress Notes (Addendum)
Inpatient Diabetes Program Recommendations  AACE/ADA: New Consensus Statement on Inpatient Glycemic Control (2015)  Target Ranges:  Prepandial:   less than 140 mg/dL      Peak postprandial:   less than 180 mg/dL (1-2 hours)      Critically ill patients:  140 - 180 mg/dL   Lab Results  Component Value Date   GLUCAP 317 (H) 11/13/2020   HGBA1C 11.8 (A) 08/22/2018    Review of Glycemic Control  Diabetes history: DM 2 Outpatient Diabetes medications: Jardiance 10 mg Daily, Metformin 500 mg bid, Novolog 13 bid with meals Current orders for Inpatient glycemic control:  IV insulin (11 units/hour)  Inpatient Diabetes Program Recommendations:    -  Consider Lantus 24 units   -  Novolog 0-15 units tid + hs  Glucose 572 on presentation. Will speak with pt this admission.  Addendum 1200 pm: Spoke with pt over the phone regarding glucose control at home and A1c 10.3%. Pt reports seeing Dr. Buddy Duty, Endocrinologist outpatient for DM management and last saw him in December. Follow up within the next month or so. Pt reports having her A1c at goal before this admission. Reviewed A1c level with pt. Pt reports checking glucose 1-2 times a week. Encouraged pt to check glucose 2 times a day especially now while getting treatment for COVID virus. Pt reports not taking medication as prescribed as she was too sick to take it. Encouraged Endocrinology follow up.   Thanks,  Tama Headings RN, MSN, BC-ADM Inpatient Diabetes Coordinator Team Pager [phone] (8a-5p)

## 2020-11-13 NOTE — ED Notes (Signed)
Per ED pharm, hold remdesivir until a line becomes available. Pt on insulin drip and cardizem at this time

## 2020-11-13 NOTE — ED Provider Notes (Addendum)
[facility] EMERGENCY DEPARTMENT Provider Note   CSN: [id number] Arrival date & time: 11/12/20  1651     History Chief Complaint  Patient presents with  . Shortness of Breath    Stephanie [name] is a 70 y.o. female.  The history is provided by the patient.  Shortness of Breath Severity:  Moderate Onset quality:  Gradual Duration:  4 days Timing:  Constant Progression:  Worsening Chronicity:  New Context: activity   Relieved by:  Nothing Worsened by:  Nothing Ineffective treatments:  None tried Associated symptoms: cough, fever and sore throat   Associated symptoms: no abdominal pain and no rash   Associated symptoms comment:  Palpitations  Risk factors: no recent alcohol use   Patient with CAD who is covid vaccinated presents with several days of dyspnea on exertion and palpitations.  She states she serving on a jury and may have been exposed as many people were coughing.       Past Medical History:  Diagnosis Date  . Anemia   . Ankle fracture, right    x 2  . Antineoplastic and immunosuppressive drugs causing adverse effect in therapeutic use   . Asthma   . Chronic venous hypertension without complications   . Colon polyps 2009  . Coronary artery disease    no CAD by cath 11.2010  . Depressive disorder, not elsewhere classified   . Diabetes mellitus type 2 in obese (HCC)    hgb AIC 6.1 09/02/2009, insulin dependent   . Diastolic CHF, chronic (HCC)    reports stable  . Diverticulosis of colon 2009   colonoscopy  . Gout    cortisone  shots helped  . Hepatitis, unspecified    hx of/per pt, never was told of having hepatitis  . Hx of hysterectomy 2002  . Hyperlipidemia   . Hypertension    controlled on medication   . Hypothyroidism    reports her thryoid levels are normal now   . Irritable bowel syndrome   . Lung nodule    19mm RLL nodule on CT Chest  07/2009, resolved by 2011 CT  . Noncompliance   . Obesity   . Persistent atrial  fibrillation ([place])    s/p DCCV 07/2009; Pradaxa Rx, states she is in sinus rhythm now   . PONV (postoperative nausea and vomiting)    after receiving "too much anesthesia" after a hernia surgery   . Sarcoidosis    dx by eye exam; seen during eye exam , report asymtptomatic "i dont have it now'   . Sleep disturbance 06/2013   sleep study, mild abnormality, no criteria for CPAP    Patient Active Problem List   Diagnosis Date Noted  . Persistent atrial fibrillation ([place]) 09/23/2020  . Secondary hypercoagulable state ([place]) 09/04/2020  . NICM (nonischemic cardiomyopathy) ([place]) 12/20/2019  . History of colonic polyps   . Benign neoplasm of ascending colon   . Hyperglycemia 06/22/2018  . Polyuria 06/22/2018  . Polydipsia 06/22/2018  . Blurred vision 06/22/2018  . Weight loss 06/22/2018  . Medication side effect, initial encounter 03/23/2018  . Venous stasis dermatitis 03/09/2018  . Skin lesion 03/09/2018  . History of behavioral and mental health problems 03/02/2018  . Degenerative arthritis of knee, bilateral 12/22/2017  . Chronic pain of both knees 11/25/2017  . Abdominal pain 06/04/2017  . Nausea 06/04/2017  . Poor sleep hygiene 06/04/2017  . Noncompliance 06/04/2017  . Thyroid disease 05/19/2017  . Typical atrial flutter ([place])   .  Thyrotoxicosis 11/24/2016  . Atrial fibrillation with rapid ventricular response ([place]) 11/12/2016  . Acute on chronic systolic CHF (congestive heart failure) ([place]) 10/18/2016  . Hypokalemia 10/18/2016  . Need for prophylactic vaccination against Streptococcus pneumoniae (pneumococcus) 08/20/2016  . IDDM (insulin dependent diabetes mellitus) 05/12/2016  . Edema 05/12/2016  . B12 deficiency 05/06/2016  . Normocytic anemia 04/15/2016  . Insomnia 01/21/2016  . Pelvic pain in female 08/06/2015  . Screening for breast cancer 08/06/2015  . History of anemia 08/02/2015  . Other specified nutritional anemias 08/02/2015  . Urinary pain 08/02/2015  . Need  for prophylactic vaccination and inoculation against influenza 08/02/2015  . Obesity 08/02/2015  . Dyslipidemia 08/02/2015  . History of sarcoidosis 08/02/2015  . Generalized anxiety disorder 08/02/2015  . Gout 08/02/2015  . Abnormal thyroid blood test 08/02/2015  . Physical exam 08/02/2015  . Needs flu shot 08/02/2015  . Dysthymic disorder 08/02/2015  . Ataxia 08/22/2014  . Cardiomyopathy, secondary ([place]) 01/28/2011  . CHRONIC DIASTOLIC HEART FAILURE 24/40/1027  . Essential hypertension 09/02/2009  . Atrial fibrillation ([place]) 08/20/2009  . Lung nodule 08/20/2009  . CHRONIC VENOUS HYPERTENSION WITHOUT COMPS 11/21/2007    Past Surgical History:  Procedure Laterality Date  . ABDOMINAL HYSTERECTOMY    . CARDIOVERSION     x 2  . CARDIOVERSION N/A 02/27/2013   Procedure: CARDIOVERSION;  Surgeon: Lelon Perla, MD;  Location[facility] ENDOSCOPY;  Service: Cardiovascular;  Laterality: N/A;  . CARDIOVERSION N/A 03/24/2017   Procedure: CARDIOVERSION;  Surgeon: Josue Hector, MD;  Location: [facility] ENDOSCOPY;  Service: Cardiovascular;  Laterality: N/A;  . CARDIOVERSION N/A 03/04/2020   Procedure: CARDIOVERSION;  Surgeon: Josue Hector, MD;  Location: [facility] ENDOSCOPY;  Service: Cardiovascular;  Laterality: N/A;  . CHOLECYSTECTOMY  early 84s  . COLONOSCOPY     Diverticulosis, colon polyps, repeat 2014, Dr. Deatra Ina  . COLONOSCOPY WITH PROPOFOL N/A 07/25/2019   Procedure: COLONOSCOPY WITH PROPOFOL;  Surgeon: Yetta Flock, MD;  Location: [facility] ENDOSCOPY;  Service: Gastroenterology;  Laterality: N/A;  . ESOPHAGOGASTRODUODENOSCOPY N/A 05/16/2014   Procedure: ESOPHAGOGASTRODUODENOSCOPY (EGD);  Surgeon: Wonda Horner, MD;  Location: [facility] ORS;  Service: Gastroenterology;  Laterality: N/A;  . HERNIA REPAIR  2536   umbilical hernia  . MYOMECTOMY  2000  . POLYPECTOMY  07/25/2019   Procedure: POLYPECTOMY;  Surgeon: Yetta Flock, MD;  Location: [facility] ENDOSCOPY;  Service: Gastroenterology;;     OB History    No obstetric history on file.     Family History  Problem Relation Age of Onset  . Pneumonia Father        deceased  . Hypertension Father   . Cancer Father   . Multiple sclerosis Mother        hx  . Emphysema Other        runs in the family    Social History   Tobacco Use  . Smoking status: Never Smoker  . Smokeless tobacco: Never Used  Vaping Use  . Vaping Use: Never used  Substance Use Topics  . Alcohol use: Yes    Alcohol/week: 0.0 standard drinks    Comment: rare  . Drug use: No    Home Medications Prior to Admission medications   Medication Sig Start Date End Date Taking? Authorizing Provider  carvedilol (COREG) 25 MG tablet TAKE 1 TABLET(25 MG) BY MOUTH TWICE DAILY Patient taking differently: Take 25 mg by mouth 2 (two) times daily with a meal. 12/25/19   Stephanie Sprang, MD  colchicine 0.6  MG tablet Take 1 tablet (0.6 mg total) by mouth daily. 06/25/20   Lyndal Pulley, DO  dronedarone (MULTAQ) 400 MG tablet Take 1 tablet (400 mg total) by mouth 2 (two) times daily with a meal. 09/26/20   Shirley Friar, PA-C  ENTRESTO 24-26 MG TAKE 1 TABLET BY MOUTH TWICE DAILY 12/05/19   Stephanie Sprang, MD  furosemide (LASIX) 40 MG tablet TAKE 1.5 TABLETS BY MOUTH DAILY, IF AN ADDITIONAL WEIGHT GAIN OF 2-3 POUNDS TAKE 1 EXTRA TABLET Patient taking differently: Take 40 mg by mouth daily. May take 20 mg additional if needed 06/11/20   Stephanie Sprang, MD  ibuprofen (ADVIL) 200 MG tablet Take 400 mg by mouth every 6 (six) hours as needed for headache or mild pain.     [provider]  Insulin Pen Needle (NOVOFINE) 30G X 8 MM MISC For Humalog meal time TID 08/22/18   Tysinger, Camelia Eng, PA-C  JARDIANCE 10 MG TABS tablet Take 10 mg by mouth daily. 12/26/19   [provider]  metFORMIN (GLUCOPHAGE) 500 MG tablet Take 500 mg by mouth 2 (two) times daily.  06/05/19   [provider]  NOVOLOG FLEXPEN 100 UNIT/ML FlexPen Inject 13 Units into the skin 2 (two)  times daily with a meal.  06/12/19   [provider]  PRADAXA 150 MG CAPS capsule TAKE 1 CAPSULE BY MOUTH TWICE DAILY 07/09/20   Stephanie Sprang, MD  simvastatin (ZOCOR) 10 MG tablet Take 10 mg by mouth daily.  12/12/19   [provider]  spironolactone (ALDACTONE) 25 MG tablet TAKE 1 TABLET(25 MG) BY MOUTH DAILY Patient taking differently: Take 25 mg by mouth daily. 05/10/20   Stephanie Sprang, MD    Allergies    Amiodarone, Aspirin, Methimazole, Propoxyphene, Albuterol, Atorvastatin, and Livalo [pitavastatin]  Review of Systems   Review of Systems  Constitutional: Positive for fever.  HENT: Positive for sore throat.   Eyes: Negative for visual disturbance.  Respiratory: Positive for cough and shortness of breath.   Cardiovascular: Positive for palpitations.  Gastrointestinal: Negative for abdominal pain.  Genitourinary: Negative for difficulty urinating.  Musculoskeletal: Negative for arthralgias.  Skin: Negative for rash.  Neurological: Negative for dizziness.  Psychiatric/Behavioral: Negative for agitation.  All other systems reviewed and are negative.   Physical Exam Updated Vital Signs BP 121/63 (BP Location: Left Arm)   Pulse 72   Temp 98 F (36.7 C) (Oral)   Resp 18   SpO2 99%   Physical Exam Vitals and nursing note reviewed.  Constitutional:      Appearance: Normal appearance.  HENT:     Head: Normocephalic and atraumatic.     Nose: Nose normal.  Eyes:     Conjunctiva/sclera: Conjunctivae normal.     Pupils: Pupils are equal, round, and reactive to light.  Cardiovascular:     Rate and Rhythm: Tachycardia present. Rhythm irregular.     Pulses: Normal pulses.     Heart sounds: Normal heart sounds.  Pulmonary:     Breath sounds: Decreased breath sounds present. No rales.  Abdominal:     General: Abdomen is flat. Bowel sounds are normal.     Palpations: Abdomen is soft.     Tenderness: There is no abdominal tenderness. There is no guarding.   Musculoskeletal:        General: Normal range of motion.     Cervical back: Normal range of motion and neck supple.  Skin:    General: Skin  is warm and dry.     Capillary Refill: Capillary refill takes less than 2 seconds.  Neurological:     General: No focal deficit present.     Mental Status: She is alert and oriented to person, place, and time.     Deep Tendon Reflexes: Reflexes normal.  Psychiatric:        Mood and Affect: Mood normal.        Behavior: Behavior normal.     ED Results / Procedures / Treatments   Labs (all labs ordered are listed, but only abnormal results are displayed) Results for orders placed or performed during the hospital encounter of [id number]  Basic metabolic panel  Result Value Ref Range   Sodium 129 (L) 135 - 145 mmol/L   Potassium 4.8 3.5 - 5.1 mmol/L   Chloride 95 (L) 98 - 111 mmol/L   CO2 20 (L) 22 - 32 mmol/L   Glucose, Bld 572 (HH) 70 - 99 mg/dL   BUN 35 (H) 8 - 23 mg/dL   Creatinine, Ser 1.84 (H) 0.44 - 1.00 mg/dL   Calcium 9.7 8.9 - 10.3 mg/dL   GFR, Estimated 30 (L) >60 mL/min   Anion gap 14 5 - 15  CBC  Result Value Ref Range   WBC 5.5 4.0 - 10.5 K/uL   RBC 3.93 3.87 - 5.11 MIL/uL   Hemoglobin 11.8 (L) 12.0 - 15.0 g/dL   HCT 36.8 36.0 - 46.0 %   MCV 93.6 80.0 - 100.0 fL   MCH 30.0 26.0 - 34.0 pg   MCHC 32.1 30.0 - 36.0 g/dL   RDW 13.2 11.5 - 15.5 %   Platelets 250 150 - 400 K/uL   nRBC 0.0 0.0 - 0.2 %  CBG monitoring, ED  Result Value Ref Range   Glucose-Capillary >600 (HH) 70 - 99 mg/dL   DG Chest 2 View  Result Date: 11/12/2020 CLINICAL DATA:  Shortness of breath for several days. Sarcoidosis. Atrial fibrillation. EXAM: CHEST - 2 VIEW COMPARISON:  03/02/2018 FINDINGS: The heart size and mediastinal contours are within normal limits. Both lungs are clear. The visualized skeletal structures are unremarkable. IMPRESSION: No active cardiopulmonary disease. Electronically Signed   By: Marlaine Hind M.D.   On: 11/12/2020 18:22     EKG EKG Interpretation  Date/Time:  Tuesday November 12 2020 17:25:10 EST Ventricular Rate:  123 PR Interval:    QRS Duration: 106 QT Interval:  270 QTC Calculation: 386 R Axis:   -39 Text Interpretation: Atrial fibrillation with rapid ventricular response Left axis deviation Moderate voltage criteria for LVH, may be normal variant ( R in aVL , Cornell product ) Confirmed by Randal Buba, Kanya Potteiger ([id number]) on 11/13/2020 3:38:33 AM   Radiology DG Chest 2 View  Result Date: 11/12/2020 CLINICAL DATA:  Shortness of breath for several days. Sarcoidosis. Atrial fibrillation. EXAM: CHEST - 2 VIEW COMPARISON:  03/02/2018 FINDINGS: The heart size and mediastinal contours are within normal limits. Both lungs are clear. The visualized skeletal structures are unremarkable. IMPRESSION: No active cardiopulmonary disease. Electronically Signed   By: Marlaine Hind M.D.   On: 11/12/2020 18:22    Procedures Procedures   Medications Ordered in ED Medications  diltiazem (CARDIZEM) 1 mg/mL load via infusion 10 mg (has no administration in time range)    And  diltiazem (CARDIZEM) 125 mg in dextrose 5% 125 mL (1 mg/mL) infusion (has no administration in time range)  insulin regular, human (MYXREDLIN) 100 units/ 100 mL infusion (has no administration in time  range)  lactated ringers infusion (has no administration in time range)  dextrose 5 % in lactated ringers infusion (has no administration in time range)  dextrose 50 % solution 0-50 mL (has no administration in time range)   MDM Reviewed: previous chart, nursing note and vitals Reviewed previous: labs Interpretation: labs, ECG and x-ray (hyperglycemia, NACPD on CXR by me ) Total time providing critical care: 30-74 minutes (diltiazem and insulin drips ). This excludes time spent performing separately reportable procedures and services. Consults: admitting MD  CRITICAL CARE Performed by: Javed Cotto K Liv Rallis-Rasch Total critical care time: 60  minutes Critical care time was exclusive of separately billable procedures and treating other patients. Critical care was necessary to treat or prevent imminent or life-threatening deterioration. Critical care was time spent personally by me on the following activities: development of treatment plan with patient and/or surrogate as well as nursing, discussions with consultants, evaluation of patient's response to treatment, examination of patient, obtaining history from patient or surrogate, ordering and performing treatments and interventions, ordering and review of laboratory studies, ordering and review of radiographic studies, pulse oximetry and re-evaluation of patient's condition.  ED Course  I have reviewed the triage vital signs and the nursing notes.  Pertinent labs & imaging results that were available during my care of the patient were reviewed by me and considered in my medical decision making (see chart for details).    Semaj Schmitz was evaluated in Emergency Department on 11/13/2020 for the symptoms described in the history of present illness. She was evaluated in the context of the global COVID-19 pandemic, which necessitated consideration that the patient might be at risk for infection with the SARS-CoV-2 virus that causes COVID-19. Institutional protocols and algorithms that pertain to the evaluation of patients at risk for COVID-19 are in a state of rapid change based on information released by regulatory bodies including the CDC and federal and state organizations. These policies and algorithms were followed during the patient's care in the ED.  Final Clinical Impression(s) / ED Diagnoses Final diagnoses:  Atrial fibrillation, unspecified type ([place])  SOB (shortness of breath)  Hyperglycemia   Admit to medicine  Admit to medicine    Geovanna Simko, MD 11/13/20 [place], Raiquan Chandler, MD 11/13/20 [id number]

## 2020-11-13 NOTE — ED Notes (Signed)
I stuck pt once to get blood work. However after my attempt she told me that she does not want to be stuck again. Pt Said "she is been stuck all day and is tiered of all the blood work."

## 2020-11-13 NOTE — Consult Note (Signed)
Cardiology Consultation:   Due to the COVID-19 pandemic, this visit was completed with telemedicine (audio/video) technology to reduce patient and provider exposure as well as to preserve personal protective equipment.   Patient ID: Stephanie [name] MRN: [id number]; DOB: 06-10-1951  Admit date: 11/12/2020 Date of Consult: 11/13/2020  Primary Care Provider: Lilian Coma., MD Primary Electrophysiologist:  [name], MD   Patient Profile:   Stephanie [name] is a 70 y.o. female with a hx of HTN, tachycardia mediated CM with most recent EF at 45-50% 05/2020, HLD, sarcoidosis, DM2, and persistent atrial fibrillation on [facility] who is being seen today for the evaluation of AF with RVR at the request of Stephanie [name].   History of Present Illness:   Stephanie [name] is a 65yo F with a hx as stated above who presented to [facility] 11/13/20 with worsening SOB, fatigue and loss of appetite that began after participating in jury duty 11/05/20 where she may have been exposed. Given her symptoms she presented for further evaluation and was found to be COVID positive on presentation. During her workup, EKG performed which showed AF with RVR. She was treated with IV diltiazem. She was also found to be in DKA with a BS in the 500's. She was there fore placed in IV insulin.  Pertinent labwork as follows: Creatinine, 1.84, Na+, 129,    She has a hx of AF diagnosed initially in 2014 treated with DCCV. She had repeat cardioversions in 2018 and lastly in 02/2020. She was seen for atrial fibrillation ablation consideration in 2015 however this was deferred. Echocardiogram performed at that time showed an LVEF at 15-20% with improvement on follow up study to 55% however most recent echo 05/2020 with mild reduction to 45-50%. She was previously maintained on amiodarone but this was stopped due to tremors and hyperthyroidism. She has been anticoagulated with Pradaxa due to a CHA2DS2VASc score of 4. On follow up 03/2020 she was found to be back  in AF on evaluation. There was mention of starting dofetilide however this was deferred due to COVID-19 and the need for beds.   Dofetilide loading was then attempted 09/23/20 however she had QT prolongation and the medication was ultimately stopped. She eventually converted to NSR after the first dose and was then started on Multaq. On last follow up with AF clinic 10/03/2020 she was back in rate controlled AF. Plan at that time was to attempt DCCV once again.   Past Medical History:  Diagnosis Date  . Anemia   . Ankle fracture, right    x 2  . Antineoplastic and immunosuppressive drugs causing adverse effect in therapeutic use   . Asthma   . Chronic venous hypertension without complications   . Colon polyps 2009  . Coronary artery disease    no CAD by cath 11.2010  . Depressive disorder, not elsewhere classified   . Diabetes mellitus type 2 in obese (HCC)    hgb AIC 6.1 09/02/2009, insulin dependent   . Diastolic CHF, chronic (HCC)    reports stable  . Diverticulosis of colon 2009   colonoscopy  . Gout    cortisone  shots helped  . Hepatitis, unspecified    hx of/per pt, never was told of having hepatitis  . Hx of hysterectomy 2002  . Hyperlipidemia   . Hypertension    controlled on medication   . Hypothyroidism    reports her thryoid levels are normal now   . Irritable bowel syndrome   . Lung nodule  61mm RLL nodule on CT Chest  07/2009, resolved by 2011 CT  . Noncompliance   . Obesity   . Persistent atrial fibrillation ([place])    s/p DCCV 07/2009; Pradaxa Rx, states she is in sinus rhythm now   . PONV (postoperative nausea and vomiting)    after receiving "too much anesthesia" after a hernia surgery   . Sarcoidosis    dx by eye exam; seen during eye exam , report asymtptomatic "i dont have it now'   . Sleep disturbance 06/2013   sleep study, mild abnormality, no criteria for CPAP    Past Surgical History:  Procedure Laterality Date  . ABDOMINAL HYSTERECTOMY    .  CARDIOVERSION     x 2  . CARDIOVERSION N/A 02/27/2013   Procedure: CARDIOVERSION;  Surgeon: Lelon Perla, MD;  Location: [facility] ENDOSCOPY;  Service: Cardiovascular;  Laterality: N/A;  . CARDIOVERSION N/A 03/24/2017   Procedure: CARDIOVERSION;  Surgeon: Josue Hector, MD;  Location: [facility] ENDOSCOPY;  Service: Cardiovascular;  Laterality: N/A;  . CARDIOVERSION N/A 03/04/2020   Procedure: CARDIOVERSION;  Surgeon: Josue Hector, MD;  Location: [facility] ENDOSCOPY;  Service: Cardiovascular;  Laterality: N/A;  . CHOLECYSTECTOMY  early 42s  . COLONOSCOPY     Diverticulosis, colon polyps, repeat 2014, Dr. Deatra Ina  . COLONOSCOPY WITH PROPOFOL N/A 07/25/2019   Procedure: COLONOSCOPY WITH PROPOFOL;  Surgeon: Yetta Flock, MD;  Location: [facility] ENDOSCOPY;  Service: Gastroenterology;  Laterality: N/A;  . ESOPHAGOGASTRODUODENOSCOPY N/A 05/16/2014   Procedure: ESOPHAGOGASTRODUODENOSCOPY (EGD);  Surgeon: Wonda Horner, MD;  Location: [facility] ORS;  Service: Gastroenterology;  Laterality: N/A;  . HERNIA REPAIR  [id number]   umbilical hernia  . MYOMECTOMY  2000  . POLYPECTOMY  07/25/2019   Procedure: POLYPECTOMY;  Surgeon: Yetta Flock, MD;  Location: [facility] ENDOSCOPY;  Service: Gastroenterology;;     Home Medications:  Prior to Admission medications   Medication Sig Start Date End Date Taking? Authorizing Provider  carvedilol (COREG) 25 MG tablet TAKE 1 TABLET(25 MG) BY MOUTH TWICE DAILY Patient taking differently: Take 25 mg by mouth 2 (two) times daily with a meal. 12/25/19  Yes Deboraha Sprang, MD  dronedarone (MULTAQ) 400 MG tablet Take 1 tablet (400 mg total) by mouth 2 (two) times daily with a meal. 09/26/20  Yes Shirley Friar, PA-C  ENTRESTO 24-26 MG TAKE 1 TABLET BY MOUTH TWICE DAILY Patient taking differently: Take 1 tablet by mouth 2 (two) times daily. 12/05/19  Yes Deboraha Sprang, MD  furosemide (LASIX) 40 MG tablet TAKE 1.5 TABLETS BY MOUTH DAILY, IF AN ADDITIONAL WEIGHT GAIN OF 2-3 POUNDS TAKE 1 EXTRA  TABLET Patient taking differently: Take 40 mg by mouth daily. May take 20 mg additional if needed 06/11/20  Yes Deboraha Sprang, MD  ibuprofen (ADVIL) 200 MG tablet Take 400 mg by mouth every 6 (six) hours as needed for headache or mild pain.    Yes [provider]  Insulin Pen Needle (NOVOFINE) 30G X 8 MM MISC For Humalog meal time TID 08/22/18  Yes Tysinger, Camelia Eng, PA-C  JARDIANCE 10 MG TABS tablet Take 10 mg by mouth daily. 12/26/19  Yes [provider]  metFORMIN (GLUCOPHAGE) 500 MG tablet Take 500 mg by mouth 2 (two) times daily.  06/05/19  Yes [provider]  NOVOLOG FLEXPEN 100 UNIT/ML FlexPen Inject 13 Units into the skin 2 (two) times daily with a meal.  06/12/19  Yes [provider]  PRADAXA 150 MG CAPS capsule  TAKE 1 CAPSULE BY MOUTH TWICE DAILY Patient taking differently: Take 150 mg by mouth 2 (two) times daily. 07/09/20  Yes Deboraha Sprang, MD  simvastatin (ZOCOR) 10 MG tablet Take 10 mg by mouth daily.  12/12/19  Yes [provider]  spironolactone (ALDACTONE) 25 MG tablet TAKE 1 TABLET(25 MG) BY MOUTH DAILY Patient taking differently: Take 25 mg by mouth daily. 05/10/20  Yes Deboraha Sprang, MD    Inpatient Medications: Scheduled Meds: . vitamin C  500 mg Oral Daily  . dabigatran  150 mg Oral BID  . docusate sodium  100 mg Oral BID  . zinc sulfate  220 mg Oral Daily   Continuous Infusions: . dextrose 5% lactated ringers 125 mL/hr at 11/13/20 1120  . diltiazem (CARDIZEM) infusion 5 mg/hr (11/13/20 0925)  . insulin 4.2 Units/hr (11/13/20 1432)  . lactated ringers    . lactated ringers 125 mL/hr at 11/13/20 0928  . remdesivir 200 mg in sodium chloride 0.9% 250 mL IVPB     Followed by  . [START ON 11/14/2020] remdesivir 100 mg in NS 100 mL     PRN Meds: acetaminophen, chlorpheniramine-HYDROcodone, dextrose, ondansetron **OR** ondansetron (ZOFRAN) IV, polyethylene glycol  Allergies:    Allergies  Allergen Reactions  . Amiodarone  Other (See Comments)    adverse reaction: Tremor/gait disurbance  . Aspirin Other (See Comments)    "Burns my stomach"  . Methimazole Nausea And Vomiting  . Propoxyphene Nausea And Vomiting  . Albuterol Palpitations  . Atorvastatin Other (See Comments)    Body aches and pains--stiffness.   [name] [Pitavastatin]     Body aches    Social History:   Social History   Socioeconomic History  . Marital status: Single    Spouse name: Not on file  . Number of children: 5  . Years of education: college  . Highest education level: Not on file  Occupational History  . Occupation: Disabled.  Tobacco Use  . Smoking status: Never Smoker  . Smokeless tobacco: Never Used  Vaping Use  . Vaping Use: Never used  Substance and Sexual Activity  . Alcohol use: Yes    Alcohol/week: 0.0 standard drinks    Comment: rare  . Drug use: No  . Sexual activity: Not Currently    Birth control/protection: Surgical  Other Topics Concern  . Not on file  Social History Narrative   Pt lives in [place] with her son.  Unemployed.  Right handed.     Education college   Social Determinants of Radio broadcast assistant Strain: Not on file  Food Insecurity: Not on file  Transportation Needs: Not on file  Physical Activity: Not on file  Stress: Not on file  Social Connections: Not on file  Intimate Partner Violence: Not on file    Family History:    Family History  Problem Relation Age of Onset  . Pneumonia Father        deceased  . Hypertension Father   . Cancer Father   . Multiple sclerosis Mother        hx  . Emphysema Other        runs in the family     ROS:  Please see the history of present illness.   All other ROS reviewed and negative.     Physical Exam/Data:   Vitals:   11/13/20 0915 11/13/20 1115 11/13/20 1211 11/13/20 1400  BP: 98/74 124/70 (!) 113/52   Pulse: (!) 58 92 (!) 107  Resp: (!) 24 16 17    Temp:      TempSrc:      SpO2: 99% 100% 99% 99%  Weight:         Intake/Output Summary (Last 24 hours) at 11/13/2020 1455 Last data filed at 11/13/2020 1233 Gross per 24 hour  Intake 100 ml  Output -  Net 100 ml   Last 3 Weights 11/13/2020 10/03/2020 09/25/2020  Weight (lbs) 267 lb 267 lb 3.2 oz 266 lb 6.4 oz  Weight (kg) 121.11 kg 121.201 kg 120.838 kg     Body mass index is 47.3 kg/m.   EKG:  The EKG was personally reviewed and demonstrates:  11/13/20 Atrial flutter, HR 117bpm  Telemetry:  Telemetry was personally reviewed and demonstrates: 11/13/20 AF with HR in the 90-100s  Relevant CV Studies:  Echo 03/03/2018:  Study Conclusions   - Left ventricle: The cavity size was normal. There was moderate  concentric hypertrophy. Systolic function was severely reduced.  The estimated ejection fraction was in the range of 15% to 20%.  Diffuse hypokinesis.  - Aortic valve: There was no regurgitation.  - Aortic root: The aortic root was normal in size.  - Mitral valve: There was mild regurgitation.  - Left atrium: The atrium was moderately dilated.  - Right ventricle: Systolic function was normal.  - Right atrium: The atrium was mildly dilated.  - Tricuspid valve: There was mild regurgitation.  - Pulmonary arteries: Systolic pressure was within the normal  range.  - Inferior vena cava: The vessel was normal in size.  - Pericardium, extracardiac: There was no pericardial effusion.   Impressions:   - Images with echocontrast show no thrombus in the left ventricle  but there is significant smoke.   Echo 05/28/20   1. Left ventricular ejection fraction, by estimation, is 45 to 50%. The  left ventricle has mildly decreased function. The left ventricle  demonstrates global hypokinesis. There is moderate concentric left  ventricular hypertrophy. Left ventricular  diastolic parameters are indeterminate due to underlying atrial  fibrillation.  2. Right ventricular systolic function is mildly reduced. The right  ventricular size is  normal. Tricuspid regurgitation signal is inadequate  for assessing PA pressure.  3. Left atrial size was mildly dilated.  4. The mitral valve is normal in structure. Trivial mitral valve  regurgitation. No evidence of mitral stenosis.  5. The aortic valve is tricuspid. Aortic valve regurgitation is not  visualized. Mild aortic valve sclerosis is present, with no evidence of  aortic valve stenosis.  6. The inferior vena cava is normal in size with greater than 50%  respiratory variability, suggesting right atrial pressure of 3 mmHg.   Laboratory Data:  Chemistry Recent Labs  Lab 11/12/20 1727 11/13/20 0813 11/13/20 0840  NA 129* 130* 131*  K 4.8 3.9 3.8  CL 95*  --  98  CO2 20*  --  18*  GLUCOSE 572*  --  304*  BUN 35*  --  39*  CREATININE 1.84*  --  1.93*  CALCIUM 9.7  --  9.9  GFRNONAA 30*  --  28*  ANIONGAP 14  --  15    No results for input(s): PROT, ALBUMIN, AST, ALT, ALKPHOS, BILITOT in the last 168 hours. Hematology Recent Labs  Lab 11/12/20 1727 11/13/20 0813  WBC 5.5  --   RBC 3.93  --   HGB 11.8* 11.2*  HCT 36.8 33.0*  MCV 93.6  --   MCH 30.0  --   MCHC  32.1  --   RDW 13.2  --   PLT 250  --    Cardiac EnzymesNo results for input(s): TROPONINI in the last 168 hours. No results for input(s): TROPIPOC in the last 168 hours.  BNP Recent Labs  Lab 11/13/20 0842  BNP 56.2    DDimer  Recent Labs  Lab 11/13/20 0840  DDIMER 0.27    Radiology/Studies:  DG Chest 2 View  Result Date: 11/12/2020 CLINICAL DATA:  Shortness of breath for several days. Sarcoidosis. Atrial fibrillation. EXAM: CHEST - 2 VIEW COMPARISON:  03/02/2018 FINDINGS: The heart size and mediastinal contours are within normal limits. Both lungs are clear. The visualized skeletal structures are unremarkable. IMPRESSION: No active cardiopulmonary disease. Electronically Signed   By: Marlaine Hind M.D.   On: 11/12/2020 18:22   DG Cervical Spine 1 View  Result Date: 11/13/2020 CLINICAL  DATA:  70 year old female with shortness of breath. Query abnormal epiglottis. EXAM: DG CERVICAL SPINE - 1 VIEW COMPARISON:  Neck radiographs 05/16/2014. FINDINGS: The epiglottis contour is normal, stable since 2015. The hypopharynx is distended with gas today. Negative visible trachea. Other pharyngeal soft tissue contours appear normal, with no retropharyngeal or prevertebral thickening. Advanced cervical spine degeneration. IMPRESSION: 1. Epiglottis is normal. 2. Hypopharynx distended with gas, which can be seen with laryngotracheobronchitis (croup). Electronically Signed   By: Genevie Ann M.D.   On: 11/13/2020 06:26   Assessment and Plan:   1. Persistent AF with RVR: -Pt has a known hx of AF followed with Dr. [name] and AF clinic. She has undergone multiple cardioversions with ultimate conversion back to AF. Most recently she was see in f/u 09/2020 and found to be in rate controlled AF however plan was for repeat DCCV. She has been referred for possible AF ablation however defered in the past. She has been tried on dofetilide however this resulted in QT prolongation and was stopped. She had been previously treated with Amiodarone but developed tremors and hyperthyroidism. She has more recently been treated with Multaq, carvedilol and Pradaxa.  -RVR likely in the setting of COVID 19 infection and DKA -Would recommend to continue IV diltiazem for rate control and restart PTA medications including carvedilol once BP tolerates. There appears to be no drug-drug interaction with her current medications.  -Repeat echocardiogram this admission  -Continue AC with PTA pradaxa versus IV Heparin   2. Hypoxic respiratory failure secondary to COVID-19: -Pt presented with a week long hx of fatigue, SOB and loss of appetite found or be COVID positive on admission.  -CXR without acute findings -Primary team managing with IV remdesivir  -Following infalmmatory markers>>procalcitonin at 0.34 today  -Reports full COVID  vaccination  3. Hx of NICM (presumed): -Echocardiogram from 03/03/2018 with EF at 15-20% with diffuse hypokinesis with normalization of EF on repeat echo 12/20219 to 55-60%. Unfortunately, most recent echo 05/2020 with mild decrease in function to 45-50% -I personally cannot find evidence of an ischemic workup in the past with cCTA, stress testing or LHC.  -Due to COVID and hx of NICM with very poor EF, would recommend repeat echo this admission.  -On PTA carvedilol, Entresto 24-26, spironolactone  -May need to hold [facility] for now given elevated Cr at 1.93 -Baseline appears to be in the 1.2 range   4. Acute on chronic kidney disease stage III: -Creatinine elevated above baseline at 1.93 today -Appears baseline is in the 1.2 range -Currently being treated with IVF for DKA  -Follow BMET trend -Holding Entresto, Lasix and  spiro until renal function back to baseline   5. HHS secondary to DM: -BS on hospital presentation found to be 572 with Bicarb at 20 -Treated with IV insulin per IM  -HbA1c, 10.3   For questions or updates, please contact [place] Please consult [email or web address] for contact info under     Signed, Kathyrn Drown, NP  11/13/2020 2:55 PM

## 2020-11-13 NOTE — ED Notes (Signed)
Pt said she wants to go home. Pt said she feels more sick and does not want to be here with all the alarms. Told the RN Domingo Mend.

## 2020-11-14 ENCOUNTER — Inpatient Hospital Stay (HOSPITAL_COMMUNITY): Payer: Medicare Other

## 2020-11-14 DIAGNOSIS — I4891 Unspecified atrial fibrillation: Secondary | ICD-10-CM

## 2020-11-14 DIAGNOSIS — U071 COVID-19: Secondary | ICD-10-CM | POA: Diagnosis not present

## 2020-11-14 DIAGNOSIS — J069 Acute upper respiratory infection, unspecified: Secondary | ICD-10-CM | POA: Diagnosis not present

## 2020-11-14 LAB — CBC WITH DIFFERENTIAL/PLATELET
Abs Immature Granulocytes: 0.01 10*3/uL (ref 0.00–0.07)
Basophils Absolute: 0 10*3/uL (ref 0.0–0.1)
Basophils Relative: 1 %
Eosinophils Absolute: 0.3 10*3/uL (ref 0.0–0.5)
Eosinophils Relative: 6 %
HCT: 32.8 % — ABNORMAL LOW (ref 36.0–46.0)
Hemoglobin: 10.2 g/dL — ABNORMAL LOW (ref 12.0–15.0)
Immature Granulocytes: 0 %
Lymphocytes Relative: 47 %
Lymphs Abs: 2.4 10*3/uL (ref 0.7–4.0)
MCH: 29.7 pg (ref 26.0–34.0)
MCHC: 31.1 g/dL (ref 30.0–36.0)
MCV: 95.3 fL (ref 80.0–100.0)
Monocytes Absolute: 0.5 10*3/uL (ref 0.1–1.0)
Monocytes Relative: 11 %
Neutro Abs: 1.7 10*3/uL (ref 1.7–7.7)
Neutrophils Relative %: 35 %
Platelets: 198 10*3/uL (ref 150–400)
RBC: 3.44 MIL/uL — ABNORMAL LOW (ref 3.87–5.11)
RDW: 13.4 % (ref 11.5–15.5)
WBC: 5 10*3/uL (ref 4.0–10.5)
nRBC: 0 % (ref 0.0–0.2)

## 2020-11-14 LAB — GLUCOSE, CAPILLARY
Glucose-Capillary: 257 mg/dL — ABNORMAL HIGH (ref 70–99)
Glucose-Capillary: 209 mg/dL — ABNORMAL HIGH (ref 70–99)
Glucose-Capillary: 319 mg/dL — ABNORMAL HIGH (ref 70–99)

## 2020-11-14 LAB — ECHOCARDIOGRAM LIMITED
Area-P 1/2: 3.85 cm2
S' Lateral: 2.7 cm
Weight: 4272 oz

## 2020-11-14 LAB — BASIC METABOLIC PANEL
Anion gap: 14 (ref 5–15)
BUN: 35 mg/dL — ABNORMAL HIGH (ref 8–23)
CO2: 20 mmol/L — ABNORMAL LOW (ref 22–32)
Calcium: 9.7 mg/dL (ref 8.9–10.3)
Chloride: 95 mmol/L — ABNORMAL LOW (ref 98–111)
Creatinine, Ser: 1.84 mg/dL — ABNORMAL HIGH (ref 0.44–1.00)
GFR, Estimated: 30 mL/min — ABNORMAL LOW (ref >60)
Glucose, Bld: 572 mg/dL (ref 70–99)
Potassium: 4.8 mmol/L (ref 3.5–5.1)
Sodium: 129 mmol/L — ABNORMAL LOW (ref 135–145)

## 2020-11-14 LAB — COMPREHENSIVE METABOLIC PANEL
ALT: 45 U/L — ABNORMAL HIGH (ref 0–44)
AST: 37 U/L (ref 15–41)
Albumin: 3.1 g/dL — ABNORMAL LOW (ref 3.5–5.0)
Alkaline Phosphatase: 138 U/L — ABNORMAL HIGH (ref 38–126)
Anion gap: 11 (ref 5–15)
BUN: 30 mg/dL — ABNORMAL HIGH (ref 8–23)
CO2: 21 mmol/L — ABNORMAL LOW (ref 22–32)
Calcium: 9.2 mg/dL (ref 8.9–10.3)
Chloride: 98 mmol/L (ref 98–111)
Creatinine, Ser: 1.87 mg/dL — ABNORMAL HIGH (ref 0.44–1.00)
GFR, Estimated: 29 mL/min — ABNORMAL LOW (ref >60)
Glucose, Bld: 450 mg/dL — ABNORMAL HIGH (ref 70–99)
Potassium: 4.9 mmol/L (ref 3.5–5.1)
Sodium: 130 mmol/L — ABNORMAL LOW (ref 135–145)
Total Bilirubin: 0.8 mg/dL (ref 0.3–1.2)
Total Protein: 7 g/dL (ref 6.5–8.1)

## 2020-11-14 LAB — D-DIMER, QUANTITATIVE: D-Dimer, Quant: 0.27 ug/mL-FEU (ref 0.00–0.50)

## 2020-11-14 LAB — CBG MONITORING, ED: Glucose-Capillary: 402 mg/dL — ABNORMAL HIGH (ref 70–99)

## 2020-11-14 LAB — C-REACTIVE PROTEIN: CRP: 0.7 mg/dL (ref <1.0)

## 2020-11-14 MED ORDER — ALUM & MAG HYDROXIDE-SIMETH 200-200-20 MG/5ML PO SUSP
30.0000 mL | Freq: Four times a day (QID) | ORAL | Status: DC | PRN
  Filled 2020-11-14: qty 30

## 2020-11-14 MED ORDER — ZOLPIDEM TARTRATE 5 MG PO TABS
5.0000 mg | ORAL_TABLET | Freq: Every evening | ORAL | Status: DC | PRN
  Administered 2020-11-14 – 2020-11-15 (×2): 5 mg via ORAL
  Filled 2020-11-14 (×2): qty 1

## 2020-11-14 MED ORDER — METOPROLOL TARTRATE 25 MG PO TABS
25.0000 mg | ORAL_TABLET | Freq: Two times a day (BID) | ORAL | Status: DC
  Administered 2020-11-14 – 2020-11-16 (×4): 25 mg via ORAL
  Filled 2020-11-14 (×5): qty 1

## 2020-11-14 NOTE — Progress Notes (Signed)
PROGRESS NOTE    Stephanie [name]  VOZ:[id number] DOB: [birth year] DOA: 11/12/2020 PCP: Lilian Coma., MD    Brief Narrative:  70 year old female with history of CAD, diabetes mellitus, IDDM, chronic diastolic CHF, persistent atrial fibrillation status post DCCV 07/2009 on Pradaxa, history of sarcoidosis presented to ED with increasing shortness of breath with exertion.  Patient reported that she started having symptoms after she went on a jury duty on January 18th and may have been exposed there as many people were congested and coughing.  She felt chills, fevers, loss of appetite myalgias and fatigue.  She started having shortness of breath, which has been progressively worsening.  Due to nausea and loss of appetite, she has not been taking her medications also.  Patient states she barely ate in the last 2 days. Patient was found to be in rapid atrial fibrillation with RVR, heart rate in 120s, was placed on Cardizem drip.  BP was lowest in 70s however improved to 113/89 at the time of my encounter.   Patient was also found to be in DKA with CBGs in 500s, was placed on IV insulin drip and IV fluid hydration. Pt was noted to be covid pos  Assessment & Plan:   Principal Problem:   Acute respiratory disease due to COVID-19 virus Active Problems:   Essential hypertension   Chronic diastolic heart failure (HCC)   Obesity   Atrial fibrillation with rapid ventricular response (HCC)   NICM (nonischemic cardiomyopathy) ([place])   AKI (acute kidney injury) ([place])   Hyperosmolar hyperglycemic state (HHS) ([place])  Principal Problem:   Acute respiratory disease due to COVID-19 virus -COVID-19 test positive, symptomatic, with shortness of breath, myalgias, loss of appetite, dehydration.  Chest x-ray showed lungs are clear -will place on IV remdesivir per pharmacy protocol. -Currently on room air -CRP is currently 0.7 -Placed on antitussives, vitamin C, zinc, incentive spirometry  Active Problems:    Atrial fibrillation with rapid ventricular response ([place]) -Likely precipitated due to #1 and not taking her medications (nausea, loss of appetite) -Patient was placed on IV Cardizem drip, will continue for goal heart rate less than 100 -Initially resumed Multaq at time of presentation  -Continue Pradaxa,  -Cardiology consulted. Recs to continue IV cardizem gtt. Multaq now on hold, hold entresto and spironolactone as well as coreg given soft bp per Cardiology recs.    Essential hypertension -BP presented borderline soft, cont to hold off on Lasix, Entresto, Coreg, Aldactone -BP stable and controlled at this time    Chronic combined systolic and diastolic heart failure (HCC), NICM -Compensated, currently appears somewhat dehydrated, hold off on Lasix, Entresto, spironolactone -2D echo 05/2020 had shown EF of 45 to 50% with global hypokinesis, indeterminate diastolic parameters -repeat 2d echo per Cardiology    AKI (acute kidney injury) ([place]) on CKD stage IIIa -Baseline creatinine 1.1-1.2 -Presented with creatinine of 1.8, likely due to #1, dehydration, medications -Hold off on Lasix and spironolactone, receiving IVF -recheck bmet in AM    Hyperosmolar hyperglycemic state (HHS) ([place]) in the setting of uncontrolled diabetes mellitus -Patient had not been taking her medications in the last few days, CBG 572 on arrival, bicarb 20, anion gap 14 -Initially started on IV insulin drip, IV fluid hydration -Hemoglobin A1c 10.3, diabetic coordinator consulted -Pt now transitioned to subq insulin, lantus 24 units with SSI coverage  DVT prophylaxis: Pradaxa Code Status: Full Family Communication: Pt in room, family not at bedside  Status is: Inpatient  Remains inpatient appropriate because:IV treatments  appropriate due to intensity of illness or inability to take PO and Inpatient level of care appropriate due to severity of illness   Dispo: The patient is from: Home               Anticipated d/c is to: Home              Anticipated d/c date is: 3 days              Patient currently is not medically stable to d/c.   Difficult to place patient No       Consultants:   Cardiology  Procedures:     Antimicrobials: Anti-infectives (From admission, onward)   Start     Dose/Rate Route Frequency Ordered Stop   11/14/20 1000  remdesivir 100 mg in sodium chloride 0.9 % 100 mL IVPB  Status:  Discontinued       "Followed by" Linked Group Details   100 mg 200 mL/hr over 30 Minutes Intravenous Daily 11/13/20 0903 11/13/20 0915   11/14/20 1000  remdesivir 100 mg in sodium chloride 0.9 % 100 mL IVPB       "Followed by" Linked Group Details   100 mg 200 mL/hr over 30 Minutes Intravenous Daily 11/13/20 0849 11/18/20 0959   11/13/20 1000  remdesivir 200 mg in sodium chloride 0.9% 250 mL IVPB       "Followed by" Linked Group Details   200 mg 580 mL/hr over 30 Minutes Intravenous Once 11/13/20 0849 11/13/20 2108   11/13/20 0845  remdesivir 200 mg in sodium chloride 0.9% 250 mL IVPB  Status:  Discontinued       "Followed by" Linked Group Details   200 mg 580 mL/hr over 30 Minutes Intravenous Once 11/13/20 0903 11/13/20 0915   11/13/20 0415  cefTRIAXone (ROCEPHIN) 1 g in sodium chloride 0.9 % 100 mL IVPB  Status:  Discontinued        1 g 200 mL/hr over 30 Minutes Intravenous  Once 11/13/20 0407 11/13/20 0408       Subjective: Without complaints this AM  Objective: Vitals:   11/14/20 0630 11/14/20 0930 11/14/20 1030 11/14/20 1158  BP:   116/77 121/77  Pulse: 75  87 80  Resp: 12  18 15   Temp:    98.5 F (36.9 C)  TempSrc:      SpO2: 96% 93% 98%   Weight:    114.9 kg  Height:    5\' 3"  (1.6 m)    Intake/Output Summary (Last 24 hours) at 11/14/2020 1441 Last data filed at 11/13/2020 1523 Gross per 24 hour  Intake 100 ml  Output --  Net 100 ml   Filed Weights   11/13/20 0900 11/14/20 1158  Weight: 121.1 kg 114.9 kg    Examination:  General exam:  Appears calm and comfortable  Respiratory system: no audible wheezing. Respiratory effort normal. Cardiovascular system: perfused, regular Gastrointestinal system: Abdomen is nondistended, nontender. Central nervous system: Alert and oriented. No focal neurological deficits. Extremities: Symmetric 5 x 5 power. Skin: No rashes, lesions  Psychiatry: Judgement and insight appear normal. Mood & affect appropriate.   Data Reviewed: I have personally reviewed following labs and imaging studies  CBC: Recent Labs  Lab 11/12/20 1727 11/13/20 0813 11/14/20 0452  WBC 5.5  --  5.0  NEUTROABS  --   --  1.7  HGB 11.8* 11.2* 10.2*  HCT 36.8 33.0* 32.8*  MCV 93.6  --  95.3  PLT 250  --  198  Basic Metabolic Panel: Recent Labs  Lab 11/12/20 1727 11/13/20 0813 11/13/20 0840 11/13/20 1520 11/14/20 0452  NA 129* 130* 131* 131* 130*  K 4.8 3.9 3.8 4.4 4.9  CL 95*  --  98 98 98  CO2 20*  --  18* 22 21*  GLUCOSE 572*  --  304* 236* 450*  BUN 35*  --  39* 34* 30*  CREATININE 1.84*  --  1.93* 1.55* 1.87*  CALCIUM 9.7  --  9.9 9.5 9.2   GFR: Estimated Creatinine Clearance: 35.2 mL/min (A) (by C-G formula based on SCr of 1.87 mg/dL (H)). Liver Function Tests: Recent Labs  Lab 11/14/20 0452  AST 37  ALT 45*  ALKPHOS 138*  BILITOT 0.8  PROT 7.0  ALBUMIN 3.1*   No results for input(s): LIPASE, AMYLASE in the last 168 hours. No results for input(s): AMMONIA in the last 168 hours. Coagulation Profile: No results for input(s): INR, PROTIME in the last 168 hours. Cardiac Enzymes: No results for input(s): CKTOTAL, CKMB, CKMBINDEX, TROPONINI in the last 168 hours. BNP (last 3 results) No results for input(s): PROBNP in the last 8760 hours. HbA1C: Recent Labs    11/13/20 0904  HGBA1C 10.3*   CBG: Recent Labs  Lab 11/13/20 1657 11/13/20 1920 11/13/20 2300 11/14/20 0815 11/14/20 1309  GLUCAP 244* 167* 243* 402* 257*   Lipid Profile: No results for input(s): CHOL, HDL,  LDLCALC, TRIG, CHOLHDL, LDLDIRECT in the last 72 hours. Thyroid Function Tests: No results for input(s): TSH, T4TOTAL, FREET4, T3FREE, THYROIDAB in the last 72 hours. Anemia Panel: Recent Labs    11/13/20 0840  FERRITIN 453*   Sepsis Labs: Recent Labs  Lab 11/13/20 0840  PROCALCITON 0.34    Recent Results (from the past 240 hour(s))  SARS Coronavirus 2 by RT PCR (hospital order, performed in [facility] hospital lab) Nasopharyngeal Nasopharyngeal Swab     Status: Abnormal   Collection Time: 11/13/20  4:33 AM   Specimen: Nasopharyngeal Swab  Result Value Ref Range Status   SARS Coronavirus 2 POSITIVE (A) NEGATIVE Final    Comment: RESULT CALLED TO, READ BACK BY AND VERIFIED WITH: JASMINE J RN @0546  11/13/20 EB (NOTE) SARS-CoV-2 target nucleic acids are DETECTED  SARS-CoV-2 RNA is generally detectable in upper respiratory specimens  during the acute phase of infection.  Positive results are indicative  of the presence of the identified virus, but do not rule out bacterial infection or co-infection with other pathogens not detected by the test.  Clinical correlation with patient history and  other diagnostic information is necessary to determine patient infection status.  The expected result is negative.  Fact Sheet for Patients:   StrictlyIdeas.no   Fact Sheet for Healthcare Providers:   BankingDealers.co.za    This test is not yet approved or cleared by the [place] FDA and  has been authorized for detection and/or diagnosis of SARS-CoV-2 by FDA under an Emergency Use Authorization (EUA).  This EUA will remain in effect (meaning this test ca n be used) for the duration of  the COVID-19 declaration under Section 564(b)(1) of the Act, 21 U.S.C. section 360-bbb-3(b)(1), unless the authorization is terminated or revoked sooner.  Performed at [facility], [place] [address] [place], [place] [address]      Radiology  Studies: DG Chest 2 View  Result Date: 11/12/2020 CLINICAL DATA:  Shortness of breath for several days. Sarcoidosis. Atrial fibrillation. EXAM: CHEST - 2 VIEW COMPARISON:  03/02/2018 FINDINGS: The heart size and  mediastinal contours are within normal limits. Both lungs are clear. The visualized skeletal structures are unremarkable. IMPRESSION: No active cardiopulmonary disease. Electronically Signed   By: Marlaine Hind M.D.   On: 11/12/2020 18:22   DG Cervical Spine 1 View  Result Date: 11/13/2020 CLINICAL DATA:  70 year old female with shortness of breath. Query abnormal epiglottis. EXAM: DG CERVICAL SPINE - 1 VIEW COMPARISON:  Neck radiographs 05/16/2014. FINDINGS: The epiglottis contour is normal, stable since 2015. The hypopharynx is distended with gas today. Negative visible trachea. Other pharyngeal soft tissue contours appear normal, with no retropharyngeal or prevertebral thickening. Advanced cervical spine degeneration. IMPRESSION: 1. Epiglottis is normal. 2. Hypopharynx distended with gas, which can be seen with laryngotracheobronchitis (croup). Electronically Signed   By: Genevie Ann M.D.   On: 11/13/2020 06:26    Scheduled Meds: . vitamin C  500 mg Oral Daily  . dabigatran  150 mg Oral BID  . docusate sodium  100 mg Oral BID  . insulin aspart  0-15 Units Subcutaneous TID WC  . insulin aspart  0-5 Units Subcutaneous QHS  . insulin glargine  24 Units Subcutaneous Daily  . zinc sulfate  220 mg Oral Daily   Continuous Infusions: . dextrose 5% lactated ringers Stopped (11/13/20 2046)  . diltiazem (CARDIZEM) infusion 15 mg/hr (11/14/20 0304)  . insulin Stopped (11/13/20 2045)  . lactated ringers    . lactated ringers Stopped (11/13/20 1952)  . remdesivir 100 mg in NS 100 mL Stopped (11/14/20 1049)     LOS: 1 day   Marylu Lund, MD [facility] Pager On Amion  If 7PM-7AM, please contact night-coverage 11/14/2020, 2:41 PM

## 2020-11-14 NOTE — Progress Notes (Signed)
  Echocardiogram 2D Echocardiogram has been performed.  Matilde Bash 11/14/2020, 11:52 AM

## 2020-11-14 NOTE — Progress Notes (Signed)
Due to the COVID-19 pandemic, this visit was completed with telemedicine (audio/video) technology to reduce patient and provider exposure as well as to preserve personal protective equipment.   Progress Note  Patient Name: Stephanie [name] Date of Encounter: 11/14/2020  [facility] Cardiologist: No primary care provider on file.   Subjective   Denies any chest pain. She has some indigestion but no SOB.  Heart rate much improved today on Cardizem gtt.  She is feeling better.   Inpatient Medications    Scheduled Meds: . vitamin C  500 mg Oral Daily  . dabigatran  150 mg Oral BID  . docusate sodium  100 mg Oral BID  . insulin aspart  0-15 Units Subcutaneous TID WC  . insulin aspart  0-5 Units Subcutaneous QHS  . insulin glargine  24 Units Subcutaneous Daily  . zinc sulfate  220 mg Oral Daily   Continuous Infusions: . dextrose 5% lactated ringers Stopped (11/13/20 2046)  . diltiazem (CARDIZEM) infusion 15 mg/hr (11/14/20 0304)  . insulin Stopped (11/13/20 2045)  . lactated ringers    . lactated ringers Stopped (11/13/20 1952)  . remdesivir 100 mg in NS 100 mL 100 mg (11/14/20 1019)   PRN Meds: acetaminophen, alum & mag hydroxide-simeth, chlorpheniramine-HYDROcodone, dextrose, ondansetron **OR** ondansetron (ZOFRAN) IV, polyethylene glycol, zolpidem   Vital Signs    Vitals:   11/14/20 0600 11/14/20 0615 11/14/20 0630 11/14/20 0930  BP:  (!) 109/58    Pulse: 84 65 75   Resp: 16 14 12    Temp:      TempSrc:      SpO2: 98% 95% 96% 93%  Weight:        Intake/Output Summary (Last 24 hours) at 11/14/2020 1059 Last data filed at 11/13/2020 1523 Gross per 24 hour  Intake 200 ml  Output --  Net 200 ml   Last 3 Weights 11/13/2020 10/03/2020 09/25/2020  Weight (lbs) 267 lb 267 lb 3.2 oz 266 lb 6.4 oz  Weight (kg) 121.11 kg 121.201 kg 120.838 kg      Telemetry    Atrial fibrillation with CVR in th e80's - Personally Reviewed  ECG    No new EKG to review - Personally  Reviewed  Physical Exam   PE not performed due to COVID 10 infection   Labs    High Sensitivity Troponin:   Recent Labs  Lab 11/13/20 0352 11/13/20 0638  TROPONINIHS 14 15      Chemistry Recent Labs  Lab 11/13/20 0840 11/13/20 1520 11/14/20 0452  NA 131* 131* 130*  K 3.8 4.4 4.9  CL 98 98 98  CO2 18* 22 21*  GLUCOSE 304* 236* 450*  BUN 39* 34* 30*  CREATININE 1.93* 1.55* 1.87*  CALCIUM 9.9 9.5 9.2  PROT  --   --  7.0  ALBUMIN  --   --  3.1*  AST  --   --  37  ALT  --   --  45*  ALKPHOS  --   --  138*  BILITOT  --   --  0.8  GFRNONAA 28* 36* 29*  ANIONGAP 15 11 11      Hematology Recent Labs  Lab 11/12/20 1727 11/13/20 0813 11/14/20 0452  WBC 5.5  --  5.0  RBC 3.93  --  3.44*  HGB 11.8* 11.2* 10.2*  HCT 36.8 33.0* 32.8*  MCV 93.6  --  95.3  MCH 30.0  --  29.7  MCHC 32.1  --  31.1  RDW 13.2  --  13.4  PLT 250  --  198    BNP Recent Labs  Lab 11/13/20 0842  BNP 56.2     DDimer  Recent Labs  Lab 11/13/20 0840 11/14/20 0452  DDIMER 0.27 0.27     CHA2DS2-VASc Score = 4  This indicates a 4.8% annual risk of stroke. The patient's score is based upon: CHF History: Yes HTN History: Yes Diabetes History: No Stroke History: No Vascular Disease History: No Age Score: 1 Gender Score: 1    Radiology    DG Chest 2 View  Result Date: 11/12/2020 CLINICAL DATA:  Shortness of breath for several days. Sarcoidosis. Atrial fibrillation. EXAM: CHEST - 2 VIEW COMPARISON:  03/02/2018 FINDINGS: The heart size and mediastinal contours are within normal limits. Both lungs are clear. The visualized skeletal structures are unremarkable. IMPRESSION: No active cardiopulmonary disease. Electronically Signed   By: Marlaine Hind M.D.   On: 11/12/2020 18:22   DG Cervical Spine 1 View  Result Date: 11/13/2020 CLINICAL DATA:  70 year old female with shortness of breath. Query abnormal epiglottis. EXAM: DG CERVICAL SPINE - 1 VIEW COMPARISON:  Neck radiographs  05/16/2014. FINDINGS: The epiglottis contour is normal, stable since 2015. The hypopharynx is distended with gas today. Negative visible trachea. Other pharyngeal soft tissue contours appear normal, with no retropharyngeal or prevertebral thickening. Advanced cervical spine degeneration. IMPRESSION: 1. Epiglottis is normal. 2. Hypopharynx distended with gas, which can be seen with laryngotracheobronchitis (croup). Electronically Signed   By: Genevie Ann M.D.   On: 11/13/2020 06:26    Cardiac Studies   2D echo 05/2020 IMPRESSIONS  1. Left ventricular ejection fraction, by estimation, is 45 to 50%. The  left ventricle has mildly decreased function. The left ventricle  demonstrates global hypokinesis. There is moderate concentric left  ventricular hypertrophy. Left ventricular  diastolic parameters are indeterminate due to underlying atrial  fibrillation.  2. Right ventricular systolic function is mildly reduced. The right  ventricular size is normal. Tricuspid regurgitation signal is inadequate  for assessing PA pressure.  3. Left atrial size was mildly dilated.  4. The mitral valve is normal in structure. Trivial mitral valve  regurgitation. No evidence of mitral stenosis.  5. The aortic valve is tricuspid. Aortic valve regurgitation is not  visualized. Mild aortic valve sclerosis is present, with no evidence of  aortic valve stenosis.  6. The inferior vena cava is normal in size with greater than 50%  respiratory variability, suggesting right atrial pressure of 3 mmHg.   FINDINGS   Patient Profile     70 y.o. female with a hx of HTN, persistent atrial fibrillation with tachy mediated DCM with most recent EF 45-50% in Aug 2021, HLD, sarcoidosis, DM2 who developed SOB, loss of appetite a few days ago and presented to the ER with palpitations.  Apparently had jury duty a week ago and became sick after that.  Found to be COVID + in ER and in afib with RVR.  Started on IV Cardizem gtt for HR  control.  Also in DKA with BS 500's.  Cardiology is now asked to consult.    Assessment & Plan    1. Persistent AF with RVR: -Pt has a known hx of AF followed with Dr. [name] and AF clinic. She has undergone multiple cardioversions with ultimate conversion back to AF. Most recently she was see in f/u 09/2020 and found to be in rate controlled AF however plan was for repeat DCCV. She has been referred for possible AF ablation however  defered in the past. She has been tried on dofetilide however this resulted in QT prolongation and was stopped. She had been previously treated with Amiodarone but developed tremors and hyperthyroidism. She has more recently been treated with Multaq, carvedilol and Pradaxa.  -RVR likely in the setting of COVID 19 infection and DKA -HR much improved on IV Cardizem -given hx of LV dysfunction will stop Cardizem IV and start Lopresor 25mg  BID (had been on Carvedilol 25mg  BID but BP will likely not tolerate carvedilol at this time) -Repeat echo pending today -Continue Pradaxa 150mg  BID  2. Hypoxic respiratory failure secondary to COVID-19: -Pt presented with a week long hx of fatigue, SOB and loss of appetite found or be COVID positive on admission.  -CXR without acute findings -Primary team managing with IV remdesivir  -Following infalmmatory markers>>procalcitonin at 0.34  -Reports full COVID vaccination  3. Hx of NICM (presumed): -Echocardiogram from 03/03/2018 with EF at 15-20% with diffuse hypokinesis with normalization of EF on repeat echo 12/20219 to 55-60%. Unfortunately, most recent echo 05/2020 with mild decrease in function to 45-50% -I personally cannot find evidence of an ischemic workup in the past with cCTA, stress testing or LHC.  -Due to COVID and hx of NICM with very poor EF, would recommend repeat echo this admission>>pending today -Entresto, lasix and spiro on hold due to AKI -Baseline appears to be in the 1.2 range   4. Acute on chronic kidney  disease stage III: -Creatinine elevated above baseline at 1.93>1.55>1.87 -Appears baseline is in the 1.2 range -Currently being treated with IVF for DKA  -Follow BMET trend -Holding Entresto, Lasix and spiro until renal function back to baseline   5. HHS secondary to DM: -BS on hospital presentation found to be 572 with Bicarb at 20 -Treated with IV insulin per IM  -HbA1c, 10.3     For questions or updates, please contact [place] Please consult [email or web address] for contact info under        Signed, Fransico Him, MD  11/14/2020, 10:59 AM

## 2020-11-14 NOTE — Progress Notes (Signed)
Inpatient Diabetes Program Recommendations  AACE/ADA: New Consensus Statement on Inpatient Glycemic Control (2015)  Target Ranges:  Prepandial:   less than 140 mg/dL      Peak postprandial:   less than 180 mg/dL (1-2 hours)      Critically ill patients:  140 - 180 mg/dL   Lab Results  Component Value Date   GLUCAP 402 (H) 11/14/2020   HGBA1C 10.3 (H) 11/13/2020    Review of Glycemic Control Results for Stephanie [name], Stephanie [name] (MRN [id number]) as of 11/14/2020 10:23  Ref. Range 11/13/2020 16:57 11/13/2020 19:20 11/13/2020 23:00 11/14/2020 08:15  Glucose-Capillary Latest Ref Range: 70 - 99 mg/dL 244 (H) 167 (H) 243 (H) 402 (H)   Diabetes history: DM 2 Outpatient Diabetes medications: Jardiance 10 mg Daily, Metformin 500 mg bid, Novolog 13 bid with meals Current orders for Inpatient glycemic control:  Lantus 24 units Novolog 0-15 units + hs scale  Inpatient Diabetes Program Recommendations:     Will need Lantus at time of d/c or basal insulin equivalent  -  Increase Lantus to 36 units (0.3 units/kg), give additional 12 units today  -  Add Novolog 5 units tid meal coverage    Thanks,  Tama Headings RN, MSN, BC-ADM Inpatient Diabetes Coordinator Team Pager [phone] (8a-5p)

## 2020-11-15 DIAGNOSIS — U071 COVID-19: Principal | ICD-10-CM

## 2020-11-15 DIAGNOSIS — J069 Acute upper respiratory infection, unspecified: Secondary | ICD-10-CM

## 2020-11-15 LAB — C-REACTIVE PROTEIN: CRP: 0.6 mg/dL (ref <1.0)

## 2020-11-15 LAB — CBC WITH DIFFERENTIAL/PLATELET
Abs Immature Granulocytes: 0.02 10*3/uL (ref 0.00–0.07)
Basophils Absolute: 0 10*3/uL (ref 0.0–0.1)
Basophils Relative: 1 %
Eosinophils Absolute: 0.2 10*3/uL (ref 0.0–0.5)
Eosinophils Relative: 4 %
HCT: 30.2 % — ABNORMAL LOW (ref 36.0–46.0)
Hemoglobin: 9.3 g/dL — ABNORMAL LOW (ref 12.0–15.0)
Immature Granulocytes: 0 %
Lymphocytes Relative: 47 %
Lymphs Abs: 2.4 10*3/uL (ref 0.7–4.0)
MCH: 29.4 pg (ref 26.0–34.0)
MCHC: 30.8 g/dL (ref 30.0–36.0)
MCV: 95.6 fL (ref 80.0–100.0)
Monocytes Absolute: 0.5 10*3/uL (ref 0.1–1.0)
Monocytes Relative: 9 %
Neutro Abs: 2 10*3/uL (ref 1.7–7.7)
Neutrophils Relative %: 39 %
Platelets: 163 10*3/uL (ref 150–400)
RBC: 3.16 MIL/uL — ABNORMAL LOW (ref 3.87–5.11)
RDW: 13.5 % (ref 11.5–15.5)
WBC: 5.2 10*3/uL (ref 4.0–10.5)
nRBC: 0 % (ref 0.0–0.2)

## 2020-11-15 LAB — COMPREHENSIVE METABOLIC PANEL
ALT: 46 U/L — ABNORMAL HIGH (ref 0–44)
AST: 38 U/L (ref 15–41)
Albumin: 2.9 g/dL — ABNORMAL LOW (ref 3.5–5.0)
Alkaline Phosphatase: 129 U/L — ABNORMAL HIGH (ref 38–126)
Anion gap: 8 (ref 5–15)
BUN: 21 mg/dL (ref 8–23)
CO2: 24 mmol/L (ref 22–32)
Calcium: 9.1 mg/dL (ref 8.9–10.3)
Chloride: 103 mmol/L (ref 98–111)
Creatinine, Ser: 1.41 mg/dL — ABNORMAL HIGH (ref 0.44–1.00)
GFR, Estimated: 41 mL/min — ABNORMAL LOW (ref >60)
Glucose, Bld: 275 mg/dL — ABNORMAL HIGH (ref 70–99)
Potassium: 4.6 mmol/L (ref 3.5–5.1)
Sodium: 135 mmol/L (ref 135–145)
Total Bilirubin: 0.8 mg/dL (ref 0.3–1.2)
Total Protein: 6.1 g/dL — ABNORMAL LOW (ref 6.5–8.1)

## 2020-11-15 LAB — GLUCOSE, CAPILLARY
Glucose-Capillary: 291 mg/dL — ABNORMAL HIGH (ref 70–99)
Glucose-Capillary: 299 mg/dL — ABNORMAL HIGH (ref 70–99)
Glucose-Capillary: 292 mg/dL — ABNORMAL HIGH (ref 70–99)
Glucose-Capillary: 157 mg/dL — ABNORMAL HIGH (ref 70–99)

## 2020-11-15 LAB — D-DIMER, QUANTITATIVE: D-Dimer, Quant: 0.28 ug/mL-FEU (ref 0.00–0.50)

## 2020-11-15 MED ORDER — INSULIN GLARGINE 100 UNIT/ML ~~LOC~~ SOLN
30.0000 [IU] | Freq: Every day | SUBCUTANEOUS | Status: DC
  Administered 2020-11-16: 30 [IU] via SUBCUTANEOUS
  Filled 2020-11-15: qty 0.3

## 2020-11-15 MED ORDER — INSULIN ASPART 100 UNIT/ML ~~LOC~~ SOLN
4.0000 [IU] | Freq: Three times a day (TID) | SUBCUTANEOUS | Status: DC
  Administered 2020-11-15 – 2020-11-16 (×4): 4 [IU] via SUBCUTANEOUS

## 2020-11-15 NOTE — Progress Notes (Signed)
Due to the COVID-19 pandemic, this visit was completed with telemedicine (audio/video) technology to reduce patient and provider exposure as well as to preserve personal protective equipment.   Progress Note  Patient Name: Stephanie [name] Date of Encounter: 11/15/2020  [facility] Cardiologist: No primary care provider on file.   Subjective   Pt denies CP    Inpatient Medications    Scheduled Meds:  vitamin C  500 mg Oral Daily   dabigatran  150 mg Oral BID   docusate sodium  100 mg Oral BID   insulin aspart  0-15 Units Subcutaneous TID WC   insulin aspart  0-5 Units Subcutaneous QHS   insulin glargine  24 Units Subcutaneous Daily   metoprolol tartrate  25 mg Oral BID   zinc sulfate  220 mg Oral Daily   Continuous Infusions:  dextrose 5% lactated ringers Stopped (11/13/20 2046)   insulin Stopped (11/13/20 2045)   lactated ringers     lactated ringers 125 mL/hr at 11/14/20 1716   remdesivir 100 mg in NS 100 mL Stopped (11/14/20 1049)   PRN Meds: acetaminophen, alum & mag hydroxide-simeth, chlorpheniramine-HYDROcodone, dextrose, ondansetron **OR** ondansetron (ZOFRAN) IV, polyethylene glycol, zolpidem   Vital Signs    Vitals:   11/14/20 1958 11/14/20 2353 11/15/20 0447 11/15/20 0809  BP: 100/78 (!) 117/57 139/61 (!) 147/60  Pulse: (!) 140 64 70 63  Resp: 18 18 16 13   Temp: 98.5 F (36.9 C) 98 F (36.7 C) 97.8 F (36.6 C) 98.9 F (37.2 C)  TempSrc: Oral Oral Oral Oral  SpO2: 97% 100% 98% 99%  Weight:      Height:        Intake/Output Summary (Last 24 hours) at 11/15/2020 0813 Last data filed at 11/14/2020 1711 Gross per 24 hour  Intake 480 ml  Output 425 ml  Net 55 ml   Last 3 Weights 11/14/2020 11/13/2020 10/03/2020  Weight (lbs) 253 lb 4.9 oz 267 lb 267 lb 3.2 oz  Weight (kg) 114.9 kg 121.11 kg 121.201 kg      Telemetry    Atrial fib  COnverte to SR 60 - Personally Reviewed  ECG    No new EKG to review - Personally  Reviewed  Physical Exam   PE not performed due to COVID 10 infection   Labs    High Sensitivity Troponin:   Recent Labs  Lab 11/13/20 0352 11/13/20 0638  TROPONINIHS 14 15      Chemistry Recent Labs  Lab 11/13/20 1520 11/14/20 0452 11/15/20 0503  NA 131* 130* 135  K 4.4 4.9 4.6  CL 98 98 103  CO2 22 21* 24  GLUCOSE 236* 450* 275*  BUN 34* 30* 21  CREATININE 1.55* 1.87* 1.41*  CALCIUM 9.5 9.2 9.1  PROT  --  7.0 6.1*  ALBUMIN  --  3.1* 2.9*  AST  --  37 38  ALT  --  45* 46*  ALKPHOS  --  138* 129*  BILITOT  --  0.8 0.8  GFRNONAA 36* 29* 41*  ANIONGAP 11 11 8      Hematology Recent Labs  Lab 11/12/20 1727 11/13/20 0813 11/14/20 0452 11/15/20 0503  WBC 5.5  --  5.0 5.2  RBC 3.93  --  3.44* 3.16*  HGB 11.8* 11.2* 10.2* 9.3*  HCT 36.8 33.0* 32.8* 30.2*  MCV 93.6  --  95.3 95.6  MCH 30.0  --  29.7 29.4  MCHC 32.1  --  31.1 30.8  RDW 13.2  --  13.4 13.5  PLT 250  --  198 163    BNP Recent Labs  Lab 11/13/20 0842  BNP 56.2     DDimer  Recent Labs  Lab 11/13/20 0840 11/14/20 0452 11/15/20 0503  DDIMER 0.27 0.27 0.28     CHA2DS2-VASc Score = 4  This indicates a 4.8% annual risk of stroke. The patient's score is based upon: CHF History: Yes HTN History: Yes Diabetes History: No Stroke History: No Vascular Disease History: No Age Score: 1 Gender Score: 1    Radiology     Cardiac Studies   2D echo 11/15/20 1. Left ventricular ejection fraction, by estimation, is 40 to 45%. The left ventricle has mildly decreased function. Left ventricular endocardial border not optimally defined to evaluate regional wall motion. There is moderate left ventricular hypertrophy. Left ventricular diastolic parameters are indeterminate. 2. Right ventricular systolic function was not well visualized. The right ventricular size is normal. Tricuspid regurgitation signal is inadequate for assessing PA pressure. 3. Left atrial size was mild to moderately  dilated. 4. The mitral valve is normal in structure. No evidence of mitral valve regurgitation. No evidence of mitral stenosis. 5. The aortic valve was not well visualized. Aortic valve regurgitation is not visualized. Mild aortic valve sclerosis is present, with no evidence of aortic valve stenosis. 6. The inferior vena cava is normal in size with greater than 50% respiratory variability, suggesting right atrial pressure of 3 mmHg. 7. Technically difficult study with poor acoustic windows.  Patient Profile     70 y.o. female with a hx of HTN, persistent atrial fibrillation with tachy mediated DCM with most recent EF 45-50% in Aug 2021, HLD, sarcoidosis, DM2 who developed SOB, loss of appetite a few days ago and presented to the ER with palpitations.  Apparently had jury duty a week ago and became sick after that.  Found to be COVID + in ER and in afib with RVR.  Started on IV Cardizem gtt for HR control.  Also in DKA with BS 500's.  Cardiology is now asked to consult.    Assessment & Plan    1. Persistent AF with RVR: -Pt has a known hx of AF followed with Dr. [name] and AF clinic. She has undergone multiple cardioversions with ultimate conversion back to AF. Most recently she was see in f/u 09/2020 and found to be in rate controlled AF however plan was for repeat DCCV. She has been referred for possible AF ablation however defered in the past. She has been tried on dofetilide however this resulted in QT prolongation and was stopped. She had been previously treated with Amiodarone but developed tremors and hyperthyroidism. She has more recently been treated with Multaq, carvedilol and Pradaxa.  -RVR likely in the setting of COVID 19 infection and DKA -Pt converted to SR    Would keep on current regimen of metoprolol 25 bid -Continue Pradaxa 150mg  BID  2. Hypoxic respiratory failure secondary to COVID-19: -Managed by IM  3. Hx of NICM (presumed): -Echocardiogram from 03/03/2018 with EF at  15-20% with diffuse hypokinesis with normalization of EF on repeat echo 12/20219 to 55-60%. Unfortunately, most recent echo 05/2020 with mild decrease in function to 45-50% -I personally cannot find evidence of an ischemic workup in the past with cCTA, stress testing or LHC.  Limited echo yesterday showed mild decreased from this   I would follow up when fullly recovers as outpt -Entresto, lasix and spiro on hold due to AKI -Baseline appears to be  in the 1.2 range   4. Acute on chronic kidney disease stage III: -Creatinine has come down to 1.41  5. HHS secondary to DM: ICM -HbA1c, 10.3    WIll make sure that pt has f/u in clinic to assess BP and volume status    WIll sign off  Please call for questions   For questions or updates, please contact [place] Please consult [email or web address] for contact info under      Signed, Dorris Carnes, MD  11/15/2020, 8:13 AM

## 2020-11-15 NOTE — Progress Notes (Signed)
Inpatient Diabetes Program Recommendations  AACE/ADA: New Consensus Statement on Inpatient Glycemic Control (2015)  Target Ranges:  Prepandial:   less than 140 mg/dL      Peak postprandial:   less than 180 mg/dL (1-2 hours)      Critically ill patients:  140 - 180 mg/dL   Lab Results  Component Value Date   GLUCAP 291 (H) 11/15/2020   HGBA1C 10.3 (H) 11/13/2020    Review of Glycemic Control Results for Stephanie [name], Stephanie [name] (MRN [id number]) as of 11/15/2020 09:38  Ref. Range 11/14/2020 08:15 11/14/2020 13:09 11/14/2020 16:26 11/14/2020 21:43 11/15/2020 08:07  Glucose-Capillary Latest Ref Range: 70 - 99 mg/dL 402 (H) 257 (H) 209 (H) 319 (H) 291 (H)    Diabetes history: DM 2 Outpatient Diabetes medications: Jardiance 10 mg Daily, Metformin 500 mg bid, Novolog 13 bid with meals Current orders for Inpatient glycemic control:  Lantus 24 units Novolog 0-15 units + hs scale  Inpatient Diabetes Program Recommendations:     Will need Lantus at time of d/c or basal insulin equivalent  -  Increase Lantus to 30 units. Give additional units this am  -  Add Novolog 4 units tid meal coverage    Thanks,  Tama Headings RN, MSN, BC-ADM Inpatient Diabetes Coordinator Team Pager [phone] (8a-5p)

## 2020-11-15 NOTE — Progress Notes (Signed)
PROGRESS NOTE    Stephanie [name]  [id number] DOB: [birth year] DOA: 11/12/2020 PCP: Lilian Coma., MD    Brief Narrative:  70 year old female with history of CAD, diabetes mellitus, IDDM, chronic diastolic CHF, persistent atrial fibrillation status post DCCV 07/2009 on Pradaxa, history of sarcoidosis presented to ED with increasing shortness of breath with exertion.  Patient reported that she started having symptoms after she went on a jury duty on January 18th and may have been exposed there as many people were congested and coughing.  She felt chills, fevers, loss of appetite myalgias and fatigue.  She started having shortness of breath, which has been progressively worsening.  Due to nausea and loss of appetite, she has not been taking her medications also.  Patient states she barely ate in the last 2 days. Patient was found to be in rapid atrial fibrillation with RVR, heart rate in 120s, was placed on Cardizem drip.  BP was lowest in 70s however improved to 113/89 at the time of my encounter.   Patient was also found to be in DKA with CBGs in 500s, was placed on IV insulin drip and IV fluid hydration. Pt was noted to be covid pos  Assessment & Plan:   Principal Problem:   Acute respiratory disease due to COVID-19 virus Active Problems:   Essential hypertension   Chronic diastolic heart failure (HCC)   Obesity   Atrial fibrillation with rapid ventricular response (HCC)   NICM (nonischemic cardiomyopathy) ([place])   AKI (acute kidney injury) ([place])   Hyperosmolar hyperglycemic state (HHS) ([place])  Principal Problem:   Acute respiratory disease due to COVID-19 virus -COVID-19 test positive, symptomatic, with shortness of breath, myalgias, loss of appetite, dehydration.  Chest x-ray showed lungs are clear -will place on IV remdesivir per pharmacy protocol. -Remains on room air -CRP is currently 0.6 -Placed on antitussives, vitamin C, zinc, incentive spirometry  Active Problems:    Atrial fibrillation with rapid ventricular response (HCC) -Likely precipitated due to #1 and not taking her medications (nausea, loss of appetite) -Patient was placed on IV Cardizem drip, will continue for goal heart rate less than 100 -Initially resumed Multaq at time of presentation  -Continue Pradaxa  -Cardiology consulted. Initially on IV cardizem gtt. Multaq now on hold, hold entresto and spironolactone as well as coreg given soft bp per Cardiology -Now on metoprolol 25mg  po bid    Essential hypertension -BP presented borderline soft, cont to hold off on Lasix, Entresto, Coreg, Aldactone -Now on metoprolol. BP stable    Chronic combined systolic and diastolic heart failure ([place]), NICM -Compensated, currently appears somewhat dehydrated, hold off on Lasix, Entresto, spironolactone -2D echo 05/2020 had shown EF of 45 to 50% with global hypokinesis, indeterminate diastolic parameters -Pt to follow up closely with Cardiology when discharged    AKI (acute kidney injury) ([place]) on CKD stage IIIa -Baseline creatinine 1.1-1.2 -Presented with creatinine of 1.8, likely due to #1, dehydration, medications -Hold off on Lasix and spironolactone, received IVF -recheck bmet in AM    Hyperosmolar hyperglycemic state (HHS) ([place]) in the setting of uncontrolled diabetes mellitus -Patient had not been taking her medications in the last few days, CBG 572 on arrival, bicarb 20, anion gap 14 -Initially started on IV insulin drip, IV fluid hydration -Hemoglobin A1c 10.3, diabetic coordinator consulted -Pt now transitioned to subq insulin, lantus 30 units with SSI coverage and 4 units meal coverage  DVT prophylaxis: Pradaxa Code Status: Full Family Communication: Pt in room, family not at  bedside  Status is: Inpatient  Remains inpatient appropriate because:IV treatments appropriate due to intensity of illness or inability to take PO and Inpatient level of care appropriate due to severity of  illness   Dispo: The patient is from: Home              Anticipated d/c is to: Home              Anticipated d/c date is: 1 day              Patient currently is not medically stable to d/c.   Difficult to place patient No       Consultants:   Cardiology  Procedures:     Antimicrobials: Anti-infectives (From admission, onward)   Start     Dose/Rate Route Frequency Ordered Stop   11/14/20 1000  remdesivir 100 mg in sodium chloride 0.9 % 100 mL IVPB  Status:  Discontinued       "Followed by" Linked Group Details   100 mg 200 mL/hr over 30 Minutes Intravenous Daily 11/13/20 0903 11/13/20 0915   11/14/20 1000  remdesivir 100 mg in sodium chloride 0.9 % 100 mL IVPB       "Followed by" Linked Group Details   100 mg 200 mL/hr over 30 Minutes Intravenous Daily 11/13/20 0849 11/18/20 0959   11/13/20 1000  remdesivir 200 mg in sodium chloride 0.9% 250 mL IVPB       "Followed by" Linked Group Details   200 mg 580 mL/hr over 30 Minutes Intravenous Once 11/13/20 0849 11/13/20 2108   11/13/20 0845  remdesivir 200 mg in sodium chloride 0.9% 250 mL IVPB  Status:  Discontinued       "Followed by" Linked Group Details   200 mg 580 mL/hr over 30 Minutes Intravenous Once 11/13/20 0903 11/13/20 0915   11/13/20 0415  cefTRIAXone (ROCEPHIN) 1 g in sodium chloride 0.9 % 100 mL IVPB  Status:  Discontinued        1 g 200 mL/hr over 30 Minutes Intravenous  Once 11/13/20 0407 11/13/20 0408      Subjective: Reports feeling better today  Objective: Vitals:   11/14/20 1958 11/14/20 2353 11/15/20 0447 11/15/20 0809  BP: 100/78 (!) 117/57 139/61 (!) 147/60  Pulse: (!) 140 64 70 63  Resp: 18 18 16 13   Temp: 98.5 F (36.9 C) 98 F (36.7 C) 97.8 F (36.6 C) 98.9 F (37.2 C)  TempSrc: Oral Oral Oral Oral  SpO2: 97% 100% 98% 99%  Weight:      Height:        Intake/Output Summary (Last 24 hours) at 11/15/2020 1528 Last data filed at 11/15/2020 1039 Gross per 24 hour  Intake 480 ml   Output 1125 ml  Net -645 ml   Filed Weights   11/13/20 0900 11/14/20 1158  Weight: 121.1 kg 114.9 kg    Examination: General exam: Awake, laying in bed, in nad Respiratory system: Normal respiratory effort, no wheezing Cardiovascular system: regular rate, s1, s2 Gastrointestinal system: Soft, nondistended, positive BS Central nervous system: CN2-12 grossly intact, strength intact Extremities: Perfused, no clubbing Skin: Normal skin turgor, no notable skin lesions seen Psychiatry: Mood normal // no visual hallucinations   Data Reviewed: I have personally reviewed following labs and imaging studies  CBC: Recent Labs  Lab 11/12/20 1727 11/13/20 0813 11/14/20 0452 11/15/20 0503  WBC 5.5  --  5.0 5.2  NEUTROABS  --   --  1.7 2.0  HGB 11.8* 11.2*  10.2* 9.3*  HCT 36.8 33.0* 32.8* 30.2*  MCV 93.6  --  95.3 95.6  PLT 250  --  198 106   Basic Metabolic Panel: Recent Labs  Lab 11/12/20 1727 11/13/20 0813 11/13/20 0840 11/13/20 1520 11/14/20 0452 11/15/20 0503  NA 129* 130* 131* 131* 130* 135  K 4.8 3.9 3.8 4.4 4.9 4.6  CL 95*  --  98 98 98 103  CO2 20*  --  18* 22 21* 24  GLUCOSE 572*  --  304* 236* 450* 275*  BUN 35*  --  39* 34* 30* 21  CREATININE 1.84*  --  1.93* 1.55* 1.87* 1.41*  CALCIUM 9.7  --  9.9 9.5 9.2 9.1   GFR: Estimated Creatinine Clearance: 46.7 mL/min (A) (by C-G formula based on SCr of 1.41 mg/dL (H)). Liver Function Tests: Recent Labs  Lab 11/14/20 0452 11/15/20 0503  AST 37 38  ALT 45* 46*  ALKPHOS 138* 129*  BILITOT 0.8 0.8  PROT 7.0 6.1*  ALBUMIN 3.1* 2.9*   No results for input(s): LIPASE, AMYLASE in the last 168 hours. No results for input(s): AMMONIA in the last 168 hours. Coagulation Profile: No results for input(s): INR, PROTIME in the last 168 hours. Cardiac Enzymes: No results for input(s): CKTOTAL, CKMB, CKMBINDEX, TROPONINI in the last 168 hours. BNP (last 3 results) No results for input(s): PROBNP in the last 8760  hours. HbA1C: Recent Labs    11/13/20 0904  HGBA1C 10.3*   CBG: Recent Labs  Lab 11/14/20 1309 11/14/20 1626 11/14/20 2143 11/15/20 0807 11/15/20 1135  GLUCAP 257* 209* 319* 291* 299*   Lipid Profile: No results for input(s): CHOL, HDL, LDLCALC, TRIG, CHOLHDL, LDLDIRECT in the last 72 hours. Thyroid Function Tests: No results for input(s): TSH, T4TOTAL, FREET4, T3FREE, THYROIDAB in the last 72 hours. Anemia Panel: Recent Labs    11/13/20 0840  FERRITIN 453*   Sepsis Labs: Recent Labs  Lab 11/13/20 0840  PROCALCITON 0.34    Recent Results (from the past 240 hour(s))  SARS Coronavirus 2 by RT PCR (hospital order, performed in [facility] hospital lab) Nasopharyngeal Nasopharyngeal Swab     Status: Abnormal   Collection Time: 11/13/20  4:33 AM   Specimen: Nasopharyngeal Swab  Result Value Ref Range Status   SARS Coronavirus 2 POSITIVE (A) NEGATIVE Final    Comment: RESULT CALLED TO, READ BACK BY AND VERIFIED WITH: JASMINE J RN @0546  11/13/20 EB (NOTE) SARS-CoV-2 target nucleic acids are DETECTED  SARS-CoV-2 RNA is generally detectable in upper respiratory specimens  during the acute phase of infection.  Positive results are indicative  of the presence of the identified virus, but do not rule out bacterial infection or co-infection with other pathogens not detected by the test.  Clinical correlation with patient history and  other diagnostic information is necessary to determine patient infection status.  The expected result is negative.  Fact Sheet for Patients:   StrictlyIdeas.no   Fact Sheet for Healthcare Providers:   BankingDealers.co.za    This test is not yet approved or cleared by the [place] FDA and  has been authorized for detection and/or diagnosis of SARS-CoV-2 by FDA under an Emergency Use Authorization (EUA).  This EUA will remain in effect (meaning this test ca n be used) for the duration of   the COVID-19 declaration under Section 564(b)(1) of the Act, 21 U.S.C. section 360-bbb-3(b)(1), unless the authorization is terminated or revoked sooner.  Performed at [facility], [place]  [address] [place], [place] [address]      Radiology Studies: ECHOCARDIOGRAM LIMITED  Result Date: 11/14/2020    ECHOCARDIOGRAM LIMITED REPORT   Patient Name:   YANIELIZ SUMMERLIN Date of Exam: 11/14/2020 Medical Rec #:  [id number]       Height:       63.0 in Accession #:    [id number]      Weight:       267.0 lb Date of Birth:  27-Jan-1951       BSA:          2.186 m Patient Age:    42 years        BP:           116/77 mmHg Patient Gender: F               HR:           87 bpm. Exam Location:  Inpatient Procedure: Limited Echo, Cardiac Doppler and Color Doppler Indications:    Atrial fibrillation  History:        Patient has prior history of Echocardiogram examinations, most                 recent 05/28/2020. Cardiomyopathy, Arrythmias:Atrial                 Fibrillation, Signs/Symptoms:Shortness of Breath and resp.                 failure; Risk Factors:Hypertension, morbid obesity, Dyslipidemia                 and Diabetes. COVID+.  Sonographer:    Dustin Flock Referring Phys: [phone] RIPUDEEP K RAI  Sonographer Comments: Patient is morbidly obese and Technically difficult study due to poor echo windows. Image acquisition challenging due to patient body habitus. Covid+ IMPRESSIONS  1. Left ventricular ejection fraction, by estimation, is 40 to 45%. The left ventricle has mildly decreased function. Left ventricular endocardial border not optimally defined to evaluate regional wall motion. There is moderate left ventricular hypertrophy. Left ventricular diastolic parameters are indeterminate.  2. Right ventricular systolic function was not well visualized. The right ventricular size is normal. Tricuspid regurgitation signal is inadequate for assessing PA pressure.  3. Left atrial size was mild to moderately dilated.   4. The mitral valve is normal in structure. No evidence of mitral valve regurgitation. No evidence of mitral stenosis.  5. The aortic valve was not well visualized. Aortic valve regurgitation is not visualized. Mild aortic valve sclerosis is present, with no evidence of aortic valve stenosis.  6. The inferior vena cava is normal in size with greater than 50% respiratory variability, suggesting right atrial pressure of 3 mmHg.  7. Technically difficult study with poor acoustic windows. FINDINGS  Left Ventricle: Left ventricular ejection fraction, by estimation, is 40 to 45%. The left ventricle has mildly decreased function. Left ventricular endocardial border not optimally defined to evaluate regional wall motion. The left ventricular internal cavity size was normal in size. There is moderate left ventricular hypertrophy. Left ventricular diastolic parameters are indeterminate. Right Ventricle: The right ventricular size is normal. Right vetricular wall thickness was not well visualized. Right ventricular systolic function was not well visualized. Tricuspid regurgitation signal is inadequate for assessing PA pressure. Left Atrium: Left atrial size was mild to moderately dilated. Right Atrium: Right atrial size was normal in size. Mitral Valve: The mitral valve is normal in structure. No evidence of mitral valve stenosis. Tricuspid Valve: The tricuspid valve is normal  in structure. Tricuspid valve regurgitation is not demonstrated. Aortic Valve: The aortic valve was not well visualized. Aortic valve regurgitation is not visualized. Mild aortic valve sclerosis is present, with no evidence of aortic valve stenosis. Aorta: The aortic root is normal in size and structure. Venous: The inferior vena cava is normal in size with greater than 50% respiratory variability, suggesting right atrial pressure of 3 mmHg. LEFT VENTRICLE PLAX 2D LVIDd:         4.70 cm  Diastology LVIDs:         2.70 cm  LV e' medial:    10.30 cm/s LV  PW:         1.50 cm  LV E/e' medial:  7.4 LV IVS:        1.50 cm  LV e' lateral:   6.74 cm/s LVOT diam:     2.50 cm  LV E/e' lateral: 11.3 LVOT Area:     4.91 cm  LEFT ATRIUM         Index LA diam:    4.30 cm 1.97 cm/m   AORTA Ao Root diam: 3.10 cm MITRAL VALVE MV Area (PHT): 3.85 cm    SHUNTS MV Decel Time: 197 msec    Systemic Diam: 2.50 cm MV E velocity: 76.40 cm/s MV A velocity: 32.50 cm/s MV E/A ratio:  2.35 Loralie Champagne MD Electronically signed by Loralie Champagne MD Signature Date/Time: 11/14/2020/6:36:36 PM    Final     Scheduled Meds: . vitamin C  500 mg Oral Daily  . dabigatran  150 mg Oral BID  . docusate sodium  100 mg Oral BID  . insulin aspart  0-15 Units Subcutaneous TID WC  . insulin aspart  0-5 Units Subcutaneous QHS  . insulin glargine  24 Units Subcutaneous Daily  . metoprolol tartrate  25 mg Oral BID  . zinc sulfate  220 mg Oral Daily   Continuous Infusions: . remdesivir 100 mg in NS 100 mL 100 mg (11/15/20 0904)     LOS: 2 days   Marylu Lund, MD [facility] Pager On Amion  If 7PM-7AM, please contact night-coverage 11/15/2020, 3:28 PM

## 2020-11-15 NOTE — Plan of Care (Signed)
  Problem: Education: Goal: Knowledge of risk factors and measures for prevention of condition will improve Outcome: Progressing   Problem: Coping: Goal: Psychosocial and spiritual needs will be supported Outcome: Progressing   Problem: Respiratory: Goal: Will maintain a patent airway Outcome: Progressing Goal: Complications related to the disease process, condition or treatment will be avoided or minimized Outcome: Progressing   

## 2020-11-16 DIAGNOSIS — N179 Acute kidney failure, unspecified: Secondary | ICD-10-CM

## 2020-11-16 LAB — COMPREHENSIVE METABOLIC PANEL
ALT: 50 U/L — ABNORMAL HIGH (ref 0–44)
AST: 42 U/L — ABNORMAL HIGH (ref 15–41)
Albumin: 2.9 g/dL — ABNORMAL LOW (ref 3.5–5.0)
Alkaline Phosphatase: 113 U/L (ref 38–126)
Anion gap: 8 (ref 5–15)
BUN: 23 mg/dL (ref 8–23)
CO2: 23 mmol/L (ref 22–32)
Calcium: 8.9 mg/dL (ref 8.9–10.3)
Chloride: 102 mmol/L (ref 98–111)
Creatinine, Ser: 1.2 mg/dL — ABNORMAL HIGH (ref 0.44–1.00)
GFR, Estimated: 49 mL/min — ABNORMAL LOW (ref >60)
Glucose, Bld: 244 mg/dL — ABNORMAL HIGH (ref 70–99)
Potassium: 4.5 mmol/L (ref 3.5–5.1)
Sodium: 133 mmol/L — ABNORMAL LOW (ref 135–145)
Total Bilirubin: 0.4 mg/dL (ref 0.3–1.2)
Total Protein: 6.3 g/dL — ABNORMAL LOW (ref 6.5–8.1)

## 2020-11-16 LAB — CBC WITH DIFFERENTIAL/PLATELET
Abs Immature Granulocytes: 0.01 10*3/uL (ref 0.00–0.07)
Basophils Absolute: 0 10*3/uL (ref 0.0–0.1)
Basophils Relative: 1 %
Eosinophils Absolute: 0.2 10*3/uL (ref 0.0–0.5)
Eosinophils Relative: 4 %
HCT: 27.1 % — ABNORMAL LOW (ref 36.0–46.0)
Hemoglobin: 8.8 g/dL — ABNORMAL LOW (ref 12.0–15.0)
Immature Granulocytes: 0 %
Lymphocytes Relative: 46 %
Lymphs Abs: 2.5 10*3/uL (ref 0.7–4.0)
MCH: 30.8 pg (ref 26.0–34.0)
MCHC: 32.5 g/dL (ref 30.0–36.0)
MCV: 94.8 fL (ref 80.0–100.0)
Monocytes Absolute: 0.6 10*3/uL (ref 0.1–1.0)
Monocytes Relative: 10 %
Neutro Abs: 2 10*3/uL (ref 1.7–7.7)
Neutrophils Relative %: 39 %
Platelets: 174 10*3/uL (ref 150–400)
RBC: 2.86 MIL/uL — ABNORMAL LOW (ref 3.87–5.11)
RDW: 13.7 % (ref 11.5–15.5)
WBC: 5.3 10*3/uL (ref 4.0–10.5)
nRBC: 0 % (ref 0.0–0.2)

## 2020-11-16 LAB — GLUCOSE, CAPILLARY
Glucose-Capillary: 218 mg/dL — ABNORMAL HIGH (ref 70–99)
Glucose-Capillary: 229 mg/dL — ABNORMAL HIGH (ref 70–99)
Glucose-Capillary: 271 mg/dL — ABNORMAL HIGH (ref 70–99)

## 2020-11-16 LAB — C-REACTIVE PROTEIN: CRP: 0.7 mg/dL (ref <1.0)

## 2020-11-16 LAB — D-DIMER, QUANTITATIVE: D-Dimer, Quant: 0.32 ug/mL-FEU (ref 0.00–0.50)

## 2020-11-16 MED ORDER — NOVOLOG FLEXPEN 100 UNIT/ML ~~LOC~~ SOPN: 5.0000 [IU] | PEN_INJECTOR | Freq: Three times a day (TID) | SUBCUTANEOUS | 0 refills | Status: DC

## 2020-11-16 MED ORDER — METOPROLOL TARTRATE 25 MG PO TABS: 25.0000 mg | ORAL_TABLET | Freq: Two times a day (BID) | ORAL | 0 refills | Status: DC

## 2020-11-16 MED ORDER — INSULIN GLARGINE 100 UNIT/ML SOLOSTAR PEN: 30.0000 [IU] | PEN_INJECTOR | Freq: Every day | SUBCUTANEOUS | 0 refills | Status: DC

## 2020-11-16 NOTE — Plan of Care (Signed)
  Problem: Education: Goal: Knowledge of risk factors and measures for prevention of condition will improve Outcome: Progressing   Problem: Coping: Goal: Psychosocial and spiritual needs will be supported Outcome: Progressing   Problem: Respiratory: Goal: Will maintain a patent airway Outcome: Progressing Goal: Complications related to the disease process, condition or treatment will be avoided or minimized Outcome: Progressing   

## 2020-11-16 NOTE — Discharge Summary (Signed)
Physician Discharge Summary  Stephanie [name] ZOX:[id number] DOB: 1951/03/09 DOA: 11/12/2020  PCP: Lilian Coma., MD  Admit date: 11/12/2020 Discharge date: 11/16/2020  Admitted From: Home Disposition:  Home  Recommendations for Outpatient Follow-up:  1. Follow up with PCP in 1-2 weeks 2. Follow up with Cardiology as scheduled  Discharge Condition:Improved CODE STATUS:Full Diet recommendation: Diabetic   Brief/Interim Summary: 70 year old female with history of CAD, diabetes mellitus, IDDM, chronic diastolic CHF, persistent atrial fibrillation status post DCCV 07/2009 on Pradaxa, history of sarcoidosis presented to ED with increasing shortness of breath with exertion. Patient reported that she started having symptoms after she went on a jury duty on January 18thand may have been exposed thereas many people were congested and coughing.She felt chills, fevers, loss of appetite myalgias and fatigue. She started having shortness of breath, which has been progressively worsening. Due to nausea and loss of appetite, she has not been taking her medications also. Patient states she barely ate in the last 2 days. Patient was found to be in rapid atrial fibrillation with RVR, heart rate in 120s, was placed on Cardizem drip. BP was lowest in 70s however improved to 113/89 at the time of admit.  Discharge Diagnoses:  Principal Problem:   Acute respiratory disease due to COVID-19 virus Active Problems:   Essential hypertension   Chronic diastolic heart failure (HCC)   Obesity   Atrial fibrillation with rapid ventricular response (HCC)   NICM (nonischemic cardiomyopathy) ([place])   AKI (acute kidney injury) ([place])   Hyperosmolar hyperglycemic state (HHS) ([place])  Principal Problem: Acute respiratory disease due to COVID-19 virus -COVID-19 test positive, symptomatic, with shortness of breath, myalgias, loss of appetite, dehydration. Chest x-ray showed lungs are clear -completed 4 days of IV  remdesivir per pharmacy protocol. -Remained on room air, thus did not require steroids -CRP remained less than 1 -Placed on antitussives, vitamin C, zinc, incentive spirometry  Active Problems: Atrial fibrillation with rapid ventricular response (HCC) -Likely precipitated due to #1 and not taking her medications(nausea, loss of appetite) -Patient was initially placed on IV Cardizem drip, will continue for goal heart rate less than 100 -Initially resumedMultaqat time of presentation  -Continued on Pradaxa  -Cardiology was consulted. Initially on IV cardizem gtt. Multaq now on hold, hold entresto and spironolactone as well as coreg given soft bp per Cardiology -Pt is now on metoprolol 25mg  po bid  Essential hypertension -BP presented borderline soft, cont to hold off on Lasix, Entresto, Coreg, Aldactone -Now on metoprolol. BP stable  Chronic combined systolic anddiastolic heart failure ([place]), NICM -Compensated, currently appears somewhat dehydrated, hold off on Lasix, Entresto, spironolactone -2D echo 05/2020 had shown EF of 45 to 50% with global hypokinesis, indeterminate diastolic parameters -Pt to follow up closely with Cardiology when discharged  AKI (acute kidney injury) (HCC)on CKD stage IIIa -Baseline creatinine 1.1-1.2 -Presented with creatinine of 1.8, likely due to #1, dehydration, medications -Hold off on Lasix and spironolactone, received IVF while in hosptial  Hyperosmolar hyperglycemic state (HHS) (HCC)in the setting of uncontrolled diabetes mellitus -Patient had not been taking her medications in the last few days, CBG 572 on arrival, bicarb 20, anion gap 14 -Initially startedon IV insulin drip, IV fluid hydration -Hemoglobin A1c 10.3, diabetic coordinator consulted -Pt now transitioned to subq insulin, lantus 30 units with SSI coverage and 4 units meal coverage  Discharge Instructions   Allergies as of 11/16/2020      Reactions   Amiodarone  Other (See Comments)   adverse reaction: Tremor/gait  disurbance   Aspirin Other (See Comments)   "Burns my stomach"   Methimazole Nausea And Vomiting   Propoxyphene Nausea And Vomiting   Albuterol Palpitations   Atorvastatin Other (See Comments)   Body aches and pains--stiffness.    Livalo [pitavastatin]    Body aches      Medication List    STOP taking these medications   carvedilol 25 MG tablet Commonly known as: COREG   dronedarone 400 MG tablet Commonly known as: MULTAQ   Entresto 24-26 MG Generic drug: sacubitril-valsartan   Jardiance 10 MG Tabs tablet Generic drug: empagliflozin   metFORMIN 500 MG tablet Commonly known as: GLUCOPHAGE     TAKE these medications   furosemide 40 MG tablet Commonly known as: LASIX TAKE 1.5 TABLETS BY MOUTH DAILY, IF AN ADDITIONAL WEIGHT GAIN OF 2-3 POUNDS TAKE 1 EXTRA TABLET What changed: See the new instructions.   ibuprofen 200 MG tablet Commonly known as: ADVIL Take 400 mg by mouth every 6 (six) hours as needed for headache or mild pain.   insulin glargine 100 UNIT/ML Solostar Pen Commonly known as: LANTUS Inject 30 Units into the skin daily.   Insulin Pen Needle 30G X 8 MM Misc Commonly known as: NOVOFINE For Humalog meal time TID   metoprolol tartrate 25 MG tablet Commonly known as: LOPRESSOR Take 1 tablet (25 mg total) by mouth 2 (two) times daily.   NovoLOG FlexPen 100 UNIT/ML FlexPen Generic drug: insulin aspart Inject 5 Units into the skin 3 (three) times daily with meals. What changed:   how much to take  when to take this   Pradaxa 150 MG Caps capsule Generic drug: dabigatran TAKE 1 CAPSULE BY MOUTH TWICE DAILY What changed: how much to take   simvastatin 10 MG tablet Commonly known as: ZOCOR Take 10 mg by mouth daily.   spironolactone 25 MG tablet Commonly known as: ALDACTONE TAKE 1 TABLET(25 MG) BY MOUTH DAILY What changed: See the new instructions.       Follow-up Information    Shirley Friar, PA-C Follow up.   Specialty: Physician Assistant Why: Your appointment on 11/21/2020 was cancelled and rescheduled for 12/05/2020 at 9:15am. Contact information: [address] [place] [address] [phone]        Lilian Coma., MD. Schedule an appointment as soon as possible for a visit in 2 week(s).   Specialty: Internal Medicine       Deboraha Sprang, MD .   Specialty: Cardiology Contact information: [address] [place] [address] [phone]              Allergies  Allergen Reactions  . Amiodarone Other (See Comments)    adverse reaction: Tremor/gait disurbance  . Aspirin Other (See Comments)    "Burns my stomach"  . Methimazole Nausea And Vomiting  . Propoxyphene Nausea And Vomiting  . Albuterol Palpitations  . Atorvastatin Other (See Comments)    Body aches and pains--stiffness.   [name] [Pitavastatin]     Body aches    Consultations:  Cardiology  Procedures/Studies: DG Chest 2 View  Result Date: 11/12/2020 CLINICAL DATA:  Shortness of breath for several days. Sarcoidosis. Atrial fibrillation. EXAM: CHEST - 2 VIEW COMPARISON:  03/02/2018 FINDINGS: The heart size and mediastinal contours are within normal limits. Both lungs are clear. The visualized skeletal structures are unremarkable. IMPRESSION: No active cardiopulmonary disease. Electronically Signed   By: Marlaine Hind M.D.   On: 11/12/2020 18:22  DG Cervical Spine 1 View  Result Date: 11/13/2020 CLINICAL DATA:  70 year old female with shortness of breath. Query abnormal epiglottis. EXAM: DG CERVICAL SPINE - 1 VIEW COMPARISON:  Neck radiographs 05/16/2014. FINDINGS: The epiglottis contour is normal, stable since 2015. The hypopharynx is distended with gas today. Negative visible trachea. Other pharyngeal soft tissue contours appear normal, with no retropharyngeal or prevertebral thickening. Advanced cervical spine degeneration. IMPRESSION: 1.  Epiglottis is normal. 2. Hypopharynx distended with gas, which can be seen with laryngotracheobronchitis (croup). Electronically Signed   By: Genevie Ann M.D.   On: 11/13/2020 06:26   ECHOCARDIOGRAM LIMITED  Result Date: 11/14/2020    ECHOCARDIOGRAM LIMITED REPORT   Patient Name:   Stephanie [name] Date of Exam: 11/14/2020 Medical Rec #:  [id number]       Height:       63.0 in Accession #:    [id number]      Weight:       267.0 lb Date of Birth:  Dec 04, 1950       BSA:          2.186 m Patient Age:    57 years        BP:           116/77 mmHg Patient Gender: F               HR:           87 bpm. Exam Location:  Inpatient Procedure: Limited Echo, Cardiac Doppler and Color Doppler Indications:    Atrial fibrillation  History:        Patient has prior history of Echocardiogram examinations, most                 recent 05/28/2020. Cardiomyopathy, Arrythmias:Atrial                 Fibrillation, Signs/Symptoms:Shortness of Breath and resp.                 failure; Risk Factors:Hypertension, morbid obesity, Dyslipidemia                 and Diabetes. COVID+.  Sonographer:    Dustin Flock Referring Phys: [phone] RIPUDEEP K RAI  Sonographer Comments: Patient is morbidly obese and Technically difficult study due to poor echo windows. Image acquisition challenging due to patient body habitus. Covid+ IMPRESSIONS  1. Left ventricular ejection fraction, by estimation, is 40 to 45%. The left ventricle has mildly decreased function. Left ventricular endocardial border not optimally defined to evaluate regional wall motion. There is moderate left ventricular hypertrophy. Left ventricular diastolic parameters are indeterminate.  2. Right ventricular systolic function was not well visualized. The right ventricular size is normal. Tricuspid regurgitation signal is inadequate for assessing PA pressure.  3. Left atrial size was mild to moderately dilated.  4. The mitral valve is normal in structure. No evidence of mitral valve regurgitation.  No evidence of mitral stenosis.  5. The aortic valve was not well visualized. Aortic valve regurgitation is not visualized. Mild aortic valve sclerosis is present, with no evidence of aortic valve stenosis.  6. The inferior vena cava is normal in size with greater than 50% respiratory variability, suggesting right atrial pressure of 3 mmHg.  7. Technically difficult study with poor acoustic windows. FINDINGS  Left Ventricle: Left ventricular ejection fraction, by estimation, is 40 to 45%. The left ventricle has mildly decreased function. Left ventricular endocardial border not optimally defined to evaluate regional wall motion. The left ventricular internal  cavity size was normal in size. There is moderate left ventricular hypertrophy. Left ventricular diastolic parameters are indeterminate. Right Ventricle: The right ventricular size is normal. Right vetricular wall thickness was not well visualized. Right ventricular systolic function was not well visualized. Tricuspid regurgitation signal is inadequate for assessing PA pressure. Left Atrium: Left atrial size was mild to moderately dilated. Right Atrium: Right atrial size was normal in size. Mitral Valve: The mitral valve is normal in structure. No evidence of mitral valve stenosis. Tricuspid Valve: The tricuspid valve is normal in structure. Tricuspid valve regurgitation is not demonstrated. Aortic Valve: The aortic valve was not well visualized. Aortic valve regurgitation is not visualized. Mild aortic valve sclerosis is present, with no evidence of aortic valve stenosis. Aorta: The aortic root is normal in size and structure. Venous: The inferior vena cava is normal in size with greater than 50% respiratory variability, suggesting right atrial pressure of 3 mmHg. LEFT VENTRICLE PLAX 2D LVIDd:         4.70 cm  Diastology LVIDs:         2.70 cm  LV e' medial:    10.30 cm/s LV PW:         1.50 cm  LV E/e' medial:  7.4 LV IVS:        1.50 cm  LV e' lateral:   6.74  cm/s LVOT diam:     2.50 cm  LV E/e' lateral: 11.3 LVOT Area:     4.91 cm  LEFT ATRIUM         Index LA diam:    4.30 cm 1.97 cm/m   AORTA Ao Root diam: 3.10 cm MITRAL VALVE MV Area (PHT): 3.85 cm    SHUNTS MV Decel Time: 197 msec    Systemic Diam: 2.50 cm MV E velocity: 76.40 cm/s MV A velocity: 32.50 cm/s MV E/A ratio:  2.35 Loralie Champagne MD Electronically signed by Loralie Champagne MD Signature Date/Time: 11/14/2020/6:36:36 PM    Final      Subjective: Eager to go home  Discharge Exam: Vitals:   11/16/20 0915 11/16/20 1420  BP: 128/66 (!) 150/71  Pulse: 61 76  Resp: 14 20  Temp: 98.4 F (36.9 C) 98 F (36.7 C)  SpO2: 98% 100%   Vitals:   11/15/20 2100 11/16/20 0346 11/16/20 0915 11/16/20 1420  BP: (!) 131/50 127/70 128/66 (!) 150/71  Pulse: 66 61 61 76  Resp: 16 19 14 20   Temp: 97.8 F (36.6 C) 97.7 F (36.5 C) 98.4 F (36.9 C) 98 F (36.7 C)  TempSrc: Oral Oral Oral Oral  SpO2: 99% 100% 98% 100%  Weight:      Height:        General: Pt is alert, awake, not in acute distress Cardiovascular: RRR, S1/S2 +, no rubs, no gallops Respiratory: CTA bilaterally, no wheezing, no rhonchi Abdominal: Soft, NT, ND, bowel sounds + Extremities: no edema, no cyanosis   The results of significant diagnostics from this hospitalization (including imaging, microbiology, ancillary and laboratory) are listed below for reference.     Microbiology: Recent Results (from the past 240 hour(s))  SARS Coronavirus 2 by RT PCR (hospital order, performed in [facility] hospital lab) Nasopharyngeal Nasopharyngeal Swab     Status: Abnormal   Collection Time: 11/13/20  4:33 AM   Specimen: Nasopharyngeal Swab  Result Value Ref Range Status   SARS Coronavirus 2 POSITIVE (A) NEGATIVE Final    Comment: RESULT CALLED TO, READ BACK BY AND VERIFIED WITH: JASMINE  J RN @0546  11/13/20 EB (NOTE) SARS-CoV-2 target nucleic acids are DETECTED  SARS-CoV-2 RNA is generally detectable in upper respiratory  specimens  during the acute phase of infection.  Positive results are indicative  of the presence of the identified virus, but do not rule out bacterial infection or co-infection with other pathogens not detected by the test.  Clinical correlation with patient history and  other diagnostic information is necessary to determine patient infection status.  The expected result is negative.  Fact Sheet for Patients:   StrictlyIdeas.no   Fact Sheet for Healthcare Providers:   BankingDealers.co.za    This test is not yet approved or cleared by the [place] FDA and  has been authorized for detection and/or diagnosis of SARS-CoV-2 by FDA under an Emergency Use Authorization (EUA).  This EUA will remain in effect (meaning this test ca n be used) for the duration of  the COVID-19 declaration under Section 564(b)(1) of the Act, 21 U.S.C. section 360-bbb-3(b)(1), unless the authorization is terminated or revoked sooner.  Performed at [facility], [place] [address], [place], [place] [address]      Labs: BNP (last 3 results) Recent Labs    11/13/20 0842  BNP [id number]   Basic Metabolic Panel: Recent Labs  Lab 11/13/20 0840 11/13/20 1520 11/14/20 0452 11/15/20 0503 11/16/20 0124  NA 131* 131* 130* 135 133*  K 3.8 4.4 4.9 4.6 4.5  CL 98 98 98 103 102  CO2 18* 22 21* 24 23  GLUCOSE 304* 236* 450* 275* 244*  BUN 39* 34* 30* 21 23  CREATININE 1.93* 1.55* 1.87* 1.41* 1.20*  CALCIUM 9.9 9.5 9.2 9.1 8.9   Liver Function Tests: Recent Labs  Lab 11/14/20 0452 11/15/20 0503 11/16/20 0124  AST 37 38 42*  ALT 45* 46* 50*  ALKPHOS 138* 129* 113  BILITOT 0.8 0.8 0.4  PROT 7.0 6.1* 6.3*  ALBUMIN 3.1* 2.9* 2.9*   No results for input(s): LIPASE, AMYLASE in the last 168 hours. No results for input(s): AMMONIA in the last 168 hours. CBC: Recent Labs  Lab 11/12/20 1727 11/13/20 0813 11/14/20 0452 11/15/20 0503 11/16/20 0124  WBC  5.5  --  5.0 5.2 5.3  NEUTROABS  --   --  1.7 2.0 2.0  HGB 11.8* 11.2* 10.2* 9.3* 8.8*  HCT 36.8 33.0* 32.8* 30.2* 27.1*  MCV 93.6  --  95.3 95.6 94.8  PLT 250  --  198 163 174   Cardiac Enzymes: No results for input(s): CKTOTAL, CKMB, CKMBINDEX, TROPONINI in the last 168 hours. BNP: Invalid input(s): POCBNP CBG: Recent Labs  Lab 11/15/20 1135 11/15/20 1551 11/15/20 2148 11/16/20 0857 11/16/20 1312  GLUCAP 299* 292* 157* 218* 229*   D-Dimer Recent Labs    11/15/20 0503 11/16/20 0124  DDIMER 0.28 0.32   Hgb A1c No results for input(s): HGBA1C in the last 72 hours. Lipid Profile No results for input(s): CHOL, HDL, LDLCALC, TRIG, CHOLHDL, LDLDIRECT in the last 72 hours. Thyroid function studies No results for input(s): TSH, T4TOTAL, T3FREE, THYROIDAB in the last 72 hours.  Invalid input(s): FREET3 Anemia work up No results for input(s): VITAMINB12, FOLATE, FERRITIN, TIBC, IRON, RETICCTPCT in the last 72 hours. Urinalysis    Component Value Date/Time   COLORURINE STRAW (A) 11/13/2020 0412   APPEARANCEUR CLEAR 11/13/2020 0412   LABSPEC 1.026 11/13/2020 0412   PHURINE 5.0 11/13/2020 0412   GLUCOSEU >=500 (A) 11/13/2020 0412   HGBUR NEGATIVE 11/13/2020 0412   HGBUR negative 09/02/2009  [place] 11/13/2020 0412   BILIRUBINUR negative 06/22/2018 1454   BILIRUBINUR n 08/02/2015 1125   KETONESUR 5 (A) 11/13/2020 0412   PROTEINUR NEGATIVE 11/13/2020 0412   UROBILINOGEN negative 08/02/2015 1125   UROBILINOGEN 0.2 08/16/2011 2150   NITRITE NEGATIVE 11/13/2020 0412   LEUKOCYTESUR NEGATIVE 11/13/2020 0412   Sepsis Labs Invalid input(s): PROCALCITONIN,  WBC,  LACTICIDVEN Microbiology Recent Results (from the past 240 hour(s))  SARS Coronavirus 2 by RT PCR (hospital order, performed in [place] hospital lab) Nasopharyngeal Nasopharyngeal Swab     Status: Abnormal   Collection Time: 11/13/20  4:33 AM   Specimen: Nasopharyngeal Swab  Result Value Ref  Range Status   SARS Coronavirus 2 POSITIVE (A) NEGATIVE Final    Comment: RESULT CALLED TO, READ BACK BY AND VERIFIED WITH: JASMINE J RN @0546  11/13/20 EB (NOTE) SARS-CoV-2 target nucleic acids are DETECTED  SARS-CoV-2 RNA is generally detectable in upper respiratory specimens  during the acute phase of infection.  Positive results are indicative  of the presence of the identified virus, but do not rule out bacterial infection or co-infection with other pathogens not detected by the test.  Clinical correlation with patient history and  other diagnostic information is necessary to determine patient infection status.  The expected result is negative.  Fact Sheet for Patients:   StrictlyIdeas.no   Fact Sheet for Healthcare Providers:   BankingDealers.co.za    This test is not yet approved or cleared by the [place] FDA and  has been authorized for detection and/or diagnosis of SARS-CoV-2 by FDA under an Emergency Use Authorization (EUA).  This EUA will remain in effect (meaning this test ca n be used) for the duration of  the COVID-19 declaration under Section 564(b)(1) of the Act, 21 U.S.C. section 360-bbb-3(b)(1), unless the authorization is terminated or revoked sooner.  Performed at [facility], [place] [address] [place], [place] [address]    Time spent: 9min  SIGNED:   Marylu Lund, MD  [facility] 11/16/2020, 3:20 PM  If 7PM-7AM, please contact night-coverage

## 2020-11-16 NOTE — Progress Notes (Signed)
AVS paperwork reviewed with pt. All questions answered. Pt discharged to home with son .

## 2020-11-19 DIAGNOSIS — I10 Essential (primary) hypertension: Secondary | ICD-10-CM | POA: Diagnosis not present

## 2020-11-19 DIAGNOSIS — E1165 Type 2 diabetes mellitus with hyperglycemia: Secondary | ICD-10-CM | POA: Diagnosis not present

## 2020-11-19 DIAGNOSIS — Z7984 Long term (current) use of oral hypoglycemic drugs: Secondary | ICD-10-CM | POA: Diagnosis not present

## 2020-11-19 DIAGNOSIS — E669 Obesity, unspecified: Secondary | ICD-10-CM | POA: Diagnosis not present

## 2020-11-21 ENCOUNTER — Ambulatory Visit: Payer: Medicare Other | Admitting: Student

## 2020-12-05 ENCOUNTER — Other Ambulatory Visit: Payer: Self-pay

## 2020-12-05 ENCOUNTER — Ambulatory Visit (INDEPENDENT_AMBULATORY_CARE_PROVIDER_SITE_OTHER): Payer: Medicare Other | Admitting: Student

## 2020-12-05 ENCOUNTER — Encounter: Payer: Self-pay | Admitting: Student

## 2020-12-05 VITALS — BP 132/84 | HR 67 | Ht 63.0 in | Wt 256.0 lb

## 2020-12-05 DIAGNOSIS — F32A Depression, unspecified: Secondary | ICD-10-CM | POA: Diagnosis not present

## 2020-12-05 DIAGNOSIS — I10 Essential (primary) hypertension: Secondary | ICD-10-CM | POA: Diagnosis not present

## 2020-12-05 DIAGNOSIS — E039 Hypothyroidism, unspecified: Secondary | ICD-10-CM

## 2020-12-05 DIAGNOSIS — G4700 Insomnia, unspecified: Secondary | ICD-10-CM | POA: Diagnosis not present

## 2020-12-05 DIAGNOSIS — I4891 Unspecified atrial fibrillation: Secondary | ICD-10-CM | POA: Diagnosis not present

## 2020-12-05 DIAGNOSIS — E785 Hyperlipidemia, unspecified: Secondary | ICD-10-CM | POA: Diagnosis not present

## 2020-12-05 DIAGNOSIS — I428 Other cardiomyopathies: Secondary | ICD-10-CM | POA: Diagnosis not present

## 2020-12-05 DIAGNOSIS — I4819 Other persistent atrial fibrillation: Secondary | ICD-10-CM | POA: Diagnosis not present

## 2020-12-05 DIAGNOSIS — N183 Chronic kidney disease, stage 3 unspecified: Secondary | ICD-10-CM | POA: Diagnosis not present

## 2020-12-05 DIAGNOSIS — E119 Type 2 diabetes mellitus without complications: Secondary | ICD-10-CM | POA: Diagnosis not present

## 2020-12-05 LAB — BASIC METABOLIC PANEL
BUN/Creatinine Ratio: 18 (ref 12–28)
BUN: 36 mg/dL — ABNORMAL HIGH (ref 8–27)
CO2: 23 mmol/L (ref 20–29)
Calcium: 10 mg/dL (ref 8.7–10.3)
Chloride: 95 mmol/L — ABNORMAL LOW (ref 96–106)
Creatinine, Ser: 2.05 mg/dL — ABNORMAL HIGH (ref 0.57–1.00)
GFR calc Af Amer: 28 mL/min/{1.73_m2} — ABNORMAL LOW (ref >59)
GFR calc non Af Amer: 24 mL/min/{1.73_m2} — ABNORMAL LOW (ref >59)
Glucose: 160 mg/dL — ABNORMAL HIGH (ref 65–99)
Potassium: 4 mmol/L (ref 3.5–5.2)
Sodium: 136 mmol/L (ref 134–144)

## 2020-12-05 LAB — MAGNESIUM: Magnesium: 2.1 mg/dL (ref 1.6–2.3)

## 2020-12-05 MED ORDER — METOPROLOL SUCCINATE ER 25 MG PO TB24: 25.0000 mg | ORAL_TABLET | Freq: Every day | ORAL | 3 refills | Status: DC

## 2020-12-05 MED ORDER — ENTRESTO 24-26 MG PO TABS: 1.0000 | ORAL_TABLET | Freq: Two times a day (BID) | ORAL | 1 refills | Status: DC

## 2020-12-05 NOTE — Progress Notes (Signed)
PCP:  Lilian Coma., MD Primary Cardiologist: No primary care provider on file. Electrophysiologist: [name], MD   Stephanie [name] is a 70 y.o. female seen today for [name], MD for post hospital follow up.  Since discharge from hospital the patient reports doing very well. She was scheduled for cardioversion but presented to the hospital with SOB. Was found to have and treated for COVID. She was in AF at the time, but intercurrently has converted to NSR on Multaq. She has not felt breakthrough. She is limiting caffeine and alcohol intake. She is also trying to lose weight.  she denies chest pain, palpitations, dyspnea, PND, orthopnea, nausea, vomiting, dizziness, syncope, edema, or early satiety.  AF history Failed tikosyn due to significant first dose QT prolongation Failed amiodarone with tremors and hyperthyroidism Converted on multaq and tolerating well thus far.   Past Medical History:  Diagnosis Date  . Anemia   . Ankle fracture, right    x 2  . Antineoplastic and immunosuppressive drugs causing adverse effect in therapeutic use   . Asthma   . Chronic venous hypertension without complications   . Colon polyps 2009  . Coronary artery disease    no CAD by cath 11.2010  . Depressive disorder, not elsewhere classified   . Diabetes mellitus type 2 in obese (HCC)    hgb AIC 6.1 09/02/2009, insulin dependent   . Diastolic CHF, chronic (HCC)    reports stable  . Diverticulosis of colon 2009   colonoscopy  . Gout    cortisone  shots helped  . Hepatitis, unspecified    hx of/per pt, never was told of having hepatitis  . Hx of hysterectomy 2002  . Hyperlipidemia   . Hypertension    controlled on medication   . Hypothyroidism    reports her thryoid levels are normal now   . Irritable bowel syndrome   . Lung nodule    81mm RLL nodule on CT Chest  07/2009, resolved by 2011 CT  . Noncompliance   . Obesity   . Persistent atrial fibrillation ([place])    s/p DCCV  07/2009; Pradaxa Rx, states she is in sinus rhythm now   . PONV (postoperative nausea and vomiting)    after receiving "too much anesthesia" after a hernia surgery   . Sarcoidosis    dx by eye exam; seen during eye exam , report asymtptomatic "i dont have it now'   . Sleep disturbance 06/2013   sleep study, mild abnormality, no criteria for CPAP   Past Surgical History:  Procedure Laterality Date  . ABDOMINAL HYSTERECTOMY    . CARDIOVERSION     x 2  . CARDIOVERSION N/A 02/27/2013   Procedure: CARDIOVERSION;  Surgeon: Lelon Perla, MD;  Location: [facility] ENDOSCOPY;  Service: Cardiovascular;  Laterality: N/A;  . CARDIOVERSION N/A 03/24/2017   Procedure: CARDIOVERSION;  Surgeon: Josue Hector, MD;  Location: [facility] ENDOSCOPY;  Service: Cardiovascular;  Laterality: N/A;  . CARDIOVERSION N/A 03/04/2020   Procedure: CARDIOVERSION;  Surgeon: Josue Hector, MD;  Location: [facility] ENDOSCOPY;  Service: Cardiovascular;  Laterality: N/A;  . CHOLECYSTECTOMY  early 64s  . COLONOSCOPY     Diverticulosis, colon polyps, repeat 2014, Dr. Deatra Ina  . COLONOSCOPY WITH PROPOFOL N/A 07/25/2019   Procedure: COLONOSCOPY WITH PROPOFOL;  Surgeon: Yetta Flock, MD;  Location: [facility] ENDOSCOPY;  Service: Gastroenterology;  Laterality: N/A;  . ESOPHAGOGASTRODUODENOSCOPY N/A 05/16/2014   Procedure: ESOPHAGOGASTRODUODENOSCOPY (EGD);  Surgeon: Wonda Horner, MD;  Location: [facility]  ORS;  Service: Gastroenterology;  Laterality: N/A;  . HERNIA REPAIR  [id number]   umbilical hernia  . MYOMECTOMY  2000  . POLYPECTOMY  07/25/2019   Procedure: POLYPECTOMY;  Surgeon: Yetta Flock, MD;  Location: [facility] ENDOSCOPY;  Service: Gastroenterology;;    Current Outpatient Medications  Medication Sig Dispense Refill  . furosemide (LASIX) 40 MG tablet TAKE 1.5 TABLETS BY MOUTH DAILY, IF AN ADDITIONAL WEIGHT GAIN OF 2-3 POUNDS TAKE 1 EXTRA TABLET 180 tablet 3  . ibuprofen (ADVIL) 200 MG tablet Take 400 mg by mouth every 6 (six) hours as needed for  headache or mild pain.     Marland Kitchen insulin glargine (LANTUS) 100 UNIT/ML Solostar Pen Inject 30 Units into the skin daily. 15 mL 0  . Insulin Pen Needle (NOVOFINE) 30G X 8 MM MISC For Humalog meal time TID 100 each 11  . metoprolol tartrate (LOPRESSOR) 25 MG tablet Take 1 tablet (25 mg total) by mouth 2 (two) times daily. 60 tablet 0  . NOVOLOG FLEXPEN 100 UNIT/ML FlexPen Inject 5 Units into the skin 3 (three) times daily with meals. 15 mL 0  . PRADAXA 150 MG CAPS capsule TAKE 1 CAPSULE BY MOUTH TWICE DAILY 60 capsule 8  . simvastatin (ZOCOR) 10 MG tablet Take 10 mg by mouth daily.     Marland Kitchen spironolactone (ALDACTONE) 25 MG tablet TAKE 1 TABLET(25 MG) BY MOUTH DAILY 90 tablet 3   No current facility-administered medications for this visit.    Allergies  Allergen Reactions  . Amiodarone Other (See Comments)    adverse reaction: Tremor/gait disurbance  . Aspirin Other (See Comments)    "Burns my stomach"  . Methimazole Nausea And Vomiting  . Propoxyphene Nausea And Vomiting  . Albuterol Palpitations  . Atorvastatin Other (See Comments)    Body aches and pains--stiffness.   [name] [Pitavastatin]     Body aches    Social History   Socioeconomic History  . Marital status: Single    Spouse name: Not on file  . Number of children: 5  . Years of education: college  . Highest education level: Not on file  Occupational History  . Occupation: Disabled.  Tobacco Use  . Smoking status: Never Smoker  . Smokeless tobacco: Never Used  Vaping Use  . Vaping Use: Never used  Substance and Sexual Activity  . Alcohol use: Yes    Alcohol/week: 0.0 standard drinks    Comment: rare  . Drug use: No  . Sexual activity: Not Currently    Birth control/protection: Surgical  Other Topics Concern  . Not on file  Social History Narrative   Pt lives in [place] with her son.  Unemployed.  Right handed.     Education college   Social Determinants of Radio broadcast assistant Strain: Not on file   Food Insecurity: Not on file  Transportation Needs: Not on file  Physical Activity: Not on file  Stress: Not on file  Social Connections: Not on file  Intimate Partner Violence: Not on file    Review of Systems: All other systems reviewed and are otherwise negative except as noted above.  Physical Exam: Vitals:   12/05/20 0942  BP: 132/84  SpO2: 97%  Weight: 256 lb (116.1 kg)  Height: 5\' 3"  (1.6 m)    GEN- The patient is well appearing, alert and oriented x 3 today.   HEENT: normocephalic, atraumatic; sclera clear, conjunctiva pink; hearing intact; oropharynx clear; neck supple, no JVP Lymph- no cervical lymphadenopathy  Lungs- Clear to ausculation bilaterally, normal work of breathing.  No wheezes, rales, rhonchi Heart- Regular rate and rhythm, no murmurs, rubs or gallops, PMI not laterally displaced GI- soft, non-tender, non-distended, bowel sounds present, no hepatosplenomegaly Extremities- no clubbing, cyanosis, or edema; DP/PT/radial pulses 2+ bilaterally MS- no significant deformity or atrophy Skin- warm and dry, no rash or lesion Psych- euthymic mood, full affect Neuro- strength and sensation are intact  EKG is ordered. Personal review of EKG from today shows NSR at 67 bpm  Additional studies reviewed include: EP, AF, and recent admission notes.   Assessment and Plan:  1. Persistent Atrial Fibrillation (ICD10: I48.19) The patient's [id number] is 4, indicating a4.8% annual risk of stroke.  Since last EKG, she has converted to NSR on multaq.  Labs today  Patient will continue on Pradaxa 150 mg BID. She  Continue coreg   2. Secondary Hypercoagulable State (ICD10: D68.69) The patient is at significant risk for stroke/thromboembolism based uponherCHA2DS2-VAScScore of 4. Continue (Pradaxa).   3. Obesity Body mass index is 45.35 kg/m.  Lifestyle modification was discussed and encouraged including regular physical activity and weight  reduction.  4. NICM Suspected tachycardia mediated. EF decreased to 45-50% on last echo. No signs or symptoms of fluid overload. Hopefully this will improve with maintenance of SR. Consider recheck later this year.  Continue spiro Stop lopressor, start Toprol XL at 25 mg daily with mildly depressed EF.  Resume Entresto 24/26 mg BID. Labs today.   5. HTN As above.   RTC 4-6 weeks for recheck with resumption of meds as above.   Shirley Friar, PA-C  12/05/20 9:45 AM

## 2020-12-05 NOTE — Patient Instructions (Signed)
Medication Instructions:  Your physician has recommended you make the following change in your medication:   STOP: Metoprolol Tartrate START: Metoprolol Succinate 25mg  daily START: Entresto 24-26mg  One tablet two times daily   *If you need a refill on your cardiac medications before your next appointment, please call your pharmacy*   Lab Work: TODAY: BMET, MAG  If you have labs (blood work) drawn today and your tests are completely normal, you will receive your results only by: Marland Kitchen MyChart Message (if you have MyChart) OR . A paper copy in the mail If you have any lab test that is abnormal or we need to change your treatment, we will call you to review the results.   Follow-Up: At [facility], you and your health needs are our priority.  As part of our continuing mission to provide you with exceptional heart care, we have created designated Provider Care Teams.  These Care Teams include your primary Cardiologist (physician) and Advanced Practice Providers (APPs -  Physician Assistants and Nurse Practitioners) who all work together to provide you with the care you need, when you need it.  We recommend signing up for the patient portal called "MyChart".  Sign up information is provided on this After Visit Summary.  MyChart is used to connect with patients for Virtual Visits (Telemedicine).  Patients are able to view lab/test results, encounter notes, upcoming appointments, etc.  Non-urgent messages can be sent to your provider as well.   To learn more about what you can do with MyChart, go to [email or web address].    Your next appointment:   01/03/2021  The format for your next appointment:   In Person  Provider:   Legrand Como "[name]" Chal[place], [place]

## 2020-12-06 ENCOUNTER — Other Ambulatory Visit: Payer: Self-pay

## 2020-12-06 ENCOUNTER — Other Ambulatory Visit: Payer: Self-pay | Admitting: Internal Medicine

## 2020-12-06 DIAGNOSIS — E1165 Type 2 diabetes mellitus with hyperglycemia: Secondary | ICD-10-CM | POA: Diagnosis not present

## 2020-12-06 DIAGNOSIS — I428 Other cardiomyopathies: Secondary | ICD-10-CM

## 2020-12-06 DIAGNOSIS — I10 Essential (primary) hypertension: Secondary | ICD-10-CM | POA: Diagnosis not present

## 2020-12-06 DIAGNOSIS — E669 Obesity, unspecified: Secondary | ICD-10-CM | POA: Diagnosis not present

## 2020-12-06 DIAGNOSIS — I5022 Chronic systolic (congestive) heart failure: Secondary | ICD-10-CM

## 2020-12-06 DIAGNOSIS — I4819 Other persistent atrial fibrillation: Secondary | ICD-10-CM

## 2020-12-06 DIAGNOSIS — Z7984 Long term (current) use of oral hypoglycemic drugs: Secondary | ICD-10-CM | POA: Diagnosis not present

## 2020-12-09 ENCOUNTER — Other Ambulatory Visit: Payer: Self-pay

## 2020-12-09 ENCOUNTER — Other Ambulatory Visit: Payer: Medicare Other | Admitting: *Deleted

## 2020-12-09 DIAGNOSIS — I5022 Chronic systolic (congestive) heart failure: Secondary | ICD-10-CM | POA: Diagnosis not present

## 2020-12-09 DIAGNOSIS — I4819 Other persistent atrial fibrillation: Secondary | ICD-10-CM | POA: Diagnosis not present

## 2020-12-09 DIAGNOSIS — I428 Other cardiomyopathies: Secondary | ICD-10-CM | POA: Diagnosis not present

## 2020-12-10 ENCOUNTER — Other Ambulatory Visit: Payer: Self-pay

## 2020-12-10 DIAGNOSIS — I4819 Other persistent atrial fibrillation: Secondary | ICD-10-CM

## 2020-12-10 DIAGNOSIS — I5022 Chronic systolic (congestive) heart failure: Secondary | ICD-10-CM

## 2020-12-10 LAB — BASIC METABOLIC PANEL
BUN/Creatinine Ratio: 13 (ref 12–28)
BUN: 19 mg/dL (ref 8–27)
CO2: 19 mmol/L — ABNORMAL LOW (ref 20–29)
Calcium: 9.5 mg/dL (ref 8.7–10.3)
Chloride: 104 mmol/L (ref 96–106)
Creatinine, Ser: 1.43 mg/dL — ABNORMAL HIGH (ref 0.57–1.00)
GFR calc Af Amer: 43 mL/min/{1.73_m2} — ABNORMAL LOW (ref >59)
GFR calc non Af Amer: 38 mL/min/{1.73_m2} — ABNORMAL LOW (ref >59)
Glucose: 138 mg/dL — ABNORMAL HIGH (ref 65–99)
Potassium: 5.4 mmol/L — ABNORMAL HIGH (ref 3.5–5.2)
Sodium: 141 mmol/L (ref 134–144)

## 2020-12-10 MED ORDER — SPIRONOLACTONE 25 MG PO TABS: 12.5000 mg | ORAL_TABLET | Freq: Every day | ORAL | 3 refills | Status: DC

## 2020-12-10 MED ORDER — FUROSEMIDE 40 MG PO TABS: 40.0000 mg | ORAL_TABLET | Freq: Every day | ORAL | 3 refills | Status: DC

## 2020-12-18 ENCOUNTER — Other Ambulatory Visit: Payer: Medicare Other | Admitting: *Deleted

## 2020-12-18 ENCOUNTER — Other Ambulatory Visit: Payer: Self-pay

## 2020-12-18 DIAGNOSIS — I4819 Other persistent atrial fibrillation: Secondary | ICD-10-CM

## 2020-12-18 DIAGNOSIS — E103293 Type 1 diabetes mellitus with mild nonproliferative diabetic retinopathy without macular edema, bilateral: Secondary | ICD-10-CM | POA: Diagnosis not present

## 2020-12-18 DIAGNOSIS — I5022 Chronic systolic (congestive) heart failure: Secondary | ICD-10-CM

## 2020-12-18 DIAGNOSIS — H524 Presbyopia: Secondary | ICD-10-CM | POA: Diagnosis not present

## 2020-12-18 LAB — BASIC METABOLIC PANEL
BUN/Creatinine Ratio: 17 (ref 12–28)
BUN: 26 mg/dL (ref 8–27)
CO2: 20 mmol/L (ref 20–29)
Calcium: 10.1 mg/dL (ref 8.7–10.3)
Chloride: 102 mmol/L (ref 96–106)
Creatinine, Ser: 1.54 mg/dL — ABNORMAL HIGH (ref 0.57–1.00)
Glucose: 149 mg/dL — ABNORMAL HIGH (ref 65–99)
Potassium: 4.6 mmol/L (ref 3.5–5.2)
Sodium: 140 mmol/L (ref 134–144)
eGFR: 37 mL/min/{1.73_m2} — ABNORMAL LOW (ref >59)

## 2021-01-02 NOTE — Progress Notes (Unsigned)
PCP:  Lilian Coma., MD Primary Cardiologist: No primary care provider on file. Electrophysiologist: [name], MD   Stephanie [name] is a 70 y.o. female seen today for [name], MD for routine electrophysiology followup.  Since last being seen in our clinic the patient reports doing very well. She feels "better than she has in a while".  She denies symptoms of palpitations, chest pain, shortness of breath, orthopnea, PND, lower extremity edema, claudication, dizziness, presyncope, syncope, bleeding, or neurologic sequela. The patient is tolerating medications without difficulties.  She continues to limit caffeine and alcohol intake.   AF history Failed tikosyn due to significant first dose QT prolongation Failed amiodarone with tremors and hyperthyroidism Converted on multaq and tolerating well thus far.   Past Medical History:  Diagnosis Date  . Anemia   . Ankle fracture, right    x 2  . Antineoplastic and immunosuppressive drugs causing adverse effect in therapeutic use   . Asthma   . Chronic venous hypertension without complications   . Colon polyps 2009  . Coronary artery disease    no CAD by cath 11.2010  . Depressive disorder, not elsewhere classified   . Diabetes mellitus type 2 in obese (HCC)    hgb AIC 6.1 09/02/2009, insulin dependent   . Diastolic CHF, chronic (HCC)    reports stable  . Diverticulosis of colon 2009   colonoscopy  . Gout    cortisone  shots helped  . Hepatitis, unspecified    hx of/per pt, never was told of having hepatitis  . Hx of hysterectomy 2002  . Hyperlipidemia   . Hypertension    controlled on medication   . Hypothyroidism    reports her thryoid levels are normal now   . Irritable bowel syndrome   . Lung nodule    21mm RLL nodule on CT Chest  07/2009, resolved by 2011 CT  . Noncompliance   . Obesity   . Persistent atrial fibrillation ([place])    s/p DCCV 07/2009; Pradaxa Rx, states she is in sinus rhythm now   . PONV  (postoperative nausea and vomiting)    after receiving "too much anesthesia" after a hernia surgery   . Sarcoidosis    dx by eye exam; seen during eye exam , report asymtptomatic "i dont have it now'   . Sleep disturbance 06/2013   sleep study, mild abnormality, no criteria for CPAP   Past Surgical History:  Procedure Laterality Date  . ABDOMINAL HYSTERECTOMY    . CARDIOVERSION     x 2  . CARDIOVERSION N/A 02/27/2013   Procedure: CARDIOVERSION;  Surgeon: Lelon Perla, MD;  Location: [facility] ENDOSCOPY;  Service: Cardiovascular;  Laterality: N/A;  . CARDIOVERSION N/A 03/24/2017   Procedure: CARDIOVERSION;  Surgeon: Josue Hector, MD;  Location: [facility] ENDOSCOPY;  Service: Cardiovascular;  Laterality: N/A;  . CARDIOVERSION N/A 03/04/2020   Procedure: CARDIOVERSION;  Surgeon: Josue Hector, MD;  Location: [facility] ENDOSCOPY;  Service: Cardiovascular;  Laterality: N/A;  . CHOLECYSTECTOMY  early 21s  . COLONOSCOPY     Diverticulosis, colon polyps, repeat 2014, Dr. Deatra Ina  . COLONOSCOPY WITH PROPOFOL N/A 07/25/2019   Procedure: COLONOSCOPY WITH PROPOFOL;  Surgeon: Yetta Flock, MD;  Location: [facility] ENDOSCOPY;  Service: Gastroenterology;  Laterality: N/A;  . ESOPHAGOGASTRODUODENOSCOPY N/A 05/16/2014   Procedure: ESOPHAGOGASTRODUODENOSCOPY (EGD);  Surgeon: Wonda Horner, MD;  Location: [facility] ORS;  Service: Gastroenterology;  Laterality: N/A;  . HERNIA REPAIR  [id number]   umbilical hernia  .  MYOMECTOMY  2000  . POLYPECTOMY  07/25/2019   Procedure: POLYPECTOMY;  Surgeon: Yetta Flock, MD;  Location: [facility] ENDOSCOPY;  Service: Gastroenterology;;    Current Outpatient Medications  Medication Sig Dispense Refill  . dronedarone (MULTAQ) 400 MG tablet Take 400 mg by mouth 2 (two) times daily.    . furosemide (LASIX) 40 MG tablet Take 1 tablet (40 mg total) by mouth daily. 180 tablet 3  . ibuprofen (ADVIL) 200 MG tablet Take 400 mg by mouth every 6 (six) hours as needed for headache or mild pain.     .  Insulin Glargine (BASAGLAR KWIKPEN) 100 UNIT/ML Inject 20 Units into the skin daily.    . Insulin Pen Needle (NOVOFINE) 30G X 8 MM MISC For Humalog meal time TID 100 each 11  . JARDIANCE 10 MG TABS tablet Take 10 mg by mouth daily.    . metoprolol succinate (TOPROL XL) 25 MG 24 hr tablet Take 1 tablet (25 mg total) by mouth daily. 90 tablet 3  . NOVOLOG FLEXPEN 100 UNIT/ML FlexPen Inject 5 Units into the skin 3 (three) times daily with meals. 15 mL 0  . PRADAXA 150 MG CAPS capsule TAKE 1 CAPSULE BY MOUTH TWICE DAILY 60 capsule 8  . sacubitril-valsartan (ENTRESTO) 24-26 MG Take 1 tablet by mouth 2 (two) times daily. 60 tablet 3  . simvastatin (ZOCOR) 10 MG tablet Take 10 mg by mouth daily.     Marland Kitchen spironolactone (ALDACTONE) 25 MG tablet Take 0.5 tablets (12.5 mg total) by mouth daily. 45 tablet 3   No current facility-administered medications for this visit.    Allergies  Allergen Reactions  . Amiodarone Other (See Comments)    adverse reaction: Tremor/gait disurbance  . Aspirin Other (See Comments)    "Burns my stomach"  . Methimazole Nausea And Vomiting  . Propoxyphene Nausea And Vomiting  . Albuterol Palpitations  . Atorvastatin Other (See Comments)    Body aches and pains--stiffness.   [name] [Pitavastatin]     Body aches    Social History   Socioeconomic History  . Marital status: Single    Spouse name: Not on file  . Number of children: 5  . Years of education: college  . Highest education level: Not on file  Occupational History  . Occupation: Disabled.  Tobacco Use  . Smoking status: Never Smoker  . Smokeless tobacco: Never Used  Vaping Use  . Vaping Use: Never used  Substance and Sexual Activity  . Alcohol use: Yes    Alcohol/week: 0.0 standard drinks    Comment: rare  . Drug use: No  . Sexual activity: Not Currently    Birth control/protection: Surgical  Other Topics Concern  . Not on file  Social History Narrative   Pt lives in [place] with her son.   Unemployed.  Right handed.     Education college   Social Determinants of Radio broadcast assistant Strain: Not on file  Food Insecurity: Not on file  Transportation Needs: Not on file  Physical Activity: Not on file  Stress: Not on file  Social Connections: Not on file  Intimate Partner Violence: Not on file   Review of Systems: All other systems reviewed and are otherwise negative except as noted above.  Physical Exam: Vitals:   01/03/21 1000  BP: (!) 142/72  Pulse: 60  SpO2: 99%  Weight: 260 lb 3.2 oz (118 kg)  Height: 5\' 3"  (1.6 m)   GEN- The patient is well appearing,  alert and oriented x 3 today.   HEENT: normocephalic, atraumatic; sclera clear, conjunctiva pink; hearing intact; oropharynx clear; neck supple, no JVP Lymph- no cervical lymphadenopathy Lungs- Clear to ausculation bilaterally, normal work of breathing.  No wheezes, rales, rhonchi Heart- Regular rate and rhythm, no murmurs, rubs or gallops, PMI not laterally displaced GI- soft, non-tender, non-distended, bowel sounds present, no hepatosplenomegaly Extremities- no clubbing, cyanosis, or edema; DP/PT/radial pulses 2+ bilaterally MS- no significant deformity or atrophy Skin- warm and dry, no rash or lesion Psych- euthymic mood, full affect Neuro- strength and sensation are intact  EKG is not ordered.   Additional studies reviewed include: EP, AF, and recent admission notes.   Assessment and Plan:  1. Persistent Atrial Fibrillation (ICD10: I48.19) The patient's [id number] is 4, indicating a4.8% annual risk of stroke.  Regular on exam.  Patient will continue on Pradaxa 150 mg BID.  Continue coreg.    2. Secondary Hypercoagulable State (ICD10: D68.69) The patient is at significant risk for stroke/thromboembolism based uponherCHA2DS2-VAScScore of 4. Continue (Pradaxa). Stable.   3. Obesity Body mass index is 46.09 kg/m.  Lifestyle modification was discussed and encouraged including  regular physical activity and weight reduction.  4. NICM Suspected tachycardia mediated. EF decreased to 45-50% on last echo. No signs or symptoms of fluid overload. Hopefully this will improve with maintenance of SR. Consider recheck later this year.  Continue spiro Continue toprol 25 mg daily.  Entresto deferred at last visit with AKI post admission but she has tolerate previously.  Will rechallenge with Entresto 24/26 mg BID. BMET 10 days. If fails Entresto a second time will use Losartan cautiously.   5. HTN Meds as above in setting of treating #4. .   Doing very well as above. RTC to see Dr. [name] 2-3 months. Resuming Entresto with close labs as above.  Shirley Friar, PA-C  01/03/21 11:09 AM

## 2021-01-03 ENCOUNTER — Encounter: Payer: Self-pay | Admitting: Student

## 2021-01-03 ENCOUNTER — Other Ambulatory Visit: Payer: Self-pay

## 2021-01-03 ENCOUNTER — Ambulatory Visit (INDEPENDENT_AMBULATORY_CARE_PROVIDER_SITE_OTHER): Payer: Medicare Other | Admitting: Student

## 2021-01-03 VITALS — BP 142/72 | HR 60 | Ht 63.0 in | Wt 260.2 lb

## 2021-01-03 DIAGNOSIS — I10 Essential (primary) hypertension: Secondary | ICD-10-CM | POA: Diagnosis not present

## 2021-01-03 DIAGNOSIS — I4819 Other persistent atrial fibrillation: Secondary | ICD-10-CM | POA: Diagnosis not present

## 2021-01-03 DIAGNOSIS — I5022 Chronic systolic (congestive) heart failure: Secondary | ICD-10-CM

## 2021-01-03 DIAGNOSIS — Z79899 Other long term (current) drug therapy: Secondary | ICD-10-CM | POA: Diagnosis not present

## 2021-01-03 DIAGNOSIS — I428 Other cardiomyopathies: Secondary | ICD-10-CM | POA: Diagnosis not present

## 2021-01-03 MED ORDER — SACUBITRIL-VALSARTAN 24-26 MG PO TABS: 1.0000 | ORAL_TABLET | Freq: Two times a day (BID) | ORAL | 3 refills | Status: DC

## 2021-01-03 NOTE — Patient Instructions (Signed)
Medication Instructions:  Your physician has recommended you make the following change in your medication:   START: Entresto 24-26mg  twice daily  *If you need a refill on your cardiac medications before your next appointment, please call your pharmacy*   Lab Work: BMET on 01/13/2021  If you have labs (blood work) drawn today and your tests are completely normal, you will receive your results only by: Marland Kitchen MyChart Message (if you have MyChart) OR . A paper copy in the mail If you have any lab test that is abnormal or we need to change your treatment, we will call you to review the results.   Follow-Up: At [facility], you and your health needs are our priority.  As part of our continuing mission to provide you with exceptional heart care, we have created designated Provider Care Teams.  These Care Teams include your primary Cardiologist (physician) and Advanced Practice Providers (APPs -  Physician Assistants and Nurse Practitioners) who all work together to provide you with the care you need, when you need it.  We recommend signing up for the patient portal called "MyChart".  Sign up information is provided on this After Visit Summary.  MyChart is used to connect with patients for Virtual Visits (Telemedicine).  Patients are able to view lab/test results, encounter notes, upcoming appointments, etc.  Non-urgent messages can be sent to your provider as well.   To learn more about what you can do with MyChart, go to [email or web address].    Your next appointment:   As scheduled with Dr [name]

## 2021-01-13 ENCOUNTER — Other Ambulatory Visit: Payer: Self-pay

## 2021-01-13 ENCOUNTER — Other Ambulatory Visit: Payer: Medicare Other | Admitting: *Deleted

## 2021-01-13 DIAGNOSIS — I5022 Chronic systolic (congestive) heart failure: Secondary | ICD-10-CM | POA: Diagnosis not present

## 2021-01-14 ENCOUNTER — Telehealth: Payer: Self-pay

## 2021-01-14 ENCOUNTER — Ambulatory Visit (INDEPENDENT_AMBULATORY_CARE_PROVIDER_SITE_OTHER): Payer: Medicare Other | Admitting: Podiatry

## 2021-01-14 DIAGNOSIS — M79676 Pain in unspecified toe(s): Secondary | ICD-10-CM | POA: Diagnosis not present

## 2021-01-14 DIAGNOSIS — L608 Other nail disorders: Secondary | ICD-10-CM | POA: Diagnosis not present

## 2021-01-14 DIAGNOSIS — B351 Tinea unguium: Secondary | ICD-10-CM

## 2021-01-14 DIAGNOSIS — L603 Nail dystrophy: Secondary | ICD-10-CM

## 2021-01-14 DIAGNOSIS — L814 Other melanin hyperpigmentation: Secondary | ICD-10-CM | POA: Diagnosis not present

## 2021-01-14 LAB — BASIC METABOLIC PANEL
BUN/Creatinine Ratio: 24 (ref 12–28)
BUN: 43 mg/dL — ABNORMAL HIGH (ref 8–27)
CO2: 17 mmol/L — ABNORMAL LOW (ref 20–29)
Calcium: 9.4 mg/dL (ref 8.7–10.3)
Chloride: 100 mmol/L (ref 96–106)
Creatinine, Ser: 1.77 mg/dL — ABNORMAL HIGH (ref 0.57–1.00)
Glucose: 110 mg/dL — ABNORMAL HIGH (ref 65–99)
Potassium: 4.5 mmol/L (ref 3.5–5.2)
Sodium: 141 mmol/L (ref 134–144)
eGFR: 31 mL/min/{1.73_m2} — ABNORMAL LOW (ref >59)

## 2021-01-14 MED ORDER — FLUCONAZOLE 150 MG PO TABS: 150.0000 mg | ORAL_TABLET | ORAL | 1 refills | Status: DC

## 2021-01-14 NOTE — Telephone Encounter (Signed)
Toenail specimen mailed to [facility] for culture

## 2021-01-15 ENCOUNTER — Other Ambulatory Visit: Payer: Self-pay

## 2021-01-15 DIAGNOSIS — I5022 Chronic systolic (congestive) heart failure: Secondary | ICD-10-CM

## 2021-01-21 NOTE — Progress Notes (Signed)
[place] Phone: [phone] Subjective:   Fontaine No, am serving as a scribe for Dr. Hulan Saas. This visit occurred during the SARS-CoV-2 public health emergency.  Safety protocols were in place, including screening questions prior to the visit, additional usage of staff PPE, and extensive cleaning of exam room while observing appropriate contact time as indicated for disinfecting solutions.   I'm seeing this patient by the request  of:  Lilian Coma., MD  CC: knee pain   AOZ:[id number]   06/25/2020 Known severe arthritic changes in the knees bilaterally.  Patient has given steroid injections again.  Patient has had uncontrolled diabetes but can check blood glucose at home.  Encourage patient to do so, half dose of the Kenalog given today to avoid any type of increased patient knows if blood sugars go over 350 she does go to the emergency room immediately.  Due to other comorbidities and social determinants of health including problems with transportation unable to do anything more at this time.  Follow-up with me again in 3 months.  Colchicine given for patients with gout patient given a very short trial of a high-dose ibuprofen but has not held his intake is very intermittently with 1 pill with patient's creatinine of 1.3.  Encouraged her to follow-up with primary care provider to see if anything else can be done for her high blood sugars.  Follow-up again in 3 months.  Total time reviewing patient's chart on the day of the office visit in face-to-face 40 minutes  Update 01/22/2021 Deeana Atwater is a 70 y.o. female coming in with complaint of B knee pain. States that she has pain throughout the day especially with stair climbing.   Patient also notes being in a cast in the 90s on her right leg. States that her skin hung over the cast and that it hasn't been the same since when she compares it to contralateral side.  Patient does not note swelling in this area. Wants to discuss if there is something to do about the excess tissue.        Past Medical History:  Diagnosis Date  . Anemia   . Ankle fracture, right    x 2  . Antineoplastic and immunosuppressive drugs causing adverse effect in therapeutic use   . Asthma   . Chronic venous hypertension without complications   . Colon polyps 2009  . Coronary artery disease    no CAD by cath 11.2010  . Depressive disorder, not elsewhere classified   . Diabetes mellitus type 2 in obese (HCC)    hgb AIC 6.1 09/02/2009, insulin dependent   . Diastolic CHF, chronic (HCC)    reports stable  . Diverticulosis of colon 2009   colonoscopy  . Gout    cortisone  shots helped  . Hepatitis, unspecified    hx of/per pt, never was told of having hepatitis  . Hx of hysterectomy 2002  . Hyperlipidemia   . Hypertension    controlled on medication   . Hypothyroidism    reports her thryoid levels are normal now   . Irritable bowel syndrome   . Lung nodule    62mm RLL nodule on CT Chest  07/2009, resolved by 2011 CT  . Noncompliance   . Obesity   . Persistent atrial fibrillation ([place])    s/p DCCV 07/2009; Pradaxa Rx, states she is in sinus rhythm now   . PONV (postoperative nausea and  vomiting)    after receiving "too much anesthesia" after a hernia surgery   . Sarcoidosis    dx by eye exam; seen during eye exam , report asymtptomatic "i dont have it now'   . Sleep disturbance 06/2013   sleep study, mild abnormality, no criteria for CPAP   Past Surgical History:  Procedure Laterality Date  . ABDOMINAL HYSTERECTOMY    . CARDIOVERSION     x 2  . CARDIOVERSION N/A 02/27/2013   Procedure: CARDIOVERSION;  Surgeon: Lelon Perla, MD;  Location: [facility] ENDOSCOPY;  Service: Cardiovascular;  Laterality: N/A;  . CARDIOVERSION N/A 03/24/2017   Procedure: CARDIOVERSION;  Surgeon: Josue Hector, MD;  Location: [facility] ENDOSCOPY;  Service: Cardiovascular;  Laterality: N/A;   . CARDIOVERSION N/A 03/04/2020   Procedure: CARDIOVERSION;  Surgeon: Josue Hector, MD;  Location: [facility] ENDOSCOPY;  Service: Cardiovascular;  Laterality: N/A;  . CHOLECYSTECTOMY  early 68s  . COLONOSCOPY     Diverticulosis, colon polyps, repeat 2014, Dr. Deatra Ina  . COLONOSCOPY WITH PROPOFOL N/A 07/25/2019   Procedure: COLONOSCOPY WITH PROPOFOL;  Surgeon: Yetta Flock, MD;  Location: [facility] ENDOSCOPY;  Service: Gastroenterology;  Laterality: N/A;  . ESOPHAGOGASTRODUODENOSCOPY N/A 05/16/2014   Procedure: ESOPHAGOGASTRODUODENOSCOPY (EGD);  Surgeon: Wonda Horner, MD;  Location: [facility] ORS;  Service: Gastroenterology;  Laterality: N/A;  . HERNIA REPAIR  4332   umbilical hernia  . MYOMECTOMY  2000  . POLYPECTOMY  07/25/2019   Procedure: POLYPECTOMY;  Surgeon: Yetta Flock, MD;  Location: [facility] ENDOSCOPY;  Service: Gastroenterology;;   Social History   Socioeconomic History  . Marital status: Single    Spouse name: Not on file  . Number of children: 5  . Years of education: college  . Highest education level: Not on file  Occupational History  . Occupation: Disabled.  Tobacco Use  . Smoking status: Never Smoker  . Smokeless tobacco: Never Used  Vaping Use  . Vaping Use: Never used  Substance and Sexual Activity  . Alcohol use: Yes    Alcohol/week: 0.0 standard drinks    Comment: rare  . Drug use: No  . Sexual activity: Not Currently    Birth control/protection: Surgical  Other Topics Concern  . Not on file  Social History Narrative   Pt lives in [place] with her son.  Unemployed.  Right handed.     Education college   Social Determinants of Radio broadcast assistant Strain: Not on file  Food Insecurity: Not on file  Transportation Needs: Not on file  Physical Activity: Not on file  Stress: Not on file  Social Connections: Not on file   Allergies  Allergen Reactions  . Amiodarone Other (See Comments)    adverse reaction: Tremor/gait disurbance  . Aspirin Other  (See Comments)    "Burns my stomach"  . Methimazole Nausea And Vomiting  . Propoxyphene Nausea And Vomiting  . Albuterol Palpitations  . Atorvastatin Other (See Comments)    Body aches and pains--stiffness.   [name] [Pitavastatin]     Body aches   Family History  Problem Relation Age of Onset  . Pneumonia Father        deceased  . Hypertension Father   . Cancer Father   . Multiple sclerosis Mother        hx  . Emphysema Other        runs in the family    Current Outpatient Medications (Endocrine & Metabolic):  Marland Kitchen  Insulin Glargine (BASAGLAR KWIKPEN) 100  UNIT/ML, Inject 20 Units into the skin daily. Marland Kitchen  JARDIANCE 10 MG TABS tablet, Take 10 mg by mouth daily. .  metFORMIN (GLUCOPHAGE) 500 MG tablet,  .  NOVOLOG FLEXPEN 100 UNIT/ML FlexPen, Inject 5 Units into the skin 3 (three) times daily with meals.  Current Outpatient Medications (Cardiovascular):  .  carvedilol (COREG) 25 MG tablet,  .  dronedarone (MULTAQ) 400 MG tablet, Take 400 mg by mouth 2 (two) times daily. .  furosemide (LASIX) 40 MG tablet, Take 1 tablet (40 mg total) by mouth daily. .  metoprolol succinate (TOPROL XL) 25 MG 24 hr tablet, Take 1 tablet (25 mg total) by mouth daily. .  sacubitril-valsartan (ENTRESTO) 24-26 MG, Take 1 tablet by mouth 2 (two) times daily. .  simvastatin (ZOCOR) 10 MG tablet, Take 10 mg by mouth daily.  Marland Kitchen  spironolactone (ALDACTONE) 25 MG tablet, Take 0.5 tablets (12.5 mg total) by mouth daily.   Current Outpatient Medications (Analgesics):  .  ibuprofen (ADVIL) 200 MG tablet, Take 400 mg by mouth every 6 (six) hours as needed for headache or mild pain.   Current Outpatient Medications (Hematological):  .  dabigatran (PRADAXA) 150 MG CAPS capsule,  .  PRADAXA 150 MG CAPS capsule, TAKE 1 CAPSULE BY MOUTH TWICE DAILY  Current Outpatient Medications (Other):  Marland Kitchen  Accu-Chek FastClix Lancets MISC,  .  fluconazole (DIFLUCAN) 150 MG tablet, Take 1 tablet (150 mg total) by mouth once a  week. .  Insulin Pen Needle (NOVOFINE) 30G X 8 MM MISC, For Humalog meal time TID   Reviewed prior external information including notes and imaging from  primary care provider As well as notes that were available from care everywhere and other healthcare systems.  Past medical history, social, surgical and family history all reviewed in electronic medical record.  No pertanent information unless stated regarding to the chief complaint.   Review of Systems:  No headache, visual changes, nausea, vomiting, diarrhea, constipation, dizziness, abdominal pain, skin rash, fevers, chills, night sweats, weight loss, swollen lymph nodes, body aches, joint swelling, chest pain, shortness of breath, mood changes. POSITIVE muscle aches  Objective  Blood pressure 110/80, pulse 69, height 5\' 3"  (1.6 m), weight 260 lb (117.9 kg), SpO2 99 %.   General: No apparent distress alert and oriented x3 mood and affect normal, dressed appropriately.  Overweight HEENT: Pupils equal, extraocular movements intact  Respiratory: Patient's speak in full sentences and does not appear short of breath  Cardiovascular: Trace lower extremity edema, non tender, no erythema  Gait antalgic  MSK: Knee: valgus deformity noted. Large thigh to calf ratio.  Tender to palpation over medial and PF joint line.  ROM full in flexion and extension and lower leg rotation. instability with valgus force.  painful patellar compression. Patellar glide with moderate crepitus. Patellar and quadriceps tendons unremarkable. Hamstring and quadriceps strength is normal.  After informed written and verbal consent, patient was seated on exam table. Right knee was prepped with alcohol swab and utilizing anterolateral approach, patient's right knee space was injected with 4:1  marcaine 0.5%: Kenalog 40mg /dL. Patient tolerated the procedure well without immediate complications.  After informed written and verbal consent, patient was seated on exam  table. Left knee was prepped with alcohol swab and utilizing anterolateral approach, patient's left knee space was injected with 4:1  marcaine 0.5%: Kenalog 40mg /dL. Patient tolerated the procedure well without immediate complications.     Impression and Recommendations:     The above documentation has been reviewed  and is accurate and complete Lyndal Pulley, DO

## 2021-01-22 ENCOUNTER — Other Ambulatory Visit: Payer: Self-pay

## 2021-01-22 ENCOUNTER — Ambulatory Visit (INDEPENDENT_AMBULATORY_CARE_PROVIDER_SITE_OTHER): Payer: Medicare Other | Admitting: Family Medicine

## 2021-01-22 ENCOUNTER — Encounter: Payer: Self-pay | Admitting: Family Medicine

## 2021-01-22 DIAGNOSIS — M170 Bilateral primary osteoarthritis of knee: Secondary | ICD-10-CM | POA: Diagnosis not present

## 2021-01-22 NOTE — Assessment & Plan Note (Signed)
Chronic problem with worsening pain.  Encourage patient to try to avoid anti-inflammatories with patient being on the Pradaxa.  Patient also knows once again about her blood sugars.  Most recent blood draw though 9 days ago showed that patient was well controlled.  Patient will double check her blood sugar twice daily.  If any increase patient will seek medical attention.  Patient though did do very well with these injections previously in September.  Hopefully patient will have another good 6 or more months of improvement.  Patient will can follow-up with me again in 3 months.  Encourage exercises and weight loss.

## 2021-01-22 NOTE — Patient Instructions (Signed)
Good to see you Voltaren gel over the counter Ice 20 mins 2 times a day See me again in 3 months

## 2021-01-24 ENCOUNTER — Other Ambulatory Visit: Payer: Self-pay

## 2021-01-24 ENCOUNTER — Other Ambulatory Visit: Payer: Medicare Other | Admitting: *Deleted

## 2021-01-24 DIAGNOSIS — I5022 Chronic systolic (congestive) heart failure: Secondary | ICD-10-CM | POA: Diagnosis not present

## 2021-01-25 LAB — BASIC METABOLIC PANEL
BUN/Creatinine Ratio: 31 — ABNORMAL HIGH (ref 12–28)
BUN: 62 mg/dL — ABNORMAL HIGH (ref 8–27)
CO2: 16 mmol/L — ABNORMAL LOW (ref 20–29)
Calcium: 10.3 mg/dL (ref 8.7–10.3)
Chloride: 99 mmol/L (ref 96–106)
Creatinine, Ser: 2.03 mg/dL — ABNORMAL HIGH (ref 0.57–1.00)
Glucose: 193 mg/dL — ABNORMAL HIGH (ref 65–99)
Potassium: 4.8 mmol/L (ref 3.5–5.2)
Sodium: 137 mmol/L (ref 134–144)
eGFR: 26 mL/min/{1.73_m2} — ABNORMAL LOW (ref >59)

## 2021-01-28 ENCOUNTER — Other Ambulatory Visit: Payer: Self-pay

## 2021-01-28 DIAGNOSIS — I10 Essential (primary) hypertension: Secondary | ICD-10-CM

## 2021-02-04 DIAGNOSIS — E669 Obesity, unspecified: Secondary | ICD-10-CM | POA: Diagnosis not present

## 2021-02-04 DIAGNOSIS — I10 Essential (primary) hypertension: Secondary | ICD-10-CM | POA: Diagnosis not present

## 2021-02-04 DIAGNOSIS — E1165 Type 2 diabetes mellitus with hyperglycemia: Secondary | ICD-10-CM | POA: Diagnosis not present

## 2021-02-04 DIAGNOSIS — Z7984 Long term (current) use of oral hypoglycemic drugs: Secondary | ICD-10-CM | POA: Diagnosis not present

## 2021-02-13 ENCOUNTER — Other Ambulatory Visit: Payer: Self-pay

## 2021-02-13 MED ORDER — MULTAQ 400 MG PO TABS: 400.0000 mg | ORAL_TABLET | Freq: Two times a day (BID) | ORAL | 11 refills | Status: DC

## 2021-02-20 ENCOUNTER — Ambulatory Visit (INDEPENDENT_AMBULATORY_CARE_PROVIDER_SITE_OTHER): Payer: Medicare Other | Admitting: Pharmacist

## 2021-02-20 ENCOUNTER — Other Ambulatory Visit: Payer: Self-pay

## 2021-02-20 VITALS — BP 136/68 | HR 59

## 2021-02-20 DIAGNOSIS — I5032 Chronic diastolic (congestive) heart failure: Secondary | ICD-10-CM

## 2021-02-20 MED ORDER — SPIRONOLACTONE 25 MG PO TABS: 25.0000 mg | ORAL_TABLET | Freq: Every day | ORAL | Status: DC

## 2021-02-20 NOTE — Progress Notes (Signed)
Patient ID: Stephanie [name]                 DOB: [birth year]                      MRN: [id number]     HPI: Stephanie [name] is a 70 y.o. female of Dr. [name] referred by Oda Kilts, PA to pharmacy clinic for HF medication management. PMH is significant for afib, obesity, NICM, DM and HTN. Most recent LVEF 45-50% on 11/14/20.  She was admitted to [facility] 1/29 for afib w/ RVR, AKI due to dehydration. Stephanie [name] was resumed, but eventually stopped due to rise in scr.   Patient presents today to PharmD clinic. Upon med review, it appears patient is taking medications different than her med list. She is still taking carvedilol once a day along with metoprolol. She is still taking Enresto, but once a day. She is taking spironolactone 25mg  not 12.5mg . States she cant cut the tablet, it just crumbles. She seemed to be confused with all the changes in medications. States she was urinating every 45 min, but this has improved. She feels great. Can walk distances without getting SOB. Does not weigh herself at home. Thinks her scale is in storage. Able to complete all ADL. No swelling. Adheres to low salt diet. Denies any significant dizziness.   Current CHF meds: spironolactone 12.5mg  daily (patient is taking 25mg ), metoprolol succinate 25mg  daily, Jardiance 10mg  daily, furosemide 40mg  daily, Entresto 24/26 once a day (was supposed to stop taking), carvedilol daily(patient was supposed to stop taking) Previously tried:  BP goal: <130/80  Family History:  Family History  Problem Relation Age of Onset  . Pneumonia Father        deceased  . Hypertension Father   . Cancer Father   . Multiple sclerosis Mother        hx  . Emphysema Other        runs in the family     Social History: Never smoked, no alcohol  Diet: does not use salt, not discussed in detail  Exercise: goes up and down steps, walks around the stores and at the park 1-2 times a week  Home BP readings: none available  Wt Readings from  Last 3 Encounters:  01/22/21 260 lb (117.9 kg)  01/03/21 260 lb 3.2 oz (118 kg)  12/05/20 256 lb (116.1 kg)   BP Readings from Last 3 Encounters:  01/22/21 110/80  01/03/21 (!) 142/72  12/05/20 132/84   Pulse Readings from Last 3 Encounters:  01/22/21 69  01/03/21 60  12/05/20 67    Renal function: CrCl cannot be calculated (Patient's most recent lab result is older than the maximum 21 days allowed.).  Past Medical History:  Diagnosis Date  . Anemia   . Ankle fracture, right    x 2  . Antineoplastic and immunosuppressive drugs causing adverse effect in therapeutic use   . Asthma   . Chronic venous hypertension without complications   . Colon polyps 2009  . Coronary artery disease    no CAD by cath 11.2010  . Depressive disorder, not elsewhere classified   . Diabetes mellitus type 2 in obese (HCC)    hgb AIC 6.1 09/02/2009, insulin dependent   . Diastolic CHF, chronic (HCC)    reports stable  . Diverticulosis of colon 2009   colonoscopy  . Gout    cortisone  shots helped  . Hepatitis, unspecified    hx of/per pt, never  was told of having hepatitis  . Hx of hysterectomy 2002  . Hyperlipidemia   . Hypertension    controlled on medication   . Hypothyroidism    reports her thryoid levels are normal now   . Irritable bowel syndrome   . Lung nodule    68mm RLL nodule on CT Chest  07/2009, resolved by 2011 CT  . Noncompliance   . Obesity   . Persistent atrial fibrillation ([place])    s/p DCCV 07/2009; Pradaxa Rx, states she is in sinus rhythm now   . PONV (postoperative nausea and vomiting)    after receiving "too much anesthesia" after a hernia surgery   . Sarcoidosis    dx by eye exam; seen during eye exam , report asymtptomatic "i dont have it now'   . Sleep disturbance 06/2013   sleep study, mild abnormality, no criteria for CPAP    Current Outpatient Medications on File Prior to Visit  Medication Sig Dispense Refill  . Accu-Chek FastClix Lancets MISC     .  carvedilol (COREG) 25 MG tablet     . dabigatran (PRADAXA) 150 MG CAPS capsule     . dronedarone (MULTAQ) 400 MG tablet Take 1 tablet (400 mg total) by mouth 2 (two) times daily. 60 tablet 11  . fluconazole (DIFLUCAN) 150 MG tablet Take 1 tablet (150 mg total) by mouth once a week. 6 tablet 1  . furosemide (LASIX) 40 MG tablet Take 1 tablet (40 mg total) by mouth daily. 180 tablet 3  . ibuprofen (ADVIL) 200 MG tablet Take 400 mg by mouth every 6 (six) hours as needed for headache or mild pain.     . Insulin Glargine (BASAGLAR KWIKPEN) 100 UNIT/ML Inject 20 Units into the skin daily.    . Insulin Pen Needle (NOVOFINE) 30G X 8 MM MISC For Humalog meal time TID 100 each 11  . JARDIANCE 10 MG TABS tablet Take 10 mg by mouth daily.    . metFORMIN (GLUCOPHAGE) 500 MG tablet     . metoprolol succinate (TOPROL XL) 25 MG 24 hr tablet Take 1 tablet (25 mg total) by mouth daily. 90 tablet 3  . NOVOLOG FLEXPEN 100 UNIT/ML FlexPen Inject 5 Units into the skin 3 (three) times daily with meals. 15 mL 0  . PRADAXA 150 MG CAPS capsule TAKE 1 CAPSULE BY MOUTH TWICE DAILY 60 capsule 8  . simvastatin (ZOCOR) 10 MG tablet Take 10 mg by mouth daily.     Marland Kitchen spironolactone (ALDACTONE) 25 MG tablet Take 0.5 tablets (12.5 mg total) by mouth daily. 45 tablet 3   No current facility-administered medications on file prior to visit.    Allergies  Allergen Reactions  . Amiodarone Other (See Comments)    adverse reaction: Tremor/gait disurbance  . Aspirin Other (See Comments)    "Burns my stomach"  . Methimazole Nausea And Vomiting  . Propoxyphene Nausea And Vomiting  . Albuterol Palpitations  . Atorvastatin Other (See Comments)    Body aches and pains--stiffness.   [name] [Pitavastatin]     Body aches     Assessment/Plan:  1. CHF - Blood pressure is above goal of <130/80. Patient is not taking medications as intended. I think there was a lot of confusion when medications were stopped, held and resumed. I have  asked patient to STOP taking carvedilol. She should continue taking metoprolol succinate 25mg  daily, Jardiance 10mg  daily and spironolactone 25mg  daily. Will check a BMP today. I think her scr was  up last time because she was too dry with Entresto, Jardiance, spironolactone and furosemide. I suspect she would do well if Stephanie [name] was continued and furosemide was stopped. After BMP results, will call patient tomorrow and give her very detailed instructions on medication change. She will need follow up in clinic scheduled.   Thank you,   Ramond Dial, Pharm.D, BCPS, CPP [facility]  [address], [place], [place] [address]  Phone: [phone]; Fax: [phone]

## 2021-02-20 NOTE — Patient Instructions (Addendum)
Please STOP taking carvedilol  Continue spironolactone 25mg  daily, metoprolol succinate 25mg  daily and Jardiance 10mg  daily  We will call you tomorrow with your lab results and discuss other medication changes.  Call me at [phone] with any questions

## 2021-02-21 ENCOUNTER — Telehealth: Payer: Self-pay | Admitting: Pharmacist

## 2021-02-21 LAB — BASIC METABOLIC PANEL
BUN/Creatinine Ratio: 24 (ref 12–28)
BUN: 39 mg/dL — ABNORMAL HIGH (ref 8–27)
CO2: 23 mmol/L (ref 20–29)
Calcium: 9.7 mg/dL (ref 8.7–10.3)
Chloride: 101 mmol/L (ref 96–106)
Creatinine, Ser: 1.63 mg/dL — ABNORMAL HIGH (ref 0.57–1.00)
Glucose: 182 mg/dL — ABNORMAL HIGH (ref 65–99)
Potassium: 5.1 mmol/L (ref 3.5–5.2)
Sodium: 138 mmol/L (ref 134–144)
eGFR: 34 mL/min/{1.73_m2} — ABNORMAL LOW (ref >59)

## 2021-02-21 MED ORDER — FUROSEMIDE 20 MG PO TABS: 20.0000 mg | ORAL_TABLET | Freq: Every day | ORAL | 5 refills | Status: DC

## 2021-02-21 NOTE — Telephone Encounter (Signed)
EF 45-50%. Pt not taking medications correctly at visit yesterday - she was taking 2 beta blockers, taking spironolactone 25mg  instead of 12.5mg  (couldn't cut tablets because they would crumble), and taking Entresto once daily (was supposed to stop due to increase in SCr). SCr had likely bumped since pt remained on Lasix 40mg  daily in addition to Entresto, spironolactone, and SGLT2i. SCr has now improved but K is 5.1. Will change Entresto 24-26mg  to 1 tablet twice daily for better benefit, decrease Lasix to 20mg  daily to avoid over-diuresis, and stop spironolactone for now to prevent hyperkalemia with above changes (hopefully can restart at future visit). Will recheck BP and BMET with PharmD in a few weeks. Pt aware to continue on metoprolol and Jardiance. She prefers that we send in new rx for lower dose of furosemide 20mg  daily since she has trouble cutting pills, this has been done. Also advised pt to bring in all meds to follow up visit.

## 2021-02-21 NOTE — Progress Notes (Signed)
  Subjective:  Patient ID: [name], female    DOB: 02-05-1951,  MRN: [id number]  Chief Complaint  Patient presents with  . Nail Problem    Dark thick nail x 1 year. Right great toe nail.     70 y.o. female presents with the above complaint. History confirmed with patient.  States that the right great toe is dark thick and painful and has been for the past year.  Objective:  Physical Exam: warm, good capillary refill, no trophic changes or ulcerative lesions, normal DP and PT pulses and normal sensory exam.  Right hallux nail thickened discoloration darkening Assessment:   1. Nail dystrophy   2. Onychomycosis   3. Pain around toenail    Plan:  Patient was evaluated and treated and all questions answered.  Onychomycosis -Educated on etiology of nail fungus. -Nail sample taken for microbiology and histology. -eRx for oral fluconazole weekly. Educated on risks and benefits of the medication.  Return in about 6 weeks (around 02/25/2021) for Nail Fungus.

## 2021-03-06 ENCOUNTER — Other Ambulatory Visit: Payer: Self-pay | Admitting: Internal Medicine

## 2021-03-11 ENCOUNTER — Ambulatory Visit (INDEPENDENT_AMBULATORY_CARE_PROVIDER_SITE_OTHER): Payer: Medicare Other | Admitting: Pharmacist

## 2021-03-11 ENCOUNTER — Other Ambulatory Visit: Payer: Self-pay

## 2021-03-11 VITALS — BP 120/54 | HR 59

## 2021-03-11 DIAGNOSIS — I5032 Chronic diastolic (congestive) heart failure: Secondary | ICD-10-CM

## 2021-03-11 MED ORDER — BLOOD PRESSURE KIT: 1.0000 | PACK | Freq: Every day | 0 refills | Status: AC

## 2021-03-11 NOTE — Patient Instructions (Addendum)
Continue taking metoprolol succinate 25mg  daily, Jardiance 10mg  daily, furosemide 20mg  daily, Entresto 24/26 twice a day  I will call you tomorrow with your lab results and any changes if needed  DO NOT take carvedilol anymore  Furosemide if your medication for swelling.  Please try to pick up a blood pressure cuff and check your blood pressure once a day and bring the readings with you to your next appointment.

## 2021-03-11 NOTE — Progress Notes (Signed)
Patient ID: Stephanie [name]                 DOB: [birth year]                      MRN: [id number]     HPI: Stephanie [name] is a 70 y.o. female of Dr. [name] referred by Oda Kilts, PA to pharmacy clinic for HF medication management. PMH is significant for afib, obesity, NICM, DM and HTN. Most recent LVEF 45-50% on 11/14/20.  She was admitted to [facility] 1/29 for afib w/ RVR, AKI due to dehydration. [name] was resumed, but eventually stopped due to rise in scr.   At last visit it was discovered that patient was taking both carvedilol and metoprolol. She was still taking Entresto once a day (was supposed to stop taking) and was taking spironolactone 25mg  instead of 12.5mg . Her lab work showed hyperkalemia and a bump in scr. She was asked to stop carvedilol and spironolactone, decrease furosemide to 20mg  daily and start taking Entresto twice a day.  She presents today for follow up. She states that she restarted carvedilol 3 days ago because she had swelling and thought that would help. She states she thinks she put too much salt on cucumber and watermellon. She also took furosemide 40mg  for 1 day. Her swelling improved and is now at baseline. Denies SOB. Is not checking her BP at home because she does not have a cuff. Can walk distances without getting SOB. Does not weigh herself at home. Thinks her scale is in storage. Able to complete all ADL. Denies any significant dizziness.   Current CHF meds: metoprolol succinate 25mg  daily, Jardiance 10mg  daily, furosemide 20mg  daily, Entresto 24/26 twice a day Previously tried:  BP goal: <130/80  Family History:  Family History  Problem Relation Age of Onset  . Pneumonia Father        deceased  . Hypertension Father   . Cancer Father   . Multiple sclerosis Mother        hx  . Emphysema Other        runs in the family     Social History: Never smoked, no alcohol  Diet: does not use salt, not discussed in detail  Exercise: goes up and down  steps, walks around the stores and at the park 1-2 times a week  Home BP readings: none available  Wt Readings from Last 3 Encounters:  01/22/21 260 lb (117.9 kg)  01/03/21 260 lb 3.2 oz (118 kg)  12/05/20 256 lb (116.1 kg)   BP Readings from Last 3 Encounters:  02/20/21 136/68  01/22/21 110/80  01/03/21 (!) 142/72   Pulse Readings from Last 3 Encounters:  02/20/21 (!) 59  01/22/21 69  01/03/21 60    Renal function: CrCl cannot be calculated (Unknown ideal weight.).  Past Medical History:  Diagnosis Date  . Anemia   . Ankle fracture, right    x 2  . Antineoplastic and immunosuppressive drugs causing adverse effect in therapeutic use   . Asthma   . Chronic venous hypertension without complications   . Colon polyps 2009  . Coronary artery disease    no CAD by cath 11.2010  . Depressive disorder, not elsewhere classified   . Diabetes mellitus type 2 in obese (HCC)    hgb AIC 6.1 09/02/2009, insulin dependent   . Diastolic CHF, chronic (HCC)    reports stable  . Diverticulosis of colon 2009   colonoscopy  .  Gout    cortisone  shots helped  . Hepatitis, unspecified    hx of/per pt, never was told of having hepatitis  . Hx of hysterectomy 2002  . Hyperlipidemia   . Hypertension    controlled on medication   . Hypothyroidism    reports her thryoid levels are normal now   . Irritable bowel syndrome   . Lung nodule    28mm RLL nodule on CT Chest  07/2009, resolved by 2011 CT  . Noncompliance   . Obesity   . Persistent atrial fibrillation ([place])    s/p DCCV 07/2009; Pradaxa Rx, states she is in sinus rhythm now   . PONV (postoperative nausea and vomiting)    after receiving "too much anesthesia" after a hernia surgery   . Sarcoidosis    dx by eye exam; seen during eye exam , report asymtptomatic "i dont have it now'   . Sleep disturbance 06/2013   sleep study, mild abnormality, no criteria for CPAP    Current Outpatient Medications on File Prior to Visit   Medication Sig Dispense Refill  . Accu-Chek FastClix Lancets MISC     . dronedarone (MULTAQ) 400 MG tablet Take 1 tablet (400 mg total) by mouth 2 (two) times daily. 60 tablet 11  . furosemide (LASIX) 20 MG tablet Take 1 tablet (20 mg total) by mouth daily. 30 tablet 5  . ibuprofen (ADVIL) 200 MG tablet Take 400 mg by mouth every 6 (six) hours as needed for headache or mild pain.     . Insulin Glargine (BASAGLAR KWIKPEN) 100 UNIT/ML Inject 20 Units into the skin daily.    . Insulin Pen Needle (NOVOFINE) 30G X 8 MM MISC For Humalog meal time TID 100 each 11  . JARDIANCE 10 MG TABS tablet Take 10 mg by mouth daily.    . metoprolol succinate (TOPROL XL) 25 MG 24 hr tablet Take 1 tablet (25 mg total) by mouth daily. 90 tablet 3  . NOVOLOG FLEXPEN 100 UNIT/ML FlexPen Inject 5 Units into the skin 3 (three) times daily with meals. (Patient taking differently: Inject 15 Units into the skin daily before supper.) 15 mL 0  . PRADAXA 150 MG CAPS capsule TAKE 1 CAPSULE BY MOUTH TWICE DAILY 60 capsule 8  . sacubitril-valsartan (ENTRESTO) 24-26 MG Take 1 tablet by mouth 2 (two) times daily.    . simvastatin (ZOCOR) 10 MG tablet Take 10 mg by mouth daily.      No current facility-administered medications on file prior to visit.    Allergies  Allergen Reactions  . Amiodarone Other (See Comments)    adverse reaction: Tremor/gait disurbance  . Aspirin Other (See Comments)    "Burns my stomach"  . Methimazole Nausea And Vomiting  . Propoxyphene Nausea And Vomiting  . Albuterol Palpitations  . Atorvastatin Other (See Comments)    Body aches and pains--stiffness.   [name] [Pitavastatin]     Body aches     Assessment/Plan:  1. CHF - Blood pressure is at goal of <130/80. I explained to patient that carvedilol is not for swelling. Her furosemide is for swelling. She is not to take carvedilol because metoprolol is the same class of medications. Patients verbalized understanding. Continue taking  metoprolol succinate 25mg  daily, Jardiance 10mg  daily, furosemide 20mg  daily and Entresto 24/26 twice a day. Checking a BMP today. I will call patient tomorrow with results and any needed changes. I have sent an Rx to pharmacy to see if they will cover  BP cuff. If not, I encouraged patient to pick one up and check her blood pressure once a day. Follow up in 2 weeks   Thank you,   Ramond Dial, Pharm.D, BCPS, CPP [facility]  [address], [place], [place] [address]  Phone: [phone]; Fax: [phone]

## 2021-03-12 ENCOUNTER — Telehealth: Payer: Self-pay | Admitting: Pharmacist

## 2021-03-12 LAB — BASIC METABOLIC PANEL
BUN/Creatinine Ratio: 21 (ref 12–28)
BUN: 33 mg/dL — ABNORMAL HIGH (ref 8–27)
CO2: 23 mmol/L (ref 20–29)
Calcium: 9.5 mg/dL (ref 8.7–10.3)
Chloride: 103 mmol/L (ref 96–106)
Creatinine, Ser: 1.59 mg/dL — ABNORMAL HIGH (ref 0.57–1.00)
Glucose: 157 mg/dL — ABNORMAL HIGH (ref 65–99)
Potassium: 4.7 mmol/L (ref 3.5–5.2)
Sodium: 139 mmol/L (ref 134–144)
eGFR: 35 mL/min/{1.73_m2} — ABNORMAL LOW (ref >59)

## 2021-03-12 NOTE — Telephone Encounter (Signed)
Called patient and let her know that her labs are stable. Continue metoprolol succinate 25mg  daily, Jardiance 10mg  daily, furosemide 20mg  daily, Entresto 24/26 twice a day. She has not gone to the store yet to get a blood pressure cuff. States she will go tomorrow.

## 2021-03-19 ENCOUNTER — Other Ambulatory Visit: Payer: Self-pay | Admitting: Internal Medicine

## 2021-03-19 NOTE — Telephone Encounter (Signed)
Prescription refill request received for Pradaxa.   LOV: [place], 01/03/2021 Scr: 1.59, 03/11/2021 Weight:117.9 kg  Age: 70 CrCl: 62 ml /min   Pt is on the correct dose of Pradaxa per dosing criteria, prescription refill sent for Pradaxa 150mg  BID.

## 2021-03-28 ENCOUNTER — Ambulatory Visit (INDEPENDENT_AMBULATORY_CARE_PROVIDER_SITE_OTHER): Payer: Medicare Other | Admitting: Pharmacist

## 2021-03-28 ENCOUNTER — Other Ambulatory Visit: Payer: Self-pay

## 2021-03-28 VITALS — BP 138/66 | HR 58 | Wt 268.6 lb

## 2021-03-28 DIAGNOSIS — I5032 Chronic diastolic (congestive) heart failure: Secondary | ICD-10-CM

## 2021-03-28 NOTE — Progress Notes (Signed)
Patient ID: Stephanie [name]                 DOB: Nov 30, 1950                      MRN: [id number]     HPI: Stephanie [name] is a 70 y.o. female of Dr. Caryl [name] referred by Stephanie Kilts, PA to pharmacy clinic for HF medication management. PMH is significant for afib, obesity, NICM, DM and HTN. Most recent LVEF 45-50% on 11/14/20.  She was admitted to [facility] 1/29 for afib w/ RVR, AKI due to dehydration. Stephanie [name] was resumed, but eventually stopped due to rise in scr.   At last previous visit it was discovered that patient was taking both carvedilol and metoprolol. She was still taking Entresto once a day (was supposed to stop taking) and was taking spironolactone 90m instead of 12.515m Her lab work showed hyperkalemia and a bump in scr. She was asked to stop carvedilol and spironolactone, decrease furosemide to 2023maily and start taking Entresto twice a day. Her repeat lab work was stable. At last visit no changes were made. I asked patient to purchase a blood pressure cuff and start checking her BP at home.  Patient presents today for follow up. She denies dizziness, lightheadedness, headache, blurred vision, SOB or swelling. Her weight is up 8lb since April. She does not weight herself at home. No swelling on exam and patient states breathing is good. She has not purchased a blood pressure cuff. Still eating a lot of watermelon and cucumber with salt. States she has been doing a lot more walking.  Current CHF meds: metoprolol succinate 66m21mily, Jardiance 10mg32mly, furosemide 20mg 33my, Entresto 24/26 twice a day Previously tried:  BP goal: <130/80  Family History:  Family History  Problem Relation Age of Onset   Pneumonia Father        deceased   Hypertension Father    Cancer Father    Multiple sclerosis Mother        hx   Emphysema Other        runs in the family    Social History: Never smoked, no alcohol  Diet: does not use salt, not discussed in detail  Exercise: goes up and  down steps, walks around the stores and at the park 1-2 times a week  Home BP readings: none available  Wt Readings from Last 3 Encounters:  01/22/21 260 lb (117.9 kg)  01/03/21 260 lb 3.2 oz (118 kg)  12/05/20 256 lb (116.1 kg)   BP Readings from Last 3 Encounters:  03/11/21 (!) 120/54  02/20/21 136/68  01/22/21 110/80   Pulse Readings from Last 3 Encounters:  03/11/21 (!) 59  02/20/21 (!) 59  01/22/21 69    Renal function: CrCl cannot be calculated (Unknown ideal weight.).  Past Medical History:  Diagnosis Date   Anemia    Ankle fracture, right    x 2   Antineoplastic and immunosuppressive drugs causing adverse effect in therapeutic use    Asthma    Chronic venous hypertension without complications    Colon polyps 2009   Coronary artery disease    no CAD by cath 11.2010   Depressive disorder, not elsewhere classified    Diabetes mellitus type 2 in obese (HCC)    hgb AIC 6.1 09/02/2009, insulin dependent    Diastolic CHF, chronic (HCC)  [place] stable   Diverticulosis of colon 2009   colonoscopy  Gout    cortisone  shots helped   Hepatitis, unspecified    hx of/per pt, never was told of having hepatitis   Hx of hysterectomy 2002   Hyperlipidemia    Hypertension    controlled on medication    Hypothyroidism    reports her thryoid levels are normal now    Irritable bowel syndrome    Lung nodule    75m RLL nodule on CT Chest  07/2009, resolved by 2011 CT   Noncompliance    Obesity    Persistent [place] fibrillation [place]    s/p DCCV 07/2009; Pradaxa Rx, states she is in sinus rhythm now    PONV (postoperative nausea and vomiting)    after receiving "too much anesthesia" after a hernia surgery    Sarcoidosis    dx by eye exam; seen during eye exam , report asymtptomatic "i dont have it now'    Sleep disturbance 06/2013   sleep study, mild abnormality, no criteria for CPAP    Current Outpatient Medications on File Prior to Visit  Medication Sig  Dispense Refill   Accu-Chek FastClix Lancets MISC      Blood Pressure KIT 1 kit by Does not apply route daily. Check blood pressure once a day 1 kit 0   dronedarone (MULTAQ) 400 MG tablet Take 1 tablet (400 mg total) by mouth 2 (two) times daily. 60 tablet 11   furosemide (LASIX) 20 MG tablet Take 1 tablet (20 mg total) by mouth daily. 30 tablet 5   ibuprofen (ADVIL) 200 MG tablet Take 400 mg by mouth every 6 (six) hours as needed for headache or mild pain.      Insulin Glargine (BASAGLAR KWIKPEN) 100 UNIT/ML Inject 20 Units into the skin daily.     Insulin Pen Needle (NOVOFINE) 30G X 8 MM MISC For Humalog meal time TID 100 each 11   JARDIANCE 10 MG TABS tablet Take 10 mg by mouth daily.     metoprolol succinate (TOPROL XL) 25 MG 24 hr tablet Take 1 tablet (25 mg total) by mouth daily. 90 tablet 3   NOVOLOG FLEXPEN 100 UNIT/ML FlexPen Inject 5 Units into the skin 3 (three) times daily with meals. (Patient taking differently: Inject 15 Units into the skin daily before supper.) 15 mL 0   PRADAXA 150 MG CAPS capsule TAKE 1 CAPSULE BY MOUTH TWICE DAILY( EVERY TWELVE HOURS) 60 capsule 5   sacubitril-valsartan (ENTRESTO) 24-26 MG Take 1 tablet by mouth 2 (two) times daily.     simvastatin (ZOCOR) 10 MG tablet Take 10 mg by mouth daily.      No current facility-administered medications on file prior to visit.    Allergies  Allergen Reactions   Amiodarone Other (See Comments)    adverse reaction: Tremor/gait disurbance   Aspirin Other (See Comments)    "Burns my stomach"   Methimazole Nausea And Vomiting   Propoxyphene Nausea And Vomiting   Albuterol Palpitations   Atorvastatin Other (See Comments)    Body aches and pains--stiffness.    Livalo [Pitavastatin]     Body aches     Assessment/Plan:  1. CHF - Blood pressure is above goal of <130/80. At last visit BP was at goal. No other readings to compare. I again asked the patient to get a blood pressure cuff. I cannot give her one from the  patient care fund that we have on site because it would be too small. I asked her if cost was an issue, she  states she just hasn't looked yet. I offered to have one ordered for her but she declined at this time. I have asked her to find her scale in storage and start weighing herself. Her weight is up 8lb from April. She is on lower dose of furosemide, but swelling appears well controlled. Advised patient to watch salt intake, keep fluid intake to no more than 2L per day, weight herself daily and call with a weight gain of >3lb per day or 5lb in a week. No changes in medications today. Continue  metoprolol succinate 64m daily, Jardiance 164mdaily, furosemide 2022maily, Entresto 24/26 twice a day. Recheck BMP today to make sure K is still ok. Not sure if we will be able to increase Entresto or add back on spironolactone. Will need to evaluate labs first. Could consider Bidil in the future if need BP help. She has follow up with Dr. KleCaryl [name] 10 days. We will see pt back after that if needed.   Thank you,   MelRamond Dialharm.D, BCPS, CPP [id number] [id number]: [phone]: [phone]

## 2021-03-28 NOTE — Patient Instructions (Addendum)
Please find your home scale. Please check your weight each morning after using the bathroom. Write down your readings. If you gain >3lb in one day or 5lb in a week please call us  Please pick up a blood pressure cuff and check your blood pressure at home. Please record those readings.  Blood pressure goal is <130/80  You see Dr. [name] on 6/20. We will schedule follow up after you see him if needed.  Keep your fluid intake to no more than 2L per day  Watch the salt!

## 2021-03-29 DIAGNOSIS — Z23 Encounter for immunization: Secondary | ICD-10-CM | POA: Diagnosis not present

## 2021-03-29 LAB — BASIC METABOLIC PANEL
BUN/Creatinine Ratio: 19 (ref 12–28)
BUN: 32 mg/dL — ABNORMAL HIGH (ref 8–27)
CO2: 23 mmol/L (ref 20–29)
Calcium: 9.7 mg/dL (ref 8.7–10.3)
Chloride: 101 mmol/L (ref 96–106)
Creatinine, Ser: 1.66 mg/dL — ABNORMAL HIGH (ref 0.57–1.00)
Glucose: 161 mg/dL — ABNORMAL HIGH (ref 65–99)
Potassium: 4.8 mmol/L (ref 3.5–5.2)
Sodium: 138 mmol/L (ref 134–144)
eGFR: 33 mL/min/{1.73_m2} — ABNORMAL LOW (ref >59)

## 2021-04-07 ENCOUNTER — Ambulatory Visit (INDEPENDENT_AMBULATORY_CARE_PROVIDER_SITE_OTHER): Payer: Medicare Other | Admitting: Internal Medicine

## 2021-04-07 ENCOUNTER — Encounter: Payer: Self-pay | Admitting: Internal Medicine

## 2021-04-07 ENCOUNTER — Other Ambulatory Visit: Payer: Self-pay

## 2021-04-07 VITALS — BP 134/68 | HR 76 | Ht 63.0 in | Wt 275.8 lb

## 2021-04-07 DIAGNOSIS — I4819 Other persistent atrial fibrillation: Secondary | ICD-10-CM | POA: Diagnosis not present

## 2021-04-07 DIAGNOSIS — I428 Other cardiomyopathies: Secondary | ICD-10-CM

## 2021-04-07 NOTE — Progress Notes (Signed)
Patient ID: Stephanie [name], female   DOB: 31-Oct-1950, 70 y.o.   MRN: [id number]      Patient Care Team: Pcp, No as PCP - General Deboraha Sprang, MD as PCP - Electrophysiology (Cardiology)   HPI  Stephanie [name] is a 70 y.o. female Seen in followup for atrial fibrillation in the context of morbid obesity, sarcoid and mild left ventricular hypertrophy.   She underwent cardioversion may/14 with the hopes of restoring sinus rhythm and seeing recovery of LV systolic function. She had rapidly reverted to afib and then spontaneously reverted to sinus rhythm. There has been  interval near normalization of LV systolic function confirmed by echo August 2014   She saw Dr. Greggory Brandy 3/15 to consider catheter ablation.  She was not interested. He reduced her amiodarone to 200 mg a day. Encouraged her to refrain from alcohol and to work on blood pressure and weight reduction.  She had a sleep study 2014 with an AHI of 7.5   She is now being followed by Dr. Buddy Duty and he is working both on her thyroid as well as her diabetes.  Seen 3/21 recurrent afib >> DCCV arrived in sinus rhythm.  Reverted to atrial fibrillation and underwent cardioversion 5/21.  And seen by [facility] 6/21 was back in A. fib.  They discussed dofetilide.  She has not been inclined.  She is on dronaderone  Seen by AT 3/22 with plans to retry Entresto.  Has been seen by [facility]. has been tolerating.  Also now on Jardiance.    Today,the patient denies chest pain,  nocturnal dyspnea, orthopnea or . There have been no palpitations, lightheadedness or syncope.  Complains of LE edema .dyspnea much improved  She have been feeling better since her last visit  She notes she can go up and down the stairs without having exertional shortness of breath  She sleeps with two pillows at night; she denies nocturnal dyspnea  She notes she did replete her sodium intake but noticed LE edema and began to deplete sodium intake    She have increased her  physical activity with walking and moving around a lot more .   DATE TEST EF   2012 [facility]  [place] w/o obs dis  3/12 Echo     25 %   8/14 Echo  55-60 %   12/17 Echo  25-30% LAE (47/2.0/45)  5/19 Echo 15-20%   12/19 Echo  55-65%   8/21 Echo 45-50%   01/22 Echo 40-45%      Date TSH Cr K Hgb  6/17 1.9     1/18 <<0.01     3/18 2.2     6/18 7.4 1.1  12.6  5/19  1.09 4.6 11.1  9/19  1.58 4.9 11.8  11/19  1.42 5.2   5/20 4.02     6/21   1.33 3.7 11.2  6/22  1.66 4.8      Past Medical History:  Diagnosis Date   Anemia    Ankle fracture, right    x 2   Antineoplastic and immunosuppressive drugs causing adverse effect in therapeutic use    Asthma    Chronic venous hypertension without complications    Colon polyps 2009   Coronary artery disease    no CAD by cath 11.2010   Depressive disorder, not elsewhere classified    Diabetes mellitus type 2 in obese (HCC)    hgb AIC 6.1 09/02/2009, insulin dependent    Diastolic CHF, chronic ([place])  reports stable   Diverticulosis of colon 2009   colonoscopy   Gout    cortisone  shots helped   Hepatitis, unspecified    hx of/per pt, never was told of having hepatitis   Hx of hysterectomy 2002   Hyperlipidemia    Hypertension    controlled on medication    Hypothyroidism    reports her thryoid levels are normal now    Irritable bowel syndrome    Lung nodule    41m RLL nodule on CT Chest  07/2009, resolved by 2011 CT   Noncompliance    Obesity    [place]    s/p DCCV 07/2009; Pradaxa Rx, states she is in sinus rhythm now    PONV (postoperative nausea and vomiting)    after receiving "too much anesthesia" after a hernia surgery    Sarcoidosis    dx by eye exam; seen during eye exam , report asymtptomatic "i dont have it now'    Sleep disturbance 06/2013   sleep study, mild abnormality, no criteria for CPAP    Past Surgical History:  Procedure Laterality Date   ABDOMINAL HYSTERECTOMY      CARDIOVERSION     x 2   CARDIOVERSION N/A 02/27/2013   Procedure: CARDIOVERSION;  Surgeon: BLelon Perla MD;  [facility]  Service: Cardiovascular;  Laterality: N/A;   CARDIOVERSION N/A 03/24/2017   Procedure: CARDIOVERSION;  Surgeon: NJosue Hector MD;  [facility]  Service: Cardiovascular;  Laterality: N/A;   CARDIOVERSION N/A 03/04/2020   Procedure: CARDIOVERSION;  Surgeon: NJosue Hector MD;  [facility]  Service: Cardiovascular;  Laterality: N/A;   CHOLECYSTECTOMY  early 80s   COLONOSCOPY     Diverticulosis, colon polyps, repeat 2014, Dr. KDeatra Ina  COLONOSCOPY WITH PROPOFOL N/A 07/25/2019   Procedure: COLONOSCOPY WITH PROPOFOL;  Surgeon: AYetta Flock MD;  Location: [facility];  Service: Gastroenterology;  Laterality: N/A;   ESOPHAGOGASTRODUODENOSCOPY N/A 05/16/2014   Procedure: ESOPHAGOGASTRODUODENOSCOPY (EGD);  Surgeon: SWonda Horner MD;  Location: [facility];  Service: Gastroenterology;  Laterality: N/A;   HERNIA REPAIR  29811  umbilical hernia   MYOMECTOMY  2000   POLYPECTOMY  07/25/2019   Procedure: POLYPECTOMY;  Surgeon: AYetta Flock MD;  Location: [facility];  Service: Gastroenterology;;    Current Outpatient Medications  Medication Sig Dispense Refill   Accu-Chek FastClix Lancets MISC      Blood Pressure KIT 1 kit by Does not apply route daily. Check blood pressure once a day 1 kit 0   dronedarone (MULTAQ) 400 MG tablet Take 1 tablet (400 mg total) by mouth 2 (two) times daily. 60 tablet 11   furosemide (LASIX) 20 MG tablet Take 1 tablet (20 mg total) by mouth daily. 30 tablet 5   ibuprofen (ADVIL) 200 MG tablet Take 400 mg by mouth every 6 (six) hours as needed for headache or mild pain.      Insulin Glargine (BASAGLAR KWIKPEN) 100 UNIT/ML Inject 20 Units into the skin daily.     Insulin Pen Needle (NOVOFINE) 30G X 8 MM MISC For Humalog meal time TID 100 each 11   JARDIANCE 10 MG TABS tablet Take 10 mg by mouth daily.      metoprolol succinate (TOPROL XL) 25 MG 24 hr tablet Take 1 tablet (25 mg total) by mouth daily. 90 tablet 3   NOVOLOG FLEXPEN 100 UNIT/ML FlexPen Inject 5 Units into the skin 3 (three) times daily with meals. (  Patient taking differently: Inject 15 Units into the skin daily before supper.) 15 mL 0   PRADAXA 150 MG CAPS capsule TAKE 1 CAPSULE BY MOUTH TWICE DAILY( EVERY TWELVE HOURS) 60 capsule 5   sacubitril-valsartan (ENTRESTO) 24-26 MG Take 1 tablet by mouth 2 (two) times daily.     simvastatin (ZOCOR) 10 MG tablet Take 10 mg by mouth daily.      No current facility-administered medications for this visit.    Allergies  Allergen Reactions   Amiodarone Other (See Comments)    adverse reaction: Tremor/gait disurbance   Aspirin Other (See Comments)    "Burns my stomach"   Methimazole Nausea And Vomiting   Propoxyphene Nausea And Vomiting   Albuterol Palpitations   Atorvastatin Other (See Comments)    Body aches and pains--stiffness.    Livalo [Pitavastatin]     Body aches    Review of Systems negative except from HPI and PMH  Physical Exam: BP 134/68   Pulse 76   Ht '5\' 3"'  (1.6 m)   Wt 275 lb 12.8 oz (125.1 kg)   SpO2 98%   BMI 48.86 kg/m    Well developed and well nourished in no acute distress HENT normal Neck supple with JVP-flat Lungs Clear Device pocket well healed; without hematoma or erythema.  There is no tethering Regular rate and rhythm, no  gallop No / murmur Abd-soft with active BS No Clubbing cyanosis Trace  edema Skin-warm and dry A & Oriented  Grossly normal sensory and motor function  EKG:    Assessment and  Plan  Hypertension     Atrial fibrillation-persistent    Cardiomyopathy non ischemic resolved intercurrently  Congestive heart failure acute/chronic class 3  Sarcoid  Obesity - morbid   Tremor   Euthyroid   Pt heart failure status is stable.  Increase furosemide 20--40 mg.  Electrolytes are stable.    Cardiomyopathy is stable.   She is tolerating Entresto well we will continue at 24/26 as well as with the SGLT2, Jardiance at 10 mg daily.   She is holding sinus rhythm on dronaderone.  Continue at 400 mg twice daily.  No significant bleeding with Pradaxa 150 twice daily.  Continue.      I,Stephanie Williams,acting as a Education administrator for [name], MD.,have documented all relevant documentation on the behalf of [name], MD,as directed by  [name], MD while in the presence of [name], MD.  I, [name], MD, have reviewed all documentation for this visit. The documentation on 04/07/21 for the exam, diagnosis, procedures, and orders are all accurate and complete.

## 2021-04-07 NOTE — Patient Instructions (Signed)
Medication Instructions:  Your physician recommends that you continue on your current medications as directed. Please refer to the Current Medication list given to you today.  *If you need a refill on your cardiac medications before your next appointment, please call your pharmacy*   Lab Work: None ordered.'  If you have labs (blood work) drawn today and your tests are completely normal, you will receive your results only by: [place] (if you have MyChart) OR A paper copy in the mail If you have any lab test that is abnormal or we need to change your treatment, we will call you to review the results.   Testing/Procedures: None ordered.    Follow-Up: At [facility], you and your health needs are our priority.  As part of our continuing mission to provide you with exceptional heart care, we have created designated Provider Care Teams.  These Care Teams include your primary Cardiologist (physician) and Advanced Practice Providers (APPs -  Physician Assistants and Nurse Practitioners) who all work together to provide you with the care you need, when you need it.  We recommend signing up for the patient portal called "MyChart".  Sign up information is provided on this After Visit Summary.  MyChart is used to connect with patients for Virtual Visits (Telemedicine).  Patients are able to view lab/test results, encounter notes, upcoming appointments, etc.  Non-urgent messages can be sent to your provider as well.   To learn more about what you can do with MyChart, go to [email or web address].    Your next appointment:   4 months  The format for your next appointment:   In Person  Provider:   You will see one of the following Advanced Practice Providers on your designated Care Team:    Legrand Como "[name]" [place], [place]

## 2021-04-11 DIAGNOSIS — R809 Proteinuria, unspecified: Secondary | ICD-10-CM | POA: Diagnosis not present

## 2021-04-15 ENCOUNTER — Telehealth: Payer: Self-pay | Admitting: Internal Medicine

## 2021-04-15 NOTE — Telephone Encounter (Signed)
Called and spoke to patient to inform her of the form process, $29 fee, and authorization we will need to complete the forms.  AO 04/15/21

## 2021-04-16 DIAGNOSIS — Z0279 Encounter for issue of other medical certificate: Secondary | ICD-10-CM

## 2021-04-16 NOTE — Telephone Encounter (Signed)
HIM assisted, Patient came into the office to fill out her portion of the form and authorization and pay $29 fee. The form has been placed in Dr. Olin Pia box to be completed. AO 04/16/21

## 2021-04-18 ENCOUNTER — Other Ambulatory Visit: Payer: Self-pay | Admitting: Internal Medicine

## 2021-04-22 DIAGNOSIS — G4700 Insomnia, unspecified: Secondary | ICD-10-CM | POA: Diagnosis not present

## 2021-04-22 DIAGNOSIS — F32A Depression, unspecified: Secondary | ICD-10-CM | POA: Diagnosis not present

## 2021-04-22 DIAGNOSIS — K068 Other specified disorders of gingiva and edentulous alveolar ridge: Secondary | ICD-10-CM | POA: Diagnosis not present

## 2021-05-06 DIAGNOSIS — F439 Reaction to severe stress, unspecified: Secondary | ICD-10-CM | POA: Diagnosis not present

## 2021-05-06 DIAGNOSIS — G4700 Insomnia, unspecified: Secondary | ICD-10-CM | POA: Diagnosis not present

## 2021-05-08 DIAGNOSIS — E1165 Type 2 diabetes mellitus with hyperglycemia: Secondary | ICD-10-CM | POA: Diagnosis not present

## 2021-05-08 DIAGNOSIS — Z7984 Long term (current) use of oral hypoglycemic drugs: Secondary | ICD-10-CM | POA: Diagnosis not present

## 2021-05-08 DIAGNOSIS — E669 Obesity, unspecified: Secondary | ICD-10-CM | POA: Diagnosis not present

## 2021-05-08 DIAGNOSIS — I10 Essential (primary) hypertension: Secondary | ICD-10-CM | POA: Diagnosis not present

## 2021-05-12 NOTE — Telephone Encounter (Signed)
HIM received the completed form back from the nurse. HIM called and spoke to the patient to inform her that the form was ready for pick up and put in an envelope waiting at check in. The form has also been faxed via Epic as requested. AO 05/12/21

## 2021-07-14 DIAGNOSIS — Z23 Encounter for immunization: Secondary | ICD-10-CM | POA: Diagnosis not present

## 2021-07-18 ENCOUNTER — Other Ambulatory Visit: Payer: Self-pay

## 2021-07-18 ENCOUNTER — Encounter: Payer: Self-pay | Admitting: Student

## 2021-07-18 ENCOUNTER — Ambulatory Visit (INDEPENDENT_AMBULATORY_CARE_PROVIDER_SITE_OTHER): Payer: Medicare Other | Admitting: Student

## 2021-07-18 VITALS — BP 130/66 | HR 61 | Ht 63.0 in | Wt 286.4 lb

## 2021-07-18 DIAGNOSIS — I5022 Chronic systolic (congestive) heart failure: Secondary | ICD-10-CM | POA: Diagnosis not present

## 2021-07-18 DIAGNOSIS — I428 Other cardiomyopathies: Secondary | ICD-10-CM | POA: Diagnosis not present

## 2021-07-18 DIAGNOSIS — I4819 Other persistent atrial fibrillation: Secondary | ICD-10-CM

## 2021-07-18 MED ORDER — METOPROLOL SUCCINATE ER 25 MG PO TB24: 25.0000 mg | ORAL_TABLET | Freq: Every day | ORAL | 3 refills | Status: DC

## 2021-07-18 MED ORDER — ENTRESTO 24-26 MG PO TABS: 1.0000 | ORAL_TABLET | Freq: Two times a day (BID) | ORAL | 3 refills | Status: DC

## 2021-07-18 MED ORDER — MULTAQ 400 MG PO TABS: 400.0000 mg | ORAL_TABLET | Freq: Two times a day (BID) | ORAL | 3 refills | Status: DC

## 2021-07-18 MED ORDER — FUROSEMIDE 40 MG PO TABS: 40.0000 mg | ORAL_TABLET | Freq: Every day | ORAL | 3 refills | Status: DC

## 2021-07-18 NOTE — Progress Notes (Signed)
PCP:  Pcp, No Primary Cardiologist: None Electrophysiologist: Stephanie Axe, Stephanie [name]   Stephanie [name] is a 70 y.o. female seen today for Stephanie Axe, Stephanie [name] for routine electrophysiology followup.  Since last being seen in our clinic the patient reports doing OK. Over the past several weeks she has had more SOB. She has been "sitting around the house eating junk food and drinking juice". She has worse orthopnea, but denies more than her usual SOB with ADLs. she denies chest pain, palpitations, PND, nausea, vomiting, dizziness, syncope, edema, weight gain, or early satiety.  Past Medical History:  Diagnosis Date   Anemia    Ankle fracture, right    x 2   Antineoplastic and immunosuppressive drugs causing adverse effect in therapeutic use    Asthma    Chronic venous hypertension without complications    Colon polyps 2009   Coronary artery disease    no CAD by cath 11.2010   Depressive disorder, not elsewhere classified    Diabetes mellitus type 2 in obese (HCC)    hgb AIC 6.1 09/02/2009, insulin dependent    Diastolic CHF, chronic (HCC)    reports stable   Diverticulosis of colon 2009   colonoscopy   Gout    cortisone  shots helped   Hepatitis, unspecified    hx of/per pt, never was told of having hepatitis   Hx of hysterectomy 2002   Hyperlipidemia    Hypertension    controlled on medication    Hypothyroidism    reports her thryoid levels are normal now    Irritable bowel syndrome    Lung nodule    12m RLL nodule on CT Chest  07/2009, resolved by 2011 CT   Noncompliance    Obesity    Persistent atrial fibrillation (HDare    s/p DCCV 07/2009; Pradaxa Rx, states she is in sinus rhythm now    PONV (postoperative nausea and vomiting)    after receiving "too much anesthesia" after a hernia surgery    Sarcoidosis    dx by eye exam; seen during eye exam , report asymtptomatic "i dont have it now'    Sleep disturbance 06/2013   sleep study, mild abnormality, no criteria for CPAP    Past Surgical History:  Procedure Laterality Date   ABDOMINAL HYSTERECTOMY     CARDIOVERSION     x 2   CARDIOVERSION N/A 02/27/2013   Procedure: CARDIOVERSION;  Surgeon: BLelon Perla Stephanie [name];  [facility]  Service: Cardiovascular;  Laterality: N/A;   CARDIOVERSION N/A 03/24/2017   Procedure: CARDIOVERSION;  Surgeon: NJosue Hector Stephanie [name];  [facility];  Service: Cardiovascular;  Laterality: N/A;   CARDIOVERSION N/A 03/04/2020   Procedure: CARDIOVERSION;  Surgeon: NJosue Hector Stephanie [name];  [facility];  Service: Cardiovascular;  Laterality: N/A;   CHOLECYSTECTOMY  early 80s   COLONOSCOPY     Diverticulosis, colon polyps, repeat 2014, Dr. KDeatra Ina  COLONOSCOPY WITH PROPOFOL N/A 07/25/2019   Procedure: COLONOSCOPY WITH PROPOFOL;  Surgeon: AYetta Flock Stephanie [name];  Location: [facility];  Service: Gastroenterology;  Laterality: N/A;   ESOPHAGOGASTRODUODENOSCOPY N/A 05/16/2014   Procedure: ESOPHAGOGASTRODUODENOSCOPY (EGD);  Surgeon: SWonda Horner Stephanie [name];  Location: [facility];  Service: Gastroenterology;  Laterality: N/A;   HERNIA REPAIR  [id number]  umbilical hernia   MYOMECTOMY  2000   POLYPECTOMY  07/25/2019   Procedure: POLYPECTOMY;  Surgeon: AYetta Flock Stephanie [name];  Location: [facility];  Service: Gastroenterology;;    Current Outpatient Medications  Medication  Sig Dispense Refill   Accu-Chek FastClix Lancets MISC      Blood Pressure KIT 1 kit by Does not apply route daily. Check blood pressure once a day 1 kit 0   dronedarone (MULTAQ) 400 MG tablet Take 1 tablet (400 mg total) by mouth 2 (two) times daily. 60 tablet 11   furosemide (LASIX) 20 MG tablet Take 1 tablet (20 mg total) by mouth daily. 30 tablet 5   ibuprofen (ADVIL) 200 MG tablet Take 400 mg by mouth every 6 (six) hours as needed for headache or mild pain.      Insulin Glargine (BASAGLAR KWIKPEN) 100 UNIT/ML Inject 20 Units into the skin daily.     Insulin Pen Needle (NOVOFINE) 30G X 8 MM MISC For  Humalog meal time TID 100 each 11   JARDIANCE 10 MG TABS tablet Take 10 mg by mouth daily.     metoprolol succinate (TOPROL XL) 25 MG 24 hr tablet Take 1 tablet (25 mg total) by mouth daily. 90 tablet 3   NOVOLOG FLEXPEN 100 UNIT/ML FlexPen Inject 5 Units into the skin 3 (three) times daily with meals. (Patient taking differently: Inject 15 Units into the skin daily before supper.) 15 mL 0   PRADAXA 150 MG CAPS capsule TAKE 1 CAPSULE BY MOUTH TWICE DAILY( EVERY TWELVE HOURS) 60 capsule 5   sacubitril-valsartan (ENTRESTO) 24-26 MG Take 1 tablet by mouth 2 (two) times daily.     simvastatin (ZOCOR) 10 MG tablet Take 10 mg by mouth daily.      No current facility-administered medications for this visit.    Allergies  Allergen Reactions   Amiodarone Other (See Comments)    adverse reaction: Tremor/gait disurbance   Aspirin Other (See Comments)    "Burns my stomach"   Methimazole Nausea And Vomiting   Propoxyphene Nausea And Vomiting   Albuterol Palpitations   Atorvastatin Other (See Comments)    Body aches and pains--stiffness.    Livalo [Pitavastatin]     Body aches    Social History   Socioeconomic History   Marital status: Single    Spouse name: Not on file   Number of children: 5   Years of education: college   Highest education level: Not on file  Occupational History   Occupation: Disabled.  Tobacco Use   Smoking status: Never   Smokeless tobacco: Never  Vaping Use   Vaping Use: Never used  Substance and Sexual Activity   Alcohol use: Yes    Alcohol/week: 0.0 standard drinks    Comment: rare   Drug use: No   Sexual activity: Not Currently    Birth control/protection: Surgical  Other Topics Concern   Not on file  Social History Narrative   Pt lives in [place] with her son.  Unemployed.  Right handed.     Education college   Social Determinants of Radio broadcast assistant Strain: Not on file  Food Insecurity: Not on file  Transportation Needs: Not on  file  Physical Activity: Not on file  Stress: Not on file  Social Connections: Not on file  Intimate Partner Violence: Not on file     Review of Systems: All other systems reviewed and are otherwise negative except as noted above.  Physical Exam: Vitals:   07/18/21 1054  BP: 130/66  Pulse: 61  SpO2: 93%  Weight: 286 lb 6.4 oz (129.9 kg)  Height: '5\' 3"'  (1.6 m)   Wt Readings from Last 3 Encounters:  07/18/21 286 lb  6.4 oz (129.9 kg)  04/07/21 275 lb 12.8 oz (125.1 kg)  03/28/21 268 lb 9.6 oz (121.8 kg)   GEN- The patient is well appearing, alert and oriented x 3 today.   HEENT: normocephalic, atraumatic; sclera clear, conjunctiva pink; hearing intact; oropharynx clear; neck supple, no JVP Lymph- no cervical lymphadenopathy Lungs- Clear to ausculation bilaterally, normal work of breathing.  No wheezes, rales, rhonchi Heart- Regular rate and rhythm, no murmurs, rubs or gallops, PMI not laterally displaced GI- Obese, soft, non-tender, non-distended, bowel sounds present, no hepatosplenomegaly Extremities- no clubbing, cyanosis, or edema; DP/PT/radial pulses 2+ bilaterally MS- no significant deformity or atrophy Skin- warm and dry, no rash or lesion Psych- euthymic mood, full affect Neuro- strength and sensation are intact  EKG is ordered. Personal review of EKG from today shows NSR at 61 bpm, 1st degree block  Additional studies reviewed include: Previous EP office notes.   Assessment and Plan:  1. Persistent Atrial Fibrillation (ICD10:  I48.19) The patient's CHA2DS2-VASc score is 4, indicating a 4.8% annual risk of stroke.   EKG shows NSR with 1st degree AV block. on exam.  Continue multaq 400 mg BID Patient will continue on Pradaxa 150 mg BID.  Continue coreg.     2. Secondary Hypercoagulable State (ICD10:  D68.69) The patient is at significant risk for stroke/thromboembolism based upon her CHA2DS2-VASc Score of 4. Continue (Pradaxa). Stable.    3. Obesity Body  mass index is 50.73 kg/m.  Lifestyle modification was discussed and encouraged including regular physical activity and weight reduction.   4. NICM Suspected tachycardia mediated. EF decreased to 45-50% on last echo. She is volume overloaded on exam in setting of dietary non-compliance.  She is taking lasix 40 mg daily. Take 60 mg daily x 3 days, then monitor fluid intake closely.  Continue spiro Continue toprol 25 mg daily.  Continue [employer] today  5. HTN Stable on current regimen .   RTC 6 months. Sooner with issues.   Shirley Friar, PA-C  07/18/21 11:32 AM

## 2021-07-18 NOTE — Patient Instructions (Addendum)
Medication Instructions:  Your physician has recommended you make the following change in your medication:   INCREASE: Furosemide to 60mg  daily for 3 days then take 40mg  daily  *If you need a refill on your cardiac medications before your next appointment, please call your pharmacy*   Lab Work: TODAY: BMET, BNP  If you have labs (blood work) drawn today and your tests are completely normal, you will receive your results only by: [place] (if you have MyChart) OR A paper copy in the mail If you have any lab test that is abnormal or we need to change your treatment, we will call you to review the results.   Follow-Up: At [facility], you and your health needs are our priority.  As part of our continuing mission to provide you with exceptional heart care, we have created designated Provider Care Teams.  These Care Teams include your primary Cardiologist (physician) and Advanced Practice Providers (APPs -  Physician Assistants and Nurse Practitioners) who all work together to provide you with the care you need, when you need it.  We recommend signing up for the patient portal called "MyChart".  Sign up information is provided on this After Visit Summary.  MyChart is used to connect with patients for Virtual Visits (Telemedicine).  Patients are able to view lab/test results, encounter notes, upcoming appointments, etc.  Non-urgent messages can be sent to your provider as well.   To learn more about what you can do with MyChart, go to [email or web address].    Your next appointment:   6 month(s)  The format for your next appointment:   In Person  Provider:   You may see [name], MD or one of the following Advanced Practice Providers on your designated Care Team:   Tommye Standard, [place] "[facility]" [place], [place]

## 2021-07-19 LAB — PRO B NATRIURETIC PEPTIDE: NT-Pro BNP: 203 pg/mL (ref 0–301)

## 2021-07-19 LAB — BASIC METABOLIC PANEL
BUN/Creatinine Ratio: 18 (ref 12–28)
BUN: 24 mg/dL (ref 8–27)
CO2: 22 mmol/L (ref 20–29)
Calcium: 9.1 mg/dL (ref 8.7–10.3)
Chloride: 103 mmol/L (ref 96–106)
Creatinine, Ser: 1.36 mg/dL — ABNORMAL HIGH (ref 0.57–1.00)
Glucose: 131 mg/dL — ABNORMAL HIGH (ref 70–99)
Potassium: 4.3 mmol/L (ref 3.5–5.2)
Sodium: 142 mmol/L (ref 134–144)
eGFR: 42 mL/min/{1.73_m2} — ABNORMAL LOW (ref >59)

## 2021-08-12 DIAGNOSIS — Z7984 Long term (current) use of oral hypoglycemic drugs: Secondary | ICD-10-CM | POA: Diagnosis not present

## 2021-08-12 DIAGNOSIS — E669 Obesity, unspecified: Secondary | ICD-10-CM | POA: Diagnosis not present

## 2021-08-12 DIAGNOSIS — E119 Type 2 diabetes mellitus without complications: Secondary | ICD-10-CM | POA: Diagnosis not present

## 2021-08-12 DIAGNOSIS — I10 Essential (primary) hypertension: Secondary | ICD-10-CM | POA: Diagnosis not present

## 2021-08-14 ENCOUNTER — Ambulatory Visit (INDEPENDENT_AMBULATORY_CARE_PROVIDER_SITE_OTHER): Payer: Medicare Other | Admitting: Family Medicine

## 2021-08-14 ENCOUNTER — Other Ambulatory Visit: Payer: Self-pay | Admitting: Family Medicine

## 2021-08-14 ENCOUNTER — Other Ambulatory Visit: Payer: Self-pay

## 2021-08-14 VITALS — BP 140/86 | HR 58 | Ht 63.0 in | Wt 276.8 lb

## 2021-08-14 DIAGNOSIS — N1832 Chronic kidney disease, stage 3b: Secondary | ICD-10-CM | POA: Diagnosis not present

## 2021-08-14 DIAGNOSIS — I5032 Chronic diastolic (congestive) heart failure: Secondary | ICD-10-CM | POA: Diagnosis not present

## 2021-08-14 DIAGNOSIS — M10341 Gout due to renal impairment, right hand: Secondary | ICD-10-CM | POA: Diagnosis not present

## 2021-08-14 DIAGNOSIS — M2550 Pain in unspecified joint: Secondary | ICD-10-CM | POA: Diagnosis not present

## 2021-08-14 MED ORDER — COLCHICINE 0.6 MG PO TABS: 0.3000 mg | ORAL_TABLET | Freq: Every day | ORAL | 2 refills | Status: DC | PRN

## 2021-08-14 MED ORDER — ALLOPURINOL 100 MG PO TABS: 50.0000 mg | ORAL_TABLET | Freq: Two times a day (BID) | ORAL | 1 refills | Status: DC

## 2021-08-14 MED ORDER — HYDROCODONE-ACETAMINOPHEN 5-325 MG PO TABS: 1.0000 | ORAL_TABLET | Freq: Four times a day (QID) | ORAL | 0 refills | Status: DC | PRN

## 2021-08-14 NOTE — Patient Instructions (Addendum)
Thank you for coming in today.   Please get labs today before you leave   Check back in 1 week 

## 2021-08-14 NOTE — Progress Notes (Signed)
I, Wendy Poet, LAT, ATC, am serving as scribe for Dr. Lynne Leader.  Stephanie [name] is a 70 y.o. female who presents to [place] at [facility] today for R hand/finger pain, bilat knees, and L foot ongoing and worsening since Friday.  She was last seen by Dr. Tamala Julian on 01/22/21 for B knee pain f/u. Today, pt reports R hand has a "warm" feeling and pain is just starting to move into the L hand.  Swelling: was prior Treatments tried: drinking water, coffee, eating bananas, stopped drinking a lot of pepsi and juices, IBU, 2 old doses of colchicine  [place] notes that she has been taking a lot of ibuprofen multiple doses per day.   Pertinent review of systems: No fevers or chills  Relevant historical information: Heart failure, CKD 3, diabetes.   Exam:  BP 140/86   Pulse (!) 58   Ht 5\' 3"  (1.6 m)   Wt 276 lb 12.8 oz (125.6 kg)   SpO2 98%   BMI 49.03 kg/m  General: Well Developed, well nourished, and in no acute distress.   MSK: Right hand: Swelling and tenderness at second MCP.  Decreased motion.  Knees bilaterally decreased motion and mild effusion.  Antalgic gait.  Left great toe tender palpation MTP.    Lab and Radiology Results    Chemistry      Component Value Date/Time   NA 142 07/18/2021 1204   NA 142 09/15/2016 1441   K 4.3 07/18/2021 1204   K 3.8 09/15/2016 1441   CL 103 07/18/2021 1204   CO2 22 07/18/2021 1204   CO2 28 09/15/2016 1441   BUN 24 07/18/2021 1204   BUN 31.4 (H) 09/15/2016 1441   CREATININE 1.36 (H) 07/18/2021 1204   CREATININE 1.02 (H) 11/18/2016 1440   CREATININE 1.3 (H) 09/15/2016 1441      Component Value Date/Time   CALCIUM 9.1 07/18/2021 1204   CALCIUM 10.2 09/15/2016 1441   ALKPHOS 113 11/16/2020 0124   ALKPHOS 95 09/15/2016 1441   AST 42 (H) 11/16/2020 0124   AST 19 09/15/2016 1441   ALT 50 (H) 11/16/2020 0124   ALT 20 09/15/2016 1441   BILITOT 0.4 11/16/2020 0124   BILITOT 0.4 11/25/2017 1123   BILITOT  0.47 09/15/2016 1441      Lab Results  Component Value Date   HGBA1C 10.3 (H) 11/13/2020    Lab Results  Component Value Date   LABURIC 7.8 (H) 03/01/2018        Assessment and Plan: 70 y.o. female with diffuse arthralgia worsening over the last week thought to be due to gout.  Patient notes her pain is consistent with prior gout flares. She has been managing this pain with ibuprofen which is not ideal with her chronic kidney disease, heart failure, and hypertension.  Ideally would like to treat with allopurinol and colchicine.  However there is a drug interaction between both the Multaq and simvastatin with colchicine. I believe on the whole the risk of drug drug interaction is worth the benefit of colchicine after discussion with patient.  Will adjust colchicine based on renal function and the expected increased serum level of colchicine with Multaq and simvastatin.  We will use 0.3 mg daily.  Additionally will check uric acid and metabolic panel today and check patient back in 1 week.  Will start allopurinol at low-dose of 50 mg/day which is renally adjusted for a GFR of 30-60.  Limited hydrocodone now for pain control.  PDMP reviewed during this encounter. Orders Placed This Encounter  Procedures   Uric acid    Standing Status:   Future    Standing Expiration Date:   93/96/8864   Basic metabolic panel   Meds ordered this encounter  Medications   colchicine 0.6 MG tablet    Sig: Take 0.5 tablets (0.3 mg total) by mouth daily as needed (gout or psuedogout pain).    Dispense:  30 tablet    Refill:  2   allopurinol (ZYLOPRIM) 100 MG tablet    Sig: Take 0.5 tablets (50 mg total) by mouth 2 (two) times daily.    Dispense:  30 tablet    Refill:  1   HYDROcodone-acetaminophen (NORCO/VICODIN) 5-325 MG tablet    Sig: Take 1 tablet by mouth every 6 (six) hours as needed.    Dispense:  10 tablet    Refill:  0     Discussed warning signs or symptoms. Please see  discharge instructions. Patient expresses understanding.   The above documentation has been reviewed and is accurate and complete Lynne Leader, M.D.  Total encounter time 40 minutes including face-to-face time with the patient and, reviewing past medical record, and charting on the date of service.   Significant medical complexity drug interaction review dosing instruction and discussion with patient about medication safety.

## 2021-08-15 LAB — BASIC METABOLIC PANEL
BUN: 25 mg/dL — ABNORMAL HIGH (ref 6–23)
CO2: 26 mEq/L (ref 19–32)
Calcium: 9.6 mg/dL (ref 8.4–10.5)
Chloride: 103 mEq/L (ref 96–112)
Creatinine, Ser: 1.43 mg/dL — ABNORMAL HIGH (ref 0.40–1.20)
GFR: 37.51 mL/min — ABNORMAL LOW (ref >60.00)
Glucose, Bld: 143 mg/dL — ABNORMAL HIGH (ref 70–99)
Potassium: 4.1 mEq/L (ref 3.5–5.1)
Sodium: 137 mEq/L (ref 135–145)

## 2021-08-15 LAB — URIC ACID: Uric Acid, Serum: 7.9 mg/dL — ABNORMAL HIGH (ref 2.4–7.0)

## 2021-08-16 ENCOUNTER — Other Ambulatory Visit: Payer: Self-pay | Admitting: Student

## 2021-08-18 NOTE — Progress Notes (Signed)
Uric acid is elevated at 7.9.  This indicates that your pain is probably due to gout.  Plan to take the colchicine as I prescribed for gout. Kidney function is about the same as it has been.  Please take the allopurinol to lower uric acid to ultimately prevent gout in the future.  Please follow-up as scheduled on November 3.  We will discuss these labs in more detail then.

## 2021-08-20 NOTE — Progress Notes (Signed)
I, Peterson [place], LAT, ATC acting as a scribe for Lynne Leader, MD.  Stephanie [name] is a 70 y.o. female who presents to [place] at [facility] today for f/u R hand/finger pain, bilat knees, and L foot caused by gout due to renal impairment. Pt was last seen by Dr. Georgina Snell on 08/14/21 and was her colchicine dose was adjusted to 0.3mg /daily and she was prescribe allopurinol 50mg /day. Today, pt reports has been taken rx, bilat knee pain continues, but is more of a dull "ache." Pt notes bilat hands and feet are much improved.  She is interested potentially getting knee injections but is anxious about it and generally afraid of needles.  She wants to wait a little bit.  Dx testing: 08/14/21 Labs (uric acid & BMET)  12/01/17 R & L knee XR  08/18/17 R ankle XR  07/18/17 R ankle XR  Pertinent review of systems: No fevers or chills  Relevant historical information: Heart disease.  Atrial fibrillation.  Diabetes.   Exam:  BP 120/82   Pulse 60   Ht 5\' 3"  (1.6 m)   Wt 275 lb (124.7 kg)   SpO2 97%   BMI 48.71 kg/m  General: Well Developed, well nourished, and in no acute distress.   MSK: Right hand normal-appearing nontender normal motion. Knees bilateral normal motion with crepitation.  Mild antalgic gait.    Lab and Radiology Results  X-ray images bilateral knees obtained today personally and independently interpreted.  X-rays compared to bilateral knee x-ray viewer 2019 showing advanced DJD.  Right knee: Severe lateral DJD moderate medial and patellofemoral DJD.  No acute fractures.  Left knee: Severe medial compartment DJD.  Mild lateral and patellofemoral DJD.  Await formal radiology review  Lab Results  Component Value Date   LABURIC 7.9 (H) 08/14/2021      Chemistry      Component Value Date/Time   NA 137 08/14/2021 1552   NA 142 07/18/2021 1204   NA 142 09/15/2016 1441   K 4.1 08/14/2021 1552   K 3.8 09/15/2016 1441   CL 103 08/14/2021 1552    CO2 26 08/14/2021 1552   CO2 28 09/15/2016 1441   BUN 25 (H) 08/14/2021 1552   BUN 24 07/18/2021 1204   BUN 31.4 (H) 09/15/2016 1441   CREATININE 1.43 (H) 08/14/2021 1552   CREATININE 1.02 (H) 11/18/2016 1440   CREATININE 1.3 (H) 09/15/2016 1441      Component Value Date/Time   CALCIUM 9.6 08/14/2021 1552   CALCIUM 10.2 09/15/2016 1441   ALKPHOS 113 11/16/2020 0124   ALKPHOS 95 09/15/2016 1441   AST 42 (H) 11/16/2020 0124   AST 19 09/15/2016 1441   ALT 50 (H) 11/16/2020 0124   ALT 20 09/15/2016 1441   BILITOT 0.4 11/16/2020 0124   BILITOT 0.4 11/25/2017 1123   BILITOT 0.47 09/15/2016 1441      Lab Results  Component Value Date   HGBA1C 10.3 (H) 11/13/2020       Assessment and Plan: 70 y.o. female with hand and toe pain due to gout exacerbation.  Significantly improved with low-dose colchicine.  Colchicine dosed per renal function and potential drug interaction.  Patient tolerating it well with no issues.  Additionally patient was started on allopurinol.  We will plan on checking uric acid in about a month to reassess level.  As for her knee pain she probably will benefit from an steroid injection.  But she is needle phobic.  She like to think  about it and she will let me know.  In addition we need to be concerned about hyperglycemia.  We may only be able to do 1 at a time.  She will let me know when she is ready to consider further treatment for her knees.   PDMP not reviewed this encounter. Orders Placed This Encounter  Procedures   DG Knee AP/LAT W/Sun[place] Left    Standing Status:   Future    Number of Occurrences:   1    Standing Expiration Date:   08/21/2022    Order Specific Question:   Reason for Exam (SYMPTOM  OR DIAGNOSIS REQUIRED)    Answer:   bilateral knee pain    Order Specific Question:   Preferred imaging location?    Answer:   [facility]   DG Knee AP/LAT W/Sun[place] Right    Standing Status:   Future    Number of Occurrences:   1    Standing  Expiration Date:   08/21/2022    Order Specific Question:   Reason for Exam (SYMPTOM  OR DIAGNOSIS REQUIRED)    Answer:   bilateral knee pain    Order Specific Question:   Preferred imaging location?    Answer:   [facility]   Basic metabolic panel   Uric acid    Standing Status:   Future    Standing Expiration Date:   08/21/2022   No orders of the defined types were placed in this encounter.    Discussed warning signs or symptoms. Please see discharge instructions. Patient expresses understanding.   The above documentation has been reviewed and is accurate and complete Lynne Leader, M.D.   Total encounter time 20 minutes including face-to-face time with the patient and, reviewing past medical record, and charting on the date of service.   Treatment plan and options

## 2021-08-21 ENCOUNTER — Other Ambulatory Visit: Payer: Self-pay

## 2021-08-21 ENCOUNTER — Ambulatory Visit (INDEPENDENT_AMBULATORY_CARE_PROVIDER_SITE_OTHER): Payer: Medicare Other

## 2021-08-21 ENCOUNTER — Ambulatory Visit (INDEPENDENT_AMBULATORY_CARE_PROVIDER_SITE_OTHER): Payer: Medicare Other | Admitting: Family Medicine

## 2021-08-21 VITALS — BP 120/82 | HR 60 | Ht 63.0 in | Wt 275.0 lb

## 2021-08-21 DIAGNOSIS — M25562 Pain in left knee: Secondary | ICD-10-CM | POA: Diagnosis not present

## 2021-08-21 DIAGNOSIS — M170 Bilateral primary osteoarthritis of knee: Secondary | ICD-10-CM | POA: Diagnosis not present

## 2021-08-21 DIAGNOSIS — M2550 Pain in unspecified joint: Secondary | ICD-10-CM | POA: Diagnosis not present

## 2021-08-21 DIAGNOSIS — M10341 Gout due to renal impairment, right hand: Secondary | ICD-10-CM

## 2021-08-21 DIAGNOSIS — M25461 Effusion, right knee: Secondary | ICD-10-CM | POA: Diagnosis not present

## 2021-08-21 NOTE — Patient Instructions (Addendum)
Thank you for coming in today.   Please get an Xray today before you leave   Schedule a labs only visit for 1 month so we can recheck your levels.  Let me know when you are ready to consider knee injections in the future.

## 2021-08-22 NOTE — Progress Notes (Signed)
Left knee shows severe arthritis changes

## 2021-08-22 NOTE — Progress Notes (Signed)
Right knee shows severe arthritis changes

## 2021-08-27 ENCOUNTER — Other Ambulatory Visit: Payer: Self-pay

## 2021-08-27 DIAGNOSIS — M10341 Gout due to renal impairment, right hand: Secondary | ICD-10-CM

## 2021-09-08 ENCOUNTER — Other Ambulatory Visit: Payer: Self-pay | Admitting: Family Medicine

## 2021-09-08 NOTE — Telephone Encounter (Signed)
Rx refill request approved per Dr. Corey's orders. 

## 2021-09-09 NOTE — Progress Notes (Signed)
I, Wendy Poet, LAT, ATC, am serving as scribe for Dr. Lynne Leader.  Stephanie [name] is a 70 y.o. female who presents to [place] at [facility] today for f/u of B knee pain and for potential knee injections.  She was last seen by Dr. Georgina Snell on 08/21/21 and had B knee X-rays.  Due to being needle phobic, pt did not want to proceed w/ injections at that time.  Today, pt reports that her B knee pain is about the same and would like to try the injections.  Diagnostic testing: R and L knee XR- 08/21/21  Pertinent review of systems: No fevers or chills  Relevant historical information: Knee DJD.  Diabetes.  Last A1c 6.1 October 25. Needle phobia  Exam:  BP (!) 148/84 (BP Location: Left Arm, Patient Position: Sitting, Cuff Size: Large)   Pulse (!) 58   Ht 5\' 3"  (1.6 m)   Wt 286 lb (129.7 kg)   SpO2 98%   BMI 50.66 kg/m  General: Well Developed, well nourished, and in no acute distress.   MSK:  Bilateral knees minimal effusion normal motion with crepitation.  Tender palpation medial joint line.    Lab and Radiology Results  Procedure: Real-time Ultrasound Guided Injection of knee superior lateral patellar space Device: Philips Affiniti 50G Images permanently stored and available for review in PACS Verbal informed consent obtained.  Discussed risks and benefits of procedure. Warned about infection bleeding damage to structures skin hypopigmentation and fat atrophy among others. Patient expresses understanding and agreement Time-out conducted.   Noted no overlying erythema, induration, or other signs of local infection.   Skin prepped in a sterile fashion.   Local anesthesia: Topical Ethyl chloride.   With sterile technique and under real time ultrasound guidance: 40 mg of Kenalog and 2 mL of Marcaine injected into the knee joint. Fluid seen entering the joint capsule.   Completed without difficulty   Pain immediately resolved suggesting accurate placement of the  medication.   Advised to call if fevers/chills, erythema, induration, drainage, or persistent bleeding.   Images permanently stored and available for review in the ultrasound unit.  Impression: Technically successful ultrasound guided injection.    Procedure: Real-time Ultrasound Guided Injection of left knee superior lateral patellar space Device: Philips Affiniti 50G Images permanently stored and available for review in PACS Verbal informed consent obtained.  Discussed risks and benefits of procedure. Warned about infection bleeding damage to structures skin hypopigmentation and fat atrophy among others. Patient expresses understanding and agreement Time-out conducted.   Noted no overlying erythema, induration, or other signs of local infection.   Skin prepped in a sterile fashion.   Local anesthesia: Topical Ethyl chloride.   With sterile technique and under real time ultrasound guidance: 40 mg of Kenalog and 2 mL of Marcaine injected into knee joint. Fluid seen entering the joint capsule.   Completed without difficulty   Pain immediately resolved suggesting accurate placement of the medication.   Advised to call if fevers/chills, erythema, induration, drainage, or persistent bleeding.   Images permanently stored and available for review in the ultrasound unit.  Impression: Technically successful ultrasound guided injection.   EXAM: RIGHT KNEE 3 VIEWS   COMPARISON:  December 01, 2017   FINDINGS: No evidence of an acute fracture or dislocation. There is marked severity medial and lateral tibiofemoral compartment space narrowing. This is slightly prominent within the lateral tibiofemoral compartment. Moderate severity patellofemoral narrowing is also seen. A small joint effusion is noted.  IMPRESSION: 1. Marked severity tricompartmental degenerative changes. 2. Small joint effusion.     Electronically Signed   By: Virgina Norfolk M.D.   On: 08/22/2021  00:33   EXAM: LEFT KNEE 3 VIEWS   COMPARISON:  December 01, 2017   FINDINGS: No evidence of an acute fracture, dislocation, or joint effusion. Marginal osteophytes are seen with marked severity medial tibiofemoral compartment space narrowing. Stable moderate severity narrowing of the lateral tibiofemoral and patellofemoral compartment spaces is noted. Soft tissues are unremarkable.   IMPRESSION: Predominantly stable marked severity tricompartmental degenerative changes.     Electronically Signed   By: Virgina Norfolk M.D.   On: 08/22/2021 00:37  I, Lynne Leader, personally (independently) visualized and performed the interpretation of the images attached in this note.    Assessment and Plan: 70 y.o. female with bilateral knee pain due to exacerbation of DJD.  Plan for steroid injection bilaterally today.  Recheck back as needed.  Next step if needed could be hyaluronic acid injection. Of note she does have a significant needle phobia but was able to get through today's injections quite well.   PDMP not reviewed this encounter. Orders Placed This Encounter  Procedures   [place] LIMITED JOINT SPACE STRUCTURES LOW BILAT(NO LINKED CHARGES)    Order Specific Question:   Reason for Exam (SYMPTOM  OR DIAGNOSIS REQUIRED)    Answer:   B knee pain    Order Specific Question:   Preferred imaging location?    Answer:   [place]   No orders of the defined types were placed in this encounter.    Discussed warning signs or symptoms. Please see discharge instructions. Patient expresses understanding.   The above documentation has been reviewed and is accurate and complete Lynne Leader, M.D.

## 2021-09-10 ENCOUNTER — Ambulatory Visit: Payer: Self-pay

## 2021-09-10 ENCOUNTER — Other Ambulatory Visit: Payer: Self-pay

## 2021-09-10 ENCOUNTER — Ambulatory Visit (INDEPENDENT_AMBULATORY_CARE_PROVIDER_SITE_OTHER): Payer: Medicare Other | Admitting: Family Medicine

## 2021-09-10 ENCOUNTER — Encounter: Payer: Self-pay | Admitting: Family Medicine

## 2021-09-10 VITALS — BP 148/84 | HR 58 | Ht 63.0 in | Wt 286.0 lb

## 2021-09-10 DIAGNOSIS — M25562 Pain in left knee: Secondary | ICD-10-CM

## 2021-09-10 DIAGNOSIS — G8929 Other chronic pain: Secondary | ICD-10-CM

## 2021-09-10 DIAGNOSIS — M170 Bilateral primary osteoarthritis of knee: Secondary | ICD-10-CM

## 2021-09-10 DIAGNOSIS — M25561 Pain in right knee: Secondary | ICD-10-CM | POA: Diagnosis not present

## 2021-09-10 NOTE — Patient Instructions (Signed)
Good to see you today.  You had B knee injections.  Call or go to the ER if you develop a large red swollen joint with extreme pain or oozing puss.   Follow-up as needed.  We can do these injections as often as every 3 months.

## 2021-11-24 ENCOUNTER — Encounter (HOSPITAL_COMMUNITY): Payer: Self-pay | Admitting: Emergency Medicine

## 2021-11-24 ENCOUNTER — Ambulatory Visit (HOSPITAL_COMMUNITY)
Admission: EM | Admit: 2021-11-24 | Discharge: 2021-11-24 | Disposition: A | Discharge status: Home / Self Care | Payer: Medicare Other | Attending: Family Medicine | Admitting: Family Medicine

## 2021-11-24 ENCOUNTER — Other Ambulatory Visit: Payer: Self-pay

## 2021-11-24 DIAGNOSIS — K5732 Diverticulitis of large intestine without perforation or abscess without bleeding: Secondary | ICD-10-CM | POA: Diagnosis not present

## 2021-11-24 MED ORDER — AMOXICILLIN-POT CLAVULANATE 875-125 MG PO TABS: 1.0000 | ORAL_TABLET | Freq: Two times a day (BID) | ORAL | 0 refills | Status: AC

## 2021-11-24 NOTE — Discharge Instructions (Addendum)
Take amoxicillin-clavulanate 875 mg 1 tab twice daily with food for 7 days.  If your abdominal pain is worsening again or your bloating is worsening again, go to the emergency room

## 2021-11-24 NOTE — ED Triage Notes (Addendum)
Intermittent lower, left abdominal pain .  Has been taking advil for pain.  Pain started last Wednesday, 11/19/2021.  Patient has been taking in liquids.  Last bm was today, reported as "hard" and "watery" at the same time.  Increase in episodes of urinating.    Reports recently took metamucil prior to above symptoms

## 2021-11-24 NOTE — ED Provider Notes (Signed)
[place]    CSN: [id number] Arrival date & time: 11/24/21  1642      History   Chief Complaint Chief Complaint  Patient presents with   Abdominal Pain    HPI Stephanie [name] is a 71 y.o. female.    Abdominal Pain Here for 5 to 6-day history of feeling that her abdomen was bloated and that she was swelling in her legs.  It has improved some for about 3 or 4 days she has had left lower quadrant pain and that has improved taking Advil today.  No nausea or vomiting.  She has had BMs about once a day, is watery.  No blood.  No fever noted  Past medical history includes diabetes for which she sees an endocrinologist.  She also takes Pradaxa for "heart problems" and sees a cardiologist.  She does not have a primary care doctor  She notes some bleeding in her mouth, and states that it keeps her from sleeping sometimes.  She also does not have a dentist  Past Medical History:  Diagnosis Date   Anemia    Ankle fracture, right    x 2   Antineoplastic and immunosuppressive drugs causing adverse effect in therapeutic use    Asthma    Chronic venous hypertension without complications    Colon polyps 2009   Coronary artery disease    no CAD by cath 11.2010   Depressive disorder, not elsewhere classified    Diabetes mellitus type 2 in obese (HCC)    hgb AIC 6.1 09/02/2009, insulin dependent    Diastolic CHF, chronic ([place])    reports stable   Diverticulosis of colon 2009   colonoscopy   Gout    cortisone  shots helped   Hepatitis, unspecified    hx of/per pt, never was told of having hepatitis   Hx of hysterectomy 2002   Hyperlipidemia    Hypertension    controlled on medication    Hypothyroidism    reports her thryoid levels are normal now    Irritable bowel syndrome    Lung nodule    60m RLL nodule on CT Chest  07/2009, resolved by 2011 CT   Noncompliance    Obesity    Persistent atrial fibrillation ([place]    s/p DCCV 07/2009; Pradaxa Rx, states she is in  sinus rhythm now    PONV (postoperative nausea and vomiting)    after receiving "too much anesthesia" after a hernia surgery    Sarcoidosis    dx by eye exam; seen during eye exam , report asymtptomatic "i dont have it now'    Sleep disturbance 06/2013   sleep study, mild abnormality, no criteria for CPAP    Patient Active Problem List   Diagnosis Date Noted   Acute respiratory disease due to COVID-19 virus 11/13/2020   AKI (acute kidney injury) ([facility] 11/13/2020   Hyperosmolar hyperglycemic state (HHS) ([facility] 11/13/2020   Persistent atrial fibrillation ([facility] 09/23/2020   Secondary hypercoagulable state ([facility] 09/04/2020   NICM (nonischemic cardiomyopathy) ([facility] 12/20/2019   History of colonic polyps    Benign neoplasm of ascending colon    Hyperglycemia 06/22/2018   Polyuria 06/22/2018   Polydipsia 06/22/2018   Blurred vision 06/22/2018   Weight loss 06/22/2018   Medication side effect, initial encounter 03/23/2018   Venous stasis dermatitis 03/09/2018   Skin lesion 03/09/2018   History of behavioral and mental health problems 03/02/2018   Degenerative arthritis of knee, bilateral 12/22/2017   Chronic  pain of both knees 11/25/2017   Abdominal pain 06/04/2017   Nausea 06/04/2017   Poor sleep hygiene 06/04/2017   Noncompliance 06/04/2017   Thyroid disease 05/19/2017   Typical atrial flutter (HCC)    Thyrotoxicosis 11/24/2016   Atrial fibrillation with rapid ventricular response ([place]) 11/12/2016   Acute on chronic systolic CHF (congestive heart failure) ([place]) 10/18/2016   Hypokalemia 10/18/2016   Need for prophylactic vaccination against Streptococcus pneumoniae (pneumococcus) 08/20/2016   IDDM (insulin dependent diabetes mellitus) 05/12/2016   Edema 05/12/2016   B12 deficiency 05/06/2016   Normocytic anemia 04/15/2016   Insomnia 01/21/2016   Pelvic pain in female 08/06/2015   Screening for breast cancer 08/06/2015   History of anemia 08/02/2015   Other specified  nutritional anemias 08/02/2015   Urinary pain 08/02/2015   Need for prophylactic vaccination and inoculation against influenza 08/02/2015   Obesity 08/02/2015   Dyslipidemia 08/02/2015   History of sarcoidosis 08/02/2015   Generalized anxiety disorder 08/02/2015   Gout 08/02/2015   Abnormal thyroid blood test 08/02/2015   Physical exam 08/02/2015   Needs flu shot 08/02/2015   Dysthymic disorder 08/02/2015   Ataxia 08/22/2014   Cardiomyopathy, secondary ([place]) 01/28/2011   Chronic diastolic heart failure ([place]) 09/27/2009   Essential hypertension 09/02/2009   Atrial fibrillation ([place]) 08/20/2009   Lung nodule 08/20/2009   CHRONIC VENOUS HYPERTENSION WITHOUT COMPS 11/21/2007    Past Surgical History:  Procedure Laterality Date   ABDOMINAL HYSTERECTOMY     CARDIOVERSION     x 2   CARDIOVERSION N/A 02/27/2013   Procedure: CARDIOVERSION;  Surgeon: Lelon Perla, MD;  Location: [facility];  Service: Cardiovascular;  Laterality: N/A;   CARDIOVERSION N/A 03/24/2017   Procedure: CARDIOVERSION;  Surgeon: Josue Hector, MD;  Location: [facility] ENDOSCOPY;  Service: Cardiovascular;  Laterality: N/A;   CARDIOVERSION N/A 03/04/2020   Procedure: CARDIOVERSION;  Surgeon: Josue Hector, MD;  Location: [facility] ENDOSCOPY;  Service: Cardiovascular;  Laterality: N/A;   CHOLECYSTECTOMY  early 80s   COLONOSCOPY     Diverticulosis, colon polyps, repeat 2014, Dr. Deatra Ina   COLONOSCOPY WITH PROPOFOL N/A 07/25/2019   Procedure: COLONOSCOPY WITH PROPOFOL;  Surgeon: Yetta Flock, MD;  Location: [facility] ENDOSCOPY;  Service: Gastroenterology;  Laterality: N/A;   ESOPHAGOGASTRODUODENOSCOPY N/A 05/16/2014   Procedure: ESOPHAGOGASTRODUODENOSCOPY (EGD);  Surgeon: Wonda Horner, MD;  Location: [facility] ORS;  Service: Gastroenterology;  Laterality: N/A;   HERNIA REPAIR  6256   umbilical hernia   MYOMECTOMY  2000   POLYPECTOMY  07/25/2019   Procedure: POLYPECTOMY;  Surgeon: Yetta Flock, MD;  Location: [facility] ENDOSCOPY;   Service: Gastroenterology;;    OB History   No obstetric history on file.      Home Medications    Prior to Admission medications   Medication Sig Start Date End Date Taking? Authorizing Provider  amoxicillin-clavulanate (AUGMENTIN) 875-125 MG tablet Take 1 tablet by mouth 2 (two) times daily for 7 days. 11/24/21 12/01/21 Yes Barrett Henle, MD  Accu-Chek FastClix Lancets MISC  09/26/18   [provider]  allopurinol (ZYLOPRIM) 100 MG tablet TAKE 1/2 TABLET(50 MG) BY MOUTH TWICE DAILY 09/08/21   Gregor Hams, MD  Blood Pressure KIT 1 kit by Does not apply route daily. Check blood pressure once a day 03/11/21   Deboraha Sprang, MD  dronedarone (MULTAQ) 400 MG tablet Take 1 tablet (400 mg total) by mouth 2 (two) times daily. 07/18/21   Shirley Friar, PA-C  furosemide (LASIX) 40 MG tablet  Take 1 tablet (40 mg total) by mouth daily. 07/18/21   Shirley Friar, PA-C  ibuprofen (ADVIL) 200 MG tablet Take 400 mg by mouth every 6 (six) hours as needed for headache or mild pain.     [provider]  Insulin Glargine (BASAGLAR KWIKPEN) 100 UNIT/ML Inject 20 Units into the skin daily. 11/19/20   [provider]  Insulin Pen Needle (NOVOFINE) 30G X 8 MM MISC For Humalog meal time TID 08/22/18   Tysinger, Camelia Eng, PA-C  JARDIANCE 10 MG TABS tablet Take 10 mg by mouth daily. 12/06/20   [provider]  metoprolol succinate (TOPROL XL) 25 MG 24 hr tablet Take 1 tablet (25 mg total) by mouth daily. 07/18/21   Shirley Friar, PA-C  NOVOLOG FLEXPEN 100 UNIT/ML FlexPen Inject 5 Units into the skin 3 (three) times daily with meals. Patient taking differently: Inject 15 Units into the skin daily before supper. 11/16/20   Donne Hazel, MD  PRADAXA 150 MG CAPS capsule TAKE 1 CAPSULE BY MOUTH TWICE DAILY( EVERY TWELVE HOURS) 03/19/21   Deboraha Sprang, MD  sacubitril-valsartan (ENTRESTO) 24-26 MG Take 1 tablet by mouth 2 (two) times daily. 07/18/21   Shirley Friar, PA-C  simvastatin (ZOCOR) 10 MG tablet Take 10 mg by mouth daily.  12/12/19   [provider]    Family History Family History  Problem Relation Age of Onset   Pneumonia Father        deceased   Hypertension Father    Cancer Father    Multiple sclerosis Mother        hx   Emphysema Other        runs in the family    Social History Social History   Tobacco Use   Smoking status: Never   Smokeless tobacco: Never  Vaping Use   Vaping Use: Never used  Substance Use Topics   Alcohol use: Yes    Alcohol/week: 0.0 standard drinks    Comment: rare   Drug use: No     Allergies   Amiodarone, Aspirin, Methimazole, Propoxyphene, Albuterol, Atorvastatin, and Livalo [pitavastatin]   Review of Systems Review of Systems  Gastrointestinal:  Positive for abdominal pain.    Physical Exam Triage Vital Signs ED Triage Vitals  Enc Vitals Group     BP 11/24/21 1812 124/84     Pulse Rate 11/24/21 1812 86     Resp 11/24/21 1812 18     Temp 11/24/21 1812 98.5 F (36.9 C)     Temp Source 11/24/21 1812 Oral     SpO2 11/24/21 1812 97 %     Weight --      Height --      Head Circumference --      Peak Flow --      Pain Score 11/24/21 1808 5     Pain Loc --      Pain Edu? --      Excl. in [place]? --    No data found.  Updated Vital Signs BP 124/84 (BP Location: Left Arm) Comment (BP Location): large cuff   Pulse 86    Temp 98.5 F (36.9 C) (Oral)    Resp 18    SpO2 97%   Visual Acuity Right Eye Distance:   Left Eye Distance:   Bilateral Distance:    Right Eye Near:   Left Eye Near:    Bilateral Near:     Physical Exam Vitals reviewed.  Constitutional:  General: She is not in acute distress.    Appearance: She is not ill-appearing, toxic-appearing or diaphoretic.  HENT:     Nose: Nose normal.     Mouth/Throat:     Mouth: Mucous membranes are moist.     Pharynx: No oropharyngeal exudate or posterior oropharyngeal erythema.     Comments:  Poor dentition. No active bleeding currently noted Eyes:     Extraocular Movements: Extraocular movements intact.     Conjunctiva/sclera: Conjunctivae normal.     Pupils: Pupils are equal, round, and reactive to light.  Cardiovascular:     Rate and Rhythm: Normal rate and regular rhythm.     Heart sounds: No murmur heard. Pulmonary:     Effort: Pulmonary effort is normal.     Breath sounds: Normal breath sounds.  Abdominal:     Palpations: Abdomen is soft. There is no mass.     Tenderness: There is abdominal tenderness (LLQ).  Musculoskeletal:     Cervical back: Neck supple.     Right lower leg: Edema (trace) present.     Left lower leg: Edema (trace) present.  Lymphadenopathy:     Cervical: No cervical adenopathy.  Skin:    Capillary Refill: Capillary refill takes less than 2 seconds.     Coloration: Skin is not jaundiced or pale.  Neurological:     General: No focal deficit present.     Mental Status: She is alert and oriented to person, place, and time.  Psychiatric:        Behavior: Behavior normal.     UC Treatments / Results  Labs (all labs ordered are listed, but only abnormal results are displayed) Labs Reviewed - No data to display  EKG   Radiology No results found.  Procedures Procedures (including critical care time)  Medications Ordered in UC Medications - No data to display  Initial Impression / Assessment and Plan / UC Course  I have reviewed the triage vital signs and the nursing notes.  Pertinent labs & imaging results that were available during my care of the patient were reviewed by me and considered in my medical decision making (see chart for details).     We will treat for possible diverticulitis.  Information given on low-cost dentistry in the area.  Request made to help her find a PCP. Augmentin since quinolones would interact with her multaq Final Clinical Impressions(s) / UC Diagnoses   Final diagnoses:  Diverticulitis of colon      Discharge Instructions      Take amoxicillin-clavulanate 875 mg 1 tab twice daily with food for 7 days.  If your abdominal pain is worsening again or your bloating is worsening again, go to the emergency room     ED Prescriptions     Medication Sig Dispense Auth. Provider   amoxicillin-clavulanate (AUGMENTIN) 875-125 MG tablet Take 1 tablet by mouth 2 (two) times daily for 7 days. 14 tablet Bilaal Leib, Gwenlyn Perking, MD      PDMP not reviewed this encounter.   Barrett Henle, MD 11/24/21 [phone]

## 2021-12-19 LAB — HM DIABETES EYE EXAM

## 2021-12-24 ENCOUNTER — Other Ambulatory Visit: Payer: Self-pay

## 2021-12-24 ENCOUNTER — Encounter: Payer: Self-pay | Admitting: Internal Medicine

## 2021-12-24 ENCOUNTER — Ambulatory Visit (INDEPENDENT_AMBULATORY_CARE_PROVIDER_SITE_OTHER): Payer: Medicare Other | Admitting: Internal Medicine

## 2021-12-24 VITALS — BP 124/76 | HR 82 | Ht 63.0 in | Wt 289.0 lb

## 2021-12-24 DIAGNOSIS — I4819 Other persistent atrial fibrillation: Secondary | ICD-10-CM | POA: Diagnosis not present

## 2021-12-24 NOTE — Progress Notes (Signed)
Patient ID: Stephanie [name], female   DOB: [birth year], 71 y.o.   MRN: [id number]      Patient Care Team: Pcp, No as PCP - General Deboraha Sprang, MD as PCP - Electrophysiology (Cardiology)   HPI  Stephanie [name] is a 71 y.o. female Seen in followup for atrial fibrillation in the context of morbid obesity, sarcoid and mild left ventricular hypertrophy.   She underwent cardioversion may/14 with the hopes of restoring sinus rhythm and seeing recovery of LV systolic function. She had rapidly reverted to afib and then spontaneously reverted to sinus rhythm. There has been  interval near normalization of LV systolic function confirmed by echo August 2014   She saw Dr. Greggory Brandy 3/15 to consider catheter ablation.  She was not interested. He reduced her amiodarone to 200 mg a day. Encouraged her to refrain from alcohol and to work on blood pressure and weight reduction.  She had a sleep study 2014 with an AHI of 7.5   She is now being followed by Dr. Buddy Duty and he is working both on her thyroid as well as her diabetes.  Seen 3/21 recurrent afib >> DCCV arrived in sinus rhythm.  Reverted to atrial fibrillation and underwent cardioversion 5/21.  And seen by [facility] 6/21 was back in A. fib.  They discussed dofetilide.  She has not been inclined.  She is on dronaderone  Seen by AT 3/22 with plans to retry Entresto.  Has been seen by [facility]. has been tolerating.  Also now on Jardiance.   The patient denies chest pain, nocturnal dyspnea.  There have been no, lightheadedness or syncope.  Complains of dyspnea on exertion and increasing weight gain over the last week or so, she is also had a sensation of anxiety and wonders whether this may be associated with the atrial fibrillation identified today on her electrocardiogram..    DATE TEST EF   2012 [facility]  [place] w/o obs dis  3/12 Echo     25 %   8/14 Echo  55-60 %   12/17 Echo  25-30% LAE (47/2.0/45)  5/19 Echo 15-20%   12/19 Echo  55-65%   8/21 Echo 45-50%    01/22 Echo 40-45%      Date TSH Cr K Hgb  6/17 1.9     1/18 <<0.01     3/18 2.2     6/18 7.4 1.1  12.6  5/19  1.09 4.6 11.1  9/19  1.58 4.9 11.8  11/19  1.42 5.2   5/20 4.02     6/21   1.33 3.7 11.2  6/22  1.66 4.8   10/22  1.43 4.1      Past Medical History:  Diagnosis Date   Anemia    Ankle fracture, right    x 2   Antineoplastic and immunosuppressive drugs causing adverse effect in therapeutic use    Asthma    Chronic venous hypertension without complications    Colon polyps 2009   Coronary artery disease    no CAD by cath 11.2010   Depressive disorder, not elsewhere classified    Diabetes mellitus type 2 in obese ([place])    hgb AIC 6.1 09/02/2009, insulin dependent    Diastolic CHF, chronic ([place])    reports stable   Diverticulosis of colon 2009   colonoscopy   Gout    cortisone  shots helped   Hepatitis, unspecified    hx of/per pt, never was told of having hepatitis   Hx of hysterectomy  2002   Hyperlipidemia    Hypertension    controlled on medication    Hypothyroidism    reports her thryoid levels are normal now    Irritable bowel syndrome    Lung nodule    4m RLL nodule on CT Chest  07/2009, resolved by 2011 CT   Noncompliance    Obesity    [place]    s/p DCCV 07/2009; Pradaxa Rx, states she is in sinus rhythm now    PONV (postoperative nausea and vomiting)    after receiving "too much anesthesia" after a hernia surgery    Sarcoidosis    dx by eye exam; seen during eye exam , report asymtptomatic "i dont have it now'    Sleep disturbance 06/2013   sleep study, mild abnormality, no criteria for CPAP    Past Surgical History:  Procedure Laterality Date   ABDOMINAL HYSTERECTOMY     CARDIOVERSION     x 2   CARDIOVERSION N/A 02/27/2013   Procedure: CARDIOVERSION;  Surgeon: BLelon Perla MD;  [facility]  Service: Cardiovascular;  Laterality: N/A;   CARDIOVERSION N/A 03/24/2017   Procedure: CARDIOVERSION;   Surgeon: NJosue Hector MD;  [facility];  Service: Cardiovascular;  Laterality: N/A;   CARDIOVERSION N/A 03/04/2020   Procedure: CARDIOVERSION;  Surgeon: NJosue Hector MD;  [facility]  Service: Cardiovascular;  Laterality: N/A;   CHOLECYSTECTOMY  early 80s   COLONOSCOPY     Diverticulosis, colon polyps, repeat 2014, Dr. KDeatra Ina  COLONOSCOPY WITH PROPOFOL N/A 07/25/2019   Procedure: COLONOSCOPY WITH PROPOFOL;  Surgeon: AYetta Flock MD;  Location: [facility];  Service: Gastroenterology;  Laterality: N/A;   ESOPHAGOGASTRODUODENOSCOPY N/A 05/16/2014   Procedure: ESOPHAGOGASTRODUODENOSCOPY (EGD);  Surgeon: SWonda Horner MD;  Location: [facility];  Service: Gastroenterology;  Laterality: N/A;   HERNIA REPAIR  [id number]  umbilical hernia   MYOMECTOMY  2000   POLYPECTOMY  07/25/2019   Procedure: POLYPECTOMY;  Surgeon: AYetta Flock MD;  Location: [facility];  Service: Gastroenterology;;    Current Outpatient Medications  Medication Sig Dispense Refill   Accu-Chek FastClix Lancets MISC      allopurinol (ZYLOPRIM) 100 MG tablet TAKE 1/2 TABLET(50 MG) BY MOUTH TWICE DAILY 90 tablet 3   Blood Pressure KIT 1 kit by Does not apply route daily. Check blood pressure once a day 1 kit 0   dronedarone (MULTAQ) 400 MG tablet Take 1 tablet (400 mg total) by mouth 2 (two) times daily. 180 tablet 3   furosemide (LASIX) 40 MG tablet Take 1 tablet (40 mg total) by mouth daily. 90 tablet 3   ibuprofen (ADVIL) 200 MG tablet Take 400 mg by mouth every 6 (six) hours as needed for headache or mild pain.      Insulin Glargine (BASAGLAR KWIKPEN) 100 UNIT/ML Inject 20 Units into the skin daily.     Insulin Pen Needle (NOVOFINE) 30G X 8 MM MISC For Humalog meal time TID 100 each 11   metoprolol succinate (TOPROL XL) 25 MG 24 hr tablet Take 1 tablet (25 mg total) by mouth daily. 90 tablet 3   NOVOLOG FLEXPEN 100 UNIT/ML FlexPen Inject 5 Units into the skin 3 (three) times daily with  meals. (Patient taking differently: Inject 15 Units into the skin daily before supper.) 15 mL 0   PRADAXA 150 MG CAPS capsule TAKE 1 CAPSULE BY MOUTH TWICE DAILY( EVERY TWELVE HOURS) 60 capsule 5   sacubitril-valsartan (ENTRESTO)  24-26 MG Take 1 tablet by mouth 2 (two) times daily. 180 tablet 3   simvastatin (ZOCOR) 10 MG tablet Take 10 mg by mouth daily.      JARDIANCE 25 MG TABS tablet Take 25 mg by mouth daily.     No current facility-administered medications for this visit.    Allergies  Allergen Reactions   Amiodarone Other (See Comments)    adverse reaction: Tremor/gait disurbance   Aspirin Other (See Comments)    "Burns my stomach"   Methimazole Nausea And Vomiting   Propoxyphene Nausea And Vomiting   Albuterol Palpitations   Atorvastatin Other (See Comments)    Body aches and pains--stiffness.    Livalo [Pitavastatin]     Body aches    Review of Systems negative except from HPI and PMH  Physical Exam: BP 124/76    Pulse 82    Ht _0  (1.6 m)    Wt 289 lb (131.1 kg)    SpO2 97%    BMI 51.19 kg/m    Well developed and Morbidly obese in no acute distress HENT normal Neck supple with JVP-flat Clear Regular rate and rhythm, no murmur Abd-soft with active BS No Clubbing cyanosis 1+ edema Skin-warm and dry A & Oriented  Grossly normal sensory and motor function  ECG atrial fibrillation at 82 Interval-/11/42 Nonspecific T wave changes      Assessment and  Plan  Hypertension     Atrial fibrillation-persistent-recurrent increasing-  Cardiomyopathy non ischemic resolved intercurrently  Congestive heart failure acute/chronic class III  Sarcoid  Obesity - morbid   Tremor   Euthyroid  Acute on chronic heart failure possibly related to the onset of atrial fibrillation which she may have recognized with a sensation of nervousness about a week ago.  ECG confirms atrial fibrillation.  We will submit her for cardioversion.  She has not missed any of her  dabigatran.  She is volume overloaded.  We will increase her furosemide from 40 daily to 80 daily x5 days.  We will plan to set her up for a follow-up in the A-fib clinic so as to establish a strategy of ongoing care.  We will need to also think about how it is that we identify persistence of atrial fibrillation given the data from [place] which was associate with increased mortality in patients with persistent atrial fibrillation.  Blood pressure is well controlled.  We will continue her on Entresto 24/26 she will stay on Jardiance as well.  And metoprolol 25   Previously on spirolactone discontinued because of hyperkalemia and increased creatinine

## 2021-12-24 NOTE — Patient Instructions (Addendum)
Medication Instructions:  ?Your physician has recommended you make the following change in your medication:  ?INCREASE Lasix to 80 mg for 5 days, then return to normal dosing of 40 mg daily ? ?*If you need a refill on your cardiac medications before your next appointment, please call your pharmacy* ? ? ?Lab Work: ?None ordered ?If you have labs (blood work) drawn today and your tests are completely normal, you will receive your results only by: ?MyChart Message (if you have MyChart) OR ?A paper copy in the mail ?If you have any lab test that is abnormal or we need to change your treatment, we will call you to review the results. ? ? ?Testing/Procedures: ?Your physician has recommended that you have a Cardioversion (DCCV). Electrical Cardioversion uses a jolt of electricity to your heart either through paddles or wired patches attached to your chest. This is a controlled, usually prescheduled, procedure. Defibrillation is done under light anesthesia in the hospital, and you usually go home the day of the procedure. This is done to get your heart back into a normal rhythm. You are not awake for the procedure. ? The nurse will you to schedule this  ? ? ?Follow-Up: ?At [facility], you and your health needs are our priority.  As part of our continuing mission to provide you with exceptional heart care, we have created designated Provider Care Teams.  These Care Teams include your primary Cardiologist (physician) and Advanced Practice Providers (APPs -  Physician Assistants and Nurse Practitioners) who all work together to provide you with the care you need, when you need it. ? ?We recommend signing up for the patient portal called "MyChart".  Sign up information is provided on this After Visit Summary.  MyChart is used to connect with patients for Virtual Visits (Telemedicine).  Patients are able to view lab/test results, encounter notes, upcoming appointments, etc.  Non-urgent messages can be sent to your provider  as well.   ?To learn more about what you can do with MyChart, go to [email or web address].   ? ?Your next appointment:   ?4 week(s) after your cardioversion ? ?The format for your next appointment:   ?In Person ? ?Provider:   ?You will follow up in the [facility] located at [facility]. ?Your provider will be: ?Roderic Palau, NP or Clint R. Fenton, PA-C ? ? ? ?Thank you for choosing [facility]!! ? ? ? ? ?Other Instructions ? ?You are scheduled for a Cardioversion on 12/30/2021 with Dr. Saunders Revel.  Please arrive at the [facility] at [address] am. [place], [place] [address]. ? ?DIET: Nothing to eat or drink after midnight except a sip of water with medications (see medication instructions below) ? ?FYI: For your safety, and to allow [place] to monitor your vital signs accurately during the surgery/procedure we request that   ?if you have artificial nails, gel coating, SNS etc. Please have those removed prior to your surgery/procedure. Not having the nail coverings /polish removed may result in cancellation or delay of your surgery/procedure. ? ? ?Medication Instructions: Do not take Furosemide, Jardiance and any morning insulin tomorrow morning prior to procedure.   ? ?Continue your anticoagulant: Pradaxa ? ?You will need to continue your anticoagulant after your procedure until you  are told by your Provider that it is safe to stop. ? ? ?You must have a responsible person to drive you home and stay in the waiting area during your procedure. Failure to do so could  result in cancellation. ? ?Interior and spatial designer cards. ? ?*Special Note: Every effort is made to have your procedure done on time. Occasionally there are emergencies that occur at the hospital that may cause delays. Please be patient if a delay does occur.   ?

## 2021-12-25 ENCOUNTER — Telehealth: Payer: Self-pay | Admitting: Internal Medicine

## 2021-12-25 NOTE — Telephone Encounter (Signed)
Spoke with pt who states she had to leave yesterday prior to receiving her AVS.  Pt is asking if she should continue her Multaq as Dr [name] made a reference to the medication not "being good" when someone is Afib.  Pt advised Dr [name] has referenced in his note that she is on Multaq and there are no instructions to stop the medication; therefore, pt should continue medication unless she is advised to stop.  She states she is also having a flare of gout in her ankle.  Recommended she contact her provider that manages her gout condition.  Pt also with questions re: Cardioversion scheduled at [facility].  Pt advised the RN who worked with Dr [name] 12/24/2021 will contact her with complete instructions.  Pt verbalizes understanding and agrees with current plan.  ?

## 2021-12-25 NOTE — Telephone Encounter (Signed)
Patient called stating she needs to speak to nurse about some medication.  Also about going to [facility] about her procedure.  ?

## 2021-12-29 ENCOUNTER — Encounter: Payer: Self-pay | Admitting: Anesthesiology

## 2021-12-29 ENCOUNTER — Other Ambulatory Visit: Payer: Self-pay | Admitting: Internal Medicine

## 2021-12-29 ENCOUNTER — Telehealth: Payer: Self-pay | Admitting: Internal Medicine

## 2021-12-29 MED ORDER — ACETAMINOPHEN 325 MG PO TABS: 650.0000 mg | ORAL_TABLET | Freq: Once | ORAL | Status: DC | PRN

## 2021-12-29 MED ORDER — SODIUM CHLORIDE 0.9 % IV SOLN: INTRAVENOUS | Status: DC

## 2021-12-29 MED ORDER — ACETAMINOPHEN 160 MG/5ML PO SOLN
325.0000 mg | ORAL | Status: DC | PRN
  Filled 2021-12-29: qty 20.3

## 2021-12-29 NOTE — Anesthesia Preprocedure Evaluation (Signed)
Anesthesia Evaluation  ? ? ?History of Anesthesia Complications ?(+) PONV and history of anesthetic complications ? ?Airway ? ? ? ? ? ? ? Dental ?  ?Pulmonary ?asthma ,  ?Sarcoidosis  ?  ? ? ? ? ? ? ? Cardiovascular ?hypertension, + CAD and +CHF  ?+ dysrhythmias (a fib)  ? ? ?  ?Neuro/Psych ?PSYCHIATRIC DISORDERS Anxiety Depression   ? GI/Hepatic ?  ?Endo/Other  ?diabetes, Type 2, Insulin DependentHypothyroidism Class 3 obesity ? Renal/GU ?  ? ?  ?Musculoskeletal ? ?(+) Arthritis ,  ? Abdominal ?  ?Peds ? Hematology ? ?(+) Blood dyscrasia, anemia ,   ?Anesthesia Other Findings ? ? Reproductive/Obstetrics ? ?  ? ? ? ? ? ? ? ? ? ? ? ? ? ?  ?  ? ? ? ? ? ? ? ? ?Anesthesia Physical ?Anesthesia Plan ? ?ASA: 3 ? ?Anesthesia Plan: General  ? ?Post-op Pain Management:   ? ?Induction: Intravenous ? ?PONV Risk Score and Plan: 4 or greater and Propofol infusion, TIVA and Treatment may vary due to age or medical condition ? ?Airway Management Planned: Natural Airway ? ?Additional Equipment:  ? ?Intra-op Plan:  ? ?Post-operative Plan:  ? ?Informed Consent: I have reviewed the patients History and Physical, chart, labs and discussed the procedure including the risks, benefits and alternatives for the proposed anesthesia with the patient or authorized representative who has indicated his/her understanding and acceptance.  ? ? ? ? ? ?Plan Discussed with: CRNA ? ?Anesthesia Plan Comments: (LMA/GETA backup discussed.  Patient consented for risks of anesthesia including but not limited to:  ?- adverse reactions to medications ?- damage to eyes, teeth, lips or other oral mucosa ?- nerve damage due to positioning  ?- sore throat or hoarseness ?- damage to heart, brain, nerves, lungs, other parts of body or loss of life ? ?Informed patient about role of CRNA in peri- and intra-operative care.  Patient voiced understanding.)  ? ? ? ? ? ? ?Anesthesia Quick Evaluation ? ?

## 2021-12-29 NOTE — Telephone Encounter (Incomplete)
Patient calling in bout procedure on tomorrow  ?

## 2021-12-29 NOTE — Telephone Encounter (Signed)
Spoke with pt who states she has not received instructions for her Cardioversion scheduled at [facility] tomorrow am.  Pt does not use MyChart.  Instructions reviewed with pt.  Pt verbalizes understanding and agrees with current plan. ? ?You are scheduled for a Cardioversion on Tuesday, 12/30/2021 with Dr. Saunders Revel.  Please arrive at the [facility] at [address]. [place], [place] [address]. ?  ?DIET: Nothing to eat or drink after midnight except a sip of water with medications (see medication instructions below) ?  ?FYI: For your safety, and to allow [place] to monitor your vital signs accurately during the surgery/procedure we request that   ?if you have artificial nails, gel coating, SNS etc. Please have those removed prior to your surgery/procedure. Not having the nail coverings /polish removed may result in cancellation or delay of your surgery/procedure. ?  ?  ?Medication Instructions: Do not take Furosemide, Jardiance and any morning insulin tomorrow morning prior to procedure.   ?  ?Continue your anticoagulant: Pradaxa ?  ?You will need to continue your anticoagulant after your procedure until you are told by your Provider that it is safe to stop. ?  ?  ?You must have a responsible person to drive you home and stay in the waiting area during your procedure. Failure to do so could result in cancellation. ?  ?Interior and spatial designer cards. ?  ?*Special Note: Every effort is made to have your procedure done on time. Occasionally there are emergencies that occur at the hospital that may cause delays. Please be patient if a delay does occur.   ?

## 2021-12-30 ENCOUNTER — Ambulatory Visit
Admission: RE | Admit: 2021-12-30 | Discharge: 2021-12-30 | Disposition: A | Discharge status: Home / Self Care | Payer: Medicare Other | Attending: Internal Medicine | Admitting: Internal Medicine

## 2021-12-30 ENCOUNTER — Encounter
Admission: RE | Disposition: A | Discharge status: Home / Self Care | Payer: Self-pay | Source: Home / Self Care | Attending: Internal Medicine

## 2021-12-30 DIAGNOSIS — I4819 Other persistent atrial fibrillation: Secondary | ICD-10-CM

## 2021-12-30 DIAGNOSIS — I4891 Unspecified atrial fibrillation: Secondary | ICD-10-CM | POA: Insufficient documentation

## 2021-12-30 DIAGNOSIS — Z539 Procedure and treatment not carried out, unspecified reason: Secondary | ICD-10-CM | POA: Diagnosis present

## 2021-12-30 SURGERY — CARDIOVERSION
Anesthesia: General

## 2021-12-30 MED ORDER — ONDANSETRON HCL 4 MG/2ML IJ SOLN: 4.0000 mg | Freq: Once | INTRAMUSCULAR | Status: DC | PRN

## 2022-01-26 ENCOUNTER — Emergency Department (HOSPITAL_COMMUNITY): Payer: Medicare Other

## 2022-01-26 ENCOUNTER — Encounter (HOSPITAL_COMMUNITY): Payer: Self-pay

## 2022-01-26 ENCOUNTER — Emergency Department (HOSPITAL_COMMUNITY)
Admission: EM | Admit: 2022-01-26 | Discharge: 2022-01-26 | Disposition: A | Discharge status: Home / Self Care | Payer: Medicare Other | Attending: Emergency Medicine | Admitting: Emergency Medicine

## 2022-01-26 DIAGNOSIS — E119 Type 2 diabetes mellitus without complications: Secondary | ICD-10-CM | POA: Diagnosis not present

## 2022-01-26 DIAGNOSIS — I110 Hypertensive heart disease with heart failure: Secondary | ICD-10-CM | POA: Diagnosis not present

## 2022-01-26 DIAGNOSIS — K5792 Diverticulitis of intestine, part unspecified, without perforation or abscess without bleeding: Secondary | ICD-10-CM | POA: Insufficient documentation

## 2022-01-26 DIAGNOSIS — I2510 Atherosclerotic heart disease of native coronary artery without angina pectoris: Secondary | ICD-10-CM | POA: Diagnosis not present

## 2022-01-26 DIAGNOSIS — I4891 Unspecified atrial fibrillation: Secondary | ICD-10-CM | POA: Insufficient documentation

## 2022-01-26 DIAGNOSIS — Z794 Long term (current) use of insulin: Secondary | ICD-10-CM | POA: Insufficient documentation

## 2022-01-26 DIAGNOSIS — I509 Heart failure, unspecified: Secondary | ICD-10-CM | POA: Insufficient documentation

## 2022-01-26 DIAGNOSIS — Z79899 Other long term (current) drug therapy: Secondary | ICD-10-CM | POA: Diagnosis not present

## 2022-01-26 DIAGNOSIS — R109 Unspecified abdominal pain: Secondary | ICD-10-CM | POA: Diagnosis present

## 2022-01-26 LAB — URINALYSIS, ROUTINE W REFLEX MICROSCOPIC
Bilirubin Urine: NEGATIVE
Glucose, UA: >=500 mg/dL — AB
Hgb urine dipstick: NEGATIVE
Ketones, ur: NEGATIVE mg/dL
Leukocytes,Ua: NEGATIVE
Nitrite: NEGATIVE
Protein, ur: NEGATIVE mg/dL
Specific Gravity, Urine: 1.015 (ref 1.005–1.030)
pH: 5 (ref 5.0–8.0)

## 2022-01-26 LAB — CBC WITH DIFFERENTIAL/PLATELET
Abs Immature Granulocytes: 0.02 10*3/uL (ref 0.00–0.07)
Basophils Absolute: 0 10*3/uL (ref 0.0–0.1)
Basophils Relative: 0 %
Eosinophils Absolute: 0.1 10*3/uL (ref 0.0–0.5)
Eosinophils Relative: 2 %
HCT: 38.3 % (ref 36.0–46.0)
Hemoglobin: 12 g/dL (ref 12.0–15.0)
Immature Granulocytes: 0 %
Lymphocytes Relative: 39 %
Lymphs Abs: 2.8 10*3/uL (ref 0.7–4.0)
MCH: 30.2 pg (ref 26.0–34.0)
MCHC: 31.3 g/dL (ref 30.0–36.0)
MCV: 96.5 fL (ref 80.0–100.0)
Monocytes Absolute: 0.6 10*3/uL (ref 0.1–1.0)
Monocytes Relative: 9 %
Neutro Abs: 3.6 10*3/uL (ref 1.7–7.7)
Neutrophils Relative %: 50 %
Platelets: 238 10*3/uL (ref 150–400)
RBC: 3.97 MIL/uL (ref 3.87–5.11)
RDW: 13.7 % (ref 11.5–15.5)
WBC: 7.2 10*3/uL (ref 4.0–10.5)
nRBC: 0 % (ref 0.0–0.2)

## 2022-01-26 LAB — COMPREHENSIVE METABOLIC PANEL
ALT: 11 U/L (ref 0–44)
AST: 30 U/L (ref 15–41)
Albumin: 3.7 g/dL (ref 3.5–5.0)
Alkaline Phosphatase: 107 U/L (ref 38–126)
Anion gap: 6 (ref 5–15)
BUN: 21 mg/dL (ref 8–23)
CO2: 27 mmol/L (ref 22–32)
Calcium: 9 mg/dL (ref 8.9–10.3)
Chloride: 106 mmol/L (ref 98–111)
Creatinine, Ser: 1.57 mg/dL — ABNORMAL HIGH (ref 0.44–1.00)
GFR, Estimated: 35 mL/min — ABNORMAL LOW (ref >60)
Glucose, Bld: 103 mg/dL — ABNORMAL HIGH (ref 70–99)
Potassium: 5 mmol/L (ref 3.5–5.1)
Sodium: 139 mmol/L (ref 135–145)
Total Bilirubin: 1.1 mg/dL (ref 0.3–1.2)
Total Protein: 7.4 g/dL (ref 6.5–8.1)

## 2022-01-26 LAB — LIPASE, BLOOD: Lipase: 31 U/L (ref 11–51)

## 2022-01-26 LAB — LACTIC ACID, PLASMA: Lactic Acid, Venous: 1.3 mmol/L (ref 0.5–1.9)

## 2022-01-26 MED ORDER — ONDANSETRON 4 MG PO TBDP: 4.0000 mg | ORAL_TABLET | Freq: Three times a day (TID) | ORAL | 0 refills | Status: DC | PRN

## 2022-01-26 MED ORDER — AMOXICILLIN-POT CLAVULANATE 875-125 MG PO TABS: 1.0000 | ORAL_TABLET | Freq: Two times a day (BID) | ORAL | 0 refills | Status: AC

## 2022-01-26 MED ORDER — AMOXICILLIN-POT CLAVULANATE 875-125 MG PO TABS
1.0000 | ORAL_TABLET | Freq: Once | ORAL | Status: AC
  Administered 2022-01-26: 1 via ORAL
  Filled 2022-01-26: qty 1

## 2022-01-26 MED ORDER — IOHEXOL 300 MG/ML  SOLN
100.0000 mL | Freq: Once | INTRAMUSCULAR | Status: AC | PRN
  Administered 2022-01-26: 100 mL via INTRAVENOUS

## 2022-01-26 MED ORDER — HYDROCODONE-ACETAMINOPHEN 5-325 MG PO TABS: 1.0000 | ORAL_TABLET | Freq: Four times a day (QID) | ORAL | 0 refills | Status: DC | PRN

## 2022-01-26 MED ORDER — LORAZEPAM 0.5 MG PO TABS
0.5000 mg | ORAL_TABLET | Freq: Once | ORAL | Status: AC
  Administered 2022-01-26: 0.5 mg via SUBLINGUAL
  Filled 2022-01-26: qty 1

## 2022-01-26 MED ORDER — ONDANSETRON HCL 4 MG/2ML IJ SOLN
4.0000 mg | Freq: Three times a day (TID) | INTRAMUSCULAR | Status: DC | PRN
  Administered 2022-01-26: 4 mg via INTRAVENOUS
  Filled 2022-01-26: qty 2

## 2022-01-26 MED ORDER — LACTATED RINGERS IV BOLUS
1000.0000 mL | Freq: Once | INTRAVENOUS | Status: AC
  Administered 2022-01-26: 1000 mL via INTRAVENOUS

## 2022-01-26 MED ORDER — FENTANYL CITRATE PF 50 MCG/ML IJ SOSY
50.0000 ug | PREFILLED_SYRINGE | Freq: Once | INTRAMUSCULAR | Status: AC
  Administered 2022-01-26: 50 ug via INTRAVENOUS
  Filled 2022-01-26: qty 1

## 2022-01-26 NOTE — Discharge Instructions (Addendum)
You were evaluated in the Emergency Department and after careful evaluation, we did not find any emergent condition requiring admission or further testing in the hospital. ? ?Your exam/testing today was overall reassuring.  Your laboratory work-up was reassuring.  Your CT imaging was concerning for mild to moderate diverticulitis without significant complication.  Please return to the emergency department in the event of failure of outpatient antibiotics, immediate worsening of your pain or discomfort, inability to tolerate oral intake. ?  ?Please return to the Emergency Department if you experience any worsening of your condition.  Thank you for allowing [place] to be a part of your care. ? ?

## 2022-01-26 NOTE — ED Provider Notes (Signed)
?[facility] ?Provider Note ? ? ?CSN: [id number] ?Arrival date & time: 01/26/22  1820 ? ?  ? ?History ? ?Chief Complaint  ?Patient presents with  ? Abdominal Pain  ? ? ?Stephanie [name] is a 71 y.o. female. ? ? ?Abdominal Pain ? ?  ? ?71 year old female with medical history significant for CAD, CHF, atrial fibrillation, sarcoidosis, obesity, IBS, depression, hypertension, diverticulitis, DM2 who presents to the emergency department with abdominal pain.  The patient states that since this past Thursday, she has been having abdominal pain and cramping. Has a hx of diverticulitis and feels similar. Also endorses pressure while urinating. No fevers or chills. No nausea or vomiting. No blood in stools or dark sticky stools. She states that she is passing gas and her last bowel movement was today and was normal. She did have a bout of diarrhea on Friday and Saturday. ? ?Home Medications ?Prior to Admission medications   ?Medication Sig Start Date End Date Taking? Authorizing Provider  ?amoxicillin-clavulanate (AUGMENTIN) 875-125 MG tablet Take 1 tablet by mouth every 12 (twelve) hours for 10 days. 01/26/22 02/05/22 Yes Regan Lemming, MD  ?HYDROcodone-acetaminophen (NORCO/VICODIN) 5-325 MG tablet Take 1-2 tablets by mouth every 6 (six) hours as needed. 01/26/22  Yes Regan Lemming, MD  ?ondansetron (ZOFRAN-ODT) 4 MG disintegrating tablet Take 1 tablet (4 mg total) by mouth every 8 (eight) hours as needed for nausea or vomiting. 01/26/22  Yes Regan Lemming, MD  ?Accu-Chek FastClix Lancets MISC  09/26/18   [provider]  ?allopurinol (ZYLOPRIM) 100 MG tablet TAKE 1/2 TABLET(50 MG) BY MOUTH TWICE DAILY ?Patient taking differently: Take 100 mg by mouth daily as needed (gout). 09/08/21   Gregor Hams, MD  ?Blood Pressure KIT 1 kit by Does not apply route daily. Check blood pressure once a day 03/11/21   Deboraha Sprang, MD  ?colchicine 0.6 MG tablet Take 0.6 mg by mouth daily as needed  (gout). 12/25/21   [provider]  ?dronedarone (MULTAQ) 400 MG tablet Take 1 tablet (400 mg total) by mouth 2 (two) times daily. 07/18/21   Shirley Friar, PA-C  ?furosemide (LASIX) 40 MG tablet Take 1 tablet (40 mg total) by mouth daily. 07/18/21   Shirley Friar, PA-C  ?ibuprofen (ADVIL) 200 MG tablet Take 400 mg by mouth every 6 (six) hours as needed for headache or mild pain.     [provider]  ?Insulin Glargine (BASAGLAR KWIKPEN) 100 UNIT/ML Inject 20 Units into the skin daily. 11/19/20   [provider]  ?Insulin Pen Needle (NOVOFINE) 30G X 8 MM MISC For Humalog meal time TID 08/22/18   Tysinger, Camelia Eng, PA-C  ?JARDIANCE 25 MG TABS tablet Take 25 mg by mouth daily. 11/29/21   [provider]  ?metoprolol succinate (TOPROL XL) 25 MG 24 hr tablet Take 1 tablet (25 mg total) by mouth daily. 07/18/21   Shirley Friar, PA-C  ?NOVOLOG FLEXPEN 100 UNIT/ML FlexPen Inject 5 Units into the skin 3 (three) times daily with meals. ?Patient taking differently: Inject 13 Units into the skin daily before supper. 11/16/20   Donne Hazel, MD  ?PRADAXA 150 MG CAPS capsule TAKE 1 CAPSULE BY MOUTH TWICE DAILY( EVERY TWELVE HOURS) 03/19/21   Deboraha Sprang, MD  ?sacubitril-valsartan (ENTRESTO) 24-26 MG Take 1 tablet by mouth 2 (two) times daily. 07/18/21   Shirley Friar, PA-C  ?simvastatin (ZOCOR) 10 MG tablet Take 10 mg by mouth daily.  12/12/19   [provider]  ?   ? ?Allergies    ?Amiodarone, Aspirin, Methimazole, Propoxyphene, Albuterol, Atorvastatin, and Livalo [pitavastatin]   ? ?Review of Systems   ?Review of Systems  ?Gastrointestinal:  Positive for abdominal pain.  ?All other systems reviewed and are negative. ? ?Physical Exam ?Updated Vital Signs ?BP (!) 165/74 (BP Location: Left Arm)   Pulse 80   Temp 97.8 ?F (36.6 ?C) (Oral)   Resp 16   SpO2 100%  ?Physical Exam ?Vitals and nursing note reviewed.  ?Constitutional:   ?   General: She is  not in acute distress. ?   Appearance: She is well-developed.  ?HENT:  ?   Head: Normocephalic and atraumatic.  ?Eyes:  ?   Conjunctiva/sclera: Conjunctivae normal.  ?Cardiovascular:  ?   Rate and Rhythm: Normal rate and regular rhythm.  ?   Heart sounds: No murmur heard. ?Pulmonary:  ?   Effort: Pulmonary effort is normal. No respiratory distress.  ?   Breath sounds: Normal breath sounds.  ?Abdominal:  ?   Palpations: Abdomen is soft.  ?   Tenderness: There is abdominal tenderness in the left lower quadrant.  ?Musculoskeletal:     ?   General: No swelling.  ?   Cervical back: Neck supple.  ?Skin: ?   General: Skin is warm and dry.  ?   Capillary Refill: Capillary refill takes less than 2 seconds.  ?Neurological:  ?   Mental Status: She is alert.  ?Psychiatric:     ?   Mood and Affect: Mood normal.  ? ? ?ED Results / Procedures / Treatments   ?Labs ?(all labs ordered are listed, but only abnormal results are displayed) ?Labs Reviewed  ?COMPREHENSIVE METABOLIC PANEL - Abnormal; Notable for the following components:  ?    Result Value  ? Glucose, Bld 103 (*)   ? Creatinine, Ser 1.57 (*)   ? GFR, Estimated 35 (*)   ? All other components within normal limits  ?URINALYSIS, ROUTINE W REFLEX MICROSCOPIC - Abnormal; Notable for the following components:  ? Color, Urine STRAW (*)   ? Glucose, UA >=500 (*)   ? Bacteria, UA RARE (*)   ? All other components within normal limits  ?LIPASE, BLOOD  ?CBC WITH DIFFERENTIAL/PLATELET  ?LACTIC ACID, PLASMA  ? ? ?EKG ?None ? ?Radiology ?CT ABDOMEN PELVIS W CONTRAST ? ?Result Date: 01/26/2022 ?CLINICAL DATA:  Diffuse abdominal pain with nausea and vomiting. EXAM: CT ABDOMEN AND PELVIS WITH CONTRAST TECHNIQUE: Multidetector CT imaging of the abdomen and pelvis was performed using the standard protocol following bolus administration of intravenous contrast. RADIATION DOSE REDUCTION: This exam was performed according to the departmental dose-optimization program which includes automated  exposure control, adjustment of the mA and/or kV according to patient size and/or use of iterative reconstruction technique. CONTRAST:  127m OMNIPAQUE IOHEXOL 300 MG/ML  SOLN COMPARISON:  None. FINDINGS: Lower chest: No acute abnormality. Hepatobiliary: No focal liver abnormality is seen. Status post cholecystectomy. No biliary dilatation. Pancreas: Unremarkable. No pancreatic ductal dilatation or surrounding inflammatory changes. Spleen: Normal in size without focal abnormality. Adrenals/Urinary Tract: Adrenal glands are unremarkable. Kidneys are normal, without renal calculi, focal lesion, or hydronephrosis. Bladder is unremarkable. Stomach/Bowel: Stomach is within normal limits. Appendix appears normal. No evidence of bowel dilatation. Mild to moderately inflamed diverticula are seen within the mid and distal sigmoid colon. Vascular/Lymphatic: No significant vascular findings are present. No enlarged abdominal or pelvic lymph nodes. Reproductive: Status post hysterectomy. No adnexal masses. Other: No abdominal wall  hernia or abnormality. No abdominopelvic ascites. Musculoskeletal: Degenerative changes are seen at the level of L5-S1. IMPRESSION: 1. Mild to moderately severity sigmoid diverticulitis. 2. Evidence of prior cholecystectomy and hysterectomy. Electronically Signed   By: Virgina Norfolk M.D.   On: 01/26/2022 21:48   ? ?Procedures ?Procedures  ? ? ?Medications Ordered in ED ?Medications  ?lactated ringers bolus 1,000 mL (0 mLs Intravenous Stopped 01/26/22 2246)  ?fentaNYL (SUBLIMAZE) injection 50 mcg (50 mcg Intravenous Given 01/26/22 2018)  ?iohexol (OMNIPAQUE) 300 MG/ML solution 100 mL (100 mLs Intravenous Contrast Given 01/26/22 2101)  ?LORazepam (ATIVAN) tablet 0.5 mg (0.5 mg Sublingual Given 01/26/22 2118)  ?amoxicillin-clavulanate (AUGMENTIN) 875-125 MG per tablet 1 tablet (1 tablet Oral Given 01/26/22 2302)  ? ? ?ED Course/ Medical Decision Making/ A&P ?  ?                        ?Medical Decision  Making ?Amount and/or Complexity of Data Reviewed ?Labs: ordered. ?Radiology: ordered. ? ?Risk ?Prescription drug management. ? ? ?71 year old female with medical history significant for CAD, CHF, atrial fibrillat

## 2022-01-26 NOTE — ED Triage Notes (Signed)
Pt arrived via POV, c/o abd pain. Denies any n/v diarrhea.  ?

## 2022-01-27 ENCOUNTER — Ambulatory Visit (HOSPITAL_COMMUNITY)
Admission: RE | Admit: 2022-01-27 | Discharge: 2022-01-27 | Disposition: A | Discharge status: Home / Self Care | Payer: Medicare Other | Source: Ambulatory Visit | Attending: Physician Assistant | Admitting: Physician Assistant

## 2022-01-27 ENCOUNTER — Encounter (HOSPITAL_COMMUNITY): Payer: Self-pay | Admitting: Physician Assistant

## 2022-01-27 VITALS — BP 118/80 | HR 91 | Ht 63.0 in | Wt 282.6 lb

## 2022-01-27 DIAGNOSIS — E669 Obesity, unspecified: Secondary | ICD-10-CM | POA: Diagnosis not present

## 2022-01-27 DIAGNOSIS — Z6841 Body Mass Index (BMI) 40.0 and over, adult: Secondary | ICD-10-CM | POA: Diagnosis not present

## 2022-01-27 DIAGNOSIS — K5792 Diverticulitis of intestine, part unspecified, without perforation or abscess without bleeding: Secondary | ICD-10-CM | POA: Insufficient documentation

## 2022-01-27 DIAGNOSIS — D6869 Other thrombophilia: Secondary | ICD-10-CM | POA: Insufficient documentation

## 2022-01-27 DIAGNOSIS — Z8249 Family history of ischemic heart disease and other diseases of the circulatory system: Secondary | ICD-10-CM | POA: Insufficient documentation

## 2022-01-27 DIAGNOSIS — I4819 Other persistent atrial fibrillation: Secondary | ICD-10-CM | POA: Insufficient documentation

## 2022-01-27 DIAGNOSIS — E785 Hyperlipidemia, unspecified: Secondary | ICD-10-CM | POA: Diagnosis not present

## 2022-01-27 DIAGNOSIS — I10 Essential (primary) hypertension: Secondary | ICD-10-CM | POA: Insufficient documentation

## 2022-01-27 NOTE — Patient Instructions (Signed)
[place] [facility] [employer] [facility] ?

## 2022-01-27 NOTE — Progress Notes (Signed)
? ? ?Primary Care Physician: Pcp, No ?Primary Electrophysiologist: Dr [name] ?Referring Physician: Dr [name] ? ? ?Stephanie [name] is a 71 y.o. female with a history of HTN, tachycardia mediated CM, HLD, sarcoidosis, and persistent atrial fibrillation who presents for follow up in the [facility]. The patient was initially diagnosed with atrial fibrillation remotely. She has had DCCV in 2014, 2018, and 03/04/20. She was seen for afib ablation consideration in 2015 but opted not to proceed. Previously maintained on amiodarone but this was stopped 2/2 tremors and hyperthyroidism. Patient is on Pradaxa for a CHADS2VASC score of 4. She was seen 04/05/20 after DCCV and was back in afib. Repeat echo showed decreased EF of 45-50% and dofetilide was recommended. Unfortunately, dofetilide admissions were temporarily on hold 2/2 increase in COVID-19 hospitalizations.  ? ?On follow up today, patient was admitted 12/6 for dofetilide loading. Unfortunately, her QT became too prolonged and the medication had to be stopped. She did convert to SR after the first dose. She was started on Multaq. She had recurrence of her afib and was scheduled for DCCV on 10/09/20 but presented in SR. She was seen by Dr [name] on 12/24/21 and was found to be back in afib with symptoms of fluid overload. This DCCV was also cancelled as she was back in SR.  ? ?On follow up today, patient was seen yesterday at the ED with abdominal pain and was found to have mild-moderate diverticulitis. She has a Rx to start augmentin today. She is back in rate controlled afib. She is fatigued but admits she is not sleeping well with her abdominal pain.   ? ?Today, she denies symptoms of palpitations, chest pain, shortness of breath, orthopnea, PND, lower extremity edema, dizziness, presyncope, syncope, snoring, daytime somnolence, bleeding, or neurologic sequela. The patient is tolerating medications without difficulties and is otherwise without  complaint today.  ? ? ?Atrial Fibrillation Risk Factors: ? ?she does not have symptoms or diagnosis of sleep apnea. ?she does not have a history of rheumatic fever. ?she does not have a history of alcohol use. ? ? ?she has a BMI of Body mass index is 50.06 kg/m?Marland KitchenMarland Kitchen ?Filed Weights  ? 01/27/22 1258  ?Weight: 128.2 kg  ? ? ? ?Family History  ?Problem Relation Age of Onset  ? Pneumonia Father   ?     deceased  ? Hypertension Father   ? Cancer Father   ? Multiple sclerosis Mother   ?     hx  ? Emphysema Other   ?     runs in the family  ? ? ? ?Atrial Fibrillation Management history: ? ?Previous antiarrhythmic drugs: amiodarone, dofetilide (QT prolong), Multaq ?Previous cardioversions: 2014, 2018, 03/04/20 ?Previous ablations: none ?CHADS2VASC score: 4 ?Anticoagulation history: Pradaxa  ? ? ?Past Medical History:  ?Diagnosis Date  ? Anemia   ? Ankle fracture, right   ? x 2  ? Antineoplastic and immunosuppressive drugs causing adverse effect in therapeutic use   ? Asthma   ? Chronic venous hypertension without complications   ? Colon polyps 2009  ? Coronary artery disease   ? no CAD by cath 11.2010  ? Depressive disorder, not elsewhere classified   ? Diabetes mellitus type 2 in obese [facility])   ? hgb AIC 6.1 09/02/2009, insulin dependent   ? Diastolic CHF, chronic (HCC)   ? reports stable  ? Diverticulosis of colon 2009  ? colonoscopy  ? Gout   ? cortisone  shots helped  ? Hepatitis,  unspecified   ? hx of/per pt, never was told of having hepatitis  ? Hx of hysterectomy 2002  ? Hyperlipidemia   ? Hypertension   ? controlled on medication   ? Hypothyroidism   ? reports her thryoid levels are normal now   ? Irritable bowel syndrome   ? Lung nodule   ? 94m RLL nodule on CT Chest  07/2009, resolved by 2011 CT  ? Noncompliance   ? Obesity   ? [place]   ? s/p DCCV 07/2009; Pradaxa Rx, states she is in sinus rhythm now   ? PONV (postoperative nausea and vomiting)   ? after receiving "too much anesthesia"  after a hernia surgery   ? Sarcoidosis   ? dx by eye exam; seen during eye exam , report asymtptomatic "i dont have it now'   ? Sleep disturbance 06/2013  ? sleep study, mild abnormality, no criteria for CPAP  ? ?Past Surgical History:  ?Procedure Laterality Date  ? ABDOMINAL HYSTERECTOMY    ? CARDIOVERSION    ? x 2  ? CARDIOVERSION N/A 02/27/2013  ? Procedure: CARDIOVERSION;  Surgeon: BLelon Perla MD;  [place] [facility]  Service: Cardiovascular;  Laterality: N/A;  ? CARDIOVERSION N/A 03/24/2017  ? Procedure: CARDIOVERSION;  Surgeon: NJosue Hector MD;  [facility]  Service: Cardiovascular;  Laterality: N/A;  ? CARDIOVERSION N/A 03/04/2020  ? Procedure: CARDIOVERSION;  Surgeon: NJosue Hector MD;  [facility];  Service: Cardiovascular;  Laterality: N/A;  ? CHOLECYSTECTOMY  early 856s ? COLONOSCOPY    ? Diverticulosis, colon polyps, repeat 2014, Dr. KDeatra Ina ? COLONOSCOPY WITH PROPOFOL N/A 07/25/2019  ? Procedure: COLONOSCOPY WITH PROPOFOL;  Surgeon: AYetta Flock MD;  Location: [facility] ENDOSCOPY;  Service: Gastroenterology;  Laterality: N/A;  ? ESOPHAGOGASTRODUODENOSCOPY N/A 05/16/2014  ? Procedure: ESOPHAGOGASTRODUODENOSCOPY (EGD);  Surgeon: SWonda Horner MD;  Location: [facility] ORS;  Service: Gastroenterology;  Laterality: N/A;  ? HERNIA REPAIR  [id number] ? umbilical hernia  ? MYOMECTOMY  2000  ? POLYPECTOMY  07/25/2019  ? Procedure: POLYPECTOMY;  Surgeon: AYetta Flock MD;  [facility];  Service: Gastroenterology;;  ? ? ?Current Outpatient Medications  ?Medication Sig Dispense Refill  ? Accu-Chek FastClix Lancets MISC     ? allopurinol (ZYLOPRIM) 100 MG tablet TAKE 1/2 TABLET(50 MG) BY MOUTH TWICE DAILY (Patient taking differently: Take 100 mg by mouth daily as needed (gout).) 90 tablet 3  ? amoxicillin-clavulanate (AUGMENTIN) 875-125 MG tablet Take 1 tablet by mouth every 12 (twelve) hours for 10 days. 20 tablet 0  ? Blood Pressure KIT 1 kit by Does not apply route  daily. Check blood pressure once a day 1 kit 0  ? colchicine 0.6 MG tablet Take 0.6 mg by mouth daily as needed (gout).    ? dronedarone (MULTAQ) 400 MG tablet Take 1 tablet (400 mg total) by mouth 2 (two) times daily. 180 tablet 3  ? furosemide (LASIX) 40 MG tablet Take 1 tablet (40 mg total) by mouth daily. 90 tablet 3  ? HYDROcodone-acetaminophen (NORCO/VICODIN) 5-325 MG tablet Take 1-2 tablets by mouth every 6 (six) hours as needed. 10 tablet 0  ? ibuprofen (ADVIL) 200 MG tablet Take 400 mg by mouth every 6 (six) hours as needed for headache or mild pain.     ? Insulin Glargine (BASAGLAR KWIKPEN) 100 UNIT/ML Inject 20 Units into the skin daily.    ? Insulin Pen Needle (NOVOFINE) 30G X 8 MM MISC  For Humalog meal time TID 100 each 11  ? JARDIANCE 25 MG TABS tablet Take 25 mg by mouth daily.    ? metoprolol succinate (TOPROL XL) 25 MG 24 hr tablet Take 1 tablet (25 mg total) by mouth daily. 90 tablet 3  ? NOVOLOG FLEXPEN 100 UNIT/ML FlexPen Inject 5 Units into the skin 3 (three) times daily with meals. (Patient taking differently: Inject 13 Units into the skin daily before supper.) 15 mL 0  ? ondansetron (ZOFRAN-ODT) 4 MG disintegrating tablet Take 1 tablet (4 mg total) by mouth every 8 (eight) hours as needed for nausea or vomiting. 20 tablet 0  ? PRADAXA 150 MG CAPS capsule TAKE 1 CAPSULE BY MOUTH TWICE DAILY( EVERY TWELVE HOURS) 60 capsule 5  ? sacubitril-valsartan (ENTRESTO) 24-26 MG Take 1 tablet by mouth 2 (two) times daily. 180 tablet 3  ? simvastatin (ZOCOR) 10 MG tablet Take 10 mg by mouth daily.     ? ?No current facility-administered medications for this encounter.  ? ? ?Allergies  ?Allergen Reactions  ? Amiodarone Other (See Comments)  ?  adverse reaction: Tremor/gait disurbance  ? Aspirin Other (See Comments)  ?  "Burns my stomach"  ? Methimazole Nausea And Vomiting  ? Propoxyphene Nausea And Vomiting  ? Albuterol Palpitations  ? Atorvastatin Other (See Comments)  ?  Body aches and pains--stiffness.    ? Livalo [Pitavastatin]   ?  Body aches  ? ? ?Social History  ? ?Socioeconomic History  ? Marital status: Single  ?  Spouse name: Not on file  ? Number of children: 5  ? Years of education: college  ? [place]

## 2022-02-10 ENCOUNTER — Other Ambulatory Visit: Payer: Self-pay | Admitting: Pharmacist

## 2022-02-10 MED ORDER — DABIGATRAN ETEXILATE MESYLATE 150 MG PO CAPS: ORAL_CAPSULE | ORAL | 5 refills | Status: DC

## 2022-02-12 LAB — HEMOGLOBIN A1C: Hemoglobin A1C: 6.8

## 2022-02-12 LAB — LIPID PANEL
Cholesterol: 179 (ref 0–200)
HDL: 55 (ref 35–70)
LDL Cholesterol: 101
Triglycerides: 129 (ref 40–160)

## 2022-02-12 LAB — MICROALBUMIN, URINE: Microalb, Ur: 14.13

## 2022-02-18 ENCOUNTER — Inpatient Hospital Stay (HOSPITAL_COMMUNITY): Admission: RE | Admit: 2022-02-18 | Payer: Medicare Other | Source: Ambulatory Visit | Admitting: Physician Assistant

## 2022-02-18 ENCOUNTER — Other Ambulatory Visit: Payer: Self-pay

## 2022-02-18 DIAGNOSIS — I4819 Other persistent atrial fibrillation: Secondary | ICD-10-CM

## 2022-02-18 MED ORDER — DABIGATRAN ETEXILATE MESYLATE 150 MG PO CAPS: ORAL_CAPSULE | ORAL | 5 refills | Status: DC

## 2022-02-18 NOTE — Telephone Encounter (Signed)
Prescription refill request for Pradaxa received.  ?Indication: Afib  ?Last office visit: 01/27/22 Marlene Lard) ?Weight: 128.2kg ?Age: 71 ?Scr: 1.43 (08/14/21) ?CrCl: 75.30m/min ? ?Appropriate dose and refill sent to requested pharmacy.  ? ? ?

## 2022-02-18 NOTE — Progress Notes (Incomplete)
? ? ?Primary Care Physician: Pcp, No ?Primary Electrophysiologist: Dr [name] ?Referring Physician: Dr [name] ? ? ?Stephanie [name] is a 71 y.o. female with a history of HTN, tachycardia mediated CM, HLD, sarcoidosis, and persistent atrial fibrillation who presents for follow up in the [facility]. The patient was initially diagnosed with atrial fibrillation remotely. She has had DCCV in 2014, 2018, and 03/04/20. She was seen for afib ablation consideration in 2015 but opted not to proceed. Previously maintained on amiodarone but this was stopped 2/2 tremors and hyperthyroidism. Patient is on Pradaxa for a CHADS2VASC score of 4. She was seen 04/05/20 after DCCV and was back in afib. Repeat echo showed decreased EF of 45-50% and dofetilide was recommended. Unfortunately, dofetilide admissions were temporarily on hold 2/2 increase in COVID-19 hospitalizations.  ? ?On follow up today, patient was admitted 12/6 for dofetilide loading. Unfortunately, her QT became too prolonged and the medication had to be stopped. She did convert to SR after the first dose. She was started on Multaq. She had recurrence of her afib and was scheduled for DCCV on 10/09/20 but presented in SR. She was seen by Dr [name] on 12/24/21 and was found to be back in afib with symptoms of fluid overload. This DCCV was also cancelled as she was back in SR.  ? ?Patient was seen 01/26/22 at the ED with abdominal pain and was found to have mild-moderate diverticulitis and started on augmentin. She was back in rate controlled afib.  ? ?On follow up today, ***  ? ?Today, she denies symptoms of ***palpitations, chest pain, shortness of breath, orthopnea, PND, lower extremity edema, dizziness, presyncope, syncope, snoring, daytime somnolence, bleeding, or neurologic sequela. The patient is tolerating medications without difficulties and is otherwise without complaint today.  ? ? ?Atrial Fibrillation Risk Factors: ? ?she does not have  symptoms or diagnosis of sleep apnea. ?she does not have a history of rheumatic fever. ?she does not have a history of alcohol use. ? ? ?she has a BMI of There is no height or weight on file to calculate BMI.Marland Kitchen ?There were no vitals filed for this visit. ? ? ? ?Family History  ?Problem Relation Age of Onset  ? Pneumonia Father   ?     deceased  ? Hypertension Father   ? Cancer Father   ? Multiple sclerosis Mother   ?     hx  ? Emphysema Other   ?     runs in the family  ? ? ? ?Atrial Fibrillation Management history: ? ?Previous antiarrhythmic drugs: amiodarone, dofetilide (QT prolong), Multaq ?Previous cardioversions: 2014, 2018, 03/04/20 ?Previous ablations: none ?CHADS2VASC score: 4 ?Anticoagulation history: Pradaxa  ? ? ?Past Medical History:  ?Diagnosis Date  ? Anemia   ? Ankle fracture, right   ? x 2  ? Antineoplastic and immunosuppressive drugs causing adverse effect in therapeutic use   ? Asthma   ? Chronic venous hypertension without complications   ? Colon polyps 2009  ? Coronary artery disease   ? no CAD by cath 11.2010  ? Depressive disorder, not elsewhere classified   ? Diabetes mellitus type 2 in obese [facility])   ? hgb AIC 6.1 09/02/2009, insulin dependent   ? Diastolic CHF, chronic (HCC)   ? reports stable  ? Diverticulosis of colon 2009  ? colonoscopy  ? Gout   ? cortisone  shots helped  ? Hepatitis, unspecified   ? hx of/per pt, never was told of having hepatitis  ?  Hx of hysterectomy 2002  ? Hyperlipidemia   ? Hypertension   ? controlled on medication   ? Hypothyroidism   ? reports her thryoid levels are normal now   ? Irritable bowel syndrome   ? Lung nodule   ? 53m RLL nodule on CT Chest  07/2009, resolved by 2011 CT  ? Noncompliance   ? Obesity   ? Persistent atrial fibrillation ([place]   ? s/p DCCV 07/2009; Pradaxa Rx, states she is in sinus rhythm now   ? PONV (postoperative nausea and vomiting)   ? after receiving "too much anesthesia" after a hernia surgery   ? Sarcoidosis   ? dx by eye exam; seen  during eye exam , report asymtptomatic "i dont have it now'   ? Sleep disturbance 06/2013  ? sleep study, mild abnormality, no criteria for CPAP  ? ?Past Surgical History:  ?Procedure Laterality Date  ? ABDOMINAL HYSTERECTOMY    ? CARDIOVERSION    ? x 2  ? CARDIOVERSION N/A 02/27/2013  ? Procedure: CARDIOVERSION;  Surgeon: BLelon Perla MD;  [place]  Service: Cardiovascular;  Laterality: N/A;  ? CARDIOVERSION N/A 03/24/2017  ? Procedure: CARDIOVERSION;  Surgeon: NJosue Hector MD;  [facility]  Service: Cardiovascular;  Laterality: N/A;  ? CARDIOVERSION N/A 03/04/2020  ? Procedure: CARDIOVERSION;  Surgeon: NJosue Hector MD;  [facility]  Service: Cardiovascular;  Laterality: N/A;  ? CHOLECYSTECTOMY  early 842s ? COLONOSCOPY    ? Diverticulosis, colon polyps, repeat 2014, Dr. KDeatra Ina ? COLONOSCOPY WITH PROPOFOL N/A 07/25/2019  ? Procedure: COLONOSCOPY WITH PROPOFOL;  Surgeon: AYetta Flock MD;  [facility];  Service: Gastroenterology;  Laterality: N/A;  ? ESOPHAGOGASTRODUODENOSCOPY N/A 05/16/2014  ? Procedure: ESOPHAGOGASTRODUODENOSCOPY (EGD);  Surgeon: SWonda Horner MD;  [facility];  Service: Gastroenterology;  Laterality: N/A;  ? HERNIA REPAIR  [id number] ? umbilical hernia  ? MYOMECTOMY  2000  ? POLYPECTOMY  07/25/2019  ? Procedure: POLYPECTOMY;  Surgeon: AYetta Flock MD;  [facility];  Service: Gastroenterology;;  ? ? ?Current Outpatient Medications  ?Medication Sig Dispense Refill  ? Accu-Chek FastClix Lancets MISC     ? allopurinol (ZYLOPRIM) 100 MG tablet TAKE 1/2 TABLET(50 MG) BY MOUTH TWICE DAILY (Patient taking differently: Take 100 mg by mouth daily as needed (gout).) 90 tablet 3  ? Blood Pressure KIT 1 kit by Does not apply route daily. Check blood pressure once a day 1 kit 0  ? colchicine 0.6 MG tablet Take 0.6 mg by mouth daily as needed (gout).    ? dabigatran (PRADAXA) 150 MG CAPS capsule TAKE 1 CAPSULE BY MOUTH TWICE  DAILY( EVERY TWELVE HOURS) 60 capsule 5  ? dronedarone (MULTAQ) 400 MG tablet Take 1 tablet (400 mg total) by mouth 2 (two) times daily. 180 tablet 3  ? furosemide (LASIX) 40 MG tablet Take 1 tablet (40 mg total) by mouth daily. 90 tablet 3  ? HYDROcodone-acetaminophen (NORCO/VICODIN) 5-325 MG tablet Take 1-2 tablets by mouth every 6 (six) hours as needed. 10 tablet 0  ? ibuprofen (ADVIL) 200 MG tablet Take 400 mg by mouth every 6 (six) hours as needed for headache or mild pain.     ? Insulin Glargine (BASAGLAR KWIKPEN) 100 UNIT/ML Inject 20 Units into the skin daily.    ? Insulin Pen Needle (NOVOFINE) 30G X 8 MM MISC For Humalog meal time TID 100 each 11  ? JARDIANCE 25 MG TABS tablet Take  25 mg by mouth daily.    ? metoprolol succinate (TOPROL XL) 25 MG 24 hr tablet Take 1 tablet (25 mg total) by mouth daily. 90 tablet 3  ? NOVOLOG FLEXPEN 100 UNIT/ML FlexPen Inject 5 Units into the skin 3 (three) times daily with meals. (Patient taking differently: Inject 13 Units into the skin daily before supper.) 15 mL 0  ? ondansetron (ZOFRAN-ODT) 4 MG disintegrating tablet Take 1 tablet (4 mg total) by mouth every 8 (eight) hours as needed for nausea or vomiting. 20 tablet 0  ? sacubitril-valsartan (ENTRESTO) 24-26 MG Take 1 tablet by mouth 2 (two) times daily. 180 tablet 3  ? simvastatin (ZOCOR) 10 MG tablet Take 10 mg by mouth daily.     ? ?No current facility-administered medications for this visit.  ? ? ?Allergies  ?Allergen Reactions  ? Amiodarone Other (See Comments)  ?  adverse reaction: Tremor/gait disurbance  ? Aspirin Other (See Comments)  ?  "Burns my stomach"  ? Methimazole Nausea And Vomiting  ? Propoxyphene Nausea And Vomiting  ? Albuterol Palpitations  ? Atorvastatin Other (See Comments)  ?  Body aches and pains--stiffness.   ? Livalo [Pitavastatin]   ?  Body aches  ? ? ?Social History  ? ?Socioeconomic History  ? Marital status: Single  ?  Spouse name: Not on file  ? Number of children: 5  ? Years of  education: college  ? Highest education level: Not on file  ?Occupational History  ? Occupation: Disabled.  ?Tobacco Use  ? Smoking status: Never  ? Smokeless tobacco: Never  ? Tobacco comments:  ?  Never smoke 04/1

## 2022-02-24 ENCOUNTER — Ambulatory Visit (HOSPITAL_COMMUNITY)
Admission: RE | Admit: 2022-02-24 | Discharge: 2022-02-24 | Disposition: A | Discharge status: Home / Self Care | Payer: Medicare Other | Source: Ambulatory Visit | Attending: Physician Assistant | Admitting: Physician Assistant

## 2022-02-24 ENCOUNTER — Encounter (HOSPITAL_COMMUNITY): Payer: Self-pay | Admitting: Physician Assistant

## 2022-02-24 VITALS — BP 142/88 | HR 98 | Ht 63.0 in | Wt 275.8 lb

## 2022-02-24 DIAGNOSIS — D869 Sarcoidosis, unspecified: Secondary | ICD-10-CM | POA: Insufficient documentation

## 2022-02-24 DIAGNOSIS — R000 Tachycardia, unspecified: Secondary | ICD-10-CM | POA: Diagnosis not present

## 2022-02-24 DIAGNOSIS — I10 Essential (primary) hypertension: Secondary | ICD-10-CM | POA: Diagnosis not present

## 2022-02-24 DIAGNOSIS — I428 Other cardiomyopathies: Secondary | ICD-10-CM | POA: Diagnosis not present

## 2022-02-24 DIAGNOSIS — D6869 Other thrombophilia: Secondary | ICD-10-CM | POA: Diagnosis not present

## 2022-02-24 DIAGNOSIS — I4819 Other persistent atrial fibrillation: Secondary | ICD-10-CM | POA: Insufficient documentation

## 2022-02-24 DIAGNOSIS — Z7901 Long term (current) use of anticoagulants: Secondary | ICD-10-CM | POA: Diagnosis not present

## 2022-02-24 DIAGNOSIS — Z6841 Body Mass Index (BMI) 40.0 and over, adult: Secondary | ICD-10-CM | POA: Diagnosis not present

## 2022-02-24 DIAGNOSIS — E669 Obesity, unspecified: Secondary | ICD-10-CM | POA: Diagnosis not present

## 2022-02-24 NOTE — Progress Notes (Signed)
? ? ?Primary Care Physician: Pcp, No ?Primary Electrophysiologist: Dr [name] ?Referring Physician: Dr [name] ? ? ?Stephanie [name] is a 71 y.o. female with a history of HTN, tachycardia mediated CM, HLD, sarcoidosis, and persistent atrial fibrillation who presents for follow up in the [facility]. The patient was initially diagnosed with atrial fibrillation remotely. She has had DCCV in 2014, 2018, and 03/04/20. She was seen for afib ablation consideration in 2015 but opted not to proceed. Previously maintained on amiodarone but this was stopped 2/2 tremors and hyperthyroidism. Patient is on Pradaxa for a CHADS2VASC score of 4. She was seen 04/05/20 after DCCV and was back in afib. Repeat echo showed decreased EF of 45-50% and dofetilide was recommended. Unfortunately, dofetilide admissions were temporarily on hold 2/2 increase in COVID-19 hospitalizations.  ? ?On follow up today, patient was admitted 12/6 for dofetilide loading. Unfortunately, her QT became too prolonged and the medication had to be stopped. She did convert to SR after the first dose. She was started on Multaq. She had recurrence of her afib and was scheduled for DCCV on 10/09/20 but presented in SR. She was seen by Dr [name] on 12/24/21 and was found to be back in afib with symptoms of fluid overload. This DCCV was also cancelled as she was back in SR.  ? ?Patient was seen 01/26/22 at the ED with abdominal pain and was found to have mild-moderate diverticulitis and started on augmentin. She was back in rate controlled afib.  ? ?On follow up today, patient reports that she has felt well since her last visit. Her diverticulitis has resolved. She does report she has been under considerable stress with family members being in and out of the hospital. She denies any bleeding issues on anticoagulation.   ? ?Today, she denies symptoms of palpitations, chest pain, shortness of breath, orthopnea, PND, lower extremity edema, dizziness,  presyncope, syncope, snoring, daytime somnolence, bleeding, or neurologic sequela. The patient is tolerating medications without difficulties and is otherwise without complaint today.  ? ? ?Atrial Fibrillation Risk Factors: ? ?she does not have symptoms or diagnosis of sleep apnea. ?she does not have a history of rheumatic fever. ?she does not have a history of alcohol use. ? ? ?she has a BMI of Body mass index is 48.86 kg/m?Marland KitchenMarland Kitchen ?Filed Weights  ? 02/24/22 1112  ?Weight: 125.1 kg  ? ? ?Family History  ?Problem Relation Age of Onset  ? Pneumonia Father   ?     deceased  ? Hypertension Father   ? Cancer Father   ? Multiple sclerosis Mother   ?     hx  ? Emphysema Other   ?     runs in the family  ? ? ? ?Atrial Fibrillation Management history: ? ?Previous antiarrhythmic drugs: amiodarone, dofetilide (QT prolong), Multaq ?Previous cardioversions: 2014, 2018, 03/04/20 ?Previous ablations: none ?CHADS2VASC score: 4 ?Anticoagulation history: Pradaxa  ? ? ?Past Medical History:  ?Diagnosis Date  ? Anemia   ? Ankle fracture, right   ? x 2  ? Antineoplastic and immunosuppressive drugs causing adverse effect in therapeutic use   ? Asthma   ? Chronic venous hypertension without complications   ? Colon polyps 2009  ? Coronary artery disease   ? no CAD by cath 11.2010  ? Depressive disorder, not elsewhere classified   ? Diabetes mellitus type 2 in obese [facility])   ? hgb AIC 6.1 09/02/2009, insulin dependent   ? Diastolic CHF, chronic (HCC)   ?  reports stable  ? Diverticulosis of colon 2009  ? colonoscopy  ? Gout   ? cortisone  shots helped  ? Hepatitis, unspecified   ? hx of/per pt, never was told of having hepatitis  ? Hx of hysterectomy 2002  ? Hyperlipidemia   ? Hypertension   ? controlled on medication   ? Hypothyroidism   ? reports her thryoid levels are normal now   ? Irritable bowel syndrome   ? Lung nodule   ? 18mm RLL nodule on CT Chest  07/2009, resolved by 2011 CT  ? Noncompliance   ? Obesity   ? Persistent atrial  fibrillation ([place])   ? s/p DCCV 07/2009; Pradaxa Rx, states she is in sinus rhythm now   ? PONV (postoperative nausea and vomiting)   ? after receiving "too much anesthesia" after a hernia surgery   ? Sarcoidosis   ? dx by eye exam; seen during eye exam , report asymtptomatic "i dont have it now'   ? Sleep disturbance 06/2013  ? sleep study, mild abnormality, no criteria for CPAP  ? ?Past Surgical History:  ?Procedure Laterality Date  ? ABDOMINAL HYSTERECTOMY    ? CARDIOVERSION    ? x 2  ? CARDIOVERSION N/A 02/27/2013  ? Procedure: CARDIOVERSION;  Surgeon: Lelon Perla, MD;  Location: [place];  Service: Cardiovascular;  Laterality: N/A;  ? CARDIOVERSION N/A 03/24/2017  ? Procedure: CARDIOVERSION;  Surgeon: Josue Hector, MD;  [facility];  Service: Cardiovascular;  Laterality: N/A;  ? CARDIOVERSION N/A 03/04/2020  ? Procedure: CARDIOVERSION;  Surgeon: Josue Hector, MD;  Location: [facility]COPY;  Service: Cardiovascular;  Laterality: N/A;  ? CHOLECYSTECTOMY  early 48s  ? COLONOSCOPY    ? Diverticulosis, colon polyps, repeat 2014, Dr. Deatra Ina  ? COLONOSCOPY WITH PROPOFOL N/A 07/25/2019  ? Procedure: COLONOSCOPY WITH PROPOFOL;  Surgeon: Yetta Flock, MD;  [facility];  Service: Gastroenterology;  Laterality: N/A;  ? ESOPHAGOGASTRODUODENOSCOPY N/A 05/16/2014  ? Procedure: ESOPHAGOGASTRODUODENOSCOPY (EGD);  Surgeon: Wonda Horner, MD;  [facility];  Service: Gastroenterology;  Laterality: N/A;  ? HERNIA REPAIR  [id number]  ? umbilical hernia  ? MYOMECTOMY  2000  ? POLYPECTOMY  07/25/2019  ? Procedure: POLYPECTOMY;  Surgeon: Yetta Flock, MD;  [facility];  Service: Gastroenterology;;  ? ? ?Current Outpatient Medications  ?Medication Sig Dispense Refill  ? Accu-Chek FastClix Lancets MISC     ? allopurinol (ZYLOPRIM) 100 MG tablet TAKE 1/2 TABLET(50 MG) BY MOUTH TWICE DAILY 90 tablet 3  ? Blood Pressure KIT 1 kit by Does not apply route daily. Check blood pressure once a  day 1 kit 0  ? colchicine 0.6 MG tablet Take 0.6 mg by mouth daily as needed (gout).    ? dabigatran (PRADAXA) 150 MG CAPS capsule TAKE 1 CAPSULE BY MOUTH TWICE DAILY( EVERY TWELVE HOURS) 60 capsule 5  ? dronedarone (MULTAQ) 400 MG tablet Take 1 tablet (400 mg total) by mouth 2 (two) times daily. 180 tablet 3  ? furosemide (LASIX) 40 MG tablet Take 1 tablet (40 mg total) by mouth daily. 90 tablet 3  ? HYDROcodone-acetaminophen (NORCO/VICODIN) 5-325 MG tablet Take 1-2 tablets by mouth every 6 (six) hours as needed. 10 tablet 0  ? ibuprofen (ADVIL) 200 MG tablet Take 400 mg by mouth every 6 (six) hours as needed for headache or mild pain.     ? Insulin Glargine (BASAGLAR KWIKPEN) 100 UNIT/ML Inject 20 Units into the skin daily.    ?  Insulin Pen Needle (NOVOFINE) 30G X 8 MM MISC For Humalog meal time TID 100 each 11  ? JARDIANCE 25 MG TABS tablet Take 25 mg by mouth daily.    ? metoprolol succinate (TOPROL XL) 25 MG 24 hr tablet Take 1 tablet (25 mg total) by mouth daily. 90 tablet 3  ? NOVOLOG FLEXPEN 100 UNIT/ML FlexPen Inject 5 Units into the skin 3 (three) times daily with meals. (Patient taking differently: Inject 13 Units into the skin daily before supper.) 15 mL 0  ? ondansetron (ZOFRAN-ODT) 4 MG disintegrating tablet Take 1 tablet (4 mg total) by mouth every 8 (eight) hours as needed for nausea or vomiting. 20 tablet 0  ? sacubitril-valsartan (ENTRESTO) 24-26 MG Take 1 tablet by mouth 2 (two) times daily. 180 tablet 3  ? simvastatin (ZOCOR) 10 MG tablet Take 10 mg by mouth daily.     ? ?No current facility-administered medications for this encounter.  ? ? ?Allergies  ?Allergen Reactions  ? Amiodarone Other (See Comments)  ?  adverse reaction: Tremor/gait disurbance  ? Aspirin Other (See Comments)  ?  "Burns my stomach"  ? Methimazole Nausea And Vomiting  ? Propoxyphene Nausea And Vomiting  ? Albuterol Palpitations  ? Atorvastatin Other (See Comments)  ?  Body aches and pains--stiffness.   ? Livalo  [Pitavastatin]   ?  Body aches  ? ? ?Social History  ? ?Socioeconomic History  ? Marital status: Single  ?  Spouse name: Not on file  ? Number of children: 5  ? Years of education: college  ? Highest education level: Not on f

## 2022-02-25 ENCOUNTER — Other Ambulatory Visit: Payer: Self-pay

## 2022-02-25 ENCOUNTER — Encounter (HOSPITAL_COMMUNITY): Payer: Self-pay | Admitting: Emergency Medicine

## 2022-02-25 ENCOUNTER — Ambulatory Visit (HOSPITAL_COMMUNITY)
Admission: EM | Admit: 2022-02-25 | Discharge: 2022-02-25 | Disposition: A | Discharge status: Home / Self Care | Payer: Medicare Other | Attending: Family Medicine | Admitting: Family Medicine

## 2022-02-25 DIAGNOSIS — R238 Other skin changes: Secondary | ICD-10-CM

## 2022-02-25 DIAGNOSIS — T2101XA Burn of unspecified degree of chest wall, initial encounter: Secondary | ICD-10-CM

## 2022-02-25 DIAGNOSIS — T300 Burn of unspecified body region, unspecified degree: Secondary | ICD-10-CM

## 2022-02-25 MED ORDER — TRIAMCINOLONE ACETONIDE 0.1 % EX CREA: 1.0000 "application " | TOPICAL_CREAM | Freq: Two times a day (BID) | CUTANEOUS | 0 refills | Status: DC

## 2022-02-25 NOTE — ED Triage Notes (Signed)
Left chest with area of irritation.  Patient reports this is location where monitor was placed ?

## 2022-02-26 NOTE — ED Provider Notes (Signed)
?[place] ? ? ?[phone] ?02/25/22 Arrival Time: 1858 ? ?ASSESSMENT & PLAN: ? ?1. Skin irritation   ?2. Friction burn   ? ?No signs of skin infection. ?If needed: ?Meds ordered this encounter  ?Medications  ? triamcinolone cream (KENALOG) 0.1 %  ?  Sig: Apply 1 application. topically 2 (two) times daily.  ?  Dispense:  15 g  ?  Refill:  0  ? ?Will call cardiology to inquire about new heart monitor. ? ?Will follow up with PCP or here if worsening or failing to improve as anticipated. ?Reviewed expectations re: course of current medical issues. Questions answered. ?Outlined signs and symptoms indicating need for more acute intervention. ?Patient verbalized understanding. ?After Visit Summary given. ? ? ?SUBJECTIVE: ? ?Stephanie [name] is a 71 y.o. female who presents with a skin complaint. Reports friction burn on L upper chest. Reports skin was rubbed aggressively during heart monitor placement. Is painful. No bleeding or drainage. Otherwise well. ? ? ?OBJECTIVE: ?Vitals:  ? 02/25/22 2028 02/25/22 2029  ?BP:  (!) 157/90  ?Pulse: 91 91  ?Resp: 20 20  ?Temp:  98.2 ?F (36.8 ?C)  ?TempSrc:  Oral  ?SpO2: 100% 100%  ?  ?General appearance: alert; no distress ?Extremities: no edema; moves all extremities normally ?Skin: warm and dry; signs of infection: no; LEFT upper chest wall with approx 3x6 area of skin erythema present; slight warmth; TTP; no bleeding or drainage ?Psychological: alert and cooperative; normal mood and affect ? ?Allergies  ?Allergen Reactions  ? Amiodarone Other (See Comments)  ?  adverse reaction: Tremor/gait disurbance  ? Aspirin Other (See Comments)  ?  "Burns my stomach"  ? Methimazole Nausea And Vomiting  ? Propoxyphene Nausea And Vomiting  ? Albuterol Palpitations  ? Atorvastatin Other (See Comments)  ?  Body aches and pains--stiffness.   ? Livalo [Pitavastatin]   ?  Body aches  ? ? ?Past Medical History:  ?Diagnosis Date  ? Anemia   ? Ankle fracture, right   ? x 2  ? Antineoplastic and  immunosuppressive drugs causing adverse effect in therapeutic use   ? Asthma   ? Chronic venous hypertension without complications   ? Colon polyps 2009  ? Coronary artery disease   ? no CAD by cath 11.2010  ? Depressive disorder, not elsewhere classified   ? Diabetes mellitus type 2 in obese [facility])   ? hgb AIC 6.1 09/02/2009, insulin dependent   ? Diastolic CHF, chronic (HCC)   ? reports stable  ? Diverticulosis of colon 2009  ? colonoscopy  ? Gout   ? cortisone  shots helped  ? Hepatitis, unspecified   ? hx of/per pt, never was told of having hepatitis  ? Hx of hysterectomy 2002  ? Hyperlipidemia   ? Hypertension   ? controlled on medication   ? Hypothyroidism   ? reports her thryoid levels are normal now   ? Irritable bowel syndrome   ? Lung nodule   ? 54m RLL nodule on CT Chest  07/2009, resolved by 2011 CT  ? Noncompliance   ? Obesity   ? [place]istent [place]   ? s/p DCCV 07/2009; Pradaxa Rx, states she is in sinus rhythm now   ? PONV (postoperative nausea and vomiting)   ? after receiving "too much anesthesia" after a hernia surgery   ? Sarcoidosis   ? dx by eye exam; seen during eye exam , report asymtptomatic "i dont have it now'   ? Sleep disturbance  06/2013  ? sleep study, mild abnormality, no criteria for CPAP  ? ?Social History  ? ?Socioeconomic History  ? Marital status: Single  ?  Spouse name: Not on file  ? Number of children: 5  ? Years of education: college  ? Highest education level: Not on file  ?Occupational History  ? Occupation: Disabled.  ?Tobacco Use  ? Smoking status: Never  ? Smokeless tobacco: Never  ? Tobacco comments:  ?  Never smoke 01/27/22  ?Vaping Use  ? Vaping Use: Never used  ?Substance and Sexual Activity  ? Alcohol use: Yes  ?  Alcohol/week: 0.0 standard drinks  ?  Comment: rare  ? Drug use: No  ? Sexual activity: Not Currently  ?  Birth control/protection: Surgical  ?Other Topics Concern  ? Not on file  ?Social History Narrative  ? Pt lives in [place] with her  son.  Unemployed.  Right handed.    ? Education college  ? ?Social Determinants of Health  ? ?Financial Resource Strain: Not on file  ?Food Insecurity: Not on file  ?Transportation Needs: Not on file  ?Physical Activity: Not on file  ?Stress: Not on file  ?Social Connections: Not on file  ?Intimate Partner Violence: Not on file  ? ?Family History  ?Problem Relation Age of Onset  ? Pneumonia Father   ?     deceased  ? Hypertension Father   ? Cancer Father   ? Multiple sclerosis Mother   ?     hx  ? Emphysema Other   ?     runs in the family  ? ?Past Surgical History:  ?Procedure Laterality Date  ? ABDOMINAL HYSTERECTOMY    ? CARDIOVERSION    ? x 2  ? CARDIOVERSION N/A 02/27/2013  ? Procedure: CARDIOVERSION;  Surgeon: Lelon Perla, MD;  Location: [place];  Service: Cardiovascular;  Laterality: N/A;  ? CARDIOVERSION N/A 03/24/2017  ? Procedure: CARDIOVERSION;  Surgeon: Josue Hector, MD;  Location: [facility] ENDOSCOPY;  Service: Cardiovascular;  Laterality: N/A;  ? CARDIOVERSION N/A 03/04/2020  ? Procedure: CARDIOVERSION;  Surgeon: Josue Hector, MD;  Location: [facility]COPY;  Service: Cardiovascular;  Laterality: N/A;  ? CHOLECYSTECTOMY  early 59s  ? COLONOSCOPY    ? Diverticulosis, colon polyps, repeat 2014, Dr. Deatra Ina  ? COLONOSCOPY WITH PROPOFOL N/A 07/25/2019  ? Procedure: COLONOSCOPY WITH PROPOFOL;  Surgeon: Yetta Flock, MD;  [facility];  Service: Gastroenterology;  Laterality: N/A;  ? ESOPHAGOGASTRODUODENOSCOPY N/A 05/16/2014  ? Procedure: ESOPHAGOGASTRODUODENOSCOPY (EGD);  Surgeon: Wonda Horner, MD;  [facility];  Service: Gastroenterology;  Laterality: N/A;  ? HERNIA REPAIR  [id number]  ? umbilical hernia  ? MYOMECTOMY  2000  ? POLYPECTOMY  07/25/2019  ? Procedure: POLYPECTOMY;  Surgeon: Yetta Flock, MD;  [facility];  Service: Gastroenterology;;  ? ? ?  ?Vanessa Kick, MD ?02/26/22 [phone] ? ?

## 2022-02-27 ENCOUNTER — Telehealth: Payer: Self-pay

## 2022-02-27 NOTE — Telephone Encounter (Signed)
Spoke with pt who reports she was recently seen in Afib clinic by [facility] and a monitor was ordered and placed on pt in the office.  Pt states the person who placed the monitor cleaned/scrubbed the area until her skin was raw.  Pt ultimately was seen at [facility] because of this.  She has contacted Patient Experience about this. ?Pt states the monitor only stayed on a day before it fell off.  Pt advised will have our monitor tech contact her re: replacing the monitor.  Pt verbalizes understanding and agrees with current plan. ? ?

## 2022-02-27 NOTE — Telephone Encounter (Signed)
-----   Message from Judie Grieve sent at 02/26/2022  3:41 PM EDT ----- ?Regarding: Patient requesting call ?Good afternoon Rosann Auerbach, ? ?I know you are not in the office today, this patient would like a call from you as soon as possible tomorrow.   ? ?I confirmed the primary number. ? ?

## 2022-02-27 NOTE — Telephone Encounter (Addendum)
Returned call to patient. Monitor was applied at the afib clinic. I explained I was from the monitor department at the [address] office and then listened to the patiens concerns. Per patient the nurse who applied monitor said "I know you" repeatedly and patient feels she was intentionally scrubbed/cleaned harder which caused her to have skin irritation/burns. She understands that during the skin prep process for the monitor we are required to abrade the skin to get good contact. But feels like it was overdone. Patient also mentions her monitor fell off in less than 24 hrs. I offered to reach out to Dr [name] to see if he would like me to repeat a Zio monitor on her after her skin heals or potentially order a [employer] Long Term monitor with sensitive skin patches.  ?

## 2022-03-18 ENCOUNTER — Encounter: Payer: Self-pay | Admitting: Internal Medicine

## 2022-03-18 ENCOUNTER — Ambulatory Visit: Payer: Medicare Other | Admitting: Internal Medicine

## 2022-03-18 ENCOUNTER — Ambulatory Visit (INDEPENDENT_AMBULATORY_CARE_PROVIDER_SITE_OTHER): Payer: Medicare Other | Admitting: Internal Medicine

## 2022-03-18 VITALS — BP 132/86 | HR 77 | Ht 63.0 in | Wt 274.0 lb

## 2022-03-18 DIAGNOSIS — Z794 Long term (current) use of insulin: Secondary | ICD-10-CM

## 2022-03-18 DIAGNOSIS — F322 Major depressive disorder, single episode, severe without psychotic features: Secondary | ICD-10-CM | POA: Insufficient documentation

## 2022-03-18 DIAGNOSIS — I10 Essential (primary) hypertension: Secondary | ICD-10-CM | POA: Diagnosis not present

## 2022-03-18 DIAGNOSIS — F411 Generalized anxiety disorder: Secondary | ICD-10-CM | POA: Diagnosis not present

## 2022-03-18 DIAGNOSIS — E119 Type 2 diabetes mellitus without complications: Secondary | ICD-10-CM

## 2022-03-18 DIAGNOSIS — Z0001 Encounter for general adult medical examination with abnormal findings: Secondary | ICD-10-CM

## 2022-03-18 DIAGNOSIS — E538 Deficiency of other specified B group vitamins: Secondary | ICD-10-CM | POA: Diagnosis not present

## 2022-03-18 DIAGNOSIS — Z1231 Encounter for screening mammogram for malignant neoplasm of breast: Secondary | ICD-10-CM

## 2022-03-18 DIAGNOSIS — Z23 Encounter for immunization: Secondary | ICD-10-CM | POA: Insufficient documentation

## 2022-03-18 DIAGNOSIS — E1165 Type 2 diabetes mellitus with hyperglycemia: Secondary | ICD-10-CM | POA: Insufficient documentation

## 2022-03-18 LAB — CBC WITH DIFFERENTIAL/PLATELET
Basophils Absolute: 0 10*3/uL (ref 0.0–0.1)
Basophils Relative: 0.5 % (ref 0.0–3.0)
Eosinophils Absolute: 0.1 10*3/uL (ref 0.0–0.7)
Eosinophils Relative: 2.6 % (ref 0.0–5.0)
HCT: 36.9 % (ref 36.0–46.0)
Hemoglobin: 12 g/dL (ref 12.0–15.0)
Lymphocytes Relative: 44.3 % (ref 12.0–46.0)
Lymphs Abs: 2.3 10*3/uL (ref 0.7–4.0)
MCHC: 32.6 g/dL (ref 30.0–36.0)
MCV: 91.8 fl (ref 78.0–100.0)
Monocytes Absolute: 0.4 10*3/uL (ref 0.1–1.0)
Monocytes Relative: 7.4 % (ref 3.0–12.0)
Neutro Abs: 2.3 10*3/uL (ref 1.4–7.7)
Neutrophils Relative %: 45.2 % (ref 43.0–77.0)
Platelets: 201 10*3/uL (ref 150.0–400.0)
RBC: 4.02 Mil/uL (ref 3.87–5.11)
RDW: 15.2 % (ref 11.5–15.5)
WBC: 5.2 10*3/uL (ref 4.0–10.5)

## 2022-03-18 LAB — FOLATE: Folate: 9.4 ng/mL (ref >5.9)

## 2022-03-18 LAB — TSH: TSH: 2.78 u[IU]/mL (ref 0.35–5.50)

## 2022-03-18 LAB — VITAMIN B12: Vitamin B-12: 206 pg/mL — ABNORMAL LOW (ref 211–911)

## 2022-03-18 MED ORDER — ALPRAZOLAM 0.5 MG PO TABS
0.5000 mg | ORAL_TABLET | Freq: Three times a day (TID) | ORAL | 0 refills | Status: DC | PRN
Start: 2022-03-18 — End: 2023-04-19

## 2022-03-18 MED ORDER — VORTIOXETINE HBR 5 MG PO TABS: 5.0000 mg | ORAL_TABLET | Freq: Every day | ORAL | 0 refills | Status: DC

## 2022-03-18 NOTE — Progress Notes (Unsigned)
 Subjective:  Patient ID: Stephanie [name], female    DOB: 03/28/1951  Age: 71 y.o. MRN: [id number]  CC: Hypertension, Diabetes, Annual Exam, and Depression   HPI Stephanie [name] presents for a CPX and to establish.  She complains of a several month history of anxiety, panicky sensation, insomnia, crying spells, feeling angry and irritable, having a lack of focus, and feeling hopeless and helpless.  She denies SI or HI.  She requests a refill of Xanax.  History Stephanie [name] has a past medical history of Anemia, Ankle fracture, right, Antineoplastic and immunosuppressive drugs causing adverse effect in therapeutic use, Asthma, Chronic venous hypertension without complications, Colon polyps (2009), Coronary artery disease, Depressive disorder, not elsewhere classified, Diabetes mellitus type 2 in obese (HCC), Diastolic CHF, chronic (HCC), Diverticulosis of colon (2009), Gout, Hepatitis, unspecified, hysterectomy (2002), Hyperlipidemia, Hypertension, Hypothyroidism, Irritable bowel syndrome, Lung nodule, Noncompliance, Obesity, Persistent atrial fibrillation (HCC), PONV (postoperative nausea and vomiting), Sarcoidosis, and Sleep disturbance (06/2013).   She has a past surgical history that includes Cholecystectomy (early 80s); Cardioversion; Hernia repair (2009); Abdominal hysterectomy; Colonoscopy; Cardioversion (N/A, 02/27/2013); Esophagogastroduodenoscopy (N/A, 05/16/2014); Cardioversion (N/A, 03/24/2017); Myomectomy (2000); Colonoscopy with propofol (N/A, 07/25/2019); polypectomy (07/25/2019); and Cardioversion (N/A, 03/04/2020).   Her family history includes Cancer in her father; Emphysema in an other family member; Hypertension in her father; Multiple sclerosis in her mother; Pneumonia in her father.She reports that she has never smoked. She has never used smokeless tobacco. She reports current alcohol use. She reports that she does not use drugs.  Outpatient Medications Prior to Visit  Medication Sig  Dispense Refill   Accu-Chek FastClix Lancets MISC      allopurinol (ZYLOPRIM) 100 MG tablet TAKE 1/2 TABLET(50 MG) BY MOUTH TWICE DAILY 90 tablet 3   Blood Pressure KIT 1 kit by Does not apply route daily. Check blood pressure once a day 1 kit 0   colchicine 0.6 MG tablet Take 0.6 mg by mouth daily as needed (gout).     dabigatran (PRADAXA) 150 MG CAPS capsule TAKE 1 CAPSULE BY MOUTH TWICE DAILY( EVERY TWELVE HOURS) 60 capsule 5   dronedarone (MULTAQ) 400 MG tablet Take 1 tablet (400 mg total) by mouth 2 (two) times daily. 180 tablet 3   furosemide (LASIX) 40 MG tablet Take 1 tablet (40 mg total) by mouth daily. 90 tablet 3   Insulin Glargine (BASAGLAR KWIKPEN) 100 UNIT/ML Inject 20 Units into the skin daily.     JARDIANCE 25 MG TABS tablet Take 25 mg by mouth daily.     metoprolol succinate (TOPROL XL) 25 MG 24 hr tablet Take 1 tablet (25 mg total) by mouth daily. 90 tablet 3   NOVOLOG FLEXPEN 100 UNIT/ML FlexPen Inject 5 Units into the skin 3 (three) times daily with meals. (Patient taking differently: Inject 13 Units into the skin daily before supper.) 15 mL 0   sacubitril-valsartan (ENTRESTO) 24-26 MG Take 1 tablet by mouth 2 (two) times daily. 180 tablet 3   simvastatin (ZOCOR) 10 MG tablet Take 10 mg by mouth daily.      ibuprofen (ADVIL) 200 MG tablet Take 400 mg by mouth every 6 (six) hours as needed for headache or mild pain.      Insulin Pen Needle (NOVOFINE) 30G X 8 MM MISC For Humalog meal time TID 100 each 11   HYDROcodone-acetaminophen (NORCO/VICODIN) 5-325 MG tablet Take 1-2 tablets by mouth every 6 (six) hours as needed. 10 tablet 0   ondansetron (ZOFRAN-ODT) 4 MG disintegrating tablet Take 1   tablet (4 mg total) by mouth every 8 (eight) hours as needed for nausea or vomiting. 20 tablet 0   triamcinolone cream (KENALOG) 0.1 % Apply 1 application. topically 2 (two) times daily. 15 g 0   No facility-administered medications prior to visit.    ROS Review of Systems   Constitutional:  Positive for fatigue. Negative for diaphoresis and unexpected weight change.  HENT: Negative.    Eyes: Negative.   Respiratory:  Negative for cough, chest tightness, shortness of breath and wheezing.   Cardiovascular:  Negative for chest pain, palpitations and leg swelling.  Gastrointestinal:  Negative for abdominal pain, constipation, diarrhea, nausea and vomiting.  Endocrine: Negative.   Genitourinary: Negative.  Negative for difficulty urinating.  Musculoskeletal: Negative.  Negative for myalgias.  Skin: Negative.  Negative for color change.  Neurological:  Negative for dizziness, weakness and light-headedness.  Hematological:  Negative for adenopathy. Does not bruise/bleed easily.  Psychiatric/Behavioral:  Positive for decreased concentration, dysphoric mood and sleep disturbance. Negative for agitation, behavioral problems, confusion, hallucinations, self-injury and suicidal ideas. The patient is nervous/anxious. The patient is not hyperactive.    Objective:  BP 132/86 (BP Location: Right Arm, Patient Position: Sitting, Cuff Size: Large)   Pulse 77   Ht 5' 3" (1.6 m)   Wt 274 lb (124.3 kg)   SpO2 96%   BMI 48.54 kg/m   Physical Exam Vitals reviewed.  HENT:     Nose: Nose normal.     Mouth/Throat:     Mouth: Mucous membranes are moist.  Eyes:     General: No scleral icterus.    Conjunctiva/sclera: Conjunctivae normal.  Cardiovascular:     Rate and Rhythm: Normal rate and regular rhythm.     Heart sounds: No murmur heard. Pulmonary:     Effort: Pulmonary effort is normal.     Breath sounds: No stridor. No wheezing, rhonchi or rales.  Abdominal:     General: Abdomen is protuberant.     Palpations: There is no mass.     Tenderness: There is no abdominal tenderness. There is no guarding.     Hernia: No hernia is present.  Musculoskeletal:        General: Normal range of motion.     Cervical back: Neck supple.     Right lower leg: No edema.     Left  lower leg: No edema.  Lymphadenopathy:     Cervical: No cervical adenopathy.  Skin:    General: Skin is warm and dry.  Neurological:     General: No focal deficit present.     Mental Status: She is alert.  Psychiatric:        Attention and Perception: Perception normal. She is inattentive.        Mood and Affect: Mood is anxious and depressed. Affect is flat and tearful. Affect is not blunt.        Speech: Speech normal. She is communicative. Speech is not rapid and pressured, delayed or tangential.        Behavior: Behavior normal.        Thought Content: Thought content normal. Thought content is not paranoid or delusional. Thought content does not include homicidal or suicidal ideation.        Cognition and Memory: Cognition normal.        Judgment: Judgment normal.    Lab Results  Component Value Date   WBC 5.2 03/18/2022   HGB 12.0 03/18/2022   HCT 36.9 03/18/2022   PLT 201.0  03/18/2022   GLUCOSE 103 (H) 01/26/2022   CHOL 179 02/12/2022   TRIG 129 02/12/2022   HDL 55 02/12/2022   LDLCALC 101 02/12/2022   ALT 11 01/26/2022   AST 30 01/26/2022   NA 139 01/26/2022   K 5.0 01/26/2022   CL 106 01/26/2022   CREATININE 1.57 (H) 01/26/2022   BUN 21 01/26/2022   CO2 27 01/26/2022   TSH 2.78 03/18/2022   INR 1.2 (H) 04/05/2013   HGBA1C 6.8 02/12/2022   MICROALBUR 14.13 02/12/2022     Assessment & Plan:   Stephanie [name] was seen today for hypertension, diabetes, annual exam and depression.  Diagnoses and all orders for this visit:  GAD (generalized anxiety disorder) -     TSH; Future -     AMB Referral to [facility] -     TSH -     ALPRAZolam (XANAX) 0.5 MG tablet; Take 1 tablet (0.5 mg total) by mouth 3 (three) times daily as needed for anxiety.  Current severe episode of major depressive disorder without psychotic features without prior episode (HCC) -     vortioxetine HBr (TRINTELLIX) 5 MG TABS tablet; Take 1 tablet (5 mg total) by mouth daily. -      TSH; Future -     AMB Referral to [facility] -     TSH  Essential hypertension- Her blood pressure is adequately well controlled. -     CBC with Differential/Platelet; Future -     TSH; Future -     TSH -     CBC with Differential/Platelet  Insulin-requiring or dependent type II diabetes mellitus ([place])- Her blood sugar is adequately well controlled. -     HM Diabetes Foot Exam  B12 deficiency- I have asked her to start parenteral B12 replacement therapy. -     CBC with Differential/Platelet; Future -     Vitamin B12; Future -     Folate; Future -     Folate -     Vitamin B12 -     CBC with Differential/Platelet  Need for vaccination -     Pneumococcal conjugate vaccine 20-valent  Visit for screening mammogram -     MM DIGITAL SCREENING BILATERAL; Future  Encounter for general adult medical examination with abnormal findings- Exam completed, labs reviewed, vaccines reviewed and updated, cancer screenings addressed, patient education material was given.   I have discontinued Edwena Felty Plascencia's ibuprofen, ondansetron, HYDROcodone-acetaminophen, and triamcinolone cream. I am also having her start on vortioxetine HBr and ALPRAZolam. Additionally, I am having her maintain her Insulin Pen Needle, simvastatin, NovoLOG FlexPen, Basaglar KwikPen, Accu-Chek FastClix Lancets, Blood Pressure, furosemide, Entresto, metoprolol succinate, Multaq, allopurinol, Jardiance, colchicine, and dabigatran.  Meds ordered this encounter  Medications   vortioxetine HBr (TRINTELLIX) 5 MG TABS tablet    Sig: Take 1 tablet (5 mg total) by mouth daily.    Dispense:  30 tablet    Refill:  0   ALPRAZolam (XANAX) 0.5 MG tablet    Sig: Take 1 tablet (0.5 mg total) by mouth 3 (three) times daily as needed for anxiety.    Dispense:  270 tablet    Refill:  0     Follow-up: Return in about 3 months (around 06/18/2022).  Scarlette Calico, MD

## 2022-03-18 NOTE — Patient Instructions (Signed)
Major Depressive Disorder, Adult Major depressive disorder (MDD) is a mental health condition. It may also be called clinical depression or unipolar depression. MDD causes symptoms of sadness, hopelessness, and loss of interest in things. These symptoms last most of the day, almost every day, for 2 weeks. MDD can also cause physical symptoms. It can interfere with relationships and with everyday activities, such as work, school, and activities that are usually pleasant. MDD may be mild, moderate, or severe. It may be single-episode MDD, which happens once, or recurrent MDD, which may occur multiple times. What are the causes? The exact cause of this condition is not known. MDD is most likely caused by a combination of things, which may include: Your personality traits. Learned or conditioned behaviors or thoughts or feelings that reinforce negativity. Any alcohol or substance misuse. Long-term (chronic) physical or mental health illness. Going through a traumatic experience or major life changes. What increases the risk? The following factors may make someone more likely to develop MDD: A family history of depression. Being a woman. Troubled family relationships. Abnormally low levels of certain brain chemicals. Traumatic or painful events in childhood, especially abuse or loss of a parent. A lot of stress from life experiences, such as poor living conditions or discrimination. Chronic physical illness or other mental health disorders. What are the signs or symptoms? The main symptoms of MDD usually include: Constant depressed or irritable mood. A loss of interest in things and activities. Other symptoms include: Sleeping or eating too much or too little. Unexplained weight gain or weight loss. Tiredness or low energy. Being agitated, restless, or weak. Feeling hopeless, worthless, or guilty. Trouble thinking clearly or making decisions. Thoughts of suicide or thoughts of harming  others. Isolating oneself or avoiding other people or activities. Trouble completing tasks, work, or any normal obligations. Severe symptoms of this condition may include: Psychotic depression.This may include false beliefs, or delusions. It may also include seeing, hearing, tasting, smelling, or feeling things that are not real (hallucinations). Chronic depression or persistent depressive disorder. This is low-level depression that lasts for at least 2 years. Melancholic depression, or feeling extremely sad and hopeless. Catatonic depression, which includes trouble speaking and trouble moving. How is this diagnosed? This condition may be diagnosed based on: Your symptoms. Your medical and mental health history. You may be asked questions about your lifestyle, including any drug and alcohol use. A physical exam. Blood tests to rule out other conditions. MDD is confirmed if you have the following symptoms most of the day, nearly every day, in a 2-week period: Either a depressed mood or loss of interest. At least four other MDD symptoms. How is this treated? This condition is usually treated by mental health professionals, such as psychologists, psychiatrists, and clinical social workers. You may need more than one type of treatment. Treatment may include: Psychotherapy, also called talk therapy or counseling. Types of psychotherapy include: Cognitive behavioral therapy (CBT). This teaches you to recognize unhealthy feelings, thoughts, and behaviors, and replace them with positive thoughts and actions. Interpersonal therapy (IPT). This helps you to improve the way you communicate with others or relate to them. Family therapy. This treatment includes members of your family. Medicines to treat anxiety and depression. These medicines help to balance the brain chemicals that affect your emotions. Lifestyle changes. You may be asked to: Limit alcohol use and avoid drug use. Get regular  exercise. Get plenty of sleep. Make healthy eating choices. Spend more time outdoors. Brain stimulation. This may   be done if symptoms are very severe and other treatments have not worked. Examples of this treatment are electroconvulsive therapy and transcranial magnetic stimulation. Follow these instructions at home: Activity Exercise regularly and spend time outdoors. Find activities that you enjoy doing, and make time to do them. Find healthy ways to manage stress, such as: Meditation or deep breathing. Spending time in nature. Journaling. Return to your normal activities as told by your health care provider. Ask your health care provider what activities are safe for you. Alcohol and drug use If you drink alcohol: Limit how much you use to: 0-1 drink a day for women who are not pregnant. 0-2 drinks a day for men. Be aware of how much alcohol is in your drink. In the [place] one drink equals one 12 oz bottle of beer (355 mL), one 5 oz glass of wine (148 mL), or one 1 oz glass of hard liquor (44 mL). Discuss your alcohol use with your health care provider. Alcohol can affect any antidepressant medicines you are taking. Discuss any drug use with your health care provider. General instructions  Take over-the-counter and prescription medicines only as told by your health care provider. Eat a healthy diet and get plenty of sleep. Consider joining a support group. Your health care provider may be able to recommend one. Keep all follow-up visits as told by your health care provider. This is important. Where to find more information National Alliance on Mental Illness: www.nami.org [place] National Institute of Mental Health: www.nimh.nih.gov Contact a health care provider if: Your symptoms get worse. You develop new symptoms. Get help right away if: You self-harm. You have serious thoughts about hurting yourself or others. You hallucinate. If you ever feel like you may hurt yourself or  others, or have thoughts about taking your own life, get help right away. Go to your nearest emergency department or: Call your local emergency services (911 in the [place] Call a suicide crisis helpline, such as the National Suicide Prevention Lifeline at 1-800-273-8255 or 988 in the [place] This is open 24 hours a day in the [place] Text the Crisis Text Line at 741741 (in the [place] Summary Major depressive disorder (MDD) is a mental health condition. MDD causes symptoms of sadness, hopelessness, and loss of interest in things. These symptoms last most of the day, almost every day, for 2 weeks. The symptoms of MDD can interfere with relationships and with everyday activities. Treatments and support are available for people who develop MDD. You may need more than one type of treatment. Get help right away if you have serious thoughts about hurting yourself or others. This information is not intended to replace advice given to you by your health care provider. Make sure you discuss any questions you have with your health care provider. Document Revised: 04/30/2021 Document Reviewed: 09/16/2019 Elsevier Patient Education  2023 Elsevier Inc.  

## 2022-03-19 ENCOUNTER — Telehealth: Payer: Self-pay | Admitting: *Deleted

## 2022-03-19 NOTE — Chronic Care Management (AMB) (Signed)
  Chronic Care Management   Note  03/19/2022 Name: Stephanie [name] MRN: [id number] DOB: 07/08/1951  [name] is a 71 y.o. year old female who is a primary care patient of [name], MD. I reached out to [name] by phone today in response to a referral sent by [name] PCP.  [name] was given information about Chronic Care Management services today including:  CCM service includes personalized support from designated clinical staff supervised by her physician, including individualized plan of care and coordination with other care providers 24/7 contact phone numbers for assistance for urgent and routine care needs. Service will only be billed when office clinical staff spend 20 minutes or more in a month to coordinate care. Only one practitioner may furnish and bill the service in a calendar month. The patient may stop CCM services at any time (effective at the end of the month) by phone call to the office staff. The patient is responsible for co-pay (up to 20% after annual deductible is met) if co-pay is required by the individual health plan.   Patient agreed to services and verbal consent obtained.   Follow up plan: Telephone appointment with care management team member scheduled for:03/30/22  [facility]  Direct Dial: [phone]

## 2022-03-21 DIAGNOSIS — Z0001 Encounter for general adult medical examination with abnormal findings: Secondary | ICD-10-CM | POA: Insufficient documentation

## 2022-03-23 ENCOUNTER — Ambulatory Visit (INDEPENDENT_AMBULATORY_CARE_PROVIDER_SITE_OTHER): Payer: Medicare Other

## 2022-03-23 DIAGNOSIS — E538 Deficiency of other specified B group vitamins: Secondary | ICD-10-CM

## 2022-03-23 MED ORDER — CYANOCOBALAMIN 1000 MCG/ML IJ SOLN
1000.0000 ug | Freq: Once | INTRAMUSCULAR | Status: AC
  Administered 2022-03-23: 1000 ug via INTRAMUSCULAR

## 2022-03-23 NOTE — Progress Notes (Signed)
After obtaining consent, and per orders of Dr. Jones, injection of B12 given by Rayder Sullenger. Patient tolerated injection well in left deltoid and instructed to report any adverse reaction to me immediately.  

## 2022-03-23 NOTE — Progress Notes (Deleted)
After obtaining consent, and per orders of Dr. Jones, injection of B12 given by Mairin Lindsley. Patient tolerated injection well in left deltoid and instructed to report any adverse reaction to me immediately.  

## 2022-03-30 ENCOUNTER — Encounter: Payer: Self-pay | Admitting: Licensed Clinical Social Worker

## 2022-03-30 ENCOUNTER — Ambulatory Visit (INDEPENDENT_AMBULATORY_CARE_PROVIDER_SITE_OTHER): Payer: Medicare Other | Admitting: Licensed Clinical Social Worker

## 2022-03-30 DIAGNOSIS — F322 Major depressive disorder, single episode, severe without psychotic features: Secondary | ICD-10-CM

## 2022-03-30 DIAGNOSIS — F411 Generalized anxiety disorder: Secondary | ICD-10-CM

## 2022-03-30 NOTE — Patient Instructions (Signed)
Visit Information   Thank you for taking time to visit with me today. Please don't hesitate to contact me if I can be of assistance to you before our next scheduled telephone appointment.  Following are the goals we discussed today:  decreasing stress, as well as symptoms of anxiety and depression   Our next appointment is by telephone on June 19th at 8:30  Please call the care guide team at [phone] if you need to cancel or reschedule your appointment.   If you are experiencing a Mental Health or [place] or need someone to talk to, please call the Suicide and Crisis Lifeline: 988 call 1-800-273-TALK (toll free, 24 hour hotline) go to [facility] [address], [place] [phone])   Following is a copy of your full care plan:  Care Plan : LCSW Plan of Care  Updates made by Maurine Cane, LCSW since 03/30/2022 12:00 AM     Problem: Coping Skills      Goal: Coping Skills Enhanced   Start Date: 03/30/2022  This Visit's Progress: On track  Priority: High  Note:   Current Barriers:  Disease Management support and education needs related to Depression: anxiety  CSW Clinical Goal(s):  patient will work with SW to address concerns related to managing symptoms of stress, anxiety and depression  through collaboration with [facility] representative, provider, and care team.   Interventions: 1:1 collaboration with primary care provider regarding development and update of comprehensive plan of care as evidenced by provider attestation and co-signature Inter-disciplinary care team collaboration (see longitudinal plan of care) Evaluation of current treatment plan related to  self management and patient's adherence to plan as established by provider  Mental Health:  (Status: New goal.) Evaluation of current treatment plan related to  Stress, anxiety and depression Depression screen reviewed  Solution-Focused Strategies employed:   Active listening / Reflection utilized  Problem [place] strategies reviewed Provided psychoeducation for mental health needs  Reviewed mental health medications and discussed importance of compliance: only took Trintellix one but will start taking it daily Review therapy referral process co-pay, etc Provided EMMI education information on Depression  Task & activities to accomplish goals: Call Department of Social Services [phone] to inquire about Medicaid Transportation  Call Medicare to see if you have a co-pay for therapy  Review your EMMI educational information (on Depression ) Look for an e-mail from [employer].      Consent to CCM Services: Ms. Fair was given information about Chronic Care Management services including:  CCM service includes personalized support from designated clinical staff supervised by her physician, including individualized plan of care and coordination with other care providers 24/7 contact phone numbers for assistance for urgent and routine care needs. Service will only be billed when office clinical staff spend 20 minutes or more in a month to coordinate care. Only one practitioner may furnish and bill the service in a calendar month. The patient may stop CCM services at any time (effective at the end of the month) by phone call to the office staff. The patient will be responsible for cost sharing (co-pay) of up to 20% of the service fee (after annual deductible is met).  Patient agreed to services and verbal consent obtained.   The patient verbalized understanding of instructions, educational materials, and care plan provided today and agreed to receive a mailed copy of patient instructions, educational materials, and care plan.   Stephanie Lanius, LCSW  Licensed Clinical Social Worker [employer] [facility] [place]  [phone]

## 2022-03-30 NOTE — Chronic Care Management (AMB) (Signed)
Chronic Care Management   Clinical Social Work Note  03/30/2022 Name: Kyiah Canepa MRN: [id number] DOB: 04/08/1951  Surabhi Gadea is a 71 y.o. year old female who is a primary care patient of [name], MD. The CCM team was consulted to assist the patient with chronic disease management and/or care coordination needs related to: Mental Health Counseling and Resources.   Engaged with patient by telephone for initial visit in response to provider referral for social work chronic care management and care coordination services.   Consent to Services:  The patient was given the following information about Chronic Care Management services today, agreed to services, and gave verbal consent: 1. CCM service includes personalized support from designated clinical staff supervised by the primary care provider, including individualized plan of care and coordination with other care providers 2. 24/7 contact phone numbers for assistance for urgent and routine care needs. 3. Service will only be billed when office clinical staff spend 20 minutes or more in a month to coordinate care. 4. Only one practitioner may furnish and bill the service in a calendar month. 5.The patient may stop CCM services at any time (effective at the end of the month) by phone call to the office staff. 6. The patient will be responsible for cost sharing (co-pay) of up to 20% of the service fee (after annual deductible is met). Patient agreed to services and consent obtained.  Patient agreed to services and consent obtained.   Summary: Assessed patient's previous and current treatment, coping skills, support system and barriers to care. She is currently experiencing symptoms of  anxiety and depression which seems to be exacerbated by financial and other stressors. Reports she is feeling a little better since taking xanax, doesn't feel like everything is closing in on her and heart is not raising as much..Only Trintellix only once  but will start taking it daily. Education provided on importance of taking medication at prescribed.  See Care Plan below for interventions and patient self-care actives.  Recommendation: Patient may benefit from, but is a little ambivalent about starting psychotherapy at this time but willing to consider with in the week or so. (Concern with additional co-pay.  Follow up Plan:  Patient would like continued follow-up from CCM LCSW.  per patient's request will follow up in 1 week.  Will call office if needed prior to next encounter.   Assessment: Review of patient past medical history, allergies, medications, and health status, including review of relevant consultants reports was performed today as part of a comprehensive evaluation and provision of chronic care management and care coordination services.     SDOH (Social Determinants of Health) assessments and interventions performed:  SDOH Interventions    Flowsheet Row Most Recent Value  SDOH Interventions   Food Insecurity Interventions Intervention Not Indicated  Stress Interventions Provide Counseling, Other (Comment)  Transportation Interventions [place]  [currently uses [place] currently discussed Medicaid Transportation]        Advanced Directives Status: Not addressed in this encounter.  CCM Care Plan Conditions to be addressed/monitored: Anxiety and Depression;   Care Plan : LCSW Plan of Care  Updates made by Maurine Cane, LCSW since 03/30/2022 12:00 AM     Problem: Coping Skills      Goal: Coping Skills Enhanced   Start Date: 03/30/2022  This Visit's Progress: On track  Priority: High  Note:   Current Barriers:  Disease Management support and education needs related to Depression: anxiety  CSW  Clinical Goal(s):  patient will work with SW to address concerns related to managing symptoms of stress, anxiety and depression  through collaboration with [employer] representative,  provider, and care team.   Interventions: 1:1 collaboration with primary care provider regarding development and update of comprehensive plan of care as evidenced by provider attestation and co-signature Inter-disciplinary care team collaboration (see longitudinal plan of care) Evaluation of current treatment plan related to  self management and patient's adherence to plan as established by provider  Mental Health:  (Status: New goal.) Evaluation of current treatment plan related to  Stress, anxiety and depression Depression screen reviewed  Solution-Focused Strategies employed:  Active listening / Reflection utilized  Problem [place] strategies reviewed Provided psychoeducation for mental health needs  Reviewed mental health medications and discussed importance of compliance: only took Trintellix one but will start taking it daily Review therapy referral process co-pay, etc Provided EMMI education information on Depression  Task & activities to accomplish goals: Call Department of Social Services [phone] to inquire about Medicaid Transportation  Call Medicare to see if you have a co-pay for therapy  Review your EMMI educational information (on Depression ) Look for an e-mail from [employer].      Casimer Lanius, LCSW Licensed Clinical Social Worker [facility]  [place]  [phone]

## 2022-04-06 ENCOUNTER — Encounter: Payer: Self-pay | Admitting: Licensed Clinical Social Worker

## 2022-04-06 ENCOUNTER — Ambulatory Visit: Payer: Medicare Other | Admitting: Licensed Clinical Social Worker

## 2022-04-06 DIAGNOSIS — F411 Generalized anxiety disorder: Secondary | ICD-10-CM

## 2022-04-06 DIAGNOSIS — F322 Major depressive disorder, single episode, severe without psychotic features: Secondary | ICD-10-CM

## 2022-04-06 NOTE — Chronic Care Management (AMB) (Signed)
Chronic Care Management   Clinical Social Work Note  04/06/2022 Name: Stephanie [name] MRN: [id number] DOB: 1950/10/30  Stephanie [name] is a 71 y.o. year old female who is a primary care patient of [name], MD. The CCM team was consulted to assist the patient with chronic disease management and/or care coordination needs related to: Mental Health Counseling and Resources.   Engaged with patient by telephone for follow up visit in response to provider referral for social work chronic care management and care coordination services.   Consent to Services:  The patient was given information about Chronic Care Management services, agreed to services, and gave verbal consent prior to initiation of services.  Please see initial visit note for detailed documentation.   Patient agreed to services and consent obtained.   Summary: Assessed patient's current treatment, progress, coping skills, support system and barriers to care.  She is making progress, reports starting to feel less anxious, not crying. Has started taking medication as prescribed by PCP to manage symptoms of depression. Patient continues to have family stressor and crisis.  She has also completed a few task on her list of getting Medicaid transportation.    .  See Care Plan below for interventions and patient self-care actives.  Recommendation: Patient may benefit from, and is in agreement to contact Medicaid to inquire about co-pays for theray.   Follow up Plan: Patient would like continued follow-up from CCM LCSW.  per patient's request will follow up in 1 week.  Will call office if needed prior to next encounter.   Assessment: Review of patient past medical history, allergies, medications, and health status, including review of relevant consultants reports was performed today as part of a comprehensive evaluation and provision of chronic care management and care coordination services.     SDOH (Social Determinants of Health)  assessments and interventions performed:    Advanced Directives Status: Not addressed in this encounter.  CCM Care Plan Conditions to be addressed/monitored: Anxiety and Depression;   Care Plan : LCSW Plan of Care  Updates made by Maurine Cane, LCSW since 04/06/2022 12:00 AM     Problem: Coping Skills      Goal: Coping Skills Enhanced   Start Date: 03/30/2022  This Visit's Progress: On track  Recent Progress: On track  Priority: High  Note:   Current Barriers:  Disease Management support and education needs related to Depression: anxiety  CSW Clinical Goal(s):  patient will work with SW to address concerns related to managing symptoms of stress, anxiety and depression  through collaboration with [place] representative, provider, and care team.   Interventions: 1:1 collaboration with primary care provider regarding development and update of comprehensive plan of care as evidenced by provider attestation and co-signature Inter-disciplinary care team collaboration (see longitudinal plan of care) Evaluation of current treatment plan related to  self management and patient's adherence to plan as established by provider  Mental Health:  (Status: Goal on Track (progressing): YES.) Evaluation of current treatment plan related to  Stress, anxiety and depression Solution-Focused Strategies employed:  Active listening / Reflection utilized  Problem [place] strategies reviewed Provided psychoeducation for mental health needs  Reviewed mental health medications and discussed importance of compliance: takingTrintellix daily Review therapy referral process co-pay, etc Provided EMMI education information on Depression Patient contacted DSS and is now set up for Medicaid Transportation   Task & activities to accomplish goals: Continue with compliance of taking medication prescribed by Doctor Call Medicare to  see if you have a co-pay for therapy  Review your EMMI educational  information (on Depression ) Look for an e-mail from [employer] [facility].      Casimer Lanius, LCSW Licensed Clinical Social Worker [facility]  Stephanie [name]  [phone]

## 2022-04-06 NOTE — Patient Instructions (Signed)
Visit Information  Thank you for taking time to visit with me today. Please don't hesitate to contact me if I can be of assistance to you before our next scheduled telephone appointment.  Following are the goals we discussed today:  Task & activities to accomplish goals: Continue with compliance of taking medication prescribed by Doctor Call Medicare to see if you have a co-pay for therapy  Review your EMMI educational information (on Depression ) Look for an e-mail from [place].       Casimer Lanius, LCSW Licensed Clinical Social Worker [facility]  [place]  [phone]   Our next appointment is by telephone on June 26th at 8:30  Please call the care guide team at [phone] if you need to cancel or reschedule your appointment.   If you are experiencing a Mental Health or [place] or need someone to talk to, please call 1-800-273-TALK (toll free, 24 hour hotline)   The patient verbalized understanding of instructions, educational materials, and care plan provided today and agreed to receive a mailed copy of patient instructions, educational materials, and care plan.

## 2022-04-13 ENCOUNTER — Ambulatory Visit: Payer: Medicare Other | Admitting: Licensed Clinical Social Worker

## 2022-04-13 ENCOUNTER — Encounter: Payer: Self-pay | Admitting: Licensed Clinical Social Worker

## 2022-04-13 DIAGNOSIS — F411 Generalized anxiety disorder: Secondary | ICD-10-CM

## 2022-04-13 DIAGNOSIS — F322 Major depressive disorder, single episode, severe without psychotic features: Secondary | ICD-10-CM

## 2022-04-13 NOTE — Chronic Care Management (AMB) (Signed)
Chronic Care Management   Clinical Social Work Note  04/13/2022 Name: Stephanie [name] MRN: [id number] DOB: 10/22/1950  Stephanie [name] is a 71 y.o. year old female who is a primary care patient of [name], MD. The CCM team was consulted to assist the patient with chronic disease management and/or care coordination needs related to: Mental Health Counseling and Resources.   Engaged with patient by telephone for follow up visit in response to provider referral for social work chronic care management and care coordination services.   Consent to Services:  The patient was given information about Chronic Care Management services, agreed to services, and gave verbal consent prior to initiation of services.  Please see initial visit note for detailed documentation.   Patient agreed to services and consent obtained.   Summary:  Patient is making progress with decreasing symptoms of anxiety and depression. This is evident by decrease in PHQ-9 scores below.  Reports taking trintellix daily, noticed decrease in feeling paranoid and no more crying spelling. She has been able to get out of the house which continues to be difficult for her  .  See Care Plan below for interventions and patient self-care actives.    04/13/2022    8:38 AM 03/18/2022   10:08 AM 03/16/2018    5:09 PM  PHQ9 SCORE ONLY  PHQ-9 Total Score 17 20 0    Recommendation: Based on progress patient is currently making she may benefit from, and is in agreement to explore therapy options discussed.   Follow up Plan: Patient would like continued follow-up from CCM LCSW.  per patient's request will follow up in 1 week.  Will call office if needed prior to next encounter.   Assessment: Review of patient past medical history, allergies, medications, and health status, including review of relevant consultants reports was performed today as part of a comprehensive evaluation and provision of chronic care management and care coordination  services.     SDOH (Social Determinants of Health) assessments and interventions performed:  SDOH Interventions    Flowsheet Row Most Recent Value  SDOH Interventions   Depression Interventions/Treatment  Medication, Currently on Treatment        Advanced Directives Status: Not addressed in this encounter.  CCM Care Plan Conditions to be addressed/monitored: Anxiety and Depression;   Care Plan : LCSW Plan of Care  Updates made by Soundra Pilon, LCSW since 04/13/2022 12:00 AM     Problem: Coping Skills      Goal: Coping Skills Enhanced   Start Date: 03/30/2022  This Visit's Progress: On track  Recent Progress: On track  Priority: High  Note:   Current Barriers:  Disease Management support and education needs related to Depression: anxiety  CSW Clinical Goal(s):  patient will work with SW to address concerns related to managing symptoms of stress, anxiety and depression  through collaboration with Visual merchandiser, provider, and care team.   Interventions: 1:1 collaboration with primary care provider regarding development and update of comprehensive plan of care as evidenced by provider attestation and co-signature Inter-disciplinary care team collaboration (see longitudinal plan of care) Evaluation of current treatment plan related to  self management and patient's adherence to plan as established by provider  Mental Health:  (Status: Goal on Track (progressing): YES.) Evaluation of current treatment plan related to  Stress, anxiety and depression Solution-Focused Strategies employed:  Active listening / Reflection utilized  Problem [place] /[place] strategies reviewed Provided psychoeducation for mental health needs  Reviewed mental  health medications and discussed importance of compliance: takingTrintellix daily reports one missed dose Review therapy referral process and discussed options based on insurance and need information e-mail, given over phone and  mailed Provided EMMI education information on Depression Has not been able to review EMMI  Task & activities to accomplish goals: Continue with compliance of taking medication prescribed by Doctor Review your EMMI educational information (on Depression ) Look for an e-mail from [employer]. Review the website and call the agencies we discussed to schedule your counseling appointment    [facility] [address] [place], [place] [address] [email or web address]  Phone: [phone]  [facility] [address] [place], [place] [address] Phone [phone] [email or web address]         Sammuel Hines, LCSW Licensed Clinical Social Worker [facility]  [facility] [place]  [phone]

## 2022-04-17 DIAGNOSIS — F322 Major depressive disorder, single episode, severe without psychotic features: Secondary | ICD-10-CM

## 2022-04-20 ENCOUNTER — Other Ambulatory Visit: Payer: Self-pay | Admitting: Internal Medicine

## 2022-04-20 ENCOUNTER — Ambulatory Visit (INDEPENDENT_AMBULATORY_CARE_PROVIDER_SITE_OTHER): Payer: Medicare Other | Admitting: Licensed Clinical Social Worker

## 2022-04-20 DIAGNOSIS — F411 Generalized anxiety disorder: Secondary | ICD-10-CM

## 2022-04-20 DIAGNOSIS — F322 Major depressive disorder, single episode, severe without psychotic features: Secondary | ICD-10-CM

## 2022-04-20 NOTE — Patient Instructions (Signed)
Visit Information  Thank you for taking time to visit with me today. Please don't hesitate to contact me if I can be of assistance to you before our next scheduled telephone appointment.  Following are the goals we discussed today:  Task & activities to accomplish goals: Continue with compliance of taking medication prescribed by Doctor Call Dr. Ronnald Ramp to share your concerns I have also sent him a message Review the website and call the agencies we discussed to schedule your counseling appointment   [place],  [address] [email or web address]  Phone: [phone]   [place], [facility] [email or web address]    Phone: [phone] [place], [place], [place] Licensed Clinical Social Worker [facility]  [place]  [phone]   Our next appointment is by telephone on July 24th at 9:00  Please call the care guide team at [phone] if you need to cancel or reschedule your appointment.   If you are experiencing a Mental Health or [place] or need someone to talk to, please call the Suicide and Crisis Lifeline: 988 call the [place] National Suicide Prevention Lifeline: 516-249-5608 or TTY: 770-096-5368 TTY 571-099-3742) to talk to a trained counselor call 1-800-273-TALK (toll free, 24 hour hotline) go to [facility] [place] [phone])   The patient verbalized understanding of instructions, educational materials, and care plan provided today and agreed to receive a mailed copy of patient instructions, educational materials, and care plan.

## 2022-04-20 NOTE — Chronic Care Management (AMB) (Signed)
Chronic Care Management   Clinical Social Work Note  04/20/2022 Name: Stephanie [name] MRN: [id number] DOB: July 16, 1951  Stephanie [name] is a 71 y.o. year old female who is a primary care patient of [name], MD. The CCM team was consulted to assist the patient with chronic disease management and/or care coordination needs related to: Mental Health Counseling and Resources.   Engaged with patient by telephone for follow up visit in response to provider referral for social work chronic care management and care coordination services.   Consent to Services:  The patient was given information about Chronic Care Management services, agreed to services, and gave verbal consent prior to initiation of services.  Please see initial visit note for detailed documentation.   Patient agreed to services and consent obtained.   Summary:  Patient is making progress with decreasing symptoms of depression, today she reports concerns with side effects from Trintellis and did not take it yesterday.She researched the therapy agencies discussed however she has not contacted them to schedule an appointment .  See Care Plan below for interventions and patient self-care actives.  Recommendation: Patient may benefit from, and is in agreement to contact PCP to discuss concerns with medication and [name] the therapy agencies discussed to schedule an appointment.   Follow up Plan: Patient would like continued follow-up from CCM LCSW.  per patient's request will follow up in 3 weeks.  Will [name] office if needed prior to next encounter.   Assessment: Review of patient past medical history, allergies, medications, and health status, including review of relevant consultants reports was performed today as part of a comprehensive evaluation and provision of chronic care management and care coordination services.     SDOH (Social Determinants of Health) assessments and interventions performed:    Advanced Directives Status:  Not addressed in this encounter.  CCM Care Plan Conditions to be addressed/monitored: Anxiety and Depression;   Care Plan : LCSW Plan of Care  Updates made by Maurine Cane, LCSW since 04/20/2022 12:00 AM     Problem: Coping Skills      Goal: Coping Skills Enhanced   Start Date: 03/30/2022  This Visit's Progress: On track  Recent Progress: On track  Priority: High  Note:   Current Barriers:  Disease Management support and education needs related to Depression: anxiety  CSW Clinical Goal(s):  patient will work with SW to address concerns related to managing symptoms of stress, anxiety and depression  through collaboration with [place] representative, provider, and care team.   Interventions: 1:1 collaboration with primary care provider regarding development and update of comprehensive plan of care as evidenced by provider attestation and co-signature Inter-disciplinary care team collaboration (see longitudinal plan of care) Evaluation of current treatment plan related to  self management and patient's adherence to plan as established by provider  Mental Health:  (Status: Goal on Track (progressing): YES.) Evaluation of current treatment plan related to  Stress, anxiety and depression Solution-Focused Strategies employed:  Active listening / Reflection utilized  Problem [place] strategies reviewed Provided psychoeducation for mental health needs  Reviewed mental health medications and discussed importance of compliance: takingTrintellix daily reports concerns; LCSW shared with PCP Review therapy referral process and discussed options based on insurance and need information e-mail, given over phone and mailed  Task & activities to accomplish goals: Continue with compliance of taking medication prescribed by Doctor [name] Dr. Ronnald Ramp to share your concerns I have also sent him a message Review the website and  [name] the agencies we discussed to schedule your counseling  appointment   [facility] [place], [place] [address] [email or web address]  Phone: [phone]  [place], [facility] [email or web address]    Phone: [phone] [address] [place], [place], [place] Licensed Clinical Social Worker [facility]  [place]  [phone]

## 2022-04-22 ENCOUNTER — Telehealth: Payer: Self-pay

## 2022-04-22 ENCOUNTER — Ambulatory Visit: Payer: Medicare Other

## 2022-04-22 ENCOUNTER — Ambulatory Visit (INDEPENDENT_AMBULATORY_CARE_PROVIDER_SITE_OTHER): Payer: Medicare Other

## 2022-04-22 DIAGNOSIS — E538 Deficiency of other specified B group vitamins: Secondary | ICD-10-CM

## 2022-04-22 MED ORDER — CYANOCOBALAMIN 1000 MCG/ML IJ SOLN
1000.0000 ug | Freq: Once | INTRAMUSCULAR | Status: AC
  Administered 2022-04-22: 1000 ug via INTRAMUSCULAR

## 2022-04-22 NOTE — Progress Notes (Signed)
After obtaining consent, and per orders of Dr. Ronnald Ramp, injection of B12 given by Max Sane. Patient tolerated injection well in left deltoid, and instructed to report any adverse reaction to me immediately.

## 2022-04-22 NOTE — Telephone Encounter (Signed)
Patient came into the office today for her B12 injection and states that she is having nausea and constipation while taking her bp meds (not sure of the name). I'm assuming that she did some googling and is concerned that she is not supposed to take the meds while taking blood thinners. Please advise.

## 2022-05-07 ENCOUNTER — Other Ambulatory Visit: Payer: Self-pay | Admitting: Internal Medicine

## 2022-05-07 ENCOUNTER — Ambulatory Visit (INDEPENDENT_AMBULATORY_CARE_PROVIDER_SITE_OTHER): Payer: Medicare Other

## 2022-05-07 VITALS — BP 120/70 | HR 91 | Temp 97.6°F | Ht 63.0 in | Wt 266.8 lb

## 2022-05-07 DIAGNOSIS — Z1239 Encounter for other screening for malignant neoplasm of breast: Secondary | ICD-10-CM | POA: Diagnosis not present

## 2022-05-07 DIAGNOSIS — Z1231 Encounter for screening mammogram for malignant neoplasm of breast: Secondary | ICD-10-CM

## 2022-05-07 DIAGNOSIS — F322 Major depressive disorder, single episode, severe without psychotic features: Secondary | ICD-10-CM

## 2022-05-07 DIAGNOSIS — Z0000 Encounter for general adult medical examination without abnormal findings: Secondary | ICD-10-CM | POA: Diagnosis not present

## 2022-05-07 MED ORDER — VORTIOXETINE HBR 10 MG PO TABS: 10.0000 mg | ORAL_TABLET | Freq: Every day | ORAL | 1 refills | Status: DC

## 2022-05-07 NOTE — Progress Notes (Signed)
Subjective:   Stephanie [name] is a 71 y.o. female who presents for Medicare Annual (Subsequent) preventive examination.  Review of Systems     Cardiac Risk Factors include: advanced age (>79mn, >>45women);diabetes mellitus;dyslipidemia;family history of premature cardiovascular disease;hypertension;obesity (BMI >30kg/m2)     Objective:    Today's Vitals   05/07/22 1343  BP: 120/70  Pulse: 91  Temp: 97.6 F (36.4 C)  SpO2: 97%  Weight: 266 lb 12.8 oz (121 kg)  Height: _0  (1.6 m)   Body mass index is 47.26 kg/m.     09/23/2020   10:00 PM 03/04/2020   10:00 AM 01/02/2020    8:54 AM 07/25/2019    7:15 AM 07/08/2018    3:46 PM 03/02/2018    4:48 PM 07/18/2017    7:46 AM  Advanced Directives  Does Patient Have a Medical Advance Directive? _1  No No  Would patient like information on creating a medical advance directive? No - Patient declined No - Patient declined No - Patient declined No - Patient declined  No - Patient declined No - Patient declined    Current Medications (verified) Outpatient Encounter Medications as of 05/07/2022  Medication Sig   allopurinol (ZYLOPRIM) 100 MG tablet TAKE 1/2 TABLET(50 MG) BY MOUTH TWICE DAILY   ALPRAZolam (XANAX) 0.5 MG tablet Take 1 tablet (0.5 mg total) by mouth 3 (three) times daily as needed for anxiety.   Blood Pressure KIT 1 kit by Does not apply route daily. Check blood pressure once a day   colchicine 0.6 MG tablet Take 0.6 mg by mouth daily as needed (gout).   dabigatran (PRADAXA) 150 MG CAPS capsule TAKE 1 CAPSULE BY MOUTH TWICE DAILY( EVERY TWELVE HOURS)   dronedarone (MULTAQ) 400 MG tablet Take 1 tablet (400 mg total) by mouth 2 (two) times daily.   furosemide (LASIX) 40 MG tablet Take 1 tablet (40 mg total) by mouth daily.   JARDIANCE 25 MG TABS tablet Take 25 mg by mouth daily.   metoprolol succinate (TOPROL XL) 25 MG 24 hr tablet Take 1 tablet (25 mg total) by mouth daily.   sacubitril-valsartan (ENTRESTO)  24-26 MG Take 1 tablet by mouth 2 (two) times daily.   simvastatin (ZOCOR) 10 MG tablet Take 10 mg by mouth daily.    Accu-Chek FastClix Lancets MISC    Insulin Glargine (BASAGLAR KWIKPEN) 100 UNIT/ML Inject 20 Units into the skin daily. (Patient not taking: Reported on 05/07/2022)   Insulin Pen Needle (NOVOFINE) 30G X 8 MM MISC For Humalog meal time TID (Patient not taking: Reported on 05/07/2022)   NOVOLOG FLEXPEN 100 UNIT/ML FlexPen Inject 5 Units into the skin 3 (three) times daily with meals. (Patient not taking: Reported on 05/07/2022)   vortioxetine HBr (TRINTELLIX) 10 MG TABS tablet Take 1 tablet (10 mg total) by mouth daily. (Patient not taking: Reported on 05/07/2022)   No facility-administered encounter medications on file as of 05/07/2022.    Allergies (verified) Amiodarone, Aspirin, Methimazole, Propoxyphene, Albuterol, Atorvastatin, and Livalo [pitavastatin]   History: Past Medical History:  Diagnosis Date   Anemia    Ankle fracture, right    x 2   Antineoplastic and immunosuppressive drugs causing adverse effect in therapeutic use    Asthma    Chronic venous hypertension without complications    Colon polyps 2009   Coronary artery disease    no CAD by cath 11.2010   Depressive disorder, not elsewhere classified    Diabetes mellitus type 2  in obese ([place])    hgb AIC 6.1 09/02/2009, insulin dependent    Diastolic CHF, chronic ([place])    reports stable   Diverticulosis of colon 2009   colonoscopy   Gout    cortisone  shots helped   Hepatitis, unspecified    hx of/per pt, never was told of having hepatitis   Hx of hysterectomy 2002   Hyperlipidemia    Hypertension    controlled on medication    Hypothyroidism    reports her thryoid levels are normal now    Irritable bowel syndrome    Lung nodule    85m RLL nodule on CT Chest  07/2009, resolved by 2011 CT   Noncompliance    Obesity    Persistent atrial fibrillation ([place]    s/p DCCV 07/2009; Pradaxa Rx, states she  is in sinus rhythm now    PONV (postoperative nausea and vomiting)    after receiving "too much anesthesia" after a hernia surgery    Sarcoidosis    dx by eye exam; seen during eye exam , report asymtptomatic "i dont have it now'    Sleep disturbance 06/2013   sleep study, mild abnormality, no criteria for CPAP   Past Surgical History:  Procedure Laterality Date   ABDOMINAL HYSTERECTOMY     CARDIOVERSION     x 2   CARDIOVERSION N/A 02/27/2013   Procedure: CARDIOVERSION;  Surgeon: BLelon Perla MD;  [facility]  Service: Cardiovascular;  Laterality: N/A;   CARDIOVERSION N/A 03/24/2017   Procedure: CARDIOVERSION;  Surgeon: NJosue Hector MD;  [facility];  Service: Cardiovascular;  Laterality: N/A;   CARDIOVERSION N/A 03/04/2020   Procedure: CARDIOVERSION;  Surgeon: NJosue Hector MD;  [facility]  Service: Cardiovascular;  Laterality: N/A;   CHOLECYSTECTOMY  early 80s   COLONOSCOPY     Diverticulosis, colon polyps, repeat 2014, Dr. KDeatra Ina  COLONOSCOPY WITH PROPOFOL N/A 07/25/2019   Procedure: COLONOSCOPY WITH PROPOFOL;  Surgeon: AYetta Flock MD;  Location: [facility];  Service: Gastroenterology;  Laterality: N/A;   ESOPHAGOGASTRODUODENOSCOPY N/A 05/16/2014   Procedure: ESOPHAGOGASTRODUODENOSCOPY (EGD);  Surgeon: SWonda Horner MD;  Location: [facility];  Service: Gastroenterology;  Laterality: N/A;   HERNIA REPAIR  [id number]  umbilical hernia   MYOMECTOMY  2000   POLYPECTOMY  07/25/2019   Procedure: POLYPECTOMY;  Surgeon: AYetta Flock MD;  Location: [facility];  Service: Gastroenterology;;   Family History  Problem Relation Age of Onset   Pneumonia Father        deceased   Hypertension Father    Cancer Father    Multiple sclerosis Mother        hx   Emphysema Other        runs in the family   Social History   Socioeconomic History   Marital status: Single    Spouse name: Not on file   Number of children: 5   Years of  education: college   Highest education level: Not on file  Occupational History   Occupation: Disabled.  Tobacco Use   Smoking status: Never   Smokeless tobacco: Never   Tobacco comments:    Never smoke 01/27/22  Vaping Use   Vaping Use: Never used  Substance and Sexual Activity   Alcohol use: Yes    Alcohol/week: 0.0 standard drinks of alcohol    Comment: rare   Drug use: No   Sexual activity: Not Currently    Birth control/protection: Surgical  Other Topics Concern   Not on file  Social History Narrative   Pt lives in [place] with her son.  Unemployed.  Right handed.     Education college   Social Determinants of Health   Financial Resource Strain: Low Risk  (05/07/2022)   Overall Financial Resource Strain (CARDIA)    Difficulty of Paying Living Expenses: Not hard at all  Recent Concern: Financial Resource Strain - High Risk (03/30/2022)   Overall Financial Resource Strain (CARDIA)    Difficulty of Paying Living Expenses: Very hard  Food Insecurity: No Food Insecurity (05/07/2022)   Hunger Vital Sign    Worried About Running Out of Food in the Last Year: Never true    [place] in the Last Year: Never true  Transportation Needs: No Transportation Needs (05/07/2022)   PRAPARE - Hydrologist (Medical): No    Lack of Transportation (Non-Medical): No  Physical Activity: Sufficiently Active (05/07/2022)   Exercise Vital Sign    Days of Exercise per Week: 5 days    Minutes of Exercise per Session: 30 min  Stress: Stress Concern Present (05/07/2022)   [place]    Feeling of Stress : Very much  Social Connections: Not on file    Tobacco Counseling Counseling given: Not Answered Tobacco comments: Never smoke 01/27/22   Clinical Intake:  Pre-visit preparation completed: Yes  Pain : 0-10 Pain Type: Chronic pain Pain Location: Knee Pain Orientation: Left, Right Pain  Radiating Towards: none Pain Descriptors / Indicators: Discomfort, Aching Pain Onset: More than a month ago Pain Frequency: Constant Pain Relieving Factors: Knee injections Effect of Pain on Daily Activities: Pain can diminish job performance, lower motivation to exercise and prevent you from completing daily tasks.  Pain produces disability and affects the quality of life.  Pain Relieving Factors: Knee injections  BMI - recorded: 47.26 Nutritional Status: BMI > 30  Obese Nutritional Risks: None Diabetes: Yes CBG done?: No Did pt. bring in CBG monitor from home?: No  How often do you need to have someone help you when you read instructions, pamphlets, or other written materials from your doctor or pharmacy?: 1 - Never What is the last grade level you completed in school?: HSG; some college courses at [employer]  Diabetic? yes  Interpreter Needed?: No  Information entered by :: Lisette Abu, LPN.   Activities of Daily Living    05/07/2022    4:04 PM  In your present state of health, do you have any difficulty performing the following activities:  Hearing? 0  Vision? 0  Difficulty concentrating or making decisions? 0  Walking or climbing stairs? 0  Dressing or bathing? 0  Doing errands, shopping? 0  Preparing Food and eating ? N  Using the Toilet? N  In the past six months, have you accidently leaked urine? N  Do you have problems with loss of bowel control? N  Managing your Medications? N  Managing your Finances? N  Housekeeping or managing your Housekeeping? N    Patient Care Team: [name], MD as PCP - General (Internal Medicine) Deboraha Sprang, MD as PCP - Electrophysiology (Cardiology) Maurine Cane, LCSW as Social Worker (Licensed Clinical Social Worker)  Indicate any recent [place] you may have received from other than [facility] providers in the past year (date may be approximate).     Assessment:   This is a routine wellness examination for  Edwena Felty.  Hearing/Vision screen Hearing Screening - Comments:: Patient denied any hearing difficulty.   No hearing aids.  Vision Screening - Comments:: Patient does wear readers.  Eye exam done by: [facility]   Dietary issues and exercise activities discussed: Current Exercise Habits: Home exercise routine, Type of exercise: walking;Other - see comments (stair climbing; very active around the house), Time (Minutes): 30, Frequency (Times/Week): 5, Weekly Exercise (Minutes/Week): 150, Intensity: Mild, Exercise limited by: cardiac condition(s);psychological condition(s)   Goals Addressed             This Visit's Progress    My goal is to lose weight by joining the gym and get back into swimming.        Depression Screen    05/07/2022    3:59 PM 04/13/2022    8:38 AM 03/18/2022   10:08 AM 03/16/2018    5:09 PM 03/30/2017   12:08 PM 09/23/2015   10:53 AM  PHQ 2/9 Scores  PHQ - 2 Score 0 5 6 0 0 0  PHQ- 9 Score _0 Fall Risk    05/07/2022    4:03 PM 03/18/2022   10:09 AM 03/16/2018    5:09 PM 03/30/2017   12:08 PM 09/23/2015   10:52 AM  Fall Risk   Falls in the past year? 0 0 No Yes No  Number falls in past yr: 0   1   Injury with Fall? 0   No   Risk for fall due to : No Fall Risks      Follow up Falls evaluation completed   Education provided     [place]:  Any stairs in or around the home? Yes  If so, are there any without handrails? No  Home free of loose throw rugs in walkways, pet beds, electrical cords, etc? Yes  Adequate lighting in your home to reduce risk of falls? Yes   ASSISTIVE DEVICES UTILIZED TO PREVENT FALLS:  Life alert? No  Use of a cane, walker or w/c? No  Grab bars in the bathroom? No  Shower chair or bench in shower? No  Elevated toilet seat or a handicapped toilet? No   TIMED UP AND GO:  Was the test performed? Yes .  Length of time to ambulate 10 feet: 8 sec.   Gait steady and  fast without use of assistive device  Cognitive Function:        05/07/2022    4:05 PM  6CIT Screen  What Year? 0 points  What month? 0 points  What time? 0 points  Count back from 20 0 points  Months in reverse 0 points  Repeat phrase 0 points  Total Score 0 points    Immunizations Immunization History  Administered Date(s) Administered   Fluad Quad(high Dose 65+) 08/13/2017, 07/19/2019   Influenza Whole 09/02/2009   Influenza, High Dose Seasonal PF 08/13/2017, 08/22/2018, 08/22/2018, 07/19/2019   Influenza,inj,Quad PF,6+ Mos 08/04/2013, 08/13/2014, 08/02/2015, 07/11/2020   PFIZER(Purple Top)SARS-COV-2 Vaccination 12/17/2019, 01/14/2020, 09/09/2020   PNEUMOCOCCAL CONJUGATE-20 03/18/2022   PPD Test 11/07/2013   Pneumococcal Polysaccharide-23 10/25/2009, 10/25/2009, 08/20/2016, 08/20/2016   Tdap 08/04/2013   Zoster Recombinat (Shingrix) 08/02/2020, 10/04/2020    TDAP status: Up to date  Flu Vaccine status: Up to date  Pneumococcal vaccine status: Up to date  Covid-19 vaccine status: Completed vaccines  Qualifies for Shingles Vaccine? Yes   Zostavax completed No   Shingrix Completed?: Yes  Screening  Tests Health Maintenance  Topic Date Due   DEXA SCAN  Never done   COVID-19 Vaccine (4 - Booster for Pfizer series) 11/04/2020   MAMMOGRAM  03/19/2023 (Originally 04/11/2020)   INFLUENZA VACCINE  05/19/2022   HEMOGLOBIN A1C  08/14/2022   OPHTHALMOLOGY EXAM  12/20/2022   Diabetic kidney evaluation - GFR measurement  01/27/2023   Diabetic kidney evaluation - Urine ACR  02/13/2023   FOOT EXAM  03/22/2023   TETANUS/TDAP  08/05/2023   COLONOSCOPY (Pts 45-51yr Insurance coverage will need to be confirmed)  07/24/2029   Pneumonia Vaccine 70 Years old  Completed   Hepatitis C Screening  Completed   Zoster Vaccines- Shingrix  Completed   HPV VACCINES  Aged Out    Health Maintenance  Health Maintenance Due  Topic Date Due   DEXA SCAN  Never done   COVID-19 Vaccine  (4 - Booster for [facility] [place]) 11/04/2020    Colorectal cancer screening: Type of screening: Colonoscopy. Completed 07/25/2019. Repeat every 10 years  Mammogram status: Ordered 05/07/2022. Pt provided with contact info and advised to call to schedule appt.   Bone Density status: never done  Lung Cancer Screening: (Low Dose CT Chest recommended if Age 70-80years, 30 pack-year currently smoking OR have quit w/in 15years.) does not qualify.   Lung Cancer Screening Referral: no  Additional Screening:  Hepatitis C Screening: does qualify; Completed 08/17/2014  Vision Screening: Recommended annual ophthalmology exams for early detection of glaucoma and other disorders of the eye. Is the patient up to date with their annual eye exam?  Yes  Who is the provider or what is the name of the office in which the patient attends annual eye exams[facility] If pt is not established with a provider, would they like to be referred to a provider to establish care? No .   Dental Screening: Recommended annual dental exams for proper oral hygiene  Community Resource Referral / Chronic Care Management: CRR required this visit?  No   CCM required this visit?  No      Plan:     I have personally reviewed and noted the following in the patient's chart:   Medical and social history Use of alcohol, tobacco or illicit drugs  Current medications and supplements including opioid prescriptions.  Functional ability and status Nutritional status Physical activity Advanced directives List of other physicians Hospitalizations, surgeries, and ER visits in previous 12 months Vitals Screenings to include cognitive, depression, and falls Referrals and appointments  In addition, I have reviewed and discussed with patient certain preventive protocols, quality metrics, and best practice recommendations. A written personalized care plan for preventive services as well as general preventive health  recommendations were provided to patient.     SSheral Flow LPN   [id number]  Nurse Notes:  Hearing Screening - Comments:: Patient denied any hearing difficulty.   No hearing aids.  Vision Screening - Comments:: Patient does wear readers.  [facility]

## 2022-05-07 NOTE — Patient Instructions (Signed)
Stephanie [name] , Thank you for taking time to come for your Medicare Wellness Visit. I appreciate your ongoing commitment to your health goals. Please review the following plan we discussed and let me know if I can assist you in the future.   Screening recommendations/referrals: Colonoscopy: 07/25/2019; due every 10 years Mammogram: Patient will call [facility] [phone] Bone Density: Never done Recommended yearly ophthalmology/optometry visit for glaucoma screening and checkup Recommended yearly dental visit for hygiene and checkup  Vaccinations: Influenza vaccine: 07/14/2021 Pneumococcal vaccine: 08/20/2016, 03/18/2022 Tdap vaccine: 08/04/2013; due every 10 years Shingles vaccine: 08/02/2020, 10/04/2020 Covid-19: 12/17/2019, 01/14/2020, 09/09/2020, 03/29/2021, 07/14/2021  Advanced directives: No  Conditions/risks identified: Yes  Next appointment: Please schedule your next Medicare Wellness Visit with your Nurse Health Advisor in 1 year by calling [phone].   Preventive Care 70 Years and Older, Female Preventive care refers to lifestyle choices and visits with your health care provider that can promote health and wellness. What does preventive care include? A yearly physical exam. This is also called an annual well check. Dental exams once or twice a year. Routine eye exams. Ask your health care provider how often you should have your eyes checked. Personal lifestyle choices, including: Daily care of your teeth and gums. Regular physical activity. Eating a healthy diet. Avoiding tobacco and drug use. Limiting alcohol use. Practicing safe sex. Taking low-dose aspirin every day. Taking vitamin and mineral supplements as recommended by your health care provider. What happens during an annual well check? The services and screenings done by your health care provider during your annual well check will depend on your age, overall health, lifestyle risk factors, and family history  of disease. Counseling  Your health care provider may ask you questions about your: Alcohol use. Tobacco use. Drug use. Emotional well-being. Home and relationship well-being. Sexual activity. Eating habits. History of falls. Memory and ability to understand (cognition). Work and work Statistician. Reproductive health. Screening  You may have the following tests or measurements: Height, weight, and BMI. Blood pressure. Lipid and cholesterol levels. These may be checked every 5 years, or more frequently if you are over 48 years old. Skin check. Lung cancer screening. You may have this screening every year starting at age 36 if you have a 30-pack-year history of smoking and currently smoke or have quit within the past 15 years. Fecal occult blood test (FOBT) of the stool. You may have this test every year starting at age 21. Flexible sigmoidoscopy or colonoscopy. You may have a sigmoidoscopy every 5 years or a colonoscopy every 10 years starting at age 16. Hepatitis C blood test. Hepatitis B blood test. Sexually transmitted disease (STD) testing. Diabetes screening. This is done by checking your blood sugar (glucose) after you have not eaten for a while (fasting). You may have this done every 1-3 years. Bone density scan. This is done to screen for osteoporosis. You may have this done starting at age 70. Mammogram. This may be done every 1-2 years. Talk to your health care provider about how often you should have regular mammograms. Talk with your health care provider about your test results, treatment options, and if necessary, the need for more tests. Vaccines  Your health care provider may recommend certain vaccines, such as: Influenza vaccine. This is recommended every year. Tetanus, diphtheria, and acellular pertussis (Tdap, Td) vaccine. You may need a Td booster every 10 years. Zoster vaccine. You may need this after age 49. Pneumococcal 13-valent conjugate (PCV13) vaccine. One  dose is  recommended after age 13. Pneumococcal polysaccharide (PPSV23) vaccine. One dose is recommended after age 65. Talk to your health care provider about which screenings and vaccines you need and how often you need them. This information is not intended to replace advice given to you by your health care provider. Make sure you discuss any questions you have with your health care provider. Document Released: 11/01/2015 Document Revised: 06/24/2016 Document Reviewed: 08/06/2015 Elsevier Interactive Patient Education  2017 [place] Prevention in the Home Falls can cause injuries. They can happen to people of all ages. There are many things you can do to make your home safe and to help prevent falls. What can I do on the outside of my home? Regularly fix the edges of walkways and driveways and fix any cracks. Remove anything that might make you trip as you walk through a door, such as a raised step or threshold. Trim any bushes or trees on the path to your home. Use bright outdoor lighting. Clear any walking paths of anything that might make someone trip, such as rocks or tools. Regularly check to see if handrails are loose or broken. Make sure that both sides of any steps have handrails. Any raised decks and porches should have guardrails on the edges. Have any leaves, snow, or ice cleared regularly. Use sand or salt on walking paths during winter. Clean up any spills in your garage right away. This includes oil or grease spills. What can I do in the bathroom? Use night lights. Install grab bars by the toilet and in the tub and shower. Do not use towel bars as grab bars. Use non-skid mats or decals in the tub or shower. If you need to sit down in the shower, use a plastic, non-slip stool. Keep the floor dry. Clean up any water that spills on the floor as soon as it happens. Remove soap buildup in the tub or shower regularly. Attach bath mats securely with double-sided  non-slip rug tape. Do not have throw rugs and other things on the floor that can make you trip. What can I do in the bedroom? Use night lights. Make sure that you have a light by your bed that is easy to reach. Do not use any sheets or blankets that are too big for your bed. They should not hang down onto the floor. Have a firm chair that has side arms. You can use this for support while you get dressed. Do not have throw rugs and other things on the floor that can make you trip. What can I do in the kitchen? Clean up any spills right away. Avoid walking on wet floors. Keep items that you use a lot in easy-to-reach places. If you need to reach something above you, use a strong step stool that has a grab bar. Keep electrical cords out of the way. Do not use floor polish or wax that makes floors slippery. If you must use wax, use non-skid floor wax. Do not have throw rugs and other things on the floor that can make you trip. What can I do with my stairs? Do not leave any items on the stairs. Make sure that there are handrails on both sides of the stairs and use them. Fix handrails that are broken or loose. Make sure that handrails are as long as the stairways. Check any carpeting to make sure that it is firmly attached to the stairs. Fix any carpet that is loose or worn. Avoid having throw  rugs at the top or bottom of the stairs. If you do have throw rugs, attach them to the floor with carpet tape. Make sure that you have a light switch at the top of the stairs and the bottom of the stairs. If you do not have them, ask someone to add them for you. What else can I do to help prevent falls? Wear shoes that: Do not have high heels. Have rubber bottoms. Are comfortable and fit you well. Are closed at the toe. Do not wear sandals. If you use a stepladder: Make sure that it is fully opened. Do not climb a closed stepladder. Make sure that both sides of the stepladder are locked into place. Ask  someone to hold it for you, if possible. Clearly mark and make sure that you can see: Any grab bars or handrails. First and last steps. Where the edge of each step is. Use tools that help you move around (mobility aids) if they are needed. These include: Canes. Walkers. Scooters. Crutches. Turn on the lights when you go into a dark area. Replace any light bulbs as soon as they burn out. Set up your furniture so you have a clear path. Avoid moving your furniture around. If any of your floors are uneven, fix them. If there are any pets around you, be aware of where they are. Review your medicines with your doctor. Some medicines can make you feel dizzy. This can increase your chance of falling. Ask your doctor what other things that you can do to help prevent falls. This information is not intended to replace advice given to you by your health care provider. Make sure you discuss any questions you have with your health care provider. Document Released: 08/01/2009 Document Revised: 03/12/2016 Document Reviewed: 11/09/2014 Elsevier Interactive Patient Education  2017 [employer].

## 2022-05-11 ENCOUNTER — Ambulatory Visit: Payer: Medicare Other | Admitting: Licensed Clinical Social Worker

## 2022-05-11 DIAGNOSIS — F322 Major depressive disorder, single episode, severe without psychotic features: Secondary | ICD-10-CM

## 2022-05-11 DIAGNOSIS — F411 Generalized anxiety disorder: Secondary | ICD-10-CM

## 2022-05-11 NOTE — Patient Instructions (Addendum)
Visit Information  Thank you for taking time to visit with me today. Please don't hesitate to contact me if I can be of assistance to you before our next scheduled telephone appointment.  Following are the goals we discussed today: Managing your mental health Task & activities to accomplish goals: Continue with compliance of taking medication prescribed by Doctor Call Dr. Ronnald Ramp to share your concerns about Trintellis  Review the website and call the agencies we discussed to schedule your counseling appointment   [place] [address], [place] [address] [email or web address]  Phone: [phone]   [place], [facility] [email or web address]    Phone: [phone] [place], [place] next appointment is by telephone on Aug. 21 at 9:00  Please call the care guide team at [phone] if you need to cancel or reschedule your appointment.   If you are experiencing a Mental Health or [place] or need someone to talk to, please call the Suicide and Crisis Lifeline: 988 call the [place] National Suicide Prevention Lifeline: 812-861-3304 or TTY: 3057788684 TTY (612) 352-9635) to talk to a trained counselor call 1-800-273-TALK (toll free, 24 hour hotline) go to [facility] [address], [place] [phone])   Patient verbalizes understanding of instructions and care plan provided today and agrees to view in [place]. Active MyChart status and patient understanding of how to access instructions and care plan via MyChart confirmed with patient.     Casimer Lanius, LCSW Licensed Clinical Social Worker Care Coordination  [place]  [phone]

## 2022-05-11 NOTE — Chronic Care Management (AMB) (Signed)
Chronic Care Management   Clinical Social Work Note  05/11/2022 Name: Stephanie [name] MRN: [id number] DOB: 22-Nov-1950  Stephanie [name] is a 71 y.o. year old female who is a primary care patient of [name], MD. The CCM team was consulted to assist the patient with chronic disease management and/or care coordination needs related to: Mental Health Counseling and Resources.   Engaged with patient by telephone for follow up visit in response to provider referral for social work chronic care management and care coordination services.   Consent to Services:  The patient was given information about Chronic Care Management services, agreed to services, and gave verbal consent prior to initiation of services.  Please see initial visit note for detailed documentation.   Patient agreed to services and consent obtained.   Summary:  Patient is making progress with managing symptoms of depression and reports she is starting to feel better , but continues to have difficulty with moving forward to connect with therapy options discussed and consistently taking medication for depression. Reminded patient of of all up coming appointments.   See Care Plan below for interventions and patient self-care actives.  Recommendation: Patient may benefit from, and is in agreement  Discuss medication concerns with PCP Call to schedule therapy appointment  Implement interventions discussed today.   Follow up Plan: Patient would like continued follow-up from CCM LCSW.  per patient's request will follow up in 3 weeks.  Will call office if needed prior to next encounter.   Assessment: Review of patient past medical history, allergies, medications, and health status, including review of relevant consultants reports was performed today as part of a comprehensive evaluation and provision of chronic care management and care coordination services.     SDOH (Social Determinants of Health) assessments and interventions  performed:    Advanced Directives Status: Not addressed in this encounter.  CCM Care Plan Conditions to be addressed/monitored: Anxiety and Depression;   Care Plan : LCSW Plan of Care  Updates made by Maurine Cane, LCSW since 05/11/2022 12:00 AM     Problem: Coping Skills      Goal: Coping Skills Enhanced   Start Date: 03/30/2022  This Visit's Progress: On track  Recent Progress: On track  Priority: High  Note:   Current Barriers:  Disease Management support and education needs related to Depression: anxiety  CSW Clinical Goal(s):  patient will work with SW to address concerns related to managing symptoms of stress, anxiety and depression  through collaboration with [place] representative, provider, and care team.   Interventions: 1:1 collaboration with primary care provider regarding development and update of comprehensive plan of care as evidenced by provider attestation and co-signature Inter-disciplinary care team collaboration (see longitudinal plan of care) Evaluation of current treatment plan related to  self management and patient's adherence to plan as established by provider  Mental Health:  (Status: Goal on Track (progressing): YES.) Evaluation of current treatment plan related to  Stress, anxiety and depression Solution-Focused Strategies employed:  Active listening / Reflection utilized  Problem [place] strategies reviewed Provided psychoeducation for mental health needs  Reviewed mental health medications and discussed importance of compliance: Not takingTrintellix Review therapy referral process and discussed options based on insurance and need information e-mail, given over phone and mailed Provided EMMI education information on Movement: Emotional Health.  Task & activities to accomplish goals: Continue with compliance of taking medication prescribed by Doctor Call Dr. Ronnald Ramp to share your concerns about Tr[place]ellis  Review  the website and  call the agencies we discussed to schedule your counseling appointment   [place] [place], [place] [address] [email or web address]  Phone: [phone]  [place], [facility] [email or web address]    Phone: [phone] [address] [place], [place], [place] Licensed Clinical Social Worker Care [place]  [phone]

## 2022-05-18 DIAGNOSIS — F322 Major depressive disorder, single episode, severe without psychotic features: Secondary | ICD-10-CM

## 2022-05-22 ENCOUNTER — Ambulatory Visit: Payer: Medicare Other

## 2022-05-25 ENCOUNTER — Ambulatory Visit (INDEPENDENT_AMBULATORY_CARE_PROVIDER_SITE_OTHER): Payer: Medicare Other | Admitting: *Deleted

## 2022-05-25 DIAGNOSIS — E538 Deficiency of other specified B group vitamins: Secondary | ICD-10-CM

## 2022-05-25 MED ORDER — CYANOCOBALAMIN 1000 MCG/ML IJ SOLN
1000.0000 ug | Freq: Once | INTRAMUSCULAR | Status: AC
  Administered 2022-05-25: 1000 ug via INTRAMUSCULAR

## 2022-05-25 NOTE — Progress Notes (Signed)
Pls cosign for B12 inj../lmb  

## 2022-06-08 ENCOUNTER — Ambulatory Visit (INDEPENDENT_AMBULATORY_CARE_PROVIDER_SITE_OTHER): Payer: Medicare Other | Admitting: Licensed Clinical Social Worker

## 2022-06-08 DIAGNOSIS — F322 Major depressive disorder, single episode, severe without psychotic features: Secondary | ICD-10-CM

## 2022-06-08 DIAGNOSIS — F411 Generalized anxiety disorder: Secondary | ICD-10-CM

## 2022-06-08 NOTE — Chronic Care Management (AMB) (Addendum)
  Chronic Care Management   Clinical Social Work Note  06/08/2022 Name: Jacayla Nordell MRN: [id number] DOB: 06/29/1951  Sameena Artus is a 71 y.o. year old female who is a primary care patient of [name], MD. The CCM team was consulted to assist the patient with chronic disease management and/or care coordination needs related to: Mental Health Counseling and Resources.   Engaged with patient by telephone for follow up visit in response to provider referral for social work chronic care management and care coordination services.   Consent to Services:  The patient was given information about Chronic Care Management services, agreed to services, and gave verbal consent prior to initiation of services.  Please see initial visit note for detailed documentation.   Patient agreed to services and consent obtained.   Assessment: Assessed patient's needs, support system and barriers to care.  Patient continues to experience difficulty with managing symptoms of anxiety and depression..  Recommendation: Patient may benefit from, and is in agreement to Allow LCSW to assist with scheduling her initial therapy appointment today.  Follow up: Will make one more outreach to patient via care coordianation to make sure she connected with therapy  Review of patient past medical history, allergies, medications, and health status, including review of relevant consultants reports was performed today as part of a comprehensive evaluation and provision of chronic care management and care coordination services.     SDOH (Social Determinants of Health) assessments and interventions performed:    Advanced Directives Status: Not addressed in this encounter.  CCM Care Plan Conditions to be addressed/monitored: Anxiety and Depression;   Care Plan : LCSW Plan of Care  Updates made by Maurine Cane, LCSW since 06/08/2022 12:00 AM     Problem: Coping Skills Resolved 06/08/2022     Goal: Coping Skills  Enhanced Completed 06/08/2022  Start Date: 03/30/2022  This Visit's Progress: On track  Recent Progress: On track  Priority: High  Note:   Current Barriers:  Disease Management support and education needs related to Depression: anxiety  CSW Clinical Goal(s):  patient will work with SW to address concerns related to managing symptoms of stress, anxiety and depression  through collaboration with [place] representative, provider, and care team.   Interventions: 1:1 collaboration with primary care provider regarding development and update of comprehensive plan of care as evidenced by provider attestation and co-signature Inter-disciplinary care team collaboration (see longitudinal plan of care) Evaluation of current treatment plan related to  self management and patient's adherence to plan as established by provider  Mental Health:  (Status: Goal on Track (progressing): YES.) Evaluation of current treatment plan related to  Stress, anxiety and depression Solution-Focused Strategies employed:  Active listening / Reflection utilized  Problem [place] strategies reviewed Provided psychoeducation for mental health needs  Review therapy referral process and discussed options based on insurance assisted with calling today to schedule appointment Appointment Sept. 12th with [facility] Information faxed to [facility] for referral  Task & activities to accomplish goals: Continue with compliance of taking medication prescribed by Doctor Ronnald Ramp Keep appointment with with   [place] [address], [place] [address] [email or web address]  Phone: [place], [place]  [phone]

## 2022-06-08 NOTE — Patient Instructions (Addendum)
Visit Information  Thank you for taking time to visit with me today. Please don't hesitate to contact me if I can be of assistance to you before our next scheduled telephone appointment.  Following are the goals we discussed today:  Task & activities to accomplish goals: Continue with compliance of taking medication prescribed by Doctor Ronnald Ramp Keep appointment with with   [place] [place], [place] [address] [email or web address]  Phone: [phone]    Our next appointment is by telephone on Sept. 13th at 9:00  Please call the care guide team at [phone] if you need to cancel or reschedule your appointment.   If you are experiencing a Mental Health or [place] or need someone to talk to, please call the Suicide and Crisis Lifeline: 988 call the [place] National Suicide Prevention Lifeline: (276)122-0834 or TTY: (406)011-5991 TTY 9568086097) to talk to a trained counselor call 1-800-273-TALK (toll free, 24 hour hotline) go to [facility] [address], [place] [phone])   Patient verbalizes understanding of instructions and care plan provided today and agrees to view in [employer]. Active MyChart status and patient understanding of how to access instructions and care plan via MyChart confirmed with patient.     Casimer Lanius, [place]  [phone]

## 2022-06-10 ENCOUNTER — Ambulatory Visit (INDEPENDENT_AMBULATORY_CARE_PROVIDER_SITE_OTHER): Payer: Medicare Other | Admitting: Internal Medicine

## 2022-06-10 ENCOUNTER — Telehealth: Payer: Self-pay

## 2022-06-10 ENCOUNTER — Encounter: Payer: Self-pay | Admitting: Internal Medicine

## 2022-06-10 VITALS — BP 138/84 | HR 96 | Temp 98.0°F | Ht 63.0 in | Wt 265.0 lb

## 2022-06-10 DIAGNOSIS — L281 Prurigo nodularis: Secondary | ICD-10-CM

## 2022-06-10 DIAGNOSIS — E785 Hyperlipidemia, unspecified: Secondary | ICD-10-CM | POA: Insufficient documentation

## 2022-06-10 DIAGNOSIS — I10 Essential (primary) hypertension: Secondary | ICD-10-CM

## 2022-06-10 DIAGNOSIS — Z794 Long term (current) use of insulin: Secondary | ICD-10-CM

## 2022-06-10 DIAGNOSIS — E876 Hypokalemia: Secondary | ICD-10-CM

## 2022-06-10 DIAGNOSIS — M150 Primary generalized (osteo)arthritis: Secondary | ICD-10-CM | POA: Insufficient documentation

## 2022-06-10 DIAGNOSIS — E119 Type 2 diabetes mellitus without complications: Secondary | ICD-10-CM

## 2022-06-10 DIAGNOSIS — M159 Polyosteoarthritis, unspecified: Secondary | ICD-10-CM

## 2022-06-10 DIAGNOSIS — T502X5A Adverse effect of carbonic-anhydrase inhibitors, benzothiadiazides and other diuretics, initial encounter: Secondary | ICD-10-CM

## 2022-06-10 DIAGNOSIS — N1831 Chronic kidney disease, stage 3a: Secondary | ICD-10-CM

## 2022-06-10 LAB — BASIC METABOLIC PANEL
BUN: 8 mg/dL (ref 6–23)
CO2: 31 mEq/L (ref 19–32)
Calcium: 9.6 mg/dL (ref 8.4–10.5)
Chloride: 99 mEq/L (ref 96–112)
Creatinine, Ser: 1.38 mg/dL — ABNORMAL HIGH (ref 0.40–1.20)
GFR: 38.92 mL/min — ABNORMAL LOW (ref >60.00)
Glucose, Bld: 312 mg/dL — ABNORMAL HIGH (ref 70–99)
Potassium: 3.1 mEq/L — ABNORMAL LOW (ref 3.5–5.1)
Sodium: 138 mEq/L (ref 135–145)

## 2022-06-10 LAB — HEMOGLOBIN A1C: Hgb A1c MFr Bld: 8.9 % — ABNORMAL HIGH (ref 4.6–6.5)

## 2022-06-10 MED ORDER — KERENDIA 10 MG PO TABS: 1.0000 | ORAL_TABLET | Freq: Every day | ORAL | 0 refills | Status: DC

## 2022-06-10 MED ORDER — POTASSIUM CHLORIDE CRYS ER 15 MEQ PO TBCR: 15.0000 meq | EXTENDED_RELEASE_TABLET | Freq: Three times a day (TID) | ORAL | 0 refills | Status: DC

## 2022-06-10 MED ORDER — SIMVASTATIN 10 MG PO TABS: 10.0000 mg | ORAL_TABLET | Freq: Every day | ORAL | 1 refills | Status: DC

## 2022-06-10 MED ORDER — JARDIANCE 25 MG PO TABS: 25.0000 mg | ORAL_TABLET | Freq: Every day | ORAL | 1 refills | Status: DC

## 2022-06-10 MED ORDER — DEXCOM G7 SENSOR MISC: 1.0000 | Freq: Every day | 1 refills | Status: DC

## 2022-06-10 MED ORDER — DEXCOM G7 RECEIVER DEVI: 1.0000 | Freq: Every day | 1 refills | Status: DC

## 2022-06-10 MED ORDER — BASAGLAR KWIKPEN 100 UNIT/ML ~~LOC~~ SOPN: 30.0000 [IU] | PEN_INJECTOR | Freq: Every day | SUBCUTANEOUS | 1 refills | Status: DC

## 2022-06-10 MED ORDER — TRAMADOL HCL 50 MG PO TABS: 50.0000 mg | ORAL_TABLET | Freq: Three times a day (TID) | ORAL | 0 refills | Status: AC | PRN

## 2022-06-10 MED ORDER — HYDROXYZINE HCL 10 MG PO TABS: 10.0000 mg | ORAL_TABLET | Freq: Three times a day (TID) | ORAL | 0 refills | Status: DC | PRN

## 2022-06-10 NOTE — Telephone Encounter (Signed)
Key: [id number]

## 2022-06-10 NOTE — Patient Instructions (Addendum)
                                                                    [email or web address]                                                                                         [email or web address]

## 2022-06-10 NOTE — Telephone Encounter (Signed)
Approved 06/10/22-10/18/22.

## 2022-06-10 NOTE — Progress Notes (Signed)
Subjective:  Patient ID: Stephanie [name], female    DOB: 05/30/1951  Age: 71 y.o. MRN: [id number]  CC: Diabetes and Rash   HPI Stephanie [name] presents for f/up-  She has been taking advil for OA pain.  She complains of lesions on her lower legs.  They feel itchy/irritated so she picks at them and then they become hyperpigmented.  She said Xanax helps some but she would like to take something else to help her prevent from scratching the area.  She is no longer taking Trintellix due to GI side effects.  She says she does not want to take an antidepressant right now.  She tells me she is not using the insulin.  Outpatient Medications Prior to Visit  Medication Sig Dispense Refill   Accu-Chek FastClix Lancets MISC      allopurinol (ZYLOPRIM) 100 MG tablet TAKE 1/2 TABLET(50 MG) BY MOUTH TWICE DAILY 90 tablet 3   ALPRAZolam (XANAX) 0.5 MG tablet Take 1 tablet (0.5 mg total) by mouth 3 (three) times daily as needed for anxiety. 270 tablet 0   Blood Pressure KIT 1 kit by Does not apply route daily. Check blood pressure once a day 1 kit 0   colchicine 0.6 MG tablet Take 0.6 mg by mouth daily as needed (gout).     dabigatran (PRADAXA) 150 MG CAPS capsule TAKE 1 CAPSULE BY MOUTH TWICE DAILY( EVERY TWELVE HOURS) 60 capsule 5   dronedarone (MULTAQ) 400 MG tablet Take 1 tablet (400 mg total) by mouth 2 (two) times daily. 180 tablet 3   furosemide (LASIX) 40 MG tablet Take 1 tablet (40 mg total) by mouth daily. 90 tablet 3   Insulin Pen Needle (NOVOFINE) 30G X 8 MM MISC For Humalog meal time TID 100 each 11   metoprolol succinate (TOPROL XL) 25 MG 24 hr tablet Take 1 tablet (25 mg total) by mouth daily. 90 tablet 3   NOVOLOG FLEXPEN 100 UNIT/ML FlexPen Inject 5 Units into the skin 3 (three) times daily with meals. 15 mL 0   sacubitril-valsartan (ENTRESTO) 24-26 MG Take 1 tablet by mouth 2 (two) times daily. 180 tablet 3   Insulin Glargine (BASAGLAR KWIKPEN) 100 UNIT/ML Inject 20 Units into the  skin daily.     JARDIANCE 25 MG TABS tablet Take 25 mg by mouth daily.     simvastatin (ZOCOR) 10 MG tablet Take 10 mg by mouth daily.      vortioxetine HBr (TRINTELLIX) 10 MG TABS tablet Take 1 tablet (10 mg total) by mouth daily. 90 tablet 1   No facility-administered medications prior to visit.    ROS Review of Systems  Constitutional:  Negative for chills, diaphoresis, fatigue and fever.  HENT: Negative.    Eyes: Negative.   Respiratory:  Negative for cough, chest tightness, shortness of breath and wheezing.   Cardiovascular:  Negative for chest pain, palpitations and leg swelling.  Gastrointestinal:  Negative for abdominal pain, constipation, diarrhea, nausea and vomiting.  Endocrine: Negative.   Genitourinary: Negative.  Negative for difficulty urinating.  Musculoskeletal:  Positive for arthralgias. Negative for joint swelling and myalgias.  Skin:  Positive for color change.  Neurological: Negative.  Negative for dizziness, weakness and light-headedness.  Hematological:  Negative for adenopathy. Does not bruise/bleed easily.  Psychiatric/Behavioral:  Positive for decreased concentration and dysphoric mood. Negative for confusion, self-injury, sleep disturbance and suicidal ideas. The patient is nervous/anxious.     Objective:  BP 138/84 (BP Location: Right Arm, Patient Position:  Sitting, Cuff Size: Large)   Pulse 96   Temp 98 F (36.7 C) (Oral)   Ht '5\' 3"'  (1.6 m)   Wt 265 lb (120.2 kg)   SpO2 99%   BMI 46.94 kg/m   BP Readings from Last 3 Encounters:  06/10/22 138/84  05/07/22 120/70  03/18/22 132/86    Wt Readings from Last 3 Encounters:  06/10/22 265 lb (120.2 kg)  05/07/22 266 lb 12.8 oz (121 kg)  03/18/22 274 lb (124.3 kg)    Physical Exam Vitals reviewed.  HENT:     Mouth/Throat:     Mouth: Mucous membranes are moist.  Eyes:     General: No scleral icterus.    Conjunctiva/sclera: Conjunctivae normal.  Cardiovascular:     Rate and Rhythm: Normal rate  and regular rhythm.     Heart sounds: No murmur heard. Pulmonary:     Effort: Pulmonary effort is normal.     Breath sounds: No stridor. No wheezing, rhonchi or rales.  Abdominal:     General: Abdomen is flat.     Palpations: There is no mass.     Tenderness: There is no abdominal tenderness. There is no guarding.     Hernia: No hernia is present.  Musculoskeletal:        General: Normal range of motion.     Cervical back: Neck supple.     Right lower leg: No edema.     Left lower leg: No edema.  Lymphadenopathy:     Cervical: No cervical adenopathy.  Skin:    Findings: Lesion present. No rash.  Neurological:     General: No focal deficit present.     Mental Status: She is alert.  Psychiatric:        Mood and Affect: Mood normal.        Behavior: Behavior normal.     Lab Results  Component Value Date   WBC 5.2 03/18/2022   HGB 12.0 03/18/2022   HCT 36.9 03/18/2022   PLT 201.0 03/18/2022   GLUCOSE 312 (H) 06/10/2022   CHOL 179 02/12/2022   TRIG 129 02/12/2022   HDL 55 02/12/2022   LDLCALC 101 02/12/2022   ALT 11 01/26/2022   AST 30 01/26/2022   NA 138 06/10/2022   K 3.1 (L) 06/10/2022   CL 99 06/10/2022   CREATININE 1.38 (H) 06/10/2022   BUN 8 06/10/2022   CO2 31 06/10/2022   TSH 2.78 03/18/2022   INR 1.2 (H) 04/05/2013   HGBA1C 8.9 (H) 06/10/2022   MICROALBUR 14.13 02/12/2022    No results found.  Assessment & Plan:   Stephanie [name] was seen today for diabetes and rash.  Diagnoses and all orders for this visit:  Essential hypertension- She has not achieved her blood pressure goal of 130/80.  I have asked her to stop taking NSAIDs. -     Basic metabolic panel; Future -     Basic metabolic panel -     potassium chloride SA (KLOR-CON M15) 15 MEQ tablet; Take 1 tablet (15 mEq total) by mouth 3 (three) times daily.  Insulin-requiring or dependent type II diabetes mellitus ([place])- Her blood sugar is not adequately well controlled.  Will restart basal insulin. -      Basic metabolic panel; Future -     Hemoglobin A1c; Future -     Hemoglobin A1c -     Basic metabolic panel -     JARDIANCE 25 MG TABS tablet; Take 1 tablet (25 mg total)  by mouth daily. -     Insulin Glargine (BASAGLAR KWIKPEN) 100 UNIT/ML; Inject 30 Units into the skin daily. -     Continuous Blood Gluc Sensor (DEXCOM G7 SENSOR) MISC; 1 Act by Does not apply route daily. -     Continuous Blood Gluc Receiver ([place]) [place]; 1 Act by Does not apply route daily. -     Finerenone (KERENDIA) 10 MG TABS; Take 1 tablet by mouth daily.  Picker's nodules -     hydrOXYzine (ATARAX) 10 MG tablet; Take 1 tablet (10 mg total) by mouth every 8 (eight) hours as needed.  Dyslipidemia, goal LDL below 100 -     simvastatin (ZOCOR) 10 MG tablet; Take 1 tablet (10 mg total) by mouth daily.  Primary osteoarthritis involving multiple joints -     traMADol (ULTRAM) 50 MG tablet; Take 1 tablet (50 mg total) by mouth every 8 (eight) hours as needed for up to 5 days.  Stage 3a chronic kidney disease (HCC) -     Finerenone (KERENDIA) 10 MG TABS; Take 1 tablet by mouth daily.  Diuretic-induced hypokalemia -     potassium chloride SA (KLOR-CON M15) 15 MEQ tablet; Take 1 tablet (15 mEq total) by mouth 3 (three) times daily.   I have discontinued Stephanie [name]'s vortioxetine HBr. I have also changed her simvastatin, Jardiance, and [employer]. Additionally, I am having her start on hydrOXYzine, traMADol, Dexcom G7 Sensor, Dexcom G7 Receiver, potassium chloride SA, and Kerendia. Lastly, I am having her maintain her Insulin Pen Needle, NovoLOG FlexPen, Accu-Chek FastClix Lancets, Blood Pressure, furosemide, Entresto, metoprolol succinate, Multaq, allopurinol, colchicine, dabigatran, and ALPRAZolam.  Meds ordered this encounter  Medications   hydrOXYzine (ATARAX) 10 MG tablet    Sig: Take 1 tablet (10 mg total) by mouth every 8 (eight) hours as needed.    Dispense:  270 tablet    Refill:  0    simvastatin (ZOCOR) 10 MG tablet    Sig: Take 1 tablet (10 mg total) by mouth daily.    Dispense:  90 tablet    Refill:  1   traMADol (ULTRAM) 50 MG tablet    Sig: Take 1 tablet (50 mg total) by mouth every 8 (eight) hours as needed for up to 5 days.    Dispense:  270 tablet    Refill:  0   JARDIANCE 25 MG TABS tablet    Sig: Take 1 tablet (25 mg total) by mouth daily.    Dispense:  90 tablet    Refill:  1   Insulin Glargine (BASAGLAR KWIKPEN) 100 UNIT/ML    Sig: Inject 30 Units into the skin daily.    Dispense:  27 mL    Refill:  1   Continuous Blood Gluc Sensor (DEXCOM G7 SENSOR) MISC    Sig: 1 Act by Does not apply route daily.    Dispense:  9 each    Refill:  1   Continuous Blood Gluc Receiver ([place]) DEVI    Sig: 1 Act by Does not apply route daily.    Dispense:  9 each    Refill:  1   potassium chloride SA (KLOR-CON M15) 15 MEQ tablet    Sig: Take 1 tablet (15 mEq total) by mouth 3 (three) times daily.    Dispense:  270 tablet    Refill:  0   Finerenone (KERENDIA) 10 MG TABS    Sig: Take 1 tablet by mouth daily.    Dispense:  30 tablet    Refill:  0     Follow-up: Return in about 6 months (around 12/11/2022).  Stephanie Calico, MD

## 2022-06-17 ENCOUNTER — Telehealth: Payer: Medicare Other

## 2022-06-18 ENCOUNTER — Encounter (HOSPITAL_COMMUNITY): Payer: Self-pay

## 2022-06-18 ENCOUNTER — Other Ambulatory Visit: Payer: Self-pay

## 2022-06-18 ENCOUNTER — Other Ambulatory Visit: Payer: Self-pay | Admitting: Student

## 2022-06-18 ENCOUNTER — Inpatient Hospital Stay (HOSPITAL_COMMUNITY)
Admission: EM | Admit: 2022-06-18 | Discharge: 2022-06-21 | DRG: 392 | Disposition: A | Discharge status: Home / Self Care | Payer: Medicare Other | Attending: Internal Medicine | Admitting: Internal Medicine

## 2022-06-18 DIAGNOSIS — K5720 Diverticulitis of large intestine with perforation and abscess without bleeding: Principal | ICD-10-CM | POA: Diagnosis present

## 2022-06-18 DIAGNOSIS — E785 Hyperlipidemia, unspecified: Secondary | ICD-10-CM | POA: Diagnosis present

## 2022-06-18 DIAGNOSIS — J45909 Unspecified asthma, uncomplicated: Secondary | ICD-10-CM | POA: Diagnosis present

## 2022-06-18 DIAGNOSIS — E119 Type 2 diabetes mellitus without complications: Secondary | ICD-10-CM

## 2022-06-18 DIAGNOSIS — Z86010 Personal history of colonic polyps: Secondary | ICD-10-CM

## 2022-06-18 DIAGNOSIS — Z86718 Personal history of other venous thrombosis and embolism: Secondary | ICD-10-CM

## 2022-06-18 DIAGNOSIS — N1831 Chronic kidney disease, stage 3a: Secondary | ICD-10-CM | POA: Diagnosis present

## 2022-06-18 DIAGNOSIS — I4891 Unspecified atrial fibrillation: Secondary | ICD-10-CM | POA: Diagnosis present

## 2022-06-18 DIAGNOSIS — Z820 Family history of epilepsy and other diseases of the nervous system: Secondary | ICD-10-CM

## 2022-06-18 DIAGNOSIS — E876 Hypokalemia: Secondary | ICD-10-CM | POA: Diagnosis present

## 2022-06-18 DIAGNOSIS — M109 Gout, unspecified: Secondary | ICD-10-CM | POA: Diagnosis present

## 2022-06-18 DIAGNOSIS — I428 Other cardiomyopathies: Secondary | ICD-10-CM

## 2022-06-18 DIAGNOSIS — I2510 Atherosclerotic heart disease of native coronary artery without angina pectoris: Secondary | ICD-10-CM | POA: Diagnosis present

## 2022-06-18 DIAGNOSIS — E039 Hypothyroidism, unspecified: Secondary | ICD-10-CM | POA: Diagnosis present

## 2022-06-18 DIAGNOSIS — K5732 Diverticulitis of large intestine without perforation or abscess without bleeding: Secondary | ICD-10-CM | POA: Diagnosis not present

## 2022-06-18 DIAGNOSIS — I4819 Other persistent atrial fibrillation: Secondary | ICD-10-CM | POA: Diagnosis present

## 2022-06-18 DIAGNOSIS — K5792 Diverticulitis of intestine, part unspecified, without perforation or abscess without bleeding: Principal | ICD-10-CM | POA: Diagnosis present

## 2022-06-18 DIAGNOSIS — Z91199 Patient's noncompliance with other medical treatment and regimen due to unspecified reason: Secondary | ICD-10-CM

## 2022-06-18 DIAGNOSIS — D869 Sarcoidosis, unspecified: Secondary | ICD-10-CM | POA: Diagnosis present

## 2022-06-18 DIAGNOSIS — I5022 Chronic systolic (congestive) heart failure: Secondary | ICD-10-CM | POA: Diagnosis present

## 2022-06-18 DIAGNOSIS — Z6841 Body Mass Index (BMI) 40.0 and over, adult: Secondary | ICD-10-CM

## 2022-06-18 DIAGNOSIS — Z79899 Other long term (current) drug therapy: Secondary | ICD-10-CM

## 2022-06-18 DIAGNOSIS — F322 Major depressive disorder, single episode, severe without psychotic features: Secondary | ICD-10-CM

## 2022-06-18 DIAGNOSIS — I110 Hypertensive heart disease with heart failure: Secondary | ICD-10-CM | POA: Diagnosis present

## 2022-06-18 DIAGNOSIS — Z8249 Family history of ischemic heart disease and other diseases of the circulatory system: Secondary | ICD-10-CM

## 2022-06-18 DIAGNOSIS — F419 Anxiety disorder, unspecified: Secondary | ICD-10-CM | POA: Diagnosis present

## 2022-06-18 DIAGNOSIS — Z888 Allergy status to other drugs, medicaments and biological substances status: Secondary | ICD-10-CM

## 2022-06-18 DIAGNOSIS — Z794 Long term (current) use of insulin: Secondary | ICD-10-CM

## 2022-06-18 DIAGNOSIS — D649 Anemia, unspecified: Secondary | ICD-10-CM | POA: Diagnosis present

## 2022-06-18 DIAGNOSIS — Z825 Family history of asthma and other chronic lower respiratory diseases: Secondary | ICD-10-CM

## 2022-06-18 LAB — CBC WITH DIFFERENTIAL/PLATELET
Abs Immature Granulocytes: 0.02 10*3/uL (ref 0.00–0.07)
Basophils Absolute: 0 10*3/uL (ref 0.0–0.1)
Basophils Relative: 0 %
Eosinophils Absolute: 0.1 10*3/uL (ref 0.0–0.5)
Eosinophils Relative: 1 %
HCT: 37 % (ref 36.0–46.0)
Hemoglobin: 11.7 g/dL — ABNORMAL LOW (ref 12.0–15.0)
Immature Granulocytes: 0 %
Lymphocytes Relative: 32 %
Lymphs Abs: 2.3 10*3/uL (ref 0.7–4.0)
MCH: 30.1 pg (ref 26.0–34.0)
MCHC: 31.6 g/dL (ref 30.0–36.0)
MCV: 95.1 fL (ref 80.0–100.0)
Monocytes Absolute: 0.5 10*3/uL (ref 0.1–1.0)
Monocytes Relative: 7 %
Neutro Abs: 4.2 10*3/uL (ref 1.7–7.7)
Neutrophils Relative %: 60 %
Platelets: 235 10*3/uL (ref 150–400)
RBC: 3.89 MIL/uL (ref 3.87–5.11)
RDW: 13.5 % (ref 11.5–15.5)
WBC: 7 10*3/uL (ref 4.0–10.5)
nRBC: 0 % (ref 0.0–0.2)

## 2022-06-18 LAB — COMPREHENSIVE METABOLIC PANEL
ALT: 10 U/L (ref 0–44)
AST: 13 U/L — ABNORMAL LOW (ref 15–41)
Albumin: 3.6 g/dL (ref 3.5–5.0)
Alkaline Phosphatase: 82 U/L (ref 38–126)
Anion gap: 7 (ref 5–15)
BUN: 22 mg/dL (ref 8–23)
CO2: 26 mmol/L (ref 22–32)
Calcium: 9.3 mg/dL (ref 8.9–10.3)
Chloride: 107 mmol/L (ref 98–111)
Creatinine, Ser: 1.34 mg/dL — ABNORMAL HIGH (ref 0.44–1.00)
GFR, Estimated: 43 mL/min — ABNORMAL LOW (ref >60)
Glucose, Bld: 233 mg/dL — ABNORMAL HIGH (ref 70–99)
Potassium: 3.4 mmol/L — ABNORMAL LOW (ref 3.5–5.1)
Sodium: 140 mmol/L (ref 135–145)
Total Bilirubin: 0.8 mg/dL (ref 0.3–1.2)
Total Protein: 7.6 g/dL (ref 6.5–8.1)

## 2022-06-18 LAB — LIPASE, BLOOD: Lipase: 31 U/L (ref 11–51)

## 2022-06-18 MED ORDER — ENTRESTO 24-26 MG PO TABS: 1.0000 | ORAL_TABLET | Freq: Two times a day (BID) | ORAL | 2 refills | Status: DC

## 2022-06-18 MED ORDER — MULTAQ 400 MG PO TABS: 400.0000 mg | ORAL_TABLET | Freq: Two times a day (BID) | ORAL | 2 refills | Status: DC

## 2022-06-18 NOTE — ED Triage Notes (Addendum)
Pt states that her abdominal pain started yesterday and feels like her diverticulitis. Pt states that she keeps running to the bathroom and there is clear liquid and mucus coming from her rectum. Pt requests to be seen by an MD not a PA or NP.

## 2022-06-19 ENCOUNTER — Emergency Department (HOSPITAL_COMMUNITY): Payer: Medicare Other

## 2022-06-19 ENCOUNTER — Encounter (HOSPITAL_COMMUNITY): Payer: Self-pay | Admitting: Internal Medicine

## 2022-06-19 DIAGNOSIS — Z825 Family history of asthma and other chronic lower respiratory diseases: Secondary | ICD-10-CM | POA: Diagnosis not present

## 2022-06-19 DIAGNOSIS — Z794 Long term (current) use of insulin: Secondary | ICD-10-CM | POA: Diagnosis not present

## 2022-06-19 DIAGNOSIS — K631 Perforation of intestine (nontraumatic): Secondary | ICD-10-CM | POA: Diagnosis not present

## 2022-06-19 DIAGNOSIS — I4819 Other persistent atrial fibrillation: Secondary | ICD-10-CM

## 2022-06-19 DIAGNOSIS — K5732 Diverticulitis of large intestine without perforation or abscess without bleeding: Secondary | ICD-10-CM | POA: Diagnosis present

## 2022-06-19 DIAGNOSIS — Z6841 Body Mass Index (BMI) 40.0 and over, adult: Secondary | ICD-10-CM | POA: Diagnosis not present

## 2022-06-19 DIAGNOSIS — D649 Anemia, unspecified: Secondary | ICD-10-CM | POA: Diagnosis present

## 2022-06-19 DIAGNOSIS — D869 Sarcoidosis, unspecified: Secondary | ICD-10-CM | POA: Diagnosis present

## 2022-06-19 DIAGNOSIS — Z86010 Personal history of colonic polyps: Secondary | ICD-10-CM | POA: Diagnosis not present

## 2022-06-19 DIAGNOSIS — I2510 Atherosclerotic heart disease of native coronary artery without angina pectoris: Secondary | ICD-10-CM | POA: Diagnosis present

## 2022-06-19 DIAGNOSIS — Z820 Family history of epilepsy and other diseases of the nervous system: Secondary | ICD-10-CM | POA: Diagnosis not present

## 2022-06-19 DIAGNOSIS — Z888 Allergy status to other drugs, medicaments and biological substances status: Secondary | ICD-10-CM | POA: Diagnosis not present

## 2022-06-19 DIAGNOSIS — K5792 Diverticulitis of intestine, part unspecified, without perforation or abscess without bleeding: Secondary | ICD-10-CM | POA: Diagnosis present

## 2022-06-19 DIAGNOSIS — I5022 Chronic systolic (congestive) heart failure: Secondary | ICD-10-CM | POA: Diagnosis present

## 2022-06-19 DIAGNOSIS — Z91199 Patient's noncompliance with other medical treatment and regimen due to unspecified reason: Secondary | ICD-10-CM | POA: Diagnosis not present

## 2022-06-19 DIAGNOSIS — Z8249 Family history of ischemic heart disease and other diseases of the circulatory system: Secondary | ICD-10-CM | POA: Diagnosis not present

## 2022-06-19 DIAGNOSIS — I110 Hypertensive heart disease with heart failure: Secondary | ICD-10-CM | POA: Diagnosis present

## 2022-06-19 DIAGNOSIS — I428 Other cardiomyopathies: Secondary | ICD-10-CM | POA: Diagnosis present

## 2022-06-19 DIAGNOSIS — E119 Type 2 diabetes mellitus without complications: Secondary | ICD-10-CM | POA: Diagnosis present

## 2022-06-19 DIAGNOSIS — F419 Anxiety disorder, unspecified: Secondary | ICD-10-CM | POA: Diagnosis present

## 2022-06-19 DIAGNOSIS — K5720 Diverticulitis of large intestine with perforation and abscess without bleeding: Secondary | ICD-10-CM | POA: Diagnosis not present

## 2022-06-19 DIAGNOSIS — E039 Hypothyroidism, unspecified: Secondary | ICD-10-CM | POA: Diagnosis present

## 2022-06-19 DIAGNOSIS — E785 Hyperlipidemia, unspecified: Secondary | ICD-10-CM | POA: Diagnosis present

## 2022-06-19 DIAGNOSIS — Z86718 Personal history of other venous thrombosis and embolism: Secondary | ICD-10-CM | POA: Diagnosis not present

## 2022-06-19 DIAGNOSIS — E876 Hypokalemia: Secondary | ICD-10-CM | POA: Diagnosis present

## 2022-06-19 DIAGNOSIS — J45909 Unspecified asthma, uncomplicated: Secondary | ICD-10-CM | POA: Diagnosis present

## 2022-06-19 LAB — HEPARIN LEVEL (UNFRACTIONATED)
Heparin Unfractionated: 0.49 IU/mL (ref 0.30–0.70)
Heparin Unfractionated: 0.45 IU/mL (ref 0.30–0.70)

## 2022-06-19 LAB — GLUCOSE, CAPILLARY
Glucose-Capillary: 107 mg/dL — ABNORMAL HIGH (ref 70–99)
Glucose-Capillary: 98 mg/dL (ref 70–99)

## 2022-06-19 LAB — URINALYSIS, ROUTINE W REFLEX MICROSCOPIC
Bacteria, UA: NONE SEEN
Bilirubin Urine: NEGATIVE
Glucose, UA: >=500 mg/dL — AB
Hgb urine dipstick: NEGATIVE
Ketones, ur: 20 mg/dL — AB
Leukocytes,Ua: NEGATIVE
Nitrite: NEGATIVE
Protein, ur: NEGATIVE mg/dL
Specific Gravity, Urine: 1.036 — ABNORMAL HIGH (ref 1.005–1.030)
pH: 5 (ref 5.0–8.0)

## 2022-06-19 LAB — CBC
HCT: 32.2 % — ABNORMAL LOW (ref 36.0–46.0)
Hemoglobin: 9.9 g/dL — ABNORMAL LOW (ref 12.0–15.0)
MCH: 29.8 pg (ref 26.0–34.0)
MCHC: 30.7 g/dL (ref 30.0–36.0)
MCV: 97 fL (ref 80.0–100.0)
Platelets: 183 10*3/uL (ref 150–400)
RBC: 3.32 MIL/uL — ABNORMAL LOW (ref 3.87–5.11)
RDW: 13.6 % (ref 11.5–15.5)
WBC: 4.7 10*3/uL (ref 4.0–10.5)
nRBC: 0 % (ref 0.0–0.2)

## 2022-06-19 LAB — CBG MONITORING, ED
Glucose-Capillary: 167 mg/dL — ABNORMAL HIGH (ref 70–99)
Glucose-Capillary: 112 mg/dL — ABNORMAL HIGH (ref 70–99)

## 2022-06-19 LAB — HIV ANTIBODY (ROUTINE TESTING W REFLEX): HIV Screen 4th Generation wRfx: NONREACTIVE

## 2022-06-19 MED ORDER — METOPROLOL TARTRATE 5 MG/5ML IV SOLN
5.0000 mg | Freq: Four times a day (QID) | INTRAVENOUS | Status: DC
  Administered 2022-06-19 – 2022-06-20 (×6): 5 mg via INTRAVENOUS
  Filled 2022-06-19 (×6): qty 5

## 2022-06-19 MED ORDER — ACETAMINOPHEN 650 MG RE SUPP: 650.0000 mg | Freq: Four times a day (QID) | RECTAL | Status: DC | PRN

## 2022-06-19 MED ORDER — FENTANYL CITRATE PF 50 MCG/ML IJ SOSY
25.0000 ug | PREFILLED_SYRINGE | INTRAMUSCULAR | Status: DC | PRN
  Administered 2022-06-19: 25 ug via INTRAVENOUS
  Filled 2022-06-19: qty 1

## 2022-06-19 MED ORDER — IOHEXOL 300 MG/ML  SOLN
80.0000 mL | Freq: Once | INTRAMUSCULAR | Status: AC | PRN
  Administered 2022-06-19: 80 mL via INTRAVENOUS

## 2022-06-19 MED ORDER — PIPERACILLIN-TAZOBACTAM 3.375 G IVPB
3.3750 g | Freq: Once | INTRAVENOUS | Status: AC
  Administered 2022-06-19: 3.375 g via INTRAVENOUS
  Filled 2022-06-19: qty 50

## 2022-06-19 MED ORDER — PIPERACILLIN-TAZOBACTAM 3.375 G IVPB
3.3750 g | Freq: Three times a day (TID) | INTRAVENOUS | Status: DC
  Administered 2022-06-19 – 2022-06-21 (×6): 3.375 g via INTRAVENOUS
  Filled 2022-06-19 (×6): qty 50

## 2022-06-19 MED ORDER — IBUPROFEN 800 MG PO TABS
800.0000 mg | ORAL_TABLET | Freq: Once | ORAL | Status: AC
  Administered 2022-06-19: 800 mg via ORAL
  Filled 2022-06-19: qty 1

## 2022-06-19 MED ORDER — ALPRAZOLAM 0.25 MG PO TABS
0.2500 mg | ORAL_TABLET | Freq: Every evening | ORAL | Status: DC | PRN
  Administered 2022-06-19: 0.25 mg via ORAL
  Filled 2022-06-19: qty 1

## 2022-06-19 MED ORDER — SODIUM CHLORIDE (PF) 0.9 % IJ SOLN
INTRAMUSCULAR | Status: AC
  Filled 2022-06-19: qty 50

## 2022-06-19 MED ORDER — INSULIN ASPART 100 UNIT/ML IJ SOLN
0.0000 [IU] | INTRAMUSCULAR | Status: DC
  Administered 2022-06-19: 1 [IU] via SUBCUTANEOUS
  Filled 2022-06-19: qty 0.06

## 2022-06-19 MED ORDER — HEPARIN (PORCINE) 25000 UT/250ML-% IV SOLN
1250.0000 [IU]/h | INTRAVENOUS | Status: DC
  Administered 2022-06-19 – 2022-06-20 (×3): 1250 [IU]/h via INTRAVENOUS
  Filled 2022-06-19 (×3): qty 250

## 2022-06-19 MED ORDER — ACETAMINOPHEN 325 MG PO TABS
650.0000 mg | ORAL_TABLET | Freq: Four times a day (QID) | ORAL | Status: DC | PRN
  Administered 2022-06-19 – 2022-06-20 (×2): 650 mg via ORAL
  Filled 2022-06-19 (×2): qty 2

## 2022-06-19 MED ORDER — INSULIN GLARGINE-YFGN 100 UNIT/ML ~~LOC~~ SOLN
10.0000 [IU] | Freq: Every day | SUBCUTANEOUS | Status: DC
  Administered 2022-06-19 – 2022-06-21 (×3): 10 [IU] via SUBCUTANEOUS
  Filled 2022-06-19 (×3): qty 0.1

## 2022-06-19 MED ORDER — HEPARIN BOLUS VIA INFUSION
3000.0000 [IU] | Freq: Once | INTRAVENOUS | Status: AC
  Administered 2022-06-19: 3000 [IU] via INTRAVENOUS
  Filled 2022-06-19: qty 3000

## 2022-06-19 MED ORDER — SODIUM CHLORIDE 0.9 % IV BOLUS
1000.0000 mL | Freq: Once | INTRAVENOUS | Status: AC
  Administered 2022-06-19: 1000 mL via INTRAVENOUS

## 2022-06-19 MED ORDER — ALPRAZOLAM 0.5 MG PO TABS
0.5000 mg | ORAL_TABLET | Freq: Once | ORAL | Status: AC
  Administered 2022-06-19: 0.5 mg via ORAL
  Filled 2022-06-19: qty 1

## 2022-06-19 MED ORDER — LACTATED RINGERS IV SOLN: INTRAVENOUS | Status: AC

## 2022-06-19 NOTE — ED Provider Notes (Signed)
[facility] DEPT Provider Note   CSN: [id number] Arrival date & time: 06/18/22  2100     History  Chief Complaint  Patient presents with   Abdominal Pain    Stephanie [name] is a 71 y.o. female.  Patient is a 71 year old female with past medical history of diverticulitis, atrial fibrillation, hypertension, CHF, diabetes.  Patient presenting today with complaints of lower abdominal discomfort.  This has been worsening over the past 2 days.  She describes pressure to the lower abdomen in the suprapubic region.  She also describes drainage from her rectum, but no bleeding, fevers, or chills.  She denies urinary complaints.  She was diagnosed in April with diverticulitis and this feels similar.  Pain is worse with palpation and movement.  There are no alleviating factors.  The history is provided by the patient.       Home Medications Prior to Admission medications   Medication Sig Start Date End Date Taking? Authorizing Provider  Accu-Chek FastClix Lancets MISC  09/26/18   [provider]  allopurinol (ZYLOPRIM) 100 MG tablet TAKE 1/2 TABLET(50 MG) BY MOUTH TWICE DAILY 09/08/21   Gregor Hams, MD  ALPRAZolam Duanne Moron) 0.5 MG tablet Take 1 tablet (0.5 mg total) by mouth 3 (three) times daily as needed for anxiety. 03/18/22   [name], MD  Blood Pressure KIT 1 kit by Does not apply route daily. Check blood pressure once a day 03/11/21   Deboraha Sprang, MD  colchicine 0.6 MG tablet Take 0.6 mg by mouth daily as needed (gout). 12/25/21   [provider]  Continuous Blood Gluc Receiver ([place]) [place] 1 Act by Does not apply route daily. 06/10/22   [name], MD  Continuous Blood Gluc Sensor (DEXCOM G7 SENSOR) MISC 1 Act by Does not apply route daily. 06/10/22   [name], MD  dabigatran (PRADAXA) 150 MG CAPS capsule TAKE 1 CAPSULE BY MOUTH TWICE DAILY( EVERY TWELVE HOURS) 02/18/22   Deboraha Sprang, MD  dronedarone  (MULTAQ) 400 MG tablet Take 1 tablet (400 mg total) by mouth 2 (two) times daily. 06/18/22   Deboraha Sprang, MD  Finerenone (KERENDIA) 10 MG TABS Take 1 tablet by mouth daily. 06/10/22   [name], MD  furosemide (LASIX) 40 MG tablet Take 1 tablet (40 mg total) by mouth daily. 07/18/21   Shirley Friar, PA-C  hydrOXYzine (ATARAX) 10 MG tablet Take 1 tablet (10 mg total) by mouth every 8 (eight) hours as needed. 06/10/22   [name], MD  Insulin Glargine [facility] KWIKPEN) 100 UNIT/ML Inject 30 Units into the skin daily. 06/10/22   [name], MD  Insulin Pen Needle (NOVOFINE) 30G X 8 MM MISC For Humalog meal time TID 08/22/18   Tysinger, Camelia Eng, PA-C  JARDIANCE 25 MG TABS tablet Take 1 tablet (25 mg total) by mouth daily. 06/10/22   [name], MD  metoprolol succinate (TOPROL XL) 25 MG 24 hr tablet Take 1 tablet (25 mg total) by mouth daily. 07/18/21   Shirley Friar, PA-C  NOVOLOG FLEXPEN 100 UNIT/ML FlexPen Inject 5 Units into the skin 3 (three) times daily with meals. 11/16/20   Donne Hazel, MD  potassium chloride SA (KLOR-CON M15) 15 MEQ tablet Take 1 tablet (15 mEq total) by mouth 3 (three) times daily. 06/10/22   [name], MD  sacubitril-valsartan (ENTRESTO) 24-26 MG Take 1 tablet by mouth 2 (two) times daily.  06/18/22   Deboraha Sprang, MD  simvastatin (ZOCOR) 10 MG tablet Take 1 tablet (10 mg total) by mouth daily. 06/10/22   [name], MD      Allergies    Amiodarone, Aspirin, Methimazole, Propoxyphene, Trintellix [vortioxetine], Albuterol, Atorvastatin, and Livalo [pitavastatin]    Review of Systems   Review of Systems  All other systems reviewed and are negative.   Physical Exam Updated Vital Signs BP (!) 137/93   Pulse (!) 104   Temp 98.7 F (37.1 C) (Oral)   Resp 17   Ht '5\' 3"'  (1.6 m)   Wt 120.2 kg   SpO2 100%   BMI 46.94 kg/m  Physical Exam Vitals and nursing note reviewed.  Constitutional:      General: She is not in  acute distress.    Appearance: She is well-developed. She is not diaphoretic.  HENT:     Head: Normocephalic and atraumatic.  Cardiovascular:     Rate and Rhythm: Normal rate and regular rhythm.     Heart sounds: No murmur heard.    No friction rub. No gallop.  Pulmonary:     Effort: Pulmonary effort is normal. No respiratory distress.     Breath sounds: Normal breath sounds. No wheezing.  Abdominal:     General: Bowel sounds are normal. There is no distension.     Palpations: Abdomen is soft.     Tenderness: There is abdominal tenderness in the suprapubic area. There is no right CVA tenderness, left CVA tenderness, guarding or rebound.  Musculoskeletal:        General: Normal range of motion.     Cervical back: Normal range of motion and neck supple.  Skin:    General: Skin is warm and dry.  Neurological:     General: No focal deficit present.     Mental Status: She is alert and oriented to person, place, and time.     ED Results / Procedures / Treatments   Labs (all labs ordered are listed, but only abnormal results are displayed) Labs Reviewed  COMPREHENSIVE METABOLIC PANEL - Abnormal; Notable for the following components:      Result Value   Potassium 3.4 (*)    Glucose, Bld 233 (*)    Creatinine, Ser 1.34 (*)    AST 13 (*)    GFR, Estimated 43 (*)    All other components within normal limits  CBC WITH DIFFERENTIAL/PLATELET - Abnormal; Notable for the following components:   Hemoglobin 11.7 (*)    All other components within normal limits  LIPASE, BLOOD  URINALYSIS, ROUTINE W REFLEX MICROSCOPIC    EKG None  Radiology No results found.  Procedures Procedures    Medications Ordered in ED Medications  sodium chloride 0.9 % bolus 1,000 mL (has no administration in time range)    ED Course/ Medical Decision Making/ A&P  This patient presents to the ED for concern of lower abdominal pain, this involves an extensive number of treatment options, and is a  complaint that carries with it a high risk of complications and morbidity.  The differential diagnosis includes acute appendicitis, acute diverticulitis, small bowel obstruction, UTI   Co morbidities that complicate the patient evaluation  None   Additional history obtained:  No additional history or external records needed   Lab Tests:  I Ordered, and personally interpreted labs.  The pertinent results include: Glucose of 233, but CBC, CMP, and lipase are otherwise unremarkable.   Imaging Studies ordered:  I  ordered imaging studies including CT scan of the abdomen and pelvis I independently visualized and interpreted imaging which showed acute diverticulitis with a few small extraluminal foci of air consistent with micro perforations I agree with the radiologist interpretation   Cardiac Monitoring: / EKG:  None performed   Consultations Obtained:  I requested consultation with the hospitalist,  and discussed lab and imaging findings as well as pertinent plan - they recommend: Admission for IV antibiotics   Problem List / ED Course / Critical interventions / Medication management  Patient presenting with lower abdominal pain.  She has history of diverticulitis and this feels similar.  She has no fever or white count, but CT scan does show diverticulitis with evidence for microperforation.  Due to patient's age, presence of microperforations I feel as though she is at risk for complications and that she should be admitted for IV antibiotics.  I spoke with Dr. Hal Hope who agrees to admit I ordered medication including Zosyn for infection Reevaluation of the patient after these medicines showed that the patient improved I have reviewed the patients home medicines and have made adjustments as needed   Social Determinants of Health:  None   Test / Admission - Considered:  Patient to be admitted to the hospitalist service for IV antibiotics and close  monitoring.  Final Clinical Impression(s) / ED Diagnoses Final diagnoses:  None    Rx / DC Orders ED Discharge Orders     None         Veryl Speak, MD 06/19/22 [phone]

## 2022-06-19 NOTE — Progress Notes (Addendum)
Pharmacy Brief Note - Evening Anticoagulation Follow Up:  Pt is a 19 yoF on heparin for atrial fibrillation while home Pradaxa is on hold. [facility] consulted, planning for conservative management at this time. For full history, see note by Leone Haven, PharmD from earlier today.   Assessment: HL = 0.49 is therapeutic on heparin infusion of 1250 units/hr Confirmed with RN that heparin infusing at correct rate. No line issues. No signs of bleeding.   Goal: HL 0.3 - 0.7  Plan: Continue heparin at current rate of 1250 units/hr Check confirmatory HL and CBC in 6-8 hours CBC, HL daily while on heparin Monitor for signs of bleeding  If surgical intervention is required, will need guidance from CCS on when to hold heparin peri-operatively.   Lenis Noon, PharmD 06/19/22 3:36 PM  Addendum:  Confirmatory HL = 0.45 remains therapeutic Confirmed with RN that heparin infusing at correct rate. No signs of bleeding. No line issues.   Plan: Continue heparin infusion at 1250 units/hr Daily HL, CBC  Lenis Noon, PharmD 06/19/22 9:55 PM

## 2022-06-19 NOTE — Consult Note (Signed)
Reason for Consult: Sigmoid diverticulitis with microperforation Referring Physician: Dr. Megan Mans is an 71 y.o. female.  HPI: This is a 71 year old female with multiple medical issues including coronary artery disease, diabetes type 2, atrial fibrillation on Pradaxa, nonischemic cardiomyopathy who presents with what sounds like her second or third attack of sigmoid diverticulitis within the last year.  Earlier in the year, she was treated as an outpatient with oral antibiotics.  This was unsuccessful so she required hospitalization earlier in the year.  She presents now with 48 hours of suprapubic and left lower quadrant abdominal pain associated with bowel movements containing mucus.  She denied any nausea, vomiting, fever, chills.  She presented to the emergency department for evaluation.  CT scan shows recurrent sigmoid diverticulitis with evidence of microperforation.  No large abscess.  She has been admitted by [facility] hospitalist.  They have her on a heparin drip while the Pradaxa is being held.  Past Medical History:  Diagnosis Date   Anemia    Ankle fracture, right    x 2   Antineoplastic and immunosuppressive drugs causing adverse effect in therapeutic use    Asthma    Chronic venous hypertension without complications    Colon polyps 2009   Coronary artery disease    no CAD by cath 11.2010   Depressive disorder, not elsewhere classified    Diabetes mellitus type 2 in obese (HCC)    hgb AIC 6.1 09/02/2009, insulin dependent    Diastolic CHF, chronic (HCC)    reports stable   Diverticulosis of colon 2009   colonoscopy   Gout    cortisone  shots helped   Hepatitis, unspecified    hx of/per pt, never was told of having hepatitis   Hx of hysterectomy 2002   Hyperlipidemia    Hypertension    controlled on medication    Hypothyroidism    reports her thryoid levels are normal now    Irritable bowel syndrome    Lung nodule    80m RLL nodule on CT Chest  07/2009,  resolved by 2011 CT   Noncompliance    Obesity    [place]istent [place] ([place]    s/p DCCV 07/2009; Pradaxa Rx, states she is in sinus rhythm now    PONV (postoperative nausea and vomiting)    after receiving "too much anesthesia" after a hernia surgery    Sarcoidosis    dx by eye exam; seen during eye exam , report asymtptomatic "i dont have it now'    Sleep disturbance 06/2013   sleep study, mild abnormality, no criteria for CPAP    Past Surgical History:  Procedure Laterality Date   ABDOMINAL HYSTERECTOMY     CARDIOVERSION     x 2   CARDIOVERSION N/A 02/27/2013   Procedure: CARDIOVERSION;  Surgeon: BLelon Perla MD;  [place]  Service: Cardiovascular;  Laterality: N/A;   CARDIOVERSION N/A 03/24/2017   Procedure: CARDIOVERSION;  Surgeon: NJosue Hector MD;  [facility]  Service: Cardiovascular;  Laterality: N/A;   CARDIOVERSION N/A 03/04/2020   Procedure: CARDIOVERSION;  Surgeon: NJosue Hector MD;  [facility];  Service: Cardiovascular;  Laterality: N/A;   CHOLECYSTECTOMY  early 80s   COLONOSCOPY     Diverticulosis, colon polyps, repeat 2014, Dr. KDeatra Ina  COLONOSCOPY WITH PROPOFOL N/A 07/25/2019   Procedure: COLONOSCOPY WITH PROPOFOL;  Surgeon: AYetta Flock MD;  Location: [facility];  Service: Gastroenterology;  Laterality: N/A;   ESOPHAGOGASTRODUODENOSCOPY N/A  05/16/2014   Procedure: ESOPHAGOGASTRODUODENOSCOPY (EGD);  Surgeon: Wonda Horner, MD;  Location: [facility];  Service: Gastroenterology;  Laterality: N/A;   HERNIA REPAIR  9432   umbilical hernia   MYOMECTOMY  2000   POLYPECTOMY  07/25/2019   Procedure: POLYPECTOMY;  Surgeon: Yetta Flock, MD;  Location: [facility];  Service: Gastroenterology;;    Family History  Problem Relation Age of Onset   Pneumonia Father        deceased   Hypertension Father    Cancer Father    Multiple sclerosis Mother        hx   Emphysema Other        runs in the family     Social History:  reports that she has never smoked. She has never used smokeless tobacco. She reports current alcohol use. She reports that she does not use drugs.  Allergies:  Allergies  Allergen Reactions   Amiodarone Other (See Comments)    adverse reaction: Tremor/gait disurbance   Aspirin Other (See Comments)    "Burns my stomach"   Methimazole Nausea And Vomiting   Propoxyphene Nausea And Vomiting   Trintellix [Vortioxetine] Nausea And Vomiting   Albuterol Palpitations   Atorvastatin Other (See Comments)    Body aches and pains--stiffness.    Livalo [Pitavastatin]     Body aches    Medications:  Prior to Admission medications   Medication Sig Start Date End Date Taking? Authorizing Provider  allopurinol (ZYLOPRIM) 100 MG tablet TAKE 1/2 TABLET(50 MG) BY MOUTH TWICE DAILY Patient taking differently: Take 50 mg by mouth 2 (two) times daily as needed (for gout flare ups). 09/08/21  Yes Gregor Hams, MD  ALPRAZolam Duanne Moron) 0.5 MG tablet Take 1 tablet (0.5 mg total) by mouth 3 (three) times daily as needed for anxiety. Patient taking differently: Take 0.5 mg by mouth at bedtime as needed for sleep. 03/18/22  Yes [name], MD  colchicine 0.6 MG tablet Take 0.6 mg by mouth daily as needed (gout). 12/25/21  Yes [provider]  dabigatran (PRADAXA) 150 MG CAPS capsule TAKE 1 CAPSULE BY MOUTH TWICE DAILY( EVERY TWELVE HOURS) Patient taking differently: Take 150 mg by mouth 2 (two) times daily. 02/18/22  Yes Deboraha Sprang, MD  dronedarone (MULTAQ) 400 MG tablet Take 1 tablet (400 mg total) by mouth 2 (two) times daily. 06/18/22  Yes Deboraha Sprang, MD  furosemide (LASIX) 40 MG tablet Take 1 tablet (40 mg total) by mouth daily. 07/18/21  Yes Shirley Friar, PA-C  Insulin Glargine [facility] KWIKPEN) 100 UNIT/ML Inject 30 Units into the skin daily. 06/10/22  Yes [name], MD  JARDIANCE 25 MG TABS tablet Take 1 tablet (25 mg total) by mouth daily. 06/10/22   Yes [name], MD  metoprolol succinate (TOPROL XL) 25 MG 24 hr tablet Take 1 tablet (25 mg total) by mouth daily. 07/18/21  Yes Shirley Friar, PA-C  potassium chloride SA (KLOR-CON M15) 15 MEQ tablet Take 1 tablet (15 mEq total) by mouth 3 (three) times daily. 06/10/22  Yes [name], MD  sacubitril-valsartan (ENTRESTO) 24-26 MG Take 1 tablet by mouth 2 (two) times daily. 06/18/22  Yes Deboraha Sprang, MD  simvastatin (ZOCOR) 10 MG tablet Take 1 tablet (10 mg total) by mouth daily. 06/10/22  Yes [name], MD  Accu-Chek FastClix Lancets MISC  09/26/18   [provider]  Blood Pressure KIT 1 kit by Does not apply route  daily. Check blood pressure once a day 03/11/21   Deboraha Sprang, MD  Continuous Blood Gluc Receiver ([place]) [place] 1 Act by Does not apply route daily. 06/10/22   [name], MD  Continuous Blood Gluc Sensor (DEXCOM G7 SENSOR) MISC 1 Act by Does not apply route daily. 06/10/22   [name], MD  Finerenone (KERENDIA) 10 MG TABS Take 1 tablet by mouth daily. Patient not taking: Reported on 06/19/2022 06/10/22   [name], MD  hydrOXYzine (ATARAX) 10 MG tablet Take 1 tablet (10 mg total) by mouth every 8 (eight) hours as needed. Patient not taking: Reported on 06/19/2022 06/10/22   [name], MD  Insulin Pen Needle (NOVOFINE) 30G X 8 MM MISC For Humalog meal time TID 08/22/18   Tysinger, Camelia Eng, PA-C  NOVOLOG FLEXPEN 100 UNIT/ML FlexPen Inject 5 Units into the skin 3 (three) times daily with meals. Patient not taking: Reported on 06/19/2022 11/16/20   Donne Hazel, MD     Results for orders placed or performed during the hospital encounter of 06/18/22 (from the past 48 hour(s))  Urinalysis, Routine w reflex microscopic Urine, Clean Catch     Status: Abnormal   Collection Time: 06/18/22  9:22 PM  Result Value Ref Range   Color, Urine STRAW (A) YELLOW   APPearance CLEAR CLEAR   Specific Gravity, Urine 1.036 (H) 1.005 - 1.030    pH 5.0 5.0 - 8.0   Glucose, UA >=500 (A) NEGATIVE mg/dL   Hgb urine dipstick NEGATIVE NEGATIVE   Bilirubin Urine NEGATIVE NEGATIVE   Ketones, ur 20 (A) NEGATIVE mg/dL   Protein, ur NEGATIVE NEGATIVE mg/dL   Nitrite NEGATIVE NEGATIVE   Leukocytes,Ua NEGATIVE NEGATIVE   RBC / HPF 0-5 0 - 5 RBC/hpf   WBC, UA 0-5 0 - 5 WBC/hpf   Bacteria, UA NONE SEEN NONE SEEN   Squamous Epithelial / LPF 0-5 0 - 5    Comment: Performed at [facility], [place] [address] [place], [place] [address]  Comprehensive metabolic panel     Status: Abnormal   Collection Time: 06/18/22  9:36 PM  Result Value Ref Range   Sodium 140 135 - 145 mmol/L   Potassium 3.4 (L) 3.5 - 5.1 mmol/L   Chloride 107 98 - 111 mmol/L   CO2 26 22 - 32 mmol/L   Glucose, Bld 233 (H) 70 - 99 mg/dL    Comment: Glucose reference range applies only to samples taken after fasting for at least 8 hours.   BUN 22 8 - 23 mg/dL   Creatinine, Ser 1.34 (H) 0.44 - 1.00 mg/dL   Calcium 9.3 8.9 - 10.3 mg/dL   Total Protein 7.6 6.5 - 8.1 g/dL   Albumin 3.6 3.5 - 5.0 g/dL   AST 13 (L) 15 - 41 U/L   ALT 10 0 - 44 U/L   Alkaline Phosphatase 82 38 - 126 U/L   Total Bilirubin 0.8 0.3 - 1.2 mg/dL   GFR, Estimated 43 (L) >60 mL/min    Comment: (NOTE) Calculated using the CKD-EPI Creatinine Equation (2021)    Anion gap 7 5 - 15    Comment: Performed at [facility], [place] [address], [place], [place] [address]  Lipase, blood     Status: None   Collection Time: 06/18/22  9:36 PM  Result Value Ref Range   Lipase 31 11 - 51 U/L    Comment: Performed at [facility], [place] [address] [place], [place] [address]  CBC with Diff     Status: Abnormal   Collection Time: 06/18/22  9:36 PM  Result Value Ref Range   WBC 7.0 4.0 - 10.5 K/uL   RBC 3.89 3.87 - 5.11 MIL/uL   Hemoglobin 11.7 (L) 12.0 - 15.0 g/dL   HCT 37.0 36.0 - 46.0 %   MCV 95.1 80.0 - 100.0 fL   MCH 30.1 26.0 - 34.0 pg   MCHC 31.6 30.0  - 36.0 g/dL   RDW 13.5 11.5 - 15.5 %   Platelets 235 150 - 400 K/uL   nRBC 0.0 0.0 - 0.2 %   Neutrophils Relative % 60 %   Neutro Abs 4.2 1.7 - 7.7 K/uL   Lymphocytes Relative 32 %   Lymphs Abs 2.3 0.7 - 4.0 K/uL   Monocytes Relative 7 %   Monocytes Absolute 0.5 0.1 - 1.0 K/uL   Eosinophils Relative 1 %   Eosinophils Absolute 0.1 0.0 - 0.5 K/uL   Basophils Relative 0 %   Basophils Absolute 0.0 0.0 - 0.1 K/uL   Immature Granulocytes 0 %   Abs Immature Granulocytes 0.02 0.00 - 0.07 K/uL    Comment: Performed at [facility], [place] [address] [place], [place] [address]  CBG monitoring, ED     Status: Abnormal   Collection Time: 06/19/22  7:24 AM  Result Value Ref Range   Glucose-Capillary 167 (H) 70 - 99 mg/dL    Comment: Glucose reference range applies only to samples taken after fasting for at least 8 hours.    CT ABDOMEN PELVIS W CONTRAST  Result Date: 06/19/2022 CLINICAL DATA:  Abdominal pain for 2 days EXAM: CT ABDOMEN AND PELVIS WITH CONTRAST TECHNIQUE: Multidetector CT imaging of the abdomen and pelvis was performed using the standard protocol following bolus administration of intravenous contrast. RADIATION DOSE REDUCTION: This exam was performed according to the departmental dose-optimization program which includes automated exposure control, adjustment of the mA and/or kV according to patient size and/or use of iterative reconstruction technique. CONTRAST:  27m OMNIPAQUE IOHEXOL 300 MG/ML  SOLN COMPARISON:  01/26/2022 FINDINGS: Lower chest: No acute abnormality. Hepatobiliary: No focal liver abnormality is seen. Status post cholecystectomy. No biliary dilatation. Pancreas: Unremarkable. No pancreatic ductal dilatation or surrounding inflammatory changes. Spleen: Normal in size without focal abnormality. Adrenals/Urinary Tract: Adrenal glands are within normal limits. Kidneys demonstrate a normal enhancement pattern. No renal calculi or obstructive changes are seen.  Bladder is partially distended. Stomach/Bowel: Diverticular change is seen. Some surrounding inflammatory changes are noted at the junction of the rectum and sigmoid consistent with diverticulitis. Few small foci of air are noted which likely represent micro perforations. No abscess is seen. The stomach is within normal limits. Small bowel is unremarkable. The appendix is within normal limits. Stomach is within normal limits. Vascular/Lymphatic: No significant vascular findings are present. No enlarged abdominal or pelvic lymph nodes. Reproductive: Status post hysterectomy. No adnexal masses. Other: No free fluid is noted. Musculoskeletal: Degenerative changes of lumbar spine are noted. No rib abnormality is seen. IMPRESSION: Diverticulitis similar to that noted on the prior exam. A few small extraluminal foci of air are noted consistent with micro perforations. No abscess is noted. Postsurgical changes. Electronically Signed   By: MInez CatalinaM.D.   On: 06/19/2022 03:07    Review of Systems  Constitutional:  Negative for chills, fever and unexpected weight change.  HENT:  Negative for congestion, ear discharge, ear pain, hearing  loss, sore throat, tinnitus, trouble swallowing and voice change.   Eyes:  Negative for photophobia, pain and visual disturbance.  Respiratory:  Negative for cough, shortness of breath and wheezing.   Cardiovascular:  Negative for chest pain, palpitations and leg swelling.  Gastrointestinal:  Positive for abdominal distention and abdominal pain. Negative for anal bleeding, blood in stool, constipation, diarrhea, nausea and vomiting.  Genitourinary:  Negative for difficulty urinating, dysuria, flank pain, frequency, hematuria, urgency and vaginal bleeding.  Musculoskeletal:  Negative for arthralgias, back pain, myalgias and neck pain.  Skin:  Negative for rash and wound.  Neurological:  Negative for dizziness, seizures, syncope and headaches.  Hematological:  Negative for  adenopathy. Does not bruise/bleed easily.  Psychiatric/Behavioral:  Negative for confusion. The patient is not nervous/anxious.    Blood pressure 131/69, pulse 88, temperature 97.6 F (36.4 C), temperature source Oral, resp. rate 18, height '5\' 3"'  (1.6 m), weight 120.2 kg, SpO2 98 %. Physical Exam Constitutional:  WDWN in NAD, conversant, no obvious deformities; lying in bed comfortably Eyes:  Pupils equal, round; sclera anicteric; moist conjunctiva; no lid lag HENT:  Oral mucosa moist; good dentition  Neck:  No masses palpated, trachea midline; no thyromegaly Lungs:  CTA bilaterally; normal respiratory effort CV:  Regular rate and rhythm; no murmurs; extremities well-perfused with no edema Abd:  +bowel sounds, obese, soft, tender in suprapubic and LLQ Musc:  Unable to assess gait; no apparent clubbing or cyanosis in extremities Lymphatic:  No palpable cervical or axillary lymphadenopathy Skin:  Warm, dry; no sign of jaundice Psychiatric - alert and oriented x 4; calm mood and affect  Assessment/Plan: Recurrent sigmoid diverticulitis with some areas of extraluminal air, consistent with microperforation.  Recommendations: Bowel rest - NPO x ice chips IV abx - patient is on Zosyn We discussed her current situation.  We will attempt to treat her diverticulitis with conservative measures including bowel rest, IV antibiotics, IV hydration.  However, if her condition worsens, she may need urgent or emergent surgery.  This would likely involve resection of the diseased segment of the colon with probable descending colostomy on a temporary basis.  Agree with holding the Pradaxa and using heparin drip at this time.  Would ask cardiology to evaluate the patient to determine her risk for general anesthesia if we would need to do surgery.  Imogene Burn Sagan Maselli 06/19/2022, 10:14 AM

## 2022-06-19 NOTE — Progress Notes (Signed)
ANTICOAGULATION CONSULT NOTE - Initial Consult  Pharmacy Consult for Heparin(on Pradaxa PTA)  Indication: atrial fibrillation    Allergies  Allergen Reactions   Amiodarone Other (See Comments)    adverse reaction: Tremor/gait disurbance   Aspirin Other (See Comments)    "Burns my stomach"   Methimazole Nausea And Vomiting   Propoxyphene Nausea And Vomiting   Trintellix [Vortioxetine] Nausea And Vomiting   Albuterol Palpitations   Atorvastatin Other (See Comments)    Body aches and pains--stiffness.    Livalo [Pitavastatin]     Body aches    Patient Measurements: Height: '5\' 3"'$  (160 cm) Weight: 120.2 kg (265 lb) IBW/kg (Calculated) : 52.4 Heparin Dosing Weight:  81 kg  Vital Signs: Temp: 99.2 F (37.3 C) (09/01 0320) Temp Source: Oral (09/01 0320) BP: 125/96 (09/01 0615) Pulse Rate: 89 (09/01 0615)  Labs: Recent Labs    06/18/22 2136  HGB 11.7*  HCT 37.0  PLT 235  CREATININE 1.34*    Estimated Creatinine Clearance: 49 mL/min (A) (by C-G formula based on SCr of 1.34 mg/dL (H)).   Medical History: Past Medical History:  Diagnosis Date   Anemia    Ankle fracture, right    x 2   Antineoplastic and immunosuppressive drugs causing adverse effect in therapeutic use    Asthma    Chronic venous hypertension without complications    Colon polyps 2009   Coronary artery disease    no CAD by cath 11.2010   Depressive disorder, not elsewhere classified    Diabetes mellitus type 2 in obese (HCC)    hgb AIC 6.1 09/02/2009, insulin dependent    Diastolic CHF, chronic ([place])    reports stable   Diverticulosis of colon 2009   colonoscopy   Gout    cortisone  shots helped   Hepatitis, unspecified    hx of/per pt, never was told of having hepatitis   Hx of hysterectomy 2002   Hyperlipidemia    Hypertension    controlled on medication    Hypothyroidism    reports her thryoid levels are normal now    Irritable bowel syndrome    Lung nodule    76m RLL nodule on CT  Chest  07/2009, resolved by 2011 CT   Noncompliance    Obesity    Persistent atrial fibrillation ([place]    s/p DCCV 07/2009; Pradaxa Rx, states she is in sinus rhythm now    PONV (postoperative nausea and vomiting)    after receiving "too much anesthesia" after a hernia surgery    Sarcoidosis    dx by eye exam; seen during eye exam , report asymtptomatic "i dont have it now'    Sleep disturbance 06/2013   sleep study, mild abnormality, no criteria for CPAP    Medications:  PTA Pradaxa '150mg'$  BID - LD 8/30 pm  Assessment: 89yr female admitted with diverticulitis with microperforation.  General surgery to be consulted.  Patient on Pradaxa PTA for AFib which is held on admission and pharmacy consulted to dose IV heparin  Goal of Therapy:  Heparin level 0.3-0.7 units/ml Monitor platelets by anticoagulation protocol: Yes   Plan:  Baseline aPTT and PT/INR As last Pradaxa dose > 24 hours, will give Heparin 3000 unit IV bolus x 1 followed by heparin gtt @ 1250 units/hr Check heparin level 8 hr after heparin gtt started Daily Heparin level & CBC Monitor for signs & symptoms of bleeding  Ellie Spickler, LToribio Harbour PharmD 06/19/2022,6:31 AM

## 2022-06-19 NOTE — Progress Notes (Signed)
PROGRESS NOTE    Stephanie [name]  EQA:[id number] DOB: Mar 27, 1951 DOA: 06/18/2022 PCP: [name], MD  Outpatient Specialists:     Brief Narrative:  As per H&P done earlier today: "Stephanie [name] is a 71 y.o. female with history of nonischemic cardiomyopathy, persistent atrial fibrillation on Pradaxa, hypertension, diabetes mellitus type 2 presents to the ER because of lower quadrant abdominal pain over the last 48 hours has been having increasing bowel movement which is mucousy in nature.  Denies any nausea vomiting or fever chills.   ED Course: In the ER on exam patient has tenderness of the left lower quadrant and suprapubic area.  CT abdomen pelvis shows sigmoid diverticulitis with microperforation.  Patient was started on antibiotics admitted for further management".  Patient seen alongside patient's nurse.  Abdominal pain is improving.  No fever or chills.  No nausea or vomiting.  No diarrhea.  Patient is on IV antibiotics.  Surgical team's input is appreciated, conservative approach is advised for now.  Patient be kept n.p.o. except for ice chips continue IV antibiotics.  If condition worsens, surgical team plans to proceed with surgery.   Assessment & Plan:   Principal Problem:   Diverticulitis large intestine Active Problems:   Atrial fibrillation (HCC)   NICM (nonischemic cardiomyopathy) (HCC)   Stage 3a chronic kidney disease (HCC)   DVT prophylaxis: Current.  Pradaxa is on hold. Code Status: Full code Family Communication:  Disposition Plan: Home eventually   Consultants:  Surgical team  Procedures:  None  Antimicrobials:  IV Zosyn   Subjective: No fever or chills No nausea or vomiting No abdominal pain.  Objective: Vitals:   06/19/22 0615 06/19/22 0630 06/19/22 0645 06/19/22 0733  BP: (!) 125/96 139/65 131/69   Pulse: 89 86 88   Resp: '15 17 18   '$ Temp:    97.6 F (36.4 C)  TempSrc:    Oral  SpO2: 96% 96% 98%   Weight:      Height:       No  intake or output data in the 24 hours ending 06/19/22 0829 Filed Weights   06/18/22 2119  Weight: 120.2 kg       Data Reviewed: I have personally reviewed following labs and imaging studies  CBC: Recent Labs  Lab 06/18/22 2136  WBC 7.0  NEUTROABS 4.2  HGB 11.7*  HCT 37.0  MCV 95.1  PLT 979   Basic Metabolic Panel: Recent Labs  Lab 06/18/22 2136  NA 140  K 3.4*  CL 107  CO2 26  GLUCOSE 233*  BUN 22  CREATININE 1.34*  CALCIUM 9.3   GFR: Estimated Creatinine Clearance: 49 mL/min (A) (by C-G formula based on SCr of 1.34 mg/dL (H)). Liver Function Tests: Recent Labs  Lab 06/18/22 2136  AST 13*  ALT 10  ALKPHOS 82  BILITOT 0.8  PROT 7.6  ALBUMIN 3.6   Recent Labs  Lab 06/18/22 2136  LIPASE 31   No results for input(s): "AMMONIA" in the last 168 hours. Coagulation Profile: No results for input(s): "INR", "PROTIME" in the last 168 hours. Cardiac Enzymes: No results for input(s): "CKTOTAL", "CKMB", "CKMBINDEX", "TROPONINI" in the last 168 hours. BNP (last 3 results) Recent Labs    07/18/21 1204  PROBNP 203   HbA1C: No results for input(s): "HGBA1C" in the last 72 hours. CBG: Recent Labs  Lab 06/19/22 0724  GLUCAP 167*   Lipid Profile: No results for input(s): "CHOL", "HDL", "LDLCALC", "TRIG", "CHOLHDL", "LDLDIRECT" in the last 72 hours.  Thyroid Function Tests: No results for input(s): "TSH", "T4TOTAL", "FREET4", "T3FREE", "THYROIDAB" in the last 72 hours. Anemia Panel: No results for input(s): "VITAMINB12", "FOLATE", "FERRITIN", "TIBC", "IRON", "RETICCTPCT" in the last 72 hours. Urine analysis:    Component Value Date/Time   COLORURINE STRAW (A) 06/18/2022 2122   APPEARANCEUR CLEAR 06/18/2022 2122   LABSPEC 1.036 (H) 06/18/2022 2122   PHURINE 5.0 06/18/2022 2122   GLUCOSEU >=500 (A) 06/18/2022 2122   HGBUR NEGATIVE 06/18/2022 2122   HGBUR negative 09/02/2009 1428   BILIRUBINUR NEGATIVE 06/18/2022 2122   BILIRUBINUR negative 06/22/2018  1454   BILIRUBINUR n 08/02/2015 1125   KETONESUR 20 (A) 06/18/2022 2122   PROTEINUR NEGATIVE 06/18/2022 2122   UROBILINOGEN negative 08/02/2015 1125   UROBILINOGEN 0.2 08/16/2011 2150   NITRITE NEGATIVE 06/18/2022 2122   LEUKOCYTESUR NEGATIVE 06/18/2022 2122   Sepsis Labs: '@LABRCNTIP'$ (procalcitonin:4,lacticidven:4)  )No results found for this or any previous visit (from the past 240 hour(s)).       Radiology Studies: CT ABDOMEN PELVIS W CONTRAST  Result Date: 06/19/2022 CLINICAL DATA:  Abdominal pain for 2 days EXAM: CT ABDOMEN AND PELVIS WITH CONTRAST TECHNIQUE: Multidetector CT imaging of the abdomen and pelvis was performed using the standard protocol following bolus administration of intravenous contrast. RADIATION DOSE REDUCTION: This exam was performed according to the departmental dose-optimization program which includes automated exposure control, adjustment of the mA and/or kV according to patient size and/or use of iterative reconstruction technique. CONTRAST:  75m OMNIPAQUE IOHEXOL 300 MG/ML  SOLN COMPARISON:  01/26/2022 FINDINGS: Lower chest: No acute abnormality. Hepatobiliary: No focal liver abnormality is seen. Status post cholecystectomy. No biliary dilatation. Pancreas: Unremarkable. No pancreatic ductal dilatation or surrounding inflammatory changes. Spleen: Normal in size without focal abnormality. Adrenals/Urinary Tract: Adrenal glands are within normal limits. Kidneys demonstrate a normal enhancement pattern. No renal calculi or obstructive changes are seen. Bladder is partially distended. Stomach/Bowel: Diverticular change is seen. Some surrounding inflammatory changes are noted at the junction of the rectum and sigmoid consistent with diverticulitis. Few small foci of air are noted which likely represent micro perforations. No abscess is seen. The stomach is within normal limits. Small bowel is unremarkable. The appendix is within normal limits. Stomach is within normal  limits. Vascular/Lymphatic: No significant vascular findings are present. No enlarged abdominal or pelvic lymph nodes. Reproductive: Status post hysterectomy. No adnexal masses. Other: No free fluid is noted. Musculoskeletal: Degenerative changes of lumbar spine are noted. No rib abnormality is seen. IMPRESSION: Diverticulitis similar to that noted on the prior exam. A few small extraluminal foci of air are noted consistent with micro perforations. No abscess is noted. Postsurgical changes. Electronically Signed   By: MInez CatalinaM.D.   On: 06/19/2022 03:07        Scheduled Meds:  insulin aspart  0-6 Units Subcutaneous Q4H   insulin glargine-yfgn  10 Units Subcutaneous Daily   metoprolol tartrate  5 mg Intravenous Q6H   Continuous Infusions:  heparin 1,250 Units/hr (06/19/22 0650)   lactated ringers 75 mL/hr at 06/19/22 0820   piperacillin-tazobactam (ZOSYN)  IV       LOS: 0 days     SDana Allan MD  [facility] Pager #: [phone] contact night coverage as above

## 2022-06-19 NOTE — Progress Notes (Signed)
Pharmacy Antibiotic Note  Stephanie [name] is a 71 y.o. female admitted on 06/18/2022 with diverticulitis with microperforation.  Pharmacy has been consulted for Zosyn dosing.  Plan: Zosyn 3.375g IV q8h (4 hour infusion). Need for further dosage adjustment appears unlikely at present.    Will sign off at this time.  Please reconsult if a change in clinical status warrants re-evaluation of dosage.  Height: '5\' 3"'$  (160 cm) Weight: 120.2 kg (265 lb) IBW/kg (Calculated) : 52.4  Temp (24hrs), Avg:98.9 F (37.2 C), Min:98.7 F (37.1 C), Max:99.2 F (37.3 C)  Recent Labs  Lab 06/18/22 2136  WBC 7.0  CREATININE 1.34*    Estimated Creatinine Clearance: 49 mL/min (A) (by C-G formula based on SCr of 1.34 mg/dL (H)).    Allergies  Allergen Reactions   Amiodarone Other (See Comments)    adverse reaction: Tremor/gait disurbance   Aspirin Other (See Comments)    "Burns my stomach"   Methimazole Nausea And Vomiting   Propoxyphene Nausea And Vomiting   Trintellix [Vortioxetine] Nausea And Vomiting   Albuterol Palpitations   Atorvastatin Other (See Comments)    Body aches and pains--stiffness.    Livalo [Pitavastatin]     Body aches      Thank you for allowing pharmacy to be a part of this patient's care.  Everette Rank, PharmD 06/19/2022 6:44 AM

## 2022-06-19 NOTE — H&P (Signed)
History and Physical    Jacklyn Branan GGE:[id number] DOB: Sep 05, 1951 DOA: 06/18/2022  PCP: [name], MD  Patient coming from: Home.  Chief Complaint: Abdominal pain.  HPI: Stephanie [name] is a 71 y.o. female with history of nonischemic cardiomyopathy, persistent atrial fibrillation on Pradaxa, hypertension, diabetes mellitus type 2 presents to the ER because of lower quadrant abdominal pain over the last 48 hours has been having increasing bowel movement which is mucousy in nature.  Denies any nausea vomiting or fever chills.  ED Course: In the ER on exam patient has tenderness of the left lower quadrant and suprapubic area.  CT abdomen pelvis shows sigmoid diverticulitis with microperforation.  Patient was started on antibiotics admitted for further management.  Review of Systems: As per HPI, rest all negative.   Past Medical History:  Diagnosis Date   Anemia    Ankle fracture, right    x 2   Antineoplastic and immunosuppressive drugs causing adverse effect in therapeutic use    Asthma    Chronic venous hypertension without complications    Colon polyps 2009   Coronary artery disease    no CAD by cath 11.2010   Depressive disorder, not elsewhere classified    Diabetes mellitus type 2 in obese (HCC)    hgb AIC 6.1 09/02/2009, insulin dependent    Diastolic CHF, chronic (HCC)    reports stable   Diverticulosis of colon 2009   colonoscopy   Gout    cortisone  shots helped   Hepatitis, unspecified    hx of/per pt, never was told of having hepatitis   Hx of hysterectomy 2002   Hyperlipidemia    Hypertension    controlled on medication    Hypothyroidism    reports her thryoid levels are normal now    Irritable bowel syndrome    Lung nodule    13m RLL nodule on CT Chest  07/2009, resolved by 2011 CT   Noncompliance    Obesity    Persistent atrial fibrillation ([place]    s/p DCCV 07/2009; Pradaxa Rx, states she is in sinus rhythm now    PONV (postoperative nausea  and vomiting)    after receiving "too much anesthesia" after a hernia surgery    Sarcoidosis    dx by eye exam; seen during eye exam , report asymtptomatic "i dont have it now'    Sleep disturbance 06/2013   sleep study, mild abnormality, no criteria for CPAP    Past Surgical History:  Procedure Laterality Date   ABDOMINAL HYSTERECTOMY     CARDIOVERSION     x 2   CARDIOVERSION N/A 02/27/2013   Procedure: CARDIOVERSION;  Surgeon: BLelon Perla MD;  [place] [facility]  Service: Cardiovascular;  Laterality: N/A;   CARDIOVERSION N/A 03/24/2017   Procedure: CARDIOVERSION;  Surgeon: NJosue Hector MD;  [facility];  Service: Cardiovascular;  Laterality: N/A;   CARDIOVERSION N/A 03/04/2020   Procedure: CARDIOVERSION;  Surgeon: NJosue Hector MD;  [facility];  Service: Cardiovascular;  Laterality: N/A;   CHOLECYSTECTOMY  early 80s   COLONOSCOPY     Diverticulosis, colon polyps, repeat 2014, Dr. KDeatra Ina  COLONOSCOPY WITH PROPOFOL N/A 07/25/2019   Procedure: COLONOSCOPY WITH PROPOFOL;  Surgeon: AYetta Flock MD;  Location: [facility];  Service: Gastroenterology;  Laterality: N/A;   ESOPHAGOGASTRODUODENOSCOPY N/A 05/16/2014   Procedure: ESOPHAGOGASTRODUODENOSCOPY (EGD);  Surgeon: SWonda Horner MD;  Location: [facility];  Service: Gastroenterology;  Laterality: N/A;   HERNIA REPAIR  2707   umbilical hernia   MYOMECTOMY  2000   POLYPECTOMY  07/25/2019   Procedure: POLYPECTOMY;  Surgeon: Yetta Flock, MD;  Location: [facility];  Service: Gastroenterology;;     reports that she has never smoked. She has never used smokeless tobacco. She reports current alcohol use. She reports that she does not use drugs.  Allergies  Allergen Reactions   Amiodarone Other (See Comments)    adverse reaction: Tremor/gait disurbance   Aspirin Other (See Comments)    "Burns my stomach"   Methimazole Nausea And Vomiting   Propoxyphene Nausea And Vomiting   Trintellix  [Vortioxetine] Nausea And Vomiting   Albuterol Palpitations   Atorvastatin Other (See Comments)    Body aches and pains--stiffness.    Livalo [Pitavastatin]     Body aches    Family History  Problem Relation Age of Onset   Pneumonia Father        deceased   Hypertension Father    Cancer Father    Multiple sclerosis Mother        hx   Emphysema Other        runs in the family    Prior to Admission medications   Medication Sig Start Date End Date Taking? Authorizing Provider  allopurinol (ZYLOPRIM) 100 MG tablet TAKE 1/2 TABLET(50 MG) BY MOUTH TWICE DAILY Patient taking differently: Take 50 mg by mouth 2 (two) times daily as needed (for gout flare ups). 09/08/21  Yes Gregor Hams, MD  ALPRAZolam Duanne Moron) 0.5 MG tablet Take 1 tablet (0.5 mg total) by mouth 3 (three) times daily as needed for anxiety. Patient taking differently: Take 0.5 mg by mouth at bedtime as needed for sleep. 03/18/22  Yes [name], MD  colchicine 0.6 MG tablet Take 0.6 mg by mouth daily as needed (gout). 12/25/21  Yes [provider]  dabigatran (PRADAXA) 150 MG CAPS capsule TAKE 1 CAPSULE BY MOUTH TWICE DAILY( EVERY TWELVE HOURS) Patient taking differently: Take 150 mg by mouth 2 (two) times daily. 02/18/22  Yes Deboraha Sprang, MD  dronedarone (MULTAQ) 400 MG tablet Take 1 tablet (400 mg total) by mouth 2 (two) times daily. 06/18/22  Yes Deboraha Sprang, MD  furosemide (LASIX) 40 MG tablet Take 1 tablet (40 mg total) by mouth daily. 07/18/21  Yes Shirley Friar, PA-C  Insulin Glargine [facility] KWIKPEN) 100 UNIT/ML Inject 30 Units into the skin daily. 06/10/22  Yes [name], MD  JARDIANCE 25 MG TABS tablet Take 1 tablet (25 mg total) by mouth daily. 06/10/22  Yes [name], MD  metoprolol succinate (TOPROL XL) 25 MG 24 hr tablet Take 1 tablet (25 mg total) by mouth daily. 07/18/21  Yes Shirley Friar, PA-C  potassium chloride SA (KLOR-CON M15) 15 MEQ tablet Take 1 tablet (15  mEq total) by mouth 3 (three) times daily. 06/10/22  Yes [name], MD  sacubitril-valsartan (ENTRESTO) 24-26 MG Take 1 tablet by mouth 2 (two) times daily. 06/18/22  Yes Deboraha Sprang, MD  simvastatin (ZOCOR) 10 MG tablet Take 1 tablet (10 mg total) by mouth daily. 06/10/22  Yes [name], MD  Accu-Chek FastClix Lancets MISC  09/26/18   [provider]  Blood Pressure KIT 1 kit by Does not apply route daily. Check blood pressure once a day 03/11/21   Deboraha Sprang, MD  Continuous Blood Gluc Receiver ([place]) [place] 1 Act by Does not apply route daily. 06/10/22  [name], MD  Continuous Blood Gluc Sensor (DEXCOM G7 SENSOR) MISC 1 Act by Does not apply route daily. 06/10/22   [name], MD  Finerenone (KERENDIA) 10 MG TABS Take 1 tablet by mouth daily. Patient not taking: Reported on 06/19/2022 06/10/22   [name], MD  hydrOXYzine (ATARAX) 10 MG tablet Take 1 tablet (10 mg total) by mouth every 8 (eight) hours as needed. Patient not taking: Reported on 06/19/2022 06/10/22   [name], MD  Insulin Pen Needle (NOVOFINE) 30G X 8 MM MISC For Humalog meal time TID 08/22/18   Tysinger, Camelia Eng, PA-C  NOVOLOG FLEXPEN 100 UNIT/ML FlexPen Inject 5 Units into the skin 3 (three) times daily with meals. Patient not taking: Reported on 06/19/2022 11/16/20   Donne Hazel, MD    Physical Exam: Constitutional: Moderately built and nourished. Vitals:   06/19/22 0430 06/19/22 0500 06/19/22 0515 06/19/22 0545  BP: (!) 160/107 (!) 106/52 97/73 (!) 153/67  Pulse: 100 90 99 93  Resp:  _0 Temp:      TempSrc:      SpO2: 98% 97% 98% 97%  Weight:      Height:       Eyes: Anicteric no pallor. ENMT: No discharge from the ears eyes nose and mouth. Neck: No mass felt.  No neck rigidity. Respiratory: No rhonchi or crepitations. Cardiovascular: S1-S2 heard. Abdomen: Mild tenderness of the left lower quadrant.  No guarding or rigidity. Musculoskeletal: No  edema. Skin: No rash. Neurologic: Alert awake oriented to time place and person.  Moves all extremities. Psychiatric: Peers normal.  Normal affect.   Labs on Admission: I have personally reviewed following labs and imaging studies  CBC: Recent Labs  Lab 06/18/22 2136  WBC 7.0  NEUTROABS 4.2  HGB 11.7*  HCT 37.0  MCV 95.1  PLT 389   Basic Metabolic Panel: Recent Labs  Lab 06/18/22 2136  NA 140  K 3.4*  CL 107  CO2 26  GLUCOSE 233*  BUN 22  CREATININE 1.34*  CALCIUM 9.3   GFR: Estimated Creatinine Clearance: 49 mL/min (A) (by C-G formula based on SCr of 1.34 mg/dL (H)). Liver Function Tests: Recent Labs  Lab 06/18/22 2136  AST 13*  ALT 10  ALKPHOS 82  BILITOT 0.8  PROT 7.6  ALBUMIN 3.6   Recent Labs  Lab 06/18/22 2136  LIPASE 31   No results for input(s): "AMMONIA" in the last 168 hours. Coagulation Profile: No results for input(s): "INR", "PROTIME" in the last 168 hours. Cardiac Enzymes: No results for input(s): "CKTOTAL", "CKMB", "CKMBINDEX", "TROPONINI" in the last 168 hours. BNP (last 3 results) Recent Labs    07/18/21 1204  PROBNP 203   HbA1C: No results for input(s): "HGBA1C" in the last 72 hours. CBG: No results for input(s): "GLUCAP" in the last 168 hours. Lipid Profile: No results for input(s): "CHOL", "HDL", "LDLCALC", "TRIG", "CHOLHDL", "LDLDIRECT" in the last 72 hours. Thyroid Function Tests: No results for input(s): "TSH", "T4TOTAL", "FREET4", "T3FREE", "THYROIDAB" in the last 72 hours. Anemia Panel: No results for input(s): "VITAMINB12", "FOLATE", "FERRITIN", "TIBC", "IRON", "RETICCTPCT" in the last 72 hours. Urine analysis:    Component Value Date/Time   COLORURINE STRAW (A) 06/18/2022 2122   APPEARANCEUR CLEAR 06/18/2022 2122   LABSPEC 1.036 (H) 06/18/2022 2122   PHURINE 5.0 06/18/2022 2122   GLUCOSEU >=500 (A) 06/18/2022 2122   HGBUR NEGATIVE 06/18/2022 2122   HGBUR negative 09/02/2009 1428   BILIRUBINUR NEGATIVE  06/18/2022 2122   BILIRUBINUR negative 06/22/2018 1454   BILIRUBINUR n 08/02/2015 1125   KETONESUR 20 (A) 06/18/2022 2122   PROTEINUR NEGATIVE 06/18/2022 2122   UROBILINOGEN negative 08/02/2015 1125   UROBILINOGEN 0.2 08/16/2011 2150   NITRITE NEGATIVE 06/18/2022 2122   LEUKOCYTESUR NEGATIVE 06/18/2022 2122   Sepsis Labs: _0 (procalcitonin:4,lacticidven:4) )No results found for this or any previous visit (from the past 240 hour(s)).   Radiological Exams on Admission: CT ABDOMEN PELVIS W CONTRAST  Result Date: 06/19/2022 CLINICAL DATA:  Abdominal pain for 2 days EXAM: CT ABDOMEN AND PELVIS WITH CONTRAST TECHNIQUE: Multidetector CT imaging of the abdomen and pelvis was performed using the standard protocol following bolus administration of intravenous contrast. RADIATION DOSE REDUCTION: This exam was performed according to the departmental dose-optimization program which includes automated exposure control, adjustment of the mA and/or kV according to patient size and/or use of iterative reconstruction technique. CONTRAST:  61m OMNIPAQUE IOHEXOL 300 MG/ML  SOLN COMPARISON:  01/26/2022 FINDINGS: Lower chest: No acute abnormality. Hepatobiliary: No focal liver abnormality is seen. Status post cholecystectomy. No biliary dilatation. Pancreas: Unremarkable. No pancreatic ductal dilatation or surrounding inflammatory changes. Spleen: Normal in size without focal abnormality. Adrenals/Urinary Tract: Adrenal glands are within normal limits. Kidneys demonstrate a normal enhancement pattern. No renal calculi or obstructive changes are seen. Bladder is partially distended. Stomach/Bowel: Diverticular change is seen. Some surrounding inflammatory changes are noted at the junction of the rectum and sigmoid consistent with diverticulitis. Few small foci of air are noted which likely represent micro perforations. No abscess is seen. The stomach is within normal limits. Small bowel is unremarkable. The  appendix is within normal limits. Stomach is within normal limits. Vascular/Lymphatic: No significant vascular findings are present. No enlarged abdominal or pelvic lymph nodes. Reproductive: Status post hysterectomy. No adnexal masses. Other: No free fluid is noted. Musculoskeletal: Degenerative changes of lumbar spine are noted. No rib abnormality is seen. IMPRESSION: Diverticulitis similar to that noted on the prior exam. A few small extraluminal foci of air are noted consistent with micro perforations. No abscess is noted. Postsurgical changes. Electronically Signed   By: MInez CatalinaM.D.   On: 06/19/2022 03:07      Assessment/Plan Principal Problem:   Diverticulitis large intestine Active Problems:   Atrial fibrillation (HCC)   NICM (nonischemic cardiomyopathy) (HCC)   Stage 3a chronic kidney disease ([place]    Acute sigmoid diverticulitis with microperforation -patient has been started on Zosyn.  Will consult general surgery keep patient n.p.o.  Pain relief medications. A-fib rate heart rate is around 100-110/min in A-fib.  We will keep patient on scheduled dose of IV metoprolol.  Presently n.p.o. so cannot take Multaq.  We will keep patient on heparin for anticoagulation.  Last dose of Pradaxa was more than 48 hours ago. History of nonischemic cardiomyopathy holding EFerne Coefor now.  Gently hydrating.  Closely monitor respiratory status. Diabetes mellitus type 2 last hemoglobin A1c was 8.9 a week ago.  Was started on insulin.  We will keep patient on insulin glargine 10 units along with sliding scale coverage.  Closely follow CBGs. Chronic kidney disease stage III creatinine appears to be at baseline. Anemia -follow CBC.   Since patient has acute sigmoid diverticulitis with microperforation will need close observation and further management and inpatient status.  DVT prophylaxis: Heparin. Code Status: Full code. Family Communication: Discussed with patient. Disposition  Plan: Home. Consults called: We will consult general surgery. Admission status: Inpatient.   ARise PatienceMD [facility] Pager [phone].  If 7PM-7AM, please contact night-coverage [email or web address] Password [email or web address]  06/19/2022, 6:02 AM

## 2022-06-20 DIAGNOSIS — I428 Other cardiomyopathies: Secondary | ICD-10-CM

## 2022-06-20 DIAGNOSIS — K5720 Diverticulitis of large intestine with perforation and abscess without bleeding: Secondary | ICD-10-CM | POA: Diagnosis not present

## 2022-06-20 DIAGNOSIS — K5792 Diverticulitis of intestine, part unspecified, without perforation or abscess without bleeding: Secondary | ICD-10-CM | POA: Diagnosis not present

## 2022-06-20 DIAGNOSIS — I4819 Other persistent atrial fibrillation: Secondary | ICD-10-CM | POA: Diagnosis not present

## 2022-06-20 LAB — COMPREHENSIVE METABOLIC PANEL
ALT: 8 U/L (ref 0–44)
AST: 11 U/L — ABNORMAL LOW (ref 15–41)
Albumin: 2.8 g/dL — ABNORMAL LOW (ref 3.5–5.0)
Alkaline Phosphatase: 61 U/L (ref 38–126)
Anion gap: 5 (ref 5–15)
BUN: 15 mg/dL (ref 8–23)
CO2: 26 mmol/L (ref 22–32)
Calcium: 8.7 mg/dL — ABNORMAL LOW (ref 8.9–10.3)
Chloride: 113 mmol/L — ABNORMAL HIGH (ref 98–111)
Creatinine, Ser: 0.95 mg/dL (ref 0.44–1.00)
GFR, Estimated: >60 mL/min (ref >60)
Glucose, Bld: 90 mg/dL (ref 70–99)
Potassium: 3.4 mmol/L — ABNORMAL LOW (ref 3.5–5.1)
Sodium: 144 mmol/L (ref 135–145)
Total Bilirubin: 0.7 mg/dL (ref 0.3–1.2)
Total Protein: 6 g/dL — ABNORMAL LOW (ref 6.5–8.1)

## 2022-06-20 LAB — BASIC METABOLIC PANEL
Anion gap: 2 — ABNORMAL LOW (ref 5–15)
BUN: 13 mg/dL (ref 8–23)
CO2: 27 mmol/L (ref 22–32)
Calcium: 8.6 mg/dL — ABNORMAL LOW (ref 8.9–10.3)
Chloride: 111 mmol/L (ref 98–111)
Creatinine, Ser: 0.97 mg/dL (ref 0.44–1.00)
GFR, Estimated: >60 mL/min (ref >60)
Glucose, Bld: 167 mg/dL — ABNORMAL HIGH (ref 70–99)
Potassium: 3.7 mmol/L (ref 3.5–5.1)
Sodium: 140 mmol/L (ref 135–145)

## 2022-06-20 LAB — GLUCOSE, CAPILLARY
Glucose-Capillary: 107 mg/dL — ABNORMAL HIGH (ref 70–99)
Glucose-Capillary: 121 mg/dL — ABNORMAL HIGH (ref 70–99)
Glucose-Capillary: 132 mg/dL — ABNORMAL HIGH (ref 70–99)
Glucose-Capillary: 151 mg/dL — ABNORMAL HIGH (ref 70–99)
Glucose-Capillary: 157 mg/dL — ABNORMAL HIGH (ref 70–99)
Glucose-Capillary: 81 mg/dL (ref 70–99)
Glucose-Capillary: 92 mg/dL (ref 70–99)

## 2022-06-20 LAB — MAGNESIUM: Magnesium: 2.3 mg/dL (ref 1.7–2.4)

## 2022-06-20 LAB — HEPARIN LEVEL (UNFRACTIONATED): Heparin Unfractionated: 0.5 IU/mL (ref 0.30–0.70)

## 2022-06-20 MED ORDER — ONDANSETRON HCL 4 MG/2ML IJ SOLN
4.0000 mg | Freq: Four times a day (QID) | INTRAMUSCULAR | Status: DC | PRN
  Administered 2022-06-20: 4 mg via INTRAVENOUS
  Filled 2022-06-20: qty 2

## 2022-06-20 MED ORDER — INSULIN ASPART 100 UNIT/ML IJ SOLN
0.0000 [IU] | Freq: Three times a day (TID) | INTRAMUSCULAR | Status: DC
  Administered 2022-06-20: 1 [IU] via SUBCUTANEOUS
  Administered 2022-06-21: 2 [IU] via SUBCUTANEOUS

## 2022-06-20 MED ORDER — METOPROLOL SUCCINATE ER 25 MG PO TB24
25.0000 mg | ORAL_TABLET | Freq: Every day | ORAL | Status: DC
  Administered 2022-06-20 – 2022-06-21 (×2): 25 mg via ORAL
  Filled 2022-06-20 (×2): qty 1

## 2022-06-20 MED ORDER — POTASSIUM CHLORIDE 10 MEQ/100ML IV SOLN
10.0000 meq | INTRAVENOUS | Status: AC
  Administered 2022-06-20: 10 meq via INTRAVENOUS
  Filled 2022-06-20: qty 100

## 2022-06-20 MED ORDER — POTASSIUM CHLORIDE CRYS ER 20 MEQ PO TBCR
40.0000 meq | EXTENDED_RELEASE_TABLET | ORAL | Status: AC
  Administered 2022-06-20 (×2): 40 meq via ORAL
  Filled 2022-06-20 (×2): qty 2

## 2022-06-20 MED ORDER — ALPRAZOLAM 0.5 MG PO TABS
0.5000 mg | ORAL_TABLET | Freq: Every evening | ORAL | Status: DC | PRN
Start: 2022-06-20 — End: 2022-06-21
  Administered 2022-06-20: 0.5 mg via ORAL
  Filled 2022-06-20: qty 1

## 2022-06-20 MED ORDER — DRONEDARONE HCL 400 MG PO TABS
400.0000 mg | ORAL_TABLET | Freq: Two times a day (BID) | ORAL | Status: DC
  Administered 2022-06-20 – 2022-06-21 (×2): 400 mg via ORAL
  Filled 2022-06-20 (×3): qty 1

## 2022-06-20 MED ORDER — SACUBITRIL-VALSARTAN 24-26 MG PO TABS
1.0000 | ORAL_TABLET | Freq: Two times a day (BID) | ORAL | Status: DC
  Administered 2022-06-20 – 2022-06-21 (×3): 1 via ORAL
  Filled 2022-06-20 (×4): qty 1

## 2022-06-20 MED ORDER — ALLOPURINOL 100 MG PO TABS: 50.0000 mg | ORAL_TABLET | Freq: Two times a day (BID) | ORAL | Status: DC | PRN

## 2022-06-20 NOTE — Progress Notes (Signed)
[facility] Progress Note  Patient: Stephanie [name]     XBD:[id number]  DOA: 06/18/2022   PCP: [name], MD       Brief hospital course:   This is a 71 year old female with atrial fibrillation, nonischemic cardiomyopathy and diabetes mellitus who presents to the hospital for abdominal pain.  In the ED she had a left lower quadrant and suprapubic tenderness.  CT of the abdomen and pelvis showed sigmoid diverticulitis with microperforation.  She was started on IV antibiotics.  General surgery consult was requested.  Subjective:  She continues to have some lower abdominal pain today.  She is complaining that she has not had anything to eat in 4 days.  She has no other complaints. Assessment and Plan: Principal Problem:   Diverticulitis large intestine (sigmoid) with microperforation - Appreciate general surgery consult - She has been started on clear liquids today - Continue Zosyn  Active Problems:   Atrial fibrillation, persistent ([place])  - Follows with EP as outpatient - We will resume Multaq and metoprolol - On heparin drip in place of Pradaxa    NICM (nonischemic cardiomyopathy) (HCC) Chronic systolic heart failure - EF 40 to 45% when last checked on 11/14/2020 - Lasix and Entresto have been on hold - We will resume Entresto today-she has no IV fluids running  Hypokalemia - Replace and recheck tomorrow -Mag seems normal  Diabetes mellitus - Home medications include Jardiance, glargine and NovoLog with meals - Continue glargine 10 units daily and follow  Morbid obesity Body mass index is 45.85 kg/m.  Normocytic anemia   Of note CKD stage III listed in patient's chart however, creatinine is 0.95 today and GFR greater than 60     DVT prophylaxis:    Heparin infusion     Code Status: Full Code  Consultants: General surgery Level of Care: Level of care: Telemetry Disposition Plan:  Status is: Inpatient Remains inpatient appropriate because:  Acute  Objective:   Vitals:   06/19/22 2039 06/20/22 0433 06/20/22 0452 06/20/22 1318  BP: 138/84 (!) 154/79  138/81  Pulse: 77 73  95  Resp: '18 18  17  '$ Temp: 97.8 F (36.6 C) 97.6 F (36.4 C)  98.2 F (36.8 C)  TempSrc: Oral Oral  Oral  SpO2: 96% 97%  99%  Weight:   117.4 kg   Height:       Filed Weights   06/18/22 2119 06/20/22 0452  Weight: 120.2 kg 117.4 kg   Exam: General exam: Appears comfortable  HEENT:  oral mucosa moist, no sclera icterus or thrush Respiratory system: Clear to auscultation. Respiratory effort normal. Cardiovascular system: S1 & S2 heard, irregularly irregular rate and rhythm Gastrointestinal system: Abdomen soft, tender in suprapubic area, nondistended. Normal bowel sounds   Central nervous system: Alert and oriented. No focal neurological deficits. Extremities: No cyanosis, clubbing or edema Skin: No rashes or ulcers Psychiatry:  Mood & affect appropriate.    Imaging and lab data was personally reviewed    CBC: Recent Labs  Lab 06/18/22 2136 06/19/22 2043  WBC 7.0 4.7  NEUTROABS 4.2  --   HGB 11.7* 9.9*  HCT 37.0 32.2*  MCV 95.1 97.0  PLT 235 834   Basic Metabolic Panel: Recent Labs  Lab 06/18/22 2136 06/20/22 0601  NA 140 144  K 3.4* 3.4*  CL 107 113*  CO2 26 26  GLUCOSE 233* 90  BUN 22 15  CREATININE 1.34* 0.95  CALCIUM 9.3 8.7*  MG  --  2.3  GFR: Estimated Creatinine Clearance: 68.2 mL/min (by C-G formula based on SCr of 0.95 mg/dL).  Scheduled Meds:  insulin aspart  0-6 Units Subcutaneous Q4H   insulin glargine-yfgn  10 Units Subcutaneous Daily   metoprolol tartrate  5 mg Intravenous Q6H   Continuous Infusions:  heparin 1,250 Units/hr (06/20/22 0306)   piperacillin-tazobactam (ZOSYN)  IV 3.375 g (06/20/22 1216)     LOS: 1 day   Author: Debbe Odea  06/20/2022 1:22 PM

## 2022-06-20 NOTE — Progress Notes (Signed)
Subjective/Chief Complaint: Feels better. Still has some LLQ pain but improved   Objective: Vital signs in last 24 hours: Temp:  [97.6 F (36.4 C)-98.1 F (36.7 C)] 97.6 F (36.4 C) (09/02 0433) Pulse Rate:  [72-83] 73 (09/02 0433) Resp:  [12-18] 18 (09/02 0433) BP: (129-158)/(77-102) 154/79 (09/02 0433) SpO2:  [95 %-98 %] 97 % (09/02 0433) Weight:  [117.4 kg] 117.4 kg (09/02 0452) Last BM Date : 06/19/22  Intake/Output from previous day: 09/01 0701 - 09/02 0700 In: 1132.6 [I.V.:1032.7; IV Piggyback:100] Out: 250 [Urine:250] Intake/Output this shift: No intake/output data recorded.  General appearance: alert and cooperative Resp: clear to auscultation bilaterally Cardio: regular rate and rhythm GI: soft, mild LLQ tenderness  Lab Results:  Recent Labs    06/18/22 2136 06/19/22 2043  WBC 7.0 4.7  HGB 11.7* 9.9*  HCT 37.0 32.2*  PLT 235 183   BMET Recent Labs    06/18/22 2136 06/20/22 0601  NA 140 144  K 3.4* 3.4*  CL 107 113*  CO2 26 26  GLUCOSE 233* 90  BUN 22 15  CREATININE 1.34* 0.95  CALCIUM 9.3 8.7*   PT/INR No results for input(s): "LABPROT", "INR" in the last 72 hours. ABG No results for input(s): "PHART", "HCO3" in the last 72 hours.  Invalid input(s): "PCO2", "PO2"  Studies/Results: CT ABDOMEN PELVIS W CONTRAST  Result Date: 06/19/2022 CLINICAL DATA:  Abdominal pain for 2 days EXAM: CT ABDOMEN AND PELVIS WITH CONTRAST TECHNIQUE: Multidetector CT imaging of the abdomen and pelvis was performed using the standard protocol following bolus administration of intravenous contrast. RADIATION DOSE REDUCTION: This exam was performed according to the departmental dose-optimization program which includes automated exposure control, adjustment of the mA and/or kV according to patient size and/or use of iterative reconstruction technique. CONTRAST:  40m OMNIPAQUE IOHEXOL 300 MG/ML  SOLN COMPARISON:  01/26/2022 FINDINGS: Lower chest: No acute abnormality.  Hepatobiliary: No focal liver abnormality is seen. Status post cholecystectomy. No biliary dilatation. Pancreas: Unremarkable. No pancreatic ductal dilatation or surrounding inflammatory changes. Spleen: Normal in size without focal abnormality. Adrenals/Urinary Tract: Adrenal glands are within normal limits. Kidneys demonstrate a normal enhancement pattern. No renal calculi or obstructive changes are seen. Bladder is partially distended. Stomach/Bowel: Diverticular change is seen. Some surrounding inflammatory changes are noted at the junction of the rectum and sigmoid consistent with diverticulitis. Few small foci of air are noted which likely represent micro perforations. No abscess is seen. The stomach is within normal limits. Small bowel is unremarkable. The appendix is within normal limits. Stomach is within normal limits. Vascular/Lymphatic: No significant vascular findings are present. No enlarged abdominal or pelvic lymph nodes. Reproductive: Status post hysterectomy. No adnexal masses. Other: No free fluid is noted. Musculoskeletal: Degenerative changes of lumbar spine are noted. No rib abnormality is seen. IMPRESSION: Diverticulitis similar to that noted on the prior exam. A few small extraluminal foci of air are noted consistent with micro perforations. No abscess is noted. Postsurgical changes. Electronically Signed   By: MInez CatalinaM.D.   On: 06/19/2022 03:07    Anti-infectives: Anti-infectives (From admission, onward)    Start     Dose/Rate Route Frequency Ordered Stop   06/19/22 1200  piperacillin-tazobactam (ZOSYN) IVPB 3.375 g        3.375 g 12.5 mL/hr over 240 Minutes Intravenous Every 8 hours 06/19/22 0628     06/19/22 0415  piperacillin-tazobactam (ZOSYN) IVPB 3.375 g        3.375 g 12.5 mL/hr over 240  Minutes Intravenous  Once 06/19/22 0402 06/19/22 0820       Assessment/Plan: s/p * No surgery found * Advance diet. Start clears today Continue IV abx Ambulate OK to restart  home xanax Diverticulitis with microperf  LOS: 1 day    Stephanie [name] 06/20/2022

## 2022-06-20 NOTE — Progress Notes (Signed)
ANTICOAGULATION CONSULT NOTE - Follow Up Consult  Pharmacy Consult for Heparin Indication: atrial fibrillation  Allergies  Allergen Reactions   Amiodarone Other (See Comments)    adverse reaction: Tremor/gait disurbance   Aspirin Other (See Comments)    "Burns my stomach"   Methimazole Nausea And Vomiting   Propoxyphene Nausea And Vomiting   Trintellix [Vortioxetine] Nausea And Vomiting   Albuterol Palpitations   Atorvastatin Other (See Comments)    Body aches and pains--stiffness.    Livalo [Pitavastatin]     Body aches    Patient Measurements: Height: '5\' 3"'$  (160 cm) Weight: 117.4 kg (258 lb 13.1 oz) IBW/kg (Calculated) : 52.4 Heparin Dosing Weight:  81.9 kg  Vital Signs: Temp: 97.6 F (36.4 C) (09/02 0433) Temp Source: Oral (09/02 0433) BP: 154/79 (09/02 0433) Pulse Rate: 73 (09/02 0433)  Labs: Recent Labs    06/18/22 2136 06/19/22 1546 06/19/22 2043 06/20/22 0601  HGB 11.7*  --  9.9*  --   HCT 37.0  --  32.2*  --   PLT 235  --  183  --   HEPARINUNFRC  --  0.49 0.45 0.50  CREATININE 1.34*  --   --  0.95    Estimated Creatinine Clearance: 68.2 mL/min (by C-G formula based on SCr of 0.95 mg/dL).   Assessment:  AC/Heme: Pradaxa PTA for afib on hold. Bridge with IV heparin - Hgb 11.7>9.9, Plts 183, Hep level 0.5 in goal range.  Goal of Therapy:  Heparin level 0.3-0.7 units/ml Monitor platelets by anticoagulation protocol: Yes   Plan:  Con't IV heparin at 1250 units/hr Daily HL and CBC  Rand Boller S. Alford Highland, PharmD, BCPS Clinical Staff Pharmacist [email or web address]  Alford Highland, [place] Doh[place] Stillinger 06/20/2022,12:54 PM

## 2022-06-21 DIAGNOSIS — I4819 Other persistent atrial fibrillation: Secondary | ICD-10-CM | POA: Diagnosis not present

## 2022-06-21 DIAGNOSIS — K631 Perforation of intestine (nontraumatic): Secondary | ICD-10-CM

## 2022-06-21 DIAGNOSIS — K5792 Diverticulitis of intestine, part unspecified, without perforation or abscess without bleeding: Secondary | ICD-10-CM | POA: Diagnosis not present

## 2022-06-21 DIAGNOSIS — K5720 Diverticulitis of large intestine with perforation and abscess without bleeding: Secondary | ICD-10-CM | POA: Diagnosis not present

## 2022-06-21 LAB — CBC
HCT: 32 % — ABNORMAL LOW (ref 36.0–46.0)
Hemoglobin: 10 g/dL — ABNORMAL LOW (ref 12.0–15.0)
MCH: 30.1 pg (ref 26.0–34.0)
MCHC: 31.3 g/dL (ref 30.0–36.0)
MCV: 96.4 fL (ref 80.0–100.0)
Platelets: 212 10*3/uL (ref 150–400)
RBC: 3.32 MIL/uL — ABNORMAL LOW (ref 3.87–5.11)
RDW: 13.3 % (ref 11.5–15.5)
WBC: 4.5 10*3/uL (ref 4.0–10.5)
nRBC: 0 % (ref 0.0–0.2)

## 2022-06-21 LAB — GLUCOSE, CAPILLARY
Glucose-Capillary: 108 mg/dL — ABNORMAL HIGH (ref 70–99)
Glucose-Capillary: 94 mg/dL (ref 70–99)
Glucose-Capillary: 244 mg/dL — ABNORMAL HIGH (ref 70–99)

## 2022-06-21 LAB — HEPARIN LEVEL (UNFRACTIONATED): Heparin Unfractionated: 0.51 IU/mL (ref 0.30–0.70)

## 2022-06-21 MED ORDER — AMOXICILLIN-POT CLAVULANATE 875-125 MG PO TABS
1.0000 | ORAL_TABLET | Freq: Two times a day (BID) | ORAL | 0 refills | Status: AC
Start: 2022-06-21 — End: 2022-07-02

## 2022-06-21 MED ORDER — POTASSIUM CHLORIDE CRYS ER 10 MEQ PO TBCR: 15.0000 meq | EXTENDED_RELEASE_TABLET | Freq: Three times a day (TID) | ORAL | Status: DC

## 2022-06-21 MED ORDER — FUROSEMIDE 40 MG PO TABS
40.0000 mg | ORAL_TABLET | Freq: Every day | ORAL | Status: DC
  Administered 2022-06-21: 40 mg via ORAL
  Filled 2022-06-21: qty 1

## 2022-06-21 MED ORDER — AMOXICILLIN-POT CLAVULANATE 875-125 MG PO TABS
1.0000 | ORAL_TABLET | Freq: Two times a day (BID) | ORAL | Status: DC
  Administered 2022-06-21: 1 via ORAL
  Filled 2022-06-21: qty 1

## 2022-06-21 MED ORDER — DABIGATRAN ETEXILATE MESYLATE 150 MG PO CAPS
150.0000 mg | ORAL_CAPSULE | Freq: Two times a day (BID) | ORAL | Status: DC
  Administered 2022-06-21: 150 mg via ORAL
  Filled 2022-06-21 (×2): qty 1

## 2022-06-21 MED ORDER — POTASSIUM CHLORIDE CRYS ER 20 MEQ PO TBCR
20.0000 meq | EXTENDED_RELEASE_TABLET | Freq: Two times a day (BID) | ORAL | Status: DC
  Administered 2022-06-21: 20 meq via ORAL
  Filled 2022-06-21: qty 1

## 2022-06-21 NOTE — Progress Notes (Signed)
Provided patient with AVS discharge instructions, patient had no further questions upon discharge. 

## 2022-06-21 NOTE — Discharge Summary (Addendum)
Physician Discharge Summary  Stephanie [name] NWG:[id number] DOB: 10-18-1951 DOA: 06/18/2022  PCP: [name], MD  Admit date: 06/18/2022 Discharge date: 06/21/2022 Discharging to: Home Recommendations for Outpatient Follow-up:  General surgery recommends follow-up with a colorectal surgeon  Consults:  General surgery   Discharge Diagnoses:   Principal Problem:   Diverticulitis large intestine with microperforation Active Problems:   Atrial fibrillation, persistent ([place])   Insulin-requiring or dependent type II diabetes mellitus ([name])   NICM (nonischemic cardiomyopathy) [facility])     Hospital Course:  This is a 71 year old female with atrial fibrillation, nonischemic cardiomyopathy and diabetes mellitus who presents to the hospital for abdominal pain.  In the ED she had a left lower quadrant and suprapubic tenderness.  CT of the abdomen and pelvis showed sigmoid diverticulitis with microperforation.  She was started on IV antibiotics.  General surgery consult was requested.  Principal Problem:   Diverticulitis large intestine (sigmoid) with microperforation - Appreciate general surgery consult - She has been advanced to a regular diet today - Zosyn transition to Augmentin-we will continue Augmentin for total of 14-day course   Active Problems:   Atrial fibrillation, persistent (HCC)  - Follows with EP as outpatient -  resumed Multaq and metoprolol  - cont Pradaxa     NICM (nonischemic cardiomyopathy) ([facility]) - Chronic systolic heart failure - EF 40 to 45% when last checked on 11/14/2020    Hypokalemia - Replaced    Diabetes mellitus - Home medications include Jardiance, glargine and NovoLog    Morbid obesity Body mass index is 45.85 kg/m.   Normocytic anemia     Of note CKD stage III listed in patient's chart however, creatinine is 0.95 yesterday and GFR greater than 60           Discharge Instructions  Discharge Instructions     Increase activity slowly    Complete by: As directed       Allergies as of 06/21/2022       Reactions   Amiodarone Other (See Comments)   adverse reaction: Tremor/gait disurbance   Aspirin Other (See Comments)   "Burns my stomach"   Methimazole Nausea And Vomiting   Propoxyphene Nausea And Vomiting   Trintellix [vortioxetine] Nausea And Vomiting   Albuterol Palpitations   Atorvastatin Other (See Comments)   Body aches and pains--stiffness.    Livalo [pitavastatin]    Body aches        Medication List     TAKE these medications    Accu-Chek FastClix Lancets Misc   allopurinol 100 MG tablet Commonly known as: ZYLOPRIM TAKE 1/2 TABLET(50 MG) BY MOUTH TWICE DAILY What changed: See the new instructions.   ALPRAZolam 0.5 MG tablet Commonly known as: XANAX Take 1 tablet (0.5 mg total) by mouth 3 (three) times daily as needed for anxiety. What changed:  when to take this reasons to take this   amoxicillin-clavulanate 875-125 MG tablet Commonly known as: AUGMENTIN Take 1 tablet by mouth every 12 (twelve) hours for 11 days.   Basaglar KwikPen 100 UNIT/ML Inject 30 Units into the skin daily.   Blood Pressure Kit 1 kit by Does not apply route daily. Check blood pressure once a day   colchicine 0.6 MG tablet Take 0.6 mg by mouth daily as needed (gout).   dabigatran 150 MG Caps capsule Commonly known as: Pradaxa TAKE 1 CAPSULE BY MOUTH TWICE DAILY( EVERY TWELVE HOURS) What changed:  how much to take how to take this when to take this additional  instructions   Dexcom G7 Receiver Devi 1 Act by Does not apply route daily.   Dexcom G7 Sensor Misc 1 Act by Does not apply route daily.   Entresto 24-26 MG Generic drug: sacubitril-valsartan Take 1 tablet by mouth 2 (two) times daily.   furosemide 40 MG tablet Commonly known as: LASIX Take 1 tablet (40 mg total) by mouth daily.   hydrOXYzine 10 MG tablet Commonly known as: ATARAX Take 1 tablet (10 mg total) by mouth every 8 (eight) hours  as needed.   Insulin Pen Needle 30G X 8 MM Misc Commonly known as: NOVOFINE For Humalog meal time TID   Jardiance 25 MG Tabs tablet Generic drug: empagliflozin Take 1 tablet (25 mg total) by mouth daily.   Kerendia 10 MG Tabs Generic drug: Finerenone Take 1 tablet by mouth daily.   metoprolol succinate 25 MG 24 hr tablet Commonly known as: Toprol XL Take 1 tablet (25 mg total) by mouth daily.   Multaq 400 MG tablet Generic drug: dronedarone Take 1 tablet (400 mg total) by mouth 2 (two) times daily.   NovoLOG FlexPen 100 UNIT/ML FlexPen Generic drug: insulin aspart Inject 5 Units into the skin 3 (three) times daily with meals.   potassium chloride SA 15 MEQ tablet Commonly known as: Klor-Con M15 Take 1 tablet (15 mEq total) by mouth 3 (three) times daily.   simvastatin 10 MG tablet Commonly known as: ZOCOR Take 1 tablet (10 mg total) by mouth daily.        [facility]-up [facility] Follow up in 2 week(s).   Specialty: General Surgery Why: first available colorectal surgeon Contact information: [address] [place] [phone]                   The results of significant diagnostics from this hospitalization (including imaging, microbiology, ancillary and laboratory) are listed below for reference.    CT ABDOMEN PELVIS W CONTRAST  Result Date: 06/19/2022 CLINICAL DATA:  Abdominal pain for 2 days EXAM: CT ABDOMEN AND PELVIS WITH CONTRAST TECHNIQUE: Multidetector CT imaging of the abdomen and pelvis was performed using the standard protocol following bolus administration of intravenous contrast. RADIATION DOSE REDUCTION: This exam was performed according to the departmental dose-optimization program which includes automated exposure control, adjustment of the mA and/or kV according to patient size and/or use of iterative reconstruction technique. CONTRAST:  48m OMNIPAQUE IOHEXOL 300  MG/ML  SOLN COMPARISON:  01/26/2022 FINDINGS: Lower chest: No acute abnormality. Hepatobiliary: No focal liver abnormality is seen. Status post cholecystectomy. No biliary dilatation. Pancreas: Unremarkable. No pancreatic ductal dilatation or surrounding inflammatory changes. Spleen: Normal in size without focal abnormality. Adrenals/Urinary Tract: Adrenal glands are within normal limits. Kidneys demonstrate a normal enhancement pattern. No renal calculi or obstructive changes are seen. Bladder is partially distended. Stomach/Bowel: Diverticular change is seen. Some surrounding inflammatory changes are noted at the junction of the rectum and sigmoid consistent with diverticulitis. Few small foci of air are noted which likely represent micro perforations. No abscess is seen. The stomach is within normal limits. Small bowel is unremarkable. The appendix is within normal limits. Stomach is within normal limits. Vascular/Lymphatic: No significant vascular findings are present. No enlarged abdominal or pelvic lymph nodes. Reproductive: Status post hysterectomy. No adnexal masses. Other: No free fluid is noted. Musculoskeletal: Degenerative changes of lumbar spine are noted. No rib abnormality is seen. IMPRESSION: Diverticulitis similar to that noted on the  prior exam. A few small extraluminal foci of air are noted consistent with micro perforations. No abscess is noted. Postsurgical changes. Electronically Signed   By: Inez Catalina M.D.   On: 06/19/2022 03:07   Labs:   Basic Metabolic Panel: Recent Labs  Lab 06/18/22 2136 06/20/22 0601 06/20/22 1346  NA 140 144 140  K 3.4* 3.4* 3.7  CL 107 113* 111  CO2 _0 GLUCOSE 233* 90 167*  BUN _1 CREATININE 1.34* 0.95 0.97  CALCIUM 9.3 8.7* 8.6*  MG  --  2.3  --      CBC: Recent Labs  Lab 06/18/22 2136 06/19/22 2043 06/21/22 0625  WBC 7.0 4.7 4.5  NEUTROABS 4.2  --   --   HGB 11.7* 9.9* 10.0*  HCT 37.0 32.2* 32.0*  MCV 95.1 97.0 96.4  PLT  235 183 212         SIGNED:   Debbe Odea, MD  [facility] 06/21/2022, 2:26 PM

## 2022-06-21 NOTE — Progress Notes (Signed)
   Subjective/Chief Complaint: Feels better. Wants to go home today   Objective: Vital signs in last 24 hours: Temp:  [97.7 F (36.5 C)-98.2 F (36.8 C)] 97.7 F (36.5 C) (09/03 0529) Pulse Rate:  [70-95] 88 (09/03 0529) Resp:  [17-18] 17 (09/03 0529) BP: (138-154)/(81-96) 142/95 (09/03 0529) SpO2:  [90 %-99 %] 95 % (09/03 0529) Weight:  [096 kg] 117 kg (09/03 0500) Last BM Date : 06/19/22  Intake/Output from previous day: 09/02 0701 - 09/03 0700 In: 630.7 [P.O.:120; I.V.:323.6; IV Piggyback:187.1] Out: 950 [Urine:950] Intake/Output this shift: No intake/output data recorded.  General appearance: alert and cooperative Resp: clear to auscultation bilaterally Cardio: regular rate and rhythm GI: soft, minimal tenderness  Lab Results:  Recent Labs    06/19/22 2043 06/21/22 0625  WBC 4.7 4.5  HGB 9.9* 10.0*  HCT 32.2* 32.0*  PLT 183 212   BMET Recent Labs    06/20/22 0601 06/20/22 1346  NA 144 140  K 3.4* 3.7  CL 113* 111  CO2 26 27  GLUCOSE 90 167*  BUN 15 13  CREATININE 0.95 0.97  CALCIUM 8.7* 8.6*   PT/INR No results for input(s): "LABPROT", "INR" in the last 72 hours. ABG No results for input(s): "PHART", "HCO3" in the last 72 hours.  Invalid input(s): "PCO2", "PO2"  Studies/Results: No results found.  Anti-infectives: Anti-infectives (From admission, onward)    Start     Dose/Rate Route Frequency Ordered Stop   06/21/22 1000  amoxicillin-clavulanate (AUGMENTIN) 875-125 MG per tablet 1 tablet        1 tablet Oral Every 12 hours 06/21/22 0737     06/19/22 1200  piperacillin-tazobactam (ZOSYN) IVPB 3.375 g  Status:  Discontinued        3.375 g 12.5 mL/hr over 240 Minutes Intravenous Every 8 hours 06/19/22 0628 06/21/22 0738   06/19/22 0415  piperacillin-tazobactam (ZOSYN) IVPB 3.375 g        3.375 g 12.5 mL/hr over 240 Minutes Intravenous  Once 06/19/22 0402 06/19/22 0820       Assessment/Plan: s/p * No surgery found * Advance  diet Switch to oral abx If she tolerates then it may be possible to get her home later today if ok with medical service She will need close follow up with our colorectal surgeons  LOS: 2 days    Stephanie [name] 06/21/2022

## 2022-06-21 NOTE — Progress Notes (Signed)
  Transition of Care [facility]) Screening Note   Patient Details  Name: Navina Wohlers Date of Birth: May 18, 1951   Transition [facility] Care [facility]) CM/SW Contact:    Dessa Phi, RN Phone Number: 06/21/2022, 3:54 PM    Transition of Care Department [facility]) has reviewed patient and no TOC needs have been identified at this time. We will continue to monitor patient advancement through interdisciplinary progression rounds. If new patient transition needs arise, please place a TOC consult.

## 2022-06-24 ENCOUNTER — Telehealth: Payer: Self-pay

## 2022-06-24 NOTE — Telephone Encounter (Signed)
Transition Care Management Unsuccessful Follow-up Telephone Call  Date of discharge and from where:  06/21/2022 [facility]  Attempts:  1st Attempt  Reason for unsuccessful TCM follow-up call:  Left voice message

## 2022-06-25 NOTE — Telephone Encounter (Signed)
Transition Care Management Follow-up Telephone Call Date of discharge and from where: 06/21/2022 [facility] How have you been since you were released from the hospital? Pt stated that she is ok. However, is experiencing some gout in her right foot/ankle. Appetite is better than while she was in the hospital.  Any questions or concerns? Yes, pt has medication concerns explaining that she has a lot of meds to keep up with.   Items Reviewed: Did the pt receive and understand the discharge instructions provided? Yes  Medications obtained and verified? Yes  Other?  N/A Any new allergies since your discharge?  No Dietary orders reviewed? No Do you have support at home? Yes   Home Care and Equipment/Supplies: Were home health services ordered? no If so, what is the name of the agency? n/a  Has the agency set up a time to come to the patient's home? not applicable Were any new equipment or medical supplies ordered?  No What is the name of the medical supply agency? n/a Were you able to get the supplies/equipment? not applicable Do you have any questions related to the use of the equipment or supplies? No  Functional Questionnaire: (I = Independent and D = Dependent) ADLs: I  Bathing/Dressing- I  Meal Prep- I  Eating- I  Maintaining continence- I  Transferring/Ambulation- I but having some mobility issues due to the gout flare up  Managing Meds- I  Follow up appointments reviewed:  PCP Hospital f/u appt confirmed? Yes  Scheduled to see PCP on 9/14 @ 2.20pm. [facility] f/u appt confirmed? Yes  Scheduled to see surgeon. (Pt unsure of date and time) Are transportation arrangements needed? No  If their condition worsens, is the pt aware to call PCP or go to the Emergency Dept.? Yes Was the patient provided with contact information for the PCP's office or ED? Yes Was to pt encouraged to call back with questions or concerns? Yes

## 2022-07-01 ENCOUNTER — Ambulatory Visit: Payer: Self-pay | Admitting: Licensed Clinical Social Worker

## 2022-07-01 NOTE — Patient Instructions (Signed)
Visit Information  Thank you for taking time to visit with me today. Please don't hesitate to contact me if I can be of assistance to you.   Following are the goals we discussed today:   Goals Addressed             This Visit's Progress    Connect for therapy       Care Coordination Interventions: Solution-Focused Strategies employed:  Active listening / Reflection utilized  Problem [place] strategies reviewed Has initial appointment  rescheduled with TTC for therapy today 07/01/22 Missed initial appointment 06/30/22 unable to locate office Assessed for barriers to appointment/ none identified        Our next appointment is by telephone on 08/04/22 at 9:30  Please call the care guide team at [phone] if you need to cancel or reschedule your appointment.   If you are experiencing a Mental Health or [place] or need someone to talk to, please call the Suicide and Crisis Lifeline: 988 call the [place] National Suicide Prevention Lifeline: (315)002-7613 or TTY: 916-310-3565 TTY 703 661 0920) to talk to a trained counselor call 1-800-273-TALK (toll free, 24 hour hotline) go to [facility] [address], [place] [phone])   Patient verbalizes understanding of instructions and care plan provided today and agrees to view in [place]. Active MyChart status and patient understanding of how to access instructions and care plan via MyChart confirmed with patient.     Casimer Lanius, [place] [phone]

## 2022-07-01 NOTE — Patient Outreach (Signed)
  Care Coordination  Follow Up Visit Note   07/01/2022 Name: Stephanie [name] MRN: [id number] DOB: [birth year]  Stephanie [name] is a 71 y.o. year old female who sees [name], MD for primary care. I spoke with  [name] by phone today.  What matters to the patients health and wellness today?  Getting connected for therapy Patient is making progress with connecting for therapy. She experienced some barriers and has resolved them  Recently discharged from the hospital reports no concerns, has f/u appointment with PCP 07/02/22 .   Recommendation: Patient may benefit from, and is in agreement to Keep therapy appointment today at 1:00 with [facility].    Goals Addressed             This Visit's Progress    Connect for therapy       Care Coordination Interventions: Solution-Focused Strategies employed:  Active listening / Reflection utilized  Problem [place] strategies reviewed Has initial appointment  rescheduled with [facility] for therapy today 07/01/22 Missed initial appointment 06/30/22 unable to locate office Assessed for barriers to appointment/ none identified        SDOH assessments and interventions completed:  Yes  SDOH Interventions Today    Flowsheet Row Most Recent Value  SDOH Interventions   Stress Interventions Provide Counseling        Care Coordination Interventions Activated:  Yes  Care Coordination Interventions:  Yes, provided   Follow up plan: Follow up call scheduled for 08/04/2022    Encounter Outcome:  Pt. Visit Completed   Casimer Lanius, [place] [phone]

## 2022-07-02 ENCOUNTER — Encounter: Payer: Self-pay | Admitting: Internal Medicine

## 2022-07-02 ENCOUNTER — Ambulatory Visit (INDEPENDENT_AMBULATORY_CARE_PROVIDER_SITE_OTHER): Payer: Medicare Other | Admitting: Internal Medicine

## 2022-07-02 VITALS — BP 136/84 | HR 77 | Temp 97.6°F | Ht 63.0 in | Wt 264.0 lb

## 2022-07-02 DIAGNOSIS — I10 Essential (primary) hypertension: Secondary | ICD-10-CM

## 2022-07-02 DIAGNOSIS — Z23 Encounter for immunization: Secondary | ICD-10-CM

## 2022-07-02 DIAGNOSIS — E119 Type 2 diabetes mellitus without complications: Secondary | ICD-10-CM | POA: Diagnosis not present

## 2022-07-02 DIAGNOSIS — I428 Other cardiomyopathies: Secondary | ICD-10-CM | POA: Diagnosis not present

## 2022-07-02 DIAGNOSIS — I5032 Chronic diastolic (congestive) heart failure: Secondary | ICD-10-CM

## 2022-07-02 DIAGNOSIS — Z794 Long term (current) use of insulin: Secondary | ICD-10-CM | POA: Diagnosis not present

## 2022-07-02 NOTE — Patient Instructions (Signed)
www.[email or web address] [email or web address] [email or web address]-[email or web address]                      Heart Failure, Diagnosis  Heart failure is a condition in which the heart has trouble pumping blood. This may mean that the heart cannot pump enough blood out to the body or that the heart does not fill up with enough blood. For some people with heart failure, fluid may back up into the lungs. There may also be swelling (edema) in the lower legs. Heart failure is usually a long-term (chronic) condition. It is important for you to take good care of yourself and follow the treatment plan from your health care provider. Different stages of heart failure have different treatment plans. The stages are: Stage A: At risk for heart failure. Having no symptoms of heart failure, but being at risk for developing heart failure. Stage B: Pre-heart failure. Having no symptoms of heart failure, but having structural changes to the heart that indicate heart failure. Stage C: Symptomatic heart failure. Having symptoms of heart failure in addition to structural changes to the heart that indicate heart failure. Stage D: Advanced heart failure. Having symptoms that interfere with daily life and frequent hospitalizations related to heart failure. What are the causes? This condition may be caused by: High blood pressure (hypertension). Hypertension causes the heart muscle to work harder than normal. Coronary artery disease, or CAD. CAD is the buildup of cholesterol and fat (plaque) in the arteries of the heart. Heart attack, also called myocardial infarction. This injures the heart muscle, making it hard for the heart to pump blood. Abnormal heart valves. The valves do not open and close properly, forcing the heart to pump harder to keep the blood flowing. Heart muscle disease, inflammation,  or infection (cardiomyopathy or myocarditis). This is damage to the heart muscle. It can increase the risk of heart failure. Lung disease. The heart works harder when the lungs are not healthy. What increases the risk? The risk of heart failure increases as a person ages. This condition is also more likely to develop in people who: Are obese. Use tobacco or nicotine products. Abuse alcohol or drugs. Have taken medicines that can damage the heart, such as chemotherapy drugs. Have any of these conditions: Diabetes. Abnormal heart rhythms. Thyroid problems. Low blood counts (anemia). Chronic kidney disease. Have a family history of heart failure. What are the signs or symptoms? Symptoms of this condition include: Shortness of breath with activity, such as when climbing stairs. A cough that does not go away. Swelling of the feet, ankles, legs, or abdomen. Losing or gaining weight for no reason. Trouble breathing when lying flat. Waking from sleep because of the need to sit up and get more air. Rapid heartbeat. Other symptoms may include: Tiredness (fatigue) and loss of energy. Feeling light-headed, dizzy, or close to fainting. Nausea or loss of appetite. Waking up more often during the night to urinate (nocturia). Confusion. How is this diagnosed? This condition is diagnosed based on: Your medical history, symptoms, and a physical exam. Blood tests. Diagnostic tests, which may include: Echocardiogram. Electrocardiogram (ECG).  Chest X-ray. Exercise stress test. Cardiac MRI. Cardiac catheterization and angiogram. Radionuclide scans. How is this treated? Treatment for this condition is aimed at managing the symptoms of heart failure. Medicines Treatment may include medicines that: Help lower blood pressure by relaxing (dilating) the blood vessels. These medicines are called ACE inhibitors (angiotensin-converting enzyme), ARBs (angiotensin receptor blockers), or  vasodilators. Cause the kidneys to remove salt and water from the blood through urination (diuretics). Improve heart muscle strength and prevent the heart from beating too fast (beta blockers). Increase the force of the heartbeat (digoxin). Lower heart rates. Certain diabetes medicines (SGLT-2 inhibitors) may also be used in treatment. Healthy behavior changes Treatment may also include making healthy lifestyle changes, such as: Reaching and staying at a healthy weight. Not using tobacco or nicotine products. Eating heart-healthy foods. Limiting or avoiding alcohol. Stopping the use of illegal drugs. Being physically active. Participating in a cardiac rehabilitation program, which is a treatment program to improve your health and well-being through exercise training, education, and counseling. Other treatments Other treatments may include: Procedures to open blocked arteries or repair damaged valves. Placing a pacemaker to improve heart function (cardiac resynchronization therapy). Placing a device to treat serious abnormal heart rhythms (implantable cardioverter defibrillator, or ICD). Placing a device to improve the pumping ability of the heart (left ventricular assist device, or LVAD). Receiving a healthy heart from a donor (heart transplant). This is done when other treatments have not helped. Follow these instructions at home: Manage other health conditions as told by your health care provider. These may include hypertension, diabetes, thyroid disease, or abnormal heart rhythms. Get ongoing education and support as needed. Learn as much as you can about heart failure. Keep all follow-up visits. This is important. Where to find more information American Heart Association: www.heart.org Centers for Disease Control and Prevention: http://www.wolf.info/ NIH National Institute on Aging: http://kim-miller.com/ Summary Heart failure is a condition in which the heart has trouble pumping blood. This  condition is commonly caused by high blood pressure and other diseases of the heart and lungs. Symptoms of this condition include shortness of breath, tiredness (fatigue), nausea, and swelling of the feet, ankles, legs, or abdomen. Treatments for this condition may include medicines, lifestyle changes, and surgery. Manage other health conditions as told by your health care provider. This information is not intended to replace advice given to you by your health care provider. Make sure you discuss any questions you have with your health care provider. Document Revised: 01/13/2022 Document Reviewed: 04/27/2020 Elsevier Patient Education  [address].

## 2022-07-02 NOTE — Progress Notes (Unsigned)
Subjective:  Patient ID: Stephanie [name], female    DOB: June 30, 1951  Age: 71 y.o. MRN: [id number]  CC: No chief complaint on file.   HPI Stephanie [name] presents for ***  Outpatient Medications Prior to Visit  Medication Sig Dispense Refill   Accu-Chek FastClix Lancets MISC      allopurinol (ZYLOPRIM) 100 MG tablet TAKE 1/2 TABLET(50 MG) BY MOUTH TWICE DAILY (Patient taking differently: Take 50 mg by mouth 2 (two) times daily as needed (for gout flare ups).) 90 tablet 3   ALPRAZolam (XANAX) 0.5 MG tablet Take 1 tablet (0.5 mg total) by mouth 3 (three) times daily as needed for anxiety. (Patient taking differently: Take 0.5 mg by mouth at bedtime as needed for sleep.) 270 tablet 0   amoxicillin-clavulanate (AUGMENTIN) 875-125 MG tablet Take 1 tablet by mouth every 12 (twelve) hours for 11 days. 22 tablet 0   Blood Pressure KIT 1 kit by Does not apply route daily. Check blood pressure once a day 1 kit 0   colchicine 0.6 MG tablet Take 0.6 mg by mouth daily as needed (gout).     Continuous Blood Gluc Receiver (DEXCOM G7 RECEIVER) DEVI 1 Act by Does not apply route daily. 9 each 1   Continuous Blood Gluc Sensor (DEXCOM G7 SENSOR) MISC 1 Act by Does not apply route daily. 9 each 1   dabigatran (PRADAXA) 150 MG CAPS capsule TAKE 1 CAPSULE BY MOUTH TWICE DAILY( EVERY TWELVE HOURS) (Patient taking differently: Take 150 mg by mouth 2 (two) times daily.) 60 capsule 5   dronedarone (MULTAQ) 400 MG tablet Take 1 tablet (400 mg total) by mouth 2 (two) times daily. 180 tablet 2   hydrOXYzine (ATARAX) 10 MG tablet Take 1 tablet (10 mg total) by mouth every 8 (eight) hours as needed. 270 tablet 0   Insulin Glargine (BASAGLAR KWIKPEN) 100 UNIT/ML Inject 30 Units into the skin daily. 27 mL 1   Insulin Pen Needle (NOVOFINE) 30G X 8 MM MISC For Humalog meal time TID 100 each 11   JARDIANCE 25 MG TABS tablet Take 1 tablet (25 mg total) by mouth daily. 90 tablet 1   metoprolol succinate (TOPROL XL) 25 MG 24 hr  tablet Take 1 tablet (25 mg total) by mouth daily. 90 tablet 3   NOVOLOG FLEXPEN 100 UNIT/ML FlexPen Inject 5 Units into the skin 3 (three) times daily with meals. 15 mL 0   potassium chloride SA (KLOR-CON M15) 15 MEQ tablet Take 1 tablet (15 mEq total) by mouth 3 (three) times daily. 270 tablet 0   sacubitril-valsartan (ENTRESTO) 24-26 MG Take 1 tablet by mouth 2 (two) times daily. 180 tablet 2   simvastatin (ZOCOR) 10 MG tablet Take 1 tablet (10 mg total) by mouth daily. 90 tablet 1   Finerenone (KERENDIA) 10 MG TABS Take 1 tablet by mouth daily. 30 tablet 0   furosemide (LASIX) 40 MG tablet Take 1 tablet (40 mg total) by mouth daily. 90 tablet 3   No facility-administered medications prior to visit.    ROS Review of Systems  Objective:  BP 136/84 (BP Location: Right Arm, Patient Position: Sitting, Cuff Size: Large)   Pulse 77   Temp 97.6 F (36.4 C) (Oral)   Ht '5\' 3"'  (1.6 m)   Wt 264 lb (119.7 kg)   SpO2 95%   BMI 46.77 kg/m   BP Readings from Last 3 Encounters:  07/02/22 136/84  06/21/22 (!) 155/92  06/10/22 138/84    Wt Readings from  Last 3 Encounters:  07/02/22 264 lb (119.7 kg)  06/21/22 258 lb (117 kg)  06/10/22 265 lb (120.2 kg)    Physical Exam  Lab Results  Component Value Date   WBC 4.5 06/21/2022   HGB 10.0 (L) 06/21/2022   HCT 32.0 (L) 06/21/2022   PLT 212 06/21/2022   GLUCOSE 167 (H) 06/20/2022   CHOL 179 02/12/2022   TRIG 129 02/12/2022   HDL 55 02/12/2022   LDLCALC 101 02/12/2022   ALT 8 06/20/2022   AST 11 (L) 06/20/2022   NA 140 06/20/2022   K 3.7 06/20/2022   CL 111 06/20/2022   CREATININE 0.97 06/20/2022   BUN 13 06/20/2022   CO2 27 06/20/2022   TSH 2.78 03/18/2022   INR 1.2 (H) 04/05/2013   HGBA1C 8.9 (H) 06/10/2022   MICROALBUR 14.13 02/12/2022    CT ABDOMEN PELVIS W CONTRAST  Result Date: 06/19/2022 CLINICAL DATA:  Abdominal pain for 2 days EXAM: CT ABDOMEN AND PELVIS WITH CONTRAST TECHNIQUE: Multidetector CT imaging of the  abdomen and pelvis was performed using the standard protocol following bolus administration of intravenous contrast. RADIATION DOSE REDUCTION: This exam was performed according to the departmental dose-optimization program which includes automated exposure control, adjustment of the mA and/or kV according to patient size and/or use of iterative reconstruction technique. CONTRAST:  27m OMNIPAQUE IOHEXOL 300 MG/ML  SOLN COMPARISON:  01/26/2022 FINDINGS: Lower chest: No acute abnormality. Hepatobiliary: No focal liver abnormality is seen. Status post cholecystectomy. No biliary dilatation. Pancreas: Unremarkable. No pancreatic ductal dilatation or surrounding inflammatory changes. Spleen: Normal in size without focal abnormality. Adrenals/Urinary Tract: Adrenal glands are within normal limits. Kidneys demonstrate a normal enhancement pattern. No renal calculi or obstructive changes are seen. Bladder is partially distended. Stomach/Bowel: Diverticular change is seen. Some surrounding inflammatory changes are noted at the junction of the rectum and sigmoid consistent with diverticulitis. Few small foci of air are noted which likely represent micro perforations. No abscess is seen. The stomach is within normal limits. Small bowel is unremarkable. The appendix is within normal limits. Stomach is within normal limits. Vascular/Lymphatic: No significant vascular findings are present. No enlarged abdominal or pelvic lymph nodes. Reproductive: Status post hysterectomy. No adnexal masses. Other: No free fluid is noted. Musculoskeletal: Degenerative changes of lumbar spine are noted. No rib abnormality is seen. IMPRESSION: Diverticulitis similar to that noted on the prior exam. A few small extraluminal foci of air are noted consistent with micro perforations. No abscess is noted. Postsurgical changes. Electronically Signed   By: MInez CatalinaM.D.   On: 06/19/2022 03:07    Assessment & Plan:   Diagnoses and all orders for  this visit:  NICM (nonischemic cardiomyopathy) ([place] -     furosemide (LASIX) 40 MG tablet; Take 1 tablet (40 mg total) by mouth daily.  Insulin-requiring or dependent type II diabetes mellitus ([place]  Flu vaccine need -     Flu Vaccine QUAD High Dose(Fluad)  Essential hypertension -     furosemide (LASIX) 40 MG tablet; Take 1 tablet (40 mg total) by mouth daily.  Chronic diastolic heart failure (HCC) -     furosemide (LASIX) 40 MG tablet; Take 1 tablet (40 mg total) by mouth daily.   I have discontinued [place] I am also having her maintain her Insulin Pen Needle, NovoLOG FlexPen, Accu-Chek FastClix Lancets, Blood Pressure, metoprolol succinate, allopurinol, colchicine, dabigatran, ALPRAZolam, hydrOXYzine, simvastatin, Jardiance, Basaglar KwikPen, Dexcom G7 Sensor, DJ[employer] App[employer] GroupReceiver, potassium chloride SA, ESan Rafael [place]  and furosemide.  Meds ordered this encounter  Medications   furosemide (LASIX) 40 MG tablet    Sig: Take 1 tablet (40 mg total) by mouth daily.    Dispense:  90 tablet    Refill:  1     Follow-up: Return in about 3 months (around 10/01/2022).  Scarlette Calico, MD

## 2022-07-03 MED ORDER — FUROSEMIDE 40 MG PO TABS: 40.0000 mg | ORAL_TABLET | Freq: Every day | ORAL | 1 refills | Status: DC

## 2022-07-20 ENCOUNTER — Other Ambulatory Visit: Payer: Self-pay | Admitting: Internal Medicine

## 2022-08-04 ENCOUNTER — Ambulatory Visit: Payer: Self-pay | Admitting: Licensed Clinical Social Worker

## 2022-08-04 NOTE — Patient Outreach (Signed)
  Care Coordination  Follow Up Visit Note   08/04/2022 Name: Stephanie [name] MRN: [id number] DOB: 1950-12-02  Stephanie [name] is a 71 y.o. year old female who sees [name], MD for primary care. I spoke with  [name] by phone today.  What matters to the patients health and wellness today?   Getting reconnected to therapist and exploring enhanced benefits.   Goals Addressed             This Visit's Progress    Care Coordination Activities/ Connect for therapy       Care Coordination Interventions: Motivational Interviewing employed Solution-Focused Strategies employed:  Active listening / Reflection utilized  Problem [place]ving [place] strategies reviewed Provided psychoeducation for mental health needs  Participation in counseling encouraged  Collaborated with [facility] ref. Scheduling next appointment and  barriers with continuing therapy with agency below [place]. [place], [place] [address] [email or web address]  Phone: [phone]   Reviewed enhanced Benefits education :(Discussed options for Medicare providers)   [place]  Telephone- [phone]  Address: [address] [place], [place] [address]        SDOH assessments and interventions completed:  No    Care Coordination Interventions Activated:  Yes  Care Coordination Interventions:  Yes, provided   Follow up plan: Follow up call scheduled for 08/11/2022    Encounter Outcome:  Pt. Visit Completed   Casimer Lanius, [place] [phone]

## 2022-08-04 NOTE — Patient Instructions (Signed)
Visit Information  Thank you for taking time to visit with me today. Please don't hesitate to contact me if I can be of assistance to you.   Following are the goals we discussed today:   Goals Addressed             This Visit's Progress    Care Coordination Activities/ Connect for therapy       Care Coordination Interventions: Motivational Interviewing employed Solution-Focused Strategies employed:  Active listening / Reflection utilized  Problem Solving [place] strategies reviewed Provided psychoeducation for mental health needs  Participation in counseling encouraged  Collaborated with [facility] ref. Scheduling next appointment and  barriers with continuing therapy with agency below [place]. [place], [place] [address] [email or web address]  Phone: [phone]   Reviewed enhanced Benefits education :(Discussed options for Medicare providers)   [place]  Telephone- [phone]  Address: [address] [place], [place] [address]         [place] next appointment is by telephone on 10/24 at 9:30  Please call the care guide team at [phone] if you need to cancel or reschedule your appointment.   If you are experiencing a Mental Health or [place] or need someone to talk to, please call the Suicide and Crisis Lifeline: 988 call the [place] National Suicide Prevention Lifeline: 980-655-2666 or TTY: 814-572-2957 TTY 340 221 1235) to talk to a trained counselor call 1-800-273-TALK (toll free, 24 hour hotline) go to [facility] [address] [place] [phone])   Patient verbalizes understanding of instructions and care plan provided today and agrees to view in [place]. Active MyChart status and patient understanding of how to access instructions and care plan via MyChart confirmed with patient.     Casimer Lanius,  [place] [phone]

## 2022-08-11 ENCOUNTER — Ambulatory Visit: Payer: Self-pay | Admitting: Licensed Clinical Social Worker

## 2022-08-11 DIAGNOSIS — F411 Generalized anxiety disorder: Secondary | ICD-10-CM

## 2022-08-11 DIAGNOSIS — F322 Major depressive disorder, single episode, severe without psychotic features: Secondary | ICD-10-CM

## 2022-08-11 NOTE — Patient Outreach (Signed)
  Care Coordination  Follow Up Visit Note   08/11/2022 Name: Stephanie [name] MRN: [id number] DOB: [birth year]  Stephanie [name] is a 71 y.o. year old female who sees Stephanie Lima, MD for primary care. I spoke with  Stephanie [name] by phone today.  What matters to the patients health and wellness today?  Managing stress  Patient continues to experience symptoms of stress which seems to be exacerbated by financial concerns.. Previous therapy not taking new patients and has not called patient back   Recommendation: Patient may benefit from, and is in agreement to To talk to community resource options discussed today (about housing and insurance benefits) Allow LCSW to make referral for therapy.     Goals Addressed             This Visit's Progress    Care Coordination Activities       Care Coordination Interventions: Solution-Focused Strategies employed:  Active listening / Reflection utilized  Problem [place] strategies reviewed Discussed referral options to connect for ongoing therapy: Based on insurance and need  Made referral to [place]  Enhanced Benefits connected with insurance provider:(Has appointment 10/26 to discuss options)  Housing  (Concerned with rent increase; provided information for [employer]  [phone])    enhanced Benefits education :(Discussed options for Medicare providers)   [place]  Telephone- [phone]  Address: [address] [place], [place] [address]        SDOH assessments and interventions completed:  No    Care Coordination Interventions Activated:  Yes  Care Coordination Interventions:  Yes, provided   Follow up plan: Follow up call scheduled for 08/25/22    Encounter Outcome:  Pt. Visit Completed   Stephanie [name], [place] [phone]

## 2022-08-11 NOTE — Patient Instructions (Signed)
Visit Information  Thank you for taking time to visit with me today. Please don't hesitate to contact me if I can be of assistance to you.   Following are the goals we discussed today:   Goals Addressed             This Visit's Progress    Care Coordination Activities       Care Coordination Interventions: Solution-Focused Strategies employed:  Active listening / Reflection utilized  Problem [place] strategies reviewed Discussed referral options to connect for ongoing therapy: Based on insurance and need  Made referral to [place] connected with insurance provider:(Has appointment 10/26 to discuss options)  Housing  (Concerned with rent increase; provided information for [employer]  [phone])    enhanced Benefits education :(Discussed options for Medicare providers)   [place]  Telephone- [phone]  Address: [address] [place], [place] [address]         Our next appointment is by telephone on 08/25/22 at 9:30  Please call the care guide team at [phone] if you need to cancel or reschedule your appointment.   If you are experiencing a Mental Health or [place] or need someone to talk to, please call the Suicide and Crisis Lifeline: 988 call the [place] National Suicide Prevention Lifeline: (351)200-3796 or TTY: (709)144-1267 TTY (226) 016-2312) to talk to a trained counselor call 1-800-273-TALK (toll free, 24 hour hotline) go to [facility] [address] [place] [phone])   Patient verbalizes understanding of instructions and care plan provided today and agrees to view in [place]. Active MyChart status and patient understanding of how to access instructions and care plan via MyChart confirmed with patient.     Casimer Lanius, [place] [phone]

## 2022-08-25 ENCOUNTER — Ambulatory Visit: Payer: Self-pay | Admitting: Licensed Clinical Social Worker

## 2022-08-25 NOTE — Patient Outreach (Signed)
  Care Coordination  Follow Up Visit Note   08/25/2022 Name: Stephanie [name] MRN: [id number] DOB: 07-Jan-1951  Stephanie [name] is a 71 y.o. year old female who sees [name], MD for primary care. I spoke with  [name] by phone today.  What matters to the patients health and wellness today?  Managing health  Patient is making progress with managing her stress  .   Recommendation: Patient may benefit from, and is in agreement to Keep therapy appointment with Dr. Michail Sermon Follow up with resources discussed for housing support.    Goals Addressed             This Visit's Progress    Care Coordination Activities       Care Coordination Interventions: Solution-Focused Strategies employed:  Active listening / Reflection utilized  Emotional Support Provided Problem [place] strategies reviewed Participation in counseling encouraged : Has initial therapy appointment 09/17/22 at [facility] connected with insurance provider:(Will have [facility] Medicare Dual completed starting Jan 2024)  Housing  (Concerned with rent increase; will work with [employer]  [phone])  Assessed for other needs( no new needs identified)        SDOH assessments and interventions completed:  No    Care Coordination Interventions Activated:  Yes  Care Coordination Interventions:  Yes, provided   Follow up plan: Follow up call scheduled for 30 days  09/24/22    Encounter Outcome:  Pt. Visit Completed   Casimer Lanius, [place] [phone]

## 2022-08-25 NOTE — Patient Instructions (Signed)
Visit Information  Thank you for taking time to visit with me today. Please don't hesitate to contact me if I can be of assistance to you.   Following are the goals we discussed today:   Goals Addressed             This Visit's Progress    Care Coordination Activities       Care Coordination Interventions: Solution-Focused Strategies employed:  Active listening / Reflection utilized  Emotional Support Provided Problem [place] strategies reviewed Participation in counseling encouraged : Has initial therapy appointment 09/17/22 at [place] connected with insurance provider:(Will have [facility] Medicare Dual completed starting Jan 2024)  Housing  (Concerned with rent increase; will work with [employer]  [phone])  Assessed for other needs( no new needs identified)        Our next appointment is by telephone on 09/24/2022 at 9:30  Please call the care guide team at [phone] if you need to cancel or reschedule your appointment.   If you are experiencing a Mental Health or [place] or need someone to talk to, please call the Suicide and Crisis Lifeline: 988 call the [place] National Suicide Prevention Lifeline: (561)805-8645 or TTY: (607) 501-7134 TTY 640-230-2454) to talk to a trained counselor call 1-800-273-TALK (toll free, 24 hour hotline) go to [facility] [address], [place] [phone])   Patient verbalizes understanding of instructions and care plan provided today and agrees to view in [place]. Active MyChart status and patient understanding of how to access instructions and care plan via MyChart confirmed with patient.     Casimer Lanius, [place] [phone]

## 2022-08-27 ENCOUNTER — Other Ambulatory Visit: Payer: Self-pay | Admitting: Student

## 2022-08-27 ENCOUNTER — Other Ambulatory Visit: Payer: Self-pay | Admitting: Family Medicine

## 2022-08-27 NOTE — Telephone Encounter (Signed)
Rx refill request approved per Dr. Corey's orders. 

## 2022-09-17 ENCOUNTER — Ambulatory Visit (INDEPENDENT_AMBULATORY_CARE_PROVIDER_SITE_OTHER): Payer: Medicare Other | Admitting: Psychologist

## 2022-09-17 DIAGNOSIS — F331 Major depressive disorder, recurrent, moderate: Secondary | ICD-10-CM | POA: Diagnosis not present

## 2022-09-17 NOTE — Plan of Care (Signed)

## 2022-09-17 NOTE — Progress Notes (Signed)
                Rafay Dahan, PsyD 

## 2022-09-17 NOTE — Progress Notes (Signed)
[facility] Counselor Initial Adult Exam  Name: Stephanie [name] Date: 09/17/2022 MRN: [id number] DOB: 1950/10/22 PCP: [name], MD  Time spent: 11:05 am to 11:45 am; total time: 40 minutes  This session was held via in person. The patient consented to in-person therapy and was in the clinician's office. Limits of confidentiality were discussed with the patient.    Guardian/Payee:  NA    Paperwork requested: No   Reason for Visit /Presenting Problem: Depression  Mental Status Exam: Appearance:   Well Groomed     Behavior:  Appropriate  Motor:  Normal  Speech/Language:   Clear and Coherent  Affect:  Flat  Mood:  depressed  Thought process:  normal  Thought content:    WNL  Sensory/Perceptual disturbances:    WNL  Orientation:  oriented to person, place, and time/date  Attention:  Good  Concentration:  Good  Memory:  WNL  Fund of knowledge:   Fair  Insight:    Fair  Judgment:   Fair  Impulse Control:  Good     Reported Symptoms:  The patient endorsed experiencing the following: feeling down, sad, tearful, social isolation, rumination of thoughts, fatigue, low self-esteem, difficulty with sleep, and irritability. She denied suicidal and homicidal ideation.   Risk Assessment: Danger to Self:  No Self-injurious Behavior: No Danger to Others: No Duty to Warn:no Physical Aggression / Violence:No  Access to Firearms a concern: No  Gang Involvement:No  Patient / guardian was educated about steps to take if suicide or homicide risk level increases between visits: no While future psychiatric events cannot be accurately predicted, the patient does not currently require acute inpatient psychiatric care and does not currently meet [facility] involuntary commitment criteria.  Substance Abuse History: Current substance abuse: No     Past Psychiatric History:   Previous psychological history is significant for depression Outpatient Providers:NA History  of Psych Hospitalization: No  Psychological Testing:  NA    Abuse History:  Victim of: No.,  NA    Report needed: No. Victim of Neglect:No. Perpetrator of  NA   Witness / Exposure to Domestic Violence: No   Protective Services Involvement: No  Witness to [employer] Violence:  No   Family History:  Family History  Problem Relation Age of Onset   Pneumonia Father        deceased   Hypertension Father    Cancer Father    Multiple sclerosis Mother        hx   Emphysema Other        runs in the family    Living situation: the patient lives with their family  Sexual Orientation: Straight  Relationship Status: single  Name of spouse / other:NA If a parent, number of children / ages:Patient stated that she has five children  Support Systems: denied  Financial Stress:  Yes   Income/Employment/Disability: Primary school teacher Service: No   Educational History: Education: some college  Religion/Sprituality/World View: Christian  Any cultural differences that may affect / interfere with treatment:  not applicable   Recreation/Hobbies: Being alone  Stressors: Other: conflict with children    Strengths: Self Advocate  Barriers:  Estranged relationships with children   Legal History: Pending legal issue / charges: The patient has no significant history of legal issues. History of legal issue / charges:  na  Medical History/Surgical History: reviewed Past Medical History:  Diagnosis Date   Anemia    Ankle fracture, right    x  2   Antineoplastic and immunosuppressive drugs causing adverse effect in therapeutic use    Asthma    Chronic venous hypertension without complications    Colon polyps 2009   Coronary artery disease    no CAD by cath 11.2010   Depressive disorder, not elsewhere classified    Diabetes mellitus type 2 in obese (HCC)    hgb AIC 6.1 09/02/2009, insulin dependent    Diastolic CHF, chronic (HCC)    reports stable    Diverticulosis of colon 2009   colonoscopy   Gout    cortisone  shots helped   Hepatitis, unspecified    hx of/per pt, never was told of having hepatitis   Hx of hysterectomy 2002   Hyperlipidemia    Hypertension    controlled on medication    Hypothyroidism    reports her thryoid levels are normal now    Irritable bowel syndrome    Lung nodule    55m RLL nodule on CT Chest  07/2009, resolved by 2011 CT   Noncompliance    Obesity    Persistent atrial fibrillation ([place]    s/p DCCV 07/2009; Pradaxa Rx, states she is in sinus rhythm now    PONV (postoperative nausea and vomiting)    after receiving "too much anesthesia" after a hernia surgery    Sarcoidosis    dx by eye exam; seen during eye exam , report asymtptomatic "i dont have it now'    Sleep disturbance 06/2013   sleep study, mild abnormality, no criteria for CPAP    Past Surgical History:  Procedure Laterality Date   ABDOMINAL HYSTERECTOMY     CARDIOVERSION     x 2   CARDIOVERSION N/A 02/27/2013   Procedure: CARDIOVERSION;  Surgeon: BLelon Perla MD;  [place] [facility]  Service: Cardiovascular;  Laterality: N/A;   CARDIOVERSION N/A 03/24/2017   Procedure: CARDIOVERSION;  Surgeon: NJosue Hector MD;  [facility]  Service: Cardiovascular;  Laterality: N/A;   CARDIOVERSION N/A 03/04/2020   Procedure: CARDIOVERSION;  Surgeon: NJosue Hector MD;  [facility];  Service: Cardiovascular;  Laterality: N/A;   CHOLECYSTECTOMY  early 80s   COLONOSCOPY     Diverticulosis, colon polyps, repeat 2014, Dr. KDeatra Ina  COLONOSCOPY WITH PROPOFOL N/A 07/25/2019   Procedure: COLONOSCOPY WITH PROPOFOL;  Surgeon: AYetta Flock MD;  [facility];  Service: Gastroenterology;  Laterality: N/A;   ESOPHAGOGASTRODUODENOSCOPY N/A 05/16/2014   Procedure: ESOPHAGOGASTRODUODENOSCOPY (EGD);  Surgeon: SWonda Horner MD;  [facility];  Service: Gastroenterology;  Laterality: N/A;   HERNIA REPAIR  20383   umbilical hernia   MYOMECTOMY  2000   POLYPECTOMY  07/25/2019   Procedure: POLYPECTOMY;  Surgeon: AYetta Flock MD;  [facility];  Service: Gastroenterology;;    Medications: Current Outpatient Medications  Medication Sig Dispense Refill   Accu-Chek FastClix Lancets MISC      allopurinol (ZYLOPRIM) 100 MG tablet TAKE 1/2 TABLET(50 MG) BY MOUTH TWICE DAILY 90 tablet 3   ALPRAZolam (XANAX) 0.5 MG tablet Take 1 tablet (0.5 mg total) by mouth 3 (three) times daily as needed for anxiety. (Patient taking differently: Take 0.5 mg by mouth at bedtime as needed for sleep.) 270 tablet 0   BD PEN NEEDLE NANO 2ND GEN 32G X 4 MM MISC USE EVERY DAY AS DIRECTED 100 each 1   Blood Pressure KIT 1 kit by Does not apply route daily. Check blood pressure once a day 1 kit 0  colchicine 0.6 MG tablet Take 0.6 mg by mouth daily as needed (gout).     Continuous Blood Gluc Receiver (DEXCOM G7 RECEIVER) DEVI 1 Act by Does not apply route daily. 9 each 1   Continuous Blood Gluc Sensor (DEXCOM G7 SENSOR) MISC 1 Act by Does not apply route daily. 9 each 1   dabigatran (PRADAXA) 150 MG CAPS capsule TAKE 1 CAPSULE BY MOUTH TWICE DAILY( EVERY TWELVE HOURS) (Patient taking differently: Take 150 mg by mouth 2 (two) times daily.) 60 capsule 5   dronedarone (MULTAQ) 400 MG tablet Take 1 tablet (400 mg total) by mouth 2 (two) times daily. 180 tablet 2   furosemide (LASIX) 40 MG tablet Take 1 tablet (40 mg total) by mouth daily. 90 tablet 1   hydrOXYzine (ATARAX) 10 MG tablet Take 1 tablet (10 mg total) by mouth every 8 (eight) hours as needed. 270 tablet 0   Insulin Glargine (BASAGLAR KWIKPEN) 100 UNIT/ML Inject 30 Units into the skin daily. 27 mL 1   Insulin Pen Needle (NOVOFINE) 30G X 8 MM MISC For Humalog meal time TID 100 each 11   JARDIANCE 25 MG TABS tablet Take 1 tablet (25 mg total) by mouth daily. 90 tablet 1   metoprolol succinate (TOPROL-XL) 25 MG 24 hr tablet TAKE 1 TABLET(25 MG) BY MOUTH DAILY  90 tablet 1   NOVOLOG FLEXPEN 100 UNIT/ML FlexPen Inject 5 Units into the skin 3 (three) times daily with meals. 15 mL 0   potassium chloride SA (KLOR-CON M15) 15 MEQ tablet Take 1 tablet (15 mEq total) by mouth 3 (three) times daily. 270 tablet 0   sacubitril-valsartan (ENTRESTO) 24-26 MG Take 1 tablet by mouth 2 (two) times daily. 180 tablet 2   simvastatin (ZOCOR) 10 MG tablet Take 1 tablet (10 mg total) by mouth daily. 90 tablet 1   No current facility-administered medications for this visit.    Allergies  Allergen Reactions   Amiodarone Other (See Comments)    adverse reaction: Tremor/gait disurbance   Aspirin Other (See Comments)    "Burns my stomach"   Methimazole Nausea And Vomiting   Propoxyphene Nausea And Vomiting   Trintellix [Vortioxetine] Nausea And Vomiting   Albuterol Palpitations   Atorvastatin Other (See Comments)    Body aches and pains--stiffness.    Livalo [Pitavastatin]     Body aches    Diagnoses:   F33.1 major depressive affective disorder, recurrent, moderate  Plan of Care: The patient is a 71 year old Black woman who was referred. The patient lives with her son. The patient meets criteria for a diagnosis of F33.1 major depressive affective disorder, recurrent, moderate based off of the following: feeling down, sad, tearful, social isolation, rumination of thoughts, fatigue, low self-esteem, difficulty with sleep, and irritability. She denied suicidal and homicidal ideation.   The patient stated that she wants to process her life and work through challenges  This psychologist makes the recommendation that the patient participate in therapy bi-weekly   Conception Chancy, PsyD

## 2022-09-24 ENCOUNTER — Ambulatory Visit: Payer: Self-pay | Admitting: Licensed Clinical Social Worker

## 2022-09-24 NOTE — Patient Outreach (Signed)
  Care Coordination   Follow Up Visit Note   09/24/2022 Name: Stephanie [name] MRN: [id number] DOB: [birth year]  Stephanie [name] is a 71 y.o. year old female who sees [name], MD for primary care. I spoke with  [name] by phone today.  What matters to the patients health and wellness today?   Patient continues to experience difficulty with symptoms of anxiety and depression Recommendation: Patient may benefit from, and is in agreement to : continue with therapy and explore other options. .    Goals Addressed             This Visit's Progress    Care Coordination Activities       Care Coordination Interventions: Solution-Focused Strategies employed:  Active listening / Reflection utilized  Problem [place] strategies reviewed Participation in counseling encouraged : Had initial therapy appointment at [place] connected with insurance provider:(Will have [facility] Medicare Dual completed starting Jan 2024)          SDOH assessments and interventions completed:  No    Care Coordination Interventions:  Yes, provided   Follow up plan: Follow up call scheduled for 10/13/22    Encounter Outcome:  Pt. Visit Completed   Casimer Lanius, [place] [phone]

## 2022-09-24 NOTE — Patient Instructions (Signed)
Visit Information  Thank you for taking time to visit with me today. Please don't hesitate to contact me if I can be of assistance to you.   Following are the goals we discussed today:   Goals Addressed             This Visit's Progress    Care Coordination Activities       Care Coordination Interventions: Solution-Focused Strategies employed:  Active listening / Reflection utilized  Problem [place] strategies reviewed Participation in counseling encouraged : Had initial therapy appointment at [place] connected with insurance provider:(Will have [facility] Medicare Dual completed starting Jan 2024)          Our next appointment is by telephone on 12/26//23 at 9:00  Please call the care guide team at [phone] if you need to cancel or reschedule your appointment.   If you are experiencing a Mental Health or [place] or need someone to talk to, please call the Suicide and Crisis Lifeline: 988 call the [place] National Suicide Prevention Lifeline: 458-489-0294 or TTY: 281-665-3087 TTY 2793495513) to talk to a trained counselor call 1-800-273-TALK (toll free, 24 hour hotline) go to [facility] [address] [place] [phone])   Patient verbalizes understanding of instructions and care plan provided today and agrees to view in [place]. Active MyChart status and patient understanding of how to access instructions and care plan via MyChart confirmed with patient.     Casimer Lanius, [place] [phone]

## 2022-10-05 ENCOUNTER — Ambulatory Visit: Payer: Self-pay | Admitting: Licensed Clinical Social Worker

## 2022-10-05 NOTE — Patient Outreach (Signed)
  Care Coordination  Follow Up Visit Note   10/05/2022 Name: Stephanie [name] MRN: [id number] DOB: 07/30/1951  Stephanie [name] is a 71 y.o. year old female who sees [name], MD for primary care. I spoke with  [name] by phone today.  What matters to the patients health and wellness today?    Patient continues to  experience difficulty with connecting to a provider so that she can manage her mental health need. Recommendation: Patient may benefit from, and is in agreement to :  Allow LCSW to make a referral to a provider medication management and counseling    Goals Addressed             This Visit's Progress    Care Coordination Activities for mental health support       Care Coordination Interventions: Motivational Interviewing employed Solution-Focused Strategies employed:  Active listening / Reflection utilized  Emotional Support Provided Problem [place] strategies reviewed Provided general psycho-education for mental health needs  Participation in counseling encouraged :   Discussed referral options to connect for ongoing therapy:   Discussed referral for psychiatry:   Collaborated with and discussed options based on need and insurance  Made referral to [place] to Geriatric psychiatry for medication and counseling Provided mediation to assist with concerns for mental health needs          SDOH assessments and interventions completed:  No   Care Coordination Interventions:  Yes, provided   Follow up plan: Follow up call scheduled for 1 week    Encounter Outcome:  Pt. Visit Completed   Casimer Lanius, [place] [phone]

## 2022-10-05 NOTE — Patient Instructions (Signed)
Visit Information  Thank you for taking time to visit with me today. Please don't hesitate to contact me if I can be of assistance to you.   Following are the goals we discussed today:   Goals Addressed             This Visit's Progress    Care Coordination Activities for mental health support       Care Coordination Interventions: Motivational Interviewing employed Solution-Focused Strategies employed:  Active listening / Reflection utilized  Emotional Support Provided Problem [place] strategies reviewed Provided general psycho-education for mental health needs  Participation in counseling encouraged :   Discussed referral options to connect for ongoing therapy:   Discussed referral for psychiatry:   Collaborated with and discussed options based on need and insurance  Made referral to [place] to Geriatric psychiatry for medication and counseling Provided mediation to assist with concerns for mental health needs          Our next appointment is by telephone on 10/13/22   Please call the care guide team at [phone] if you need to cancel or reschedule your appointment.   If you are experiencing a Mental Health or [place] or need someone to talk to, please call the Suicide and Crisis Lifeline: 988 call the [place] National Suicide Prevention Lifeline: 321 241 5434 or TTY: (360)305-5944 TTY 812-285-5178) to talk to a trained counselor call 1-800-273-TALK (toll free, 24 hour hotline) go to [facility] [address], [place] [phone])   Patient verbalizes understanding of instructions and care plan provided today and agrees to view in [place]. Active MyChart status and patient understanding of how to access instructions and care plan via MyChart confirmed with patient.      Casimer Lanius, [place] [phone]

## 2022-10-13 ENCOUNTER — Ambulatory Visit: Payer: Self-pay | Admitting: Licensed Clinical Social Worker

## 2022-10-13 NOTE — Patient Instructions (Signed)
Visit Information  Thank you for taking time to visit with me today. Please don't hesitate to contact me if I can be of assistance to you.   Following are the goals we discussed today:   Goals Addressed             This Visit's Progress    Care Coordination Activities for mental health support       Care Coordination Interventions: Solution-Focused Strategies employed:  Mindfulness or Relaxation training provided Active listening / Reflection utilized  Emotional Support Provided Problem [place] strategies reviewed Provided general psycho-education for mental health needs  Participation in counseling encouraged :   Provided EMMI education information on :breathing to relax Discussed referral options to connect for ongoing therapy:   Discussed referral for psychiatry:   Made referral to [place] to Geriatric psychiatry for medication and counseling appointment scheduled in Jan.8th          Our next appointment is by telephone on 10/28/22 at 9:30  Please call the care guide team at [phone] if you need to cancel or reschedule your appointment.   If you are experiencing a Mental Health or [place] or need someone to talk to, please call the Suicide and Crisis Lifeline: 988 call the [place] National Suicide Prevention Lifeline: 970 319 4339 or TTY: (401)538-3631 TTY 859 216 1589) to talk to a trained counselor call 1-800-273-TALK (toll free, 24 hour hotline) go to [facility] [address] [place] [phone])   Patient verbalizes understanding of instructions and care plan provided today and agrees to view in [place]. Active MyChart status and patient understanding of how to access instructions and care plan via MyChart confirmed with patient.     Casimer Lanius, [place] [phone]

## 2022-10-13 NOTE — Patient Outreach (Signed)
  Care Coordination  Follow Up Visit Note   10/13/2022 Name: Stephanie [name] MRN: [id number] DOB: 07/29/1951  Stephanie [name] is a 71 y.o. year old female who sees Stephanie Lima, MD for primary care. I spoke with  Stephanie [name] by phone today.  What matters to the patients health and wellness today?  Connecting for mental health treatment    Goals Addressed             This Visit's Progress    Care Coordination Activities for mental health support       Care Coordination Interventions: Solution-Focused Strategies employed:  Mindfulness or Relaxation training provided Active listening / Reflection utilized  Emotional Support Provided Problem [place] strategies reviewed Provided general psycho-education for mental health needs  Participation in counseling encouraged :   Provided EMMI education information on :breathing to relax Discussed referral options to connect for ongoing therapy:   Discussed referral for psychiatry:   Made referral to [place] to Geriatric psychiatry for medication and counseling appointment scheduled in Jan.8th          SDOH assessments and interventions completed:  No    Care Coordination Interventions:  Yes, provided   Follow up plan: Follow up call scheduled for 2 week f/u     Encounter Outcome:  Pt. Visit Completed   Stephanie [name], [place] [phone]

## 2022-10-22 ENCOUNTER — Ambulatory Visit: Payer: Medicare Other | Admitting: Psychologist

## 2022-10-28 ENCOUNTER — Ambulatory Visit: Payer: Self-pay | Admitting: Licensed Clinical Social Worker

## 2022-10-28 NOTE — Patient Outreach (Signed)
  Care Coordination  Follow Up Visit Note   10/28/2022 Name: Stephanie [name] MRN: [id number] DOB: 08/08/1951  Stephanie [name] is a 72 y.o. year old female who sees Stephanie Lima, MD for primary care. I spoke with  Stephanie [name] by phone today.  What matters to the patients health and wellness today?   Patient continues to experience symptoms of depression.  She connected with the mental health provider and will pick up Rx this week.   Recommendation: Patient may benefit from, and is in agreement to :  Keep appointment with mental health provider Take medication as prescribed     Goals Addressed             This Visit's Progress    COMPLETED: Care Coordination Activities for mental health support No Follow up Required       Care Coordination Interventions: Solution-Focused Strategies employed:  Active listening / Reflection utilized  Emotional Support Provided Problem [place] strategies reviewed Made referral to [place] for medication and counseling initial appointment completed Jan.8th  and follow up appointment scheduled Jan. 23rd         SDOH assessments and interventions completed:  No   Care Coordination Interventions:  Yes, provided   Follow up plan: No further follow up or interventions at this time. Patient is not eligible for long term management by the Care Coordination team  Encounter Outcome:  Pt. Visit Completed   Casimer Lanius, [place] [phone]

## 2022-10-28 NOTE — Patient Instructions (Signed)
Visit Information  Thank you for taking time to visit with me today. Please don't hesitate to contact me if I can be of assistance to you.   Following are the goals we discussed today:   Goals Addressed             This Visit's Progress    COMPLETED: Care Coordination Activities for mental health support No Follow up Required       Care Coordination Interventions: Solution-Focused Strategies employed:  Active listening / Reflection utilized  Emotional Support Provided Problem [place] strategies reviewed Made referral to [place] for medication and counseling initial appointment completed Jan.8th  and follow up appointment scheduled Jan. 23rd         If you are experiencing a Mental Health or [place] or need someone to talk to, please call the Suicide and Crisis Lifeline: 988 call the [place] National Suicide Prevention Lifeline: 434-040-4923 or TTY: 774-559-8901 TTY 509-720-8926) to talk to a trained counselor call 1-800-273-TALK (toll free, 24 hour hotline) go to [facility] [address], [place] [phone])   Patient verbalizes understanding of instructions and care plan provided today and agrees to view in [place]. Active MyChart status and patient understanding of how to access instructions and care plan via MyChart confirmed with patient.     No further follow up required: Patient is not eligible for long term management by the Care Coordination team  Casimer Lanius, [place] [phone]

## 2022-10-30 ENCOUNTER — Other Ambulatory Visit: Payer: Self-pay | Admitting: Internal Medicine

## 2022-10-30 DIAGNOSIS — E119 Type 2 diabetes mellitus without complications: Secondary | ICD-10-CM

## 2022-11-10 ENCOUNTER — Encounter: Payer: Self-pay | Admitting: Internal Medicine

## 2022-11-10 ENCOUNTER — Ambulatory Visit (INDEPENDENT_AMBULATORY_CARE_PROVIDER_SITE_OTHER): Payer: 59 | Admitting: Internal Medicine

## 2022-11-10 VITALS — BP 136/84 | HR 68 | Temp 98.2°F | Ht 63.0 in | Wt 270.0 lb

## 2022-11-10 DIAGNOSIS — R300 Dysuria: Secondary | ICD-10-CM | POA: Diagnosis not present

## 2022-11-10 DIAGNOSIS — E119 Type 2 diabetes mellitus without complications: Secondary | ICD-10-CM | POA: Diagnosis not present

## 2022-11-10 DIAGNOSIS — E538 Deficiency of other specified B group vitamins: Secondary | ICD-10-CM

## 2022-11-10 DIAGNOSIS — Z794 Long term (current) use of insulin: Secondary | ICD-10-CM

## 2022-11-10 DIAGNOSIS — I10 Essential (primary) hypertension: Secondary | ICD-10-CM | POA: Diagnosis not present

## 2022-11-10 DIAGNOSIS — R159 Full incontinence of feces: Secondary | ICD-10-CM

## 2022-11-10 DIAGNOSIS — N3000 Acute cystitis without hematuria: Secondary | ICD-10-CM | POA: Diagnosis not present

## 2022-11-10 DIAGNOSIS — N1831 Chronic kidney disease, stage 3a: Secondary | ICD-10-CM

## 2022-11-10 LAB — HEMOGLOBIN A1C: Hgb A1c MFr Bld: 5.4 % (ref 4.6–6.5)

## 2022-11-10 LAB — CBC WITH DIFFERENTIAL/PLATELET
Basophils Absolute: 0 10*3/uL (ref 0.0–0.1)
Basophils Relative: 0.4 % (ref 0.0–3.0)
Eosinophils Absolute: 0.1 10*3/uL (ref 0.0–0.7)
Eosinophils Relative: 1.2 % (ref 0.0–5.0)
HCT: 32.9 % — ABNORMAL LOW (ref 36.0–46.0)
Hemoglobin: 10.9 g/dL — ABNORMAL LOW (ref 12.0–15.0)
Lymphocytes Relative: 44.3 % (ref 12.0–46.0)
Lymphs Abs: 2.7 10*3/uL (ref 0.7–4.0)
MCHC: 33.2 g/dL (ref 30.0–36.0)
MCV: 92.7 fl (ref 78.0–100.0)
Monocytes Absolute: 0.4 10*3/uL (ref 0.1–1.0)
Monocytes Relative: 6.8 % (ref 3.0–12.0)
Neutro Abs: 2.9 10*3/uL (ref 1.4–7.7)
Neutrophils Relative %: 47.3 % (ref 43.0–77.0)
Platelets: 194 10*3/uL (ref 150.0–400.0)
RBC: 3.55 Mil/uL — ABNORMAL LOW (ref 3.87–5.11)
RDW: 14.1 % (ref 11.5–15.5)
WBC: 6.1 10*3/uL (ref 4.0–10.5)

## 2022-11-10 LAB — BASIC METABOLIC PANEL
BUN: 26 mg/dL — ABNORMAL HIGH (ref 6–23)
CO2: 25 mEq/L (ref 19–32)
Calcium: 9.8 mg/dL (ref 8.4–10.5)
Chloride: 103 mEq/L (ref 96–112)
Creatinine, Ser: 1.18 mg/dL (ref 0.40–1.20)
GFR: 46.83 mL/min — ABNORMAL LOW (ref >60.00)
Glucose, Bld: 72 mg/dL (ref 70–99)
Potassium: 4.1 mEq/L (ref 3.5–5.1)
Sodium: 138 mEq/L (ref 135–145)

## 2022-11-10 LAB — MICROALBUMIN / CREATININE URINE RATIO
Creatinine,U: 183.8 mg/dL
Microalb Creat Ratio: 2.5 mg/g (ref 0.0–30.0)
Microalb, Ur: 4.6 mg/dL — ABNORMAL HIGH (ref 0.0–1.9)

## 2022-11-10 LAB — FOLATE: Folate: 19.5 ng/mL (ref >5.9)

## 2022-11-10 MED ORDER — CYANOCOBALAMIN 1000 MCG/ML IJ SOLN
1000.0000 ug | Freq: Once | INTRAMUSCULAR | Status: AC
  Administered 2022-11-10: 1000 ug via INTRAMUSCULAR

## 2022-11-10 NOTE — Patient Instructions (Signed)

## 2022-11-10 NOTE — Progress Notes (Signed)
Subjective:  Patient ID: Stephanie [name], female    DOB: August 25, 1951  Age: 71 y.o. MRN: [id number]  CC: Anemia, Diabetes, and Urinary Tract Infection   HPI Stephanie [name] presents for f/up -  She complains of a 10-day history of dysuria and cloudy urine.  She denies nausea, vomiting, fever, chills, hematuria, abdominal pain, or flank pain.  Outpatient Medications Prior to Visit  Medication Sig Dispense Refill   Accu-Chek FastClix Lancets MISC      allopurinol (ZYLOPRIM) 100 MG tablet TAKE 1/2 TABLET(50 MG) BY MOUTH TWICE DAILY 90 tablet 3   ALPRAZolam (XANAX) 0.5 MG tablet Take 1 tablet (0.5 mg total) by mouth 3 (three) times daily as needed for anxiety. (Patient taking differently: Take 0.5 mg by mouth at bedtime as needed for sleep.) 270 tablet 0   BD PEN NEEDLE NANO 2ND GEN 32G X 4 MM MISC USE EVERY DAY AS DIRECTED 100 each 1   Blood Pressure KIT 1 kit by Does not apply route daily. Check blood pressure once a day 1 kit 0   colchicine 0.6 MG tablet Take 0.6 mg by mouth daily as needed (gout).     Continuous Blood Gluc Receiver (DEXCOM G7 RECEIVER) DEVI 1 Act by Does not apply route daily. 9 each 1   Continuous Blood Gluc Sensor (DEXCOM G7 SENSOR) MISC 1 Act by Does not apply route daily. 9 each 1   dronedarone (MULTAQ) 400 MG tablet Take 1 tablet (400 mg total) by mouth 2 (two) times daily. 180 tablet 2   furosemide (LASIX) 40 MG tablet Take 1 tablet (40 mg total) by mouth daily. 90 tablet 1   hydrOXYzine (ATARAX) 10 MG tablet Take 1 tablet (10 mg total) by mouth every 8 (eight) hours as needed. 270 tablet 0   Insulin Glargine (BASAGLAR KWIKPEN) 100 UNIT/ML Inject 30 Units into the skin daily. 27 mL 1   Insulin Pen Needle (NOVOFINE) 30G X 8 MM MISC For Humalog meal time TID 100 each 11   JARDIANCE 25 MG TABS tablet TAKE 1 TABLET(25 MG) BY MOUTH DAILY 90 tablet 1   metoprolol succinate (TOPROL-XL) 25 MG 24 hr tablet TAKE 1 TABLET(25 MG) BY MOUTH DAILY 90 tablet 1   NOVOLOG FLEXPEN 100  UNIT/ML FlexPen Inject 5 Units into the skin 3 (three) times daily with meals. 15 mL 0   potassium chloride SA (KLOR-CON M15) 15 MEQ tablet Take 1 tablet (15 mEq total) by mouth 3 (three) times daily. 270 tablet 0   sacubitril-valsartan (ENTRESTO) 24-26 MG Take 1 tablet by mouth 2 (two) times daily. 180 tablet 2   simvastatin (ZOCOR) 10 MG tablet Take 1 tablet (10 mg total) by mouth daily. 90 tablet 1   dabigatran (PRADAXA) 150 MG CAPS capsule TAKE 1 CAPSULE BY MOUTH TWICE DAILY( EVERY TWELVE HOURS) (Patient taking differently: Take 150 mg by mouth 2 (two) times daily.) 60 capsule 5   No facility-administered medications prior to visit.    ROS Review of Systems  Constitutional:  Positive for fatigue. Negative for chills, diaphoresis, fever and unexpected weight change.  HENT: Negative.    Eyes: Negative.   Respiratory:  Negative for chest tightness, shortness of breath and wheezing.   Cardiovascular:  Negative for chest pain, palpitations and leg swelling.  Gastrointestinal:  Negative for abdominal pain, diarrhea, nausea and vomiting.       Incontinent of stool  Endocrine: Negative.   Genitourinary:  Positive for dysuria, frequency and urgency. Negative for difficulty urinating, enuresis, flank  pain and hematuria.  Musculoskeletal: Negative.  Negative for arthralgias.  Skin: Negative.   Neurological: Negative.   Hematological:  Negative for adenopathy. Does not bruise/bleed easily.  Psychiatric/Behavioral: Negative.      Objective:  BP 136/84 (BP Location: Right Arm, Patient Position: Sitting, Cuff Size: Large)   Pulse 68   Temp 98.2 F (36.8 C) (Oral)   Ht '5\' 3"'$  (1.6 m)   Wt 270 lb (122.5 kg)   SpO2 94%   BMI 47.83 kg/m   BP Readings from Last 3 Encounters:  11/10/22 136/84  07/02/22 136/84  06/21/22 (!) 155/92    Wt Readings from Last 3 Encounters:  11/10/22 270 lb (122.5 kg)  07/02/22 264 lb (119.7 kg)  06/21/22 258 lb (117 kg)    Physical Exam Vitals reviewed.   HENT:     Mouth/Throat:     Mouth: Mucous membranes are moist.  Eyes:     General: No scleral icterus.    Conjunctiva/sclera: Conjunctivae normal.  Cardiovascular:     Rate and Rhythm: Normal rate and regular rhythm.     Heart sounds: No murmur heard. Pulmonary:     Effort: Pulmonary effort is normal.     Breath sounds: No stridor. No wheezing, rhonchi or rales.  Abdominal:     General: Abdomen is flat.     Palpations: There is no mass.     Tenderness: There is no abdominal tenderness. There is no guarding.     Hernia: No hernia is present.  Musculoskeletal:        General: Normal range of motion.     Cervical back: Neck supple.     Right lower leg: No edema.     Left lower leg: No edema.  Lymphadenopathy:     Cervical: No cervical adenopathy.  Skin:    General: Skin is warm and dry.  Neurological:     General: No focal deficit present.     Mental Status: She is alert. Mental status is at baseline.  Psychiatric:        Mood and Affect: Mood normal.        Behavior: Behavior normal.     Lab Results  Component Value Date   WBC 6.1 11/10/2022   HGB 10.9 (L) 11/10/2022   HCT 32.9 (L) 11/10/2022   PLT 194.0 11/10/2022   GLUCOSE 72 11/10/2022   CHOL 179 02/12/2022   TRIG 129 02/12/2022   HDL 55 02/12/2022   LDLCALC 101 02/12/2022   ALT 8 06/20/2022   AST 11 (L) 06/20/2022   NA 138 11/10/2022   K 4.1 11/10/2022   CL 103 11/10/2022   CREATININE 1.18 11/10/2022   BUN 26 (H) 11/10/2022   CO2 25 11/10/2022   TSH 2.78 03/18/2022   INR 1.2 (H) 04/05/2013   HGBA1C 5.4 11/10/2022   MICROALBUR 4.6 (H) 11/10/2022    CT ABDOMEN PELVIS W CONTRAST  Result Date: 06/19/2022 CLINICAL DATA:  Abdominal pain for 2 days EXAM: CT ABDOMEN AND PELVIS WITH CONTRAST TECHNIQUE: Multidetector CT imaging of the abdomen and pelvis was performed using the standard protocol following bolus administration of intravenous contrast. RADIATION DOSE REDUCTION: This exam was performed according to  the departmental dose-optimization program which includes automated exposure control, adjustment of the mA and/or kV according to patient size and/or use of iterative reconstruction technique. CONTRAST:  70m OMNIPAQUE IOHEXOL 300 MG/ML  SOLN COMPARISON:  01/26/2022 FINDINGS: Lower chest: No acute abnormality. Hepatobiliary: No focal liver abnormality is seen. Status post cholecystectomy.  No biliary dilatation. Pancreas: Unremarkable. No pancreatic ductal dilatation or surrounding inflammatory changes. Spleen: Normal in size without focal abnormality. Adrenals/Urinary Tract: Adrenal glands are within normal limits. Kidneys demonstrate a normal enhancement pattern. No renal calculi or obstructive changes are seen. Bladder is partially distended. Stomach/Bowel: Diverticular change is seen. Some surrounding inflammatory changes are noted at the junction of the rectum and sigmoid consistent with diverticulitis. Few small foci of air are noted which likely represent micro perforations. No abscess is seen. The stomach is within normal limits. Small bowel is unremarkable. The appendix is within normal limits. Stomach is within normal limits. Vascular/Lymphatic: No significant vascular findings are present. No enlarged abdominal or pelvic lymph nodes. Reproductive: Status post hysterectomy. No adnexal masses. Other: No free fluid is noted. Musculoskeletal: Degenerative changes of lumbar spine are noted. No rib abnormality is seen. IMPRESSION: Diverticulitis similar to that noted on the prior exam. A few small extraluminal foci of air are noted consistent with micro perforations. No abscess is noted. Postsurgical changes. Electronically Signed   By: Inez Catalina M.D.   On: 06/19/2022 03:07    Assessment & Plan:   Mona was seen today for anemia, diabetes and urinary tract infection.  Diagnoses and all orders for this visit:  Essential hypertension- Her blood pressure is well-controlled. -     CBC with  Differential/Platelet; Future -     Basic metabolic panel; Future -     Urinalysis, Routine w reflex microscopic; Future -     Urinalysis, Routine w reflex microscopic -     Basic metabolic panel -     CBC with Differential/Platelet  Insulin-requiring or dependent type II diabetes mellitus ([place])- Her blood sugar is well-controlled. -     Basic metabolic panel; Future -     Microalbumin / creatinine urine ratio; Future -     Hemoglobin A1c; Future -     Hemoglobin A1c -     Microalbumin / creatinine urine ratio -     Basic metabolic panel  P95 deficiency -     CBC with Differential/Platelet; Future -     Folate; Future -     cyanocobalamin (VITAMIN B12) injection 1,000 mcg -     Folate -     CBC with Differential/Platelet  Dysuria -     CULTURE, URINE COMPREHENSIVE; Future -     CULTURE, URINE COMPREHENSIVE  Full incontinence of feces -     Ambulatory referral to Gastroenterology  Chronic kidney disease (CKD) stage G3a/A3, moderately decreased glomerular filtration rate (GFR) between 45-59 mL/min/1.73 square meter and albuminuria creatinine ratio greater than 300 mg/g ([place])- Her renal function is stable.  Acute cystitis without hematuria- Urine culture is positive for E. coli.  Sensitive to nitrofurantoin. -     nitrofurantoin, macrocrystal-monohydrate, (MACROBID) 100 MG capsule; Take 1 capsule (100 mg total) by mouth 2 (two) times daily for 5 days.   I am having Stephanie [name] start on nitrofurantoin (macrocrystal-monohydrate). I am also having her maintain her Insulin Pen Needle, NovoLOG FlexPen, Accu-Chek FastClix Lancets, Blood Pressure, colchicine, ALPRAZolam, hydrOXYzine, simvastatin, Basaglar KwikPen, Dexcom G7 Sensor, Dexcom G7 Receiver, potassium chloride SA, Entresto, Multaq, furosemide, BD Pen Needle Nano 2nd Gen, metoprolol succinate, allopurinol, and Jardiance. We administered cyanocobalamin.  Meds ordered this encounter  Medications   cyanocobalamin (VITAMIN B12)  injection 1,000 mcg   nitrofurantoin, macrocrystal-monohydrate, (MACROBID) 100 MG capsule    Sig: Take 1 capsule (100 mg total) by mouth 2 (two) times daily for 5 days.  Dispense:  10 capsule    Refill:  0     Follow-up: Return in about 3 months (around 02/09/2023).  Scarlette Calico, MD

## 2022-11-11 ENCOUNTER — Other Ambulatory Visit: Payer: Self-pay | Admitting: Internal Medicine

## 2022-11-11 DIAGNOSIS — N3000 Acute cystitis without hematuria: Secondary | ICD-10-CM | POA: Insufficient documentation

## 2022-11-11 DIAGNOSIS — I4819 Other persistent atrial fibrillation: Secondary | ICD-10-CM

## 2022-11-11 DIAGNOSIS — N1831 Chronic kidney disease, stage 3a: Secondary | ICD-10-CM | POA: Insufficient documentation

## 2022-11-11 LAB — URINALYSIS, ROUTINE W REFLEX MICROSCOPIC
Bilirubin Urine: NEGATIVE
Hgb urine dipstick: NEGATIVE
Ketones, ur: 15 — AB
Nitrite: POSITIVE — AB
RBC / HPF: NONE SEEN (ref 0–?)
Specific Gravity, Urine: >=1.03 — AB (ref 1.000–1.030)
Total Protein, Urine: NEGATIVE
Urine Glucose: >=1000 — AB
Urobilinogen, UA: 0.2 (ref 0.0–1.0)
pH: 5.5 (ref 5.0–8.0)

## 2022-11-11 MED ORDER — NITROFURANTOIN MONOHYD MACRO 100 MG PO CAPS: 100.0000 mg | ORAL_CAPSULE | Freq: Two times a day (BID) | ORAL | 0 refills | Status: AC

## 2022-11-12 LAB — CULTURE, URINE COMPREHENSIVE

## 2023-01-08 NOTE — Progress Notes (Unsigned)
Benito Mccreedy D.[facility] [place] Phone: [phone]   Assessment and Plan:     There are no diagnoses linked to this encounter.  ***   Pertinent previous records reviewed include ***   Follow Up: ***     Subjective:   I, Sriram Febles, am serving as a Education administrator for Doctor Glennon Mac  Chief Complaint: bilateral knee pain   HPI:  09/10/2021 Stephanie [name] is a 72 y.o. female who presents to [place] at [facility] today for f/u of B knee pain and for potential knee injections.  She was last seen by Dr. Georgina Snell on 08/21/21 and had B knee X-rays.  Due to being needle phobic, pt did not want to proceed w/ injections at that time.  Today, pt reports that her B knee pain is about the same and would like to try the injections.   Diagnostic testing: R and L knee XR- 08/21/21   Pertinent review of systems: No fevers or chills   Relevant historical information: Knee DJD.  Diabetes.  Last A1c 6.1 October 25. Needle phobia  01/11/2023 Patient states  Relevant Historical Information: ***  Additional pertinent review of systems negative.   Current Outpatient Medications:    Accu-Chek FastClix Lancets MISC, , Disp: , Rfl:    allopurinol (ZYLOPRIM) 100 MG tablet, TAKE 1/2 TABLET(50 MG) BY MOUTH TWICE DAILY, Disp: 90 tablet, Rfl: 3   ALPRAZolam (XANAX) 0.5 MG tablet, Take 1 tablet (0.5 mg total) by mouth 3 (three) times daily as needed for anxiety. (Patient taking differently: Take 0.5 mg by mouth at bedtime as needed for sleep.), Disp: 270 tablet, Rfl: 0   BD PEN NEEDLE NANO 2ND GEN 32G X 4 MM MISC, USE EVERY DAY AS DIRECTED, Disp: 100 each, Rfl: 1   Blood Pressure KIT, 1 kit by Does not apply route daily. Check blood pressure once a day, Disp: 1 kit, Rfl: 0   colchicine 0.6 MG tablet, Take 0.6 mg by mouth daily as needed (gout)., Disp: , Rfl:    Continuous Blood Gluc Receiver ([place])  [place], 1 Act by Does not apply route daily., Disp: 9 each, Rfl: 1   Continuous Blood Gluc Sensor (DEXCOM G7 SENSOR) MISC, 1 Act by Does not apply route daily., Disp: 9 each, Rfl: 1   dabigatran (PRADAXA) 150 MG CAPS capsule, TAKE 1 CAPSULE BY MOUTH EVERY 12 HOURS, Disp: 60 capsule, Rfl: 5   dronedarone (MULTAQ) 400 MG tablet, Take 1 tablet (400 mg total) by mouth 2 (two) times daily., Disp: 180 tablet, Rfl: 2   furosemide (LASIX) 40 MG tablet, Take 1 tablet (40 mg total) by mouth daily., Disp: 90 tablet, Rfl: 1   hydrOXYzine (ATARAX) 10 MG tablet, Take 1 tablet (10 mg total) by mouth every 8 (eight) hours as needed., Disp: 270 tablet, Rfl: 0   Insulin Glargine (BASAGLAR KWIKPEN) 100 UNIT/ML, Inject 30 Units into the skin daily., Disp: 27 mL, Rfl: 1   Insulin Pen Needle (NOVOFINE) 30G X 8 MM MISC, For Humalog meal time TID, Disp: 100 each, Rfl: 11   JARDIANCE 25 MG TABS tablet, TAKE 1 TABLET(25 MG) BY MOUTH DAILY, Disp: 90 tablet, Rfl: 1   metoprolol succinate (TOPROL-XL) 25 MG 24 hr tablet, TAKE 1 TABLET(25 MG) BY MOUTH DAILY, Disp: 90 tablet, Rfl: 1   NOVOLOG FLEXPEN 100 UNIT/ML FlexPen, Inject 5 Units into the skin 3 (three) times daily with meals., Disp: 15  mL, Rfl: 0   potassium chloride SA (KLOR-CON M15) 15 MEQ tablet, Take 1 tablet (15 mEq total) by mouth 3 (three) times daily., Disp: 270 tablet, Rfl: 0   sacubitril-valsartan (ENTRESTO) 24-26 MG, Take 1 tablet by mouth 2 (two) times daily., Disp: 180 tablet, Rfl: 2   simvastatin (ZOCOR) 10 MG tablet, Take 1 tablet (10 mg total) by mouth daily., Disp: 90 tablet, Rfl: 1   Objective:     There were no vitals filed for this visit.    There is no height or weight on file to calculate BMI.    Physical Exam:    ***   Electronically signed by:  Benito Mccreedy D.[facility] 1:53 PM 01/08/23

## 2023-01-11 ENCOUNTER — Encounter: Payer: Self-pay | Admitting: Internal Medicine

## 2023-01-11 ENCOUNTER — Ambulatory Visit (INDEPENDENT_AMBULATORY_CARE_PROVIDER_SITE_OTHER): Payer: 59 | Admitting: Sports Medicine

## 2023-01-11 ENCOUNTER — Ambulatory Visit (INDEPENDENT_AMBULATORY_CARE_PROVIDER_SITE_OTHER): Payer: 59 | Admitting: Internal Medicine

## 2023-01-11 ENCOUNTER — Ambulatory Visit (INDEPENDENT_AMBULATORY_CARE_PROVIDER_SITE_OTHER): Payer: 59

## 2023-01-11 VITALS — BP 136/58 | HR 90 | Temp 97.8°F | Ht 63.0 in | Wt 274.0 lb

## 2023-01-11 VITALS — BP 110/72 | HR 74 | Ht 63.0 in | Wt 278.0 lb

## 2023-01-11 DIAGNOSIS — M25562 Pain in left knee: Secondary | ICD-10-CM

## 2023-01-11 DIAGNOSIS — G8929 Other chronic pain: Secondary | ICD-10-CM

## 2023-01-11 DIAGNOSIS — I428 Other cardiomyopathies: Secondary | ICD-10-CM

## 2023-01-11 DIAGNOSIS — I5032 Chronic diastolic (congestive) heart failure: Secondary | ICD-10-CM | POA: Diagnosis not present

## 2023-01-11 DIAGNOSIS — E118 Type 2 diabetes mellitus with unspecified complications: Secondary | ICD-10-CM | POA: Diagnosis not present

## 2023-01-11 DIAGNOSIS — I10 Essential (primary) hypertension: Secondary | ICD-10-CM | POA: Diagnosis not present

## 2023-01-11 DIAGNOSIS — M1712 Unilateral primary osteoarthritis, left knee: Secondary | ICD-10-CM | POA: Diagnosis not present

## 2023-01-11 DIAGNOSIS — M25561 Pain in right knee: Secondary | ICD-10-CM | POA: Diagnosis not present

## 2023-01-11 DIAGNOSIS — M170 Bilateral primary osteoarthritis of knee: Secondary | ICD-10-CM

## 2023-01-11 DIAGNOSIS — E785 Hyperlipidemia, unspecified: Secondary | ICD-10-CM | POA: Diagnosis not present

## 2023-01-11 DIAGNOSIS — E538 Deficiency of other specified B group vitamins: Secondary | ICD-10-CM | POA: Diagnosis not present

## 2023-01-11 MED ORDER — MELOXICAM 15 MG PO TABS: 15.0000 mg | ORAL_TABLET | Freq: Every day | ORAL | 0 refills | Status: DC

## 2023-01-11 MED ORDER — TIRZEPATIDE 2.5 MG/0.5ML ~~LOC~~ SOAJ: 2.5000 mg | SUBCUTANEOUS | 0 refills | Status: DC

## 2023-01-11 MED ORDER — SIMVASTATIN 10 MG PO TABS: 10.0000 mg | ORAL_TABLET | Freq: Every day | ORAL | 1 refills | Status: DC

## 2023-01-11 MED ORDER — CYANOCOBALAMIN 1000 MCG/ML IJ SOLN
1000.0000 ug | Freq: Once | INTRAMUSCULAR | Status: AC
  Administered 2023-01-11: 1000 ug via INTRAMUSCULAR

## 2023-01-11 MED ORDER — FUROSEMIDE 40 MG PO TABS: 40.0000 mg | ORAL_TABLET | Freq: Every day | ORAL | 1 refills | Status: DC

## 2023-01-11 NOTE — Progress Notes (Unsigned)
Subjective:  Patient ID: Stephanie [name], female    DOB: [birth year]  Age: 72 y.o. MRN: [id number]  CC: Hypertension, Diabetes, and Congestive Heart Failure   HPI Stephanie [name] presents for f/up --  She complains of weight gain but denies LE edema.  She has her baseline dyspnea on exertion but denies chest pain, diaphoresis, or palpitations.  Outpatient Medications Prior to Visit  Medication Sig Dispense Refill   Accu-Chek FastClix Lancets MISC      allopurinol (ZYLOPRIM) 100 MG tablet TAKE 1/2 TABLET(50 MG) BY MOUTH TWICE DAILY 90 tablet 3   ALPRAZolam (XANAX) 0.5 MG tablet Take 1 tablet (0.5 mg total) by mouth 3 (three) times daily as needed for anxiety. (Patient taking differently: Take 0.5 mg by mouth at bedtime as needed for sleep.) 270 tablet 0   BD PEN NEEDLE NANO 2ND GEN 32G X 4 MM MISC USE EVERY DAY AS DIRECTED 100 each 1   Blood Pressure KIT 1 kit by Does not apply route daily. Check blood pressure once a day 1 kit 0   colchicine 0.6 MG tablet Take 0.6 mg by mouth daily as needed (gout).     Continuous Blood Gluc Receiver (DEXCOM G7 RECEIVER) DEVI 1 Act by Does not apply route daily. 9 each 1   Continuous Blood Gluc Sensor (DEXCOM G7 SENSOR) MISC 1 Act by Does not apply route daily. 9 each 1   dabigatran (PRADAXA) 150 MG CAPS capsule TAKE 1 CAPSULE BY MOUTH EVERY 12 HOURS 60 capsule 5   dronedarone (MULTAQ) 400 MG tablet Take 1 tablet (400 mg total) by mouth 2 (two) times daily. 180 tablet 2   hydrOXYzine (ATARAX) 10 MG tablet Take 1 tablet (10 mg total) by mouth every 8 (eight) hours as needed. 270 tablet 0   Insulin Pen Needle (NOVOFINE) 30G X 8 MM MISC For Humalog meal time TID 100 each 11   JARDIANCE 25 MG TABS tablet TAKE 1 TABLET(25 MG) BY MOUTH DAILY 90 tablet 1   meloxicam (MOBIC) 15 MG tablet Take 1 tablet (15 mg total) by mouth daily. 30 tablet 0   metoprolol succinate (TOPROL-XL) 25 MG 24 hr tablet TAKE 1 TABLET(25 MG) BY MOUTH DAILY 90 tablet 1   potassium  chloride SA (KLOR-CON M15) 15 MEQ tablet Take 1 tablet (15 mEq total) by mouth 3 (three) times daily. 270 tablet 0   sacubitril-valsartan (ENTRESTO) 24-26 MG Take 1 tablet by mouth 2 (two) times daily. 180 tablet 2   furosemide (LASIX) 40 MG tablet Take 1 tablet (40 mg total) by mouth daily. 90 tablet 1   Insulin Glargine (BASAGLAR KWIKPEN) 100 UNIT/ML Inject 30 Units into the skin daily. 27 mL 1   NOVOLOG FLEXPEN 100 UNIT/ML FlexPen Inject 5 Units into the skin 3 (three) times daily with meals. 15 mL 0   simvastatin (ZOCOR) 10 MG tablet Take 1 tablet (10 mg total) by mouth daily. 90 tablet 1   No facility-administered medications prior to visit.    ROS Review of Systems  Constitutional:  Positive for unexpected weight change (wt gain). Negative for chills, diaphoresis and fatigue.  HENT: Negative.    Respiratory:  Positive for shortness of breath. Negative for cough, chest tightness and wheezing.   Cardiovascular:  Negative for chest pain, palpitations and leg swelling.  Gastrointestinal:  Negative for abdominal pain, diarrhea, nausea and vomiting.  Genitourinary: Negative.  Negative for difficulty urinating.  Musculoskeletal:  Positive for arthralgias. Negative for back pain and myalgias.  Skin: Negative.  Neurological:  Negative for dizziness, weakness and headaches.  Hematological:  Negative for adenopathy. Does not bruise/bleed easily.  Psychiatric/Behavioral: Negative.      Objective:  BP (!) 136/58 (BP Location: Left Arm, Patient Position: Sitting, Cuff Size: Large)   Pulse 90   Temp 97.8 F (36.6 C) (Oral)   Ht 5\' 3"  (1.6 m)   Wt 274 lb (124.3 kg)   SpO2 99%   BMI 48.54 kg/m   BP Readings from Last 3 Encounters:  01/11/23 (!) 136/58  01/11/23 110/72  11/10/22 136/84    Wt Readings from Last 3 Encounters:  01/11/23 274 lb (124.3 kg)  01/11/23 278 lb (126.1 kg)  11/10/22 270 lb (122.5 kg)    Physical Exam Vitals reviewed.  Constitutional:      Appearance:  Normal appearance.  HENT:     Nose: Nose normal.     Mouth/Throat:     Mouth: Mucous membranes are moist.  Eyes:     General: No scleral icterus.    Conjunctiva/sclera: Conjunctivae normal.  Cardiovascular:     Rate and Rhythm: Normal rate and regular rhythm.     Heart sounds: No murmur heard. Pulmonary:     Effort: Pulmonary effort is normal.     Breath sounds: No stridor. No wheezing, rhonchi or rales.  Abdominal:     General: Abdomen is protuberant. There is no distension.     Palpations: There is no mass.     Tenderness: There is no abdominal tenderness. There is no guarding.     Hernia: No hernia is present.  Musculoskeletal:        General: Normal range of motion.     Cervical back: Neck supple.     Right lower leg: No edema.     Left lower leg: No edema.  Lymphadenopathy:     Cervical: No cervical adenopathy.  Skin:    General: Skin is warm and dry.  Neurological:     General: No focal deficit present.     Mental Status: She is alert. Mental status is at baseline.     Lab Results  Component Value Date   WBC 6.1 11/10/2022   HGB 10.9 (L) 11/10/2022   HCT 32.9 (L) 11/10/2022   PLT 194.0 11/10/2022   GLUCOSE 72 11/10/2022   CHOL 179 02/12/2022   TRIG 129 02/12/2022   HDL 55 02/12/2022   LDLCALC 101 02/12/2022   ALT 8 06/20/2022   AST 11 (L) 06/20/2022   NA 138 11/10/2022   K 4.1 11/10/2022   CL 103 11/10/2022   CREATININE 1.18 11/10/2022   BUN 26 (H) 11/10/2022   CO2 25 11/10/2022   TSH 2.78 03/18/2022   INR 1.2 (H) 04/05/2013   HGBA1C 5.4 11/10/2022   MICROALBUR 4.6 (H) 11/10/2022    CT ABDOMEN PELVIS W CONTRAST  Result Date: 06/19/2022 CLINICAL DATA:  Abdominal pain for 2 days EXAM: CT ABDOMEN AND PELVIS WITH CONTRAST TECHNIQUE: Multidetector CT imaging of the abdomen and pelvis was performed using the standard protocol following bolus administration of intravenous contrast. RADIATION DOSE REDUCTION: This exam was performed according to the  departmental dose-optimization program which includes automated exposure control, adjustment of the mA and/or kV according to patient size and/or use of iterative reconstruction technique. CONTRAST:  61mL OMNIPAQUE IOHEXOL 300 MG/ML  SOLN COMPARISON:  01/26/2022 FINDINGS: Lower chest: No acute abnormality. Hepatobiliary: No focal liver abnormality is seen. Status post cholecystectomy. No biliary dilatation. Pancreas: Unremarkable. No pancreatic ductal dilatation or surrounding inflammatory  changes. Spleen: Normal in size without focal abnormality. Adrenals/Urinary Tract: Adrenal glands are within normal limits. Kidneys demonstrate a normal enhancement pattern. No renal calculi or obstructive changes are seen. Bladder is partially distended. Stomach/Bowel: Diverticular change is seen. Some surrounding inflammatory changes are noted at the junction of the rectum and sigmoid consistent with diverticulitis. Few small foci of air are noted which likely represent micro perforations. No abscess is seen. The stomach is within normal limits. Small bowel is unremarkable. The appendix is within normal limits. Stomach is within normal limits. Vascular/Lymphatic: No significant vascular findings are present. No enlarged abdominal or pelvic lymph nodes. Reproductive: Status post hysterectomy. No adnexal masses. Other: No free fluid is noted. Musculoskeletal: Degenerative changes of lumbar spine are noted. No rib abnormality is seen. IMPRESSION: Diverticulitis similar to that noted on the prior exam. A few small extraluminal foci of air are noted consistent with micro perforations. No abscess is noted. Postsurgical changes. Electronically Signed   By: Inez Catalina M.D.   On: 06/19/2022 03:07    Assessment & Plan:  B12 deficiency- I have asked her to be more compliant with parenteral B12 replacement therapy. -     Cyanocobalamin  Chronic diastolic heart failure ([place])- She has a normal volume status. -     Furosemide; Take 1  tablet (40 mg total) by mouth daily.  Dispense: 90 tablet; Refill: 1  NICM (nonischemic cardiomyopathy) (HCC) -     Furosemide; Take 1 tablet (40 mg total) by mouth daily.  Dispense: 90 tablet; Refill: 1  Essential hypertension- Her BP is well controlled. -     Furosemide; Take 1 tablet (40 mg total) by mouth daily.  Dispense: 90 tablet; Refill: 1  Type II diabetes mellitus with complication ([place]) -     Tirzepatide; Inject 2.5 mg into the skin once a week.  Dispense: 2 mL; Refill: 0  Dyslipidemia, goal LDL below 100- LDL goal achieved. Doing well on the statin  -     Simvastatin; Take 1 tablet (10 mg total) by mouth daily.  Dispense: 90 tablet; Refill: 1     Follow-up: Return in about 4 months (around 05/13/2023).  Scarlette Calico, MD

## 2023-01-11 NOTE — Patient Instructions (Signed)

## 2023-01-11 NOTE — Patient Instructions (Signed)
-   Start meloxicam 15 mg daily x2 weeks.  If still having pain after 2 weeks, complete 3rd-week of meloxicam. May use remaining meloxicam as needed once daily for pain control.  Do not to use additional NSAIDs while taking meloxicam.  May use Tylenol [phone] mg 2 to 3 times a day for breakthrough pain. Knee HEP 4 week follow up

## 2023-01-12 DIAGNOSIS — Z794 Long term (current) use of insulin: Secondary | ICD-10-CM | POA: Diagnosis not present

## 2023-01-12 DIAGNOSIS — E1165 Type 2 diabetes mellitus with hyperglycemia: Secondary | ICD-10-CM | POA: Diagnosis not present

## 2023-01-12 DIAGNOSIS — I10 Essential (primary) hypertension: Secondary | ICD-10-CM | POA: Diagnosis not present

## 2023-02-09 ENCOUNTER — Telehealth: Payer: Self-pay | Admitting: Internal Medicine

## 2023-02-09 ENCOUNTER — Other Ambulatory Visit: Payer: Self-pay | Admitting: Internal Medicine

## 2023-02-09 DIAGNOSIS — E118 Type 2 diabetes mellitus with unspecified complications: Secondary | ICD-10-CM

## 2023-02-09 DIAGNOSIS — I10 Essential (primary) hypertension: Secondary | ICD-10-CM

## 2023-02-09 DIAGNOSIS — E876 Hypokalemia: Secondary | ICD-10-CM

## 2023-02-09 MED ORDER — POTASSIUM CHLORIDE CRYS ER 15 MEQ PO TBCR: 15.0000 meq | EXTENDED_RELEASE_TABLET | Freq: Three times a day (TID) | ORAL | 0 refills | Status: DC

## 2023-02-09 MED ORDER — TIRZEPATIDE 5 MG/0.5ML ~~LOC~~ SOAJ: 5.0000 mg | SUBCUTANEOUS | 0 refills | Status: DC

## 2023-02-09 NOTE — Telephone Encounter (Signed)
Prescription Request  02/09/2023  LOV: 01/11/2023  What is the name of the medication or equipment?   tirzepatide [facility]) 2.5 MG/0.5ML Pen  potassium chloride SA (KLOR-CON M15) 15 MEQ tablet  Have you contacted your pharmacy to request a refill? No   Which pharmacy would you like this sent to?  [id number] [place], [place] - [phone] AT [facility] OF [facility] & [address] [place] [address] Phone: [phone] Fax: [phone]    Patient notified that their request is being sent to the clinical staff for review and that they should receive a response within 2 business days.   Please advise at Mobile [phone] (mobile)    Patient said she is out of [facility] and is due to take it today 02/09/23.

## 2023-02-09 NOTE — Progress Notes (Unsigned)
Aleen [facility]ells [facility] [address] [place] [address] Phone: [phone]   Assessment and Plan:     There are no diagnoses linked to this encounter.  ***   Pertinent previous records reviewed include ***   Follow Up: ***     Subjective:   I, Klay Sobotka, am serving as a Neurosurgeon for Doctor Richardean Sale   Chief Complaint: bilateral knee pain    HPI:  09/10/2021 Jaedin Trumbo is a 72 y.o. female who presents to [facility] at [facility] today for f/u of B knee pain and for potential knee injections.  She was last seen by Dr. Denyse Amass on 08/21/21 and had B knee X-rays.  Due to being needle phobic, pt did not want to proceed w/ injections at that time.     Pain started to worsen one month ago. Pain in R leg radiates from lateral aspect of knee down her lower leg. The L knee pain is located medially and seems to occur constantly. Using Advil prn. Has tried to walk for exercise but this causes her to be unable to leave her house for a few days due to pain.    Diagnostic testing: R and L knee XR- 08/21/21   Pertinent review of systems: No fevers or chills   Relevant historical information: Knee DJD.  Diabetes.  Last A1c 6.1 October 25. Needle phobia   01/11/2023 Patient states  02/10/2023 Patient states   Relevant Historical Information:  Hypertension, atrial fibrillation, DM type II  Additional pertinent review of systems negative.   Current Outpatient Medications:    Accu-Chek FastClix Lancets MISC, , Disp: , Rfl:    allopurinol (ZYLOPRIM) 100 MG tablet, TAKE 1/2 TABLET(50 MG) BY MOUTH TWICE DAILY, Disp: 90 tablet, Rfl: 3   ALPRAZolam (XANAX) 0.5 MG tablet, Take 1 tablet (0.5 mg total) by mouth 3 (three) times daily as needed for anxiety. (Patient taking differently: Take 0.5 mg by mouth at bedtime as needed for sleep.), Disp: 270 tablet, Rfl: 0   BD PEN NEEDLE NANO 2ND GEN 32G X 4 MM MISC, USE EVERY DAY AS  DIRECTED, Disp: 100 each, Rfl: 1   Blood Pressure KIT, 1 kit by Does not apply route daily. Check blood pressure once a day, Disp: 1 kit, Rfl: 0   colchicine 0.6 MG tablet, Take 0.6 mg by mouth daily as needed (gout)., Disp: , Rfl:    Continuous Blood Gluc Receiver (DEXCOM G7 RECEIVER) DEVI, 1 Act by Does not apply route daily., Disp: 9 each, Rfl: 1   Continuous Blood Gluc Sensor (DEXCOM G7 SENSOR) MISC, 1 Act by Does not apply route daily., Disp: 9 each, Rfl: 1   dabigatran (PRADAXA) 150 MG CAPS capsule, TAKE 1 CAPSULE BY MOUTH EVERY 12 HOURS, Disp: 60 capsule, Rfl: 5   dronedarone (MULTAQ) 400 MG tablet, Take 1 tablet (400 mg total) by mouth 2 (two) times daily., Disp: 180 tablet, Rfl: 2   furosemide (LASIX) 40 MG tablet, Take 1 tablet (40 mg total) by mouth daily., Disp: 90 tablet, Rfl: 1   hydrOXYzine (ATARAX) 10 MG tablet, Take 1 tablet (10 mg total) by mouth every 8 (eight) hours as needed., Disp: 270 tablet, Rfl: 0   Insulin Pen Needle (NOVOFINE) 30G X 8 MM MISC, For Humalog meal time TID, Disp: 100 each, Rfl: 11   JARDIANCE 25 MG TABS tablet, TAKE 1 TABLET(25 MG) BY MOUTH DAILY, Disp: 90 tablet, Rfl: 1   meloxicam (MOBIC)  15 MG tablet, Take 1 tablet (15 mg total) by mouth daily., Disp: 30 tablet, Rfl: 0   metoprolol succinate (TOPROL-XL) 25 MG 24 hr tablet, TAKE 1 TABLET(25 MG) BY MOUTH DAILY, Disp: 90 tablet, Rfl: 1   potassium chloride SA (KLOR-CON M15) 15 MEQ tablet, Take 1 tablet (15 mEq total) by mouth 3 (three) times daily., Disp: 270 tablet, Rfl: 0   sacubitril-valsartan (ENTRESTO) 24-26 MG, Take 1 tablet by mouth 2 (two) times daily., Disp: 180 tablet, Rfl: 2   simvastatin (ZOCOR) 10 MG tablet, Take 1 tablet (10 mg total) by mouth daily., Disp: 90 tablet, Rfl: 1   tirzepatide (MOUNJARO) 5 MG/0.5ML Pen, Inject 5 mg into the skin once a week., Disp: 6 mL, Rfl: 0   Objective:     There were no vitals filed for this visit.    There is no height or weight on file to calculate BMI.     Physical Exam:    ***   Electronically signed by:  Aleen [facility]ells [facility] 4:12 PM 02/09/23

## 2023-02-10 ENCOUNTER — Ambulatory Visit (INDEPENDENT_AMBULATORY_CARE_PROVIDER_SITE_OTHER): Payer: 59 | Admitting: Sports Medicine

## 2023-02-10 VITALS — HR 79 | Ht 63.0 in | Wt 275.0 lb

## 2023-02-10 DIAGNOSIS — M170 Bilateral primary osteoarthritis of knee: Secondary | ICD-10-CM | POA: Diagnosis not present

## 2023-02-10 DIAGNOSIS — M25561 Pain in right knee: Secondary | ICD-10-CM

## 2023-02-10 DIAGNOSIS — G8929 Other chronic pain: Secondary | ICD-10-CM | POA: Diagnosis not present

## 2023-02-10 DIAGNOSIS — M25562 Pain in left knee: Secondary | ICD-10-CM

## 2023-02-10 NOTE — Patient Instructions (Addendum)
Good to see you  Discontinue meloxicam  Stp taking Advil Can continue tumeric Tylenol [phone] mg 2-3 times a day for pain relief  Continue HEP  3-4 week follow up

## 2023-03-01 NOTE — Progress Notes (Deleted)
[facility]leen [facility]ells [facility] [address] [place] [address] Phone: [phone]   Assessment and Plan:     There are no diagnoses linked to this encounter.  ***   Pertinent previous records reviewed include ***   Follow Up: ***     Subjective:   I, Ignacia Gentzler, am serving as a Neurosurgeon for Doctor Richardean Sale   Chief Complaint: bilateral knee pain    HPI:  09/10/2021 Stephanie [name] is a 72 y.o. female who presents to [facility] at [facility] today for f/u of B knee pain and for potential knee injections.  She was last seen by Dr. Denyse Amass on 08/21/21 and had B knee X-rays.  Due to being needle phobic, pt did not want to proceed w/ injections at that time.     Pain started to worsen one month ago. Pain in R leg radiates from lateral aspect of knee down her lower leg. The L knee pain is located medially and seems to occur constantly. Using Advil prn. Has tried to walk for exercise but this causes her to be unable to leave her house for a few days due to pain.    Diagnostic testing: R and L knee XR- 08/21/21   Pertinent review of systems: No fevers or chills   Relevant historical information: Knee DJD.  Diabetes.  Last A1c 6.1 October 25. Needle phobia   01/11/2023 Patient states   02/10/2023 Patient states that she has knee pain , same knee pain as seen before  03/02/2023 Patient states    Relevant Historical Information:  Hypertension, atrial fibrillation, DM type II  Additional pertinent review of systems negative.   Current Outpatient Medications:    Accu-Chek FastClix Lancets MISC, , Disp: , Rfl:    allopurinol (ZYLOPRIM) 100 MG tablet, TAKE 1/2 TABLET(50 MG) BY MOUTH TWICE DAILY, Disp: 90 tablet, Rfl: 3   ALPRAZolam (XANAX) 0.5 MG tablet, Take 1 tablet (0.5 mg total) by mouth 3 (three) times daily as needed for anxiety. (Patient taking differently: Take 0.5 mg by mouth at bedtime as needed for sleep.),  Disp: 270 tablet, Rfl: 0   BD PEN NEEDLE NANO 2ND GEN 32G X 4 MM MISC, USE EVERY DAY AS DIRECTED, Disp: 100 each, Rfl: 1   Blood Pressure KIT, 1 kit by Does not apply route daily. Check blood pressure once a day, Disp: 1 kit, Rfl: 0   colchicine 0.6 MG tablet, Take 0.6 mg by mouth daily as needed (gout)., Disp: , Rfl:    Continuous Blood Gluc Receiver (DEXCOM G7 RECEIVER) DEVI, 1 Act by Does not apply route daily., Disp: 9 each, Rfl: 1   Continuous Blood Gluc Sensor (DEXCOM G7 SENSOR) MISC, 1 Act by Does not apply route daily., Disp: 9 each, Rfl: 1   dabigatran (PRADAXA) 150 MG CAPS capsule, TAKE 1 CAPSULE BY MOUTH EVERY 12 HOURS, Disp: 60 capsule, Rfl: 5   dronedarone (MULTAQ) 400 MG tablet, Take 1 tablet (400 mg total) by mouth 2 (two) times daily., Disp: 180 tablet, Rfl: 2   furosemide (LASIX) 40 MG tablet, Take 1 tablet (40 mg total) by mouth daily., Disp: 90 tablet, Rfl: 1   hydrOXYzine (ATARAX) 10 MG tablet, Take 1 tablet (10 mg total) by mouth every 8 (eight) hours as needed., Disp: 270 tablet, Rfl: 0   Insulin Pen Needle (NOVOFINE) 30G X 8 MM MISC, For Humalog meal time TID, Disp: 100 each, Rfl: 11   JARDIANCE 25 MG  TABS tablet, TAKE 1 TABLET(25 MG) BY MOUTH DAILY, Disp: 90 tablet, Rfl: 1   meloxicam (MOBIC) 15 MG tablet, Take 1 tablet (15 mg total) by mouth daily., Disp: 30 tablet, Rfl: 0   metoprolol succinate (TOPROL-XL) 25 MG 24 hr tablet, TAKE 1 TABLET(25 MG) BY MOUTH DAILY, Disp: 90 tablet, Rfl: 1   potassium chloride SA (KLOR-CON M15) 15 MEQ tablet, Take 1 tablet (15 mEq total) by mouth 3 (three) times daily., Disp: 270 tablet, Rfl: 0   sacubitril-valsartan (ENTRESTO) 24-26 MG, Take 1 tablet by mouth 2 (two) times daily., Disp: 180 tablet, Rfl: 2   simvastatin (ZOCOR) 10 MG tablet, Take 1 tablet (10 mg total) by mouth daily., Disp: 90 tablet, Rfl: 1   tirzepatide (MOUNJARO) 5 MG/0.5ML Pen, Inject 5 mg into the skin once a week., Disp: 6 mL, Rfl: 0   Objective:     There were no  vitals filed for this visit.    There is no height or weight on file to calculate BMI.    Physical Exam:    ***   Electronically signed by:  Aleen [facility]ells [facility] 7:28 AM 03/01/23

## 2023-03-02 ENCOUNTER — Ambulatory Visit: Payer: 59 | Admitting: Sports Medicine

## 2023-03-04 ENCOUNTER — Other Ambulatory Visit: Payer: Self-pay | Admitting: Student

## 2023-03-05 ENCOUNTER — Other Ambulatory Visit: Payer: Self-pay | Admitting: Internal Medicine

## 2023-03-12 ENCOUNTER — Other Ambulatory Visit: Payer: Self-pay | Admitting: Internal Medicine

## 2023-03-12 DIAGNOSIS — E119 Type 2 diabetes mellitus without complications: Secondary | ICD-10-CM

## 2023-03-17 ENCOUNTER — Other Ambulatory Visit: Payer: Self-pay | Admitting: Internal Medicine

## 2023-03-17 DIAGNOSIS — E119 Type 2 diabetes mellitus without complications: Secondary | ICD-10-CM

## 2023-04-05 ENCOUNTER — Telehealth: Payer: Self-pay | Admitting: Internal Medicine

## 2023-04-05 NOTE — Telephone Encounter (Signed)
*  STAT* If patient is at the pharmacy, call can be transferred to refill team.   1. Which medications need to be refilled? (please list name of each medication and dose if known)   dabigatran (PRADAXA) 150 MG CAPS capsule  metoprolol succinate (TOPROL-XL) 25 MG 24 hr tablet   2. Which pharmacy/location (including street and city if local pharmacy) is medication to be sent to?  [id number] - [place], [place] - [address] AT [facility] OF [address]   3. Do they need a 30 day or 90 day supply?   90 day  Patient stated she is completely out of this medication. Patient has appointment scheduled on 7/8.

## 2023-04-11 ENCOUNTER — Other Ambulatory Visit: Payer: Self-pay | Admitting: Internal Medicine

## 2023-04-19 ENCOUNTER — Ambulatory Visit (INDEPENDENT_AMBULATORY_CARE_PROVIDER_SITE_OTHER): Payer: 59 | Admitting: Internal Medicine

## 2023-04-19 ENCOUNTER — Encounter: Payer: Self-pay | Admitting: Internal Medicine

## 2023-04-19 VITALS — BP 138/74 | Temp 97.9°F | Ht 63.0 in | Wt 267.0 lb

## 2023-04-19 DIAGNOSIS — I4819 Other persistent atrial fibrillation: Secondary | ICD-10-CM | POA: Diagnosis not present

## 2023-04-19 DIAGNOSIS — E538 Deficiency of other specified B group vitamins: Secondary | ICD-10-CM | POA: Diagnosis not present

## 2023-04-19 DIAGNOSIS — E785 Hyperlipidemia, unspecified: Secondary | ICD-10-CM | POA: Diagnosis not present

## 2023-04-19 DIAGNOSIS — E876 Hypokalemia: Secondary | ICD-10-CM

## 2023-04-19 DIAGNOSIS — Z1231 Encounter for screening mammogram for malignant neoplasm of breast: Secondary | ICD-10-CM

## 2023-04-19 DIAGNOSIS — F411 Generalized anxiety disorder: Secondary | ICD-10-CM

## 2023-04-19 DIAGNOSIS — N1832 Chronic kidney disease, stage 3b: Secondary | ICD-10-CM

## 2023-04-19 DIAGNOSIS — E118 Type 2 diabetes mellitus with unspecified complications: Secondary | ICD-10-CM | POA: Diagnosis not present

## 2023-04-19 DIAGNOSIS — I10 Essential (primary) hypertension: Secondary | ICD-10-CM

## 2023-04-19 DIAGNOSIS — E1122 Type 2 diabetes mellitus with diabetic chronic kidney disease: Secondary | ICD-10-CM | POA: Diagnosis not present

## 2023-04-19 DIAGNOSIS — N1831 Chronic kidney disease, stage 3a: Secondary | ICD-10-CM

## 2023-04-19 LAB — BASIC METABOLIC PANEL
BUN: 43 mg/dL — ABNORMAL HIGH (ref 6–23)
CO2: 29 mEq/L (ref 19–32)
Calcium: 10 mg/dL (ref 8.4–10.5)
Chloride: 99 mEq/L (ref 96–112)
Creatinine, Ser: 1.87 mg/dL — ABNORMAL HIGH (ref 0.40–1.20)
GFR: 26.87 mL/min — ABNORMAL LOW (ref >60.00)
Glucose, Bld: 128 mg/dL — ABNORMAL HIGH (ref 70–99)
Potassium: 4.2 mEq/L (ref 3.5–5.1)
Sodium: 137 mEq/L (ref 135–145)

## 2023-04-19 LAB — CBC WITH DIFFERENTIAL/PLATELET
Basophils Absolute: 0 10*3/uL (ref 0.0–0.1)
Basophils Relative: 0.6 % (ref 0.0–3.0)
Eosinophils Absolute: 0.1 10*3/uL (ref 0.0–0.7)
Eosinophils Relative: 1.3 % (ref 0.0–5.0)
HCT: 36.6 % (ref 36.0–46.0)
Hemoglobin: 11.9 g/dL — ABNORMAL LOW (ref 12.0–15.0)
Lymphocytes Relative: 44.1 % (ref 12.0–46.0)
Lymphs Abs: 2.6 10*3/uL (ref 0.7–4.0)
MCHC: 32.5 g/dL (ref 30.0–36.0)
MCV: 95.2 fl (ref 78.0–100.0)
Monocytes Absolute: 0.4 10*3/uL (ref 0.1–1.0)
Monocytes Relative: 7.2 % (ref 3.0–12.0)
Neutro Abs: 2.7 10*3/uL (ref 1.4–7.7)
Neutrophils Relative %: 46.8 % (ref 43.0–77.0)
Platelets: 236 10*3/uL (ref 150.0–400.0)
RBC: 3.85 Mil/uL — ABNORMAL LOW (ref 3.87–5.11)
RDW: 14.8 % (ref 11.5–15.5)
WBC: 5.8 10*3/uL (ref 4.0–10.5)

## 2023-04-19 LAB — LIPID PANEL
Cholesterol: 208 mg/dL — ABNORMAL HIGH (ref 0–200)
HDL: 59.7 mg/dL (ref >39.00)
LDL Cholesterol: 125 mg/dL — ABNORMAL HIGH (ref 0–99)
NonHDL: 148.17
Total CHOL/HDL Ratio: 3
Triglycerides: 117 mg/dL (ref 0.0–149.0)
VLDL: 23.4 mg/dL (ref 0.0–40.0)

## 2023-04-19 LAB — HEMOGLOBIN A1C: Hgb A1c MFr Bld: 6.4 % (ref 4.6–6.5)

## 2023-04-19 LAB — TSH: TSH: 3.28 u[IU]/mL (ref 0.35–5.50)

## 2023-04-19 MED ORDER — CYANOCOBALAMIN 1000 MCG/ML IJ SOLN
1000.0000 ug | Freq: Once | INTRAMUSCULAR | Status: AC
Start: 2023-04-19 — End: 2023-04-19
  Administered 2023-04-19: 1000 ug via INTRAMUSCULAR

## 2023-04-19 MED ORDER — SERTRALINE HCL 50 MG PO TABS
50.0000 mg | ORAL_TABLET | Freq: Every day | ORAL | 0 refills | Status: DC
Start: 2023-04-19 — End: 2023-07-01

## 2023-04-19 MED ORDER — TIRZEPATIDE 5 MG/0.5ML ~~LOC~~ SOAJ
5.0000 mg | SUBCUTANEOUS | 0 refills | Status: DC
Start: 2023-04-19 — End: 2023-08-24

## 2023-04-19 MED ORDER — ALPRAZOLAM 0.5 MG PO TABS
0.5000 mg | ORAL_TABLET | Freq: Three times a day (TID) | ORAL | 0 refills | Status: DC | PRN
Start: 2023-04-19 — End: 2024-07-04

## 2023-04-19 NOTE — Progress Notes (Unsigned)
Subjective:  Patient ID: Stephanie [name], female    DOB: 28-Apr-1951  Age: 72 y.o. MRN: [id number]  CC: Anemia, Diabetes, Hyperlipidemia, and Congestive Heart Failure   HPI Badia Derck presents for f/up ---  Discussed the use of AI scribe software for clinical note transcription with the patient, who gave verbal consent to proceed.  History of Present Illness   The patient, with a history of depression and diabetes, reports difficulty in obtaining their prescribed medication, Mounjaro, from the pharmacy, leading to a missed dose last month. They also report running out of their prescribed Zoloft (Sertraline) for depression in April and express a desire to restart this medication.  The patient denies symptoms of high blood sugar such as excessive thirst, excessive urination, or drastic changes in weight or appetite. However, they note a decrease in appetite, which has recently improved slightly. They deny chest pain, dizziness, lightheadedness, and swelling in the legs or feet.  The patient also reports irregular heartbeats, prompting them to schedule an appointment with a cardiologist. They deny any thoughts of self-harm or harm to others. The patient acknowledges that it has been over a year since their last eye exam and plans to schedule an appointment.       Outpatient Medications Prior to Visit  Medication Sig Dispense Refill   Accu-Chek FastClix Lancets MISC      allopurinol (ZYLOPRIM) 100 MG tablet TAKE 1/2 TABLET(50 MG) BY MOUTH TWICE DAILY 90 tablet 3   BD PEN NEEDLE NANO 2ND GEN 32G X 4 MM MISC USE EVERY DAY AS DIRECTED 100 each 1   Blood Pressure KIT 1 kit by Does not apply route daily. Check blood pressure once a day 1 kit 0   colchicine 0.6 MG tablet Take 0.6 mg by mouth daily as needed (gout).     Continuous Blood Gluc Receiver (DEXCOM G7 RECEIVER) DEVI 1 Act by Does not apply route daily. 9 each 1   Continuous Blood Gluc Sensor (DEXCOM G7 SENSOR) MISC 1 Act by Does not  apply route daily. 9 each 1   dabigatran (PRADAXA) 150 MG CAPS capsule TAKE 1 CAPSULE BY MOUTH EVERY 12 HOURS 60 capsule 5   furosemide (LASIX) 40 MG tablet Take 1 tablet (40 mg total) by mouth daily. 90 tablet 1   hydrOXYzine (ATARAX) 10 MG tablet Take 1 tablet (10 mg total) by mouth every 8 (eight) hours as needed. 270 tablet 0   Insulin Pen Needle (NOVOFINE) 30G X 8 MM MISC For Humalog meal time TID 100 each 11   JARDIANCE 25 MG TABS tablet TAKE 1 TABLET(25 MG) BY MOUTH DAILY 90 tablet 1   metoprolol succinate (TOPROL-XL) 25 MG 24 hr tablet TAKE 1 TABLET(25 MG) BY MOUTH DAILY 30 tablet 0   MULTAQ 400 MG tablet TAKE 1 TABLET(400 MG) BY MOUTH TWICE DAILY 60 tablet 0   potassium chloride SA (KLOR-CON M15) 15 MEQ tablet Take 1 tablet (15 mEq total) by mouth 3 (three) times daily. 270 tablet 0   sacubitril-valsartan (ENTRESTO) 24-26 MG Take 1 tablet by mouth 2 (two) times daily. 180 tablet 2   simvastatin (ZOCOR) 10 MG tablet Take 1 tablet (10 mg total) by mouth daily. 90 tablet 1   tirzepatide (MOUNJARO) 5 MG/0.5ML Pen Inject 5 mg into the skin once a week. 6 mL 0   ALPRAZolam (XANAX) 0.5 MG tablet Take 1 tablet (0.5 mg total) by mouth 3 (three) times daily as needed for anxiety. (Patient taking differently: Take 0.5 mg by  mouth at bedtime as needed for sleep.) 270 tablet 0   meloxicam (MOBIC) 15 MG tablet Take 1 tablet (15 mg total) by mouth daily. 30 tablet 0   No facility-administered medications prior to visit.    ROS Review of Systems  Objective:  BP 138/74 (BP Location: Left Arm, Patient Position: Sitting, Cuff Size: Large)   Temp 97.9 F (36.6 C) (Oral)   Ht 5\' 3"  (1.6 m)   Wt 267 lb (121.1 kg)   BMI 47.30 kg/m   BP Readings from Last 3 Encounters:  04/19/23 138/74  01/11/23 (!) 136/58  01/11/23 110/72    Wt Readings from Last 3 Encounters:  04/19/23 267 lb (121.1 kg)  02/10/23 275 lb (124.7 kg)  01/11/23 274 lb (124.3 kg)    Physical Exam Cardiovascular:     Rate  and Rhythm: Normal rate. Rhythm irregularly irregular.     Lab Results  Component Value Date   WBC 5.8 04/19/2023   HGB 11.9 (L) 04/19/2023   HCT 36.6 04/19/2023   PLT 236.0 04/19/2023   GLUCOSE 128 (H) 04/19/2023   CHOL 208 (H) 04/19/2023   TRIG 117.0 04/19/2023   HDL 59.70 04/19/2023   LDLCALC 125 (H) 04/19/2023   ALT 8 06/20/2022   AST 11 (L) 06/20/2022   NA 137 04/19/2023   K 4.2 04/19/2023   CL 99 04/19/2023   CREATININE 1.87 (H) 04/19/2023   BUN 43 (H) 04/19/2023   CO2 29 04/19/2023   TSH 3.28 04/19/2023   INR 1.2 (H) 04/05/2013   HGBA1C 6.4 04/19/2023   MICROALBUR 4.6 (H) 11/10/2022    CT ABDOMEN PELVIS W CONTRAST  Result Date: 06/19/2022 CLINICAL DATA:  Abdominal pain for 2 days EXAM: CT ABDOMEN AND PELVIS WITH CONTRAST TECHNIQUE: Multidetector CT imaging of the abdomen and pelvis was performed using the standard protocol following bolus administration of intravenous contrast. RADIATION DOSE REDUCTION: This exam was performed according to the departmental dose-optimization program which includes automated exposure control, adjustment of the mA and/or kV according to patient size and/or use of iterative reconstruction technique. CONTRAST:  80mL OMNIPAQUE IOHEXOL 300 MG/ML  SOLN COMPARISON:  01/26/2022 FINDINGS: Lower chest: No acute abnormality. Hepatobiliary: No focal liver abnormality is seen. Status post cholecystectomy. No biliary dilatation. Pancreas: Unremarkable. No pancreatic ductal dilatation or surrounding inflammatory changes. Spleen: Normal in size without focal abnormality. Adrenals/Urinary Tract: Adrenal glands are within normal limits. Kidneys demonstrate a normal enhancement pattern. No renal calculi or obstructive changes are seen. Bladder is partially distended. Stomach/Bowel: Diverticular change is seen. Some surrounding inflammatory changes are noted at the junction of the rectum and sigmoid consistent with diverticulitis. Few small foci of air are noted which  likely represent micro perforations. No abscess is seen. The stomach is within normal limits. Small bowel is unremarkable. The appendix is within normal limits. Stomach is within normal limits. Vascular/Lymphatic: No significant vascular findings are present. No enlarged abdominal or pelvic lymph nodes. Reproductive: Status post hysterectomy. No adnexal masses. Other: No free fluid is noted. Musculoskeletal: Degenerative changes of lumbar spine are noted. No rib abnormality is seen. IMPRESSION: Diverticulitis similar to that noted on the prior exam. A few small extraluminal foci of air are noted consistent with micro perforations. No abscess is noted. Postsurgical changes. Electronically Signed   By: Alcide Clever M.D.   On: 06/19/2022 03:07    Assessment & Plan:  B12 deficiency -     CBC with Differential/Platelet; Future -     Cyanocobalamin  Chronic kidney  disease (CKD) stage G3a/A3, moderately decreased glomerular filtration rate (GFR) between 45-59 mL/min/1.73 square meter and albuminuria creatinine ratio greater than 300 mg/g (HCC) -     Basic metabolic panel; Future  Dyslipidemia, goal LDL below 100 -     Lipid panel; Future -     TSH; Future  Essential hypertension -     TSH; Future  Diuretic-induced hypokalemia  GAD (generalized anxiety disorder) -     TSH; Future -     ALPRAZolam; Take 1 tablet (0.5 mg total) by mouth 3 (three) times daily as needed for anxiety.  Dispense: 270 tablet; Refill: 0 -     Sertraline HCl; Take 1 tablet (50 mg total) by mouth daily.  Dispense: 90 tablet; Refill: 0  Type II diabetes mellitus with complication (HCC) -     Hemoglobin A1c; Future -     Basic metabolic panel; Future  Persistent atrial fibrillation (HCC)  Visit for screening mammogram -     3D Screening Mammogram, Left and Right; Future     Follow-up: Return in about 4 months (around 08/20/2023).  Sanda Linger, MD

## 2023-04-19 NOTE — Patient Instructions (Signed)

## 2023-04-20 DIAGNOSIS — E119 Type 2 diabetes mellitus without complications: Secondary | ICD-10-CM | POA: Insufficient documentation

## 2023-04-20 DIAGNOSIS — N1832 Chronic kidney disease, stage 3b: Secondary | ICD-10-CM | POA: Insufficient documentation

## 2023-04-20 MED ORDER — KERENDIA 10 MG PO TABS
1.0000 | ORAL_TABLET | Freq: Every day | ORAL | 0 refills | Status: DC
Start: 2023-04-20 — End: 2023-05-24

## 2023-04-26 ENCOUNTER — Other Ambulatory Visit: Payer: Self-pay | Admitting: Internal Medicine

## 2023-04-26 ENCOUNTER — Ambulatory Visit: Payer: 59 | Attending: Internal Medicine | Admitting: Internal Medicine

## 2023-04-26 ENCOUNTER — Other Ambulatory Visit (HOSPITAL_COMMUNITY): Payer: Self-pay

## 2023-04-26 ENCOUNTER — Encounter: Payer: Self-pay | Admitting: Internal Medicine

## 2023-04-26 ENCOUNTER — Telehealth: Payer: Self-pay

## 2023-04-26 VITALS — BP 112/70 | HR 71 | Ht 63.0 in | Wt 264.8 lb

## 2023-04-26 DIAGNOSIS — Z01812 Encounter for preprocedural laboratory examination: Secondary | ICD-10-CM

## 2023-04-26 DIAGNOSIS — I4819 Other persistent atrial fibrillation: Secondary | ICD-10-CM

## 2023-04-26 LAB — BASIC METABOLIC PANEL
Chloride: 104 mmol/L (ref 96–106)
Creatinine, Ser: 1.56 mg/dL — ABNORMAL HIGH (ref 0.57–1.00)
Potassium: 4.8 mmol/L (ref 3.5–5.2)
eGFR: 36 mL/min/{1.73_m2} — ABNORMAL LOW (ref >59)

## 2023-04-26 LAB — CBC

## 2023-04-26 NOTE — Patient Instructions (Addendum)
Medication Instructions:  Your physician recommends that you continue on your current medications as directed. Please refer to the Current Medication list given to you today. RESTART Pradaxa today  *If you need a refill on your cardiac medications before your next appointment, please call your pharmacy*   Lab Work: Pre procedure labs today: BMET & CBC  If you have labs (blood work) drawn today and your tests are completely normal, you will receive your results only by: MyChart Message (if you have MyChart) OR A paper copy in the mail If you have any lab test that is abnormal or we need to change your treatment, we will call you to review the results.   Testing/Procedures: Your physician has recommended that you have a Cardioversion (DCCV). Electrical Cardioversion uses a jolt of electricity to your heart either through paddles or wired patches attached to your chest. This is a controlled, usually prescheduled, procedure. Defibrillation is done under light anesthesia in the hospital, and you usually go home the day of the procedure. This is done to get your heart back into a normal rhythm. You are not awake for the procedure. Please see the instructions below.   Follow-Up: At [facility], you and your health needs are our priority.  As part of our continuing mission to provide you with exceptional heart care, we have created designated Provider Care Teams.  These Care Teams include your primary Cardiologist (physician) and Advanced Practice Providers (APPs -  Physician Assistants and Nurse Practitioners) who all work together to provide you with the care you need, when you need it.  Your next appointment:   2 month(s)  The format for your next appointment:   In Person  Provider:   Francis Dowse, PA-C    Thank you for choosing [facility]!!   [phone]  Other Instructions      Dear Stephanie [name]  You are scheduled for a Cardioversion on Friday, August 2 with Dr.  Bjorn Pippin.  Please arrive at the [facility] (Main Entrance A) at [facility]: [address] [place], [place] [address] at 8:30 AM (This time is 1 hour(s) before your procedure to ensure your preparation). Free valet parking service is available. You will check in at ADMITTING. The support person will be asked to wait in the waiting room.  It is OK to have someone drop you off and come back when you are ready to be discharged.      DIET:  Nothing to eat or drink after midnight except a sip of water with medications (see medication instructions below) - take ONLY your Pradaxa the morning of this cardioversion  MEDICATION INSTRUCTIONS: !!IF ANY NEW MEDICATIONS ARE STARTED AFTER TODAY, PLEASE NOTIFY YOUR PROVIDER AS SOON AS POSSIBLE!!  FYI: Medications such as Semaglutide (Ozempic, [place]), Tirzepatide (Mounjaro, Zepbound), Dulaglutide (Trulicity), etc ("GLP1 agonists") AND Canagliflozin (Invokana), Dapagliflozin (Farxiga), Empagliflozin (Jardiance), Ertugliflozin (Ste[place]atro), Bexagliflozin [employer]) or any combination with one of these drugs such as Invokamet (Canagliflozin/Metformin), Synjardy (Empagliflozin/Metformin), etc ("SGLT2 inhibitors") must be held around the time of a procedure. This is not a comprehensive list of all of these drugs. Please review all of your medications and talk to your provider if you take any one of these. If you are not sure, ask your provider.   HOLD: Tirzepatide (Mounjaro, Zepbound) for 1 week prior to the procedure. Last dose on Tuesday, July 23.2024 HOLD: Empagliflozin [place]) for 3 days prior to the procedure. Last dose on Monday, July 29.  Continue taking your  anticoagulant (blood thinner): Dabigatran (Pradaxa).  You will need to continue this after your procedure until you are told by your provider that it is safe to stop.    LABS:  Today, 04/26/23 - BMET & CBC  FYI:  For your safety, and to allow [place] to monitor your vital signs accurately during the  surgery/procedure we request: If you have artificial nails, gel coating, SNS etc, please have those removed prior to your surgery/procedure. Not having the nail coverings /polish removed may result in cancellation or delay of your surgery/procedure.  You must have a responsible person to drive you home and stay in the waiting area during your procedure. Failure to do so could result in cancellation.  Bring your insurance cards.  *Special Note: Every effort is made to have your procedure done on time. Occasionally there are emergencies that occur at the hospital that may cause delays. Please be patient if a delay does occur.

## 2023-04-26 NOTE — Progress Notes (Signed)
Patient Care Team: [name], MD as PCP - General (Internal Medicine) Duke Salvia, MD as PCP - Electrophysiology (Cardiology)   HPI  Stephanie [name] is a 72 y.o. female  Seen in followup for atrial fibrillation in the context of morbid obesity ocular sarcoid and mild left ventricular hypertrophy.  Rate related cardiomyopathy with interval normalization  She saw Dr. Fawn Kirk 3/15 to consider catheter ablation.  She was not interested. He reduced her amiodarone to 200 mg a day. Encouraged her to refrain from alcohol and to work on blood pressure and weight reduction.  She had a sleep study 2014 with an AHI of 7.5   She is now being followed by Dr. Sharl Ma and he is working both on her thyroid as well as her diabetes.  Recurrent atrial fibrillation resulted in dronaderone, having declined dofetilide she is unaware of her ongoing atrial fibrillation today.  Functional status is stable with moderate dyspnea on exertion, no edema.  No chest pain.  Some lightheadedness with standing.  Recurrent cardiomyopathy with interval improvement following GDMT     DATE TEST EF   2012 LHC  Cors w/o obs dis  3/12 Echo     25 %   8/14 Echo  55-60 %   12/17 Echo  25-30% LAE (47/2.0/45)  5/19 Echo 15-20%   12/19 Echo  55-65%   8/21 Echo 45-50%   01/22 Echo 40-45%      Date TSH Cr K Hgb  6/17 1.9     1/18 <<0.01     3/18 2.2     6/18 7.4 1.1  12.6  5/19  1.09 4.6 11.1  9/19  1.58 4.9 11.8  11/19  1.42 5.2   5/20 4.02     6/21   1.33 3.7 11.2  6/22  1.66 4.8   10/22  1.43 4.1   7/24 3.28 1.87 4.2 11.9     Past Medical History:  Diagnosis Date   Anemia    Ankle fracture, right    x 2   Antineoplastic and immunosuppressive drugs causing adverse effect in therapeutic use    Asthma    Chronic venous hypertension without complications    Colon polyps 2009   Coronary artery disease    no CAD by cath 11.2010   Depressive disorder, not elsewhere classified    Diabetes mellitus  type 2 in obese    hgb AIC 6.1 09/02/2009, insulin dependent    Diastolic CHF, chronic (HCC)    reports stable   Diverticulosis of colon 2009   colonoscopy   Gout    cortisone  shots helped   Hepatitis, unspecified    hx of/per pt, never was told of having hepatitis   Hx of hysterectomy 2002   Hyperlipidemia    Hypertension    controlled on medication    Hypothyroidism    reports her thryoid levels are normal now    Irritable bowel syndrome    Lung nodule    12mm RLL nodule on CT Chest  07/2009, resolved by 2011 CT   Noncompliance    Obesity    Persistent atrial fibrillation (HCC)    s/p DCCV 07/2009; Pradaxa Rx, states she is in sinus rhythm now    PONV (postoperative nausea and vomiting)    after receiving "too much anesthesia" after a hernia surgery    Sarcoidosis    dx by eye exam; seen during eye exam , report asymtptomatic "i dont have it now'  Sleep disturbance 06/2013   sleep study, mild abnormality, no criteria for CPAP    Past Surgical History:  Procedure Laterality Date   ABDOMINAL HYSTERECTOMY     CARDIOVERSION     x 2   CARDIOVERSION N/A 02/27/2013   Procedure: CARDIOVERSION;  Surgeon: Lewayne Bunting, MD;  Location: [facility] ENDOSCOPY;  Service: Cardiovascular;  Laterality: N/A;   CARDIOVERSION N/A 03/24/2017   Procedure: CARDIOVERSION;  Surgeon: Wendall Stade, MD;  Location: [facility] ENDOSCOPY;  Service: Cardiovascular;  Laterality: N/A;   CARDIOVERSION N/A 03/04/2020   Procedure: CARDIOVERSION;  Surgeon: Wendall Stade, MD;  Location: [facility] ENDOSCOPY;  Service: Cardiovascular;  Laterality: N/A;   CHOLECYSTECTOMY  early 80s   COLONOSCOPY     Diverticulosis, colon polyps, repeat 2014, Dr. Arlyce Dice   COLONOSCOPY WITH PROPOFOL N/A 07/25/2019   Procedure: COLONOSCOPY WITH PROPOFOL;  Surgeon: Benancio Deeds, MD;  Location: [facility] ENDOSCOPY;  Service: Gastroenterology;  Laterality: N/A;   ESOPHAGOGASTRODUODENOSCOPY N/A 05/16/2014   Procedure: ESOPHAGOGASTRODUODENOSCOPY (EGD);   Surgeon: Graylin Shiver, MD;  Location: [facility] ORS;  Service: Gastroenterology;  Laterality: N/A;   HERNIA REPAIR  2009   umbilical hernia   MYOMECTOMY  2000   POLYPECTOMY  07/25/2019   Procedure: POLYPECTOMY;  Surgeon: Benancio Deeds, MD;  Location: [facility] ENDOSCOPY;  Service: Gastroenterology;;    Current Outpatient Medications  Medication Sig Dispense Refill   Accu-Chek FastClix Lancets MISC      allopurinol (ZYLOPRIM) 100 MG tablet TAKE 1/2 TABLET(50 MG) BY MOUTH TWICE DAILY 90 tablet 3   ALPRAZolam (XANAX) 0.5 MG tablet Take 1 tablet (0.5 mg total) by mouth 3 (three) times daily as needed for anxiety. 270 tablet 0   Blood Pressure KIT 1 kit by Does not apply route daily. Check blood pressure once a day 1 kit 0   colchicine 0.6 MG tablet Take 0.6 mg by mouth daily as needed (gout).     dabigatran (PRADAXA) 150 MG CAPS capsule TAKE 1 CAPSULE BY MOUTH EVERY 12 HOURS (Patient not taking: Reported on 04/26/2023) 60 capsule 5   Finerenone (KERENDIA) 10 MG TABS Take 1 tablet (10 mg total) by mouth daily. (Patient not taking: Reported on 04/26/2023) 90 tablet 0   furosemide (LASIX) 40 MG tablet Take 1 tablet (40 mg total) by mouth daily. 90 tablet 1   JARDIANCE 25 MG TABS tablet TAKE 1 TABLET(25 MG) BY MOUTH DAILY 90 tablet 1   metoprolol succinate (TOPROL-XL) 25 MG 24 hr tablet TAKE 1 TABLET(25 MG) BY MOUTH DAILY 30 tablet 0   MULTAQ 400 MG tablet TAKE 1 TABLET(400 MG) BY MOUTH TWICE DAILY 60 tablet 0   potassium chloride SA (KLOR-CON M15) 15 MEQ tablet Take 1 tablet (15 mEq total) by mouth 3 (three) times daily. 270 tablet 0   sacubitril-valsartan (ENTRESTO) 24-26 MG Take 1 tablet by mouth 2 (two) times daily. 180 tablet 2   sertraline (ZOLOFT) 50 MG tablet Take 1 tablet (50 mg total) by mouth daily. 90 tablet 0   simvastatin (ZOCOR) 10 MG tablet Take 1 tablet (10 mg total) by mouth daily. 90 tablet 1   tirzepatide (MOUNJARO) 5 MG/0.5ML Pen Inject 5 mg into the skin once a week. 6 mL 0   No  current facility-administered medications for this visit.    Allergies  Allergen Reactions   Amiodarone Other (See Comments)    adverse reaction: Tremor/gait disurbance   Aspirin Other (See Comments)    "Burns my stomach"   Methimazole Nausea And Vomiting  Propoxyphene Nausea And Vomiting   Trintellix [Vortioxetine] Nausea And Vomiting   Albuterol Palpitations   Atorvastatin Other (See Comments)    Body aches and pains--stiffness.    Livalo [Pitavastatin]     Body aches    Review of Systems negative except from HPI and PMH  Physical Exam: BP 112/70   Pulse 71   Ht 5\' 3"  (1.6 m)   Wt 264 lb 12.8 oz (120.1 kg)   SpO2 98%   BMI 46.91 kg/m  Well developed and nourished in no acute distress HENT normal Neck supple  Clear Irregular rate and rhythm, no murmurs or gallops Abd-soft with active BS No Clubbing cyanosis edema Skin-warm and dry A & Oriented  Grossly normal sensory and motor function  ECG atrial fibrillation at 81 Levels-/12/36  Sinus rhythm 3/23   Assessment and  Plan  Hypertension     Atrial fibrillation-persistent-recurrent increasing-  Cardiomyopathy non ischemic resolved intercurrently  Congestive heart failure  chronic class III  Sarcoid  Obesity - morbid    Atrial fibrillation is persistent; the last recorded sinus rhythm is 3/23 with an interval A-fib recording.  It is not clear that she is symptomatic.  We discussed strategies including abandon efforts to maintain sinus rhythm.  Will try sinus rhythm 1 more time; will have her hold her dronaderone for 3 weeks as she has not been taking her dabigatran regularly.  Will resume it.  And then undertake cardioversion about 4 weeks.  Will then reevaluate to see whether she has improved function following cardioversion.  Given her cardiomyopathy, we will continue her on Entresto, SGLT2 and metoprolol.  Great deal of psychosocial stress including the interval death of her grandson who lives in [place], had CP age 83.Marland Kitchen

## 2023-04-26 NOTE — Telephone Encounter (Signed)
Pharmacy Patient Advocate Encounter   Received notification from CoverMyMeds that prior authorization for Pradaxa 150mg  capsules is required/requested.   Insurance verification completed.   The patient is insured through [facility] .    PA submitted to [employer] via CoverMyMeds Key/confirmation #/EOC [facility] Status is pending

## 2023-04-27 LAB — BASIC METABOLIC PANEL
BUN/Creatinine Ratio: 20 (ref 12–28)
BUN: 31 mg/dL — ABNORMAL HIGH (ref 8–27)
CO2: 24 mmol/L (ref 20–29)
Calcium: 9.7 mg/dL (ref 8.7–10.3)
Glucose: 132 mg/dL — ABNORMAL HIGH (ref 70–99)
Sodium: 141 mmol/L (ref 134–144)

## 2023-04-27 LAB — CBC
Hematocrit: 36.1 % (ref 34.0–46.6)
MCV: 95 fL (ref 79–97)
RDW: 13.1 % (ref 11.7–15.4)
WBC: 7.4 10*3/uL (ref 3.4–10.8)

## 2023-04-28 ENCOUNTER — Telehealth: Payer: Self-pay | Admitting: Internal Medicine

## 2023-04-28 NOTE — Telephone Encounter (Signed)
Pt c/o medication issue:  1. Name of Medication: dabigatran (PRADAXA) 150 MG CAPS capsule   2. How are you currently taking this medication (dosage and times per day)? TAKE 1 CAPSULE BY MOUTH EVERY 12 HOURSPatient not taking: Reported on 04/26/2023   3. Are you having a reaction (difficulty breathing--STAT)? No  4. What is your medication issue? Patient is calling to follow up on this medication. Please advise.

## 2023-04-28 NOTE — Telephone Encounter (Signed)
Called pt in regards to status of Pradaxa.  Advised pt that PA was denied.  Gave pt information for [employer] pt assistance.   Reports has not taken med in about 2 months.  Has new insurance and it doesn't cover some of medications. Advised pt will see if MD can do an appeal as pt has been on medication for over 5 years.  Left a message for Kayla, CPhT to check into doing an appeal.   Pt had a complaint about experience with RN at last OV.  Advised pt will send a message to Nursing Supervisor to f/u.

## 2023-04-28 NOTE — Telephone Encounter (Addendum)
Pharmacy Patient Advocate Encounter  Received notification from [facility] that the request for prior authorization for PRADAXA has been denied due to PLAN CRITERIA REQUIRES FAILURE OF COVERED DRUGS FIRST Carlena Hurl) OR DEMONSTRATE WHY PREFERRED DRUG IS NOT APPROPRIATE FOR TREATMENT   PLEASE ADVISE  Haze Rushing, CPhT Pharmacy Patient Advocate Specialist Direct Number: [phone] Fax: [phone]

## 2023-04-30 NOTE — Telephone Encounter (Signed)
Patient's plan denied Pradaxa. Reasoning for denial was provided on previous encounter.    This plan does not have the same formulary as patient's previous plan, therefore they have different requirements for approval.   Patient has to have tried and failed Xarelto or have a clinical reason as to why Xarelto therapy is not appropriate for patient.   "Patient has been on Pradaxa" is not a clinical explanation that they will accept for an appeal.   At this time I don't have enough clinical information to submit an appeal and get an approval.    RPH, PLEASE ADVISE

## 2023-04-30 NOTE — Telephone Encounter (Signed)
Tried to call pt 3 times. Was not able to leave message on first 2 numbers tried. Was able to leave a message on the last call. Others in triage have tried to call this afternoon before me. Will send messages to Dr. Graciela Husbands for his view.

## 2023-04-30 NOTE — Telephone Encounter (Signed)
Tried to call patient, no answer. Will try again later

## 2023-04-30 NOTE — Telephone Encounter (Signed)
Patient is returning call.  °

## 2023-04-30 NOTE — Telephone Encounter (Signed)
Pradaxa is not on her current formulary, recommend changing to either Xarelto 20mg  once daily with dinner, or Eliquis 5mg  twice daily. Otherwise could bypass insurance and use GoodRx coupon for generic Pradaxa which usually runs ~$70/month copay.  She'll need to be on uninterrupted anticoag for 3 weeks prior to upcoming cardioversion which is currently scheduled on 8/2 in exactly 3 weeks.

## 2023-05-03 ENCOUNTER — Ambulatory Visit (HOSPITAL_COMMUNITY)
Admission: EM | Admit: 2023-05-03 | Discharge: 2023-05-03 | Disposition: A | Discharge status: Home / Self Care | Payer: 59 | Attending: Physician Assistant | Admitting: Physician Assistant

## 2023-05-03 ENCOUNTER — Emergency Department (HOSPITAL_COMMUNITY): Payer: 59

## 2023-05-03 ENCOUNTER — Emergency Department (HOSPITAL_COMMUNITY)
Admission: EM | Admit: 2023-05-03 | Discharge: 2023-05-04 | Disposition: A | Discharge status: Home / Self Care | Payer: 59 | Attending: Emergency Medicine | Admitting: Emergency Medicine

## 2023-05-03 ENCOUNTER — Encounter (HOSPITAL_COMMUNITY): Payer: Self-pay | Admitting: Emergency Medicine

## 2023-05-03 ENCOUNTER — Telehealth: Payer: Self-pay | Admitting: Internal Medicine

## 2023-05-03 ENCOUNTER — Other Ambulatory Visit: Payer: Self-pay

## 2023-05-03 ENCOUNTER — Encounter (HOSPITAL_COMMUNITY): Payer: Self-pay

## 2023-05-03 DIAGNOSIS — K5732 Diverticulitis of large intestine without perforation or abscess without bleeding: Secondary | ICD-10-CM | POA: Insufficient documentation

## 2023-05-03 DIAGNOSIS — Z7901 Long term (current) use of anticoagulants: Secondary | ICD-10-CM | POA: Diagnosis not present

## 2023-05-03 DIAGNOSIS — R109 Unspecified abdominal pain: Secondary | ICD-10-CM | POA: Diagnosis present

## 2023-05-03 DIAGNOSIS — I509 Heart failure, unspecified: Secondary | ICD-10-CM | POA: Insufficient documentation

## 2023-05-03 DIAGNOSIS — K5792 Diverticulitis of intestine, part unspecified, without perforation or abscess without bleeding: Secondary | ICD-10-CM | POA: Diagnosis not present

## 2023-05-03 DIAGNOSIS — E119 Type 2 diabetes mellitus without complications: Secondary | ICD-10-CM | POA: Insufficient documentation

## 2023-05-03 DIAGNOSIS — I110 Hypertensive heart disease with heart failure: Secondary | ICD-10-CM | POA: Insufficient documentation

## 2023-05-03 DIAGNOSIS — Z79899 Other long term (current) drug therapy: Secondary | ICD-10-CM | POA: Diagnosis not present

## 2023-05-03 DIAGNOSIS — R1030 Lower abdominal pain, unspecified: Secondary | ICD-10-CM

## 2023-05-03 DIAGNOSIS — R1032 Left lower quadrant pain: Secondary | ICD-10-CM | POA: Diagnosis not present

## 2023-05-03 LAB — CBC WITH DIFFERENTIAL/PLATELET
Abs Immature Granulocytes: 0.02 10*3/uL (ref 0.00–0.07)
Basophils Absolute: 0 10*3/uL (ref 0.0–0.1)
Basophils Relative: 0 %
Eosinophils Absolute: 0 10*3/uL (ref 0.0–0.5)
Eosinophils Relative: 1 %
HCT: 36.6 % (ref 36.0–46.0)
Hemoglobin: 11.6 g/dL — ABNORMAL LOW (ref 12.0–15.0)
Immature Granulocytes: 0 %
Lymphocytes Relative: 36 %
Lymphs Abs: 2.4 10*3/uL (ref 0.7–4.0)
MCH: 31.3 pg (ref 26.0–34.0)
MCHC: 31.7 g/dL (ref 30.0–36.0)
MCV: 98.7 fL (ref 80.0–100.0)
Monocytes Absolute: 0.6 10*3/uL (ref 0.1–1.0)
Monocytes Relative: 9 %
Neutro Abs: 3.6 10*3/uL (ref 1.7–7.7)
Neutrophils Relative %: 54 %
Platelets: 239 10*3/uL (ref 150–400)
RBC: 3.71 MIL/uL — ABNORMAL LOW (ref 3.87–5.11)
RDW: 14 % (ref 11.5–15.5)
WBC: 6.6 10*3/uL (ref 4.0–10.5)
nRBC: 0 % (ref 0.0–0.2)

## 2023-05-03 LAB — POCT URINALYSIS DIP (MANUAL ENTRY)
Glucose, UA: >=1000 mg/dL — AB
Ketones, POC UA: NEGATIVE mg/dL
Leukocytes, UA: NEGATIVE
Nitrite, UA: NEGATIVE
Protein Ur, POC: 100 mg/dL — AB
Spec Grav, UA: 1.025 (ref 1.010–1.025)
Urobilinogen, UA: 1 E.U./dL
pH, UA: 5.5 (ref 5.0–8.0)

## 2023-05-03 LAB — COMPREHENSIVE METABOLIC PANEL
ALT: 12 U/L (ref 0–44)
AST: 13 U/L — ABNORMAL LOW (ref 15–41)
Albumin: 3.7 g/dL (ref 3.5–5.0)
Alkaline Phosphatase: 75 U/L (ref 38–126)
Anion gap: 10 (ref 5–15)
BUN: 34 mg/dL — ABNORMAL HIGH (ref 8–23)
CO2: 23 mmol/L (ref 22–32)
Calcium: 9.2 mg/dL (ref 8.9–10.3)
Chloride: 103 mmol/L (ref 98–111)
Creatinine, Ser: 1.62 mg/dL — ABNORMAL HIGH (ref 0.44–1.00)
GFR, Estimated: 34 mL/min — ABNORMAL LOW (ref >60)
Glucose, Bld: 222 mg/dL — ABNORMAL HIGH (ref 70–99)
Potassium: 4.1 mmol/L (ref 3.5–5.1)
Sodium: 136 mmol/L (ref 135–145)
Total Bilirubin: 0.5 mg/dL (ref 0.3–1.2)
Total Protein: 7.9 g/dL (ref 6.5–8.1)

## 2023-05-03 LAB — URINALYSIS, ROUTINE W REFLEX MICROSCOPIC
Bilirubin Urine: NEGATIVE
Glucose, UA: >=500 mg/dL — AB
Hgb urine dipstick: NEGATIVE
Ketones, ur: NEGATIVE mg/dL
Nitrite: NEGATIVE
Protein, ur: 100 mg/dL — AB
Specific Gravity, Urine: 1.021 (ref 1.005–1.030)
pH: 5 (ref 5.0–8.0)

## 2023-05-03 LAB — LIPASE, BLOOD: Lipase: 37 U/L (ref 11–51)

## 2023-05-03 MED ORDER — IOHEXOL 300 MG/ML  SOLN
80.0000 mL | Freq: Once | INTRAMUSCULAR | Status: AC | PRN
  Administered 2023-05-03: 80 mL via INTRAVENOUS

## 2023-05-03 MED ORDER — APIXABAN 5 MG PO TABS: 5.0000 mg | ORAL_TABLET | Freq: Two times a day (BID) | ORAL | 5 refills | Status: DC

## 2023-05-03 MED ORDER — AMOXICILLIN-POT CLAVULANATE 875-125 MG PO TABS
1.0000 | ORAL_TABLET | Freq: Once | ORAL | Status: AC
  Administered 2023-05-03: 1 via ORAL
  Filled 2023-05-03: qty 1

## 2023-05-03 MED ORDER — AMOXICILLIN-POT CLAVULANATE 500-125 MG PO TABS: 1.0000 | ORAL_TABLET | Freq: Two times a day (BID) | ORAL | 0 refills | Status: DC

## 2023-05-03 MED ORDER — APIXABAN 5 MG PO TABS: 5.0000 mg | ORAL_TABLET | Freq: Two times a day (BID) | ORAL | 11 refills | Status: DC

## 2023-05-03 MED ORDER — SODIUM CHLORIDE (PF) 0.9 % IJ SOLN
INTRAMUSCULAR | Status: AC
  Filled 2023-05-03: qty 50

## 2023-05-03 MED ORDER — APIXABAN 5 MG PO TABS: 5.0000 mg | ORAL_TABLET | Freq: Two times a day (BID) | ORAL | Status: DC

## 2023-05-03 MED ORDER — ONDANSETRON HCL 4 MG/2ML IJ SOLN
4.0000 mg | Freq: Once | INTRAMUSCULAR | Status: AC
  Administered 2023-05-03: 4 mg via INTRAVENOUS
  Filled 2023-05-03: qty 2

## 2023-05-03 MED ORDER — FENTANYL CITRATE PF 50 MCG/ML IJ SOSY
50.0000 ug | PREFILLED_SYRINGE | Freq: Once | INTRAMUSCULAR | Status: AC
  Administered 2023-05-03: 50 ug via INTRAVENOUS
  Filled 2023-05-03: qty 1

## 2023-05-03 NOTE — Addendum Note (Signed)
Addended by: [name], Jaylen Claude L on: 05/03/2023 01:31 PM   Modules accepted: Orders

## 2023-05-03 NOTE — ED Triage Notes (Signed)
Says she just left the UC after being seen for generalized abdominal pain, subjective distention and painful bowel movements.   Was told they say blood in urine and having a probable diverticulitis flare uop and given rx for Augmentin.  Encouraged to ER if symptoms progressed. Said she did not want to wait too long and may need a CT scan.

## 2023-05-03 NOTE — Addendum Note (Signed)
Addended by: Morgaine Kimball E on: 05/03/2023 01:12 PM   Modules accepted: Orders

## 2023-05-03 NOTE — Telephone Encounter (Signed)
Patient said she has not been able to get Finerenone (KERENDIA) 10 MG TABS from the pharmacy. She said she is feeling worse and would like a call back from the nurse. Best callback is [phone].

## 2023-05-03 NOTE — Telephone Encounter (Addendum)
Called patient back. Patient has not been taking pradaxa for a while now, and cannot afford $70 a month. Sent in eliquis 5 mg by mouth twice a day that is on patient's formulary. Will give patient samples and move her cardioversion back since she has not been on any blood thinners. Hopefully patient can start eliquis 5 mg with no issues and get cardioversion on 06/04/23 with Dr. Flora Lipps. Patient will come and pick-up samples and instructions for cardioversion. Will forward to Dr. Graciela Husbands and his nurse so they are aware.

## 2023-05-03 NOTE — ED Provider Notes (Signed)
[facility] EMERGENCY DEPARTMENT AT [facility] Provider Note   CSN: [id number] Arrival date & time: 05/03/23  2003     History {Add pertinent medical, surgical, social history, OB history to HPI:1} Chief Complaint  Patient presents with   Abdominal Pain    Stephanie [name] is a 72 y.o. female.  The history is provided by the patient and medical records.  Abdominal Pain Associated symptoms: nausea    72 year old female with history of A-fib on Eliquis, congestive heart failure, dyslipidemia, hypertension, anxiety, DM 2, presenting to the ED with abdominal pain.  States that has been ongoing for about 3 days now, worse in her suprapubic and left lower quadrant.  She is having difficulty with her bowel movements, they are small and quite painful.  She has not noticed any blood in the stool.  She has had some nausea but no vomiting.  States she feels bloated.  She was initially seen in urgent care and told she had blood in her urine and sent here for further evaluation.  She has had prior cholecystectomy, hysterectomy, hernia repair.  She was hospitalized last year due to diverticulitis.  Home Medications Prior to Admission medications   Medication Sig Start Date End Date Taking? Authorizing Provider  Accu-Chek FastClix Lancets MISC  09/26/18   [provider]  allopurinol (ZYLOPRIM) 100 MG tablet TAKE 1/2 TABLET(50 MG) BY MOUTH TWICE DAILY 08/27/22   Rodolph Bong, MD  ALPRAZolam Prudy Feeler) 0.5 MG tablet Take 1 tablet (0.5 mg total) by mouth 3 (three) times daily as needed for anxiety. 04/19/23   [name], MD  amoxicillin-clavulanate (AUGMENTIN) 500-125 MG tablet Take 1 tablet by mouth in the morning and at bedtime. 05/03/23   Raspet, Noberto Retort, PA-C  apixaban (ELIQUIS) 5 MG TABS tablet Take 1 tablet (5 mg total) by mouth 2 (two) times daily. 05/03/23   Supple, Megan E, RPH-CPP  apixaban (ELIQUIS) 5 MG TABS tablet Take 1 tablet (5 mg total) by mouth 2 (two) times daily.  05/03/23   Supple, Megan E, RPH-CPP  Blood Pressure KIT 1 kit by Does not apply route daily. Check blood pressure once a day 03/11/21   Duke Salvia, MD  colchicine 0.6 MG tablet Take 0.6 mg by mouth daily as needed (gout). 12/25/21   [provider]  Finerenone (KERENDIA) 10 MG TABS Take 1 tablet (10 mg total) by mouth daily. Patient not taking: Reported on 04/26/2023 04/20/23   [name], MD  furosemide (LASIX) 40 MG tablet Take 1 tablet (40 mg total) by mouth daily. 01/11/23   [name], MD  JARDIANCE 25 MG TABS tablet TAKE 1 TABLET(25 MG) BY MOUTH DAILY 03/12/23   [name], MD  metoprolol succinate (TOPROL-XL) 25 MG 24 hr tablet TAKE 1 TABLET(25 MG) BY MOUTH DAILY 04/27/23   Duke Salvia, MD  MULTAQ 400 MG tablet TAKE 1 TABLET(400 MG) BY MOUTH TWICE DAILY 04/12/23   Duke Salvia, MD  potassium chloride SA (KLOR-CON M15) 15 MEQ tablet Take 1 tablet (15 mEq total) by mouth 3 (three) times daily. 02/09/23   [name], MD  sacubitril-valsartan (ENTRESTO) 24-26 MG Take 1 tablet by mouth 2 (two) times daily. 06/18/22   Duke Salvia, MD  sertraline (ZOLOFT) 50 MG tablet Take 1 tablet (50 mg total) by mouth daily. 04/19/23   [name], MD  simvastatin (ZOCOR) 10 MG tablet Take 1 tablet (10 mg total) by mouth daily. 01/11/23  [name], MD  tirzepatide [facility]) 5 MG/0.5ML Pen Inject 5 mg into the skin once a week. 04/19/23   [name], MD      Allergies    Amiodarone, Aspirin, Methimazole, Propoxyphene, Trintellix [vortioxetine], Albuterol, Atorvastatin, and Livalo [pitavastatin]    Review of Systems   Review of Systems  Gastrointestinal:  Positive for abdominal pain and nausea.  All other systems reviewed and are negative.   Physical Exam Updated Vital Signs BP (!) 141/88 (BP Location: Right Arm)   Pulse (!) 103   Temp 98.1 F (36.7 C) (Oral)   Resp 18   Ht 5\' 2"  (1.575 m)   Wt 121.6 kg   SpO2 100%   BMI 49.02 kg/m  Physical Exam Vitals  and nursing note reviewed.  Constitutional:      Appearance: She is well-developed.  HENT:     Head: Normocephalic and atraumatic.  Eyes:     Conjunctiva/sclera: Conjunctivae normal.     Pupils: Pupils are equal, round, and reactive to light.  Cardiovascular:     Rate and Rhythm: Normal rate and regular rhythm.     Heart sounds: Normal heart sounds.  Pulmonary:     Effort: Pulmonary effort is normal.     Breath sounds: Normal breath sounds.  Abdominal:     General: Bowel sounds are normal.     Palpations: Abdomen is soft.     Tenderness: There is abdominal tenderness in the suprapubic area and left lower quadrant.  Musculoskeletal:        General: Normal range of motion.     Cervical back: Normal range of motion.  Skin:    General: Skin is warm and dry.  Neurological:     Mental Status: She is alert and oriented to person, place, and time.     ED Results / Procedures / Treatments   Labs (all labs ordered are listed, but only abnormal results are displayed) Labs Reviewed  COMPREHENSIVE METABOLIC PANEL - Abnormal; Notable for the following components:      Result Value   Glucose, Bld 222 (*)    BUN 34 (*)    Creatinine, Ser 1.62 (*)    AST 13 (*)    GFR, Estimated 34 (*)    All other components within normal limits  URINALYSIS, ROUTINE W REFLEX MICROSCOPIC - Abnormal; Notable for the following components:   APPearance HAZY (*)    Glucose, UA >=500 (*)    Protein, ur 100 (*)    Leukocytes,Ua TRACE (*)    Bacteria, UA MANY (*)    All other components within normal limits  CBC WITH DIFFERENTIAL/PLATELET - Abnormal; Notable for the following components:   RBC 3.71 (*)    Hemoglobin 11.6 (*)    All other components within normal limits  LIPASE, BLOOD    EKG None  Radiology No results found.  Procedures Procedures  {Document cardiac monitor, telemetry assessment procedure when appropriate:1}  Medications Ordered in ED Medications  fentaNYL (SUBLIMAZE)  injection 50 mcg (has no administration in time range)    ED Course/ Medical Decision Making/ A&P   {   Click here for ABCD2, HEART and other calculatorsREFRESH Note before signing :1}                          Medical Decision Making Amount and/or Complexity of Data Reviewed Labs: ordered. Radiology: ordered.  Risk Prescription drug management.   ***  {Document critical care  time when appropriate:1} {Document review of labs and clinical decision tools ie heart score, Chads2Vasc2 etc:1}  {Document your independent review of radiology images, and any outside records:1} {Document your discussion with family members, caretakers, and with consultants:1} {Document social determinants of health affecting pt's care:1} {Document your decision making why or why not admission, treatments were needed:1} Final Clinical Impression(s) / ED Diagnoses Final diagnoses:  None    Rx / DC Orders ED Discharge Orders     None

## 2023-05-03 NOTE — ED Provider Notes (Signed)
[facility]    CSN: [id number] Arrival date & time: 05/03/23  1647      History   Chief Complaint Chief Complaint  Patient presents with   Abdominal Pain    HPI [name] is a 72 y.o. female.   Patient presents today with a 4-day history of abdominal pain.  She reports that this is worse in her lower abdomen and her suprapubic region.  Pain is rated 8 on a 0-10 pain scale, described as sharp, worse with trying to use the restroom, no alleviating factors identified.  She does report some urinary frequency but denies any significant dysuria, flank pain, hematuria, vaginal symptoms.  She does take a SGLT2 inhibitor (Jardiance) but denies recurrent UTI.  She does have a history of diverticulitis and states current symptoms are similar to previous episodes of this condition.  Denies any previous abdominal surgery.  She has had some constipation but reports that she is passing small amounts of stool as well as gas with her last bowel movement earlier today.  She denies any recent medication changes.  Denies any recent dietary changes.  She does have a history of A-fib and has not been consistently anticoagulated due to insurance coverage issues.  Denies any recent antibiotics.  She denies any fever, nausea, vomiting.  She does take [facility] and has been stable on this medication.    Past Medical History:  Diagnosis Date   Anemia    Ankle fracture, right    x 2   Antineoplastic and immunosuppressive drugs causing adverse effect in therapeutic use    Asthma    Chronic venous hypertension without complications    Colon polyps 2009   Coronary artery disease    no CAD by cath 11.2010   Depressive disorder, not elsewhere classified    Diabetes mellitus type 2 in obese    hgb AIC 6.1 09/02/2009, insulin dependent    Diastolic CHF, chronic (HCC)    reports stable   Diverticulosis of colon 2009   colonoscopy   Gout    cortisone  shots helped   Hepatitis, unspecified     hx of/per pt, never was told of having hepatitis   Hx of hysterectomy 2002   Hyperlipidemia    Hypertension    controlled on medication    Hypothyroidism    reports her thryoid levels are normal now    Irritable bowel syndrome    Lung nodule    12mm RLL nodule on CT Chest  07/2009, resolved by 2011 CT   Noncompliance    Obesity    Persistent atrial fibrillation (HCC)    s/p DCCV 07/2009; Pradaxa Rx, states she is in sinus rhythm now    PONV (postoperative nausea and vomiting)    after receiving "too much anesthesia" after a hernia surgery    Sarcoidosis    dx by eye exam; seen during eye exam , report asymtptomatic "i dont have it now'    Sleep disturbance 06/2013   sleep study, mild abnormality, no criteria for CPAP    Patient Active Problem List   Diagnosis Date Noted   Type 2 diabetes mellitus with stage 3b chronic kidney disease, without long-term current use of insulin (HCC) 04/20/2023   Type II diabetes mellitus with complication (HCC) 01/11/2023   Chronic kidney disease (CKD) stage G3a/A3, moderately decreased glomerular filtration rate (GFR) between 45-59 mL/min/1.73 square meter and albuminuria creatinine ratio greater than 300 mg/g (HCC) 11/11/2022   Full incontinence of feces 11/10/2022  Picker's nodules 06/10/2022   Primary osteoarthritis involving multiple joints 06/10/2022   Dyslipidemia, goal LDL below 100 06/10/2022   Diuretic-induced hypokalemia 06/10/2022   Long term (current) use of insulin (HCC) 03/18/2022   GAD (generalized anxiety disorder) 03/18/2022   Current severe episode of major depressive disorder without psychotic features without prior episode (HCC) 03/18/2022   Visit for screening mammogram 03/18/2022   Secondary hypercoagulable state (HCC) 09/04/2020   NICM (nonischemic cardiomyopathy) (HCC) 12/20/2019   Benign neoplasm of ascending colon    Degenerative arthritis of knee, bilateral 12/22/2017   Typical atrial flutter (HCC)    B12 deficiency  05/06/2016   Other specified nutritional anemias 08/02/2015   Obesity 08/02/2015   Gout 08/02/2015   Cardiomyopathy, secondary (HCC) 01/28/2011   Chronic diastolic heart failure (HCC) 09/27/2009   Essential hypertension 09/02/2009   Atrial fibrillation (HCC) 08/20/2009   Lung nodule 08/20/2009   CHRONIC VENOUS HYPERTENSION WITHOUT COMPS 11/21/2007    Past Surgical History:  Procedure Laterality Date   ABDOMINAL HYSTERECTOMY     CARDIOVERSION     x 2   CARDIOVERSION N/A 02/27/2013   Procedure: CARDIOVERSION;  Surgeon: Lewayne Bunting, MD;  Location: [facility] ENDOSCOPY;  Service: Cardiovascular;  Laterality: N/A;   CARDIOVERSION N/A 03/24/2017   Procedure: CARDIOVERSION;  Surgeon: Wendall Stade, MD;  Location: [facility] ENDOSCOPY;  Service: Cardiovascular;  Laterality: N/A;   CARDIOVERSION N/A 03/04/2020   Procedure: CARDIOVERSION;  Surgeon: Wendall Stade, MD;  Location: [facility] ENDOSCOPY;  Service: Cardiovascular;  Laterality: N/A;   CHOLECYSTECTOMY  early 80s   COLONOSCOPY     Diverticulosis, colon polyps, repeat 2014, Dr. Arlyce Dice   COLONOSCOPY WITH PROPOFOL N/A 07/25/2019   Procedure: COLONOSCOPY WITH PROPOFOL;  Surgeon: Benancio Deeds, MD;  Location: [facility] ENDOSCOPY;  Service: Gastroenterology;  Laterality: N/A;   ESOPHAGOGASTRODUODENOSCOPY N/A 05/16/2014   Procedure: ESOPHAGOGASTRODUODENOSCOPY (EGD);  Surgeon: Graylin Shiver, MD;  Location: [facility] ORS;  Service: Gastroenterology;  Laterality: N/A;   HERNIA REPAIR  2009   umbilical hernia   MYOMECTOMY  2000   POLYPECTOMY  07/25/2019   Procedure: POLYPECTOMY;  Surgeon: Benancio Deeds, MD;  Location: [facility] ENDOSCOPY;  Service: Gastroenterology;;    OB History   No obstetric history on file.      Home Medications    Prior to Admission medications   Medication Sig Start Date End Date Taking? Authorizing Provider  Accu-Chek FastClix Lancets MISC  09/26/18   [provider]  allopurinol (ZYLOPRIM) 100 MG tablet TAKE 1/2 TABLET(50 MG)  BY MOUTH TWICE DAILY 08/27/22   Rodolph Bong, MD  ALPRAZolam Prudy Feeler) 0.5 MG tablet Take 1 tablet (0.5 mg total) by mouth 3 (three) times daily as needed for anxiety. 04/19/23   [name], MD  amoxicillin-clavulanate (AUGMENTIN) 500-125 MG tablet Take 1 tablet by mouth in the morning and at bedtime. 05/03/23  Yes Deshaun Weisinger, Noberto Retort, PA-C  apixaban (ELIQUIS) 5 MG TABS tablet Take 1 tablet (5 mg total) by mouth 2 (two) times daily. 05/03/23   Supple, Megan E, RPH-CPP  apixaban (ELIQUIS) 5 MG TABS tablet Take 1 tablet (5 mg total) by mouth 2 (two) times daily. 05/03/23   Supple, Megan E, RPH-CPP  Blood Pressure KIT 1 kit by Does not apply route daily. Check blood pressure once a day 03/11/21   Duke Salvia, MD  colchicine 0.6 MG tablet Take 0.6 mg by mouth daily as needed (gout). 12/25/21   [provider]  Finerenone (KERENDIA) 10 MG TABS  Take 1 tablet (10 mg total) by mouth daily. Patient not taking: Reported on 04/26/2023 04/20/23   [name], MD  furosemide (LASIX) 40 MG tablet Take 1 tablet (40 mg total) by mouth daily. 01/11/23   [name], MD  JARDIANCE 25 MG TABS tablet TAKE 1 TABLET(25 MG) BY MOUTH DAILY 03/12/23   [name], MD  metoprolol succinate (TOPROL-XL) 25 MG 24 hr tablet TAKE 1 TABLET(25 MG) BY MOUTH DAILY 04/27/23   Duke Salvia, MD  MULTAQ 400 MG tablet TAKE 1 TABLET(400 MG) BY MOUTH TWICE DAILY 04/12/23   Duke Salvia, MD  potassium chloride SA (KLOR-CON M15) 15 MEQ tablet Take 1 tablet (15 mEq total) by mouth 3 (three) times daily. 02/09/23   [name], MD  sacubitril-valsartan (ENTRESTO) 24-26 MG Take 1 tablet by mouth 2 (two) times daily. 06/18/22   Duke Salvia, MD  sertraline (ZOLOFT) 50 MG tablet Take 1 tablet (50 mg total) by mouth daily. 04/19/23   [name], MD  simvastatin (ZOCOR) 10 MG tablet Take 1 tablet (10 mg total) by mouth daily. 01/11/23   [name], MD  tirzepatide [facility]) 5 MG/0.5ML Pen Inject 5 mg into the skin once a  week. 04/19/23   [name], MD    Family History Family History  Problem Relation Age of Onset   Pneumonia Father        deceased   Hypertension Father    Cancer Father    Multiple sclerosis Mother        hx   Emphysema Other        runs in the family    Social History Social History   Tobacco Use   Smoking status: Never   Smokeless tobacco: Never   Tobacco comments:    Never smoke 01/27/22  Vaping Use   Vaping status: Never Used  Substance Use Topics   Alcohol use: Yes    Alcohol/week: 0.0 standard drinks of alcohol    Comment: rare   Drug use: No     Allergies   Amiodarone, Aspirin, Methimazole, Propoxyphene, Trintellix [vortioxetine], Albuterol, Atorvastatin, and Livalo [pitavastatin]   Review of Systems Review of Systems  Constitutional:  Positive for activity change. Negative for appetite change, fatigue and fever.  Gastrointestinal:  Positive for abdominal pain and constipation. Negative for diarrhea, nausea and vomiting.  Genitourinary:  Positive for frequency. Negative for difficulty urinating, dysuria, urgency, vaginal bleeding, vaginal discharge and vaginal pain.     Physical Exam Triage Vital Signs ED Triage Vitals  Encounter Vitals Group     BP 05/03/23 1736 136/71     Systolic BP Percentile --      Diastolic BP Percentile --      Pulse Rate 05/03/23 1736 75     Resp 05/03/23 1736 18     Temp 05/03/23 1736 97.9 F (36.6 C)     Temp Source 05/03/23 1736 Oral     SpO2 05/03/23 1736 97 %     Weight --      Height --      Head Circumference --      Peak Flow --      Pain Score 05/03/23 1734 8     Pain Loc --      Pain Education --      Exclude from Growth Chart --    No data found.  Updated Vital Signs BP 136/71 (BP Location: Left Arm)   Pulse 75  Temp 97.9 F (36.6 C) (Oral)   Resp 18   SpO2 97%   Visual Acuity Right Eye Distance:   Left Eye Distance:   Bilateral Distance:    Right Eye Near:   Left Eye Near:     Bilateral Near:     Physical Exam Vitals reviewed.  Constitutional:      General: She is awake. She is not in acute distress.    Appearance: Normal appearance. She is well-developed. She is not ill-appearing.     Comments: Very pleasant female appears stated age in no acute distress sitting comfortably in exam room  HENT:     Head: Normocephalic and atraumatic.  Cardiovascular:     Rate and Rhythm: Normal rate. Rhythm irregularly irregular.     Heart sounds: Normal heart sounds, S1 normal and S2 normal. No murmur heard. Pulmonary:     Effort: Pulmonary effort is normal.     Breath sounds: Normal breath sounds. No wheezing, rhonchi or rales.     Comments: Clear auscultation bilaterally Abdominal:     General: Bowel sounds are normal.     Palpations: Abdomen is soft.     Tenderness: There is abdominal tenderness in the right lower quadrant, suprapubic area and left lower quadrant. There is no right CVA tenderness, left CVA tenderness, guarding or rebound. Negative signs include Rovsing's sign, McBurney's sign and psoas sign.     Comments: Significant tenderness palpation of lower abdomen.  No CVA tenderness.  Psychiatric:        Behavior: Behavior is cooperative.      UC Treatments / Results  Labs (all labs ordered are listed, but only abnormal results are displayed) Labs Reviewed  POCT URINALYSIS DIP (MANUAL ENTRY) - Abnormal; Notable for the following components:      Result Value   Color, UA straw (*)    Clarity, UA cloudy (*)    Glucose, UA >=1,000 (*)    Bilirubin, UA small (*)    Blood, UA trace-intact (*)    Protein Ur, POC =100 (*)    All other components within normal limits    EKG   Radiology No results found.  Procedures Procedures (including critical care time)  Medications Ordered in UC Medications - No data to display  Initial Impression / Assessment and Plan / UC Course  I have reviewed the triage vital signs and the nursing notes.  Pertinent  labs & imaging results that were available during my care of the patient were reviewed by me and considered in my medical decision making (see chart for details).     Patient is well-appearing, afebrile, nontoxic, nontachycardic.  Vital signs are reassuring today.  She did have tenderness to palpation throughout lower abdomen without guarding.  Urine did not show any evidence of infection.  Glucosuria was expected given SGLT2 use.  We discussed that the safest thing to do is to go to the emergency room since we do not have CT capabilities or stat labs in urgent care.  We also discussed that since this is similar to her previous episodes of diverticulitis it is possible that we can start an antibiotic outpatient given her normal vital signs.  No indication for dose adjustment based on metabolic panel from 04/26/2023 with creatinine clearance of 63.44 mL/min.  She prefer to try this but will have a low threshold for going to the emergency room if her symptoms or not improving quickly.  We discussed that if she does not go to the emergency  room tonight she would need to be reevaluated by someone such as her primary care tomorrow to arrange outpatient imaging and additional evaluation.  Offered CBC and CMP but patient declined this since it would likely be repeated if she goes to the emergency room and otherwise will have her primary caregiver.  Discussed the importance of pushing fluids.  Discussed that if at any point symptoms worsen and she has worsening pain, nausea/vomiting, obstipation, fever, chills she is to go to the emergency room immediately to which she expressed understanding.  Final Clinical Impressions(s) / UC Diagnoses   Final diagnoses:  Lower abdominal pain     Discharge Instructions      We are treating you for diverticulitis flare.  Start Augmentin twice daily as we discussed.  If your symptoms are worsening or not improving please go to the emergency room as we discussed.  If your  symptoms are improving please follow-up with your primary care as soon as possible.  If anything worsens and you have severe pain, nausea, vomiting, difficulty passing gas or bowel movement, fever, chills you need to go to the ER immediately.     ED Prescriptions     Medication Sig Dispense Auth. Provider   amoxicillin-clavulanate (AUGMENTIN) 500-125 MG tablet Take 1 tablet by mouth in the morning and at bedtime. 20 tablet Tuana Hoheisel, Noberto Retort, PA-C      PDMP not reviewed this encounter.   Jeani Hawking, PA-C 05/03/23 1845

## 2023-05-03 NOTE — ED Triage Notes (Signed)
Pt c/o lower abd pains since Friday. Repots when coughs pain is worse. Reports that she has to use the bathroom (urinate) every hour and feels like has to go really bad but then dont urinate much. Reports that hx diverticulitis. Told that having kidney problems and supposed to start new medication for that but reports hasn't gotten that medication yet.

## 2023-05-03 NOTE — Discharge Instructions (Signed)
We are treating you for diverticulitis flare.  Start Augmentin twice daily as we discussed.  If your symptoms are worsening or not improving please go to the emergency room as we discussed.  If your symptoms are improving please follow-up with your primary care as soon as possible.  If anything worsens and you have severe pain, nausea, vomiting, difficulty passing gas or bowel movement, fever, chills you need to go to the ER immediately.

## 2023-05-03 NOTE — Telephone Encounter (Signed)
Pt asking for a c/b - [phone]

## 2023-05-04 MED ORDER — ONDANSETRON 4 MG PO TBDP: 4.0000 mg | ORAL_TABLET | Freq: Three times a day (TID) | ORAL | 0 refills | Status: DC | PRN

## 2023-05-04 NOTE — Discharge Instructions (Addendum)
Take the Augmentin from urgent care as prescribed.  I have sent in some nausea medication as well if needed. Follow-up with your primary care doctor. Return here for any new/acute changes-- can't hold down medications, blood in stool, high fever, worsening pain, etc.

## 2023-05-06 ENCOUNTER — Other Ambulatory Visit: Payer: Self-pay | Admitting: Internal Medicine

## 2023-05-13 DIAGNOSIS — I10 Essential (primary) hypertension: Secondary | ICD-10-CM | POA: Diagnosis not present

## 2023-05-13 DIAGNOSIS — E119 Type 2 diabetes mellitus without complications: Secondary | ICD-10-CM | POA: Diagnosis not present

## 2023-05-13 DIAGNOSIS — Z794 Long term (current) use of insulin: Secondary | ICD-10-CM | POA: Diagnosis not present

## 2023-05-13 NOTE — Telephone Encounter (Signed)
Thx

## 2023-05-17 ENCOUNTER — Telehealth: Payer: Self-pay | Admitting: *Deleted

## 2023-05-17 NOTE — Telephone Encounter (Signed)
Transition Care Management Follow-up Telephone Call Date of discharge and from where: [name] 05/04/2023 How have you been since you were released from the hospital? Feeling some better calling to get an appt to see PCP for follow up Any questions or concerns? Yes Called PCP [name] and left message with [name] so patient was unable to see or talk to pcp  about her medication so patient felt she ended up the emergency room due to this lack of communication to the Stephanie ,  Items Reviewed: Did the pt receive and understand the discharge instructions provided? Yes  Medications obtained and verified? Yes  Other? No  Any new allergies since your discharge? Yes  Dietary orders reviewed? No Do you have support at home? No    Follow up appointments reviewed:  PCP Hospital f/u appt confirmed? Feeling some better calling to get an appt to see PCP for follow up  Are transportation arrangements needed? No  If their condition worsens, is the pt aware to call PCP or go to the Emergency Dept.? Yes Was the patient provided with contact information for the PCP's office or ED? Yes Was to pt encouraged to call back with questions or concerns? Yes

## 2023-05-23 NOTE — Pre-Procedure Instructions (Signed)
Attempted to call patient regarding procedure on 06/04/23 with anesthesia.  Anesthesia requires certain medications to be held 7 days before procedure.  Mounjaro-  Don't take any after 8/8,  you may resume it after procedure.  Unable to leave voicemail, mailbox not set up.

## 2023-05-24 ENCOUNTER — Other Ambulatory Visit (HOSPITAL_COMMUNITY): Payer: Self-pay

## 2023-05-24 ENCOUNTER — Telehealth: Payer: Self-pay

## 2023-05-24 ENCOUNTER — Encounter: Payer: Self-pay | Admitting: Internal Medicine

## 2023-05-24 ENCOUNTER — Ambulatory Visit (INDEPENDENT_AMBULATORY_CARE_PROVIDER_SITE_OTHER): Payer: 59 | Admitting: Internal Medicine

## 2023-05-24 VITALS — BP 140/84 | HR 83 | Temp 98.6°F | Resp 16 | Ht 62.0 in | Wt 261.0 lb

## 2023-05-24 DIAGNOSIS — E1122 Type 2 diabetes mellitus with diabetic chronic kidney disease: Secondary | ICD-10-CM

## 2023-05-24 DIAGNOSIS — I10 Essential (primary) hypertension: Secondary | ICD-10-CM | POA: Diagnosis not present

## 2023-05-24 DIAGNOSIS — N1832 Chronic kidney disease, stage 3b: Secondary | ICD-10-CM | POA: Diagnosis not present

## 2023-05-24 DIAGNOSIS — N1831 Chronic kidney disease, stage 3a: Secondary | ICD-10-CM

## 2023-05-24 DIAGNOSIS — E118 Type 2 diabetes mellitus with unspecified complications: Secondary | ICD-10-CM

## 2023-05-24 DIAGNOSIS — E785 Hyperlipidemia, unspecified: Secondary | ICD-10-CM

## 2023-05-24 LAB — MICROALBUMIN / CREATININE URINE RATIO
Creatinine,U: 130.5 mg/dL
Microalb Creat Ratio: 0.5 mg/g (ref 0.0–30.0)
Microalb, Ur: <0.7 mg/dL (ref 0.0–1.9)

## 2023-05-24 LAB — URINALYSIS, ROUTINE W REFLEX MICROSCOPIC
Bilirubin Urine: NEGATIVE
Hgb urine dipstick: NEGATIVE
Ketones, ur: NEGATIVE
Leukocytes,Ua: NEGATIVE
Nitrite: NEGATIVE
Specific Gravity, Urine: 1.02 (ref 1.000–1.030)
Total Protein, Urine: NEGATIVE
Urine Glucose: >=1000 — AB
Urobilinogen, UA: 0.2 (ref 0.0–1.0)
pH: 6 (ref 5.0–8.0)

## 2023-05-24 MED ORDER — SIMVASTATIN 10 MG PO TABS
10.0000 mg | ORAL_TABLET | Freq: Every day | ORAL | 1 refills | Status: DC
Start: 2023-05-24 — End: 2023-12-03

## 2023-05-24 MED ORDER — KERENDIA 10 MG PO TABS
1.0000 | ORAL_TABLET | Freq: Every day | ORAL | 0 refills | Status: AC
Start: 2023-05-24 — End: ?

## 2023-05-24 NOTE — Progress Notes (Signed)
Subjective:  Patient ID: Stephanie [name], female    DOB: 05-20-1951  Age: 72 y.o. MRN: [id number]  CC: Hypertension, Diabetes, Congestive Heart Failure, and Atrial Fibrillation   HPI Stephanie [name] presents for f/up ----  Discussed the use of AI scribe software for clinical note transcription with the patient, who gave verbal consent to proceed.  History of Present Illness   The patient, with a history of atrial fibrillation, presented for follow-up after a recent episode of diverticulitis treated with Augmentin. She reported difficulty in urinating, feeling the urge to urinate but being unable to do so. This was accompanied by abdominal pain, particularly in the lower region. The patient sought care at an urgent care center and was subsequently referred to [facility]. She was diagnosed with a urinary tract infection and treated with a 10-day course of antibiotics, which seemed to resolve the urinary symptoms.  The patient also reported a recent unintentional weight loss of six pounds, despite having a good appetite. She noted a change in her eating habits, eating less than she used to. She also reported ongoing issues with her heart, suspecting it to be in fibrillation.  The patient was on medication for her heart condition, including Entresto. She reported difficulty in obtaining Chauncey Mann, a medication presumably prescribed for her heart condition, due to issues with Medicaid coverage. She attempted to contact the clinic for assistance but reported difficulty in reaching the provider.       Outpatient Medications Prior to Visit  Medication Sig Dispense Refill   Accu-Chek FastClix Lancets MISC      allopurinol (ZYLOPRIM) 100 MG tablet TAKE 1/2 TABLET(50 MG) BY MOUTH TWICE DAILY 90 tablet 3   ALPRAZolam (XANAX) 0.5 MG tablet Take 1 tablet (0.5 mg total) by mouth 3 (three) times daily as needed for anxiety. 270 tablet 0   apixaban (ELIQUIS) 5 MG TABS tablet Take 1 tablet (5  mg total) by mouth 2 (two) times daily. 60 tablet 5   apixaban (ELIQUIS) 5 MG TABS tablet Take 1 tablet (5 mg total) by mouth 2 (two) times daily. 56 tablet    Blood Pressure KIT 1 kit by Does not apply route daily. Check blood pressure once a day 1 kit 0   colchicine 0.6 MG tablet Take 0.6 mg by mouth daily as needed (gout).     dronedarone (MULTAQ) 400 MG tablet TAKE 1 TABLET(400 MG) BY MOUTH TWICE DAILY 60 tablet 11   furosemide (LASIX) 40 MG tablet Take 1 tablet (40 mg total) by mouth daily. 90 tablet 1   JARDIANCE 25 MG TABS tablet TAKE 1 TABLET(25 MG) BY MOUTH DAILY 90 tablet 1   metoprolol succinate (TOPROL-XL) 25 MG 24 hr tablet TAKE 1 TABLET(25 MG) BY MOUTH DAILY 90 tablet 3   potassium chloride SA (KLOR-CON M15) 15 MEQ tablet Take 1 tablet (15 mEq total) by mouth 3 (three) times daily. 270 tablet 0   sacubitril-valsartan (ENTRESTO) 24-26 MG Take 1 tablet by mouth 2 (two) times daily. 180 tablet 2   sertraline (ZOLOFT) 50 MG tablet Take 1 tablet (50 mg total) by mouth daily. 90 tablet 0   tirzepatide (MOUNJARO) 5 MG/0.5ML Pen Inject 5 mg into the skin once a week. 6 mL 0   amoxicillin-clavulanate (AUGMENTIN) 500-125 MG tablet Take 1 tablet by mouth in the morning and at bedtime. 20 tablet 0   Finerenone (KERENDIA) 10 MG TABS Take 1 tablet (10 mg total) by mouth daily. 90 tablet 0  ondansetron (ZOFRAN-ODT) 4 MG disintegrating tablet Take 1 tablet (4 mg total) by mouth every 8 (eight) hours as needed for nausea. 10 tablet 0   simvastatin (ZOCOR) 10 MG tablet Take 1 tablet (10 mg total) by mouth daily. 90 tablet 1   No facility-administered medications prior to visit.    ROS Review of Systems  Constitutional:  Positive for fatigue and unexpected weight change. Negative for appetite change and fever.  HENT: Negative.    Eyes:  Negative for visual disturbance.  Respiratory: Negative.  Negative for cough, chest tightness, shortness of breath and wheezing.   Cardiovascular:  Negative for  chest pain, palpitations and leg swelling.  Gastrointestinal:  Negative for abdominal pain, constipation, diarrhea, nausea and vomiting.  Genitourinary: Negative.  Negative for difficulty urinating, dysuria, flank pain, hematuria and pelvic pain.  Musculoskeletal: Negative.  Negative for myalgias.  Skin:  Positive for color change and rash.  Neurological: Negative.   Hematological:  Negative for adenopathy. Does not bruise/bleed easily.  Psychiatric/Behavioral:  Positive for confusion.     Objective:  BP (!) 140/84 (BP Location: Left Arm, Patient Position: Sitting, Cuff Size: Large)   Pulse 83   Temp 98.6 F (37 C) (Oral)   Resp 16   Ht 5\' 2"  (1.575 m)   Wt 261 lb (118.4 kg)   SpO2 98%   BMI 47.74 kg/m   BP Readings from Last 3 Encounters:  05/24/23 (!) 140/84  05/04/23 137/80  05/03/23 136/71    Wt Readings from Last 3 Encounters:  05/24/23 261 lb (118.4 kg)  05/03/23 268 lb (121.6 kg)  04/26/23 264 lb 12.8 oz (120.1 kg)    Physical Exam Vitals reviewed.  Constitutional:      General: She is not in acute distress.    Appearance: She is not ill-appearing, toxic-appearing or diaphoretic.  HENT:     Mouth/Throat:     Mouth: Mucous membranes are moist.  Eyes:     General: No scleral icterus.    Conjunctiva/sclera: Conjunctivae normal.  Cardiovascular:     Rate and Rhythm: Normal rate. Rhythm irregularly irregular.     Heart sounds: No murmur heard. Pulmonary:     Effort: Pulmonary effort is normal.     Breath sounds: No stridor. No wheezing, rhonchi or rales.  Abdominal:     General: Abdomen is flat.     Palpations: There is no mass.     Tenderness: There is no abdominal tenderness. There is no guarding.     Hernia: No hernia is present.  Musculoskeletal:        General: Normal range of motion.     Cervical back: Neck supple.     Right lower leg: No edema.     Left lower leg: No edema.  Skin:    General: Skin is warm and dry.     Findings: Lesion and rash  present.  Neurological:     General: No focal deficit present.     Mental Status: She is alert.  Psychiatric:        Mood and Affect: Mood normal.        Behavior: Behavior normal.     Lab Results  Component Value Date   WBC 6.6 05/03/2023   HGB 11.6 (L) 05/03/2023   HCT 36.6 05/03/2023   PLT 239 05/03/2023   GLUCOSE 222 (H) 05/03/2023   CHOL 208 (H) 04/19/2023   TRIG 117.0 04/19/2023   HDL 59.70 04/19/2023   LDLCALC 125 (H) 04/19/2023  ALT 12 05/03/2023   AST 13 (L) 05/03/2023   NA 136 05/03/2023   K 4.1 05/03/2023   CL 103 05/03/2023   CREATININE 1.62 (H) 05/03/2023   BUN 34 (H) 05/03/2023   CO2 23 05/03/2023   TSH 3.28 04/19/2023   INR 1.2 (H) 04/05/2013   HGBA1C 6.4 04/19/2023   MICROALBUR <0.7 05/24/2023    CT ABDOMEN PELVIS W CONTRAST  Result Date: 05/03/2023 CLINICAL DATA:  Left lower quadrant abdominal pain EXAM: CT ABDOMEN AND PELVIS WITH CONTRAST TECHNIQUE: Multidetector CT imaging of the abdomen and pelvis was performed using the standard protocol following bolus administration of intravenous contrast. RADIATION DOSE REDUCTION: This exam was performed according to the departmental dose-optimization program which includes automated exposure control, adjustment of the mA and/or kV according to patient size and/or use of iterative reconstruction technique. CONTRAST:  80mL OMNIPAQUE IOHEXOL 300 MG/ML  SOLN COMPARISON:  06/19/2022 FINDINGS: Lower chest: No acute abnormality. Hepatobiliary: No focal liver abnormality is seen. Status post cholecystectomy. No biliary dilatation. Pancreas: Unremarkable Spleen: Unremarkable Adrenals/Urinary Tract: The adrenal glands are unremarkable. The kidneys are normal in size and position. Mild bilateral renal cortical scarring. The kidneys are otherwise unremarkable. The bladder is unremarkable. Stomach/Bowel: There are mild pericolonic inflammatory changes involving the distal sigmoid colon likely reflecting changes of mild acute to  subacute diverticulitis. There is superimposed severe sigmoid diverticulosis again noted. No evidence of obstruction or perforation. No pericolonic loculated fluid collections are identified. No free intraperitoneal gas or fluid. The stomach, small bowel, and large bowel are otherwise unremarkable. Appendix normal. Vascular/Lymphatic: No significant vascular findings are present. No enlarged abdominal or pelvic lymph nodes. Reproductive: Status post hysterectomy. No adnexal masses. Other: No abdominal wall hernia. Musculoskeletal: No acute bone abnormality. No lytic or blastic bone lesions. Osseous structures are age-appropriate. IMPRESSION: 1. Mild acute to subacute sigmoid diverticulitis. No evidence of obstruction or perforation. No pericolonic loculated fluid collections. Background severe sigmoid diverticulosis. Electronically Signed   By: Helyn Numbers M.D.   On: 05/03/2023 23:05    Assessment & Plan:   Essential hypertension- Her blood pressure is well-controlled. -     Urinalysis, Routine w reflex microscopic; Future  Chronic kidney disease (CKD) stage G3a/A3, moderately decreased glomerular filtration rate (GFR) between 45-59 mL/min/1.73 square meter and albuminuria creatinine ratio greater than 300 mg/g (HCC) -     Chauncey Mann; Take 1 tablet (10 mg total) by mouth daily.  Dispense: 90 tablet; Refill: 0 -     Microalbumin / creatinine urine ratio; Future -     Urinalysis, Routine w reflex microscopic; Future  Type II diabetes mellitus with complication (HCC) -     Chauncey Mann; Take 1 tablet (10 mg total) by mouth daily.  Dispense: 90 tablet; Refill: 0 -     Ambulatory referral to Ophthalmology  Type 2 diabetes mellitus with stage 3b chronic kidney disease, without long-term current use of insulin (HCC)- Her blood sugar is adequately well-controlled. Chauncey Mann; Take 1 tablet (10 mg total) by mouth daily.  Dispense: 90 tablet; Refill: 0 -     Microalbumin / creatinine urine ratio; Future -      Urinalysis, Routine w reflex microscopic; Future -     Ambulatory referral to Ophthalmology -     HM Diabetes Foot Exam  Dyslipidemia, goal LDL below 100 -     Simvastatin; Take 1 tablet (10 mg total) by mouth daily.  Dispense: 90 tablet; Refill: 1     Follow-up: Return  in about 6 months (around 11/24/2023).  Sanda Linger, MD

## 2023-05-24 NOTE — Telephone Encounter (Signed)
Pharmacy Patient Advocate Encounter  Received notification from [facility] that Prior Authorization for Kerendia 10MG  tablets has been approved effective 04/28/2023 to 10/19/2023  PA #/Case ID/Reference #: [id number]

## 2023-05-24 NOTE — Patient Instructions (Signed)

## 2023-05-31 IMAGING — CT CT ABD-PELV W/ CM
2 of 5 series · 16 of 46 positions shown, 18 images · IV contrast (agent unspecified)
Comparison: None.

CLINICAL DATA: Diffuse abdominal pain with nausea and vomiting.

EXAM:
CT ABDOMEN AND PELVIS WITH CONTRAST
TECHNIQUE: Multidetector CT imaging of the abdomen and pelvis was performed
using the standard protocol following bolus administration of
intravenous contrast.

[Series 2: axial st · axial · 0.88mm/px · z∈[-196,+214]mm · 13 of 96 slices shown, 15 images]
[im 7/96  soft-tissue]
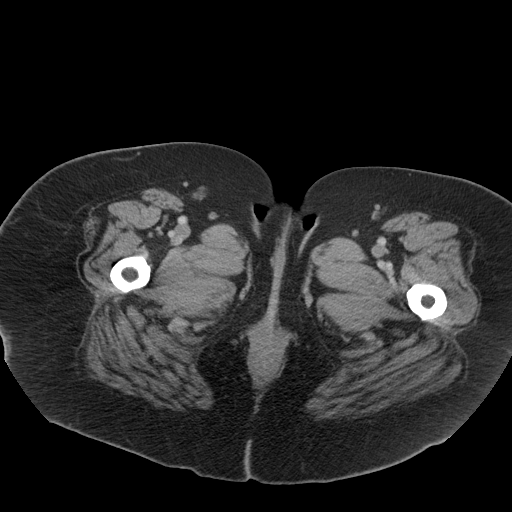
[im 7/96  bone]
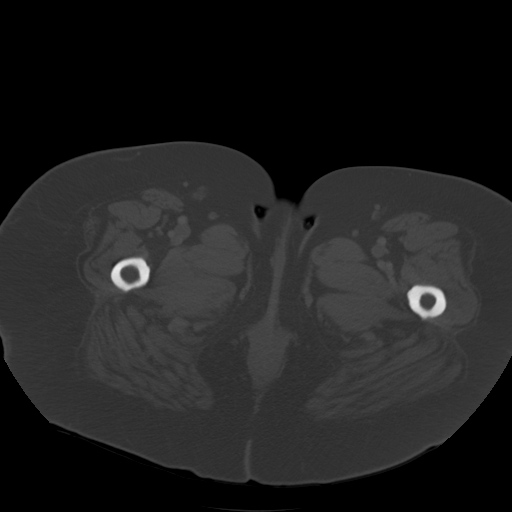
[im 14/96  soft-tissue]
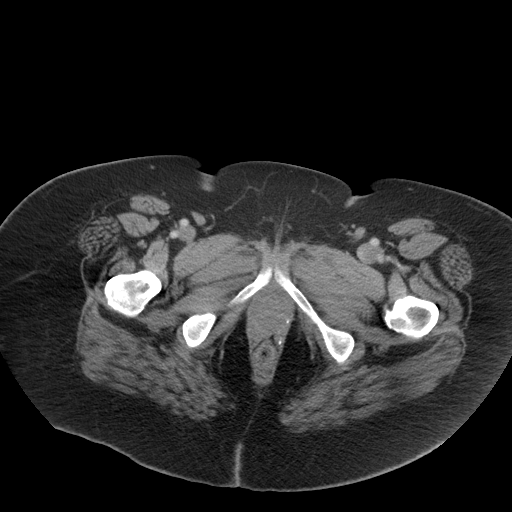
[im 21/96  soft-tissue]
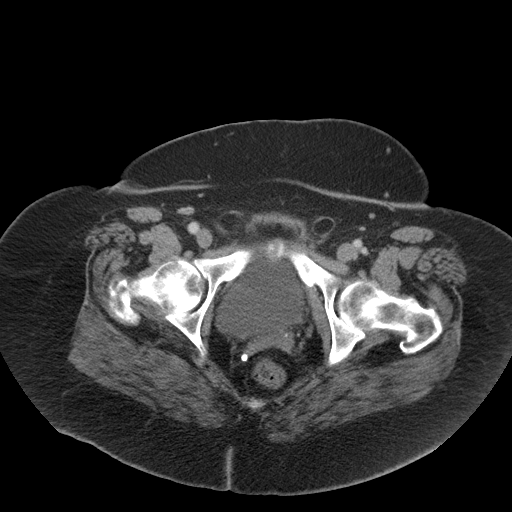
[im 28/96  soft-tissue]
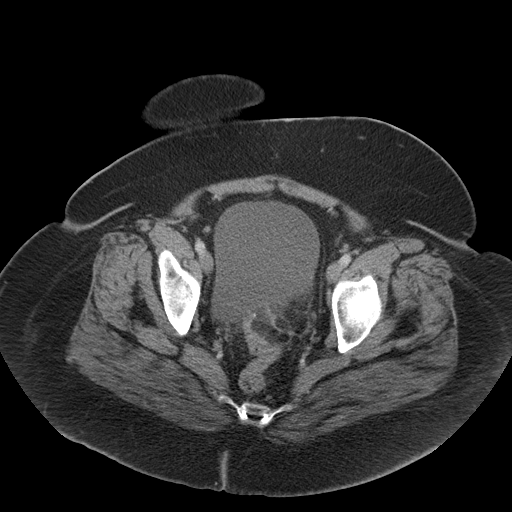
[im 34/96  soft-tissue]
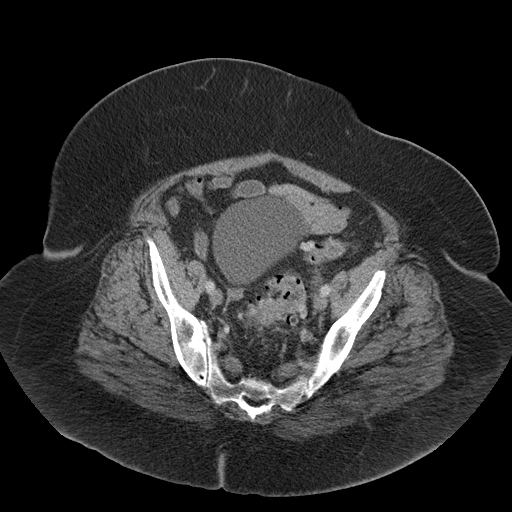
[im 41/96  soft-tissue]
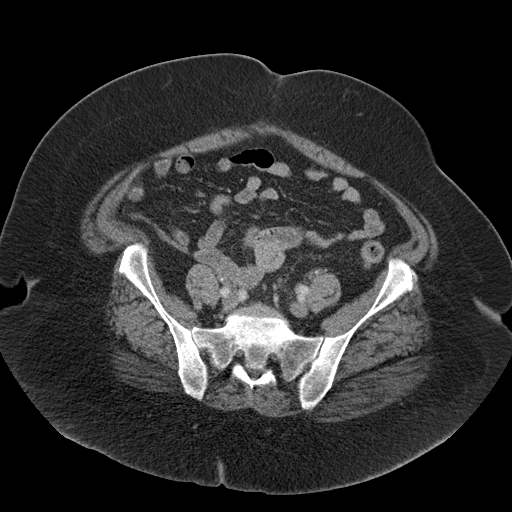
[im 48/96  soft-tissue]
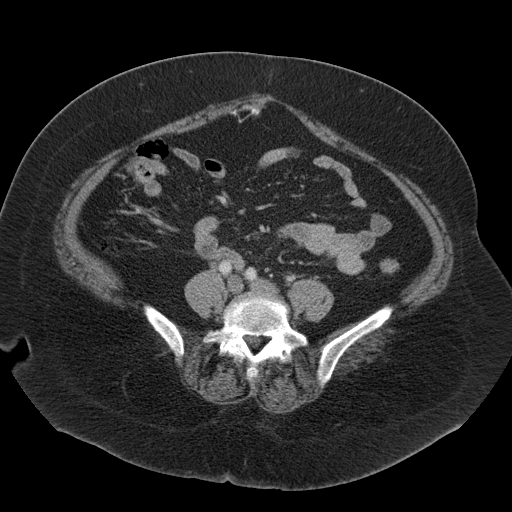
[im 55/96  soft-tissue]
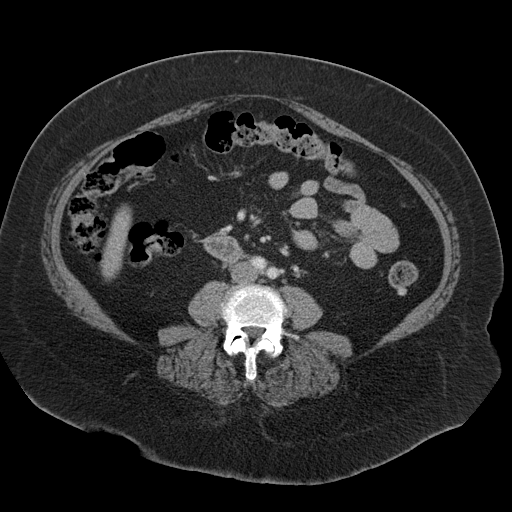
[im 62/96  soft-tissue]
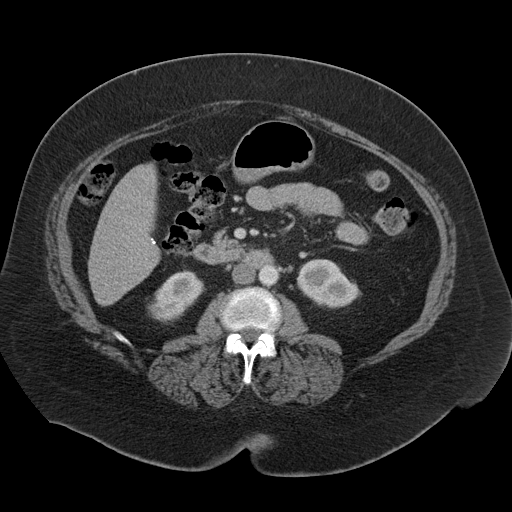
[im 62/96  bone]
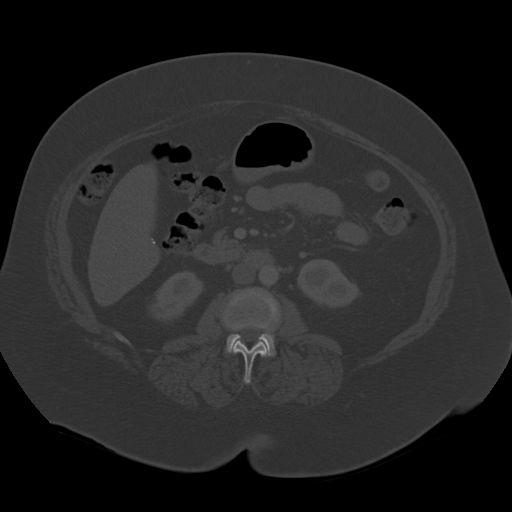
[im 68/96  soft-tissue]
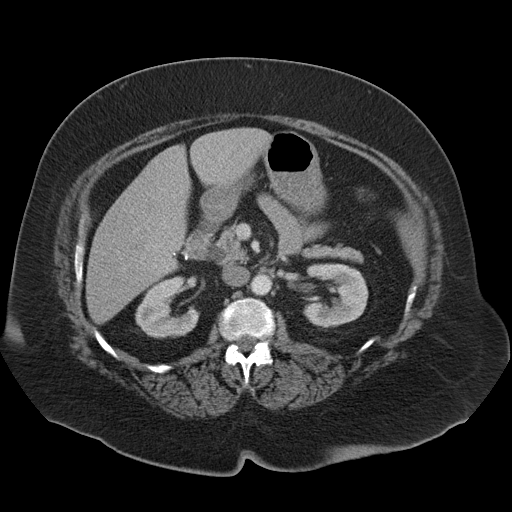
[im 75/96  soft-tissue]
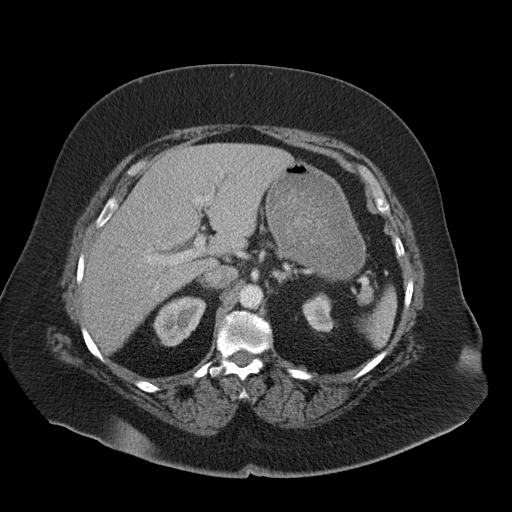
[im 82/96  soft-tissue]
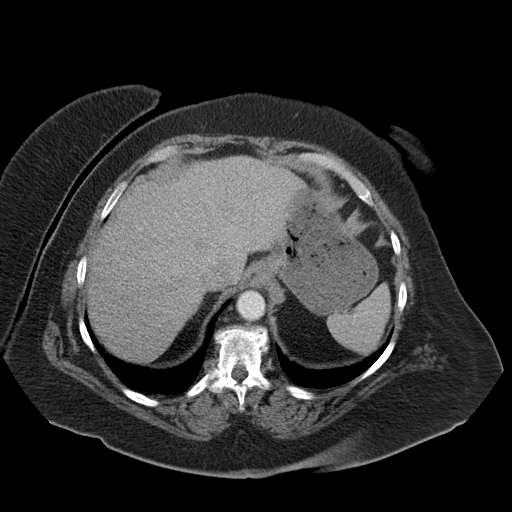
[im 89/96  soft-tissue]
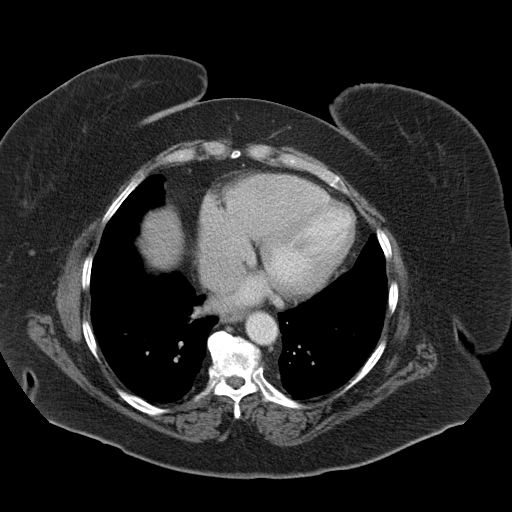

[Series 4: coronal st · coronal · 0.91mm/px · 3 of 187 slices shown]
[im 63/187  soft-tissue]
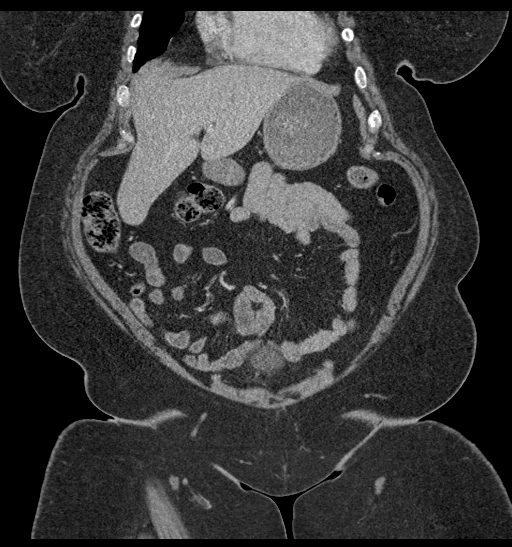
[im 83/187  soft-tissue]
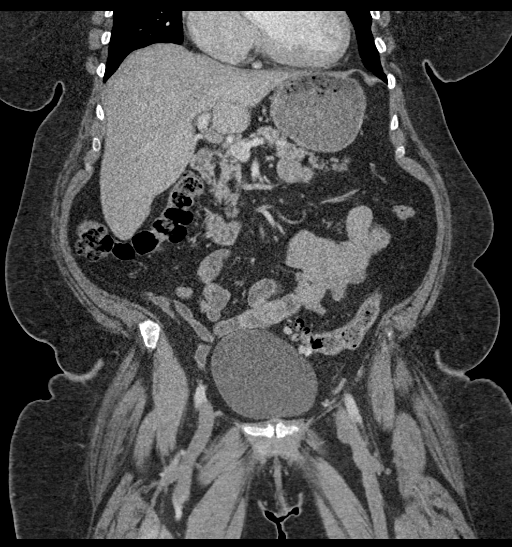
[im 104/187  soft-tissue]
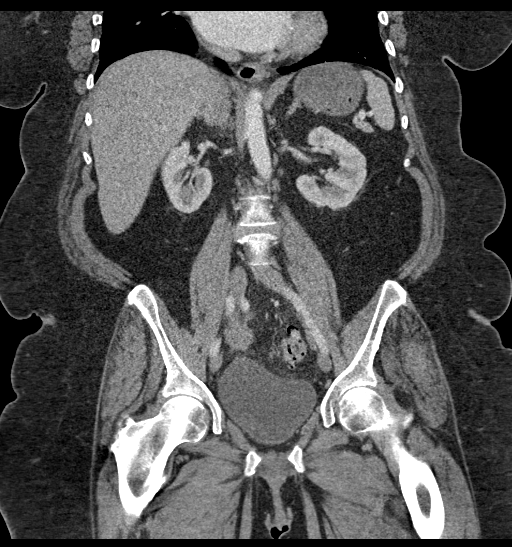

[16 of 46 positions shown; findings below may reference images not displayed]

RADIATION DOSE REDUCTION: This exam was performed according to the
departmental dose-optimization program which includes automated
exposure control, adjustment of the mA and/or kV according to
patient size and/or use of iterative reconstruction technique.

CONTRAST:  100mL OMNIPAQUE IOHEXOL 300 MG/ML  SOLN
FINDINGS: Lower chest: No acute abnormality.

Hepatobiliary: No focal liver abnormality is seen. Status post
cholecystectomy. No biliary dilatation.

Pancreas: Unremarkable. No pancreatic ductal dilatation or
surrounding inflammatory changes.

Spleen: Normal in size without focal abnormality.

Adrenals/Urinary Tract: Adrenal glands are unremarkable. Kidneys are
normal, without renal calculi, focal lesion, or hydronephrosis.
Bladder is unremarkable.

Stomach/Bowel: Stomach is within normal limits. Appendix appears
normal. No evidence of bowel dilatation. Mild to moderately inflamed
diverticula are seen within the mid and distal sigmoid colon.

Vascular/Lymphatic: No significant vascular findings are present. No
enlarged abdominal or pelvic lymph nodes.

Reproductive: Status post hysterectomy. No adnexal masses.

Other: No abdominal wall hernia or abnormality. No abdominopelvic
ascites.

Musculoskeletal: Degenerative changes are seen at the level of
L5-S1.
IMPRESSION: 1. Mild to moderately severity sigmoid diverticulitis.
2. Evidence of prior cholecystectomy and hysterectomy.

## 2023-06-03 NOTE — Progress Notes (Signed)
Spoke to pt and instructed them to come at 0900 and to be NPO after 0000. Confirmed no missed doses of AC and instructed to take in AM with a small sip of water.   Confirmed that pt will have a ride home and someone to stay with them for 24 hours after the procedure.

## 2023-06-04 ENCOUNTER — Encounter (HOSPITAL_COMMUNITY)
Admission: RE | Disposition: A | Discharge status: Home / Self Care | Payer: Self-pay | Source: Home / Self Care | Attending: Cardiovascular Disease

## 2023-06-04 ENCOUNTER — Ambulatory Visit (HOSPITAL_COMMUNITY): Payer: 59 | Admitting: Anesthesiology

## 2023-06-04 ENCOUNTER — Other Ambulatory Visit: Payer: Self-pay

## 2023-06-04 ENCOUNTER — Ambulatory Visit (HOSPITAL_BASED_OUTPATIENT_CLINIC_OR_DEPARTMENT_OTHER): Payer: 59 | Admitting: Anesthesiology

## 2023-06-04 ENCOUNTER — Encounter (HOSPITAL_COMMUNITY): Payer: Self-pay | Admitting: Cardiovascular Disease

## 2023-06-04 ENCOUNTER — Ambulatory Visit (HOSPITAL_COMMUNITY)
Admission: RE | Admit: 2023-06-04 | Discharge: 2023-06-04 | Disposition: A | Discharge status: Home / Self Care | Payer: 59 | Attending: Cardiovascular Disease | Admitting: Cardiovascular Disease

## 2023-06-04 DIAGNOSIS — I4891 Unspecified atrial fibrillation: Secondary | ICD-10-CM | POA: Diagnosis not present

## 2023-06-04 DIAGNOSIS — I5032 Chronic diastolic (congestive) heart failure: Secondary | ICD-10-CM

## 2023-06-04 DIAGNOSIS — I130 Hypertensive heart and chronic kidney disease with heart failure and stage 1 through stage 4 chronic kidney disease, or unspecified chronic kidney disease: Secondary | ICD-10-CM | POA: Diagnosis not present

## 2023-06-04 DIAGNOSIS — I4819 Other persistent atrial fibrillation: Secondary | ICD-10-CM | POA: Insufficient documentation

## 2023-06-04 DIAGNOSIS — E119 Type 2 diabetes mellitus without complications: Secondary | ICD-10-CM | POA: Insufficient documentation

## 2023-06-04 DIAGNOSIS — I5040 Unspecified combined systolic (congestive) and diastolic (congestive) heart failure: Secondary | ICD-10-CM | POA: Insufficient documentation

## 2023-06-04 DIAGNOSIS — N1831 Chronic kidney disease, stage 3a: Secondary | ICD-10-CM

## 2023-06-04 DIAGNOSIS — Z7985 Long-term (current) use of injectable non-insulin antidiabetic drugs: Secondary | ICD-10-CM | POA: Diagnosis not present

## 2023-06-04 DIAGNOSIS — Z7901 Long term (current) use of anticoagulants: Secondary | ICD-10-CM | POA: Diagnosis not present

## 2023-06-04 DIAGNOSIS — E039 Hypothyroidism, unspecified: Secondary | ICD-10-CM | POA: Diagnosis not present

## 2023-06-04 DIAGNOSIS — I110 Hypertensive heart disease with heart failure: Secondary | ICD-10-CM | POA: Diagnosis not present

## 2023-06-04 HISTORY — PX: CARDIOVERSION: SHX1299

## 2023-06-04 LAB — POCT I-STAT, CHEM 8
BUN: 22 mg/dL (ref 8–23)
BUN: 32 mg/dL — ABNORMAL HIGH (ref 8–23)
Calcium, Ion: 1.14 mmol/L — ABNORMAL LOW (ref 1.15–1.40)
Calcium, Ion: 1.28 mmol/L (ref 1.15–1.40)
Chloride: 105 mmol/L (ref 98–111)
Chloride: 105 mmol/L (ref 98–111)
Creatinine, Ser: 1.1 mg/dL — ABNORMAL HIGH (ref 0.44–1.00)
Creatinine, Ser: 1.2 mg/dL — ABNORMAL HIGH (ref 0.44–1.00)
Glucose, Bld: 139 mg/dL — ABNORMAL HIGH (ref 70–99)
Glucose, Bld: 141 mg/dL — ABNORMAL HIGH (ref 70–99)
HCT: 37 % (ref 36.0–46.0)
HCT: 38 % (ref 36.0–46.0)
Hemoglobin: 12.6 g/dL (ref 12.0–15.0)
Hemoglobin: 12.9 g/dL (ref 12.0–15.0)
Potassium: 4 mmol/L (ref 3.5–5.1)
Potassium: 7.3 mmol/L (ref 3.5–5.1)
Sodium: 136 mmol/L (ref 135–145)
Sodium: 140 mmol/L (ref 135–145)
TCO2: 24 mmol/L (ref 22–32)
TCO2: 28 mmol/L (ref 22–32)

## 2023-06-04 SURGERY — CARDIOVERSION
Anesthesia: General

## 2023-06-04 MED ORDER — LIDOCAINE 2% (20 MG/ML) 5 ML SYRINGE
INTRAMUSCULAR | Status: DC | PRN
  Administered 2023-06-04: 100 mg via INTRAVENOUS

## 2023-06-04 MED ORDER — SODIUM CHLORIDE 0.9 % IV SOLN
INTRAVENOUS | Status: DC
  Administered 2023-06-04: 20 mL/h via INTRAVENOUS

## 2023-06-04 MED ORDER — PROPOFOL 10 MG/ML IV BOLUS
INTRAVENOUS | Status: DC | PRN
  Administered 2023-06-04: 100 mg via INTRAVENOUS

## 2023-06-04 SURGICAL SUPPLY — 1 items: ELECT DEFIB PAD ADLT CADENCE (PAD) ×1 IMPLANT

## 2023-06-04 NOTE — CV Procedure (Signed)
   DIRECT CURRENT CARDIOVERSION  NAME:  Stephanie [name]    MRN: [id number] DOB:  September 21, 1951    ADMIT DATE: 06/04/2023  Indication:  Symptomatic atrial fibrillation   Procedure Note:  The patient signed informed consent.  They have had had therapeutic anticoagulation with eliquis greater than 3 weeks.  Anesthesia was administered by Dr. Hyacinth Meeker.  Adequate airway was maintained throughout and vital followed per protocol.  They were cardioverted x 1 with 200J of biphasic synchronized energy.  They converted to NSR.  There were no apparent complications.  The patient had normal neuro status and respiratory status post procedure with vitals stable as recorded elsewhere.    Follow up: They will continue on current medical therapy and follow up with cardiology as scheduled.  Gerri Spore T. Flora Lipps, MD, [facility]  [facility]  [address], [address] [place], [place] [address] [phone]  9:50 AM

## 2023-06-04 NOTE — Anesthesia Preprocedure Evaluation (Signed)
Anesthesia Evaluation    History of Anesthesia Complications (+) PONV and history of anesthetic complications  Airway Mallampati: II  TM Distance: >3 FB Neck ROM: Full    Dental no notable dental hx.    Pulmonary asthma  Sarcoidosis    Pulmonary exam normal breath sounds clear to auscultation       Cardiovascular hypertension, + CAD and +CHF  Normal cardiovascular exam+ dysrhythmias (a fib) Atrial Fibrillation  Rhythm:Regular Rate:Normal     Neuro/Psych  PSYCHIATRIC DISORDERS Anxiety Depression       GI/Hepatic   Endo/Other  diabetes, Type 2, Insulin DependentHypothyroidism  Morbid obesityClass 3 obesity  Renal/GU Renal InsufficiencyRenal disease     Musculoskeletal  (+) Arthritis , Osteoarthritis,    Abdominal  (+) + obese  Peds  Hematology  (+) Blood dyscrasia, anemia   Anesthesia Other Findings   Reproductive/Obstetrics                             Anesthesia Physical Anesthesia Plan  ASA: 3  Anesthesia Plan: General   Post-op Pain Management: Minimal or no pain anticipated   Induction: Intravenous  PONV Risk Score and Plan: 4 or greater and Propofol infusion, TIVA and Treatment may vary due to age or medical condition  Airway Management Planned: Natural Airway and Mask  Additional Equipment:   Intra-op Plan:   Post-operative Plan:   Informed Consent: I have reviewed the patients History and Physical, chart, labs and discussed the procedure including the risks, benefits and alternatives for the proposed anesthesia with the patient or authorized representative who has indicated his/her understanding and acceptance.       Plan Discussed with: CRNA  Anesthesia Plan Comments:         Anesthesia Quick Evaluation

## 2023-06-04 NOTE — H&P (Signed)
Cardiology Admission History and Physical:  Patient ID: Stephanie [name] MRN: [id number] DOB: 1951-05-12  Admit date: 06/04/2023  Primary Care Provider: [name], MD Primary Cardiologist: None  Primary Electrophysiologist:  Sherryl Manges, MD   Chief Complaint:  atrial fibrillation   Patient Profile:  Stephanie [name] is a 72 y.o. female with systolic heart failure, diabetes, persistent atrial fibrillation who presents for cardioversion procedure.  History of Present Illness:  Stephanie [name] recently diagnosed with persistent atrial fibrillation.  Has been on Eliquis since 05/03/2023.  Reports no missed doses.  N.p.o. today.  No signs of worsening heart failure.  Stable for procedure today.  Heart Pathway Score:       Past Medical History: Past Medical History:  Diagnosis Date   Anemia    Ankle fracture, right    x 2   Antineoplastic and immunosuppressive drugs causing adverse effect in therapeutic use    Asthma    Chronic venous hypertension without complications    Colon polyps 2009   Coronary artery disease    no CAD by cath 11.2010   Depressive disorder, not elsewhere classified    Diabetes mellitus type 2 in obese    hgb AIC 6.1 09/02/2009, insulin dependent    Diastolic CHF, chronic (HCC)    reports stable   Diverticulosis of colon 2009   colonoscopy   Gout    cortisone  shots helped   Hepatitis, unspecified    hx of/per pt, never was told of having hepatitis   Hx of hysterectomy 2002   Hyperlipidemia    Hypertension    controlled on medication    Hypothyroidism    reports her thryoid levels are normal now    Irritable bowel syndrome    Lung nodule    12mm RLL nodule on CT Chest  07/2009, resolved by 2011 CT   Noncompliance    Obesity    Persistent atrial fibrillation (HCC)    s/p DCCV 07/2009; Pradaxa Rx, states she is in sinus rhythm now    PONV (postoperative nausea and vomiting)    after receiving "too much anesthesia" after a hernia surgery     Sarcoidosis    dx by eye exam; seen during eye exam , report asymtptomatic "i dont have it now'    Sleep disturbance 06/2013   sleep study, mild abnormality, no criteria for CPAP    Past Surgical History: Past Surgical History:  Procedure Laterality Date   ABDOMINAL HYSTERECTOMY     CARDIOVERSION     x 2   CARDIOVERSION N/A 02/27/2013   Procedure: CARDIOVERSION;  Surgeon: Lewayne Bunting, MD;  Location: [facility] ENDOSCOPY;  Service: Cardiovascular;  Laterality: N/A;   CARDIOVERSION N/A 03/24/2017   Procedure: CARDIOVERSION;  Surgeon: Wendall Stade, MD;  Location: [facility] ENDOSCOPY;  Service: Cardiovascular;  Laterality: N/A;   CARDIOVERSION N/A 03/04/2020   Procedure: CARDIOVERSION;  Surgeon: Wendall Stade, MD;  Location: [facility] ENDOSCOPY;  Service: Cardiovascular;  Laterality: N/A;   CHOLECYSTECTOMY  early 80s   COLONOSCOPY     Diverticulosis, colon polyps, repeat 2014, Dr. Arlyce Dice   COLONOSCOPY WITH PROPOFOL N/A 07/25/2019   Procedure: COLONOSCOPY WITH PROPOFOL;  Surgeon: Benancio Deeds, MD;  Location: [facility] ENDOSCOPY;  Service: Gastroenterology;  Laterality: N/A;   ESOPHAGOGASTRODUODENOSCOPY N/A 05/16/2014   Procedure: ESOPHAGOGASTRODUODENOSCOPY (EGD);  Surgeon: Graylin Shiver, MD;  Location: [facility] ORS;  Service: Gastroenterology;  Laterality: N/A;   HERNIA REPAIR  2009   umbilical hernia   MYOMECTOMY  2000  POLYPECTOMY  07/25/2019   Procedure: POLYPECTOMY;  Surgeon: Benancio Deeds, MD;  Location: [facility] ENDOSCOPY;  Service: Gastroenterology;;     Medications Prior to Admission: Prior to Admission medications   Medication Sig Start Date End Date Taking? Authorizing Provider  allopurinol (ZYLOPRIM) 100 MG tablet TAKE 1/2 TABLET(50 MG) BY MOUTH TWICE DAILY Patient taking differently: Take 50 mg by mouth 2 (two) times daily as needed (gout flares.). 08/27/22  Yes Rodolph Bong, MD  ALPRAZolam Prudy Feeler) 0.5 MG tablet Take 1 tablet (0.5 mg total) by mouth 3 (three) times daily as needed for  anxiety. Patient taking differently: Take 0.5 mg by mouth at bedtime as needed for anxiety or sleep. 04/19/23  Yes [name], MD  apixaban (ELIQUIS) 5 MG TABS tablet Take 1 tablet (5 mg total) by mouth 2 (two) times daily. 05/03/23  Yes Supple, Megan E, RPH-CPP  colchicine 0.6 MG tablet Take 0.6 mg by mouth daily as needed (gout). 12/25/21  Yes [provider]  furosemide (LASIX) 40 MG tablet Take 1 tablet (40 mg total) by mouth daily. 01/11/23  Yes [name], MD  ibuprofen (ADVIL) 200 MG tablet Take 400 mg by mouth every 8 (eight) hours as needed (pain.).   Yes [provider]  JARDIANCE 25 MG TABS tablet TAKE 1 TABLET(25 MG) BY MOUTH DAILY 03/12/23  Yes [name], MD  metoprolol succinate (TOPROL-XL) 25 MG 24 hr tablet TAKE 1 TABLET(25 MG) BY MOUTH DAILY Patient taking differently: Take 25 mg by mouth daily in the afternoon. 04/27/23  Yes Duke Salvia, MD  potassium chloride SA (KLOR-CON M15) 15 MEQ tablet Take 1 tablet (15 mEq total) by mouth 3 (three) times daily. Patient taking differently: Take 15 mEq by mouth in the morning and at bedtime. 02/09/23  Yes [name], MD  sacubitril-valsartan (ENTRESTO) 24-26 MG Take 1 tablet by mouth 2 (two) times daily. 06/18/22  Yes Duke Salvia, MD  sertraline (ZOLOFT) 50 MG tablet Take 1 tablet (50 mg total) by mouth daily. Patient taking differently: Take 50 mg by mouth daily in the afternoon. 04/19/23  Yes [name], MD  simvastatin (ZOCOR) 10 MG tablet Take 1 tablet (10 mg total) by mouth daily. Patient taking differently: Take 10 mg by mouth at bedtime. 05/24/23  Yes [name], MD  terbinafine (LAMISIL) 1 % cream Apply 1 Application topically 2 (two) times daily as needed (skin irritation/itching.).   Yes [provider]  tirzepatide Greggory Keen) 5 MG/0.5ML Pen Inject 5 mg into the skin once a week. Patient taking differently: Inject 5 mg into the skin every Wednesday. 04/19/23  Yes [name], MD   Accu-Chek FastClix Lancets MISC  09/26/18   [provider]  Blood Pressure KIT 1 kit by Does not apply route daily. Check blood pressure once a day 03/11/21   Duke Salvia, MD  dronedarone (MULTAQ) 400 MG tablet TAKE 1 TABLET(400 MG) BY MOUTH TWICE DAILY 05/06/23   Duke Salvia, MD  Finerenone (KERENDIA) 10 MG TABS Take 1 tablet (10 mg total) by mouth daily. 05/24/23   [name], MD     Allergies:    Allergies  Allergen Reactions   Amiodarone Other (See Comments)    Tremor/gait disurbance   Aspirin Other (See Comments)    "Burns my stomach"   Methimazole Nausea And Vomiting   Propoxyphene Nausea And Vomiting   Trintellix [Vortioxetine] Nausea And Vomiting   Fentanyl Nausea And Vomiting  Albuterol Palpitations   Lipitor [Atorvastatin] Other (See Comments)    Body aches and pains--stiffness.    Livalo [Pitavastatin]     Body aches    Social History:   Social History   Socioeconomic History   Marital status: Single    Spouse name: Not on file   Number of children: 5   Years of education: college   Highest education level: Not on file  Occupational History   Occupation: Disabled.  Tobacco Use   Smoking status: Never   Smokeless tobacco: Never   Tobacco comments:    Never smoke 01/27/22  Vaping Use   Vaping status: Never Used  Substance and Sexual Activity   Alcohol use: Yes    Alcohol/week: 0.0 standard drinks of alcohol    Comment: rare   Drug use: No   Sexual activity: Not Currently    Birth control/protection: Surgical  Other Topics Concern   Not on file  Social History Narrative   Pt lives in [place] with her son.  Unemployed.  Right handed.     Education college   Social Determinants of Health   Financial Resource Strain: Low Risk  (05/07/2022)   Overall Financial Resource Strain (CARDIA)    Difficulty of Paying Living Expenses: Not hard at all  Recent Concern: Financial Resource Strain - High Risk (03/30/2022)   Overall Financial  Resource Strain (CARDIA)    Difficulty of Paying Living Expenses: Very hard  Food Insecurity: No Food Insecurity (05/07/2022)   Hunger Vital Sign    Worried About Running Out of Food in the Last Year: Never true    Ran Out of Food in the Last Year: Never true  Transportation Needs: No Transportation Needs (05/07/2022)   PRAPARE - Administrator, Civil Service (Medical): No    Lack of Transportation (Non-Medical): No  Physical Activity: Sufficiently Active (05/07/2022)   Exercise Vital Sign    Days of Exercise per Week: 5 days    Minutes of Exercise per Session: 30 min  Stress: Stress Concern Present (05/07/2022)   [employer] of Occupational Health - Occupational Stress Questionnaire    Feeling of Stress : Very much  Social Connections: Unknown (03/03/2022)   [facility]   Social Network    Social Network: Not on file  Intimate Partner Violence: Not At Risk (05/07/2022)   Humiliation, Afraid, Rape, and Kick questionnaire    Fear of Current or Ex-Partner: No    Emotionally Abused: No    Physically Abused: No    Sexually Abused: No     Family History:   The patient's family history includes Cancer in her father; Emphysema in an other family member; Hypertension in her father; Multiple sclerosis in her mother; Pneumonia in her father.    ROS:  All other ROS reviewed and negative. Pertinent positives noted in the HPI.     Physical Exam/Data:   Vitals:   06/04/23 0856  BP: (!) 135/90  Pulse: 96  Resp: (!) 23  Temp: 97.8 F (36.6 C)  TempSrc: Temporal  SpO2: 98%  Weight: 125.2 kg  Height: 5\' 2"  (1.575 m)   No intake or output data in the 24 hours ending 06/04/23 0905     06/04/2023    8:56 AM 05/24/2023    9:58 AM 05/03/2023    8:11 PM  Last 3 Weights  Weight (lbs) 276 lb 261 lb 268 lb  Weight (kg) 125.193 kg 118.389 kg 121.564 kg  Body mass index is 50.48 kg/m.  General: Well nourished, well developed, in no acute  distress Head: Atraumatic, normal size  Eyes: PEERLA, EOMI  Neck: Supple, no JVD Endocrine: No thryomegaly Cardiac: Normal S1, S2; RRR; no murmurs, rubs, or gallops Lungs: Clear to auscultation bilaterally, no wheezing, rhonchi or rales  Abd: Soft, nontender, no hepatomegaly  Ext: No edema, pulses 2+ Musculoskeletal: No deformities, BUE and BLE strength normal and equal Skin: Warm and dry, no rashes   Neuro: Alert and oriented to person, place, time, and situation, CNII-XII grossly intact, no focal deficits  Psych: Normal mood and affect    Laboratory Data: High Sensitivity Troponin:  No results for input(s): "TROPONINIHS" in the last 720 hours.    Cardiac EnzymesNo results for input(s): "TROPONINI" in the last 168 hours. No results for input(s): "TROPIPOC" in the last 168 hours.  ChemistryNo results for input(s): "NA", "K", "CL", "CO2", "GLUCOSE", "BUN", "CREATININE", "CALCIUM", "GFRNONAA", "GFRAA", "ANIONGAP" in the last 168 hours.  No results for input(s): "PROT", "ALBUMIN", "AST", "ALT", "ALKPHOS", "BILITOT" in the last 168 hours. HematologyNo results for input(s): "WBC", "RBC", "HGB", "HCT", "MCV", "MCH", "MCHC", "RDW", "PLT" in the last 168 hours. BNPNo results for input(s): "BNP", "PROBNP" in the last 168 hours.  DDimer No results for input(s): "DDIMER" in the last 168 hours.  Radiology/Studies:  No results found.  Assessment and Plan:  Persistent atrial fibrillation -N.p.o. for cardioversion today.  On Eliquis greater than 3 weeks.  No missed doses.   Signed, Lenna Gilford. Flora Lipps, MD, [facility] [place]  [facility]  06/04/2023 9:05 AM

## 2023-06-04 NOTE — Discharge Instructions (Signed)

## 2023-06-04 NOTE — Anesthesia Postprocedure Evaluation (Signed)
Anesthesia Post Note  Patient: Stephanie [name]  Procedure(s) Performed: CARDIOVERSION     Patient location during evaluation: PACU Anesthesia Type: General Level of consciousness: awake and alert Pain management: pain level controlled Vital Signs Assessment: post-procedure vital signs reviewed and stable Respiratory status: spontaneous breathing, nonlabored ventilation and respiratory function stable Cardiovascular status: blood pressure returned to baseline and stable Postop Assessment: no apparent nausea or vomiting Anesthetic complications: no   No notable events documented.  Last Vitals:  Vitals:   06/04/23 1010 06/04/23 1020  BP: 116/66 131/86  Pulse: 65 60  Resp: 17 16  Temp:    SpO2: (!) 89% 100%    Last Pain:  Vitals:   06/04/23 1002  TempSrc: Temporal  PainSc: 0-No pain                 Lowella Curb

## 2023-06-04 NOTE — Transfer of Care (Signed)
Immediate Anesthesia Transfer of Care Note  Patient: Stephanie [name]  Procedure(s) Performed: CARDIOVERSION  Patient Location: Cath Lab  Anesthesia Type:General  Level of Consciousness: awake  Airway & Oxygen Therapy: Patient Spontanous Breathing  Post-op Assessment: Report given to RN and Post -op Vital signs reviewed and stable  Post vital signs: Reviewed and stable  Last Vitals:  Vitals Value Taken Time  BP 93/62   Temp    Pulse 81   Resp 12   SpO2 97     Last Pain:  Vitals:   06/04/23 0856  TempSrc: Temporal         Complications: No notable events documented.

## 2023-06-05 ENCOUNTER — Other Ambulatory Visit: Payer: Self-pay | Admitting: Internal Medicine

## 2023-06-07 ENCOUNTER — Encounter (HOSPITAL_COMMUNITY): Payer: Self-pay | Admitting: Cardiovascular Disease

## 2023-06-27 ENCOUNTER — Emergency Department (HOSPITAL_COMMUNITY): Payer: 59

## 2023-06-27 ENCOUNTER — Emergency Department (HOSPITAL_COMMUNITY)
Admission: EM | Admit: 2023-06-27 | Discharge: 2023-06-27 | Disposition: A | Discharge status: Home / Self Care | Payer: 59 | Attending: Emergency Medicine | Admitting: Emergency Medicine

## 2023-06-27 ENCOUNTER — Other Ambulatory Visit: Payer: Self-pay

## 2023-06-27 ENCOUNTER — Encounter (HOSPITAL_COMMUNITY): Payer: Self-pay

## 2023-06-27 DIAGNOSIS — K5792 Diverticulitis of intestine, part unspecified, without perforation or abscess without bleeding: Secondary | ICD-10-CM | POA: Diagnosis not present

## 2023-06-27 DIAGNOSIS — Z9049 Acquired absence of other specified parts of digestive tract: Secondary | ICD-10-CM | POA: Diagnosis not present

## 2023-06-27 DIAGNOSIS — R109 Unspecified abdominal pain: Secondary | ICD-10-CM | POA: Diagnosis not present

## 2023-06-27 DIAGNOSIS — Z7901 Long term (current) use of anticoagulants: Secondary | ICD-10-CM | POA: Diagnosis not present

## 2023-06-27 DIAGNOSIS — K5732 Diverticulitis of large intestine without perforation or abscess without bleeding: Secondary | ICD-10-CM | POA: Insufficient documentation

## 2023-06-27 DIAGNOSIS — R1012 Left upper quadrant pain: Secondary | ICD-10-CM | POA: Diagnosis present

## 2023-06-27 DIAGNOSIS — K5730 Diverticulosis of large intestine without perforation or abscess without bleeding: Secondary | ICD-10-CM | POA: Diagnosis not present

## 2023-06-27 LAB — CBC WITH DIFFERENTIAL/PLATELET
Abs Immature Granulocytes: 0 10*3/uL (ref 0.00–0.07)
Basophils Absolute: 0 10*3/uL (ref 0.0–0.1)
Basophils Relative: 1 %
Eosinophils Absolute: 0 10*3/uL (ref 0.0–0.5)
Eosinophils Relative: 1 %
HCT: 35.1 % — ABNORMAL LOW (ref 36.0–46.0)
Hemoglobin: 11 g/dL — ABNORMAL LOW (ref 12.0–15.0)
Immature Granulocytes: 0 %
Lymphocytes Relative: 34 %
Lymphs Abs: 2 10*3/uL (ref 0.7–4.0)
MCH: 30.7 pg (ref 26.0–34.0)
MCHC: 31.3 g/dL (ref 30.0–36.0)
MCV: 98 fL (ref 80.0–100.0)
Monocytes Absolute: 0.5 10*3/uL (ref 0.1–1.0)
Monocytes Relative: 8 %
Neutro Abs: 3.3 10*3/uL (ref 1.7–7.7)
Neutrophils Relative %: 56 %
Platelets: 192 10*3/uL (ref 150–400)
RBC: 3.58 MIL/uL — ABNORMAL LOW (ref 3.87–5.11)
RDW: 13.9 % (ref 11.5–15.5)
WBC: 5.9 10*3/uL (ref 4.0–10.5)
nRBC: 0 % (ref 0.0–0.2)

## 2023-06-27 LAB — COMPREHENSIVE METABOLIC PANEL
ALT: 9 U/L (ref 0–44)
AST: 11 U/L — ABNORMAL LOW (ref 15–41)
Albumin: 3.6 g/dL (ref 3.5–5.0)
Alkaline Phosphatase: 69 U/L (ref 38–126)
Anion gap: 8 (ref 5–15)
BUN: 17 mg/dL (ref 8–23)
CO2: 23 mmol/L (ref 22–32)
Calcium: 9 mg/dL (ref 8.9–10.3)
Chloride: 108 mmol/L (ref 98–111)
Creatinine, Ser: 0.99 mg/dL (ref 0.44–1.00)
GFR, Estimated: >60 mL/min (ref >60)
Glucose, Bld: 106 mg/dL — ABNORMAL HIGH (ref 70–99)
Potassium: 3.5 mmol/L (ref 3.5–5.1)
Sodium: 139 mmol/L (ref 135–145)
Total Bilirubin: 0.8 mg/dL (ref 0.3–1.2)
Total Protein: 7.4 g/dL (ref 6.5–8.1)

## 2023-06-27 MED ORDER — ONDANSETRON 4 MG PO TBDP: ORAL_TABLET | ORAL | 0 refills | Status: DC

## 2023-06-27 MED ORDER — AMOXICILLIN-POT CLAVULANATE 875-125 MG PO TABS: 1.0000 | ORAL_TABLET | Freq: Two times a day (BID) | ORAL | 0 refills | Status: DC

## 2023-06-27 MED ORDER — SODIUM CHLORIDE 0.9 % IV SOLN
3.0000 g | Freq: Once | INTRAVENOUS | Status: AC
  Administered 2023-06-27: 3 g via INTRAVENOUS
  Filled 2023-06-27: qty 8

## 2023-06-27 MED ORDER — SODIUM CHLORIDE 0.9 % IV BOLUS
1000.0000 mL | Freq: Once | INTRAVENOUS | Status: AC
  Administered 2023-06-27: 1000 mL via INTRAVENOUS

## 2023-06-27 MED ORDER — OXYCODONE-ACETAMINOPHEN 5-325 MG PO TABS: 2.0000 | ORAL_TABLET | Freq: Four times a day (QID) | ORAL | 0 refills | Status: DC | PRN

## 2023-06-27 MED ORDER — IOHEXOL 300 MG/ML  SOLN
100.0000 mL | Freq: Once | INTRAMUSCULAR | Status: AC | PRN
  Administered 2023-06-27: 100 mL via INTRAVENOUS

## 2023-06-27 MED ORDER — HYDROMORPHONE HCL 1 MG/ML IJ SOLN
0.5000 mg | Freq: Once | INTRAMUSCULAR | Status: DC
  Filled 2023-06-27: qty 1

## 2023-06-27 MED ORDER — ONDANSETRON HCL 4 MG/2ML IJ SOLN
4.0000 mg | Freq: Once | INTRAMUSCULAR | Status: DC
  Filled 2023-06-27: qty 2

## 2023-06-27 NOTE — Discharge Instructions (Addendum)
Follow-up with your family doctor this week for recheck.  Return if getting worse.  You can also follow-up with a gastroenterologist if you want to.  You will be referred to [facility] GI

## 2023-06-27 NOTE — ED Provider Notes (Signed)
[place] EMERGENCY DEPARTMENT AT [facility] Provider Note   CSN: [id number] Arrival date & time: 06/27/23  1818     History {Add pertinent medical, surgical, social history, OB history to HPI:1} Chief Complaint  Patient presents with   Abdominal Pain    Stephanie [name] is a 72 y.o. female.  Patient has a history of diverticulitis.  She complains of lower abdominal pain   Abdominal Pain      Home Medications Prior to Admission medications   Medication Sig Start Date End Date Taking? Authorizing Provider  allopurinol (ZYLOPRIM) 100 MG tablet TAKE 1/2 TABLET(50 MG) BY MOUTH TWICE DAILY Patient taking differently: Take 50 mg by mouth 2 (two) times daily as needed (gout flares.). 08/27/22  Yes Rodolph Bong, MD  ALPRAZolam Prudy Feeler) 0.5 MG tablet Take 1 tablet (0.5 mg total) by mouth 3 (three) times daily as needed for anxiety. Patient taking differently: Take 0.5 mg by mouth at bedtime as needed for anxiety or sleep. 04/19/23  Yes [name], MD  amoxicillin-clavulanate (AUGMENTIN) 875-125 MG tablet Take 1 tablet by mouth every 12 (twelve) hours. 06/27/23  Yes Bethann Berkshire, MD  apixaban (ELIQUIS) 5 MG TABS tablet Take 1 tablet (5 mg total) by mouth 2 (two) times daily. 05/03/23  Yes Supple, Megan E, RPH-CPP  colchicine 0.6 MG tablet Take 0.6 mg by mouth daily as needed (gout flares). 12/25/21  Yes [provider]  Finerenone (KERENDIA) 10 MG TABS Take 1 tablet (10 mg total) by mouth daily. 05/24/23  Yes [name], MD  furosemide (LASIX) 40 MG tablet Take 1 tablet (40 mg total) by mouth daily. 01/11/23  Yes [name], MD  ibuprofen (ADVIL) 200 MG tablet Take 400 mg by mouth 2 (two) times daily.   Yes [provider]  JARDIANCE 25 MG TABS tablet TAKE 1 TABLET(25 MG) BY MOUTH DAILY Patient taking differently: Take 25 mg by mouth daily. 03/12/23  Yes [name], MD  metoprolol succinate (TOPROL-XL) 25 MG 24 hr tablet TAKE 1 TABLET(25 MG) BY  MOUTH DAILY Patient taking differently: Take 25 mg by mouth daily in the afternoon. 04/27/23  Yes Duke Salvia, MD  ondansetron (ZOFRAN-ODT) 4 MG disintegrating tablet 4mg  ODT q4 hours prn nausea/vomit 06/27/23  Yes Bethann Berkshire, MD  oxyCODONE-acetaminophen (PERCOCET) 5-325 MG tablet Take 2 tablets by mouth every 6 (six) hours as needed. 06/27/23  Yes Bethann Berkshire, MD  potassium chloride SA (KLOR-CON M15) 15 MEQ tablet Take 1 tablet (15 mEq total) by mouth 3 (three) times daily. Patient taking differently: Take 15 mEq by mouth in the morning and at bedtime. 02/09/23  Yes [name], MD  sacubitril-valsartan (ENTRESTO) 24-26 MG TAKE 1 TABLET BY MOUTH TWICE DAILY 06/07/23  Yes Duke Salvia, MD  simvastatin (ZOCOR) 10 MG tablet Take 1 tablet (10 mg total) by mouth daily. Patient taking differently: Take 10 mg by mouth at bedtime. 05/24/23  Yes [name], MD  terbinafine (LAMISIL) 1 % cream Apply 1 Application topically 2 (two) times daily as needed (skin irritation/itching.).   Yes [provider]  tirzepatide Greggory Keen) 5 MG/0.5ML Pen Inject 5 mg into the skin once a week. Patient taking differently: Inject 5 mg into the skin every Thursday. 04/19/23  Yes [name], MD  Accu-Chek FastClix Lancets MISC  09/26/18   [provider]  Blood Pressure KIT 1 kit by Does not apply route daily. Check blood pressure once a day 03/11/21  Duke Salvia, MD  dronedarone (MULTAQ) 400 MG tablet TAKE 1 TABLET(400 MG) BY MOUTH TWICE DAILY Patient not taking: Reported on 06/27/2023 05/06/23   Duke Salvia, MD  sertraline (ZOLOFT) 50 MG tablet Take 1 tablet (50 mg total) by mouth daily. Patient not taking: Reported on 06/27/2023 04/19/23   [name], MD      Allergies    Amiodarone, Aspirin, Methimazole, Propoxyphene, Trintellix [vortioxetine], Fentanyl, Albuterol, Lipitor [atorvastatin], and Livalo [pitavastatin]    Review of Systems   Review of Systems  Gastrointestinal:   Positive for abdominal pain.    Physical Exam Updated Vital Signs BP 129/66   Pulse 99   Temp 97.9 F (36.6 C) (Oral)   Resp 16   Ht 5\' 2"  (1.575 m)   Wt 124.7 kg   SpO2 99%   BMI 50.30 kg/m  Physical Exam  ED Results / Procedures / Treatments   Labs (all labs ordered are listed, but only abnormal results are displayed) Labs Reviewed  CBC WITH DIFFERENTIAL/PLATELET - Abnormal; Notable for the following components:      Result Value   RBC 3.58 (*)    Hemoglobin 11.0 (*)    HCT 35.1 (*)    All other components within normal limits  COMPREHENSIVE METABOLIC PANEL - Abnormal; Notable for the following components:   Glucose, Bld 106 (*)    AST 11 (*)    All other components within normal limits  URINALYSIS, ROUTINE W REFLEX MICROSCOPIC    EKG None  Radiology CT ABDOMEN PELVIS W CONTRAST  Result Date: 06/27/2023 CLINICAL DATA:  Acute abdominal pain EXAM: CT ABDOMEN AND PELVIS WITH CONTRAST TECHNIQUE: Multidetector CT imaging of the abdomen and pelvis was performed using the standard protocol following bolus administration of intravenous contrast. RADIATION DOSE REDUCTION: This exam was performed according to the departmental dose-optimization program which includes automated exposure control, adjustment of the mA and/or kV according to patient size and/or use of iterative reconstruction technique. CONTRAST:  OMNIPAQUE IOHEXOL 300 MG/ML  SOLN COMPARISON:  CT abdomen and pelvis 05/04/2023 FINDINGS: Lower chest: No acute abnormality. Hepatobiliary: No focal liver abnormality is seen. Status post cholecystectomy. No biliary dilatation. Pancreas: Unremarkable. No pancreatic ductal dilatation or surrounding inflammatory changes. Spleen: Normal in size without focal abnormality. Adrenals/Urinary Tract: There some scarring in the superior pole the left kidney. Otherwise, the kidneys, adrenal glands, and bladder are within normal limits. Stomach/Bowel: There is sigmoid colon  diverticulosis. There is wall thickening of the mid sigmoid colon with surrounding inflammation worrisome for acute diverticulitis. There is no perforation or abscess identified. There is a questionable tract seen on the sagittal view between this inflamed edge of the colon in the vaginal cuff. There is no air in this tract. There is no air in the vaginal cuff. There is no bowel obstruction. The appendix and stomach are within normal limits. Vascular/Lymphatic: No significant vascular findings are present. No enlarged abdominal or pelvic lymph nodes. Reproductive: Status post hysterectomy. No adnexal masses. Other: No abdominal wall hernia or abnormality. No abdominopelvic ascites. Anterior abdominal wall mesh is noted at the level of the umbilicus, unchanged. Musculoskeletal: No acute or significant osseous findings. IMPRESSION: 1. Findings compatible with acute sigmoid colon diverticulitis. No perforation or abscess. 2. Questionable tract between the inflamed sigmoid colon and vaginal cuff. No air in this tract. No air in the vaginal cuff. Please correlate clinically. 3. Status post cholecystectomy and hysterectomy. Electronically Signed   By: Darliss Cheney M.D.   On:  06/27/2023 22:54    Procedures Procedures  {Document cardiac monitor, telemetry assessment procedure when appropriate:1}  Medications Ordered in ED Medications  ondansetron (ZOFRAN) injection 4 mg (4 mg Intravenous Not Given 06/27/23 2113)  HYDROmorphone (DILAUDID) injection 0.5 mg (0.5 mg Intravenous Not Given 06/27/23 2113)  Ampicillin-Sulbactam (UNASYN) 3 g in sodium chloride 0.9 % 100 mL IVPB (has no administration in time range)  sodium chloride 0.9 % bolus 1,000 mL (1,000 mLs Intravenous New Bag/Given 06/27/23 2115)  iohexol (OMNIPAQUE) 300 MG/ML solution 100 mL (100 mLs Intravenous Contrast Given 06/27/23 2222)    ED Course/ Medical Decision Making/ A&P   {   Click here for ABCD2, HEART and other calculatorsREFRESH Note before  signing :1}                              Medical Decision Making Amount and/or Complexity of Data Reviewed Labs: ordered. Radiology: ordered.  Risk Prescription drug management.   Patient with diverticulitis.  She is started on Augmentin and will follow-up with her PCP  {Document critical care time when appropriate:1} {Document review of labs and clinical decision tools ie heart score, Chads2Vasc2 etc:1}  {Document your independent review of radiology images, and any outside records:1} {Document your discussion with family members, caretakers, and with consultants:1} {Document social determinants of health affecting pt's care:1} {Document your decision making why or why not admission, treatments were needed:1} Final Clinical Impression(s) / ED Diagnoses Final diagnoses:  Diverticulitis    Rx / DC Orders ED Discharge Orders          Ordered    amoxicillin-clavulanate (AUGMENTIN) 875-125 MG tablet  Every 12 hours        06/27/23 2307    ondansetron (ZOFRAN-ODT) 4 MG disintegrating tablet        06/27/23 2307    oxyCODONE-acetaminophen (PERCOCET) 5-325 MG tablet  Every 6 hours PRN        06/27/23 2307

## 2023-06-30 NOTE — Progress Notes (Unsigned)
Cardiology Office Note Date:  06/30/2023  Patient ID:  Stephanie [name], Stephanie [name] 1950/12/06, MRN [id number] PCP:  [name], MD  Electrophysiologist: Dr. Graciela Husbands Neuology: Dr. Vickey Huger Gastroenterology: Dr. Arlyce Dice   Chief Complaint: *** 2 mo  History of Present Illness: Stephanie [name] is a 72 y.o. female with history of PAFib, sarcoidosis (eye), IBS, HLD, No obst CAD by cath in 2010, recurrent NICM felt 2/2 tachycardia/AF.  At her visit with Dr. Graciela Husbands 04/26/23, mentions AFib had progressed to persistent, had missed some OAC doses > planned for DCCV once back on for one more attempt at rhythm control, not clearly symptomatic anymore with ehr AFib (Note her 48 y/o grandson had recently died)  June 12, 2023: DCCV > SR  *** pradaxa, compliance, dose, labs, bleeding *** sinus? *** if not, rate control? *** symptoms in sinus vs AF *** volume *** CM meds   Her AF/AAD history: --DCCV 2010, May 2014 with raid return of AF then sponatenous conversion to SR --March 2015 discussed with Dr. Johney Frame possible ablation but did not want to persue this, her amio was reduced to 200mg  daily and recommended lifestyle adjustments --Amiodarone ultimately stopped with concerns of possible toxicity with neuro symptoms of staggering/tremor with improvement off as well as hyperthyroidism  -- CM felt to be AF/rate related 25% IN 2012, that is improved to 50-55% in 2014>> Dec 2017 again 25% in 2018 -- DCCV (x2 shocks) 03/24/2017 -- Mild abnormal sleep study no indication for CPAP 2014 DCCV 2021 Dec 2021  started on Tikosyn, load stopped 2/2 QT prolongation Dec 2021 Multaq started   Past Medical History:  Diagnosis Date   Anemia    Ankle fracture, right    x 2   Antineoplastic and immunosuppressive drugs causing adverse effect in therapeutic use    Asthma    Chronic venous hypertension without complications    Colon polyps 2009   Coronary artery disease    no CAD by cath 11.2010   Depressive disorder, not  elsewhere classified    Diabetes mellitus type 2 in obese    hgb AIC 6.1 09/02/2009, insulin dependent    Diastolic CHF, chronic (HCC)    reports stable   Diverticulosis of colon 2009   colonoscopy   Gout    cortisone  shots helped   Hepatitis, unspecified    hx of/per pt, never was told of having hepatitis   Hx of hysterectomy 2002   Hyperlipidemia    Hypertension    controlled on medication    Hypothyroidism    reports her thryoid levels are normal now    Irritable bowel syndrome    Lung nodule    12mm RLL nodule on CT Chest  07/2009, resolved by 2011 CT   Noncompliance    Obesity    Persistent atrial fibrillation (HCC)    s/p DCCV 07/2009; Pradaxa Rx, states she is in sinus rhythm now    PONV (postoperative nausea and vomiting)    after receiving "too much anesthesia" after a hernia surgery    Sarcoidosis    dx by eye exam; seen during eye exam , report asymtptomatic "i dont have it now'    Sleep disturbance 06/2013   sleep study, mild abnormality, no criteria for CPAP    Past Surgical History:  Procedure Laterality Date   ABDOMINAL HYSTERECTOMY     CARDIOVERSION     x 2   CARDIOVERSION N/A 02/27/2013   Procedure: CARDIOVERSION;  Surgeon: Lewayne Bunting, MD;  Location: [facility] ENDOSCOPY;  Service: Cardiovascular;  Laterality: N/A;   CARDIOVERSION N/A 03/24/2017   Procedure: CARDIOVERSION;  Surgeon: Wendall Stade, MD;  Location: [facility] ENDOSCOPY;  Service: Cardiovascular;  Laterality: N/A;   CARDIOVERSION N/A 03/04/2020   Procedure: CARDIOVERSION;  Surgeon: Wendall Stade, MD;  Location: [facility] ENDOSCOPY;  Service: Cardiovascular;  Laterality: N/A;   CARDIOVERSION N/A 06/04/2023   Procedure: CARDIOVERSION;  Surgeon: Sande Rives, MD;  Location: [facility] INVASIVE CV LAB;  Service: Cardiovascular;  Laterality: N/A;   CHOLECYSTECTOMY  early 80s   COLONOSCOPY     Diverticulosis, colon polyps, repeat 2014, Dr. Arlyce Dice   COLONOSCOPY WITH PROPOFOL N/A 07/25/2019   Procedure:  COLONOSCOPY WITH PROPOFOL;  Surgeon: Benancio Deeds, MD;  Location: [facility] ENDOSCOPY;  Service: Gastroenterology;  Laterality: N/A;   ESOPHAGOGASTRODUODENOSCOPY N/A 05/16/2014   Procedure: ESOPHAGOGASTRODUODENOSCOPY (EGD);  Surgeon: Graylin Shiver, MD;  Location: [facility] ORS;  Service: Gastroenterology;  Laterality: N/A;   HERNIA REPAIR  2009   umbilical hernia   MYOMECTOMY  2000   POLYPECTOMY  07/25/2019   Procedure: POLYPECTOMY;  Surgeon: Benancio Deeds, MD;  Location: [facility] ENDOSCOPY;  Service: Gastroenterology;;    Current Outpatient Medications  Medication Sig Dispense Refill   Accu-Chek FastClix Lancets MISC      allopurinol (ZYLOPRIM) 100 MG tablet TAKE 1/2 TABLET(50 MG) BY MOUTH TWICE DAILY (Patient taking differently: Take 50 mg by mouth 2 (two) times daily as needed (gout flares.).) 90 tablet 3   ALPRAZolam (XANAX) 0.5 MG tablet Take 1 tablet (0.5 mg total) by mouth 3 (three) times daily as needed for anxiety. (Patient taking differently: Take 0.5 mg by mouth at bedtime as needed for anxiety or sleep.) 270 tablet 0   amoxicillin-clavulanate (AUGMENTIN) 875-125 MG tablet Take 1 tablet by mouth every 12 (twelve) hours. 20 tablet 0   apixaban (ELIQUIS) 5 MG TABS tablet Take 1 tablet (5 mg total) by mouth 2 (two) times daily. 60 tablet 5   Blood Pressure KIT 1 kit by Does not apply route daily. Check blood pressure once a day 1 kit 0   colchicine 0.6 MG tablet Take 0.6 mg by mouth daily as needed (gout flares).     dronedarone (MULTAQ) 400 MG tablet TAKE 1 TABLET(400 MG) BY MOUTH TWICE DAILY (Patient not taking: Reported on 06/27/2023) 60 tablet 11   Finerenone (KERENDIA) 10 MG TABS Take 1 tablet (10 mg total) by mouth daily. 90 tablet 0   furosemide (LASIX) 40 MG tablet Take 1 tablet (40 mg total) by mouth daily. 90 tablet 1   ibuprofen (ADVIL) 200 MG tablet Take 400 mg by mouth 2 (two) times daily.     JARDIANCE 25 MG TABS tablet TAKE 1 TABLET(25 MG) BY MOUTH DAILY (Patient taking  differently: Take 25 mg by mouth daily.) 90 tablet 1   metoprolol succinate (TOPROL-XL) 25 MG 24 hr tablet TAKE 1 TABLET(25 MG) BY MOUTH DAILY (Patient taking differently: Take 25 mg by mouth daily in the afternoon.) 90 tablet 3   ondansetron (ZOFRAN-ODT) 4 MG disintegrating tablet 4mg  ODT q4 hours prn nausea/vomit 12 tablet 0   oxyCODONE-acetaminophen (PERCOCET) 5-325 MG tablet Take 2 tablets by mouth every 6 (six) hours as needed. 20 tablet 0   potassium chloride SA (KLOR-CON M15) 15 MEQ tablet Take 1 tablet (15 mEq total) by mouth 3 (three) times daily. (Patient taking differently: Take 15 mEq by mouth in the morning and at bedtime.) 270 tablet 0   sacubitril-valsartan (ENTRESTO) 24-26 MG TAKE 1 TABLET BY MOUTH  TWICE DAILY 180 tablet 3   sertraline (ZOLOFT) 50 MG tablet Take 1 tablet (50 mg total) by mouth daily. (Patient not taking: Reported on 06/27/2023) 90 tablet 0   simvastatin (ZOCOR) 10 MG tablet Take 1 tablet (10 mg total) by mouth daily. (Patient taking differently: Take 10 mg by mouth at bedtime.) 90 tablet 1   terbinafine (LAMISIL) 1 % cream Apply 1 Application topically 2 (two) times daily as needed (skin irritation/itching.).     tirzepatide (MOUNJARO) 5 MG/0.5ML Pen Inject 5 mg into the skin once a week. (Patient taking differently: Inject 5 mg into the skin every Thursday.) 6 mL 0   No current facility-administered medications for this visit.    Allergies:   Amiodarone, Aspirin, Methimazole, Propoxyphene, Trintellix [vortioxetine], Fentanyl, Albuterol, Lipitor [atorvastatin], and Livalo [pitavastatin]   Social History:  The patient  reports that she has never smoked. She has never used smokeless tobacco. She reports current alcohol use. She reports that she does not use drugs.   Family History:  The patient's family history includes Cancer in her father; Emphysema in an other family member; Hypertension in her father; Multiple sclerosis in her mother; Pneumonia in her  father.  ROS:  Please see the history of present illness.   All other systems are reviewed and otherwise negative.   PHYSICAL EXAM:  VS:  There were no vitals taken for this visit. BMI: There is no height or weight on file to calculate BMI. Very plesant BF, obese, in no acute distress  HEENT: normocephalic, atraumatic  Neck: no JVD, carotid bruits or masses Cardiac: *** irreg-irreg,  no significant murmurs, no rubs, or gallops Lungs: *** CTA b/l, no wheezing, rhonchi or rales  Abd: soft, nontender, obese MS: no deformity or atrophy Ext: *** trace edema b/l LE  Skin: warm and dry, no rash Neuro:  No gross deficits appreciated Psych: euthymic mood, full affect     EKG:  Done today and reviewed by myself: ***  11/14/20: TTE 1. Left ventricular ejection fraction, by estimation, is 40 to 45%. The  left ventricle has mildly decreased function. Left ventricular endocardial  border not optimally defined to evaluate regional wall motion. There is  moderate left ventricular  hypertrophy. Left ventricular diastolic parameters are indeterminate.   2. Right ventricular systolic function was not well visualized. The right  ventricular size is normal. Tricuspid regurgitation signal is inadequate  for assessing PA pressure.   3. Left atrial size was mild to moderately dilated.   4. The mitral valve is normal in structure. No evidence of mitral valve  regurgitation. No evidence of mitral stenosis.   5. The aortic valve was not well visualized. Aortic valve regurgitation  is not visualized. Mild aortic valve sclerosis is present, with no  evidence of aortic valve stenosis.   6. The inferior vena cava is normal in size with greater than 50%  respiratory variability, suggesting right atrial pressure of 3 mmHg.   7. Technically difficult study with poor acoustic windows.   2021: LVEF 45-50%  09/29/2018: TTE Study Conclusions - Left ventricle: The cavity size was normal. Wall thickness was    increased in a pattern of mild LVH. Systolic function was normal.   The estimated ejection fraction was in the range of 55% to 60%.   Wall motion was normal; there were no regional wall motion   abnormalities. Features are consistent with a pseudonormal left   ventricular filling pattern, with concomitant abnormal relaxation   and increased filling  pressure (grade 2 diastolic dysfunction).  03/03/2018: TTE Study Conclusions - Left ventricle: The cavity size was normal. There was moderate   concentric hypertrophy. Systolic function was severely reduced.   The estimated ejection fraction was in the range of 15% to 20%.   Diffuse hypokinesis. - Aortic valve: There was no regurgitation. - Aortic root: The aortic root was normal in size. - Mitral valve: There was mild regurgitation. - Left atrium: The atrium was moderately dilated. - Right ventricle: Systolic function was normal. - Right atrium: The atrium was mildly dilated. - Tricuspid valve: There was mild regurgitation. - Pulmonary arteries: Systolic pressure was within the normal   range. - Inferior vena cava: The vessel was normal in size. - Pericardium, extracardiac: There was no pericardial effusion.   Impressions: - Images with echocontrast show no thrombus in the left ventricle   but there is significant smoke.     10/18/16 TTE Study Conclusions - Left ventricle: The cavity size was mildly dilated. There was   moderate concentric hypertrophy. Systolic function was severely   reduced. The estimated ejection fraction was 25%. Diffuse   hypokinesis. The study is not technically sufficient to allow   evaluation of LV diastolic function. - Mitral valve: Calcified annulus. Mildly thickened leaflets .   There was mild regurgitation. - Left atrium: The atrium was severely dilated. (47mm) - Right ventricle: Systolic function was moderately reduced. - Right atrium: The atrium was mildly dilated. - Tricuspid valve: There was mild  regurgitation.   05/29/13: Echocardiogram Study Conclusions Left ventricle: LVEF is approximately 50 to 55% with very mild inferior and septal hypokinesis  02/08/13: Echocardiogram Study Conclusions - Left ventricle: The cavity size was mildly dilated. Wall   thickness was increased in a pattern of moderate LVH.   Systolic function was severely reduced. The estimated   ejection fraction was in the range of 25% to 30%. - Left atrium: The atrium was mildly dilated.  Recent Labs: 04/19/2023: TSH 3.28 06/27/2023: ALT 9; BUN 17; Creatinine, Ser 0.99; Hemoglobin 11.0; Platelets 192; Potassium 3.5; Sodium 139  04/19/2023: Cholesterol 208; HDL 59.70; LDL Cholesterol 125; Total CHOL/HDL Ratio 3; Triglycerides 117.0; VLDL 23.4   Estimated Creatinine Clearance: 65.7 mL/min (by C-G formula based on SCr of 0.99 mg/dL).   Wt Readings from Last 3 Encounters:  06/27/23 275 lb (124.7 kg)  06/04/23 276 lb (125.2 kg)  05/24/23 261 lb (118.4 kg)     Other studies reviewed: Additional studies/records reviewed today include: summarized above  ASSESSMENT AND PLAN:  1. Persistent AFib     CHADS2Vasc is at least *** 3 on Pradaxa, *** appropriately dosed by last labs     2. HTN     *** No changes today     Discussed diet, exercise weight loss   3. NICM     40-45% by her last echo     *** On BB/Entresto, diuretic    *** No clear exam findings of volume OL currently         Disposition: ***   Current medicines are reviewed at length with the patient today.  The patient did not have any concerns regarding medicines.  Judith Blonder, PA-C 06/30/2023 4:23 PM     [facility] [address] [place] [address] [phone] (office)  [phone] (fax)

## 2023-07-01 ENCOUNTER — Ambulatory Visit: Payer: 59 | Attending: Physician Assistant | Admitting: Physician Assistant

## 2023-07-01 ENCOUNTER — Encounter: Payer: Self-pay | Admitting: Physician Assistant

## 2023-07-01 VITALS — BP 126/74 | HR 60 | Ht 63.0 in | Wt 259.8 lb

## 2023-07-01 DIAGNOSIS — I10 Essential (primary) hypertension: Secondary | ICD-10-CM | POA: Diagnosis not present

## 2023-07-01 DIAGNOSIS — I4819 Other persistent atrial fibrillation: Secondary | ICD-10-CM | POA: Diagnosis not present

## 2023-07-01 DIAGNOSIS — D6869 Other thrombophilia: Secondary | ICD-10-CM

## 2023-07-01 DIAGNOSIS — I428 Other cardiomyopathies: Secondary | ICD-10-CM | POA: Diagnosis not present

## 2023-07-01 NOTE — Patient Instructions (Signed)
Medication Instructions:   Your physician recommends that you continue on your current medications as directed. Please refer to the Current Medication list given to you today.  *If you need a refill on your cardiac medications before your next appointment, please call your pharmacy*   Lab Work: NONE ORDERED  TODAY   If you have labs (blood work) drawn today and your tests are completely normal, you will receive your results only by: MyChart Message (if you have MyChart) OR A paper copy in the mail If you have any lab test that is abnormal or we need to change your treatment, we will call you to review the results.   Testing/Procedures: NONE ORDERED  TODAY     Follow-Up: At [facility], you and your health needs are our priority.  As part of our continuing mission to provide you with exceptional heart care, we have created designated Provider Care Teams.  These Care Teams include your primary Cardiologist (physician) and Advanced Practice Providers (APPs -  Physician Assistants and Nurse Practitioners) who all work together to provide you with the care you need, when you need it.  We recommend signing up for the patient portal called "MyChart".  Sign up information is provided on this After Visit Summary.  MyChart is used to connect with patients for Virtual Visits (Telemedicine).  Patients are able to view lab/test results, encounter notes, upcoming appointments, etc.  Non-urgent messages can be sent to your provider as well.   To learn more about what you can do with MyChart, go to [email or web address].    Your next appointment:  August 06 2023   1 month(s)  Provider:   Casimiro Needle "Otilio Saber, [place]    Other Instructions

## 2023-07-05 ENCOUNTER — Telehealth: Payer: Self-pay | Admitting: *Deleted

## 2023-07-05 NOTE — Progress Notes (Unsigned)
Care Coordination  Outreach Note  07/05/2023 Name: Jerita Charania MRN: [id number] DOB: 05/10/1951   Care Coordination Outreach Attempts: An unsuccessful telephone outreach was attempted today to offer the patient information about available care coordination services.  Follow Up Plan:  Additional outreach attempts will be made to offer the patient care coordination information and services.   Encounter Outcome:  No Answer  Burman Nieves, CCMA Care Coordination Care Guide Direct Dial: [phone]

## 2023-07-06 NOTE — Progress Notes (Unsigned)
Care Coordination  Outreach Note  07/06/2023 Name: Stephanie [name] MRN: [id number] DOB: Dec 19, 1950   Care Coordination Outreach Attempts: A second unsuccessful outreach was attempted today to offer the patient with information about available care coordination services.  Follow Up Plan:  Additional outreach attempts will be made to offer the patient care coordination information and services.   Encounter Outcome:  No Answer  Burman Nieves, CCMA Care Coordination Care Guide Direct Dial: [phone]

## 2023-07-07 NOTE — Progress Notes (Signed)
Care Coordination   Note   07/07/2023 Name: Twilla Ishida MRN: [id number] DOB: 1951/08/21  Janah Ethridge is a 72 y.o. year old female who sees [name], MD for primary care. I reached out to [name] by phone today to offer care coordination services.  Ms. Linko was given information about Care Coordination services today including:   The Care Coordination services include support from the care team which includes your Nurse Coordinator, Clinical Social Worker, or Pharmacist.  The Care Coordination team is here to help remove barriers to the health concerns and goals most important to you. Care Coordination services are voluntary, and the patient may decline or stop services at any time by request to their care team member.   Care Coordination Consent Status: Patient agreed to services and verbal consent obtained.   Follow up plan:  Telephone appointment with care coordination team member scheduled for:  07/12/2023  Encounter Outcome:  Patient Scheduled from referral   Burman Nieves, [facility] Care Coordination Care Guide Direct Dial: [phone]

## 2023-07-12 ENCOUNTER — Ambulatory Visit: Payer: Self-pay | Admitting: Licensed Clinical Social Worker

## 2023-07-13 NOTE — Patient Outreach (Signed)
Care Coordination   Initial Visit Note   07/12/2023 Name: Stephanie [name] MRN: [id number] DOB: [birth year]  Stephanie [name] is a 72 y.o. year old female who sees [name], MD for primary care. I spoke with  [name] by phone today.  What matters to the patients health and wellness today?  Symptom Management    Goals Addressed             This Visit's Progress    LCSW Obtain Supportive Resources   On track    Activities and task to complete in order to accomplish goals.   Keep all upcoming appointments discussed today Continue with compliance of taking medication prescribed by Doctor Implement healthy coping skills discussed to assist with management of symptoms F/up with Gastrologist, per ED recommendations. Continue tracking symptoms to assist in tx planning          SDOH assessments and interventions completed:  Yes  SDOH Interventions Today    Flowsheet Row Most Recent Value  SDOH Interventions   Food Insecurity Interventions Intervention Not Indicated  Housing Interventions Intervention Not Indicated  Transportation Interventions SCAT (Specialized Community Area [employer]poration), Patient Resources (Friends/Family), Payor Benefit        Care Coordination Interventions:  Yes, provided  Interventions Today    Flowsheet Row Most Recent Value  Chronic Disease   Chronic disease during today's visit Diabetes, Hypertension (HTN), Atrial Fibrillation (AFib), Chronic Kidney Disease/End Stage Renal Disease (ESRD), Other  [GAD and MDD]  General Interventions   General Interventions Discussed/Reviewed General Interventions Discussed, Programmer, applications, Doctor Visits  [SDOH assessments completed.]  Doctor Visits Discussed/Reviewed Doctor Visits Discussed, Specialist  Exercise Interventions   Exercise Discussed/Reviewed Exercise Discussed  Mental Health Interventions   Mental Health Discussed/Reviewed Mental Health Discussed, Depression, Coping Strategies,  Anxiety  [Patient receives support from family and friends. Enjoys spending time at local park to assist with management of symptoms. Additional healthy coping skills discussed, including advocating for self with specialists regarding tx planning]  Nutrition Interventions   Nutrition Discussed/Reviewed Nutrition Discussed  Pharmacy Interventions   Pharmacy Dicussed/Reviewed Pharmacy Topics Discussed, Medication Adherence  Safety Interventions   Safety Discussed/Reviewed Safety Discussed       Follow up plan: Follow up call scheduled for 2-4 weeks    Encounter Outcome:  Patient Visit Completed   [name], MSW, LCSW [facility] Care Management [facility]  [facility] [email or web address] Phone [phone] 10:30 AM

## 2023-07-13 NOTE — Patient Instructions (Signed)
Visit Information  Thank you for taking time to visit with me today. Please don't hesitate to contact me if I can be of assistance to you.   Following are the goals we discussed today:   Goals Addressed             This Visit's Progress    LCSW Obtain Supportive Resources   On track    Activities and task to complete in order to accomplish goals.   Keep all upcoming appointments discussed today Continue with compliance of taking medication prescribed by Doctor Implement healthy coping skills discussed to assist with management of symptoms F/up with Gastrologist, per ED recommendations. Continue tracking symptoms to assist in tx planning          Our next appointment is by telephone on 10/07 at 10 AM  Please call the care guide team at [phone] if you need to cancel or reschedule your appointment.   If you are experiencing a Mental Health or Behavioral Health Crisis or need someone to talk to, please call the Suicide and Crisis Lifeline: 988 call 911   Patient verbalizes understanding of instructions and care plan provided today and agrees to view in MyChart. Active MyChart status and patient understanding of how to access instructions and care plan via MyChart confirmed with patient.     [name], MSW, LCSW [facility] Care Management [place]  [facility] [email or web address] Phone [phone] 10:30 AM

## 2023-07-22 ENCOUNTER — Encounter: Payer: Self-pay | Admitting: Internal Medicine

## 2023-07-22 ENCOUNTER — Ambulatory Visit: Payer: 59 | Admitting: Internal Medicine

## 2023-07-22 VITALS — BP 124/68 | HR 86 | Temp 98.1°F | Resp 16 | Ht 63.0 in | Wt 258.0 lb

## 2023-07-22 DIAGNOSIS — E1122 Type 2 diabetes mellitus with diabetic chronic kidney disease: Secondary | ICD-10-CM | POA: Diagnosis not present

## 2023-07-22 DIAGNOSIS — N1832 Chronic kidney disease, stage 3b: Secondary | ICD-10-CM | POA: Diagnosis not present

## 2023-07-22 DIAGNOSIS — N1831 Chronic kidney disease, stage 3a: Secondary | ICD-10-CM

## 2023-07-22 DIAGNOSIS — I4819 Other persistent atrial fibrillation: Secondary | ICD-10-CM | POA: Diagnosis not present

## 2023-07-22 DIAGNOSIS — E538 Deficiency of other specified B group vitamins: Secondary | ICD-10-CM | POA: Diagnosis not present

## 2023-07-22 LAB — BASIC METABOLIC PANEL
BUN: 29 mg/dL — ABNORMAL HIGH (ref 6–23)
CO2: 28 meq/L (ref 19–32)
Calcium: 9.6 mg/dL (ref 8.4–10.5)
Chloride: 106 meq/L (ref 96–112)
Creatinine, Ser: 1.29 mg/dL — ABNORMAL HIGH (ref 0.40–1.20)
GFR: 41.88 mL/min — ABNORMAL LOW (ref >60.00)
Glucose, Bld: 116 mg/dL — ABNORMAL HIGH (ref 70–99)
Potassium: 4.2 meq/L (ref 3.5–5.1)
Sodium: 140 meq/L (ref 135–145)

## 2023-07-22 LAB — HEMOGLOBIN A1C: Hgb A1c MFr Bld: 5.7 % (ref 4.6–6.5)

## 2023-07-22 MED ORDER — CYANOCOBALAMIN 1000 MCG/ML IJ SOLN
1000.0000 ug | Freq: Once | INTRAMUSCULAR | Status: AC
Start: 2023-07-22 — End: 2023-07-22
  Administered 2023-07-22: 1000 ug via INTRAMUSCULAR

## 2023-07-22 MED ORDER — KERENDIA 20 MG PO TABS
1.0000 | ORAL_TABLET | Freq: Every day | ORAL | 0 refills | Status: DC
Start: 2023-07-22 — End: 2023-11-13

## 2023-07-22 NOTE — Patient Instructions (Signed)
 Chronic Kidney Disease, Adult Chronic kidney disease (CKD) occurs when the kidneys are slowly and permanently damaged over a long period of time. The kidneys are a pair of organs that do many important jobs in the body, including: Removing waste and extra fluid from the blood to make urine. Making hormones that maintain the amount of fluid in tissues and blood vessels. Maintaining the right amount of fluids and chemicals in the body. A small amount of kidney damage may not cause problems, but a large amount of damage may make it hard or impossible for the kidneys to work right. Steps must be taken to slow kidney damage or to stop it from getting worse. If steps are not taken, the kidneys may stop working permanently (end-stage renal disease, or ESRD). Most of the time, CKD does not go away, but it can often be controlled. People who have CKD are usually able to live full lives. What are the causes? The most common causes of this condition are diabetes and high blood pressure (hypertension). Other causes include: Cardiovascular diseases. These affect the heart and blood vessels. Kidney diseases. These include: Glomerulonephritis, or inflammation of the tiny filters in the kidneys. Interstitial nephritis. This is swelling of the small tubes of the kidneys and of the surrounding structures. Polycystic kidney disease, in which clusters of fluid-filled sacs form within the kidneys. Renal vascular disease. This includes disorders that affect the arteries and veins of the kidneys. Diseases that affect the body's defense system (immune system). A problem with urine flow. This may be caused by: Kidney stones. Cancer. An enlarged prostate, in males. A kidney infection or urinary tract infection (UTI) that keeps coming back. Vasculitis. This is swelling or inflammation of the blood vessels. What increases the risk? Your chances of having kidney disease increase with age. The following factors may make  you more likely to develop this condition: A family history of kidney disease or kidney failure. Kidney failure means the kidneys can no longer work right. Certain genetic diseases. Taking medicines often that are damaging to the kidneys. Being around or being in contact with toxic substances. Obesity. A history of tobacco use. What are the signs or symptoms? Symptoms of this condition include: Feeling very tired (lethargic) and having less energy. Swelling, or edema, of the face, legs, ankles, or feet. Nausea or vomiting, or loss of appetite. Confusion or trouble concentrating. Muscle twitches and cramps, especially in the legs. Dry, itchy skin. A metallic taste in the mouth. Producing less urine, or producing more urine (especially at night). Shortness of breath. Trouble sleeping. CKD may also result in not having enough red blood cells or hemoglobin in the blood (anemia) or having weak bones (bone disease). Symptoms develop slowly and may not be obvious until the kidney damage becomes severe. It is possible to have kidney disease for years without having symptoms. How is this diagnosed? This condition may be diagnosed based on: Blood tests. Urine tests. Imaging tests, such as an ultrasound or a CT scan. A kidney biopsy. This involves removing a sample of kidney tissue to be looked at under a microscope. Results from these tests will help to determine how serious the CKD is. How is this treated? There is no cure for most cases of this condition, but treatment usually relieves symptoms and prevents or slows the worsening of the disease. Treatment may include: Diet changes, which may require you to avoid alcohol and foods that are high in salt, potassium, phosphorous, and protein. Medicines. These may:  Lower blood pressure. Control blood sugar (glucose). Relieve anemia. Relieve swelling. Protect your bones. Improve the balance of salts and minerals in your blood  (electrolytes). Dialysis, which is a type of treatment that removes toxic waste from the body. It may be needed if you have kidney failure. Managing any other conditions that are causing your CKD or making it worse. Follow these instructions at home: Medicines Take over-the-counter and prescription medicines only as told by your health care provider. The amount of some medicines that you take may need to be changed. Do not take any new medicines unless approved by your health care provider. Many medicines can make kidney damage worse. Do not take any vitamin and mineral supplements unless approved by your health care provider. Many nutritional supplements can make kidney damage worse. Lifestyle  Do not use any products that contain nicotine or tobacco, such as cigarettes, e-cigarettes, and chewing tobacco. If you need help quitting, ask your health care provider. If you drink alcohol: Limit how much you use to: 0-1 drink a day for women who are not pregnant. 0-2 drinks a day for men. Know how much alcohol is in your drink. In the [place] one drink equals one 12 oz bottle of beer (355 mL), one 5 oz glass of wine (148 mL), or one 1 oz glass of hard liquor (44 mL). Maintain a healthy weight. If you need help, ask your health care provider. General instructions  Follow instructions from your health care provider about eating or drinking restrictions, including any prescribed diet. Track your blood pressure at home. Report changes in your blood pressure as told. If you are being treated for diabetes, track your blood glucose levels as told. Start or continue an exercise plan. Exercise at least 30 minutes a day, 5 days a week. Keep your immunizations up to date as told. Keep all follow-up visits. This is important. Where to find more information American Association of Kidney Patients: [employer].is [employer]: www.kidney.org American Kidney Fund: FightingMatch.com.ee Life Options:  www.lifeoptions.org Kidney School: www.kidneyschool.org Contact a health care provider if: Your symptoms get worse. You develop new symptoms. Get help right away if: You develop symptoms of ESRD. These include: Headaches. Numbness in your hands or feet. Easy bruising. Frequent hiccups. Chest pain. Shortness of breath. Lack of menstrual periods, in women. You have a fever. You are producing less urine than usual. You have pain or bleeding when you urinate or when you have a bowel movement. These symptoms may represent a serious problem that is an emergency. Do not wait to see if the symptoms will go away. Get medical help right away. Call your local emergency services (911 in the [place] Do not drive yourself to the hospital. Summary Chronic kidney disease (CKD) occurs when the kidneys become damaged slowly over a long period of time. The most common causes of this condition are diabetes and high blood pressure (hypertension). There is no cure for most cases of CKD, but treatment usually relieves symptoms and prevents or slows the worsening of the disease. Treatment may include a combination of lifestyle changes, medicines, and dialysis. This information is not intended to replace advice given to you by your health care provider. Make sure you discuss any questions you have with your health care provider. Document Revised: 01/05/2020 Document Reviewed: 01/10/2020 Elsevier Patient Education  2024 [employer].

## 2023-07-22 NOTE — Progress Notes (Signed)
Subjective:  Patient ID: Stephanie [name], female    DOB: 1951/01/11  Age: 72 y.o. MRN: [id number]  CC: Anemia, Atrial Fibrillation, Diabetes, and Congestive Heart Failure   HPI Jenavee Goulas presents for f/up ----  Discussed the use of AI scribe software for clinical note transcription with the patient, who gave verbal consent to proceed.  History of Present Illness   The patient, with a history of diverticulitis, atrial fibrillation, and stage three kidney disease, presented following recent visits to urgent care and the emergency room due to a flare-up of diverticulitis. They reported severe abdominal pain during the episode, which was managed with Advil, despite the known risk to their kidney function. The patient was offered pain medication via IV, but declined due to previous experiences of vomiting. The diverticulitis has since resolved, with no ongoing nausea, vomiting, or abdominal pain. The patient completed a course of antibiotics and is not currently taking any medication for nausea, vomiting, or pain.  The patient was unaware of their stage three kidney disease diagnosis until this consultation. They expressed surprise and concern, particularly about the potential need for dialysis. They reported regular use of Advil, which they were advised to discontinue due to its detrimental effect on kidney function.  The patient also has a history of atrial fibrillation and is on blood thinners.       Outpatient Medications Prior to Visit  Medication Sig Dispense Refill   Accu-Chek FastClix Lancets MISC      allopurinol (ZYLOPRIM) 100 MG tablet TAKE 1/2 TABLET(50 MG) BY MOUTH TWICE DAILY (Patient taking differently: Take 50 mg by mouth 2 (two) times daily as needed (gout flares.).) 90 tablet 3   ALPRAZolam (XANAX) 0.5 MG tablet Take 1 tablet (0.5 mg total) by mouth 3 (three) times daily as needed for anxiety. (Patient taking differently: Take 0.5 mg by mouth at bedtime as needed for  anxiety or sleep.) 270 tablet 0   apixaban (ELIQUIS) 5 MG TABS tablet Take 1 tablet (5 mg total) by mouth 2 (two) times daily. 60 tablet 5   Blood Pressure KIT 1 kit by Does not apply route daily. Check blood pressure once a day 1 kit 0   colchicine 0.6 MG tablet Take 0.6 mg by mouth daily as needed (gout flares).     furosemide (LASIX) 40 MG tablet Take 1 tablet (40 mg total) by mouth daily. 90 tablet 1   JARDIANCE 25 MG TABS tablet TAKE 1 TABLET(25 MG) BY MOUTH DAILY (Patient taking differently: Take 25 mg by mouth daily.) 90 tablet 1   metoprolol succinate (TOPROL-XL) 25 MG 24 hr tablet TAKE 1 TABLET(25 MG) BY MOUTH DAILY (Patient taking differently: Take 25 mg by mouth daily in the afternoon.) 90 tablet 3   potassium chloride SA (KLOR-CON M15) 15 MEQ tablet Take 1 tablet (15 mEq total) by mouth 3 (three) times daily. (Patient taking differently: Take 15 mEq by mouth in the morning and at bedtime.) 270 tablet 0   sacubitril-valsartan (ENTRESTO) 24-26 MG TAKE 1 TABLET BY MOUTH TWICE DAILY 180 tablet 3   simvastatin (ZOCOR) 10 MG tablet Take 1 tablet (10 mg total) by mouth daily. (Patient taking differently: Take 10 mg by mouth at bedtime.) 90 tablet 1   terbinafine (LAMISIL) 1 % cream Apply 1 Application topically 2 (two) times daily as needed (skin irritation/itching.).     tirzepatide (MOUNJARO) 5 MG/0.5ML Pen Inject 5 mg into the skin once a week. (Patient taking differently: Inject 5 mg into the skin  every Thursday.) 6 mL 0   amoxicillin-clavulanate (AUGMENTIN) 875-125 MG tablet Take 1 tablet by mouth every 12 (twelve) hours. 20 tablet 0   Finerenone (KERENDIA) 10 MG TABS Take 1 tablet (10 mg total) by mouth daily. 90 tablet 0   ibuprofen (ADVIL) 200 MG tablet Take 400 mg by mouth 2 (two) times daily.     ondansetron (ZOFRAN-ODT) 4 MG disintegrating tablet 4mg  ODT q4 hours prn nausea/vomit 12 tablet 0   oxyCODONE-acetaminophen (PERCOCET) 5-325 MG tablet Take 2 tablets by mouth every 6 (six)  hours as needed. 20 tablet 0   No facility-administered medications prior to visit.    ROS Review of Systems  Constitutional:  Negative for diaphoresis, fatigue and fever.  HENT: Negative.    Eyes: Negative.   Respiratory: Negative.  Negative for cough, chest tightness, shortness of breath and wheezing.   Cardiovascular:  Negative for chest pain, palpitations and leg swelling.  Gastrointestinal:  Negative for abdominal pain, constipation, diarrhea, nausea and vomiting.  Genitourinary: Negative.  Negative for difficulty urinating, dysuria and hematuria.  Musculoskeletal:  Positive for arthralgias. Negative for back pain and myalgias.  Skin:  Negative for color change and pallor.  Neurological:  Negative for dizziness, weakness and light-headedness.  Hematological:  Negative for adenopathy. Does not bruise/bleed easily.  Psychiatric/Behavioral:  Positive for confusion and decreased concentration. Negative for sleep disturbance and suicidal ideas. The patient is nervous/anxious.     Objective:  BP 124/68 (BP Location: Right Arm, Patient Position: Sitting, Cuff Size: Large)   Pulse 86   Temp 98.1 F (36.7 C) (Oral)   Resp 16   Ht 5\' 3"  (1.6 m)   Wt 258 lb (117 kg)   SpO2 95%   BMI 45.70 kg/m   BP Readings from Last 3 Encounters:  07/22/23 124/68  07/01/23 126/74  06/27/23 129/66    Wt Readings from Last 3 Encounters:  07/22/23 258 lb (117 kg)  07/01/23 259 lb 12.8 oz (117.8 kg)  06/27/23 275 lb (124.7 kg)    Physical Exam Vitals reviewed.  Constitutional:      Appearance: She is not ill-appearing.  HENT:     Mouth/Throat:     Mouth: Mucous membranes are moist.  Eyes:     General: No scleral icterus.    Conjunctiva/sclera: Conjunctivae normal.  Cardiovascular:     Rate and Rhythm: Normal rate. Rhythm irregularly irregular.     Pulses: Normal pulses.     Heart sounds: No murmur heard.    No friction rub. No gallop.  Pulmonary:     Effort: Pulmonary effort is  normal.     Breath sounds: No stridor. No wheezing, rhonchi or rales.  Abdominal:     General: Abdomen is flat.     Palpations: There is no mass.     Tenderness: There is no abdominal tenderness. There is no guarding.     Hernia: No hernia is present.  Musculoskeletal:        General: Normal range of motion.     Cervical back: Neck supple.     Right lower leg: No edema.     Left lower leg: No edema.  Lymphadenopathy:     Cervical: No cervical adenopathy.  Skin:    General: Skin is warm and dry.     Findings: No rash.  Neurological:     General: No focal deficit present.     Mental Status: She is alert. Mental status is at baseline.  Psychiatric:  Mood and Affect: Mood normal.        Behavior: Behavior normal.     Lab Results  Component Value Date   WBC 5.9 06/27/2023   HGB 11.0 (L) 06/27/2023   HCT 35.1 (L) 06/27/2023   PLT 192 06/27/2023   GLUCOSE 116 (H) 07/22/2023   CHOL 208 (H) 04/19/2023   TRIG 117.0 04/19/2023   HDL 59.70 04/19/2023   LDLCALC 125 (H) 04/19/2023   ALT 9 06/27/2023   AST 11 (L) 06/27/2023   NA 140 07/22/2023   K 4.2 07/22/2023   CL 106 07/22/2023   CREATININE 1.29 (H) 07/22/2023   BUN 29 (H) 07/22/2023   CO2 28 07/22/2023   TSH 3.28 04/19/2023   INR 1.2 (H) 04/05/2013   HGBA1C 5.7 07/22/2023   MICROALBUR <0.7 05/24/2023    CT ABDOMEN PELVIS W CONTRAST  Result Date: 06/27/2023 CLINICAL DATA:  Acute abdominal pain EXAM: CT ABDOMEN AND PELVIS WITH CONTRAST TECHNIQUE: Multidetector CT imaging of the abdomen and pelvis was performed using the standard protocol following bolus administration of intravenous contrast. RADIATION DOSE REDUCTION: This exam was performed according to the departmental dose-optimization program which includes automated exposure control, adjustment of the mA and/or kV according to patient size and/or use of iterative reconstruction technique. CONTRAST:  OMNIPAQUE IOHEXOL 300 MG/ML  SOLN COMPARISON:  CT abdomen and  pelvis 05/04/2023 FINDINGS: Lower chest: No acute abnormality. Hepatobiliary: No focal liver abnormality is seen. Status post cholecystectomy. No biliary dilatation. Pancreas: Unremarkable. No pancreatic ductal dilatation or surrounding inflammatory changes. Spleen: Normal in size without focal abnormality. Adrenals/Urinary Tract: There some scarring in the superior pole the left kidney. Otherwise, the kidneys, adrenal glands, and bladder are within normal limits. Stomach/Bowel: There is sigmoid colon diverticulosis. There is wall thickening of the mid sigmoid colon with surrounding inflammation worrisome for acute diverticulitis. There is no perforation or abscess identified. There is a questionable tract seen on the sagittal view between this inflamed edge of the colon in the vaginal cuff. There is no air in this tract. There is no air in the vaginal cuff. There is no bowel obstruction. The appendix and stomach are within normal limits. Vascular/Lymphatic: No significant vascular findings are present. No enlarged abdominal or pelvic lymph nodes. Reproductive: Status post hysterectomy. No adnexal masses. Other: No abdominal wall hernia or abnormality. No abdominopelvic ascites. Anterior abdominal wall mesh is noted at the level of the umbilicus, unchanged. Musculoskeletal: No acute or significant osseous findings. IMPRESSION: 1. Findings compatible with acute sigmoid colon diverticulitis. No perforation or abscess. 2. Questionable tract between the inflamed sigmoid colon and vaginal cuff. No air in this tract. No air in the vaginal cuff. Please correlate clinically. 3. Status post cholecystectomy and hysterectomy. Electronically Signed   By: Darliss Cheney M.D.   On: 06/27/2023 22:54    Assessment & Plan:   B12 deficiency -     Cyanocobalamin  Persistent atrial fibrillation (HCC)- She has good rate control.  Chronic kidney disease (CKD) stage G3a/A3, moderately decreased glomerular filtration rate (GFR)  between 45-59 mL/min/1.73 square meter and albuminuria creatinine ratio greater than 300 mg/g (HCC)- She agrees to avoid NSAIDs.  Will increase the dose of kerendia. -     Basic metabolic panel; Future  Type 2 diabetes mellitus with stage 3b chronic kidney disease, without long-term current use of insulin (HCC) - Her blood sugar is well controlled. -     Basic metabolic panel; Future -     Hemoglobin A1c; Future -  Chauncey Mann; Take 1 tablet (20 mg total) by mouth daily.  Dispense: 90 tablet; Refill: 0     Follow-up: Return in about 4 months (around 11/22/2023).  Sanda Linger, MD

## 2023-07-23 DIAGNOSIS — Z1231 Encounter for screening mammogram for malignant neoplasm of breast: Secondary | ICD-10-CM | POA: Diagnosis not present

## 2023-07-23 LAB — HM MAMMOGRAPHY

## 2023-07-26 ENCOUNTER — Ambulatory Visit: Payer: Self-pay | Admitting: Licensed Clinical Social Worker

## 2023-07-26 NOTE — Patient Outreach (Signed)
Care Coordination   Follow Up Visit Note   07/26/2023 Name: Stephanie [name] MRN: [id number] DOB: [birth year]  Stephanie [name] is a 72 y.o. year old female who sees Stephanie Grandchild, MD for primary care. I spoke with  Stephanie [name] by phone today.  What matters to the patients [name] and wellness today?  Symptom Management    Goals Addressed             This Visit's Progress    LCSW Obtain Supportive Resources   On track    Activities and task to complete in order to accomplish goals.   Keep all upcoming appointments discussed today Continue with compliance of taking medication prescribed by Doctor Implement healthy coping skills discussed to assist with management of symptoms F/up with Gastrologist, per ED recommendations. Continue tracking symptoms to assist in tx planning F/up with Pcp appt regarding pending labwork          SDOH assessments and interventions completed:  No     Care Coordination Interventions:  Yes, provided  Interventions Today    Flowsheet Row Most Recent Value  Chronic Disease   Chronic disease during today's visit Hypertension (HTN), Atrial Fibrillation (AFib), Chronic Kidney Disease/End Stage Renal Disease (ESRD), Other  [GAD and MDD]  General Interventions   General Interventions Discussed/Reviewed General Interventions Reviewed, Doctor Visits  Doctor Visits Discussed/Reviewed Doctor Visits Reviewed, Specialist  [Pt is awaiting lab results. She is not open to establishing with a kidney specialist and/or dialysis]  Mental [name] Interventions   Mental [name] Discussed/Reviewed Mental [name] Reviewed, Coping Strategies, Anxiety  [Processed feelings of frustration regarding patient care noting multiple negative experiences with staff and barriers obtaining meds. This has negatively impacted trust in healthcare. Strategies discussed to assist with symptom management]  Nutrition Interventions   Nutrition Discussed/Reviewed Nutrition Reviewed   Pharmacy Interventions   Pharmacy Dicussed/Reviewed Pharmacy Topics Reviewed, Medication Adherence       Follow up plan: Follow up call scheduled for 08/09/23    Encounter Outcome:  Patient Visit Completed   Stephanie [name], MSW, LCSW Stephanie [name] Care Management Stephanie [name]  [facility] [name] Phone [phone] 10:56 AM

## 2023-07-26 NOTE — Patient Instructions (Signed)
Visit Information  Thank you for taking time to visit with me today. Please don't hesitate to contact me if I can be of assistance to you.   Following are the goals we discussed today:   Goals Addressed             This Visit's Progress    LCSW Obtain Supportive Resources   On track    Activities and task to complete in order to accomplish goals.   Keep all upcoming appointments discussed today Continue with compliance of taking medication prescribed by Doctor Implement healthy coping skills discussed to assist with management of symptoms F/up with Gastrologist, per ED recommendations. Continue tracking symptoms to assist in tx planning F/up with Pcp appt regarding pending labwork          Our next appointment is by telephone on 08/09/23 at 10:30 AM  Please call the care guide team at [phone] if you need to cancel or reschedule your appointment.   If you are experiencing a Mental Health or Behavioral Health Crisis or need someone to talk to, please call the Suicide and Crisis Lifeline: 988 call 911   Patient verbalizes understanding of instructions and care plan provided today and agrees to view in MyChart. Active MyChart status and patient understanding of how to access instructions and care plan via MyChart confirmed with patient.     [name], MSW, LCSW [facility] Care Management [place]  [facility] [email or web address] Phone [phone] 10:56 AM

## 2023-07-27 ENCOUNTER — Encounter: Payer: Self-pay | Admitting: Internal Medicine

## 2023-08-05 NOTE — H&P (View-Only) (Signed)
Electrophysiology Office Note:   Date:  08/06/2023  ID:  Stephanie [name], DOB April 25, 1951, MRN [id number]  Primary Cardiologist: None Electrophysiologist: Sherryl Manges, MD      History of Present Illness:   Stephanie [name] is a 72 y.o. female with h/o PAF, Ocular sarcoidosis, IBS, HLD, Non-obstructive CAD by cath 2010, recurrent NICM felt 2/2 tachycardia seen today for routine electrophysiology followup.   Underwent [facility] 06/04/2023 but did not resume Multaq as planned due to misunderstanding.   She was back in AF office visit 07/01/2023.  Discussed Multaq vs amiodarone. Note says to resume multaq and potentially plan cardioversion.   Since last being seen in our clinic the patient reports doing about the same. She denies SOB. She has mild fatigue, but is also having a lot of issue with sleeping. Taking Xanax prn. It is not clear if she is specifically symptomatic from her AF. Mild palpitations. She denies chest pain,  dyspnea, PND, orthopnea, nausea, vomiting, dizziness, syncope, edema, weight gain, or early satiety.   Review of systems complete and found to be negative unless listed in HPI.   EP Information / Studies Reviewed:    EKG is ordered today. Personal review as below.  EKG Interpretation Date/Time:  Friday August 06 2023 08:08:06 EDT Ventricular Rate:  101 PR Interval:    QRS Duration:  94 QT Interval:  316 QTC Calculation: 409 R Axis:   1  Text Interpretation: Atrial fibrillation with rapid ventricular response with premature ventricular or aberrantly conducted complexes Nonspecific T wave abnormality Confirmed by Maxine Glenn [id number]) on 08/06/2023 8:10:08 AM    EKG 07/01/2023 showed AF at 91 bpm  Her AF/AAD history: --DCCV 2010, May 2014 with raid return of AF then sponatenous conversion to SR --March 2015 discussed with Dr. Johney Frame possible ablation but did not want to persue this, her amio was reduced to 200mg  daily and recommended lifestyle  adjustments --Amiodarone ultimately stopped with concerns of possible toxicity with neuro symptoms of staggering/tremor with improvement off as well as hyperthyroidism  -- CM felt to be AF/rate related 25% IN 2012, that is improved to 50-55% in 2014>> Dec 2017 again 25% in 2018 -- DCCV (x2 shocks) 03/24/2017 -- Mild abnormal sleep study no indication for CPAP 2014 DCCV 2021 Dec 2021  started on Tikosyn, load stopped 2/2 QT prolongation Dec 2021 Multaq started  Multaq stopped 04/2023 as had missed doses of OAC and was persistent in AF   Physical Exam:   VS:  BP 130/72   Pulse (!) 101   Ht 5\' 2"  (1.575 m)   Wt 258 lb (117 kg)   SpO2 99%   BMI 47.19 kg/m    Wt Readings from Last 3 Encounters:  08/06/23 258 lb (117 kg)  07/22/23 258 lb (117 kg)  07/01/23 259 lb 12.8 oz (117.8 kg)     GEN: Well nourished, well developed in no acute distress NECK: No JVD; No carotid bruits CARDIAC: Irregularly irregular rate and rhythm, no murmurs, rubs, gallops RESPIRATORY:  Clear to auscultation without rales, wheezing or rhonchi  ABDOMEN: Soft, non-tender, non-distended EXTREMITIES:  No edema; No deformity   ASSESSMENT AND PLAN:    Persistent atrial fibrillation EKG today shows AF  Continue Eliquis 5 mg BID for CHA2DS2VASc of at least 3 Not a candidate to retry Tikosyn with h/o QT prolongation She IS back on Multaq 400 mg BID. Will continue for now and plan cardioversion. She understands that Multaq is not to be used in longstanding persistent AF, and  should be stopped if cardioversion fails again.  Plan [facility] She had tremor and gait issues on amiodarone, but she wants to rechallenge if fails multaq.  Body habitus is prohibitive for ablation consideration  HTN Stable on current regimen   NICM 40-45% by 10/2020 Continue BB, Entresto, Diuretic, and SGLT2 Volume status stable on exa  Secondary hypercoagulable state Pt on Eliquis as above  Denies missed doses.   Follow up with Afib Clinic  in 1-2 weeks post cardioversion.  If DCC fails -> STOP multaq. Discuss cautious retrial of amiodarone at pts request.   If [facility] successful -> Continue multaq  Signed, Graciella Freer, PA-C

## 2023-08-05 NOTE — Progress Notes (Signed)
 Electrophysiology Office Note:   Date:  08/06/2023  ID:  Stephanie [name], DOB Jun 02, 1951, MRN [id number]  Primary Cardiologist: None Electrophysiologist: Elspeth Sage, MD      History of Present Illness:   Stephanie [name] is a 72 y.o. female with h/o PAF, Ocular sarcoidosis, IBS, HLD, Non-obstructive CAD by cath 2010, recurrent NICM felt 2/2 tachycardia seen today for routine electrophysiology followup.   Underwent [facility] 06/04/2023 but did not resume Multaq  as planned due to misunderstanding.   She was back in AF office visit 07/01/2023.  Discussed Multaq  vs amiodarone . Note says to resume multaq  and potentially plan cardioversion.   Since last being seen in our clinic the patient reports doing about the same. She denies SOB. She has mild fatigue, but is also having a lot of issue with sleeping. Taking Xanax  prn. It is not clear if she is specifically symptomatic from her AF. Mild palpitations. She denies chest pain,  dyspnea, PND, orthopnea, nausea, vomiting, dizziness, syncope, edema, weight gain, or early satiety.   Review of systems complete and found to be negative unless listed in HPI.   EP Information / Studies Reviewed:    EKG is ordered today. Personal review as below.  EKG Interpretation Date/Time:  Friday August 06 2023 08:08:06 EDT Ventricular Rate:  101 PR Interval:    QRS Duration:  94 QT Interval:  316 QTC Calculation: 409 R Axis:   1  Text Interpretation: Atrial fibrillation with rapid ventricular response with premature ventricular or aberrantly conducted complexes Nonspecific T wave abnormality Confirmed by Lesia Sharper [phone]) on 08/06/2023 8:10:08 AM    EKG 07/01/2023 showed AF at 91 bpm  Her AF/AAD history: --DCCV 2010, May 2014 with raid return of AF then sponatenous conversion to SR --March 2015 discussed with Dr. Kelsie possible ablation but did not want to persue this, her amio was reduced to 200mg  daily and recommended lifestyle  adjustments --Amiodarone  ultimately stopped with concerns of possible toxicity with neuro symptoms of staggering/tremor with improvement off as well as hyperthyroidism  -- CM felt to be AF/rate related 25% IN 2012, that is improved to 50-55% in 2014>> Dec 2017 again 25% in 2018 -- DCCV (x2 shocks) 03/24/2017 -- Mild abnormal sleep study no indication for CPAP 2014 DCCV 2021 Dec 2021  started on Tikosyn , load stopped 2/2 QT prolongation Dec 2021 Multaq  started  Multaq  stopped 04/2023 as had missed doses of OAC and was persistent in AF   Physical Exam:   VS:  BP 130/72   Pulse (!) 101   Ht 5\' 2"  (1.575 m)   Wt 258 lb (117 kg)   SpO2 99%   BMI 47.19 kg/m    Wt Readings from Last 3 Encounters:  08/06/23 258 lb (117 kg)  07/22/23 258 lb (117 kg)  07/01/23 259 lb 12.8 oz (117.8 kg)     GEN: Well nourished, well developed in no acute distress NECK: No JVD; No carotid bruits CARDIAC: Irregularly irregular rate and rhythm, no murmurs, rubs, gallops RESPIRATORY:  Clear to auscultation without rales, wheezing or rhonchi  ABDOMEN: Soft, non-tender, non-distended EXTREMITIES:  No edema; No deformity   ASSESSMENT AND PLAN:    Persistent atrial fibrillation EKG today shows AF  Continue Eliquis  5 mg BID for CHA2DS2VASc of at least 3 Not a candidate to retry Tikosyn  with h/o QT prolongation She IS back on Multaq  400 mg BID. Will continue for now and plan cardioversion. She understands that Multaq  is not to be used in longstanding persistent AF, and  should be stopped if cardioversion fails again.  Plan [facility] She had tremor and gait issues on amiodarone , but she wants to rechallenge if fails multaq .  Body habitus is prohibitive for ablation consideration  HTN Stable on current regimen   NICM 40-45% by 10/2020 Continue BB, Entresto , Diuretic, and SGLT2 Volume status stable on exa  Secondary hypercoagulable state Pt on Eliquis  as above  Denies missed doses.   Follow up with Afib Clinic  in 1-2 weeks post cardioversion.  If DCC fails -> STOP multaq . Discuss cautious retrial of amiodarone  at pts request.   If [facility] successful -> Continue multaq   Signed, Ozell Prentice Passey, PA-C

## 2023-08-06 ENCOUNTER — Encounter: Payer: Self-pay | Admitting: Student

## 2023-08-06 ENCOUNTER — Ambulatory Visit: Payer: 59 | Attending: Student | Admitting: Student

## 2023-08-06 ENCOUNTER — Encounter: Payer: Self-pay | Admitting: *Deleted

## 2023-08-06 VITALS — BP 130/72 | HR 101 | Ht 62.0 in | Wt 258.0 lb

## 2023-08-06 DIAGNOSIS — I428 Other cardiomyopathies: Secondary | ICD-10-CM

## 2023-08-06 DIAGNOSIS — I10 Essential (primary) hypertension: Secondary | ICD-10-CM | POA: Diagnosis not present

## 2023-08-06 DIAGNOSIS — I4819 Other persistent atrial fibrillation: Secondary | ICD-10-CM

## 2023-08-06 NOTE — Patient Instructions (Signed)
Medication Instructions:  Your physician recommends that you continue on your current medications as directed. Please refer to the Current Medication list given to you today.  *If you need a refill on your cardiac medications before your next appointment, please call your pharmacy*  Lab Work: BMET, CBC-TODAY If you have labs (blood work) drawn today and your tests are completely normal, you will receive your results only by: MyChart Message (if you have MyChart) OR A paper copy in the mail If you have any lab test that is abnormal or we need to change your treatment, we will call you to review the results.   Testing/Procedures: Your physician has recommended that you have a Cardioversion (DCCV). Electrical Cardioversion uses a jolt of electricity to your heart either through paddles or wired patches attached to your chest. This is a controlled, usually prescheduled, procedure. Defibrillation is done under light anesthesia in the hospital, and you usually go home the day of the procedure. This is done to get your heart back into a normal rhythm. You are not awake for the procedure. Please see the instruction sheet given to you today.    Follow-Up: At [facility], you and your health needs are our priority.  As part of our continuing mission to provide you with exceptional heart care, we have created designated Provider Care Teams.  These Care Teams include your primary Cardiologist (physician) and Advanced Practice Providers (APPs -  Physician Assistants and Nurse Practitioners) who all work together to provide you with the care you need, when you need it.  Your next appointment:   1-2 week(s) post cardioversion  Provider:   You will follow up in the Atrial Fibrillation Clinic located at [facility]. Your provider will be: Clint R. Fenton, PA-C or Lake Bells, PA-C

## 2023-08-07 LAB — CBC
Hematocrit: 38 % (ref 34.0–46.6)
Hemoglobin: 11.5 g/dL (ref 11.1–15.9)
MCH: 29.7 pg (ref 26.6–33.0)
MCHC: 30.3 g/dL — ABNORMAL LOW (ref 31.5–35.7)
MCV: 98 fL — ABNORMAL HIGH (ref 79–97)
Platelets: 229 10*3/uL (ref 150–450)
RBC: 3.87 x10E6/uL (ref 3.77–5.28)
RDW: 13.6 % (ref 11.7–15.4)
WBC: 6.1 10*3/uL (ref 3.4–10.8)

## 2023-08-07 LAB — BASIC METABOLIC PANEL
BUN/Creatinine Ratio: 18 (ref 12–28)
BUN: 25 mg/dL (ref 8–27)
CO2: 21 mmol/L (ref 20–29)
Calcium: 9.6 mg/dL (ref 8.7–10.3)
Chloride: 105 mmol/L (ref 96–106)
Creatinine, Ser: 1.37 mg/dL — ABNORMAL HIGH (ref 0.57–1.00)
Glucose: 147 mg/dL — ABNORMAL HIGH (ref 70–99)
Potassium: 4.6 mmol/L (ref 3.5–5.2)
Sodium: 141 mmol/L (ref 134–144)
eGFR: 41 mL/min/{1.73_m2} — ABNORMAL LOW (ref >59)

## 2023-08-08 NOTE — Pre-Procedure Instructions (Signed)
Patient medication profile states she takes Mounjaro.  Patient states she is not taking, hasn't taken last 3 weeks.  Her MD didn't prescribe it again.

## 2023-08-09 ENCOUNTER — Ambulatory Visit: Payer: Self-pay | Admitting: Licensed Clinical Social Worker

## 2023-08-10 ENCOUNTER — Telehealth: Payer: Self-pay | Admitting: Gastroenterology

## 2023-08-10 NOTE — Patient Instructions (Signed)
Visit Information  Thank you for taking time to visit with me today. Please don't hesitate to contact me if I can be of assistance to you.   Following are the goals we discussed today:   Goals Addressed             This Visit's Progress    LCSW Obtain Supportive Resources   On track    Activities and task to complete in order to accomplish goals.   Keep all upcoming appointments discussed today Continue with compliance of taking medication prescribed by Doctor Implement healthy coping skills discussed to assist with management of symptoms F/up with Gastrologist, per ED recommendations. Continue tracking symptoms to assist in tx planning F/up with Pcp appt regarding pending labwork          Our next appointment is by telephone on 11/4 at 3:30 pm  Please call the care guide team at [phone] if you need to cancel or reschedule your appointment.   If you are experiencing a Mental Health or Behavioral Health Crisis or need someone to talk to, please call the Suicide and Crisis Lifeline: 988 call 911   Patient verbalizes understanding of instructions and care plan provided today and agrees to view in MyChart. Active MyChart status and patient understanding of how to access instructions and care plan via MyChart confirmed with patient.     [name], MSW, LCSW [facility] Care Management [place]  [facility] [email or web address] Phone [phone] 8:25 AM

## 2023-08-10 NOTE — Telephone Encounter (Signed)
Patient called states she has been having issues with Diverticulitis and was also seen at the ED. Please advise further offered next available but she said she needs to be seen sooner.

## 2023-08-10 NOTE — Patient Outreach (Signed)
Care Coordination   Follow Up Visit Note   08/09/2023 Name: Stephanie [name] MRN: [id number] DOB: [birth year]  Stephanie [name] is a 72 y.o. year old female who sees [name], MD for primary care. I spoke with  [name] by phone today.  What matters to the patients health and wellness today?  Symptom Managment    Goals Addressed             This Visit's Progress    LCSW Obtain Supportive Resources   On track    Activities and task to complete in order to accomplish goals.   Keep all upcoming appointments discussed today Continue with compliance of taking medication prescribed by Doctor Implement healthy coping skills discussed to assist with management of symptoms F/up with Gastrologist, per ED recommendations. Continue tracking symptoms to assist in tx planning F/up with Pcp appt regarding pending labwork          SDOH assessments and interventions completed:  No     Care Coordination Interventions:  Yes, provided  Interventions Today    Flowsheet Row Most Recent Value  Chronic Disease   Chronic disease during today's visit Hypertension (HTN), Atrial Fibrillation (AFib), Chronic Kidney Disease/End Stage Renal Disease (ESRD), Other  [GAD and MDD]  General Interventions   General Interventions Discussed/Reviewed General Interventions Reviewed, Doctor Visits  [LCSW provided pt with contact info for Jarvis Newcomer, pt agreed to call to schedule f/up appt]  Doctor Visits Discussed/Reviewed Doctor Visits Reviewed  Mental Health Interventions   Mental Health Discussed/Reviewed Mental Health Reviewed, Coping Strategies, Anxiety, Depression  Nutrition Interventions   Nutrition Discussed/Reviewed Nutrition Reviewed  Pharmacy Interventions   Pharmacy Dicussed/Reviewed Pharmacy Topics Reviewed, Medication Adherence       Follow up plan: Follow up call scheduled for 2-4 weeks    Encounter Outcome:  Patient Visit Completed   [name], MSW, LCSW [facility] Care  Management [facility]  [facility] [email or web address] Phone [phone] 8:24 AM

## 2023-08-11 NOTE — Telephone Encounter (Signed)
Patient calls with c/o several episodes of recurrent diverticulitis over the last several months. Last CT confirmed episode was 06/2023. Patient states that she is currently feeling better since antibiotics (no abdominal pain, change in bowels etc) but was told that she would need to follow up with gastroenterology for possible further evaluation. Patient to see Hyacinth Meeker, PA-C on 08/19/23 at 1:30 pm. Patient verbalizes understanding.

## 2023-08-13 DIAGNOSIS — H2513 Age-related nuclear cataract, bilateral: Secondary | ICD-10-CM | POA: Diagnosis not present

## 2023-08-13 NOTE — OR Nursing (Addendum)
Called patient with pre-procedure instructions for tomorrow.   Patient informed of:   Time to arrive for procedure. 0700 Remain NPO past midnight.  Must have a ride home and a responsible adult to remain with them for 24 hours post procedure.  Confirmed blood thinner. Confirmed no breaks in taking blood thinner for 3+ weeks prior to procedure. Confirmed patient stopped all GLP-1s and GLP-2s for at least one week before procedure.   Left a message for patient regarding above information. Instructed to call back with any questions.

## 2023-08-15 NOTE — Anesthesia Preprocedure Evaluation (Addendum)
Anesthesia Evaluation  Patient identified by MRN, date of birth, ID band Patient awake    Reviewed: Allergy & Precautions, NPO status , Patient's Chart, lab work & pertinent test results, reviewed documented beta blocker date and time   History of Anesthesia Complications (+) PONV and history of anesthetic complications  Airway Mallampati: II  TM Distance: >3 FB     Dental  (+) Teeth Intact, Dental Advisory Given   Pulmonary asthma  Hx/o sarcoidosis   Pulmonary exam normal breath sounds clear to auscultation       Cardiovascular hypertension, Pt. on medications and Pt. on home beta blockers +CHF  + dysrhythmias Atrial Fibrillation  Rhythm:Irregular Rate:Normal  EKG 08/06/23 Atrial fibrillation with RVR  Echo 11/14/20 1. Left ventricular ejection fraction, by estimation, is 40 to 45%. The  left ventricle has mildly decreased function. Left ventricular endocardial  border not optimally defined to evaluate regional wall motion. There is  moderate left ventricular  hypertrophy. Left ventricular diastolic parameters are indeterminate.   2. Right ventricular systolic function was not well visualized. The right  ventricular size is normal. Tricuspid regurgitation signal is inadequate  for assessing PA pressure.   3. Left atrial size was mild to moderately dilated.   4. The mitral valve is normal in structure. No evidence of mitral valve  regurgitation. No evidence of mitral stenosis.   5. The aortic valve was not well visualized. Aortic valve regurgitation  is not visualized. Mild aortic valve sclerosis is present, with no  evidence of aortic valve stenosis.   6. The inferior vena cava is normal in size with greater than 50%  respiratory variability, suggesting right atrial pressure of 3 mmHg.   7. Technically difficult study with poor acoustic windows.     Neuro/Psych  PSYCHIATRIC DISORDERS Anxiety Depression    negative  neurological ROS     GI/Hepatic ,,,(+) Hepatitis -IBS Hx/o diverticulosis   Endo/Other  diabetes, Well Controlled, Type 2, Oral Hypoglycemic AgentsHypothyroidism  Morbid obesityGLP-1 RA therapy Hyperlipidemia Gout  Renal/GU Renal InsufficiencyRenal disease  negative genitourinary   Musculoskeletal  (+) Arthritis , Osteoarthritis,    Abdominal  (+) + obese  Peds  Hematology  (+) Blood dyscrasia, anemia Eliquis therapy   Anesthesia Other Findings   Reproductive/Obstetrics                              Anesthesia Physical Anesthesia Plan  ASA: 3  Anesthesia Plan: General   Post-op Pain Management: Minimal or no pain anticipated   Induction: Intravenous  PONV Risk Score and Plan: 4 or greater and Treatment may vary due to age or medical condition, TIVA and Propofol infusion  Airway Management Planned: Natural Airway and Mask  Additional Equipment: None  Intra-op Plan:   Post-operative Plan:   Informed Consent: I have reviewed the patients History and Physical, chart, labs and discussed the procedure including the risks, benefits and alternatives for the proposed anesthesia with the patient or authorized representative who has indicated his/her understanding and acceptance.     Dental advisory given  Plan Discussed with: CRNA and Anesthesiologist  Anesthesia Plan Comments:         Anesthesia Quick Evaluation

## 2023-08-16 ENCOUNTER — Other Ambulatory Visit: Payer: Self-pay

## 2023-08-16 ENCOUNTER — Ambulatory Visit (HOSPITAL_COMMUNITY)
Admission: RE | Admit: 2023-08-16 | Discharge: 2023-08-16 | Disposition: A | Discharge status: Home / Self Care | Payer: 59 | Attending: Internal Medicine | Admitting: Internal Medicine

## 2023-08-16 ENCOUNTER — Encounter (HOSPITAL_COMMUNITY)
Admission: RE | Disposition: A | Discharge status: Home / Self Care | Payer: Self-pay | Source: Home / Self Care | Attending: Internal Medicine

## 2023-08-16 ENCOUNTER — Ambulatory Visit (HOSPITAL_COMMUNITY): Payer: 59 | Admitting: Anesthesiology

## 2023-08-16 DIAGNOSIS — I10 Essential (primary) hypertension: Secondary | ICD-10-CM | POA: Insufficient documentation

## 2023-08-16 DIAGNOSIS — D6869 Other thrombophilia: Secondary | ICD-10-CM | POA: Diagnosis not present

## 2023-08-16 DIAGNOSIS — I4819 Other persistent atrial fibrillation: Secondary | ICD-10-CM | POA: Insufficient documentation

## 2023-08-16 DIAGNOSIS — Z7901 Long term (current) use of anticoagulants: Secondary | ICD-10-CM | POA: Diagnosis not present

## 2023-08-16 DIAGNOSIS — Z7984 Long term (current) use of oral hypoglycemic drugs: Secondary | ICD-10-CM | POA: Diagnosis not present

## 2023-08-16 DIAGNOSIS — I4891 Unspecified atrial fibrillation: Secondary | ICD-10-CM

## 2023-08-16 DIAGNOSIS — Z79899 Other long term (current) drug therapy: Secondary | ICD-10-CM | POA: Insufficient documentation

## 2023-08-16 DIAGNOSIS — E1122 Type 2 diabetes mellitus with diabetic chronic kidney disease: Secondary | ICD-10-CM | POA: Diagnosis not present

## 2023-08-16 DIAGNOSIS — I2510 Atherosclerotic heart disease of native coronary artery without angina pectoris: Secondary | ICD-10-CM | POA: Diagnosis not present

## 2023-08-16 DIAGNOSIS — I428 Other cardiomyopathies: Secondary | ICD-10-CM | POA: Insufficient documentation

## 2023-08-16 DIAGNOSIS — N1831 Chronic kidney disease, stage 3a: Secondary | ICD-10-CM | POA: Diagnosis not present

## 2023-08-16 HISTORY — PX: CARDIOVERSION: SHX1299

## 2023-08-16 LAB — GLUCOSE, CAPILLARY: Glucose-Capillary: 181 mg/dL — ABNORMAL HIGH (ref 70–99)

## 2023-08-16 SURGERY — CARDIOVERSION
Anesthesia: General

## 2023-08-16 MED ORDER — LIDOCAINE 2% (20 MG/ML) 5 ML SYRINGE
INTRAMUSCULAR | Status: DC | PRN
  Administered 2023-08-16: 50 mg via INTRAVENOUS

## 2023-08-16 MED ORDER — PROPOFOL 10 MG/ML IV BOLUS
INTRAVENOUS | Status: DC | PRN
  Administered 2023-08-16: 50 mg via INTRAVENOUS

## 2023-08-16 SURGICAL SUPPLY — 1 items: PAD DEFIB RADIO PHYSIO CONN (PAD) ×1 IMPLANT

## 2023-08-16 NOTE — Discharge Instructions (Signed)

## 2023-08-16 NOTE — Transfer of Care (Signed)
Immediate Anesthesia Transfer of Care Note  Patient: Stephanie [name]  Procedure(s) Performed: CARDIOVERSION  Patient Location: PACU and Cath Lab  Anesthesia Type:General  Level of Consciousness: awake  Airway & Oxygen Therapy: Patient Spontanous Breathing  Post-op Assessment: Report given to RN  Post vital signs: stable  Last Vitals:  Vitals Value Taken Time  BP    Temp    Pulse    Resp    SpO2      Last Pain:  Vitals:   08/16/23 0739  TempSrc: Temporal         Complications: No notable events documented.

## 2023-08-16 NOTE — Anesthesia Postprocedure Evaluation (Signed)
Anesthesia Post Note  Patient: Stephanie [name]  Procedure(s) Performed: CARDIOVERSION     Patient location during evaluation: PACU Anesthesia Type: General Level of consciousness: awake and alert and oriented Pain management: pain level controlled Vital Signs Assessment: post-procedure vital signs reviewed and stable Respiratory status: spontaneous breathing, nonlabored ventilation and respiratory function stable Cardiovascular status: blood pressure returned to baseline and stable Postop Assessment: no apparent nausea or vomiting Anesthetic complications: no   No notable events documented.  Last Vitals:  Vitals:   08/16/23 0850 08/16/23 0855  BP: 106/70   Pulse: (!) 52 (!) 51  Resp: 15 14  Temp:    SpO2: 96% 97%    Last Pain:  Vitals:   08/16/23 0839  TempSrc: Temporal  PainSc: 0-No pain                 [name] A.

## 2023-08-16 NOTE — CV Procedure (Signed)
    CARDIOVERSION NOTE  Procedure: Electrical Cardioversion Indications:  Atrial Fibrillation  Procedure Details:  Consent: Risks of procedure as well as the alternatives and risks of each were explained to the (patient/caregiver).  Consent for procedure obtained.  Time Out: Verified patient identification, verified procedure, site/side was marked, verified correct patient position, special equipment/implants available, medications/allergies/relevent history reviewed, required imaging and test results available.  Performed  Patient placed on cardiac monitor, pulse oximetry, supplemental oxygen as necessary.  Sedation given:  propofol per anesthesia Pacer pads placed anterior and posterior chest.  Cardioverted 1 time(s).  Cardioverted at 200J biphasic.  Impression: Findings: Post procedure EKG shows: NSR Complications: None Patient did tolerate procedure well.  Plan: Successful DCCV with a single 200J biphasic shock to NSR.  Time Spent Directly with the Patient:  30 minutes   Chrystie Nose, MD, [facility], FACP  [place]  [facility]  Medical Director of the Advanced Lipid Disorders &  Cardiovascular Risk Reduction Clinic Diplomate of the American Board of Clinical Lipidology Attending Cardiologist  Direct Dial: [phone]  Fax: [phone]  Website:  [email or web address] 08/16/2023, 8:39 AM

## 2023-08-16 NOTE — Interval H&P Note (Signed)
History and Physical Interval Note:  08/16/2023 7:34 AM  Stephanie [name]  has presented today for surgery, with the diagnosis of afib.  The various methods of treatment have been discussed with the patient and family. After consideration of risks, benefits and other options for treatment, the patient has consented to  Procedure(s): CARDIOVERSION (N/A) as a surgical intervention.  The patient's history has been reviewed, patient examined, no change in status, stable for surgery.  I have reviewed the patient's chart and labs.  Questions were answered to the patient's satisfaction.     Chrystie Nose

## 2023-08-17 ENCOUNTER — Encounter (HOSPITAL_COMMUNITY): Payer: Self-pay | Admitting: Internal Medicine

## 2023-08-19 ENCOUNTER — Encounter: Payer: Self-pay | Admitting: Physician Assistant

## 2023-08-19 ENCOUNTER — Ambulatory Visit: Payer: 59 | Admitting: Physician Assistant

## 2023-08-19 VITALS — BP 124/72 | HR 72 | Ht 63.0 in | Wt 257.4 lb

## 2023-08-19 DIAGNOSIS — Z860101 Personal history of adenomatous and serrated colon polyps: Secondary | ICD-10-CM | POA: Diagnosis not present

## 2023-08-19 DIAGNOSIS — Z8719 Personal history of other diseases of the digestive system: Secondary | ICD-10-CM | POA: Diagnosis not present

## 2023-08-19 DIAGNOSIS — K5900 Constipation, unspecified: Secondary | ICD-10-CM

## 2023-08-19 MED ORDER — NA SULFATE-K SULFATE-MG SULF 17.5-3.13-1.6 GM/177ML PO SOLN: 1.0000 | ORAL | 0 refills | Status: DC

## 2023-08-19 NOTE — Patient Instructions (Signed)
Start Miralax - 1 capful daily in at least 8 ounces of water daily for 1 week prior to procedure.   You have been scheduled for a colonoscopy. Please follow written instructions given to you at your visit today.   Please pick up your prep supplies at the pharmacy within the next 1-3 days.  If you use inhalers (even only as needed), please bring them with you on the day of your procedure.  DO NOT TAKE 7 DAYS PRIOR TO TEST- Trulicity (dulaglutide) Ozempic, Wegovy (semaglutide) Mounjaro (tirzepatide) Bydureon Bcise (exanatide extended release)  DO NOT TAKE 1 DAY PRIOR TO YOUR TEST Rybelsus (semaglutide) Adlyxin (lixisenatide) Victoza (liraglutide) Byetta (exanatide) _________________________________________________________________________    If your blood pressure at your visit was 140/90 or greater, please contact your primary care physician to follow up on this.  _______________________________________________________  If you are age 51 or older, your body mass index should be between 23-30. Your Body mass index is 45.59 kg/m. If this is out of the aforementioned range listed, please consider follow up with your Primary Care Provider.  If you are age 73 or younger, your body mass index should be between 19-25. Your Body mass index is 45.59 kg/m. If this is out of the aformentioned range listed, please consider follow up with your Primary Care Provider.   ________________________________________________________  The [facility] providers would like to encourage you to use [facility] to communicate with providers for non-urgent requests or questions.  Due to long hold times on the telephone, sending your provider a message by [facility] may be a faster and more efficient way to get a response.  Please allow 48 business hours for a response.  Please remember that this is for non-urgent requests.  _______________________________________________________  Due to recent changes in healthcare  laws, you may see the results of your imaging and laboratory studies on MyChart before your provider has had a chance to review them.  We understand that in some cases there may be results that are confusing or concerning to you. Not all laboratory results come back in the same time frame and the provider may be waiting for multiple results in order to interpret others.  Please give [place] 48 hours in order for your provider to thoroughly review all the results before contacting the office for clarification of your results.   Thank you for choosing me and [facility].  Hyacinth Meeker PA-C

## 2023-08-19 NOTE — Progress Notes (Signed)
Chief Complaint: Follow-up after ER visit for diverticulitis  HPI:    Stephanie [name] is a 72 year old African-American female, known to Dr. Adela Lank, with a past medical history as listed below including diabetes, diastolic CHF, A-fib on Eliquis, diabetes on Mounjaro , and multiple others, who presents to clinic today for follow-up after recent ER visit for diverticulitis.    07/25/2019 colonoscopy with 1 diminutive polyp removed from the ascending colon, diverticulosis in the sigmoid colon otherwise normal.  Repeat colonoscopy recommended in 3 years.    05/03/2023 CT the abdomen pelvis with contrast showed mild acute to subacute sigmoid diverticulitis.  Background severe sigmoid diverticulosis.    06/27/2023 patient seen in the ER for left upper quadrant pain.  Hemoglobin 11.0, white count normal, AST 11 and urinalysis negative.  CTAP showed findings compatible with acute sigmoid colon diverticulitis.  Questionable try between the inflamed sigmoid colon and vaginal cuff.  Status post cholecystectomy and hysterectomy.  Patient was discharged with Augmentin and Percocet.    08/06/2023 CBC normal.  BMP with a creatinine elevated 1.37.    08/16/2023 patient underwent cardioversion for A-fib.    Today, patient presents to clinic and tells me that she has tried [facility] on 2 separate instances and it seems like around this time she ended up in the ER with diverticulitis.  She was treated with antibiotics both times and tells me this helped with the severe abdominal pain, but in between these instances she has continued to get somewhat constipated and feels like this also increases her pain.  Most recently has been running more constipated than not.  She is not currently on her Mounjaro.  She is still on Eliquis and just underwent cardioversion as above.  Denies any other acute GI complaints or concerns.    Denies fever, chills or weight loss.     Past Medical History:  Diagnosis Date   Anemia    Ankle  fracture, right    x 2   Antineoplastic and immunosuppressive drugs causing adverse effect in therapeutic use    Asthma    Chronic venous hypertension without complications    Colon polyps 2009   Coronary artery disease    no CAD by cath 11.2010   Depressive disorder, not elsewhere classified    Diabetes mellitus type 2 in obese    hgb AIC 6.1 09/02/2009, insulin dependent    Diastolic CHF, chronic (HCC)    reports stable   Diverticulosis of colon 2009   colonoscopy   Gout    cortisone  shots helped   Hepatitis, unspecified    hx of/per pt, never was told of having hepatitis   Hx of hysterectomy 2002   Hyperlipidemia    Hypertension    controlled on medication    Hypothyroidism    reports her thryoid levels are normal now    Irritable bowel syndrome    Lung nodule    12mm RLL nodule on CT Chest  07/2009, resolved by 2011 CT   Noncompliance    Obesity    Persistent atrial fibrillation (HCC)    s/p DCCV 07/2009; Pradaxa Rx, states she is in sinus rhythm now    PONV (postoperative nausea and vomiting)    after receiving "too much anesthesia" after a hernia surgery    Sarcoidosis    dx by eye exam; seen during eye exam , report asymtptomatic "i dont have it now'    Sleep disturbance 06/2013   sleep study, mild abnormality, no criteria for CPAP  Past Surgical History:  Procedure Laterality Date   ABDOMINAL HYSTERECTOMY     CARDIOVERSION     x 2   CARDIOVERSION N/A 02/27/2013   Procedure: CARDIOVERSION;  Surgeon: Lewayne Bunting, MD;  Location: [facility] ENDOSCOPY;  Service: Cardiovascular;  Laterality: N/A;   CARDIOVERSION N/A 03/24/2017   Procedure: CARDIOVERSION;  Surgeon: Wendall Stade, MD;  Location: [facility] ENDOSCOPY;  Service: Cardiovascular;  Laterality: N/A;   CARDIOVERSION N/A 03/04/2020   Procedure: CARDIOVERSION;  Surgeon: Wendall Stade, MD;  Location: [facility] ENDOSCOPY;  Service: Cardiovascular;  Laterality: N/A;   CARDIOVERSION N/A 06/04/2023   Procedure: CARDIOVERSION;   Surgeon: Sande Rives, MD;  Location: [facility] INVASIVE CV LAB;  Service: Cardiovascular;  Laterality: N/A;   CARDIOVERSION N/A 08/16/2023   Procedure: CARDIOVERSION;  Surgeon: Chrystie Nose, MD;  Location: [facility] INVASIVE CV LAB;  Service: Cardiovascular;  Laterality: N/A;   CHOLECYSTECTOMY  early 80s   COLONOSCOPY     Diverticulosis, colon polyps, repeat 2014, Dr. Arlyce Dice   COLONOSCOPY WITH PROPOFOL N/A 07/25/2019   Procedure: COLONOSCOPY WITH PROPOFOL;  Surgeon: Benancio Deeds, MD;  Location: [facility] ENDOSCOPY;  Service: Gastroenterology;  Laterality: N/A;   ESOPHAGOGASTRODUODENOSCOPY N/A 05/16/2014   Procedure: ESOPHAGOGASTRODUODENOSCOPY (EGD);  Surgeon: Graylin Shiver, MD;  Location: [facility] ORS;  Service: Gastroenterology;  Laterality: N/A;   HERNIA REPAIR  2009   umbilical hernia   MYOMECTOMY  2000   POLYPECTOMY  07/25/2019   Procedure: POLYPECTOMY;  Surgeon: Benancio Deeds, MD;  Location: [facility] ENDOSCOPY;  Service: Gastroenterology;;    Current Outpatient Medications  Medication Sig Dispense Refill   Accu-Chek FastClix Lancets MISC      allopurinol (ZYLOPRIM) 100 MG tablet TAKE 1/2 TABLET(50 MG) BY MOUTH TWICE DAILY (Patient not taking: Reported on 08/06/2023) 90 tablet 3   ALPRAZolam (XANAX) 0.5 MG tablet Take 1 tablet (0.5 mg total) by mouth 3 (three) times daily as needed for anxiety. (Patient taking differently: Take 0.5 mg by mouth at bedtime as needed for anxiety or sleep.) 270 tablet 0   apixaban (ELIQUIS) 5 MG TABS tablet Take 1 tablet (5 mg total) by mouth 2 (two) times daily. 60 tablet 5   Blood Pressure KIT 1 kit by Does not apply route daily. Check blood pressure once a day (Patient not taking: Reported on 08/06/2023) 1 kit 0   colchicine 0.6 MG tablet Take 0.6 mg by mouth daily as needed (gout flares).     Finerenone (KERENDIA) 20 MG TABS Take 1 tablet (20 mg total) by mouth daily. 90 tablet 0   furosemide (LASIX) 40 MG tablet Take 1 tablet (40 mg total) by mouth daily. 90  tablet 1   JARDIANCE 25 MG TABS tablet TAKE 1 TABLET(25 MG) BY MOUTH DAILY (Patient taking differently: Take 25 mg by mouth daily.) 90 tablet 1   metoprolol succinate (TOPROL-XL) 25 MG 24 hr tablet TAKE 1 TABLET(25 MG) BY MOUTH DAILY (Patient taking differently: Take 25 mg by mouth daily in the afternoon.) 90 tablet 3   MULTAQ 400 MG tablet Take 400 mg by mouth 2 (two) times daily.     potassium chloride SA (KLOR-CON M15) 15 MEQ tablet Take 1 tablet (15 mEq total) by mouth 3 (three) times daily. (Patient taking differently: Take 15 mEq by mouth in the morning and at bedtime.) 270 tablet 0   sacubitril-valsartan (ENTRESTO) 24-26 MG TAKE 1 TABLET BY MOUTH TWICE DAILY 180 tablet 3   simvastatin (ZOCOR) 10 MG tablet Take 1 tablet (10 mg  total) by mouth daily. (Patient taking differently: Take 10 mg by mouth at bedtime.) 90 tablet 1   terbinafine (LAMISIL) 1 % cream Apply 1 Application topically 2 (two) times daily as needed (skin irritation/itching.).     tirzepatide (MOUNJARO) 5 MG/0.5ML Pen Inject 5 mg into the skin once a week. 6 mL 0   No current facility-administered medications for this visit.    Allergies as of 08/19/2023 - Review Complete 08/16/2023  Allergen Reaction Noted   Amiodarone Other (See Comments) 11/11/2015   Aspirin Other (See Comments) 05/31/2020   Methimazole Nausea And Vomiting 05/31/2020   Propoxyphene Nausea And Vomiting 08/21/2009   Trintellix [vortioxetine] Nausea And Vomiting 06/10/2022   Fentanyl Nausea And Vomiting 05/27/2023   Albuterol Palpitations 02/06/2013   Lipitor [atorvastatin] Other (See Comments) 08/21/2009   Livalo [pitavastatin] Other (See Comments) 05/07/2015    Family History  Problem Relation Age of Onset   Pneumonia Father        deceased   Hypertension Father    Cancer Father    Multiple sclerosis Mother        hx   Emphysema Other        runs in the family    Social History   Socioeconomic History   Marital status: Single    Spouse  name: Not on file   Number of children: 5   Years of education: college   Highest education level: Not on file  Occupational History   Occupation: Disabled.  Tobacco Use   Smoking status: Never   Smokeless tobacco: Never   Tobacco comments:    Never smoke 01/27/22  Vaping Use   Vaping status: Never Used  Substance and Sexual Activity   Alcohol use: Yes    Alcohol/week: 0.0 standard drinks of alcohol    Comment: rare   Drug use: No   Sexual activity: Not Currently    Birth control/protection: Surgical  Other Topics Concern   Not on file  Social History Narrative   Pt lives in [place] with her son.  Unemployed.  Right handed.     Education college   Social Determinants of Health   Financial Resource Strain: Low Risk  (05/07/2022)   Overall Financial Resource Strain (CARDIA)    Difficulty of Paying Living Expenses: Not hard at all  Recent Concern: Financial Resource Strain - High Risk (03/30/2022)   Overall Financial Resource Strain (CARDIA)    Difficulty of Paying Living Expenses: Very hard  Food Insecurity: No Food Insecurity (07/12/2023)   Hunger Vital Sign    Worried About Running Out of Food in the Last Year: Never true    Ran Out of Food in the Last Year: Never true  Transportation Needs: No Transportation Needs (07/12/2023)   PRAPARE - Administrator, Civil Service (Medical): No    Lack of Transportation (Non-Medical): No  Physical Activity: Sufficiently Active (05/07/2022)   Exercise Vital Sign    Days of Exercise per Week: 5 days    Minutes of Exercise per Session: 30 min  Stress: Stress Concern Present (05/07/2022)   [employer] of Occupational Health - Occupational Stress Questionnaire    Feeling of Stress : Very much  Social Connections: Unknown (03/03/2022)   Received from [facility]   Social Network    Social Network: Not on file  Intimate Partner Violence: Not At Risk (05/07/2022)   Humiliation, Afraid, Rape, and Kick  questionnaire    Fear of Current or Ex-Partner: No  Emotionally Abused: No    Physically Abused: No    Sexually Abused: No    Review of Systems:    Constitutional: No weight loss, fever or chills Skin: No rash Cardiovascular: No chest pain  Respiratory: No SOB  Gastrointestinal: See HPI and otherwise negative Genitourinary: No dysuria Neurological: No headache, dizziness or syncope Musculoskeletal: No new muscle or joint pain Hematologic: No bleeding  Psychiatric: No history of depression or anxiety   Physical Exam:  Vital signs: BP 124/72   Pulse 72   Ht 5\' 3"  (1.6 m)   Wt 257 lb 6 oz (116.7 kg)   BMI 45.59 kg/m    Constitutional:   Pleasant overweight [name] appears to be in NAD, Well developed, Well nourished, alert and cooperative Head:  Normocephalic and atraumatic. Eyes:   PEERL, EOMI. No icterus. Conjunctiva pink. Ears:  Normal auditory acuity. Neck:  Supple Throat: Oral cavity and pharynx without inflammation, swelling or lesion.  Respiratory: Respirations even and unlabored. Lungs clear to auscultation bilaterally.   No wheezes, crackles, or rhonchi.  Cardiovascular: Normal S1, S2. No MRG. Regular rate and rhythm. No peripheral edema, cyanosis or pallor.  Gastrointestinal:  Soft, nondistended, nontender. No rebound or guarding. Normal bowel sounds. No appreciable masses or hepatomegaly. Rectal:  Not performed.  Msk:  Symmetrical without gross deformities. Without edema, no deformity or joint abnormality.  Neurologic:  Alert and  oriented x4;  grossly normal neurologically.  Skin:   Dry and intact without significant lesions or rashes. Psychiatric: Demonstrates good judgement and reason without abnormal affect or behaviors.  RELEVANT LABS AND IMAGING: CBC    Component Value Date/Time   WBC 6.1 08/06/2023 0900   WBC 5.9 06/27/2023 2018   RBC 3.87 08/06/2023 0900   RBC 3.58 (L) 06/27/2023 2018   HGB 11.5 08/06/2023 0900   HGB 12.2 09/15/2016 1441   HCT  38.0 08/06/2023 0900   HCT 37.5 09/15/2016 1441   PLT 229 08/06/2023 0900   MCV 98 (H) 08/06/2023 0900   MCV 92.6 09/15/2016 1441   MCH 29.7 08/06/2023 0900   MCH 30.7 06/27/2023 2018   MCHC 30.3 (L) 08/06/2023 0900   MCHC 31.3 06/27/2023 2018   RDW 13.6 08/06/2023 0900   RDW 14.8 (H) 09/15/2016 1441   LYMPHSABS 2.0 06/27/2023 2018   LYMPHSABS 3.0 11/25/2017 1123   LYMPHSABS 3.2 09/15/2016 1441   MONOABS 0.5 06/27/2023 2018   MONOABS 0.6 09/15/2016 1441   EOSABS 0.0 06/27/2023 2018   EOSABS 0.1 11/25/2017 1123   BASOSABS 0.0 06/27/2023 2018   BASOSABS 0.0 11/25/2017 1123   BASOSABS 0.0 09/15/2016 1441    CMP     Component Value Date/Time   NA 141 08/06/2023 0900   NA 142 09/15/2016 1441   K 4.6 08/06/2023 0900   K 3.8 09/15/2016 1441   CL 105 08/06/2023 0900   CO2 21 08/06/2023 0900   CO2 28 09/15/2016 1441   GLUCOSE 147 (H) 08/06/2023 0900   GLUCOSE 116 (H) 07/22/2023 1118   GLUCOSE 196 (H) 09/15/2016 1441   BUN 25 08/06/2023 0900   BUN 31.4 (H) 09/15/2016 1441   CREATININE 1.37 (H) 08/06/2023 0900   CREATININE 1.02 (H) 11/18/2016 1440   CREATININE 1.3 (H) 09/15/2016 1441   CALCIUM 9.6 08/06/2023 0900   CALCIUM 10.2 09/15/2016 1441   PROT 7.4 06/27/2023 2018   PROT 7.6 11/25/2017 1123   PROT 8.3 09/15/2016 1441   ALBUMIN 3.6 06/27/2023 2018   ALBUMIN 4.2 11/25/2017 1123  ALBUMIN 3.7 09/15/2016 1441   AST 11 (L) 06/27/2023 2018   AST 19 09/15/2016 1441   ALT 9 06/27/2023 2018   ALT 20 09/15/2016 1441   ALKPHOS 69 06/27/2023 2018   ALKPHOS 95 09/15/2016 1441   BILITOT 0.8 06/27/2023 2018   BILITOT 0.4 11/25/2017 1123   BILITOT 0.47 09/15/2016 1441   GFRNONAA >60 06/27/2023 2018   GFRAA 43 (L) 12/09/2020 1413    Assessment: 1.  Recurrent diverticulitis: Seen in the ER on 2 separate occasions with CT confirming diverticulitis treated with Augmentin, no current pain, finished last antibiotics on 07/07/2023 2.  Constipation: Recently changed to constipation  ever since starting [facility], she has tried this on 2 separate occasions and both times this ended up with diverticulitis, likely related to medication side effect and constipation 3.  History of adenomatous polyps: Last colonoscopy 07/2019 with repeat recommended in 3 years, patient is overdue  Plan: 1.  Patient will be scheduled for surveillance colonoscopy given history of adenomatous polyps with Dr. Adela Lank in the [facility].  Did provide the patient a detailed list of risks for the procedure and she agrees to proceed.  Will ensure this is at least 6 to 8 weeks out from last antibiotics for diverticulitis. Patient is appropriate for endoscopic procedure(s) in the ambulatory ([facility]) setting.  2.  Patient will need to hold her Eliquis for 2 days prior to time of procedure.  We will communicate with her prescribing physician to ensure this is acceptable for her. 3.  Discussed with patient that she needs to hold [facility] for 1 week prior to procedure. 4.  Patient to follow in clinic per recommendations after time of procedure.  Hyacinth Meeker, PA-C [facility] 08/19/2023, 1:10 PM  Cc: [name], MD

## 2023-08-20 ENCOUNTER — Telehealth: Payer: Self-pay

## 2023-08-20 NOTE — Progress Notes (Signed)
Agree with assessment and plan as outlined.  

## 2023-08-20 NOTE — Telephone Encounter (Signed)
Request for surgical clearance:     Endoscopy Procedure  What type of surgery is being performed?     Colonoscopy   When is this surgery scheduled?     10/06/23  What type of clearance is required ?   Pharmacy  Are there any medications that need to be held prior to surgery and how long? Eliquis x2 days prior to procedure.  Practice name and name of physician performing surgery?      [facility]  What is your office phone and fax number?      Phone- [phone]  Fax- [phone]  Anesthesia type (None, local, MAC, general) ?       MAC

## 2023-08-23 ENCOUNTER — Ambulatory Visit (HOSPITAL_COMMUNITY)
Admission: RE | Admit: 2023-08-23 | Discharge: 2023-08-23 | Disposition: A | Discharge status: Home / Self Care | Payer: 59 | Source: Ambulatory Visit | Attending: Physician Assistant | Admitting: Internal Medicine

## 2023-08-23 ENCOUNTER — Ambulatory Visit: Payer: Self-pay | Admitting: Licensed Clinical Social Worker

## 2023-08-23 VITALS — BP 122/80 | HR 70 | Ht 63.0 in | Wt 260.0 lb

## 2023-08-23 DIAGNOSIS — Z79899 Other long term (current) drug therapy: Secondary | ICD-10-CM | POA: Insufficient documentation

## 2023-08-23 DIAGNOSIS — Z7902 Long term (current) use of antithrombotics/antiplatelets: Secondary | ICD-10-CM | POA: Diagnosis not present

## 2023-08-23 DIAGNOSIS — I110 Hypertensive heart disease with heart failure: Secondary | ICD-10-CM | POA: Diagnosis not present

## 2023-08-23 DIAGNOSIS — Z7182 Exercise counseling: Secondary | ICD-10-CM | POA: Diagnosis not present

## 2023-08-23 DIAGNOSIS — I4819 Other persistent atrial fibrillation: Secondary | ICD-10-CM | POA: Diagnosis not present

## 2023-08-23 DIAGNOSIS — I4891 Unspecified atrial fibrillation: Secondary | ICD-10-CM | POA: Diagnosis not present

## 2023-08-23 DIAGNOSIS — E669 Obesity, unspecified: Secondary | ICD-10-CM | POA: Insufficient documentation

## 2023-08-23 DIAGNOSIS — I5032 Chronic diastolic (congestive) heart failure: Secondary | ICD-10-CM | POA: Diagnosis not present

## 2023-08-23 DIAGNOSIS — E785 Hyperlipidemia, unspecified: Secondary | ICD-10-CM | POA: Insufficient documentation

## 2023-08-23 DIAGNOSIS — D6869 Other thrombophilia: Secondary | ICD-10-CM | POA: Insufficient documentation

## 2023-08-23 DIAGNOSIS — Z6841 Body Mass Index (BMI) 40.0 and over, adult: Secondary | ICD-10-CM | POA: Insufficient documentation

## 2023-08-23 MED ORDER — AMIODARONE HCL 200 MG PO TABS: ORAL_TABLET | ORAL | 3 refills | Status: DC

## 2023-08-23 NOTE — Telephone Encounter (Signed)
Stephanie [name],  You saw this patient on 08/06/2023. Per office protocol, will you please comment on medical clearance for colonoscopy on 12/18?  Please route your response to P CV DIV Preop. I will communicate with requesting office once you have given recommendations.   Thank you!  Carlos Levering, NP

## 2023-08-23 NOTE — Telephone Encounter (Signed)
Please advise holding Eliquis prior to colonoscopy on 12/18  Thank you!  DW

## 2023-08-23 NOTE — Progress Notes (Addendum)
Primary Care Physician: [name], MD Primary Electrophysiologist: Dr Graciela Husbands Referring Physician: Dr Graciela Husbands   Stephanie [name] is a 72 y.o. female with a history of HTN, tachycardia mediated CM, HLD, sarcoidosis, and persistent atrial fibrillation who presents for follow up in the [facility]. The patient was initially diagnosed with atrial fibrillation remotely. She has had DCCV in 2014, 2018, and 03/04/20. She was seen for afib ablation consideration in 2015 but opted not to proceed. Previously maintained on amiodarone but this was stopped 2/2 tremors and hyperthyroidism. Patient is on Pradaxa for a CHADS2VASC score of 4. She was seen 04/05/20 after DCCV and was back in afib. Repeat echo showed decreased EF of 45-50% and dofetilide was recommended. Unfortunately, dofetilide admissions were temporarily on hold 2/2 increase in COVID-19 hospitalizations.   On follow up today, patient was admitted 12/6 for dofetilide loading. Unfortunately, her QT became too prolonged and the medication had to be stopped. She did convert to SR after the first dose. She was started on Multaq. She had recurrence of her afib and was scheduled for DCCV on 10/09/20 but presented in SR. She was seen by Dr Graciela Husbands on 12/24/21 and was found to be back in afib with symptoms of fluid overload. This DCCV was also cancelled as she was back in SR.   Patient was seen 01/26/22 at the ED with abdominal pain and was found to have mild-moderate diverticulitis and started on augmentin. She was back in rate controlled afib.   On follow up today, patient reports that she has felt well since her last visit. Her diverticulitis has resolved. She does report she has been under considerable stress with family members being in and out of the hospital. She denies any bleeding issues on anticoagulation.    On follow up 08/23/23, she is currently in Afib. S/p successful DCCV on 08/16/23. She believes that previously she was  on multiple medications that led to gait issues / tremors at the time of also being on amiodarone. She would like to rechallenge amiodarone. She missed a dose of Eliquis last week and is scheduled for a colonoscopy on 12/18.   Today, she denies symptoms of palpitations, chest pain, shortness of breath, orthopnea, PND, lower extremity edema, dizziness, presyncope, syncope, snoring, daytime somnolence, bleeding, or neurologic sequela. The patient is tolerating medications without difficulties and is otherwise without complaint today.    Atrial Fibrillation Risk Factors:  she does not have symptoms or diagnosis of sleep apnea. she does not have a history of rheumatic fever. she does not have a history of alcohol use.   she has a BMI of Body mass index is 46.06 kg/m.Marland Kitchen Filed Weights   08/23/23 0907  Weight: 117.9 kg     Family History  Problem Relation Age of Onset   Pneumonia Father        deceased   Hypertension Father    Cancer Father    Multiple sclerosis Mother        hx   Emphysema Other        runs in the family     Atrial Fibrillation Management history:  Previous antiarrhythmic drugs: amiodarone, dofetilide (QT prolong), Multaq Previous cardioversions: 2014, 2018, 03/04/20, 08/16/23 Previous ablations: none CHADS2VASC score: 4 Anticoagulation history: Pradaxa    Past Medical History:  Diagnosis Date   Anemia    Ankle fracture, right    x 2   Antineoplastic and immunosuppressive drugs causing adverse effect in therapeutic use  Asthma    Chronic venous hypertension without complications    Colon polyps 2009   Coronary artery disease    no CAD by cath 11.2010   Depressive disorder, not elsewhere classified    Diabetes mellitus type 2 in obese    hgb AIC 6.1 09/02/2009, insulin dependent    Diastolic CHF, chronic (HCC)    reports stable   Diverticulosis of colon 2009   colonoscopy   Gout    cortisone  shots helped   Hepatitis, unspecified    hx of/per  pt, never was told of having hepatitis   Hx of hysterectomy 2002   Hyperlipidemia    Hypertension    controlled on medication    Hypothyroidism    reports her thryoid levels are normal now    Irritable bowel syndrome    Kidney disease    Lung nodule    12mm RLL nodule on CT Chest  07/2009, resolved by 2011 CT   Noncompliance    Obesity    Persistent atrial fibrillation (HCC)    s/p DCCV 07/2009; Pradaxa Rx, states she is in sinus rhythm now    PONV (postoperative nausea and vomiting)    after receiving "too much anesthesia" after a hernia surgery    Sarcoidosis    dx by eye exam; seen during eye exam , report asymtptomatic "i dont have it now'    Sleep disturbance 06/2013   sleep study, mild abnormality, no criteria for CPAP   Past Surgical History:  Procedure Laterality Date   ABDOMINAL HYSTERECTOMY     CARDIOVERSION     x 2   CARDIOVERSION N/A 02/27/2013   Procedure: CARDIOVERSION;  Surgeon: Lewayne Bunting, MD;  Location[facility] ENDOSCOPY;  Service: Cardiovascular;  Laterality: N/A;   CARDIOVERSION N/A 03/24/2017   Procedure: CARDIOVERSION;  Surgeon: Wendall Stade, MD;  Location: [facility] ENDOSCOPY;  Service: Cardiovascular;  Laterality: N/A;   CARDIOVERSION N/A 03/04/2020   Procedure: CARDIOVERSION;  Surgeon: Wendall Stade, MD;  Location: [facility] ENDOSCOPY;  Service: Cardiovascular;  Laterality: N/A;   CARDIOVERSION N/A 06/04/2023   Procedure: CARDIOVERSION;  Surgeon: Sande Rives, MD;  Location: [facility] INVASIVE CV LAB;  Service: Cardiovascular;  Laterality: N/A;   CARDIOVERSION N/A 08/16/2023   Procedure: CARDIOVERSION;  Surgeon: Chrystie Nose, MD;  Location: [facility] INVASIVE CV LAB;  Service: Cardiovascular;  Laterality: N/A;   CHOLECYSTECTOMY  early 80s   COLONOSCOPY     Diverticulosis, colon polyps, repeat 2014, Dr. Arlyce Dice   COLONOSCOPY WITH PROPOFOL N/A 07/25/2019   Procedure: COLONOSCOPY WITH PROPOFOL;  Surgeon: Benancio Deeds, MD;  Location: [facility] ENDOSCOPY;  Service:  Gastroenterology;  Laterality: N/A;   ESOPHAGOGASTRODUODENOSCOPY N/A 05/16/2014   Procedure: ESOPHAGOGASTRODUODENOSCOPY (EGD);  Surgeon: Graylin Shiver, MD;  Location: [facility] ORS;  Service: Gastroenterology;  Laterality: N/A;   HERNIA REPAIR  2009   umbilical hernia   MYOMECTOMY  2000   POLYPECTOMY  07/25/2019   Procedure: POLYPECTOMY;  Surgeon: Benancio Deeds, MD;  Location: [facility] ENDOSCOPY;  Service: Gastroenterology;;    Current Outpatient Medications  Medication Sig Dispense Refill   Accu-Chek FastClix Lancets MISC      ALPRAZolam (XANAX) 0.5 MG tablet Take 1 tablet (0.5 mg total) by mouth 3 (three) times daily as needed for anxiety. (Patient taking differently: Take 0.5 mg by mouth at bedtime as needed for anxiety or sleep.) 270 tablet 0   amiodarone (PACERONE) 200 MG tablet Take 1 tablet (200 mg total) by mouth 2 (two) times daily  for 30 days, THEN 1 tablet (200 mg total) daily. 90 tablet 3   apixaban (ELIQUIS) 5 MG TABS tablet Take 1 tablet (5 mg total) by mouth 2 (two) times daily. 60 tablet 5   Blood Pressure KIT 1 kit by Does not apply route daily. Check blood pressure once a day 1 kit 0   colchicine 0.6 MG tablet Take 0.6 mg by mouth daily as needed (gout flares).     Finerenone (KERENDIA) 20 MG TABS Take 1 tablet (20 mg total) by mouth daily. 90 tablet 0   furosemide (LASIX) 40 MG tablet Take 1 tablet (40 mg total) by mouth daily. (Patient taking differently: Take 40 mg by mouth as needed.) 90 tablet 1   JARDIANCE 25 MG TABS tablet TAKE 1 TABLET(25 MG) BY MOUTH DAILY (Patient taking differently: Take 25 mg by mouth daily.) 90 tablet 1   metoprolol succinate (TOPROL-XL) 25 MG 24 hr tablet TAKE 1 TABLET(25 MG) BY MOUTH DAILY (Patient taking differently: Take 25 mg by mouth daily in the afternoon.) 90 tablet 3   Na Sulfate-K Sulfate-Mg Sulf (SUPREP BOWEL PREP KIT) 17.5-3.13-1.6 GM/177ML SOLN Take 1 kit by mouth as directed. For colonoscopy prep 354 mL 0   sacubitril-valsartan (ENTRESTO)  24-26 MG TAKE 1 TABLET BY MOUTH TWICE DAILY 180 tablet 3   simvastatin (ZOCOR) 10 MG tablet Take 1 tablet (10 mg total) by mouth daily. (Patient taking differently: Take 10 mg by mouth at bedtime.) 90 tablet 1   terbinafine (LAMISIL) 1 % cream Apply 1 Application topically 2 (two) times daily as needed (skin irritation/itching.).     allopurinol (ZYLOPRIM) 100 MG tablet TAKE 1/2 TABLET(50 MG) BY MOUTH TWICE DAILY (Patient not taking: Reported on 08/06/2023) 90 tablet 3   potassium chloride SA (KLOR-CON M15) 15 MEQ tablet Take 1 tablet (15 mEq total) by mouth 3 (three) times daily. (Patient not taking: Reported on 08/23/2023) 270 tablet 0   tirzepatide (MOUNJARO) 5 MG/0.5ML Pen Inject 5 mg into the skin once a week. (Patient not taking: Reported on 08/23/2023) 6 mL 0   No current facility-administered medications for this encounter.    Allergies  Allergen Reactions   Amiodarone Other (See Comments)    Tremor/gait disurbance   Aspirin Other (See Comments)    "Burns my stomach"   Methimazole Nausea And Vomiting   Propoxyphene Nausea And Vomiting   Trintellix [Vortioxetine] Nausea And Vomiting   Fentanyl Nausea And Vomiting   Albuterol Palpitations   Lipitor [Atorvastatin] Other (See Comments)    Body aches and pains--stiffness.    Livalo [Pitavastatin] Other (See Comments)    Body aches    ROS- All systems are reviewed and negative except as per the HPI above.  Physical Exam: Vitals:   08/23/23 0907  BP: 122/80  Pulse: 70  Weight: 117.9 kg  Height: 5\' 3"  (1.6 m)    GEN- The patient is well appearing, alert and oriented x 3 today.   Neck - no JVD or carotid bruit noted Lungs- Clear to ausculation bilaterally, normal work of breathing Heart- Irregular rate and rhythm, no murmurs, rubs or gallops, PMI not laterally displaced Extremities- no clubbing, cyanosis, or edema Skin - no rash or ecchymosis noted   Wt Readings from Last 3 Encounters:  08/23/23 117.9 kg  08/19/23 116.7  kg  08/16/23 117 kg    EKG today demonstrates  Vent. rate 70 BPM PR interval * ms QRS duration 108 ms QT/QTcB 438/473 ms P-R-T axes * -8 -19 Atrial  fibrillation Minimal voltage criteria for LVH, may be normal variant ( R in aVL ) Cannot rule out Anterior infarct , age undetermined Abnormal ECG When compared with ECG of 16-Aug-2023 08:40, PREVIOUS ECG IS PRESENT  Echo 05/28/20 demonstrated   1. Left ventricular ejection fraction, by estimation, is 45 to 50%. The  left ventricle has mildly decreased function. The left ventricle  demonstrates global hypokinesis. There is moderate concentric left  ventricular hypertrophy. Left ventricular diastolic parameters are indeterminate due to underlying atrial fibrillation.   2. Right ventricular systolic function is mildly reduced. The right  ventricular size is normal. Tricuspid regurgitation signal is inadequate  for assessing PA pressure.   3. Left atrial size was mildly dilated.   4. The mitral valve is normal in structure. Trivial mitral valve  regurgitation. No evidence of mitral stenosis.   5. The aortic valve is tricuspid. Aortic valve regurgitation is not  visualized. Mild aortic valve sclerosis is present, with no evidence of  aortic valve stenosis.   6. The inferior vena cava is normal in size with greater than 50%  respiratory variability, suggesting right atrial pressure of 3 mmHg.   Epic records are reviewed at length today  CHA2DS2-VASc Score =    The patient's score is based upon:  4.8%        ASSESSMENT AND PLAN: 1. Persistent Atrial Fibrillation (ICD10:  I48.19) The patient's CHA2DS2-VASc score is 4 , indicating a  4.8% annual risk of stroke.   Patient s/p unsuccessful dofetilide loading 2/2 QT prolongation. She failed amiodarone with neurologic and thyroid side effects. Would avoid class IC with reduced EF.   She is in afib today and would like to rechallenge amiodarone. Stop Multaq today. We can proceed  cautiously and have her restart amiodarone therapy. Unfortunately, due to missing Eliquis last week and upcoming colonoscopy, would recommend she stop Multaq now and can start amiodarone 10/29/23 after 3 weeks of uninterrupted anticoagulation. If she develops symptoms after starting amiodarone will stop medication and consider rate control as primary strategy. Patient was upset at end of office visit due to inability to start amiodarone today. I explained the reasoning behind due to requiring 3 weeks of uninterrupted anticoagulation for patient safety in light of potential chemical conversion to normal rhythm.  Continue Pradaxa 150 mg BID Continue Coreg 25 mg BID  2. Secondary Hypercoagulable State (ICD10:  D68.69) The patient is at significant risk for stroke/thromboembolism based upon her CHA2DS2-VASc Score of  4.  Continue Dabigatran (Pradaxa).   3. Obesity Body mass index is 46.06 kg/m. Lifestyle modification was discussed and encouraged including regular physical activity and weight reduction.  4. NICM Suspected tachycardia mediated. Appears euvolemic today.  5. HTN Stable, no changes today.   Follow up 2-3 weeks after amiodarone start.   Justin Mend, PA-C [facility] [address] [place], [place] [address] [phone] 08/23/2023 9:45 AM

## 2023-08-23 NOTE — Patient Instructions (Addendum)
STOP multaq   On January 10th -- Start amiodarone 200mg  twice a day with food for 1 month then reduce to once a day  Do NOT miss doses of your Eliquis - if you do please notify the office

## 2023-08-23 NOTE — Patient Outreach (Signed)
  Care Coordination   Follow Up Visit Note   08/23/2023 Name: Stephanie [name] MRN: [id number] DOB: 03-22-1951  Stephanie [name] is a 72 y.o. year old female who sees [name], MD for primary care. I spoke with  [name] by phone today.  What matters to the patients health and wellness today?  Patient is not available. States recent letter showed appt for 11/5. Pt agreed to r/s appt for 11/6   SDOH assessments and interventions completed:  No     Care Coordination Interventions:  Yes, provided   Follow up plan: Follow up call scheduled for 11/6    Encounter Outcome:  Patient Request to Call Back   [name], MSW, LCSW [facility] Care Management [facility]  [facility] [email or web address] Phone [phone] 4:02 PM

## 2023-08-24 ENCOUNTER — Ambulatory Visit: Payer: 59 | Admitting: Internal Medicine

## 2023-08-24 ENCOUNTER — Encounter: Payer: Self-pay | Admitting: Internal Medicine

## 2023-08-24 VITALS — BP 126/86 | HR 83 | Temp 97.7°F | Ht 63.0 in | Wt 260.6 lb

## 2023-08-24 DIAGNOSIS — H608X2 Other otitis externa, left ear: Secondary | ICD-10-CM

## 2023-08-24 DIAGNOSIS — Z7985 Long-term (current) use of injectable non-insulin antidiabetic drugs: Secondary | ICD-10-CM | POA: Diagnosis not present

## 2023-08-24 DIAGNOSIS — Z7984 Long term (current) use of oral hypoglycemic drugs: Secondary | ICD-10-CM | POA: Diagnosis not present

## 2023-08-24 DIAGNOSIS — I483 Typical atrial flutter: Secondary | ICD-10-CM

## 2023-08-24 DIAGNOSIS — E118 Type 2 diabetes mellitus with unspecified complications: Secondary | ICD-10-CM

## 2023-08-24 MED ORDER — TIRZEPATIDE 5 MG/0.5ML ~~LOC~~ SOAJ: 5.0000 mg | SUBCUTANEOUS | 0 refills | Status: DC

## 2023-08-24 MED ORDER — CORTISPORIN-TC 3.3-3-10-0.5 MG/ML OT SUSP: 3.0000 [drp] | Freq: Three times a day (TID) | OTIC | 1 refills | Status: DC

## 2023-08-24 NOTE — Progress Notes (Signed)
Subjective:  Patient ID: Stephanie [name], female    DOB: 1951/09/05  Age: 73 y.o. MRN: [id number]  CC: Diabetes and Atrial Fibrillation   HPI Stephanie [name] presents for f/up ----  Discussed the use of AI scribe software for clinical note transcription with the patient, who gave verbal consent to proceed.  History of Present Illness   The patient, with a history of cardiac arrhythmia, recently underwent a successful cardioversion. However, a follow-up visit revealed a return to fibrillation. The patient reported missing a day of her blood thinners due to running out of the medication. She does not feel her heart rhythm as she used to, but she has noticed a tingling sensation in her feet.  The patient also reported an itching sensation in her left ear, which she has been scratching frequently. She denied using any drops or inserting anything into the ear, suspecting that soap foam might have caused an allergic reaction.  The patient has been off Moderna for over a month and expressed a desire to restart the medication. She reported no side effects from previous use. She also mentioned stopping Advil due to concerns about kidney damage and has been taking Tylenol for knee pain. She has been drinking a lot of water and has stopped taking Advil.  The patient's kidney function was tested in October and was reported as stable. She has been adhering to her medication regimen, with the exception of the missed day of blood thinners.       Outpatient Medications Prior to Visit  Medication Sig Dispense Refill   ALPRAZolam (XANAX) 0.5 MG tablet Take 1 tablet (0.5 mg total) by mouth 3 (three) times daily as needed for anxiety. (Patient taking differently: Take 0.5 mg by mouth at bedtime as needed for anxiety or sleep.) 270 tablet 0   apixaban (ELIQUIS) 5 MG TABS tablet Take 1 tablet (5 mg total) by mouth 2 (two) times daily. 60 tablet 5   colchicine 0.6 MG tablet Take 0.6 mg by mouth daily as needed  (gout flares).     Finerenone (KERENDIA) 20 MG TABS Take 1 tablet (20 mg total) by mouth daily. 90 tablet 0   furosemide (LASIX) 40 MG tablet Take 1 tablet (40 mg total) by mouth daily. (Patient taking differently: Take 40 mg by mouth as needed.) 90 tablet 1   metoprolol succinate (TOPROL-XL) 25 MG 24 hr tablet TAKE 1 TABLET(25 MG) BY MOUTH DAILY (Patient taking differently: Take 25 mg by mouth daily in the afternoon.) 90 tablet 3   sacubitril-valsartan (ENTRESTO) 24-26 MG TAKE 1 TABLET BY MOUTH TWICE DAILY 180 tablet 3   simvastatin (ZOCOR) 10 MG tablet Take 1 tablet (10 mg total) by mouth daily. (Patient taking differently: Take 10 mg by mouth at bedtime.) 90 tablet 1   terbinafine (LAMISIL) 1 % cream Apply 1 Application topically 2 (two) times daily as needed (skin irritation/itching.).     Accu-Chek FastClix Lancets MISC  (Patient not taking: Reported on 08/24/2023)     allopurinol (ZYLOPRIM) 100 MG tablet TAKE 1/2 TABLET(50 MG) BY MOUTH TWICE DAILY (Patient not taking: Reported on 08/06/2023) 90 tablet 3   amiodarone (PACERONE) 200 MG tablet Take 1 tablet (200 mg total) by mouth 2 (two) times daily for 30 days, THEN 1 tablet (200 mg total) daily. (Patient not taking: Reported on 08/24/2023) 90 tablet 3   Blood Pressure KIT 1 kit by Does not apply route daily. Check blood pressure once a day (Patient not taking: Reported on 08/24/2023)  1 kit 0   JARDIANCE 25 MG TABS tablet TAKE 1 TABLET(25 MG) BY MOUTH DAILY (Patient not taking: Reported on 08/24/2023) 90 tablet 1   Na Sulfate-K Sulfate-Mg Sulf (SUPREP BOWEL PREP KIT) 17.5-3.13-1.6 GM/177ML SOLN Take 1 kit by mouth as directed. For colonoscopy prep (Patient not taking: Reported on 08/24/2023) 354 mL 0   potassium chloride SA (KLOR-CON M15) 15 MEQ tablet Take 1 tablet (15 mEq total) by mouth 3 (three) times daily. (Patient not taking: Reported on 08/23/2023) 270 tablet 0   tirzepatide (MOUNJARO) 5 MG/0.5ML Pen Inject 5 mg into the skin once a week.  (Patient not taking: Reported on 08/23/2023) 6 mL 0   No facility-administered medications prior to visit.    ROS Review of Systems  Constitutional: Negative.  Negative for diaphoresis and fatigue.  HENT:  Negative for ear pain and sore throat.        Left ear itches  Eyes: Negative.  Negative for visual disturbance.  Respiratory: Negative.  Negative for chest tightness, shortness of breath and wheezing.   Cardiovascular:  Negative for chest pain, palpitations and leg swelling.  Gastrointestinal:  Negative for abdominal pain, diarrhea, nausea and vomiting.  Genitourinary: Negative.  Negative for difficulty urinating.  Musculoskeletal:  Positive for arthralgias. Negative for myalgias.  Skin: Negative.   Neurological: Negative.  Negative for dizziness and weakness.  Hematological:  Negative for adenopathy. Does not bruise/bleed easily.  Psychiatric/Behavioral:  Positive for confusion and decreased concentration. The patient is not nervous/anxious.     Objective:  BP 126/86 (BP Location: Left Arm, Patient Position: Sitting, Cuff Size: Large)   Pulse 83   Temp 97.7 F (36.5 C) (Oral)   Ht 5\' 3"  (1.6 m)   Wt 260 lb 9.6 oz (118.2 kg)   SpO2 96%   BMI 46.16 kg/m   BP Readings from Last 3 Encounters:  08/24/23 126/86  08/23/23 122/80  08/19/23 124/72    Wt Readings from Last 3 Encounters:  08/24/23 260 lb 9.6 oz (118.2 kg)  08/23/23 260 lb (117.9 kg)  08/19/23 257 lb 6 oz (116.7 kg)    Physical Exam Vitals reviewed.  Constitutional:      Appearance: Normal appearance.  HENT:     Right Ear: Hearing, tympanic membrane and ear canal normal.     Left Ear: Hearing, tympanic membrane and external ear normal.     Ears:     Comments: Left EAC - +++ scaling, no swelling/erythema/exudate Eyes:     General: No scleral icterus.    Conjunctiva/sclera: Conjunctivae normal.  Cardiovascular:     Rate and Rhythm: Normal rate and regular rhythm.     Heart sounds: No murmur  heard. Pulmonary:     Effort: Pulmonary effort is normal.     Breath sounds: No stridor. No wheezing, rhonchi or rales.  Abdominal:     General: Abdomen is flat.     Palpations: There is no mass.     Tenderness: There is no abdominal tenderness. There is no guarding.     Hernia: No hernia is present.  Musculoskeletal:        General: Normal range of motion.     Cervical back: Neck supple.     Right lower leg: No edema.     Left lower leg: No edema.  Lymphadenopathy:     Cervical: No cervical adenopathy.  Skin:    General: Skin is warm and dry.  Neurological:     General: No focal deficit present.  Mental Status: She is alert.  Psychiatric:        Mood and Affect: Mood normal.        Behavior: Behavior normal.     Lab Results  Component Value Date   WBC 6.1 08/06/2023   HGB 11.5 08/06/2023   HCT 38.0 08/06/2023   PLT 229 08/06/2023   GLUCOSE 147 (H) 08/06/2023   CHOL 208 (H) 04/19/2023   TRIG 117.0 04/19/2023   HDL 59.70 04/19/2023   LDLCALC 125 (H) 04/19/2023   ALT 9 06/27/2023   AST 11 (L) 06/27/2023   NA 141 08/06/2023   K 4.6 08/06/2023   CL 105 08/06/2023   CREATININE 1.37 (H) 08/06/2023   BUN 25 08/06/2023   CO2 21 08/06/2023   TSH 3.28 04/19/2023   INR 1.2 (H) 04/05/2013   HGBA1C 5.7 07/22/2023   MICROALBUR <0.7 05/24/2023    No results found.  Assessment & Plan:   Chronic eczematous otitis externa of left ear -     Cortisporin-TC; Place 3 drops into the left ear 3 (three) times daily.  Dispense: 10 mL; Refill: 1  Type II diabetes mellitus with complication (HCC) - Her blood sugar is well controlled. -     Tirzepatide; Inject 5 mg into the skin once a week.  Dispense: 6 mL; Refill: 0  Typical atrial flutter (HCC)- She has good R/R control.     Follow-up: Return in about 4 months (around 12/22/2023).  Sanda Linger, MD

## 2023-08-24 NOTE — Patient Instructions (Signed)
Otitis Externa  Otitis externa is an infection of the outer ear canal. The outer ear canal is the area between the outside of the ear and the eardrum. Otitis externa is sometimes called swimmer's ear. What are the causes? Common causes of this condition include: Swimming in dirty water. Moisture in the ear. An injury to the inside of the ear. An object stuck in the ear. A cut or scrape on the outside of the ear or in the ear canal. What increases the risk? You are more likely to develop this condition if you go swimming often. What are the signs or symptoms? The first symptom of this condition is often itching in the ear. Later symptoms of the condition include: Swelling of the ear. Redness in the ear. Ear pain. The pain may get worse when you pull on your ear. Pus coming from the ear. How is this diagnosed? This condition may be diagnosed by examining the ear and testing fluid from the ear for bacteria and funguses. How is this treated? This condition may be treated with: Antibiotic ear drops. These are often given for 10-14 days. Medicines to reduce itching and swelling. Follow these instructions at home: If you were prescribed antibiotic ear drops, use them as told by your health care provider. Do not stop using the antibiotic even if you start to feel better. Take over-the-counter and prescription medicines only as told by your health care provider. Avoid getting water in your ears as told by your health care provider. This may include avoiding swimming or water sports for a few days. Keep all follow-up visits. This is important. How is this prevented? Keep your ears dry. Use the corner of a towel to dry your ears after you swim or bathe. Avoid scratching or putting things in your ear. Doing these things can damage the ear canal or remove the protective wax that lines it, which makes it easier for bacteria and funguses to grow. Avoid swimming in lakes, polluted water, or swimming  pools that may not have enough chlorine. Contact a health care provider if: You have a fever. Your ear is still red, swollen, painful, or draining pus after 3 days. Your redness, swelling, or pain gets worse. You have a severe headache. Get help right away if: You have redness, swelling, and pain or tenderness in the area behind your ear. Summary Otitis externa is an infection of the outer ear canal. Common causes include swimming in dirty water, moisture in the ear, or a cut or scrape in the ear. Symptoms include pain, redness, and swelling of the ear canal. If you were prescribed antibiotic ear drops, use them as told by your health care provider. Do not stop using the antibiotic even if you start to feel better. This information is not intended to replace advice given to you by your health care provider. Make sure you discuss any questions you have with your health care provider. Document Revised: 12/18/2020 Document Reviewed: 12/18/2020 Elsevier Patient Education  2024 Elsevier Inc.  

## 2023-08-25 ENCOUNTER — Encounter: Payer: Self-pay | Admitting: Licensed Clinical Social Worker

## 2023-08-25 ENCOUNTER — Telehealth: Payer: Self-pay | Admitting: Licensed Clinical Social Worker

## 2023-08-25 NOTE — Patient Outreach (Signed)
  Care Coordination   08/25/2023 Name: Stephanie [name] MRN: [id number] DOB: 11-10-1950   Care Coordination Outreach Attempts:  An unsuccessful telephone outreach was attempted for a scheduled appointment today.  Follow Up Plan:  Additional outreach attempts will be made to offer the patient care coordination information and services.   Encounter Outcome:  No Answer   Care Coordination Interventions:  No, not indicated    [name], MSW, LCSW [facility] Care Management [place]  [facility] [name] Phone [phone] 4:26 PM

## 2023-08-25 NOTE — Telephone Encounter (Signed)
   Patient Name: Stephanie [name]  DOB: 1951-07-22 MRN: [id number]  Primary Cardiologist: None  Chart reviewed as part of pre-operative protocol coverage. Given past medical history and time since last visit, based on ACC/AHA guidelines, Chriss Redel is at acceptable risk for the planned procedure without further cardiovascular testing.   The patient was advised that if she develops new symptoms prior to surgery to contact our office to arrange for a follow-up visit, and she verbalized understanding.  Cardioversion done on Aug 16, 2023. Four  weeks uninterrupted anticoagulation post cardioversion is recommended. Her colonoscopy is not till 10/06/23.  Per office protocol, patient can hold Eliquis for 2 days prior to procedure.    I will route this recommendation to the requesting party via Epic fax function and remove from pre-op pool.  Please call with questions.  Napoleon Form, Leodis Rains, NP 08/25/2023, 12:06 PM

## 2023-08-25 NOTE — Telephone Encounter (Signed)
Patient with diagnosis of atrial fibrilation on Eliquis for anticoagulation.    Procedure: colonoscopy Date of procedure: 10/06/2023   CHA2DS2-VASc Score = 6   This indicates a 9.7% annual risk of stroke. The patient's score is based upon: CHF History: 1 HTN History: 1 Diabetes History: 1 Stroke History: 0 Vascular Disease History: 1 Age Score: 1 Gender Score: 1    CrCl 64mL/min Platelet count 229   Cardioversion done on Aug 16, 2023. Four  weeks uninterrupted anticoagulation post cardioversion is recommended. Her colonoscopy is not till 10/06/23.  Per office protocol, patient can hold Eliquis for 2 days prior to procedure.     **This guidance is not considered finalized until pre-operative APP has relayed final recommendations.**

## 2023-09-03 ENCOUNTER — Other Ambulatory Visit: Payer: Self-pay | Admitting: Family Medicine

## 2023-09-06 ENCOUNTER — Ambulatory Visit: Payer: 59

## 2023-09-06 VITALS — Ht 63.0 in | Wt 260.0 lb

## 2023-09-06 DIAGNOSIS — Z0000 Encounter for general adult medical examination without abnormal findings: Secondary | ICD-10-CM

## 2023-09-06 DIAGNOSIS — Z780 Asymptomatic menopausal state: Secondary | ICD-10-CM

## 2023-09-06 NOTE — Progress Notes (Addendum)
Subjective:   Stephanie [name] is a 72 y.o. female who presents for Medicare Annual (Subsequent) preventive examination.  Visit Complete: Virtual I connected with  [name] on 09/21/23 by a audio enabled telemedicine application and verified that I am speaking with the correct person using two identifiers.  Patient Location: Home  Provider Location: Office/Clinic  I discussed the limitations of evaluation and management by telemedicine. The patient expressed understanding and agreed to proceed.  Vital Signs: Because this visit was a virtual/telehealth visit, some criteria may be missing or patient reported. Any vitals not documented were not able to be obtained and vitals that have been documented are patient reported.   Cardiac Risk Factors include: advanced age (>21men, >60 women);hypertension;diabetes mellitus;obesity (BMI >30kg/m2);Other (see comment), Risk factor comments: A-Fib, CHF, CKD     Objective:    Today's Vitals   09/06/23 0817 09/06/23 0820  Weight: 260 lb (117.9 kg)   Height: 5\' 3"  (1.6 m)   PainSc:  9    Body mass index is 46.06 kg/m.     09/06/2023    8:32 AM 06/04/2023    9:24 AM 05/03/2023    8:22 PM 06/18/2022    9:24 PM 09/23/2020   10:00 PM 03/04/2020   10:00 AM 01/02/2020    8:54 AM  Advanced Directives  Does Patient Have a Medical Advance Directive? No No No No No No No  Would patient like information on creating a medical advance directive?   No - Patient declined No - Patient declined No - Patient declined No - Patient declined No - Patient declined    Current Medications (verified) Outpatient Encounter Medications as of 09/06/2023  Medication Sig   allopurinol (ZYLOPRIM) 100 MG tablet TAKE 1/2 TABLET(50 MG) BY MOUTH TWICE DAILY   ALPRAZolam (XANAX) 0.5 MG tablet Take 1 tablet (0.5 mg total) by mouth 3 (three) times daily as needed for anxiety. (Patient taking differently: Take 0.5 mg by mouth at bedtime as needed for anxiety or sleep.)    apixaban (ELIQUIS) 5 MG TABS tablet Take 1 tablet (5 mg total) by mouth 2 (two) times daily.   colchicine 0.6 MG tablet Take 0.6 mg by mouth daily as needed (gout flares).   Finerenone (KERENDIA) 20 MG TABS Take 1 tablet (20 mg total) by mouth daily.   furosemide (LASIX) 40 MG tablet Take 1 tablet (40 mg total) by mouth daily. (Patient taking differently: Take 40 mg by mouth as needed.)   JARDIANCE 25 MG TABS tablet TAKE 1 TABLET(25 MG) BY MOUTH DAILY   metoprolol succinate (TOPROL-XL) 25 MG 24 hr tablet TAKE 1 TABLET(25 MG) BY MOUTH DAILY (Patient taking differently: Take 25 mg by mouth daily in the afternoon.)   neomycin-colistin-hydrocortisone-thonzonium (CORTISPORIN-TC) 3.12-19-08-0.5 MG/ML OTIC suspension Place 3 drops into the left ear 3 (three) times daily.   sacubitril-valsartan (ENTRESTO) 24-26 MG TAKE 1 TABLET BY MOUTH TWICE DAILY   simvastatin (ZOCOR) 10 MG tablet Take 1 tablet (10 mg total) by mouth daily. (Patient taking differently: Take 10 mg by mouth at bedtime.)   terbinafine (LAMISIL) 1 % cream Apply 1 Application topically 2 (two) times daily as needed (skin irritation/itching.).   tirzepatide (MOUNJARO) 5 MG/0.5ML Pen Inject 5 mg into the skin once a week.   Accu-Chek FastClix Lancets MISC  (Patient not taking: Reported on 08/24/2023)   amiodarone (PACERONE) 200 MG tablet Take 1 tablet (200 mg total) by mouth 2 (two) times daily for 30 days, THEN 1 tablet (200 mg total) daily. (  Patient not taking: Reported on 08/24/2023)   Blood Pressure KIT 1 kit by Does not apply route daily. Check blood pressure once a day (Patient not taking: Reported on 08/24/2023)   Na Sulfate-K Sulfate-Mg Sulf (SUPREP BOWEL PREP KIT) 17.5-3.13-1.6 GM/177ML SOLN Take 1 kit by mouth as directed. For colonoscopy prep (Patient not taking: Reported on 08/24/2023)   potassium chloride SA (KLOR-CON M15) 15 MEQ tablet Take 1 tablet (15 mEq total) by mouth 3 (three) times daily. (Patient not taking: Reported on 08/23/2023)    No facility-administered encounter medications on file as of 09/06/2023.    Allergies (verified) Amiodarone, Aspirin, Methimazole, Propoxyphene, Trintellix [vortioxetine], Fentanyl, Albuterol, Lipitor [atorvastatin], and Livalo [pitavastatin]   History: Past Medical History:  Diagnosis Date   Anemia    Ankle fracture, right    x 2   Antineoplastic and immunosuppressive drugs causing adverse effect in therapeutic use    Asthma    Chronic venous hypertension without complications    Colon polyps 2009   Coronary artery disease    no CAD by cath 11.2010   Depressive disorder, not elsewhere classified    Diabetes mellitus type 2 in obese    hgb AIC 6.1 09/02/2009, insulin dependent    Diastolic CHF, chronic (HCC)    reports stable   Diverticulosis of colon 2009   colonoscopy   Gout    cortisone  shots helped   Hepatitis, unspecified    hx of/per pt, never was told of having hepatitis   Hx of hysterectomy 2002   Hyperlipidemia    Hypertension    controlled on medication    Hypothyroidism    reports her thryoid levels are normal now    Irritable bowel syndrome    Kidney disease    Lung nodule    12mm RLL nodule on CT Chest  07/2009, resolved by 2011 CT   Noncompliance    Obesity    Persistent atrial fibrillation (HCC)    s/p DCCV 07/2009; Pradaxa Rx, states she is in sinus rhythm now    PONV (postoperative nausea and vomiting)    after receiving "too much anesthesia" after a hernia surgery    Sarcoidosis    dx by eye exam; seen during eye exam , report asymtptomatic "i dont have it now'    Sleep disturbance 06/2013   sleep study, mild abnormality, no criteria for CPAP   Past Surgical History:  Procedure Laterality Date   ABDOMINAL HYSTERECTOMY     CARDIOVERSION     x 2   CARDIOVERSION N/A 02/27/2013   Procedure: CARDIOVERSION;  Surgeon: Lewayne Bunting, MD;  Location: [facility] ENDOSCOPY;  Service: Cardiovascular;  Laterality: N/A;   CARDIOVERSION N/A 03/24/2017    Procedure: CARDIOVERSION;  Surgeon: Wendall Stade, MD;  Location: [facility] ENDOSCOPY;  Service: Cardiovascular;  Laterality: N/A;   CARDIOVERSION N/A 03/04/2020   Procedure: CARDIOVERSION;  Surgeon: Wendall Stade, MD;  Location[facility] ENDOSCOPY;  Service: Cardiovascular;  Laterality: N/A;   CARDIOVERSION N/A 06/04/2023   Procedure: CARDIOVERSION;  Surgeon: Sande Rives, MD;  Location: [facility] INVASIVE CV LAB;  Service: Cardiovascular;  Laterality: N/A;   CARDIOVERSION N/A 08/16/2023   Procedure: CARDIOVERSION;  Surgeon: Chrystie Nose, MD;  Location: [facility] INVASIVE CV LAB;  Service: Cardiovascular;  Laterality: N/A;   CHOLECYSTECTOMY  early 80s   COLONOSCOPY     Diverticulosis, colon polyps, repeat 2014, Dr. Arlyce Dice   COLONOSCOPY WITH PROPOFOL N/A 07/25/2019   Procedure: COLONOSCOPY WITH PROPOFOL;  Surgeon: Benancio Deeds,  MD;  Location: [facility] ENDOSCOPY;  Service: Gastroenterology;  Laterality: N/A;   ESOPHAGOGASTRODUODENOSCOPY N/A 05/16/2014   Procedure: ESOPHAGOGASTRODUODENOSCOPY (EGD);  Surgeon: Graylin Shiver, MD;  Location: [facility] ORS;  Service: Gastroenterology;  Laterality: N/A;   HERNIA REPAIR  2009   umbilical hernia   MYOMECTOMY  2000   POLYPECTOMY  07/25/2019   Procedure: POLYPECTOMY;  Surgeon: Benancio Deeds, MD;  Location: [facility] ENDOSCOPY;  Service: Gastroenterology;;   Family History  Problem Relation Age of Onset   Pneumonia Father        deceased   Hypertension Father    Cancer Father    Multiple sclerosis Mother        hx   Emphysema Other        runs in the family   Social History   Socioeconomic History   Marital status: Single    Spouse name: Not on file   Number of children: 5   Years of education: college   Highest education level: Not on file  Occupational History   Occupation: Disabled.  Tobacco Use   Smoking status: Never   Smokeless tobacco: Never   Tobacco comments:    Never smoke 01/27/22  Vaping Use   Vaping status: Never Used  Substance and  Sexual Activity   Alcohol use: Yes    Alcohol/week: 0.0 standard drinks of alcohol    Comment: rare   Drug use: No   Sexual activity: Not Currently    Birth control/protection: Surgical  Other Topics Concern   Not on file  Social History Narrative   Pt lives in [place] with her son.  Unemployed.  Right handed.     Education college   Social Determinants of Health   Financial Resource Strain: Medium Risk (09/06/2023)   Overall Financial Resource Strain (CARDIA)    Difficulty of Paying Living Expenses: Somewhat hard  Food Insecurity: No Food Insecurity (09/06/2023)   Hunger Vital Sign    Worried About Running Out of Food in the Last Year: Never true    Ran Out of Food in the Last Year: Never true  Transportation Needs: No Transportation Needs (09/06/2023)   PRAPARE - Administrator, Civil Service (Medical): No    Lack of Transportation (Non-Medical): No  Physical Activity: Insufficiently Active (09/06/2023)   Exercise Vital Sign    Days of Exercise per Week: 2 days    Minutes of Exercise per Session: 60 min  Stress: Stress Concern Present (09/06/2023)   [employer] of Occupational Health - Occupational Stress Questionnaire    Feeling of Stress : Rather much  Social Connections: Socially Isolated (09/06/2023)   Social Connection and Isolation Panel [NHANES]    Frequency of Communication with Friends and Family: More than three times a week    Frequency of Social Gatherings with Friends and Family: Once a week    Attends Religious Services: Never    Database administrator or Organizations: No    Attends Banker Meetings: Never    Marital Status: Divorced    Tobacco Counseling Counseling given: Not Answered Tobacco comments: Never smoke 01/27/22   Clinical Intake:     Pain : 0-10 Pain Score: 9  Pain Location: Ankle (has gout flare up) Pain Orientation: Right Pain Onset: In the past 7 days     BMI - recorded: 46.06 Nutritional  Status: BMI > 30  Obese Diabetes: Yes CBG done?: No Did pt. bring in CBG monitor from home?: No  How often  do you need to have someone help you when you read instructions, pamphlets, or other written materials from your doctor or pharmacy?: 1 - Never  Interpreter Needed?: No  Information entered by :: Nimo Verastegui, RMA   Activities of Daily Living    09/06/2023    8:22 AM 08/16/2023    7:51 AM  In your present state of health, do you have any difficulty performing the following activities:  Hearing? 0 0  Vision? 0 0  Difficulty concentrating or making decisions? 0 0  Walking or climbing stairs? 0   Dressing or bathing? 0   Doing errands, shopping? 0   Preparing Food and eating ? N   Using the Toilet? N   In the past six months, have you accidently leaked urine? N   Do you have problems with loss of bowel control? N   Managing your Medications? N   Managing your Finances? N   Housekeeping or managing your Housekeeping? N     Patient Care Team: [name], MD as PCP - General (Internal Medicine) Duke Salvia, MD as PCP - Electrophysiology (Cardiology) Manning Charity, OD as Referring Physician (Optometry)  Indicate any recent Medical Services you may have received from other than [facility] providers in the past year (date may be approximate).     Assessment:   This is a routine wellness examination for [place].  Hearing/Vision screen Hearing Screening - Comments:: Denies hearing difficulties   Vision Screening - Comments:: Denies vision issues   Goals Addressed   None   Depression Screen    09/06/2023    8:39 AM 08/24/2023   10:28 AM 04/19/2023   10:15 AM 11/10/2022    2:45 PM 05/07/2022    3:59 PM 04/13/2022    8:38 AM 03/18/2022   10:08 AM  PHQ 2/9 Scores  PHQ - 2 Score 3 0 1 0 0 5 6  PHQ- 9 Score 8 2 1  0 11 17 20     Fall Risk    08/24/2023   10:28 AM 05/07/2022    4:03 PM 03/18/2022   10:09 AM 03/16/2018    5:09 PM 03/30/2017   12:08 PM  Fall Risk    Falls in the past year? 0 0 0 No Yes  Number falls in past yr: 0 0   1  Injury with Fall? 0 0   No  Risk for fall due to : No Fall Risks No Fall Risks     Follow up Falls evaluation completed Falls evaluation completed   Education provided    MEDICARE RISK AT HOME: Medicare Risk at Home Any stairs in or around the home?: Yes If so, are there any without handrails?: Yes Home free of loose throw rugs in walkways, pet beds, electrical cords, etc?: Yes Adequate lighting in your home to reduce risk of falls?: Yes Life alert?: No Use of a cane, walker or w/c?: No Grab bars in the bathroom?: No Shower chair or bench in shower?: No Elevated toilet seat or a handicapped toilet?: No  TIMED UP AND GO:  Was the test performed?  No    Cognitive Function:        09/06/2023    8:35 AM 05/07/2022    4:05 PM  6CIT Screen  What Year? 0 points 0 points  What month? 0 points 0 points  What time? 0 points 0 points  Count back from 20 0 points 0 points  Months in reverse 0 points 0 points  Repeat  phrase 0 points 0 points  Total Score 0 points 0 points    Immunizations Immunization History  Administered Date(s) Administered   Fluad Quad(high Dose 65+) 08/13/2017, 07/19/2019, 07/02/2022   Fluad Trivalent(High Dose 65+) 07/21/2023   Influenza Whole 09/02/2009   Influenza, High Dose Seasonal PF 08/13/2017, 08/22/2018, 08/22/2018, 07/19/2019   Influenza,inj,Quad PF,6+ Mos 08/04/2013, 08/13/2014, 08/02/2015, 07/11/2020   PFIZER(Purple Top)SARS-COV-2 Vaccination 12/17/2019, 01/14/2020, 09/09/2020   PNEUMOCOCCAL CONJUGATE-20 03/18/2022   PPD Test 11/07/2013   Pneumococcal Polysaccharide-23 10/25/2009, 10/25/2009, 08/20/2016, 08/20/2016   Tdap 08/04/2013   Zoster Recombinant(Shingrix) 08/02/2020, 10/04/2020    TDAP status: Due, Education has been provided regarding the importance of this vaccine. Advised may receive this vaccine at local pharmacy or Health Dept. Aware to provide a copy of  the vaccination record if obtained from local pharmacy or Health Dept. Verbalized acceptance and understanding.  Flu Vaccine status: Up to date  Pneumococcal vaccine status: Up to date  Covid-19 vaccine status: Information provided on how to obtain vaccines.   Qualifies for Shingles Vaccine? Yes   Zostavax completed Yes   Shingrix Completed?: Yes  Screening Tests Health Maintenance  Topic Date Due   DEXA SCAN  Never done   OPHTHALMOLOGY EXAM  12/20/2022   DTaP/Tdap/Td (2 - Td or Tdap) 10/19/2023 (Originally 08/05/2023)   HEMOGLOBIN A1C  01/20/2024   Diabetic kidney evaluation - Urine ACR  05/23/2024   FOOT EXAM  05/23/2024   Diabetic kidney evaluation - eGFR measurement  08/05/2024   Medicare Annual Wellness (AWV)  09/05/2024   MAMMOGRAM  07/22/2025   Colonoscopy  07/24/2029   Pneumonia Vaccine 59+ Years old  Completed   INFLUENZA VACCINE  Completed   Hepatitis C Screening  Completed   Zoster Vaccines- Shingrix  Completed   HPV VACCINES  Aged Out   COVID-19 Vaccine  Discontinued    Health Maintenance  Health Maintenance Due  Topic Date Due   DEXA SCAN  Never done   OPHTHALMOLOGY EXAM  12/20/2022    Colorectal cancer screening: Referral to GI placed 08/19/2023. Pt aware the office will call re: appt.  Mammogram status: Completed 07/23/2023. Repeat every year  Bone Density status: Ordered 09/06/2023. Pt provided with contact info and advised to call to schedule appt.  Lung Cancer Screening: (Low Dose CT Chest recommended if Age 56-80 years, 20 pack-year currently smoking OR have quit w/in 15years.) does not qualify.   Lung Cancer Screening Referral: N/A  Additional Screening:  Hepatitis C Screening: does qualify; Completed 08/17/2014  Vision Screening: Recommended annual ophthalmology exams for early detection of glaucoma and other disorders of the eye. Is the patient up to date with their annual eye exam?  Yes  Who is the provider or what is the name of the  office in which the patient attends annual eye exams? Dr. Emily Filbert  If pt is not established with a provider, would they like to be referred to a provider to establish care? No .   Dental Screening: Recommended annual dental exams for proper oral hygiene  Diabetic Foot Exam: Diabetic Foot Exam: Completed 05/24/2023  Community Resource Referral / Chronic Care Management: CRR required this visit?  No   CCM required this visit?  No     Plan:     I have personally reviewed and noted the following in the patient's chart:   Medical and social history Use of alcohol, tobacco or illicit drugs  Current medications and supplements including opioid prescriptions. Patient is not currently taking opioid prescriptions. Functional  ability and status Nutritional status Physical activity Advanced directives List of other physicians Hospitalizations, surgeries, and ER visits in previous 12 months Vitals Screenings to include cognitive, depression, and falls Referrals and appointments  In addition, I have reviewed and discussed with patient certain preventive protocols, quality metrics, and best practice recommendations. A written personalized care plan for preventive services as well as general preventive health recommendations were provided to patient.     Yahir Tavano L Lether Tesch, CMA   09/21/2023   After Visit Summary: (MyChart) Due to this being a telephonic visit, the after visit summary with patients personalized plan was offered to patient via MyChart   Nurse Notes: Patient is due for a Tdap vaccine.  She is due for a DEXA and order has been placed today.  Patient stated that she just had her eye exam last week.  I will send a request of patient's record today.  Patient has some concerns about her A-Fib, she stated that she went to her office visit with Dr. Nelva Bush and he prescribed her Amiodarone 200 mg but patient stated that he told her not to take it until January, due to her having a colonoscopy  on Dec 18th.  Patient has concerns because she has been feeling jittery and states that her heart has been in A-fib.  I  advised her to speak with her cardiologist in regards to her concerns and also informed her that what she has stated would be in this note as well for Dr. Yetta Barre to be aware.

## 2023-09-06 NOTE — Patient Instructions (Signed)
Stephanie [name] , Thank you for taking time to come for your Medicare Wellness Visit. I appreciate your ongoing commitment to your health goals. Please review the following plan we discussed and let me know if I can assist you in the future.   Referrals/Orders/Follow-Ups/Clinician Recommendations: You are due for a Tetanus vaccine, which can be done at your local pharmacy.   Remember to call and get scheduled for a Bone Density screening.    You have an order for:  [x]   Bone Density     Please call for appointment:  [facility] [address] [place], [place] [address] [phone]  Make sure to wear two-piece clothing.  No lotions, powders, or deodorants the day of the appointment. Make sure to bring picture ID and insurance card.  Bring list of medications you are currently taking including any supplements.   Schedule your [facility] screening mammogram through MyChart!   Log into your MyChart account.  Go to 'Visit' (or 'Appointments' if on mobile App) --> Schedule an Appointment  Under 'Select a Reason for Visit' choose the Mammogram Screening option.  Complete the pre-visit questions and select the time and place that best fits your schedule.    This is a list of the screening recommended for you and due dates:  Health Maintenance  Topic Date Due   DEXA scan (bone density measurement)  Never done   Eye exam for diabetics  12/20/2022   DTaP/Tdap/Td vaccine (2 - Td or Tdap) 10/19/2023*   Hemoglobin A1C  01/20/2024   Yearly kidney health urinalysis for diabetes  05/23/2024   Complete foot exam   05/23/2024   Yearly kidney function blood test for diabetes  08/05/2024   Medicare Annual Wellness Visit  09/05/2024   Mammogram  07/22/2025   Colon Cancer Screening  07/24/2029   Pneumonia Vaccine  Completed   Flu Shot  Completed   Hepatitis C Screening  Completed   Zoster (Shingles) Vaccine  Completed   HPV Vaccine  Aged Out   COVID-19 Vaccine   Discontinued  *Topic was postponed. The date shown is not the original due date.    Advanced directives: (Copy Requested) Please bring a copy of your health care power of attorney and living will to the office to be added to your chart at your convenience.  Next Medicare Annual Wellness Visit scheduled for next year: Yes

## 2023-09-23 ENCOUNTER — Ambulatory Visit (HOSPITAL_COMMUNITY): Payer: 59 | Admitting: Internal Medicine

## 2023-09-24 ENCOUNTER — Encounter: Payer: Self-pay | Admitting: Gastroenterology

## 2023-09-29 ENCOUNTER — Encounter (HOSPITAL_COMMUNITY): Payer: Self-pay

## 2023-09-29 ENCOUNTER — Other Ambulatory Visit: Payer: Self-pay

## 2023-09-29 ENCOUNTER — Emergency Department (HOSPITAL_COMMUNITY): Payer: 59

## 2023-09-29 ENCOUNTER — Emergency Department (HOSPITAL_COMMUNITY)
Admission: EM | Admit: 2023-09-29 | Discharge: 2023-09-29 | Disposition: A | Discharge status: Home / Self Care | Payer: 59 | Attending: Emergency Medicine | Admitting: Emergency Medicine

## 2023-09-29 DIAGNOSIS — J45909 Unspecified asthma, uncomplicated: Secondary | ICD-10-CM | POA: Insufficient documentation

## 2023-09-29 DIAGNOSIS — J069 Acute upper respiratory infection, unspecified: Secondary | ICD-10-CM | POA: Diagnosis not present

## 2023-09-29 DIAGNOSIS — Z7951 Long term (current) use of inhaled steroids: Secondary | ICD-10-CM | POA: Diagnosis not present

## 2023-09-29 DIAGNOSIS — R079 Chest pain, unspecified: Secondary | ICD-10-CM | POA: Diagnosis not present

## 2023-09-29 DIAGNOSIS — R062 Wheezing: Secondary | ICD-10-CM | POA: Diagnosis present

## 2023-09-29 DIAGNOSIS — Z7901 Long term (current) use of anticoagulants: Secondary | ICD-10-CM | POA: Insufficient documentation

## 2023-09-29 DIAGNOSIS — I10 Essential (primary) hypertension: Secondary | ICD-10-CM | POA: Diagnosis not present

## 2023-09-29 DIAGNOSIS — Z20822 Contact with and (suspected) exposure to covid-19: Secondary | ICD-10-CM | POA: Diagnosis not present

## 2023-09-29 DIAGNOSIS — Z79899 Other long term (current) drug therapy: Secondary | ICD-10-CM | POA: Insufficient documentation

## 2023-09-29 DIAGNOSIS — R008 Other abnormalities of heart beat: Secondary | ICD-10-CM | POA: Insufficient documentation

## 2023-09-29 DIAGNOSIS — R0602 Shortness of breath: Secondary | ICD-10-CM | POA: Diagnosis not present

## 2023-09-29 DIAGNOSIS — Z7952 Long term (current) use of systemic steroids: Secondary | ICD-10-CM | POA: Diagnosis not present

## 2023-09-29 LAB — BASIC METABOLIC PANEL
Anion gap: 6 (ref 5–15)
BUN: 17 mg/dL (ref 8–23)
CO2: 25 mmol/L (ref 22–32)
Calcium: 9 mg/dL (ref 8.9–10.3)
Chloride: 105 mmol/L (ref 98–111)
Creatinine, Ser: 1.07 mg/dL — ABNORMAL HIGH (ref 0.44–1.00)
GFR, Estimated: 56 mL/min — ABNORMAL LOW (ref >60)
Glucose, Bld: 177 mg/dL — ABNORMAL HIGH (ref 70–99)
Potassium: 3.6 mmol/L (ref 3.5–5.1)
Sodium: 136 mmol/L (ref 135–145)

## 2023-09-29 LAB — CBC
HCT: 36 % (ref 36.0–46.0)
Hemoglobin: 11.4 g/dL — ABNORMAL LOW (ref 12.0–15.0)
MCH: 30.4 pg (ref 26.0–34.0)
MCHC: 31.7 g/dL (ref 30.0–36.0)
MCV: 96 fL (ref 80.0–100.0)
Platelets: 200 10*3/uL (ref 150–400)
RBC: 3.75 MIL/uL — ABNORMAL LOW (ref 3.87–5.11)
RDW: 14.5 % (ref 11.5–15.5)
WBC: 4.6 10*3/uL (ref 4.0–10.5)
nRBC: 0 % (ref 0.0–0.2)

## 2023-09-29 LAB — RESP PANEL BY RT-PCR (RSV, FLU A&B, COVID)  RVPGX2
Influenza A by PCR: NEGATIVE
Influenza B by PCR: NEGATIVE
Resp Syncytial Virus by PCR: NEGATIVE
SARS Coronavirus 2 by RT PCR: NEGATIVE

## 2023-09-29 LAB — TROPONIN I (HIGH SENSITIVITY): Troponin I (High Sensitivity): 7 ng/L (ref <18)

## 2023-09-29 MED ORDER — IPRATROPIUM-ALBUTEROL 0.5-2.5 (3) MG/3ML IN SOLN
3.0000 mL | Freq: Once | RESPIRATORY_TRACT | Status: AC
  Administered 2023-09-29: 3 mL via RESPIRATORY_TRACT
  Filled 2023-09-29: qty 3

## 2023-09-29 MED ORDER — ALBUTEROL SULFATE HFA 108 (90 BASE) MCG/ACT IN AERS
2.0000 | INHALATION_SPRAY | Freq: Once | RESPIRATORY_TRACT | Status: AC
  Administered 2023-09-29: 2 via RESPIRATORY_TRACT
  Filled 2023-09-29: qty 6.7

## 2023-09-29 MED ORDER — PREDNISONE 10 MG PO TABS
40.0000 mg | ORAL_TABLET | Freq: Every day | ORAL | 0 refills | Status: AC
Start: 2023-09-30 — End: 2023-10-05

## 2023-09-29 NOTE — Discharge Instructions (Addendum)
Please be sure to take all of your normal medications at home including your Eliquis for A-fib.  Do not miss any doses of these medicines.  You likely have a viral illness which can take several more days to resolve.  You were wheezing on exam.  We gave you albuterol in the ER, and I prescribed you steroids which she can begin taking if your symptoms or not improving over the next 2 days.

## 2023-09-29 NOTE — ED Provider Notes (Signed)
[place] EMERGENCY DEPARTMENT AT [facility] Provider Note   CSN: [id number] Arrival date & time: 09/29/23  1827     History  Chief Complaint  Patient presents with   Chest Pain   Cough    Stephanie [name] is a 71 y.o. female with reported history of asthma, A-fib on Eliquis, presenting from home with concern for chest tightness and coughing.  Patient reports she has been coughing for about 2 weeks, felt that she got a "head cold" when her heat broke down in her apartment for the past week, although it is now repaired.  She has persistent coughing, worse at night, and chest tightness with coughing.  She does not smoke or vape.  Her son who visit her was also coughing this week  HPI     Home Medications Prior to Admission medications   Medication Sig Start Date End Date Taking? Authorizing Provider  predniSONE (DELTASONE) 10 MG tablet Take 4 tablets (40 mg total) by mouth daily for 5 doses. 09/30/23 10/05/23 Yes Akiva Josey, Kermit Balo, MD  Accu-Chek FastClix Lancets MISC  09/26/18   [provider]  allopurinol (ZYLOPRIM) 100 MG tablet TAKE 1/2 TABLET(50 MG) BY MOUTH TWICE DAILY 08/27/22   Rodolph Bong, MD  ALPRAZolam Prudy Feeler) 0.5 MG tablet Take 1 tablet (0.5 mg total) by mouth 3 (three) times daily as needed for anxiety. Patient taking differently: Take 0.5 mg by mouth at bedtime as needed for anxiety or sleep. 04/19/23   [name], MD  amiodarone (PACERONE) 200 MG tablet Take 1 tablet (200 mg total) by mouth 2 (two) times daily for 30 days, THEN 1 tablet (200 mg total) daily. Patient not taking: Reported on 08/24/2023 08/23/23 09/21/24  Eustace Pen, PA-C  apixaban (ELIQUIS) 5 MG TABS tablet Take 1 tablet (5 mg total) by mouth 2 (two) times daily. 05/03/23   Lutgen, Megan E, RPH-CPP  Blood Pressure KIT 1 kit by Does not apply route daily. Check blood pressure once a day Patient not taking: Reported on 08/24/2023 03/11/21   Duke Salvia, MD  colchicine 0.6 MG  tablet Take 0.6 mg by mouth daily as needed (gout flares). 12/25/21   [provider]  Finerenone (KERENDIA) 20 MG TABS Take 1 tablet (20 mg total) by mouth daily. 07/22/23   [name], MD  furosemide (LASIX) 40 MG tablet Take 1 tablet (40 mg total) by mouth daily. Patient taking differently: Take 40 mg by mouth as needed. 01/11/23   [name], MD  JARDIANCE 25 MG TABS tablet TAKE 1 TABLET(25 MG) BY MOUTH DAILY 03/12/23   [name], MD  metoprolol succinate (TOPROL-XL) 25 MG 24 hr tablet TAKE 1 TABLET(25 MG) BY MOUTH DAILY Patient taking differently: Take 25 mg by mouth daily in the afternoon. 04/27/23   Duke Salvia, MD  Na Sulfate-K Sulfate-Mg Sulf (SUPREP BOWEL PREP KIT) 17.5-3.13-1.6 GM/177ML SOLN Take 1 kit by mouth as directed. For colonoscopy prep Patient not taking: Reported on 08/24/2023 08/19/23   Unk Lightning, PA  neomycin-colistin-hydrocortisone-thonzonium (CORTISPORIN-TC) 3.12-19-08-0.5 MG/ML OTIC suspension Place 3 drops into the left ear 3 (three) times daily. 08/24/23   [name], MD  potassium chloride SA (KLOR-CON M15) 15 MEQ tablet Take 1 tablet (15 mEq total) by mouth 3 (three) times daily. Patient not taking: Reported on 08/23/2023 02/09/23   [name], MD  sacubitril-valsartan (ENTRESTO) 24-26 MG TAKE 1 TABLET BY MOUTH TWICE DAILY 06/07/23   Sherryl Manges  C, MD  simvastatin (ZOCOR) 10 MG tablet Take 1 tablet (10 mg total) by mouth daily. Patient taking differently: Take 10 mg by mouth at bedtime. 05/24/23   [name], MD  terbinafine (LAMISIL) 1 % cream Apply 1 Application topically 2 (two) times daily as needed (skin irritation/itching.).    [provider]  tirzepatide Greggory Keen) 5 MG/0.5ML Pen Inject 5 mg into the skin once a week. 08/24/23   [name], MD      Allergies    Amiodarone, Aspirin, Methimazole, Propoxyphene, Trintellix [vortioxetine], Fentanyl, Albuterol, Lipitor [atorvastatin], and Livalo [pitavastatin]     Review of Systems   Review of Systems  Physical Exam Updated Vital Signs BP (!) 166/111 (BP Location: Right Arm)   Pulse 98   Temp 98 F (36.7 C) (Oral)   Resp 18   Ht 5\' 3"  (1.6 m)   Wt 117.5 kg   SpO2 97%   BMI 45.88 kg/m  Physical Exam Constitutional:      General: She is not in acute distress.    Appearance: She is obese.  HENT:     Head: Normocephalic and atraumatic.  Eyes:     Conjunctiva/sclera: Conjunctivae normal.     Pupils: Pupils are equal, round, and reactive to light.  Cardiovascular:     Rate and Rhythm: Normal rate. Rhythm irregular.  Pulmonary:     Effort: Pulmonary effort is normal. No respiratory distress.     Comments: Expiratory wheezing notable in the right lung fields Abdominal:     General: There is no distension.     Tenderness: There is no abdominal tenderness.  Skin:    General: Skin is warm and dry.  Neurological:     General: No focal deficit present.     Mental Status: She is alert. Mental status is at baseline.  Psychiatric:        Mood and Affect: Mood normal.        Behavior: Behavior normal.     ED Results / Procedures / Treatments   Labs (all labs ordered are listed, but only abnormal results are displayed) Labs Reviewed  BASIC METABOLIC PANEL - Abnormal; Notable for the following components:      Result Value   Glucose, Bld 177 (*)    Creatinine, Ser 1.07 (*)    GFR, Estimated 56 (*)    All other components within normal limits  CBC - Abnormal; Notable for the following components:   RBC 3.75 (*)    Hemoglobin 11.4 (*)    All other components within normal limits  RESP PANEL BY RT-PCR (RSV, FLU A&B, COVID)  [id number]  TROPONIN I (HIGH SENSITIVITY)    EKG EKG Interpretation Date/Time:  Wednesday September 29 2023 18:41:32 EST Ventricular Rate:  93 PR Interval:    QRS Duration:  105 QT Interval:  402 QTC Calculation: 500 R Axis:   -15  Text Interpretation: Atrial fibrillation Low voltage, precordial leads Left  ventricular hypertrophy Borderline T abnormalities, lateral leads Borderline prolonged QT interval Confirmed by Alvester Chou [phone]) on 09/29/2023 8:24:28 PM  Radiology DG Chest 2 View  Result Date: 09/29/2023 CLINICAL DATA:  Chest pain, shortness of breath EXAM: CHEST - 2 VIEW COMPARISON:  11/12/2020 FINDINGS: Lungs are clear.  No pleural effusion or pneumothorax. The heart is normal in size. Mild degenerative changes of the visualized thoracolumbar spine. IMPRESSION: Normal chest radiographs. Electronically Signed   By: Charline Bills M.D.   On: 09/29/2023 20:01    Procedures Procedures  Medications Ordered in ED Medications  ipratropium-albuterol (DUONEB) 0.5-2.5 (3) MG/3ML nebulizer solution 3 mL (has no administration in time range)  albuterol (VENTOLIN HFA) 108 (90 Base) MCG/ACT inhaler 2 puff (has no administration in time range)    ED Course/ Medical Decision Making/ A&P                                 Medical Decision Making Amount and/or Complexity of Data Reviewed Labs: ordered. Radiology: ordered.  Risk Prescription drug management.   Patient is presented to ED with coughing, head congestion ongoing for 2 weeks, also reporting chest tightness with coughing.  Differential include viral illness vs bacterial PNA vs other  I reviewed the patient's labs and imaging, with no emergent findings.  Specifically no evidence of bacterial pneumonia on chest x-ray.  No leukocytosis.  Troponin is negative and have a low suspicion for ACS or acute PE.  No indication for CT angiogram imaging.  No indication for antibiotics.  We will perform a COVID and flu test and she can follow-up on the results at home.  She was also given breathing treatments here in the ED for her wheezing, some suspected reactive airway disease.  She is not hypoxic, she is stable to go home at this time.          Final Clinical Impression(s) / ED Diagnoses Final diagnoses:  Upper respiratory  tract infection, unspecified type  Wheezing  Hypertension, unspecified type    Rx / DC Orders ED Discharge Orders          Ordered    predniSONE (DELTASONE) 10 MG tablet  Daily        09/29/23 2051              Terald Sleeper, MD 09/29/23 2053

## 2023-09-29 NOTE — ED Triage Notes (Signed)
Cough for 2 weeks, worsened 2 days ago. Chest pain for 2 days with intermittent SOB. Pt states she is coughing up yellow and white phlegm

## 2023-10-04 ENCOUNTER — Encounter: Payer: Self-pay | Admitting: Certified Registered Nurse Anesthetist

## 2023-10-05 ENCOUNTER — Telehealth: Payer: Self-pay | Admitting: *Deleted

## 2023-10-05 NOTE — Progress Notes (Signed)
Complex Care Management Note   10/05/2023 Name: Devida Foti MRN: [id number] DOB: 03/07/1951  Averyrose Mild is a 72 y.o. year old female who sees [name], MD for primary care. I reached out to [name] by phone today to offer complex care management services.  Ms. Rabelo was given information about Complex Care Management services today including:   The Complex Care Management services include support from the care team which includes your Nurse Coordinator, Clinical Social Worker, or Pharmacist.  The Complex Care Management team is here to help remove barriers to the health concerns and goals most important to you. Complex Care Management services are voluntary, and the patient may decline or stop services at any time by request to their care team member.   Complex Care Management Consent Status: Patient agreed to services and verbal consent obtained.   Follow up plan:  Telephone appointment with complex care management team member scheduled for:  10/08/2023  Encounter Outcome:  Patient Scheduled   Burman Nieves, [facility] Care Coordination Care Guide Direct Dial: [phone]

## 2023-10-06 ENCOUNTER — Telehealth: Payer: Self-pay | Admitting: *Deleted

## 2023-10-06 ENCOUNTER — Ambulatory Visit: Payer: 59 | Admitting: Gastroenterology

## 2023-10-06 ENCOUNTER — Emergency Department (HOSPITAL_COMMUNITY): Payer: 59

## 2023-10-06 ENCOUNTER — Encounter: Payer: Self-pay | Admitting: Gastroenterology

## 2023-10-06 ENCOUNTER — Emergency Department (HOSPITAL_COMMUNITY)
Admission: EM | Admit: 2023-10-06 | Discharge: 2023-10-06 | Disposition: A | Discharge status: Home / Self Care | Payer: 59 | Attending: Emergency Medicine | Admitting: Emergency Medicine

## 2023-10-06 VITALS — BP 130/83 | HR 90 | Temp 97.2°F | Resp 16 | Ht 63.0 in | Wt 251.4 lb

## 2023-10-06 DIAGNOSIS — K56609 Unspecified intestinal obstruction, unspecified as to partial versus complete obstruction: Secondary | ICD-10-CM

## 2023-10-06 DIAGNOSIS — I4891 Unspecified atrial fibrillation: Secondary | ICD-10-CM | POA: Diagnosis not present

## 2023-10-06 DIAGNOSIS — I959 Hypotension, unspecified: Secondary | ICD-10-CM | POA: Diagnosis not present

## 2023-10-06 DIAGNOSIS — K648 Other hemorrhoids: Secondary | ICD-10-CM

## 2023-10-06 DIAGNOSIS — E039 Hypothyroidism, unspecified: Secondary | ICD-10-CM | POA: Diagnosis not present

## 2023-10-06 DIAGNOSIS — Z1211 Encounter for screening for malignant neoplasm of colon: Secondary | ICD-10-CM | POA: Diagnosis not present

## 2023-10-06 DIAGNOSIS — Z7901 Long term (current) use of anticoagulants: Secondary | ICD-10-CM | POA: Insufficient documentation

## 2023-10-06 DIAGNOSIS — R0689 Other abnormalities of breathing: Secondary | ICD-10-CM | POA: Diagnosis not present

## 2023-10-06 DIAGNOSIS — I4811 Longstanding persistent atrial fibrillation: Secondary | ICD-10-CM | POA: Diagnosis not present

## 2023-10-06 DIAGNOSIS — Z860101 Personal history of adenomatous and serrated colon polyps: Secondary | ICD-10-CM

## 2023-10-06 DIAGNOSIS — Z8719 Personal history of other diseases of the digestive system: Secondary | ICD-10-CM

## 2023-10-06 DIAGNOSIS — I499 Cardiac arrhythmia, unspecified: Secondary | ICD-10-CM | POA: Diagnosis not present

## 2023-10-06 DIAGNOSIS — M79662 Pain in left lower leg: Secondary | ICD-10-CM | POA: Diagnosis not present

## 2023-10-06 DIAGNOSIS — K56699 Other intestinal obstruction unspecified as to partial versus complete obstruction: Secondary | ICD-10-CM

## 2023-10-06 HISTORY — DX: Unspecified atrial fibrillation: I48.91

## 2023-10-06 LAB — CBC
HCT: 39.2 % (ref 36.0–46.0)
Hemoglobin: 12.4 g/dL (ref 12.0–15.0)
MCH: 30.1 pg (ref 26.0–34.0)
MCHC: 31.6 g/dL (ref 30.0–36.0)
MCV: 95.1 fL (ref 80.0–100.0)
Platelets: 204 10*3/uL (ref 150–400)
RBC: 4.12 MIL/uL (ref 3.87–5.11)
RDW: 14.6 % (ref 11.5–15.5)
WBC: 10.1 10*3/uL (ref 4.0–10.5)
nRBC: 0 % (ref 0.0–0.2)

## 2023-10-06 LAB — BASIC METABOLIC PANEL
Anion gap: 12 (ref 5–15)
BUN: 27 mg/dL — ABNORMAL HIGH (ref 8–23)
CO2: 18 mmol/L — ABNORMAL LOW (ref 22–32)
Calcium: 9.1 mg/dL (ref 8.9–10.3)
Chloride: 109 mmol/L (ref 98–111)
Creatinine, Ser: 1.22 mg/dL — ABNORMAL HIGH (ref 0.44–1.00)
GFR, Estimated: 47 mL/min — ABNORMAL LOW (ref >60)
Glucose, Bld: 130 mg/dL — ABNORMAL HIGH (ref 70–99)
Potassium: 4.4 mmol/L (ref 3.5–5.1)
Sodium: 139 mmol/L (ref 135–145)

## 2023-10-06 LAB — MAGNESIUM: Magnesium: 2.3 mg/dL (ref 1.7–2.4)

## 2023-10-06 MED ORDER — OXYCODONE HCL 5 MG PO TABS: 5.0000 mg | ORAL_TABLET | Freq: Three times a day (TID) | ORAL | 0 refills | Status: DC | PRN

## 2023-10-06 MED ORDER — KETOROLAC TROMETHAMINE 30 MG/ML IJ SOLN
30.0000 mg | Freq: Once | INTRAMUSCULAR | Status: AC
  Administered 2023-10-06: 30 mg via INTRAVENOUS
  Filled 2023-10-06: qty 1

## 2023-10-06 MED ORDER — METOPROLOL TARTRATE 25 MG PO TABS
25.0000 mg | ORAL_TABLET | Freq: Once | ORAL | Status: AC
  Administered 2023-10-06: 25 mg via ORAL
  Filled 2023-10-06: qty 1

## 2023-10-06 MED ORDER — ALPRAZOLAM 0.25 MG PO TABS
1.0000 mg | ORAL_TABLET | Freq: Once | ORAL | Status: AC
  Administered 2023-10-06: 1 mg via ORAL
  Filled 2023-10-06: qty 4

## 2023-10-06 MED ORDER — SODIUM CHLORIDE 0.9 % IV SOLN: 500.0000 mL | INTRAVENOUS | Status: DC

## 2023-10-06 NOTE — Telephone Encounter (Signed)
-----   Message from Benancio Deeds sent at 10/06/2023  3:23 PM EST ----- Regarding: CT scan [name] can you help order this patient a CT Scan abdomen / pelvis for history of diverticulitis, sigmoid stricture? Thanks

## 2023-10-06 NOTE — Progress Notes (Signed)
Patient in recovery area post procedure has HR fluctuating between 110 and 150 max. BP stable but remains in low 100s systolic. She is weak. Perhaps prep has led to this / dehydration. Have previously given a dose of esmolol, anesthesia concerned about additional dosing lowering her blood pressure. She denies chest pains. Have discussed options with the patient and son, recommend she go via EMS to the ED for further management of the AF with RVR, they agree.

## 2023-10-06 NOTE — Telephone Encounter (Signed)
Patient has been scheduled for CT abdomen/pelvis at [facility] on 10/14/23 at 1230 pm, 1030 am arrival (to drink oral contrast). I will reach out to patient with this information tomorrow as she was sedated today for procedure.

## 2023-10-06 NOTE — ED Provider Notes (Signed)
[place] EMERGENCY DEPARTMENT AT [facility] Provider Note   CSN: [id number] Arrival date & time: 10/06/23  1319     History  No chief complaint on file.   Stephanie [name] is a 72 y.o. female with a history of persistent A-fib, on Eliquis, presented to ED from GI suite with concern for A-fib with RVR.  The patient underwent colonoscopy today.  Following her colonoscopy she was found to be in A-fib with RVR.  She was given esmolol with no significant improvement in heart rate, and then brought to the ED for further evaluation.  The patient reports that she slept very poorly last night, due to the GI prep that was given to her, and she was needing to use the bathroom constantly.  She is also completing a course of prednisone prescribed for asthma or URI type symptoms last week during her visit to the ED, which time I evaluated her.  She had 1 more day of this medication as well.  He says typically her heart rate is in A-fib but controlled under 100 bpm.  She takes metoprolol 25 mg XL once daily, but is not on another antiarrhythmic medicines.  She has failed amiodarone in the past, but states her cardiologist wants to reattempt this in January.  She has also been on Multaq but discontinued for adverse reactions in the past.  She does not complain of chest pain or palpitations.  She does however complaining of soreness in her left lower leg.  She does not said this began within the past 24 hours when she took the GI medications.  She says there is tenderness and a cramping pain behind her left knee.  HPI     Home Medications Prior to Admission medications   Medication Sig Start Date End Date Taking? Authorizing Provider  oxyCODONE (ROXICODONE) 5 MG immediate release tablet Take 1 tablet (5 mg total) by mouth every 8 (eight) hours as needed for up to 10 doses for severe pain (pain score 7-10). 10/06/23  Yes Terald Sleeper, MD  Accu-Chek FastClix Lancets MISC  09/26/18   [provider]  allopurinol (ZYLOPRIM) 100 MG tablet TAKE 1/2 TABLET(50 MG) BY MOUTH TWICE DAILY 08/27/22   Rodolph Bong, MD  ALPRAZolam Prudy Feeler) 0.5 MG tablet Take 1 tablet (0.5 mg total) by mouth 3 (three) times daily as needed for anxiety. Patient taking differently: Take 0.5 mg by mouth at bedtime as needed for anxiety or sleep. 04/19/23   [name], MD  amiodarone (PACERONE) 200 MG tablet Take 1 tablet (200 mg total) by mouth 2 (two) times daily for 30 days, THEN 1 tablet (200 mg total) daily. Patient not taking: Reported on 10/06/2023 08/23/23 09/21/24  Eustace Pen, PA-C  apixaban (ELIQUIS) 5 MG TABS tablet Take 1 tablet (5 mg total) by mouth 2 (two) times daily. 05/03/23   Lutgen, Megan E, RPH-CPP  Blood Pressure KIT 1 kit by Does not apply route daily. Check blood pressure once a day Patient not taking: Reported on 08/24/2023 03/11/21   Duke Salvia, MD  colchicine 0.6 MG tablet Take 0.6 mg by mouth daily as needed (gout flares). 12/25/21   [provider]  dronedarone (MULTAQ) 400 MG tablet 1 tablet with meals Oral Twice a day    [provider]  Finerenone (KERENDIA) 20 MG TABS Take 1 tablet (20 mg total) by mouth daily. 07/22/23   [name], MD  furosemide (LASIX) 40 MG tablet Take 1  tablet (40 mg total) by mouth daily. Patient not taking: Reported on 10/06/2023 01/11/23   [name], MD  JARDIANCE 25 MG TABS tablet TAKE 1 TABLET(25 MG) BY MOUTH DAILY 03/12/23   [name], MD  metoprolol succinate (TOPROL-XL) 25 MG 24 hr tablet TAKE 1 TABLET(25 MG) BY MOUTH DAILY Patient taking differently: Take 25 mg by mouth daily in the afternoon. 04/27/23   Duke Salvia, MD  neomycin-colistin-hydrocortisone-thonzonium (CORTISPORIN-TC) 3.12-19-08-0.5 MG/ML OTIC suspension Place 3 drops into the left ear 3 (three) times daily. 08/24/23   [name], MD  potassium chloride SA (KLOR-CON M15) 15 MEQ tablet Take 1 tablet (15 mEq total) by mouth 3 (three) times  daily. Patient not taking: Reported on 08/23/2023 02/09/23   [name], MD  sacubitril-valsartan (ENTRESTO) 24-26 MG TAKE 1 TABLET BY MOUTH TWICE DAILY 06/07/23   Duke Salvia, MD  simvastatin (ZOCOR) 10 MG tablet Take 1 tablet (10 mg total) by mouth daily. Patient taking differently: Take 10 mg by mouth at bedtime. 05/24/23   [name], MD  terbinafine (LAMISIL) 1 % cream Apply 1 Application topically 2 (two) times daily as needed (skin irritation/itching.).    [provider]  tirzepatide Greggory Keen) 5 MG/0.5ML Pen Inject 5 mg into the skin once a week. 08/24/23   [name], MD      Allergies    Amiodarone, Aspirin, Methimazole, Propoxyphene, Trintellix [vortioxetine], Fentanyl, Albuterol, Lipitor [atorvastatin], and Livalo [pitavastatin]    Review of Systems   Review of Systems  Physical Exam Updated Vital Signs BP (!) 149/75   Pulse (!) 53   Temp 98 F (36.7 C) (Temporal)   Resp 20   SpO2 100%  Physical Exam Constitutional:      General: She is not in acute distress. HENT:     Head: Normocephalic and atraumatic.  Eyes:     Conjunctiva/sclera: Conjunctivae normal.     Pupils: Pupils are equal, round, and reactive to light.  Cardiovascular:     Rate and Rhythm: Tachycardia present. Rhythm irregular.  Pulmonary:     Effort: Pulmonary effort is normal. No respiratory distress.  Abdominal:     General: There is no distension.     Tenderness: There is no abdominal tenderness.  Musculoskeletal:     Comments: Tenderness to palpation to the left posterior fossa and left posterior and medial aspect of the upper leg, without palpable cord, warmth, or swelling  Skin:    General: Skin is warm and dry.  Neurological:     General: No focal deficit present.     Mental Status: She is alert. Mental status is at baseline.  Psychiatric:        Mood and Affect: Mood normal.        Behavior: Behavior normal.     ED Results / Procedures / Treatments    Labs (all labs ordered are listed, but only abnormal results are displayed) Labs Reviewed  BASIC METABOLIC PANEL - Abnormal; Notable for the following components:      Result Value   CO2 18 (*)    Glucose, Bld 130 (*)    BUN 27 (*)    Creatinine, Ser 1.22 (*)    GFR, Estimated 47 (*)    All other components within normal limits  CBC  MAGNESIUM    EKG EKG Interpretation Date/Time:  Wednesday October 06 2023 13:19:36 EST Ventricular Rate:  129 PR Interval:    QRS Duration:  93 QT Interval:  354 QTC Calculation: 479 R Axis:   4  Text Interpretation: Atrial fibrillation Paired ventricular premature complexes Nonspecific T abnormalities, lateral leads Confirmed by Alvester Chou [phone]) on 10/06/2023 1:23:44 PM  Radiology VAS [place] LOWER EXTREMITY VENOUS (DVT) (7a-7p) Result Date: 10/06/2023  Lower Venous DVT Study Patient Name:  Stephanie [name]  Date of Exam:   10/06/2023 Medical Rec #: [id number]        Accession #:    [id number] Date of Birth: October 27, 1950        Patient Gender: F Patient Age:   55 years Exam Location:  [facility] Procedure:      VAS [place] LOWER EXTREMITY VENOUS (DVT) Referring Phys: Sricharan Lacomb --------------------------------------------------------------------------------  Indications: Pain.  Risk Factors: None identified. Limitations: Body habitus and poor ultrasound/tissue interface. Comparison Study: No prior studies. Performing Technologist: [name] RVT  Examination Guidelines: A complete evaluation includes B-mode imaging, spectral Doppler, color Doppler, and power Doppler as needed of all accessible portions of each vessel. Bilateral testing is considered an integral part of a complete examination. Limited examinations for reoccurring indications may be performed as noted. The reflux portion of the exam is performed with the patient in reverse Trendelenburg.  +-----+---------------+---------+-----------+----------+--------------+  RIGHTCompressibilityPhasicitySpontaneityPropertiesThrombus Aging +-----+---------------+---------+-----------+----------+--------------+ CFV  Full           Yes      Yes                                 +-----+---------------+---------+-----------+----------+--------------+   +---------+---------------+---------+-----------+----------+-------------------+ LEFT     CompressibilityPhasicitySpontaneityPropertiesThrombus Aging      +---------+---------------+---------+-----------+----------+-------------------+ CFV      Full           Yes      Yes                                      +---------+---------------+---------+-----------+----------+-------------------+ SFJ      Full                                                             +---------+---------------+---------+-----------+----------+-------------------+ FV Prox  Full                                                             +---------+---------------+---------+-----------+----------+-------------------+ FV Mid   Full                                                             +---------+---------------+---------+-----------+----------+-------------------+ FV DistalFull                                                             +---------+---------------+---------+-----------+----------+-------------------+  PFV      Full                                                             +---------+---------------+---------+-----------+----------+-------------------+ POP      Full           Yes      Yes                                      +---------+---------------+---------+-----------+----------+-------------------+ PTV      Full                                                             +---------+---------------+---------+-----------+----------+-------------------+ PERO                                                  Not well visualized  +---------+---------------+---------+-----------+----------+-------------------+    Summary: RIGHT: - No evidence of common femoral vein obstruction.   LEFT: - There is no evidence of deep vein thrombosis in the lower extremity. However, portions of this examination were limited- see technologist comments above.  - No cystic structure found in the popliteal fossa.  *See table(s) above for measurements and observations.    Preliminary     Procedures .Critical Care  Performed by: Terald Sleeper, MD Authorized by: Terald Sleeper, MD   Critical care provider statement:    Critical care time (minutes):  30   Critical care time was exclusive of:  Separately billable procedures and treating other patients   Critical care was necessary to treat or prevent imminent or life-threatening deterioration of the following conditions:  Circulatory failure   Critical care was time spent personally by me on the following activities:  Ordering and performing treatments and interventions, ordering and review of laboratory studies, ordering and review of radiographic studies, pulse oximetry, review of old charts, examination of patient and evaluation of patient's response to treatment Comments:     Rate control medication for A-fib with RVR     Medications Ordered in ED Medications  ALPRAZolam (XANAX) tablet 1 mg (1 mg Oral Given 10/06/23 1416)  ketorolac (TORADOL) 30 MG/ML injection 30 mg (30 mg Intravenous Given 10/06/23 1416)  metoprolol tartrate (LOPRESSOR) tablet 25 mg (25 mg Oral Given 10/06/23 1416)    ED Course/ Medical Decision Making/ A&P Clinical Course as of 10/06/23 1538  Wed Oct 06, 2023  1514 Pt signed out to Dr Ricke Hey pending completion of DVT ultrasound - reassessment of heart rate, if improved below 100 I anticipate she could be discharged back home as this is her normal baseline [MT]  1536 Patient reassessed and comfortable with heart rate 90's in A Fib, which is back to her baseline  level.  She is stable for discharge at this time.  Her son will be taking her home.  Because she is anticoagulated cannot use NSAIDs long-term,  we have discussed and agreed to a short course of opioid narcotic medicine, 8 to 10 mg of oxycodone for her persistent left knee pain.  I suspect this may be related to gout or osteoarthritis of the knee, flared up after her procedure, but it is causing her some moderate level of discomfort [MT]  1536 No acute DVT of the lower extremity per my discussion with the vascular technician performing her scan. [MT]    Clinical Course User Index [MT] Juliany Daughety, Kermit Balo, MD                                 Medical Decision Making Amount and/or Complexity of Data Reviewed Labs: ordered.  Risk Prescription drug management.   This patient presents to the ED with concern for A-fib with RVR, left lower extremity pain. This involves an extensive number of treatment options, and is a complaint that carries with it a high risk of complications and morbidity.    Her RVR is likely related to anesthesia medications given for colonoscopy, as well as her GI prep and recent steroid prescription at home.  Her heart rate is ready improved to closer to 100 bpm, now 100-110 bpm.  I suspect will continue to improve as her anesthesia leashes out of her system and she returns back to baseline.  She is requesting her home Xanax which she typically takes for anxiety, I will do so as well as an additional dose of metoprolol, which I think would further help with her heart rate.  If this returns normal I do not believe she would require hospitalization for this issue.  Her left leg pain may be multifactorial as well.  This may be related to electrolyte derangement or potential clot or thrombosis.  She is compliant with Eliquis for her A-fib, but I do think a DVT study would be reasonable.  I do not see evidence of infection.  No report or indication for x-ray imaging or fracture.  Patient  is not having chest pain to raise concern for PE.  Co-morbidities that complicate the patient evaluation: History of persistent A-fib with RVR  External records from outside source obtained and reviewed including GI notes from today  I ordered and personally interpreted labs.  The pertinent results include: No emergent findings  The patient was maintained on a cardiac monitor.  I personally viewed and interpreted the cardiac monitored which showed an underlying rhythm of: A-fib with RVR and subsequent rate controlled A-fib  Per my interpretation the patient's ECG shows A-fib with RVR  I ordered medication including xanax, metoprolol, Toradol  I have reviewed the patients home medicines and have made adjustments as needed  Test Considered: Low suspicion for acute PE.  No indication for CT angio at this time; doubt fracture or infection of the lower extremity   After the interventions noted above, I reevaluated the patient and found that they have: improved   Dispostion:  After consideration of the diagnostic results and the patients response to treatment, I feel that the patent would benefit from close outpatient follow-up         Final Clinical Impression(s) / ED Diagnoses Final diagnoses:  Longstanding persistent atrial fibrillation (HCC)    Rx / DC Orders ED Discharge Orders          Ordered    oxyCODONE (ROXICODONE) 5 MG immediate release tablet  Every 8 hours PRN  10/06/23 1535              Terald Sleeper, MD 10/06/23 1538

## 2023-10-06 NOTE — Progress Notes (Signed)
Pt remains in rapid afib.  Per Dr. Adela Lank will transfer to hospital for further treatment. Waiting on EMS

## 2023-10-06 NOTE — Progress Notes (Signed)
[facility] History and Physical   Primary Care Physician:  [name], MD   Reason for Procedure:   History of colon polyps, diverticulitis  Plan:    colonoscopy     HPI: Stephanie [name] is a 72 y.o. female  here for colonoscopy - had an advanced adenoma removed in piecemeal 10/2018, follow up exam done in 07/2019 - told to repeat in 3 years. She has had recurrent sigmoid diverticulitis on a few occasions since that time, last in September.    Patient has occasional constipation. Otherwise feels well without any cardiopulmonary symptoms. She does have a history of AF, on Eliquis, has been held for 2 days. In AF on monitor, initially HR was up to 130s to 150s transiently. Given dose of esmolol and now in low 100s, BP normal. Have discussed with the patient, risks  benefits of the exam and anesthesia, she wishes to proceed, likely he dehydrated from prep, and giving fluids as well.  I have discussed risks / benefits of anesthesia and endoscopic procedure with [name] and they wish to proceed with the exams as outlined today.    Past Medical History:  Diagnosis Date   Anemia    Ankle fracture, right    x 2   Antineoplastic and immunosuppressive drugs causing adverse effect in therapeutic use    Asthma    Chronic venous hypertension without complications    Colon polyps 2009   Coronary artery disease    no CAD by cath 11.2010   Depressive disorder, not elsewhere classified    Diabetes mellitus type 2 in obese    hgb AIC 6.1 09/02/2009, insulin dependent    Diastolic CHF, chronic (HCC)    reports stable   Diverticulosis of colon 2009   colonoscopy   Gout    cortisone  shots helped   Hepatitis, unspecified    hx of/per pt, never was told of having hepatitis   Hx of hysterectomy 2002   Hyperlipidemia    Hypertension    controlled on medication    Hypothyroidism    reports her thryoid levels are normal now    Irritable bowel syndrome    Kidney  disease    Lung nodule    12mm RLL nodule on CT Chest  07/2009, resolved by 2011 CT   Noncompliance    Obesity    Persistent atrial fibrillation (HCC)    s/p DCCV 07/2009; Pradaxa Rx, states she is in sinus rhythm now    PONV (postoperative nausea and vomiting)    after receiving "too much anesthesia" after a hernia surgery    Sarcoidosis    dx by eye exam; seen during eye exam , report asymtptomatic "i dont have it now'    Sleep disturbance 06/2013   sleep study, mild abnormality, no criteria for CPAP    Past Surgical History:  Procedure Laterality Date   ABDOMINAL HYSTERECTOMY     CARDIOVERSION     x 2   CARDIOVERSION N/A 02/27/2013   Procedure: CARDIOVERSION;  Surgeon: Lewayne Bunting, MD;  Location: [facility] ENDOSCOPY;  Service: Cardiovascular;  Laterality: N/A;   CARDIOVERSION N/A 03/24/2017   Procedure: CARDIOVERSION;  Surgeon: Wendall Stade, MD;  Location: [facility] ENDOSCOPY;  Service: Cardiovascular;  Laterality: N/A;   CARDIOVERSION N/A 03/04/2020   Procedure: CARDIOVERSION;  Surgeon: Wendall Stade, MD;  Location: [facility] ENDOSCOPY;  Service: Cardiovascular;  Laterality: N/A;   CARDIOVERSION N/A 06/04/2023   Procedure: CARDIOVERSION;  Surgeon: Sande Rives, MD;  Location: [facility] INVASIVE CV LAB;  Service: Cardiovascular;  Laterality: N/A;   CARDIOVERSION N/A 08/16/2023   Procedure: CARDIOVERSION;  Surgeon: Chrystie Nose, MD;  Location: [facility] INVASIVE CV LAB;  Service: Cardiovascular;  Laterality: N/A;   CHOLECYSTECTOMY  early 80s   COLONOSCOPY     Diverticulosis, colon polyps, repeat 2014, Dr. Arlyce Dice   COLONOSCOPY WITH PROPOFOL N/A 07/25/2019   Procedure: COLONOSCOPY WITH PROPOFOL;  Surgeon: Benancio Deeds, MD;  Location: [facility] ENDOSCOPY;  Service: Gastroenterology;  Laterality: N/A;   ESOPHAGOGASTRODUODENOSCOPY N/A 05/16/2014   Procedure: ESOPHAGOGASTRODUODENOSCOPY (EGD);  Surgeon: Graylin Shiver, MD;  Location: [facility] ORS;  Service: Gastroenterology;  Laterality: N/A;   HERNIA REPAIR   2009   umbilical hernia   MYOMECTOMY  2000   POLYPECTOMY  07/25/2019   Procedure: POLYPECTOMY;  Surgeon: Benancio Deeds, MD;  Location: [facility] ENDOSCOPY;  Service: Gastroenterology;;    Prior to Admission medications   Medication Sig Start Date End Date Taking? Authorizing Provider  allopurinol (ZYLOPRIM) 100 MG tablet TAKE 1/2 TABLET(50 MG) BY MOUTH TWICE DAILY 08/27/22  Yes Rodolph Bong, MD  ALPRAZolam Prudy Feeler) 0.5 MG tablet Take 1 tablet (0.5 mg total) by mouth 3 (three) times daily as needed for anxiety. Patient taking differently: Take 0.5 mg by mouth at bedtime as needed for anxiety or sleep. 04/19/23  Yes [name], MD  colchicine 0.6 MG tablet Take 0.6 mg by mouth daily as needed (gout flares). 12/25/21  Yes [provider]  metoprolol succinate (TOPROL-XL) 25 MG 24 hr tablet TAKE 1 TABLET(25 MG) BY MOUTH DAILY Patient taking differently: Take 25 mg by mouth daily in the afternoon. 04/27/23  Yes Duke Salvia, MD  sacubitril-valsartan (ENTRESTO) 24-26 MG TAKE 1 TABLET BY MOUTH TWICE DAILY 06/07/23  Yes Duke Salvia, MD  simvastatin (ZOCOR) 10 MG tablet Take 1 tablet (10 mg total) by mouth daily. Patient taking differently: Take 10 mg by mouth at bedtime. 05/24/23  Yes [name], MD  terbinafine (LAMISIL) 1 % cream Apply 1 Application topically 2 (two) times daily as needed (skin irritation/itching.).   Yes [provider]  Accu-Chek FastClix Lancets MISC  09/26/18   [provider]  amiodarone (PACERONE) 200 MG tablet Take 1 tablet (200 mg total) by mouth 2 (two) times daily for 30 days, THEN 1 tablet (200 mg total) daily. Patient not taking: Reported on 10/06/2023 08/23/23 09/21/24  Eustace Pen, PA-C  apixaban (ELIQUIS) 5 MG TABS tablet Take 1 tablet (5 mg total) by mouth 2 (two) times daily. 05/03/23   Lutgen, Megan E, RPH-CPP  Blood Pressure KIT 1 kit by Does not apply route daily. Check blood pressure once a day Patient not taking: Reported on  08/24/2023 03/11/21   Duke Salvia, MD  dronedarone (MULTAQ) 400 MG tablet 1 tablet with meals Oral Twice a day    [provider]  Finerenone (KERENDIA) 20 MG TABS Take 1 tablet (20 mg total) by mouth daily. 07/22/23   [name], MD  furosemide (LASIX) 40 MG tablet Take 1 tablet (40 mg total) by mouth daily. Patient not taking: Reported on 10/06/2023 01/11/23   [name], MD  JARDIANCE 25 MG TABS tablet TAKE 1 TABLET(25 MG) BY MOUTH DAILY 03/12/23   [name], MD  neomycin-colistin-hydrocortisone-thonzonium (CORTISPORIN-TC) 3.12-19-08-0.5 MG/ML OTIC suspension Place 3 drops into the left ear 3 (three) times daily. 08/24/23   [name], MD  potassium chloride SA (KLOR-CON M15) 15 MEQ  tablet Take 1 tablet (15 mEq total) by mouth 3 (three) times daily. Patient not taking: Reported on 08/23/2023 02/09/23   [name], MD  tirzepatide [facility]) 5 MG/0.5ML Pen Inject 5 mg into the skin once a week. 08/24/23   [name], MD    Current Outpatient Medications  Medication Sig Dispense Refill   allopurinol (ZYLOPRIM) 100 MG tablet TAKE 1/2 TABLET(50 MG) BY MOUTH TWICE DAILY 90 tablet 3   ALPRAZolam (XANAX) 0.5 MG tablet Take 1 tablet (0.5 mg total) by mouth 3 (three) times daily as needed for anxiety. (Patient taking differently: Take 0.5 mg by mouth at bedtime as needed for anxiety or sleep.) 270 tablet 0   colchicine 0.6 MG tablet Take 0.6 mg by mouth daily as needed (gout flares).     metoprolol succinate (TOPROL-XL) 25 MG 24 hr tablet TAKE 1 TABLET(25 MG) BY MOUTH DAILY (Patient taking differently: Take 25 mg by mouth daily in the afternoon.) 90 tablet 3   sacubitril-valsartan (ENTRESTO) 24-26 MG TAKE 1 TABLET BY MOUTH TWICE DAILY 180 tablet 3   simvastatin (ZOCOR) 10 MG tablet Take 1 tablet (10 mg total) by mouth daily. (Patient taking differently: Take 10 mg by mouth at bedtime.) 90 tablet 1   terbinafine (LAMISIL) 1 % cream Apply 1 Application topically 2 (two)  times daily as needed (skin irritation/itching.).     Accu-Chek FastClix Lancets MISC  (Patient not taking: Reported on 08/24/2023)     amiodarone (PACERONE) 200 MG tablet Take 1 tablet (200 mg total) by mouth 2 (two) times daily for 30 days, THEN 1 tablet (200 mg total) daily. (Patient not taking: Reported on 10/06/2023) 90 tablet 3   apixaban (ELIQUIS) 5 MG TABS tablet Take 1 tablet (5 mg total) by mouth 2 (two) times daily. 60 tablet 5   Blood Pressure KIT 1 kit by Does not apply route daily. Check blood pressure once a day (Patient not taking: Reported on 08/24/2023) 1 kit 0   dronedarone (MULTAQ) 400 MG tablet 1 tablet with meals Oral Twice a day     Finerenone (KERENDIA) 20 MG TABS Take 1 tablet (20 mg total) by mouth daily. 90 tablet 0   furosemide (LASIX) 40 MG tablet Take 1 tablet (40 mg total) by mouth daily. (Patient not taking: Reported on 10/06/2023) 90 tablet 1   JARDIANCE 25 MG TABS tablet TAKE 1 TABLET(25 MG) BY MOUTH DAILY 90 tablet 1   neomycin-colistin-hydrocortisone-thonzonium (CORTISPORIN-TC) 3.12-19-08-0.5 MG/ML OTIC suspension Place 3 drops into the left ear 3 (three) times daily. 10 mL 1   potassium chloride SA (KLOR-CON M15) 15 MEQ tablet Take 1 tablet (15 mEq total) by mouth 3 (three) times daily. (Patient not taking: Reported on 08/23/2023) 270 tablet 0   tirzepatide (MOUNJARO) 5 MG/0.5ML Pen Inject 5 mg into the skin once a week. 6 mL 0   Current Facility-Administered Medications  Medication Dose Route Frequency Provider Last Rate Last Admin   0.9 %  sodium chloride infusion  500 mL Intravenous Continuous Promise Bushong, Willaim Rayas, MD        Allergies as of 10/06/2023 - Review Complete 10/06/2023  Allergen Reaction Noted   Amiodarone Other (See Comments) 11/11/2015   Aspirin Other (See Comments) 05/31/2020   Methimazole Nausea And Vomiting 05/31/2020   Propoxyphene Nausea And Vomiting 08/21/2009   Trintellix [vortioxetine] Nausea And Vomiting 06/10/2022   Fentanyl Nausea  And Vomiting 05/27/2023   Albuterol Palpitations 02/06/2013   Lipitor [atorvastatin] Other (See Comments) 08/21/2009  Livalo [pitavastatin] Other (See Comments) 05/07/2015    Family History  Problem Relation Age of Onset   Multiple sclerosis Mother        hx   Pneumonia Father        deceased   Hypertension Father    Cancer Father    Emphysema Other        runs in the family   Colon cancer Neg Hx    Colon polyps Neg Hx    Esophageal cancer Neg Hx    Stomach cancer Neg Hx    Rectal cancer Neg Hx     Social History   Socioeconomic History   Marital status: Single    Spouse name: Not on file   Number of children: 5   Years of education: college   Highest education level: Not on file  Occupational History   Occupation: Disabled.  Tobacco Use   Smoking status: Never   Smokeless tobacco: Never   Tobacco comments:    Never smoke 01/27/22  Vaping Use   Vaping status: Never Used  Substance and Sexual Activity   Alcohol use: Yes    Alcohol/week: 0.0 standard drinks of alcohol    Comment: rare   Drug use: No   Sexual activity: Not Currently    Birth control/protection: Surgical  Other Topics Concern   Not on file  Social History Narrative   Pt lives in [place] with her son.  Unemployed.  Right handed.     Education college   Social Drivers of Health   Financial Resource Strain: Medium Risk (09/06/2023)   Overall Financial Resource Strain (CARDIA)    Difficulty of Paying Living Expenses: Somewhat hard  Food Insecurity: No Food Insecurity (09/06/2023)   Hunger Vital Sign    Worried About Running Out of Food in the Last Year: Never true    Ran Out of Food in the Last Year: Never true  Transportation Needs: No Transportation Needs (09/06/2023)   PRAPARE - Administrator, Civil Service (Medical): No    Lack of Transportation (Non-Medical): No  Physical Activity: Insufficiently Active (09/06/2023)   Exercise Vital Sign    Days of Exercise per Week: 2  days    Minutes of Exercise per Session: 60 min  Stress: Stress Concern Present (09/06/2023)   [employer] of Occupational Health - Occupational Stress Questionnaire    Feeling of Stress : Rather much  Social Connections: Socially Isolated (09/06/2023)   Social Connection and Isolation Panel [NHANES]    Frequency of Communication with Friends and Family: More than three times a week    Frequency of Social Gatherings with Friends and Family: Once a week    Attends Religious Services: Never    Database administrator or Organizations: No    Attends Banker Meetings: Never    Marital Status: Divorced  Catering manager Violence: Patient Unable To Answer (09/06/2023)   Humiliation, Afraid, Rape, and Kick questionnaire    Fear of Current or Ex-Partner: Patient unable to answer    Emotionally Abused: Patient unable to answer    Physically Abused: Patient unable to answer    Sexually Abused: Patient unable to answer    Review of Systems: All other review of systems negative except as mentioned in the HPI.  Physical Exam: Vital signs BP (!) 136/92   Pulse 86   Temp (!) 97.2 F (36.2 C) (Temporal)   Ht 5\' 3"  (1.6 m)   Wt 251 lb 6.4 oz (114  kg)   SpO2 98%   BMI 44.53 kg/m   General:   Alert,  Well-developed, pleasant and cooperative in NAD Lungs:  Clear throughout to auscultation.   Heart:  atrial fibrillation Abdomen:  Soft, nontender and nondistended.   Neuro/Psych:  Alert and cooperative. Normal mood and affect. A and O x 3  Harlin Rain, MD [facility]

## 2023-10-06 NOTE — Progress Notes (Signed)
1139 Case stopped due to low BP and HR 140s to 150s. Transferred to PACU.

## 2023-10-06 NOTE — Progress Notes (Signed)
1120 HR > 100 with esmolol 25 mg given IV, MD updated, vss 

## 2023-10-06 NOTE — Progress Notes (Signed)
Pt transferred to [facility] via EMS at 1244.  Belongings given to son prior to DC.  Report given to paramedics per Erskine Squibb RN

## 2023-10-06 NOTE — Op Note (Addendum)
[facility] Patient Name: Sharea Boggs Procedure Date: 10/06/2023 11:15 AM MRN: [id number] Endoscopist: Viviann Spare P. Adela Lank , MD, [id number] Age: 72 Referring MD:  Date of Birth: [birth year] Gender: Female Account #: [id number] Procedure:                Colonoscopy Indications:              High risk colon cancer surveillance: Personal                            history of colonic polyps- advanced adenoma removed                            07/2019, since then she has had recurrent left                            sided diverticulitis Medicines:                Monitored Anesthesia Care Procedure:                Pre-Anesthesia Assessment:                           - Prior to the procedure, a History and Physical                            was performed, and patient medications and                            allergies were reviewed. The patient's tolerance of                            previous anesthesia was also reviewed. The risks                            and benefits of the procedure and the sedation                            options and risks were discussed with the patient.                            All questions were answered, and informed consent                            was obtained. Prior Anticoagulants: The patient has                            taken Eliquis (apixaban), last dose was 2 days                            prior to procedure. ASA Grade Assessment: III - A                            patient with severe systemic disease. After  reviewing the risks and benefits, the patient was                            deemed in satisfactory condition to undergo the                            procedure.                           After obtaining informed consent, the colonoscope                            was passed under direct vision. Throughout the                            procedure, the patient's blood pressure, pulse, and                             oxygen saturations were monitored continuously. The                            Olympus CF-HQ190L ([id number]) Colonoscope was                            introduced through the anus with the intention of                            advancing to the cecum. The scope was advanced to                            the sigmoid colon before the procedure was aborted.                            Medications were given. The colonoscopy was                            technically difficult and complex due to restricted                            mobility of the colon. The patient tolerated the                            procedure well. The quality of the bowel                            preparation was adequate. Scope In: 11:30:07 AM Scope Out: 11:39:53 AM Total Procedure Duration: 0 hours 9 minutes 46 seconds  Findings:                 The perianal and digital rectal examinations were                            normal.  A benign-appearing, intrinsic severe stenosis was                            found in the sigmoid colon and was non-traversed.                            Regular colonoscope was used to start the                            procedure, this was withdrawn and replaced with an                            ultraslim pediatric colonoscope. Using water                            immersion for both attempts, could not open the                            distal sigmoid colon, severely edematous /                            restricted colon. The procedure was aborted in this                            light, unable to traverse the sigmoid colon likely                            due to severe diverticular changes.                           The distal sigmoid colon was diffusely congested /                            edematous.                           Internal hemorrhoids were found during retroflexion.                           The exam was otherwise without  abnormality of what                            was visualized. Complications:            Estimated blood loss: None. Patient in atrial                            fibrillation at baseline - HR was elevated to                            120-130s or so despite esmolol (had previously                            decreased to just over 100 prior to the exam) Estimated Blood Loss:  Estimated blood loss: none. Impression:               - Congested / edematous mucosa in the sigmoid colon                            with severely restricted mobility / colonic                            stricture. Regular colonoscope and ultraslim                            pediatric colonoscope could not traverse the area                            using water immersion - procedure aborted                           - Internal hemorrhoids.                           - The examination was otherwise normal of what was                            visualized.                           Suspect sequelae from diverticulitis (stricturing /                            luminal narrowing) is causing this problem. She has                            had issues with constipation. Procedure unable to                            be performed, recommend follow up cross sectional                            imaging with CT scan and may need to see colorectal                            surgery (although she is higher than average risk                            for a surgery). In addition, atrial fibrillation /                            heart rate poorly controlled today. If this is not                            improved in the recovery area, she may need to go                            to the emergency room for management  of her heart                            rate. Recommendation:           - Patient has a contact number available for                            emergencies. The signs and symptoms of potential                             delayed complications were discussed with the                            patient. Return to normal activities tomorrow.                            Written discharge instructions were provided to the                            patient.                           - Resume previous diet.                           - Continue present medications.                           - Resume Eliquis today                           - Start Miralax twice daily and titrate up as                            needed to keep stools loose                           - CT scan abdomen / pelvis - our office will                            coordinate                           - Will discuss options with her regarding                            management of atrial fibrillation as outlined above.                           ADDENDUM: Patient's HR is fluctuating between 110s                            and 150 in recovery area. BP in low 100s. She is                            weak  and lightheaded. HR not improving. Have given                            dose of esmolol already, anesthesia concerned about                            hypotension if we give additional dosing here.                            Recommend transfer to ED via EMS to control AF with                            RVR - discussed with patient and son who agreed. Viviann Spare P. Hiliary Osorto, MD 10/06/2023 11:53:13 AM This report has been signed electronically.

## 2023-10-06 NOTE — ED Notes (Signed)
Pt provided [place] sandwich and ginger ale per EDP

## 2023-10-06 NOTE — ED Triage Notes (Signed)
Pt BIB [facility] from [facility] for abib/RVR during colonoscopy. Per pt, she was in afib already but they were hoping it would improve with meds.  They gave 50 esmolol and 100 prop but the rate did not correct for their comfort so they sent her here for eval. Pt took metoprolol this morning.   122/51, HR 150-160

## 2023-10-06 NOTE — Progress Notes (Signed)
Left lower extremity venous duplex has been completed. Preliminary results can be found in CV Proc through chart review.  Results were given to Dr. Renaye Rakers.  10/06/23 3:28 PM Olen Cordial RVT

## 2023-10-06 NOTE — Patient Instructions (Addendum)
- Resume previous diet.                           - Continue present medications.                           - Start Miralax twice daily and titrate up as                            needed to keep stools loose                           - CT scan abdomen / pelvis - our office will                            coordinate                           - Will discuss options with her regarding                            management of atrial fibrillation as outlined above.                          - Resume Eliquis today  YOU HAD AN ENDOSCOPIC PROCEDURE TODAY AT THE [facility]:   Refer to the procedure report that was given to you for any specific questions about what was found during the examination.  If the procedure report does not answer your questions, please call your gastroenterologist to clarify.  If you requested that your care partner not be given the details of your procedure findings, then the procedure report has been included in a sealed envelope for you to review at your convenience later.  YOU SHOULD EXPECT: Some feelings of bloating in the abdomen. Passage of more gas than usual.  Walking can help get rid of the air that was put into your GI tract during the procedure and reduce the bloating. If you had a lower endoscopy (such as a colonoscopy or flexible sigmoidoscopy) you may notice spotting of blood in your stool or on the toilet paper. If you underwent a bowel prep for your procedure, you may not have a normal bowel movement for a few days.  Please Note:  You might notice some irritation and congestion in your nose or some drainage.  This is from the oxygen used during your procedure.  There is no need for concern and it should clear up in a day or so.  SYMPTOMS TO REPORT IMMEDIATELY:  Following lower endoscopy (colonoscopy or flexible sigmoidoscopy):  Excessive amounts of blood in the stool  Significant tenderness or worsening of abdominal  pains  Swelling of the abdomen that is new, acute  Fever of 100F or higher  For urgent or emergent issues, a gastroenterologist can be reached at any hour by calling [phone]. Do not use MyChart messaging for urgent concerns.    DIET:  We do recommend a small meal at first, but then you may proceed to your regular diet.  Drink plenty of fluids but you should avoid alcoholic beverages for 24 hours.  ACTIVITY:  You should plan to take it easy for the rest  of today and you should NOT DRIVE or use heavy machinery until tomorrow (because of the sedation medicines used during the test).    FOLLOW UP: Our staff will call the number listed on your records the next business day following your procedure.  We will call around 7:15- 8:00 am to check on you and address any questions or concerns that you may have regarding the information given to you following your procedure. If we do not reach you, we will leave a message.     If any biopsies were taken you will be contacted by phone or by letter within the next 1-3 weeks.  Please call us at [phone] if you have not heard about the biopsies in 3 weeks.    SIGNATURES/CONFIDENTIALITY: You and/or your care partner have signed paperwork which will be entered into your electronic medical record.  These signatures attest to the fact that that the information above on your After Visit Summary has been reviewed and is understood.  Full responsibility of the confidentiality of this discharge information lies with you and/or your care-partner.

## 2023-10-06 NOTE — Progress Notes (Signed)
1123 HR > 100 with esmolol 25 mg given IV, MD updated, vss

## 2023-10-06 NOTE — Discharge Instructions (Addendum)
Please follow-up with your doctor or cardiologist in the office.  Continue taking your normal home medications.  I prescribed you oxycodone which is an opioid painkiller medicine.  This is for severe pain only in your left knee.  You can continue taking Tylenol otherwise, ice your knee as needed, and keep it elevated.  Do not mix and take oxycodone at the same time as Xanax.  Do not drive after taking oxycodone.

## 2023-10-07 ENCOUNTER — Other Ambulatory Visit: Payer: Self-pay

## 2023-10-07 ENCOUNTER — Telehealth: Payer: Self-pay | Admitting: *Deleted

## 2023-10-07 MED ORDER — POLYETHYLENE GLYCOL 3350 17 G PO PACK: 17.0000 g | PACK | Freq: Two times a day (BID) | ORAL | 3 refills | Status: AC

## 2023-10-07 NOTE — Telephone Encounter (Signed)
Attempted f/u phone call. No answer. Left message. °

## 2023-10-07 NOTE — Telephone Encounter (Signed)
I have spoken to patient to advise of CT time/date/location/prep. She verbalizes understanding and is in agreement with this plan.

## 2023-10-08 ENCOUNTER — Ambulatory Visit: Payer: Self-pay | Admitting: Licensed Clinical Social Worker

## 2023-10-08 NOTE — Patient Instructions (Signed)
Visit Information  Thank you for taking time to visit with me today. Please don't hesitate to contact me if I can be of assistance to you.   Following are the goals we discussed today:   Goals Addressed             This Visit's Progress    Care Coordination       Call patient experience to report concerns regarding recent hospital visit [phone].           Please call the care guide team at [phone] if you need to cancel or reschedule your appointment.   If you are experiencing a Mental Health or Behavioral Health Crisis or need someone to talk to, please call 911   Patient verbalizes understanding of instructions and care plan provided today and agrees to view in MyChart. Active MyChart status and patient understanding of how to access instructions and care plan via MyChart confirmed with patient.     The patient has been provided with contact information for the care management team and has been advised to call with any health related questions or concerns.    Gwyndolyn Saxon MSW, LCSW Licensed Clinical Social Worker  [facility], Population Health Direct Dial: [phone]  Fax: [phone]

## 2023-10-08 NOTE — Patient Outreach (Signed)
  Care Coordination   Initial Visit Note   10/08/2023 Name: Stephanie [name] MRN: [id number] DOB: 29-Dec-1950  Stephanie [name] is a 72 y.o. year old female who sees [name], MD for primary care. I spoke with  [name] by phone today.  What matters to the patients health and wellness today?  Stephanie [name] reports concerns regarding recent hospital visit.     Goals Addressed             This Visit's Progress    Care Coordination       Call patient experience to report concerns regarding recent hospital visit [phone].         SDOH assessments and interventions completed:  Yes  SDOH Interventions Today    Flowsheet Row Most Recent Value  SDOH Interventions   Food Insecurity Interventions Intervention Not Indicated  Housing Interventions Intervention Not Indicated  Transportation Interventions Intervention Not Indicated  Utilities Interventions Intervention Not Indicated        Care Coordination Interventions:  Yes, provided  Interventions Today    Flowsheet Row Most Recent Value  Chronic Disease   Chronic disease during today's visit Atrial Fibrillation (AFib)  Education Interventions   Education Provided Provided Education  Provided Verbal Education On Other  [Pt express concern regarding care received in the hospital. LCSW A. Felton Clinton advised Ms. Boaz she can report her concerns with patient experince [phone]]       Follow up plan: No further intervention required.   Encounter Outcome:  Patient Visit Completed   Gwyndolyn Saxon MSW, LCSW Licensed Clinical Social Worker  [facility], Population Health Direct Dial: [phone]  Fax: [phone]

## 2023-10-14 ENCOUNTER — Ambulatory Visit (HOSPITAL_COMMUNITY)
Admission: RE | Admit: 2023-10-14 | Discharge: 2023-10-14 | Disposition: A | Discharge status: Home / Self Care | Payer: 59 | Source: Ambulatory Visit | Attending: Gastroenterology | Admitting: Gastroenterology

## 2023-10-14 DIAGNOSIS — R188 Other ascites: Secondary | ICD-10-CM | POA: Diagnosis not present

## 2023-10-14 DIAGNOSIS — Z8719 Personal history of other diseases of the digestive system: Secondary | ICD-10-CM | POA: Diagnosis not present

## 2023-10-14 DIAGNOSIS — Z9049 Acquired absence of other specified parts of digestive tract: Secondary | ICD-10-CM | POA: Diagnosis not present

## 2023-10-14 DIAGNOSIS — K5732 Diverticulitis of large intestine without perforation or abscess without bleeding: Secondary | ICD-10-CM | POA: Diagnosis not present

## 2023-10-14 DIAGNOSIS — K56699 Other intestinal obstruction unspecified as to partial versus complete obstruction: Secondary | ICD-10-CM | POA: Diagnosis not present

## 2023-10-14 MED ORDER — IOHEXOL 300 MG/ML  SOLN
100.0000 mL | Freq: Once | INTRAMUSCULAR | Status: AC | PRN
  Administered 2023-10-14: 100 mL via INTRAVENOUS

## 2023-10-14 MED ORDER — IOHEXOL 300 MG/ML  SOLN
30.0000 mL | Freq: Once | INTRAMUSCULAR | Status: AC | PRN
  Administered 2023-10-14: 30 mL via ORAL

## 2023-10-21 ENCOUNTER — Other Ambulatory Visit: Payer: Self-pay

## 2023-10-21 MED ORDER — AMOXICILLIN-POT CLAVULANATE 875-125 MG PO TABS
1.0000 | ORAL_TABLET | Freq: Two times a day (BID) | ORAL | 0 refills | Status: AC
Start: 2023-10-21 — End: 2023-11-04

## 2023-11-10 ENCOUNTER — Ambulatory Visit (HOSPITAL_COMMUNITY): Payer: 59 | Admitting: Internal Medicine

## 2023-11-12 ENCOUNTER — Other Ambulatory Visit: Payer: Self-pay | Admitting: Internal Medicine

## 2023-11-12 DIAGNOSIS — E1122 Type 2 diabetes mellitus with diabetic chronic kidney disease: Secondary | ICD-10-CM

## 2023-11-22 ENCOUNTER — Telehealth: Payer: Self-pay | Admitting: Gastroenterology

## 2023-11-22 NOTE — Telephone Encounter (Signed)
Patient is informed of appointment with Dr Maisie Fus on 11/23/23 at 1120 am. She verbalizes understanding.

## 2023-11-22 NOTE — Telephone Encounter (Signed)
Patient called stating her phone had a virus and lost all information, patient would like to speak with a nurse for more information stating she has an appointment for 11/23/2023 but does not know where or what time. Patient states Dr. Adela Lank had referred her elsewhere

## 2023-11-23 ENCOUNTER — Telehealth: Payer: Self-pay | Admitting: *Deleted

## 2023-11-23 DIAGNOSIS — I482 Chronic atrial fibrillation, unspecified: Secondary | ICD-10-CM | POA: Diagnosis not present

## 2023-11-23 DIAGNOSIS — K56699 Other intestinal obstruction unspecified as to partial versus complete obstruction: Secondary | ICD-10-CM | POA: Diagnosis not present

## 2023-11-23 NOTE — Telephone Encounter (Signed)
   Pre-operative Risk Assessment    Patient Name: Stephanie [name]  DOB: Feb 19, 1951 MRN: [id number]   Date of last office visit: 08/06/2023 Date of next office visit: None   Request for Surgical Clearance    Procedure:   Colon Surgery  Date of Surgery:  Clearance TBD                                 Surgeon:  Dr. Bernarda Ned Surgeon's Group or Practice Name:  [facility] Phone number:  [phone] Fax number:  [phone]   Type of Clearance Requested:   - Medical  - Pharmacy:  Hold Apixaban  (Eliquis ) Not Indicated.   Type of Anesthesia:  General    Additional requests/questions:    Signed, Edsel Grayce Sanders   11/23/2023, 4:36 PM

## 2023-11-25 NOTE — Telephone Encounter (Signed)
 Patient with diagnosis of afib on Eliquis  for anticoagulation.    Procedure: colon surgery Date of procedure: TBD   CHA2DS2-VASc Score = 6   This indicates a 9.7% annual risk of stroke. The patient's score is based upon: CHF History: 1 HTN History: 1 Diabetes History: 1 Stroke History: 0 Vascular Disease History: 1 Age Score: 1 Gender Score: 1      CrCl 52 ml/min Platelet count 204  Per office protocol, patient can hold Eliquis  for 2 days prior to procedure.    **This guidance is not considered finalized until pre-operative APP has relayed final recommendations.**

## 2023-11-26 ENCOUNTER — Ambulatory Visit (HOSPITAL_COMMUNITY)
Admission: EM | Admit: 2023-11-26 | Discharge: 2023-11-26 | Disposition: A | Discharge status: Home / Self Care | Payer: 59 | Attending: Physician Assistant | Admitting: Physician Assistant

## 2023-11-26 ENCOUNTER — Telehealth: Payer: Self-pay | Admitting: *Deleted

## 2023-11-26 ENCOUNTER — Encounter (HOSPITAL_COMMUNITY): Payer: Self-pay

## 2023-11-26 ENCOUNTER — Ambulatory Visit: Payer: Self-pay | Admitting: Internal Medicine

## 2023-11-26 DIAGNOSIS — R300 Dysuria: Secondary | ICD-10-CM | POA: Diagnosis not present

## 2023-11-26 DIAGNOSIS — N390 Urinary tract infection, site not specified: Secondary | ICD-10-CM | POA: Diagnosis not present

## 2023-11-26 DIAGNOSIS — R319 Hematuria, unspecified: Secondary | ICD-10-CM | POA: Diagnosis not present

## 2023-11-26 LAB — POCT URINALYSIS DIP (MANUAL ENTRY)
Bilirubin, UA: NEGATIVE
Glucose, UA: >=1000 mg/dL — AB
Nitrite, UA: POSITIVE — AB
Protein Ur, POC: 30 mg/dL — AB
Spec Grav, UA: 1.02 (ref 1.010–1.025)
Urobilinogen, UA: 0.2 U/dL
pH, UA: 5.5 (ref 5.0–8.0)

## 2023-11-26 MED ORDER — NITROFURANTOIN MONOHYD MACRO 100 MG PO CAPS: 100.0000 mg | ORAL_CAPSULE | Freq: Two times a day (BID) | ORAL | 0 refills | Status: AC

## 2023-11-26 MED ORDER — PHENAZOPYRIDINE HCL 100 MG PO TABS: 100.0000 mg | ORAL_TABLET | Freq: Three times a day (TID) | ORAL | 0 refills | Status: DC | PRN

## 2023-11-26 NOTE — Telephone Encounter (Signed)
 Pt has been scheduled tele preop appt 12/09/23. Med rec and consent are done.

## 2023-11-26 NOTE — Telephone Encounter (Signed)
   Name: Stephanie [name]  DOB: Mar 05, 1951  MRN: [id number]  Primary Cardiologist: None   Preoperative team, please contact this patient and set up a phone call appointment for further preoperative risk assessment. Please obtain consent and complete medication review. Thank you for your help.  I confirm that guidance regarding antiplatelet and oral anticoagulation therapy has been completed and, if necessary, noted below.  Per office protocol, patient can hold Eliquis  for 2 days prior to procedure.   I also confirmed the patient resides in the state of [place] . As per [facility] Medical Board telemedicine laws, the patient must reside in the state in which the provider is licensed.   [name], NP 11/26/2023, 8:47 AM [facility]

## 2023-11-26 NOTE — Telephone Encounter (Signed)
 Copied from CRM [id number]. Topic: Clinical - Red Word Triage >> Nov 26, 2023 12:51 PM Drema MATSU wrote: Red Word that prompted transfer to Nurse Triage: Patient thinks she may have a UTI. Symptoms- cloudy, painful when urinating, cant hold urine, and inability to urinate.   Chief Complaint: Urinary Symptoms Symptoms: Cloudy Urine, Dysuria, Frequency, Loss of Appetite Frequency: Acute Pertinent Negatives: Patient denies fever Disposition: [] ED /[x] Urgent Care (no appt availability in office) / [x] Appointment(In office/virtual)/ []   Virtual Care/ [] Home Care/ [] Refused Recommended Disposition /[]  Mobile Bus/ []  Follow-up with PCP Additional Notes: LB is being triaged for urinary symptoms that are acute in nature. The patient complains of frequency, urgency, incontinence, and flank pain. Referred the patient to UC, and made in office appointment for Monday. Verbalized understanding of disposition.    Reason for Disposition  Side (flank) or lower back pain present  Answer Assessment - Initial Assessment Questions 1. SYMPTOM: What's the main symptom you're concerned about? (e.g., frequency, incontinence)     Cloudy, Frequency, Incontinence, Dysuria  2. ONSET: When did the  symptoms  start?     1-2 Days ago  3. PAIN: Is there any pain? If Yes, ask: How bad is it? (Scale: 1-10; mild, moderate, severe)     Mild  4. CAUSE: What do you think is causing the symptoms?     Infection  5. OTHER SYMPTOMS: Do you have any other symptoms? (e.g., blood in urine, fever, flank pain, pain with urination)     Back Pain, Chills   6. PREGNANCY: Is there any chance you are pregnant? When was your last menstrual period?     No  Protocols used: Urinary Symptoms-A-AH

## 2023-11-26 NOTE — ED Triage Notes (Signed)
 Patient having urinary frequency, lower abdominal pain, painful urination, dark urine, and some urinary incontinence. Onset 2 days ago. Patient scheduled to have surgery soon for diverticulitis.   Patient has not taken any medication for her symptoms.

## 2023-11-26 NOTE — Telephone Encounter (Signed)
 Pt has been scheduled tele preop appt 12/09/23. Med rec and consent are done.      Patient Consent for Virtual Visit        Stephanie [name] has provided verbal consent on 11/26/2023 for a virtual visit (video or telephone).   CONSENT FOR VIRTUAL VISIT FOR:  Stephanie [name]  By participating in this virtual visit I agree to the following:  I hereby voluntarily request, consent and authorize [facility] and its employed or contracted physicians, physician assistants, nurse practitioners or other licensed health care professionals (the Practitioner), to provide me with telemedicine health care services (the "Services) as deemed necessary by the treating Practitioner. I acknowledge and consent to receive the Services by the Practitioner via telemedicine. I understand that the telemedicine visit will involve communicating with the Practitioner through live audiovisual communication technology and the disclosure of certain medical information by electronic transmission. I acknowledge that I have been given the opportunity to request an in-person assessment or other available alternative prior to the telemedicine visit and am voluntarily participating in the telemedicine visit.  I understand that I have the right to withhold or withdraw my consent to the use of telemedicine in the course of my care at any time, without affecting my right to future care or treatment, and that the Practitioner or I may terminate the telemedicine visit at any time. I understand that I have the right to inspect all information obtained and/or recorded in the course of the telemedicine visit and may receive copies of available information for a reasonable fee.  I understand that some of the potential risks of receiving the Services via telemedicine include:  Delay or interruption in medical evaluation due to technological equipment failure or disruption; Information transmitted may not be sufficient (e.g. poor  resolution of images) to allow for appropriate medical decision making by the Practitioner; and/or  In rare instances, security protocols could fail, causing a breach of personal health information.  Furthermore, I acknowledge that it is my responsibility to provide information about my medical history, conditions and care that is complete and accurate to the best of my ability. I acknowledge that Practitioner's advice, recommendations, and/or decision may be based on factors not within their control, such as incomplete or inaccurate data provided by me or distortions of diagnostic images or specimens that may result from electronic transmissions. I understand that the practice of medicine is not an exact science and that Practitioner makes no warranties or guarantees regarding treatment outcomes. I acknowledge that a copy of this consent can be made available to me via my patient portal [facility] MyChart), or I can request a printed copy by calling the office of [facility].    I understand that my insurance will be billed for this visit.   I have read or had this consent read to me. I understand the contents of this consent, which adequately explains the benefits and risks of the Services being provided via telemedicine.  I have been provided ample opportunity to ask questions regarding this consent and the Services and have had my questions answered to my satisfaction. I give my informed consent for the services to be provided through the use of telemedicine in my medical care

## 2023-11-26 NOTE — Discharge Instructions (Signed)
 At this time your urine dipstick was notable for signs of a UTI.  I have sent in an antibiotic called Macrobid  for you to take twice per day for 5 days.  I have also sent in a medication called Pyridium  to help with the discomfort while the antibiotic is starting to work.  We will send off a sample for urine culture so that we can identify the bacteria causing your symptoms.  If we need to make any adjustments to your antibiotic regimen we will do so once those results are back.  While we are waiting on this please make sure that you are staying well-hydrated and avoid holding your urine for prolonged periods of time.  If you start to notice that you are developing fevers, chills, abdominal pain, flank pain, confusion please go to the emergency room immediately for further evaluation and management.

## 2023-11-26 NOTE — ED Provider Notes (Signed)
 [facility]    CSN: [id number] Arrival date & time: 11/26/23  1449      History   Chief Complaint Chief Complaint  Patient presents with   Urinary Tract Infection    HPI Alga Southall is a 73 y.o. female.   HPI  Patient is here for concerns for urinary frequency, lower abdominal pain, dysuria, and dark urine for the past 2 days  She reports her symptoms have been getting worse  She reports she is scheduled to have surgery for diverticulitis soon She states she has increased urinary frequency and suprapubic pain  She also report some urinary hesitancy and incontinence  She reports lower back pain - lumbar pain.  Interventions: nothing   Past Medical History:  Diagnosis Date   Anemia    Ankle fracture, right    x 2   Antineoplastic and immunosuppressive drugs causing adverse effect in therapeutic use    Asthma    Chronic venous hypertension without complications    Colon polyps 2009   Coronary artery disease    no CAD by cath 11.2010   Depressive disorder, not elsewhere classified    Diabetes mellitus type 2 in obese    hgb AIC 6.1 09/02/2009, insulin  dependent    Diastolic CHF, chronic (HCC)    reports stable   Diverticulosis of colon 2009   colonoscopy   Gout    cortisone  shots helped   Hepatitis, unspecified    hx of/per pt, never was told of having hepatitis   Hx of hysterectomy 2002   Hyperlipidemia    Hypertension    controlled on medication    Hypothyroidism    reports her thryoid levels are normal now    Irritable bowel syndrome    Kidney disease    Lung nodule    12mm RLL nodule on CT Chest  07/2009, resolved by 2011 CT   Noncompliance    Obesity    Persistent atrial fibrillation (HCC)    s/p DCCV 07/2009; Pradaxa  Rx, states she is in sinus rhythm now    PONV (postoperative nausea and vomiting)    after receiving too much anesthesia after a hernia surgery    Rapid atrial fibrillation (HCC) 10/06/2023   Sarcoidosis    dx by eye  exam; seen during eye exam , report asymtptomatic i dont have it now'    Sleep disturbance 06/2013   sleep study, mild abnormality, no criteria for CPAP    Patient Active Problem List   Diagnosis Date Noted   Chronic eczematous otitis externa of left ear 08/24/2023   Type 2 diabetes mellitus with stage 3b chronic kidney disease, without long-term current use of insulin  (HCC) 04/20/2023   Chronic kidney disease (CKD) stage G3a/A3, moderately decreased glomerular filtration rate (GFR) between 45-59 mL/min/1.73 square meter and albuminuria creatinine ratio greater than 300 mg/g (HCC) 11/11/2022   Full incontinence of feces 11/10/2022   Picker's nodules 06/10/2022   Primary osteoarthritis involving multiple joints 06/10/2022   Dyslipidemia, goal LDL below 100 06/10/2022   Diuretic-induced hypokalemia 06/10/2022   Long term (current) use of insulin  (HCC) 03/18/2022   GAD (generalized anxiety disorder) 03/18/2022   Current severe episode of major depressive disorder without psychotic features without prior episode (HCC) 03/18/2022   Visit for screening mammogram 03/18/2022   Secondary hypercoagulable state (HCC) 09/04/2020   NICM (nonischemic cardiomyopathy) (HCC) 12/20/2019   Benign neoplasm of ascending colon    Degenerative arthritis of knee, bilateral 12/22/2017   Typical atrial flutter (  HCC)    B12 deficiency 05/06/2016   Other specified nutritional anemias 08/02/2015   Obesity 08/02/2015   Gout 08/02/2015   Cardiomyopathy, secondary (HCC) 01/28/2011   Chronic diastolic heart failure (HCC) 09/27/2009   Essential hypertension 09/02/2009   Atrial fibrillation (HCC) 08/20/2009   Lung nodule 08/20/2009   CHRONIC VENOUS HYPERTENSION WITHOUT COMPS 11/21/2007    Past Surgical History:  Procedure Laterality Date   ABDOMINAL HYSTERECTOMY     CARDIOVERSION     x 2   CARDIOVERSION N/A 02/27/2013   Procedure: CARDIOVERSION;  Surgeon: Redell GORMAN Shallow, MD;  Location: [facility] ENDOSCOPY;   Service: Cardiovascular;  Laterality: N/A;   CARDIOVERSION N/A 03/24/2017   Procedure: CARDIOVERSION;  Surgeon: Delford Maude BROCKS, MD;  Location: [facility] ENDOSCOPY;  Service: Cardiovascular;  Laterality: N/A;   CARDIOVERSION N/A 03/04/2020   Procedure: CARDIOVERSION;  Surgeon: Delford Maude BROCKS, MD;  Location: [facility] ENDOSCOPY;  Service: Cardiovascular;  Laterality: N/A;   CARDIOVERSION N/A 06/04/2023   Procedure: CARDIOVERSION;  Surgeon: Barbaraann Darryle Ned, MD;  Location: [facility] INVASIVE CV LAB;  Service: Cardiovascular;  Laterality: N/A;   CARDIOVERSION N/A 08/16/2023   Procedure: CARDIOVERSION;  Surgeon: Mona Vinie BROCKS, MD;  Location: [facility] INVASIVE CV LAB;  Service: Cardiovascular;  Laterality: N/A;   CHOLECYSTECTOMY  early 80s   COLONOSCOPY     Diverticulosis, colon polyps, repeat 2014, Dr. Debrah   COLONOSCOPY WITH PROPOFOL  N/A 07/25/2019   Procedure: COLONOSCOPY WITH PROPOFOL ;  Surgeon: Leigh Elspeth SQUIBB, MD;  Location: [facility] ENDOSCOPY;  Service: Gastroenterology;  Laterality: N/A;   ESOPHAGOGASTRODUODENOSCOPY N/A 05/16/2014   Procedure: ESOPHAGOGASTRODUODENOSCOPY (EGD);  Surgeon: Lesta JULIANNA Fitz, MD;  Location: [facility] ORS;  Service: Gastroenterology;  Laterality: N/A;   HERNIA REPAIR  2009   umbilical hernia   MYOMECTOMY  2000   POLYPECTOMY  07/25/2019   Procedure: POLYPECTOMY;  Surgeon: Leigh Elspeth SQUIBB, MD;  Location: [facility] ENDOSCOPY;  Service: Gastroenterology;;    OB History   No obstetric history on file.      Home Medications    Prior to Admission medications   Medication Sig Start Date End Date Taking? Authorizing Provider  ALPRAZolam  (XANAX ) 0.5 MG tablet Take 1 tablet (0.5 mg total) by mouth 3 (three) times daily as needed for anxiety. Patient taking differently: Take 0.5 mg by mouth at bedtime as needed for anxiety or sleep. 04/19/23  Yes Joshua Ned CROME, MD  amiodarone  (PACERONE ) 200 MG tablet Take 1 tablet (200 mg total) by mouth 2 (two) times daily for 30 days, THEN 1 tablet (200 mg total)  daily. 08/23/23 09/21/24 Yes Terra Fairy PARAS, PA-C  apixaban  (ELIQUIS ) 5 MG TABS tablet Take 1 tablet (5 mg total) by mouth 2 (two) times daily. 05/03/23  Yes Lutgen, Megan E, RPH-CPP  furosemide  (LASIX ) 40 MG tablet Take 1 tablet (40 mg total) by mouth daily. 01/11/23  Yes Joshua Ned CROME, MD  JARDIANCE  25 MG TABS tablet TAKE 1 TABLET(25 MG) BY MOUTH DAILY 03/12/23  Yes Joshua Ned CROME, MD  KERENDIA  20 MG TABS TAKE 1 TABLET(20 MG) BY MOUTH DAILY 11/13/23  Yes Joshua Ned CROME, MD  metoprolol  succinate (TOPROL -XL) 25 MG 24 hr tablet TAKE 1 TABLET(25 MG) BY MOUTH DAILY Patient taking differently: Take 25 mg by mouth daily in the afternoon. 04/27/23  Yes Fernande Elspeth BROCKS, MD  nitrofurantoin , macrocrystal-monohydrate, (MACROBID ) 100 MG capsule Take 1 capsule (100 mg total) by mouth 2 (two) times daily for 5 days. 11/26/23 12/01/23 Yes Tristyn Demarest E, PA-C  phenazopyridine  (PYRIDIUM ) 100  MG tablet Take 1 tablet (100 mg total) by mouth 3 (three) times daily as needed for pain. 11/26/23  Yes Bertrice Leder E, PA-C  sacubitril -valsartan  (ENTRESTO ) 24-26 MG TAKE 1 TABLET BY MOUTH TWICE DAILY 06/07/23  Yes Fernande Elspeth BROCKS, MD  simvastatin  (ZOCOR ) 10 MG tablet Take 1 tablet (10 mg total) by mouth daily. Patient taking differently: Take 10 mg by mouth at bedtime. 05/24/23  Yes Joshua Debby CROME, MD  Accu-Chek FastClix Lancets MISC  09/26/18   [provider]  allopurinol  (ZYLOPRIM ) 100 MG tablet TAKE 1/2 TABLET(50 MG) BY MOUTH TWICE DAILY 08/27/22   Corey, Evan S, MD  Blood Pressure KIT 1 kit by Does not apply route daily. Check blood pressure once a day Patient not taking: Reported on 08/24/2023 03/11/21   Fernande Elspeth BROCKS, MD  colchicine  0.6 MG tablet Take 0.6 mg by mouth daily as needed (gout flares). 12/25/21   [provider]  dronedarone  (MULTAQ ) 400 MG tablet 1 tablet with meals Oral Twice a day    [provider]  neomycin-colistin-hydrocortisone -thonzonium (CORTISPORIN -TC) 3.12-19-08-0.5 MG/ML OTIC  suspension Place 3 drops into the left ear 3 (three) times daily. 08/24/23   Joshua Debby CROME, MD  oxyCODONE  (ROXICODONE ) 5 MG immediate release tablet Take 1 tablet (5 mg total) by mouth every 8 (eight) hours as needed for up to 10 doses for severe pain (pain score 7-10). 10/06/23   Cottie Donnice PARAS, MD  polyethylene glycol (MIRALAX  / GLYCOLAX ) 17 g packet Take 17 g by mouth 2 (two) times daily. Titrate as needed 10/07/23   Armbruster, Elspeth SQUIBB, MD  potassium chloride  SA (KLOR-CON  M15) 15 MEQ tablet Take 1 tablet (15 mEq total) by mouth 3 (three) times daily. Patient not taking: Reported on 08/23/2023 02/09/23   Joshua Debby CROME, MD  terbinafine (LAMISIL) 1 % cream Apply 1 Application topically 2 (two) times daily as needed (skin irritation/itching.).    [provider]  tirzepatide  (MOUNJARO ) 5 MG/0.5ML Pen Inject 5 mg into the skin once a week. 08/24/23   Joshua Debby CROME, MD    Family History Family History  Problem Relation Age of Onset   Multiple sclerosis Mother        hx   Pneumonia Father        deceased   Hypertension Father    Cancer Father    Emphysema Other        runs in the family   Colon cancer Neg Hx    Colon polyps Neg Hx    Esophageal cancer Neg Hx    Stomach cancer Neg Hx    Rectal cancer Neg Hx     Social History Social History   Tobacco Use   Smoking status: Never   Smokeless tobacco: Never   Tobacco comments:    Never smoke 01/27/22  Vaping Use   Vaping status: Never Used  Substance Use Topics   Alcohol use: Yes    Alcohol/week: 0.0 standard drinks of alcohol    Comment: rare   Drug use: No     Allergies   Amiodarone , Aspirin , Methimazole , Propoxyphene, Trintellix  [vortioxetine ], Fentanyl , Albuterol , Lipitor [atorvastatin], and Livalo  [pitavastatin ]   Review of Systems Review of Systems  Constitutional:  Positive for chills, diaphoresis and fever (subjective).  Genitourinary:  Positive for difficulty urinating (urinary hesitancy), dysuria  and frequency. Negative for flank pain and hematuria.     Physical Exam Triage Vital Signs ED Triage Vitals  Encounter Vitals Group     BP 11/26/23  1618 (!) 142/91     Systolic BP Percentile --      Diastolic BP Percentile --      Pulse Rate 11/26/23 1618 87     Resp 11/26/23 1618 18     Temp 11/26/23 1618 97.9 F (36.6 C)     Temp Source 11/26/23 1618 Oral     SpO2 11/26/23 1618 98 %     Weight 11/26/23 1618 251 lb 5.2 oz (114 kg)     Height 11/26/23 1618 5' 3 (1.6 m)     Head Circumference --      Peak Flow --      Pain Score 11/26/23 1613 9     Pain Loc --      Pain Education --      Exclude from Growth Chart --    No data found.  Updated Vital Signs BP (!) 142/91 (BP Location: Right Arm)   Pulse 87   Temp 97.9 F (36.6 C) (Oral)   Resp 18   Ht 5' 3 (1.6 m)   Wt 251 lb 5.2 oz (114 kg)   SpO2 98%   BMI 44.52 kg/m   Visual Acuity Right Eye Distance:   Left Eye Distance:   Bilateral Distance:    Right Eye Near:   Left Eye Near:    Bilateral Near:     Physical Exam Vitals reviewed.  Constitutional:      General: She is awake.     Appearance: Normal appearance. She is well-developed and well-groomed.  HENT:     Head: Normocephalic and atraumatic.  Eyes:     General: Lids are normal. Gaze aligned appropriately.     Extraocular Movements: Extraocular movements intact.     Conjunctiva/sclera: Conjunctivae normal.  Pulmonary:     Effort: Pulmonary effort is normal.  Neurological:     General: No focal deficit present.     Mental Status: She is alert and oriented to person, place, and time.     GCS: GCS eye subscore is 4. GCS verbal subscore is 5. GCS motor subscore is 6.     Cranial Nerves: No cranial nerve deficit, dysarthria or facial asymmetry.  Psychiatric:        Attention and Perception: Attention and perception normal.        Mood and Affect: Mood and affect normal.        Speech: Speech normal.        Behavior: Behavior normal. Behavior is  cooperative.      UC Treatments / Results  Labs (all labs ordered are listed, but only abnormal results are displayed) Labs Reviewed  POCT URINALYSIS DIP (MANUAL ENTRY) - Abnormal; Notable for the following components:      Result Value   Color, UA straw (*)    Clarity, UA cloudy (*)    Glucose, UA >=1,000 (*)    Ketones, POC UA small (15) (*)    Blood, UA trace-intact (*)    Protein Ur, POC =30 (*)    Nitrite, UA Positive (*)    Leukocytes, UA Trace (*)    All other components within normal limits  URINE CULTURE    EKG   Radiology No results found.  Procedures Procedures (including critical care time)  Medications Ordered in UC Medications - No data to display  Initial Impression / Assessment and Plan / UC Course  I have reviewed the triage vital signs and the nursing notes.  Pertinent labs & imaging results that were available  during my care of the patient were reviewed by me and considered in my medical decision making (see chart for details).      Final Clinical Impressions(s) / UC Diagnoses   Final diagnoses:  Dysuria  Urinary tract infection with hematuria, site unspecified   Acute, new concern Patient presents today for concerns for increased urinary frequency, dysuria, suprapubic pain that has been ongoing for the last 2 days.  Her urine dip was notable for blood, nitrites, leukocytes which appears consistent with a UTI.  Will send in Macrobid  p.o. twice daily x 5 days along with Pyridium  for pain management.  Recommend that she stays well-hydrated and avoid holding urine for prolonged periods.  Will send urine culture for ID and susceptibility testing.  Results to dictate further management.  ED and return precautions reviewed and provided in after visit summary.  Follow-up as needed   Discharge Instructions      At this time your urine dipstick was notable for signs of a UTI.  I have sent in an antibiotic called Macrobid  for you to take twice per day  for 5 days.  I have also sent in a medication called Pyridium  to help with the discomfort while the antibiotic is starting to work.  We will send off a sample for urine culture so that we can identify the bacteria causing your symptoms.  If we need to make any adjustments to your antibiotic regimen we will do so once those results are back.  While we are waiting on this please make sure that you are staying well-hydrated and avoid holding your urine for prolonged periods of time.  If you start to notice that you are developing fevers, chills, abdominal pain, flank pain, confusion please go to the emergency room immediately for further evaluation and management.     ED Prescriptions     Medication Sig Dispense Auth. Provider   nitrofurantoin , macrocrystal-monohydrate, (MACROBID ) 100 MG capsule Take 1 capsule (100 mg total) by mouth 2 (two) times daily for 5 days. 10 capsule Robina Hamor E, PA-C   phenazopyridine  (PYRIDIUM ) 100 MG tablet Take 1 tablet (100 mg total) by mouth 3 (three) times daily as needed for pain. 10 tablet Ava Tangney E, PA-C      PDMP not reviewed this encounter.   Marylene Rocky BRAVO, PA-C 11/26/23 1743

## 2023-11-28 LAB — URINE CULTURE: Culture: >=100000 — AB

## 2023-11-29 ENCOUNTER — Ambulatory Visit: Payer: 59 | Admitting: Family Medicine

## 2023-11-29 ENCOUNTER — Ambulatory Visit: Payer: Self-pay | Admitting: Internal Medicine

## 2023-11-29 NOTE — Telephone Encounter (Signed)
 Chief Complaint: left hand / fingers swelling after taking antibiotic macrobid  for urinary issues Symptoms: left hand swelling between thumb and finger , dime size. Not sleeping. Reports can not make fist and can not use hand.  Frequency: Saturday / Sunday Pertinent Negatives: Patient denies chest pain no difficulty breathing no swelling up arm no fever Disposition: [x]ED /[]Urgent Care (no appt availability in office) / []Appointment(In office/virtual)/ [] [facility]/ []Home Care/ []Refused Recommended Disposition /[][facility]/ [] Follow-up with PCP Additional Notes:   Recommended ED due to not able to use hand. Patient declined. Offered appt and patient declined. Patient reports she went to UC Friday and prescribed macrobid for UTI sx. Did not receive pyridum from pharmacy . Patient reports pharmacy told her it was OTC medication and it probably would not help anyway . Please advise. Attempted to contact [facility] #[phone] to notify of patient refusal to ED and "call could not be completed as dialed."          Copied from CRM #[id number]. Topic: Clinical - Red Word Triage >> Nov 29, 2023  8:07 AM Andrea W wrote: Red Word that prompted transfer to Nurse Triage: swollen hand and throbbing patient think its the medication that she was put on when she went to urgent care on Friday She went to urgent care for her urine infection , when she went to urgent care her hand was not swollen or throbbing Reason for Disposition  [1] Can\'t use hand or can barely use hand AND [2] new-onset  Answer Assessment - Initial Assessment Questions 1. ONSET: "When did the swelling start?" (e.g., minutes, hours, days)     Saturday / Sunday  2. LOCATION: "What part of the hand is swollen?"  "Are both hands swollen or just one hand?"     Left hand between thumb and index finger 3. SEVERITY: "How bad is the swelling?" (e.g., localized; mild, moderate, severe)   - BALL OR LUMP: small ball or  lump   - LOCALIZED: puffy or swollen area or patch of skin   - JOINT SWELLING: swelling of a joint   - MILD: puffiness or mild swelling of fingers or hand   - MODERATE: fingers and hand are swollen   - SEVERE: swelling of entire hand and up into forearm     mild 4. REDNESS: "Does the swelling look red or infected?"     Sort of  5. PAIN: "Is the swelling painful to touch?" If Yes, ask: "How painful is it?"   (Scale 1-10; mild, moderate or severe)     10  /10 6. FEVER: "Do you have a fever?" If Yes, ask: "What is it, how was it measured, and when did it start?"      Not sure  7. CAUSE: "What do you think is causing the hand swelling?" (e.g., heat, insect bite, pregnancy, recent injury)     Not sure  8. MEDICAL HISTORY: "Do you have a history of heart failure, kidney disease, liver failure, or cancer?"     See hx  9. RECURRENT SYMPTOM: "Have you had hand swelling before?" If Yes, ask: "When was the last time?" "What happened that time?"     Na  10. OTHER SYMPTOMS: "Do you have any other symptoms?" (e.g., blurred vision, difficulty breathing, headache)       Throbbing  hand swelling can not make a fist 11. PREGNANCY: "Is there any chance you are pregnant?" "When was your last menstrual period?"  na  Protocols used: Hand Swelling-A-AH

## 2023-12-02 ENCOUNTER — Ambulatory Visit (INDEPENDENT_AMBULATORY_CARE_PROVIDER_SITE_OTHER): Payer: 59

## 2023-12-02 ENCOUNTER — Encounter: Payer: Self-pay | Admitting: Internal Medicine

## 2023-12-02 ENCOUNTER — Ambulatory Visit: Payer: 59 | Admitting: Internal Medicine

## 2023-12-02 VITALS — BP 142/88 | HR 96 | Temp 97.9°F | Resp 16 | Ht 63.0 in | Wt 247.0 lb

## 2023-12-02 DIAGNOSIS — N1831 Chronic kidney disease, stage 3a: Secondary | ICD-10-CM

## 2023-12-02 DIAGNOSIS — N1832 Chronic kidney disease, stage 3b: Secondary | ICD-10-CM | POA: Diagnosis not present

## 2023-12-02 DIAGNOSIS — I4819 Other persistent atrial fibrillation: Secondary | ICD-10-CM

## 2023-12-02 DIAGNOSIS — R051 Acute cough: Secondary | ICD-10-CM

## 2023-12-02 DIAGNOSIS — I517 Cardiomegaly: Secondary | ICD-10-CM | POA: Diagnosis not present

## 2023-12-02 DIAGNOSIS — E1122 Type 2 diabetes mellitus with diabetic chronic kidney disease: Secondary | ICD-10-CM

## 2023-12-02 DIAGNOSIS — E785 Hyperlipidemia, unspecified: Secondary | ICD-10-CM | POA: Diagnosis not present

## 2023-12-02 DIAGNOSIS — M1A9XX0 Chronic gout, unspecified, without tophus (tophi): Secondary | ICD-10-CM | POA: Diagnosis not present

## 2023-12-02 DIAGNOSIS — R059 Cough, unspecified: Secondary | ICD-10-CM | POA: Diagnosis not present

## 2023-12-02 LAB — POCT RESPIRATORY SYNCYTIAL VIRUS: RSV Rapid Ag: NEGATIVE

## 2023-12-02 LAB — POCT INFLUENZA A/B
Influenza A, POC: NEGATIVE
Influenza B, POC: NEGATIVE

## 2023-12-02 LAB — POC COVID19 BINAXNOW: SARS Coronavirus 2 Ag: NEGATIVE

## 2023-12-02 NOTE — Telephone Encounter (Signed)
  The patient declined the televisit. She wants to see Dr. Graciela Husbands since she has new symptoms of AFib. She scheduled an appointment on 12/09/23 at 3:20 PM with Dr. Graciela Husbands at the [facility] office.

## 2023-12-02 NOTE — Patient Instructions (Signed)

## 2023-12-02 NOTE — Progress Notes (Unsigned)
Subjective:  Patient ID: Stephanie [name], female    DOB: May 02, 1951  Age: 73 y.o. MRN: [id number]  CC: Cough, Diabetes, and Atrial Fibrillation   HPI [name] presents for f/up ----  Discussed the use of AI scribe software for clinical note transcription with the patient, who gave verbal consent to proceed.  History of Present Illness   Stephanie [name] is a 73 year old female with atrial fibrillation who presents with respiratory symptoms and a recent history of gout flare.  She has been experiencing respiratory symptoms, including rhinorrhea and cough, which began yesterday. These symptoms are accompanied by mild fever and chills. She has not been tested for COVID-19, RSV, or influenza. There is no history of similar illness, although she has had pneumonia before. Frequent sneezing is noted, with no hemoptysis or productive cough. Mild chest pain was experienced last Monday.  She has a history of atrial fibrillation, diagnosed on December 18th during a pre-operative evaluation for a colon procedure. Her heart rate was elevated at 180 beats per minute, leading to the cessation of the procedure and ambulance transport. She has not started her fibrillation medication, which has been concerning for her. She feels unwell since the diagnosis.  She mentions a recent flare of gout in her hand, which has improved but was initially severe enough to prevent her from attending a prior appointment.  She recently visited urgent care for a urinary tract infection and was prescribed antibiotics, which she believes may have contributed to her symptoms at that time.       Outpatient Medications Prior to Visit  Medication Sig Dispense Refill   Accu-Chek FastClix Lancets MISC      allopurinol (ZYLOPRIM) 100 MG tablet TAKE 1/2 TABLET(50 MG) BY MOUTH TWICE DAILY 90 tablet 3   ALPRAZolam (XANAX) 0.5 MG tablet Take 1 tablet (0.5 mg total) by mouth 3 (three) times daily as needed for anxiety.  (Patient taking differently: Take 0.5 mg by mouth at bedtime as needed for anxiety or sleep.) 270 tablet 0   amiodarone (PACERONE) 200 MG tablet Take 1 tablet (200 mg total) by mouth 2 (two) times daily for 30 days, THEN 1 tablet (200 mg total) daily. 90 tablet 3   apixaban (ELIQUIS) 5 MG TABS tablet Take 1 tablet (5 mg total) by mouth 2 (two) times daily. 60 tablet 5   Blood Pressure KIT 1 kit by Does not apply route daily. Check blood pressure once a day 1 kit 0   dronedarone (MULTAQ) 400 MG tablet 1 tablet with meals Oral Twice a day     furosemide (LASIX) 40 MG tablet Take 1 tablet (40 mg total) by mouth daily. 90 tablet 1   JARDIANCE 25 MG TABS tablet TAKE 1 TABLET(25 MG) BY MOUTH DAILY 90 tablet 1   KERENDIA 20 MG TABS TAKE 1 TABLET(20 MG) BY MOUTH DAILY 90 tablet 0   metoprolol succinate (TOPROL-XL) 25 MG 24 hr tablet TAKE 1 TABLET(25 MG) BY MOUTH DAILY (Patient taking differently: Take 25 mg by mouth daily in the afternoon.) 90 tablet 3   neomycin-colistin-hydrocortisone-thonzonium (CORTISPORIN-TC) 3.12-19-08-0.5 MG/ML OTIC suspension Place 3 drops into the left ear 3 (three) times daily. 10 mL 1   oxyCODONE (ROXICODONE) 5 MG immediate release tablet Take 1 tablet (5 mg total) by mouth every 8 (eight) hours as needed for up to 10 doses for severe pain (pain score 7-10). 10 tablet 0   phenazopyridine (PYRIDIUM) 100 MG tablet Take 1 tablet (  100 mg total) by mouth 3 (three) times daily as needed for pain. 10 tablet 0   polyethylene glycol (MIRALAX / GLYCOLAX) 17 g packet Take 17 g by mouth 2 (two) times daily. Titrate as needed 60 packet 3   potassium chloride SA (KLOR-CON M15) 15 MEQ tablet Take 1 tablet (15 mEq total) by mouth 3 (three) times daily. 270 tablet 0   sacubitril-valsartan (ENTRESTO) 24-26 MG TAKE 1 TABLET BY MOUTH TWICE DAILY 180 tablet 3   terbinafine (LAMISIL) 1 % cream Apply 1 Application topically 2 (two) times daily as needed (skin irritation/itching.).     tirzepatide  (MOUNJARO) 5 MG/0.5ML Pen Inject 5 mg into the skin once a week. 6 mL 0   colchicine 0.6 MG tablet Take 0.6 mg by mouth daily as needed (gout flares).     simvastatin (ZOCOR) 10 MG tablet Take 1 tablet (10 mg total) by mouth daily. (Patient taking differently: Take 10 mg by mouth at bedtime.) 90 tablet 1   No facility-administered medications prior to visit.    ROS Review of Systems  Constitutional:  Positive for chills, fatigue and fever.  HENT:  Positive for postnasal drip and rhinorrhea. Negative for facial swelling, sinus pain, sneezing and sore throat.   Eyes:  Negative for visual disturbance.  Respiratory:  Positive for cough. Negative for chest tightness, shortness of breath and wheezing.   Cardiovascular:  Negative for chest pain, palpitations and leg swelling.  Gastrointestinal:  Negative for abdominal pain, constipation, diarrhea, nausea and vomiting.  Genitourinary: Negative.  Negative for difficulty urinating.  Musculoskeletal:  Positive for arthralgias. Negative for back pain, joint swelling and myalgias.  Skin: Negative.  Negative for color change and pallor.  Neurological: Negative.  Negative for dizziness and weakness.  Hematological:  Negative for adenopathy. Does not bruise/bleed easily.  Psychiatric/Behavioral: Negative.      Objective:  BP (!) 142/88 (BP Location: Right Arm, Cuff Size: Normal)   Pulse 96   Temp 97.9 F (36.6 C) (Temporal)   Resp 16   Ht 5\' 3"  (1.6 m)   Wt 247 lb (112 kg)   SpO2 98%   BMI 43.75 kg/m   BP Readings from Last 3 Encounters:  12/02/23 (!) 142/88  11/26/23 (!) 142/91  10/06/23 117/76    Wt Readings from Last 3 Encounters:  12/02/23 247 lb (112 kg)  11/26/23 251 lb 5.2 oz (114 kg)  10/06/23 251 lb 6.4 oz (114 kg)    Physical Exam Vitals reviewed.  Constitutional:      General: She is not in acute distress.    Appearance: She is not ill-appearing, toxic-appearing or diaphoretic.  HENT:     Nose: Nose normal.      Mouth/Throat:     Mouth: Mucous membranes are moist.  Eyes:     General: No scleral icterus.    Conjunctiva/sclera: Conjunctivae normal.  Cardiovascular:     Rate and Rhythm: Normal rate. Rhythm regularly irregular.     Pulses: Normal pulses.     Heart sounds: No murmur heard.    No gallop.  Pulmonary:     Effort: Pulmonary effort is normal.     Breath sounds: No stridor. No wheezing, rhonchi or rales.  Abdominal:     General: Abdomen is flat.     Palpations: There is no mass.     Tenderness: There is no abdominal tenderness. There is no guarding.     Hernia: No hernia is present.  Musculoskeletal:  General: Normal range of motion.     Cervical back: Neck supple.     Right lower leg: No edema.     Left lower leg: No edema.  Skin:    General: Skin is warm and dry.  Neurological:     General: No focal deficit present.     Mental Status: She is alert. Mental status is at baseline.  Psychiatric:        Mood and Affect: Mood normal.        Behavior: Behavior normal.     Lab Results  Component Value Date   WBC 7.3 12/02/2023   HGB 11.9 (L) 12/02/2023   HCT 36.1 12/02/2023   PLT 277.0 12/02/2023   GLUCOSE 95 12/02/2023   CHOL 208 (H) 04/19/2023   TRIG 117.0 04/19/2023   HDL 59.70 04/19/2023   LDLCALC 125 (H) 04/19/2023   ALT 9 06/27/2023   AST 11 (L) 06/27/2023   NA 143 12/02/2023   K 3.7 12/02/2023   CL 107 12/02/2023   CREATININE 1.08 12/02/2023   BUN 19 12/02/2023   CO2 24 12/02/2023   TSH 2.45 12/02/2023   INR 1.2 (H) 04/05/2013   HGBA1C 5.7 07/22/2023   MICROALBUR <0.7 05/24/2023   DG Chest 2 View Result Date: 12/02/2023 CLINICAL DATA:  Cough. EXAM: CHEST - 2 VIEW COMPARISON:  09/29/2023 FINDINGS: Moderate cardiac enlargement. There is no evidence of pulmonary edema, consolidation, pneumothorax, nodule or pleural fluid. The visualized skeletal structures are unremarkable. IMPRESSION: Moderate cardiac enlargement. No acute findings. Electronically Signed    By: Irish Lack M.D.   On: 12/02/2023 17:34     Assessment & Plan:   Acute cough- The work-up is c/w a Viral URI. -     DG Chest 2 View; Future -     POCT Influenza A/B -     POCT respiratory syncytial virus -     POC COVID-19 BinaxNow  Chronic gout without tophus, unspecified cause, unspecified site -     Uric acid; Future -     Colchicine; Take 1 tablet (0.6 mg total) by mouth daily.  Dispense: 90 tablet; Refill: 0  Chronic kidney disease (CKD) stage G3a/A3, moderately decreased glomerular filtration rate (GFR) between 45-59 mL/min/1.73 square meter and albuminuria creatinine ratio greater than 300 mg/g (HCC)- Renal function has improved. -     Urinalysis, Routine w reflex microscopic; Future -     Basic metabolic panel; Future -     CBC with Differential/Platelet; Future  Persistent atrial fibrillation (HCC)- She has good rate control. -     TSH; Future -     CBC with Differential/Platelet; Future -     AMB Referral [facility] Care Management  Type 2 diabetes mellitus with stage 3b chronic kidney disease, without long-term current use of insulin (HCC) -     Hemoglobin A1c; Future -     AMB Referral [facility] Care Management  Dyslipidemia, goal LDL below 100 -     Simvastatin; Take 1 tablet (10 mg total) by mouth at bedtime.  Dispense: 90 tablet; Refill: 0 -     AMB Referral [facility] Care Management     Follow-up: Return if symptoms worsen or fail to improve.  Sanda Linger, MD

## 2023-12-02 NOTE — Telephone Encounter (Signed)
I will update all parties involved pt has appt inoffice 12/09/23 with Dr. Graciela Husbands per the pt's request.

## 2023-12-03 ENCOUNTER — Encounter: Payer: Self-pay | Admitting: Internal Medicine

## 2023-12-03 ENCOUNTER — Telehealth: Payer: Self-pay

## 2023-12-03 LAB — URINALYSIS, ROUTINE W REFLEX MICROSCOPIC
Hgb urine dipstick: NEGATIVE
Ketones, ur: 15 — AB
Leukocytes,Ua: NEGATIVE
Nitrite: NEGATIVE
Specific Gravity, Urine: 1.025 (ref 1.000–1.030)
Total Protein, Urine: 30 — AB
Urine Glucose: >=1000 — AB
Urobilinogen, UA: 0.2 (ref 0.0–1.0)
pH: 5.5 (ref 5.0–8.0)

## 2023-12-03 LAB — CBC WITH DIFFERENTIAL/PLATELET
Basophils Absolute: 0 10*3/uL (ref 0.0–0.1)
Basophils Relative: 0.7 % (ref 0.0–3.0)
Eosinophils Absolute: 0 10*3/uL (ref 0.0–0.7)
Eosinophils Relative: 0.6 % (ref 0.0–5.0)
HCT: 36.1 % (ref 36.0–46.0)
Hemoglobin: 11.9 g/dL — ABNORMAL LOW (ref 12.0–15.0)
Lymphocytes Relative: 29.5 % (ref 12.0–46.0)
Lymphs Abs: 2.2 10*3/uL (ref 0.7–4.0)
MCHC: 32.9 g/dL (ref 30.0–36.0)
MCV: 94.1 fL (ref 78.0–100.0)
Monocytes Absolute: 0.5 10*3/uL (ref 0.1–1.0)
Monocytes Relative: 6.8 % (ref 3.0–12.0)
Neutro Abs: 4.6 10*3/uL (ref 1.4–7.7)
Neutrophils Relative %: 62.4 % (ref 43.0–77.0)
Platelets: 277 10*3/uL (ref 150.0–400.0)
RBC: 3.84 Mil/uL — ABNORMAL LOW (ref 3.87–5.11)
RDW: 15.9 % — ABNORMAL HIGH (ref 11.5–15.5)
WBC: 7.3 10*3/uL (ref 4.0–10.5)

## 2023-12-03 LAB — BASIC METABOLIC PANEL
BUN: 19 mg/dL (ref 6–23)
CO2: 24 meq/L (ref 19–32)
Calcium: 9.7 mg/dL (ref 8.4–10.5)
Chloride: 107 meq/L (ref 96–112)
Creatinine, Ser: 1.08 mg/dL (ref 0.40–1.20)
GFR: 51.7 mL/min — ABNORMAL LOW (ref >60.00)
Glucose, Bld: 95 mg/dL (ref 70–99)
Potassium: 3.7 meq/L (ref 3.5–5.1)
Sodium: 143 meq/L (ref 135–145)

## 2023-12-03 LAB — TSH: TSH: 2.45 u[IU]/mL (ref 0.35–5.50)

## 2023-12-03 LAB — HEMOGLOBIN A1C: Hgb A1c MFr Bld: 5.2 % (ref 4.6–6.5)

## 2023-12-03 LAB — URIC ACID: Uric Acid, Serum: 6.1 mg/dL (ref 2.4–7.0)

## 2023-12-03 MED ORDER — COLCHICINE 0.6 MG PO TABS: 0.6000 mg | ORAL_TABLET | Freq: Every day | ORAL | 0 refills | Status: AC

## 2023-12-03 MED ORDER — SIMVASTATIN 10 MG PO TABS: 10.0000 mg | ORAL_TABLET | Freq: Every day | ORAL | 0 refills | Status: DC

## 2023-12-03 NOTE — Telephone Encounter (Signed)
Copied from CRM [id number]. Topic: Clinical - Medication Question >> Dec 03, 2023  3:52 PM Stephanie [name] wrote: Reason for CRM: Pt called to follow up on a prescription for prednisone. Pt states the provider was supposed to prescribe it yesterday for gout. Please call pt back at [phone]

## 2023-12-08 NOTE — Telephone Encounter (Signed)
Patient was sent in COLCHICINE and it has been picked up

## 2023-12-09 ENCOUNTER — Ambulatory Visit: Payer: 59 | Admitting: Internal Medicine

## 2023-12-09 ENCOUNTER — Telehealth: Payer: 59

## 2023-12-09 NOTE — Telephone Encounter (Signed)
 To be seen today

## 2023-12-14 ENCOUNTER — Telehealth: Payer: Self-pay

## 2023-12-14 NOTE — Progress Notes (Signed)
 Care Guide Pharmacy Note  12/14/2023 Name: Stephanie [name] MRN: [id number] DOB: 04-Oct-1951  Referred By: [name], MD Reason for referral: Care Coordination (Outreach to schedule with Pharm d )   [name] is a 73 y.o. year old female who is a primary care patient of [name], MD.  Araminta Zorn was referred to the pharmacist for assistance related to: Atrial Fibrillation and DMII  Successful contact was made with the patient to discuss pharmacy services including being ready for the pharmacist to call at least 5 minutes before the scheduled appointment time and to have medication bottles and any blood pressure readings ready for review. The patient agreed to meet with the pharmacist via telephone visit on (date/time).12/28/2023  Penne Lash , RMA     [place]  [employer] [facility] Guide  Direct Dial: [phone]  Website: [email or web address]

## 2023-12-16 ENCOUNTER — Ambulatory Visit: Payer: 59 | Attending: Internal Medicine | Admitting: Internal Medicine

## 2023-12-16 ENCOUNTER — Telehealth: Payer: Self-pay | Admitting: Internal Medicine

## 2023-12-16 VITALS — BP 150/80 | HR 106 | Ht 63.0 in | Wt 247.6 lb

## 2023-12-16 DIAGNOSIS — I4819 Other persistent atrial fibrillation: Secondary | ICD-10-CM

## 2023-12-16 MED ORDER — METOPROLOL SUCCINATE ER 50 MG PO TB24: 50.0000 mg | ORAL_TABLET | Freq: Every day | ORAL | 3 refills | Status: DC

## 2023-12-16 MED ORDER — DABIGATRAN ETEXILATE MESYLATE 150 MG PO CAPS: 150.0000 mg | ORAL_CAPSULE | Freq: Two times a day (BID) | ORAL | 3 refills | Status: DC

## 2023-12-16 NOTE — Patient Instructions (Addendum)
 Medication Instructions:  FINISH Eliquis  Once finished, start Pradaxa 150 mg twice daily   INCREASE Metoprolol to 50 mg daily   TAKE Lasix 40 mg twice daily for 5 days.   *If you need a refill on your cardiac medications before your next appointment, please call your pharmacy*   Lab Work: Your provider would like for you to have following labs drawn today LFT, CBC, BMET.   If you have labs (blood work) drawn today and your tests are completely normal, you will receive your results only by: MyChart Message (if you have MyChart) OR A paper copy in the mail If you have any lab test that is abnormal or we need to change your treatment, we will call you to review the results.   Testing/Procedures:  Your physician has requested that you have an echocardiogram (4 weeks). Echocardiography is a painless test that uses sound waves to create images of your heart. It provides your doctor with information about the size and shape of your heart and how well your heart's chambers and valves are working.   You may receive an ultrasound enhancing agent through an IV if needed to better visualize your heart during the echo. This procedure takes approximately one hour.  There are no restrictions for this procedure.  This will take place at [address] [facility]) [address], [place] [address]  Please note: We ask at that you not bring children with you during ultrasound (echo/ vascular) testing. Due to room size and safety concerns, children are not allowed in the ultrasound rooms during exams. Our front office staff cannot provide observation of children in our lobby area while testing is being conducted. An adult accompanying a patient to their appointment will only be allowed in the ultrasound room at the discretion of the ultrasound technician under special circumstances. We apologize for any inconvenience.   Your physician has recommended that you have a Cardioversion (DCCV).  Electrical Cardioversion uses a jolt of electricity to your heart either through paddles or wired patches attached to your chest. This is a controlled, usually prescheduled, procedure. Defibrillation is done under light anesthesia in the hospital, and you usually go home the day of the procedure. This is done to get your heart back into a normal rhythm. You are not awake for the procedure. Please see the instruction sheet given to you today.   Follow-Up: At [facility], you and your health needs are our priority.  As part of our continuing mission to provide you with exceptional heart care, we have created designated Provider Care Teams.  These Care Teams include your primary Cardiologist (physician) and Advanced Practice Providers (APPs -  Physician Assistants and Nurse Practitioners) who all work together to provide you with the care you need, when you need it.  We recommend signing up for the patient portal called "MyChart".  Sign up information is provided on this After Visit Summary.  MyChart is used to connect with patients for Virtual Visits (Telemedicine).  Patients are able to view lab/test results, encounter notes, upcoming appointments, etc.  Non-urgent messages can be sent to your provider as well.   To learn more about what you can do with MyChart, go to [email or web address].    Your next appointment:   6 week(s) telehealth visit  Provider:   Sherryl Manges, MD    Other Instructions     Dear Stephanie [name]  You are scheduled for a Cardioversion on Tuesday, March 18 with Dr. Servando Salina.  Please arrive at the [facility] (Main Entrance A) at [facility]: [address] [place], [place] [address] at 9:00 AM (This time is 2 hour(s) before your procedure to ensure your preparation).   Free valet parking service is available. You will check in at ADMITTING.   *Please Note: You will receive a call the day before your procedure to confirm the appointment time. That time may  have changed from the original time based on the schedule for that day.*   DIET:  Nothing to eat or drink after midnight except a sip of water with medications (see medication instructions below)  MEDICATION INSTRUCTIONS: !!IF ANY NEW MEDICATIONS ARE STARTED AFTER TODAY, PLEASE NOTIFY YOUR PROVIDER AS SOON AS POSSIBLE!!  FYI: Medications such as Semaglutide (Ozempic, [place]), Tirzepatide (Mounjaro, Zepbound), Dulaglutide (Trulicity), etc ("GLP1 agonists") AND Canagliflozin (Invokana), Dapagliflozin (Farxiga), Empagliflozin (Jardiance), Ertugliflozin ([place]atro), Bexagliflozin [employer]) or any combination with one of these drugs such as Invokamet (Canagliflozin/Metformin), Synjardy (Empagliflozin/Metformin), etc ("SGLT2 inhibitors") must be held around the time of a procedure. This is not a comprehensive list of all of these drugs. Please review all of your medications and talk to your provider if you take any one of these. If you are not sure, ask your provider.    Hold Jardiance for 3 days prior to procedure   HOLD: Empagliflozin (Jardiance) for 3 days prior to the procedure. Last dose on Friday, March 14.  Continue taking your anticoagulant (blood thinner): Apixaban (Eliquis).  You will need to continue this after your procedure until you are told by your provider that it is safe to stop.    LABS: completed today    FYI:  For your safety, and to allow [place] to monitor your vital signs accurately during the surgery/procedure we request: If you have artificial nails, gel coating, SNS etc, please have those removed prior to your surgery/procedure. Not having the nail coverings /polish removed may result in cancellation or delay of your surgery/procedure.  Your support person will be asked to wait in the waiting room during your procedure.  It is OK to have someone drop you off and come back when you are ready to be discharged.  You cannot drive after the procedure and will need someone to drive  you home.  Bring your insurance cards.  *Special Note: Every effort is made to have your procedure done on time. Occasionally there are emergencies that occur at the hospital that may cause delays. Please be patient if a delay does occur.

## 2023-12-16 NOTE — Progress Notes (Unsigned)
 Patient Care Team: [name], MD as PCP - General (Internal Medicine) Duke Salvia, MD as PCP - Electrophysiology (Cardiology) Manning Charity, OD as Referring Physician (Optometry)   HPI  Stephanie [name] is a 73 y.o. female  Seen in followup for atrial fibrillation in the context of morbid obesity ocular sarcoid and mild left ventricular hypertrophy.  Rate related cardiomyopathy with interval normalization  She saw Dr. Fawn Kirk 3/15 to consider catheter ablation.  She was not interested. He reduced her amiodarone to 200 mg a day. Encouraged her to refrain from alcohol and to work on blood pressure and weight reduction.  She had a sleep study 2014 with an AHI of 7.5   She is now being followed by Dr. Sharl Ma and he is working both on her thyroid as well as her diabetes.  Recurrent atrial fibrillation resulted in dronaderone, having declined dofetilide >> unaware of her ongoing atrial fibrillation today.  Functional status is stable with moderate dyspnea on exertion, no edema.  No chest pain.  Some lightheadedness with standing.  Recurrent cardiomyopathy with interval improvement following GDMT  Thromboembolic risk factors ( age -33 , HTN-1, DM-1, CHF-+/-, Gender-1) for a CHADSVASc Score of >=4  Recurrent ***  Also with anticipated colonoscopy     DATE TEST EF   2012 [facility]  Cors w/o obs dis  3/12 Echo     25 %   8/14 Echo  55-60 %   12/17 Echo  25-30% LAE (47/2.0/45)  5/19 Echo 15-20%   12/19 Echo  55-65%   8/21 Echo 45-50%   01/22 Echo 40-45%      Date TSH Cr K Hgb  6/17 1.9     1/18 <<0.01     3/18 2.2     6/18 7.4 1.1  12.6  5/19  1.09 4.6 11.1  9/19  1.58 4.9 11.8  11/19  1.42 5.2   5/20 4.02     6/21   1.33 3.7 11.2  6/22  1.66 4.8   10/22  1.43 4.1   7/24 3.28 1.87 4.2 11.9     Past Medical History:  Diagnosis Date   Anemia    Ankle fracture, right    x 2   Antineoplastic and immunosuppressive drugs causing adverse effect in therapeutic use     Asthma    Chronic venous hypertension without complications    Colon polyps 2009   Coronary artery disease    no CAD by cath 11.2010   Depressive disorder, not elsewhere classified    Diabetes mellitus type 2 in obese    hgb AIC 6.1 09/02/2009, insulin dependent    Diastolic CHF, chronic (HCC)    reports stable   Diverticulosis of colon 2009   colonoscopy   Gout    cortisone  shots helped   Hepatitis, unspecified    hx of/per pt, never was told of having hepatitis   Hx of hysterectomy 2002   Hyperlipidemia    Hypertension    controlled on medication    Hypothyroidism    reports her thryoid levels are normal now    Irritable bowel syndrome    Kidney disease    Lung nodule    12mm RLL nodule on CT Chest  07/2009, resolved by 2011 CT   Noncompliance    Obesity    Persistent atrial fibrillation (HCC)    s/p DCCV 07/2009; Pradaxa Rx, states she is in sinus rhythm now    PONV (postoperative nausea and  vomiting)    after receiving "too much anesthesia" after a hernia surgery    Rapid atrial fibrillation (HCC) 10/06/2023   Sarcoidosis    dx by eye exam; seen during eye exam , report asymtptomatic "i dont have it now'    Sleep disturbance 06/2013   sleep study, mild abnormality, no criteria for CPAP    Past Surgical History:  Procedure Laterality Date   ABDOMINAL HYSTERECTOMY     CARDIOVERSION     x 2   CARDIOVERSION N/A 02/27/2013   Procedure: CARDIOVERSION;  Surgeon: Lewayne Bunting, MD;  Location: [facility] ENDOSCOPY;  Service: Cardiovascular;  Laterality: N/A;   CARDIOVERSION N/A 03/24/2017   Procedure: CARDIOVERSION;  Surgeon: Wendall Stade, MD;  Location: [facility] ENDOSCOPY;  Service: Cardiovascular;  Laterality: N/A;   CARDIOVERSION N/A 03/04/2020   Procedure: CARDIOVERSION;  Surgeon: Wendall Stade, MD;  Location: [facility] ENDOSCOPY;  Service: Cardiovascular;  Laterality: N/A;   CARDIOVERSION N/A 06/04/2023   Procedure: CARDIOVERSION;  Surgeon: Sande Rives, MD;  Location: [facility]  INVASIVE CV LAB;  Service: Cardiovascular;  Laterality: N/A;   CARDIOVERSION N/A 08/16/2023   Procedure: CARDIOVERSION;  Surgeon: Chrystie Nose, MD;  Location: [facility] INVASIVE CV LAB;  Service: Cardiovascular;  Laterality: N/A;   CHOLECYSTECTOMY  early 80s   COLONOSCOPY     Diverticulosis, colon polyps, repeat 2014, Dr. Arlyce Dice   COLONOSCOPY WITH PROPOFOL N/A 07/25/2019   Procedure: COLONOSCOPY WITH PROPOFOL;  Surgeon: Benancio Deeds, MD;  Location: [facility] ENDOSCOPY;  Service: Gastroenterology;  Laterality: N/A;   ESOPHAGOGASTRODUODENOSCOPY N/A 05/16/2014   Procedure: ESOPHAGOGASTRODUODENOSCOPY (EGD);  Surgeon: Graylin Shiver, MD;  Location: [facility] ORS;  Service: Gastroenterology;  Laterality: N/A;   HERNIA REPAIR  2009   umbilical hernia   MYOMECTOMY  2000   POLYPECTOMY  07/25/2019   Procedure: POLYPECTOMY;  Surgeon: Benancio Deeds, MD;  Location: [facility] ENDOSCOPY;  Service: Gastroenterology;;    Current Outpatient Medications  Medication Sig Dispense Refill   Accu-Chek FastClix Lancets MISC      allopurinol (ZYLOPRIM) 100 MG tablet TAKE 1/2 TABLET(50 MG) BY MOUTH TWICE DAILY 90 tablet 3   ALPRAZolam (XANAX) 0.5 MG tablet Take 1 tablet (0.5 mg total) by mouth 3 (three) times daily as needed for anxiety. 270 tablet 0   amiodarone (PACERONE) 200 MG tablet Take 1 tablet (200 mg total) by mouth 2 (two) times daily for 30 days, THEN 1 tablet (200 mg total) daily. (Patient taking differently: Take 1 tablet (200 mg total) by mouth 2 (two) times daily for 30 days, THEN 1 tablet (200 mg total) daily. Taking 100 mg daily ) 90 tablet 3   Blood Pressure KIT 1 kit by Does not apply route daily. Check blood pressure once a day 1 kit 0   colchicine 0.6 MG tablet Take 1 tablet (0.6 mg total) by mouth daily. 90 tablet 0   furosemide (LASIX) 40 MG tablet Take 1 tablet (40 mg total) by mouth daily. 90 tablet 1   JARDIANCE 25 MG TABS tablet TAKE 1 TABLET(25 MG) BY MOUTH DAILY 90 tablet 1   KERENDIA 20 MG TABS TAKE  1 TABLET(20 MG) BY MOUTH DAILY 90 tablet 0   metoprolol succinate (TOPROL-XL) 25 MG 24 hr tablet TAKE 1 TABLET(25 MG) BY MOUTH DAILY 90 tablet 3   neomycin-colistin-hydrocortisone-thonzonium (CORTISPORIN-TC) 3.12-19-08-0.5 MG/ML OTIC suspension Place 3 drops into the left ear 3 (three) times daily. 10 mL 1   polyethylene glycol (MIRALAX / GLYCOLAX) 17 g packet Take 17 g  by mouth 2 (two) times daily. Titrate as needed 60 packet 3   sacubitril-valsartan (ENTRESTO) 24-26 MG TAKE 1 TABLET BY MOUTH TWICE DAILY 180 tablet 3   simvastatin (ZOCOR) 10 MG tablet Take 1 tablet (10 mg total) by mouth at bedtime. 90 tablet 0   terbinafine (LAMISIL) 1 % cream Apply 1 Application topically 2 (two) times daily as needed (skin irritation/itching.).     No current facility-administered medications for this visit.    Allergies  Allergen Reactions   Amiodarone Other (See Comments)    Tremor/gait disurbance   Aspirin Other (See Comments)    "Burns my stomach"   Methimazole Nausea And Vomiting   Propoxyphene Nausea And Vomiting   Trintellix [Vortioxetine] Nausea And Vomiting   Fentanyl Nausea And Vomiting   Albuterol Palpitations   Lipitor [Atorvastatin] Other (See Comments)    Body aches and pains--stiffness.    Livalo [Pitavastatin] Other (See Comments)    Body aches    Review of Systems negative except from HPI and PMH  Physical Exam: BP (!) 150/80 (BP Location: Left Arm, Patient Position: Sitting, Cuff Size: Normal)   Pulse (!) 106   Ht 5\' 3"  (1.6 m)   Wt 247 lb 9.6 oz (112.3 kg)   BMI 43.86 kg/m  Well developed and nourished in no acute distress HENT normal Neck supple  Clear Irregular rate and rhythm, no murmurs or gallops Abd-soft with active BS No Clubbing cyanosis trace edema Skin-warm and dry A & Oriented  Grossly normal sensory and motor function  ECG atrial fibrillation at 81 Levels-/12/36  Sinus rhythm 3/23   Assessment and  Plan  Hypertension     Atrial  fibrillation-persistent-recurrent increasing-  Cardiomyopathy non ischemic resolved intercurrently  Congestive heart failure  chronic class III  Sarcoid  Obesity - morbid   Presurgical consultation   Atrial fibrillation is persistent.  She has been started on amiodarone.  She was tachy coagulation last week.  We will set last Friday as TEE equals 0 and anticipate cardioversion the week of 3/17.  Will plan repeat echo 2 weeks thereafter and I will review this with her 2 weeks following.  Will continue her on the amiodarone.  TSH was normal she needs liver function testing for surveillance  She needs operative relief of strictures in her abdomen secondary to adhesions.  Following restoration of sinus rhythm, she could have that surgery of 4 weeks afterwards as her anticoagulation could be held.  She prefers dabigatran to apixaban.  So we will transition her to 150 mg twice daily based on the creatinine. Estimated Creatinine Clearance: 57.6 mL/min (by C-G formula based on SCr of 1.08 mg/dL).  Blood pressure is elevated; with ongoing issues with rapid heart rate, will increase her metoprolol from 25--50 mg.  Mild volume overload.  Will continue her furosemide increase it to 40 twice daily x 5 days  I should also note that she has multiple complaints about different providers in our office and the communication in our office.  "I do not know if they are racist or not. "

## 2023-12-16 NOTE — Telephone Encounter (Signed)
  Patient Consent for Virtual Visit        Stephanie [name] has provided verbal consent on 12/16/2023 for a virtual visit (video or telephone).   CONSENT FOR VIRTUAL VISIT FOR:  Stephanie [name]  By participating in this virtual visit I agree to the following:  I hereby voluntarily request, consent and authorize [facility] and its employed or contracted physicians, physician assistants, nurse practitioners or other licensed health care professionals (the Practitioner), to provide me with telemedicine health care services (the "Services") as deemed necessary by the treating Practitioner. I acknowledge and consent to receive the Services by the Practitioner via telemedicine. I understand that the telemedicine visit will involve communicating with the Practitioner through live audiovisual communication technology and the disclosure of certain medical information by electronic transmission. I acknowledge that I have been given the opportunity to request an in-person assessment or other available alternative prior to the telemedicine visit and am voluntarily participating in the telemedicine visit.  I understand that I have the right to withhold or withdraw my consent to the use of telemedicine in the course of my care at any time, without affecting my right to future care or treatment, and that the Practitioner or I may terminate the telemedicine visit at any time. I understand that I have the right to inspect all information obtained and/or recorded in the course of the telemedicine visit and may receive copies of available information for a reasonable fee.  I understand that some of the potential risks of receiving the Services via telemedicine include:  Delay or interruption in medical evaluation due to technological equipment failure or disruption; Information transmitted may not be sufficient (e.g. poor resolution of images) to allow for appropriate medical decision making by the  Practitioner; and/or  In rare instances, security protocols could fail, causing a breach of personal health information.  Furthermore, I acknowledge that it is my responsibility to provide information about my medical history, conditions and care that is complete and accurate to the best of my ability. I acknowledge that Practitioner's advice, recommendations, and/or decision may be based on factors not within their control, such as incomplete or inaccurate data provided by me or distortions of diagnostic images or specimens that may result from electronic transmissions. I understand that the practice of medicine is not an exact science and that Practitioner makes no warranties or guarantees regarding treatment outcomes. I acknowledge that a copy of this consent can be made available to me via my patient portal [facility] MyChart), or I can request a printed copy by calling the office of [facility].    I understand that my insurance will be billed for this visit.   I have read or had this consent read to me. I understand the contents of this consent, which adequately explains the benefits and risks of the Services being provided via telemedicine.  I have been provided ample opportunity to ask questions regarding this consent and the Services and have had my questions answered to my satisfaction. I give my informed consent for the services to be provided through the use of telemedicine in my medical care

## 2023-12-16 NOTE — H&P (View-Only) (Signed)
 Patient Care Team: [name], MD as PCP - General (Internal Medicine) Duke Salvia, MD as PCP - Electrophysiology (Cardiology) Manning Charity, OD as Referring Physician (Optometry)   HPI  Stephanie [name] is a 73 y.o. female  Seen in followup for atrial fibrillation in the context of morbid obesity ocular sarcoid and mild left ventricular hypertrophy.  Rate related cardiomyopathy with interval normalization  She saw Dr. Fawn Kirk 3/15 to consider catheter ablation.  She was not interested. He reduced her amiodarone to 200 mg a day. Encouraged her to refrain from alcohol and to work on blood pressure and weight reduction.  She had a sleep study 2014 with an AHI of 7.5   She is now being followed by Dr. Sharl Ma and he is working both on her thyroid as well as her diabetes.   Recurrent cardiomyopathy with interval improvement following GDMT  Thromboembolic risk factors ( age -61 , HTN-1, DM-1, CHF-+/-, Gender-1) for a CHADSVASc Score of >=4  Recurrent atrial fibrillation w very rapid rates during colonoscopy limiting the procedure but significant stricturing was found and she was referred to [place] [facility] feels she needs surgical lysis of adhesions.  No interval atrial fibrillation of which she is aware.  Chronic dyspnea on exertion.  No lightheadedness or chest pain.  Interval reinitiation of amiodarone which she at this juncture is tolerating.  Not aware of her ongoing atrial fibrillation.  Has been modestly compliant with her anticoagulation,  missed a dose last week       DATE TEST EF   2012 [facility]  Cors w/o obs dis  3/12 Echo     25 %   8/14 Echo  55-60 %   12/17 Echo  25-30% LAE (47/2.0/45)  5/19 Echo 15-20%   12/19 Echo  55-65%   8/21 Echo 45-50%   01/22 Echo 40-45%      Date TSH Cr K Hgb  6/17 1.9     1/18 <<0.01     3/18 2.2     6/18 7.4 1.1  12.6  5/19  1.09 4.6 11.1  9/19  1.58 4.9 11.8  11/19  1.42 5.2   5/20 4.02     6/21   1.33 3.7 11.2  6/22  1.66 4.8    10/22  1.43 4.1   7/24 3.28 1.87 4.2 11.9     Past Medical History:  Diagnosis Date   Anemia    Ankle fracture, right    x 2   Antineoplastic and immunosuppressive drugs causing adverse effect in therapeutic use    Asthma    Chronic venous hypertension without complications    Colon polyps 2009   Coronary artery disease    no CAD by cath 11.2010   Depressive disorder, not elsewhere classified    Diabetes mellitus type 2 in obese    hgb AIC 6.1 09/02/2009, insulin dependent    Diastolic CHF, chronic (HCC)    reports stable   Diverticulosis of colon 2009   colonoscopy   Gout    cortisone  shots helped   Hepatitis, unspecified    hx of/per pt, never was told of having hepatitis   Hx of hysterectomy 2002   Hyperlipidemia    Hypertension    controlled on medication    Hypothyroidism    reports her thryoid levels are normal now    Irritable bowel syndrome    Kidney disease    Lung nodule    12mm RLL nodule on CT  Chest  07/2009, resolved by 2011 CT   Noncompliance    Obesity    Persistent atrial fibrillation (HCC)    s/p DCCV 07/2009; Pradaxa Rx, states she is in sinus rhythm now    PONV (postoperative nausea and vomiting)    after receiving "too much anesthesia" after a hernia surgery    Rapid atrial fibrillation (HCC) 10/06/2023   Sarcoidosis    dx by eye exam; seen during eye exam , report asymtptomatic "i dont have it now'    Sleep disturbance 06/2013   sleep study, mild abnormality, no criteria for CPAP    Past Surgical History:  Procedure Laterality Date   ABDOMINAL HYSTERECTOMY     CARDIOVERSION     x 2   CARDIOVERSION N/A 02/27/2013   Procedure: CARDIOVERSION;  Surgeon: Lewayne Bunting, MD;  Location: [facility] ENDOSCOPY;  Service: Cardiovascular;  Laterality: N/A;   CARDIOVERSION N/A 03/24/2017   Procedure: CARDIOVERSION;  Surgeon: Wendall Stade, MD;  Location: [facility] ENDOSCOPY;  Service: Cardiovascular;  Laterality: N/A;   CARDIOVERSION N/A 03/04/2020   Procedure:  CARDIOVERSION;  Surgeon: Wendall Stade, MD;  Location: [facility] ENDOSCOPY;  Service: Cardiovascular;  Laterality: N/A;   CARDIOVERSION N/A 06/04/2023   Procedure: CARDIOVERSION;  Surgeon: Sande Rives, MD;  Location: [facility] INVASIVE CV LAB;  Service: Cardiovascular;  Laterality: N/A;   CARDIOVERSION N/A 08/16/2023   Procedure: CARDIOVERSION;  Surgeon: Chrystie Nose, MD;  Location: [facility] INVASIVE CV LAB;  Service: Cardiovascular;  Laterality: N/A;   CHOLECYSTECTOMY  early 80s   COLONOSCOPY     Diverticulosis, colon polyps, repeat 2014, Dr. Arlyce Dice   COLONOSCOPY WITH PROPOFOL N/A 07/25/2019   Procedure: COLONOSCOPY WITH PROPOFOL;  Surgeon: Benancio Deeds, MD;  Location: [facility] ENDOSCOPY;  Service: Gastroenterology;  Laterality: N/A;   ESOPHAGOGASTRODUODENOSCOPY N/A 05/16/2014   Procedure: ESOPHAGOGASTRODUODENOSCOPY (EGD);  Surgeon: Graylin Shiver, MD;  Location: [facility] ORS;  Service: Gastroenterology;  Laterality: N/A;   HERNIA REPAIR  2009   umbilical hernia   MYOMECTOMY  2000   POLYPECTOMY  07/25/2019   Procedure: POLYPECTOMY;  Surgeon: Benancio Deeds, MD;  Location: [facility] ENDOSCOPY;  Service: Gastroenterology;;    Current Outpatient Medications  Medication Sig Dispense Refill   Accu-Chek FastClix Lancets MISC      allopurinol (ZYLOPRIM) 100 MG tablet TAKE 1/2 TABLET(50 MG) BY MOUTH TWICE DAILY 90 tablet 3   ALPRAZolam (XANAX) 0.5 MG tablet Take 1 tablet (0.5 mg total) by mouth 3 (three) times daily as needed for anxiety. 270 tablet 0   amiodarone (PACERONE) 200 MG tablet Take 1 tablet (200 mg total) by mouth 2 (two) times daily for 30 days, THEN 1 tablet (200 mg total) daily. (Patient taking differently: Take 1 tablet (200 mg total) by mouth 2 (two) times daily for 30 days, THEN 1 tablet (200 mg total) daily. Taking 100 mg daily ) 90 tablet 3   Blood Pressure KIT 1 kit by Does not apply route daily. Check blood pressure once a day 1 kit 0   colchicine 0.6 MG tablet Take 1 tablet (0.6 mg total)  by mouth daily. 90 tablet 0   furosemide (LASIX) 40 MG tablet Take 1 tablet (40 mg total) by mouth daily. 90 tablet 1   JARDIANCE 25 MG TABS tablet TAKE 1 TABLET(25 MG) BY MOUTH DAILY 90 tablet 1   KERENDIA 20 MG TABS TAKE 1 TABLET(20 MG) BY MOUTH DAILY 90 tablet 0   metoprolol succinate (TOPROL-XL) 25 MG 24 hr tablet TAKE 1  TABLET(25 MG) BY MOUTH DAILY 90 tablet 3   neomycin-colistin-hydrocortisone-thonzonium (CORTISPORIN-TC) 3.12-19-08-0.5 MG/ML OTIC suspension Place 3 drops into the left ear 3 (three) times daily. 10 mL 1   polyethylene glycol (MIRALAX / GLYCOLAX) 17 g packet Take 17 g by mouth 2 (two) times daily. Titrate as needed 60 packet 3   sacubitril-valsartan (ENTRESTO) 24-26 MG TAKE 1 TABLET BY MOUTH TWICE DAILY 180 tablet 3   simvastatin (ZOCOR) 10 MG tablet Take 1 tablet (10 mg total) by mouth at bedtime. 90 tablet 0   terbinafine (LAMISIL) 1 % cream Apply 1 Application topically 2 (two) times daily as needed (skin irritation/itching.).     No current facility-administered medications for this visit.    Allergies  Allergen Reactions   Amiodarone Other (See Comments)    Tremor/gait disurbance   Aspirin Other (See Comments)    "Burns my stomach"   Methimazole Nausea And Vomiting   Propoxyphene Nausea And Vomiting   Trintellix [Vortioxetine] Nausea And Vomiting   Fentanyl Nausea And Vomiting   Albuterol Palpitations   Lipitor [Atorvastatin] Other (See Comments)    Body aches and pains--stiffness.    Livalo [Pitavastatin] Other (See Comments)    Body aches    Review of Systems negative except from HPI and PMH  Physical Exam: BP (!) 150/80 (BP Location: Left Arm, Patient Position: Sitting, Cuff Size: Normal)   Pulse (!) 106   Ht 5\' 3"  (1.6 m)   Wt 247 lb 9.6 oz (112.3 kg)   BMI 43.86 kg/m  Well developed and nourished in no acute distress HENT normal Neck supple  Clear Irregular rate and rhythm, no murmurs or gallops Abd-soft with active BS No Clubbing cyanosis  trace edema Skin-warm and dry A & Oriented  Grossly normal sensory and motor function  ECG atrial fibrillation at 81 Levels-/12/36  Sinus rhythm 3/23   Assessment and  Plan  Hypertension     Atrial fibrillation-persistent-recurrent increasing-  Cardiomyopathy non ischemic resolved intercurrently  Congestive heart failure  chronic class III  Sarcoid  Obesity - morbid   Presurgical consultation   Atrial fibrillation is persistent.  She has been started on amiodarone.  She was tachy coagulation last week.  We will set last Friday as TEE equals 0 and anticipate cardioversion the week of 3/17.  Will plan repeat echo 2 weeks thereafter and I will review this with her 2 weeks following.  Will continue her on the amiodarone.  TSH was normal she needs liver function testing for surveillance  She needs operative relief of strictures in her abdomen secondary to adhesions.  Following restoration of sinus rhythm, she could have that surgery of 4 weeks afterwards as her anticoagulation could be held.  She prefers dabigatran to apixaban.  So we will transition her to 150 mg twice daily based on the creatinine. Estimated Creatinine Clearance: 57.6 mL/min (by C-G formula based on SCr of 1.08 mg/dL).  Blood pressure is elevated; with ongoing issues with rapid heart rate, will increase her metoprolol from 25--50 mg.  Mild volume overload.  Will continue her furosemide increase it to 40 twice daily x 5 days  I should also note that she has multiple complaints about different providers in our office and the communication in our office.  "I do not know if they are racist or not. "

## 2023-12-17 LAB — HEPATIC FUNCTION PANEL
ALT: 9 [IU]/L (ref 0–32)
AST: 11 [IU]/L (ref 0–40)
Albumin: 4.2 g/dL (ref 3.8–4.8)
Alkaline Phosphatase: 89 [IU]/L (ref 44–121)
Bilirubin Total: 0.3 mg/dL (ref 0.0–1.2)
Bilirubin, Direct: 0.1 mg/dL (ref 0.00–0.40)
Total Protein: 7.2 g/dL (ref 6.0–8.5)

## 2023-12-17 LAB — BASIC METABOLIC PANEL
BUN/Creatinine Ratio: 17 (ref 12–28)
BUN: 19 mg/dL (ref 8–27)
CO2: 20 mmol/L (ref 20–29)
Calcium: 9.7 mg/dL (ref 8.7–10.3)
Chloride: 107 mmol/L — ABNORMAL HIGH (ref 96–106)
Creatinine, Ser: 1.13 mg/dL — ABNORMAL HIGH (ref 0.57–1.00)
Glucose: 156 mg/dL — ABNORMAL HIGH (ref 70–99)
Potassium: 3.6 mmol/L (ref 3.5–5.2)
Sodium: 144 mmol/L (ref 134–144)
eGFR: 52 mL/min/{1.73_m2} — ABNORMAL LOW (ref >59)

## 2023-12-17 LAB — CBC
Hematocrit: 36.2 % (ref 34.0–46.6)
Hemoglobin: 11.7 g/dL (ref 11.1–15.9)
MCH: 30.3 pg (ref 26.6–33.0)
MCHC: 32.3 g/dL (ref 31.5–35.7)
MCV: 94 fL (ref 79–97)
Platelets: 196 10*3/uL (ref 150–450)
RBC: 3.86 x10E6/uL (ref 3.77–5.28)
RDW: 13.7 % (ref 11.7–15.4)
WBC: 5.4 10*3/uL (ref 3.4–10.8)

## 2023-12-21 ENCOUNTER — Ambulatory Visit: Payer: 59 | Admitting: Internal Medicine

## 2023-12-27 NOTE — Progress Notes (Unsigned)
 12/28/2023 Name: Stephanie [name] MRN: [id number] DOB: 09-Feb-1951  Chief Complaint  Patient presents with   Atrial Fibrillation   Diabetes   Karsen Fellows is a 73 y.o. year old female who presented for a telephone visit.   They were referred to the pharmacist by their PCP for assistance in managing atrial fibrillation and diabetes.   Subjective:  Care Team: Primary Care Provider: [name], MD ; Next Scheduled Visit: 09/07/24 Cardiologist: Duke Salvia, MD; Next Scheduled Visit: 01/27/24  Medication Access/Adherence  Current Pharmacy:  [id number] [place], [place] - [address] AT [facility] OF [facility] & [address] [place] [address] Phone: [phone] Fax: [phone]  [facility] [place] (New Address) - [place], [place] - [address] [facility] AT Previously: [place] [address], [place] [address] [facility] [address] 4th Floor [address] [place] [address] Phone: [phone] Fax: [phone]  [id number] - [place], [place] - [address] AT [facility] OF [address] [place] [address] Phone: [phone] Fax: [phone]   Patient reports affordability concerns with their medications: No  Patient reports access/transportation concerns to their pharmacy: No  Patient reports adherence concerns with their medications:  Yes    Reports missing 2 doses of simvastatin at night last week   Diabetes: Needs refill for [facility] - last dose was last Sunday, 3/2 , cardiology discontinued mistakenly after noting med was not picked up since 08/2023  Current medications: Jardiance 25mg  daily, Finerenone 20mg  daily, Mounjaro 5mg  every Sunday Medications tried in the past: Novolog, Hospital doctor, Lantus, Glargine-lixisenatide, Lispro, metformin, Mounjaro (missed refill due to recent CV event - afib)  Does not check BG  Patient denies  hypoglycemic s/sx including dizziness, shakiness, sweating.  Patient denies hyperglycemic symptoms including polyuria, polydipsia, polyphagia, nocturia, neuropathy, blurred vision.  Current medication access support: None   Atrial Fibrillation: Reports taking amiodarone once or twice daily, but is aware to take twice daily   Current medications:  Rate Control: Metoprolol Succinate XL 50mg  daily  Rhythm Control: Amiodarone 200mg  daily Anticoagulation Regimen: Dabigatran 150mg  BID  Medications tried in the past: Dronedarone, dofetilide, apixaban   CHADS2VASC: 5  Patient reports hypotensive s/sx including dizziness, staggered feeling   Patient denies hypertensive symptoms including headache, chest pain, shortness of breath  Current medication access support: None   Hypertension/Heart Failure (EF 45-50%):  Current medications:  ACEi/ARB/ARNI:  Entresto 24-26mg  BID SGLT2i: Jardiance 25mg  daily  Beta blocker: Metoprolol Succinate 50mg  daily  Mineralocorticoid Receptor Antagonist: Spironolactone was discontinued due to hyperkalemia and increased serum creatinine   Diuretic regimen: Furosemide 40mg  daily   Current home blood pressure readings: Patient reported not regularly checking BP at home. Most recent clinic BP reading: 150/80 mmHg. Mentioned that recently has been eating salt containing foods, such as pretzels.   Current medication access support: None   Gout:  Current medications: allopurinol 50mg  twice daily, colchicine 0.6mg  daily  Patient reported taking allopurinol once or twice a day.  Complaint of pain with index finger, which was thought to be gout flare however she feels it may not be due to gout - reports will be seeing hand specialist for further work up   Objective:      BP Readings from Last 1 Encounters:  12/16/23 (!) 150/80    Lab Results  Component Value Date   HGBA1C 5.2 12/02/2023    Lab Results  Component Value Date   CREATININE 1.13 (H)  12/16/2023   BUN 19 12/16/2023   NA 144 12/16/2023   K 3.6 12/16/2023   CL 107 (H) 12/16/2023   CO2 20 12/16/2023    Lab Results  Component Value Date   CHOL 208 (H) 04/19/2023   HDL 59.70 04/19/2023   LDLCALC 125 (H) 04/19/2023   TRIG 117.0 04/19/2023   CHOLHDL 3 04/19/2023    Medications Reviewed Today     Reviewed by Verdene Rio, RPH (Pharmacist) on 12/28/23 at 1314  Med List Status: <None>   Medication Order Taking? Sig Documenting Provider Last Dose Status Informant  Accu-Chek FastClix Lancets MISC [id number]   [provider]  Active Self  allopurinol (ZYLOPRIM) 100 MG tablet [id number] Yes TAKE 1/2 TABLET(50 MG) BY MOUTH TWICE DAILY Rodolph Bong, MD Taking Active Self  ALPRAZolam Prudy Feeler) 0.5 MG tablet [id number]  Take 1 tablet (0.5 mg total) by mouth 3 (three) times daily as needed for anxiety. [name], MD  Active Self  amiodarone (PACERONE) 200 MG tablet [id number] Yes Take 1 tablet (200 mg total) by mouth 2 (two) times daily for 30 days, THEN 1 tablet (200 mg total) daily.  Patient taking differently: Take 1 tablet (200 mg total) by mouth 2 (two) times daily for 30 days, THEN 1 tablet (200 mg total) daily. Taking 100 mg daily    Ermelinda Das Taking Active   Blood Pressure KIT [id number]  1 kit by Does not apply route daily. Check blood pressure once a day Duke Salvia, MD  Active Self  colchicine 0.6 MG tablet [id number] Yes Take 1 tablet (0.6 mg total) by mouth daily. [name], MD Taking Active   dabigatran (PRADAXA) 150 MG CAPS capsule [id number] Yes Take 1 capsule (150 mg total) by mouth 2 (two) times daily. Duke Salvia, MD Taking Active   furosemide (LASIX) 40 MG tablet [id number]  Take 1 tablet (40 mg total) by mouth daily. [name], MD  Active Self  JARDIANCE 25 MG TABS tablet [id number] Yes TAKE 1 TABLET(25 MG) BY MOUTH DAILY [name], MD Taking Active Self  KERENDIA 20 MG TABS [id number] Yes TAKE 1 TABLET(20 MG) BY  MOUTH DAILY [name], MD Taking Active   metoprolol succinate (TOPROL-XL) 50 MG 24 hr tablet [id number] Yes Take 1 tablet (50 mg total) by mouth daily. Duke Salvia, MD Taking Active   neomycin-colistin-hydrocortisone-thonzonium (CORTISPORIN-TC) 3.12-19-08-0.5 MG/ML OTIC suspension [id number]  Place 3 drops into the left ear 3 (three) times daily. [name], MD  Active   polyethylene glycol (MIRALAX / GLYCOLAX) 17 g packet [id number]  Take 17 g by mouth 2 (two) times daily. Titrate as needed Armbruster, Willaim Rayas, MD  Active   sacubitril-valsartan [facility]) 24-26 [place] [id number] Yes TAKE 1 TABLET BY MOUTH TWICE DAILY Duke Salvia, MD Taking Active Self  simvastatin (ZOCOR) 10 MG tablet [id number] Yes Take 1 tablet (10 mg total) by mouth at bedtime. [name], MD Taking Active   terbinafine (LAMISIL) 1 % cream [id number]  Apply 1 Application topically 2 (two) times daily as needed (skin irritation/itching.). [provider]  Active Self  tirzepatide Greggory Keen) 5 MG/0.5ML Pen [id number] Yes Inject 5 mg into the skin once a week. [name], MD Taking Active               Assessment/Plan:  Diabetes: - Currently controlled, A1c goal <7.5% - Reviewed long term cardiovascular  and renal outcomes of uncontrolled blood sugar - Reviewed goal A1c, goal fasting, and goal 2 hour post prandial glucose - Sent refill for [facility]  Hypertension/Heart failure: - Currently uncontrolled, goal <130/80 mmHg - Repeat echo scheduled for 12/16/23  - Reviewed long term cardiovascular and renal outcomes of uncontrolled blood pressure - Reviewed continuing to limit salt-containing foods to help with reduction of blood pressure  - Recommend to monitor blood pressure at least twice daily  - Counseled on getting up slowly from sitting/lying position to also help with signs of dizziness  given recent Metoprolol Succinate dose increase from 25mg  to 50mg  XL   Atrial Fibrillation: -  Currently uncontrolled - with planned cardioversion on 3/18 - Counseled on importance of amiodarone adherence and that her cardiologist will inform her of any medication changes that will need to be made after cardioversion procedure   Hyperlipidemia  - LDL goal <100 mg/dL - Counseled on importance of adherence to statin   Gout: - Uric acid goal <6 mg/dL - Counseled on importance of adherence to allopurinol for prevention of future gout flares     Follow Up Plan: Given no apparent interventions, informed patient to reach out to clinic if any additional questions or concerns    Verdene Rio, PharmD PGY1 Pharmacy Resident [facility], PharmD, BCPS, CPP Clinical Pharmacist Practitioner [facility] [phone]

## 2023-12-28 ENCOUNTER — Other Ambulatory Visit (INDEPENDENT_AMBULATORY_CARE_PROVIDER_SITE_OTHER): Payer: 59 | Admitting: Pharmacist

## 2023-12-28 DIAGNOSIS — N1832 Chronic kidney disease, stage 3b: Secondary | ICD-10-CM

## 2023-12-28 DIAGNOSIS — E1122 Type 2 diabetes mellitus with diabetic chronic kidney disease: Secondary | ICD-10-CM

## 2023-12-28 MED ORDER — TIRZEPATIDE 5 MG/0.5ML ~~LOC~~ SOAJ: 5.0000 mg | SUBCUTANEOUS | 0 refills | Status: AC

## 2023-12-28 NOTE — Patient Instructions (Addendum)
 It was a pleasure speaking with you today!  Your refill for mounjaro was sent to your pharmacy.  Continue to limit foods that are high in salt and try to check your blood pressure at least twice daily.   After your cardioversion appointment on 3/18, discuss with your cardiologist if changes to your medications are needed. Continue to take your amiodarone as prescribed.   Continue to take your simvastatin and allopurinol as prescribed.   Feel free to call with any questions or concerns!  Verdene Rio, PharmD PGY1 Pharmacy Resident  Arbutus Leas, PharmD, BCPS, CPP Clinical Pharmacist Practitioner [place] [facility] [phone]

## 2024-01-03 NOTE — Progress Notes (Signed)
 Called patient with pre-procedure instructions for tomorrow. Unable to leave voicemail due   Patient informed of:   Time to arrive for procedure. 0845 Remain NPO past midnight.  Must have a ride home and a responsible adult to remain with them for 24 hours post procedure.  Confirmed blood thinner. Pradaxa Confirmed no breaks in taking blood thinner for 3+ weeks prior to procedure. Confirmed patient stopped all GLP-1s and GLP-2s for at least one week before procedure.  Jardiance 01/02/24. Instructed to not start Mounjaro until after procedure

## 2024-01-04 ENCOUNTER — Encounter (HOSPITAL_COMMUNITY)
Admission: RE | Disposition: A | Discharge status: Home / Self Care | Payer: 59 | Source: Home / Self Care | Attending: Cardiology

## 2024-01-04 ENCOUNTER — Ambulatory Visit (HOSPITAL_COMMUNITY): Admitting: Certified Registered"

## 2024-01-04 ENCOUNTER — Encounter (HOSPITAL_COMMUNITY): Payer: Self-pay | Admitting: Cardiology

## 2024-01-04 ENCOUNTER — Ambulatory Visit (HOSPITAL_COMMUNITY)
Admission: RE | Admit: 2024-01-04 | Discharge: 2024-01-04 | Disposition: A | Discharge status: Home / Self Care | Payer: 59 | Attending: Cardiology | Admitting: Cardiology

## 2024-01-04 DIAGNOSIS — Z7901 Long term (current) use of anticoagulants: Secondary | ICD-10-CM | POA: Diagnosis not present

## 2024-01-04 DIAGNOSIS — I428 Other cardiomyopathies: Secondary | ICD-10-CM | POA: Insufficient documentation

## 2024-01-04 DIAGNOSIS — I4819 Other persistent atrial fibrillation: Secondary | ICD-10-CM | POA: Insufficient documentation

## 2024-01-04 DIAGNOSIS — E039 Hypothyroidism, unspecified: Secondary | ICD-10-CM | POA: Insufficient documentation

## 2024-01-04 DIAGNOSIS — I4891 Unspecified atrial fibrillation: Secondary | ICD-10-CM | POA: Diagnosis not present

## 2024-01-04 DIAGNOSIS — Z7984 Long term (current) use of oral hypoglycemic drugs: Secondary | ICD-10-CM | POA: Insufficient documentation

## 2024-01-04 DIAGNOSIS — I5032 Chronic diastolic (congestive) heart failure: Secondary | ICD-10-CM | POA: Insufficient documentation

## 2024-01-04 DIAGNOSIS — Z79899 Other long term (current) drug therapy: Secondary | ICD-10-CM | POA: Insufficient documentation

## 2024-01-04 DIAGNOSIS — D869 Sarcoidosis, unspecified: Secondary | ICD-10-CM | POA: Diagnosis not present

## 2024-01-04 DIAGNOSIS — Z6841 Body Mass Index (BMI) 40.0 and over, adult: Secondary | ICD-10-CM | POA: Insufficient documentation

## 2024-01-04 DIAGNOSIS — I130 Hypertensive heart and chronic kidney disease with heart failure and stage 1 through stage 4 chronic kidney disease, or unspecified chronic kidney disease: Secondary | ICD-10-CM | POA: Diagnosis not present

## 2024-01-04 DIAGNOSIS — E1122 Type 2 diabetes mellitus with diabetic chronic kidney disease: Secondary | ICD-10-CM | POA: Diagnosis not present

## 2024-01-04 DIAGNOSIS — I110 Hypertensive heart disease with heart failure: Secondary | ICD-10-CM | POA: Diagnosis not present

## 2024-01-04 DIAGNOSIS — E119 Type 2 diabetes mellitus without complications: Secondary | ICD-10-CM | POA: Insufficient documentation

## 2024-01-04 DIAGNOSIS — N1832 Chronic kidney disease, stage 3b: Secondary | ICD-10-CM | POA: Diagnosis not present

## 2024-01-04 HISTORY — PX: CARDIOVERSION: EP1203

## 2024-01-04 SURGERY — CARDIOVERSION (CATH LAB)
Anesthesia: General

## 2024-01-04 MED ORDER — PROPOFOL 10 MG/ML IV BOLUS
INTRAVENOUS | Status: DC | PRN
  Administered 2024-01-04: 40 mg via INTRAVENOUS
  Administered 2024-01-04: 20 mg via INTRAVENOUS

## 2024-01-04 MED ORDER — LIDOCAINE 2% (20 MG/ML) 5 ML SYRINGE
INTRAMUSCULAR | Status: DC | PRN
  Administered 2024-01-04: 50 mg via INTRAVENOUS

## 2024-01-04 SURGICAL SUPPLY — 1 items: PAD DEFIB RADIO PHYSIO CONN (PAD) ×1 IMPLANT

## 2024-01-04 NOTE — Interval H&P Note (Signed)
 History and Physical Interval Note:  01/04/2024 8:58 AM  Stephanie [name]  has presented today for surgery, with the diagnosis of AFIB.  The various methods of treatment have been discussed with the patient and family. After consideration of risks, benefits and other options for treatment, the patient has consented to  Procedure(s): CARDIOVERSION (N/A) as a surgical intervention.  The patient's history has been reviewed, patient examined, no change in status, stable for surgery.  I have reviewed the patient's chart and labs.  Questions were answered to the patient's satisfaction.     Zeven Kocak

## 2024-01-04 NOTE — CV Procedure (Signed)
   Electrical Cardioversion Procedure Note Hayleigh Bawa [id number] 1951/01/06  Procedure: Electrical Cardioversion Indications:  Atrial Fibrillation  Time Out: Verified patient identification, verified procedure,medications/allergies/relevent history reviewed, required imaging and test results available.  Performed  Procedure Details  The patient signed informed consent.   The patient was NPO past midnight. Has had therapeutic anticoagulation with Pradaxa greater than 3 weeks. The patient denies any interruption of anticoagulation.  Anesthesia was administered by Dr. Maple Hudson.  Adequate airway was maintained throughout and vital followed per protocol.  He was cardioverted x 1 with 200J of biphasic synchronized energy.  He converted to NSR.  There were no apparent complications.  The patient tolerated the procedure well and had normal neuro status and respiratory status post procedure with vitals stable as recorded elsewhere.     IMPRESSION:  Successful cardioversion of atrial fibrillation   Follow up:  We will arrange follow up with Primary Cardiologist.  He will continue on current medical therapy.  The patient advised to continue anticoagulation.  Khanh Cordner 01/04/2024, 10:43 AM

## 2024-01-04 NOTE — Transfer of Care (Signed)
 Immediate Anesthesia Transfer of Care Note  Patient: Stephanie [name]  Procedure(s) Performed: CARDIOVERSION  Patient Location: PACU and Cath Lab  Anesthesia Type:General  Level of Consciousness: drowsy, patient cooperative, and responds to stimulation  Airway & Oxygen Therapy: Patient Spontanous Breathing and Patient connected to nasal cannula oxygen  Post-op Assessment: Report given to RN and Post -op Vital signs reviewed and stable  Post vital signs: Reviewed and stable  Last Vitals:  Vitals Value Taken Time  BP    Temp    Pulse    Resp    SpO2      Last Pain:  Vitals:   01/04/24 0852  TempSrc: Temporal         Complications: No notable events documented.

## 2024-01-07 NOTE — Anesthesia Preprocedure Evaluation (Signed)
 Anesthesia Evaluation  Patient identified by MRN, date of birth, ID band Patient awake    Reviewed: Allergy & Precautions, NPO status , Patient's Chart, lab work & pertinent test results, reviewed documented beta blocker date and time   History of Anesthesia Complications (+) PONV and history of anesthetic complications  Airway Mallampati: II  TM Distance: >3 FB     Dental  (+) Teeth Intact, Dental Advisory Given   Pulmonary asthma  Hx/o sarcoidosis   breath sounds clear to auscultation       Cardiovascular hypertension, Pt. on medications and Pt. on home beta blockers +CHF  + dysrhythmias Atrial Fibrillation  Rhythm:Irregular  EKG 08/06/23 Atrial fibrillation with RVR  Echo 11/14/20 1. Left ventricular ejection fraction, by estimation, is 40 to 45%. The  left ventricle has mildly decreased function. Left ventricular endocardial  border not optimally defined to evaluate regional wall motion. There is  moderate left ventricular  hypertrophy. Left ventricular diastolic parameters are indeterminate.   2. Right ventricular systolic function was not well visualized. The right  ventricular size is normal. Tricuspid regurgitation signal is inadequate  for assessing PA pressure.   3. Left atrial size was mild to moderately dilated.   4. The mitral valve is normal in structure. No evidence of mitral valve  regurgitation. No evidence of mitral stenosis.   5. The aortic valve was not well visualized. Aortic valve regurgitation  is not visualized. Mild aortic valve sclerosis is present, with no  evidence of aortic valve stenosis.   6. The inferior vena cava is normal in size with greater than 50%  respiratory variability, suggesting right atrial pressure of 3 mmHg.   7. Technically difficult study with poor acoustic windows.     Neuro/Psych  PSYCHIATRIC DISORDERS Anxiety Depression    negative neurological ROS     GI/Hepatic ,,,(+)  Hepatitis -IBS Hx/o diverticulosis   Endo/Other  diabetes, Well Controlled, Type 2, Oral Hypoglycemic AgentsHypothyroidism  Class 3 obesityGLP-1 RA therapy Hyperlipidemia Gout  Renal/GU Renal InsufficiencyRenal disease  negative genitourinary   Musculoskeletal  (+) Arthritis , Osteoarthritis,    Abdominal  (+) + obese  Peds  Hematology  (+) Blood dyscrasia, anemia Eliquis therapy   Anesthesia Other Findings   Reproductive/Obstetrics                              Anesthesia Physical Anesthesia Plan  ASA: 3  Anesthesia Plan: General   Post-op Pain Management: Minimal or no pain anticipated   Induction: Intravenous  PONV Risk Score and Plan: 4 or greater and Treatment may vary due to age or medical condition, TIVA and Propofol infusion  Airway Management Planned: Natural Airway and Mask  Additional Equipment: None  Intra-op Plan:   Post-operative Plan:   Informed Consent: I have reviewed the patients History and Physical, chart, labs and discussed the procedure including the risks, benefits and alternatives for the proposed anesthesia with the patient or authorized representative who has indicated his/her understanding and acceptance.     Dental advisory given  Plan Discussed with: CRNA and Anesthesiologist  Anesthesia Plan Comments:         Anesthesia Quick Evaluation

## 2024-01-07 NOTE — Anesthesia Postprocedure Evaluation (Signed)
 Anesthesia Post Note  Patient: Stephanie [name]  Procedure(s) Performed: CARDIOVERSION     Patient location during evaluation: Cath Lab Anesthesia Type: General Level of consciousness: awake and alert Pain management: pain level controlled Vital Signs Assessment: post-procedure vital signs reviewed and stable Respiratory status: spontaneous breathing, nonlabored ventilation and respiratory function stable Cardiovascular status: blood pressure returned to baseline and stable Postop Assessment: no apparent nausea or vomiting Anesthetic complications: no   No notable events documented.  Last Vitals:  Vitals:   01/04/24 1020 01/04/24 1025  BP: 103/80 102/62  Pulse: (!) 50 (!) 53  Resp: 13 12  Temp:    SpO2: 96% 97%    Last Pain:  Vitals:   01/04/24 1025  TempSrc:   PainSc: 0-No pain                 Nanami Whitelaw

## 2024-01-13 ENCOUNTER — Ambulatory Visit: Payer: 59 | Attending: Internal Medicine

## 2024-01-13 DIAGNOSIS — I4819 Other persistent atrial fibrillation: Secondary | ICD-10-CM | POA: Diagnosis not present

## 2024-01-13 LAB — ECHOCARDIOGRAM COMPLETE
AR max vel: 2.44 cm2
AV Area VTI: 2.52 cm2
AV Area mean vel: 2.34 cm2
AV Mean grad: 4 mmHg
AV Peak grad: 7.2 mmHg
Ao pk vel: 1.34 m/s
Area-P 1/2: 4.06 cm2
S' Lateral: 3.2 cm

## 2024-01-25 ENCOUNTER — Telehealth: Payer: Self-pay

## 2024-01-25 NOTE — Telephone Encounter (Signed)
   Name: Stephanie [name]  DOB: 01-May-1951  MRN: [id number]  Primary Cardiologist: None  Chart reviewed as part of pre-operative protocol coverage. The patient has an upcoming visit scheduled with Dr. Graciela Husbands on 01/27/2024 at which time clearance can be addressed in case there are any issues that would impact surgical recommendations.  Colon surgery Is not scheduled until TBD as below. I added preop FYI to appointment note so that provider is aware to address at time of outpatient visit.  Per office protocol the cardiology provider should forward their finalized clearance decision and recommendations regarding antiplatelet therapy to the requesting party below.    This message will also be routed to pharmacy pool for input on holding Eliquis as requested below so that this information is available to the clearing provider at time of patient's appointment.   I will route this message as FYI to requesting party and remove this message from the preop box as separate preop APP input not needed at this time.   Please call with any questions.  Denyce Robert, NP  01/25/2024, 4:12 PM

## 2024-01-25 NOTE — Telephone Encounter (Signed)
   Pre-operative Risk Assessment    Patient Name: Stephanie [name]  DOB: 1951/01/27 MRN: [id number]   Date of last office visit: 12/16/23 Sherryl Manges, MD Date of next office visit: 01/27/24 TELEPHONE VISIT Sherryl Manges, MD   Request for Surgical Clearance    Procedure:   COLON SURGERY  Date of Surgery:  Clearance TBD                                Surgeon:  Romie Levee, MD Surgeon's Group or Practice Name:  [facility] Phone number:  [phone] Fax number:  [phone] ATTN: Michel Bickers, LPN   Type of Clearance Requested:   - Medical  - Pharmacy:  Hold Apixaban (Eliquis)     Type of Anesthesia:  General    Additional requests/questions:    SignedMarlow Baars   01/25/2024, 3:45 PM

## 2024-01-26 NOTE — Telephone Encounter (Signed)
 Patient with diagnosis of atrial fibrillation on Eliquis for anticoagulation.    Procedure:   COLON SURGERY   Date of Surgery:  Clearance TBD   CHA2DS2-VASc Score = 6   This indicates a 9.7% annual risk of stroke. The patient's score is based upon: CHF History: 1 HTN History: 1 Diabetes History: 1 Stroke History: 0 Vascular Disease History: 1 Age Score: 1 Gender Score: 1   CrCl 95 Platelet count 196  Per office protocol, patient can hold Eliquis for 2 days prior to procedure.   Patient will not need bridging with Lovenox (enoxaparin) around procedure.  **This guidance is not considered finalized until pre-operative APP has relayed final recommendations.**

## 2024-01-27 ENCOUNTER — Ambulatory Visit: Payer: 59 | Attending: Internal Medicine | Admitting: Internal Medicine

## 2024-01-27 ENCOUNTER — Encounter: Payer: Self-pay | Admitting: Internal Medicine

## 2024-01-27 VITALS — BP 144/78 | HR 61 | Ht 63.0 in

## 2024-01-27 DIAGNOSIS — I428 Other cardiomyopathies: Secondary | ICD-10-CM

## 2024-01-27 DIAGNOSIS — I4819 Other persistent atrial fibrillation: Secondary | ICD-10-CM

## 2024-01-27 DIAGNOSIS — I5032 Chronic diastolic (congestive) heart failure: Secondary | ICD-10-CM

## 2024-01-27 NOTE — Progress Notes (Signed)
 Electrophysiology TeleHealth Note      Date:  01/27/2024   ID:  Stephanie [name], DOB [birth year], MRN [id number]  Location: patient's home  Provider location: [address] [place]  Evaluation Performed: Follow-up visit  PCP:  [name], MD  Cardiologist:    Electrophysiologist:  SK   Chief Complaint:  atrial fibrillation   History of Present Illness:   My location is [facility]. The Patients location is their home. Stephanie [name] is a 73 y.o. female who presents via audio conferencing for a telehealth visit today.  Since last being seen in our clinic for atrial fibrillation a rate related cardiomyopathy with interval normalization with recurrent cardiomyopathy and subsequent improvement again on GDMT and last seen with a persistent atrial fibrillation for which is taking amiodarone and for which she underwent cardioversion 3/18 and a follow-up echo 3/27 showing an ejection fraction of 45-50% the patient reports feeling better    The patient denies symptoms of fevers, chills, cough, or new SOB worrisome for COVID 19.   Past Medical History:  Diagnosis Date   Anemia    Ankle fracture, right    x 2   Antineoplastic and immunosuppressive drugs causing adverse effect in therapeutic use    Asthma    Chronic venous hypertension without complications    Colon polyps 2009   Coronary artery disease    no CAD by cath 11.2010   Depressive disorder, not elsewhere classified    Diabetes mellitus type 2 in obese    hgb AIC 6.1 09/02/2009, insulin dependent    Diastolic CHF, chronic (HCC)    reports stable   Diverticulosis of colon 2009   colonoscopy   Gout    cortisone  shots helped   Hepatitis, unspecified    hx of/per pt, never was told of having hepatitis   Hx of hysterectomy 2002   Hyperlipidemia    Hypertension    controlled on medication    Hypothyroidism    reports her thryoid levels are normal now    Irritable bowel syndrome     Kidney disease    Lung nodule    12mm RLL nodule on CT Chest  07/2009, resolved by 2011 CT   Noncompliance    Obesity    Persistent atrial fibrillation (HCC)    s/p DCCV 07/2009; Pradaxa Rx, states she is in sinus rhythm now    PONV (postoperative nausea and vomiting)    after receiving "too much anesthesia" after a hernia surgery    Rapid atrial fibrillation (HCC) 10/06/2023   Sarcoidosis    dx by eye exam; seen during eye exam , report asymtptomatic "i dont have it now'    Sleep disturbance 06/2013   sleep study, mild abnormality, no criteria for CPAP    Past Surgical History:  Procedure Laterality Date   ABDOMINAL HYSTERECTOMY     CARDIOVERSION     x 2   CARDIOVERSION N/A 02/27/2013   Procedure: CARDIOVERSION;  Surgeon: Lewayne Bunting, MD;  Location: [facility] ENDOSCOPY;  Service: Cardiovascular;  Laterality: N/A;   CARDIOVERSION N/A 03/24/2017   Procedure: CARDIOVERSION;  Surgeon: Wendall Stade, MD;  Location: [facility] ENDOSCOPY;  Service: Cardiovascular;  Laterality: N/A;   CARDIOVERSION N/A 03/04/2020   Procedure: CARDIOVERSION;  Surgeon: Wendall Stade, MD;  Location: [facility] ENDOSCOPY;  Service: Cardiovascular;  Laterality: N/A;   CARDIOVERSION N/A 06/04/2023   Procedure: CARDIOVERSION;  Surgeon: Sande Rives, MD;  Location: [facility] INVASIVE CV LAB;  Service: Cardiovascular;  Laterality: N/A;   CARDIOVERSION N/A 08/16/2023   Procedure: CARDIOVERSION;  Surgeon: Chrystie Nose, MD;  Location: [facility] INVASIVE CV LAB;  Service: Cardiovascular;  Laterality: N/A;   CARDIOVERSION N/A 01/04/2024   Procedure: CARDIOVERSION;  Surgeon: Thomasene Ripple, DO;  Location: [facility] INVASIVE CV LAB;  Service: Cardiovascular;  Laterality: N/A;   CHOLECYSTECTOMY  early 80s   COLONOSCOPY     Diverticulosis, colon polyps, repeat 2014, Dr. Arlyce Dice   COLONOSCOPY WITH PROPOFOL N/A 07/25/2019   Procedure: COLONOSCOPY WITH PROPOFOL;  Surgeon: Benancio Deeds, MD;  Location: [facility] ENDOSCOPY;  Service: Gastroenterology;   Laterality: N/A;   ESOPHAGOGASTRODUODENOSCOPY N/A 05/16/2014   Procedure: ESOPHAGOGASTRODUODENOSCOPY (EGD);  Surgeon: Graylin Shiver, MD;  Location: [facility] ORS;  Service: Gastroenterology;  Laterality: N/A;   HERNIA REPAIR  2009   umbilical hernia   MYOMECTOMY  2000   POLYPECTOMY  07/25/2019   Procedure: POLYPECTOMY;  Surgeon: Benancio Deeds, MD;  Location: [facility] ENDOSCOPY;  Service: Gastroenterology;;    Current Outpatient Medications  Medication Sig Dispense Refill   Accu-Chek FastClix Lancets MISC      allopurinol (ZYLOPRIM) 100 MG tablet TAKE 1/2 TABLET(50 MG) BY MOUTH TWICE DAILY (Patient taking differently: Take 100 mg by mouth daily.) 90 tablet 3   ALPRAZolam (XANAX) 0.5 MG tablet Take 1 tablet (0.5 mg total) by mouth 3 (three) times daily as needed for anxiety. 270 tablet 0   amiodarone (PACERONE) 200 MG tablet Take 1 tablet (200 mg total) by mouth 2 (two) times daily for 30 days, THEN 1 tablet (200 mg total) daily. (Patient taking differently: Take 1 tablet (200 mg total) Sometimes take and additional 200 mg if needed) 90 tablet 3   apixaban (ELIQUIS) 5 MG TABS tablet Take 5 mg by mouth 2 (two) times daily.     Blood Pressure KIT 1 kit by Does not apply route daily. Check blood pressure once a day 1 kit 0   colchicine 0.6 MG tablet Take 1 tablet (0.6 mg total) by mouth daily. 90 tablet 0   furosemide (LASIX) 40 MG tablet Take 1 tablet (40 mg total) by mouth daily. 90 tablet 1   ibuprofen (ADVIL) 200 MG tablet Take 400 mg by mouth daily as needed for moderate pain (pain score 4-6) or mild pain (pain score 1-3).     JARDIANCE 25 MG TABS tablet TAKE 1 TABLET(25 MG) BY MOUTH DAILY 90 tablet 1   KERENDIA 20 MG TABS TAKE 1 TABLET(20 MG) BY MOUTH DAILY 90 tablet 0   metoprolol succinate (TOPROL-XL) 50 MG 24 hr tablet Take 1 tablet (50 mg total) by mouth daily. 90 tablet 3   polyethylene glycol (MIRALAX / GLYCOLAX) 17 g packet Take 17 g by mouth 2 (two) times daily. Titrate as needed 60 packet 3    sacubitril-valsartan (ENTRESTO) 24-26 MG TAKE 1 TABLET BY MOUTH TWICE DAILY 180 tablet 3   simvastatin (ZOCOR) 10 MG tablet Take 1 tablet (10 mg total) by mouth at bedtime. 90 tablet 0   terbinafine (LAMISIL) 1 % cream Apply 1 Application topically 2 (two) times daily as needed (skin irritation/itching.).     tirzepatide (MOUNJARO) 5 MG/0.5ML Pen Inject 5 mg into the skin once a week. 6 mL 0   dabigatran (PRADAXA) 150 MG CAPS capsule Take 1 capsule (150 mg total) by mouth 2 (two) times daily. (Patient not taking: Reported on 12/31/2023) 60 capsule 3   neomycin-colistin-hydrocortisone-thonzonium (CORTISPORIN-TC) 3.12-19-08-0.5 MG/ML OTIC suspension Place 3 drops into the left  ear 3 (three) times daily. (Patient not taking: Reported on 12/31/2023) 10 mL 1   No current facility-administered medications for this visit.    Allergies:   Amiodarone, Aspirin, Methimazole, Propoxyphene, Trintellix [vortioxetine], Fentanyl, Albuterol, Lipitor [atorvastatin], and Livalo [pitavastatin]   ROS:  Please see the history of present illness.   All other systems are personally reviewed and negative.    Exam:    Vital Signs:  BP (!) 144/78   Pulse 61   Ht 5\' 3"  (1.6 m)   BMI 44.99 kg/m        Labs/Other Tests and Data Reviewed:    Recent Labs: 10/06/2023: Magnesium 2.3 12/02/2023: TSH 2.45 12/16/2023: ALT 9; BUN 19; Creatinine, Ser 1.13; Hemoglobin 11.7; Platelets 196; Potassium 3.6; Sodium 144   Wt Readings from Last 3 Encounters:  01/04/24 254 lb (115.2 kg)  12/16/23 247 lb 9.6 oz (112.3 kg)  12/02/23 247 lb (112 kg)     Other studies personally reviewed: Additional studies/ records that were reviewed today include:   Review of the above records today demonstrates:  Prior radiographs:    ASSESSMENT & PLAN:    Hypertension     Atrial fibrillation-persistent-recurrent increasing-   Cardiomyopathy non ischemic resolved intercurrently   Congestive heart failure  chronic class III    Sarcoid   Obesity - morbid            Follow-up: EP APP 2 month   Current medicines are reviewed at length with the patient today.   The patient has concerns regarding her medicines.  The following changes were made today: Decrease the amiodarone to 200 mg a day from 400 mg a day  Labs/ tests ordered today include: none No orders of the defined types were placed in this encounter.      Today, I have spent  13 minutes with the patient with telehealth technology discussing the above.  Signed, Sherryl Manges, MD  01/27/2024 7:10 PM     [facility] [address] [place] [address] [phone] (office) [phone] (fax)

## 2024-02-21 ENCOUNTER — Other Ambulatory Visit (HOSPITAL_COMMUNITY): Payer: Self-pay

## 2024-02-21 ENCOUNTER — Telehealth: Payer: Self-pay | Admitting: Pharmacy Technician

## 2024-02-21 NOTE — Telephone Encounter (Signed)
 Pharmacy Patient Advocate Encounter  Received notification from [facility] that Prior Authorization for dabigatran  has been APPROVED from 02/21/24 to 10/18/24   PA #/Case ID/Reference #: [id number]

## 2024-02-21 NOTE — Telephone Encounter (Signed)
 Pharmacy Patient Advocate Encounter   Received notification from Fax that prior authorization for dabigatran  is required/requested.   Insurance verification completed.   The patient is insured through [employer] .   Per test claim: PA required; PA submitted to above mentioned insurance via CoverMyMeds Key/confirmation #/EOC [id number] Status is pending

## 2024-03-04 ENCOUNTER — Other Ambulatory Visit: Payer: Self-pay | Admitting: Internal Medicine

## 2024-03-04 DIAGNOSIS — E119 Type 2 diabetes mellitus without complications: Secondary | ICD-10-CM

## 2024-03-08 ENCOUNTER — Other Ambulatory Visit: Payer: Self-pay | Admitting: Internal Medicine

## 2024-03-08 DIAGNOSIS — E785 Hyperlipidemia, unspecified: Secondary | ICD-10-CM

## 2024-03-09 DIAGNOSIS — H2513 Age-related nuclear cataract, bilateral: Secondary | ICD-10-CM | POA: Diagnosis not present

## 2024-03-09 DIAGNOSIS — H1132 Conjunctival hemorrhage, left eye: Secondary | ICD-10-CM | POA: Diagnosis not present

## 2024-03-31 ENCOUNTER — Telehealth: Payer: Self-pay | Admitting: *Deleted

## 2024-03-31 NOTE — Telephone Encounter (Signed)
   Pre-operative Risk Assessment    Patient Name: Stephanie [name]  DOB: [birth year] MRN: [id number]   Date of last office visit: 01/27/24 TELEVISIT DR. KLEIN Date of next office visit: NONE   Request for Surgical Clearance    Procedure:  COLON SURGERY PER CLEARANCE FORM  Date of Surgery:  Clearance TBD                                Surgeon:  DR. Joyce Nixon Surgeon's Group or Practice Name:  [facility] Phone number:  [phone] Fax number:  [phone] [name], LPN   Type of Clearance Requested:   - Medical  - Pharmacy:  Hold Apixaban  (Eliquis )     Type of Anesthesia:  General    Additional requests/questions:    [name]   03/31/2024, 2:01 PM

## 2024-04-04 NOTE — Telephone Encounter (Signed)
 Patient with diagnosis of Afib on Pradaxa  for anticoagulation.    Procedure:  COLON SURGERY  Date of procedure: TBD   CHA2DS2-VASc Score = 6   This indicates a 9.7% annual risk of stroke. The patient's score is based upon: CHF History: 1 HTN History: 1 Diabetes History: 1 Stroke History: 0 Vascular Disease History: 1 Age Score: 1 Gender Score: 1   CrCl 95 mL/min Platelet count 196   Patient has not  had an Afib/aflutter ablation within the last 3 months or DCCV within the last 30 days   Per office protocol, patient can hold Pradaxa  for 2 days prior to procedure.   Patient will not need bridging with Lovenox (enoxaparin) around procedure.  **This guidance is not considered finalized until pre-operative APP has relayed final recommendations.**

## 2024-04-04 NOTE — Telephone Encounter (Signed)
 I s/w the pt and she said she is hesitant about scheduling her surgery and thinks she may get a 2nd opinion. Pt said we can disregard this request for now. I asked if she does change her mind with the surgery with Dr. Andy Bannister or a another surgery to let our office. If another surgeon doing surgery we will need a new request. Pt thanked me for my call and help today.

## 2024-04-04 NOTE — Telephone Encounter (Signed)
   Name: Stephanie [name]  DOB: 12/28/1950  MRN: [id number]  Primary Cardiologist: None   Preoperative team, please contact this patient and set up a phone call appointment for further preoperative risk assessment. Please obtain consent and complete medication review. Thank you for your help.  I confirm that guidance regarding antiplatelet and oral anticoagulation therapy has been completed and, if necessary, noted below.  Per Pharm D, patient has not had an Afib/aflutter ablation within the last 3 months or DCCV within the last 30 days. Patient may hold Pradaxa  for 2 days prior to procedure.   Patient will not need bridging with Lovenox (enoxaparin) around procedure.  I also confirmed the patient resides in the state of [place] . As per [facility] Medical Board telemedicine laws, the patient must reside in the state in which the provider is licensed.   Morey Ar, NP 04/04/2024, 1:00 PM [facility]

## 2024-04-25 ENCOUNTER — Encounter (HOSPITAL_COMMUNITY): Payer: Self-pay

## 2024-04-25 ENCOUNTER — Other Ambulatory Visit: Payer: Self-pay

## 2024-04-25 ENCOUNTER — Inpatient Hospital Stay (HOSPITAL_COMMUNITY)
Admission: EM | Admit: 2024-04-25 | Discharge: 2024-05-01 | DRG: 393 | Disposition: A | Discharge status: Home / Self Care | Attending: Internal Medicine | Admitting: Internal Medicine

## 2024-04-25 DIAGNOSIS — I480 Paroxysmal atrial fibrillation: Secondary | ICD-10-CM | POA: Diagnosis present

## 2024-04-25 DIAGNOSIS — F411 Generalized anxiety disorder: Secondary | ICD-10-CM | POA: Diagnosis present

## 2024-04-25 DIAGNOSIS — I4892 Unspecified atrial flutter: Secondary | ICD-10-CM | POA: Diagnosis present

## 2024-04-25 DIAGNOSIS — I10 Essential (primary) hypertension: Secondary | ICD-10-CM | POA: Diagnosis not present

## 2024-04-25 DIAGNOSIS — K5730 Diverticulosis of large intestine without perforation or abscess without bleeding: Secondary | ICD-10-CM | POA: Diagnosis not present

## 2024-04-25 DIAGNOSIS — K651 Peritoneal abscess: Secondary | ICD-10-CM | POA: Diagnosis present

## 2024-04-25 DIAGNOSIS — D631 Anemia in chronic kidney disease: Secondary | ICD-10-CM | POA: Diagnosis not present

## 2024-04-25 DIAGNOSIS — K5792 Diverticulitis of intestine, part unspecified, without perforation or abscess without bleeding: Secondary | ICD-10-CM | POA: Diagnosis present

## 2024-04-25 DIAGNOSIS — E876 Hypokalemia: Secondary | ICD-10-CM | POA: Diagnosis not present

## 2024-04-25 DIAGNOSIS — Z9049 Acquired absence of other specified parts of digestive tract: Secondary | ICD-10-CM

## 2024-04-25 DIAGNOSIS — K5720 Diverticulitis of large intestine with perforation and abscess without bleeding: Principal | ICD-10-CM | POA: Diagnosis present

## 2024-04-25 DIAGNOSIS — E785 Hyperlipidemia, unspecified: Secondary | ICD-10-CM | POA: Diagnosis not present

## 2024-04-25 DIAGNOSIS — R1030 Lower abdominal pain, unspecified: Secondary | ICD-10-CM | POA: Diagnosis not present

## 2024-04-25 DIAGNOSIS — M109 Gout, unspecified: Secondary | ICD-10-CM | POA: Diagnosis not present

## 2024-04-25 DIAGNOSIS — I4819 Other persistent atrial fibrillation: Secondary | ICD-10-CM | POA: Diagnosis not present

## 2024-04-25 DIAGNOSIS — Z6841 Body Mass Index (BMI) 40.0 and over, adult: Secondary | ICD-10-CM

## 2024-04-25 DIAGNOSIS — N390 Urinary tract infection, site not specified: Secondary | ICD-10-CM | POA: Diagnosis not present

## 2024-04-25 DIAGNOSIS — Z7985 Long-term (current) use of injectable non-insulin antidiabetic drugs: Secondary | ICD-10-CM

## 2024-04-25 DIAGNOSIS — K611 Rectal abscess: Principal | ICD-10-CM

## 2024-04-25 DIAGNOSIS — K219 Gastro-esophageal reflux disease without esophagitis: Secondary | ICD-10-CM | POA: Diagnosis present

## 2024-04-25 DIAGNOSIS — Z886 Allergy status to analgesic agent status: Secondary | ICD-10-CM

## 2024-04-25 DIAGNOSIS — I2510 Atherosclerotic heart disease of native coronary artery without angina pectoris: Secondary | ICD-10-CM | POA: Diagnosis not present

## 2024-04-25 DIAGNOSIS — Z8679 Personal history of other diseases of the circulatory system: Secondary | ICD-10-CM | POA: Diagnosis not present

## 2024-04-25 DIAGNOSIS — Z885 Allergy status to narcotic agent status: Secondary | ICD-10-CM

## 2024-04-25 DIAGNOSIS — I5022 Chronic systolic (congestive) heart failure: Secondary | ICD-10-CM | POA: Diagnosis not present

## 2024-04-25 DIAGNOSIS — Z7984 Long term (current) use of oral hypoglycemic drugs: Secondary | ICD-10-CM | POA: Diagnosis not present

## 2024-04-25 DIAGNOSIS — N179 Acute kidney failure, unspecified: Secondary | ICD-10-CM | POA: Diagnosis not present

## 2024-04-25 DIAGNOSIS — Z888 Allergy status to other drugs, medicaments and biological substances status: Secondary | ICD-10-CM

## 2024-04-25 DIAGNOSIS — R9431 Abnormal electrocardiogram [ECG] [EKG]: Secondary | ICD-10-CM | POA: Insufficient documentation

## 2024-04-25 DIAGNOSIS — Z7901 Long term (current) use of anticoagulants: Secondary | ICD-10-CM

## 2024-04-25 DIAGNOSIS — E1122 Type 2 diabetes mellitus with diabetic chronic kidney disease: Secondary | ICD-10-CM | POA: Diagnosis not present

## 2024-04-25 DIAGNOSIS — Z79899 Other long term (current) drug therapy: Secondary | ICD-10-CM

## 2024-04-25 DIAGNOSIS — E66813 Obesity, class 3: Secondary | ICD-10-CM | POA: Diagnosis present

## 2024-04-25 DIAGNOSIS — Z820 Family history of epilepsy and other diseases of the nervous system: Secondary | ICD-10-CM

## 2024-04-25 DIAGNOSIS — E039 Hypothyroidism, unspecified: Secondary | ICD-10-CM | POA: Diagnosis not present

## 2024-04-25 DIAGNOSIS — I130 Hypertensive heart and chronic kidney disease with heart failure and stage 1 through stage 4 chronic kidney disease, or unspecified chronic kidney disease: Secondary | ICD-10-CM | POA: Diagnosis present

## 2024-04-25 DIAGNOSIS — Z8249 Family history of ischemic heart disease and other diseases of the circulatory system: Secondary | ICD-10-CM

## 2024-04-25 DIAGNOSIS — N1831 Chronic kidney disease, stage 3a: Secondary | ICD-10-CM | POA: Diagnosis not present

## 2024-04-25 DIAGNOSIS — E119 Type 2 diabetes mellitus without complications: Secondary | ICD-10-CM | POA: Diagnosis not present

## 2024-04-25 DIAGNOSIS — Z90710 Acquired absence of both cervix and uterus: Secondary | ICD-10-CM

## 2024-04-25 DIAGNOSIS — M1000 Idiopathic gout, unspecified site: Secondary | ICD-10-CM | POA: Diagnosis not present

## 2024-04-25 DIAGNOSIS — Z825 Family history of asthma and other chronic lower respiratory diseases: Secondary | ICD-10-CM

## 2024-04-25 LAB — COMPREHENSIVE METABOLIC PANEL WITH GFR
ALT: 11 U/L (ref 0–44)
AST: 14 U/L — ABNORMAL LOW (ref 15–41)
Albumin: 3.8 g/dL (ref 3.5–5.0)
Alkaline Phosphatase: 93 U/L (ref 38–126)
Anion gap: 13 (ref 5–15)
BUN: 21 mg/dL (ref 8–23)
CO2: 25 mmol/L (ref 22–32)
Calcium: 9.5 mg/dL (ref 8.9–10.3)
Chloride: 103 mmol/L (ref 98–111)
Creatinine, Ser: 1.03 mg/dL — ABNORMAL HIGH (ref 0.44–1.00)
GFR, Estimated: 58 mL/min — ABNORMAL LOW (ref >60)
Glucose, Bld: 174 mg/dL — ABNORMAL HIGH (ref 70–99)
Potassium: 3.4 mmol/L — ABNORMAL LOW (ref 3.5–5.1)
Sodium: 141 mmol/L (ref 135–145)
Total Bilirubin: 0.6 mg/dL (ref 0.0–1.2)
Total Protein: 8 g/dL (ref 6.5–8.1)

## 2024-04-25 LAB — URINALYSIS, ROUTINE W REFLEX MICROSCOPIC
Bilirubin Urine: NEGATIVE
Glucose, UA: >=500 mg/dL — AB
Hgb urine dipstick: NEGATIVE
Ketones, ur: NEGATIVE mg/dL
Nitrite: NEGATIVE
Protein, ur: NEGATIVE mg/dL
Specific Gravity, Urine: 1.035 — ABNORMAL HIGH (ref 1.005–1.030)
pH: 5 (ref 5.0–8.0)

## 2024-04-25 LAB — CBC
HCT: 35.5 % — ABNORMAL LOW (ref 36.0–46.0)
Hemoglobin: 11.2 g/dL — ABNORMAL LOW (ref 12.0–15.0)
MCH: 30.1 pg (ref 26.0–34.0)
MCHC: 31.5 g/dL (ref 30.0–36.0)
MCV: 95.4 fL (ref 80.0–100.0)
Platelets: 229 K/uL (ref 150–400)
RBC: 3.72 MIL/uL — ABNORMAL LOW (ref 3.87–5.11)
RDW: 13.1 % (ref 11.5–15.5)
WBC: 8.1 K/uL (ref 4.0–10.5)
nRBC: 0 % (ref 0.0–0.2)

## 2024-04-25 LAB — LIPASE, BLOOD: Lipase: 32 U/L (ref 11–51)

## 2024-04-25 NOTE — ED Triage Notes (Signed)
 Complaining of pain in the abdomen that started 2 days ago thought that it would go away but has gotten worse. She thinks her diverticulitis is acting up. She is also complaining of pain in the right index finger. It is swollen and the joints hurt to move.

## 2024-04-26 ENCOUNTER — Encounter (HOSPITAL_COMMUNITY): Payer: Self-pay | Admitting: Internal Medicine

## 2024-04-26 ENCOUNTER — Inpatient Hospital Stay (HOSPITAL_COMMUNITY)

## 2024-04-26 ENCOUNTER — Emergency Department (HOSPITAL_COMMUNITY)

## 2024-04-26 DIAGNOSIS — E119 Type 2 diabetes mellitus without complications: Secondary | ICD-10-CM

## 2024-04-26 DIAGNOSIS — K219 Gastro-esophageal reflux disease without esophagitis: Secondary | ICD-10-CM | POA: Diagnosis present

## 2024-04-26 DIAGNOSIS — I480 Paroxysmal atrial fibrillation: Secondary | ICD-10-CM | POA: Diagnosis present

## 2024-04-26 DIAGNOSIS — I5022 Chronic systolic (congestive) heart failure: Secondary | ICD-10-CM | POA: Diagnosis present

## 2024-04-26 DIAGNOSIS — N179 Acute kidney failure, unspecified: Secondary | ICD-10-CM | POA: Diagnosis present

## 2024-04-26 DIAGNOSIS — Z6841 Body Mass Index (BMI) 40.0 and over, adult: Secondary | ICD-10-CM | POA: Diagnosis not present

## 2024-04-26 DIAGNOSIS — N390 Urinary tract infection, site not specified: Secondary | ICD-10-CM | POA: Diagnosis present

## 2024-04-26 DIAGNOSIS — K651 Peritoneal abscess: Secondary | ICD-10-CM | POA: Diagnosis present

## 2024-04-26 DIAGNOSIS — E876 Hypokalemia: Secondary | ICD-10-CM | POA: Diagnosis present

## 2024-04-26 DIAGNOSIS — E66813 Obesity, class 3: Secondary | ICD-10-CM | POA: Diagnosis present

## 2024-04-26 DIAGNOSIS — Z9049 Acquired absence of other specified parts of digestive tract: Secondary | ICD-10-CM | POA: Diagnosis not present

## 2024-04-26 DIAGNOSIS — I10 Essential (primary) hypertension: Secondary | ICD-10-CM | POA: Diagnosis not present

## 2024-04-26 DIAGNOSIS — K5730 Diverticulosis of large intestine without perforation or abscess without bleeding: Secondary | ICD-10-CM | POA: Diagnosis not present

## 2024-04-26 DIAGNOSIS — K5792 Diverticulitis of intestine, part unspecified, without perforation or abscess without bleeding: Secondary | ICD-10-CM | POA: Diagnosis not present

## 2024-04-26 DIAGNOSIS — I130 Hypertensive heart and chronic kidney disease with heart failure and stage 1 through stage 4 chronic kidney disease, or unspecified chronic kidney disease: Secondary | ICD-10-CM | POA: Diagnosis present

## 2024-04-26 DIAGNOSIS — Z7901 Long term (current) use of anticoagulants: Secondary | ICD-10-CM | POA: Diagnosis not present

## 2024-04-26 DIAGNOSIS — N1831 Chronic kidney disease, stage 3a: Secondary | ICD-10-CM | POA: Diagnosis present

## 2024-04-26 DIAGNOSIS — E1122 Type 2 diabetes mellitus with diabetic chronic kidney disease: Secondary | ICD-10-CM | POA: Diagnosis present

## 2024-04-26 DIAGNOSIS — K611 Rectal abscess: Secondary | ICD-10-CM | POA: Diagnosis present

## 2024-04-26 DIAGNOSIS — F411 Generalized anxiety disorder: Secondary | ICD-10-CM | POA: Diagnosis present

## 2024-04-26 DIAGNOSIS — Z8679 Personal history of other diseases of the circulatory system: Secondary | ICD-10-CM

## 2024-04-26 DIAGNOSIS — D631 Anemia in chronic kidney disease: Secondary | ICD-10-CM | POA: Diagnosis present

## 2024-04-26 DIAGNOSIS — R9431 Abnormal electrocardiogram [ECG] [EKG]: Secondary | ICD-10-CM | POA: Insufficient documentation

## 2024-04-26 DIAGNOSIS — N3090 Cystitis, unspecified without hematuria: Secondary | ICD-10-CM | POA: Diagnosis not present

## 2024-04-26 DIAGNOSIS — I4819 Other persistent atrial fibrillation: Secondary | ICD-10-CM | POA: Diagnosis present

## 2024-04-26 DIAGNOSIS — K5720 Diverticulitis of large intestine with perforation and abscess without bleeding: Secondary | ICD-10-CM | POA: Diagnosis present

## 2024-04-26 DIAGNOSIS — R1032 Left lower quadrant pain: Secondary | ICD-10-CM | POA: Diagnosis not present

## 2024-04-26 DIAGNOSIS — I2510 Atherosclerotic heart disease of native coronary artery without angina pectoris: Secondary | ICD-10-CM | POA: Diagnosis present

## 2024-04-26 DIAGNOSIS — Z7984 Long term (current) use of oral hypoglycemic drugs: Secondary | ICD-10-CM | POA: Diagnosis not present

## 2024-04-26 DIAGNOSIS — M109 Gout, unspecified: Secondary | ICD-10-CM | POA: Diagnosis present

## 2024-04-26 DIAGNOSIS — E785 Hyperlipidemia, unspecified: Secondary | ICD-10-CM | POA: Diagnosis present

## 2024-04-26 DIAGNOSIS — M1000 Idiopathic gout, unspecified site: Secondary | ICD-10-CM | POA: Diagnosis not present

## 2024-04-26 DIAGNOSIS — R1030 Lower abdominal pain, unspecified: Secondary | ICD-10-CM | POA: Diagnosis present

## 2024-04-26 DIAGNOSIS — I4892 Unspecified atrial flutter: Secondary | ICD-10-CM | POA: Diagnosis present

## 2024-04-26 DIAGNOSIS — M7989 Other specified soft tissue disorders: Secondary | ICD-10-CM | POA: Diagnosis not present

## 2024-04-26 DIAGNOSIS — E039 Hypothyroidism, unspecified: Secondary | ICD-10-CM | POA: Diagnosis present

## 2024-04-26 LAB — HEPARIN LEVEL (UNFRACTIONATED)
Heparin Unfractionated: 0.24 [IU]/mL — ABNORMAL LOW (ref 0.30–0.70)
Heparin Unfractionated: 0.53 [IU]/mL (ref 0.30–0.70)

## 2024-04-26 LAB — COMPREHENSIVE METABOLIC PANEL WITH GFR
ALT: 8 U/L (ref 0–44)
AST: 13 U/L — ABNORMAL LOW (ref 15–41)
Albumin: 3.3 g/dL — ABNORMAL LOW (ref 3.5–5.0)
Alkaline Phosphatase: 78 U/L (ref 38–126)
Anion gap: 10 (ref 5–15)
BUN: 19 mg/dL (ref 8–23)
CO2: 23 mmol/L (ref 22–32)
Calcium: 8.7 mg/dL — ABNORMAL LOW (ref 8.9–10.3)
Chloride: 105 mmol/L (ref 98–111)
Creatinine, Ser: 1.07 mg/dL — ABNORMAL HIGH (ref 0.44–1.00)
GFR, Estimated: 56 mL/min — ABNORMAL LOW (ref >60)
Glucose, Bld: 129 mg/dL — ABNORMAL HIGH (ref 70–99)
Potassium: 3.2 mmol/L — ABNORMAL LOW (ref 3.5–5.1)
Sodium: 138 mmol/L (ref 135–145)
Total Bilirubin: 0.7 mg/dL (ref 0.0–1.2)
Total Protein: 7.1 g/dL (ref 6.5–8.1)

## 2024-04-26 LAB — PROTIME-INR
INR: 1.1 (ref 0.8–1.2)
Prothrombin Time: 14.4 s (ref 11.4–15.2)

## 2024-04-26 LAB — URIC ACID: Uric Acid, Serum: 4.1 mg/dL (ref 2.5–7.1)

## 2024-04-26 LAB — CBG MONITORING, ED
Glucose-Capillary: 130 mg/dL — ABNORMAL HIGH (ref 70–99)
Glucose-Capillary: 107 mg/dL — ABNORMAL HIGH (ref 70–99)
Glucose-Capillary: 112 mg/dL — ABNORMAL HIGH (ref 70–99)

## 2024-04-26 LAB — CBC
HCT: 31.6 % — ABNORMAL LOW (ref 36.0–46.0)
Hemoglobin: 10 g/dL — ABNORMAL LOW (ref 12.0–15.0)
MCH: 29.9 pg (ref 26.0–34.0)
MCHC: 31.6 g/dL (ref 30.0–36.0)
MCV: 94.6 fL (ref 80.0–100.0)
Platelets: 203 K/uL (ref 150–400)
RBC: 3.34 MIL/uL — ABNORMAL LOW (ref 3.87–5.11)
RDW: 13.1 % (ref 11.5–15.5)
WBC: 7.9 K/uL (ref 4.0–10.5)
nRBC: 0 % (ref 0.0–0.2)

## 2024-04-26 LAB — GLUCOSE, CAPILLARY: Glucose-Capillary: 94 mg/dL (ref 70–99)

## 2024-04-26 MED ORDER — INSULIN ASPART 100 UNIT/ML IJ SOLN
0.0000 [IU] | INTRAMUSCULAR | Status: DC
  Filled 2024-04-26: qty 0.09

## 2024-04-26 MED ORDER — TRIMETHOBENZAMIDE HCL 100 MG/ML IM SOLN: 200.0000 mg | Freq: Three times a day (TID) | INTRAMUSCULAR | Status: DC | PRN

## 2024-04-26 MED ORDER — COLCHICINE 0.6 MG PO TABS
0.6000 mg | ORAL_TABLET | Freq: Every day | ORAL | Status: DC
  Administered 2024-04-27 – 2024-05-01 (×5): 0.6 mg via ORAL
  Filled 2024-04-26 (×5): qty 1

## 2024-04-26 MED ORDER — METOPROLOL SUCCINATE ER 50 MG PO TB24
50.0000 mg | ORAL_TABLET | Freq: Every day | ORAL | Status: DC
  Administered 2024-04-26 – 2024-05-01 (×6): 50 mg via ORAL
  Filled 2024-04-26 (×7): qty 1

## 2024-04-26 MED ORDER — PANTOPRAZOLE SODIUM 40 MG PO TBEC
40.0000 mg | DELAYED_RELEASE_TABLET | Freq: Two times a day (BID) | ORAL | Status: DC
  Administered 2024-04-26 – 2024-05-01 (×11): 40 mg via ORAL
  Filled 2024-04-26 (×11): qty 1

## 2024-04-26 MED ORDER — POTASSIUM CHLORIDE CRYS ER 20 MEQ PO TBCR
20.0000 meq | EXTENDED_RELEASE_TABLET | Freq: Once | ORAL | Status: AC
  Administered 2024-04-26: 20 meq via ORAL
  Filled 2024-04-26: qty 1

## 2024-04-26 MED ORDER — POTASSIUM CHLORIDE CRYS ER 20 MEQ PO TBCR
40.0000 meq | EXTENDED_RELEASE_TABLET | Freq: Once | ORAL | Status: AC
  Administered 2024-04-26: 40 meq via ORAL
  Filled 2024-04-26 (×2): qty 2

## 2024-04-26 MED ORDER — LACTATED RINGERS IV SOLN: INTRAVENOUS | Status: AC

## 2024-04-26 MED ORDER — HYDROCODONE-ACETAMINOPHEN 5-325 MG PO TABS
1.0000 | ORAL_TABLET | Freq: Once | ORAL | Status: AC
  Administered 2024-04-26: 1 via ORAL
  Filled 2024-04-26: qty 1

## 2024-04-26 MED ORDER — HYDROMORPHONE HCL 1 MG/ML IJ SOLN
0.5000 mg | INTRAMUSCULAR | Status: DC | PRN
  Filled 2024-04-26: qty 1

## 2024-04-26 MED ORDER — DEXTROSE 50 % IV SOLN: 50.0000 mL | INTRAVENOUS | Status: DC | PRN

## 2024-04-26 MED ORDER — POTASSIUM CHLORIDE CRYS ER 20 MEQ PO TBCR: 40.0000 meq | EXTENDED_RELEASE_TABLET | Freq: Once | ORAL | Status: DC

## 2024-04-26 MED ORDER — SACUBITRIL-VALSARTAN 24-26 MG PO TABS
1.0000 | ORAL_TABLET | Freq: Two times a day (BID) | ORAL | Status: DC
  Administered 2024-04-26 – 2024-05-01 (×11): 1 via ORAL
  Filled 2024-04-26 (×11): qty 1

## 2024-04-26 MED ORDER — ONDANSETRON HCL 4 MG PO TABS: 4.0000 mg | ORAL_TABLET | Freq: Four times a day (QID) | ORAL | Status: DC | PRN

## 2024-04-26 MED ORDER — IBUPROFEN 200 MG PO TABS
200.0000 mg | ORAL_TABLET | Freq: Three times a day (TID) | ORAL | Status: DC
  Administered 2024-04-26 – 2024-04-28 (×7): 200 mg via ORAL
  Filled 2024-04-26 (×7): qty 1

## 2024-04-26 MED ORDER — COLCHICINE 0.6 MG PO TABS
1.2000 mg | ORAL_TABLET | Freq: Once | ORAL | Status: AC
  Administered 2024-04-26: 1.2 mg via ORAL
  Filled 2024-04-26: qty 2

## 2024-04-26 MED ORDER — IBUPROFEN 200 MG PO TABS: 200.0000 mg | ORAL_TABLET | Freq: Three times a day (TID) | ORAL | Status: DC

## 2024-04-26 MED ORDER — SODIUM CHLORIDE 0.9% FLUSH: 3.0000 mL | INTRAVENOUS | Status: DC | PRN

## 2024-04-26 MED ORDER — SIMVASTATIN 10 MG PO TABS
10.0000 mg | ORAL_TABLET | Freq: Every day | ORAL | Status: DC
  Administered 2024-04-26 – 2024-04-30 (×5): 10 mg via ORAL
  Filled 2024-04-26 (×5): qty 1

## 2024-04-26 MED ORDER — ACETAMINOPHEN 650 MG RE SUPP: 650.0000 mg | Freq: Four times a day (QID) | RECTAL | Status: DC | PRN

## 2024-04-26 MED ORDER — PIPERACILLIN-TAZOBACTAM 3.375 G IVPB 30 MIN
3.3750 g | Freq: Once | INTRAVENOUS | Status: AC
Start: 2024-04-26 — End: 2024-04-26
  Administered 2024-04-26: 3.375 g via INTRAVENOUS
  Filled 2024-04-26: qty 50

## 2024-04-26 MED ORDER — ALLOPURINOL 100 MG PO TABS: 100.0000 mg | ORAL_TABLET | Freq: Every day | ORAL | Status: DC

## 2024-04-26 MED ORDER — PIPERACILLIN-TAZOBACTAM 3.375 G IVPB
3.3750 g | Freq: Three times a day (TID) | INTRAVENOUS | Status: DC
  Administered 2024-04-26 – 2024-05-01 (×15): 3.375 g via INTRAVENOUS
  Filled 2024-04-26 (×15): qty 50

## 2024-04-26 MED ORDER — FUROSEMIDE 40 MG PO TABS: 40.0000 mg | ORAL_TABLET | Freq: Every day | ORAL | Status: DC

## 2024-04-26 MED ORDER — SODIUM CHLORIDE 0.9% FLUSH
3.0000 mL | Freq: Two times a day (BID) | INTRAVENOUS | Status: DC
  Administered 2024-04-26 – 2024-05-01 (×11): 3 mL via INTRAVENOUS

## 2024-04-26 MED ORDER — SODIUM CHLORIDE 0.9% FLUSH
3.0000 mL | Freq: Two times a day (BID) | INTRAVENOUS | Status: DC
  Administered 2024-04-26 – 2024-05-01 (×12): 3 mL via INTRAVENOUS

## 2024-04-26 MED ORDER — EMPAGLIFLOZIN 25 MG PO TABS
25.0000 mg | ORAL_TABLET | Freq: Every day | ORAL | Status: DC
  Administered 2024-04-26 – 2024-05-01 (×6): 25 mg via ORAL
  Filled 2024-04-26 (×7): qty 1

## 2024-04-26 MED ORDER — ALPRAZOLAM 0.5 MG PO TABS
0.5000 mg | ORAL_TABLET | Freq: Two times a day (BID) | ORAL | Status: DC | PRN
  Administered 2024-04-26 – 2024-04-30 (×4): 0.5 mg via ORAL
  Filled 2024-04-26 (×4): qty 1

## 2024-04-26 MED ORDER — IOHEXOL 300 MG/ML  SOLN
100.0000 mL | Freq: Once | INTRAMUSCULAR | Status: AC | PRN
  Administered 2024-04-28: 100 mL via INTRAVENOUS

## 2024-04-26 MED ORDER — SODIUM CHLORIDE 0.9 % IV SOLN: 250.0000 mL | INTRAVENOUS | Status: AC | PRN

## 2024-04-26 MED ORDER — ACETAMINOPHEN 325 MG PO TABS: 650.0000 mg | ORAL_TABLET | Freq: Four times a day (QID) | ORAL | Status: DC | PRN

## 2024-04-26 MED ORDER — AMIODARONE HCL 200 MG PO TABS
200.0000 mg | ORAL_TABLET | Freq: Every day | ORAL | Status: DC
  Administered 2024-04-26 – 2024-05-01 (×6): 200 mg via ORAL
  Filled 2024-04-26 (×7): qty 1

## 2024-04-26 MED ORDER — HEPARIN (PORCINE) 25000 UT/250ML-% IV SOLN
1150.0000 [IU]/h | INTRAVENOUS | Status: AC
  Administered 2024-04-26: 1400 [IU]/h via INTRAVENOUS
  Administered 2024-04-26: 1200 [IU]/h via INTRAVENOUS
  Administered 2024-04-28: 1150 [IU]/h via INTRAVENOUS
  Filled 2024-04-26 (×4): qty 250

## 2024-04-26 MED ORDER — POLYETHYLENE GLYCOL 3350 17 G PO PACK
17.0000 g | PACK | Freq: Two times a day (BID) | ORAL | Status: DC
  Filled 2024-04-26: qty 1

## 2024-04-26 MED ORDER — IOHEXOL 300 MG/ML  SOLN
100.0000 mL | Freq: Once | INTRAMUSCULAR | Status: AC | PRN
  Administered 2024-04-26: 100 mL via INTRAVENOUS

## 2024-04-26 MED ORDER — ONDANSETRON HCL 4 MG/2ML IJ SOLN: 4.0000 mg | Freq: Four times a day (QID) | INTRAMUSCULAR | Status: DC | PRN

## 2024-04-26 NOTE — ED Notes (Signed)
 Writer spoke with Bard with pharmacy about compatibility between lactated ringer 's, zosyn  and heparin . Bard advised all are compatible and cleared to run all 3 at the same time.

## 2024-04-26 NOTE — Progress Notes (Signed)
 IV consult placed. Patient on heparin  drip with one access site. Patient needs IVF and IV abx.

## 2024-04-26 NOTE — H&P (Signed)
 History and Physical    Stephanie [name] FMW:[id number] DOB: 1950-11-25 DOA: 04/25/2024  PCP: Joshua Debby CROME, MD   Patient coming from: Home   Chief Complaint:  Chief Complaint  Patient presents with   Abdominal Pain   ED TRIAGE note:  Complaining of pain in the abdomen that started 2 days ago thought that it would go away but has gotten worse. She thinks her diverticulitis is acting up. She is also complaining of pain in the right index finger. It is swollen and the joints hurt to move.      HPI:  Stephanie [name] is a 73 y.o. female with medical history significant of paroxysmal atrial fibrillation, gout, generalized anxiety disorder, essential hypertension, hyperlipidemia, HFrEF 40 to 45%, CKD 3A, non-insulin -dependent DM type II, anemia of chronic disease, asthma, chronic polyp, diverticulosis, essential hypertension, and insomnia presented to emergency department complaining of worsening abdominal pain for last 2 days and right-sided index finger pain with swelling.  During my evaluation at the bedside patient reported that over the course of last several days she has continues to have abdominal pain and rectal pain as well.  Denies any fever, chill, nausea, vomiting and diarrhea.  Denies any hematemesis and melena.  Reported abdominal pain is generalized however most severe on the left lower side. Patient also reported left-sided index finger pain which resolved and the pain has been moved to her right sided index finger now with associated swelling of the joint.  Patient reported she has history of gout however not being sure if she is taking allopurinol  on a daily basis.  History of there is no other complaint.  ED Course:  Presentation to ED patient found hypertensive blood pressure was 191/70 otherwise hemodynamically stable. CBC unremarkable stable H&H 11.2 and 35.  Normal WBC platelet count. CMP showing low potassium 3.4, creatinine 1.3 and GFR 58.  Renal function at  baseline.  Normal lipase level.  Normal hepatic function panel. UA showing evidence of UTI.  CT abdomen pelvis showing evidence for diverticulitis and perirectal abscess measuring up to 5.7 cm.  Anterior abdominal wall scarring and omental fat necrosis likely postoperative.  In the ED patient has been given Dilaudid , Zosyn .  General Surgery Dr. Aron has been consulted by Dr. Griselda.  Stated that the location of the rectal abscess is deep and not sure it would be able to amenable surgical drainage however will see patient in the morning for final recommendation.  Hospitalist has been consulted for further evaluation management of acute diverticulitis and rectal abscess.   Significant labs in the ED: Lab Orders         Culture, blood (Routine X 2) w Reflex to ID Panel         Lipase, blood         Comprehensive metabolic panel         CBC         Urinalysis, Routine w reflex microscopic -Urine, Clean Catch         Protime-INR         Comprehensive metabolic panel         CBC         CBC         Uric acid         Heparin  level (unfractionated)       Review of Systems:  Review of Systems  Constitutional:  Negative for chills, fever and weight loss.  Respiratory:  Negative for cough, sputum production and shortness of  breath.   Cardiovascular:  Negative for chest pain, palpitations and leg swelling.  Gastrointestinal:  Positive for abdominal pain. Negative for constipation, diarrhea, heartburn, nausea and vomiting.  Genitourinary:  Negative for dysuria, flank pain, frequency, hematuria and urgency.  Musculoskeletal:  Positive for joint pain.       Right-sided index finger and joint pain  Neurological:  Negative for dizziness and headaches.  Psychiatric/Behavioral:  The patient is not nervous/anxious.     Past Medical History:  Diagnosis Date   Anemia    Ankle fracture, right    x 2   Antineoplastic and immunosuppressive drugs causing adverse effect in therapeutic use     Asthma    Chronic venous hypertension without complications    Colon polyps 2009   Coronary artery disease    no CAD by cath 11.2010   Depressive disorder, not elsewhere classified    Diabetes mellitus type 2 in obese    hgb AIC 6.1 09/02/2009, insulin  dependent    Diastolic CHF, chronic (HCC)    reports stable   Diverticulosis of colon 2009   colonoscopy   Gout    cortisone  shots helped   Hepatitis, unspecified    hx of/per pt, never was told of having hepatitis   Hx of hysterectomy 2002   Hyperlipidemia    Hypertension    controlled on medication    Hypothyroidism    reports her thryoid levels are normal now    Irritable bowel syndrome    Kidney disease    Lung nodule    12mm RLL nodule on CT Chest  07/2009, resolved by 2011 CT   Noncompliance    Obesity    Persistent atrial fibrillation (HCC)    s/p DCCV 07/2009; Pradaxa  Rx, states she is in sinus rhythm now    PONV (postoperative nausea and vomiting)    after receiving too much anesthesia after a hernia surgery    Rapid atrial fibrillation (HCC) 10/06/2023   Sarcoidosis    dx by eye exam; seen during eye exam , report asymtptomatic i dont have it now'    Sleep disturbance 06/2013   sleep study, mild abnormality, no criteria for CPAP    Past Surgical History:  Procedure Laterality Date   ABDOMINAL HYSTERECTOMY     CARDIOVERSION     x 2   CARDIOVERSION N/A 02/27/2013   Procedure: CARDIOVERSION;  Surgeon: Redell GORMAN Shallow, MD;  Location: [facility] ENDOSCOPY;  Service: Cardiovascular;  Laterality: N/A;   CARDIOVERSION N/A 03/24/2017   Procedure: CARDIOVERSION;  Surgeon: Delford Maude BROCKS, MD;  Location: [facility] ENDOSCOPY;  Service: Cardiovascular;  Laterality: N/A;   CARDIOVERSION N/A 03/04/2020   Procedure: CARDIOVERSION;  Surgeon: Delford Maude BROCKS, MD;  Location: [facility] ENDOSCOPY;  Service: Cardiovascular;  Laterality: N/A;   CARDIOVERSION N/A 06/04/2023   Procedure: CARDIOVERSION;  Surgeon: Barbaraann Darryle Ned, MD;  Location: [facility]  INVASIVE CV LAB;  Service: Cardiovascular;  Laterality: N/A;   CARDIOVERSION N/A 08/16/2023   Procedure: CARDIOVERSION;  Surgeon: Mona Vinie BROCKS, MD;  Location: [facility] INVASIVE CV LAB;  Service: Cardiovascular;  Laterality: N/A;   CARDIOVERSION N/A 01/04/2024   Procedure: CARDIOVERSION;  Surgeon: Sheena Pugh, DO;  Location: [facility] INVASIVE CV LAB;  Service: Cardiovascular;  Laterality: N/A;   CHOLECYSTECTOMY  early 80s   COLONOSCOPY     Diverticulosis, colon polyps, repeat 2014, Dr. Debrah   COLONOSCOPY WITH PROPOFOL  N/A 07/25/2019   Procedure: COLONOSCOPY WITH PROPOFOL ;  Surgeon: Leigh Elspeth SQUIBB, MD;  Location: [facility] ENDOSCOPY;  Service:  Gastroenterology;  Laterality: N/A;   ESOPHAGOGASTRODUODENOSCOPY N/A 05/16/2014   Procedure: ESOPHAGOGASTRODUODENOSCOPY (EGD);  Surgeon: Lesta JULIANNA Fitz, MD;  Location: [facility] ORS;  Service: Gastroenterology;  Laterality: N/A;   HERNIA REPAIR  2009   umbilical hernia   MYOMECTOMY  2000   POLYPECTOMY  07/25/2019   Procedure: POLYPECTOMY;  Surgeon: Leigh Elspeth SQUIBB, MD;  Location: [facility] ENDOSCOPY;  Service: Gastroenterology;;     reports that she has never smoked. She has never used smokeless tobacco. She reports current alcohol use. She reports that she does not use drugs.  Allergies  Allergen Reactions   Amiodarone  Other (See Comments)    Tremor/gait disurbance   Aspirin  Other (See Comments)    Burns my stomach   Methimazole  Nausea And Vomiting   Propoxyphene Nausea And Vomiting   Trintellix  [Vortioxetine ] Nausea And Vomiting   Fentanyl  Nausea And Vomiting   Albuterol  Palpitations   Lipitor [Atorvastatin] Other (See Comments)    Body aches and pains--stiffness.    Livalo  [Pitavastatin ] Other (See Comments)    Body aches    Family History  Problem Relation Age of Onset   Multiple sclerosis Mother        hx   Pneumonia Father        deceased   Hypertension Father    Cancer Father    Emphysema Other        runs in the family   Colon cancer Neg Hx     Colon polyps Neg Hx    Esophageal cancer Neg Hx    Stomach cancer Neg Hx    Rectal cancer Neg Hx     Prior to Admission medications   Medication Sig Start Date End Date Taking? Authorizing Provider  allopurinol  (ZYLOPRIM ) 100 MG tablet TAKE 1/2 TABLET(50 MG) BY MOUTH TWICE DAILY Patient taking differently: Take 100 mg by mouth daily. 08/27/22  Yes Joane Artist RAMAN, MD  ALPRAZolam  (XANAX ) 0.5 MG tablet Take 1 tablet (0.5 mg total) by mouth 3 (three) times daily as needed for anxiety. 04/19/23  Yes Joshua Debby CROME, MD  amiodarone  (PACERONE ) 200 MG tablet Take 1 tablet (200 mg total) by mouth 2 (two) times daily for 30 days, THEN 1 tablet (200 mg total) daily. Patient taking differently: Take 1 tablet (200 mg total) Sometimes take and additional 200 mg if needed 08/23/23 09/21/24 Yes Terra Fairy PARAS, PA-C  colchicine  0.6 MG tablet Take 1 tablet (0.6 mg total) by mouth daily. Patient taking differently: Take 0.6 mg by mouth daily as needed. 12/03/23  Yes Joshua Debby CROME, MD  dabigatran  (PRADAXA ) 150 MG CAPS capsule Take 1 capsule (150 mg total) by mouth 2 (two) times daily. 12/16/23  Yes Fernande Elspeth BROCKS, MD  furosemide  (LASIX ) 40 MG tablet Take 1 tablet (40 mg total) by mouth daily. Patient taking differently: Take 40 mg by mouth daily as needed for edema or fluid. 01/11/23  Yes Joshua Debby CROME, MD  ibuprofen  (ADVIL ) 200 MG tablet Take 400 mg by mouth daily as needed for moderate pain (pain score 4-6) or mild pain (pain score 1-3).   Yes [provider]  JARDIANCE  25 MG TABS tablet TAKE 1 TABLET(25 MG) BY MOUTH DAILY 03/06/24  Yes Joshua Debby CROME, MD  KERENDIA  20 MG TABS TAKE 1 TABLET(20 MG) BY MOUTH DAILY 11/13/23  Yes Joshua Debby CROME, MD  metoprolol  succinate (TOPROL -XL) 50 MG 24 hr tablet Take 1 tablet (50 mg total) by mouth daily. 12/16/23  Yes Fernande Elspeth BROCKS, MD  polyethylene glycol (  MIRALAX  / GLYCOLAX ) 17 g packet Take 17 g by mouth 2 (two) times daily. Titrate as needed 10/07/23  Yes  Armbruster, Elspeth SQUIBB, MD  sacubitril -valsartan  (ENTRESTO ) 24-26 MG TAKE 1 TABLET BY MOUTH TWICE DAILY 06/07/23  Yes Fernande Elspeth BROCKS, MD  simvastatin  (ZOCOR ) 10 MG tablet TAKE 1 TABLET(10 MG) BY MOUTH AT BEDTIME 03/09/24  Yes Webb, Padonda B, FNP  terbinafine (LAMISIL) 1 % cream Apply 1 Application topically 2 (two) times daily as needed (skin irritation/itching.).   Yes [provider]  tirzepatide  (MOUNJARO ) 5 MG/0.5ML Pen Inject 5 mg into the skin once a week. 12/28/23  Yes Joshua Debby CROME, MD  Accu-Chek FastClix Lancets MISC  09/26/18   [provider]  Blood Pressure KIT 1 kit by Does not apply route daily. Check blood pressure once a day 03/11/21   Fernande Elspeth BROCKS, MD  neomycin-colistin-hydrocortisone -thonzonium (CORTISPORIN -TC) 3.12-19-08-0.5 MG/ML OTIC suspension Place 3 drops into the left ear 3 (three) times daily. Patient not taking: Reported on 12/31/2023 08/24/23   Joshua Debby CROME, MD     Physical Exam: Vitals:   04/25/24 2153 04/25/24 2203 04/25/24 2204 04/26/24 0246  BP: (!) 154/68   (!) 191/76  Pulse: 72   82  Resp: 18   18  Temp: 98.6 F (37 C)   98.3 F (36.8 C)  TempSrc: Oral   Oral  SpO2: 99% 99%  97%  Weight:   114.8 kg   Height:   5' 3 (1.6 m)     Physical Exam Constitutional:      Appearance: She is not ill-appearing.  Cardiovascular:     Rate and Rhythm: Normal rate and regular rhythm.  Abdominal:     General: Abdomen is protuberant. Bowel sounds are normal. There is no distension.     Palpations: Abdomen is soft.     Tenderness: There is no abdominal tenderness. There is no guarding or rebound.     Hernia: There is no hernia in the umbilical area or ventral area.  Skin:    General: Skin is warm.     Capillary Refill: Capillary refill takes less than 2 seconds.  Neurological:     Mental Status: She is alert and oriented to person, place, and time.  Psychiatric:        Mood and Affect: Mood normal. Mood is not anxious.        Behavior:  Behavior normal.      Labs on Admission: I have personally reviewed following labs and imaging studies  CBC: Recent Labs  Lab 04/25/24 2216  WBC 8.1  HGB 11.2*  HCT 35.5*  MCV 95.4  PLT 229   Basic Metabolic Panel: Recent Labs  Lab 04/25/24 2216  NA 141  K 3.4*  CL 103  CO2 25  GLUCOSE 174*  BUN 21  CREATININE 1.03*  CALCIUM  9.5   GFR: Estimated Creatinine Clearance: 61.2 mL/min (A) (by C-G formula based on SCr of 1.03 mg/dL (H)). Liver Function Tests: Recent Labs  Lab 04/25/24 2216  AST 14*  ALT 11  ALKPHOS 93  BILITOT 0.6  PROT 8.0  ALBUMIN 3.8   Recent Labs  Lab 04/25/24 2216  LIPASE 32   No results for input(s): AMMONIA in the last 168 hours. Coagulation Profile: No results for input(s): INR, PROTIME in the last 168 hours. Cardiac Enzymes: No results for input(s): CKTOTAL, CKMB, CKMBINDEX, TROPONINI, TROPONINIHS in the last 168 hours. BNP (last 3 results) No results for input(s): BNP in the last 8760  hours. HbA1C: No results for input(s): HGBA1C in the last 72 hours. CBG: No results for input(s): GLUCAP in the last 168 hours. Lipid Profile: No results for input(s): CHOL, HDL, LDLCALC, TRIG, CHOLHDL, LDLDIRECT in the last 72 hours. Thyroid  Function Tests: No results for input(s): TSH, T4TOTAL, FREET4, T3FREE, THYROIDAB in the last 72 hours. Anemia Panel: No results for input(s): VITAMINB12, FOLATE, FERRITIN, TIBC, IRON, RETICCTPCT in the last 72 hours. Urine analysis:    Component Value Date/Time   COLORURINE YELLOW 04/25/2024 2216   APPEARANCEUR HAZY (A) 04/25/2024 2216   LABSPEC 1.035 (H) 04/25/2024 2216   PHURINE 5.0 04/25/2024 2216   GLUCOSEU >=500 (A) 04/25/2024 2216   GLUCOSEU >=1000 (A) 12/02/2023 1623   HGBUR NEGATIVE 04/25/2024 2216   HGBUR negative 09/02/2009 1428   BILIRUBINUR NEGATIVE 04/25/2024 2216   BILIRUBINUR negative 11/26/2023 1630   BILIRUBINUR n 08/02/2015 1125    KETONESUR NEGATIVE 04/25/2024 2216   PROTEINUR NEGATIVE 04/25/2024 2216   UROBILINOGEN 0.2 12/02/2023 1623   NITRITE NEGATIVE 04/25/2024 2216   LEUKOCYTESUR TRACE (A) 04/25/2024 2216    Radiological Exams on Admission: I have personally reviewed images CT ABDOMEN PELVIS W CONTRAST Result Date: 04/26/2024 CLINICAL DATA:  Left lower quadrant abdominal pain starting 2 days ago. History of diverticulitis. EXAM: CT ABDOMEN AND PELVIS WITH CONTRAST TECHNIQUE: Multidetector CT imaging of the abdomen and pelvis was performed using the standard protocol following bolus administration of intravenous contrast. RADIATION DOSE REDUCTION: This exam was performed according to the departmental dose-optimization program which includes automated exposure control, adjustment of the mA and/or kV according to patient size and/or use of iterative reconstruction technique. CONTRAST:  OMNIPAQUE  IOHEXOL  300 MG/ML  SOLN COMPARISON:  10/14/2023 FINDINGS: Lower chest: Lung bases are clear. Hepatobiliary: No focal liver abnormality is seen. Status post cholecystectomy. No biliary dilatation. Pancreas: Unremarkable. No pancreatic ductal dilatation or surrounding inflammatory changes. Spleen: Normal in size without focal abnormality. Adrenals/Urinary Tract: Adrenal glands are unremarkable. Kidneys are normal, without renal calculi, focal lesion, or hydronephrosis. Bladder is unremarkable. Stomach/Bowel: Stomach, small bowel, and colon are not abnormally distended. Diverticulosis of the sigmoid colon. Pericolonic stranding with pericolonic fluid and edema consistent with acute diverticulitis. Loculated collection in the pre rectal space measuring 5.7 cm diameter consistent with abscess. Small amount of free fluid in the pelvis is likely reactive. Vascular/Lymphatic: No significant vascular findings are present. No enlarged abdominal or pelvic lymph nodes. Reproductive: Uterus is surgically absent. Ovaries are not enlarged. Other:  No free air in the abdomen. Scarring along the midline anterior abdominal wall consistent with postoperative change. Omental focus of fat necrosis is also likely postoperative. Musculoskeletal: Degenerative changes in the spine. No acute bony abnormalities. IMPRESSION: 1. Diverticulosis of the sigmoid colon with inflammatory changes consistent with acute diverticulitis. Pre rectal abscess measuring 5.7 cm. Free fluid in the pelvis is likely reactive. 2. Anterior abdominal wall scarring and omental fat necrosis likely postoperative. Electronically Signed   By: Elsie Gravely M.D.   On: 04/26/2024 02:22     Assessment/Plan: Principal Problem:   Rectal abscess Active Problems:   Acute diverticulitis   History of atrial fibrillation   Essential hypertension   Gout flare   Chronic systolic CHF (congestive heart failure) (HCC)   CKD stage 3a, GFR 45-59 ml/min (HCC)   Non-insulin  dependent type 2 diabetes mellitus (HCC)   QT prolongation    Assessment and Plan: Rectal abscess Acute diverticulitis -Presented emergency department complaining of abdominal pain which has been progressively worse for last  2 days.  Patient reported she has history of previous diverticulitis and concerning for diverticular flare.  Denies any fever, chill, nausea, vomiting and diarrhea.  Patient denies any rectal pain and tenesmus. - At presentation to ED patient found hypertensive otherwise hemodynamically stable. -CBC unremarkable stable H&H.  CMP showing low potassium 3.4, renal function at baseline.  Hepatic function panel and lipase level.  UA unremarkable. - CT abdomen pelvis showed evidence fo acute diverticulitis and prerectal abscess measuring 5.7 cm.  Free fluid in the pelvis likely reactive.  Anterior abdominal wall scarring and omental fat necrosis likely postoperative. - General Surgery Dr. Aron has been consulted by Dr. Griselda.  Stated that the location of the rectal abscess is deep and not sure it would be  able to amenable surgical drainage however will see patient in the morning for final recommendation. - Obtaining blood cultures.  Continue IV Zosyn  with pharmacy consult.   - Keeping patient n.p.o. in case will undergo any surgical intervention/drainage of the rectal abscess. - Holding Pradaxa  and by the meantime continue IV heparin  with pharmacy consult for the management of chronic atrial fibrillation.  Need to hold heparin  drip 4 to 6-hour before any surgical intervention. Starting maintenance fluid LR 50 cc/h.   History of pexmetinib fibrillation and atrial flutter -Hold Pradaxa  as there is possible chance for rectal abscess drainage by general surgery.  While holding Pradaxa  continue IV heparin  drip.  Need to hold the heparin  drip 4 to 6-hour before any surgical intervention. - Continue amiodarone  and Toprol -XL.  QTc prolongation -EKG showed normal sinus rhythm heart rate 78, left ventricular hypertrophy pattern, and prolonged QTc interval of 574 msec. Need to avoid any QT prolonging medications.   Chronic systolic heart failure reduced EF 40 to 45% Essential hypertension -Continue Entresto  and Toprol -XL. -Holding Jardiance  and Lasix  given patient is NPO.  Currently on maintenance fluid 50 cc/h.   Hypokalemia - Low potassium 3.4.  Giving oral KCl 20 McCue.  Non-insulin -dependent DM type II - Holding Jardiance  as patient is NPO.  Generalized anxiety disorder - Continue Xanax   Gout flare of the right second metacarpophalangeal joint - Physical exam showed swelling erythema and pain of the right second interphalangeal joint.  History of gout - Concern for gout flare starting colchicine  for 7 days. - Continue ibuprofen  200 mg 3 times daily for 5 days alongside continue Protonix  40 mg twice daily for 5 days. -Obtaining x-ray of the right hand and checking uric acid level.   DVT prophylaxis:  IV heparin  gtts Code Status:  Full Code Diet: NPO Disposition Plan: Holding Pradaxa   and continue IV heparin  drip until will decide for any surgical intervention or not. Consults: General surgery Admission status:   Inpatient, Telemetry bed  Severity of Illness: The appropriate patient status for this patient is INPATIENT. Inpatient status is judged to be reasonable and necessary in order to provide the required intensity of service to ensure the patient's safety. The patient's presenting symptoms, physical exam findings, and initial radiographic and laboratory data in the context of their chronic comorbidities is felt to place them at high risk for further clinical deterioration. Furthermore, it is not anticipated that the patient will be medically stable for discharge from the hospital within 2 midnights of admission.   * I certify that at the point of admission it is my clinical judgment that the patient will require inpatient hospital care spanning beyond 2 midnights from the point of admission due to high intensity of service, high risk  for further deterioration and high frequency of surveillance required.DEWAINE    Kaysen Deal, MD [facility]  How to contact the [facility] Attending or Consulting provider 7A - 7P or covering provider during after hours 7P -7A, for this patient.  Check the care team in [facility] and look for a) attending/consulting [facility] provider listed and b) the [facility] team listed Log into [email or web address] and use [facility] universal password to access. If you do not have the password, please contact the hospital operator. Locate the [facility] provider you are looking for under [facility] and page to a number that you can be directly reached. If you still have difficulty reaching the provider, please page the [facility] (Director on Call) for the Hospitalists listed on amion for assistance.  04/26/2024, 4:32 AM

## 2024-04-26 NOTE — ED Provider Notes (Signed)
 [place] EMERGENCY DEPARTMENT AT [facility] Provider Note   CSN: [id number] Arrival date & time: 04/25/24  2127     Patient presents with: Abdominal Pain   Stephanie [name] is a 73 y.o. female.   The history is provided by the patient and medical records.  Abdominal Pain Stephanie [name] is a 73 y.o. female who presents to the Emergency Department complaining of abdominal pain.  She presents emergency department for evaluation of lower abdominal pain that started 3 days ago.  She does feel hot sometimes and has sweating episodes but has not checked for fever.  She was nauseated on Sunday.  She feels like it takes longer to urinate and has pressure with urination but no dysuria.  No diarrhea, vomiting.  She does have a history of diabetes, CHF and A-fib.  She takes Pradaxa .  She also has a history of diverticulitis in the past.  She feels like this is similar to prior episodes of diverticulitis.   Prior to Admission medications   Medication Sig Start Date End Date Taking? Authorizing Provider  allopurinol  (ZYLOPRIM ) 100 MG tablet TAKE 1/2 TABLET(50 MG) BY MOUTH TWICE DAILY Patient taking differently: Take 100 mg by mouth daily. 08/27/22  Yes Joane Artist RAMAN, MD  ALPRAZolam  (XANAX ) 0.5 MG tablet Take 1 tablet (0.5 mg total) by mouth 3 (three) times daily as needed for anxiety. 04/19/23  Yes Joshua Debby CROME, MD  amiodarone  (PACERONE ) 200 MG tablet Take 1 tablet (200 mg total) by mouth 2 (two) times daily for 30 days, THEN 1 tablet (200 mg total) daily. Patient taking differently: Take 1 tablet (200 mg total) Sometimes take and additional 200 mg if needed 08/23/23 09/21/24 Yes Terra Fairy PARAS, PA-C  colchicine  0.6 MG tablet Take 1 tablet (0.6 mg total) by mouth daily. Patient taking differently: Take 0.6 mg by mouth daily as needed. 12/03/23  Yes Joshua Debby CROME, MD  dabigatran  (PRADAXA ) 150 MG CAPS capsule Take 1 capsule (150 mg total) by mouth 2 (two) times daily. 12/16/23  Yes Fernande Elspeth BROCKS, MD  furosemide  (LASIX ) 40 MG tablet Take 1 tablet (40 mg total) by mouth daily. Patient taking differently: Take 40 mg by mouth daily as needed for edema or fluid. 01/11/23  Yes Joshua Debby CROME, MD  ibuprofen  (ADVIL ) 200 MG tablet Take 400 mg by mouth daily as needed for moderate pain (pain score 4-6) or mild pain (pain score 1-3).   Yes [provider]  JARDIANCE  25 MG TABS tablet TAKE 1 TABLET(25 MG) BY MOUTH DAILY 03/06/24  Yes Joshua Debby CROME, MD  KERENDIA  20 MG TABS TAKE 1 TABLET(20 MG) BY MOUTH DAILY 11/13/23  Yes Joshua Debby CROME, MD  metoprolol  succinate (TOPROL -XL) 50 MG 24 hr tablet Take 1 tablet (50 mg total) by mouth daily. 12/16/23  Yes Fernande Elspeth BROCKS, MD  polyethylene glycol (MIRALAX  / GLYCOLAX ) 17 g packet Take 17 g by mouth 2 (two) times daily. Titrate as needed 10/07/23  Yes Armbruster, Elspeth SQUIBB, MD  sacubitril -valsartan  (ENTRESTO ) 24-26 MG TAKE 1 TABLET BY MOUTH TWICE DAILY 06/07/23  Yes Fernande Elspeth BROCKS, MD  simvastatin  (ZOCOR ) 10 MG tablet TAKE 1 TABLET(10 MG) BY MOUTH AT BEDTIME 03/09/24  Yes Webb, Padonda B, FNP  terbinafine (LAMISIL) 1 % cream Apply 1 Application topically 2 (two) times daily as needed (skin irritation/itching.).   Yes [provider]  tirzepatide  (MOUNJARO ) 5 MG/0.5ML Pen Inject 5 mg into the skin once a week. 12/28/23  Yes Joshua Debby  L, MD  Accu-Chek FastClix Lancets MISC  09/26/18   [provider]  Blood Pressure KIT 1 kit by Does not apply route daily. Check blood pressure once a day 03/11/21   Fernande Elspeth BROCKS, MD  neomycin-colistin-hydrocortisone -thonzonium (CORTISPORIN -TC) 3.12-19-08-0.5 MG/ML OTIC suspension Place 3 drops into the left ear 3 (three) times daily. Patient not taking: Reported on 12/31/2023 08/24/23   Joshua Debby CROME, MD    Allergies: Amiodarone , Aspirin , Methimazole , Propoxyphene, Trintellix  [vortioxetine ], Fentanyl , Albuterol , Lipitor [atorvastatin], and Livalo  [pitavastatin ]    Review of Systems   Gastrointestinal:  Positive for abdominal pain.  All other systems reviewed and are negative.   Updated Vital Signs BP (!) 191/76 (BP Location: Left Arm)   Pulse 82   Temp 98.3 F (36.8 C) (Oral)   Resp 18   Ht 5' 3 (1.6 m)   Wt 114.8 kg   SpO2 97%   BMI 44.82 kg/m   Physical Exam Vitals and nursing note reviewed.  Constitutional:      Appearance: She is well-developed.  HENT:     Head: Normocephalic and atraumatic.  Cardiovascular:     Rate and Rhythm: Normal rate and regular rhythm.  Pulmonary:     Effort: Pulmonary effort is normal. No respiratory distress.  Abdominal:     Palpations: Abdomen is soft.     Tenderness: There is no guarding or rebound.     Comments: Moderate lower abdominal tenderness  Musculoskeletal:        General: No tenderness.  Skin:    General: Skin is warm and dry.  Neurological:     Mental Status: She is alert and oriented to person, place, and time.  Psychiatric:        Behavior: Behavior normal.     (all labs ordered are listed, but only abnormal results are displayed) Labs Reviewed  COMPREHENSIVE METABOLIC PANEL WITH GFR - Abnormal; Notable for the following components:      Result Value   Potassium 3.4 (*)    Glucose, Bld 174 (*)    Creatinine, Ser 1.03 (*)    AST 14 (*)    GFR, Estimated 58 (*)    All other components within normal limits  CBC - Abnormal; Notable for the following components:   RBC 3.72 (*)    Hemoglobin 11.2 (*)    HCT 35.5 (*)    All other components within normal limits  URINALYSIS, ROUTINE W REFLEX MICROSCOPIC - Abnormal; Notable for the following components:   APPearance HAZY (*)    Specific Gravity, Urine 1.035 (*)    Glucose, UA >=500 (*)    Leukocytes,Ua TRACE (*)    Bacteria, UA FEW (*)    All other components within normal limits  CBC - Abnormal; Notable for the following components:   RBC 3.34 (*)    Hemoglobin 10.0 (*)    HCT 31.6 (*)    All other components within normal limits   CULTURE, BLOOD (ROUTINE X 2)  CULTURE, BLOOD (ROUTINE X 2)  LIPASE, BLOOD  PROTIME-INR  COMPREHENSIVE METABOLIC PANEL WITH GFR  URIC ACID  HEPARIN  LEVEL (UNFRACTIONATED)    EKG: None  Radiology: CT ABDOMEN PELVIS W CONTRAST Result Date: 04/26/2024 CLINICAL DATA:  Left lower quadrant abdominal pain starting 2 days ago. History of diverticulitis. EXAM: CT ABDOMEN AND PELVIS WITH CONTRAST TECHNIQUE: Multidetector CT imaging of the abdomen and pelvis was performed using the standard protocol following bolus administration of intravenous contrast. RADIATION DOSE REDUCTION: This exam was performed according to  the departmental dose-optimization program which includes automated exposure control, adjustment of the mA and/or kV according to patient size and/or use of iterative reconstruction technique. CONTRAST:  OMNIPAQUE  IOHEXOL  300 MG/ML  SOLN COMPARISON:  10/14/2023 FINDINGS: Lower chest: Lung bases are clear. Hepatobiliary: No focal liver abnormality is seen. Status post cholecystectomy. No biliary dilatation. Pancreas: Unremarkable. No pancreatic ductal dilatation or surrounding inflammatory changes. Spleen: Normal in size without focal abnormality. Adrenals/Urinary Tract: Adrenal glands are unremarkable. Kidneys are normal, without renal calculi, focal lesion, or hydronephrosis. Bladder is unremarkable. Stomach/Bowel: Stomach, small bowel, and colon are not abnormally distended. Diverticulosis of the sigmoid colon. Pericolonic stranding with pericolonic fluid and edema consistent with acute diverticulitis. Loculated collection in the pre rectal space measuring 5.7 cm diameter consistent with abscess. Small amount of free fluid in the pelvis is likely reactive. Vascular/Lymphatic: No significant vascular findings are present. No enlarged abdominal or pelvic lymph nodes. Reproductive: Uterus is surgically absent. Ovaries are not enlarged. Other: No free air in the abdomen. Scarring along the midline  anterior abdominal wall consistent with postoperative change. Omental focus of fat necrosis is also likely postoperative. Musculoskeletal: Degenerative changes in the spine. No acute bony abnormalities. IMPRESSION: 1. Diverticulosis of the sigmoid colon with inflammatory changes consistent with acute diverticulitis. Pre rectal abscess measuring 5.7 cm. Free fluid in the pelvis is likely reactive. 2. Anterior abdominal wall scarring and omental fat necrosis likely postoperative. Electronically Signed   By: Elsie Gravely M.D.   On: 04/26/2024 02:22     Procedures   Medications Ordered in the ED  iohexol  (OMNIPAQUE ) 300 MG/ML solution 100 mL (has no administration in time range)  amiodarone  (PACERONE ) tablet 200 mg (has no administration in time range)  metoprolol  succinate (TOPROL -XL) 24 hr tablet 50 mg (has no administration in time range)  sacubitril -valsartan  (ENTRESTO ) 24-26 mg per tablet (has no administration in time range)  simvastatin  (ZOCOR ) tablet 10 mg (has no administration in time range)  empagliflozin  (JARDIANCE ) tablet 25 mg (has no administration in time range)  sodium chloride  flush (NS) 0.9 % injection 3 mL (3 mLs Intravenous Given 04/26/24 0322)  sodium chloride  flush (NS) 0.9 % injection 3 mL (3 mLs Intravenous Given 04/26/24 0323)  sodium chloride  flush (NS) 0.9 % injection 3 mL (has no administration in time range)  0.9 %  sodium chloride  infusion (has no administration in time range)  acetaminophen  (TYLENOL ) tablet 650 mg (has no administration in time range)    Or  acetaminophen  (TYLENOL ) suppository 650 mg (has no administration in time range)  HYDROmorphone  (DILAUDID ) injection 0.5-1 mg (has no administration in time range)  piperacillin -tazobactam (ZOSYN ) IVPB 3.375 g (has no administration in time range)  potassium chloride  SA (KLOR-CON  M) CR tablet 20 mEq (has no administration in time range)  lactated ringers  infusion ( Intravenous New Bag/Given 04/26/24 0413)   dextrose  50 % solution 50 mL (has no administration in time range)  heparin  ADULT infusion 100 units/mL (25000 units/250mL) (1,200 Units/hr Intravenous New Bag/Given 04/26/24 0418)  colchicine  tablet 1.2 mg (has no administration in time range)    Followed by  colchicine  tablet 0.6 mg (has no administration in time range)  pantoprazole  (PROTONIX ) EC tablet 40 mg (has no administration in time range)  ibuprofen  (ADVIL ) tablet 200 mg (has no administration in time range)  trimethobenzamide  (TIGAN ) injection 200 mg (has no administration in time range)  HYDROcodone -acetaminophen  (NORCO/VICODIN) 5-325 MG per tablet 1 tablet (1 tablet Oral Given 04/26/24 0023)  iohexol  (OMNIPAQUE ) 300 MG/ML  solution 100 mL (100 mLs Intravenous Contrast Given 04/26/24 0158)  piperacillin -tazobactam (ZOSYN ) IVPB 3.375 g (0 g Intravenous Stopped 04/26/24 0309)                                    Medical Decision Making Amount and/or Complexity of Data Reviewed Labs: ordered. Radiology: ordered.  Risk Prescription drug management. Decision regarding hospitalization.   Patient with history of CHF, A-fib, diabetes on anticoagulation here for evaluation of lower abdominal pain.  She does have tenderness on examination.  CT abdomen pelvis was obtained, which is acute diverticulitis with concerns for perirectal abscess.  Patient started on IV antibiotics.  Discussed with Dr. Aron with general surgery-surgery will see the patient in consult with recommendation for medicine admission.  Medicine consulted for admission for ongoing care.  Patient updated of findings of studies and recommendation for admission and she is in agreement with treatment plan.     Final diagnoses:  Colonic diverticular abscess    ED Discharge Orders     None          Griselda Norris, MD 04/26/24 [phone]

## 2024-04-26 NOTE — Consult Note (Signed)
 Stephanie [name] 1951-09-24  [id number].    Requesting MD: Dr. Almarie Burner Chief Complaint/Reason for Consult: diverticulitis with abscess  HPI:  This is a 73 yo black female with a significant past medical history including PAF on Pradaxa  (LD a couple days a go), gout (currently flare in right index finger), GAD, HTN, HLD, Diastolic HF with EF of 40-45% on last echo this year, CKD, DM, anemia of chronic disease, asthma, and history of diverticulitis who began having central pelvic pain starting on 7/4.  This felt like her previous episodes of diverticulitis.  She complains of pain with moving her bowels, but denies diarrhea/constipation or blood in her stool.  She denies nausea or vomiting.  She states back in December she underwent an attempt at colonoscopy by Dr. Leigh.  This was unable to be completed due to severe sigmoid stricture likely from prior diverticular disease.  She was referred to Dr. Debby in our office, whom she saw, but did not follow up for further surgical intervention.  She has not had any symptoms in the interim until 7/4.  She admits to feeling feverish, but did not take her temperature.  She denies bloating, N/V, or obstructive type symptoms.    Here in the ED she has been found to have diverticulitis with an intraabdominal abscess, normal WBC.  We have been asked to see her for further recommendations.  ROS: ROS: see HPI  Family History  Problem Relation Age of Onset   Multiple sclerosis Mother        hx   Pneumonia Father        deceased   Hypertension Father    Cancer Father    Emphysema Other        runs in the family   Colon cancer Neg Hx    Colon polyps Neg Hx    Esophageal cancer Neg Hx    Stomach cancer Neg Hx    Rectal cancer Neg Hx     Past Medical History:  Diagnosis Date   Anemia    Ankle fracture, right    x 2   Antineoplastic and immunosuppressive drugs causing adverse effect in therapeutic use    Asthma    Chronic venous  hypertension without complications    Colon polyps 2009   Coronary artery disease    no CAD by cath 11.2010   Depressive disorder, not elsewhere classified    Diabetes mellitus type 2 in obese    hgb AIC 6.1 09/02/2009, insulin  dependent    Diastolic CHF, chronic (HCC)    reports stable   Diverticulosis of colon 2009   colonoscopy   Gout    cortisone  shots helped   Hepatitis, unspecified    hx of/per pt, never was told of having hepatitis   Hx of hysterectomy 2002   Hyperlipidemia    Hypertension    controlled on medication    Hypothyroidism    reports her thryoid levels are normal now    Irritable bowel syndrome    Kidney disease    Lung nodule    12mm RLL nodule on CT Chest  07/2009, resolved by 2011 CT   Noncompliance    Obesity    Persistent atrial fibrillation (HCC)    s/p DCCV 07/2009; Pradaxa  Rx, states she is in sinus rhythm now    PONV (postoperative nausea and vomiting)    after receiving too much anesthesia after a hernia surgery    Rapid atrial fibrillation (HCC) 10/06/2023  Sarcoidosis    dx by eye exam; seen during eye exam , report asymtptomatic i dont have it now'    Sleep disturbance 06/2013   sleep study, mild abnormality, no criteria for CPAP    Past Surgical History:  Procedure Laterality Date   ABDOMINAL HYSTERECTOMY     CARDIOVERSION     x 2   CARDIOVERSION N/A 02/27/2013   Procedure: CARDIOVERSION;  Surgeon: Redell GORMAN Shallow, MD;  Location: [facility] ENDOSCOPY;  Service: Cardiovascular;  Laterality: N/A;   CARDIOVERSION N/A 03/24/2017   Procedure: CARDIOVERSION;  Surgeon: Delford Maude BROCKS, MD;  Location: [facility] ENDOSCOPY;  Service: Cardiovascular;  Laterality: N/A;   CARDIOVERSION N/A 03/04/2020   Procedure: CARDIOVERSION;  Surgeon: Delford Maude BROCKS, MD;  Location: [facility] ENDOSCOPY;  Service: Cardiovascular;  Laterality: N/A;   CARDIOVERSION N/A 06/04/2023   Procedure: CARDIOVERSION;  Surgeon: Barbaraann Darryle Ned, MD;  Location: [facility] INVASIVE CV LAB;  Service:  Cardiovascular;  Laterality: N/A;   CARDIOVERSION N/A 08/16/2023   Procedure: CARDIOVERSION;  Surgeon: Mona Vinie BROCKS, MD;  Location: [facility] INVASIVE CV LAB;  Service: Cardiovascular;  Laterality: N/A;   CARDIOVERSION N/A 01/04/2024   Procedure: CARDIOVERSION;  Surgeon: Sheena Pugh, DO;  Location: [facility] INVASIVE CV LAB;  Service: Cardiovascular;  Laterality: N/A;   CHOLECYSTECTOMY  early 80s   COLONOSCOPY     Diverticulosis, colon polyps, repeat 2014, Dr. Debrah   COLONOSCOPY WITH PROPOFOL  N/A 07/25/2019   Procedure: COLONOSCOPY WITH PROPOFOL ;  Surgeon: Leigh Elspeth SQUIBB, MD;  Location: [facility] ENDOSCOPY;  Service: Gastroenterology;  Laterality: N/A;   ESOPHAGOGASTRODUODENOSCOPY N/A 05/16/2014   Procedure: ESOPHAGOGASTRODUODENOSCOPY (EGD);  Surgeon: Lesta JULIANNA Fitz, MD;  Location: [facility] ORS;  Service: Gastroenterology;  Laterality: N/A;   HERNIA REPAIR  2009   umbilical hernia   MYOMECTOMY  2000   POLYPECTOMY  07/25/2019   Procedure: POLYPECTOMY;  Surgeon: Leigh Elspeth SQUIBB, MD;  Location: [facility] ENDOSCOPY;  Service: Gastroenterology;;    Social History:  reports that she has never smoked. She has never used smokeless tobacco. She reports current alcohol use. She reports that she does not use drugs.  Allergies:  Allergies  Allergen Reactions   Amiodarone  Other (See Comments)    Tremor/gait disurbance   Aspirin  Other (See Comments)    Burns my stomach   Methimazole  Nausea And Vomiting   Propoxyphene Nausea And Vomiting   Trintellix  [Vortioxetine ] Nausea And Vomiting   Fentanyl  Nausea And Vomiting   Albuterol  Palpitations   Lipitor [Atorvastatin] Other (See Comments)    Body aches and pains--stiffness.    Livalo  [Pitavastatin ] Other (See Comments)    Body aches    (Not in a hospital admission)    Physical Exam: Blood pressure (!) 175/63, pulse 80, temperature 98.6 F (37 C), temperature source Oral, resp. rate 18, height 5' 3 (1.6 m), weight 114.8 kg, SpO2 98%. General: pleasant, WD,  obese black female who is laying in bed in NAD HEENT: head is normocephalic, atraumatic.  Sclera are noninjected.  PERRL.  Ears and nose without any masses or lesions.  Mouth is pink and dry Heart: regular, rate, and rhythm.  Normal s1,s2. No obvious murmurs, gallops, or rubs noted.   Lungs: CTAB, no wheezes, rhonchi, or rales noted.  Respiratory effort nonlabored Abd: soft, tender in central abdomen and down into her central pelvis, mild RLQ, but nontender in LLQ, ND, +BS, no masses, hernias, or organomegaly.  Lower midline scar from previous hysterectomy, RUQ scar from open cholecystectomy MS: all 4 extremities are symmetrical with no  cyanosis, clubbing, or edema except her right index finger with pain and edema Psych: A&Ox3 with an appropriate affect.   Results for orders placed or performed during the hospital encounter of 04/25/24 (from the past 48 hours)  Lipase, blood     Status: None   Collection Time: 04/25/24 10:16 PM  Result Value Ref Range   Lipase 32 11 - 51 U/L    Comment: Performed at [facility], [address] [place], [place] [address]  Comprehensive metabolic panel     Status: Abnormal   Collection Time: 04/25/24 10:16 PM  Result Value Ref Range   Sodium 141 135 - 145 mmol/L   Potassium 3.4 (L) 3.5 - 5.1 mmol/L   Chloride 103 98 - 111 mmol/L   CO2 25 22 - 32 mmol/L   Glucose, Bld 174 (H) 70 - 99 mg/dL    Comment: Glucose reference range applies only to samples taken after fasting for at least 8 hours.   BUN 21 8 - 23 mg/dL   Creatinine, Ser 8.96 (H) 0.44 - 1.00 mg/dL   Calcium  9.5 8.9 - 10.3 mg/dL   Total Protein 8.0 6.5 - 8.1 g/dL   Albumin 3.8 3.5 - 5.0 g/dL   AST 14 (L) 15 - 41 U/L   ALT 11 0 - 44 U/L   Alkaline Phosphatase 93 38 - 126 U/L   Total Bilirubin 0.6 0.0 - 1.2 mg/dL   GFR, Estimated 58 (L) >60 mL/min    Comment: (NOTE) Calculated using the CKD-EPI Creatinine Equation (2021)    Anion gap 13 5 - 15    Comment: Performed at  [facility], [address] [place], [place] [address]  CBC     Status: Abnormal   Collection Time: 04/25/24 10:16 PM  Result Value Ref Range   WBC 8.1 4.0 - 10.5 K/uL   RBC 3.72 (L) 3.87 - 5.11 MIL/uL   Hemoglobin 11.2 (L) 12.0 - 15.0 g/dL   HCT 64.4 (L) 63.9 - 53.9 %   MCV 95.4 80.0 - 100.0 fL   MCH 30.1 26.0 - 34.0 pg   MCHC 31.5 30.0 - 36.0 g/dL   RDW 86.8 88.4 - 84.4 %   Platelets 229 150 - 400 K/uL   nRBC 0.0 0.0 - 0.2 %    Comment: Performed at [facility], [address], [place], [place] [address]  Urinalysis, Routine w reflex microscopic -Urine, Clean Catch     Status: Abnormal   Collection Time: 04/25/24 10:16 PM  Result Value Ref Range   Color, Urine YELLOW YELLOW   APPearance HAZY (A) CLEAR   Specific Gravity, Urine 1.035 (H) 1.005 - 1.030   pH 5.0 5.0 - 8.0   Glucose, UA >=500 (A) NEGATIVE mg/dL   Hgb urine dipstick NEGATIVE NEGATIVE   Bilirubin Urine NEGATIVE NEGATIVE   Ketones, ur NEGATIVE NEGATIVE mg/dL   Protein, ur NEGATIVE NEGATIVE mg/dL   Nitrite NEGATIVE NEGATIVE   Leukocytes,Ua TRACE (A) NEGATIVE   RBC / HPF 6-10 0 - 5 RBC/hpf   WBC, UA 0-5 0 - 5 WBC/hpf   Bacteria, UA FEW (A) NONE SEEN   Squamous Epithelial / HPF 11-20 0 - 5 /HPF   Mucus PRESENT     Comment: Performed at [facility], [address], [place], [place] [address]  Culture, blood (Routine X 2) w Reflex to ID Panel     Status: None (Preliminary result)   Collection Time: 04/26/24  4:14 AM   Specimen: BLOOD  Result Value Ref Range   Specimen Description      BLOOD RIGHT ANTECUBITAL Performed at [facility], [address], [place], [place] [address]    Special Requests      BOTTLES DRAWN AEROBIC AND ANAEROBIC Blood Culture results may not be optimal due to an inadequate volume of blood received in culture bottles Performed at [facility], [address] [place], [place] [address]     Culture      NO GROWTH < 12 HOURS Performed at [facility], [address], [place], [place] [address]    Report Status PENDING   Comprehensive metabolic panel     Status: Abnormal   Collection Time: 04/26/24  4:19 AM  Result Value Ref Range   Sodium 138 135 - 145 mmol/L   Potassium 3.2 (L) 3.5 - 5.1 mmol/L   Chloride 105 98 - 111 mmol/L   CO2 23 22 - 32 mmol/L   Glucose, Bld 129 (H) 70 - 99 mg/dL    Comment: Glucose reference range applies only to samples taken after fasting for at least 8 hours.   BUN 19 8 - 23 mg/dL   Creatinine, Ser 8.92 (H) 0.44 - 1.00 mg/dL   Calcium  8.7 (L) 8.9 - 10.3 mg/dL   Total Protein 7.1 6.5 - 8.1 g/dL   Albumin 3.3 (L) 3.5 - 5.0 g/dL   AST 13 (L) 15 - 41 U/L   ALT 8 0 - 44 U/L   Alkaline Phosphatase 78 38 - 126 U/L   Total Bilirubin 0.7 0.0 - 1.2 mg/dL   GFR, Estimated 56 (L) >60 mL/min    Comment: (NOTE) Calculated using the CKD-EPI Creatinine Equation (2021)    Anion gap 10 5 - 15    Comment: Performed at [facility], [address], [place], [place] [address]  CBC     Status: Abnormal   Collection Time: 04/26/24  4:19 AM  Result Value Ref Range   WBC 7.9 4.0 - 10.5 K/uL   RBC 3.34 (L) 3.87 - 5.11 MIL/uL   Hemoglobin 10.0 (L) 12.0 - 15.0 g/dL   HCT 68.3 (L) 63.9 - 53.9 %   MCV 94.6 80.0 - 100.0 fL   MCH 29.9 26.0 - 34.0 pg   MCHC 31.6 30.0 - 36.0 g/dL   RDW 86.8 88.4 - 84.4 %   Platelets 203 150 - 400 K/uL   nRBC 0.0 0.0 - 0.2 %    Comment: Performed at [facility], [address], [place], [place] [address]  Uric acid     Status: None   Collection Time: 04/26/24  4:19 AM  Result Value Ref Range   Uric Acid, Serum 4.1 2.5 - 7.1 mg/dL    Comment: Performed at [facility], [address] [place], [place] [address]  CBG monitoring, ED     Status: Abnormal   Collection Time: 04/26/24  6:46 AM  Result Value Ref Range   Glucose-Capillary 130 (H) 70 - 99 mg/dL    Comment:  Glucose reference range applies only to samples taken after fasting for at least 8 hours.  CBG monitoring, ED     Status: Abnormal   Collection Time: 04/26/24  9:04 AM  Result Value Ref Range   Glucose-Capillary 107 (H) 70 - 99 mg/dL    Comment: Glucose reference range applies only to samples taken after fasting for at least 8  hours.   DG Hand 2 View Right Result Date: 04/26/2024 CLINICAL DATA:  Swelling in the second through fifth metacarpals. EXAM: RIGHT HAND - 2 VIEW COMPARISON:  None Available. FINDINGS: Two-view exam limited by superimposition of bony anatomy on the lateral film. Within this limitation, no acute fracture or dislocation is evident. No worrisome lytic or sclerotic osseous abnormality. IMPRESSION: Negative. Electronically Signed   By: Camellia Candle M.D.   On: 04/26/2024 04:59   CT ABDOMEN PELVIS W CONTRAST Result Date: 04/26/2024 CLINICAL DATA:  Left lower quadrant abdominal pain starting 2 days ago. History of diverticulitis. EXAM: CT ABDOMEN AND PELVIS WITH CONTRAST TECHNIQUE: Multidetector CT imaging of the abdomen and pelvis was performed using the standard protocol following bolus administration of intravenous contrast. RADIATION DOSE REDUCTION: This exam was performed according to the departmental dose-optimization program which includes automated exposure control, adjustment of the mA and/or kV according to patient size and/or use of iterative reconstruction technique. CONTRAST:  OMNIPAQUE  IOHEXOL  300 MG/ML  SOLN COMPARISON:  10/14/2023 FINDINGS: Lower chest: Lung bases are clear. Hepatobiliary: No focal liver abnormality is seen. Status post cholecystectomy. No biliary dilatation. Pancreas: Unremarkable. No pancreatic ductal dilatation or surrounding inflammatory changes. Spleen: Normal in size without focal abnormality. Adrenals/Urinary Tract: Adrenal glands are unremarkable. Kidneys are normal, without renal calculi, focal lesion, or hydronephrosis. Bladder is  unremarkable. Stomach/Bowel: Stomach, small bowel, and colon are not abnormally distended. Diverticulosis of the sigmoid colon. Pericolonic stranding with pericolonic fluid and edema consistent with acute diverticulitis. Loculated collection in the pre rectal space measuring 5.7 cm diameter consistent with abscess. Small amount of free fluid in the pelvis is likely reactive. Vascular/Lymphatic: No significant vascular findings are present. No enlarged abdominal or pelvic lymph nodes. Reproductive: Uterus is surgically absent. Ovaries are not enlarged. Other: No free air in the abdomen. Scarring along the midline anterior abdominal wall consistent with postoperative change. Omental focus of fat necrosis is also likely postoperative. Musculoskeletal: Degenerative changes in the spine. No acute bony abnormalities. IMPRESSION: 1. Diverticulosis of the sigmoid colon with inflammatory changes consistent with acute diverticulitis. Pre rectal abscess measuring 5.7 cm. Free fluid in the pelvis is likely reactive. 2. Anterior abdominal wall scarring and omental fat necrosis likely postoperative. Electronically Signed   By: Elsie Gravely M.D.   On: 04/26/2024 02:22      Assessment/Plan Diverticulitis with abscess, history of severe sigmoid stricture The patient has been seen, examined, labs, vitals, chart, and imaging personally reviewed.  The patient has diverticulitis with an abscess.  We will ask IR to see if this is amendable to drainage to avoid acute surgical intervention which would be a colectomy and colostomy.  If we can get her better with conservative management, then hopefully we can get her to follow up with colorectal again.  Agree with holding Pradaxa  and heparin  gtt ok.  NPO for now.  We will continue to follow.  This plan has been discussed with the patient and she is agreeable.   FEN - NPO/IVFs per primary VTE - heparin  gtt, Pradaxa  on hold ID - Zosyn   Diastolic HF - EF  40-46% DM HTN CKD HLD Gout GAD PAF - Pradaxa  on hold Chronic anemia Asthma  I reviewed ED provider notes, hospitalist notes, last 24 h vitals and pain scores, last 48 h intake and output, last 24 h labs and trends, and last 24 h imaging results.  Burnard FORBES Banter, [facility] 04/26/2024, 11:29 AM Please see Amion for pager number during  day hours 7:00am-4:30pm or 7:00am -11:30am on weekends

## 2024-04-26 NOTE — Plan of Care (Signed)
 Problem: Education: Goal: Ability to describe self-care measures that may prevent or decrease complications (Diabetes Survival Skills Education) will improve 04/26/2024 1905 by Joesph Morna SQUIBB, RN Outcome: Progressing 04/26/2024 1905 by Joesph Morna SQUIBB, RN Outcome: Progressing Goal: Individualized Educational Video(s) 04/26/2024 1905 by Joesph Morna SQUIBB, RN Outcome: Progressing 04/26/2024 1905 by Joesph Morna SQUIBB, RN Outcome: Progressing   Problem: Coping: Goal: Ability to adjust to condition or change in health will improve 04/26/2024 1905 by Joesph Morna SQUIBB, RN Outcome: Progressing 04/26/2024 1905 by Joesph Morna SQUIBB, RN Outcome: Progressing   Problem: Fluid Volume: Goal: Ability to maintain a balanced intake and output will improve 04/26/2024 1905 by Joesph Morna SQUIBB, RN Outcome: Progressing 04/26/2024 1905 by Joesph Morna SQUIBB, RN Outcome: Progressing   Problem: Health Behavior/Discharge Planning: Goal: Ability to identify and utilize available resources and services will improve 04/26/2024 1905 by Joesph Morna SQUIBB, RN Outcome: Progressing 04/26/2024 1905 by Joesph Morna SQUIBB, RN Outcome: Progressing Goal: Ability to manage health-related needs will improve 04/26/2024 1905 by Joesph Morna SQUIBB, RN Outcome: Progressing 04/26/2024 1905 by Joesph Morna SQUIBB, RN Outcome: Progressing   Problem: Metabolic: Goal: Ability to maintain appropriate glucose levels will improve 04/26/2024 1905 by Joesph Morna SQUIBB, RN Outcome: Progressing 04/26/2024 1905 by Joesph Morna SQUIBB, RN Outcome: Progressing   Problem: Nutritional: Goal: Maintenance of adequate nutrition will improve 04/26/2024 1905 by Joesph Morna SQUIBB, RN Outcome: Progressing 04/26/2024 1905 by Joesph Morna SQUIBB, RN Outcome: Progressing Goal: Progress toward achieving an optimal weight will improve 04/26/2024 1905 by Joesph Morna SQUIBB, RN Outcome: Progressing 04/26/2024 1905 by Joesph Morna SQUIBB, RN Outcome: Progressing   Problem:  Skin Integrity: Goal: Risk for impaired skin integrity will decrease 04/26/2024 1905 by Joesph Morna SQUIBB, RN Outcome: Progressing 04/26/2024 1905 by Joesph Morna SQUIBB, RN Outcome: Progressing   Problem: Tissue Perfusion: Goal: Adequacy of tissue perfusion will improve 04/26/2024 1905 by Joesph Morna SQUIBB, RN Outcome: Progressing 04/26/2024 1905 by Joesph Morna SQUIBB, RN Outcome: Progressing   Problem: Education: Goal: Knowledge of General Education information will improve Description: Including pain rating scale, medication(s)/side effects and non-pharmacologic comfort measures 04/26/2024 1905 by Joesph Morna SQUIBB, RN Outcome: Progressing 04/26/2024 1905 by Joesph Morna SQUIBB, RN Outcome: Progressing   Problem: Health Behavior/Discharge Planning: Goal: Ability to manage health-related needs will improve 04/26/2024 1905 by Joesph Morna SQUIBB, RN Outcome: Progressing 04/26/2024 1905 by Joesph Morna SQUIBB, RN Outcome: Progressing   Problem: Clinical Measurements: Goal: Ability to maintain clinical measurements within normal limits will improve 04/26/2024 1905 by Joesph Morna SQUIBB, RN Outcome: Progressing 04/26/2024 1905 by Joesph Morna SQUIBB, RN Outcome: Progressing Goal: Will remain free from infection 04/26/2024 1905 by Joesph Morna SQUIBB, RN Outcome: Progressing 04/26/2024 1905 by Joesph Morna SQUIBB, RN Outcome: Progressing Goal: Diagnostic test results will improve 04/26/2024 1905 by Joesph Morna SQUIBB, RN Outcome: Progressing 04/26/2024 1905 by Joesph Morna SQUIBB, RN Outcome: Progressing Goal: Respiratory complications will improve 04/26/2024 1905 by Joesph Morna SQUIBB, RN Outcome: Progressing 04/26/2024 1905 by Joesph Morna SQUIBB, RN Outcome: Progressing Goal: Cardiovascular complication will be avoided 04/26/2024 1905 by Joesph Morna SQUIBB, RN Outcome: Progressing 04/26/2024 1905 by Joesph Morna SQUIBB, RN Outcome: Progressing   Problem: Activity: Goal: Risk for activity intolerance will  decrease 04/26/2024 1905 by Joesph Morna SQUIBB, RN Outcome: Progressing 04/26/2024 1905 by Joesph Morna SQUIBB, RN Outcome: Progressing   Problem: Nutrition: Goal: Adequate nutrition will be maintained 04/26/2024 1905 by Joesph Morna SQUIBB, RN Outcome: Progressing 04/26/2024 1905 by Joesph Morna SQUIBB, RN Outcome: Progressing  Problem: Coping: Goal: Level of anxiety will decrease 04/26/2024 1905 by Joesph Morna SQUIBB, RN Outcome: Progressing 04/26/2024 1905 by Joesph Morna SQUIBB, RN Outcome: Progressing   Problem: Elimination: Goal: Will not experience complications related to bowel motility 04/26/2024 1905 by Joesph Morna SQUIBB, RN Outcome: Progressing 04/26/2024 1905 by Joesph Morna SQUIBB, RN Outcome: Progressing Goal: Will not experience complications related to urinary retention 04/26/2024 1905 by Joesph Morna SQUIBB, RN Outcome: Progressing 04/26/2024 1905 by Joesph Morna SQUIBB, RN Outcome: Progressing   Problem: Pain Managment: Goal: General experience of comfort will improve and/or be controlled 04/26/2024 1905 by Joesph Morna SQUIBB, RN Outcome: Progressing 04/26/2024 1905 by Joesph Morna SQUIBB, RN Outcome: Progressing   Problem: Safety: Goal: Ability to remain free from injury will improve 04/26/2024 1905 by Joesph Morna SQUIBB, RN Outcome: Progressing 04/26/2024 1905 by Joesph Morna SQUIBB, RN Outcome: Progressing   Problem: Skin Integrity: Goal: Risk for impaired skin integrity will decrease 04/26/2024 1905 by Joesph Morna SQUIBB, RN Outcome: Progressing 04/26/2024 1905 by Joesph Morna SQUIBB, RN Outcome: Progressing

## 2024-04-26 NOTE — Plan of Care (Signed)

## 2024-04-26 NOTE — ED Notes (Signed)
 Per Iantha Batch [facility] heparin  increased to 1400 units per hour

## 2024-04-26 NOTE — Progress Notes (Signed)
 PHARMACY - ANTICOAGULATION CONSULT NOTE  Pharmacy Consult for heparin  Indication: AFib  Allergies  Allergen Reactions   Amiodarone  Other (See Comments)    Tremor/gait disurbance   Aspirin  Other (See Comments)    Burns my stomach   Methimazole  Nausea And Vomiting   Propoxyphene Nausea And Vomiting   Trintellix  [Vortioxetine ] Nausea And Vomiting   Fentanyl  Nausea And Vomiting   Albuterol  Palpitations   Lipitor [Atorvastatin] Other (See Comments)    Body aches and pains--stiffness.    Livalo  [Pitavastatin ] Other (See Comments)    Body aches    Patient Measurements: Height: 5' 3 (160 cm) Weight: 114.8 kg (253 lb) IBW/kg (Calculated) : 52.4 HEPARIN  DW (KG): 80.3  Vital Signs: Temp: 98.3 F (36.8 C) (07/09 0246) Temp Source: Oral (07/09 0246) BP: 191/76 (07/09 0246) Pulse Rate: 82 (07/09 0246)  Labs: Recent Labs    04/25/24 2216  HGB 11.2*  HCT 35.5*  PLT 229  CREATININE 1.03*    Estimated Creatinine Clearance: 61.2 mL/min (A) (by C-G formula based on SCr of 1.03 mg/dL (H)).   Medical History: Past Medical History:  Diagnosis Date   Anemia    Ankle fracture, right    x 2   Antineoplastic and immunosuppressive drugs causing adverse effect in therapeutic use    Asthma    Chronic venous hypertension without complications    Colon polyps 2009   Coronary artery disease    no CAD by cath 11.2010   Depressive disorder, not elsewhere classified    Diabetes mellitus type 2 in obese    hgb AIC 6.1 09/02/2009, insulin  dependent    Diastolic CHF, chronic (HCC)    reports stable   Diverticulosis of colon 2009   colonoscopy   Gout    cortisone  shots helped   Hepatitis, unspecified    hx of/per pt, never was told of having hepatitis   Hx of hysterectomy 2002   Hyperlipidemia    Hypertension    controlled on medication    Hypothyroidism    reports her thryoid levels are normal now    Irritable bowel syndrome    Kidney disease    Lung nodule    12mm RLL  nodule on CT Chest  07/2009, resolved by 2011 CT   Noncompliance    Obesity    Persistent atrial fibrillation (HCC)    s/p DCCV 07/2009; Pradaxa  Rx, states she is in sinus rhythm now    PONV (postoperative nausea and vomiting)    after receiving too much anesthesia after a hernia surgery    Rapid atrial fibrillation (HCC) 10/06/2023   Sarcoidosis    dx by eye exam; seen during eye exam , report asymtptomatic i dont have it now'    Sleep disturbance 06/2013   sleep study, mild abnormality, no criteria for CPAP     Assessment: 73 yo pt on pradaxa  for Afib presents with abdominal abscess, possible surgery, pharmacy to dose heparin .  LD pradaxa  7/7  Hgb 11.2, plts 229, Scr 1.03  Goal of Therapy:  Heparin  level 0.3-0.7 units/ml Monitor platelets by anticoagulation protocol: Yes   Plan:  Start heparin  drip at 1200 units/hr Heparin  level in 8 hours Daily CBC  Leeroy Mace RPh 04/26/2024, 3:26 AM

## 2024-04-26 NOTE — ED Notes (Signed)
 Patient taken to CT.

## 2024-04-26 NOTE — Progress Notes (Addendum)
 PHARMACY - ANTICOAGULATION CONSULT NOTE  Pharmacy Consult for heparin   Indication: hx atrial fibrillation (PTA Pradaxa  on hold)  Allergies  Allergen Reactions   Amiodarone  Other (See Comments)    Tremor/gait disurbance   Aspirin  Other (See Comments)    Burns my stomach   Methimazole  Nausea And Vomiting   Propoxyphene Nausea And Vomiting   Trintellix  [Vortioxetine ] Nausea And Vomiting   Fentanyl  Nausea And Vomiting   Albuterol  Palpitations   Lipitor [Atorvastatin] Other (See Comments)    Body aches and pains--stiffness.    Livalo  [Pitavastatin ] Other (See Comments)    Body aches    Patient Measurements: Height: 5' 3 (160 cm) Weight: 114.8 kg (253 lb) IBW/kg (Calculated) : 52.4 HEPARIN  DW (KG): 80.3  Vital Signs: Temp: 98.6 F (37 C) (07/09 1048) Temp Source: Oral (07/09 1048) BP: 175/63 (07/09 1000) Pulse Rate: 80 (07/09 1000)  Labs: Recent Labs    04/25/24 2216 04/26/24 0419 04/26/24 1153  HGB 11.2* 10.0*  --   HCT 35.5* 31.6*  --   PLT 229 203  --   HEPARINUNFRC  --   --  0.24*  CREATININE 1.03* 1.07*  --     Estimated Creatinine Clearance: 58.9 mL/min (A) (by C-G formula based on SCr of 1.07 mg/dL (H)).   Medical History: Past Medical History:  Diagnosis Date   Anemia    Ankle fracture, right    x 2   Antineoplastic and immunosuppressive drugs causing adverse effect in therapeutic use    Asthma    Chronic venous hypertension without complications    Colon polyps 2009   Coronary artery disease    no CAD by cath 11.2010   Depressive disorder, not elsewhere classified    Diabetes mellitus type 2 in obese    hgb AIC 6.1 09/02/2009, insulin  dependent    Diastolic CHF, chronic (HCC)    reports stable   Diverticulosis of colon 2009   colonoscopy   Gout    cortisone  shots helped   Hepatitis, unspecified    hx of/per pt, never was told of having hepatitis   Hx of hysterectomy 2002   Hyperlipidemia    Hypertension    controlled on medication     Hypothyroidism    reports her thryoid levels are normal now    Irritable bowel syndrome    Kidney disease    Lung nodule    12mm RLL nodule on CT Chest  07/2009, resolved by 2011 CT   Noncompliance    Obesity    Persistent atrial fibrillation (HCC)    s/[name] DCCV 07/2009; Pradaxa  Rx, states she is in sinus rhythm now    PONV (postoperative nausea and vomiting)    after receiving too much anesthesia after a hernia surgery    Rapid atrial fibrillation (HCC) 10/06/2023   Sarcoidosis    dx by eye exam; seen during eye exam , report asymtptomatic i dont have it now'    Sleep disturbance 06/2013   sleep study, mild abnormality, no criteria for CPAP    Medications:  - on Pradaxa   150mg  bid PTA (last dose taken on 04/24/24)  Assessment: Patient is a 73 y.o F with hx afib on Pradaxa  PTA who presented to the ED on 04/25/24 with c/o abdominal pain. Abdominal CT showed acute diverticulitis with perirectal abscess.  Anticoag. changed to heparin  drip in case invasive intervention is needed.    Today, 04/26/2024: - First heparin  level collected at noon is sub-therapeutic at 0.24 - per pt's RN,  no issues with IV line and no bleeding noted - hgb 10, plts 203K  Goal of Therapy:  Heparin  level 0.3-0.7 units/ml Monitor platelets by anticoagulation protocol: Yes   Plan:  - Increase heparin  drip to 1400 units/hr - check 6 hr heparin  level - monitor for s/sx bleeding - If invasive intervention is needed for pt, please advise if/when heparin  drip needs to be stopped prior to procedure  Stephanie [name] 04/26/2024,12:41 PM  _________________________________________  Adden: heparin  level collected at 7pm is therapeutic at 0.53.  Will fu with AM labs and adjust as needed.  Stephanie [name], PharmD, BCPS 04/26/2024 8:03 PM

## 2024-04-26 NOTE — ED Notes (Signed)
 Writer introduced herself and spoke with pt about her pain. Even though pt is in pain she advised she still does not want any pain medication due to the fear she will vomit. Pt was told if she changes her mind to please hit her call bell. Writer also offered zofran  administration with pain medication administration to help. Pt advised she would hit call bell if she changes her mind.

## 2024-04-26 NOTE — ED Notes (Signed)
 Went into patients room to remove IV due to unable to use for CT due to infiltration. Patient stated she wanted someone else to stick her because I did it wrong I informed her I would try to find someone else.

## 2024-04-26 NOTE — Progress Notes (Signed)
 Patient ID: Stephanie [name], female   DOB: 01-19-1951, 74 y.o.   MRN: [id number] Request received for drainage of pelvic fluid collection in pt. Dr. Philip (IR)  reviewed images on this patient and he stated that fluid in region of concern is not really organized yet and would not recommend drain placement.  Would continue to treat conservatively at this time. For any additional questions please contact Dr. Philip on pager [phone] or via secure chat.

## 2024-04-26 NOTE — Progress Notes (Addendum)
 PROGRESS NOTE    Stephanie [name]  FMW:[id number] DOB: 19-Feb-1951 DOA: 04/25/2024 PCP: Joshua Debby CROME, MD   Brief Narrative:  Stephanie [name] is a 73 y.o. female with medical history significant of paroxysmal atrial fibrillation, gout, generalized anxiety disorder, essential hypertension, hyperlipidemia, HFrEF 40 to 45%, CKD 3A, non-insulin -dependent DM type II, anemia of chronic disease, asthma, chronic polyp, diverticulosis, presented to emergency department complaining of worsening abdominal pain for last 2 days and right-sided index finger pain with swelling and was diagnosed with acute diverticulitis with perirectal abscess.  General surgery consulted.  Hemodynamically stable upon arrival.  Assessment & Plan:   Principal Problem:   Rectal abscess Active Problems:   Acute diverticulitis   History of atrial fibrillation   Essential hypertension   Gout flare   Chronic systolic CHF (congestive heart failure) (HCC)   CKD stage 3a, GFR 45-59 ml/min (HCC)   Non-insulin  dependent type 2 diabetes mellitus (HCC)   QT prolongation  Prerectal abscess and acute diverticulitis, POA: CT abdomen pelvis showed evidence fo acute diverticulitis and prerectal abscess measuring 5.7 cm.  General Surgery Dr. Aron consulted, pending evaluation.  Continue Zosyn .    History of paroxysmal fibrillation and atrial flutter - Rates controlled.  Continue amiodarone  and Toprol -XL.  Holding Pradaxa  and instead patient on heparin  in case she needs any Jaeckel procedure.   QTc prolongation  QTc interval of 574 msec. Need to avoid any QT prolonging medications and monitor.  Repeat EKG in the morning.   Chronic systolic heart failure reduced EF 40 to 45% Essential hypertension - Appears euvolemic.  Continue Entresto  and Toprol -XL. -Holding Jardiance  and Lasix  given patient is NPO.  Currently on maintenance fluid 50 cc/h.   Hypokalemia - Low again, will replenish.   Non-insulin -dependent DM type II -  Holding Jardiance  as patient is NPO.  Starting on SSI.   Generalized anxiety disorder - Continue Xanax   CKD stage IIIa: Baseline creatinine around 1.  Currently at baseline.   Gout flare of the right second metacarpophalangeal joint Continue colchicine , ibuprofen  and Protonix .  Anemia of chronic disease: Stable.  DVT prophylaxis: SCDs Start: 04/26/24 0318   Code Status: Full Code  Family Communication:  None present at bedside.  Plan of care discussed with patient in length and he/she verbalized understanding and agreed with it.  Status is: Inpatient Remains inpatient appropriate because: Still with pain, needs surgical evaluation   Estimated body mass index is 44.82 kg/m as calculated from the following:   Height as of this encounter: 5' 3 (1.6 m).   Weight as of this encounter: 114.8 kg.    Nutritional Assessment: Body mass index is 44.82 kg/m.Stephanie [name] Seen by dietician.  I agree with the assessment and plan as outlined below: Nutrition Status:        . Skin Assessment: I have examined the patient's skin and I agree with the wound assessment as performed by the wound care RN as outlined below:    Consultants:  General surgery  Procedures:  None none  Antimicrobials:  Anti-infectives (From admission, onward)    Start     Dose/Rate Route Frequency Ordered Stop   04/26/24 1000  piperacillin -tazobactam (ZOSYN ) IVPB 3.375 g        3.375 g 12.5 mL/hr over 240 Minutes Intravenous Every 8 hours 04/26/24 0323     04/26/24 0230  piperacillin -tazobactam (ZOSYN ) IVPB 3.375 g        3.375 g 100 mL/hr over 30 Minutes Intravenous  Once 04/26/24 0226 04/26/24 0309  Subjective: Patient seen and examined, she complains of left lower quadrant abdominal pain however she says that it is improved.  No other complaint.  Objective: Vitals:   04/26/24 0600 04/26/24 0615 04/26/24 0654 04/26/24 0700  BP: (!) 173/82 (!) 190/81  (!) 154/61  Pulse: 73 76  78  Resp: 18 16  16    Temp:   98.4 F (36.9 C)   TempSrc:   Oral   SpO2: 93% 97%  96%  Weight:      Height:       No intake or output data in the 24 hours ending 04/26/24 0847 Filed Weights   04/25/24 2204  Weight: 114.8 kg    Examination:  General exam: Appears calm and comfortable  Respiratory system: Clear to auscultation. Respiratory effort normal. Cardiovascular system: S1 & S2 heard, RRR. No JVD, murmurs, rubs, gallops or clicks. No pedal edema. Gastrointestinal system: Abdomen is nondistended, soft and periumbilical as well as left lower quadrant tenderness, no organomegaly or masses felt. Normal bowel sounds heard. Central nervous system: Alert and oriented. No focal neurological deficits. Extremities: Symmetric 5 x 5 power. Skin: No rashes, lesions or ulcers Psychiatry: Judgement and insight appear normal. Mood & affect appropriate.    Data Reviewed: I have personally reviewed following labs and imaging studies  CBC: Recent Labs  Lab 04/25/24 2216 04/26/24 0419  WBC 8.1 7.9  HGB 11.2* 10.0*  HCT 35.5* 31.6*  MCV 95.4 94.6  PLT 229 203   Basic Metabolic Panel: Recent Labs  Lab 04/25/24 2216 04/26/24 0419  NA 141 138  K 3.4* 3.2*  CL 103 105  CO2 25 23  GLUCOSE 174* 129*  BUN 21 19  CREATININE 1.03* 1.07*  CALCIUM  9.5 8.7*   GFR: Estimated Creatinine Clearance: 58.9 mL/min (A) (by C-G formula based on SCr of 1.07 mg/dL (H)). Liver Function Tests: Recent Labs  Lab 04/25/24 2216 04/26/24 0419  AST 14* 13*  ALT 11 8  ALKPHOS 93 78  BILITOT 0.6 0.7  PROT 8.0 7.1  ALBUMIN 3.8 3.3*   Recent Labs  Lab 04/25/24 2216  LIPASE 32   No results for input(s): AMMONIA in the last 168 hours. Coagulation Profile: No results for input(s): INR, PROTIME in the last 168 hours. Cardiac Enzymes: No results for input(s): CKTOTAL, CKMB, CKMBINDEX, TROPONINI in the last 168 hours. BNP (last 3 results) No results for input(s): PROBNP in the last 8760  hours. HbA1C: No results for input(s): HGBA1C in the last 72 hours. CBG: Recent Labs  Lab 04/26/24 0646  GLUCAP 130*   Lipid Profile: No results for input(s): CHOL, HDL, LDLCALC, TRIG, CHOLHDL, LDLDIRECT in the last 72 hours. Thyroid  Function Tests: No results for input(s): TSH, T4TOTAL, FREET4, T3FREE, THYROIDAB in the last 72 hours. Anemia Panel: No results for input(s): VITAMINB12, FOLATE, FERRITIN, TIBC, IRON, RETICCTPCT in the last 72 hours. Sepsis Labs: No results for input(s): PROCALCITON, LATICACIDVEN in the last 168 hours.  Recent Results (from the past 240 hours)  Culture, blood (Routine X 2) w Reflex to ID Panel     Status: None (Preliminary result)   Collection Time: 04/26/24  4:14 AM   Specimen: BLOOD  Result Value Ref Range Status   Specimen Description   Final    BLOOD RIGHT ANTECUBITAL Performed at [facility], [address] [place], [place] [address]    Special Requests   Final    BOTTLES DRAWN AEROBIC AND ANAEROBIC Blood Culture results may not be optimal due  to an inadequate volume of blood received in culture bottles Performed at [facility], [address], [place], [place] [address]    Culture   Final    NO GROWTH < 12 HOURS Performed at [facility], [address], [place], [place] [address]    Report Status PENDING  Incomplete     Radiology Studies: DG Hand 2 View Right Result Date: 04/26/2024 CLINICAL DATA:  Swelling in the second through fifth metacarpals. EXAM: RIGHT HAND - 2 VIEW COMPARISON:  None Available. FINDINGS: Two-view exam limited by superimposition of bony anatomy on the lateral film. Within this limitation, no acute fracture or dislocation is evident. No worrisome lytic or sclerotic osseous abnormality. IMPRESSION: Negative. Electronically Signed   By: Camellia Candle M.D.   On: 04/26/2024 04:59   CT ABDOMEN PELVIS W CONTRAST Result Date:  04/26/2024 CLINICAL DATA:  Left lower quadrant abdominal pain starting 2 days ago. History of diverticulitis. EXAM: CT ABDOMEN AND PELVIS WITH CONTRAST TECHNIQUE: Multidetector CT imaging of the abdomen and pelvis was performed using the standard protocol following bolus administration of intravenous contrast. RADIATION DOSE REDUCTION: This exam was performed according to the departmental dose-optimization program which includes automated exposure control, adjustment of the mA and/or kV according to patient size and/or use of iterative reconstruction technique. CONTRAST:  OMNIPAQUE  IOHEXOL  300 MG/ML  SOLN COMPARISON:  10/14/2023 FINDINGS: Lower chest: Lung bases are clear. Hepatobiliary: No focal liver abnormality is seen. Status post cholecystectomy. No biliary dilatation. Pancreas: Unremarkable. No pancreatic ductal dilatation or surrounding inflammatory changes. Spleen: Normal in size without focal abnormality. Adrenals/Urinary Tract: Adrenal glands are unremarkable. Kidneys are normal, without renal calculi, focal lesion, or hydronephrosis. Bladder is unremarkable. Stomach/Bowel: Stomach, small bowel, and colon are not abnormally distended. Diverticulosis of the sigmoid colon. Pericolonic stranding with pericolonic fluid and edema consistent with acute diverticulitis. Loculated collection in the pre rectal space measuring 5.7 cm diameter consistent with abscess. Small amount of free fluid in the pelvis is likely reactive. Vascular/Lymphatic: No significant vascular findings are present. No enlarged abdominal or pelvic lymph nodes. Reproductive: Uterus is surgically absent. Ovaries are not enlarged. Other: No free air in the abdomen. Scarring along the midline anterior abdominal wall consistent with postoperative change. Omental focus of fat necrosis is also likely postoperative. Musculoskeletal: Degenerative changes in the spine. No acute bony abnormalities. IMPRESSION: 1. Diverticulosis of the sigmoid  colon with inflammatory changes consistent with acute diverticulitis. Pre rectal abscess measuring 5.7 cm. Free fluid in the pelvis is likely reactive. 2. Anterior abdominal wall scarring and omental fat necrosis likely postoperative. Electronically Signed   By: Elsie Gravely M.D.   On: 04/26/2024 02:22    Scheduled Meds:  amiodarone   200 mg Oral Daily   [START ON 04/27/2024] colchicine   0.6 mg Oral Daily   empagliflozin   25 mg Oral Daily   ibuprofen   200 mg Oral TID   metoprolol  succinate  50 mg Oral Daily   pantoprazole   40 mg Oral BID   potassium chloride   40 mEq Oral Once   sacubitril -valsartan   1 tablet Oral BID   simvastatin   10 mg Oral q1800   sodium chloride  flush  3 mL Intravenous Q12H   sodium chloride  flush  3 mL Intravenous Q12H   Continuous Infusions:  sodium chloride      heparin  1,200 Units/hr (04/26/24 0418)   lactated ringers  50 mL/hr at 04/26/24 0413   piperacillin -tazobactam (ZOSYN )  IV       LOS: 0  days   Fredia Skeeter, MD [facility]  04/26/2024, 8:47 AM  Total time spent 38 minutes *Please note that this is a verbal dictation therefore any spelling or grammatical errors are due to the Dragon Medical One system interpretation.  Please page via Amion and do not message via secure chat for urgent patient care matters. Secure chat can be used for non urgent patient care matters.  How to contact the [facility] Attending or Consulting provider 7A - 7P or covering provider during after hours 7P -7A, for this patient?  Check the care team in [facility] and look for a) attending/consulting [facility] provider listed and b) the [facility] team listed. Page or secure chat 7A-7P. Log into [email or web address] and use [place]'s universal password to access. If you do not have the password, please contact the hospital operator. Locate the [facility] provider you are looking for under [facility] and page to a number that you can be directly reached. If you still have difficulty reaching the  provider, please page the [facility] (Director on Call) for the Hospitalists listed on amion for assistance.

## 2024-04-26 NOTE — ED Notes (Signed)
 Patient at this time did not want the pain medication. Patient stated she would wait until it got worse. Patient is very cautious when it comes to taking medications.

## 2024-04-27 DIAGNOSIS — K611 Rectal abscess: Secondary | ICD-10-CM | POA: Diagnosis not present

## 2024-04-27 LAB — BASIC METABOLIC PANEL WITH GFR
Anion gap: 11 (ref 5–15)
BUN: 14 mg/dL (ref 8–23)
CO2: 24 mmol/L (ref 22–32)
Calcium: 9.1 mg/dL (ref 8.9–10.3)
Chloride: 104 mmol/L (ref 98–111)
Creatinine, Ser: 1.07 mg/dL — ABNORMAL HIGH (ref 0.44–1.00)
GFR, Estimated: 56 mL/min — ABNORMAL LOW (ref >60)
Glucose, Bld: 97 mg/dL (ref 70–99)
Potassium: 3.7 mmol/L (ref 3.5–5.1)
Sodium: 139 mmol/L (ref 135–145)

## 2024-04-27 LAB — GLUCOSE, CAPILLARY
Glucose-Capillary: 109 mg/dL — ABNORMAL HIGH (ref 70–99)
Glucose-Capillary: 109 mg/dL — ABNORMAL HIGH (ref 70–99)
Glucose-Capillary: 116 mg/dL — ABNORMAL HIGH (ref 70–99)
Glucose-Capillary: 97 mg/dL (ref 70–99)
Glucose-Capillary: 98 mg/dL (ref 70–99)
Glucose-Capillary: 99 mg/dL (ref 70–99)

## 2024-04-27 LAB — CBC
HCT: 31.2 % — ABNORMAL LOW (ref 36.0–46.0)
Hemoglobin: 9.9 g/dL — ABNORMAL LOW (ref 12.0–15.0)
MCH: 30.3 pg (ref 26.0–34.0)
MCHC: 31.7 g/dL (ref 30.0–36.0)
MCV: 95.4 fL (ref 80.0–100.0)
Platelets: 189 K/uL (ref 150–400)
RBC: 3.27 MIL/uL — ABNORMAL LOW (ref 3.87–5.11)
RDW: 13 % (ref 11.5–15.5)
WBC: 7.3 K/uL (ref 4.0–10.5)
nRBC: 0 % (ref 0.0–0.2)

## 2024-04-27 LAB — HEPARIN LEVEL (UNFRACTIONATED)
Heparin Unfractionated: 0.77 [IU]/mL — ABNORMAL HIGH (ref 0.30–0.70)
Heparin Unfractionated: 0.73 [IU]/mL — ABNORMAL HIGH (ref 0.30–0.70)

## 2024-04-27 NOTE — Progress Notes (Signed)
 PHARMACY - ANTICOAGULATION CONSULT NOTE  Pharmacy Consult for heparin   Indication: hx atrial fibrillation (PTA Pradaxa  on hold)  Allergies  Allergen Reactions   Amiodarone  Other (See Comments)    Tremor/gait disurbance   Aspirin  Other (See Comments)    Burns my stomach   Methimazole  Nausea And Vomiting   Propoxyphene Nausea And Vomiting   Trintellix  [Vortioxetine ] Nausea And Vomiting   Fentanyl  Nausea And Vomiting   Albuterol  Palpitations   Lipitor [Atorvastatin] Other (See Comments)    Body aches and pains--stiffness.    Livalo  [Pitavastatin ] Other (See Comments)    Body aches    Patient Measurements: Height: 5' 3 (160 cm) Weight: 114.8 kg (253 lb) IBW/kg (Calculated) : 52.4 HEPARIN  DW (KG): 80.3  Vital Signs: Temp: 97.9 F (36.6 C) (07/10 0407) BP: 143/59 (07/10 0407) Pulse Rate: 63 (07/10 0407)  Labs: Recent Labs    04/25/24 2216 04/26/24 0419 04/26/24 1153 04/26/24 1655 04/26/24 1907 04/27/24 0537  HGB 11.2* 10.0*  --   --   --  9.9*  HCT 35.5* 31.6*  --   --   --  31.2*  PLT 229 203  --   --   --  189  LABPROT  --   --   --  14.4  --   --   INR  --   --   --  1.1  --   --   HEPARINUNFRC  --   --  0.24*  --  0.53 0.77*  CREATININE 1.03* 1.07*  --   --   --   --     Estimated Creatinine Clearance: 58.9 mL/min (A) (by C-G formula based on SCr of 1.07 mg/dL (H)).   Medical History: Past Medical History:  Diagnosis Date   Anemia    Ankle fracture, right    x 2   Antineoplastic and immunosuppressive drugs causing adverse effect in therapeutic use    Asthma    Chronic venous hypertension without complications    Colon polyps 2009   Coronary artery disease    no CAD by cath 11.2010   Depressive disorder, not elsewhere classified    Diabetes mellitus type 2 in obese    hgb AIC 6.1 09/02/2009, insulin  dependent    Diastolic CHF, chronic (HCC)    reports stable   Diverticulosis of colon 2009   colonoscopy   Gout    cortisone  shots helped    Hepatitis, unspecified    hx of/per pt, never was told of having hepatitis   Hx of hysterectomy 2002   Hyperlipidemia    Hypertension    controlled on medication    Hypothyroidism    reports her thryoid levels are normal now    Irritable bowel syndrome    Kidney disease    Lung nodule    12mm RLL nodule on CT Chest  07/2009, resolved by 2011 CT   Noncompliance    Obesity    Persistent atrial fibrillation (HCC)    s/p DCCV 07/2009; Pradaxa  Rx, states she is in sinus rhythm now    PONV (postoperative nausea and vomiting)    after receiving too much anesthesia after a hernia surgery    Rapid atrial fibrillation (HCC) 10/06/2023   Sarcoidosis    dx by eye exam; seen during eye exam , report asymtptomatic i dont have it now'    Sleep disturbance 06/2013   sleep study, mild abnormality, no criteria for CPAP    Medications:  - on Pradaxa   150mg   bid PTA (last dose taken on 04/24/24)  Assessment: Patient is a 73 y.o F with hx afib on Pradaxa  PTA who presented to the ED on 04/25/24 with c/o abdominal pain. Abdominal CT showed acute diverticulitis with perirectal abscess.  Anticoag changed to heparin  drip in case invasive intervention is needed.    Today, 04/27/2024: Heparin  level now slightly supratherapeutic at 0.77 after rate increase yesterday. CBC stable. No s/s of bleeding.   Goal of Therapy:  Heparin  level 0.3-0.7 units/ml Monitor platelets by anticoagulation protocol: Yes   Plan:  Decrease heparin  drip to 1300 units/hr Check 6 hour heparin  level Monitor daily heparin  level, CBC, s/s of bleed  If invasive intervention is needed for pt, please advise if/when heparin  drip needs to be stopped prior to procedure  [place], PharmD, BCPS, BCIDP Clinical Pharmacist 04/27/2024 7:06 AM

## 2024-04-27 NOTE — Progress Notes (Signed)
 PHARMACY - ANTICOAGULATION CONSULT NOTE  Pharmacy Consult for Heparin  Indication: atrial fibrillation  Allergies  Allergen Reactions   Amiodarone  Other (See Comments)    Tremor/gait disurbance   Aspirin  Other (See Comments)    Burns my stomach   Methimazole  Nausea And Vomiting   Propoxyphene Nausea And Vomiting   Trintellix  [Vortioxetine ] Nausea And Vomiting   Fentanyl  Nausea And Vomiting   Albuterol  Palpitations   Lipitor [Atorvastatin] Other (See Comments)    Body aches and pains--stiffness.    Livalo  [Pitavastatin ] Other (See Comments)    Body aches    Patient Measurements: Height: 5' 3 (160 cm) Weight: 114.8 kg (253 lb) IBW/kg (Calculated) : 52.4 HEPARIN  DW (KG): 80.3  Vital Signs: Temp: 98 F (36.7 C) (07/10 0745) BP: 158/67 (07/10 1003) Pulse Rate: 66 (07/10 1003)  Labs: Recent Labs    04/25/24 2216 04/26/24 0419 04/26/24 1153 04/26/24 1655 04/26/24 1907 04/27/24 0537 04/27/24 1100 04/27/24 1509  HGB 11.2* 10.0*  --   --   --  9.9*  --   --   HCT 35.5* 31.6*  --   --   --  31.2*  --   --   PLT 229 203  --   --   --  189  --   --   LABPROT  --   --   --  14.4  --   --   --   --   INR  --   --   --  1.1  --   --   --   --   HEPARINUNFRC  --   --    < >  --  0.53 0.77*  --  0.73*  CREATININE 1.03* 1.07*  --   --   --   --  1.07*  --    < > = values in this interval not displayed.    Estimated Creatinine Clearance: 58.9 mL/min (A) (by C-G formula based on SCr of 1.07 mg/dL (H)).   Medical History: Past Medical History:  Diagnosis Date   Anemia    Ankle fracture, right    x 2   Antineoplastic and immunosuppressive drugs causing adverse effect in therapeutic use    Asthma    Chronic venous hypertension without complications    Colon polyps 2009   Coronary artery disease    no CAD by cath 11.2010   Depressive disorder, not elsewhere classified    Diabetes mellitus type 2 in obese    hgb AIC 6.1 09/02/2009, insulin  dependent    Diastolic CHF,  chronic (HCC)    reports stable   Diverticulosis of colon 2009   colonoscopy   Gout    cortisone  shots helped   Hepatitis, unspecified    hx of/per pt, never was told of having hepatitis   Hx of hysterectomy 2002   Hyperlipidemia    Hypertension    controlled on medication    Hypothyroidism    reports her thryoid levels are normal now    Irritable bowel syndrome    Kidney disease    Lung nodule    12mm RLL nodule on CT Chest  07/2009, resolved by 2011 CT   Noncompliance    Obesity    Persistent atrial fibrillation (HCC)    s/p DCCV 07/2009; Pradaxa  Rx, states she is in sinus rhythm now    PONV (postoperative nausea and vomiting)    after receiving too much anesthesia after a hernia surgery    Rapid atrial fibrillation (  HCC) 10/06/2023   Sarcoidosis    dx by eye exam; seen during eye exam , report asymtptomatic i dont have it now'    Sleep disturbance 06/2013   sleep study, mild abnormality, no criteria for CPAP    Assessment: AC/Heme: hx afib on pradaxa  PTA --> changed to heparin  drip in case I&D is needed for abscess - Hep level 0.77 down to 0.73 almost to goal.  Goal of Therapy:  Heparin  level 0.3-0.7 units/ml Monitor platelets by anticoagulation protocol: Yes   Plan:  - 7/10 PM: Decrease IV heparin  to 1150 units/hr Daily HL and CBC   Stephanie [name], PharmD, BCPS Clinical Staff Pharmacist  Stephanie [name] 04/27/2024,4:33 PM

## 2024-04-27 NOTE — Progress Notes (Signed)
 [facility] Progress Note     Subjective: CC: report SP and RLQ pain and pressure, stable compared to yesterday. Reports having a light brown BM yesterday after eating a popsicle. Denies vomiting.  Objective: Vital signs in last 24 hours: Temp:  [97.9 F (36.6 C)-99.1 F (37.3 C)] 98 F (36.7 C) (07/10 0745) Pulse Rate:  [63-80] 66 (07/10 0745) Resp:  [16-21] 16 (07/10 0407) BP: (143-175)/(52-72) 152/52 (07/10 0745) SpO2:  [96 %-100 %] 100 % (07/10 0745) Last BM Date : 04/26/24  Intake/Output from previous day: 07/09 0701 - 07/10 0700 In: 1173.4 [I.V.:1086.3; IV Piggyback:87.1] Out: -  Intake/Output this shift: No intake/output data recorded.  PE: Gen:  Alert, NAD, pleasant Card:  Regular rate and rhythm Pulm:  Normal effort ORA Abd: Soft, obese, mild TTP RLQ without guarding, no peritonitis.  Skin: warm and dry, no rashes  Psych: A&Ox3   Lab Results:  Recent Labs    04/26/24 0419 04/27/24 0537  WBC 7.9 7.3  HGB 10.0* 9.9*  HCT 31.6* 31.2*  PLT 203 189   BMET Recent Labs    04/25/24 2216 04/26/24 0419  NA 141 138  K 3.4* 3.2*  CL 103 105  CO2 25 23  GLUCOSE 174* 129*  BUN 21 19  CREATININE 1.03* 1.07*  CALCIUM  9.5 8.7*   PT/INR Recent Labs    04/26/24 1655  LABPROT 14.4  INR 1.1   CMP     Component Value Date/Time   NA 138 04/26/2024 0419   NA 144 12/16/2023 1212   NA 142 09/15/2016 1441   K 3.2 (L) 04/26/2024 0419   K 3.8 09/15/2016 1441   CL 105 04/26/2024 0419   CO2 23 04/26/2024 0419   CO2 28 09/15/2016 1441   GLUCOSE 129 (H) 04/26/2024 0419   GLUCOSE 196 (H) 09/15/2016 1441   BUN 19 04/26/2024 0419   BUN 19 12/16/2023 1212   BUN 31.4 (H) 09/15/2016 1441   CREATININE 1.07 (H) 04/26/2024 0419   CREATININE 1.02 (H) 11/18/2016 1440   CREATININE 1.3 (H) 09/15/2016 1441   CALCIUM  8.7 (L) 04/26/2024 0419   CALCIUM  10.2 09/15/2016 1441   PROT 7.1 04/26/2024 0419   PROT 7.2 12/16/2023 1212   PROT 8.3 09/15/2016 1441    ALBUMIN 3.3 (L) 04/26/2024 0419   ALBUMIN 4.2 12/16/2023 1212   ALBUMIN 3.7 09/15/2016 1441   AST 13 (L) 04/26/2024 0419   AST 19 09/15/2016 1441   ALT 8 04/26/2024 0419   ALT 20 09/15/2016 1441   ALKPHOS 78 04/26/2024 0419   ALKPHOS 95 09/15/2016 1441   BILITOT 0.7 04/26/2024 0419   BILITOT 0.3 12/16/2023 1212   BILITOT 0.47 09/15/2016 1441   GFRNONAA 56 (L) 04/26/2024 0419   GFRAA 43 (L) 12/09/2020 1413   Lipase     Component Value Date/Time   LIPASE 32 04/25/2024 2216       Studies/Results: DG Hand 2 View Right Result Date: 04/26/2024 CLINICAL DATA:  Swelling in the second through fifth metacarpals. EXAM: RIGHT HAND - 2 VIEW COMPARISON:  None Available. FINDINGS: Two-view exam limited by superimposition of bony anatomy on the lateral film. Within this limitation, no acute fracture or dislocation is evident. No worrisome lytic or sclerotic osseous abnormality. IMPRESSION: Negative. Electronically Signed   By: Camellia Candle M.D.   On: 04/26/2024 04:59   CT ABDOMEN PELVIS W CONTRAST Result Date: 04/26/2024 CLINICAL DATA:  Left lower quadrant abdominal pain starting 2 days ago. History of diverticulitis. EXAM: CT  ABDOMEN AND PELVIS WITH CONTRAST TECHNIQUE: Multidetector CT imaging of the abdomen and pelvis was performed using the standard protocol following bolus administration of intravenous contrast. RADIATION DOSE REDUCTION: This exam was performed according to the departmental dose-optimization program which includes automated exposure control, adjustment of the mA and/or kV according to patient size and/or use of iterative reconstruction technique. CONTRAST:  OMNIPAQUE  IOHEXOL  300 MG/ML  SOLN COMPARISON:  10/14/2023 FINDINGS: Lower chest: Lung bases are clear. Hepatobiliary: No focal liver abnormality is seen. Status post cholecystectomy. No biliary dilatation. Pancreas: Unremarkable. No pancreatic ductal dilatation or surrounding inflammatory changes. Spleen: Normal in size  without focal abnormality. Adrenals/Urinary Tract: Adrenal glands are unremarkable. Kidneys are normal, without renal calculi, focal lesion, or hydronephrosis. Bladder is unremarkable. Stomach/Bowel: Stomach, small bowel, and colon are not abnormally distended. Diverticulosis of the sigmoid colon. Pericolonic stranding with pericolonic fluid and edema consistent with acute diverticulitis. Loculated collection in the pre rectal space measuring 5.7 cm diameter consistent with abscess. Small amount of free fluid in the pelvis is likely reactive. Vascular/Lymphatic: No significant vascular findings are present. No enlarged abdominal or pelvic lymph nodes. Reproductive: Uterus is surgically absent. Ovaries are not enlarged. Other: No free air in the abdomen. Scarring along the midline anterior abdominal wall consistent with postoperative change. Omental focus of fat necrosis is also likely postoperative. Musculoskeletal: Degenerative changes in the spine. No acute bony abnormalities. IMPRESSION: 1. Diverticulosis of the sigmoid colon with inflammatory changes consistent with acute diverticulitis. Pre rectal abscess measuring 5.7 cm. Free fluid in the pelvis is likely reactive. 2. Anterior abdominal wall scarring and omental fat necrosis likely postoperative. Electronically Signed   By: Elsie Gravely M.D.   On: 04/26/2024 02:22    Anti-infectives: Anti-infectives (From admission, onward)    Start     Dose/Rate Route Frequency Ordered Stop   04/26/24 1000  piperacillin -tazobactam (ZOSYN ) IVPB 3.375 g        3.375 g 12.5 mL/hr over 240 Minutes Intravenous Every 8 hours 04/26/24 0323     04/26/24 0230  piperacillin -tazobactam (ZOSYN ) IVPB 3.375 g        3.375 g 100 mL/hr over 30 Minutes Intravenous  Once 04/26/24 0226 04/26/24 0309        Assessment/Plan  Diverticulitis with abscess, history of severe sigmoid stricture   - afebrile, VSS, WBC 7.3 - reviewed by IR who felt collection was not organized  enough for drainage; continue IV abx and follow clinically. Will ask IR to re-evaluate for drainage if patient is not improving with IV abx alone.   FEN - FLD, IVFs per primary VTE - heparin  gtt, Pradaxa  on hold ID - Zosyn    Diastolic HF - EF 59-53% DM HTN CKD HLD Gout GAD PAF - Pradaxa  on hold Chronic anemia Asthma  LOS: 1 day   I reviewed nursing notes, hospitalist notes, last 24 h vitals and pain scores, last 48 h intake and output, last 24 h labs and trends, and last 24 h imaging results.  This care required moderate level of medical decision making.   Almarie Pringle, PA-C [facility] Please see Amion for pager number during day hours 7:00am-4:30pm

## 2024-04-27 NOTE — Progress Notes (Signed)
 PROGRESS NOTE    Stephanie [name]  FMW:[id number] DOB: 01/25/1951 DOA: 04/25/2024 PCP: Joshua Debby CROME, MD   Brief Narrative:  Stephanie [name] is a 73 y.o. female with medical history significant of paroxysmal atrial fibrillation, gout, generalized anxiety disorder, essential hypertension, hyperlipidemia, HFrEF 40 to 45%, CKD 3A, non-insulin -dependent DM type II, anemia of chronic disease, asthma, chronic polyp, diverticulosis, presented to emergency department complaining of worsening abdominal pain for last 2 days and right-sided index finger pain with swelling and was diagnosed with acute diverticulitis with perirectal abscess.  General surgery consulted.  Hemodynamically stable upon arrival.  Assessment & Plan:   Principal Problem:   Rectal abscess Active Problems:   Acute diverticulitis   History of atrial fibrillation   Essential hypertension   Gout flare   Chronic systolic CHF (congestive heart failure) (HCC)   CKD stage 3a, GFR 45-59 ml/min (HCC)   Non-insulin  dependent type 2 diabetes mellitus (HCC)   QT prolongation  Prerectal abscess and acute diverticulitis, POA: CT abdomen pelvis showed evidence fo acute diverticulitis and prerectal abscess measuring 5.7 cm.  General Surgery following.  They did not recommend surgery.  They consulted IR for drainage by IR does not recommend drainage.  Patient remains on Zosyn .  Plan to manage with antibiotics only.  She has been started on full liquid diet per general surgery.  History of paroxysmal fibrillation and atrial flutter - Rates controlled.  Continue amiodarone  and Toprol -XL.  Holding Pradaxa  and instead patient on heparin  in case she needs any surgical procedure.   QTc prolongation  QTc interval of 574 msec. Need to avoid any QT prolonging medications and monitor.  Repeating EKG today.   Chronic systolic heart failure reduced EF 40 to 45% Essential hypertension - Appears euvolemic.  Continue Entresto  and Toprol -XL. - Continue  Jardiance  but holding Lasix  for now.   Hypokalemia - Finished yesterday, checking today.   Non-insulin -dependent DM type II - Continue Jardiance  and SSI.  Blood sugar controlled.   Generalized anxiety disorder - Continue Xanax   CKD stage IIIa: Baseline creatinine around 1.  Currently at baseline.   Gout flare of the right second metacarpophalangeal joint Continue colchicine , ibuprofen  and Protonix .  Anemia of chronic disease: Stable  Morbid obesity class III: BMI 44.  Weight loss and diet modification counseled.  DVT prophylaxis: SCDs Start: 04/26/24 0318   Code Status: Full Code  Family Communication:  None present at bedside.  Plan of care discussed with patient in length and he/she verbalized understanding and agreed with it.  Status is: Inpatient Remains inpatient appropriate because: Still with pain,    Estimated body mass index is 44.82 kg/m as calculated from the following:   Height as of this encounter: 5' 3 (1.6 m).   Weight as of this encounter: 114.8 kg.    Nutritional Assessment: Body mass index is 44.82 kg/m.SABRA Seen by dietician.  I agree with the assessment and plan as outlined below: Nutrition Status:        . Skin Assessment: I have examined the patient's skin and I agree with the wound assessment as performed by the wound care RN as outlined below:    Consultants:  General surgery IR Procedures:  None none  Antimicrobials:  Anti-infectives (From admission, onward)    Start     Dose/Rate Route Frequency Ordered Stop   04/26/24 1000  piperacillin -tazobactam (ZOSYN ) IVPB 3.375 g        3.375 g 12.5 mL/hr over 240 Minutes Intravenous Every 8 hours 04/26/24 0323  04/26/24 0230  piperacillin -tazobactam (ZOSYN ) IVPB 3.375 g        3.375 g 100 mL/hr over 30 Minutes Intravenous  Once 04/26/24 0226 04/26/24 0309         Subjective: Patient seen and examined.  Still complains of right lower quadrant pain but slightly better than  yesterday.  No other complaint.  Objective: Vitals:   04/26/24 1702 04/26/24 2148 04/27/24 0407 04/27/24 0745  BP: (!) 152/68 (!) 146/60 (!) 143/59 (!) 152/52  Pulse: 66 66 63 66  Resp:  20 16   Temp: 98.5 F (36.9 C) 98 F (36.7 C) 97.9 F (36.6 C) 98 F (36.7 C)  TempSrc:      SpO2: 100% 100% 100% 100%  Weight:      Height:        Intake/Output Summary (Last 24 hours) at 04/27/2024 0823 Last data filed at 04/27/2024 0318 Gross per 24 hour  Intake 1173.39 ml  Output --  Net 1173.39 ml   Filed Weights   04/25/24 2204  Weight: 114.8 kg    Examination:  General exam: Appears calm and comfortable, obese Respiratory system: Clear to auscultation. Respiratory effort normal. Cardiovascular system: S1 & S2 heard, RRR. No JVD, murmurs, rubs, gallops or clicks. No pedal edema. Gastrointestinal system: Abdomen is nondistended, soft and right lower quadrant tenderness. No organomegaly or masses felt. Normal bowel sounds heard. Central nervous system: Alert and oriented. No focal neurological deficits. Extremities: Symmetric 5 x 5 power. Skin: No rashes, lesions or ulcers.  Psychiatry: Judgement and insight appear normal. Mood & affect appropriate.   Data Reviewed: I have personally reviewed following labs and imaging studies  CBC: Recent Labs  Lab 04/25/24 2216 04/26/24 0419 04/27/24 0537  WBC 8.1 7.9 7.3  HGB 11.2* 10.0* 9.9*  HCT 35.5* 31.6* 31.2*  MCV 95.4 94.6 95.4  PLT 229 203 189   Basic Metabolic Panel: Recent Labs  Lab 04/25/24 2216 04/26/24 0419  NA 141 138  K 3.4* 3.2*  CL 103 105  CO2 25 23  GLUCOSE 174* 129*  BUN 21 19  CREATININE 1.03* 1.07*  CALCIUM  9.5 8.7*   GFR: Estimated Creatinine Clearance: 58.9 mL/min (A) (by C-G formula based on SCr of 1.07 mg/dL (H)). Liver Function Tests: Recent Labs  Lab 04/25/24 2216 04/26/24 0419  AST 14* 13*  ALT 11 8  ALKPHOS 93 78  BILITOT 0.6 0.7  PROT 8.0 7.1  ALBUMIN 3.8 3.3*   Recent Labs  Lab  04/25/24 2216  LIPASE 32   No results for input(s): AMMONIA in the last 168 hours. Coagulation Profile: Recent Labs  Lab 04/26/24 1655  INR 1.1   Cardiac Enzymes: No results for input(s): CKTOTAL, CKMB, CKMBINDEX, TROPONINI in the last 168 hours. BNP (last 3 results) No results for input(s): PROBNP in the last 8760 hours. HbA1C: No results for input(s): HGBA1C in the last 72 hours. CBG: Recent Labs  Lab 04/26/24 1157 04/26/24 2027 04/27/24 0002 04/27/24 0408 04/27/24 0734  GLUCAP 112* 94 99 109* 98   Lipid Profile: No results for input(s): CHOL, HDL, LDLCALC, TRIG, CHOLHDL, LDLDIRECT in the last 72 hours. Thyroid  Function Tests: No results for input(s): TSH, T4TOTAL, FREET4, T3FREE, THYROIDAB in the last 72 hours. Anemia Panel: No results for input(s): VITAMINB12, FOLATE, FERRITIN, TIBC, IRON, RETICCTPCT in the last 72 hours. Sepsis Labs: No results for input(s): PROCALCITON, LATICACIDVEN in the last 168 hours.  Recent Results (from the past 240 hours)  Culture, blood (Routine X 2) w  Reflex to ID Panel     Status: None (Preliminary result)   Collection Time: 04/26/24  4:14 AM   Specimen: BLOOD  Result Value Ref Range Status   Specimen Description   Final    BLOOD RIGHT ANTECUBITAL Performed at [facility], [address] [place], [place] [address]    Special Requests   Final    BOTTLES DRAWN AEROBIC AND ANAEROBIC Blood Culture results may not be optimal due to an inadequate volume of blood received in culture bottles Performed at [facility], [address], [place], [place] [address]    Culture   Final    NO GROWTH 1 DAY Performed at [facility], [address] [place], [place] [address]    Report Status PENDING  Incomplete  Culture, blood (Routine X 2) w Reflex to ID Panel     Status: None (Preliminary result)   Collection Time: 04/26/24  4:55 PM   Specimen:  BLOOD  Result Value Ref Range Status   Specimen Description   Final    BLOOD BLOOD LEFT HAND Performed at [facility], [address], [place], [place] [address]    Special Requests   Final    BOTTLES DRAWN AEROBIC AND ANAEROBIC Blood Culture adequate volume Performed at [facility], [address] [place], [place] [address]    Culture   Final    NO GROWTH < 24 HOURS Performed at [facility], [address], [place], [place] [address]    Report Status PENDING  Incomplete     Radiology Studies: DG Hand 2 View Right Result Date: 04/26/2024 CLINICAL DATA:  Swelling in the second through fifth metacarpals. EXAM: RIGHT HAND - 2 VIEW COMPARISON:  None Available. FINDINGS: Two-view exam limited by superimposition of bony anatomy on the lateral film. Within this limitation, no acute fracture or dislocation is evident. No worrisome lytic or sclerotic osseous abnormality. IMPRESSION: Negative. Electronically Signed   By: Camellia Candle M.D.   On: 04/26/2024 04:59   CT ABDOMEN PELVIS W CONTRAST Result Date: 04/26/2024 CLINICAL DATA:  Left lower quadrant abdominal pain starting 2 days ago. History of diverticulitis. EXAM: CT ABDOMEN AND PELVIS WITH CONTRAST TECHNIQUE: Multidetector CT imaging of the abdomen and pelvis was performed using the standard protocol following bolus administration of intravenous contrast. RADIATION DOSE REDUCTION: This exam was performed according to the departmental dose-optimization program which includes automated exposure control, adjustment of the mA and/or kV according to patient size and/or use of iterative reconstruction technique. CONTRAST:  OMNIPAQUE  IOHEXOL  300 MG/ML  SOLN COMPARISON:  10/14/2023 FINDINGS: Lower chest: Lung bases are clear. Hepatobiliary: No focal liver abnormality is seen. Status post cholecystectomy. No biliary dilatation. Pancreas: Unremarkable. No pancreatic ductal dilatation or surrounding  inflammatory changes. Spleen: Normal in size without focal abnormality. Adrenals/Urinary Tract: Adrenal glands are unremarkable. Kidneys are normal, without renal calculi, focal lesion, or hydronephrosis. Bladder is unremarkable. Stomach/Bowel: Stomach, small bowel, and colon are not abnormally distended. Diverticulosis of the sigmoid colon. Pericolonic stranding with pericolonic fluid and edema consistent with acute diverticulitis. Loculated collection in the pre rectal space measuring 5.7 cm diameter consistent with abscess. Small amount of free fluid in the pelvis is likely reactive. Vascular/Lymphatic: No significant vascular findings are present. No enlarged abdominal or pelvic lymph nodes. Reproductive: Uterus is surgically absent. Ovaries are not enlarged. Other: No free air in the abdomen. Scarring along the midline anterior abdominal wall consistent with postoperative change.  Omental focus of fat necrosis is also likely postoperative. Musculoskeletal: Degenerative changes in the spine. No acute bony abnormalities. IMPRESSION: 1. Diverticulosis of the sigmoid colon with inflammatory changes consistent with acute diverticulitis. Pre rectal abscess measuring 5.7 cm. Free fluid in the pelvis is likely reactive. 2. Anterior abdominal wall scarring and omental fat necrosis likely postoperative. Electronically Signed   By: Elsie Gravely M.D.   On: 04/26/2024 02:22    Scheduled Meds:  amiodarone   200 mg Oral Daily   colchicine   0.6 mg Oral Daily   empagliflozin   25 mg Oral Daily   ibuprofen   200 mg Oral TID   insulin  aspart  0-9 Units Subcutaneous Q4H   metoprolol  succinate  50 mg Oral Daily   pantoprazole   40 mg Oral BID   sacubitril -valsartan   1 tablet Oral BID   simvastatin   10 mg Oral q1800   sodium chloride  flush  3 mL Intravenous Q12H   sodium chloride  flush  3 mL Intravenous Q12H   Continuous Infusions:  heparin  1,300 Units/hr (04/27/24 0754)   piperacillin -tazobactam (ZOSYN )  IV 3.375 g  (04/27/24 0610)     LOS: 1 day   Fredia Skeeter, MD [facility]  04/27/2024, 8:23 AM  Total time spent 38 minutes *Please note that this is a verbal dictation therefore any spelling or grammatical errors are due to the Dragon [employer] One system interpretation.  Please page via Amion and do not message via secure chat for urgent patient care matters. Secure chat can be used for non urgent patient care matters.  How to contact the [facility] Attending or Consulting provider 7A - 7P or covering provider during after hours 7P -7A, for this patient?  Check the care team in [facility] and look for a) attending/consulting [facility] provider listed and b) the [facility] team listed. Page or secure chat 7A-7P. Log into [email or web address] and use [facility]'s universal password to access. If you do not have the password, please contact the hospital operator. Locate the [facility] provider you are looking for under [facility] and page to a number that you can be directly reached. If you still have difficulty reaching the provider, please page the [facility] (Director on Call) for the Hospitalists listed on amion for assistance.

## 2024-04-27 NOTE — TOC Initial Note (Signed)
 Transition of Care [facility]) - Initial/Assessment Note    Patient Details  Name: Stephanie [name] MRN: [id number] Date of Birth: Apr 18, 1951  Transition of Care [facility]) CM/SW Contact:    Sonda Manuella Quill, RN Phone Number: 04/27/2024, 2:35 PM  Clinical Narrative:                 Beatris w/ pt in room; pt says she lives at home; she plans to return at d/c; pt identified POC son Nailani Full [phone]); he will provide transportation; pt verified insurance/PCP; pt says she has difficulty paying utilities; she denied other SDOH risks; pt says she does not have DME, HH services, or home oxygen; she agreed to receive resources for social services, and financial assistance; resources placed in d/c instructions; copies of resources also given to pt; she will make her own appt at agencies of choice; TOC is following.  Expected Discharge Plan: Home/Self Care Barriers to Discharge: Continued Medical Work up   Patient Goals and CMS Choice Patient states their goals for this hospitalization and ongoing recovery are:: home CMS Medicare.gov Compare Post Acute Care list provided to:: Patient   Henry ownership interest in [facility]provided to:: Patient    Expected Discharge Plan and Services   Discharge Planning Services: CM Consult   Living arrangements for the past 2 months: Apartment                                      Prior Living Arrangements/Services Living arrangements for the past 2 months: Apartment Lives with:: Self Patient language and need for interpreter reviewed:: Yes Do you feel safe going back to the place where you live?: Yes      Need for Family Participation in Patient Care: Yes (Comment) Care giver support system in place?: Yes (comment) Current home services:  (n/a) Criminal Activity/Legal Involvement Pertinent to Current Situation/Hospitalization: No - Comment as needed  Activities of Daily Living   ADL Screening (condition at time of  admission) Independently performs ADLs?: Yes (appropriate for developmental age) Is the patient deaf or have difficulty hearing?: No Does the patient have difficulty seeing, even when wearing glasses/contacts?: No Does the patient have difficulty concentrating, remembering, or making decisions?: No  Permission Sought/Granted Permission sought to share information with : Case Manager Permission granted to share information with : Yes, Verbal Permission Granted  Share Information with NAME: Case Manager     Permission granted to share info w Relationship: Michaelah Credeur (son) [phone]     Emotional Assessment Appearance:: Appears stated age Attitude/Demeanor/Rapport: Gracious Affect (typically observed): Accepting Orientation: : Oriented to Self, Oriented to Place, Oriented to  Time, Oriented to Situation Alcohol / Substance Use: Not Applicable Psych Involvement: No (comment)  Admission diagnosis:  Rectal abscess [K61.1] Colonic diverticular abscess [K57.20] Patient Active Problem List   Diagnosis Date Noted   Rectal abscess 04/26/2024   History of atrial fibrillation 04/26/2024   QT prolongation 04/26/2024   Acute cough 12/02/2023   Chronic eczematous otitis externa of left ear 08/24/2023   Non-insulin  dependent type 2 diabetes mellitus (HCC) 04/20/2023   CKD stage 3a, GFR 45-59 ml/min (HCC) 11/11/2022   Full incontinence of feces 11/10/2022   Acute diverticulitis 06/19/2022   Picker's nodules 06/10/2022   Primary osteoarthritis involving multiple joints 06/10/2022   Dyslipidemia, goal LDL below 100 06/10/2022   Diuretic-induced hypokalemia 06/10/2022   Long term (current) use of insulin  (HCC)  03/18/2022   GAD (generalized anxiety disorder) 03/18/2022   Current severe episode of major depressive disorder without psychotic features without prior episode (HCC) 03/18/2022   Visit for screening mammogram 03/18/2022   Secondary hypercoagulable state (HCC) 09/04/2020   NICM  (nonischemic cardiomyopathy) (HCC) 12/20/2019   Benign neoplasm of ascending colon    Degenerative arthritis of knee, bilateral 12/22/2017   Typical atrial flutter (HCC)    Chronic systolic CHF (congestive heart failure) (HCC) 10/18/2016   B12 deficiency 05/06/2016   Other specified nutritional anemias 08/02/2015   Obesity 08/02/2015   Gout flare 08/02/2015   Cardiomyopathy, secondary (HCC) 01/28/2011   Chronic diastolic heart failure (HCC) 09/27/2009   Essential hypertension 09/02/2009   Atrial fibrillation (HCC) 08/20/2009   Lung nodule 08/20/2009   CHRONIC VENOUS HYPERTENSION WITHOUT COMPS 11/21/2007   PCP:  Joshua Debby CROME, MD Pharmacy:   [id number] [place] [place], [place] - [address] AT [facility] & [address] [place] [address] Phone: [phone] Fax: [phone]  [facility] [place] (New Address) - [place], [place] - [address] [facility] [address] [facility] AT Previously: [address], [place] [address] [facility] [address] 4th Floor [address] [place] [address] Phone: [phone] Fax: [phone]     Social Drivers of Health (SDOH) Social History: SDOH Screenings   Food Insecurity: No Food Insecurity (04/27/2024)  Housing: Low Risk  (04/27/2024)  Transportation Needs: No Transportation Needs (04/27/2024)  Utilities: At Risk (04/27/2024)  Alcohol Screen: Low Risk  (09/06/2023)  Depression (PHQ2-9): Medium Risk (09/06/2023)  Financial Resource Strain: Patient Declined (12/01/2023)  Recent Concern: Financial Resource Strain - Medium Risk (09/06/2023)  Physical Activity: Insufficiently Active (09/06/2023)  Social Connections: Unknown (04/26/2024)  Stress: Stress Concern Present (09/06/2023)  Tobacco Use: Low Risk  (04/26/2024)  Health Literacy: Adequate Health Literacy (09/06/2023)   SDOH Interventions: Food Insecurity Interventions: Intervention Not Indicated, Inpatient TOC Housing Interventions:  Intervention Not Indicated, Inpatient TOC Transportation Interventions: Intervention Not Indicated, Inpatient TOC Utilities Interventions: [employer] Provided, Inpatient TOC   Readmission Risk Interventions     No data to display

## 2024-04-28 ENCOUNTER — Inpatient Hospital Stay (HOSPITAL_COMMUNITY)

## 2024-04-28 DIAGNOSIS — K611 Rectal abscess: Secondary | ICD-10-CM | POA: Diagnosis not present

## 2024-04-28 DIAGNOSIS — Z8679 Personal history of other diseases of the circulatory system: Secondary | ICD-10-CM | POA: Diagnosis not present

## 2024-04-28 DIAGNOSIS — M109 Gout, unspecified: Secondary | ICD-10-CM

## 2024-04-28 DIAGNOSIS — N1831 Chronic kidney disease, stage 3a: Secondary | ICD-10-CM | POA: Diagnosis not present

## 2024-04-28 DIAGNOSIS — K5792 Diverticulitis of intestine, part unspecified, without perforation or abscess without bleeding: Secondary | ICD-10-CM | POA: Diagnosis not present

## 2024-04-28 LAB — GLUCOSE, CAPILLARY
Glucose-Capillary: 101 mg/dL — ABNORMAL HIGH (ref 70–99)
Glucose-Capillary: 103 mg/dL — ABNORMAL HIGH (ref 70–99)
Glucose-Capillary: 108 mg/dL — ABNORMAL HIGH (ref 70–99)
Glucose-Capillary: 112 mg/dL — ABNORMAL HIGH (ref 70–99)
Glucose-Capillary: 121 mg/dL — ABNORMAL HIGH (ref 70–99)
Glucose-Capillary: 129 mg/dL — ABNORMAL HIGH (ref 70–99)

## 2024-04-28 LAB — CBC
HCT: 29.2 % — ABNORMAL LOW (ref 36.0–46.0)
Hemoglobin: 9.3 g/dL — ABNORMAL LOW (ref 12.0–15.0)
MCH: 30.2 pg (ref 26.0–34.0)
MCHC: 31.8 g/dL (ref 30.0–36.0)
MCV: 94.8 fL (ref 80.0–100.0)
Platelets: 204 K/uL (ref 150–400)
RBC: 3.08 MIL/uL — ABNORMAL LOW (ref 3.87–5.11)
RDW: 12.8 % (ref 11.5–15.5)
WBC: 8 K/uL (ref 4.0–10.5)
nRBC: 0 % (ref 0.0–0.2)

## 2024-04-28 LAB — BASIC METABOLIC PANEL WITH GFR
Anion gap: 10 (ref 5–15)
BUN: 13 mg/dL (ref 8–23)
CO2: 22 mmol/L (ref 22–32)
Calcium: 9.1 mg/dL (ref 8.9–10.3)
Chloride: 106 mmol/L (ref 98–111)
Creatinine, Ser: 1.18 mg/dL — ABNORMAL HIGH (ref 0.44–1.00)
GFR, Estimated: 49 mL/min — ABNORMAL LOW (ref >60)
Glucose, Bld: 103 mg/dL — ABNORMAL HIGH (ref 70–99)
Potassium: 3.8 mmol/L (ref 3.5–5.1)
Sodium: 138 mmol/L (ref 135–145)

## 2024-04-28 LAB — HEPARIN LEVEL (UNFRACTIONATED): Heparin Unfractionated: 0.54 [IU]/mL (ref 0.30–0.70)

## 2024-04-28 MED ORDER — INSULIN ASPART 100 UNIT/ML IJ SOLN
0.0000 [IU] | Freq: Three times a day (TID) | INTRAMUSCULAR | Status: DC
  Administered 2024-04-28 – 2024-04-29 (×3): 1 [IU] via SUBCUTANEOUS
  Administered 2024-04-30: 2 [IU] via SUBCUTANEOUS

## 2024-04-28 MED ORDER — KETOROLAC TROMETHAMINE 15 MG/ML IJ SOLN
15.0000 mg | Freq: Three times a day (TID) | INTRAMUSCULAR | Status: AC | PRN
  Administered 2024-04-28 (×2): 15 mg via INTRAVENOUS
  Filled 2024-04-28 (×2): qty 1

## 2024-04-28 MED ORDER — HYDROMORPHONE HCL 1 MG/ML IJ SOLN
0.5000 mg | INTRAMUSCULAR | Status: DC | PRN
  Administered 2024-04-29: 1 mg via INTRAVENOUS
  Filled 2024-04-28 (×2): qty 1

## 2024-04-28 MED ORDER — HYDRALAZINE HCL 25 MG PO TABS
25.0000 mg | ORAL_TABLET | Freq: Three times a day (TID) | ORAL | Status: DC | PRN
  Administered 2024-04-30: 25 mg via ORAL
  Filled 2024-04-28: qty 1

## 2024-04-28 MED ORDER — ONDANSETRON HCL 4 MG/2ML IJ SOLN
4.0000 mg | Freq: Four times a day (QID) | INTRAMUSCULAR | Status: DC | PRN
  Administered 2024-04-29: 4 mg via INTRAVENOUS
  Filled 2024-04-28: qty 2

## 2024-04-28 MED ORDER — IOHEXOL 9 MG/ML PO SOLN
1000.0000 mL | ORAL | Status: AC
  Administered 2024-04-28: 1000 mL via ORAL

## 2024-04-28 MED ORDER — IOHEXOL 9 MG/ML PO SOLN
ORAL | Status: AC
  Filled 2024-04-28: qty 1000

## 2024-04-28 NOTE — Progress Notes (Signed)
 [facility]                                                                               Stephanie [name], is a 73 y.o. female, DOB - October 28, 1950, FMW:[id number] Admit date - 04/25/2024    Outpatient Primary MD for the patient is Joshua Debby CROME, MD  LOS - 2  days    Brief summary   Stephanie [name] is a 73 y.o. female with medical history significant of paroxysmal atrial fibrillation, gout, generalized anxiety disorder, essential hypertension, hyperlipidemia, HFrEF 40 to 45%, CKD 3A, non-insulin -dependent DM type II, anemia of chronic disease, asthma, chronic polyp, diverticulosis, presented to emergency department complaining of worsening abdominal pain for last 2 days and right-sided index finger pain with swelling and was diagnosed with acute diverticulitis with perirectal abscess.  General surgery consulted.  Hemodynamically stable upon arrival.    Assessment & Plan    Assessment and Plan:  Prerectal abscess and acute diverticulitis present on admission  CT abdomen pelvis showed evidence fo acute diverticulitis and prerectal abscess measuring 5.7 cm.  General Surgery following.   IR consulted for drainage.  Continue with IV antibiotics.     PAF Rate controlled with amiodarone  and toprol  XL.  On Heparin .     Chronic systolic CHF Continue with Entreso and Toprol  XL   Hypertension:  Suboptimally controlled.  Patient currently on Entresto , metoprolol  and hydralazine  prn.    Hypokalemia Replaced.    Normocytic anemia Hemoglobin around 9. Continue to monitor.    Type 2 DM CBG (last 3)  Recent Labs    04/28/24 0433 04/28/24 0748 04/28/24 1249  GLUCAP 103* 101* 121*   Resume SSI.    GAD Resume xanax .    Gout flare of the right second metacarpophalangeal joint.  On colchicine .   GERD Stable on protonix .    Stage 3 a CKD Slight worsening of creatinine from 1 to 1.1 Continue to monitor.    Body mass index is 44.82 kg/m. Weight  loss recommended.    Estimated body mass index is 44.82 kg/m as calculated from the following:   Height as of this encounter: 5' 3 (1.6 m).   Weight as of this encounter: 114.8 kg.  Code Status: full code.  DVT Prophylaxis:  SCDs Start: 04/26/24 0318   Level of Care: Level of care: Telemetry Family Communication: none at bedside.   Disposition Plan:     Remains inpatient appropriate:  pending clinical improvement.   Procedures:  None.   Consultants:   Gen surgery.   Antimicrobials:   Anti-infectives (From admission, onward)    Start     Dose/Rate Route Frequency Ordered Stop   04/26/24 1000  piperacillin -tazobactam (ZOSYN ) IVPB 3.375 g        3.375 g 12.5 mL/hr over 240 Minutes Intravenous Every 8 hours 04/26/24 0323     04/26/24 0230  piperacillin -tazobactam (ZOSYN ) IVPB 3.375 g        3.375 g 100 mL/hr over 30 Minutes Intravenous  Once 04/26/24 0226 04/26/24 0309        Medications  Scheduled Meds:  amiodarone   200 mg Oral Daily   colchicine   0.6 mg Oral Daily  empagliflozin   25 mg Oral Daily   insulin  aspart  0-9 Units Subcutaneous TID WC   metoprolol  succinate  50 mg Oral Daily   pantoprazole   40 mg Oral BID   sacubitril -valsartan   1 tablet Oral BID   simvastatin   10 mg Oral q1800   sodium chloride  flush  3 mL Intravenous Q12H   sodium chloride  flush  3 mL Intravenous Q12H   Continuous Infusions:  heparin  1,150 Units/hr (04/28/24 1520)   piperacillin -tazobactam (ZOSYN )  IV 3.375 g (04/28/24 1403)   PRN Meds:.acetaminophen  **OR** acetaminophen , ALPRAZolam , dextrose , HYDROmorphone  (DILAUDID ) injection, ketorolac , ondansetron  (ZOFRAN ) IV, sodium chloride  flush, trimethobenzamide     Subjective:   [name] was seen and examined today.  Nausea from pain meds.  No chest pain. No sob. Pain sub optimal.   Objective:   Vitals:   04/27/24 2109 04/28/24 0436 04/28/24 1013 04/28/24 1216  BP: (!) 165/73 (!) 152/60 (!) 168/58 (!) 158/120  Pulse: 64  69 68 66  Resp: 18 18 20    Temp: 98.2 F (36.8 C) 98.4 F (36.9 C) 98.4 F (36.9 C) 98 F (36.7 C)  TempSrc: Oral Oral Oral   SpO2: 100% 98% 99% 100%  Weight:      Height:       No intake or output data in the 24 hours ending 04/28/24 1524 Filed Weights   04/25/24 2204  Weight: 114.8 kg     Exam General exam: Appears calm and comfortable  Respiratory system: Clear to auscultation. Respiratory effort normal. Cardiovascular system: S1 & S2 heard, RRR. No JVD,  Gastrointestinal system: Abdomen is soft, mild gen tenderness. Bs+ Central nervous system: Alert and oriented. No focal neurological deficits. Extremities: Symmetric 5 x 5 power. Skin: No rashes,  Psychiatry: Mood & affect appropriate.    Data Reviewed:  I have personally reviewed following labs and imaging studies   CBC Lab Results  Component Value Date   WBC 8.0 04/28/2024   RBC 3.08 (L) 04/28/2024   HGB 9.3 (L) 04/28/2024   HCT 29.2 (L) 04/28/2024   MCV 94.8 04/28/2024   MCH 30.2 04/28/2024   PLT 204 04/28/2024   MCHC 31.8 04/28/2024   RDW 12.8 04/28/2024   LYMPHSABS 2.2 12/02/2023   MONOABS 0.5 12/02/2023   EOSABS 0.0 12/02/2023   BASOSABS 0.0 12/02/2023     Last metabolic panel Lab Results  Component Value Date   NA 138 04/28/2024   K 3.8 04/28/2024   CL 106 04/28/2024   CO2 22 04/28/2024   BUN 13 04/28/2024   CREATININE 1.18 (H) 04/28/2024   GLUCOSE 103 (H) 04/28/2024   GFRNONAA 49 (L) 04/28/2024   GFRAA 43 (L) 12/09/2020   CALCIUM  9.1 04/28/2024   PHOS 3.9 10/18/2016   PROT 7.1 04/26/2024   ALBUMIN 3.3 (L) 04/26/2024   LABGLOB 3.4 11/25/2017   AGRATIO 1.2 11/25/2017   BILITOT 0.7 04/26/2024   ALKPHOS 78 04/26/2024   AST 13 (L) 04/26/2024   ALT 8 04/26/2024   ANIONGAP 10 04/28/2024    CBG (last 3)  Recent Labs    04/28/24 0433 04/28/24 0748 04/28/24 1249  GLUCAP 103* 101* 121*      Coagulation Profile: Recent Labs  Lab 04/26/24 1655  INR 1.1     Radiology  Studies: No results found.     Elgie Butter M.D. [facility] 04/28/2024, 3:24 PM  Available via Epic secure chat 7am-7pm After 7 pm, please refer to night coverage provider listed on amion.

## 2024-04-28 NOTE — Plan of Care (Signed)

## 2024-04-28 NOTE — Progress Notes (Addendum)
 PHARMACY - ANTICOAGULATION CONSULT NOTE  Pharmacy Consult for Heparin  Indication: atrial fibrillation  Allergies  Allergen Reactions   Amiodarone  Other (See Comments)    Tremor/gait disurbance   Aspirin  Other (See Comments)    Burns my stomach   Methimazole  Nausea And Vomiting   Propoxyphene Nausea And Vomiting   Trintellix  [Vortioxetine ] Nausea And Vomiting   Fentanyl  Nausea And Vomiting   Albuterol  Palpitations   Lipitor [Atorvastatin] Other (See Comments)    Body aches and pains--stiffness.    Livalo  [Pitavastatin ] Other (See Comments)    Body aches    Patient Measurements: Height: 5' 3 (160 cm) Weight: 114.8 kg (253 lb) IBW/kg (Calculated) : 52.4 HEPARIN  DW (KG): 80.3  Vital Signs: Temp: 98.4 F (36.9 C) (07/11 0436) Temp Source: Oral (07/11 0436) BP: 152/60 (07/11 0436) Pulse Rate: 69 (07/11 0436)  Labs: Recent Labs    04/26/24 0419 04/26/24 1153 04/26/24 1655 04/26/24 1907 04/27/24 0537 04/27/24 1100 04/27/24 1509 04/28/24 0522  HGB 10.0*  --   --   --  9.9*  --   --  9.3*  HCT 31.6*  --   --   --  31.2*  --   --  29.2*  PLT 203  --   --   --  189  --   --  204  LABPROT  --   --  14.4  --   --   --   --   --   INR  --   --  1.1  --   --   --   --   --   HEPARINUNFRC  --    < >  --    < > 0.77*  --  0.73* 0.54  CREATININE 1.07*  --   --   --   --  1.07*  --  1.18*   < > = values in this interval not displayed.    Estimated Creatinine Clearance: 53.4 mL/min (A) (by C-G formula based on SCr of 1.18 mg/dL (H)).   Medical History: Past Medical History:  Diagnosis Date   Anemia    Ankle fracture, right    x 2   Antineoplastic and immunosuppressive drugs causing adverse effect in therapeutic use    Asthma    Chronic venous hypertension without complications    Colon polyps 2009   Coronary artery disease    no CAD by cath 11.2010   Depressive disorder, not elsewhere classified    Diabetes mellitus type 2 in obese    hgb AIC 6.1 09/02/2009,  insulin  dependent    Diastolic CHF, chronic (HCC)    reports stable   Diverticulosis of colon 2009   colonoscopy   Gout    cortisone  shots helped   Hepatitis, unspecified    hx of/per pt, never was told of having hepatitis   Hx of hysterectomy 2002   Hyperlipidemia    Hypertension    controlled on medication    Hypothyroidism    reports her thryoid levels are normal now    Irritable bowel syndrome    Kidney disease    Lung nodule    12mm RLL nodule on CT Chest  07/2009, resolved by 2011 CT   Noncompliance    Obesity    Persistent atrial fibrillation (HCC)    s/p DCCV 07/2009; Pradaxa  Rx, states she is in sinus rhythm now    PONV (postoperative nausea and vomiting)    after receiving too much anesthesia after a hernia surgery  Rapid atrial fibrillation (HCC) 10/06/2023   Sarcoidosis    dx by eye exam; seen during eye exam , report asymtptomatic i dont have it now'    Sleep disturbance 06/2013   sleep study, mild abnormality, no criteria for CPAP    Assessment: AC/Heme: hx afib on pradaxa  PTA --> changed to heparin  drip in case I&D is needed for abscess or colostomy. IR does not feel there is enough for drainage and surgery trying to advance diet. Heparin  level therapeutic this am at 0.54.  Goal of Therapy:  Heparin  level 0.3-0.7 units/ml Monitor platelets by anticoagulation protocol: Yes   Plan:  Continue IV heparin  at 1150 units/hr Monitor daily heparin  level, CBC, s/s of bleed  If tolerates diet advancement, consider transitioning back to dabigatran  if no surgery is needed.  ADDENDUM: Surgery plans to repeat CT scan on 7/12 to see if abscess is drainable. Heparin  to be turned off at 0500 in the morning.  Stephanie [name] 04/28/2024,7:46 AM

## 2024-04-28 NOTE — Progress Notes (Signed)
 [facility] Progress Note     Subjective: CC:  States her LLQ and RLQ abdominal pain are about the same, no better or worse. Denies nausea/vomiting. Reports soft, non-bloody stool today and yesterday.   Objective: Vital signs in last 24 hours: Temp:  [98.1 F (36.7 C)-98.4 F (36.9 C)] 98.4 F (36.9 C) (07/11 0436) Pulse Rate:  [59-69] 69 (07/11 0436) Resp:  [18] 18 (07/11 0436) BP: (152-165)/(60-73) 152/60 (07/11 0436) SpO2:  [98 %-100 %] 98 % (07/11 0436) Last BM Date : 04/26/24  Intake/Output from previous day: 07/10 0701 - 07/11 0700 In: 230 [I.V.:157.5; IV Piggyback:72.5] Out: -  Intake/Output this shift: No intake/output data recorded.  PE: Gen:  Alert, NAD, pleasant Card:  Regular rate and rhythm Pulm:  Normal effort ORA Abd: Soft, obese, mild subjective TTP RLQ AND LLQ, no guarding or no peritonitis.  Skin: warm and dry, no rashes  Psych: A&Ox3   Lab Results:  Recent Labs    04/27/24 0537 04/28/24 0522  WBC 7.3 8.0  HGB 9.9* 9.3*  HCT 31.2* 29.2*  PLT 189 204   BMET Recent Labs    04/27/24 1100 04/28/24 0522  NA 139 138  K 3.7 3.8  CL 104 106  CO2 24 22  GLUCOSE 97 103*  BUN 14 13  CREATININE 1.07* 1.18*  CALCIUM  9.1 9.1   PT/INR Recent Labs    04/26/24 1655  LABPROT 14.4  INR 1.1   CMP     Component Value Date/Time   NA 138 04/28/2024 0522   NA 144 12/16/2023 1212   NA 142 09/15/2016 1441   K 3.8 04/28/2024 0522   K 3.8 09/15/2016 1441   CL 106 04/28/2024 0522   CO2 22 04/28/2024 0522   CO2 28 09/15/2016 1441   GLUCOSE 103 (H) 04/28/2024 0522   GLUCOSE 196 (H) 09/15/2016 1441   BUN 13 04/28/2024 0522   BUN 19 12/16/2023 1212   BUN 31.4 (H) 09/15/2016 1441   CREATININE 1.18 (H) 04/28/2024 0522   CREATININE 1.02 (H) 11/18/2016 1440   CREATININE 1.3 (H) 09/15/2016 1441   CALCIUM  9.1 04/28/2024 0522   CALCIUM  10.2 09/15/2016 1441   PROT 7.1 04/26/2024 0419   PROT 7.2 12/16/2023 1212   PROT 8.3 09/15/2016 1441    ALBUMIN 3.3 (L) 04/26/2024 0419   ALBUMIN 4.2 12/16/2023 1212   ALBUMIN 3.7 09/15/2016 1441   AST 13 (L) 04/26/2024 0419   AST 19 09/15/2016 1441   ALT 8 04/26/2024 0419   ALT 20 09/15/2016 1441   ALKPHOS 78 04/26/2024 0419   ALKPHOS 95 09/15/2016 1441   BILITOT 0.7 04/26/2024 0419   BILITOT 0.3 12/16/2023 1212   BILITOT 0.47 09/15/2016 1441   GFRNONAA 49 (L) 04/28/2024 0522   GFRAA 43 (L) 12/09/2020 1413   Lipase     Component Value Date/Time   LIPASE 32 04/25/2024 2216       Studies/Results: No results found.   Anti-infectives: Anti-infectives (From admission, onward)    Start     Dose/Rate Route Frequency Ordered Stop   04/26/24 1000  piperacillin -tazobactam (ZOSYN ) IVPB 3.375 g        3.375 g 12.5 mL/hr over 240 Minutes Intravenous Every 8 hours 04/26/24 0323     04/26/24 0230  piperacillin -tazobactam (ZOSYN ) IVPB 3.375 g        3.375 g 100 mL/hr over 30 Minutes Intravenous  Once 04/26/24 0226 04/26/24 0309        Assessment/Plan  Diverticulitis with  5.7 cm abscess, history of severe sigmoid stricture   - afebrile, VSS, WBC 8.0 - reviewed by IR who felt collection was not organized enough for drainage; continue IV abx. - Plan repeat CT scan tomorrow to see if collection has become drainable. Will make NPO MN and hold heparin  gtt at 0500 tomorrow for possible IR consult/procedure based on CT results.  FEN - soft diet, IVFs per primary, NPO MN VTE - heparin  gtt, Pradaxa  on hold ID - Zosyn    Diastolic HF - EF 59-53% DM HTN CKD HLD Gout GAD PAF - Pradaxa  on hold Chronic anemia Asthma  LOS: 2 days   I reviewed nursing notes, hospitalist notes, last 24 h vitals and pain scores, last 48 h intake and output, last 24 h labs and trends, and last 24 h imaging results.  This care required moderate level of medical decision making.   Almarie Pringle, PA-C [facility] Please see Amion for pager number during day hours  7:00am-4:30pm

## 2024-04-28 NOTE — Plan of Care (Signed)
  Problem: Fluid Volume: Goal: Ability to maintain a balanced intake and output will improve Outcome: Progressing   Problem: Metabolic: Goal: Ability to maintain appropriate glucose levels will improve Outcome: Progressing   Problem: Nutritional: Goal: Maintenance of adequate nutrition will improve Outcome: Progressing   Problem: Education: Goal: Knowledge of General Education information will improve Description: Including pain rating scale, medication(s)/side effects and non-pharmacologic comfort measures Outcome: Progressing   Problem: Clinical Measurements: Goal: Ability to maintain clinical measurements within normal limits will improve Outcome: Progressing Goal: Diagnostic test results will improve Outcome: Progressing   Problem: Pain Managment: Goal: General experience of comfort will improve and/or be controlled Outcome: Progressing

## 2024-04-29 DIAGNOSIS — Z8679 Personal history of other diseases of the circulatory system: Secondary | ICD-10-CM | POA: Diagnosis not present

## 2024-04-29 DIAGNOSIS — N1831 Chronic kidney disease, stage 3a: Secondary | ICD-10-CM | POA: Diagnosis not present

## 2024-04-29 DIAGNOSIS — K5792 Diverticulitis of intestine, part unspecified, without perforation or abscess without bleeding: Secondary | ICD-10-CM | POA: Diagnosis not present

## 2024-04-29 DIAGNOSIS — K611 Rectal abscess: Secondary | ICD-10-CM | POA: Diagnosis not present

## 2024-04-29 LAB — GLUCOSE, CAPILLARY
Glucose-Capillary: 120 mg/dL — ABNORMAL HIGH (ref 70–99)
Glucose-Capillary: 126 mg/dL — ABNORMAL HIGH (ref 70–99)
Glucose-Capillary: 112 mg/dL — ABNORMAL HIGH (ref 70–99)
Glucose-Capillary: 118 mg/dL — ABNORMAL HIGH (ref 70–99)

## 2024-04-29 LAB — BASIC METABOLIC PANEL WITH GFR
Anion gap: 9 (ref 5–15)
BUN: 13 mg/dL (ref 8–23)
CO2: 23 mmol/L (ref 22–32)
Calcium: 9 mg/dL (ref 8.9–10.3)
Chloride: 106 mmol/L (ref 98–111)
Creatinine, Ser: 1.25 mg/dL — ABNORMAL HIGH (ref 0.44–1.00)
GFR, Estimated: 46 mL/min — ABNORMAL LOW (ref >60)
Glucose, Bld: 122 mg/dL — ABNORMAL HIGH (ref 70–99)
Potassium: 3.5 mmol/L (ref 3.5–5.1)
Sodium: 138 mmol/L (ref 135–145)

## 2024-04-29 LAB — CBC
HCT: 32.4 % — ABNORMAL LOW (ref 36.0–46.0)
Hemoglobin: 10.2 g/dL — ABNORMAL LOW (ref 12.0–15.0)
MCH: 30.2 pg (ref 26.0–34.0)
MCHC: 31.5 g/dL (ref 30.0–36.0)
MCV: 95.9 fL (ref 80.0–100.0)
Platelets: 209 K/uL (ref 150–400)
RBC: 3.38 MIL/uL — ABNORMAL LOW (ref 3.87–5.11)
RDW: 12.7 % (ref 11.5–15.5)
WBC: 5.6 K/uL (ref 4.0–10.5)
nRBC: 0 % (ref 0.0–0.2)

## 2024-04-29 LAB — HEPARIN LEVEL (UNFRACTIONATED): Heparin Unfractionated: 0.31 [IU]/mL (ref 0.30–0.70)

## 2024-04-29 MED ORDER — HYDROMORPHONE HCL 1 MG/ML IJ SOLN: 2.0000 mg | INTRAMUSCULAR | Status: DC | PRN

## 2024-04-29 MED ORDER — LACTATED RINGERS IV SOLN: INTRAVENOUS | Status: DC

## 2024-04-29 MED ORDER — HEPARIN (PORCINE) 25000 UT/250ML-% IV SOLN
1250.0000 [IU]/h | INTRAVENOUS | Status: DC
  Administered 2024-04-29 (×2): 1150 [IU]/h via INTRAVENOUS
  Administered 2024-04-30: 1250 [IU]/h via INTRAVENOUS
  Filled 2024-04-29 (×2): qty 250

## 2024-04-29 MED ORDER — OXYCODONE HCL 5 MG PO TABS: 5.0000 mg | ORAL_TABLET | ORAL | Status: DC | PRN

## 2024-04-29 MED ORDER — MORPHINE SULFATE (PF) 2 MG/ML IV SOLN: 0.5000 mg | INTRAVENOUS | Status: DC | PRN

## 2024-04-29 NOTE — Progress Notes (Signed)
 Subjective/Chief Complaint: Repeat ct done with questionable sigmoid wall abscess, some free fluid in pelvis and prerectal fluid collection She says feels better having flatus/bm   Objective: Vital signs in last 24 hours: Temp:  [98 F (36.7 C)-98.4 F (36.9 C)] 98.2 F (36.8 C) (07/12 0419) Pulse Rate:  [60-68] 62 (07/12 0419) Resp:  [18-20] 18 (07/12 0419) BP: (158-187)/(58-120) 182/66 (07/12 0419) SpO2:  [99 %-100 %] 100 % (07/12 0419) Last BM Date : 04/28/24  Intake/Output from previous day: 07/11 0701 - 07/12 0700 In: 663.1 [I.V.:435.6; IV Piggyback:227.5] Out: -  Intake/Output this shift: No intake/output data recorded.  Soft obese abdomen with llq tenderness  Lab Results:  Recent Labs    04/28/24 0522 04/29/24 0705  WBC 8.0 5.6  HGB 9.3* 10.2*  HCT 29.2* 32.4*  PLT 204 209   BMET Recent Labs    04/28/24 0522 04/29/24 0705  NA 138 138  K 3.8 3.5  CL 106 106  CO2 22 23  GLUCOSE 103* 122*  BUN 13 13  CREATININE 1.18* 1.25*  CALCIUM  9.1 9.0   PT/INR Recent Labs    04/26/24 1655  LABPROT 14.4  INR 1.1   ABG No results for input(s): PHART, HCO3 in the last 72 hours.  Invalid input(s): PCO2, PO2  Studies/Results: CT ABDOMEN PELVIS W CONTRAST Result Date: 04/28/2024 CLINICAL DATA:  Left lower quadrant pain EXAM: CT ABDOMEN AND PELVIS WITH CONTRAST TECHNIQUE: Multidetector CT imaging of the abdomen and pelvis was performed using the standard protocol following bolus administration of intravenous contrast. RADIATION DOSE REDUCTION: This exam was performed according to the departmental dose-optimization program which includes automated exposure control, adjustment of the mA and/or kV according to patient size and/or use of iterative reconstruction technique. CONTRAST:  OMNIPAQUE  IOHEXOL  300 MG/ML  SOLN COMPARISON:  None Available. FINDINGS: Lower chest: No acute abnormality. Hepatobiliary: No focal liver abnormality is seen. Status post  cholecystectomy. No biliary dilatation. Pancreas: Unremarkable. No pancreatic ductal dilatation or surrounding inflammatory changes. Spleen: Normal in size without focal abnormality. Adrenals/Urinary Tract: The the the there is scarring in the superior pole the left kidney. There is no hydronephrosis or perinephric fluid. The adrenal glands are within normal limits. There is wall thickening and inflammatory stranding of the bladder, particularly the dome of the bladder. Stomach/Bowel: Stomach is within normal limits. Appendix appears normal. Wall thickening of the sigmoid colon is unchanged. Scattered colonic diverticula in the sigmoid colon again noted. Questionable early abscess identified in the wall of the sigmoid colon image 2/64 measuring 2.2 x 1.1 by 1.6 cm. There is scattered sigmoid colon diverticulosis. Vascular/Lymphatic: Aorta and IVC are normal in size. No enlarged lymph nodes are seen. Reproductive: Uterus is surgically absent. The right ovary is prominent in size. Left ovary normal in size. There is stranding and fluid surrounding both ovaries. Other: There is a small amount of free fluid in the pelvis which appears with some peritoneal enhancement similar to the prior study. Pre rectal fluid collection measures 5.1 x 6.7 by 2.9 cm similar to the prior study. There is some stranding and fluid along the inferior retroperitoneum similar to the prior study. Musculoskeletal: Degenerative changes affect the spine. IMPRESSION: 1. Stable wall thickening of the sigmoid colon with diverticulosis. Findings may represent chronic diverticulitis. There is a questionable early abscess in the wall of the sigmoid colon measuring up to 2.2 cm. 2. Stable free fluid in the pelvis with peritoneal enhancement. 3. Stable pre rectal fluid collection measuring up to  6.7 cm concerning for abscess. 4. Stable inflammatory stranding and fluid along the inferior retroperitoneum. 5. Stable wall thickening and inflammatory  stranding of the bladder. Findings may be reactive or related to cystitis. 6. Stable prominent right ovary. Electronically Signed   By: Greig Pique M.D.   On: 04/28/2024 16:36    Anti-infectives: Anti-infectives (From admission, onward)    Start     Dose/Rate Route Frequency Ordered Stop   04/26/24 1000  piperacillin -tazobactam (ZOSYN ) IVPB 3.375 g        3.375 g 12.5 mL/hr over 240 Minutes Intravenous Every 8 hours 04/26/24 0323     04/26/24 0230  piperacillin -tazobactam (ZOSYN ) IVPB 3.375 g        3.375 g 100 mL/hr over 30 Minutes Intravenous  Once 04/26/24 0226 04/26/24 0309       Assessment/Plan:  Diverticulitis with 5.7 cm abscess, history of severe sigmoid stricture   - afebrile, VSS, WBC 8.0 - reviewed by IR who felt collection was not organized enough for drainage; continue IV abx. -has repeat ct that looks similar so doubt much will change but will ask IR to review -if not better may just need to consider surgery this admission   FEN - npo until IR gets back, if no plans to do this can put on diet VTE - heparin  gtt, Pradaxa  on hold ID - Zosyn    Diastolic HF - EF 59-53% DM HTN CKD HLD Gout GAD PAF - Pradaxa  on hold Chronic anemia Asthma  I reviewed hospitalist notes, last 24 h vitals and pain scores, last 48 h intake and output, last 24 h labs and trends, and last 24 h imaging results.   Stephanie [name] 04/29/2024

## 2024-04-29 NOTE — Plan of Care (Signed)
  Problem: Education: Goal: Ability to describe self-care measures that may prevent or decrease complications (Diabetes Survival Skills Education) will improve Outcome: Progressing   Problem: Metabolic: Goal: Ability to maintain appropriate glucose levels will improve Outcome: Progressing   Problem: Nutritional: Goal: Maintenance of adequate nutrition will improve Outcome: Progressing   Problem: Nutrition: Goal: Adequate nutrition will be maintained Outcome: Progressing   Problem: Elimination: Goal: Will not experience complications related to urinary retention Outcome: Progressing   Problem: Safety: Goal: Ability to remain free from injury will improve Outcome: Progressing

## 2024-04-29 NOTE — Plan of Care (Signed)
   Problem: Nutrition: Goal: Adequate nutrition will be maintained Outcome: Progressing   Problem: Pain Managment: Goal: General experience of comfort will improve and/or be controlled Outcome: Progressing   Problem: Safety: Goal: Ability to remain free from injury will improve Outcome: Progressing

## 2024-04-29 NOTE — Progress Notes (Incomplete)
 Pharmacy: Re- heparin   Patient is a 73 y.o F with hx afib on Pradaxa  PTA who presented to the ED on 04/25/24 with c/o abdominal pain. Abdominal CT showed acute diverticulitis with perirectal abscess.  Anticoag. changed to heparin  drip in case invasive intervention is needed.     Goal of Therapy:  Heparin  level 0.3-0.7 units/ml Monitor platelets by anticoagulation protocol: Yes  Plan: - heparin  drip -  -

## 2024-04-29 NOTE — Progress Notes (Signed)
 PHARMACY - ANTICOAGULATION CONSULT NOTE  Pharmacy Consult for Heparin  Indication: atrial fibrillation  Allergies  Allergen Reactions   Amiodarone  Other (See Comments)    Tremor/gait disurbance   Aspirin  Other (See Comments)    Burns my stomach   Methimazole  Nausea And Vomiting   Propoxyphene Nausea And Vomiting   Trintellix  [Vortioxetine ] Nausea And Vomiting   Fentanyl  Nausea And Vomiting   Albuterol  Palpitations   Lipitor [Atorvastatin] Other (See Comments)    Body aches and pains--stiffness.    Livalo  [Pitavastatin ] Other (See Comments)    Body aches    Patient Measurements: Height: 5' 3 (160 cm) Weight: 114.8 kg (253 lb) IBW/kg (Calculated) : 52.4 HEPARIN  DW (KG): 80.3  Vital Signs: Temp: 97.6 F (36.4 C) (07/12 1959) Temp Source: Oral (07/12 1959) BP: 159/60 (07/12 1959) Pulse Rate: 55 (07/12 1959)  Labs: Recent Labs    04/27/24 0537 04/27/24 1100 04/27/24 1509 04/28/24 0522 04/29/24 0705 04/29/24 2058  HGB 9.9*  --   --  9.3* 10.2*  --   HCT 31.2*  --   --  29.2* 32.4*  --   PLT 189  --   --  204 209  --   HEPARINUNFRC 0.77*  --  0.73* 0.54  --  0.31  CREATININE  --  1.07*  --  1.18* 1.25*  --     Estimated Creatinine Clearance: 50.4 mL/min (A) (by C-G formula based on SCr of 1.25 mg/dL (H)).   Medical History: Past Medical History:  Diagnosis Date   Anemia    Ankle fracture, right    x 2   Antineoplastic and immunosuppressive drugs causing adverse effect in therapeutic use    Asthma    Chronic venous hypertension without complications    Colon polyps 2009   Coronary artery disease    no CAD by cath 11.2010   Depressive disorder, not elsewhere classified    Diabetes mellitus type 2 in obese    hgb AIC 6.1 09/02/2009, insulin  dependent    Diastolic CHF, chronic (HCC)    reports stable   Diverticulosis of colon 2009   colonoscopy   Gout    cortisone  shots helped   Hepatitis, unspecified    hx of/per pt, never was told of having  hepatitis   Hx of hysterectomy 2002   Hyperlipidemia    Hypertension    controlled on medication    Hypothyroidism    reports her thryoid levels are normal now    Irritable bowel syndrome    Kidney disease    Lung nodule    12mm RLL nodule on CT Chest  07/2009, resolved by 2011 CT   Noncompliance    Obesity    Persistent atrial fibrillation (HCC)    s/p DCCV 07/2009; Pradaxa  Rx, states she is in sinus rhythm now    PONV (postoperative nausea and vomiting)    after receiving too much anesthesia after a hernia surgery    Rapid atrial fibrillation (HCC) 10/06/2023   Sarcoidosis    dx by eye exam; seen during eye exam , report asymtptomatic i dont have it now'    Sleep disturbance 06/2013   sleep study, mild abnormality, no criteria for CPAP    Assessment: AC/Heme: hx afib on pradaxa  PTA --> changed to heparin  drip in case I&D is needed for abscess or colostomy. IR does not feel there is enough for drainage and surgery trying to advance diet.   Today, 04/29/24 Holding off procedures this AM. Ok to reusme  heparin  now per Dr. Cherlyn  Hgb 10.2, stable Plt 209  Scr 1.25, CrCl 50 ml/min   2058 HL 0.31 low end of therapeutic  Goal of Therapy:  Heparin  level 0.3-0.7 units/ml Monitor platelets by anticoagulation protocol: Yes   Plan:  Increase heparin  drip slightly to 1250 units/hr HL in 8 hours Monitor daily heparin  level, CBC, s/s of bleed      Leeroy Mace RPh 04/29/2024, 11:06 PM

## 2024-04-29 NOTE — Progress Notes (Signed)
 [facility]                                                                               Stephanie [name], is a 73 y.o. female, DOB - 03-Feb-1951, FMW:[id number] Admit date - 04/25/2024    Outpatient Primary MD for the patient is Joshua Debby CROME, MD  LOS - 3  days    Brief summary   Stephanie [name] is a 73 y.o. female with medical history significant of paroxysmal atrial fibrillation, gout, generalized anxiety disorder, essential hypertension, hyperlipidemia, HFrEF 40 to 45%, CKD 3A, non-insulin -dependent DM type II, anemia of chronic disease, asthma, chronic polyp, diverticulosis, presented to emergency department complaining of worsening abdominal pain for last 2 days and right-sided index finger pain with swelling and was diagnosed with acute diverticulitis with perirectal abscess.  General surgery consulted.  Hemodynamically stable upon arrival.    Assessment & Plan    Assessment and Plan:  Prerectal abscess and acute diverticulitis present on admission  CT abdomen pelvis showed evidence fo acute diverticulitis and prerectal abscess measuring 5.7 cm.  General Surgery following.   IR consulted , no plan for drainage today.  Continue with IV antibiotics for now.     PAF Rate controlled with amiodarone  and toprol  XL.  On  iV Heparin . Pradaxa  is on hold for now.     Chronic systolic CHF She appears Euvolemic.  Continue with Entreso and Toprol  XL   Hypertension:  Better controlled.  Patient currently on Entresto , metoprolol  and hydralazine  prn.    Hypokalemia Replaced. Repeat levels wnl   Normocytic anemia Hemoglobin between 9 to 10. Continue to monitor.    Type 2 DM CBG (last 3)  Recent Labs    04/29/24 0743 04/29/24 1120 04/29/24 1649  GLUCAP 120* 126* 112*   Resume SSI.    GAD Resume xanax .    Gout flare of the right second metacarpophalangeal joint.  On colchicine .   GERD Stable on protonix .     Acute on Stage 3 a CKD Slight  worsening of creatinine from 1 to 1.1 to 1.2. Start the patient on IV fluids 75 ml/hr. Repeat BMP in am.  Continue to monitor.    Body mass index is 44.82 kg/m. Weight loss recommended.    Estimated body mass index is 44.82 kg/m as calculated from the following:   Height as of this encounter: 5' 3 (1.6 m).   Weight as of this encounter: 114.8 kg.  Code Status: full code.  DVT Prophylaxis:  SCDs Start: 04/26/24 0318   Level of Care: Level of care: Telemetry Family Communication: none at bedside.   Disposition Plan:     Remains inpatient appropriate:  pending clinical improvement.   Procedures:  None.   Consultants:   Gen surgery.   Antimicrobials:   Anti-infectives (From admission, onward)    Start     Dose/Rate Route Frequency Ordered Stop   04/26/24 1000  piperacillin -tazobactam (ZOSYN ) IVPB 3.375 g        3.375 g 12.5 mL/hr over 240 Minutes Intravenous Every 8 hours 04/26/24 0323     04/26/24 0230  piperacillin -tazobactam (ZOSYN ) IVPB 3.375 g  3.375 g 100 mL/hr over 30 Minutes Intravenous  Once 04/26/24 0226 04/26/24 0309        Medications  Scheduled Meds:  amiodarone   200 mg Oral Daily   colchicine   0.6 mg Oral Daily   empagliflozin   25 mg Oral Daily   insulin  aspart  0-9 Units Subcutaneous TID WC   metoprolol  succinate  50 mg Oral Daily   pantoprazole   40 mg Oral BID   sacubitril -valsartan   1 tablet Oral BID   simvastatin   10 mg Oral q1800   sodium chloride  flush  3 mL Intravenous Q12H   sodium chloride  flush  3 mL Intravenous Q12H   Continuous Infusions:  heparin  1,150 Units/hr (04/29/24 1241)   piperacillin -tazobactam (ZOSYN )  IV 3.375 g (04/29/24 1343)   PRN Meds:.acetaminophen  **OR** acetaminophen , ALPRAZolam , dextrose , hydrALAZINE , HYDROmorphone  (DILAUDID ) injection, ondansetron  (ZOFRAN ) IV, sodium chloride  flush, trimethobenzamide     Subjective:   Stephanie [name] was seen and examined today.  BP pressures are optimal. Pain is  slowly improving. Wants to know when she can go home.   Objective:   Vitals:   04/28/24 1216 04/28/24 2018 04/29/24 0419 04/29/24 1304  BP: (!) 158/120 (!) 187/78 (!) 182/66 (!) 143/63  Pulse: 66 60 62 65  Resp:  18 18 18   Temp: 98 F (36.7 C) 98 F (36.7 C) 98.2 F (36.8 C) 98.1 F (36.7 C)  TempSrc:  Oral Oral   SpO2: 100% 100% 100% 98%  Weight:      Height:        Intake/Output Summary (Last 24 hours) at 04/29/2024 1656 Last data filed at 04/29/2024 1600 Gross per 24 hour  Intake 750.99 ml  Output --  Net 750.99 ml   Filed Weights   04/25/24 2204  Weight: 114.8 kg     Exam General exam: Appears calm and comfortable  Respiratory system: Clear to auscultation. Respiratory effort normal. Cardiovascular system: S1 & S2 heard, RRR.  No pedal edema. Gastrointestinal system: Abdomen is soft, tenderness has improved.bs+ Central nervous system: Alert and oriented. No focal neurological deficits. Extremities: Symmetric 5 x 5 power. Skin: No rashes, lesions or ulcers Psychiatry:Mood & affect appropriate.    Data Reviewed:  I have personally reviewed following labs and imaging studies   CBC Lab Results  Component Value Date   WBC 5.6 04/29/2024   RBC 3.38 (L) 04/29/2024   HGB 10.2 (L) 04/29/2024   HCT 32.4 (L) 04/29/2024   MCV 95.9 04/29/2024   MCH 30.2 04/29/2024   PLT 209 04/29/2024   MCHC 31.5 04/29/2024   RDW 12.7 04/29/2024   LYMPHSABS 2.2 12/02/2023   MONOABS 0.5 12/02/2023   EOSABS 0.0 12/02/2023   BASOSABS 0.0 12/02/2023     Last metabolic panel Lab Results  Component Value Date   NA 138 04/29/2024   K 3.5 04/29/2024   CL 106 04/29/2024   CO2 23 04/29/2024   BUN 13 04/29/2024   CREATININE 1.25 (H) 04/29/2024   GLUCOSE 122 (H) 04/29/2024   GFRNONAA 46 (L) 04/29/2024   GFRAA 43 (L) 12/09/2020   CALCIUM  9.0 04/29/2024   PHOS 3.9 10/18/2016   PROT 7.1 04/26/2024   ALBUMIN 3.3 (L) 04/26/2024   LABGLOB 3.4 11/25/2017   AGRATIO 1.2 11/25/2017    BILITOT 0.7 04/26/2024   ALKPHOS 78 04/26/2024   AST 13 (L) 04/26/2024   ALT 8 04/26/2024   ANIONGAP 9 04/29/2024    CBG (last 3)  Recent Labs    04/29/24 0743 04/29/24 1120 04/29/24 1649  GLUCAP 120* 126* 112*      Coagulation Profile: Recent Labs  Lab 04/26/24 1655  INR 1.1     Radiology Studies: CT ABDOMEN PELVIS W CONTRAST Result Date: 04/28/2024 CLINICAL DATA:  Left lower quadrant pain EXAM: CT ABDOMEN AND PELVIS WITH CONTRAST TECHNIQUE: Multidetector CT imaging of the abdomen and pelvis was performed using the standard protocol following bolus administration of intravenous contrast. RADIATION DOSE REDUCTION: This exam was performed according to the departmental dose-optimization program which includes automated exposure control, adjustment of the mA and/or kV according to patient size and/or use of iterative reconstruction technique. CONTRAST:  OMNIPAQUE  IOHEXOL  300 MG/ML  SOLN COMPARISON:  None Available. FINDINGS: Lower chest: No acute abnormality. Hepatobiliary: No focal liver abnormality is seen. Status post cholecystectomy. No biliary dilatation. Pancreas: Unremarkable. No pancreatic ductal dilatation or surrounding inflammatory changes. Spleen: Normal in size without focal abnormality. Adrenals/Urinary Tract: The the the there is scarring in the superior pole the left kidney. There is no hydronephrosis or perinephric fluid. The adrenal glands are within normal limits. There is wall thickening and inflammatory stranding of the bladder, particularly the dome of the bladder. Stomach/Bowel: Stomach is within normal limits. Appendix appears normal. Wall thickening of the sigmoid colon is unchanged. Scattered colonic diverticula in the sigmoid colon again noted. Questionable early abscess identified in the wall of the sigmoid colon image 2/64 measuring 2.2 x 1.1 by 1.6 cm. There is scattered sigmoid colon diverticulosis. Vascular/Lymphatic: Aorta and IVC are normal in size.  No enlarged lymph nodes are seen. Reproductive: Uterus is surgically absent. The right ovary is prominent in size. Left ovary normal in size. There is stranding and fluid surrounding both ovaries. Other: There is a small amount of free fluid in the pelvis which appears with some peritoneal enhancement similar to the prior study. Pre rectal fluid collection measures 5.1 x 6.7 by 2.9 cm similar to the prior study. There is some stranding and fluid along the inferior retroperitoneum similar to the prior study. Musculoskeletal: Degenerative changes affect the spine. IMPRESSION: 1. Stable wall thickening of the sigmoid colon with diverticulosis. Findings may represent chronic diverticulitis. There is a questionable early abscess in the wall of the sigmoid colon measuring up to 2.2 cm. 2. Stable free fluid in the pelvis with peritoneal enhancement. 3. Stable pre rectal fluid collection measuring up to 6.7 cm concerning for abscess. 4. Stable inflammatory stranding and fluid along the inferior retroperitoneum. 5. Stable wall thickening and inflammatory stranding of the bladder. Findings may be reactive or related to cystitis. 6. Stable prominent right ovary. Electronically Signed   By: Greig Pique M.D.   On: 04/28/2024 16:36       Elgie Butter M.D. [facility] 04/29/2024, 4:56 PM  Available via Epic secure chat 7am-7pm After 7 pm, please refer to night coverage provider listed on amion.

## 2024-04-29 NOTE — Progress Notes (Addendum)
 PHARMACY - ANTICOAGULATION CONSULT NOTE  Pharmacy Consult for Heparin  Indication: atrial fibrillation  Allergies  Allergen Reactions   Amiodarone  Other (See Comments)    Tremor/gait disurbance   Aspirin  Other (See Comments)    Burns my stomach   Methimazole  Nausea And Vomiting   Propoxyphene Nausea And Vomiting   Trintellix  [Vortioxetine ] Nausea And Vomiting   Fentanyl  Nausea And Vomiting   Albuterol  Palpitations   Lipitor [Atorvastatin] Other (See Comments)    Body aches and pains--stiffness.    Livalo  [Pitavastatin ] Other (See Comments)    Body aches    Patient Measurements: Height: 5' 3 (160 cm) Weight: 114.8 kg (253 lb) IBW/kg (Calculated) : 52.4 HEPARIN  DW (KG): 80.3  Vital Signs: Temp: 98.2 F (36.8 C) (07/12 0419) Temp Source: Oral (07/12 0419) BP: 182/66 (07/12 0419) Pulse Rate: 62 (07/12 0419)  Labs: Recent Labs    04/26/24 1655 04/26/24 1907 04/27/24 0537 04/27/24 1100 04/27/24 1509 04/28/24 0522 04/29/24 0705  HGB  --    < > 9.9*  --   --  9.3* 10.2*  HCT  --   --  31.2*  --   --  29.2* 32.4*  PLT  --   --  189  --   --  204 209  LABPROT 14.4  --   --   --   --   --   --   INR 1.1  --   --   --   --   --   --   HEPARINUNFRC  --    < > 0.77*  --  0.73* 0.54  --   CREATININE  --   --   --  1.07*  --  1.18* 1.25*   < > = values in this interval not displayed.    Estimated Creatinine Clearance: 50.4 mL/min (A) (by C-G formula based on SCr of 1.25 mg/dL (H)).   Medical History: Past Medical History:  Diagnosis Date   Anemia    Ankle fracture, right    x 2   Antineoplastic and immunosuppressive drugs causing adverse effect in therapeutic use    Asthma    Chronic venous hypertension without complications    Colon polyps 2009   Coronary artery disease    no CAD by cath 11.2010   Depressive disorder, not elsewhere classified    Diabetes mellitus type 2 in obese    hgb AIC 6.1 09/02/2009, insulin  dependent    Diastolic CHF, chronic (HCC)     reports stable   Diverticulosis of colon 2009   colonoscopy   Gout    cortisone  shots helped   Hepatitis, unspecified    hx of/per pt, never was told of having hepatitis   Hx of hysterectomy 2002   Hyperlipidemia    Hypertension    controlled on medication    Hypothyroidism    reports her thryoid levels are normal now    Irritable bowel syndrome    Kidney disease    Lung nodule    12mm RLL nodule on CT Chest  07/2009, resolved by 2011 CT   Noncompliance    Obesity    Persistent atrial fibrillation (HCC)    s/p DCCV 07/2009; Pradaxa  Rx, states she is in sinus rhythm now    PONV (postoperative nausea and vomiting)    after receiving too much anesthesia after a hernia surgery    Rapid atrial fibrillation (HCC) 10/06/2023   Sarcoidosis    dx by eye exam; seen during eye exam ,  report asymtptomatic i dont have it now'    Sleep disturbance 06/2013   sleep study, mild abnormality, no criteria for CPAP    Assessment: AC/Heme: hx afib on pradaxa  PTA --> changed to heparin  drip in case I&D is needed for abscess or colostomy. IR does not feel there is enough for drainage and surgery trying to advance diet.   Today, 04/29/24 Holding off procedures this AM. Ok to reusme heparin  now per Dr. Cherlyn  Hgb 10.2, stable Plt 209  Scr 1.25, CrCl 50 ml/min   Goal of Therapy:  Heparin  level 0.3-0.7 units/ml Monitor platelets by anticoagulation protocol: Yes   Plan:  Resume  IV heparin  at 1150 units/hr Obtain HL 8 hours after start of infusion Monitor daily heparin  level, CBC, s/s of bleed       Dolphus Roller, PharmD, BCPS 04/29/2024 11:25 AM

## 2024-04-30 DIAGNOSIS — K5792 Diverticulitis of intestine, part unspecified, without perforation or abscess without bleeding: Secondary | ICD-10-CM | POA: Diagnosis not present

## 2024-04-30 DIAGNOSIS — Z8679 Personal history of other diseases of the circulatory system: Secondary | ICD-10-CM | POA: Diagnosis not present

## 2024-04-30 DIAGNOSIS — K611 Rectal abscess: Secondary | ICD-10-CM | POA: Diagnosis not present

## 2024-04-30 DIAGNOSIS — N1831 Chronic kidney disease, stage 3a: Secondary | ICD-10-CM | POA: Diagnosis not present

## 2024-04-30 LAB — GLUCOSE, CAPILLARY
Glucose-Capillary: 97 mg/dL (ref 70–99)
Glucose-Capillary: 93 mg/dL (ref 70–99)
Glucose-Capillary: 162 mg/dL — ABNORMAL HIGH (ref 70–99)
Glucose-Capillary: 106 mg/dL — ABNORMAL HIGH (ref 70–99)

## 2024-04-30 LAB — BASIC METABOLIC PANEL WITH GFR
Anion gap: 10 (ref 5–15)
BUN: 12 mg/dL (ref 8–23)
CO2: 24 mmol/L (ref 22–32)
Calcium: 9.5 mg/dL (ref 8.9–10.3)
Chloride: 104 mmol/L (ref 98–111)
Creatinine, Ser: 1.14 mg/dL — ABNORMAL HIGH (ref 0.44–1.00)
GFR, Estimated: 51 mL/min — ABNORMAL LOW (ref >60)
Glucose, Bld: 93 mg/dL (ref 70–99)
Potassium: 3.6 mmol/L (ref 3.5–5.1)
Sodium: 138 mmol/L (ref 135–145)

## 2024-04-30 LAB — CBC
HCT: 32 % — ABNORMAL LOW (ref 36.0–46.0)
Hemoglobin: 10.1 g/dL — ABNORMAL LOW (ref 12.0–15.0)
MCH: 29.9 pg (ref 26.0–34.0)
MCHC: 31.6 g/dL (ref 30.0–36.0)
MCV: 94.7 fL (ref 80.0–100.0)
Platelets: 245 K/uL (ref 150–400)
RBC: 3.38 MIL/uL — ABNORMAL LOW (ref 3.87–5.11)
RDW: 12.6 % (ref 11.5–15.5)
WBC: 6 K/uL (ref 4.0–10.5)
nRBC: 0 % (ref 0.0–0.2)

## 2024-04-30 LAB — HEPARIN LEVEL (UNFRACTIONATED): Heparin Unfractionated: 0.45 [IU]/mL (ref 0.30–0.70)

## 2024-04-30 MED ORDER — IBUPROFEN 200 MG PO TABS
200.0000 mg | ORAL_TABLET | Freq: Once | ORAL | Status: AC
  Administered 2024-04-30: 200 mg via ORAL
  Filled 2024-04-30: qty 1

## 2024-04-30 MED ORDER — IBUPROFEN 200 MG PO TABS
200.0000 mg | ORAL_TABLET | ORAL | Status: DC | PRN
  Administered 2024-04-30: 200 mg via ORAL
  Filled 2024-04-30: qty 1

## 2024-04-30 MED ORDER — HYDRALAZINE HCL 20 MG/ML IJ SOLN
10.0000 mg | Freq: Four times a day (QID) | INTRAMUSCULAR | Status: DC | PRN
  Administered 2024-04-30: 10 mg via INTRAVENOUS
  Filled 2024-04-30: qty 1

## 2024-04-30 MED ORDER — KETOROLAC TROMETHAMINE 15 MG/ML IJ SOLN
15.0000 mg | Freq: Four times a day (QID) | INTRAMUSCULAR | Status: DC | PRN
  Administered 2024-04-30: 15 mg via INTRAVENOUS
  Filled 2024-04-30: qty 1

## 2024-04-30 NOTE — Progress Notes (Signed)
 [facility]                                                                               Stephanie [name], is a 73 y.o. female, DOB - 1950-11-23, FMW:[id number] Admit date - 04/25/2024    Outpatient Primary MD for the patient is Stephanie Debby CROME, MD  LOS - 4  days    Brief summary   Stephanie [name] is a 73 y.o. female with medical history significant of paroxysmal atrial fibrillation, gout, generalized anxiety disorder, essential hypertension, hyperlipidemia, HFrEF 40 to 45%, CKD 3A, non-insulin -dependent DM type II, anemia of chronic disease, asthma, chronic polyp, diverticulosis, presented to emergency department complaining of worsening abdominal pain for last 2 days and right-sided index finger pain with swelling and was diagnosed with acute diverticulitis with perirectal abscess.  General surgery consulted.  Hemodynamically stable upon arrival.    Assessment & Plan    Assessment and Plan:  Prerectal abscess and acute diverticulitis present on admission  CT abdomen pelvis showed evidence fo acute diverticulitis and prerectal abscess measuring 5.7 cm.  General Surgery following.   No plans for surgical intervention today or tomorrow. Continue with conservative management with IV antibiotics and slowly advancing diet with tolerance.  Patient had one episode of vomiting last night, which is probably from IV morphine .     PAF Rate controlled with amiodarone  and toprol  XL.  On  iV Heparin . Pradaxa  is on hold for now.     Chronic systolic CHF She appears Euvolemic.  Continue with Entreso and Toprol  XL   Hypertension:  Suboptimally controlled. D/c IV fluids and recheck BP later this evening.  Patient currently on Entresto , metoprolol  and hydralazine  prn.    Hypokalemia Replaced. Repeat levels wnl   Normocytic anemia Hemoglobin between 9 to 10. Continue to monitor.    Type 2 DM CBG (last 3)  Recent Labs    04/30/24 0736 04/30/24 1119 04/30/24 1609   GLUCAP 97 93 162*   Resume SSI.    GAD Resume xanax .    Gout flare of the right second metacarpophalangeal joint.  On colchicine .   GERD Stable on protonix .     Acute on Stage 3 a CKD Slight worsening of creatinine from 1 to 1.1 to 1.2. Start the patient on IV fluids 75 ml/hr. Repeat BMP in am.  Continue to monitor.    Body mass index is 44.82 kg/m. Weight loss recommended.    Estimated body mass index is 44.82 kg/m as calculated from the following:   Height as of this encounter: 5' 3 (1.6 m).   Weight as of this encounter: 114.8 kg.  Code Status: full code.  DVT Prophylaxis:  SCDs Start: 04/26/24 0318   Level of Care: Level of care: Telemetry Family Communication: none at bedside.   Disposition Plan:     Remains inpatient appropriate:  pending clinical improvement.   Procedures:  None.   Consultants:   Gen surgery.   Antimicrobials:   Anti-infectives (From admission, onward)    Start     Dose/Rate Route Frequency Ordered Stop   04/26/24 1000  piperacillin -tazobactam (ZOSYN ) IVPB 3.375 g        3.375 g  12.5 mL/hr over 240 Minutes Intravenous Every 8 hours 04/26/24 0323     04/26/24 0230  piperacillin -tazobactam (ZOSYN ) IVPB 3.375 g        3.375 g 100 mL/hr over 30 Minutes Intravenous  Once 04/26/24 0226 04/26/24 0309        Medications  Scheduled Meds:  amiodarone   200 mg Oral Daily   colchicine   0.6 mg Oral Daily   empagliflozin   25 mg Oral Daily   insulin  aspart  0-9 Units Subcutaneous TID WC   metoprolol  succinate  50 mg Oral Daily   pantoprazole   40 mg Oral BID   sacubitril -valsartan   1 tablet Oral BID   simvastatin   10 mg Oral q1800   sodium chloride  flush  3 mL Intravenous Q12H   sodium chloride  flush  3 mL Intravenous Q12H   Continuous Infusions:  heparin  1,250 Units/hr (04/30/24 1534)   lactated ringers  75 mL/hr at 04/30/24 1534   piperacillin -tazobactam (ZOSYN )  IV 12.5 mL/hr at 04/30/24 1534   PRN Meds:.acetaminophen   **OR** acetaminophen , ALPRAZolam , dextrose , hydrALAZINE , ibuprofen , ketorolac , morphine  injection, ondansetron  (ZOFRAN ) IV, oxyCODONE , sodium chloride  flush, trimethobenzamide     Subjective:   Stephanie [name] was seen and examined today.  Reports having one episode of vomiting yesterday after IV morphine .  No nausea today. Still has some pain.   Objective:   Vitals:   04/29/24 1959 04/30/24 0426 04/30/24 0928 04/30/24 1606  BP: (!) 159/60 (!) 147/65 (!) 164/67 (!) 172/79  Pulse: (!) 55 63 60 (!) 58  Resp: 18 18 18 16   Temp: 97.6 F (36.4 C) 97.6 F (36.4 C)  (!) 97.5 F (36.4 C)  TempSrc: Oral Oral Oral   SpO2: 95% 97% 98% 100%  Weight:      Height:        Intake/Output Summary (Last 24 hours) at 04/30/2024 1756 Last data filed at 04/30/2024 1534 Gross per 24 hour  Intake 2238.02 ml  Output --  Net 2238.02 ml   Filed Weights   04/25/24 2204  Weight: 114.8 kg     Exam General exam: Appears calm and comfortable  Respiratory system: Clear to auscultation. Respiratory effort normal. Cardiovascular system: S1 & S2 heard, RRR. No JVD,  Gastrointestinal system: Abdomen is soft, mild gen tenderness in the lower quadrant.  Central nervous system: Alert and oriented. No focal neurological deficits. Extremities: Symmetric 5 x 5 power. Skin: No rashes,  Psychiatry:Mood & affect appropriate.    Data Reviewed:  I have personally reviewed following labs and imaging studies   CBC Lab Results  Component Value Date   WBC 6.0 04/30/2024   RBC 3.38 (L) 04/30/2024   HGB 10.1 (L) 04/30/2024   HCT 32.0 (L) 04/30/2024   MCV 94.7 04/30/2024   MCH 29.9 04/30/2024   PLT 245 04/30/2024   MCHC 31.6 04/30/2024   RDW 12.6 04/30/2024   LYMPHSABS 2.2 12/02/2023   MONOABS 0.5 12/02/2023   EOSABS 0.0 12/02/2023   BASOSABS 0.0 12/02/2023     Last metabolic panel Lab Results  Component Value Date   NA 138 04/30/2024   K 3.6 04/30/2024   CL 104 04/30/2024   CO2 24 04/30/2024    BUN 12 04/30/2024   CREATININE 1.14 (H) 04/30/2024   GLUCOSE 93 04/30/2024   GFRNONAA 51 (L) 04/30/2024   GFRAA 43 (L) 12/09/2020   CALCIUM  9.5 04/30/2024   PHOS 3.9 10/18/2016   PROT 7.1 04/26/2024   ALBUMIN 3.3 (L) 04/26/2024   LABGLOB 3.4 11/25/2017   AGRATIO 1.2  11/25/2017   BILITOT 0.7 04/26/2024   ALKPHOS 78 04/26/2024   AST 13 (L) 04/26/2024   ALT 8 04/26/2024   ANIONGAP 10 04/30/2024    CBG (last 3)  Recent Labs    04/30/24 0736 04/30/24 1119 04/30/24 1609  GLUCAP 97 93 162*      Coagulation Profile: Recent Labs  Lab 04/26/24 1655  INR 1.1     Radiology Studies: No results found.      Elgie Butter M.D. [facility] 04/30/2024, 5:56 PM  Available via Epic secure chat 7am-7pm After 7 pm, please refer to night coverage provider listed on amion.

## 2024-04-30 NOTE — Progress Notes (Signed)
 PHARMACY - ANTICOAGULATION CONSULT NOTE  Pharmacy Consult for Heparin  Indication: atrial fibrillation  Allergies  Allergen Reactions   Amiodarone  Other (See Comments)    Tremor/gait disurbance   Aspirin  Other (See Comments)    Burns my stomach   Methimazole  Nausea And Vomiting   Propoxyphene Nausea And Vomiting   Trintellix  [Vortioxetine ] Nausea And Vomiting   Fentanyl  Nausea And Vomiting   Albuterol  Palpitations   Lipitor [Atorvastatin] Other (See Comments)    Body aches and pains--stiffness.    Livalo  Ignatia.Heinrich ] Other (See Comments)    Body aches    Patient Measurements: Height: 5' 3 (160 cm) Weight: 114.8 kg (253 lb) IBW/kg (Calculated) : 52.4 HEPARIN  DW (KG): 80.3  Vital Signs: Temp: 97.6 F (36.4 C) (07/13 0426) Temp Source: Oral (07/13 0928) BP: 164/67 (07/13 0928) Pulse Rate: 60 (07/13 0928)  Labs: Recent Labs    04/27/24 1100 04/27/24 1509 04/28/24 0522 04/29/24 0705 04/29/24 2058 04/30/24 0807  HGB  --    < > 9.3* 10.2*  --  10.1*  HCT  --   --  29.2* 32.4*  --  32.0*  PLT  --   --  204 209  --  245  HEPARINUNFRC  --    < > 0.54  --  0.31 0.45  CREATININE 1.07*  --  1.18* 1.25*  --   --    < > = values in this interval not displayed.    Estimated Creatinine Clearance: 50.4 mL/min (A) (by C-G formula based on SCr of 1.25 mg/dL (H)).   Medical History: Past Medical History:  Diagnosis Date   Anemia    Ankle fracture, right    x 2   Antineoplastic and immunosuppressive drugs causing adverse effect in therapeutic use    Asthma    Chronic venous hypertension without complications    Colon polyps 2009   Coronary artery disease    no CAD by cath 11.2010   Depressive disorder, not elsewhere classified    Diabetes mellitus type 2 in obese    hgb AIC 6.1 09/02/2009, insulin  dependent    Diastolic CHF, chronic (HCC)    reports stable   Diverticulosis of colon 2009   colonoscopy   Gout    cortisone  shots helped   Hepatitis, unspecified     hx of/per pt, never was told of having hepatitis   Hx of hysterectomy 2002   Hyperlipidemia    Hypertension    controlled on medication    Hypothyroidism    reports her thryoid levels are normal now    Irritable bowel syndrome    Kidney disease    Lung nodule    12mm RLL nodule on CT Chest  07/2009, resolved by 2011 CT   Noncompliance    Obesity    Persistent atrial fibrillation (HCC)    s/p DCCV 07/2009; Pradaxa  Rx, states she is in sinus rhythm now    PONV (postoperative nausea and vomiting)    after receiving too much anesthesia after a hernia surgery    Rapid atrial fibrillation (HCC) 10/06/2023   Sarcoidosis    dx by eye exam; seen during eye exam , report asymtptomatic i dont have it now'    Sleep disturbance 06/2013   sleep study, mild abnormality, no criteria for CPAP    Assessment: AC/Heme: hx afib on pradaxa  PTA --> changed to heparin  drip in case I&D is needed for abscess or colostomy. IR does not feel there is enough for drainage and surgery trying  to advance diet.   Today, 04/30/24 Hgb 10.1, stable Plt 245  No line or bleeding issues with the heparin  drip   Goal of Therapy:  Heparin  level 0.3-0.7 units/ml Monitor platelets by anticoagulation protocol: Yes   Plan:  Continue   IV heparin  at 1150 units/hr Monitor daily heparin  level, CBC, s/s of bleed Monitor for signs and symptoms of bleeding F/u when pt is able to resume Pradaxa         Dolphus Roller, PharmD, BCPS 04/30/2024 9:50 AM

## 2024-04-30 NOTE — Progress Notes (Signed)
 Subjective/Chief Complaint: Still with lower ab pain, some emesis she thinks related to a med, tol diet, having flatus   Objective: Vital signs in last 24 hours: Temp:  [97.6 F (36.4 C)-98.1 F (36.7 C)] 97.6 F (36.4 C) (07/13 0426) Pulse Rate:  [55-65] 63 (07/13 0426) Resp:  [18] 18 (07/13 0426) BP: (143-178)/(60-65) 147/65 (07/13 0426) SpO2:  [95 %-98 %] 97 % (07/13 0426) Last BM Date : 04/29/24  Intake/Output from previous day: 07/12 0701 - 07/13 0700 In: 327.9 [P.O.:240; I.V.:37.9; IV Piggyback:50] Out: -  Intake/Output this shift: No intake/output data recorded.  Ab soft tender llq>rlq, nondistended  Lab Results:  Recent Labs    04/29/24 0705 04/30/24 0807  WBC 5.6 6.0  HGB 10.2* 10.1*  HCT 32.4* 32.0*  PLT 209 245   BMET Recent Labs    04/28/24 0522 04/29/24 0705  NA 138 138  K 3.8 3.5  CL 106 106  CO2 22 23  GLUCOSE 103* 122*  BUN 13 13  CREATININE 1.18* 1.25*  CALCIUM  9.1 9.0   PT/INR No results for input(s): LABPROT, INR in the last 72 hours. ABG No results for input(s): PHART, HCO3 in the last 72 hours.  Invalid input(s): PCO2, PO2  Studies/Results: CT ABDOMEN PELVIS W CONTRAST Result Date: 04/28/2024 CLINICAL DATA:  Left lower quadrant pain EXAM: CT ABDOMEN AND PELVIS WITH CONTRAST TECHNIQUE: Multidetector CT imaging of the abdomen and pelvis was performed using the standard protocol following bolus administration of intravenous contrast. RADIATION DOSE REDUCTION: This exam was performed according to the departmental dose-optimization program which includes automated exposure control, adjustment of the mA and/or kV according to patient size and/or use of iterative reconstruction technique. CONTRAST:  OMNIPAQUE  IOHEXOL  300 MG/ML  SOLN COMPARISON:  None Available. FINDINGS: Lower chest: No acute abnormality. Hepatobiliary: No focal liver abnormality is seen. Status post cholecystectomy. No biliary dilatation. Pancreas:  Unremarkable. No pancreatic ductal dilatation or surrounding inflammatory changes. Spleen: Normal in size without focal abnormality. Adrenals/Urinary Tract: The the the there is scarring in the superior pole the left kidney. There is no hydronephrosis or perinephric fluid. The adrenal glands are within normal limits. There is wall thickening and inflammatory stranding of the bladder, particularly the dome of the bladder. Stomach/Bowel: Stomach is within normal limits. Appendix appears normal. Wall thickening of the sigmoid colon is unchanged. Scattered colonic diverticula in the sigmoid colon again noted. Questionable early abscess identified in the wall of the sigmoid colon image 2/64 measuring 2.2 x 1.1 by 1.6 cm. There is scattered sigmoid colon diverticulosis. Vascular/Lymphatic: Aorta and IVC are normal in size. No enlarged lymph nodes are seen. Reproductive: Uterus is surgically absent. The right ovary is prominent in size. Left ovary normal in size. There is stranding and fluid surrounding both ovaries. Other: There is a small amount of free fluid in the pelvis which appears with some peritoneal enhancement similar to the prior study. Pre rectal fluid collection measures 5.1 x 6.7 by 2.9 cm similar to the prior study. There is some stranding and fluid along the inferior retroperitoneum similar to the prior study. Musculoskeletal: Degenerative changes affect the spine. IMPRESSION: 1. Stable wall thickening of the sigmoid colon with diverticulosis. Findings may represent chronic diverticulitis. There is a questionable early abscess in the wall of the sigmoid colon measuring up to 2.2 cm. 2. Stable free fluid in the pelvis with peritoneal enhancement. 3. Stable pre rectal fluid collection measuring up to 6.7 cm concerning for abscess. 4. Stable inflammatory stranding and  fluid along the inferior retroperitoneum. 5. Stable wall thickening and inflammatory stranding of the bladder. Findings may be reactive or  related to cystitis. 6. Stable prominent right ovary. Electronically Signed   By: Greig Pique M.D.   On: 04/28/2024 16:36    Anti-infectives: Anti-infectives (From admission, onward)    Start     Dose/Rate Route Frequency Ordered Stop   04/26/24 1000  piperacillin -tazobactam (ZOSYN ) IVPB 3.375 g        3.375 g 12.5 mL/hr over 240 Minutes Intravenous Every 8 hours 04/26/24 0323     04/26/24 0230  piperacillin -tazobactam (ZOSYN ) IVPB 3.375 g        3.375 g 100 mL/hr over 30 Minutes Intravenous  Once 04/26/24 0226 04/26/24 0309       Assessment/Plan: Diverticulitis with 5.7 cm abscess, history of severe sigmoid stricture   - she is afebrile, nl wbc -still tender -I discussed with IR yesterday and they do not think anything drainable -I told her today I think she likely just needs surgery and discussed colectomy, possible colostomy. She is not ready to make this decision -would continue abx, we can discuss more with her tomorrow   FEN - soft VTE - heparin  gtt, Pradaxa  on hold ID - Zosyn    Diastolic HF - EF 59-53% DM HTN CKD HLD Gout GAD PAF - Pradaxa  on hold Chronic anemia Asthma  I reviewed hospitalist notes, last 24 h vitals and pain scores, last 48 h intake and output, last 24 h labs and trends, and last 24 h imaging results.  Stephanie [name] 04/30/2024

## 2024-04-30 NOTE — Plan of Care (Signed)
   Problem: Nutrition: Goal: Adequate nutrition will be maintained Outcome: Progressing   Problem: Pain Managment: Goal: General experience of comfort will improve and/or be controlled Outcome: Progressing   Problem: Safety: Goal: Ability to remain free from injury will improve Outcome: Progressing

## 2024-05-01 DIAGNOSIS — N1831 Chronic kidney disease, stage 3a: Secondary | ICD-10-CM | POA: Diagnosis not present

## 2024-05-01 DIAGNOSIS — Z8679 Personal history of other diseases of the circulatory system: Secondary | ICD-10-CM | POA: Diagnosis not present

## 2024-05-01 DIAGNOSIS — K611 Rectal abscess: Secondary | ICD-10-CM | POA: Diagnosis not present

## 2024-05-01 DIAGNOSIS — K5792 Diverticulitis of intestine, part unspecified, without perforation or abscess without bleeding: Secondary | ICD-10-CM | POA: Diagnosis not present

## 2024-05-01 LAB — CULTURE, BLOOD (ROUTINE X 2)
Culture: NO GROWTH
Culture: NO GROWTH
Special Requests: ADEQUATE

## 2024-05-01 LAB — GLUCOSE, CAPILLARY: Glucose-Capillary: 116 mg/dL — ABNORMAL HIGH (ref 70–99)

## 2024-05-01 LAB — HEPARIN LEVEL (UNFRACTIONATED): Heparin Unfractionated: 0.44 [IU]/mL (ref 0.30–0.70)

## 2024-05-01 MED ORDER — AMOXICILLIN-POT CLAVULANATE 875-125 MG PO TABS: 1.0000 | ORAL_TABLET | Freq: Two times a day (BID) | ORAL | 0 refills | Status: AC

## 2024-05-01 NOTE — Progress Notes (Signed)
   Subjective/Chief Complaint: Feeling much better today.  No nausea/vomiting.  Tolerating diet.  Having bowel movements.   Objective: Vital signs in last 24 hours: Temp:  [97.5 F (36.4 C)-98.7 F (37.1 C)] 98.7 F (37.1 C) (07/14 0415) Pulse Rate:  [58-67] 67 (07/14 0415) Resp:  [16-18] 18 (07/14 0415) BP: (164-188)/(67-82) 167/78 (07/14 0415) SpO2:  [94 %-100 %] 94 % (07/14 0415) Last BM Date : 04/29/24  Intake/Output from previous day: 07/13 0701 - 07/14 0700 In: 2238 [P.O.:240; I.V.:1870.7; IV Piggyback:127.3] Out: -  Intake/Output this shift: No intake/output data recorded.  Ab soft tender llq>rlq, nondistended  Lab Results:  Recent Labs    04/29/24 0705 04/30/24 0807  WBC 5.6 6.0  HGB 10.2* 10.1*  HCT 32.4* 32.0*  PLT 209 245   BMET Recent Labs    04/29/24 0705 04/30/24 0805  NA 138 138  K 3.5 3.6  CL 106 104  CO2 23 24  GLUCOSE 122* 93  BUN 13 12  CREATININE 1.25* 1.14*  CALCIUM  9.0 9.5   PT/INR No results for input(s): LABPROT, INR in the last 72 hours. ABG No results for input(s): PHART, HCO3 in the last 72 hours.  Invalid input(s): PCO2, PO2  Studies/Results: No results found.   Anti-infectives: Anti-infectives (From admission, onward)    Start     Dose/Rate Route Frequency Ordered Stop   04/26/24 1000  piperacillin -tazobactam (ZOSYN ) IVPB 3.375 g        3.375 g 12.5 mL/hr over 240 Minutes Intravenous Every 8 hours 04/26/24 0323     04/26/24 0230  piperacillin -tazobactam (ZOSYN ) IVPB 3.375 g        3.375 g 100 mL/hr over 30 Minutes Intravenous  Once 04/26/24 0226 04/26/24 0309       Assessment/Plan: CT with concern for diverticulitis with 5.7 cm abscess, history of severe sigmoid stricture   On my review of CT, I wonder if this is actually some spontaneous hemorrhage around the sigmoid colon instead of diverticulitis causing her issues.  The wall of the colon is not particularly inflamed.  She had no white count  since admission and she is slightly anemic.  The fluid collection, possible hematoma, possible abscess was stable on 48 hour repeat CT and her HGB has been stable so I think it is okay to continue home blood  thinner at this point.  I would discharge her from the hospital with outpatient follow up in regards to the sigmoid stricture.  She likely needs a sigmoid colectomy, but hopefully this could be done electively with anastomosis, after this fluid collection resolves.  Recommend discharge home today or ASAP per medicine team.  She can complete a course of outpatient antibiotics in case this is an abscess.  Follow up with me in office.     FEN - soft VTE - heparin  gtt, Restart Pradaxa  ID - Zosyn    Diastolic HF - EF 59-53% DM HTN CKD HLD Gout GAD PAF - Pradaxa  on hold Chronic anemia Asthma  I reviewed hospitalist notes, last 24 h vitals and pain scores, last 48 h intake and output, last 24 h labs and trends, and last 24 h imaging results.  Deward PARAS Shabre Kreher 05/01/2024

## 2024-05-01 NOTE — Plan of Care (Signed)
  Problem: Education: Goal: Ability to describe self-care measures that may prevent or decrease complications (Diabetes Survival Skills Education) will improve Outcome: Progressing   Problem: Skin Integrity: Goal: Risk for impaired skin integrity will decrease Outcome: Progressing   Problem: Education: Goal: Knowledge of General Education information will improve Description: Including pain rating scale, medication(s)/side effects and non-pharmacologic comfort measures Outcome: Progressing   Problem: Clinical Measurements: Goal: Will remain free from infection Outcome: Progressing   Problem: Activity: Goal: Risk for activity intolerance will decrease Outcome: Progressing

## 2024-05-01 NOTE — Progress Notes (Signed)
 PHARMACY - ANTICOAGULATION CONSULT NOTE  Pharmacy Consult for Heparin  Indication: atrial fibrillation  Allergies  Allergen Reactions   Amiodarone  Other (See Comments)    Tremor/gait disurbance   Aspirin  Other (See Comments)    Burns my stomach   Methimazole  Nausea And Vomiting   Propoxyphene Nausea And Vomiting   Trintellix  [Vortioxetine ] Nausea And Vomiting   Fentanyl  Nausea And Vomiting   Albuterol  Palpitations   Lipitor [Atorvastatin] Other (See Comments)    Body aches and pains--stiffness.    Livalo  [Pitavastatin ] Other (See Comments)    Body aches    Patient Measurements: Height: 5' 3 (160 cm) Weight: 114.8 kg (253 lb) IBW/kg (Calculated) : 52.4 HEPARIN  DW (KG): 80.3  Vital Signs: Temp: 98.7 F (37.1 C) (07/14 0415) Temp Source: Oral (07/14 0415) BP: 167/78 (07/14 0415) Pulse Rate: 67 (07/14 0415)  Labs: Recent Labs    04/29/24 0705 04/29/24 2058 04/30/24 0805 04/30/24 0807 05/01/24 0505  HGB 10.2*  --   --  10.1*  --   HCT 32.4*  --   --  32.0*  --   PLT 209  --   --  245  --   HEPARINUNFRC  --  0.31  --  0.45 0.44  CREATININE 1.25*  --  1.14*  --   --     Estimated Creatinine Clearance: 55.3 mL/min (A) (by C-G formula based on SCr of 1.14 mg/dL (H)).   Medical History: Past Medical History:  Diagnosis Date   Anemia    Ankle fracture, right    x 2   Antineoplastic and immunosuppressive drugs causing adverse effect in therapeutic use    Asthma    Chronic venous hypertension without complications    Colon polyps 2009   Coronary artery disease    no CAD by cath 11.2010   Depressive disorder, not elsewhere classified    Diabetes mellitus type 2 in obese    hgb AIC 6.1 09/02/2009, insulin  dependent    Diastolic CHF, chronic (HCC)    reports stable   Diverticulosis of colon 2009   colonoscopy   Gout    cortisone  shots helped   Hepatitis, unspecified    hx of/per pt, never was told of having hepatitis   Hx of hysterectomy 2002    Hyperlipidemia    Hypertension    controlled on medication    Hypothyroidism    reports her thryoid levels are normal now    Irritable bowel syndrome    Kidney disease    Lung nodule    12mm RLL nodule on CT Chest  07/2009, resolved by 2011 CT   Noncompliance    Obesity    Persistent atrial fibrillation (HCC)    s/p DCCV 07/2009; Pradaxa  Rx, states she is in sinus rhythm now    PONV (postoperative nausea and vomiting)    after receiving too much anesthesia after a hernia surgery    Rapid atrial fibrillation (HCC) 10/06/2023   Sarcoidosis    dx by eye exam; seen during eye exam , report asymtptomatic i dont have it now'    Sleep disturbance 06/2013   sleep study, mild abnormality, no criteria for CPAP   Assessment:  AC/Heme: Afib on pradaxa  PTA --> heparin  drip in case I&D is needed for abscess or colostomy. IR does not feel there is enough for drainage and surgery trying to advance diet.  - Hep level 0.44, Hgb low but stable. Plts 245 up.   Goal of Therapy:  Heparin  level 0.3-0.7 units/ml  Monitor platelets by anticoagulation protocol: Yes   Plan:  Holding Pradaxa  IV heparin  1250 units/hr Daily HL and CBC   Loie Jahr Karoline Marina, PharmD, BCPS Clinical Staff Pharmacist  Marina Salines Stillinger 05/01/2024,7:28 AM

## 2024-05-01 NOTE — Discharge Instructions (Signed)
 Low fiber diet for 4 weeks then transition to a high fiber diet

## 2024-05-02 NOTE — Discharge Summary (Signed)
 Physician Discharge Summary   Patient: Stephanie [name] MRN: [id number] DOB: 1950-12-01  Admit date:     04/25/2024  Discharge date: 05/02/24  Discharge Physician: Elgie Butter   PCP: Joshua Debby CROME, MD   Recommendations at discharge:  Please follow up with PCP in one week.  Please follow up with general surgery as recommended.   Discharge Diagnoses: Principal Problem:   Rectal abscess Active Problems:   Acute diverticulitis   History of atrial fibrillation   Essential hypertension   Gout flare   Chronic systolic CHF (congestive heart failure) (HCC)   CKD stage 3a, GFR 45-59 ml/min (HCC)   Non-insulin  dependent type 2 diabetes mellitus (HCC)   QT prolongation  Resolved Problems:   * No resolved hospital problems. *  Hospital Course: [name] is a 73 y.o. female with medical history significant of paroxysmal atrial fibrillation, gout, generalized anxiety disorder, essential hypertension, hyperlipidemia, HFrEF 40 to 45%, CKD 3A, non-insulin -dependent DM type II, anemia of chronic disease, asthma, chronic polyp, diverticulosis, presented to emergency department complaining of worsening abdominal pain for last 2 days and right-sided index finger pain with swelling and was diagnosed with acute diverticulitis with perirectal abscess.  General surgery consulted.  Hemodynamically stable upon arrival.     Assessment and Plan:  Prerectal abscess and acute diverticulitis present on admission  CT abdomen pelvis showed evidence fo acute diverticulitis and prerectal abscess measuring 5.7 cm.  General Surgery following.   No plans for surgical intervention today or tomorrow.plan  with conservative management with IV antibiotics and slowly advancing diet with tolerance. She was able to tolerate diet without any pain or nausea or vomiting.  Gen surgery cleared her for discharge and recommended outpatient follow up with for the sigmoid stricture. She will ultimately need sigmoid colectomy,  to be scheduled electively with anastomosis.  She was discharged on augmentin  to complete the course.         PAF Rate controlled with amiodarone  and toprol  XL.  Resume pradaxa .        Chronic systolic CHF She appears Euvolemic.  Continue with Entreso and Toprol  XL     Hypertension:  Suboptimally controlled. D/c IV fluids and recheck BP later this evening.  Patient currently on Entresto , metoprolol  and hydralazine  prn.      Hypokalemia Replaced. Repeat levels wnl     Normocytic anemia Hemoglobin between 9 to 10. Continue to monitor.      Type 2 DM Resume home meds.       GAD Resume xanax .      Gout flare of the right second metacarpophalangeal joint.  On colchicine .    GERD Stable on protonix .       Acute on Stage 3 a CKD Slight worsening of creatinine from 1 to 1.1 to 1.2. Start the patient on IV fluids 75 ml/hr. Repeat BMP in am.  Continue to monitor.      Body mass index is 44.82 kg/m. Weight loss recommended.     Consultants: gen surgery.  Procedures performed: none.   Disposition: Home Diet recommendation:  Discharge Diet Orders (From admission, onward)     Start     Ordered   05/01/24 0000  Diet - low sodium heart healthy        05/01/24 1101           Carb modified diet DISCHARGE MEDICATION: Allergies as of 05/01/2024       Reactions   Amiodarone  Other (See Comments)   Tremor/gait disurbance   Aspirin   Other (See Comments)   Burns my stomach   Methimazole  Nausea And Vomiting   Propoxyphene Nausea And Vomiting   Trintellix  [vortioxetine ] Nausea And Vomiting   Fentanyl  Nausea And Vomiting   Albuterol  Palpitations   Lipitor [atorvastatin] Other (See Comments)   Body aches and pains--stiffness.    Livalo  [pitavastatin ] Other (See Comments)   Body aches        Medication List     STOP taking these medications    Advil  200 MG tablet Generic drug: ibuprofen    Cortisporin -TC 3.12-19-08-0.5 MG/ML OTIC  suspension Generic drug: neomycin-colistin-hydrocortisone -thonzonium       TAKE these medications    Accu-Chek FastClix Lancets Misc   allopurinol  100 MG tablet Commonly known as: ZYLOPRIM  TAKE 1/2 TABLET(50 MG) BY MOUTH TWICE DAILY What changed: See the new instructions.   ALPRAZolam  0.5 MG tablet Commonly known as: XANAX  Take 1 tablet (0.5 mg total) by mouth 3 (three) times daily as needed for anxiety.   amiodarone  200 MG tablet Commonly known as: PACERONE  Take 1 tablet (200 mg total) by mouth 2 (two) times daily for 30 days, THEN 1 tablet (200 mg total) daily. Start taking on: August 23, 2023 What changed: See the new instructions.   amoxicillin -clavulanate 875-125 MG tablet Commonly known as: AUGMENTIN  Take 1 tablet by mouth 2 (two) times daily for 7 days.   Blood Pressure Kit 1 kit by Does not apply route daily. Check blood pressure once a day   colchicine  0.6 MG tablet Take 1 tablet (0.6 mg total) by mouth daily. What changed:  when to take this reasons to take this   dabigatran  150 MG Caps capsule Commonly known as: Pradaxa  Take 1 capsule (150 mg total) by mouth 2 (two) times daily.   Entresto  24-26 MG Generic drug: sacubitril -valsartan  TAKE 1 TABLET BY MOUTH TWICE DAILY   furosemide  40 MG tablet Commonly known as: LASIX  Take 1 tablet (40 mg total) by mouth daily. What changed:  when to take this reasons to take this   Jardiance  25 MG Tabs tablet Generic drug: empagliflozin  TAKE 1 TABLET(25 MG) BY MOUTH DAILY   Kerendia  20 MG Tabs Generic drug: Finerenone  TAKE 1 TABLET(20 MG) BY MOUTH DAILY   metoprolol  succinate 50 MG 24 hr tablet Commonly known as: TOPROL -XL Take 1 tablet (50 mg total) by mouth daily.   polyethylene glycol 17 g packet Commonly known as: MIRALAX  / GLYCOLAX  Take 17 g by mouth 2 (two) times daily. Titrate as needed   simvastatin  10 MG tablet Commonly known as: ZOCOR  TAKE 1 TABLET(10 MG) BY MOUTH AT BEDTIME   terbinafine  1 % cream Commonly known as: LAMISIL Apply 1 Application topically 2 (two) times daily as needed (skin irritation/itching.).   tirzepatide  5 MG/0.5ML Pen Commonly known as: MOUNJARO  Inject 5 mg into the skin once a week.        Follow-up Information     Stechschulte, Deward PARAS, MD Follow up on 05/25/2024.   Specialty: Surgery Why: 9:10am, Arrive 30 minutes prior to your appointment time, Please bring your insurance card and photo ID Contact information: [address] [place] [address] [phone]                Discharge Exam: Fredricka Weights   04/25/24 2204  Weight: 114.8 kg   General exam: Appears calm and comfortable  Respiratory system: Clear to auscultation. Respiratory effort normal. Cardiovascular system: S1 & S2 heard, RRR. No JVD, Gastrointestinal system: Abdomen is nondistended, soft and  nontender. Central nervous system: Alert and oriented. No focal neurological deficits. Extremities: Symmetric 5 x 5 power. Skin: No rashes,  Psychiatry: Mood & affect appropriate.    Condition at discharge: fair  The results of significant diagnostics from this hospitalization (including imaging, microbiology, ancillary and laboratory) are listed below for reference.   Imaging Studies: CT ABDOMEN PELVIS W CONTRAST Result Date: 04/28/2024 CLINICAL DATA:  Left lower quadrant pain EXAM: CT ABDOMEN AND PELVIS WITH CONTRAST TECHNIQUE: Multidetector CT imaging of the abdomen and pelvis was performed using the standard protocol following bolus administration of intravenous contrast. RADIATION DOSE REDUCTION: This exam was performed according to the departmental dose-optimization program which includes automated exposure control, adjustment of the mA and/or kV according to patient size and/or use of iterative reconstruction technique. CONTRAST:  OMNIPAQUE  IOHEXOL  300 MG/ML  SOLN COMPARISON:  None Available. FINDINGS: Lower chest: No acute abnormality.  Hepatobiliary: No focal liver abnormality is seen. Status post cholecystectomy. No biliary dilatation. Pancreas: Unremarkable. No pancreatic ductal dilatation or surrounding inflammatory changes. Spleen: Normal in size without focal abnormality. Adrenals/Urinary Tract: The the the there is scarring in the superior pole the left kidney. There is no hydronephrosis or perinephric fluid. The adrenal glands are within normal limits. There is wall thickening and inflammatory stranding of the bladder, particularly the dome of the bladder. Stomach/Bowel: Stomach is within normal limits. Appendix appears normal. Wall thickening of the sigmoid colon is unchanged. Scattered colonic diverticula in the sigmoid colon again noted. Questionable early abscess identified in the wall of the sigmoid colon image 2/64 measuring 2.2 x 1.1 by 1.6 cm. There is scattered sigmoid colon diverticulosis. Vascular/Lymphatic: Aorta and IVC are normal in size. No enlarged lymph nodes are seen. Reproductive: Uterus is surgically absent. The right ovary is prominent in size. Left ovary normal in size. There is stranding and fluid surrounding both ovaries. Other: There is a small amount of free fluid in the pelvis which appears with some peritoneal enhancement similar to the prior study. Pre rectal fluid collection measures 5.1 x 6.7 by 2.9 cm similar to the prior study. There is some stranding and fluid along the inferior retroperitoneum similar to the prior study. Musculoskeletal: Degenerative changes affect the spine. IMPRESSION: 1. Stable wall thickening of the sigmoid colon with diverticulosis. Findings may represent chronic diverticulitis. There is a questionable early abscess in the wall of the sigmoid colon measuring up to 2.2 cm. 2. Stable free fluid in the pelvis with peritoneal enhancement. 3. Stable pre rectal fluid collection measuring up to 6.7 cm concerning for abscess. 4. Stable inflammatory stranding and fluid along the inferior  retroperitoneum. 5. Stable wall thickening and inflammatory stranding of the bladder. Findings may be reactive or related to cystitis. 6. Stable prominent right ovary. Electronically Signed   By: Greig Pique M.D.   On: 04/28/2024 16:36   DG Hand 2 View Right Result Date: 04/26/2024 CLINICAL DATA:  Swelling in the second through fifth metacarpals. EXAM: RIGHT HAND - 2 VIEW COMPARISON:  None Available. FINDINGS: Two-view exam limited by superimposition of bony anatomy on the lateral film. Within this limitation, no acute fracture or dislocation is evident. No worrisome lytic or sclerotic osseous abnormality. IMPRESSION: Negative. Electronically Signed   By: Camellia Candle M.D.   On: 04/26/2024 04:59   CT ABDOMEN PELVIS W CONTRAST Result Date: 04/26/2024 CLINICAL DATA:  Left lower quadrant abdominal pain starting 2 days ago. History of diverticulitis. EXAM: CT ABDOMEN AND PELVIS WITH CONTRAST TECHNIQUE: Multidetector CT imaging of the  abdomen and pelvis was performed using the standard protocol following bolus administration of intravenous contrast. RADIATION DOSE REDUCTION: This exam was performed according to the departmental dose-optimization program which includes automated exposure control, adjustment of the mA and/or kV according to patient size and/or use of iterative reconstruction technique. CONTRAST:  OMNIPAQUE  IOHEXOL  300 MG/ML  SOLN COMPARISON:  10/14/2023 FINDINGS: Lower chest: Lung bases are clear. Hepatobiliary: No focal liver abnormality is seen. Status post cholecystectomy. No biliary dilatation. Pancreas: Unremarkable. No pancreatic ductal dilatation or surrounding inflammatory changes. Spleen: Normal in size without focal abnormality. Adrenals/Urinary Tract: Adrenal glands are unremarkable. Kidneys are normal, without renal calculi, focal lesion, or hydronephrosis. Bladder is unremarkable. Stomach/Bowel: Stomach, small bowel, and colon are not abnormally distended. Diverticulosis of the  sigmoid colon. Pericolonic stranding with pericolonic fluid and edema consistent with acute diverticulitis. Loculated collection in the pre rectal space measuring 5.7 cm diameter consistent with abscess. Small amount of free fluid in the pelvis is likely reactive. Vascular/Lymphatic: No significant vascular findings are present. No enlarged abdominal or pelvic lymph nodes. Reproductive: Uterus is surgically absent. Ovaries are not enlarged. Other: No free air in the abdomen. Scarring along the midline anterior abdominal wall consistent with postoperative change. Omental focus of fat necrosis is also likely postoperative. Musculoskeletal: Degenerative changes in the spine. No acute bony abnormalities. IMPRESSION: 1. Diverticulosis of the sigmoid colon with inflammatory changes consistent with acute diverticulitis. Pre rectal abscess measuring 5.7 cm. Free fluid in the pelvis is likely reactive. 2. Anterior abdominal wall scarring and omental fat necrosis likely postoperative. Electronically Signed   By: Elsie Gravely M.D.   On: 04/26/2024 02:22    Microbiology: Results for orders placed or performed during the hospital encounter of 04/25/24  Culture, blood (Routine X 2) w Reflex to ID Panel     Status: None   Collection Time: 04/26/24  4:14 AM   Specimen: BLOOD  Result Value Ref Range Status   Specimen Description   Final    BLOOD RIGHT ANTECUBITAL Performed at [facility], [address], [place], [place] [address]    Special Requests   Final    BOTTLES DRAWN AEROBIC AND ANAEROBIC Blood Culture results may not be optimal due to an inadequate volume of blood received in culture bottles Performed at [facility], [address] [place], [place] [address]    Culture   Final    NO GROWTH 5 DAYS Performed at [facility], [address] [place], [place] [address]    Report Status 05/01/2024 FINAL  Final  Culture, blood (Routine X 2) w Reflex to ID  Panel     Status: None   Collection Time: 04/26/24  4:55 PM   Specimen: BLOOD  Result Value Ref Range Status   Specimen Description   Final    BLOOD BLOOD LEFT HAND Performed at [facility], [address] [place], [place] [address]    Special Requests   Final    BOTTLES DRAWN AEROBIC AND ANAEROBIC Blood Culture adequate volume Performed at [facility], [address] [place], [place] [address]    Culture   Final    NO GROWTH 5 DAYS Performed at [facility], [address], [place], [place] [address]    Report Status 05/01/2024 FINAL  Final    Labs: CBC: Recent Labs  Lab 04/26/24 0419 04/27/24 0537 04/28/24 0522 04/29/24 0705 04/30/24 0807  WBC 7.9 7.3 8.0 5.6 6.0  HGB 10.0* 9.9* 9.3* 10.2* 10.1*  HCT 31.6* 31.2* 29.2* 32.4* 32.0*  MCV 94.6 95.4 94.8 95.9 94.7  PLT 203 189 204 209 245   Basic Metabolic Panel: Recent Labs  Lab 04/26/24 0419 04/27/24 1100 04/28/24 0522 04/29/24 0705 04/30/24 0805  NA 138 139 138 138 138  K 3.2* 3.7 3.8 3.5 3.6  CL 105 104 106 106 104  CO2 23 24 22 23 24   GLUCOSE 129* 97 103* 122* 93  BUN 19 14 13 13 12   CREATININE 1.07* 1.07* 1.18* 1.25* 1.14*  CALCIUM  8.7* 9.1 9.1 9.0 9.5   Liver Function Tests: Recent Labs  Lab 04/25/24 2216 04/26/24 0419  AST 14* 13*  ALT 11 8  ALKPHOS 93 78  BILITOT 0.6 0.7  PROT 8.0 7.1  ALBUMIN 3.8 3.3*   CBG: Recent Labs  Lab 04/30/24 0736 04/30/24 1119 04/30/24 1609 04/30/24 2049 05/01/24 0738  GLUCAP 97 93 162* 106* 116*    Discharge time spent: 40 minutes.   Signed: Page Lancon, MD [facility]

## 2024-05-03 ENCOUNTER — Telehealth: Payer: Self-pay

## 2024-05-03 NOTE — Transitions of Care (Post Inpatient/ED Visit) (Signed)
   05/03/2024  Name: Stephanie [name] MRN: [id number] DOB: 02/24/1951  Today's TOC FU Call Status: Today's TOC FU Call Status:: Unsuccessful Call (1st Attempt) Unsuccessful Call (1st Attempt) Date: 05/03/24  Attempted to reach the patient regarding the most recent Inpatient/ED visit.  Follow Up Plan: Additional outreach attempts will be made to reach the patient to complete the Transitions of Care (Post Inpatient/ED visit) call.   Arvin Seip RN, BSN, CCM [employer], Population Health Case Manager Phone: [phone]

## 2024-05-04 ENCOUNTER — Telehealth: Payer: Self-pay

## 2024-05-04 NOTE — Transitions of Care (Post Inpatient/ED Visit) (Signed)
   05/04/2024  Name: Stephanie [name] MRN: [id number] DOB: 07/08/1951  Today's TOC FU Call Status: Today's TOC FU Call Status:: Unsuccessful Call (2nd Attempt) Unsuccessful Call (2nd Attempt) Date: 05/04/24  Attempted to reach the patient regarding the most recent Inpatient/ED visit.  Follow Up Plan: Additional outreach attempts will be made to reach the patient to complete the Transitions of Care (Post Inpatient/ED visit) call.   Shona Prow RN, CCM [place]  VBCI-Population Health RN Care Manager [phone]

## 2024-05-05 ENCOUNTER — Telehealth: Payer: Self-pay

## 2024-05-05 NOTE — Transitions of Care (Post Inpatient/ED Visit) (Signed)
   05/05/2024  Name: Stephanie [name] MRN: [id number] DOB: 1951/08/21  Today's TOC FU Call Status: Today's TOC FU Call Status:: Unsuccessful Call (3rd Attempt) Unsuccessful Call (3rd Attempt) Date: 05/05/24 (patient requested call back Monday morning)  Attempted to reach the patient regarding the most recent Inpatient/ED visit.  Follow Up Plan: Additional outreach attempts will be made to reach the patient to complete the Transitions of Care (Post Inpatient/ED visit) call.   Shona Prow RN, CCM [place]  VBCI-Population Health RN Care Manager [phone]

## 2024-05-08 ENCOUNTER — Telehealth: Payer: Self-pay

## 2024-05-08 NOTE — Transitions of Care (Post Inpatient/ED Visit) (Signed)
   05/08/2024  Name: Stephanie [name] MRN: [id number] DOB: 10/29/1950  Today's TOC FU Call Status:    Attempted to reach the patient regarding the most recent Inpatient/ED visit.  Follow Up Plan: No further outreach attempts will be made at this time. We have been unable to contact the patient.  Shona Prow RN, CCM [place]  VBCI-Population Health RN Care Manager [phone]

## 2024-05-10 ENCOUNTER — Other Ambulatory Visit: Payer: Self-pay | Admitting: Internal Medicine

## 2024-05-10 DIAGNOSIS — E1122 Type 2 diabetes mellitus with diabetic chronic kidney disease: Secondary | ICD-10-CM

## 2024-05-10 NOTE — Telephone Encounter (Signed)
 Pt last saw Dr Fernande 12/16/23, last labs 04/30/24 Creat 1.14, age 73, weight 114.8kg, CrCl 82.03, based on CrCl pt is on appropriate dosage of Pradaxa  150mg  BID for afib.  Will refill rx.

## 2024-05-25 ENCOUNTER — Other Ambulatory Visit: Payer: Self-pay | Admitting: Surgery

## 2024-05-25 DIAGNOSIS — K56699 Other intestinal obstruction unspecified as to partial versus complete obstruction: Secondary | ICD-10-CM

## 2024-06-03 ENCOUNTER — Other Ambulatory Visit: Payer: Self-pay | Admitting: Family

## 2024-06-03 DIAGNOSIS — E785 Hyperlipidemia, unspecified: Secondary | ICD-10-CM

## 2024-06-08 ENCOUNTER — Ambulatory Visit
Admission: RE | Admit: 2024-06-08 | Discharge: 2024-06-08 | Disposition: A | Discharge status: Home / Self Care | Source: Ambulatory Visit | Attending: Surgery | Admitting: Surgery

## 2024-06-08 DIAGNOSIS — K56699 Other intestinal obstruction unspecified as to partial versus complete obstruction: Secondary | ICD-10-CM

## 2024-06-08 DIAGNOSIS — K5732 Diverticulitis of large intestine without perforation or abscess without bleeding: Secondary | ICD-10-CM | POA: Diagnosis not present

## 2024-06-08 MED ORDER — IOPAMIDOL (ISOVUE-300) INJECTION 61%
100.0000 mL | Freq: Once | INTRAVENOUS | Status: AC | PRN
Start: 2024-06-08 — End: 2024-06-08
  Administered 2024-06-08: 100 mL via INTRAVENOUS

## 2024-06-14 ENCOUNTER — Telehealth: Payer: Self-pay | Admitting: Internal Medicine

## 2024-06-14 NOTE — Telephone Encounter (Signed)
 Patient stated she will be bringing in her disability paperwork and wants a call back for advice on next steps.  Patient stated she was in hospital and will need the paperwork completed by Friday, 8/29.

## 2024-06-14 NOTE — Telephone Encounter (Signed)
 Left a message for the patient to call back.

## 2024-06-15 NOTE — Telephone Encounter (Signed)
 Pt returning nurse's call. Paperwork can be completed by Tuesday, 9/3 if need be.

## 2024-06-26 ENCOUNTER — Ambulatory Visit: Payer: Self-pay

## 2024-06-26 NOTE — Telephone Encounter (Signed)
 FYI Only or Action Required?: Action required by provider: update on patient condition.  Patient was last seen in primary care on 12/02/2023 by Joshua Debby CROME, MD.  Called Nurse Triage reporting Night Sweats.  Symptoms began several weeks ago.  Interventions attempted: Nothing.  Symptoms are: unchanged.  Triage Disposition: See PCP Within 2 Weeks  Patient/caregiver understands and will follow disposition?:   Copied from CRM [id number]. Topic: Clinical - Red Word Triage >> Jun 26, 2024  5:00 PM [place] R wrote: Decision tree denial- New/ worst  symptoms of sweating  and clamminess when waking up, headaches here and there diarrhea that stopped yesterday. Reason for Disposition  Nursing judgment or information in reference  Answer Assessment - Initial Assessment Questions 1. REASON FOR CALL: What is your main concern right now?     sweating 2. ONSET: When did the sweating start?     Two weeks ago 3. SEVERITY: How bad is the sweating?     Mainly night sweats 4. FEVER: Do you have a fever?     denies 5. OTHER SYMPTOMS: Do you have any other new symptoms?     sweating 6. TREATMENTS AND RESPONSE: What have you done so far to try to make this better? What medicines have you used?     nothing   Patient with history or diabetes and recent diverticulitis flare reporting night sweats.  She does not check her blood sugars at home.   She denies any chest pain, SOB, dizziness, abdominal pain  Answer Assessment - Initial Assessment Questions 1. REASON FOR CALL: What is your main concern right now?     sweating 2. ONSET: When did the sweating start?     Two weeks ago 3. SEVERITY: How bad is the sweating?     Mainly night sweats 4. FUNCTIONAL IMPAIRMENT: How have things been going for you overall? Have you had more difficulty than usual doing your normal daily activities? (e.g., self-care, school, work, interactions)     N/a 5. RELIEVING AND AGGRAVATING FACTORS: What  makes it better or worse? (e.g., certain activities, rest)     N/a 6. FEVER: Do you have a fever?     denies 7. OTHER SYMPTOMS: Do you have any other new symptoms?     denies 8. TREATMENTS AND RESPONSE: What have you done so far to try to make this better? What medicines have you used?     nothing 9. PREGNANCY: Is there any chance you are pregnant? When was your last menstrual period?     N/A  Patient hospitalized for diverticulitis flare in 04/2024, history of diabetes, taking medication, but not monitoring blood sugar.  Protocols used: Hospice - No Guideline Available-[facility], No Guideline Available-[facility]

## 2024-07-04 ENCOUNTER — Encounter: Payer: Self-pay | Admitting: Internal Medicine

## 2024-07-04 ENCOUNTER — Ambulatory Visit: Admitting: Internal Medicine

## 2024-07-04 ENCOUNTER — Ambulatory Visit: Payer: Self-pay | Admitting: Internal Medicine

## 2024-07-04 VITALS — BP 136/82 | HR 58 | Temp 99.1°F | Ht 63.0 in | Wt 253.2 lb

## 2024-07-04 DIAGNOSIS — E119 Type 2 diabetes mellitus without complications: Secondary | ICD-10-CM

## 2024-07-04 DIAGNOSIS — U071 COVID-19: Secondary | ICD-10-CM | POA: Insufficient documentation

## 2024-07-04 DIAGNOSIS — I10 Essential (primary) hypertension: Secondary | ICD-10-CM | POA: Diagnosis not present

## 2024-07-04 DIAGNOSIS — K611 Rectal abscess: Secondary | ICD-10-CM

## 2024-07-04 DIAGNOSIS — Z7984 Long term (current) use of oral hypoglycemic drugs: Secondary | ICD-10-CM

## 2024-07-04 DIAGNOSIS — N1831 Chronic kidney disease, stage 3a: Secondary | ICD-10-CM

## 2024-07-04 DIAGNOSIS — I5032 Chronic diastolic (congestive) heart failure: Secondary | ICD-10-CM | POA: Diagnosis not present

## 2024-07-04 DIAGNOSIS — E538 Deficiency of other specified B group vitamins: Secondary | ICD-10-CM | POA: Diagnosis not present

## 2024-07-04 DIAGNOSIS — E785 Hyperlipidemia, unspecified: Secondary | ICD-10-CM

## 2024-07-04 DIAGNOSIS — F411 Generalized anxiety disorder: Secondary | ICD-10-CM

## 2024-07-04 LAB — LIPID PANEL
Cholesterol: 184 mg/dL (ref 0–200)
HDL: 60.1 mg/dL (ref >39.00)
LDL Cholesterol: 109 mg/dL — ABNORMAL HIGH (ref 0–99)
NonHDL: 123.4
Total CHOL/HDL Ratio: 3
Triglycerides: 71 mg/dL (ref 0.0–149.0)
VLDL: 14.2 mg/dL (ref 0.0–40.0)

## 2024-07-04 LAB — HEPATIC FUNCTION PANEL
ALT: 9 U/L (ref 0–35)
AST: 14 U/L (ref 0–37)
Albumin: 3.9 g/dL (ref 3.5–5.2)
Alkaline Phosphatase: 62 U/L (ref 39–117)
Bilirubin, Direct: 0 mg/dL (ref 0.0–0.3)
Total Bilirubin: 0.4 mg/dL (ref 0.2–1.2)
Total Protein: 7 g/dL (ref 6.0–8.3)

## 2024-07-04 LAB — URINALYSIS, ROUTINE W REFLEX MICROSCOPIC
Bilirubin Urine: NEGATIVE
Hgb urine dipstick: NEGATIVE
Ketones, ur: NEGATIVE
Leukocytes,Ua: NEGATIVE
Nitrite: NEGATIVE
RBC / HPF: NONE SEEN (ref 0–?)
Specific Gravity, Urine: 1.02 (ref 1.000–1.030)
Total Protein, Urine: NEGATIVE
Urine Glucose: >=1000 — AB
Urobilinogen, UA: 0.2 (ref 0.0–1.0)
pH: 5.5 (ref 5.0–8.0)

## 2024-07-04 LAB — CBC WITH DIFFERENTIAL/PLATELET
Basophils Absolute: 0 K/uL (ref 0.0–0.1)
Basophils Relative: 0.5 % (ref 0.0–3.0)
Eosinophils Absolute: 0.1 K/uL (ref 0.0–0.7)
Eosinophils Relative: 2 % (ref 0.0–5.0)
HCT: 32.3 % — ABNORMAL LOW (ref 36.0–46.0)
Hemoglobin: 10.5 g/dL — ABNORMAL LOW (ref 12.0–15.0)
Lymphocytes Relative: 45.5 % (ref 12.0–46.0)
Lymphs Abs: 2 K/uL (ref 0.7–4.0)
MCHC: 32.6 g/dL (ref 30.0–36.0)
MCV: 93.7 fl (ref 78.0–100.0)
Monocytes Absolute: 0.6 K/uL (ref 0.1–1.0)
Monocytes Relative: 13.6 % — ABNORMAL HIGH (ref 3.0–12.0)
Neutro Abs: 1.6 K/uL (ref 1.4–7.7)
Neutrophils Relative %: 38.4 % — ABNORMAL LOW (ref 43.0–77.0)
Platelets: 161 K/uL (ref 150.0–400.0)
RBC: 3.45 Mil/uL — ABNORMAL LOW (ref 3.87–5.11)
RDW: 15.4 % (ref 11.5–15.5)
WBC: 4.3 K/uL (ref 4.0–10.5)

## 2024-07-04 LAB — BASIC METABOLIC PANEL WITH GFR
BUN: 19 mg/dL (ref 6–23)
CO2: 26 meq/L (ref 19–32)
Calcium: 9.4 mg/dL (ref 8.4–10.5)
Chloride: 112 meq/L (ref 96–112)
Creatinine, Ser: 1.14 mg/dL (ref 0.40–1.20)
GFR: 48.25 mL/min — ABNORMAL LOW (ref >60.00)
Glucose, Bld: 175 mg/dL — ABNORMAL HIGH (ref 70–99)
Potassium: 3.9 meq/L (ref 3.5–5.1)
Sodium: 137 meq/L (ref 135–145)

## 2024-07-04 LAB — MICROALBUMIN / CREATININE URINE RATIO
Creatinine,U: 80.5 mg/dL
Microalb Creat Ratio: 15.4 mg/g (ref 0.0–30.0)
Microalb, Ur: 1.2 mg/dL (ref 0.0–1.9)

## 2024-07-04 LAB — POC COVID19 BINAXNOW: SARS Coronavirus 2 Ag: POSITIVE — AB

## 2024-07-04 LAB — POCT INFLUENZA A/B
Influenza A, POC: NEGATIVE
Influenza B, POC: NEGATIVE

## 2024-07-04 LAB — HEMOGLOBIN A1C: Hgb A1c MFr Bld: 6.6 % — ABNORMAL HIGH (ref 4.6–6.5)

## 2024-07-04 LAB — TSH: TSH: 2.32 u[IU]/mL (ref 0.35–5.50)

## 2024-07-04 MED ORDER — ALPRAZOLAM 0.5 MG PO TABS: 0.5000 mg | ORAL_TABLET | Freq: Three times a day (TID) | ORAL | 0 refills | Status: AC | PRN

## 2024-07-04 MED ORDER — NIRMATRELVIR/RITONAVIR (PAXLOVID) TABLET (RENAL DOSING)
2.0000 | ORAL_TABLET | Freq: Two times a day (BID) | ORAL | 0 refills | Status: AC
Start: 2024-07-04 — End: 2024-07-09

## 2024-07-04 MED ORDER — HYDROCODONE BIT-HOMATROP MBR 5-1.5 MG/5ML PO SOLN: 5.0000 mL | Freq: Four times a day (QID) | ORAL | 0 refills | Status: AC | PRN

## 2024-07-04 NOTE — Progress Notes (Signed)
 The test results show that your current treatment is OK, as the tests are stable.  Please continue the same plan.  There is no other need for change of treatment or further evaluation based on these results, at this time.  thanks

## 2024-07-04 NOTE — Assessment & Plan Note (Signed)
Mild to mod, for antibx course paxlovid renal dosed, cough med prn,  to f/u any worsening symptoms or concerns

## 2024-07-04 NOTE — Assessment & Plan Note (Signed)
 Lab Results  Component Value Date   LDLCALC 125 (H) 04/19/2023   Uncontrolled,, pt to continue current statin zocor  10 mg but check lipids today

## 2024-07-04 NOTE — Assessment & Plan Note (Signed)
 Lab Results  Component Value Date   CREATININE 1.14 (H) 04/30/2024   Stable overall, cont to avoid nephrotoxins, and f/u lab today

## 2024-07-04 NOTE — Assessment & Plan Note (Signed)
 Overall stable, for xanax  refill, f/u PCP

## 2024-07-04 NOTE — Patient Instructions (Signed)
 Your COVID testing was positive, and the Flu test was negative  Please take all new medication as prescribed - the paxlovid , and cough medicine as needed  Please continue all other medications as before, and refills have been done if requested.  Please have the pharmacy call with any other refills you may need.  Please continue your efforts at being more active, low cholesterol diet, and weight control.  Please keep your appointments with your specialists as you may have planned  Please go to the LAB at the blood drawing area for the tests to be done  You will be contacted by phone if any changes need to be made immediately.  Otherwise, you will receive a letter about your results with an explanation, but please check with MyChart first.  Please make an Appointment to return in 6 months, or sooner if needed, to Dr Joshua

## 2024-07-04 NOTE — Assessment & Plan Note (Signed)
 Clinically resolved, no further antibx needed

## 2024-07-04 NOTE — Assessment & Plan Note (Signed)
 Lab Results  Component Value Date   VITAMINB12 206 (L) 03/18/2022   Low, to start oral replacement - b12 1000 mcg qd

## 2024-07-04 NOTE — Assessment & Plan Note (Signed)
 BP Readings from Last 3 Encounters:  07/04/24 136/82  05/01/24 (!) 180/64  01/27/24 (!) 144/78   Stable, pt to continue medical treatment toprol  xl 50 qd

## 2024-07-04 NOTE — Progress Notes (Signed)
 Patient ID: Stephanie [name], female   DOB: [birth year], 73 y.o.   MRN: [id number]        Chief Complaint: follow up acute URI covid infection, recent rectal abscess, with hospn July 2025, DM, ckd3a, htn       HPI:  Stephanie [name] is a 73 y.o. female here with c/o acute congested cough, fatigue, HA, feels bad for 3 days after attending a tent revival with those around her coughing.  Pt denies chest pain, increased sob or doe, wheezing, orthopnea, PND, increased LE swelling, palpitations, dizziness or syncope.   Pt denies polydipsia, polyuria, or new focal neuro s/s.    Pt denies recent wt loss, night sweats, loss of appetite, or other constitutional symptoms  Denies worsening depressive symptoms, suicidal ideation, or panic; has ongoing anxiety, not increased recently.   Pt asking for DM labs while here today but plans to f/u with PCP as well  Denies worsening reflux, abd pain, dysphagia, n/v, bowel change or blood.       Wt Readings from Last 3 Encounters:  07/04/24 253 lb 3.2 oz (114.9 kg)  04/25/24 253 lb (114.8 kg)  01/04/24 254 lb (115.2 kg)   BP Readings from Last 3 Encounters:  07/04/24 136/82  05/01/24 (!) 180/64  01/27/24 (!) 144/78         Past Medical History:  Diagnosis Date   Anemia    Ankle fracture, right    x 2   Antineoplastic and immunosuppressive drugs causing adverse effect in therapeutic use    Asthma    Chronic venous hypertension without complications    Colon polyps 2009   Coronary artery disease    no CAD by cath 11.2010   Depressive disorder, not elsewhere classified    Diabetes mellitus type 2 in obese    hgb AIC 6.1 09/02/2009, insulin  dependent    Diastolic CHF, chronic (HCC)    reports stable   Diverticulosis of colon 2009   colonoscopy   Gout    cortisone  shots helped   Hepatitis, unspecified    hx of/per pt, never was told of having hepatitis   Hx of hysterectomy 2002   Hyperlipidemia    Hypertension    controlled on medication     Hypothyroidism    reports her thryoid levels are normal now    Irritable bowel syndrome    Kidney disease    Lung nodule    12mm RLL nodule on CT Chest  07/2009, resolved by 2011 CT   Noncompliance    Obesity    Persistent atrial fibrillation (HCC)    s/p DCCV 07/2009; Pradaxa  Rx, states she is in sinus rhythm now    PONV (postoperative nausea and vomiting)    after receiving too much anesthesia after a hernia surgery    Rapid atrial fibrillation (HCC) 10/06/2023   Sarcoidosis    dx by eye exam; seen during eye exam , report asymtptomatic i dont have it now'    Sleep disturbance 06/2013   sleep study, mild abnormality, no criteria for CPAP   Past Surgical History:  Procedure Laterality Date   ABDOMINAL HYSTERECTOMY     CARDIOVERSION     x 2   CARDIOVERSION N/A 02/27/2013   Procedure: CARDIOVERSION;  Surgeon: Redell GORMAN Shallow, MD;  Location: [facility] ENDOSCOPY;  Service: Cardiovascular;  Laterality: N/A;   CARDIOVERSION N/A 03/24/2017   Procedure: CARDIOVERSION;  Surgeon: Delford Maude BROCKS, MD;  Location: [facility] ENDOSCOPY;  Service: Cardiovascular;  Laterality: N/A;  CARDIOVERSION N/A 03/04/2020   Procedure: CARDIOVERSION;  Surgeon: Delford Maude BROCKS, MD;  Location: [facility] ENDOSCOPY;  Service: Cardiovascular;  Laterality: N/A;   CARDIOVERSION N/A 06/04/2023   Procedure: CARDIOVERSION;  Surgeon: Barbaraann Darryle Ned, MD;  Location: [facility] INVASIVE CV LAB;  Service: Cardiovascular;  Laterality: N/A;   CARDIOVERSION N/A 08/16/2023   Procedure: CARDIOVERSION;  Surgeon: Mona Vinie BROCKS, MD;  Location: [facility] INVASIVE CV LAB;  Service: Cardiovascular;  Laterality: N/A;   CARDIOVERSION N/A 01/04/2024   Procedure: CARDIOVERSION;  Surgeon: Sheena Pugh, DO;  Location: [facility] INVASIVE CV LAB;  Service: Cardiovascular;  Laterality: N/A;   CHOLECYSTECTOMY  early 80s   COLONOSCOPY     Diverticulosis, colon polyps, repeat 2014, Dr. Debrah   COLONOSCOPY WITH PROPOFOL  N/A 07/25/2019   Procedure: COLONOSCOPY WITH PROPOFOL ;   Surgeon: Leigh Elspeth SQUIBB, MD;  Location: [facility] ENDOSCOPY;  Service: Gastroenterology;  Laterality: N/A;   ESOPHAGOGASTRODUODENOSCOPY N/A 05/16/2014   Procedure: ESOPHAGOGASTRODUODENOSCOPY (EGD);  Surgeon: Lesta JULIANNA Fitz, MD;  Location: [facility] ORS;  Service: Gastroenterology;  Laterality: N/A;   HERNIA REPAIR  2009   umbilical hernia   MYOMECTOMY  2000   POLYPECTOMY  07/25/2019   Procedure: POLYPECTOMY;  Surgeon: Leigh Elspeth SQUIBB, MD;  Location: [facility] ENDOSCOPY;  Service: Gastroenterology;;    reports that she has never smoked. She has never used smokeless tobacco. She reports current alcohol use. She reports that she does not use drugs. family history includes Cancer in her father; Emphysema in an other family member; Hypertension in her father; Multiple sclerosis in her mother; Pneumonia in her father. Allergies  Allergen Reactions   Amiodarone  Other (See Comments)    Tremor/gait disurbance   Aspirin  Other (See Comments)    Burns my stomach   Methimazole  Nausea And Vomiting   Propoxyphene Nausea And Vomiting   Trintellix  [Vortioxetine ] Nausea And Vomiting   Fentanyl  Nausea And Vomiting   Albuterol  Palpitations   Lipitor [Atorvastatin] Other (See Comments)    Body aches and pains--stiffness.    Livalo  [Pitavastatin ] Other (See Comments)    Body aches   Current Outpatient Medications on File Prior to Visit  Medication Sig Dispense Refill   Accu-Chek FastClix Lancets MISC      allopurinol  (ZYLOPRIM ) 100 MG tablet TAKE 1/2 TABLET(50 MG) BY MOUTH TWICE DAILY (Patient taking differently: Take 100 mg by mouth daily.) 90 tablet 3   amiodarone  (PACERONE ) 200 MG tablet Take 1 tablet (200 mg total) by mouth 2 (two) times daily for 30 days, THEN 1 tablet (200 mg total) daily. (Patient taking differently: Take 1 tablet (200 mg total) Sometimes take and additional 200 mg if needed) 90 tablet 3   Blood Pressure KIT 1 kit by Does not apply route daily. Check blood pressure once a day 1 kit 0    colchicine  0.6 MG tablet Take 1 tablet (0.6 mg total) by mouth daily. (Patient taking differently: Take 0.6 mg by mouth daily as needed.) 90 tablet 0   dabigatran  (PRADAXA ) 150 MG CAPS capsule TAKE 1 CAPSULE(150 MG) BY MOUTH TWICE DAILY 180 capsule 1   furosemide  (LASIX ) 40 MG tablet Take 1 tablet (40 mg total) by mouth daily. (Patient taking differently: Take 40 mg by mouth daily as needed for edema or fluid.) 90 tablet 1   JARDIANCE  25 MG TABS tablet TAKE 1 TABLET(25 MG) BY MOUTH DAILY 90 tablet 1   KERENDIA  20 MG TABS TAKE 1 TABLET(20 MG) BY MOUTH DAILY 90 tablet 0   metoprolol  succinate (TOPROL -XL) 50 MG 24 hr  tablet Take 1 tablet (50 mg total) by mouth daily. 90 tablet 3   polyethylene glycol (MIRALAX  / GLYCOLAX ) 17 g packet Take 17 g by mouth 2 (two) times daily. Titrate as needed 60 packet 3   sacubitril -valsartan  (ENTRESTO ) 24-26 MG TAKE 1 TABLET BY MOUTH TWICE DAILY 180 tablet 3   simvastatin  (ZOCOR ) 10 MG tablet TAKE 1 TABLET(10 MG) BY MOUTH AT BEDTIME 90 tablet 0   terbinafine (LAMISIL) 1 % cream Apply 1 Application topically 2 (two) times daily as needed (skin irritation/itching.).     tirzepatide  (MOUNJARO ) 5 MG/0.5ML Pen Inject 5 mg into the skin once a week. 6 mL 0   No current facility-administered medications on file prior to visit.        ROS:  All others reviewed and negative.  Objective        PE:  BP 136/82   Pulse (!) 58   Temp 99.1 F (37.3 C)   Ht 5' 3 (1.6 m)   Wt 253 lb 3.2 oz (114.9 kg)   SpO2 98%   BMI 44.85 kg/m                 Constitutional: Pt appears mild ill               HENT: Head: NCAT.                Right Ear: External ear normal.                 Left Ear: External ear normal. Bilat tm's with mild erythema.  Max sinus areas non tender.  Pharynx with mild erythema, no exudate               Eyes: . Pupils are equal, round, and reactive to light. Conjunctivae and EOM are normal               Nose: without d/c or deformity               Neck: Neck  supple. Gross normal ROM               Cardiovascular: Normal rate and regular rhythm.                 Pulmonary/Chest: Effort normal and breath sounds decresaed without rales or wheezing.                Abd:  Soft, NT, ND, + BS, no organomegaly               Neurological: Pt is alert. At baseline orientation, motor grossly intact               Skin: Skin is warm. No rashes, no other new lesions, LE edema - none               Psychiatric: Pt behavior is normal without agitation   Micro: none  Cardiac tracings I have personally interpreted today:  none  Pertinent Radiological findings (summarize): none   Lab Results  Component Value Date   WBC 6.0 04/30/2024   HGB 10.1 (L) 04/30/2024   HCT 32.0 (L) 04/30/2024   PLT 245 04/30/2024   GLUCOSE 93 04/30/2024   CHOL 208 (H) 04/19/2023   TRIG 117.0 04/19/2023   HDL 59.70 04/19/2023   LDLCALC 125 (H) 04/19/2023   ALT 8 04/26/2024   AST 13 (L) 04/26/2024   NA 138 04/30/2024   K 3.6 04/30/2024   CL 104 04/30/2024  CREATININE 1.14 (H) 04/30/2024   BUN 12 04/30/2024   CO2 24 04/30/2024   TSH 2.45 12/02/2023   INR 1.1 04/26/2024   HGBA1C 5.2 12/02/2023   MICROALBUR 14.13 02/12/2022   POCT - Covid - positive                Flu - neg  Assessment/Plan:  Trenisha Lafavor is a 73 y.o. Black or African American [2] female with  has a past medical history of Anemia, Ankle fracture, right, Antineoplastic and immunosuppressive drugs causing adverse effect in therapeutic use, Asthma, Chronic venous hypertension without complications, Colon polyps (2009), Coronary artery disease, Depressive disorder, not elsewhere classified, Diabetes mellitus type 2 in obese, Diastolic CHF, chronic (HCC), Diverticulosis of colon (2009), Gout, Hepatitis, unspecified, hysterectomy (2002), Hyperlipidemia, Hypertension, Hypothyroidism, Irritable bowel syndrome, Kidney disease, Lung nodule, Noncompliance, Obesity, Persistent atrial fibrillation (HCC), PONV  (postoperative nausea and vomiting), Rapid atrial fibrillation (HCC) (10/06/2023), Sarcoidosis, and Sleep disturbance (06/2013).  COVID-19 virus infection Mild to mod, for antibx course - paxlovid  renal dosed, cough med prn,  to f/u any worsening symptoms or concerns   B12 deficiency Lab Results  Component Value Date   VITAMINB12 206 (L) 03/18/2022   Low, to start oral replacement - b12 1000 mcg qd   Chronic diastolic heart failure (HCC) Stable volume,  to f/u any worsening symptoms or concerns  CKD stage 3a, GFR 45-59 ml/min (HCC) Lab Results  Component Value Date   CREATININE 1.14 (H) 04/30/2024   Stable overall, cont to avoid nephrotoxins, and f/u lab today  Dyslipidemia, goal LDL below 100 Lab Results  Component Value Date   LDLCALC 125 (H) 04/19/2023   Uncontrolled,, pt to continue current statin zocor  10 mg but check lipids today   Essential hypertension BP Readings from Last 3 Encounters:  07/04/24 136/82  05/01/24 (!) 180/64  01/27/24 (!) 144/78   Stable, pt to continue medical treatment toprol  xl 50 qd   GAD (generalized anxiety disorder) Overall stable, for xanax  refill, f/u PCP  Non-insulin  dependent type 2 diabetes mellitus (HCC) Lab Results  Component Value Date   HGBA1C 5.2 12/02/2023   Stable, pt to continue current medical treatment jardiance  25 every day, mounjaro  5 mg weekly, for f/u A1c today   Rectal abscess Clinically resolved, no further antibx needed  Followup: Return in about 6 months (around 01/01/2025).  Lynwood Rush, MD 07/04/2024 10:11 AM [facility]

## 2024-07-04 NOTE — Assessment & Plan Note (Signed)
 Lab Results  Component Value Date   HGBA1C 5.2 12/02/2023   Stable, pt to continue current medical treatment jardiance  25 every day, mounjaro  5 mg weekly, for f/u A1c today

## 2024-07-04 NOTE — Assessment & Plan Note (Signed)
Stable volume,  to f/u any worsening symptoms or concerns 

## 2024-07-11 ENCOUNTER — Encounter (HOSPITAL_COMMUNITY): Payer: Self-pay

## 2024-07-11 ENCOUNTER — Ambulatory Visit (HOSPITAL_COMMUNITY)
Admission: RE | Admit: 2024-07-11 | Discharge: 2024-07-11 | Disposition: A | Discharge status: Home / Self Care | Source: Ambulatory Visit | Attending: Family Medicine | Admitting: Family Medicine

## 2024-07-11 VITALS — BP 183/75 | HR 60 | Temp 98.5°F | Resp 18

## 2024-07-11 DIAGNOSIS — N611 Abscess of the breast and nipple: Secondary | ICD-10-CM | POA: Diagnosis not present

## 2024-07-11 DIAGNOSIS — L02211 Cutaneous abscess of abdominal wall: Secondary | ICD-10-CM

## 2024-07-11 MED ORDER — TRIAMCINOLONE ACETONIDE 0.1 % EX CREA: 1.0000 | TOPICAL_CREAM | Freq: Two times a day (BID) | CUTANEOUS | 0 refills | Status: AC

## 2024-07-11 MED ORDER — AMOXICILLIN-POT CLAVULANATE 875-125 MG PO TABS: 1.0000 | ORAL_TABLET | Freq: Two times a day (BID) | ORAL | 0 refills | Status: DC

## 2024-07-11 NOTE — ED Triage Notes (Signed)
 Pt states she had covid last week and was given antivirals, she now has blisters under her breast and on her stomach. She wonders if it is due to the meds. She took 2 advil  for the pain.

## 2024-07-11 NOTE — ED Provider Notes (Signed)
 [facility]    CSN: [id number] Arrival date & time: 07/11/24  1309      History   Chief Complaint Chief Complaint  Patient presents with   Allergic Reaction    HPI Reise Hietala is a 73 y.o. female.    Allergic Reaction  She was seen last week for covid,given the paxlovid .  She stopped the medication when she noted blisters under her breast.  She noted blisters under the breasts bilaterally, and around her stomach.   They are painful, sore.  She has been using triamcinolone  0.1% cream with little help. She would like a refill of this if possible.       Past Medical History:  Diagnosis Date   Anemia    Ankle fracture, right    x 2   Antineoplastic and immunosuppressive drugs causing adverse effect in therapeutic use    Asthma    Chronic venous hypertension without complications    Colon polyps 2009   Coronary artery disease    no CAD by cath 11.2010   Depressive disorder, not elsewhere classified    Diabetes mellitus type 2 in obese    hgb AIC 6.1 09/02/2009, insulin  dependent    Diastolic CHF, chronic (HCC)    reports stable   Diverticulosis of colon 2009   colonoscopy   Gout    cortisone  shots helped   Hepatitis, unspecified    hx of/per pt, never was told of having hepatitis   Hx of hysterectomy 2002   Hyperlipidemia    Hypertension    controlled on medication    Hypothyroidism    reports her thryoid levels are normal now    Irritable bowel syndrome    Kidney disease    Lung nodule    12mm RLL nodule on CT Chest  07/2009, resolved by 2011 CT   Noncompliance    Obesity    Persistent atrial fibrillation (HCC)    s/p DCCV 07/2009; Pradaxa  Rx, states she is in sinus rhythm now    PONV (postoperative nausea and vomiting)    after receiving too much anesthesia after a hernia surgery    Rapid atrial fibrillation (HCC) 10/06/2023   Sarcoidosis    dx by eye exam; seen during eye exam , report asymtptomatic i dont have it now'    Sleep  disturbance 06/2013   sleep study, mild abnormality, no criteria for CPAP    Patient Active Problem List   Diagnosis Date Noted   COVID-19 virus infection 07/04/2024   Rectal abscess 04/26/2024   History of atrial fibrillation 04/26/2024   QT prolongation 04/26/2024   Acute cough 12/02/2023   Chronic eczematous otitis externa of left ear 08/24/2023   Non-insulin  dependent type 2 diabetes mellitus (HCC) 04/20/2023   CKD stage 3a, GFR 45-59 ml/min (HCC) 11/11/2022   Full incontinence of feces 11/10/2022   Acute diverticulitis 06/19/2022   Picker's nodules 06/10/2022   Primary osteoarthritis involving multiple joints 06/10/2022   Dyslipidemia, goal LDL below 100 06/10/2022   Diuretic-induced hypokalemia 06/10/2022   Long term (current) use of insulin  (HCC) 03/18/2022   GAD (generalized anxiety disorder) 03/18/2022   Current severe episode of major depressive disorder without psychotic features without prior episode (HCC) 03/18/2022   Visit for screening mammogram 03/18/2022   Secondary hypercoagulable state 09/04/2020   NICM (nonischemic cardiomyopathy) (HCC) 12/20/2019   Benign neoplasm of ascending colon    Degenerative arthritis of knee, bilateral 12/22/2017   Typical atrial flutter (HCC)    Chronic  systolic CHF (congestive heart failure) (HCC) 10/18/2016   B12 deficiency 05/06/2016   Other specified nutritional anemias 08/02/2015   Obesity 08/02/2015   Gout flare 08/02/2015   Cardiomyopathy, secondary (HCC) 01/28/2011   Chronic diastolic heart failure (HCC) 09/27/2009   Essential hypertension 09/02/2009   Atrial fibrillation (HCC) 08/20/2009   Lung nodule 08/20/2009   CHRONIC VENOUS HYPERTENSION WITHOUT COMPS 11/21/2007    Past Surgical History:  Procedure Laterality Date   ABDOMINAL HYSTERECTOMY     CARDIOVERSION     x 2   CARDIOVERSION N/A 02/27/2013   Procedure: CARDIOVERSION;  Surgeon: Redell GORMAN Shallow, MD;  Location: [facility] ENDOSCOPY;  Service: Cardiovascular;   Laterality: N/A;   CARDIOVERSION N/A 03/24/2017   Procedure: CARDIOVERSION;  Surgeon: Delford Maude BROCKS, MD;  Location: [facility] ENDOSCOPY;  Service: Cardiovascular;  Laterality: N/A;   CARDIOVERSION N/A 03/04/2020   Procedure: CARDIOVERSION;  Surgeon: Delford Maude BROCKS, MD;  Location: [facility] ENDOSCOPY;  Service: Cardiovascular;  Laterality: N/A;   CARDIOVERSION N/A 06/04/2023   Procedure: CARDIOVERSION;  Surgeon: Barbaraann Darryle Ned, MD;  Location: [facility] INVASIVE CV LAB;  Service: Cardiovascular;  Laterality: N/A;   CARDIOVERSION N/A 08/16/2023   Procedure: CARDIOVERSION;  Surgeon: Mona Vinie BROCKS, MD;  Location: [facility] INVASIVE CV LAB;  Service: Cardiovascular;  Laterality: N/A;   CARDIOVERSION N/A 01/04/2024   Procedure: CARDIOVERSION;  Surgeon: Sheena Pugh, DO;  Location: [facility] INVASIVE CV LAB;  Service: Cardiovascular;  Laterality: N/A;   CHOLECYSTECTOMY  early 80s   COLONOSCOPY     Diverticulosis, colon polyps, repeat 2014, Dr. Debrah   COLONOSCOPY WITH PROPOFOL  N/A 07/25/2019   Procedure: COLONOSCOPY WITH PROPOFOL ;  Surgeon: Leigh Elspeth SQUIBB, MD;  Location: [facility] ENDOSCOPY;  Service: Gastroenterology;  Laterality: N/A;   ESOPHAGOGASTRODUODENOSCOPY N/A 05/16/2014   Procedure: ESOPHAGOGASTRODUODENOSCOPY (EGD);  Surgeon: Lesta JULIANNA Fitz, MD;  Location: [facility] ORS;  Service: Gastroenterology;  Laterality: N/A;   HERNIA REPAIR  2009   umbilical hernia   MYOMECTOMY  2000   POLYPECTOMY  07/25/2019   Procedure: POLYPECTOMY;  Surgeon: Leigh Elspeth SQUIBB, MD;  Location: [facility] ENDOSCOPY;  Service: Gastroenterology;;    OB History   No obstetric history on file.      Home Medications    Prior to Admission medications   Medication Sig Start Date End Date Taking? Authorizing Provider  Accu-Chek FastClix Lancets MISC  09/26/18  Yes [provider]  allopurinol  (ZYLOPRIM ) 100 MG tablet TAKE 1/2 TABLET(50 MG) BY MOUTH TWICE DAILY Patient taking differently: Take 100 mg by mouth daily. 08/27/22  Yes Joane Artist GORMAN, MD   ALPRAZolam  (XANAX ) 0.5 MG tablet Take 1 tablet (0.5 mg total) by mouth 3 (three) times daily as needed for anxiety. 07/04/24  Yes Norleen Lynwood ORN, MD  amiodarone  (PACERONE ) 200 MG tablet Take 1 tablet (200 mg total) by mouth 2 (two) times daily for 30 days, THEN 1 tablet (200 mg total) daily. Patient taking differently: Take 1 tablet (200 mg total) Sometimes take and additional 200 mg if needed 08/23/23 09/21/24 Yes Terra Fairy PARAS, PA-C  Blood Pressure KIT 1 kit by Does not apply route daily. Check blood pressure once a day 03/11/21  Yes Fernande Elspeth BROCKS, MD  colchicine  0.6 MG tablet Take 1 tablet (0.6 mg total) by mouth daily. Patient taking differently: Take 0.6 mg by mouth daily as needed. 12/03/23  Yes Joshua Ned CROME, MD  dabigatran  (PRADAXA ) 150 MG CAPS capsule TAKE 1 CAPSULE(150 MG) BY MOUTH TWICE DAILY 05/10/24  Yes Fernande Elspeth BROCKS, MD  furosemide  (LASIX ) 40 MG tablet Take 1 tablet (40 mg total) by mouth daily. Patient taking differently: Take 40 mg by mouth daily as needed for edema or fluid. 01/11/23  Yes Joshua Debby CROME, MD  HYDROcodone  bit-homatropine [facility]) 5-1.5 MG/5ML syrup Take 5 mLs by mouth every 6 (six) hours as needed for up to 10 days. 07/04/24 07/14/24 Yes Norleen Lynwood ORN, MD  JARDIANCE  25 MG TABS tablet TAKE 1 TABLET(25 MG) BY MOUTH DAILY 03/06/24  Yes Joshua Debby CROME, MD  KERENDIA  20 MG TABS TAKE 1 TABLET(20 MG) BY MOUTH DAILY 05/10/24  Yes Joshua Debby CROME, MD  metoprolol  succinate (TOPROL -XL) 50 MG 24 hr tablet Take 1 tablet (50 mg total) by mouth daily. 12/16/23  Yes Fernande Elspeth BROCKS, MD  polyethylene glycol (MIRALAX  / GLYCOLAX ) 17 g packet Take 17 g by mouth 2 (two) times daily. Titrate as needed 10/07/23  Yes Armbruster, Elspeth SQUIBB, MD  sacubitril -valsartan  (ENTRESTO ) 24-26 MG TAKE 1 TABLET BY MOUTH TWICE DAILY 06/07/23  Yes Fernande Elspeth BROCKS, MD  simvastatin  (ZOCOR ) 10 MG tablet TAKE 1 TABLET(10 MG) BY MOUTH AT BEDTIME 03/09/24  Yes Webb, Padonda B, FNP  terbinafine (LAMISIL) 1 % cream  Apply 1 Application topically 2 (two) times daily as needed (skin irritation/itching.).   Yes [provider]  tirzepatide  (MOUNJARO ) 5 MG/0.5ML Pen Inject 5 mg into the skin once a week. 12/28/23  Yes Joshua Debby CROME, MD    Family History Family History  Problem Relation Age of Onset   Multiple sclerosis Mother        hx   Pneumonia Father        deceased   Hypertension Father    Cancer Father    Emphysema Other        runs in the family   Colon cancer Neg Hx    Colon polyps Neg Hx    Esophageal cancer Neg Hx    Stomach cancer Neg Hx    Rectal cancer Neg Hx     Social History Social History   Tobacco Use   Smoking status: Never   Smokeless tobacco: Never   Tobacco comments:    Never smoke 01/27/22  Vaping Use   Vaping status: Never Used  Substance Use Topics   Alcohol use: Yes    Alcohol/week: 0.0 standard drinks of alcohol    Comment: rare   Drug use: No     Allergies   Amiodarone , Aspirin , Methimazole , Propoxyphene, Trintellix  [vortioxetine ], Fentanyl , Albuterol , Lipitor [atorvastatin], and Livalo  [pitavastatin ]   Review of Systems Review of Systems  Constitutional: Negative.   HENT: Negative.    Respiratory: Negative.    Cardiovascular: Negative.   Gastrointestinal: Negative.   Skin:  Positive for wound.     Physical Exam Triage Vital Signs ED Triage Vitals  Encounter Vitals Group     BP 07/11/24 1330 (!) 183/75     Girls Systolic BP Percentile --      Girls Diastolic BP Percentile --      Boys Systolic BP Percentile --      Boys Diastolic BP Percentile --      Pulse Rate 07/11/24 1330 60     Resp 07/11/24 1330 18     Temp 07/11/24 1330 98.5 F (36.9 C)     Temp Source 07/11/24 1330 Oral     SpO2 07/11/24 1330 96 %     Weight --      Height --      Head Circumference --  Peak Flow --      Pain Score 07/11/24 1328 6     Pain Loc --      Pain Education --      Exclude from Growth Chart --    No data found.  Updated Vital  Signs BP (!) 183/75 (BP Location: Left Arm)   Pulse 60   Temp 98.5 F (36.9 C) (Oral)   Resp 18   SpO2 96%   Visual Acuity Right Eye Distance:   Left Eye Distance:   Bilateral Distance:    Right Eye Near:   Left Eye Near:    Bilateral Near:     Physical Exam Constitutional:      Appearance: Normal appearance.  Skin:    Comments: There is a single pimple like lesion to the right upper abdomen;  tender;  no drainage;   Another single pimple like lesion to the left upper abdomen;  tender;  no drainage;  To the left lateral breast is an area of fullness, tenderness;  slightly red, inflamed;  no open wound noted  Neurological:     Mental Status: She is alert.      UC Treatments / Results  Labs (all labs ordered are listed, but only abnormal results are displayed) Labs Reviewed - No data to display  EKG   Radiology No results found.  Procedures Procedures (including critical care time)  Medications Ordered in UC Medications - No data to display  Initial Impression / Assessment and Plan / UC Course  I have reviewed the triage vital signs and the nursing notes.  Pertinent labs & imaging results that were available during my care of the patient were reviewed by me and considered in my medical decision making (see chart for details).   Final Clinical Impressions(s) / UC Diagnoses   Final diagnoses:  Abscess of breast  Abscess of skin of abdomen     Discharge Instructions      You were seen today for sores/abscesses to the skin.  I have sent out an oral antibiotic to treat this.  I have refilled the triamcinolone  as well.  Please use a warm compresses to the areas of pain.  Please return if not improving or worsening despite treatment.     ED Prescriptions     Medication Sig Dispense Auth. Provider   amoxicillin -clavulanate (AUGMENTIN ) 875-125 MG tablet Take 1 tablet by mouth every 12 (twelve) hours. 14 tablet Ronell Duffus, MD   triamcinolone  cream  (KENALOG ) 0.1 % Apply 1 Application topically 2 (two) times daily. 30 g Darral Longs, MD      PDMP not reviewed this encounter.   Darral Longs, MD 07/11/24 1400

## 2024-07-11 NOTE — Discharge Instructions (Addendum)
 You were seen today for sores/abscesses to the skin.  I have sent out an oral antibiotic to treat this.  I have refilled the triamcinolone  as well.  Please use a warm compresses to the areas of pain.  Please return if not improving or worsening despite treatment.

## 2024-07-12 NOTE — Progress Notes (Unsigned)
 Electrophysiology Office Note:   Date:  07/14/2024  ID:  Stephanie [name], DOB 07/07/1951, MRN [id number]  Primary Cardiologist: None Primary Heart Failure: None Electrophysiologist: Elspeth Sage, MD      History of Present Illness:   Stephanie [name] is a 73 y.o. female with h/o AF, cardiomyopathy, HFpEF, HTN, HLD, depression, sarcoidosis (dx by eye exam), hypothyroidism, DM II seen today for routine electrophysiology followup.   Since last being seen in our clinic the patient reports doing she recently had COVID and took Paxlovid .  She had to stop as she developed skin lesions and was seen in the ER. She states she has gait instability on amiodarone .  States she looks like she is drunk at times. Reports compliance with Pradaxa .      She denies chest pain, palpitations, dyspnea, PND, orthopnea, nausea, vomiting, dizziness, syncope, edema, weight gain, or early satiety.   Review of systems complete and found to be negative unless listed in HPI.   EP Information / Studies Reviewed:    EKG is ordered today. Personal review as below.  EKG Interpretation Date/Time:  Friday July 14 2024 08:50:44 EDT Ventricular Rate:  53 PR Interval:  218 QRS Duration:  120 QT Interval:  492 QTC Calculation: 461 R Axis:   -9  Text Interpretation: Sinus bradycardia with 1st degree A-V block Confirmed by Stephanie [name] ([id number]) on 07/14/2024 8:59:43 AM    Arrhythmia / AAD / Pertinent EP Studies AF  Amiodarone  10/2023 > pt stopped 07/11/24 due to balance issues   Risk Assessment/Calculations:    CHA2DS2-VASc Score = 6   This indicates a 9.7% annual risk of stroke. The patient's score is based upon: CHF History: 1 HTN History: 1 Diabetes History: 1 Stroke History: 0 Vascular Disease History: 1 Age Score: 1 Gender Score: 1             Physical Exam:   VS:  BP 138/62   Pulse (!) 53   Ht 5' 3 (1.6 m)   Wt 253 lb (114.8 kg)   SpO2 97%   BMI 44.82 kg/m    Wt Readings from Last 3  Encounters:  07/14/24 253 lb (114.8 kg)  07/04/24 253 lb 3.2 oz (114.9 kg)  04/25/24 253 lb (114.8 kg)     GEN: Well nourished, well developed in no acute distress NECK: No JVD; No carotid bruits CARDIAC: Regular rate and rhythm, no murmurs, rubs, gallops RESPIRATORY:  Clear to auscultation without rales, wheezing or rhonchi  ABDOMEN: Soft, non-tender, non-distended EXTREMITIES:  No edema; No deformity   ASSESSMENT AND PLAN:    Persistent Atrial Fibrillation  CHA2DS2-VASc 6  -OAC for stroke prophylaxis -reduce amiodarone  to 100 mg daily M-F -LFT's, TSH wnl 06/2024 per PCP -Toprol  50 mg daily  -EKG with NSR, stable intervals on amiodarone    Secondary Hypercoagulable State  -continue Pradaxa    NICM  -euvolemic on exam  -GDMT per Cardiology   Hypertension  -well controlled on current regimen    Sarcoidosis  Dx by eye exam  -per primary  -new skin lesions > encouraged her to discuss with Dermatology. May be related to recent viral illness or could be cutaneous sarcoidosis    Follow up with Dr. Kennyth / EP APP in 3 months to review amio labs & balance issues.  Discussed transfer to Dr. Kennyth as new EP.    Pt requested medical transportation bus approval to be filled out. Completed in clinic.    Signed, [name] Aniceto, NP-C, AGACNP-BC [facility] -  Electrophysiology  07/14/2024, 10:45 AM

## 2024-07-14 ENCOUNTER — Encounter: Payer: Self-pay | Admitting: Pulmonary Disease

## 2024-07-14 ENCOUNTER — Ambulatory Visit: Attending: Pulmonary Disease | Admitting: Pulmonary Disease

## 2024-07-14 VITALS — BP 138/62 | HR 53 | Ht 63.0 in | Wt 253.0 lb

## 2024-07-14 DIAGNOSIS — D6869 Other thrombophilia: Secondary | ICD-10-CM | POA: Diagnosis not present

## 2024-07-14 DIAGNOSIS — I10 Essential (primary) hypertension: Secondary | ICD-10-CM | POA: Diagnosis not present

## 2024-07-14 DIAGNOSIS — I428 Other cardiomyopathies: Secondary | ICD-10-CM

## 2024-07-14 DIAGNOSIS — I4819 Other persistent atrial fibrillation: Secondary | ICD-10-CM | POA: Diagnosis not present

## 2024-07-14 MED ORDER — AMIODARONE HCL 200 MG PO TABS: ORAL_TABLET | ORAL | Status: DC

## 2024-07-14 MED ORDER — AMIODARONE HCL 200 MG PO TABS: ORAL_TABLET | ORAL | 3 refills | Status: DC

## 2024-07-14 NOTE — Patient Instructions (Addendum)
 Medication Instructions:  Decrease amiodarone  to 100 mg (1/2 tablet) daily Monday through Friday only, do not take any on Saturday or Sunday *If you need a refill on your cardiac medications before your next appointment, please call your pharmacy*  Lab Work: None ordered If you have labs (blood work) drawn today and your tests are completely normal, you will receive your results only by: MyChart Message (if you have MyChart) OR A paper copy in the mail If you have any lab test that is abnormal or we need to change your treatment, we will call you to review the results.  Follow-Up: At [facility], you and your health needs are our priority.  As part of our continuing mission to provide you with exceptional heart care, our providers are all part of one team.  This team includes your primary Cardiologist (physician) and Advanced Practice Providers or APPs (Physician Assistants and Nurse Practitioners) who all work together to provide you with the care you need, when you need it.  Your next appointment:   3 month(s)  Provider:   Fonda Kitty, MD or Daphne Barrack, NP    We recommend signing up for the patient portal called MyChart.  Sign up information is provided on this After Visit Summary.  MyChart is used to connect with patients for Virtual Visits (Telemedicine).  Patients are able to view lab/test results, encounter notes, upcoming appointments, etc.  Non-urgent messages can be sent to your provider as well.   To learn more about what you can do with MyChart, go to [email or web address].

## 2024-08-29 ENCOUNTER — Encounter: Payer: Self-pay | Admitting: *Deleted

## 2024-08-29 NOTE — Progress Notes (Signed)
 Stephanie [name]                                          MRN: [id number]   08/29/2024   The VBCI Quality Team Specialist reviewed this patient medical record for the purposes of chart review for care gap closure. The following were reviewed: abstraction for care gap closure-glycemic status assessment.    VBCI Quality Team

## 2024-09-07 ENCOUNTER — Ambulatory Visit: Payer: 59

## 2024-09-07 VITALS — Ht 63.0 in | Wt 253.0 lb

## 2024-09-07 DIAGNOSIS — Z780 Asymptomatic menopausal state: Secondary | ICD-10-CM

## 2024-09-07 DIAGNOSIS — Z0000 Encounter for general adult medical examination without abnormal findings: Secondary | ICD-10-CM

## 2024-09-07 NOTE — Progress Notes (Signed)
 Chief Complaint  Patient presents with   Medicare Wellness     Subjective:   Stephanie [name] is a 73 y.o. female who presents for a Medicare Annual Wellness Visit.  I connected with  Norvel Public on 09/07/24 by a audio enabled telemedicine application and verified that I am speaking with the correct person using two identifiers.  Patient Location: Home  Provider Location: Office/Clinic  Persons Participating in Visit: Patient.  I discussed the limitations of evaluation and management by telemedicine. The patient expressed understanding and agreed to proceed.  Vital Signs: Because this visit was a virtual/telehealth visit, some criteria may be missing or patient reported. Any vitals not documented were not able to be obtained and vitals that have been documented are patient reported.   Allergies (verified) Amiodarone , Aspirin , Methimazole , Propoxyphene, Trintellix  [vortioxetine ], Fentanyl , Albuterol , Lipitor [atorvastatin], and Livalo  [pitavastatin ]   History: Past Medical History:  Diagnosis Date   Anemia    Ankle fracture, right    x 2   Antineoplastic and immunosuppressive drugs causing adverse effect in therapeutic use    Asthma    Chronic venous hypertension without complications    Colon polyps 2009   Coronary artery disease    no CAD by cath 11.2010   Depressive disorder, not elsewhere classified    Diabetes mellitus type 2 in obese    hgb AIC 6.1 09/02/2009, insulin  dependent    Diastolic CHF, chronic (HCC)    reports stable   Diverticulosis of colon 2009   colonoscopy   Gout    cortisone  shots helped   Hepatitis, unspecified    hx of/per pt, never was told of having hepatitis   Hx of hysterectomy 2002   Hyperlipidemia    Hypertension    controlled on medication    Hypothyroidism    reports her thryoid levels are normal now    Irritable bowel syndrome    Kidney disease    Lung nodule    12mm RLL nodule on CT Chest  07/2009, resolved by 2011 CT    Noncompliance    Obesity    Persistent atrial fibrillation (HCC)    s/p DCCV 07/2009; Pradaxa  Rx, states she is in sinus rhythm now    PONV (postoperative nausea and vomiting)    after receiving too much anesthesia after a hernia surgery    Rapid atrial fibrillation (HCC) 10/06/2023   Sarcoidosis    dx by eye exam; seen during eye exam , report asymtptomatic i dont have it now'    Sleep disturbance 06/2013   sleep study, mild abnormality, no criteria for CPAP   Past Surgical History:  Procedure Laterality Date   ABDOMINAL HYSTERECTOMY     CARDIOVERSION     x 2   CARDIOVERSION N/A 02/27/2013   Procedure: CARDIOVERSION;  Surgeon: Redell GORMAN Shallow, MD;  Location: [facility] ENDOSCOPY;  Service: Cardiovascular;  Laterality: N/A;   CARDIOVERSION N/A 03/24/2017   Procedure: CARDIOVERSION;  Surgeon: Delford Maude BROCKS, MD;  Location: [facility] ENDOSCOPY;  Service: Cardiovascular;  Laterality: N/A;   CARDIOVERSION N/A 03/04/2020   Procedure: CARDIOVERSION;  Surgeon: Delford Maude BROCKS, MD;  Location: [facility] ENDOSCOPY;  Service: Cardiovascular;  Laterality: N/A;   CARDIOVERSION N/A 06/04/2023   Procedure: CARDIOVERSION;  Surgeon: Barbaraann Darryle Ned, MD;  Location: [facility] INVASIVE CV LAB;  Service: Cardiovascular;  Laterality: N/A;   CARDIOVERSION N/A 08/16/2023   Procedure: CARDIOVERSION;  Surgeon: Mona Vinie BROCKS, MD;  Location: [facility] INVASIVE CV LAB;  Service: Cardiovascular;  Laterality: N/A;  CARDIOVERSION N/A 01/04/2024   Procedure: CARDIOVERSION;  Surgeon: Sheena Pugh, DO;  Location: [facility] INVASIVE CV LAB;  Service: Cardiovascular;  Laterality: N/A;   CHOLECYSTECTOMY  early 80s   COLONOSCOPY     Diverticulosis, colon polyps, repeat 2014, Dr. Debrah   COLONOSCOPY WITH PROPOFOL  N/A 07/25/2019   Procedure: COLONOSCOPY WITH PROPOFOL ;  Surgeon: Leigh Elspeth SQUIBB, MD;  Location: [facility] ENDOSCOPY;  Service: Gastroenterology;  Laterality: N/A;   ESOPHAGOGASTRODUODENOSCOPY N/A 05/16/2014   Procedure: ESOPHAGOGASTRODUODENOSCOPY  (EGD);  Surgeon: Lesta JULIANNA Fitz, MD;  Location: [facility] ORS;  Service: Gastroenterology;  Laterality: N/A;   HERNIA REPAIR  2009   umbilical hernia   MYOMECTOMY  2000   POLYPECTOMY  07/25/2019   Procedure: POLYPECTOMY;  Surgeon: Leigh Elspeth SQUIBB, MD;  Location: [facility] ENDOSCOPY;  Service: Gastroenterology;;   Family History  Problem Relation Age of Onset   Multiple sclerosis Mother        hx   Pneumonia Father        deceased   Hypertension Father    Cancer Father    Emphysema Other        runs in the family   Colon cancer Neg Hx    Colon polyps Neg Hx    Esophageal cancer Neg Hx    Stomach cancer Neg Hx    Rectal cancer Neg Hx    Social History   Occupational History   Occupation: Disabled.  Tobacco Use   Smoking status: Never   Smokeless tobacco: Never   Tobacco comments:    Never smoke 01/27/22  Vaping Use   Vaping status: Never Used  Substance and Sexual Activity   Alcohol use: Yes    Alcohol/week: 1.0 standard drink of alcohol    Types: 1 Glasses of wine per week    Comment: rare   Drug use: No   Sexual activity: Not Currently    Birth control/protection: Surgical   Tobacco Counseling Counseling given: Not Answered Tobacco comments: Never smoke 01/27/22  SDOH Screenings   Food Insecurity: No Food Insecurity (09/07/2024)  Housing: Unknown (09/07/2024)  Transportation Needs: No Transportation Needs (09/07/2024)  Utilities: Not At Risk (09/07/2024)  Alcohol Screen: Low Risk  (09/06/2023)  Depression (PHQ2-9): Low Risk  (09/07/2024)  Financial Resource Strain: Patient Declined (12/01/2023)  Recent Concern: Financial Resource Strain - Medium Risk (09/06/2023)  Physical Activity: Insufficiently Active (09/07/2024)  Social Connections: Socially Isolated (09/07/2024)  Stress: No Stress Concern Present (09/07/2024)  Tobacco Use: Low Risk  (09/07/2024)  Health Literacy: Adequate Health Literacy (09/07/2024)   See flowsheets for full screening details  Depression  Screen PHQ 2 & 9 Depression Scale- Over the past 2 weeks, how often have you been bothered by any of the following problems? Little interest or pleasure in doing things: 0 Feeling down, depressed, or hopeless (PHQ Adolescent also includes...irritable): 0 PHQ-2 Total Score: 0 Trouble falling or staying asleep, or sleeping too much: 0 Feeling tired or having little energy: 0 Poor appetite or overeating (PHQ Adolescent also includes...weight loss): 0 Feeling bad about yourself - or that you are a failure or have let yourself or your family down: 0 Trouble concentrating on things, such as reading the newspaper or watching television (PHQ Adolescent also includes...like school work): 0 Moving or speaking so slowly that other people could have noticed. Or the opposite - being so fidgety or restless that you have been moving around a lot more than usual: 0 Thoughts that you would be better off dead, or of hurting yourself in  some way: 0 PHQ-9 Total Score: 0  Depression Treatment Depression Interventions/Treatment : Medication; PHQ2-9 Score <4 Follow-up Not Indicated     Goals Addressed               This Visit's Progress     Patient Stated (pt-stated)        Patient stated she plans to walk more and eating more vegetables       Visit info / Clinical Intake: Medicare Wellness Visit Type:: Subsequent Annual Wellness Visit Persons participating in visit:: patient Medicare Wellness Visit Mode:: Telephone If telephone:: video declined Because this visit was a virtual/telehealth visit:: pt reported vitals If Telephone or Video please confirm:: I connected with the patient using audio enabled telemedicine application and verified that I am speaking with the correct person using two identifiers; I discussed the limitations of evaluation and management by telemedicine; The patient expressed understanding and agreed to proceed Patient Location:: Home Provider Location:: Office Information  given by:: patient Interpreter Needed?: No Pre-visit prep was completed: yes AWV questionnaire completed by patient prior to visit?: no Living arrangements:: (!) lives alone Patient's Overall Health Status Rating: good Typical amount of pain: none Does pain affect daily life?: no Are you currently prescribed opioids?: no  Dietary Habits and Nutritional Risks How many meals a day?: 2 Eats fruit and vegetables daily?: yes Most meals are obtained by: preparing own meals In the last 2 weeks, have you had any of the following?: none Diabetic:: (!) yes Any non-healing wounds?: no How often do you check your BS?: 0 Would you like to be referred to a Nutritionist or for Diabetic Management? : no  Functional Status Activities of Daily Living (to include ambulation/medication): Independent Ambulation: Independent with device- listed below Home Assistive Devices/Equipment: Eyeglasses Medication Administration: Independent Home Management: Independent Manage your own finances?: yes Primary transportation is: driving; facility / other Concerns about vision?: no *vision screening is required for WTM* Concerns about hearing?: no  Fall Screening Falls in the past year?: 0 Number of falls in past year: 0 Was there an injury with Fall?: 0 Fall Risk Category Calculator: 0 Patient Fall Risk Level: Low Fall Risk  Fall Risk Patient at Risk for Falls Due to: No Fall Risks  Home and Transportation Safety: All rugs have non-skid backing?: yes All stairs or steps have railings?: yes Grab bars in the bathtub or shower?: yes Have non-skid surface in bathtub or shower?: yes Good home lighting?: yes Regular seat belt use?: yes Hospital stays in the last year:: (!) yes How many hospital stays:: 7 (July 2025) Reason: abdominal pain  Cognitive Assessment Difficulty concentrating, remembering, or making decisions? : no Will 6CIT or Mini Cog be Completed: yes What year is it?: 0 points What  month is it?: 0 points Give patient an address phrase to remember (5 components): [address] [place], [place] About what time is it?: 0 points Count backwards from 20 to 1: 0 points Say the months of the year in reverse: 0 points Repeat the address phrase from earlier: 0 points 6 CIT Score: 0 points  Advance Directives (For Healthcare) Does Patient Have a Medical Advance Directive?: No Would patient like information on creating a medical advance directive?: No - Patient declined  Reviewed/Updated  Reviewed/Updated: Reviewed All (Medical, Surgical, Family, Medications, Allergies, Care Teams, Patient Goals)        Objective:    Today's Vitals   09/07/24 0853  Weight: 253 lb (114.8 kg)  Height: 5' 3 (1.6 m)  Body mass index is 44.82 kg/m.  Current Medications (verified) Outpatient Encounter Medications as of 09/07/2024  Medication Sig   Accu-Chek FastClix Lancets MISC    allopurinol  (ZYLOPRIM ) 100 MG tablet TAKE 1/2 TABLET(50 MG) BY MOUTH TWICE DAILY (Patient taking differently: Take 100 mg by mouth daily.)   ALPRAZolam  (XANAX ) 0.5 MG tablet Take 1 tablet (0.5 mg total) by mouth 3 (three) times daily as needed for anxiety.   amiodarone  (PACERONE ) 200 MG tablet Take 1/2 tablet (100 mg) by mouth Monday through Friday only, do not take any on Saturday or Sunday   amoxicillin -clavulanate (AUGMENTIN ) 875-125 MG tablet Take 1 tablet by mouth every 12 (twelve) hours.   Blood Pressure KIT 1 kit by Does not apply route daily. Check blood pressure once a day   colchicine  0.6 MG tablet Take 1 tablet (0.6 mg total) by mouth daily. (Patient taking differently: Take 0.6 mg by mouth daily as needed.)   dabigatran  (PRADAXA ) 150 MG CAPS capsule TAKE 1 CAPSULE(150 MG) BY MOUTH TWICE DAILY   furosemide  (LASIX ) 40 MG tablet Take 1 tablet (40 mg total) by mouth daily. (Patient taking differently: Take 40 mg by mouth daily. prn)   JARDIANCE  25 MG TABS tablet TAKE 1 TABLET(25 MG) BY MOUTH DAILY    KERENDIA  20 MG TABS TAKE 1 TABLET(20 MG) BY MOUTH DAILY   metoprolol  succinate (TOPROL -XL) 50 MG 24 hr tablet Take 1 tablet (50 mg total) by mouth daily.   polyethylene glycol (MIRALAX  / GLYCOLAX ) 17 g packet Take 17 g by mouth 2 (two) times daily. Titrate as needed   sacubitril -valsartan  (ENTRESTO ) 24-26 MG TAKE 1 TABLET BY MOUTH TWICE DAILY   simvastatin  (ZOCOR ) 10 MG tablet TAKE 1 TABLET(10 MG) BY MOUTH AT BEDTIME   terbinafine (LAMISIL) 1 % cream Apply 1 Application topically 2 (two) times daily as needed (skin irritation/itching.).   tirzepatide  (MOUNJARO ) 5 MG/0.5ML Pen Inject 5 mg into the skin once a week.   triamcinolone  cream (KENALOG ) 0.1 % Apply 1 Application topically 2 (two) times daily.   No facility-administered encounter medications on file as of 09/07/2024.   Hearing/Vision screen Hearing Screening - Comments:: Denies hearing difficulties   Vision Screening - Comments:: Wears eyeglasses for reading - up to date with routine eye exams with Selinda Reusing Immunizations and Health Maintenance Health Maintenance  Topic Date Due   Bone Density Scan  Never done   DTaP/Tdap/Td (2 - Td or Tdap) 08/05/2023   FOOT EXAM  05/23/2024   OPHTHALMOLOGY EXAM  08/12/2024   HEMOGLOBIN A1C  01/01/2025   Diabetic kidney evaluation - eGFR measurement  07/04/2025   Diabetic kidney evaluation - Urine ACR  07/04/2025   Mammogram  07/22/2025   Medicare Annual Wellness (AWV)  09/07/2025   Colonoscopy  10/05/2033   Pneumococcal Vaccine: 50+ Years  Completed   Influenza Vaccine  Completed   Hepatitis C Screening  Completed   Zoster Vaccines- Shingrix  Completed   Meningococcal B Vaccine  Aged Out   COVID-19 Vaccine  Discontinued        Assessment/Plan:  This is a routine wellness examination for Pearlie.  Patient Care Team: Joshua Debby CROME, MD as PCP - General (Internal Medicine) Fernande Elspeth BROCKS, MD (Inactive) as PCP - Electrophysiology (Cardiology) Reusing Selinda, OD as Referring  Physician (Optometry)  I have personally reviewed and noted the following in the patient's chart:   Medical and social history Use of alcohol, tobacco or illicit drugs  Current medications and supplements including opioid prescriptions.  Functional ability and status Nutritional status Physical activity Advanced directives List of other physicians Hospitalizations, surgeries, and ER visits in previous 12 months Vitals Screenings to include cognitive, depression, and falls Referrals and appointments  Orders Placed This Encounter  Procedures   DG Bone Density    Standing Status:   Future    Expiration Date:   09/07/2025    Reason for Exam (SYMPTOM  OR DIAGNOSIS REQUIRED):   Postmenopausal estrogen Deficiency    Preferred imaging location?:   [address]   In addition, I have reviewed and discussed with patient certain preventive protocols, quality metrics, and best practice recommendations. A written personalized care plan for preventive services as well as general preventive health recommendations were provided to patient.   Verdie CHRISTELLA Saba, CMA   09/07/2024   Return in 1 year (on 09/07/2025).  After Visit Summary: (Declined) Due to this being a telephonic visit, with patients personalized plan was offered to patient but patient Declined AVS at this time   Nurse Notes: Scheduled a 3-mth Diabetes f/u appt w/PCP for 09/2024.

## 2024-09-07 NOTE — Patient Instructions (Addendum)
 Stephanie [name],  Thank you for taking the time for your Medicare Wellness Visit. I appreciate your continued commitment to your health goals. Please review the care plan we discussed, and feel free to reach out if I can assist you further.  Please note that Annual Wellness Visits do not include a physical exam. Some assessments may be limited, especially if the visit was conducted virtually. If needed, we may recommend an in-person follow-up with your provider.  Ongoing Care Seeing your primary care provider every 3 to 6 months helps us  monitor your health and provide consistent, personalized care.  Referrals If a referral was made during today's visit and you haven't received any updates within two weeks, please contact the referred provider directly to check on the status.  Recommended Screenings:  Health Maintenance  Topic Date Due   Osteoporosis screening with Bone Density Scan  Never done   DTaP/Tdap/Td vaccine (2 - Td or Tdap) 08/05/2023   Complete foot exam   05/23/2024   Eye exam for diabetics  08/12/2024   Hemoglobin A1C  01/01/2025   Yearly kidney function blood test for diabetes  07/04/2025   Yearly kidney health urinalysis for diabetes  07/04/2025   Breast Cancer Screening  07/22/2025   Medicare Annual Wellness Visit  09/07/2025   Colon Cancer Screening  10/05/2033   Pneumococcal Vaccine for age over 66  Completed   Flu Shot  Completed   Hepatitis C Screening  Completed   Zoster (Shingles) Vaccine  Completed   Meningitis B Vaccine  Aged Out   COVID-19 Vaccine  Discontinued       09/07/2024    8:57 AM  Advanced Directives  Does Patient Have a Medical Advance Directive? No  Would patient like information on creating a medical advance directive? No - Patient declined    Vision: Annual vision screenings are recommended for early detection of glaucoma, cataracts, and diabetic retinopathy. These exams can also reveal signs of chronic conditions such as diabetes and high  blood pressure.  Dental: Annual dental screenings help detect early signs of oral cancer, gum disease, and other conditions linked to overall health, including heart disease and diabetes.

## 2024-10-05 ENCOUNTER — Ambulatory Visit: Admitting: Internal Medicine

## 2024-10-15 NOTE — Progress Notes (Unsigned)
 " Electrophysiology Office Note:   Date:  10/16/2024  ID:  Stephanie [name], DOB February 11, 1951, MRN [id number]  Primary Cardiologist: None Primary Heart Failure: None Electrophysiologist: Fonda Kitty, MD      History of Present Illness:   Stephanie [name] is a 73 y.o. female with h/o AF, cardiomyopathy, HFpEF, HTN, HLD, depression, sarcoidosis (dx by eye exam), hypothyroidism, DM II  seen today for routine electrophysiology followup.   Seen in EP Clinic 06/2024 and noted recent COVID s/p Paxlovid .  She did not tolerate Paxlovid  due to skin lesions.  She reported gait instability on amiodaorne.   Since last being seen in our clinic the patient reports she has been doing well. She is no longer staggering with walking.  She did not break the amiodarone  in half as last prescribed but has been taking a whole tablet 2-3x per week.  She has not had AF that she is aware of since last visit. She notes she over indulged for the holidays.  She denies chest pain, palpitations, dyspnea, PND, orthopnea, nausea, vomiting, dizziness, syncope, edema, weight gain, or early satiety.   Review of systems complete and found to be negative unless listed in HPI.   EP Information / Studies Reviewed:    EKG is ordered today. Personal review as below.  EKG Interpretation Date/Time:  Monday October 16 2024 13:27:27 EST Ventricular Rate:  54 PR Interval:  214 QRS Duration:  118 QT Interval:  488 QTC Calculation: 462 R Axis:   -9  Text Interpretation: Sinus bradycardia with 1st degree A-V block Left ventricular hypertrophy Nonspecific T wave abnormality Confirmed by Aniceto Jarvis ([id number]) on 10/16/2024 1:43:21 PM    Arrhythmia / AAD / Pertinent EP Studies AF  Amiodarone  10/2023 > pt temporarily stopped 07/11/24 due to balance issues  > resumed 06/2024 at reduced dose     Risk Assessment/Calculations:    CHA2DS2-VASc Score = 6   This indicates a 9.7% annual risk of stroke. The patient's score is based  upon: CHF History: 1 HTN History: 1 Diabetes History: 1 Stroke History: 0 Vascular Disease History: 1 Age Score: 1 Gender Score: 1    HYPERTENSION CONTROL Vitals:   10/16/24 1324 10/16/24 1335  BP: (!) 140/82 (!) 140/78    The patient's blood pressure is elevated above target today.  In order to address the patient's elevated BP: Blood pressure will be monitored at home to determine if medication changes need to be made.           Physical Exam:   VS:  BP (!) 140/78 (BP Location: Right Arm, Cuff Size: Large)   Pulse (!) 54   Ht 5' 3 (1.6 m)   Wt 255 lb 3.2 oz (115.8 kg)   SpO2 99%   BMI 45.21 kg/m    Wt Readings from Last 3 Encounters:  10/16/24 255 lb 3.2 oz (115.8 kg)  09/07/24 253 lb (114.8 kg)  07/14/24 253 lb (114.8 kg)     GEN: Well nourished, well developed in no acute distress NECK: No JVD; No carotid bruits CARDIAC: Regular rate and rhythm, no murmurs, rubs, gallops RESPIRATORY:  Clear to auscultation without rales, wheezing or rhonchi  ABDOMEN: Soft, non-tender, non-distended EXTREMITIES:  No edema; No deformity   ASSESSMENT AND PLAN:    Persistent Atrial Fibrillation  CHA2DS2-VASc 6  -OAC for stroke prophylaxis  -continue amiodarone  as patient has been taking > 200 mg MWF -balance issues have improved significantly with dose reduction of amio -plan for repeat amio labs in  6 months  -Toprol  50 gm daily  -EKG with NSR, stable intervals on amio -she previously was evaluated by Dr. Kelsie for ablation but did not want to pursue.  Discussed ablation concept and she indicates she is not interested   Secondary Hypercoagulable State  -continue Pradaxa    NICM  -euvolemic on exam  -GDMT per Cardiology   Hypertension  -elevated in clinic on re-check. Pt encouraged to monitor, discussed goal of <130/80  Sarcoidosis  Dx by eye exam  -per primary    Follow up with EP APP in 6 months  Signed, Daphne Barrack, NP-C, AGACNP-BC [facility] -  Electrophysiology  10/16/2024, 2:35 PM  "

## 2024-10-16 ENCOUNTER — Encounter: Payer: Self-pay | Admitting: Pulmonary Disease

## 2024-10-16 ENCOUNTER — Ambulatory Visit: Attending: Pulmonary Disease | Admitting: Pulmonary Disease

## 2024-10-16 VITALS — BP 140/78 | HR 54 | Ht 63.0 in | Wt 255.2 lb

## 2024-10-16 DIAGNOSIS — I428 Other cardiomyopathies: Secondary | ICD-10-CM | POA: Diagnosis not present

## 2024-10-16 DIAGNOSIS — I5032 Chronic diastolic (congestive) heart failure: Secondary | ICD-10-CM

## 2024-10-16 DIAGNOSIS — I10 Essential (primary) hypertension: Secondary | ICD-10-CM | POA: Diagnosis not present

## 2024-10-16 DIAGNOSIS — I4819 Other persistent atrial fibrillation: Secondary | ICD-10-CM

## 2024-10-16 DIAGNOSIS — D6869 Other thrombophilia: Secondary | ICD-10-CM | POA: Diagnosis not present

## 2024-10-16 MED ORDER — METOPROLOL SUCCINATE ER 50 MG PO TB24: 50.0000 mg | ORAL_TABLET | Freq: Every day | ORAL | 3 refills | Status: AC

## 2024-10-16 MED ORDER — AMIODARONE HCL 200 MG PO TABS: ORAL_TABLET | ORAL | Status: DC

## 2024-10-16 NOTE — Patient Instructions (Signed)
 Medication Instructions:  Take your amiodarone  200 mg (1 tablet) on Monday, Wednesday and Friday   *If you need a refill on your cardiac medications before your next appointment, please call your pharmacy*  Lab Work: No lab work today.  We will recheck your amiodarone  labs at your next visit.   If you have labs (blood work) drawn today and your tests are completely normal, you will receive your results only by: MyChart Message (if you have MyChart) OR A paper copy in the mail If you have any lab test that is abnormal or we need to change your treatment, we will call you to review the results.  Testing/Procedures: No testing/procedures were scheduled today  Follow-Up: At [facility], you and your health needs are our priority.  As part of our continuing mission to provide you with exceptional heart care, our providers are all part of one team.  This team includes your primary Cardiologist (physician) and Advanced Practice Providers or APPs (Physician Assistants and Nurse Practitioners) who all work together to provide you with the care you need, when you need it.  Your next appointment:   6 month(s)  Provider:   You may see Fonda Kitty, MD or one of the following Advanced Practice Providers on your designated Care Team:    Daphne Barrack, NP    We recommend signing up for the patient portal called MyChart.  Sign up information is provided on this After Visit Summary.  MyChart is used to connect with patients for Virtual Visits (Telemedicine).  Patients are able to view lab/test results, encounter notes, upcoming appointments, etc.  Non-urgent messages can be sent to your provider as well.   To learn more about what you can do with MyChart, go to [email or web address].

## 2024-11-08 ENCOUNTER — Ambulatory Visit: Admitting: Internal Medicine

## 2024-11-08 ENCOUNTER — Ambulatory Visit: Payer: Self-pay | Admitting: Internal Medicine

## 2024-11-08 ENCOUNTER — Encounter: Payer: Self-pay | Admitting: Internal Medicine

## 2024-11-08 VITALS — BP 212/102 | HR 67 | Temp 97.8°F | Ht 63.0 in | Wt 252.8 lb

## 2024-11-08 DIAGNOSIS — I5032 Chronic diastolic (congestive) heart failure: Secondary | ICD-10-CM | POA: Diagnosis not present

## 2024-11-08 DIAGNOSIS — I428 Other cardiomyopathies: Secondary | ICD-10-CM

## 2024-11-08 DIAGNOSIS — N1831 Chronic kidney disease, stage 3a: Secondary | ICD-10-CM | POA: Diagnosis not present

## 2024-11-08 DIAGNOSIS — E119 Type 2 diabetes mellitus without complications: Secondary | ICD-10-CM

## 2024-11-08 DIAGNOSIS — E1122 Type 2 diabetes mellitus with diabetic chronic kidney disease: Secondary | ICD-10-CM | POA: Diagnosis not present

## 2024-11-08 DIAGNOSIS — I160 Hypertensive urgency: Secondary | ICD-10-CM | POA: Diagnosis not present

## 2024-11-08 DIAGNOSIS — E538 Deficiency of other specified B group vitamins: Secondary | ICD-10-CM

## 2024-11-08 DIAGNOSIS — I429 Cardiomyopathy, unspecified: Secondary | ICD-10-CM

## 2024-11-08 LAB — URINALYSIS, ROUTINE W REFLEX MICROSCOPIC
Bilirubin Urine: NEGATIVE
Ketones, ur: NEGATIVE
Leukocytes,Ua: NEGATIVE
Nitrite: NEGATIVE
Specific Gravity, Urine: 1.02 (ref 1.000–1.030)
Total Protein, Urine: NEGATIVE
Urine Glucose: >=1000 — AB
Urobilinogen, UA: 0.2 (ref 0.0–1.0)
pH: 5.5 (ref 5.0–8.0)

## 2024-11-08 LAB — CBC WITH DIFFERENTIAL/PLATELET
Basophils Absolute: 0 K/uL (ref 0.0–0.1)
Basophils Relative: 0.5 % (ref 0.0–3.0)
Eosinophils Absolute: 0.1 K/uL (ref 0.0–0.7)
Eosinophils Relative: 1.7 % (ref 0.0–5.0)
HCT: 36.2 % (ref 36.0–46.0)
Hemoglobin: 11.9 g/dL — ABNORMAL LOW (ref 12.0–15.0)
Lymphocytes Relative: 39.4 % (ref 12.0–46.0)
Lymphs Abs: 1.8 K/uL (ref 0.7–4.0)
MCHC: 32.8 g/dL (ref 30.0–36.0)
MCV: 93.1 fl (ref 78.0–100.0)
Monocytes Absolute: 0.4 K/uL (ref 0.1–1.0)
Monocytes Relative: 7.9 % (ref 3.0–12.0)
Neutro Abs: 2.4 K/uL (ref 1.4–7.7)
Neutrophils Relative %: 50.5 % (ref 43.0–77.0)
Platelets: 191 K/uL (ref 150.0–400.0)
RBC: 3.89 Mil/uL (ref 3.87–5.11)
RDW: 14.6 % (ref 11.5–15.5)
WBC: 4.7 K/uL (ref 4.0–10.5)

## 2024-11-08 LAB — BASIC METABOLIC PANEL WITH GFR
BUN: 21 mg/dL (ref 6–23)
CO2: 29 meq/L (ref 19–32)
Calcium: 9.9 mg/dL (ref 8.4–10.5)
Chloride: 104 meq/L (ref 96–112)
Creatinine, Ser: 1.16 mg/dL (ref 0.40–1.20)
GFR: 47.14 mL/min — ABNORMAL LOW
Glucose, Bld: 111 mg/dL — ABNORMAL HIGH (ref 70–99)
Potassium: 3.7 meq/L (ref 3.5–5.1)
Sodium: 139 meq/L (ref 135–145)

## 2024-11-08 LAB — FOLATE: Folate: 17.2 ng/mL

## 2024-11-08 LAB — VITAMIN B12: Vitamin B-12: 254 pg/mL (ref 211–911)

## 2024-11-08 LAB — HEMOGLOBIN A1C: Hgb A1c MFr Bld: 5.7 % (ref 4.6–6.5)

## 2024-11-08 LAB — BRAIN NATRIURETIC PEPTIDE: Pro B Natriuretic peptide (BNP): 99 pg/mL (ref 1.0–100.0)

## 2024-11-08 LAB — TROPONIN I (HIGH SENSITIVITY): High Sens Troponin I: 14 ng/L (ref 2–17)

## 2024-11-08 NOTE — Progress Notes (Unsigned)
 "  Subjective:  Patient ID: Stephanie [name], female    DOB: [birth year]  Age: 74 y.o. MRN: [id number]  CC: Diabetes (3 month follow up )   HPI Stephanie [name] presents for f/up ---  Discussed the use of AI scribe software for clinical note transcription with the patient, who gave verbal consent to proceed.  History of Present Illness Stephanie [name] is a 74 year old female who presents with foul-smelling urine, back pain, and high blood pressure.  She has been experiencing foul-smelling urine and back pain. Additionally, she notes excessive sweating and difficulty sleeping due to burning eye pain. She has been taking Xanax , two tablets, but finds it ineffective for sleep.  For about a week, she has been experiencing headaches and staggering. She was unaware of her high blood pressure until it was measured at 212/102, which she describes as unusual. She attributes potential causes to stress from a recent family crisis, which required her to travel out of town and miss a previous appointment.  She has not had an eye exam in the past year. She mentions a black toenail and tingling sensations, initially thought to be related to her heart. She experiences occasional leg pain when walking.  She has been missing some of her medications, including her blood thinner and blood pressure pills, which she sometimes takes only once a day instead of twice. There was a delay in receiving her blood pressure medication, and she was not taking it for about a week.  No recent vomiting, but she mentions occasional stomach discomfort.     Outpatient Medications Prior to Visit  Medication Sig Dispense Refill   Accu-Chek FastClix Lancets MISC      allopurinol  (ZYLOPRIM ) 100 MG tablet TAKE 1/2 TABLET(50 MG) BY MOUTH TWICE DAILY (Patient taking differently: Take 100 mg by mouth daily.) 90 tablet 3   ALPRAZolam  (XANAX ) 0.5 MG tablet Take 1 tablet (0.5 mg total) by mouth 3 (three) times daily as needed for anxiety.  270 tablet 0   amiodarone  (PACERONE ) 200 MG tablet Take 1 tablet on Monday / Wednesday / Friday     Blood Pressure KIT 1 kit by Does not apply route daily. Check blood pressure once a day 1 kit 0   colchicine  0.6 MG tablet Take 1 tablet (0.6 mg total) by mouth daily. (Patient taking differently: Take 0.6 mg by mouth daily as needed.) 90 tablet 0   dabigatran  (PRADAXA ) 150 MG CAPS capsule TAKE 1 CAPSULE(150 MG) BY MOUTH TWICE DAILY 180 capsule 1   JARDIANCE  25 MG TABS tablet TAKE 1 TABLET(25 MG) BY MOUTH DAILY 90 tablet 1   KERENDIA  20 MG TABS TAKE 1 TABLET(20 MG) BY MOUTH DAILY 90 tablet 0   metoprolol  succinate (TOPROL -XL) 50 MG 24 hr tablet Take 1 tablet (50 mg total) by mouth daily. 90 tablet 3   polyethylene glycol (MIRALAX  / GLYCOLAX ) 17 g packet Take 17 g by mouth 2 (two) times daily. Titrate as needed 60 packet 3   sacubitril -valsartan  (ENTRESTO ) 24-26 MG TAKE 1 TABLET BY MOUTH TWICE DAILY 180 tablet 3   simvastatin  (ZOCOR ) 10 MG tablet TAKE 1 TABLET(10 MG) BY MOUTH AT BEDTIME 90 tablet 0   terbinafine (LAMISIL) 1 % cream Apply 1 Application topically 2 (two) times daily as needed (skin irritation/itching.).     tirzepatide  (MOUNJARO ) 5 MG/0.5ML Pen Inject 5 mg into the skin once a week. 6 mL 0   triamcinolone  cream (KENALOG ) 0.1 % Apply 1 Application topically 2 (two) times daily. (  Patient taking differently: Apply 1 Application topically 2 (two) times daily. As needed) 30 g 0   furosemide  (LASIX ) 40 MG tablet Take 1 tablet (40 mg total) by mouth daily. (Patient taking differently: Take 40 mg by mouth daily. prn) 90 tablet 1   No facility-administered medications prior to visit.    ROS Review of Systems  Constitutional:  Negative for appetite change, chills, diaphoresis, fatigue and fever.  HENT: Negative.    Eyes:  Positive for pain. Negative for visual disturbance.  Respiratory: Negative.  Negative for cough, chest tightness, shortness of breath and wheezing.   Cardiovascular:   Negative for chest pain, palpitations and leg swelling.  Gastrointestinal: Negative.  Negative for abdominal pain, constipation, diarrhea, nausea and vomiting.  Genitourinary:  Positive for dysuria. Negative for decreased urine volume, difficulty urinating, hematuria and urgency.  Musculoskeletal:  Positive for gait problem. Negative for arthralgias and myalgias.  Neurological:  Positive for weakness and headaches. Negative for dizziness, light-headedness and numbness.  Hematological:  Negative for adenopathy. Does not bruise/bleed easily.  Psychiatric/Behavioral:  Positive for confusion, decreased concentration, dysphoric mood and sleep disturbance. Negative for agitation, behavioral problems, hallucinations and suicidal ideas. The patient is nervous/anxious.     Objective:  BP (!) 212/102 (BP Location: Left Arm, Patient Position: Sitting, Cuff Size: Normal)   Pulse 67   Temp 97.8 F (36.6 C) (Oral)   Ht 5' 3 (1.6 m)   Wt 252 lb 12.8 oz (114.7 kg)   SpO2 94%   BMI 44.78 kg/m   BP Readings from Last 3 Encounters:  11/08/24 (!) 212/102  10/16/24 (!) 140/78  07/14/24 138/62    Wt Readings from Last 3 Encounters:  11/08/24 252 lb 12.8 oz (114.7 kg)  10/16/24 255 lb 3.2 oz (115.8 kg)  09/07/24 253 lb (114.8 kg)    Physical Exam Vitals reviewed.  Constitutional:      General: She is not in acute distress.    Appearance: She is ill-appearing. She is not toxic-appearing or diaphoretic.  HENT:     Nose: Nose normal.     Mouth/Throat:     Mouth: Mucous membranes are moist.  Eyes:     General: No scleral icterus.    Conjunctiva/sclera: Conjunctivae normal.  Cardiovascular:     Rate and Rhythm: Bradycardia present.     Heart sounds: No murmur heard.    No friction rub. No gallop.     Comments: EKG- SB with 1st degree AV block, 58 bpm LVH with wide QRS No Q waves or ST/T wave changes Pulmonary:     Effort: Pulmonary effort is normal.     Breath sounds: No stridor. No  wheezing, rhonchi or rales.  Abdominal:     General: Abdomen is flat.     Palpations: There is no mass.     Tenderness: There is no abdominal tenderness. There is no guarding.     Hernia: No hernia is present.  Musculoskeletal:        General: No swelling.     Cervical back: Neck supple.     Right lower leg: No edema.     Left lower leg: No edema.  Lymphadenopathy:     Cervical: No cervical adenopathy.  Skin:    General: Skin is warm and dry.  Neurological:     General: No focal deficit present.     Mental Status: She is alert.  Psychiatric:        Mood and Affect: Mood normal.  Behavior: Behavior normal.     Lab Results  Component Value Date   WBC 4.7 11/08/2024   HGB 11.9 (L) 11/08/2024   HCT 36.2 11/08/2024   PLT 191.0 11/08/2024   GLUCOSE 111 (H) 11/08/2024   CHOL 184 07/04/2024   TRIG 71.0 07/04/2024   HDL 60.10 07/04/2024   LDLCALC 109 (H) 07/04/2024   ALT 9 07/04/2024   AST 14 07/04/2024   NA 139 11/08/2024   K 3.7 11/08/2024   CL 104 11/08/2024   CREATININE 1.16 11/08/2024   BUN 21 11/08/2024   CO2 29 11/08/2024   TSH 2.32 07/04/2024   INR 1.1 04/26/2024   HGBA1C 5.7 11/08/2024   MICROALBUR 1.2 07/04/2024   Estimated Creatinine Clearance: 53.5 mL/min (by C-G formula based on SCr of 1.16 mg/dL).   Assessment & Plan:   Malignant hypertensive urgency- She was not willing to go to the ED. Will try to achieve better BP control. -     EKG 12-Lead -     Basic metabolic panel with GFR; Future -     Urinalysis, Routine w reflex microscopic; Future -     AMB Referral [facility]BCI Care Management -     Torsemide ; Take 1 tablet (20 mg total) by mouth 2 (two) times daily.  Dispense: 180 tablet; Refill: 0  Type 2 diabetes mellitus with stage 3b chronic kidney disease, without long-term current use of insulin  (HCC)- Blood sugar is well controlled. -     Basic metabolic panel with GFR; Future -     Hemoglobin A1c; Future -     HM Diabetes Foot Exam  Chronic  diastolic heart failure (HCC)- No signs of fluid excess. -     Basic metabolic panel with GFR; Future -     Troponin I (High Sensitivity); Future -     Brain natriuretic peptide; Future -     Torsemide ; Take 1 tablet (20 mg total) by mouth 2 (two) times daily.  Dispense: 180 tablet; Refill: 0  Cardiomyopathy, secondary (HCC) -     Troponin I (High Sensitivity); Future -     Brain natriuretic peptide; Future -     Torsemide ; Take 1 tablet (20 mg total) by mouth 2 (two) times daily.  Dispense: 180 tablet; Refill: 0 -     Ambulatory referral to Cardiology  B12 deficiency -     CBC with Differential/Platelet; Future -     Vitamin B12; Future -     Folate; Future  NICM (nonischemic cardiomyopathy) (HCC) -     Troponin I (High Sensitivity); Future -     Brain natriuretic peptide; Future -     Torsemide ; Take 1 tablet (20 mg total) by mouth 2 (two) times daily.  Dispense: 180 tablet; Refill: 0 -     Ambulatory referral to Cardiology  CKD stage 3a, GFR 45-59 ml/min (HCC)- Will avoid nephrotoxic agents       Estimated Creatinine Clearance: 53.5 mL/min (by C-G formula based on SCr of 1.16 mg/dL).   Follow-up: Return in about 4 weeks (around 12/06/2024).  Debby Molt, MD "

## 2024-11-08 NOTE — Patient Instructions (Signed)
 Hypertension, Adult High blood pressure (hypertension) is when the force of blood pumping through the arteries is too strong. The arteries are the blood vessels that carry blood from the heart throughout the body. Hypertension forces the heart to work harder to pump blood and may cause arteries to become narrow or stiff. Untreated or uncontrolled hypertension can lead to a heart attack, heart failure, a stroke, kidney disease, and other problems. A blood pressure reading consists of a higher number over a lower number. Ideally, your blood pressure should be below 120/80. The first ("top") number is called the systolic pressure. It is a measure of the pressure in your arteries as your heart beats. The second ("bottom") number is called the diastolic pressure. It is a measure of the pressure in your arteries as the heart relaxes. What are the causes? The exact cause of this condition is not known. There are some conditions that result in high blood pressure. What increases the risk? Certain factors may make you more likely to develop high blood pressure. Some of these risk factors are under your control, including: Smoking. Not getting enough exercise or physical activity. Being overweight. Having too much fat, sugar, calories, or salt (sodium) in your diet. Drinking too much alcohol. Other risk factors include: Having a personal history of heart disease, diabetes, high cholesterol, or kidney disease. Stress. Having a family history of high blood pressure and high cholesterol. Having obstructive sleep apnea. Age. The risk increases with age. What are the signs or symptoms? High blood pressure may not cause symptoms. Very high blood pressure (hypertensive crisis) may cause: Headache. Fast or irregular heartbeats (palpitations). Shortness of breath. Nosebleed. Nausea and vomiting. Vision changes. Severe chest pain, dizziness, and seizures. How is this diagnosed? This condition is diagnosed by  measuring your blood pressure while you are seated, with your arm resting on a flat surface, your legs uncrossed, and your feet flat on the floor. The cuff of the blood pressure monitor will be placed directly against the skin of your upper arm at the level of your heart. Blood pressure should be measured at least twice using the same arm. Certain conditions can cause a difference in blood pressure between your right and left arms. If you have a high blood pressure reading during one visit or you have normal blood pressure with other risk factors, you may be asked to: Return on a different day to have your blood pressure checked again. Monitor your blood pressure at home for 1 week or longer. If you are diagnosed with hypertension, you may have other blood or imaging tests to help your health care provider understand your overall risk for other conditions. How is this treated? This condition is treated by making healthy lifestyle changes, such as eating healthy foods, exercising more, and reducing your alcohol intake. You may be referred for counseling on a healthy diet and physical activity. Your health care provider may prescribe medicine if lifestyle changes are not enough to get your blood pressure under control and if: Your systolic blood pressure is above 130. Your diastolic blood pressure is above 80. Your personal target blood pressure may vary depending on your medical conditions, your age, and other factors. Follow these instructions at home: Eating and drinking  Eat a diet that is high in fiber and potassium, and low in sodium, added sugar, and fat. An example of this eating plan is called the DASH diet. DASH stands for Dietary Approaches to Stop Hypertension. To eat this way: Eat  plenty of fresh fruits and vegetables. Try to fill one half of your plate at each meal with fruits and vegetables. Eat whole grains, such as whole-wheat pasta, brown rice, or whole-grain bread. Fill about one  fourth of your plate with whole grains. Eat or drink low-fat dairy products, such as skim milk or low-fat yogurt. Avoid fatty cuts of meat, processed or cured meats, and poultry with skin. Fill about one fourth of your plate with lean proteins, such as fish, chicken without skin, beans, eggs, or tofu. Avoid pre-made and processed foods. These tend to be higher in sodium, added sugar, and fat. Reduce your daily sodium intake. Many people with hypertension should eat less than 1,500 mg of sodium a day. Do not drink alcohol if: Your health care provider tells you not to drink. You are pregnant, may be pregnant, or are planning to become pregnant. If you drink alcohol: Limit how much you have to: 0-1 drink a day for women. 0-2 drinks a day for men. Know how much alcohol is in your drink. In the [place] one drink equals one 12 oz bottle of beer (355 mL), one 5 oz glass of wine (148 mL), or one 1 oz glass of hard liquor (44 mL). Lifestyle  Work with your health care provider to maintain a healthy body weight or to lose weight. Ask what an ideal weight is for you. Get at least 30 minutes of exercise that causes your heart to beat faster (aerobic exercise) most days of the week. Activities may include walking, swimming, or biking. Include exercise to strengthen your muscles (resistance exercise), such as Pilates or lifting weights, as part of your weekly exercise routine. Try to do these types of exercises for 30 minutes at least 3 days a week. Do not use any products that contain nicotine or tobacco. These products include cigarettes, chewing tobacco, and vaping devices, such as e-cigarettes. If you need help quitting, ask your health care provider. Monitor your blood pressure at home as told by your health care provider. Keep all follow-up visits. This is important. Medicines Take over-the-counter and prescription medicines only as told by your health care provider. Follow directions carefully. Blood  pressure medicines must be taken as prescribed. Do not skip doses of blood pressure medicine. Doing this puts you at risk for problems and can make the medicine less effective. Ask your health care provider about side effects or reactions to medicines that you should watch for. Contact a health care provider if you: Think you are having a reaction to a medicine you are taking. Have headaches that keep coming back (recurring). Feel dizzy. Have swelling in your ankles. Have trouble with your vision. Get help right away if you: Develop a severe headache or confusion. Have unusual weakness or numbness. Feel faint. Have severe pain in your chest or abdomen. Vomit repeatedly. Have trouble breathing. These symptoms may be an emergency. Get help right away. Call 911. Do not wait to see if the symptoms will go away. Do not drive yourself to the hospital. Summary Hypertension is when the force of blood pumping through your arteries is too strong. If this condition is not controlled, it may put you at risk for serious complications. Your personal target blood pressure may vary depending on your medical conditions, your age, and other factors. For most people, a normal blood pressure is less than 120/80. Hypertension is treated with lifestyle changes, medicines, or a combination of both. Lifestyle changes include losing weight, eating a healthy,  low-sodium diet, exercising more, and limiting alcohol. This information is not intended to replace advice given to you by your health care provider. Make sure you discuss any questions you have with your health care provider. Document Revised: 08/12/2021 Document Reviewed: 08/12/2021 Elsevier Patient Education  2024 [employer].

## 2024-11-09 ENCOUNTER — Telehealth: Payer: Self-pay | Admitting: Internal Medicine

## 2024-11-09 MED ORDER — TORSEMIDE 20 MG PO TABS: 20.0000 mg | ORAL_TABLET | Freq: Two times a day (BID) | ORAL | 0 refills | Status: AC

## 2024-11-09 NOTE — Progress Notes (Signed)
 Care Guide Pharmacy Note  11/09/2024 Name: Stephanie [name] MRN: [id number] DOB: Feb 26, 1951  Referred By: Joshua Debby CROME, MD Reason for referral: Call Attempt #1 and Complex Care Management (Outreach to sch ref w/ pharm/)   Stephanie [name] is a 74 y.o. year old female who is a primary care patient of Joshua Debby CROME, MD.  Stephanie [name] was referred to the pharmacist for assistance related to: HTN  Successful contact was made with the patient to discuss pharmacy services including being ready for the pharmacist to call at least 5 minutes before the scheduled appointment time and to have medication bottles and any blood pressure readings ready for review. The patient agreed to meet with the pharmacist via telephone visit on 11/21/2024.  Doyce Razor [facility], [facility] Guide Direct Dial: [phone]  Fax: [phone]

## 2024-11-16 ENCOUNTER — Other Ambulatory Visit: Payer: Self-pay | Admitting: Internal Medicine

## 2024-11-16 DIAGNOSIS — E119 Type 2 diabetes mellitus without complications: Secondary | ICD-10-CM

## 2024-11-16 NOTE — Progress Notes (Signed)
 " Cardiology Office Note:   Date:  11/17/2024  ID:  Stephanie [name], DOB [birth year], MRN [id number] PCP: Joshua Debby CROME, MD  [facility] Providers Cardiologist:  None Electrophysiologist:  Fonda Kitty, MD {  History of Present Illness:   Stephanie [name] is a 74 y.o. female with h/o AF, cardiomyopathy, HFpEF, HTN, HLD, depression, sarcoidosis (dx by eye exam), hypothyroidism, DM II  seen today for follow up.  She was previously seen by Dr. Fernande.   She saw her PCP the other day and her BP was very elevated.  She was referred back to us .  She has had some headaches.  She has had some tingling in her feet and hands.  She has not had any new shortness of breath, PND or orthopnea.  She has not had any new palpitations, presyncope or syncope.  There is been no symptomatic paroxysms of atrial fibrillation.  She has been trying to exert herself.  She lives alone and she walks upstairs.  She does not bring on any symptoms with this.   ROS: As stated in the HPI and negative for all other systems.  Studies Reviewed:    EKG:     NA  Risk Assessment/Calculations:    CHA2DS2-VASc Score = 6   This indicates a 9.7% annual risk of stroke. The patient's score is based upon: CHF History: 1 HTN History: 1 Diabetes History: 1 Stroke History: 0 Vascular Disease History: 1 Age Score: 1 Gender Score: 1   Physical Exam:   VS:  Pulse (!) 58   Ht 5' 3 (1.6 m)   Wt 252 lb 6.4 oz (114.5 kg)   SpO2 93%   BMI 44.71 kg/m    Wt Readings from Last 3 Encounters:  11/17/24 252 lb 6.4 oz (114.5 kg)  11/08/24 252 lb 12.8 oz (114.7 kg)  10/16/24 255 lb 3.2 oz (115.8 kg)     GEN: Well nourished, well developed in no acute distress NECK: No JVD; No carotid bruits CARDIAC: RRR, no murmurs, rubs, gallops RESPIRATORY:  Clear to auscultation without rales, wheezing or rhonchi  ABDOMEN: Soft, non-tender, non-distended EXTREMITIES:  No edema; No deformity   ASSESSMENT AND PLAN:   Persistent Atrial  Fibrillation :   She is in sinus rhythm.  She actually has not been taking her amiodarone .  She thought it was making her stagger.  I think it is okay to be off of it for now though she is likely to have recurrent fibrillation in the future.  For now she will continue the metoprolol .  She will let us  know if she has any tachypalpitations going forward.  She has previously talked to EP about ablation but she did not pursue this.   Secondary Hypercoagulable State : She tolerates anticoagulation.  No change in therapy.   NICM :    The EF was 45 - 50%.  The EF was assessed with echo in February of last year.  She seems to be euvolemic on exam.  Meds will be changed as below.   Hypertension : I am going to increase her Entresto  to 49/51 twice daily.  She will get a basic metabolic profile in about 2 weeks.  I like to see her back in about a month for further med titration.   Sarcoidosis : Since this was diagnosed by eye exam in the past I suggested follow-up with an ophthalmologist.       Follow up with me or APP in 1 month.  Signed, Lynwood Schilling,  MD      "

## 2024-11-17 ENCOUNTER — Ambulatory Visit: Attending: Cardiology | Admitting: Cardiology

## 2024-11-17 ENCOUNTER — Encounter: Payer: Self-pay | Admitting: Cardiology

## 2024-11-17 VITALS — HR 58 | Ht 63.0 in | Wt 252.4 lb

## 2024-11-17 DIAGNOSIS — I10 Essential (primary) hypertension: Secondary | ICD-10-CM | POA: Diagnosis not present

## 2024-11-17 DIAGNOSIS — I428 Other cardiomyopathies: Secondary | ICD-10-CM | POA: Diagnosis not present

## 2024-11-17 DIAGNOSIS — Z79899 Other long term (current) drug therapy: Secondary | ICD-10-CM | POA: Diagnosis not present

## 2024-11-17 DIAGNOSIS — I4819 Other persistent atrial fibrillation: Secondary | ICD-10-CM

## 2024-11-17 MED ORDER — DABIGATRAN ETEXILATE MESYLATE 150 MG PO CAPS: 150.0000 mg | ORAL_CAPSULE | Freq: Two times a day (BID) | ORAL | 3 refills | Status: AC

## 2024-11-17 MED ORDER — SACUBITRIL-VALSARTAN 49-51 MG PO TABS: 1.0000 | ORAL_TABLET | Freq: Two times a day (BID) | ORAL | 3 refills | Status: AC

## 2024-11-17 NOTE — Patient Instructions (Signed)
 Medication Instructions:  Increase Entresto  to 49-51 mg twice daily Refilled Pradaxa  *If you need a refill on your cardiac medications before your next appointment, please call your pharmacy*  Lab Work: BMET in 2 weeks at [facility] If you have labs (blood work) drawn today and your tests are completely normal, you will receive your results only by: MyChart Message (if you have MyChart) OR A paper copy in the mail If you have any lab test that is abnormal or we need to change your treatment, we will call you to review the results.  Testing/Procedures: NONE  Follow-Up: At [facility], you and your health needs are our priority.  As part of our continuing mission to provide you with exceptional heart care, our providers are all part of one team.  This team includes your primary Cardiologist (physician) and Advanced Practice Providers or APPs (Physician Assistants and Nurse Practitioners) who all work together to provide you with the care you need, when you need it.  Your next appointment:   1 month(s)  Provider:   [place] or APP  We recommend signing up for the patient portal called MyChart.  Sign up information is provided on this After Visit Summary.  MyChart is used to connect with patients for Virtual Visits (Telemedicine).  Patients are able to view lab/test results, encounter notes, upcoming appointments, etc.  Non-urgent messages can be sent to your provider as well.   To learn more about what you can do with MyChart, go to [email or web address].

## 2024-11-21 ENCOUNTER — Other Ambulatory Visit

## 2024-11-21 VITALS — BP 165/70

## 2024-11-21 DIAGNOSIS — I10 Essential (primary) hypertension: Secondary | ICD-10-CM

## 2024-11-21 NOTE — Progress Notes (Unsigned)
 "   11/21/2024 Name: Beatric Fulop MRN: [id number] DOB: Oct 02, 1951  Chief Complaint  Patient presents with   Hypertension   Medication Management   Lemma Tetro is a 74 y.o. year old female who presented for a telephone visit.   They were referred to the pharmacist by their PCP for assistance in managing atrial fibrillation and hypertension.   Subjective:  Care Team: Primary Care Provider: Joshua Debby CROME, MD ; Next Scheduled Visit: 10/09/25 Cardiologist: Fernande Elspeth BROCKS, MD; Next Scheduled Visit: 12/15/24  Valene Access/Adherence  Current Pharmacy:  [id number] [address], [place] - [phone] AT [facility] OF [facility] & [address] [place] [address] Phone: [phone] Fax: [phone]  [facility], [place] (New Address) - [place], [place] - [address] [facility] AT Previously: [address], [place] [address] [facility] [address] [facility] [address] [place] [address] Phone: [phone] Fax: [phone]   Patient reports affordability concerns with their medications: No  Patient reports access/transportation concerns to their pharmacy: Yes  Patient reports adherence concerns with their medications:  Yes     Hypertension/Heart Failure (EF 45-50%): Pt reports she is feeling better than previous - no longer has a headache, eye pain, blurry vision. She had increased Entresto  to 2 tablet twice daily while she waits to get the increased strength from the pharmacy. She notes she goes in 2 weeks to get labs done per cardio.  Current medications:  ACEi/ARB/ARNI:  Entresto  50-51 mg BID SGLT2i: Jardiance  25mg  daily  Beta blocker: Metoprolol  Succinate 50mg  daily  Mineralocorticoid Receptor Antagonist: Spironolactone  was discontinued due to hyperkalemia and increased serum creatinine   Diuretic regimen: Furosemide  40mg  daily   Current home blood pressure readings: BP today at home 165/70. She  thinks it is lower than this and wonders if her BP monitor is wrong however she is just going off of how she feels.  Current medication access support: None   Objective:      BP Readings from Last 1 Encounters:  11/08/24 (!) 212/102    Lab Results  Component Value Date   HGBA1C 5.7 11/08/2024    Lab Results  Component Value Date   CREATININE 1.16 11/08/2024   BUN 21 11/08/2024   NA 139 11/08/2024   K 3.7 11/08/2024   CL 104 11/08/2024   CO2 29 11/08/2024    Lab Results  Component Value Date   CHOL 184 07/04/2024   HDL 60.10 07/04/2024   LDLCALC 109 (H) 07/04/2024   TRIG 71.0 07/04/2024   CHOLHDL 3 07/04/2024    Medications Reviewed Today     Reviewed by Merceda Lela SAUNDERS, RPH-CPP (Pharmacist) on 11/21/24 at 1638  Med List Status: <None>   Medication Order Taking? Sig Documenting Provider Last Dose Status Informant  Accu-Chek FastClix Lancets MISC [id number] Yes  [provider]  Active Self  allopurinol  (ZYLOPRIM ) 100 MG tablet [id number]  TAKE 1/2 TABLET(50 MG) BY MOUTH TWICE DAILY  Patient not taking: Reported on 11/17/2024   Corey, Evan S, MD  Active Self  ALPRAZolam  (XANAX ) 0.5 MG tablet [id number] Yes Take 1 tablet (0.5 mg total) by mouth 3 (three) times daily as needed for anxiety. Norleen Lynwood ORN, MD  Active   Blood Pressure KIT [id number] Yes 1 kit by Does not apply route daily. Check blood pressure once a day Fernande Elspeth BROCKS, MD  Active Self  colchicine  0.6 MG tablet [id number]  Take 1 tablet (0.6  mg total) by mouth daily.  Patient taking differently: Take 0.6 mg by mouth daily as needed.   Joshua Debby CROME, MD  Active Self  dabigatran  (PRADAXA ) 150 MG CAPS capsule [id number]  Take 1 capsule (150 mg total) by mouth 2 (two) times daily.  Patient not taking: Reported on 11/21/2024   Lavona Agent, MD  Active   JARDIANCE  25 MG TABS tablet [id number] Yes TAKE 1 TABLET(25 MG) BY MOUTH DAILY Joshua Debby CROME, MD  Active   KERENDIA  20 MG TABS [id number] Yes TAKE 1  TABLET(20 MG) BY MOUTH DAILY Joshua Debby CROME, MD  Active   metoprolol  succinate (TOPROL -XL) 50 MG 24 hr tablet [id number] Yes Take 1 tablet (50 mg total) by mouth daily. Aniceto Daphne CROME, NP  Active   polyethylene glycol (MIRALAX  / GLYCOLAX ) 17 g packet [id number]  Take 17 g by mouth 2 (two) times daily. Titrate as needed  Patient not taking: Reported on 11/17/2024   Leigh Elspeth SQUIBB, MD  Active Self  sacubitril -valsartan  (ENTRESTO ) 49-51 MG [id number] Yes Take 1 tablet by mouth 2 (two) times daily. Lavona Agent, MD  Active   simvastatin  (ZOCOR ) 10 MG tablet [id number] Yes TAKE 1 TABLET(10 MG) BY MOUTH AT BEDTIME Joshua Debby CROME, MD  Active   terbinafine (LAMISIL) 1 % cream [id number]  Apply 1 Application topically 2 (two) times daily as needed (skin irritation/itching.).  Patient not taking: Reported on 11/17/2024   [provider]  Active Self  tirzepatide  (MOUNJARO ) 5 MG/0.5ML Pen [id number] Yes Inject 5 mg into the skin once a week. Joshua Debby CROME, MD  Active Self           Med Note JACKOLYN, NY'AVIEA L   Thu Jan 27, 2024 11:16 AM)    torsemide  (DEMADEX ) 20 MG tablet [id number]  Take 1 tablet (20 mg total) by mouth 2 (two) times daily.  Patient not taking: Reported on 11/17/2024   Joshua Debby CROME, MD  Active   triamcinolone  cream (KENALOG ) 0.1 % [id number] Yes Apply 1 Application topically 2 (two) times daily. Darral Longs, MD  Active               Assessment/Plan:  Hypertension/Heart failure: - Currently uncontrolled, goal <130/80 mmHg - Reviewed long term cardiovascular and renal outcomes of uncontrolled blood pressure - Recommend to monitor blood pressure at least twice daily  - Continue increased Entresto  and follow up with cardio  Follow up: 2/19   Darrelyn Drum, PharmD, BCPS, CPP Clinical Pharmacist Practitioner [facility] [phone]    "

## 2024-11-22 NOTE — Patient Instructions (Addendum)
 It was a pleasure speaking with you today!  Continue increased Entresto  dose and check blood pressure at home.  I will check back with you in 2 weeks.  Feel free to call with any questions or concerns!  Darrelyn Drum, PharmD, BCPS, CPP Clinical Pharmacist Practitioner [facility] [phone]

## 2024-12-07 ENCOUNTER — Other Ambulatory Visit

## 2024-12-15 ENCOUNTER — Ambulatory Visit: Admitting: Cardiology

## 2025-10-09 ENCOUNTER — Encounter: Admitting: Internal Medicine

## 2025-10-09 ENCOUNTER — Ambulatory Visit
